# Patient Record
Sex: Male | Born: 1960 | Race: Black or African American | Hispanic: No | Marital: Single | Smoking: Former smoker
Health system: Southern US, Community
[De-identification: ages and names within clinical notes are randomized; demographics above are authoritative.]

## PROBLEM LIST (undated history)

## (undated) DIAGNOSIS — Z8719 Personal history of other diseases of the digestive system: Secondary | ICD-10-CM

## (undated) DIAGNOSIS — E663 Overweight: Secondary | ICD-10-CM

## (undated) DIAGNOSIS — T7840XA Allergy, unspecified, initial encounter: Secondary | ICD-10-CM

## (undated) DIAGNOSIS — M109 Gout, unspecified: Secondary | ICD-10-CM

## (undated) DIAGNOSIS — I2699 Other pulmonary embolism without acute cor pulmonale: Secondary | ICD-10-CM

## (undated) DIAGNOSIS — M1990 Unspecified osteoarthritis, unspecified site: Secondary | ICD-10-CM

## (undated) DIAGNOSIS — I82409 Acute embolism and thrombosis of unspecified deep veins of unspecified lower extremity: Secondary | ICD-10-CM

## (undated) DIAGNOSIS — K5792 Diverticulitis of intestine, part unspecified, without perforation or abscess without bleeding: Secondary | ICD-10-CM

## (undated) HISTORY — PX: HEMORRHOID SURGERY: SHX153

## (undated) HISTORY — DX: Diverticulitis of intestine, part unspecified, without perforation or abscess without bleeding: K57.92

## (undated) HISTORY — DX: Unspecified osteoarthritis, unspecified site: M19.90

## (undated) HISTORY — DX: Allergy, unspecified, initial encounter: T78.40XA

## (undated) HISTORY — DX: Gout, unspecified: M10.9

## (undated) HISTORY — PX: FOOT ARTHROPLASTY: SHX1657

## (undated) HISTORY — PX: OTHER SURGICAL HISTORY: SHX169

---
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
## 1998-06-25 ENCOUNTER — Emergency Department (HOSPITAL_COMMUNITY): Admission: EM | Admit: 1998-06-25 | Discharge: 1998-06-25 | Payer: Self-pay | Admitting: Emergency Medicine

## 1999-01-12 ENCOUNTER — Emergency Department (HOSPITAL_COMMUNITY): Admission: EM | Admit: 1999-01-12 | Discharge: 1999-01-12 | Payer: Self-pay

## 2001-11-07 ENCOUNTER — Emergency Department (HOSPITAL_COMMUNITY): Admission: EM | Admit: 2001-11-07 | Discharge: 2001-11-07 | Payer: Self-pay | Admitting: Emergency Medicine

## 2002-01-30 ENCOUNTER — Encounter: Payer: Self-pay | Admitting: Emergency Medicine

## 2002-01-30 ENCOUNTER — Emergency Department (HOSPITAL_COMMUNITY): Admission: EM | Admit: 2002-01-30 | Discharge: 2002-01-30 | Payer: Self-pay | Admitting: Emergency Medicine

## 2005-07-27 ENCOUNTER — Emergency Department (HOSPITAL_COMMUNITY): Admission: EM | Admit: 2005-07-27 | Discharge: 2005-07-27 | Payer: Self-pay | Admitting: Emergency Medicine

## 2005-09-01 ENCOUNTER — Emergency Department (HOSPITAL_COMMUNITY): Admission: EM | Admit: 2005-09-01 | Discharge: 2005-09-01 | Payer: Self-pay | Admitting: Emergency Medicine

## 2005-10-01 DIAGNOSIS — M109 Gout, unspecified: Secondary | ICD-10-CM

## 2005-10-01 HISTORY — DX: Gout, unspecified: M10.9

## 2010-10-01 DIAGNOSIS — K5792 Diverticulitis of intestine, part unspecified, without perforation or abscess without bleeding: Secondary | ICD-10-CM

## 2010-10-01 HISTORY — DX: Diverticulitis of intestine, part unspecified, without perforation or abscess without bleeding: K57.92

## 2010-10-14 ENCOUNTER — Emergency Department (HOSPITAL_COMMUNITY)
Admission: EM | Admit: 2010-10-14 | Discharge: 2010-10-14 | Payer: Self-pay | Source: Home / Self Care | Admitting: Emergency Medicine

## 2011-05-19 ENCOUNTER — Emergency Department (HOSPITAL_COMMUNITY)
Admission: EM | Admit: 2011-05-19 | Discharge: 2011-05-19 | Disposition: A | Discharge status: Home / Self Care | Payer: Self-pay | Attending: Emergency Medicine | Admitting: Emergency Medicine

## 2011-05-19 DIAGNOSIS — R21 Rash and other nonspecific skin eruption: Secondary | ICD-10-CM | POA: Insufficient documentation

## 2011-09-17 ENCOUNTER — Other Ambulatory Visit: Payer: Self-pay

## 2011-09-17 ENCOUNTER — Emergency Department (HOSPITAL_COMMUNITY): Payer: Self-pay

## 2011-09-17 ENCOUNTER — Inpatient Hospital Stay (HOSPITAL_COMMUNITY)
Admission: EM | Admit: 2011-09-17 | Discharge: 2011-09-21 | DRG: 392 | Disposition: A | Discharge status: Home / Self Care | Payer: Self-pay | Attending: General Surgery | Admitting: General Surgery

## 2011-09-17 ENCOUNTER — Encounter: Payer: Self-pay | Admitting: Emergency Medicine

## 2011-09-17 DIAGNOSIS — M109 Gout, unspecified: Secondary | ICD-10-CM | POA: Diagnosis present

## 2011-09-17 DIAGNOSIS — K219 Gastro-esophageal reflux disease without esophagitis: Secondary | ICD-10-CM

## 2011-09-17 DIAGNOSIS — K5732 Diverticulitis of large intestine without perforation or abscess without bleeding: Secondary | ICD-10-CM

## 2011-09-17 DIAGNOSIS — E790 Hyperuricemia without signs of inflammatory arthritis and tophaceous disease: Secondary | ICD-10-CM

## 2011-09-17 DIAGNOSIS — IMO0002 Reserved for concepts with insufficient information to code with codable children: Secondary | ICD-10-CM

## 2011-09-17 DIAGNOSIS — K5720 Diverticulitis of large intestine with perforation and abscess without bleeding: Secondary | ICD-10-CM

## 2011-09-17 HISTORY — DX: Personal history of other diseases of the digestive system: Z87.19

## 2011-09-17 HISTORY — DX: Overweight: E66.3

## 2011-09-17 LAB — URINALYSIS, ROUTINE W REFLEX MICROSCOPIC
Bilirubin Urine: NEGATIVE
Glucose, UA: NEGATIVE mg/dL
Hgb urine dipstick: NEGATIVE
Ketones, ur: NEGATIVE mg/dL
Leukocytes, UA: NEGATIVE
Nitrite: NEGATIVE
Protein, ur: NEGATIVE mg/dL
Specific Gravity, Urine: 1.016 (ref 1.005–1.030)
Urobilinogen, UA: 1 mg/dL (ref 0.0–1.0)
pH: 7 (ref 5.0–8.0)

## 2011-09-17 LAB — CBC
HCT: 37.5 % — ABNORMAL LOW (ref 39.0–52.0)
HCT: 35.2 % — ABNORMAL LOW (ref 39.0–52.0)
Hemoglobin: 12.9 g/dL — ABNORMAL LOW (ref 13.0–17.0)
Hemoglobin: 12.3 g/dL — ABNORMAL LOW (ref 13.0–17.0)
MCH: 32.3 pg (ref 26.0–34.0)
MCH: 32.8 pg (ref 26.0–34.0)
MCHC: 34.4 g/dL (ref 30.0–36.0)
MCHC: 34.9 g/dL (ref 30.0–36.0)
MCV: 93.8 fL (ref 78.0–100.0)
MCV: 93.9 fL (ref 78.0–100.0)
Platelets: 284 10*3/uL (ref 150–400)
Platelets: 284 10*3/uL (ref 150–400)
RBC: 4 MIL/uL — ABNORMAL LOW (ref 4.22–5.81)
RBC: 3.75 MIL/uL — ABNORMAL LOW (ref 4.22–5.81)
RDW: 12.8 % (ref 11.5–15.5)
RDW: 12.6 % (ref 11.5–15.5)
WBC: 15.6 10*3/uL — ABNORMAL HIGH (ref 4.0–10.5)
WBC: 14.2 10*3/uL — ABNORMAL HIGH (ref 4.0–10.5)

## 2011-09-17 LAB — DIFFERENTIAL
Basophils Absolute: 0 10*3/uL (ref 0.0–0.1)
Basophils Relative: 0 % (ref 0–1)
Eosinophils Absolute: 0 10*3/uL (ref 0.0–0.7)
Eosinophils Relative: 0 % (ref 0–5)
Lymphocytes Relative: 12 % (ref 12–46)
Lymphs Abs: 1.9 10*3/uL (ref 0.7–4.0)
Monocytes Absolute: 1.6 10*3/uL — ABNORMAL HIGH (ref 0.1–1.0)
Monocytes Relative: 10 % (ref 3–12)
Neutro Abs: 12.1 10*3/uL — ABNORMAL HIGH (ref 1.7–7.7)
Neutrophils Relative %: 78 % — ABNORMAL HIGH (ref 43–77)

## 2011-09-17 LAB — PROTIME-INR
INR: 1.08 (ref 0.00–1.49)
Prothrombin Time: 14.2 seconds (ref 11.6–15.2)

## 2011-09-17 LAB — CREATININE, SERUM
Creatinine, Ser: 0.74 mg/dL (ref 0.50–1.35)
GFR calc Af Amer: >90 mL/min (ref >90)
GFR calc non Af Amer: >90 mL/min (ref >90)

## 2011-09-17 LAB — OCCULT BLOOD, POC DEVICE: Fecal Occult Bld: NEGATIVE

## 2011-09-17 LAB — BASIC METABOLIC PANEL
BUN: 11 mg/dL (ref 6–23)
CO2: 26 mEq/L (ref 19–32)
Calcium: 9.7 mg/dL (ref 8.4–10.5)
Chloride: 100 mEq/L (ref 96–112)
Creatinine, Ser: 0.9 mg/dL (ref 0.50–1.35)
GFR calc Af Amer: >90 mL/min (ref >90)
GFR calc non Af Amer: >90 mL/min (ref >90)
Glucose, Bld: 90 mg/dL (ref 70–99)
Potassium: 3.5 mEq/L (ref 3.5–5.1)
Sodium: 136 mEq/L (ref 135–145)

## 2011-09-17 LAB — LIPASE, BLOOD: Lipase: 12 U/L (ref 11–59)

## 2011-09-17 MED ORDER — MORPHINE SULFATE 2 MG/ML IJ SOLN
INTRAMUSCULAR | Status: AC
  Administered 2011-09-17: 4 mg via INTRAVENOUS
  Filled 2011-09-17: qty 2

## 2011-09-17 MED ORDER — ACETAMINOPHEN 650 MG RE SUPP: 650.0000 mg | Freq: Four times a day (QID) | RECTAL | Status: DC | PRN

## 2011-09-17 MED ORDER — ERTAPENEM SODIUM 1 G IJ SOLR
1.0000 g | INTRAMUSCULAR | Status: DC
  Administered 2011-09-17 – 2011-09-21 (×5): 1 g via INTRAVENOUS
  Filled 2011-09-17 (×5): qty 1

## 2011-09-17 MED ORDER — SODIUM CHLORIDE 0.9 % IV SOLN
INTRAVENOUS | Status: DC
  Administered 2011-09-17 – 2011-09-18 (×3): via INTRAVENOUS

## 2011-09-17 MED ORDER — MORPHINE SULFATE 4 MG/ML IJ SOLN: 4.0000 mg | Freq: Once | INTRAMUSCULAR | Status: DC

## 2011-09-17 MED ORDER — ACETAMINOPHEN 325 MG PO TABS
650.0000 mg | ORAL_TABLET | Freq: Four times a day (QID) | ORAL | Status: DC | PRN
  Administered 2011-09-17 – 2011-09-21 (×8): 650 mg via ORAL
  Filled 2011-09-17 (×8): qty 2

## 2011-09-17 MED ORDER — HYDROMORPHONE HCL PF 1 MG/ML IJ SOLN
1.0000 mg | Freq: Once | INTRAMUSCULAR | Status: AC
  Administered 2011-09-17: 1 mg via INTRAVENOUS
  Filled 2011-09-17: qty 1

## 2011-09-17 MED ORDER — DIPHENHYDRAMINE HCL 50 MG/ML IJ SOLN
25.0000 mg | Freq: Four times a day (QID) | INTRAMUSCULAR | Status: DC | PRN
  Administered 2011-09-17 – 2011-09-21 (×4): 25 mg via INTRAVENOUS
  Filled 2011-09-17 (×4): qty 1

## 2011-09-17 MED ORDER — IOHEXOL 300 MG/ML  SOLN
25.0000 mL | Freq: Once | INTRAMUSCULAR | Status: AC | PRN
  Administered 2011-09-17: 25 mL via INTRAVENOUS

## 2011-09-17 MED ORDER — ERTAPENEM SODIUM 1 G IJ SOLR: 1.0000 g | INTRAMUSCULAR | Status: DC

## 2011-09-17 MED ORDER — IOHEXOL 300 MG/ML  SOLN
100.0000 mL | Freq: Once | INTRAMUSCULAR | Status: AC | PRN
  Administered 2011-09-17: 100 mL via INTRAVENOUS

## 2011-09-17 MED ORDER — HYDROMORPHONE HCL PF 1 MG/ML IJ SOLN
0.5000 mg | INTRAMUSCULAR | Status: DC | PRN
  Administered 2011-09-17 – 2011-09-20 (×13): 1 mg via INTRAVENOUS
  Filled 2011-09-17 (×13): qty 1

## 2011-09-17 MED ORDER — ONDANSETRON HCL 4 MG/2ML IJ SOLN: 4.0000 mg | Freq: Four times a day (QID) | INTRAMUSCULAR | Status: DC | PRN

## 2011-09-17 MED ORDER — KCL IN DEXTROSE-NACL 20-5-0.45 MEQ/L-%-% IV SOLN
INTRAVENOUS | Status: DC
  Administered 2011-09-17 – 2011-09-21 (×8): via INTRAVENOUS
  Filled 2011-09-17 (×12): qty 1000

## 2011-09-17 MED ORDER — PANTOPRAZOLE SODIUM 40 MG IV SOLR
40.0000 mg | Freq: Every day | INTRAVENOUS | Status: DC
  Administered 2011-09-17 – 2011-09-20 (×4): 40 mg via INTRAVENOUS
  Filled 2011-09-17 (×7): qty 40

## 2011-09-17 MED ORDER — ENOXAPARIN SODIUM 40 MG/0.4ML ~~LOC~~ SOLN
40.0000 mg | SUBCUTANEOUS | Status: DC
  Administered 2011-09-17 – 2011-09-21 (×5): 40 mg via SUBCUTANEOUS
  Filled 2011-09-17 (×6): qty 0.4

## 2011-09-17 NOTE — H&P (Signed)
Alexander [name] is an 50 y.o. male.  Primary care physician: None.  Chief Complaint: Abdominal pain HPI: Patient is a 50 year old African American male who was in his normal state of health up until 2-3 days ago. He started having abdominal pain is located on the left lateral side left upper quadrant. She attributed to flulike symptoms. This become progressively worse and he presented to the ER at [facility] for evaluation today. Pain is well and left side is no worse with oral intake. He ate last night, he also had his last bowel movement last night. He denies nausea or vomiting. He does not have a history of constipation or diarrhea. In the emergency room he had an elevated temperature up to 100.1. White count was elevated at 15.6. CT scan shows findings compatible with diverticulitis involving the mid and distal aspect of the descending colon. There is extensive mesenteric stranding and fluid without defined abscess. There was air in the area suggestive of a perforation.  Dr.Gerkin review the study and could not be determined for certain whether was actually perforated or the air was within the bowel. We were asked to see in plan to admit for medical management at this time.  Past Medical History  Diagnosis Date  . Gout   . History of gastroesophageal reflux (GERD)   . Overweight (BMI 25.0-29.9)     Past Surgical History  Procedure Date  . None    home meds: He takes Zantac about 3 times per week for GERD symptoms. No other medications.  History reviewed. No pertinent family history. Social History:  reports that he has quit smoking. His smoking use included Cigarettes. He smoked 1 pack per day. He does not have any smokeless tobacco history on file. He reports that he drinks alcohol. He reports that he does not use illicit drugs.  Allergies: No Known Allergies  Medications Prior to Admission  Medication Dose Route Frequency Provider Last Rate Last Dose  . 0.9 %  sodium chloride  infusion   Intravenous Continuous Fayrene Helper, PA 125 mL/hr at 09/17/11 1117    . ertapenem (INVANZ) 1 g in sodium chloride 0.9 % 50 mL IVPB  1 g Intravenous Q24H Fayrene Helper, PA      . HYDROmorphone (DILAUDID) injection 1 mg  1 mg Intravenous Once Fayrene Helper, PA   1 mg at 09/17/11 1011  . iohexol (OMNIPAQUE) 300 MG/ML solution 100 mL  100 mL Intravenous Once PRN Medication Radiologist   100 mL at 09/17/11 0920  . iohexol (OMNIPAQUE) 300 MG/ML solution 25 mL  25 mL Intravenous Once PRN Medication Radiologist   25 mL at 09/17/11 0921  . morphine 2 MG/ML injection        4 mg at 09/17/11 0805  . morphine 4 MG/ML injection 4 mg  4 mg Intravenous Once Fayrene Helper, PA       No current outpatient prescriptions on file as of 09/17/2011.    Results for orders placed during the hospital encounter of 09/17/11 (from the past 48 hour(s))  OCCULT BLOOD, POC DEVICE     Status: Normal   Collection Time   09/17/11  7:19 AM      Component Value Range Comment   Fecal Occult Bld NEGATIVE     CBC     Status: Abnormal   Collection Time   09/17/11  7:20 AM      Component Value Range Comment   WBC 15.6 (*) 4.0 - 10.5 (K/uL) WHITE COUNT CONFIRMED  ON SMEAR   RBC 4.00 (*) 4.22 - 5.81 (MIL/uL)    Hemoglobin 12.9 (*) 13.0 - 17.0 (g/dL)    HCT 16.1 (*) 09.6 - 52.0 (%)    MCV 93.8  78.0 - 100.0 (fL)    MCH 32.3  26.0 - 34.0 (pg)    MCHC 34.4  30.0 - 36.0 (g/dL)    RDW 04.5  40.9 - 81.1 (%)    Platelets 284  150 - 400 (K/uL)   DIFFERENTIAL     Status: Abnormal   Collection Time   09/17/11  7:20 AM      Component Value Range Comment   Neutrophils Relative 78 (*) 43 - 77 (%)    Lymphocytes Relative 12  12 - 46 (%)    Monocytes Relative 10  3 - 12 (%)    Eosinophils Relative 0  0 - 5 (%)    Basophils Relative 0  0 - 1 (%)    Neutro Abs 12.1 (*) 1.7 - 7.7 (K/uL)    Lymphs Abs 1.9  0.7 - 4.0 (K/uL)    Monocytes Absolute 1.6 (*) 0.1 - 1.0 (K/uL)    Eosinophils Absolute 0.0  0.0 - 0.7 (K/uL)    Basophils Absolute  0.0  0.0 - 0.1 (K/uL)    Smear Review MORPHOLOGY UNREMARKABLE     BASIC METABOLIC PANEL     Status: Normal   Collection Time   09/17/11  7:20 AM      Component Value Range Comment   Sodium 136  135 - 145 (mEq/L)    Potassium 3.5  3.5 - 5.1 (mEq/L)    Chloride 100  96 - 112 (mEq/L)    CO2 26  19 - 32 (mEq/L)    Glucose, Bld 90  70 - 99 (mg/dL)    BUN 11  6 - 23 (mg/dL)    Creatinine, Ser 9.14  0.50 - 1.35 (mg/dL)    Calcium 9.7  8.4 - 10.5 (mg/dL)    GFR calc non Af Amer >90  >90 (mL/min)    GFR calc Af Amer >90  >90 (mL/min)   URINALYSIS, ROUTINE W REFLEX MICROSCOPIC     Status: Normal   Collection Time   09/17/11  9:58 AM      Component Value Range Comment   Color, Urine YELLOW  YELLOW     APPearance CLEAR  CLEAR     Specific Gravity, Urine 1.016  1.005 - 1.030     pH 7.0  5.0 - 8.0     Glucose, UA NEGATIVE  NEGATIVE (mg/dL)    Hgb urine dipstick NEGATIVE  NEGATIVE     Bilirubin Urine NEGATIVE  NEGATIVE     Ketones, ur NEGATIVE  NEGATIVE (mg/dL)    Protein, ur NEGATIVE  NEGATIVE (mg/dL)    Urobilinogen, UA 1.0  0.0 - 1.0 (mg/dL)    Nitrite NEGATIVE  NEGATIVE     Leukocytes, UA NEGATIVE  NEGATIVE  MICROSCOPIC NOT DONE ON URINES WITH NEGATIVE PROTEIN, BLOOD, LEUKOCYTES, NITRITE, OR GLUCOSE <1000 mg/dL.  PROTIME-INR     Status: Normal   Collection Time   09/17/11 10:35 AM      Component Value Range Comment   Prothrombin Time 14.2  11.6 - 15.2 (seconds)    INR 1.08  0.00 - 1.49     Dg Chest 2 View  09/17/2011  *RADIOLOGY REPORT*  Clinical Data: Cough, shortness of breath  CHEST - 2 VIEW  Comparison: None.  Findings: Cardiomediastinal silhouette is unremarkable.  Bony structures are unremarkable.  No acute infiltrate or pleural effusion.  No pulmonary edema.  Probable linear atelectasis or scarring right midlung.  IMPRESSION: No acute infiltrate or pulmonary edema.  Probable linear atelectasis or scarring right midlung.  Original Report Authenticated By: Natasha Mead, M.D.   Ct  Abdomen Pelvis W Contrast  09/17/2011  *RADIOLOGY REPORT*  Clinical Data: Left lower quadrant abdominal pain  CT ABDOMEN AND PELVIS WITH CONTRAST  Technique:  Multidetector CT imaging of the abdomen and pelvis was performed following the standard protocol during bolus administration of intravenous contrast.  Contrast: OMNIPAQUE IOHEXOL 300 MG/ML IV SOLN, 25mL OMNIPAQUE IOHEXOL 300 MG/ML IV SOLN  Comparison: None.  Findings:  Normal hepatic contour.  No discrete hepatic lesions.  Normal gallbladder.  No intra or extrahepatic biliary ductal dilatation. Incidental note is made of a small fluid collection inferior to the right lobe of the liver (coronal image 35, series 4 and axial image 35, series 2).  This fluid collection is without peripheral enhancement.  There is symmetric enhancement and excretion of the bilateral kidneys.  No discrete renal lesion.  No urinary obstruction.  The bilateral adrenal glands, pancreas and spleen are normal.  There is extensive mesenteric stranding adjacent to the anterior aspect of the mid descending colon (image 59, series 2) which is associated with multiple foci of extraluminal air compatible with perforated diverticulitis.  There is minimal peripheral enhancement about the adjacent fluid collection without a well-defined abscess. The inflammation secondarily involves an adjacent loop of small bowel which is thick-walled.  Scattered diverticuli are seen within the cecum and ascending colon.  Normal appendix.  The proximal small bowel is mildly dilated but there is no evidence of obstruction.  No portal venous gas.  Normal caliber abdominal aorta.  The major branch vessels of the abdominal aorta, including the IMA are patent.  No retroperitoneal, shoddy mesenteric lymph nodes with a index noted measuring 9 mm in diameter (image 45) are favored to be reactive  in etiology.  No retroperitoneal, mesenteric, pelvic or inguinal lymphadenopathy.  Limited visualization of the lower  thorax demonstrates bibasilar atelectasis.  No focal airspace opacities.  No pleural effusion. Normal heart size.  No pericardial effusion.  No acute or aggressive osseous abnormalities.  L5 - S1 degenerative change.  Note is made of small bilateral mesenteric fat containing inguinal hernias.  IMPRESSION: 1.  Findings compatible with a perforated diverticulitis involving the mid/distal aspect of the descending colon.  There is expected extensive adjacent mesenteric stranding and fluid without a well- defined abscess.  2.  Inflammatory change secondarily involves an adjacent loop of small bowel, however there is no evidence of enteric obstruction.  Above findings discussed with Dr. Laveda Norman at [phone].  Original Report Authenticated By: Waynard Reeds, M.D.    Review of Systems  Constitutional: Positive for fever and chills. Negative for weight loss.       Thought he had the flu.  HENT: Negative.   Eyes: Negative.   Respiratory: Negative for cough, hemoptysis, sputum production, shortness of breath and wheezing.   Cardiovascular: Negative.   Gastrointestinal: Positive for heartburn (occasional takes zantac about 3 times per week).  Musculoskeletal: Negative.        Hx. Of gout great toes and ankles.  Skin: Negative.   Neurological: Negative.   Endo/Heme/Allergies: Negative.   Psychiatric/Behavioral: Negative.     Blood pressure 135/89, pulse 110, temperature 99 F (37.2 C), temperature source Oral, resp. rate 20, SpO2 100.00%. Physical  Exam  Constitutional: He is oriented to person, place, and time.  Eyes: Conjunctivae are normal. Pupils are equal, round, and reactive to light.  Neck: Normal range of motion. Neck supple. No JVD present. No tracheal deviation present. No thyromegaly present.  Cardiovascular: Normal rate, regular rhythm and normal heart sounds.        Sl Tachycardic after sitting up.  Respiratory: Effort normal and breath sounds normal. No respiratory distress. He has no wheezes.  He has no rales.  GI: He exhibits no mass. Distention: mild. There is tenderness (LUQ lateral aspect.). There is no rebound and no guarding.  Musculoskeletal: He exhibits no edema.  Lymphadenopathy:    He has no cervical adenopathy.  Neurological: He is alert and oriented to person, place, and time. He has normal reflexes. No cranial nerve deficit.  Skin: Skin is warm and dry.  Psychiatric: He has a normal mood and affect. His behavior is normal. Judgment and thought content normal.     Assessment/Plan 1. Diverticulitis with probable perforation. 2. History of gout. 3. History of GERD 4. BMI of 29. Plan: We will admit the patient. He be placed on bowel rest with just sips of clear liquids today, undergo hydration. Antibiotic therapy starting with Invanz. Further workup and evaluation as needed. [facility] physician assistant for Dr. Darnell Level.  Mileena Rothenberger 09/17/2011, 12:08 PM

## 2011-09-17 NOTE — ED Provider Notes (Signed)
History    this is a 50 year old gentleman presenting to the ED with chief complaints of abdominal pain. For the past 2 days he has been experiencing intermittent pain to his left lower quadrant. Pain is described as sharp, bloating, and worsening with coughing, bending, or movement. Pain at times is significant, causing him to be diaphoresis.  His last move bowel movement was last night, and he is able to pass flatus. Patient state his cough is occasional, and nonproductive.  He denies headache, sore throat, sneezing, runny nose, neck pain, chest pain, shortness of breath, back pain, dysuria, rash, weakness or numbness. He denies blood per rectum, black stool, or prior abdominal surgery.  CSN: [id number] Arrival date & time: 09/17/2011  6:38 AM   First MD Initiated Contact with Patient 09/17/11 [phone]      Chief Complaint  Patient presents with  . Cough    flu-like symptoms    (Consider location/radiation/quality/duration/timing/severity/associated sxs/prior treatment) HPI  History reviewed. No pertinent past medical history.  History reviewed. No pertinent past surgical history.  History reviewed. No pertinent family history.  History  Substance Use Topics  . Smoking status: Former Games developer  . Smokeless tobacco: Not on file  . Alcohol Use: Yes     occassional      Review of Systems  All other systems reviewed and are negative.    Allergies  Review of patient's allergies indicates no known allergies.  Home Medications  No current outpatient prescriptions on file.  BP 144/66  Pulse 122  Temp(Src) 100.1 F (37.8 C) (Oral)  Resp 20  SpO2 98%  Physical Exam  Nursing note and vitals reviewed. Constitutional: He appears well-developed and well-nourished. No distress.       Awake, alert, nontoxic appearance  HENT:  Head: Atraumatic.  Eyes: Right eye exhibits no discharge. Left eye exhibits no discharge.  Neck: Neck supple.  Cardiovascular: Normal rate and regular  rhythm.   Pulmonary/Chest: Effort normal. No respiratory distress. He has no wheezes. He has no rales. He exhibits no tenderness.  Abdominal: Soft. There is tenderness in the left lower quadrant. There is no rebound, no guarding, no CVA tenderness, no tenderness at McBurney's point and negative Murphy's sign. No hernia. Hernia confirmed negative in the ventral area, confirmed negative in the right inguinal area and confirmed negative in the left inguinal area.  Genitourinary: Rectum normal. Rectal exam shows anal tone normal. Guaiac negative stool.  Musculoskeletal: He exhibits no tenderness.       Baseline ROM, no obvious new focal weakness  Neurological:       Mental status and motor strength appears baseline for patient and situation  Skin: No rash noted.  Psychiatric: He has a normal mood and affect.    ED Course  Procedures (including critical care time)  Labs Reviewed - No data to display No results found.   No diagnosis found.   Date: 09/17/2011  Rate: 109  Rhythm: sinus tachycardia  QRS Axis: normal  Intervals: normal  ST/T Wave abnormalities: normal  Conduction Disutrbances:none  Narrative Interpretation: early precordial R/S transition.  Probably L atrial abnormality  Old EKG Reviewed: none available    MDM  Patient's primary complaint is left lower quadrant abdominal pain. Although indicated on the nursing note, he has no URI symptoms. On examination, abdomen is tender to left lower quadrant with no guarding or rebound tenderness. No evidence of hernia. No evidence of rectal bleeding.  9:49 AM Radiologist notified me that the patient has what appears  to be a perforated diverticulitis.  Will consult general surgery for consultation.  Pt currently in pain but in no acute distress.    10:35 AM I have consult with surgery who agrees to see patient in the ED. Surgeon also requests for patient to be started on Invanz 1 g every 24 hour. Patient is now in no acute  distress.      Fayrene Helper, PA 09/17/11 1053  Fayrene Helper, [name] 09/17/11 1354

## 2011-09-17 NOTE — ED Notes (Signed)
Pt states Saturday he began feeling bad while at work. Pt c/o cough, sweating, and aches.  Pt states his side hurts when he coughs or bends over.  Pt also c/o sore throath. Pt has taken thermaflu without relief.

## 2011-09-17 NOTE — ED Notes (Signed)
Placed patient on bedside cardiac monitor. Applied 2L O2 via nasal cannula secondary to O2 sats 92%.

## 2011-09-17 NOTE — Progress Notes (Signed)
ED CM noted pt self pay, no insurance coverage. Spoke with pt who confirms  self pay, no insurance coverage and [employer] resident.  Reviewed importance of pcp for follow up care, [facility], [facility] and urgent care centers. Has never been seen by any MD for GI issues.  All questions answered.  Provided written information on resources discussed.  Pt to notify CM/MD of MD choice of pcp.  Male and male family members present as CM reviewed information.

## 2011-09-17 NOTE — H&P (Signed)
Attending: Patient seen and evaluated in ER.  IV Invanz just now being administered by ER nurse.  Awaiting room.  Discussed diverticular disease and options for management.  Discussed possible need for OR and colostomy.  Patient and family understand.  Agree with H&P per Zola Button. Velora Heckler, MD, FACS General & Endocrine Surgery [facility]

## 2011-09-18 LAB — COMPREHENSIVE METABOLIC PANEL
ALT: 17 U/L (ref 0–53)
AST: 12 U/L (ref 0–37)
Albumin: 2.9 g/dL — ABNORMAL LOW (ref 3.5–5.2)
Alkaline Phosphatase: 65 U/L (ref 39–117)
BUN: 9 mg/dL (ref 6–23)
CO2: 25 mEq/L (ref 19–32)
Calcium: 9.2 mg/dL (ref 8.4–10.5)
Chloride: 101 mEq/L (ref 96–112)
Creatinine, Ser: 0.77 mg/dL (ref 0.50–1.35)
GFR calc Af Amer: >90 mL/min (ref >90)
GFR calc non Af Amer: >90 mL/min (ref >90)
Glucose, Bld: 117 mg/dL — ABNORMAL HIGH (ref 70–99)
Potassium: 3.5 mEq/L (ref 3.5–5.1)
Sodium: 135 mEq/L (ref 135–145)
Total Bilirubin: 0.4 mg/dL (ref 0.3–1.2)
Total Protein: 7.2 g/dL (ref 6.0–8.3)

## 2011-09-18 LAB — LIPASE, BLOOD: Lipase: 11 U/L (ref 11–59)

## 2011-09-18 LAB — CBC
HCT: 35.9 % — ABNORMAL LOW (ref 39.0–52.0)
Hemoglobin: 12.3 g/dL — ABNORMAL LOW (ref 13.0–17.0)
MCH: 32.4 pg (ref 26.0–34.0)
MCHC: 34.3 g/dL (ref 30.0–36.0)
MCV: 94.5 fL (ref 78.0–100.0)
Platelets: 282 10*3/uL (ref 150–400)
RBC: 3.8 MIL/uL — ABNORMAL LOW (ref 4.22–5.81)
RDW: 12.8 % (ref 11.5–15.5)
WBC: 13 10*3/uL — ABNORMAL HIGH (ref 4.0–10.5)

## 2011-09-18 LAB — URIC ACID: Uric Acid, Serum: 5.6 mg/dL (ref 4.0–7.8)

## 2011-09-18 MED ORDER — DIPHENHYDRAMINE-ZINC ACETATE 2-0.1 % EX CREA
TOPICAL_CREAM | CUTANEOUS | Status: DC | PRN
  Filled 2011-09-18: qty 28.4

## 2011-09-18 MED ORDER — COLCHICINE 0.6 MG PO TABS
0.6000 mg | ORAL_TABLET | Freq: Two times a day (BID) | ORAL | Status: DC
  Administered 2011-09-18 – 2011-09-21 (×7): 0.6 mg via ORAL
  Filled 2011-09-18 (×10): qty 1

## 2011-09-18 MED ORDER — DIPHENHYDRAMINE HCL 25 MG PO CAPS
25.0000 mg | ORAL_CAPSULE | Freq: Four times a day (QID) | ORAL | Status: DC | PRN
  Administered 2011-09-19 – 2011-09-20 (×2): 25 mg via ORAL
  Filled 2011-09-18 (×2): qty 1

## 2011-09-18 MED ORDER — INDOMETHACIN 50 MG PO CAPS
50.0000 mg | ORAL_CAPSULE | Freq: Two times a day (BID) | ORAL | Status: DC | PRN
  Administered 2011-09-18 – 2011-09-20 (×5): 50 mg via ORAL
  Filled 2011-09-18 (×5): qty 1

## 2011-09-18 NOTE — Progress Notes (Signed)
Attending: Pt improved with less pain, more mobile.  Complains of Gout in left ankle.  Will begin Rx for Gout.  Continue IV Invanz.  Sips clear liquids.  Follow labs.  Likely will repeat CT abd & pelvis in 3 days.  Pt understands and agrees with plans. Velora Heckler, MD, FACS General & Endocrine Surgery [facility]

## 2011-09-18 NOTE — Progress Notes (Signed)
Subjective: Pain is better today.  His gout has flared L great toe.  Objective: Vital signs in last 24 hours: Temp:  [98.4 F (36.9 C)-100.5 F (38.1 C)] 98.4 F (36.9 C) (12/18 0600) Pulse Rate:  [95-110] 95  (12/18 0600) Resp:  [18-26] 18  (12/18 0600) BP: (119-136)/(67-89) 129/75 mmHg (12/18 0600) SpO2:  [93 %-100 %] 97 % (12/18 0600) Weight:  [92.987 kg (205 lb)] 205 lb (92.987 kg) (12/17 1825) Last BM Date: 09/16/11  Intake/Output from previous day: 12/17 0701 - 12/18 0700 In: 1200 [I.V.:1200] Out: 775 [Urine:775] Intake/Output this shift:    General appearance: alert, cooperative and no distress GI: soft, non-tender; bowel sounds normal; no masses,  no organomegaly L great toe gout.  Lab Results:   [facility] 09/18/11 0335 09/17/11 1429  WBC 13.0* 14.2*  HGB 12.3* 12.3*  HCT 35.9* 35.2*  PLT 282 284    BMET  Basename 09/18/11 0335 09/17/11 1429 09/17/11 0720  NA 135 -- 136  K 3.5 -- 3.5  CL 101 -- 100  CO2 25 -- 26  GLUCOSE 117* -- 90  BUN 9 -- 11  CREATININE 0.77 0.74 --  CALCIUM 9.2 -- 9.7   PT/INR  Basename 09/17/11 1035  LABPROT 14.2  INR 1.08     Studies/Results: Dg Chest 2 View  09/17/2011  *RADIOLOGY REPORT*  Clinical Data: Cough, shortness of breath  CHEST - 2 VIEW  Comparison: None.  Findings: Cardiomediastinal silhouette is unremarkable.  Bony structures are unremarkable.  No acute infiltrate or pleural effusion.  No pulmonary edema.  Probable linear atelectasis or scarring right midlung.  IMPRESSION: No acute infiltrate or pulmonary edema.  Probable linear atelectasis or scarring right midlung.  Original Report Authenticated By: Natasha Mead, M.D.   Ct Abdomen Pelvis W Contrast  09/17/2011  *RADIOLOGY REPORT*  Clinical Data: Left lower quadrant abdominal pain  CT ABDOMEN AND PELVIS WITH CONTRAST  Technique:  Multidetector CT imaging of the abdomen and pelvis was performed following the standard protocol during bolus administration of  intravenous contrast.  Contrast: OMNIPAQUE IOHEXOL 300 MG/ML IV SOLN, 25mL OMNIPAQUE IOHEXOL 300 MG/ML IV SOLN  Comparison: None.  Findings:  Normal hepatic contour.  No discrete hepatic lesions.  Normal gallbladder.  No intra or extrahepatic biliary ductal dilatation. Incidental note is made of a small fluid collection inferior to the right lobe of the liver (coronal image 35, series 4 and axial image 35, series 2).  This fluid collection is without peripheral enhancement.  There is symmetric enhancement and excretion of the bilateral kidneys.  No discrete renal lesion.  No urinary obstruction.  The bilateral adrenal glands, pancreas and spleen are normal.  There is extensive mesenteric stranding adjacent to the anterior aspect of the mid descending colon (image 59, series 2) which is associated with multiple foci of extraluminal air compatible with perforated diverticulitis.  There is minimal peripheral enhancement about the adjacent fluid collection without a well-defined abscess. The inflammation secondarily involves an adjacent loop of small bowel which is thick-walled.  Scattered diverticuli are seen within the cecum and ascending colon.  Normal appendix.  The proximal small bowel is mildly dilated but there is no evidence of obstruction.  No portal venous gas.  Normal caliber abdominal aorta.  The major branch vessels of the abdominal aorta, including the IMA are patent.  No retroperitoneal, shoddy mesenteric lymph nodes with a index noted measuring 9 mm in diameter (image 45) are favored to be reactive  in etiology.  No  retroperitoneal, mesenteric, pelvic or inguinal lymphadenopathy.  Limited visualization of the lower thorax demonstrates bibasilar atelectasis.  No focal airspace opacities.  No pleural effusion. Normal heart size.  No pericardial effusion.  No acute or aggressive osseous abnormalities.  L5 - S1 degenerative change.  Note is made of small bilateral mesenteric fat containing inguinal  hernias.  IMPRESSION: 1.  Findings compatible with a perforated diverticulitis involving the mid/distal aspect of the descending colon.  There is expected extensive adjacent mesenteric stranding and fluid without a well- defined abscess.  2.  Inflammatory change secondarily involves an adjacent loop of small bowel, however there is no evidence of enteric obstruction.  Above findings discussed with Dr. Laveda Norman at [phone].  Original Report Authenticated By: Waynard Reeds, M.D.    Anti-infectives: Anti-infectives     Start     Dose/Rate Route Frequency Ordered Stop   09/17/11 1500   ertapenem (INVANZ) 1 g in sodium chloride 0.9 % 50 mL IVPB        1 g 100 mL/hr over 30 Minutes Intravenous Every 24 hours 09/17/11 1348     09/17/11 1045   ertapenem (INVANZ) 1 g in sodium chloride 0.9 % 50 mL IVPB  Status:  Discontinued        1 g 100 mL/hr over 30 Minutes Intravenous Every 24 hours 09/17/11 1035 09/17/11 1220         Current Facility-Administered Medications  Medication Dose Route Frequency Provider Last Rate Last Dose  . 0.9 %  sodium chloride infusion   Intravenous Continuous Fayrene Helper, PA 125 mL/hr at 09/17/11 1117    . acetaminophen (TYLENOL) tablet 650 mg  650 mg Oral Q6H PRN Sherrie George, PA   650 mg at 09/17/11 1456   Or  . acetaminophen (TYLENOL) suppository 650 mg  650 mg Rectal Q6H PRN Sherrie George, PA      . dextrose 5 % and 0.45 % NaCl with KCl 20 mEq/L infusion   Intravenous Continuous Sherrie George, PA 100 mL/hr at 09/17/11 1925    . diphenhydrAMINE (BENADRYL) injection 25 mg  25 mg Intravenous Q6H PRN Ardeth Sportsman, MD   25 mg at 09/17/11 2232  . enoxaparin (LOVENOX) injection 40 mg  40 mg Subcutaneous Q24H Sherrie George, PA   40 mg at 09/17/11 1627  . ertapenem (INVANZ) 1 g in sodium chloride 0.9 % 50 mL IVPB  1 g Intravenous Q24H Sherrie George, PA   1 g at 09/17/11 1626  . HYDROmorphone (DILAUDID) injection 0.5-1 mg  0.5-1 mg Intravenous Q1H PRN Sherrie George, PA   1 mg at 09/18/11 0533  . HYDROmorphone (DILAUDID) injection 1 mg  1 mg Intravenous Once Fayrene Helper, PA   1 mg at 09/17/11 1011  . HYDROmorphone (DILAUDID) injection 1 mg  1 mg Intravenous Once Fayrene Helper, PA   1 mg at 09/17/11 1243  . morphine 4 MG/ML injection 4 mg  4 mg Intravenous Once Fayrene Helper, PA      . ondansetron [facility]) injection 4 mg  4 mg Intravenous Q6H PRN Sherrie George, PA      . pantoprazole (PROTONIX) injection 40 mg  40 mg Intravenous QHS Sherrie George, [name]   40 mg at 09/17/11 2228  . DISCONTD: ertapenem (INVANZ) 1 g in sodium chloride 0.9 % 50 mL IVPB  1 g Intravenous Q24H Fayrene Helper, PA        Assessment/Plan Diverticulitis with perforation Gout  GerD BMi 29 Plan: continue antibiotics, treat gout.  Start some clears.     LOS: 1 day    Mylia Pondexter 09/18/2011

## 2011-09-18 NOTE — ED Provider Notes (Signed)
Medical screening examination/treatment/procedure(s) were performed by non-physician practitioner and as supervising physician I was immediately available for consultation/collaboration.   Kendelle Schweers A Lina Hitch, MD 09/18/11 1704 

## 2011-09-18 NOTE — Progress Notes (Signed)
Pt stated IV was started early in AM on 09/18/11; RN noticed during shift assessment of IV site that cannula/ catheter tubing had become dislodged and was sticking out. RN observed infiltration around IV site, d/c'ed IV site immediately and gave pt warm compress to aid with swelling and inflammation. Restarted IV and pt tolerated well. Marcelino Duster, RN

## 2011-09-19 LAB — BASIC METABOLIC PANEL
BUN: 9 mg/dL (ref 6–23)
CO2: 26 mEq/L (ref 19–32)
Calcium: 9 mg/dL (ref 8.4–10.5)
Chloride: 100 mEq/L (ref 96–112)
Creatinine, Ser: 0.73 mg/dL (ref 0.50–1.35)
GFR calc Af Amer: >90 mL/min (ref >90)
GFR calc non Af Amer: >90 mL/min (ref >90)
Glucose, Bld: 112 mg/dL — ABNORMAL HIGH (ref 70–99)
Potassium: 3.6 mEq/L (ref 3.5–5.1)
Sodium: 135 mEq/L (ref 135–145)

## 2011-09-19 NOTE — Progress Notes (Signed)
Subjective: He continues to improve clinically.  His gout is also better.  Objective: Vital signs in last 24 hours: Temp:  [97.9 F (36.6 C)-99.3 F (37.4 C)] 98.3 F (36.8 C) (12/19 0500) Pulse Rate:  [84-99] 84  (12/19 0500) Resp:  [18] 18  (12/19 0500) BP: (136-152)/(79-96) 139/83 mmHg (12/19 0500) SpO2:  [94 %-97 %] 97 % (12/19 0500) Last BM Date: 09/18/11  Intake/Output from previous day: 12/18 0701 - 12/19 0700 In: 1745.1 [I.V.:1745.1] Out: 2051 [Urine:2050; Stool:1] Intake/Output this shift: Total I/O In: -  Out: 350 [Urine:350]  General appearance: alert, cooperative and no distress GI: soft, non-tender; bowel sounds normal; no masses,  no organomegaly L great toe gout, is better.  Lab Results:   Basename 09/18/11 0335 09/17/11 1429  WBC 13.0* 14.2*  HGB 12.3* 12.3*  HCT 35.9* 35.2*  PLT 282 284    BMET  Basename 09/18/11 0335 09/17/11 1429 09/17/11 0720  NA 135 -- 136  K 3.5 -- 3.5  CL 101 -- 100  CO2 25 -- 26  GLUCOSE 117* -- 90  BUN 9 -- 11  CREATININE 0.77 0.74 --  CALCIUM 9.2 -- 9.7   PT/INR  Basename 09/17/11 1035  LABPROT 14.2  INR 1.08     Studies/Results: Ct Abdomen Pelvis W Contrast  09/17/2011  *RADIOLOGY REPORT*  Clinical Data: Left lower quadrant abdominal pain  CT ABDOMEN AND PELVIS WITH CONTRAST  Technique:  Multidetector CT imaging of the abdomen and pelvis was performed following the standard protocol during bolus administration of intravenous contrast.  Contrast: OMNIPAQUE IOHEXOL 300 MG/ML IV SOLN, 25mL OMNIPAQUE IOHEXOL 300 MG/ML IV SOLN  Comparison: None.  Findings:  Normal hepatic contour.  No discrete hepatic lesions.  Normal gallbladder.  No intra or extrahepatic biliary ductal dilatation. Incidental note is made of a small fluid collection inferior to the right lobe of the liver (coronal image 35, series 4 and axial image 35, series 2).  This fluid collection is without peripheral enhancement.  There is symmetric  enhancement and excretion of the bilateral kidneys.  No discrete renal lesion.  No urinary obstruction.  The bilateral adrenal glands, pancreas and spleen are normal.  There is extensive mesenteric stranding adjacent to the anterior aspect of the mid descending colon (image 59, series 2) which is associated with multiple foci of extraluminal air compatible with perforated diverticulitis.  There is minimal peripheral enhancement about the adjacent fluid collection without a well-defined abscess. The inflammation secondarily involves an adjacent loop of small bowel which is thick-walled.  Scattered diverticuli are seen within the cecum and ascending colon.  Normal appendix.  The proximal small bowel is mildly dilated but there is no evidence of obstruction.  No portal venous gas.  Normal caliber abdominal aorta.  The major branch vessels of the abdominal aorta, including the IMA are patent.  No retroperitoneal, shoddy mesenteric lymph nodes with a index noted measuring 9 mm in diameter (image 45) are favored to be reactive  in etiology.  No retroperitoneal, mesenteric, pelvic or inguinal lymphadenopathy.  Limited visualization of the lower thorax demonstrates bibasilar atelectasis.  No focal airspace opacities.  No pleural effusion. Normal heart size.  No pericardial effusion.  No acute or aggressive osseous abnormalities.  L5 - S1 degenerative change.  Note is made of small bilateral mesenteric fat containing inguinal hernias.  IMPRESSION: 1.  Findings compatible with a perforated diverticulitis involving the mid/distal aspect of the descending colon.  There is expected extensive adjacent mesenteric stranding and  fluid without a well- defined abscess.  2.  Inflammatory change secondarily involves an adjacent loop of small bowel, however there is no evidence of enteric obstruction.  Above findings discussed with Dr. Laveda Norman at [phone].  Original Report Authenticated By: Waynard Reeds, M.D.     Anti-infectives: Anti-infectives     Start     Dose/Rate Route Frequency Ordered Stop   09/17/11 1500   ertapenem (INVANZ) 1 g in sodium chloride 0.9 % 50 mL IVPB        1 g 100 mL/hr over 30 Minutes Intravenous Every 24 hours 09/17/11 1348     09/17/11 1045   ertapenem (INVANZ) 1 g in sodium chloride 0.9 % 50 mL IVPB  Status:  Discontinued        1 g 100 mL/hr over 30 Minutes Intravenous Every 24 hours 09/17/11 1035 09/17/11 1220         Current Facility-Administered Medications  Medication Dose Route Frequency Provider Last Rate Last Dose  . 0.9 %  sodium chloride infusion   Intravenous Continuous Fayrene Helper, PA 125 mL/hr at 09/18/11 1537    . acetaminophen (TYLENOL) tablet 650 mg  650 mg Oral Q6H PRN Sherrie George, PA   650 mg at 09/19/11 9604   Or  . acetaminophen (TYLENOL) suppository 650 mg  650 mg Rectal Q6H PRN Sherrie George, PA      . colchicine tablet 0.6 mg  0.6 mg Oral BID Sherrie George, PA   0.6 mg at 09/18/11 2134  . dextrose 5 % and 0.45 % NaCl with KCl 20 mEq/L infusion   Intravenous Continuous Sherrie George, PA 100 mL/hr at 09/19/11 0451    . diphenhydrAMINE (BENADRYL) capsule 25 mg  25 mg Oral Q6H PRN Sherrie George, PA      . diphenhydrAMINE (BENADRYL) injection 25 mg  25 mg Intravenous Q6H PRN Ardeth Sportsman, MD   25 mg at 09/18/11 1815  . diphenhydrAMINE-zinc acetate (BENADRYL) 2-0.1 % cream   Topical Continuous PRN Sherrie George, PA      . enoxaparin (LOVENOX) injection 40 mg  40 mg Subcutaneous Q24H Sherrie George, PA   40 mg at 09/18/11 1537  . ertapenem (INVANZ) 1 g in sodium chloride 0.9 % 50 mL IVPB  1 g Intravenous Q24H Sherrie George, PA   1 g at 09/18/11 1537  . HYDROmorphone (DILAUDID) injection 0.5-1 mg  0.5-1 mg Intravenous Q1H PRN Sherrie George, PA   1 mg at 09/19/11 0451  . indomethacin (INDOCIN) capsule 50 mg  50 mg Oral BID PRN Sherrie George, PA   50 mg at 09/19/11 0626  . morphine 4 MG/ML injection 4 mg  4 mg  Intravenous Once Fayrene Helper, PA      . ondansetron [facility]) injection 4 mg  4 mg Intravenous Q6H PRN Sherrie George, PA      . pantoprazole (PROTONIX) injection 40 mg  40 mg Intravenous QHS Sherrie George, [name]   40 mg at 09/18/11 2132    Assessment/Plan Diverticulitis with perforation Gout  GerD BMi 29 Plan: continue antibiotics, treat gout.  Start some clears. Check labs in a.m. Repeat CT on Friday.     LOS: 2 days    JENNINGS,WILLARD 09/19/2011  Attending: Pt continues to improve from his acute diverticulitis with micro-perforation.  On IV Invanz and taking clear liquid diet.  Will check CBC in AM and repeat CT Abd on Friday 12/21 if pt continues to improve clinically. Velora Heckler, MD, FACS General &  Endocrine Surgery [facility]

## 2011-09-20 LAB — CBC
HCT: 34.1 % — ABNORMAL LOW (ref 39.0–52.0)
Hemoglobin: 11.9 g/dL — ABNORMAL LOW (ref 13.0–17.0)
MCH: 32.2 pg (ref 26.0–34.0)
MCHC: 34.9 g/dL (ref 30.0–36.0)
MCV: 92.4 fL (ref 78.0–100.0)
Platelets: 345 10*3/uL (ref 150–400)
RBC: 3.69 MIL/uL — ABNORMAL LOW (ref 4.22–5.81)
RDW: 12.3 % (ref 11.5–15.5)
WBC: 4.4 10*3/uL (ref 4.0–10.5)

## 2011-09-20 LAB — MAGNESIUM: Magnesium: 2.3 mg/dL (ref 1.5–2.5)

## 2011-09-20 NOTE — Consult Note (Signed)
Nutrition Diet Education  - Reviewed sources of fiber in diet and recommended low fiber food choices. Discussed role between fiber and diverticulitis. Provided pt with handout of this information. Pt expressed understanding.  Nutrition dx:  Nutrition-related knowledge deficit r/t diet therapy AEB MD request  Intervention:  Brief education;  Provided.  Goals of nutrition therapy discussed.  Understanding confirmed.  RD contact information provided.  Monitoring:  Knowledge; for questions.  Please consult RD if new questions present.  Pager: [phone]

## 2011-09-20 NOTE — Progress Notes (Signed)
Surgery:  Patient improving daily.  Will advance to full liquid diet and request nutritionist to instruct patient in low residue diet.  CT Abd tomorrow.  If improving, will convert to po abx and consider discharge home.  Velora Heckler, MD, FACS General & Endocrine Surgery [facility]

## 2011-09-20 NOTE — Progress Notes (Addendum)
Subjective: He continues to improve clinically.  Gout still bothering him some.  Abd nontender.  Objective: Vital signs in last 24 hours: Temp:  [97.8 F (36.6 C)-98.3 F (36.8 C)] 98.1 F (36.7 C) (12/20 0550) Pulse Rate:  [75-92] 75  (12/20 0550) Resp:  [18-20] 20  (12/20 0550) BP: (134-138)/(81-86) 136/86 mmHg (12/20 0550) SpO2:  [96 %-99 %] 96 % (12/20 0550) Last BM Date: 09/19/11  Intake/Output from previous day: 12/19 0701 - 12/20 0700 In: 2110 [P.O.:260; I.V.:1700; IV Piggyback:150] Out: 2610 [Urine:2610] Intake/Output this shift:    General appearance: alert, cooperative and no distress GI: soft, non-tender; bowel sounds normal; no masses,  no organomegaly L great toe gout, is better.  Lab Results:   [facility] 09/20/11 0350 09/18/11 0335  WBC 4.4 13.0*  HGB 11.9* 12.3*  HCT 34.1* 35.9*  PLT 345 282    BMET  Basename 09/19/11 0916 09/18/11 0335  NA 135 135  K 3.6 3.5  CL 100 101  CO2 26 25  GLUCOSE 112* 117*  BUN 9 9  CREATININE 0.73 0.77  CALCIUM 9.0 9.2   PT/INR  Basename 09/17/11 1035  LABPROT 14.2  INR 1.08     Studies/Results: No results found.  Anti-infectives: Anti-infectives     Start     Dose/Rate Route Frequency Ordered Stop   09/17/11 1500   ertapenem (INVANZ) 1 g in sodium chloride 0.9 % 50 mL IVPB        1 g 100 mL/hr over 30 Minutes Intravenous Every 24 hours 09/17/11 1348     09/17/11 1045   ertapenem (INVANZ) 1 g in sodium chloride 0.9 % 50 mL IVPB  Status:  Discontinued        1 g 100 mL/hr over 30 Minutes Intravenous Every 24 hours 09/17/11 1035 09/17/11 1220         Current Facility-Administered Medications  Medication Dose Route Frequency Provider Last Rate Last Dose  . 0.9 %  sodium chloride infusion   Intravenous Continuous Fayrene Helper, PA 125 mL/hr at 09/18/11 1537    . acetaminophen (TYLENOL) tablet 650 mg  650 mg Oral Q6H PRN Sherrie George, PA   650 mg at 09/19/11 2012   Or  . acetaminophen (TYLENOL)  suppository 650 mg  650 mg Rectal Q6H PRN Sherrie George, PA      . colchicine tablet 0.6 mg  0.6 mg Oral BID Sherrie George, PA   0.6 mg at 09/19/11 2150  . dextrose 5 % and 0.45 % NaCl with KCl 20 mEq/L infusion   Intravenous Continuous Sherrie George, PA 100 mL/hr at 09/20/11 0529    . diphenhydrAMINE (BENADRYL) capsule 25 mg  25 mg Oral Q6H PRN Sherrie George, PA   25 mg at 09/19/11 2156  . diphenhydrAMINE (BENADRYL) injection 25 mg  25 mg Intravenous Q6H PRN Ardeth Sportsman, MD   25 mg at 09/18/11 1815  . diphenhydrAMINE-zinc acetate (BENADRYL) 2-0.1 % cream   Topical Continuous PRN Sherrie George, PA      . enoxaparin (LOVENOX) injection 40 mg  40 mg Subcutaneous Q24H Sherrie George, PA   40 mg at 09/19/11 1512  . ertapenem (INVANZ) 1 g in sodium chloride 0.9 % 50 mL IVPB  1 g Intravenous Q24H Sherrie George, PA   1 g at 09/19/11 1512  . HYDROmorphone (DILAUDID) injection 0.5-1 mg  0.5-1 mg Intravenous Q1H PRN Sherrie George, PA   1 mg at 09/19/11 2151  . indomethacin (INDOCIN) capsule 50 mg  50  mg Oral BID PRN Sherrie George, PA   50 mg at 09/19/11 2150  . morphine 4 MG/ML injection 4 mg  4 mg Intravenous Once Fayrene Helper, PA      . ondansetron [facility]) injection 4 mg  4 mg Intravenous Q6H PRN Sherrie George, PA      . pantoprazole (PROTONIX) injection 40 mg  40 mg Intravenous QHS Sherrie George, [name]   40 mg at 09/19/11 2155    Assessment/Plan Diverticulitis with perforation, WBC back to normal Gout  GerD BMi 29 Plan: continue antibiotics, treat gout.  On clears, CT tomorrow..     LOS: 3 days    Eduarda Scrivens 09/20/2011

## 2011-09-21 ENCOUNTER — Inpatient Hospital Stay (HOSPITAL_COMMUNITY): Payer: Self-pay

## 2011-09-21 DIAGNOSIS — K219 Gastro-esophageal reflux disease without esophagitis: Secondary | ICD-10-CM

## 2011-09-21 DIAGNOSIS — E790 Hyperuricemia without signs of inflammatory arthritis and tophaceous disease: Secondary | ICD-10-CM

## 2011-09-21 DIAGNOSIS — K5720 Diverticulitis of large intestine with perforation and abscess without bleeding: Secondary | ICD-10-CM

## 2011-09-21 DIAGNOSIS — IMO0002 Reserved for concepts with insufficient information to code with codable children: Secondary | ICD-10-CM

## 2011-09-21 HISTORY — DX: Hyperuricemia without signs of inflammatory arthritis and tophaceous disease: E79.0

## 2011-09-21 MED ORDER — IOHEXOL 300 MG/ML  SOLN
100.0000 mL | Freq: Once | INTRAMUSCULAR | Status: AC | PRN
  Administered 2011-09-21: 100 mL via INTRAVENOUS

## 2011-09-21 MED ORDER — AMOXICILLIN-POT CLAVULANATE 875-125 MG PO TABS: 1.0000 | ORAL_TABLET | Freq: Two times a day (BID) | ORAL | Status: AC

## 2011-09-21 MED ORDER — ACETAMINOPHEN 325 MG PO TABS: 650.0000 mg | ORAL_TABLET | Freq: Four times a day (QID) | ORAL | Status: AC | PRN

## 2011-09-21 MED ORDER — AMOXICILLIN-POT CLAVULANATE 875-125 MG PO TABS
1.0000 | ORAL_TABLET | Freq: Two times a day (BID) | ORAL | Status: DC
  Filled 2011-09-21: qty 1

## 2011-09-21 MED ORDER — COLCHICINE 0.6 MG PO TABS: 0.6000 mg | ORAL_TABLET | Freq: Every day | ORAL | Status: DC

## 2011-09-21 NOTE — Discharge Summary (Signed)
Physician Discharge Summary  Patient ID: Alexander [name] MRN: [id number] DOB/AGE: May 13, 1961 50 y.o.  Admit date: 09/17/2011 Discharge date: 09/21/2011  Admission Diagnoses: Diverticulitis with perforation  Discharge Diagnoses: Same Active Problems:  Diverticulitis of colon with perforation  Gout  GERD (gastroesophageal reflux disease)  BMI (body mass index) 20.0-29.9    Hospital Course: Patient is a 50 year old gentleman was in his normal state of health up to 2-3 days prior to admission. He developed abdominal pain and thought he had flulike symptoms. Became progressively worse ultimately presented to the emergency room at [facility] to get a low-grade fever of 100.1 a white count elevation 15,600 and CT scan findings compatible with diverticulitis in the mid distal aspect of the descending colon. There was extensive stranding and fluid without defined abscess. Air was suggestive of a perforation. He was admitted after evaluation by Dr. Darnell Level and placed on IV antibiotics. He's made a good recovery. Repeat CT scan today shows a definite perforation with a gas collection. But no fluid or abscess. Patient is anxious for discharge. After long discussion it was opted to discharge him home on 10 days of antibiotics. He understands that there is still a possibility of an abscess formation even on antibiotics. He knows to call has any abdominal pain fever trouble voiding or any other abnormal discomfort. Her groin ascending home on Augmentin 875/125 twice a day for 10 days. He set up to have a repeat CT scan as an outpatient on 10/01/2011. And followup with Dr. Gerrit Friends the following week. He also had an episode of gout which we treated with Indocin and colchicine. I could discontinue the Indocin ongoing colchicine 0.61 daily for a month. He does not have a primary care and advised him to look for one to followup on his gout and other medical issues. Condition on discharge: Improving. Will  [facility] physician assistant for Dr. Darnell Level.  Disposition: Home or Self Care   Current Discharge Medication List    START taking these medications   Details  acetaminophen (TYLENOL) 325 MG tablet Take 2 tablets (650 mg total) by mouth every 6 (six) hours as needed (or Temp > 100). Qty: 30 tablet    amoxicillin-clavulanate (AUGMENTIN) 875-125 MG per tablet Take 1 tablet by mouth every 12 (twelve) hours. Qty: 20 tablet, Refills: 0    colchicine 0.6 MG tablet Take 1 tablet (0.6 mg total) by mouth daily. Qty: 30 tablet, Refills: 0       Follow-up Information    Follow up with Velora Heckler, MD. Make an appointment in 2 weeks. (Call if you have a problem with fever, increased pain, trouble voiding.)       Follow up with Radiology at [facility]. (10/01/11 At 8:00 AM.  Follow instructions from Radiology on CT)          Signed: [name] 09/21/2011, 5:26 PM

## 2011-09-21 NOTE — Progress Notes (Signed)
09/21/11 1807 Patient very anxious to be discharged. PIV removed. Discharge education and follow-up appointments for the patient. Patient traveled by car to his house. Karie Schwalbe RN

## 2011-09-21 NOTE — Progress Notes (Signed)
CCS Attending:  Patient is clinically well with no pain, no tenderness, ambulating comfortably, and tolerating a diet with bowel movements.  CT scan shows improved inflammation, but persistent extraluminal air.  Discussed with patient.  He will go home on oral abx for 10 more days, follow up CT scan in 14 days, and then return to my office for follow up.  If he develops any abdominal pain, fever, nausea, or other symptoms, he is to return immediately for evaluation.  Possibility of emergent surgery with colostomy is reviewed again with patient.  He understands.  Plan discharge home today.  Velora Heckler, MD, FACS General & Endocrine Surgery [facility]

## 2011-09-21 NOTE — Progress Notes (Signed)
Patient ID: Alexander [name], male   DOB: Feb 08, 1961, 50 y.o.   MRN: [id number]    Subjective: He feels good want study completed and to go homel    Objective: Vital signs in last 24 hours: Temp:  [97.3 F (36.3 C)-98.5 F (36.9 C)] 97.8 F (36.6 C) (12/21 0600) Pulse Rate:  [75-95] 75  (12/21 0600) Resp:  [18-20] 19  (12/21 0600) BP: (135-138)/(85-88) 137/88 mmHg (12/21 0600) SpO2:  [96 %-98 %] 96 % (12/21 0600) Last BM Date: 09/20/11  Intake/Output from previous day: 12/20 0701 - 12/21 0700 In: 1310 [P.O.:700; I.V.:600; IV Piggyback:10] Out: 1680 [Urine:1680] Intake/Output this shift: Total I/O In: -  Out: 700 [Urine:700]  General appearance: alert, cooperative and no distress GI: soft, non-tender; bowel sounds normal; no masses,  no organomegaly L great toe gout, is better.  Lab Results:   [facility] 09/20/11 0350  WBC 4.4  HGB 11.9*  HCT 34.1*  PLT 345    BMET  Basename 09/19/11 0916  NA 135  K 3.6  CL 100  CO2 26  GLUCOSE 112*  BUN 9  CREATININE 0.73  CALCIUM 9.0   PT/INR No results found for this basename: LABPROT:2,INR:2 in the last 72 hours   Studies/Results: No results found.  Anti-infectives: Anti-infectives     Start     Dose/Rate Route Frequency Ordered Stop   09/17/11 1500   ertapenem (INVANZ) 1 g in sodium chloride 0.9 % 50 mL IVPB        1 g 100 mL/hr over 30 Minutes Intravenous Every 24 hours 09/17/11 1348     09/17/11 1045   ertapenem (INVANZ) 1 g in sodium chloride 0.9 % 50 mL IVPB  Status:  Discontinued        1 g 100 mL/hr over 30 Minutes Intravenous Every 24 hours 09/17/11 1035 09/17/11 1220         Current Facility-Administered Medications  Medication Dose Route Frequency Provider Last Rate Last Dose  . acetaminophen (TYLENOL) tablet 650 mg  650 mg Oral Q6H PRN Sherrie George, PA   650 mg at 09/20/11 2149   Or  . acetaminophen (TYLENOL) suppository 650 mg  650 mg Rectal Q6H PRN Sherrie George, PA      . colchicine  tablet 0.6 mg  0.6 mg Oral BID Sherrie George, PA   0.6 mg at 09/21/11 1025  . dextrose 5 % and 0.45 % NaCl with KCl 20 mEq/L infusion   Intravenous Continuous Velora Heckler, MD 50 mL/hr at 09/20/11 1532    . diphenhydrAMINE (BENADRYL) capsule 25 mg  25 mg Oral Q6H PRN Sherrie George, PA   25 mg at 09/20/11 2130  . diphenhydrAMINE (BENADRYL) injection 25 mg  25 mg Intravenous Q6H PRN Ardeth Sportsman, MD   25 mg at 09/18/11 1815  . diphenhydrAMINE-zinc acetate (BENADRYL) 2-0.1 % cream   Topical Continuous PRN Sherrie George, PA      . enoxaparin (LOVENOX) injection 40 mg  40 mg Subcutaneous Q24H Sherrie George, PA   40 mg at 09/20/11 1535  . ertapenem (INVANZ) 1 g in sodium chloride 0.9 % 50 mL IVPB  1 g Intravenous Q24H Sherrie George, PA   1 g at 09/20/11 1533  . HYDROmorphone (DILAUDID) injection 0.5-1 mg  0.5-1 mg Intravenous Q1H PRN Sherrie George, PA   1 mg at 09/20/11 2132  . indomethacin (INDOCIN) capsule 50 mg  50 mg Oral BID PRN Sherrie George, PA   50 mg at 09/20/11 1744  .  morphine 4 MG/ML injection 4 mg  4 mg Intravenous Once Fayrene Helper, PA      . ondansetron [facility]) injection 4 mg  4 mg Intravenous Q6H PRN Sherrie George, PA      . pantoprazole (PROTONIX) injection 40 mg  40 mg Intravenous QHS Sherrie George, [name]   40 mg at 09/20/11 2133  . DISCONTD: 0.9 %  sodium chloride infusion   Intravenous Continuous Fayrene Helper, PA 125 mL/hr at 09/18/11 1537      Assessment/Plan Diverticulitis with perforation, WBC back to normal Gout  GerD BMi 29 Plan: CT pending.                                                                                                                                 LOS: 4 days    Alexander [name] 09/21/2011

## 2011-09-21 NOTE — Discharge Summary (Signed)
CCS Attending:  See progress note from today.  Velora Heckler, MD, FACS General & Endocrine Surgery [facility]

## 2011-09-24 ENCOUNTER — Other Ambulatory Visit (INDEPENDENT_AMBULATORY_CARE_PROVIDER_SITE_OTHER): Payer: Self-pay | Admitting: Surgery

## 2011-09-27 ENCOUNTER — Telehealth (INDEPENDENT_AMBULATORY_CARE_PROVIDER_SITE_OTHER): Payer: Self-pay

## 2011-09-27 NOTE — Telephone Encounter (Signed)
Faxed received here in the office from [facility] Radiology [facility]) for incomplete  CT order. CT was for an inpatient.  Entered by Kieth Brightly.  Per Dr. Gerrit Friends only oral contrast, reason of scan is diverticulitis with perforation.

## 2011-09-28 ENCOUNTER — Other Ambulatory Visit (INDEPENDENT_AMBULATORY_CARE_PROVIDER_SITE_OTHER): Payer: Self-pay | Admitting: Surgery

## 2011-09-28 DIAGNOSIS — K5792 Diverticulitis of intestine, part unspecified, without perforation or abscess without bleeding: Secondary | ICD-10-CM

## 2011-10-04 ENCOUNTER — Inpatient Hospital Stay (HOSPITAL_COMMUNITY): Admission: RE | Admit: 2011-10-04 | Payer: Self-pay | Source: Ambulatory Visit

## 2011-10-04 ENCOUNTER — Other Ambulatory Visit (INDEPENDENT_AMBULATORY_CARE_PROVIDER_SITE_OTHER): Payer: Self-pay | Admitting: Surgery

## 2011-10-04 ENCOUNTER — Ambulatory Visit (HOSPITAL_COMMUNITY)
Admission: RE | Admit: 2011-10-04 | Discharge: 2011-10-04 | Disposition: A | Discharge status: Home / Self Care | Payer: BC Managed Care – PPO | Source: Ambulatory Visit | Attending: Surgery | Admitting: Surgery

## 2011-10-04 ENCOUNTER — Telehealth (INDEPENDENT_AMBULATORY_CARE_PROVIDER_SITE_OTHER): Payer: Self-pay | Admitting: Surgery

## 2011-10-04 DIAGNOSIS — K5792 Diverticulitis of intestine, part unspecified, without perforation or abscess without bleeding: Secondary | ICD-10-CM

## 2011-10-04 DIAGNOSIS — K5732 Diverticulitis of large intestine without perforation or abscess without bleeding: Secondary | ICD-10-CM | POA: Insufficient documentation

## 2011-10-04 MED ORDER — IOHEXOL 300 MG/ML  SOLN
100.0000 mL | Freq: Once | INTRAMUSCULAR | Status: AC | PRN
  Administered 2011-10-04: 100 mL via INTRAVENOUS

## 2011-10-04 NOTE — Telephone Encounter (Signed)
Please review ct report and advised. Patient has not been seen here in the office. Thank you.

## 2011-10-05 NOTE — Progress Notes (Signed)
Left message for patient to call back for appointment preference.

## 2011-10-25 ENCOUNTER — Encounter (INDEPENDENT_AMBULATORY_CARE_PROVIDER_SITE_OTHER): Payer: Self-pay | Admitting: Surgery

## 2011-10-31 ENCOUNTER — Ambulatory Visit (INDEPENDENT_AMBULATORY_CARE_PROVIDER_SITE_OTHER): Payer: Self-pay | Admitting: Surgery

## 2011-11-14 ENCOUNTER — Encounter (INDEPENDENT_AMBULATORY_CARE_PROVIDER_SITE_OTHER): Payer: Self-pay | Admitting: Surgery

## 2011-11-28 ENCOUNTER — Encounter (INDEPENDENT_AMBULATORY_CARE_PROVIDER_SITE_OTHER): Payer: Self-pay | Admitting: Surgery

## 2012-03-17 ENCOUNTER — Ambulatory Visit: Payer: Self-pay

## 2012-08-29 ENCOUNTER — Ambulatory Visit: Payer: BC Managed Care – PPO

## 2012-08-29 ENCOUNTER — Ambulatory Visit (INDEPENDENT_AMBULATORY_CARE_PROVIDER_SITE_OTHER): Payer: BC Managed Care – PPO | Admitting: Emergency Medicine

## 2012-08-29 VITALS — BP 151/79 | HR 103 | Temp 98.1°F | Resp 18 | Ht 68.5 in | Wt 185.4 lb

## 2012-08-29 DIAGNOSIS — K5732 Diverticulitis of large intestine without perforation or abscess without bleeding: Secondary | ICD-10-CM

## 2012-08-29 DIAGNOSIS — T148XXA Other injury of unspecified body region, initial encounter: Secondary | ICD-10-CM

## 2012-08-29 DIAGNOSIS — K5792 Diverticulitis of intestine, part unspecified, without perforation or abscess without bleeding: Secondary | ICD-10-CM

## 2012-08-29 DIAGNOSIS — S61219A Laceration without foreign body of unspecified finger without damage to nail, initial encounter: Secondary | ICD-10-CM

## 2012-08-29 DIAGNOSIS — S61209A Unspecified open wound of unspecified finger without damage to nail, initial encounter: Secondary | ICD-10-CM

## 2012-08-29 DIAGNOSIS — I872 Venous insufficiency (chronic) (peripheral): Secondary | ICD-10-CM

## 2012-08-29 DIAGNOSIS — I8310 Varicose veins of unspecified lower extremity with inflammation: Secondary | ICD-10-CM

## 2012-08-29 MED ORDER — CELECOXIB 200 MG PO CAPS: 200.0000 mg | ORAL_CAPSULE | Freq: Every day | ORAL | Status: DC

## 2012-08-29 MED ORDER — CELECOXIB 200 MG PO CAPS
200.0000 mg | ORAL_CAPSULE | Freq: Two times a day (BID) | ORAL | Status: DC
Start: 2012-08-29 — End: 2012-08-29

## 2012-08-29 NOTE — Progress Notes (Signed)
[facility] [address] [place] [address] [phone]  Date:  08/29/2012   Name:  Alexander [name]   DOB:  1960-12-16   MRN:  [id number]  PCP:  No primary provider on file.    Chief Complaint: Hand Pain and Joint Swelling   History of Present Illness:  Alexander [name] is a 51 y.o. very pleasant male patient who presents with the following:  Multiple complaints.  Was injured at work when a press pinched the end of his left index finger.  Seen by the company doctor and he wants FMLA paperwork filed to move him to a different job at work.  He is complaining that his finger was never xrayed following the injury.  Says that the nerves are traumatized in his finger by the trapped blood in his finger.  Has chronic rash on medial ankles bilaterally.  Says that his ankles swell as well as his lower legs as a result of "gout" and standing in one place at his job.    Has a history of diverticulitis a year ago and wants FMLA paperwork signed putting him off work at the time of his hospitalization.  Also requests referral to a GI for colonoscopy.  Has chronic ankle pain and pain in knees with no history of injury or overuse.  Says the pain causes his legs to "give out" in the morning when he gets up to walk.  No history of cellulitis or effusion.    Patient Active Problem List  Diagnosis  . Diverticulitis of colon with perforation  . Gout  . GERD (gastroesophageal reflux disease)  . BMI (body mass index) 20.0-29.9    Past Medical History  Diagnosis Date  . Gout   . History of gastroesophageal reflux (GERD)   . Overweight (BMI 25.0-29.9)   . Gout 2007  . Diverticulitis 2012    Past Surgical History  Procedure Date  . None     History  Substance Use Topics  . Smoking status: Former Smoker -- 1.0 packs/day    Types: Cigarettes  . Smokeless tobacco: Never Used     Comment: quit 1990's  . Alcohol Use: Yes     Comment: occassional    No family history on  file.  No Known Allergies  Medication list has been reviewed and updated.  Current Outpatient Prescriptions on File Prior to Visit  Medication Sig Dispense Refill  . colchicine 0.6 MG tablet Take 1 tablet (0.6 mg total) by mouth daily.  30 tablet  0    Review of Systems:  As per HPI, otherwise negative.    Physical Examination: Filed Vitals:   08/29/12 1359  BP: 151/79  Pulse: 103  Temp: 98.1 F (36.7 C)  Resp: 18   Filed Vitals:   08/29/12 1359  Height: 5' 8.5" (1.74 m)  Weight: 185 lb 6.4 oz (84.097 kg)   Body mass index is 27.78 kg/(m^2). Ideal Body Weight: Weight in (lb) to have BMI = 25: 166.5    GEN: WDWN, NAD, Non-toxic, Alert & Oriented x 3 HEENT: Atraumatic, Normocephalic.  Ears and Nose: No external deformity. EXTR: No clubbing/cyanosis/edema NEURO: Normal gait.  PSYCH: Normally interactive. Conversant. Not depressed or anxious appearing.  Calm demeanor.  Left Hand:  u shaped flap laceration tip of finger with several small contusions surrounding the laceration on the tip of the finger.  Flap was not sutured and is healing by 2nd intention.  No cellulitis or erythema.  No swelling or  limitation of movement. Right Ankle:  Venous stasis dermatitis ankle  Assessment and Plan: Venous stasis dermatitis     Compression hose Arthralgias     Celebre[employer] Laceration finger     Xray finger Diverticulitis by history     GI referral Referral to [address]  Carmelina Dane, MD  [facility] reading (PRIMARY) by  Dr. Dareen Piano.  No osseous injury.

## 2012-09-02 ENCOUNTER — Telehealth: Payer: Self-pay

## 2012-09-02 NOTE — Telephone Encounter (Signed)
Patient wants ref for colonoscopy scheduled for Jan 2014 or later.

## 2012-09-02 NOTE — Telephone Encounter (Signed)
Patient also request Friday appointment.

## 2012-09-08 NOTE — Progress Notes (Signed)
Reviewed and agree.

## 2012-10-24 ENCOUNTER — Encounter: Payer: Self-pay | Admitting: Family Medicine

## 2012-10-24 ENCOUNTER — Ambulatory Visit: Payer: BC Managed Care – PPO

## 2012-10-24 ENCOUNTER — Telehealth: Payer: Self-pay | Admitting: Radiology

## 2012-10-24 ENCOUNTER — Ambulatory Visit (INDEPENDENT_AMBULATORY_CARE_PROVIDER_SITE_OTHER): Payer: BC Managed Care – PPO | Admitting: Family Medicine

## 2012-10-24 VITALS — BP 148/92 | HR 88 | Temp 97.9°F | Resp 16 | Ht 69.0 in | Wt 193.0 lb

## 2012-10-24 DIAGNOSIS — M25569 Pain in unspecified knee: Secondary | ICD-10-CM

## 2012-10-24 DIAGNOSIS — L309 Dermatitis, unspecified: Secondary | ICD-10-CM

## 2012-10-24 DIAGNOSIS — K5792 Diverticulitis of intestine, part unspecified, without perforation or abscess without bleeding: Secondary | ICD-10-CM

## 2012-10-24 DIAGNOSIS — M25579 Pain in unspecified ankle and joints of unspecified foot: Secondary | ICD-10-CM

## 2012-10-24 DIAGNOSIS — IMO0001 Reserved for inherently not codable concepts without codable children: Secondary | ICD-10-CM

## 2012-10-24 DIAGNOSIS — Z0000 Encounter for general adult medical examination without abnormal findings: Secondary | ICD-10-CM

## 2012-10-24 DIAGNOSIS — L259 Unspecified contact dermatitis, unspecified cause: Secondary | ICD-10-CM

## 2012-10-24 DIAGNOSIS — R609 Edema, unspecified: Secondary | ICD-10-CM

## 2012-10-24 LAB — CBC WITH DIFFERENTIAL/PLATELET
Basophils Absolute: 0 10*3/uL (ref 0.0–0.1)
Basophils Relative: 1 % (ref 0–1)
Eosinophils Absolute: 0.2 10*3/uL (ref 0.0–0.7)
Eosinophils Relative: 4 % (ref 0–5)
HCT: 38.7 % — ABNORMAL LOW (ref 39.0–52.0)
Hemoglobin: 13.8 g/dL (ref 13.0–17.0)
Lymphocytes Relative: 36 % (ref 12–46)
Lymphs Abs: 1.3 10*3/uL (ref 0.7–4.0)
MCH: 33.1 pg (ref 26.0–34.0)
MCHC: 35.7 g/dL (ref 30.0–36.0)
MCV: 92.8 fL (ref 78.0–100.0)
Monocytes Absolute: 0.4 10*3/uL (ref 0.1–1.0)
Monocytes Relative: 12 % (ref 3–12)
Neutro Abs: 1.8 10*3/uL (ref 1.7–7.7)
Neutrophils Relative %: 47 % (ref 43–77)
Platelets: 250 10*3/uL (ref 150–400)
RBC: 4.17 MIL/uL — ABNORMAL LOW (ref 4.22–5.81)
RDW: 13.9 % (ref 11.5–15.5)
WBC: 3.7 10*3/uL — ABNORMAL LOW (ref 4.0–10.5)

## 2012-10-24 LAB — COMPREHENSIVE METABOLIC PANEL
ALT: 34 U/L (ref 0–53)
AST: 26 U/L (ref 0–37)
Albumin: 4.2 g/dL (ref 3.5–5.2)
Alkaline Phosphatase: 54 U/L (ref 39–117)
BUN: 11 mg/dL (ref 6–23)
CO2: 27 mEq/L (ref 19–32)
Calcium: 9.3 mg/dL (ref 8.4–10.5)
Chloride: 106 mEq/L (ref 96–112)
Creat: 0.75 mg/dL (ref 0.50–1.35)
Glucose, Bld: 106 mg/dL — ABNORMAL HIGH (ref 70–99)
Potassium: 3.8 mEq/L (ref 3.5–5.3)
Sodium: 140 mEq/L (ref 135–145)
Total Bilirubin: 0.4 mg/dL (ref 0.3–1.2)
Total Protein: 7.2 g/dL (ref 6.0–8.3)

## 2012-10-24 LAB — TSH: TSH: 1.561 u[IU]/mL (ref 0.350–4.500)

## 2012-10-24 LAB — LIPID PANEL
Cholesterol: 150 mg/dL (ref 0–200)
HDL: 56 mg/dL (ref >39)
LDL Cholesterol: 77 mg/dL (ref 0–99)
Total CHOL/HDL Ratio: 2.7 Ratio
Triglycerides: 84 mg/dL (ref <150)
VLDL: 17 mg/dL (ref 0–40)

## 2012-10-24 LAB — IFOBT (OCCULT BLOOD): IFOBT: NEGATIVE

## 2012-10-24 LAB — URIC ACID: Uric Acid, Serum: 7.2 mg/dL (ref 4.0–7.8)

## 2012-10-24 MED ORDER — HYDROXYZINE HCL 25 MG PO TABS: 12.5000 mg | ORAL_TABLET | Freq: Three times a day (TID) | ORAL | Status: DC | PRN

## 2012-10-24 MED ORDER — INDOMETHACIN 50 MG PO CAPS: 50.0000 mg | ORAL_CAPSULE | Freq: Three times a day (TID) | ORAL | Status: DC

## 2012-10-24 MED ORDER — TRIAMCINOLONE ACETONIDE 0.1 % EX CREA: TOPICAL_CREAM | Freq: Two times a day (BID) | CUTANEOUS | Status: DC

## 2012-10-24 MED ORDER — CETIRIZINE HCL 10 MG PO TABS: 10.0000 mg | ORAL_TABLET | Freq: Every day | ORAL | Status: DC

## 2012-10-24 MED ORDER — LEVOCETIRIZINE DIHYDROCHLORIDE 5 MG PO TABS: 5.0000 mg | ORAL_TABLET | Freq: Every evening | ORAL | Status: DC

## 2012-10-24 NOTE — Progress Notes (Deleted)
  Subjective:    Patient ID: Alexander [name], male    DOB: [birth year], 52 y.o.   MRN: [id number]  HPI    Review of Systems  Constitutional: Negative.   HENT: Positive for nosebleeds.   Eyes: Negative.   Respiratory: Negative.   Cardiovascular: Positive for leg swelling.  Genitourinary: Negative.   Musculoskeletal: Positive for back pain, joint swelling and arthralgias.  Neurological: Negative.   Hematological: Bruises/bleeds easily.  Psychiatric/Behavioral: Negative.        Objective:   Physical Exam        Assessment & Plan:

## 2012-10-24 NOTE — Telephone Encounter (Signed)
Thanks, I called patient to advise.  

## 2012-10-24 NOTE — Telephone Encounter (Signed)
Patients meds were very expensive at the pharmacy, I advised him they will be cheaper at [facility], I sent them in except for the Xyzal, can you advise if you can give cheaper alternative? Amy

## 2012-10-24 NOTE — Telephone Encounter (Signed)
Yes, I have switched the xyzal to cetirizine and e-rx'ed it

## 2012-10-24 NOTE — Progress Notes (Signed)
Subjective:    Patient ID: Alexander [name], male    DOB: 05-Mar-1961, 52 y.o.   MRN: [id number]  HPI  Itchy rash for 2-3 mos on legs and now developing on arms, hydrating constantly with lotion. Deals w/ a lot of metal at work but no new products - no change in detergents, soaps, clothes, etc Saw dermatologist years ago - told not to bath in hot water.  Does seem to get better when he is not sweating. He showers every day and keeps it clean so excoriated areas don't get infected.  Having horrible pain in his Rt ankle - very swollen all the time, difficult to walk on it at all, no known injury. Wonders if he should start applying for diability. Rt knee also painful.  Past Medical History  Diagnosis Date  . Gout   . History of gastroesophageal reflux (GERD)   . Overweight (BMI 25.0-29.9)   . Gout 2007  . Diverticulitis 2012  . Allergy   . Arthritis    Past Surgical History  Procedure Date  . None   . Hemorrhoid    Current Outpatient Prescriptions on File Prior to Visit  Medication Sig Dispense Refill  . cetirizine (ZYRTEC) 10 MG tablet Take 1 tablet (10 mg total) by mouth daily.  30 tablet  11  . colchicine 0.6 MG tablet Take 1 tablet (0.6 mg total) by mouth daily.  30 tablet  0   No Known Allergies Family History  Problem Relation Age of Onset  . Hypertension Mother   . Gout Mother   . Hypertension Father   . Diabetes Maternal Aunt   . Diabetes Paternal Aunt   . Hypertension Sister   . Hypertension Brother    History   Social History  . Marital Status: Single    Spouse Name: N/A    Number of Children: N/A  . Years of Education: N/A   Occupational History  . assembly worker      Engineer, structural   Social History Main Topics  . Smoking status: Former Smoker -- 1.0 packs/day    Types: Cigarettes  . Smokeless tobacco: Never Used     Comment: quit 1990's  . Alcohol Use: 1.8 oz/week    3 Cans of beer per week     Comment: occassional  . Drug Use: No  . Sexually Active:  Yes   Other Topics Concern  . None   Social History Narrative  . None     Review of Systems  Constitutional: Negative.   HENT: Positive for nosebleeds.   Eyes: Negative.   Respiratory: Negative.   Cardiovascular: Positive for leg swelling. Negative for chest pain and palpitations.  Gastrointestinal: Negative.   Genitourinary: Negative.   Musculoskeletal: Positive for back pain, joint swelling and arthralgias. Negative for myalgias and gait problem.  Skin: Positive for rash.  Neurological: Negative.   Hematological: Negative for adenopathy. Bruises/bleeds easily.  Psychiatric/Behavioral: Negative.   All other systems reviewed and are negative.      BP 148/92  Pulse 88  Temp 97.9 F (36.6 C)  Resp 16  Ht 5\' 9"  (1.753 m)  Wt 193 lb (87.544 kg)  BMI 28.50 kg/m2 Objective:   Physical Exam  Constitutional: He is oriented to person, place, and time. He appears well-developed and well-nourished. No distress.  HENT:  Head: Normocephalic and atraumatic.  Right Ear: Tympanic membrane, external ear and ear canal normal.  Left Ear: Tympanic membrane, external ear and ear canal normal.  Nose: Nose normal.  Mouth/Throat: Uvula is midline, oropharynx is clear and moist and mucous membranes are normal. No oropharyngeal exudate.  Eyes: Conjunctivae normal are normal. Right eye exhibits no discharge. Left eye exhibits no discharge. No scleral icterus.  Neck: Normal range of motion. Neck supple. No thyromegaly present.  Cardiovascular: Normal rate, regular rhythm, normal heart sounds and intact distal pulses.   Pulmonary/Chest: Effort normal and breath sounds normal. No respiratory distress.  Abdominal: Soft. Bowel sounds are normal. He exhibits no distension and no mass. There is no tenderness. There is no rebound and no guarding.  Genitourinary: Prostate normal. Rectal exam shows external hemorrhoid. Rectal exam shows no internal hemorrhoid, no fissure, no mass, no tenderness and anal  tone normal. Guaiac negative stool. Prostate is not enlarged and not tender.  Musculoskeletal: He exhibits no edema.  Lymphadenopathy:    He has no cervical adenopathy.  Neurological: He is alert and oriented to person, place, and time. He has normal reflexes. No cranial nerve deficit. He exhibits normal muscle tone.  Skin: Skin is warm and dry. No rash noted. He is not diaphoretic. No erythema.  Psychiatric: He has a normal mood and affect. His behavior is normal.    [facility] reading (PRIMARY) by  Dr. Clelia Croft. Rt knee xray: mild joint space narrowing on medial aspect Rt ankle xray: mild joint space narrowing and marked soft tissue swelling.  Results for orders placed in visit on 10/24/12  IFOBT (OCCULT BLOOD)      Component Value Range   IFOBT Negative        Assessment & Plan:   1. Routine general medical examination at a health care facility  Ambulatory referral to Gastroenterology, Lipid panel, CBC with Differential, Comprehensive metabolic panel, PSA, IFOBT POC (occult bld, rslt in office)  2. Diverticulitis  Ambulatory referral to Gastroenterology  3. Edema  TSH  4. Joint pain, knee  TSH, Uric Acid, DG Knee Complete 4 Views Right  5. Joint pain, foot  Uric Acid, Sedimentation Rate, C-reactive protein, DG Ankle Complete Right, indomethacin (INDOCIN) 50 MG capsule  6. Dermatitis - strongly suspect this is some type of contact dermatitis - likely from metal or other exposure at work since it is mainly limited to extremities. Refer for allergy testing and treat allergies w/ oral antihistamines and topical steroids in the meantime. Ambulatory referral to Allergy,  triamcinolone cream (KENALOG) 0.1 %, hydrOXYzine (ATARAX/VISTARIL) 25 MG tablet, zyrtec 10mg  po qd  7. Elevated BP - last 2 BP elevated so technically does have new onset hypertension. Pt will work on [employer] and recheck at f/u. If still elev at next OV, rec starting BP med. Meds ordered this encounter  Medications                  .  triamcinolone cream (KENALOG) 0.1 %    Sig: Apply topically 2 (two) times daily.    Dispense:  454 g    Refill:  1  .  hydrOXYzine (ATARAX/VISTARIL) 25 MG tablet    Sig: Take 0.5-1 tablets (12.5-25 mg total) by mouth every 8 (eight) hours as needed for itching.    Dispense:  60 tablet    Refill:  0  . indomethacin (INDOCIN) 50 MG capsule    Sig: Take 1 capsule (50 mg total) by mouth 3 (three) times daily with meals.    Dispense:  90 capsule    Refill:  1  . cetirizine (ZYRTEC) 10 MG tablet    Sig: Take 1 tablet (10 mg  total) by mouth daily.    Dispense:  30 tablet    Refill:  11

## 2012-10-25 LAB — PSA: PSA: 0.58 ng/mL (ref <=4.00)

## 2012-10-25 LAB — SEDIMENTATION RATE: Sed Rate: 27 mm/hr — ABNORMAL HIGH (ref 0–16)

## 2012-10-25 LAB — C-REACTIVE PROTEIN: CRP: <0.5 mg/dL (ref <0.60)

## 2012-11-02 DIAGNOSIS — L309 Dermatitis, unspecified: Secondary | ICD-10-CM | POA: Insufficient documentation

## 2012-11-02 DIAGNOSIS — IMO0001 Reserved for inherently not codable concepts without codable children: Secondary | ICD-10-CM | POA: Insufficient documentation

## 2012-11-02 DIAGNOSIS — G8929 Other chronic pain: Secondary | ICD-10-CM | POA: Insufficient documentation

## 2012-11-20 ENCOUNTER — Telehealth: Payer: Self-pay

## 2012-11-20 NOTE — Telephone Encounter (Signed)
Error

## 2012-11-21 ENCOUNTER — Ambulatory Visit: Payer: BC Managed Care – PPO | Admitting: Family Medicine

## 2012-12-16 ENCOUNTER — Other Ambulatory Visit: Payer: Self-pay | Admitting: Family Medicine

## 2012-12-16 NOTE — Telephone Encounter (Signed)
Please contact this patient.  When this medication was prescribed in January, he was also referred to an allergy specialist.  Did he go?  If so, this medication should now be prescribed by that specialist.  Please get details.

## 2012-12-17 NOTE — Telephone Encounter (Signed)
LMOM to CB. 

## 2012-12-18 NOTE — Telephone Encounter (Signed)
LMOM to CB. 

## 2012-12-18 NOTE — Telephone Encounter (Signed)
Pt reported that he never got a call about allergist appt. He thought Dr Clelia Croft was going to wait to refer until she saw how the medication worked for him. He reported that the medication is helping him a great deal.  I advised him that referral was started and I will have Referrals check on appt, and RF the Rx while waiting. LM on Referrals VM asking them to check on this referral.

## 2012-12-19 ENCOUNTER — Telehealth: Payer: Self-pay

## 2012-12-19 ENCOUNTER — Ambulatory Visit: Payer: BC Managed Care – PPO | Admitting: Family Medicine

## 2012-12-19 NOTE — Telephone Encounter (Signed)
Can you reschedule the allergist appt from Feb.?

## 2012-12-19 NOTE — Telephone Encounter (Signed)
Patient would like a referral to an allergist it looks like we made him one in February but he never knew about it?

## 2013-01-16 ENCOUNTER — Ambulatory Visit: Payer: BC Managed Care – PPO | Admitting: Family Medicine

## 2013-03-05 ENCOUNTER — Other Ambulatory Visit: Payer: Self-pay | Admitting: Family Medicine

## 2013-03-06 ENCOUNTER — Telehealth: Payer: Self-pay

## 2013-03-06 NOTE — Telephone Encounter (Signed)
Pt states that his pharmacy sent over a request for hos gout and itching pt is unsure of the names of these medications at this time. Best# [phone]

## 2013-03-09 NOTE — Telephone Encounter (Signed)
What medications does he need? Indocin and hydroxyzine were sent in on 03/05/13

## 2013-03-20 ENCOUNTER — Ambulatory Visit (INDEPENDENT_AMBULATORY_CARE_PROVIDER_SITE_OTHER): Payer: BC Managed Care – PPO | Admitting: Family Medicine

## 2013-03-20 VITALS — BP 152/90 | HR 92 | Temp 97.8°F | Resp 18 | Ht 69.0 in | Wt 192.0 lb

## 2013-03-20 DIAGNOSIS — M1710 Unilateral primary osteoarthritis, unspecified knee: Secondary | ICD-10-CM

## 2013-03-20 DIAGNOSIS — L309 Dermatitis, unspecified: Secondary | ICD-10-CM

## 2013-03-20 DIAGNOSIS — L259 Unspecified contact dermatitis, unspecified cause: Secondary | ICD-10-CM

## 2013-03-20 DIAGNOSIS — M170 Bilateral primary osteoarthritis of knee: Secondary | ICD-10-CM

## 2013-03-20 DIAGNOSIS — IMO0002 Reserved for concepts with insufficient information to code with codable children: Secondary | ICD-10-CM

## 2013-03-20 DIAGNOSIS — M109 Gout, unspecified: Secondary | ICD-10-CM

## 2013-03-20 LAB — COMPREHENSIVE METABOLIC PANEL
ALT: 41 U/L (ref 0–53)
AST: 37 U/L (ref 0–37)
Albumin: 4.4 g/dL (ref 3.5–5.2)
Alkaline Phosphatase: 48 U/L (ref 39–117)
BUN: 16 mg/dL (ref 6–23)
CO2: 25 mEq/L (ref 19–32)
Calcium: 9.8 mg/dL (ref 8.4–10.5)
Chloride: 106 mEq/L (ref 96–112)
Creat: 1.02 mg/dL (ref 0.50–1.35)
Glucose, Bld: 83 mg/dL (ref 70–99)
Potassium: 3.9 mEq/L (ref 3.5–5.3)
Sodium: 142 mEq/L (ref 135–145)
Total Bilirubin: 0.5 mg/dL (ref 0.3–1.2)
Total Protein: 8.2 g/dL (ref 6.0–8.3)

## 2013-03-20 LAB — POCT SEDIMENTATION RATE: POCT SED RATE: 34 mm/hr — AB (ref 0–22)

## 2013-03-20 LAB — URIC ACID: Uric Acid, Serum: 8.6 mg/dL — ABNORMAL HIGH (ref 4.0–7.8)

## 2013-03-20 MED ORDER — COLCHICINE 0.6 MG PO TABS: 0.6000 mg | ORAL_TABLET | Freq: Two times a day (BID) | ORAL | Status: DC

## 2013-03-20 MED ORDER — MELOXICAM 15 MG PO TABS: 15.0000 mg | ORAL_TABLET | Freq: Every day | ORAL | Status: DC

## 2013-03-20 MED ORDER — COLCHICINE 0.6 MG PO TABS: 0.6000 mg | ORAL_TABLET | Freq: Every day | ORAL | Status: DC

## 2013-03-20 MED ORDER — TRIAMCINOLONE ACETONIDE 0.1 % EX CREA: TOPICAL_CREAM | CUTANEOUS | Status: DC

## 2013-03-20 MED ORDER — HYDROXYZINE HCL 25 MG PO TABS: 12.5000 mg | ORAL_TABLET | Freq: Three times a day (TID) | ORAL | Status: DC | PRN

## 2013-03-20 NOTE — Patient Instructions (Addendum)
When you get your prescriptions, make sure they give you the one with refills. I started to stand in a prescription with to lower dose and no refills, so are resubmitted it but I'm not sure that it properly got canceled.  Take the colchicine one twice daily for prevention of gout flares. If it makes her stools loose decrease to one daily  Take the meloxicam one daily with food unless it upsets your stomach.  Return in 3 months for recheck

## 2013-03-20 NOTE — Progress Notes (Signed)
52 year old man who has a history of having had gout for the past 7 years or so, rigidity diagnosed at [place]. He says his mother had gout. There is no apparent record of clear-cut crystal analysis diagnosis. He has had uric acid levels checked a couple times in the past which are recorded, and the uric acid level is been in the theoretically normal range. He thinks he did do better last year when he was on the colchicine.  He also has a rash in his antecubital fossa, right elbow worse than left. 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  His joints get to bother him bad enough at times he has to miss work her work sitting down. He is in danger of getting too many points against at work and losing his job, so he would like an Transport planner paperwork completed.  Objective: Pleasant alert gentleman in no major distress. Rash on both elbows in the soft tissue of the antecubital fossa. It is a maculopapular erythematous rash. His chest is clear. Heart regular without murmurs. Abdomen soft without hepatosplenomegaly. Extremities grossly normal. He did not have any deformities or acute inflammation at this time. Most of his pains have been in his ankles. He also had problems with his knees hurting him but there is no crepitance. He has had x-rays in the past the hospital.  Assessment: History of gout. I wonder whether this could in fact be pseudogout because of his normal uric acid levels. If he ever gets an acute flare would need an aspiration diagnosis. Contact or tightness in elbows, suspicious for fiberglass irritation Osteoarthritis of knees  Plan: Reviewed the records and the old chart. Fill out FMLA form. Check labs Placed him on regular dosing of colchicine which would help control both gout and pseudogout. Also keep him on a low-dose NSAID such as meloxicam

## 2013-04-01 MED ORDER — ALLOPURINOL 100 MG PO TABS: 100.0000 mg | ORAL_TABLET | Freq: Every day | ORAL | Status: DC

## 2013-04-01 NOTE — Addendum Note (Signed)
Addended by: Johnnette Litter on: 04/01/2013 02:39 PM   Modules accepted: Orders

## 2013-04-06 ENCOUNTER — Telehealth: Payer: Self-pay

## 2013-04-06 NOTE — Telephone Encounter (Signed)
No I do not have anything on him.

## 2013-04-06 NOTE — Telephone Encounter (Signed)
Think this is for [facility]

## 2013-04-06 NOTE — Telephone Encounter (Signed)
PT STATES HE DROPPED OFF A FORM FOR DR HOPPER TO FILL OUT AND HE REALLY NEED IT FAXED BEFORE THE DAY IS OVER PLEASE FAX TO [phone] ATTN: HOLLY HADDER AND IT IS A CERTIFICATION FORM. AND YOU MAY REACH PT AT [phone] IF NEEDED

## 2013-04-06 NOTE — Telephone Encounter (Signed)
I believe Alexander [name] was asking if you have seen this paperwork. Patient brought paperwork in for visit with Dr. Alwyn Ren and Dr. Alwyn Ren kept the paperwork to complete so it has not hit our FMLA/disabilities desk at all. They are not in Dr [facility] box so we do not know where they are and were wondering if you knew anything about it.

## 2013-05-21 ENCOUNTER — Encounter (HOSPITAL_COMMUNITY): Payer: Self-pay | Admitting: Emergency Medicine

## 2013-05-21 ENCOUNTER — Other Ambulatory Visit: Payer: Self-pay | Admitting: Physician Assistant

## 2013-05-21 ENCOUNTER — Emergency Department (HOSPITAL_COMMUNITY)
Admission: EM | Admit: 2013-05-21 | Discharge: 2013-05-21 | Disposition: A | Discharge status: Home / Self Care | Payer: BC Managed Care – PPO | Attending: Emergency Medicine | Admitting: Emergency Medicine

## 2013-05-21 DIAGNOSIS — E663 Overweight: Secondary | ICD-10-CM | POA: Insufficient documentation

## 2013-05-21 DIAGNOSIS — Z8719 Personal history of other diseases of the digestive system: Secondary | ICD-10-CM | POA: Insufficient documentation

## 2013-05-21 DIAGNOSIS — M109 Gout, unspecified: Secondary | ICD-10-CM | POA: Insufficient documentation

## 2013-05-21 DIAGNOSIS — Z87891 Personal history of nicotine dependence: Secondary | ICD-10-CM | POA: Insufficient documentation

## 2013-05-21 DIAGNOSIS — Z8739 Personal history of other diseases of the musculoskeletal system and connective tissue: Secondary | ICD-10-CM | POA: Insufficient documentation

## 2013-05-21 DIAGNOSIS — L259 Unspecified contact dermatitis, unspecified cause: Secondary | ICD-10-CM | POA: Insufficient documentation

## 2013-05-21 MED ORDER — PREDNISONE 20 MG PO TABS
60.0000 mg | ORAL_TABLET | Freq: Once | ORAL | Status: AC
  Administered 2013-05-21: 60 mg via ORAL
  Filled 2013-05-21: qty 3

## 2013-05-21 MED ORDER — HYDROCORTISONE 2.5 % EX LOTN: TOPICAL_LOTION | Freq: Two times a day (BID) | CUTANEOUS | Status: DC

## 2013-05-21 MED ORDER — PREDNISONE 20 MG PO TABS: 60.0000 mg | ORAL_TABLET | Freq: Every day | ORAL | Status: DC

## 2013-05-21 NOTE — ED Notes (Signed)
Pt presenting to ed with c/o rash to bilateral arms x 1 week with itching pt states he has been taking benadryl with no relief

## 2013-05-21 NOTE — ED Provider Notes (Signed)
CSN: [id number]     Arrival date & time 05/21/13  1656 History    This chart was scribed for Magnus Sinning, PA, working with Gerhard Munch, MD by Blanchard Kelch, ED Scribe. This patient was seen in room WTR7/WTR7 and the patient's care was started at 6:11 PM.    Chief Complaint  Patient presents with  . Rash    Patient is a 52 y.o. male presenting with rash. The history is provided by the patient. No language interpreter was used.  Rash Location:  Leg, shoulder/arm and head/neck Head/neck rash location:  R neck and L neck Shoulder/arm rash location:  L arm and R arm Leg rash location:  R leg and L leg Quality: itchiness and redness   Quality: not draining   Severity:  Moderate Onset quality:  Gradual Duration:  1 week Timing:  Constant Progression:  Worsening Chronicity:  New Context: not exposure to similar rash, not medications and not sick contacts   Relieved by:  Antihistamines (Benadryl, mild relief) Worsened by:  Nothing tried Associated symptoms: no fever, no nausea, no shortness of breath, no throat swelling, no tongue swelling and not wheezing     HPI Comments: Alexander [name] is a 52 y.o. male who presents to the Emergency Department complaining of arm, neck and lower leg rash that began about a week ago. The rash was described as starting out similar to a "heat rash." The patient describes the rash as itching and red. Patient has been taking benadryl with mild relief of itching. Patient has also been applying lotion and taking cold showers to relieve itching. Patient denies any new lotions, soaps, detergents or medications.Patient denies sick contacts with rash. Patient denies having a previous similar rash. Patient denies fever, chills, shortness of breath, swelling of lips, tongue or throat.   Past Medical History  Diagnosis Date  . Gout   . History of gastroesophageal reflux (GERD)   . Overweight (BMI 25.0-29.9)   . Gout 2007  . Diverticulitis 2012  . Allergy    . Arthritis    Past Surgical History  Procedure Laterality Date  . None    . Hemorrhoid     Family History  Problem Relation Age of Onset  . Hypertension Mother   . Gout Mother   . Hypertension Father   . Diabetes Maternal Aunt   . Diabetes Paternal Aunt   . Hypertension Sister   . Hypertension Brother    History  Substance Use Topics  . Smoking status: Former Smoker -- 1.00 packs/day    Types: Cigarettes  . Smokeless tobacco: Never Used     Comment: quit 1990's  . Alcohol Use: No     Comment: occassional    Review of Systems  Constitutional: Negative for fever and chills.  Respiratory: Negative for shortness of breath and wheezing.   Gastrointestinal: Negative for nausea.  Skin: Positive for rash (arms, legs, neck).       No lip, tongue, throat swelling.  All other systems reviewed and are negative.    Allergies  Review of patient's allergies indicates no known allergies.  Home Medications   Current Outpatient Rx  Name  Route  Sig  Dispense  Refill  . cetirizine (ZYRTEC) 10 MG tablet   Oral   Take 1 tablet (10 mg total) by mouth daily.   30 tablet   11   . colchicine 0.6 MG tablet   Oral   Take 1 tablet (0.6 mg total) by mouth 2 (two)  times daily.   60 tablet   2   . diphenhydrAMINE (BENADRYL) 25 MG tablet   Oral   Take 50 mg by mouth every 6 (six) hours as needed for itching (itching).         Marland Kitchen ibuprofen (ADVIL,MOTRIN) 200 MG tablet   Oral   Take 800 mg by mouth every 6 (six) hours as needed for pain (pain).         . indomethacin (INDOCIN) 50 MG capsule   Oral   Take 1 capsule (50 mg total) by mouth 3 (three) times daily with meals.   90 capsule   1    Triage Vitals: BP 151/82  Pulse 103  Temp(Src) 98.6 F (37 C) (Oral)  Resp 18  SpO2 97%  Physical Exam  Nursing note and vitals reviewed. Constitutional: He is oriented to person, place, and time. He appears well-developed and well-nourished.  HENT:  Head: Normocephalic and  atraumatic.  Mouth/Throat: Oropharynx is clear and moist.  No swelling of lips, tongue, or throat.  Eyes: Conjunctivae and EOM are normal.  Neck: Normal range of motion. Neck supple.  Cardiovascular: Normal rate, regular rhythm and normal heart sounds.   Pulmonary/Chest: Effort normal and breath sounds normal.  Neurological: He is alert and oriented to person, place, and time.  Skin: Skin is warm and dry. Rash noted.  Diffuse, erythematous, papular rash on upper and lower arms, lower legs bilaterally. No petechiae or pupura. No drainage.  Psychiatric: He has a normal mood and affect.    ED Course  DIAGNOSTIC STUDIES:  Oxygen Saturation is 97% on room air, normal by my interpretation.    COORDINATION OF CARE:  6:16 PM - Clinical suspicion of poison ivy or new exposure that is causing reaction. Will prescribe mediations to treat rash. Patient verbalizes understanding and agrees with treatment plan.  Medications  predniSONE (DELTASONE) tablet 60 mg (60 mg Oral Given 05/21/13 1827)    Procedures (including critical care time)  Labs Reviewed - No data to display  No results found.  1. Contact dermatitis     MDM  Patient presenting with rash to the forearms bilaterally and lower legs bilaterally.  Rash most consistent with Contact Dermatitis, probable Poison Ivy.  Patient without swelling of lips, tongue, or throat.  No SOB.  Patient instructed to use Hydrocortisone, Benadryl, and given 5 day course of Prednisone.  Return precautions given.  I personally performed the services described in this documentation, which was scribed in my presence. The recorded information has been reviewed and is accurate.   Pascal Lux Deale, PA-C 05/22/13 1016

## 2013-05-22 ENCOUNTER — Telehealth: Payer: Self-pay

## 2013-05-22 NOTE — ED Provider Notes (Signed)
  Medical screening examination/treatment/procedure(s) were performed by non-physician practitioner and as supervising physician I was immediately available for consultation/collaboration.    Gerhard Munch, MD 05/22/13 [phone]

## 2013-06-17 ENCOUNTER — Other Ambulatory Visit: Payer: Self-pay | Admitting: Physician Assistant

## 2013-07-15 ENCOUNTER — Other Ambulatory Visit: Payer: Self-pay | Admitting: Physician Assistant

## 2013-07-16 ENCOUNTER — Other Ambulatory Visit: Payer: Self-pay

## 2013-07-16 DIAGNOSIS — L309 Dermatitis, unspecified: Secondary | ICD-10-CM

## 2013-07-16 NOTE — Telephone Encounter (Signed)
Patient calling to get a refill on medication for itching that he was seen by Chelle on 03/20/13. He describes it as a white pill for itching. He does not know the name of it. It was not familiar from his medication list. Please call patient to let him know if this is possible.   Patient uses [id number] [place], [place] - [address]  Best: [phone]

## 2013-07-17 ENCOUNTER — Telehealth: Payer: Self-pay

## 2013-07-17 MED ORDER — HYDROXYZINE HCL 25 MG PO TABS: 12.5000 mg | ORAL_TABLET | Freq: Three times a day (TID) | ORAL | Status: DC | PRN

## 2013-07-17 NOTE — Telephone Encounter (Signed)
Duplicate, have sent this request already.

## 2013-07-17 NOTE — Telephone Encounter (Signed)
Hydroxyzine, is what he is requesting pended please advise,

## 2013-07-17 NOTE — Telephone Encounter (Signed)
Pt calling for prescription to relieve itching from eczema, he is not sure of the name. Called in last night about it also. [facility] [address]. Call at [phone].

## 2013-08-18 ENCOUNTER — Telehealth: Payer: Self-pay

## 2013-08-21 ENCOUNTER — Encounter: Payer: Self-pay | Admitting: Family Medicine

## 2013-09-23 ENCOUNTER — Emergency Department (HOSPITAL_COMMUNITY)
Admission: EM | Admit: 2013-09-23 | Discharge: 2013-09-23 | Disposition: A | Discharge status: Home / Self Care | Payer: BC Managed Care – PPO | Attending: Emergency Medicine | Admitting: Emergency Medicine

## 2013-09-23 ENCOUNTER — Encounter (HOSPITAL_COMMUNITY): Payer: Self-pay | Admitting: Emergency Medicine

## 2013-09-23 DIAGNOSIS — L02419 Cutaneous abscess of limb, unspecified: Secondary | ICD-10-CM | POA: Insufficient documentation

## 2013-09-23 DIAGNOSIS — Z87891 Personal history of nicotine dependence: Secondary | ICD-10-CM | POA: Insufficient documentation

## 2013-09-23 DIAGNOSIS — Z8719 Personal history of other diseases of the digestive system: Secondary | ICD-10-CM | POA: Insufficient documentation

## 2013-09-23 DIAGNOSIS — L0291 Cutaneous abscess, unspecified: Secondary | ICD-10-CM

## 2013-09-23 DIAGNOSIS — Z79899 Other long term (current) drug therapy: Secondary | ICD-10-CM | POA: Insufficient documentation

## 2013-09-23 DIAGNOSIS — IMO0002 Reserved for concepts with insufficient information to code with codable children: Secondary | ICD-10-CM | POA: Insufficient documentation

## 2013-09-23 DIAGNOSIS — M109 Gout, unspecified: Secondary | ICD-10-CM | POA: Insufficient documentation

## 2013-09-23 DIAGNOSIS — E663 Overweight: Secondary | ICD-10-CM | POA: Insufficient documentation

## 2013-09-23 MED ORDER — CEPHALEXIN 500 MG PO CAPS: 500.0000 mg | ORAL_CAPSULE | Freq: Four times a day (QID) | ORAL | Status: DC

## 2013-09-23 MED ORDER — HYDROCODONE-ACETAMINOPHEN 5-325 MG PO TABS: 1.0000 | ORAL_TABLET | Freq: Four times a day (QID) | ORAL | Status: DC | PRN

## 2013-09-23 MED ORDER — SULFAMETHOXAZOLE-TMP DS 800-160 MG PO TABS: 1.0000 | ORAL_TABLET | Freq: Two times a day (BID) | ORAL | Status: DC

## 2013-09-23 NOTE — ED Notes (Signed)
Per pt, has had left lower calf abscess, swelling, painful to touch

## 2013-09-23 NOTE — ED Provider Notes (Signed)
CSN: [id number]     Arrival date & time 09/23/13  1235 History  This chart was scribed for non-physician practitioner Arthor Captain, working with Ethelda Chick, MD by Nicholos Johns, ED scribe. This patient was seen in room WTR8/WTR8 and the patient's care was started at 1:43 PM.  Chief Complaint  Patient presents with  . Abscess   Patient is a 52 y.o. male presenting with abscess. The history is provided by the patient. No language interpreter was used.  Abscess Location:  Leg Leg abscess location:  L lower leg Abscess quality: fluctuance, induration and redness   Abscess quality: not draining   Duration:  1 week Chronicity:  New Context: not diabetes and not injected drug use   Associated symptoms: no fever    HPI Comments: Alexander [name] is a 52 y.o. male w/hx of Gout who presents to the Emergency Department complaining of a gradually worsening abscess to inner left calf w/ associated pain and swelling, onset 1 week ago. Pt states he just woke up one morning and it was there. Pt denies fever chills, or body aches. Pt denies use of IV drugs. Pt is not hypertensive or diabetic.   PCP: Dr. Clelia Croft  Past Medical History  Diagnosis Date  . Gout   . History of gastroesophageal reflux (GERD)   . Overweight (BMI 25.0-29.9)   . Gout 2007  . Diverticulitis 2012  . Allergy   . Arthritis    Past Surgical History  Procedure Laterality Date  . None    . Hemorrhoid     Family History  Problem Relation Age of Onset  . Hypertension Mother   . Gout Mother   . Hypertension Father   . Diabetes Maternal Aunt   . Diabetes Paternal Aunt   . Hypertension Sister   . Hypertension Brother    History  Substance Use Topics  . Smoking status: Former Smoker -- 1.00 packs/day    Types: Cigarettes  . Smokeless tobacco: Never Used     Comment: quit 1990's  . Alcohol Use: No     Comment: occassional    Review of Systems  Constitutional: Negative for fever and chills.  Musculoskeletal:        No body aches  Skin:       Abscess to mid left calf w/ pain  All other systems reviewed and are negative.   Allergies  Review of patient's allergies indicates no known allergies.  Home Medications   Current Outpatient Rx  Name  Route  Sig  Dispense  Refill  . colchicine 0.6 MG tablet   Oral   Take 1 tablet (0.6 mg total) by mouth 2 (two) times daily.   60 tablet   2   . hydrocortisone 2.5 % lotion   Topical   Apply topically 2 (two) times daily.   59 mL   0   . hydrOXYzine (ATARAX/VISTARIL) 25 MG tablet   Oral   Take 0.5-1 tablets (12.5-25 mg total) by mouth every 8 (eight) hours as needed for itching.   60 tablet   0   . ibuprofen (ADVIL,MOTRIN) 200 MG tablet   Oral   Take 800 mg by mouth every 6 (six) hours as needed for pain (pain).         . indomethacin (INDOCIN) 50 MG capsule   Oral   Take 1 capsule (50 mg total) by mouth 3 (three) times daily with meals.   90 capsule   1   . indomethacin (INDOCIN)  50 MG capsule      TAKE 1 CAPSULE 3 TIMES DAILY WITH MEALS.   90 capsule   1   . indomethacin (INDOCIN) 50 MG capsule      TAKE 1 CAPSULE 3 TIMES DAILY WITH MEALS.   90 capsule   1   . indomethacin (INDOCIN) 50 MG capsule      TAKE 1 CAPSULE 3 TIMES DAILY WITH MEALS.   90 capsule   1   . predniSONE (DELTASONE) 20 MG tablet   Oral   Take 3 tablets (60 mg total) by mouth daily.   12 tablet   0    Triage Vitals: BP 128/80  Temp(Src) 98.6 F (37 C) (Oral)  Resp 16  SpO2 96% Physical Exam  Nursing note and vitals reviewed. Constitutional: He is oriented to person, place, and time. He appears well-developed and well-nourished.  HENT:  Head: Normocephalic and atraumatic.  Eyes: EOM are normal.  Neck: Neck supple. No tracheal deviation present.  Cardiovascular: Normal rate.   Pulmonary/Chest: Effort normal. No respiratory distress.  Musculoskeletal: Normal range of motion.  Neurological: He is alert and oriented to person, place, and  time.  distal pulses and sensation intact.  Skin: Skin is warm and dry.  LEFT CALF: 5 cm area of fluctuance surrounded by induration. Signs of venous tattooing in lower extremity with some mild pitting edema to mid calf.   Psychiatric: He has a normal mood and affect. His behavior is normal.    ED Course  Procedures  DIAGNOSTIC STUDIES: Oxygen Saturation is 96% on room air, adequate by my interpretation.    COORDINATION OF CARE: At 1:49 PM: Discussed treatment plan with patient which includes drainage of the abscess. Pt advised to come back in 2 days to get Iodoform packing tape replaced with new strip in abscess site. Patient agrees.   INCISION AND DRAINAGE Performed by: Arthor Captain, PA Consent: Verbal consent obtained. Risks and benefits: risks, benefits and alternatives were discussed Type: abscess  Body area: Left mid calf  Anesthesia: local infiltration  Incision was made with a scalpel.  Local anesthetic: lidocaine 2% w/ epinephrine  Anesthetic total: 2 ml  Complexity: complex Blunt dissection to break up loculations  Drainage: purulent  Drainage amount: moderate  Packing material: 1/4 in iodoform gauze  Patient tolerance: Patient tolerated the procedure well with no immediate complications.   Labs Review Labs Reviewed - No data to display Imaging Review No results found.  EKG Interpretation   None       MDM   1. Abscess   Patient with skin abscess amenable to incision and drainage.  wound recheck in 2 days. Encouraged home warm soaks and flushing.  Mild signs of cellulitis is surrounding skin.  Will d/c to home.  WIll d/c with abx. I personally performed the services described in this documentation, which was scribed in my presence. The recorded information has been reviewed and is accurate.       Arthor Captain, PA-C 09/23/13 1438

## 2013-09-23 NOTE — ED Provider Notes (Signed)
Medical screening examination/treatment/procedure(s) were performed by non-physician practitioner and as supervising physician I was immediately available for consultation/collaboration.  EKG Interpretation   None        Ethelda Chick, MD 09/23/13 1441

## 2013-09-25 ENCOUNTER — Emergency Department (HOSPITAL_COMMUNITY)
Admission: EM | Admit: 2013-09-25 | Discharge: 2013-09-25 | Disposition: A | Discharge status: Home / Self Care | Payer: BC Managed Care – PPO | Attending: Emergency Medicine | Admitting: Emergency Medicine

## 2013-09-25 ENCOUNTER — Encounter (HOSPITAL_COMMUNITY): Payer: Self-pay | Admitting: Emergency Medicine

## 2013-09-25 DIAGNOSIS — Z792 Long term (current) use of antibiotics: Secondary | ICD-10-CM | POA: Insufficient documentation

## 2013-09-25 DIAGNOSIS — D619 Aplastic anemia, unspecified: Secondary | ICD-10-CM | POA: Insufficient documentation

## 2013-09-25 DIAGNOSIS — Z87891 Personal history of nicotine dependence: Secondary | ICD-10-CM | POA: Insufficient documentation

## 2013-09-25 DIAGNOSIS — M129 Arthropathy, unspecified: Secondary | ICD-10-CM | POA: Insufficient documentation

## 2013-09-25 DIAGNOSIS — Z4800 Encounter for change or removal of nonsurgical wound dressing: Secondary | ICD-10-CM

## 2013-09-25 DIAGNOSIS — E663 Overweight: Secondary | ICD-10-CM | POA: Insufficient documentation

## 2013-09-25 DIAGNOSIS — Z79899 Other long term (current) drug therapy: Secondary | ICD-10-CM | POA: Insufficient documentation

## 2013-09-25 DIAGNOSIS — Z4801 Encounter for change or removal of surgical wound dressing: Secondary | ICD-10-CM | POA: Insufficient documentation

## 2013-09-25 DIAGNOSIS — Z8719 Personal history of other diseases of the digestive system: Secondary | ICD-10-CM | POA: Insufficient documentation

## 2013-09-25 NOTE — ED Notes (Signed)
Pt presents to the ED for a wound evaluation and cleaning and re-wrapping.  Pt was seen here two days ago and is here to have his wound reevaluated

## 2013-09-25 NOTE — ED Provider Notes (Signed)
CSN: [id number]     Arrival date & time 09/25/13  4696 History   First MD Initiated Contact with Patient 09/25/13 [phone]     Chief Complaint  Patient presents with  . Wound Check   (Consider location/radiation/quality/duration/timing/severity/associated sxs/prior Treatment) HPI Comments: Patient is a 52 year old male who presents to the emergency department for packing removal from an abscess that was placed on his left calf 2 days ago. States he has not had any problems, no pain, no fevers. He has been changing the dressings as directed. Take antibiotics Keflex and Bactrim as prescribed.  Patient is a 52 y.o. male presenting with wound check. The history is provided by the patient.  Wound Check    Past Medical History  Diagnosis Date  . Gout   . History of gastroesophageal reflux (GERD)   . Overweight (BMI 25.0-29.9)   . Gout 2007  . Diverticulitis 2012  . Allergy   . Arthritis    Past Surgical History  Procedure Laterality Date  . None    . Hemorrhoid     Family History  Problem Relation Age of Onset  . Hypertension Mother   . Gout Mother   . Hypertension Father   . Diabetes Maternal Aunt   . Diabetes Paternal Aunt   . Hypertension Sister   . Hypertension Brother    History  Substance Use Topics  . Smoking status: Former Smoker -- 1.00 packs/day    Types: Cigarettes  . Smokeless tobacco: Never Used     Comment: quit 1990's  . Alcohol Use: No     Comment: occassional    Review of Systems  Skin: Positive for wound.  All other systems reviewed and are negative.    Allergies  Review of patient's allergies indicates no known allergies.  Home Medications   Current Outpatient Rx  Name  Route  Sig  Dispense  Refill  . cephALEXin (KEFLEX) 500 MG capsule   Oral   Take 1 capsule (500 mg total) by mouth 4 (four) times daily.   40 capsule   0   . colchicine 0.6 MG tablet   Oral   Take 1 tablet (0.6 mg total) by mouth 2 (two) times daily.   60 tablet   2    . HYDROcodone-acetaminophen (NORCO) 5-325 MG per tablet   Oral   Take 1-2 tablets by mouth every 6 (six) hours as needed for moderate pain.   20 tablet   0   . ibuprofen (ADVIL,MOTRIN) 200 MG tablet   Oral   Take 800 mg by mouth every 6 (six) hours as needed for pain (pain).         . indomethacin (INDOCIN) 50 MG capsule   Oral   Take 1 capsule (50 mg total) by mouth 3 (three) times daily with meals.   90 capsule   1   . sulfamethoxazole-trimethoprim (BACTRIM DS) 800-160 MG per tablet   Oral   Take 1 tablet by mouth 2 (two) times daily.   20 tablet   0    BP 151/85  Pulse 94  Temp(Src) 98.1 F (36.7 C) (Oral)  Resp 16  SpO2 97% Physical Exam  Nursing note and vitals reviewed. Constitutional: He is oriented to person, place, and time. He appears well-developed and well-nourished. No distress.  HENT:  Head: Normocephalic and atraumatic.  Mouth/Throat: Oropharynx is clear and moist.  Eyes: Conjunctivae are normal.  Neck: Normal range of motion. Neck supple.  Cardiovascular: Normal rate, regular rhythm and normal  heart sounds.   Pulmonary/Chest: Effort normal and breath sounds normal.  Musculoskeletal: Normal range of motion. He exhibits no edema.  Neurological: He is alert and oriented to person, place, and time.  Skin: Skin is warm and dry. He is not diaphoretic.  2 cm diameter area of induration, packing in place. No erythema or warmth. No edema.  Psychiatric: He has a normal mood and affect. His behavior is normal.    ED Course  Procedures (including critical care time)  Labs Review Labs Reviewed - No data to display Imaging Review No results found.  EKG Interpretation   None       MDM   1. Abscess packing removal    Patient presenting for packing removal. Wound is well appearing, packing removed, no drainage. No evidence of cellulitis. Stable for discharge. Return precautions given. Patient states understanding of treatment care plan and is  agreeable.   Trevor Mace, PA-C 09/25/13 [phone]

## 2013-10-01 NOTE — ED Provider Notes (Signed)
Medical screening examination/treatment/procedure(s) were performed by non-physician practitioner and as supervising physician I was immediately available for consultation/collaboration.  EKG Interpretation   None         David H Yao, MD 10/01/13 1453 

## 2013-10-12 ENCOUNTER — Other Ambulatory Visit: Payer: Self-pay | Admitting: Internal Medicine

## 2013-11-19 ENCOUNTER — Other Ambulatory Visit: Payer: Self-pay | Admitting: Physician Assistant

## 2013-11-20 ENCOUNTER — Other Ambulatory Visit: Payer: Self-pay | Admitting: Physician Assistant

## 2013-11-25 ENCOUNTER — Telehealth: Payer: Self-pay

## 2013-11-25 ENCOUNTER — Other Ambulatory Visit: Payer: Self-pay | Admitting: Physician Assistant

## 2013-11-25 DIAGNOSIS — M25579 Pain in unspecified ankle and joints of unspecified foot: Secondary | ICD-10-CM

## 2013-11-25 NOTE — Telephone Encounter (Signed)
Dr Alwyn RenHopper, we have actually denied this RF to pharm twice w/note that pt needs OV, but evidently pharm has not advised pt. Do you want to give him 1 RF w/us advising pt that he needs OV for more?

## 2013-11-25 NOTE — Telephone Encounter (Signed)
PATIENT STATES HE HAS BEEN TRYING TO GET A REFILL ON HIS INDOMETHACIN 50 MG SINCE Friday. NOW HE IS COMPLETELY OUT. HE IS HAVING A GOUT FLARE-UP OF HIS ANKLE AND NEEDS THIS MEDICINE AS SOON AS POSSIBLE PLEASE. BEST PHONE - PATIENT CANNOT GET PHONE CALLS AT WORK AND HIS CELL PHONE IS NOT WORKING. HE SAID HE WILL TRY TO CALL US BACK AROUND 1:00 TODAY. PHARMACY CHOICE IS [facility] ON [address].  MBC

## 2013-11-26 NOTE — Telephone Encounter (Signed)
Spoke w/Dr Alwyn RenHopper who reviewed notes and advised he needs to see pt again before Rxing. Notified pt.

## 2013-12-01 ENCOUNTER — Encounter (HOSPITAL_COMMUNITY): Payer: Self-pay | Admitting: Emergency Medicine

## 2013-12-01 ENCOUNTER — Emergency Department (HOSPITAL_COMMUNITY)
Admission: EM | Admit: 2013-12-01 | Discharge: 2013-12-01 | Disposition: A | Discharge status: Home / Self Care | Payer: BC Managed Care – PPO | Attending: Emergency Medicine | Admitting: Emergency Medicine

## 2013-12-01 DIAGNOSIS — M109 Gout, unspecified: Secondary | ICD-10-CM | POA: Insufficient documentation

## 2013-12-01 DIAGNOSIS — B359 Dermatophytosis, unspecified: Secondary | ICD-10-CM | POA: Insufficient documentation

## 2013-12-01 DIAGNOSIS — Z8719 Personal history of other diseases of the digestive system: Secondary | ICD-10-CM | POA: Insufficient documentation

## 2013-12-01 DIAGNOSIS — Z87891 Personal history of nicotine dependence: Secondary | ICD-10-CM | POA: Insufficient documentation

## 2013-12-01 DIAGNOSIS — G8929 Other chronic pain: Secondary | ICD-10-CM | POA: Insufficient documentation

## 2013-12-01 DIAGNOSIS — E663 Overweight: Secondary | ICD-10-CM | POA: Insufficient documentation

## 2013-12-01 DIAGNOSIS — Z6829 Body mass index (BMI) 29.0-29.9, adult: Secondary | ICD-10-CM | POA: Insufficient documentation

## 2013-12-01 DIAGNOSIS — Z79899 Other long term (current) drug therapy: Secondary | ICD-10-CM | POA: Insufficient documentation

## 2013-12-01 MED ORDER — COLCHICINE 0.6 MG PO TABS
1.2000 mg | ORAL_TABLET | Freq: Once | ORAL | Status: AC
  Administered 2013-12-01: 1.2 mg via ORAL
  Filled 2013-12-01: qty 2

## 2013-12-01 MED ORDER — KETOCONAZOLE 2 % EX CREA: 1.0000 "application " | TOPICAL_CREAM | Freq: Every day | CUTANEOUS | Status: DC

## 2013-12-01 MED ORDER — HYDROCODONE-ACETAMINOPHEN 5-325 MG PO TABS: 1.0000 | ORAL_TABLET | ORAL | Status: DC | PRN

## 2013-12-01 MED ORDER — COLCHICINE 0.6 MG PO TABS: 0.6000 mg | ORAL_TABLET | Freq: Every day | ORAL | Status: DC

## 2013-12-01 NOTE — ED Provider Notes (Signed)
CSN: [id number]     Arrival date & time 12/01/13  2006 History   First MD Initiated Contact with Patient 12/01/13 2137     Chief Complaint  Patient presents with  . Gout     (Consider location/radiation/quality/duration/timing/severity/associated sxs/prior Treatment) HPI History provided by pt.   Pt presents w/ typical gout flare x 3-4 days.  Severe pain, edema, erythema and warmth at bilateral MCP joints and mild edema/pain bilateral ankles.  Tenderness to light palpation; can not cover feet with bed blankets.  Pain also aggravated by bearing weight.  No associated fever or paresthesias.  Takes colchicine but ran out last week.  Has been taking topical NSAID w/out relief.  Also c/o severely pruritic rash to bilateral lower legs x 2 weeks.  Has had this rash in the past as well.  Has been applying hydrocortisone cream but it seems to be worsening.  No known allergies or new contacts.  Past Medical History  Diagnosis Date  . Gout   . History of gastroesophageal reflux (GERD)   . Overweight (BMI 25.0-29.9)   . Gout 2007  . Diverticulitis 2012  . Allergy   . Arthritis    Past Surgical History  Procedure Laterality Date  . None    . Hemorrhoid     Family History  Problem Relation Age of Onset  . Hypertension Mother   . Gout Mother   . Hypertension Father   . Diabetes Maternal Aunt   . Diabetes Paternal Aunt   . Hypertension Sister   . Hypertension Brother    History  Substance Use Topics  . Smoking status: Former Smoker -- 1.00 packs/day    Types: Cigarettes  . Smokeless tobacco: Never Used     Comment: quit 1990's  . Alcohol Use: 1.8 oz/week    3 Cans of beer per week     Comment: occassional    Review of Systems  All other systems reviewed and are negative.      Allergies  Review of patient's allergies indicates no known allergies.  Home Medications   Current Outpatient Rx  Name  Route  Sig  Dispense  Refill  . colchicine 0.6 MG tablet   Oral   Take 1  tablet (0.6 mg total) by mouth 2 (two) times daily.   60 tablet   2   . colchicine 0.6 MG tablet   Oral   Take 1 tablet (0.6 mg total) by mouth daily.   30 tablet   0   . HYDROcodone-acetaminophen (NORCO/VICODIN) 5-325 MG per tablet   Oral   Take 1 tablet by mouth every 4 (four) hours as needed for moderate pain.   20 tablet   0   . ketoconazole (NIZORAL) 2 % cream   Topical   Apply 1 application topically daily.   15 g   0    BP 134/97  Pulse 90  Temp(Src) 97.8 F (36.6 C) (Oral)  Resp 16  Ht 5\' 10"  (1.778 m)  Wt 192 lb (87.091 kg)  BMI 27.55 kg/m2  SpO2 97% Physical Exam  Nursing note and vitals reviewed. Constitutional: He is oriented to person, place, and time. He appears well-developed and well-nourished. No distress.  HENT:  Head: Normocephalic and atraumatic.  Eyes:  Normal appearance  Neck: Normal range of motion.  Cardiovascular: Normal rate and regular rhythm.   Pulmonary/Chest: Effort normal and breath sounds normal. No respiratory distress.  Musculoskeletal: Normal range of motion.  Erythema, warmth and tenderness to light palpation  bilateral 1st MCP joints on dorsal surface.  Pain w/ passive flexion R 1st MCP joint only.  No obvious edema feet/ankles and minimal tenderness of bilateral medial/lateral malleolus.  No pain w/ ROM.    Neurological: He is alert and oriented to person, place, and time.  Skin: Skin is warm and dry. No rash noted.  Splotchy, convalescent hyperpigmented macules of various sizes circumferential lower legs.  Several tiny scabs and excoriations.  No edema, warmth or ttp.    Psychiatric: He has a normal mood and affect. His behavior is normal.    ED Course  Procedures (including critical care time) Labs Review Labs Reviewed  CBG MONITORING, ED   Imaging Review No results found.   EKG Interpretation None      MDM   Final diagnoses:  Gout attack  Tinea    [name] presents w/ acute on chronic, typical gout pain  bilateral 1st MCP joints.  Ran out of colchicine last week and has not had relief w/ topical NSAID.  Exam is most consistent w/ gout exacerbation.  He received 1.2mg  colchicine in ED and d/c'd home w/ same + vicodin.  Instructed him to take next dose of colchicine in 1 hour.  Also c/o 2 weeks pruritic rash bilateral lower legs that is refractory to hydrocortisone cream and seems to be worsening.  Has had same in past.  Suspect fungal infection.  No signs of secondary bacterial infection.  Cbg 92.  Prescribed lotrimin and recommended benadryl, cool compresses and avoidance of scratching.  Referred to dermatology for persistent/worsening sx.  Return precautions discussed.     Otilio Miuatherine E Trayvon Trumbull, PA-C 12/02/13 2045

## 2013-12-01 NOTE — Discharge Instructions (Signed)
Take colchicine as prescribed.  Take vicodin as prescribed for severe pain.  Do not drive within four hours of taking this medication (may cause drowsiness or confusion).   Apply anti-fungal cream as prescribed until your symptoms have started to improve.  If not relief in 2 weeks, follow up with a dermatologist.  Return to the ER if you develop fever, worsening pain in toes/ankles, pain or swelling in your lower legs.

## 2013-12-01 NOTE — ED Notes (Signed)
Pt states a hx of gout, has been out of medication d/t his PCP being on maternity leave and not having the money to come in to see a new PCP. Pt states that his ankles and big toes have been swelling over the past week and the pain has become unbearable. Pt also states he believes he was bit to the L side of his neck, small raised reddened area noted to pt's neck at this time. Pt a&o x4.

## 2013-12-02 LAB — CBG MONITORING, ED: Glucose-Capillary: 92 mg/dL (ref 70–99)

## 2013-12-04 NOTE — ED Provider Notes (Signed)
Medical screening examination/treatment/procedure(s) were performed by non-physician practitioner and as supervising physician I was immediately available for consultation/collaboration.   EKG Interpretation None        Tran Randle T Ihor Meinzer, MD 12/04/13 1407 

## 2013-12-11 ENCOUNTER — Ambulatory Visit (INDEPENDENT_AMBULATORY_CARE_PROVIDER_SITE_OTHER): Payer: BC Managed Care – PPO | Admitting: Family Medicine

## 2013-12-11 ENCOUNTER — Ambulatory Visit: Payer: BC Managed Care – PPO

## 2013-12-11 VITALS — BP 138/90 | HR 83 | Temp 97.7°F | Resp 18 | Wt 209.0 lb

## 2013-12-11 DIAGNOSIS — L309 Dermatitis, unspecified: Secondary | ICD-10-CM

## 2013-12-11 DIAGNOSIS — R195 Other fecal abnormalities: Secondary | ICD-10-CM

## 2013-12-11 DIAGNOSIS — M1990 Unspecified osteoarthritis, unspecified site: Secondary | ICD-10-CM

## 2013-12-11 DIAGNOSIS — M129 Arthropathy, unspecified: Secondary | ICD-10-CM

## 2013-12-11 DIAGNOSIS — L259 Unspecified contact dermatitis, unspecified cause: Secondary | ICD-10-CM

## 2013-12-11 DIAGNOSIS — M109 Gout, unspecified: Secondary | ICD-10-CM

## 2013-12-11 DIAGNOSIS — Z0000 Encounter for general adult medical examination without abnormal findings: Secondary | ICD-10-CM

## 2013-12-11 LAB — POCT CBC
Granulocyte percent: 56.5 %G (ref 37–80)
HCT, POC: 43.8 % (ref 43.5–53.7)
Hemoglobin: 14.2 g/dL (ref 14.1–18.1)
Lymph, poc: 1.7 (ref 0.6–3.4)
MCH, POC: 31.8 pg — AB (ref 27–31.2)
MCHC: 32.4 g/dL (ref 31.8–35.4)
MCV: 98.1 fL — AB (ref 80–97)
MID (cbc): 0.5 (ref 0–0.9)
MPV: 8 fL (ref 0–99.8)
POC Granulocyte: 2.9 (ref 2–6.9)
POC LYMPH PERCENT: 33.1 %L (ref 10–50)
POC MID %: 10.4 %M (ref 0–12)
Platelet Count, POC: 306 10*3/uL (ref 142–424)
RBC: 4.46 M/uL — AB (ref 4.69–6.13)
RDW, POC: 14.7 %
WBC: 5.2 10*3/uL (ref 4.6–10.2)

## 2013-12-11 LAB — POCT URINALYSIS DIPSTICK
Bilirubin, UA: NEGATIVE
Blood, UA: NEGATIVE
Glucose, UA: NEGATIVE
Ketones, UA: NEGATIVE
Leukocytes, UA: NEGATIVE
Nitrite, UA: NEGATIVE
Protein, UA: NEGATIVE
Spec Grav, UA: 1.025
Urobilinogen, UA: 1
pH, UA: 6.5

## 2013-12-11 LAB — COMPREHENSIVE METABOLIC PANEL
ALT: 48 U/L (ref 0–53)
AST: 31 U/L (ref 0–37)
Albumin: 4.6 g/dL (ref 3.5–5.2)
Alkaline Phosphatase: 49 U/L (ref 39–117)
BUN: 10 mg/dL (ref 6–23)
CO2: 26 mEq/L (ref 19–32)
Calcium: 10 mg/dL (ref 8.4–10.5)
Chloride: 102 mEq/L (ref 96–112)
Creat: 0.84 mg/dL (ref 0.50–1.35)
Glucose, Bld: 74 mg/dL (ref 70–99)
Potassium: 3.8 mEq/L (ref 3.5–5.3)
Sodium: 139 mEq/L (ref 135–145)
Total Bilirubin: 0.3 mg/dL (ref 0.2–1.2)
Total Protein: 7.6 g/dL (ref 6.0–8.3)

## 2013-12-11 LAB — URIC ACID: Uric Acid, Serum: 7.1 mg/dL (ref 4.0–7.8)

## 2013-12-11 LAB — LIPID PANEL
Cholesterol: 149 mg/dL (ref 0–200)
HDL: 48 mg/dL (ref >39)
LDL Cholesterol: 84 mg/dL (ref 0–99)
Total CHOL/HDL Ratio: 3.1 Ratio
Triglycerides: 85 mg/dL (ref <150)
VLDL: 17 mg/dL (ref 0–40)

## 2013-12-11 LAB — IFOBT (OCCULT BLOOD): IFOBT: POSITIVE

## 2013-12-11 LAB — POCT GLYCOSYLATED HEMOGLOBIN (HGB A1C): Hemoglobin A1C: 5.7

## 2013-12-11 MED ORDER — HYDROXYZINE PAMOATE 25 MG PO CAPS: ORAL_CAPSULE | ORAL | Status: DC

## 2013-12-11 MED ORDER — TRIAMCINOLONE ACETONIDE 0.025 % EX LOTN: TOPICAL_LOTION | CUTANEOUS | Status: DC

## 2013-12-11 MED ORDER — DICLOFENAC SODIUM 75 MG PO TBEC: 75.0000 mg | DELAYED_RELEASE_TABLET | Freq: Two times a day (BID) | ORAL | Status: DC

## 2013-12-11 NOTE — Patient Instructions (Signed)
Take the hydroxyzine one at bedtime for itching and rest. May take another one after work, but it may make your little drowsy in the evening.  While awaiting dermatology referral you can use some triamcinolone lotion on the and areas of rash. Dermatology referral is pending  See a gastroenterologist for your colonoscopy. Referral has been made  Take the diclofenac one twice daily for joint pain and inflammation  I will communicate with you in a few days after we get her labs. If you do not hear from us by Tuesday or Wednesday get back to us

## 2013-12-11 NOTE — Progress Notes (Signed)
Physical examination  History: Patient is here for a physical examination. He has a rash that bothers him quite a lot and he would like attention to that. His gout has given him a lot of trouble and he would like to about that. He needs a screening colonoscopy scheduled. She keeps working but he doesn't have as much energy as he used to.  Past medical history: Regular medications: None Allergies: None Past medical history: History of sinusitis is off and on. History of arthritis.  Social history: None  Family history: Parents are both deceased. Father had a brain tumor. Mother had a stroke.  Social history: Patient is single, has a girlfriend. 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 He does not smoke drink or use drugs. He is sexually involved and always uses condoms.  Review of systems: Constitutional: Unremarkable HEENT: He has sinus pressure and congestion a lot. He also has a nosebleed  he relates to that. Eyes: Unremarkable. Wears reading glasses sometimes. Has not seen an eye doctor for a long time. Respiratory: Unremarkable Cardiovascular: Unremarkable Gastrointestinal: Unremarkable Endocrine: Unremarkable Genitourinary: Unremarkable Muscle skeletal: Hurts him a lot his joints and his back his muscles even a. He stiff when he gets out of bed he can hardly move. He has periodic flares of his gout  dermatologic: He has been having a rash that we talked about last year. It's a special in his arms but also gets it bad around his neck and terribly on his legs and ankles. Cream has not seemed to help him much. Neurologic: Unremarkable. Has trouble with his sleep. Hematologic: Unremarkable Psychiatric: Unremarkable   Physical examination: No acute distress. His TMs are normal. Throat clear. Eyes PERRLA. Fundi benign. Neck supple without nodes thyromegaly. No carotid bruits. Chest is clear to auscultation. Heart regular without murmurs gallops or  arrhythmias. Abdomen soft without mass or tenderness. Normal male external genitalia with testes it. No hernias. Digital rectal exam his prostate gland to be normal in shape and contour. I could only reach the lower half of the gland. He has a question 4 to his right ankle. He says he just has been the gout but gradually got worse. No specific injury. His joints are a little stiff. He has mild onychomycosis. Pulses are present in his feet. The rash is diffuse on his arms and legs looks like a chronic eczematoid rash that has been badly excoriated places. He has a little bit on his chest and neck.  Assessment: Physical examination Eczema Gout and gouty arthritis Right ankle arthritis History of mildly abnormal EKG, will repeat from 2 years ago Sinusitis Sleep disturbance  Plan: EKG labs and x-ray of ankle  EKG. Normal  [facility] reading (PRIMARY) by  Dr. Alwyn Ren No acute changes. Early soft tissue calcification. Mild degenerative changes.  Results for orders placed in visit on 12/11/13  POCT CBC      Result Value Ref Range   WBC 5.2  4.6 - 10.2 K/uL   Lymph, poc 1.7  0.6 - 3.4   POC LYMPH PERCENT 33.1  10 - 50 %L   MID (cbc) 0.5  0 - 0.9   POC MID % 10.4  0 - 12 %M   POC Granulocyte 2.9  2 - 6.9   Granulocyte percent 56.5  37 - 80 %G   RBC 4.46 (*) 4.69 - 6.13 M/uL   Hemoglobin 14.2  14.1 - 18.1 g/dL   HCT, POC 09.8  11.9 -  53.7 %   MCV 98.1 (*) 80 - 97 fL   MCH, POC 31.8 (*) 27 - 31.2 pg   MCHC 32.4  31.8 - 35.4 g/dL   RDW, POC 16.114.7     Platelet Count, POC 306  142 - 424 K/uL   MPV 8.0  0 - 99.8 fL  POCT URINALYSIS DIPSTICK      Result Value Ref Range   Color, UA yellow     Clarity, UA clear     Glucose, UA neg     Bilirubin, UA neg     Ketones, UA neg     Spec Grav, UA 1.025     Blood, UA neg     pH, UA 6.5     Protein, UA neg     Urobilinogen, UA 1.0     Nitrite, UA neg     Leukocytes, UA Negative    IFOBT (OCCULT BLOOD)      Result Value Ref Range   IFOBT Positive     POCT GLYCOSYLATED HEMOGLOBIN (HGB A1C)      Result Value Ref Range   Hemoglobin A1C 5.7

## 2013-12-12 LAB — PSA: PSA: 0.84 ng/mL (ref <=4.00)

## 2013-12-12 LAB — TSH: TSH: 1.409 u[IU]/mL (ref 0.350–4.500)

## 2014-01-14 ENCOUNTER — Encounter: Payer: Self-pay | Admitting: Family Medicine

## 2014-02-09 ENCOUNTER — Other Ambulatory Visit: Payer: Self-pay | Admitting: Family Medicine

## 2014-02-10 ENCOUNTER — Other Ambulatory Visit: Payer: Self-pay | Admitting: Family Medicine

## 2014-03-10 ENCOUNTER — Telehealth: Payer: Self-pay | Admitting: Family Medicine

## 2014-03-10 NOTE — Telephone Encounter (Signed)
Patient dropped off his FMLA ppw on 6/4. Ppw placed in Dr [facility] box on 6/10. Patient has not paid for these how he states he will pay when he comes in for his appt tomorrow. Will contact patient when ppw is completed.

## 2014-03-12 ENCOUNTER — Ambulatory Visit (INDEPENDENT_AMBULATORY_CARE_PROVIDER_SITE_OTHER): Payer: BC Managed Care – PPO | Admitting: Family Medicine

## 2014-03-12 ENCOUNTER — Encounter: Payer: Self-pay | Admitting: Family Medicine

## 2014-03-12 VITALS — BP 121/84 | HR 98 | Temp 97.9°F | Resp 20 | Ht 70.0 in | Wt 206.0 lb

## 2014-03-12 DIAGNOSIS — M25579 Pain in unspecified ankle and joints of unspecified foot: Secondary | ICD-10-CM

## 2014-03-12 DIAGNOSIS — M109 Gout, unspecified: Secondary | ICD-10-CM

## 2014-03-12 DIAGNOSIS — M1990 Unspecified osteoarthritis, unspecified site: Secondary | ICD-10-CM

## 2014-03-12 MED ORDER — DICLOFENAC SODIUM 75 MG PO TBEC: 75.0000 mg | DELAYED_RELEASE_TABLET | Freq: Two times a day (BID) | ORAL | Status: DC

## 2014-03-12 MED ORDER — TRAMADOL HCL 50 MG PO TABS: 50.0000 mg | ORAL_TABLET | Freq: Three times a day (TID) | ORAL | Status: DC | PRN

## 2014-03-12 NOTE — Progress Notes (Signed)
Subjective:    Patient ID: Alexander [name], male    DOB: June 04, 1961, 53 y.o.   MRN: [id number] This chart was scribed for Alexander MochaEva N Shaw, MD by Valera CastleSteven Cuppett, ED Scribe. This patient was seen in room 28 and the patient's care was started at 11:55 AM.  Chief Complaint  Patient presents with   Medication Refill   Gout   Toe Pain    R great toe Throbs @ night AWFUL   HPI Alexander [name] is a 53 y.o. male Pt has been struggling with gout like symptoms, however his uric acid levels have always been in the high normal range. At one point his uric acid was 8.6, so he was started on Alopurinol 100 mg daily and recommended to take Meloxicam and Colchicine regularly. Pt does not recall taking these medications in particular or if they helped or not.  Pt was seen 3 months prior for full psychical with a positive Hemosure est and so was referred to Gastroenterology. He reports that GI dr reassured him that it was just secondary to exam or hard stool and no further f/u was needed. At that exam pt was also started on Diclofenac for gout like symptoms. He reports that this helped a lot and he would like a refill on his diclofenac.  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 He reports constant, throbbing, bilateral great toe pain, much worse on the right, and usually worse in the evenings. He reports excruciating pain in his right toe occasionally. He also reports worsening, bilateral knee pain and right ankle pain, with associated stiffness. He reports LE stiffness after sitting for prolonged periods of time. He states he has been taking Advil with his Diclofenac 75 mg 2xday. He states he ran out of his Diclofenac last night. He reports it has been taking a long time for his it to kick in after taking it. He denies h/o taking Tramadol.   He denies any other symptoms. Pt requests to have blood work done at next visit.   Patient Active Problem List   Diagnosis Date Noted   Dermatitis 11/02/2012   Joint  pain, foot 11/02/2012   Elevated blood pressure 11/02/2012   Diverticulitis of colon with perforation 09/21/2011   Gout 09/21/2011   GERD (gastroesophageal reflux disease) 09/21/2011   BMI (body mass index) 20.0-29.9 09/21/2011   Past Medical History  Diagnosis Date   Gout    History of gastroesophageal reflux (GERD)    Overweight (BMI 25.0-29.9)    Gout 2007   Diverticulitis 2012   Allergy    Arthritis    Past Surgical History  Procedure Laterality Date   None     Hemorrhoid     No Known Allergies Prior to Admission medications   Medication Sig Start Date End Date Taking? Authorizing Provider  diclofenac (VOLTAREN) 75 MG EC tablet Take 1 tablet (75 mg total) by mouth 2 (two) times daily. Needs office visit. 02/09/14  Yes Ryan M Dunn, PA-C  hydrOXYzine (VISTARIL) 25 MG capsule Take one after work, and 2 at bedtime for itching. May cause drowsiness in the daytime 12/11/13  Yes Peyton Najjaravid H Hopper, MD  colchicine 0.6 MG tablet Take 1 tablet (0.6 mg total) by mouth 2 (two) times daily. 03/20/13 03/20/14  Peyton Najjaravid H Hopper, MD  HYDROcodone-acetaminophen (NORCO/VICODIN) 5-325 MG per tablet Take 1 tablet by mouth every 4 (four) hours as needed for moderate pain. 12/01/13   Arie Sabinaatherine E Schinlever, PA-C  indomethacin (INDOCIN) 50 MG  capsule Take 50 mg by mouth 2 (two) times daily with a meal.    Historical Provider, MD  ketoconazole (NIZORAL) 2 % cream Apply 1 application topically daily. 12/01/13   Arie Sabinaatherine E Schinlever, PA-C  Triamcinolone Acetonide 0.025 % LOTN Use a small amount of lotion the areas of itching twice daily 12/11/13   Peyton Najjaravid H Hopper, MD   Review of Systems  Constitutional: Negative for fever, chills and diaphoresis.  Cardiovascular: Negative for leg swelling.  Gastrointestinal: Negative for abdominal pain.  Endocrine: Negative for polydipsia, polyphagia and polyuria.  Genitourinary: Negative for hematuria and decreased urine volume.  Musculoskeletal: Positive for  arthralgias (bilateral knees, right ankle, bilateral great toes) and joint swelling. Negative for gait problem and myalgias.       + for LE stiffness  Skin: Negative for rash and wound.    BP 121/84   Pulse 98   Temp(Src) 97.9 F (36.6 C) (Oral)   Resp 20   Ht 5\' 10"  (1.778 m)   Wt 206 lb (93.441 kg)   BMI 29.56 kg/m2   SpO2 98%    Objective:   Physical Exam  Nursing note and vitals reviewed. Constitutional: He is oriented to person, place, and time. He appears well-developed and well-nourished. No distress.  HENT:  Head: Normocephalic and atraumatic.  Eyes: Conjunctivae and EOM are normal.  Neck: Normal range of motion.  Cardiovascular: Normal rate.   Pulmonary/Chest: Effort normal. No respiratory distress.  Musculoskeletal: Normal range of motion.  Neurological: He is alert and oriented to person, place, and time.  Skin: Skin is warm and dry.  Psychiatric: He has a normal mood and affect. His behavior is normal.      Assessment & Plan:   Gout - recommend pt have repeat bmp due to chronic nsaid use - he declines labs today but agrees to do at f/u  Joint pain, foot - pt has a variety of OA complaints in foot and knees - did respond well to cortisone inj in knees though it did increase his cbgs - ok to stay on voltaren for now w/ prn tramadol -will need OV w/ me for refills on tramadol as it is a controlled substance - pt warned of tolerance and agrees to use sparingly. May benefit from either allopurinol and/or colchicine in addition in future again - pt does not remember if pain or flairs decreased on these but for now seems like gout and OA are both controlled on nsaid. Reminded not use with any other otc pain medication other than tylenol/acetaminophen - so no aleve, ibuprofen, motrin, advil, etc as long as he is on the diclofenac and pt will stop the additional advil.   Osteoarthritis  Meds ordered this encounter  Medications   diclofenac (VOLTAREN) 75 MG EC tablet    Sig:  Take 1 tablet (75 mg total) by mouth 2 (two) times daily.    Dispense:  60 tablet    Refill:  5   traMADol (ULTRAM) 50 MG tablet    Sig: Take 1 tablet (50 mg total) by mouth every 8 (eight) hours as needed.    Dispense:  30 tablet    Refill:  5    I personally performed the services described in this documentation, which was scribed in my presence. The recorded information has been reviewed and considered, and addended by me as needed.  Norberto SorensonEva Shaw, MD MPH

## 2014-03-12 NOTE — Patient Instructions (Signed)
Osteoarthritis Osteoarthritis is a disease that causes soreness and swelling (inflammation) of a joint. It occurs when the cartilage at the affected joint wears down. Cartilage acts as a cushion, covering the ends of bones where they meet to form a joint. Osteoarthritis is the most common form of arthritis. It often occurs in older people. The joints affected most often by this condition include those in the:  Ends of the fingers.  Thumbs.  Neck.  Lower back.  Knees.  Hips. CAUSES  Over time, the cartilage that covers the ends of bones begins to wear away. This causes bone to rub on bone, producing pain and stiffness in the affected joints.  RISK FACTORS Certain factors can increase your chances of having osteoarthritis, including:  Older age.  Excessive body weight.  Overuse of joints. SIGNS AND SYMPTOMS   Pain, swelling, and stiffness in the joint.  Over time, the joint may lose its normal shape.  Small deposits of bone (osteophytes) may grow on the edges of the joint.  Bits of bone or cartilage can break off and float inside the joint space. This may cause more pain and damage. DIAGNOSIS  Your health care provider will do a physical exam and ask about your symptoms. Various tests may be ordered, such as:  X-rays of the affected joint.  An MRI scan.  Blood tests to rule out other types of arthritis.  Joint fluid tests. This involves using a needle to draw fluid from the joint and examining the fluid under a microscope. TREATMENT  Goals of treatment are to control pain and improve joint function. Treatment plans may include:  A prescribed exercise program that allows for rest and joint relief.  A weight control plan.  Pain relief techniques, such as:  Properly applied heat and cold.  Electric pulses delivered to nerve endings under the skin (transcutaneous electrical nerve stimulation, TENS).  Massage.  Certain nutritional supplements.  Medicines to  control pain, such as:  Acetaminophen.  Nonsteroidal anti-inflammatory drugs (NSAIDs), such as naproxen.  Narcotic or central-acting agents, such as tramadol.  Corticosteroids. These can be given orally or as an injection.  Surgery to reposition the bones and relieve pain (osteotomy) or to remove loose pieces of bone and cartilage. Joint replacement may be needed in advanced states of osteoarthritis. HOME CARE INSTRUCTIONS   Only take over-the-counter or prescription medicines as directed by your health care provider. Take all medicines exactly as instructed.  Maintain a healthy weight. Follow your health care provider's instructions for weight control. This may include dietary instructions.  Exercise as directed. Your health care provider can recommend specific types of exercise. These may include:  Strengthening exercises These are done to strengthen the muscles that support joints affected by arthritis. They can be performed with weights or with exercise bands to add resistance.  Aerobic activities These are exercises, such as brisk walking or low-impact aerobics, that get your heart pumping.  Range-of-motion activities These keep your joints limber.  Balance and agility exercises These help you maintain daily living skills.  Rest your affected joints as directed by your health care provider.  Follow up with your health care provider as directed. SEEK MEDICAL CARE IF:   Your skin turns red.  You develop a rash in addition to your joint pain.  You have worsening joint pain. SEEK IMMEDIATE MEDICAL CARE IF:  You have a significant loss of weight or appetite.  You have a fever along with joint or muscle aches.  You have   night sweats. FOR MORE INFORMATION  National Institute of Arthritis and Musculoskeletal and Skin Diseases: www.niams.http://www.myers.net/nih.gov [employer] Aging: https://walker.com/www.nia.nih.gov American College of Rheumatology: www.rheumatology.org Document Released: 09/17/2005  Document Revised: 07/08/2013 Document Reviewed: 05/25/2013 [facility] Patient Information 2014 [place], [place]. Wear and Tear Disorders of the Knee (Arthritis, Osteoarthritis) Everyone will experience wear and tear injuries (arthritis, osteoarthritis) of the knee. These are the changes we all get as we age. They come from the joint stress of daily living. The amount of cartilage damage in your knee and your symptoms determine if you need surgery. Mild problems require approximately two months recovery time. More severe problems take several months to recover. With mild problems, your surgeon may find worn and rough cartilage surfaces. With severe changes, your surgeon may find cartilage that has completely worn away and exposed the bone. Loose bodies of bone and cartilage, bone spurs (excess bone growth), and injuries to the menisci (cushions between the large bones of your leg) are also common. All of these problems can cause pain. For a mild wear and tear problem, rough cartilage may simply need to be shaved and smoothed. For more severe problems with areas of exposed bone, your surgeon may use an instrument for roughing up the bone surfaces to stimulate new cartilage growth. Loose bodies are usually removed. Torn menisci may be trimmed or repaired. ABOUT THE ARTHROSCOPIC PROCEDURE Arthroscopy is a surgical technique. It allows your orthopedic surgeon to diagnose and treat your knee injury with accuracy. The surgeon looks into your knee through a small scope. The scope is like a small (pencil-sized) telescope. Arthroscopy is less invasive than open knee surgery. You can expect a more rapid recovery. After the procedure, you will be moved to a recovery area until most of the effects of the medication have worn off. Your caregiver will discuss the test results with you. RECOVERY The severity of the arthritis and the type of procedure performed will determine recovery time. Other important factors include age,  physical condition, medical conditions, and the type of rehabilitation program. Strengthening your muscles after arthroscopy helps guarantee a better recovery. Follow your caregiver's instructions. Use crutches, rest, elevate, ice, and do knee exercises as instructed. Your caregivers will help you and instruct you with exercises and other physical therapy required to regain your mobility, muscle strength, and functioning following surgery. Only take over-the-counter or prescription medicines for pain, discomfort, or fever as directed by your caregiver.  SEEK MEDICAL CARE IF:   There is increased bleeding (more than a small spot) from the wound.  You notice redness, swelling, or increasing pain in the wound.  Pus is coming from wound.  You develop an unexplained oral temperature above 102 F (38.9 C) , or as your caregiver suggests.  You notice a foul smell coming from the wound or dressing.  You have severe pain with motion of the knee. SEEK IMMEDIATE MEDICAL CARE IF:   You develop a rash.  You have difficulty breathing.  You have any allergic problems. MAKE SURE YOU:   Understand these instructions.  Will watch your condition.  Will get help right away if you are not doing well or get worse. Document Released: 09/14/2000 Document Revised: 12/10/2011 Document Reviewed: 02/11/2008 [facility] Patient Information 2014 [place], [place]. Information for patients with Gout  Gout defined-Gout occurs when urate crystals accumulate in your joint causing the inflammation and intense pain of gout attack.  Urate crystals can form when you have high levels of uric acid in your blood.  Your body produces uric acid when it breaks down prurines-substances that are found naturally in your body, as well as in certain foods such as organ meats, anchioves, herring, asparagus, and mushrooms.  Normally uric acid dissolves in your blood and passes through your kidneys into your urine.  But sometimes your  body either produces too much uric acid or your kidneys excrete too little uric acid.  When this happens, uric acid can build up, forming sharp needle-like urate crystals in a joint or surrounding tissue that cause pain, inflammation and swelling.    Gout is characterized by sudden, severe attacks of pain, redness and tenderness in joints, often the joint at the base of the big toe.  Gout is complex form of arthritis that can affect anyone.  Men are more likely to get gout but women become increasingly more susceptible to gout after menopause.  An acute attack of gout can wake you up in the middle of the night with the sensation that your big toe is on fire.  The affected joint is hot, swollen and so tender that even the weight or the sheet on it may seem intolerable.  If you experience symptoms of an acute gout attack it is important to your doctor as soon as the symptoms start.  Gout that goes untreated can lead to worsening pain and joint damage.  Risk Factors:  You are more likely to develop gout if you have high levels of uric acid in your body.    Factors that increase the uric acid level in your body include:  Lifestyle factors.  Excessive alcohol use-generally more than two drinks a day for men and more than one for women increase the risk of gout.  Medical conditions.  Certain conditions make it more likely that you will develop gout.  These include hypertension, and chronic conditions such as diabetes, high levels of fat and cholesterol in the blood, and narrowing of the arteries.  Certain medications.  The uses of Thiazide diuretics- commonly used to treat hypertension and low dose aspirin can also increase uric acid levels.  Family history of gout.  If other members of your family have had gout, you are more likely to develop the disease.  Age and sex. Gout occurs more often in men than it does in women, primarily because women tend to have lower uric acid levels than men do.  Men  are more likely to develop gout earlier usually between the ages of 9040-50- whereas women generally develop signs and symptoms after menopause.    Tests and diagnosis:  Tests to help diagnose gout may include:  Blood test.  Your doctor may recommend a blood test to measure the uric acid level in your blood .  Blood tests can be misleading, though.  Some people have high uric acid levels but never experience gout.  And some people have signs and symptoms of gout, but don't have unusual levels of uric acid in their blood.  Joint fluid test.  Your doctor may use a needle to draw fluid from your affected joint.  When examined under the microscope, your joint fluid may reveal urate crystals.  Treatment:  Treatment for gout usually involves medications.  What medications you and your doctor choose will be based on your current health and other medications you currently take.  Gout medications can be used to treat acute gout attacks and prevent future attacks as well as reduce your risk of complications from gout such as the development of  tophi from urate crystal deposits.  Alternative medicine:   Certain foods have been studied for their potential to lower uric acid levels, including:  Coffee.  Studies have found an association between coffee drinking (regular and decaf) and lower uric acid levels.  The evidence is not enough to encourage non-coffee drinkers to start, but it may give clues to new ways of treating gout in the future.  Vitamin C.  Supplements containing vitamin C may reduce the levels of uric acid in your blood.  However, vitamin as a treatment for gout. Don't assume that if a little vitamin C is good, than lots is better.  Megadoses of vitamin C may increase your bodies uric acid levels.  Cherries.  Cherries have been associated with lower levels of uric acid in studies, but it isn't clear if they have any effect on gout signs and symptoms.  Eating more cherries and other  dar-colored fruits, such as blackberries, blueberries, purple grapes and raspberries, may be a safe way to support your gout treatment.    Lifestyle/Diet Recommendations:   Drink 8 to 16 cups ( about 2 to 4 liters) of fluid each day, with at least half being water.  Avoid alcohol  Eat a moderate amount of protein, preferably from healthy sources, such as low-fat or fat-free dairy, tofu, eggs, and nut butters.  Limit you daily intake of meat, fish, and poultry to 4 to 6 ounces.  Avoid high fat meats and desserts.  Decrease you intake of shellfish, beef, lamb, pork, eggs and cheese.  Choose a good source of vitamin C daily such as citrus fruits, strawberries, broccoli,  brussel sprouts, papaya, and cantaloupe.   Choose a good source of vitamin A every other day such as yellow fruits, or dark green/yellow vegetables.  Avoid drastic weigh reduction or fasting.  If weigh loss is desired lose it over a period of several months.  See "dietary considerations.." chart for specific food recommendations.  Dietary Considerations for people with Gout  Food with negligible purine content (0-15 mg of purine nitrogen per 100 grams food)  May use as desired except on calorie variations  Non fat milk Cocoa Cereals (except in list II) Hard candies  Buttermilk Carbonated drinks Vegetables (except in list II) Sherbet  Coffee Fruits Sugar Honey  Tea Cottage Cheese Gelatin-jell-o Salt  Fruit juice Breads Angel food Cake   Herbs/spices Jams/Jellies [employer]    Foods that do not contain excessive purine content, but must be limited due to fat content  Cream Eggs Oil and Salad Dressing  Half and Half Peanut Butter Chocolate  Whole Milk Cakes Potato Chips  Butter Ice Cream Fried Foods  Cheese Nuts Waffles, pancakes   List II: Food with moderate purine content (50-150 mg of purine nitrogen per 100 grams of food)  Limit total amount each day to 5 oz. cooked Lean meat, other than  those on list III   Poultry, other than those on list III Fish, other than those on list III   Seafood, other than those on list III  These foods may be used occasionally  Peas Lentils Bran  Spinach Oatmeal Dried Beans and Peas  Asparagus Wheat Germ Mushrooms   Additional information about meat choices  Choose fish and poultry, particularly without skin, often.  Select lean, well trimmed cuts of meat.  Avoid all fatty meats, bacon , sausage, fried meats, fried fish, or poultry, luncheon meats, cold cuts, hot dogs, meats canned or frozen in gravy, spareribs  and frozen and packaged prepared meats.   List III: Foods with HIGH purine content / Foods to AVOID (150-800 mg of purine nitrogen per 100 grams of food)  Anchovies Herring Meat Broths  Liver Mackerel Meat Extracts  Kidney Scallops Meat Drippings  Sardines Wild Game Mincemeat  Sweetbreads Goose Gravy  Heart Tongue Yeast, baker's and brewers   Commercial soups made with any of the foods listed in List II or List III  In addition avoid all alcoholic beverages

## 2014-03-22 ENCOUNTER — Telehealth: Payer: Self-pay | Admitting: Family Medicine

## 2014-03-22 NOTE — Telephone Encounter (Signed)
Patient recently had FMLA ppw completed for gout flare ups however he states that the paperwork was incorrectly filled out. States that in the additional notes section of his paperwork a statement should be written that patient may need to miss a day or two of work or have a need to come in for an office visit more than 2-3 times a year. 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 Can I fill this in Dr. Alwyn RenHopper and have you initial and date in this section so that I can fax this back to his job?    [phone]

## 2014-03-25 NOTE — Telephone Encounter (Signed)
error 

## 2014-09-17 ENCOUNTER — Ambulatory Visit (INDEPENDENT_AMBULATORY_CARE_PROVIDER_SITE_OTHER): Payer: BC Managed Care – PPO | Admitting: Family Medicine

## 2014-09-17 ENCOUNTER — Encounter: Payer: Self-pay | Admitting: Family Medicine

## 2014-09-17 VITALS — BP 140/96 | HR 87 | Temp 98.7°F | Resp 16 | Ht 68.5 in | Wt 202.0 lb

## 2014-09-17 DIAGNOSIS — R195 Other fecal abnormalities: Secondary | ICD-10-CM

## 2014-09-17 DIAGNOSIS — IMO0001 Reserved for inherently not codable concepts without codable children: Secondary | ICD-10-CM

## 2014-09-17 DIAGNOSIS — R7303 Prediabetes: Secondary | ICD-10-CM

## 2014-09-17 DIAGNOSIS — M1A9XX0 Chronic gout, unspecified, without tophus (tophi): Secondary | ICD-10-CM

## 2014-09-17 DIAGNOSIS — R030 Elevated blood-pressure reading, without diagnosis of hypertension: Secondary | ICD-10-CM

## 2014-09-17 DIAGNOSIS — R21 Rash and other nonspecific skin eruption: Secondary | ICD-10-CM

## 2014-09-17 LAB — CBC WITH DIFFERENTIAL/PLATELET
Basophils Absolute: 0 10*3/uL (ref 0.0–0.1)
Basophils Relative: 1 % (ref 0–1)
Eosinophils Absolute: 0.1 10*3/uL (ref 0.0–0.7)
Eosinophils Relative: 3 % (ref 0–5)
HCT: 40.7 % (ref 39.0–52.0)
Hemoglobin: 14 g/dL (ref 13.0–17.0)
Lymphocytes Relative: 30 % (ref 12–46)
Lymphs Abs: 1.5 10*3/uL (ref 0.7–4.0)
MCH: 32.9 pg (ref 26.0–34.0)
MCHC: 34.4 g/dL (ref 30.0–36.0)
MCV: 95.5 fL (ref 78.0–100.0)
MPV: 9.2 fL — ABNORMAL LOW (ref 9.4–12.4)
Monocytes Absolute: 0.6 10*3/uL (ref 0.1–1.0)
Monocytes Relative: 13 % — ABNORMAL HIGH (ref 3–12)
Neutro Abs: 2.6 10*3/uL (ref 1.7–7.7)
Neutrophils Relative %: 53 % (ref 43–77)
Platelets: 236 10*3/uL (ref 150–400)
RBC: 4.26 MIL/uL (ref 4.22–5.81)
RDW: 14.2 % (ref 11.5–15.5)
WBC: 4.9 10*3/uL (ref 4.0–10.5)

## 2014-09-17 MED ORDER — DICLOFENAC SODIUM 75 MG PO TBEC: 75.0000 mg | DELAYED_RELEASE_TABLET | Freq: Two times a day (BID) | ORAL | Status: DC

## 2014-09-17 MED ORDER — AMMONIUM LACTATE 10 % EX LOTN: 1.0000 "application " | TOPICAL_LOTION | Freq: Two times a day (BID) | CUTANEOUS | Status: DC | PRN

## 2014-09-17 MED ORDER — TRAMADOL HCL 50 MG PO TABS: 50.0000 mg | ORAL_TABLET | Freq: Three times a day (TID) | ORAL | Status: DC | PRN

## 2014-09-17 NOTE — Progress Notes (Signed)
Subjective:    Patient ID: Alexander [name], male    DOB: [birth year], 53 y.o.   MRN: [id number] This chart was scribed for Norberto SorensonEva Shaw, MD by Jolene Provostobert Halas, Medical Scribe. This patient was seen in Room 24 and the patient's care was started a 7:41 PM.  Chief Complaint  Patient presents with  . Follow-up    GOUT    HPI  Past Medical History  Diagnosis Date  . Gout   . History of gastroesophageal reflux (GERD)   . Overweight (BMI 25.0-29.9)   . Gout 2007  . Diverticulitis 2012  . Allergy   . Arthritis    No Known Allergies Current Outpatient Prescriptions on File Prior to Visit  Medication Sig Dispense Refill  . hydrOXYzine (VISTARIL) 25 MG capsule Take one after work, and 2 at bedtime for itching. May cause drowsiness in the daytime (Patient not taking: Reported on 09/17/2014) 90 capsule 1   No current facility-administered medications on file prior to visit.   HPI Comments: Alexander [name] is a 53 y.o. male [facility] follow up appointment for a gout flare up. At last visit pt was diagnosed prediabetic with an A1C of  5.7. Positive fecal occult blood test at time and was referred to gastroenterology, but never made the appointment.   Pt is in a rush today and so unable to wait for a full evaluation as he has several other patients ahead of him waiting. However, he needs refills on his medications - diclofenac and tramadol. And wonders about the rash on his ankle which he attributes to swelling prior from gout attacks. Has been using some topical steroid but it is very itchy.  Also some lightheadness for position change but unable to wait for full eval - will RTC to discuss further if it continues.   Review of Systems  Musculoskeletal: Positive for joint swelling, arthralgias and gait problem.  Skin: Positive for color change and rash.  Neurological: Positive for dizziness and light-headedness.       Objective:  BP 140/96 mmHg  Pulse 87   Temp(Src) 98.7 F (37.1 C) (Oral)  Resp 16  Ht 5' 8.5" (1.74 m)  Wt 202 lb (91.627 kg)  BMI 30.26 kg/m2  SpO2 97%  Physical Exam  Constitutional: He is oriented to person, place, and time. He appears well-developed and well-nourished.  HENT:  Head: Normocephalic and atraumatic.  Eyes: Pupils are equal, round, and reactive to light.  Neck: Neck supple.  Cardiovascular: Normal rate and regular rhythm.   Pulmonary/Chest: Effort normal and breath sounds normal. No respiratory distress.  Musculoskeletal:       Right ankle: He exhibits normal range of motion, no swelling, no ecchymosis, no deformity and normal pulse. No tenderness.  Neurological: He is alert and oriented to person, place, and time.  Skin: Skin is warm and dry. Rash noted. Rash is macular.  Poorly defined hypopigmented macule approx 3-4 in dm on medial right malleolus with satellite lesions. Excoriations and hyperpigmentation on lateral aspect  Psychiatric: He has a normal mood and affect. His behavior is normal.  Nursing note and vitals reviewed.      Assessment & Plan:   Chronic gout without tophus, unspecified cause, unspecified site - Plan: Uric acid - meds refilled  Fecal occult blood test positive - at last cpe - pt never scheduled appt with [facility] - states he will call them to schedule  Prediabetes - Plan: BASIC METABOLIC PANEL WITH GFR  Elevated blood pressure - Plan: CBC with Differential, BASIC METABOLIC PANEL WITH GFR - cont to work on tlc  Rash and nonspecific skin eruption - on right medial ankle overlying site of prev gout flairs - d/c use of topical steroids due to excoriations and hypopigmentation. Advised use of topical benadryl or try ammonium lactate lotion - RTC for further eval if continues   Meds ordered this encounter  Medications  . diclofenac (VOLTAREN) 75 MG EC tablet    Sig: Take 1 tablet (75 mg total) by mouth 2 (two) times daily.    Dispense:  60 tablet    Refill:  5  . traMADol  (ULTRAM) 50 MG tablet    Sig: Take 1 tablet (50 mg total) by mouth every 8 (eight) hours as needed.    Dispense:  30 tablet    Refill:  5    I personally performed the services described in this documentation, which was scribed in my presence. The recorded information has been reviewed and considered, and addended by me as needed.  Norberto SorensonEva Shaw, MD MPH

## 2014-09-17 NOTE — Patient Instructions (Signed)
Information for patients with Gout  Gout defined-Gout occurs when urate crystals accumulate in your joint causing the inflammation and intense pain of gout attack.  Urate crystals can form when you have high levels of uric acid in your blood.  Your body produces uric acid when it breaks down prurines-substances that are found naturally in your body, as well as in certain foods such as organ meats, anchioves, herring, asparagus, and mushrooms.  Normally uric acid dissolves in your blood and passes through your kidneys into your urine.  But sometimes your body either produces too much uric acid or your kidneys excrete too little uric acid.  When this happens, uric acid can build up, forming sharp needle-like urate crystals in a joint or surrounding tissue that cause pain, inflammation and swelling.    Gout is characterized by sudden, severe attacks of pain, redness and tenderness in joints, often the joint at the base of the big toe.  Gout is complex form of arthritis that can affect anyone.  Men are more likely to get gout but women become increasingly more susceptible to gout after menopause.  An acute attack of gout can wake you up in the middle of the night with the sensation that your big toe is on fire.  The affected joint is hot, swollen and so tender that even the weight or the sheet on it may seem intolerable.  If you experience symptoms of an acute gout attack it is important to your doctor as soon as the symptoms start.  Gout that goes untreated can lead to worsening pain and joint damage.  Risk Factors:  You are more likely to develop gout if you have high levels of uric acid in your body.    Factors that increase the uric acid level in your body include:  Lifestyle factors.  Excessive alcohol use-generally more than two drinks a day for men and more than one for women increase the risk of gout.  Medical conditions.  Certain conditions make it more likely that you will develop gout.   These include hypertension, and chronic conditions such as diabetes, high levels of fat and cholesterol in the blood, and narrowing of the arteries.  Certain medications.  The uses of Thiazide diuretics- commonly used to treat hypertension and low dose aspirin can also increase uric acid levels.  Family history of gout.  If other members of your family have had gout, you are more likely to develop the disease.  Age and sex. Gout occurs more often in men than it does in women, primarily because women tend to have lower uric acid levels than men do.  Men are more likely to develop gout earlier usually between the ages of 40-50- whereas women generally develop signs and symptoms after menopause.    Tests and diagnosis:  Tests to help diagnose gout may include:  Blood test.  Your doctor may recommend a blood test to measure the uric acid level in your blood .  Blood tests can be misleading, though.  Some people have high uric acid levels but never experience gout.  And some people have signs and symptoms of gout, but don't have unusual levels of uric acid in their blood.  Joint fluid test.  Your doctor may use a needle to draw fluid from your affected joint.  When examined under the microscope, your joint fluid may reveal urate crystals.  Treatment:  Treatment for gout usually involves medications.  What medications you and your doctor choose will be   based on your current health and other medications you currently take.  Gout medications can be used to treat acute gout attacks and prevent future attacks as well as reduce your risk of complications from gout such as the development of tophi from urate crystal deposits.  Alternative medicine:   Certain foods have been studied for their potential to lower uric acid levels, including:  Coffee.  Studies have found an association between coffee drinking (regular and decaf) and lower uric acid levels.  The evidence is not enough to encourage  non-coffee drinkers to start, but it may give clues to new ways of treating gout in the future.  Vitamin C.  Supplements containing vitamin C may reduce the levels of uric acid in your blood.  However, vitamin as a treatment for gout. Don't assume that if a little vitamin C is good, than lots is better.  Megadoses of vitamin C may increase your bodies uric acid levels.  Cherries.  Cherries have been associated with lower levels of uric acid in studies, but it isn't clear if they have any effect on gout signs and symptoms.  Eating more cherries and other dar-colored fruits, such as blackberries, blueberries, purple grapes and raspberries, may be a safe way to support your gout treatment.    Lifestyle/Diet Recommendations:   Drink 8 to 16 cups ( about 2 to 4 liters) of fluid each day, with at least half being water.  Avoid alcohol  Eat a moderate amount of protein, preferably from healthy sources, such as low-fat or fat-free dairy, tofu, eggs, and nut butters.  Limit you daily intake of meat, fish, and poultry to 4 to 6 ounces.  Avoid high fat meats and desserts.  Decrease you intake of shellfish, beef, lamb, pork, eggs and cheese.  Choose a good source of vitamin C daily such as citrus fruits, strawberries, broccoli,  brussel sprouts, papaya, and cantaloupe.   Choose a good source of vitamin A every other day such as yellow fruits, or dark green/yellow vegetables.  Avoid drastic weigh reduction or fasting.  If weigh loss is desired lose it over a period of several months.  See "dietary considerations.." chart for specific food recommendations.  Dietary Considerations for people with Gout  Food with negligible purine content (0-15 mg of purine nitrogen per 100 grams food)  May use as desired except on calorie variations  Non fat milk Cocoa Cereals (except in list II) Hard candies  Buttermilk Carbonated drinks Vegetables (except in list II) Sherbet  Coffee Fruits Sugar Honey  Tea  Cottage Cheese Gelatin-jell-o Salt  Fruit juice Breads Angel food Cake   Herbs/spices Jams/Jellies Carnation Instant Breakfast    Foods that do not contain excessive purine content, but must be limited due to fat content  Cream Eggs Oil and Salad Dressing  Half and Half Peanut Butter Chocolate  Whole Milk Cakes Potato Chips  Butter Ice Cream Fried Foods  Cheese Nuts Waffles, pancakes   List II: Food with moderate purine content (50-150 mg of purine nitrogen per 100 grams of food)  Limit total amount each day to 5 oz. cooked Lean meat, other than those on list III   Poultry, other than those on list III Fish, other than those on list III   Seafood, other than those on list III  These foods may be used occasionally  Peas Lentils Bran  Spinach Oatmeal Dried Beans and Peas  Asparagus Wheat Germ Mushrooms   Additional information about meat choices  Choose fish   and poultry, particularly without skin, often.  Select lean, well trimmed cuts of meat.  Avoid all fatty meats, bacon , sausage, fried meats, fried fish, or poultry, luncheon meats, cold cuts, hot dogs, meats canned or frozen in gravy, spareribs and frozen and packaged prepared meats.   List III: Foods with HIGH purine content / Foods to AVOID (150-800 mg of purine nitrogen per 100 grams of food)  Anchovies Herring Meat Broths  Liver Mackerel Meat Extracts  Kidney Scallops Meat Drippings  Sardines Wild Game Mincemeat  Sweetbreads Goose Gravy  Heart Tongue Yeast, baker's and brewers   Commercial soups made with any of the foods listed in List II or List III  In addition avoid all alcoholic beverages  DASH Eating Plan DASH stands for "Dietary Approaches to Stop Hypertension." The DASH eating plan is a healthy eating plan that has been shown to reduce high blood pressure (hypertension). Additional health benefits may include reducing the risk of type 2 diabetes mellitus, heart disease, and stroke. The DASH eating plan may  also help with weight loss. WHAT DO I NEED TO KNOW ABOUT THE DASH EATING PLAN? For the DASH eating plan, you will follow these general guidelines:  Choose foods with a percent daily value for sodium of less than 5% (as listed on the food label).  Use salt-free seasonings or herbs instead of table salt or sea salt.  Check with your health care provider or pharmacist before using salt substitutes.  Eat lower-sodium products, often labeled as "lower sodium" or "no salt added."  Eat fresh foods.  Eat more vegetables, fruits, and low-fat dairy products.  Choose whole grains. Look for the word "whole" as the first word in the ingredient list.  Choose fish and skinless chicken or [place] more often than red meat. Limit fish, poultry, and meat to 6 oz (170 g) each day.  Limit sweets, desserts, sugars, and sugary drinks.  Choose heart-healthy fats.  Limit cheese to 1 oz (28 g) per day.  Eat more home-cooked food and less restaurant, buffet, and fast food.  Limit fried foods.  Cook foods using methods other than frying.  Limit canned vegetables. If you do use them, rinse them well to decrease the sodium.  When eating at a restaurant, ask that your food be prepared with less salt, or no salt if possible. WHAT FOODS CAN I EAT? Seek help from a dietitian for individual calorie needs. Grains Whole grain or whole wheat bread. Brown rice. Whole grain or whole wheat pasta. Quinoa, bulgur, and whole grain cereals. Low-sodium cereals. Corn or whole wheat flour tortillas. Whole grain cornbread. Whole grain crackers. Low-sodium crackers. Vegetables Fresh or frozen vegetables (raw, steamed, roasted, or grilled). Low-sodium or reduced-sodium tomato and vegetable juices. Low-sodium or reduced-sodium tomato sauce and paste. Low-sodium or reduced-sodium canned vegetables.  Fruits All fresh, canned (in natural juice), or frozen fruits. Meat and Other Protein Products Ground beef (85% or leaner),  grass-fed beef, or beef trimmed of fat. Skinless chicken or Mal[place]key. Ground chicken or [place]key. Pork trimmed of fat. All fish and seafood. Eggs. Dried beans, peas, or lentils. Unsalted nuts and seeds. Unsalted canned beans. Dairy Low-fat dairy products, such as skim or 1% milk, 2% or reduced-fat cheeses, low-fat ricotta or cottage cheese, or plain low-fat yogurt. Low-sodium or reduced-sodium cheeses. Fats and Oils Tub margarines without trans fats. Light or reduced-fat mayonnaise and salad dressings (reduced sodium). Avocado. Safflower, olive, or canola oils. Natural peanut or almond butter. Other Unsalted popcorn and pretzels.  The items listed above may not be a complete list of recommended foods or beverages. Contact your dietitian for more options. WHAT FOODS ARE NOT RECOMMENDED? Grains White bread. White pasta. White rice. Refined cornbread. Bagels and croissants. Crackers that contain trans fat. Vegetables Creamed or fried vegetables. Vegetables in a cheese sauce. Regular canned vegetables. Regular canned tomato sauce and paste. Regular tomato and vegetable juices. Fruits Dried fruits. Canned fruit in light or heavy syrup. Fruit juice. Meat and Other Protein Products Fatty cuts of meat. Ribs, chicken wings, bacon, sausage, bologna, salami, chitterlings, fatback, hot dogs, bratwurst, and packaged luncheon meats. Salted nuts and seeds. Canned beans with salt. Dairy Whole or 2% milk, cream, half-and-half, and cream cheese. Whole-fat or sweetened yogurt. Full-fat cheeses or blue cheese. Nondairy creamers and whipped toppings. Processed cheese, cheese spreads, or cheese curds. Condiments Onion and garlic salt, seasoned salt, table salt, and sea salt. Canned and packaged gravies. Worcestershire sauce. Tartar sauce. Barbecue sauce. Teriyaki sauce. Soy sauce, including reduced sodium. Steak sauce. Fish sauce. Oyster sauce. Cocktail sauce. Horseradish. Ketchup and mustard. Meat flavorings and  tenderizers. Bouillon cubes. Hot sauce. Tabasco sauce. Marinades. Taco seasonings. Relishes. Fats and Oils Butter, stick margarine, lard, shortening, ghee, and bacon fat. Coconut, palm kernel, or palm oils. Regular salad dressings. Other Pickles and olives. Salted popcorn and pretzels. The items listed above may not be a complete list of foods and beverages to avoid. Contact your dietitian for more information. WHERE CAN I FIND MORE INFORMATION? National Heart, Lung, and Blood Institute: CablePromo.itwww.nhlbi.nih.gov/health/health-topics/topics/dash/ Document Released: 09/06/2011 Document Revised: 02/01/2014 Document Reviewed: 07/22/2013 [facility] Patient Information 2015 [employer], [place]. This information is not intended to replace advice given to you by your health care provider. Make sure you discuss any questions you have with your health care provider.

## 2014-09-18 LAB — BASIC METABOLIC PANEL WITH GFR
BUN: 19 mg/dL (ref 6–23)
CO2: 27 mEq/L (ref 19–32)
Calcium: 9.3 mg/dL (ref 8.4–10.5)
Chloride: 104 mEq/L (ref 96–112)
Creat: 0.92 mg/dL (ref 0.50–1.35)
GFR, Est African American: >89 mL/min
GFR, Est Non African American: >89 mL/min
Glucose, Bld: 77 mg/dL (ref 70–99)
Potassium: 4 mEq/L (ref 3.5–5.3)
Sodium: 140 mEq/L (ref 135–145)

## 2014-09-18 LAB — URIC ACID: Uric Acid, Serum: 7.8 mg/dL (ref 4.0–7.8)

## 2014-09-27 ENCOUNTER — Encounter: Payer: Self-pay | Admitting: Family Medicine

## 2014-12-27 IMAGING — CR DG ANKLE COMPLETE 3+V*R*
3 series · 3 of 3 positions shown · non-contrast
Comparison: None.

CLINICAL DATA: Pain

RIGHT ANKLE - COMPLETE 3+ VIEW

[AP]
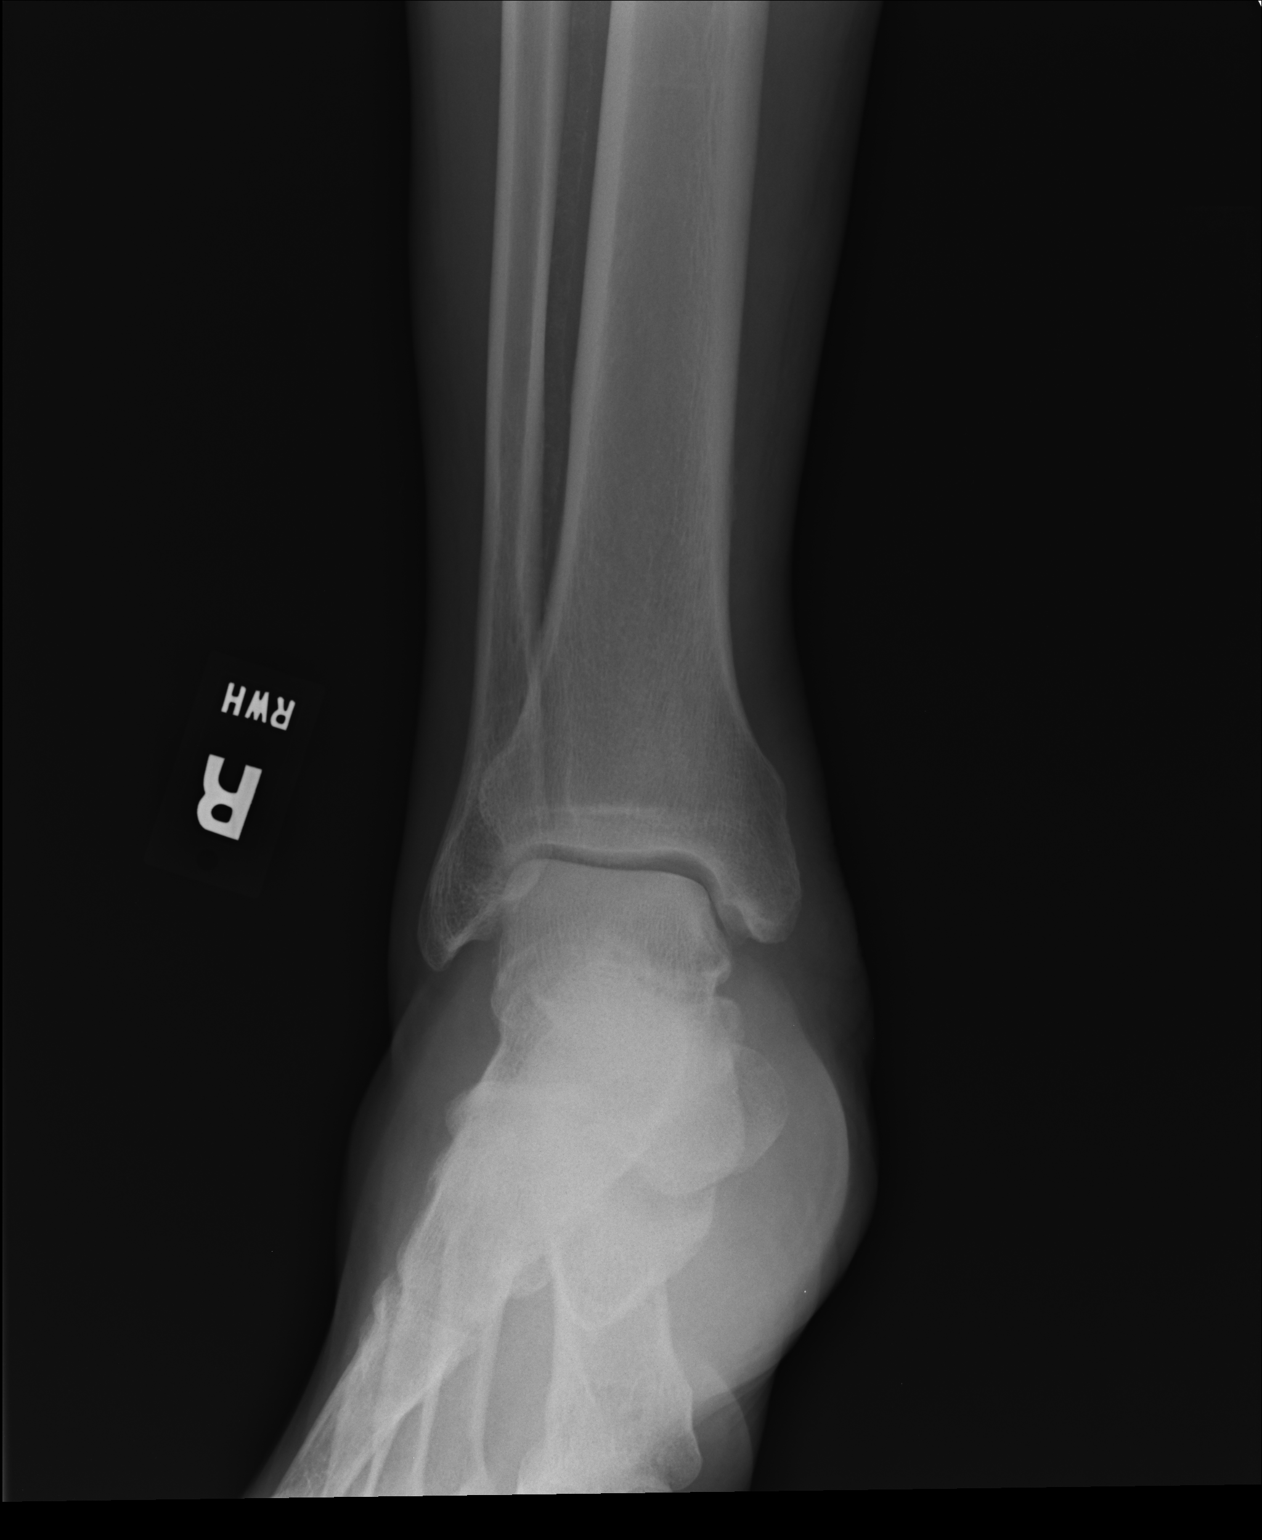

[lateral]
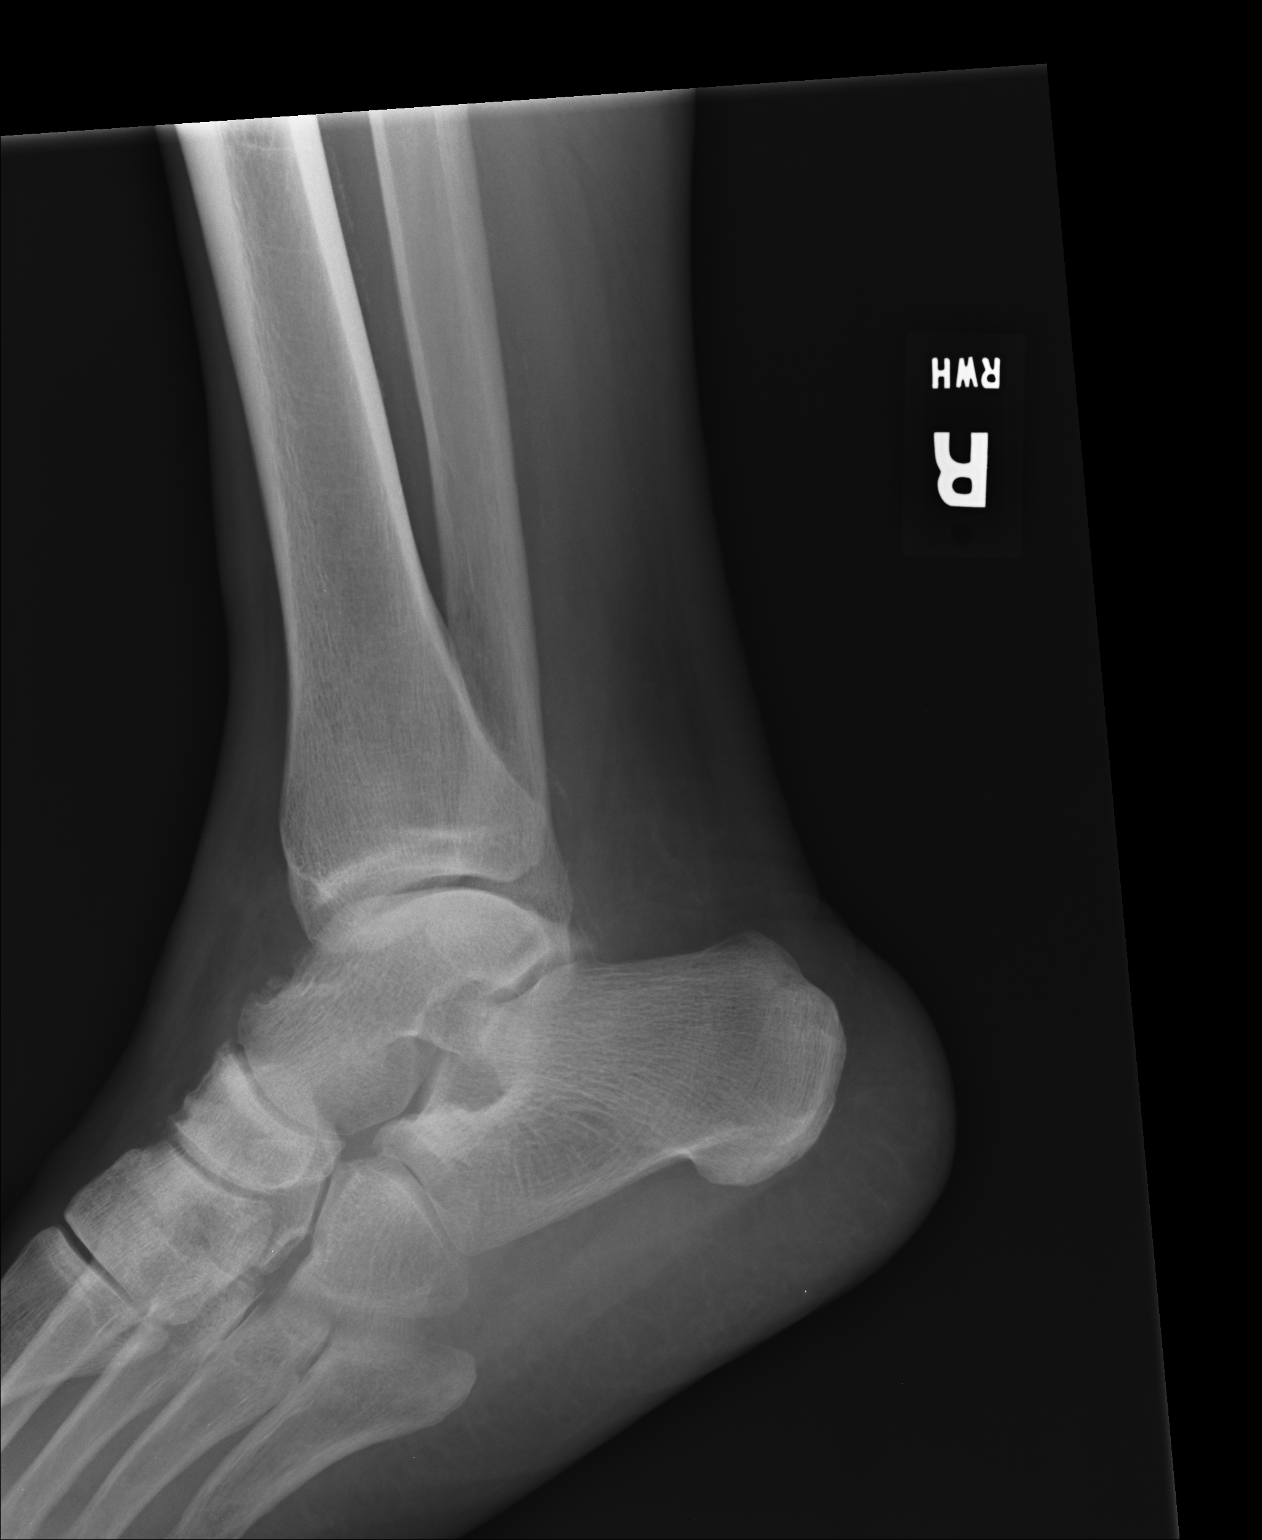

[oblique]
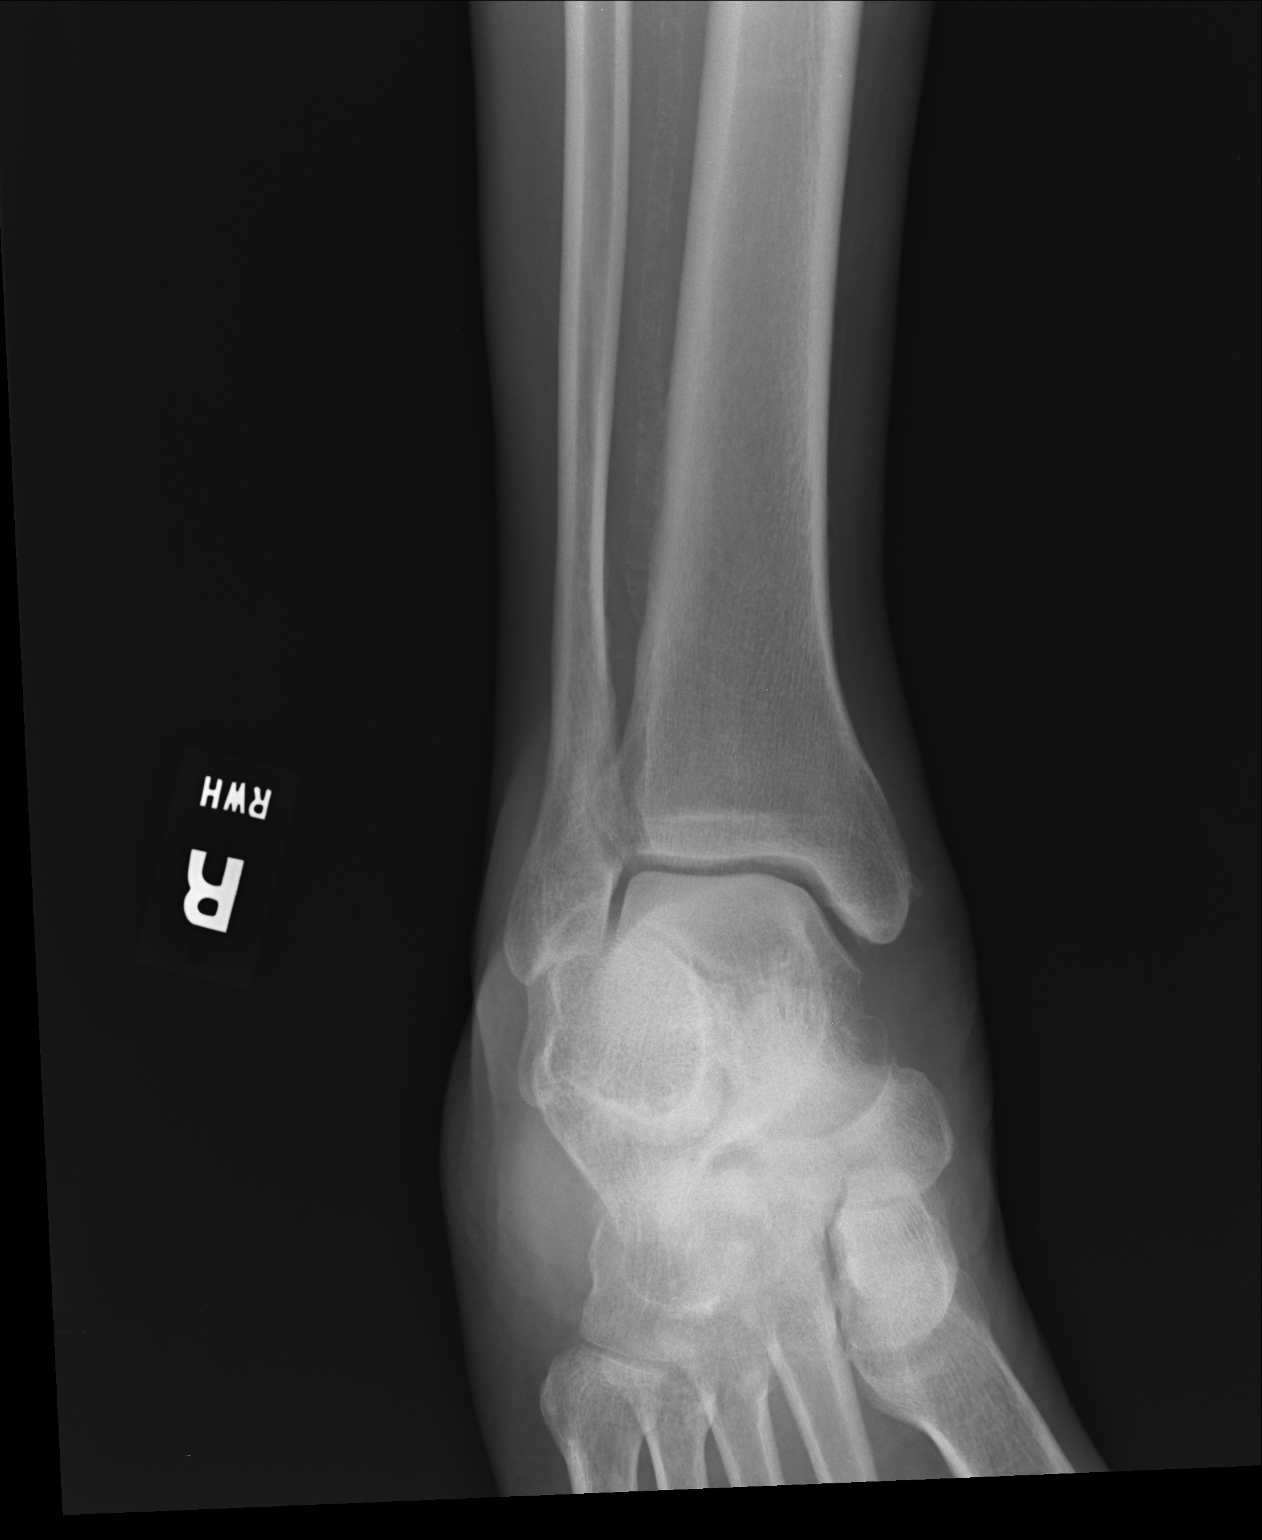

[3 of 3 positions shown; findings below may reference images not displayed]

FINDINGS: Frontal, oblique, and lateral views were obtained. There
is mild diffuse swelling.  No fracture or effusion.  Ankle mortise
appears intact.  There is mild narrowing medially.
IMPRESSION: Soft tissue swelling with mild narrowing medially.  No fracture.
Mortise intact.

## 2014-12-27 IMAGING — CR DG KNEE COMPLETE 4+V*R*
3 series · 3 of 3 positions shown · non-contrast
Comparison: None.

CLINICAL DATA: Pain

RIGHT KNEE - COMPLETE 4+ VIEW

[lateral]
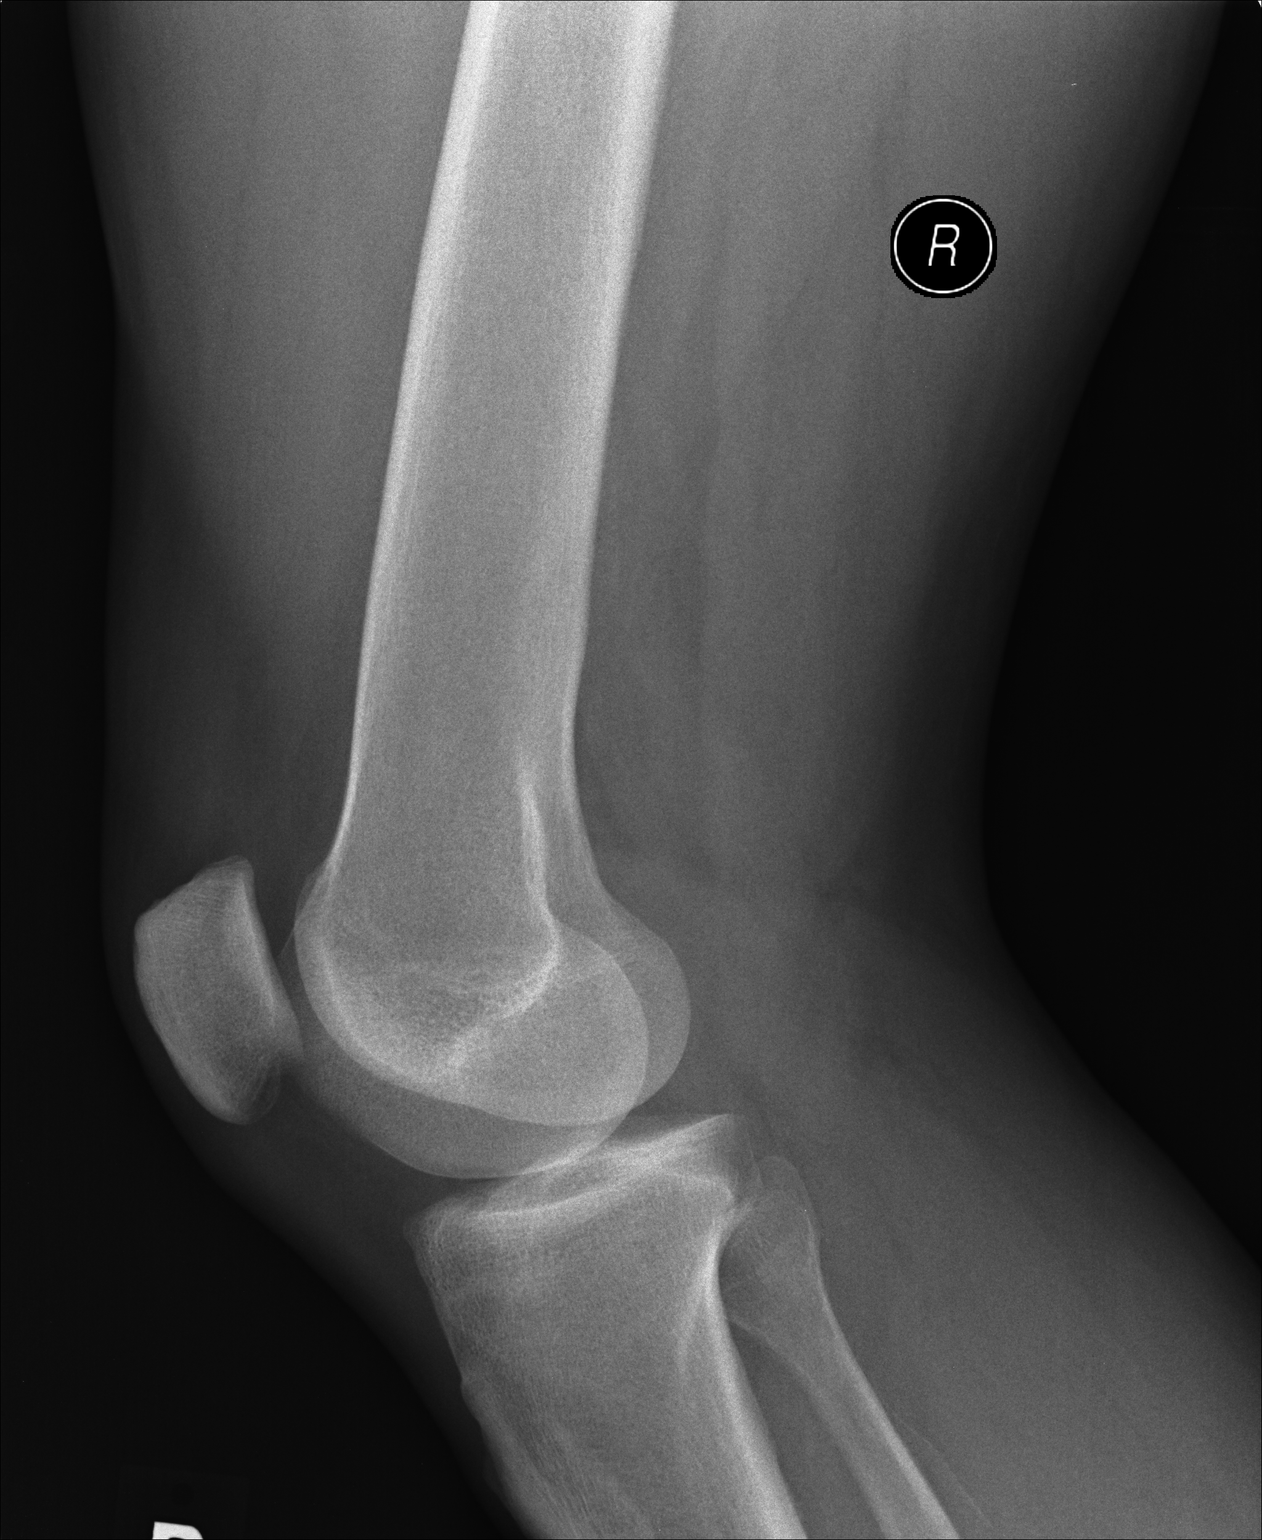

[AP]
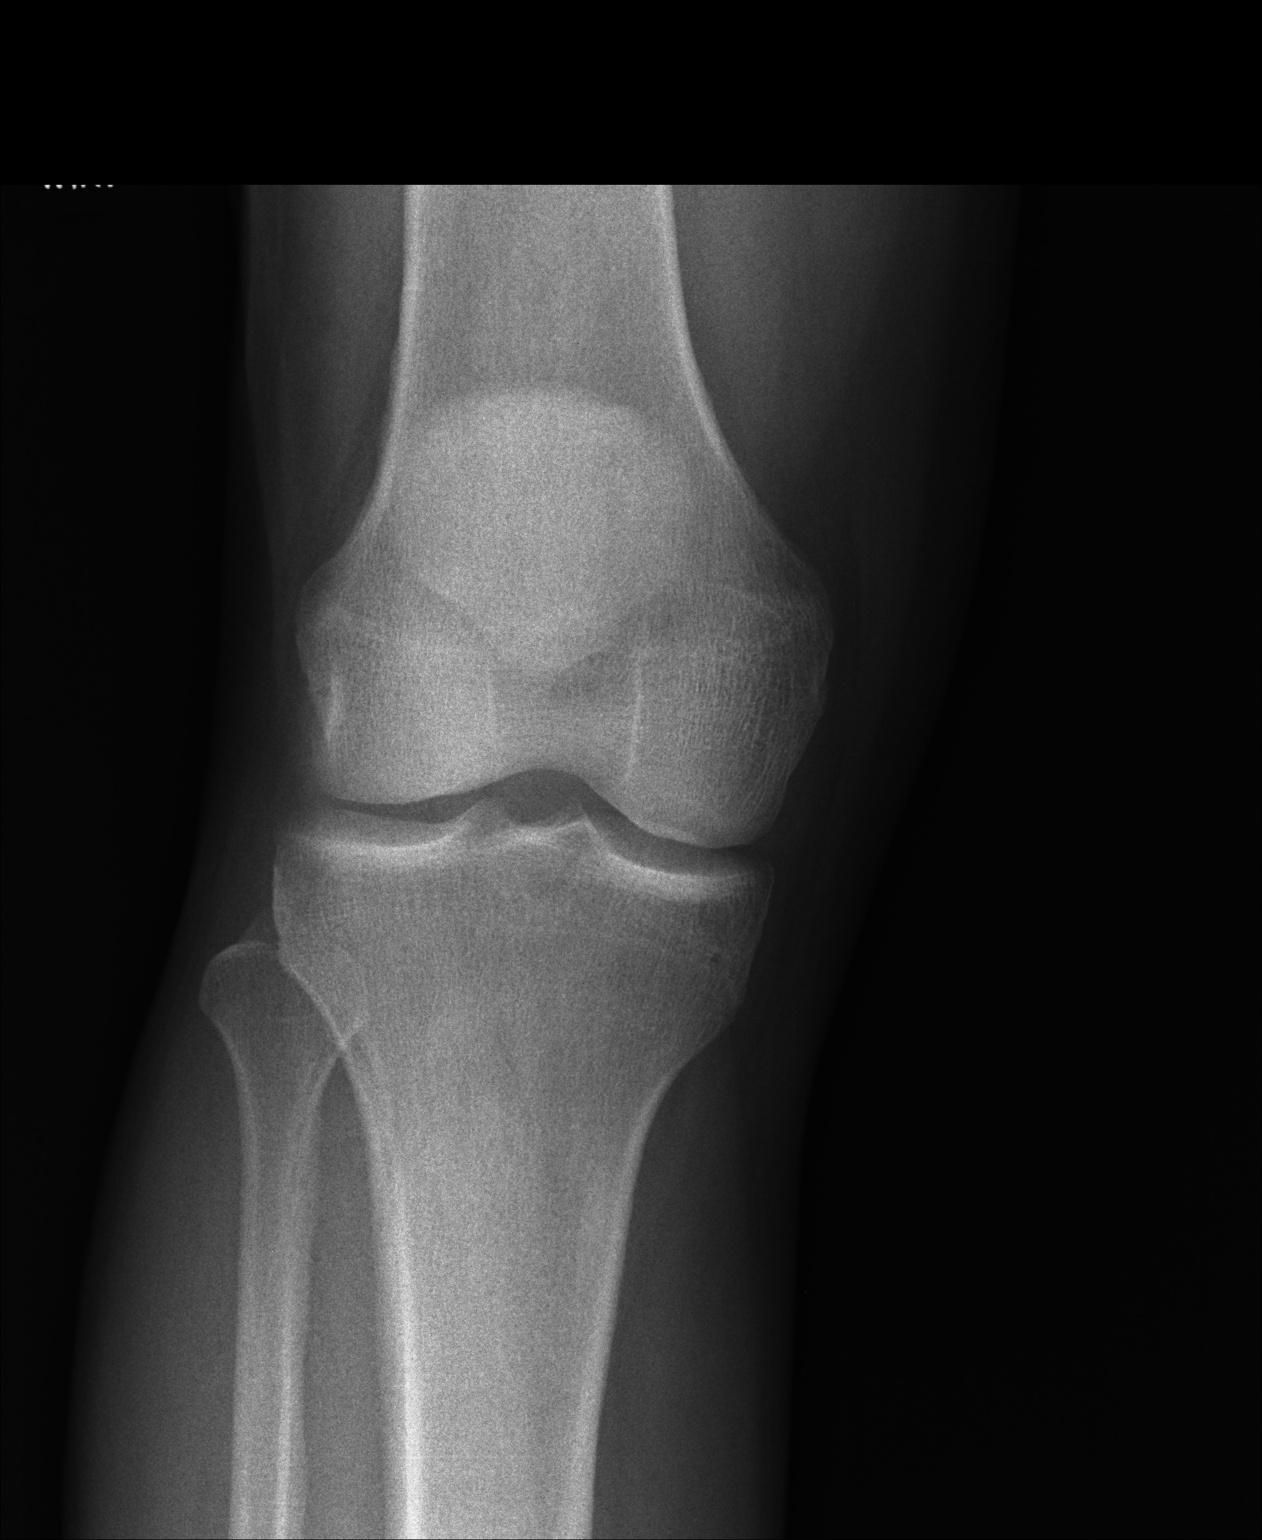

[other]
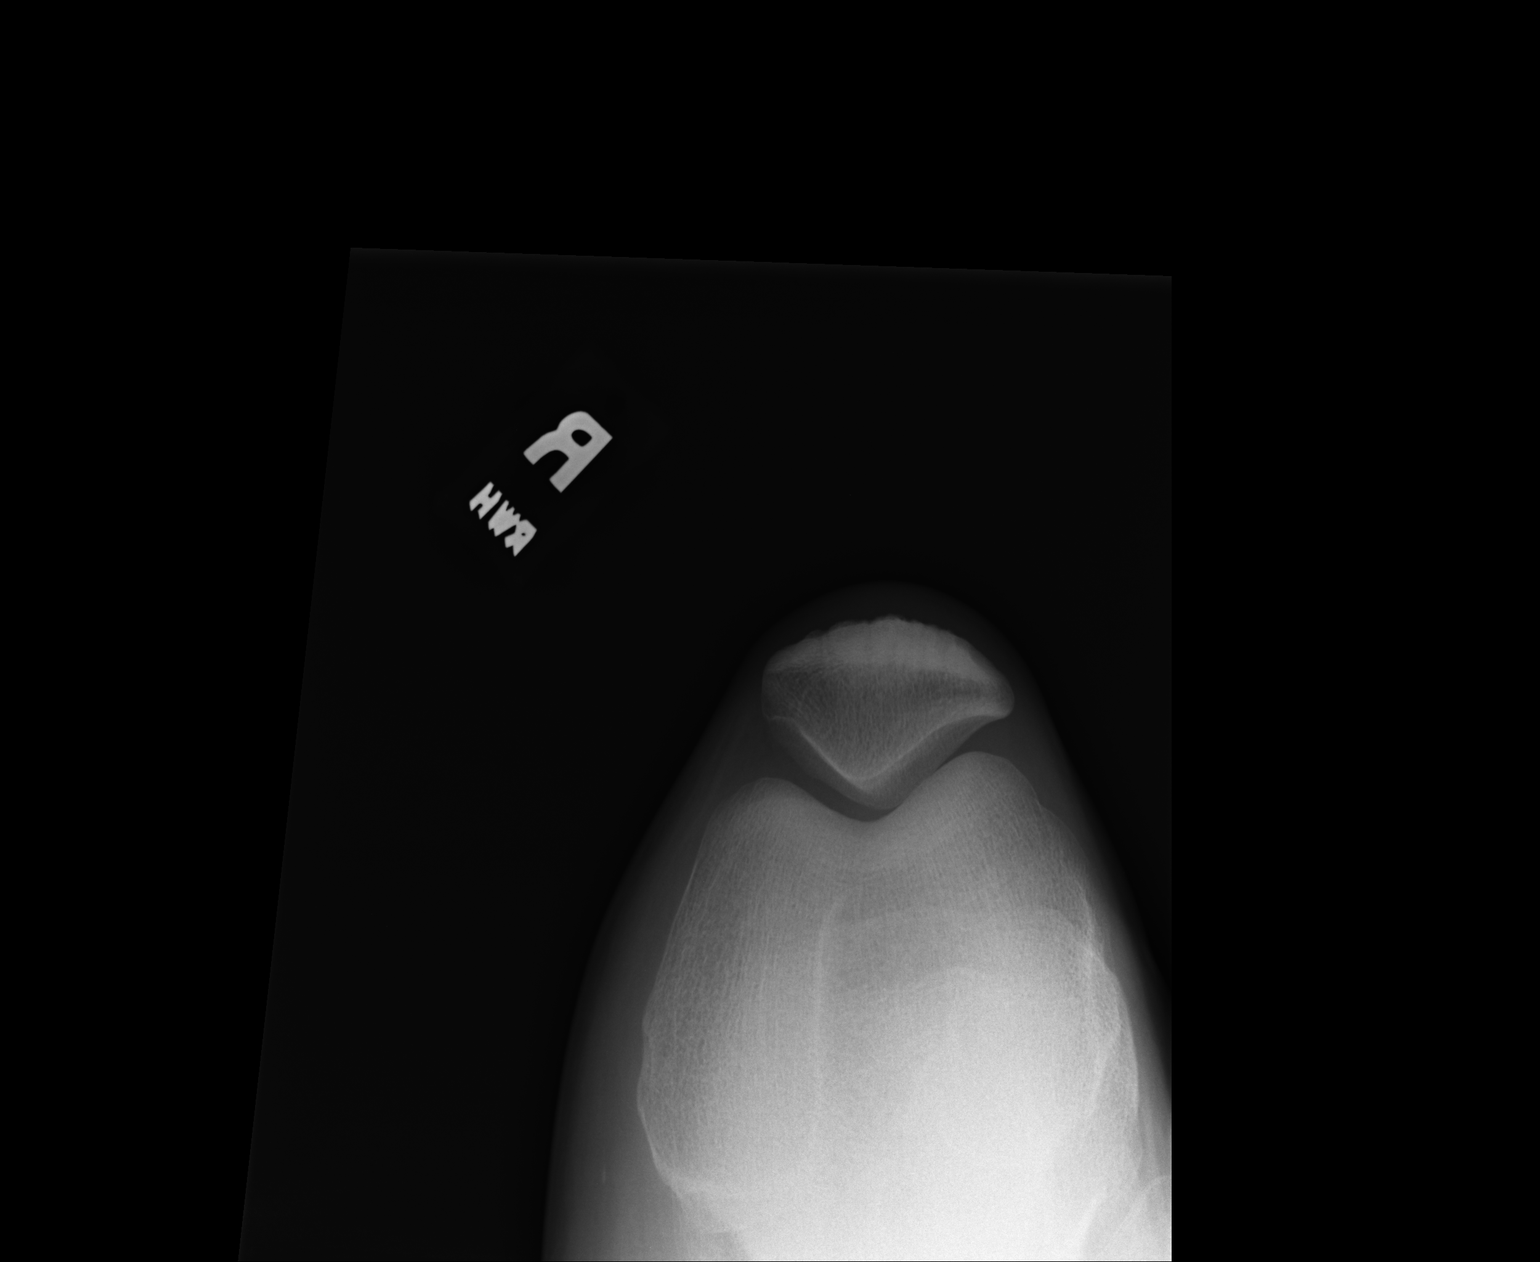

[3 of 3 positions shown; findings below may reference images not displayed]

FINDINGS: Normal bony mineralization. Probable slight medial joint
space narrowing.  The lateral and patellofemoral compartment joint
spaces are preserved.  Negative for osteophyte formation, fracture,
or focal bony abnormality.  Negative for abnormal calcification or
joint effusion.
IMPRESSION: Question mild medial joint space narrowing.  Otherwise, negative
exam.

## 2015-01-01 ENCOUNTER — Encounter (HOSPITAL_COMMUNITY): Payer: Self-pay | Admitting: *Deleted

## 2015-01-01 ENCOUNTER — Emergency Department (HOSPITAL_COMMUNITY)
Admission: EM | Admit: 2015-01-01 | Discharge: 2015-01-01 | Disposition: A | Discharge status: Home / Self Care | Payer: BLUE CROSS/BLUE SHIELD | Attending: Emergency Medicine | Admitting: Emergency Medicine

## 2015-01-01 DIAGNOSIS — Z8639 Personal history of other endocrine, nutritional and metabolic disease: Secondary | ICD-10-CM | POA: Diagnosis not present

## 2015-01-01 DIAGNOSIS — R21 Rash and other nonspecific skin eruption: Secondary | ICD-10-CM | POA: Diagnosis present

## 2015-01-01 DIAGNOSIS — Z79899 Other long term (current) drug therapy: Secondary | ICD-10-CM | POA: Insufficient documentation

## 2015-01-01 DIAGNOSIS — Z87891 Personal history of nicotine dependence: Secondary | ICD-10-CM | POA: Insufficient documentation

## 2015-01-01 DIAGNOSIS — K219 Gastro-esophageal reflux disease without esophagitis: Secondary | ICD-10-CM | POA: Insufficient documentation

## 2015-01-01 DIAGNOSIS — R000 Tachycardia, unspecified: Secondary | ICD-10-CM | POA: Diagnosis not present

## 2015-01-01 DIAGNOSIS — Z8739 Personal history of other diseases of the musculoskeletal system and connective tissue: Secondary | ICD-10-CM | POA: Insufficient documentation

## 2015-01-01 DIAGNOSIS — Z791 Long term (current) use of non-steroidal anti-inflammatories (NSAID): Secondary | ICD-10-CM | POA: Diagnosis not present

## 2015-01-01 MED ORDER — METHYLPREDNISOLONE SODIUM SUCC 125 MG IJ SOLR
125.0000 mg | Freq: Once | INTRAMUSCULAR | Status: AC
  Administered 2015-01-01: 125 mg via INTRAMUSCULAR
  Filled 2015-01-01: qty 2

## 2015-01-01 MED ORDER — TRIAMCINOLONE ACETONIDE 0.1 % EX CREA: 1.0000 "application " | TOPICAL_CREAM | Freq: Four times a day (QID) | CUTANEOUS | Status: DC | PRN

## 2015-01-01 MED ORDER — DIPHENHYDRAMINE HCL 25 MG PO TABS: 25.0000 mg | ORAL_TABLET | Freq: Four times a day (QID) | ORAL | Status: DC

## 2015-01-01 MED ORDER — RANITIDINE HCL 150 MG PO TABS: 150.0000 mg | ORAL_TABLET | Freq: Two times a day (BID) | ORAL | Status: DC

## 2015-01-01 MED ORDER — PREDNISONE 20 MG PO TABS: 40.0000 mg | ORAL_TABLET | Freq: Every day | ORAL | Status: DC

## 2015-01-01 NOTE — ED Notes (Signed)
Pt complains of an itching rash all over his body for the past week. Pt states he has tried using dry skin lotion without relief. Pt denies using any new soaps laundry detergent or lotions. Pt states a similar rash has occurred in the past but not this bad.

## 2015-01-01 NOTE — ED Notes (Signed)
PA at bedside.

## 2015-01-01 NOTE — Discharge Instructions (Signed)
Allergies °Allergies may happen from anything your body is sensitive to. This may be food, medicines, pollens, chemicals, and nearly anything around you in everyday life that produces allergens. An allergen is anything that causes an allergy producing substance. Heredity is often a factor in causing these problems. This means you may have some of the same allergies as your parents. °Food allergies happen in all age groups. Food allergies are some of the most severe and life threatening. Some common food allergies are cow's milk, seafood, eggs, nuts, wheat, and soybeans. °SYMPTOMS  °· Swelling around the mouth. °· An itchy red rash or hives. °· Vomiting or diarrhea. °· Difficulty breathing. °SEVERE ALLERGIC REACTIONS ARE LIFE-THREATENING. °This reaction is called anaphylaxis. It can cause the mouth and throat to swell and cause difficulty with breathing and swallowing. In severe reactions only a trace amount of food (for example, peanut oil in a salad) may cause death within seconds. °Seasonal allergies occur in all age groups. These are seasonal because they usually occur during the same season every year. They may be a reaction to molds, grass pollens, or tree pollens. Other causes of problems are house dust mite allergens, pet dander, and mold spores. The symptoms often consist of nasal congestion, a runny itchy nose associated with sneezing, and tearing itchy eyes. There is often an associated itching of the mouth and ears. The problems happen when you come in contact with pollens and other allergens. Allergens are the particles in the air that the body reacts to with an allergic reaction. This causes you to release allergic antibodies. Through a chain of events, these eventually cause you to release histamine into the blood stream. Although it is meant to be protective to the body, it is this release that causes your discomfort. This is why you were given anti-histamines to feel better.  If you are unable to  pinpoint the offending allergen, it may be determined by skin or blood testing. Allergies cannot be cured but can be controlled with medicine. °Hay fever is a collection of all or some of the seasonal allergy problems. It may often be treated with simple over-the-counter medicine such as diphenhydramine. Take medicine as directed. Do not drink alcohol or drive while taking this medicine. Check with your caregiver or package insert for child dosages. °If these medicines are not effective, there are many new medicines your caregiver can prescribe. Stronger medicine such as nasal spray, eye drops, and corticosteroids may be used if the first things you try do not work well. Other treatments such as immunotherapy or desensitizing injections can be used if all else fails. Follow up with your caregiver if problems continue. These seasonal allergies are usually not life threatening. They are generally more of a nuisance that can often be handled using medicine. °HOME CARE INSTRUCTIONS  °· If unsure what causes a reaction, keep a diary of foods eaten and symptoms that follow. Avoid foods that cause reactions. °· If hives or rash are present: °· Take medicine as directed. °· You may use an over-the-counter antihistamine (diphenhydramine) for hives and itching as needed. °· Apply cold compresses (cloths) to the skin or take baths in cool water. Avoid hot baths or showers. Heat will make a rash and itching worse. °· If you are severely allergic: °· Following a treatment for a severe reaction, hospitalization is often required for closer follow-up. °· Wear a medic-alert bracelet or necklace stating the allergy. °· You and your family must learn how to give adrenaline or use   an anaphylaxis kit.  If you have had a severe reaction, always carry your anaphylaxis kit or EpiPen with you. Use this medicine as directed by your caregiver if a severe reaction is occurring. Failure to do so could have a fatal outcome. SEEK MEDICAL  CARE IF:  You suspect a food allergy. Symptoms generally happen within 30 minutes of eating a food.  Your symptoms have not gone away within 2 days or are getting worse.  You develop new symptoms.  You want to retest yourself or your child with a food or drink you think causes an allergic reaction. Never do this if an anaphylactic reaction to that food or drink has happened before. Only do this under the care of a caregiver. SEEK IMMEDIATE MEDICAL CARE IF:   You have difficulty breathing, are wheezing, or have a tight feeling in your chest or throat.  You have a swollen mouth, or you have hives, swelling, or itching all over your body.  You have had a severe reaction that has responded to your anaphylaxis kit or an EpiPen. These reactions may return when the medicine has worn off. These reactions should be considered life threatening. MAKE SURE YOU:   Understand these instructions.  Will watch your condition.  Will get help right away if you are not doing well or get worse. Document Released: 12/11/2002 Document Revised: 01/12/2013 Document Reviewed: 05/17/2008 [facility] Patient Information 2015 [place], [place]. This information is not intended to replace advice given to you by your health care provider. Make sure you discuss any questions you have with your health care provider.  Contact Dermatitis Contact dermatitis is a reaction to certain substances that touch the skin. Contact dermatitis can be either irritant contact dermatitis or allergic contact dermatitis. Irritant contact dermatitis does not require previous exposure to the substance for a reaction to occur.Allergic contact dermatitis only occurs if you have been exposed to the substance before. Upon a repeat exposure, your body reacts to the substance.  CAUSES  Many substances can cause contact dermatitis. Irritant dermatitis is most commonly caused by repeated exposure to mildly irritating substances, such  as:  Makeup.  Soaps.  Detergents.  Bleaches.  Acids.  Metal salts, such as nickel. Allergic contact dermatitis is most commonly caused by exposure to:  Poisonous plants.  Chemicals (deodorants, shampoos).  Jewelry.  Latex.  Neomycin in triple antibiotic cream.  Preservatives in products, including clothing. SYMPTOMS  The area of skin that is exposed may develop:  Dryness or flaking.  Redness.  Cracks.  Itching.  Pain or a burning sensation.  Blisters. With allergic contact dermatitis, there may also be swelling in areas such as the eyelids, mouth, or genitals.  DIAGNOSIS  Your caregiver can usually tell what the problem is by doing a physical exam. In cases where the cause is uncertain and an allergic contact dermatitis is suspected, a patch skin test may be performed to help determine the cause of your dermatitis. TREATMENT Treatment includes protecting the skin from further contact with the irritating substance by avoiding that substance if possible. Barrier creams, powders, and gloves may be helpful. Your caregiver may also recommend:  Steroid creams or ointments applied 2 times daily. For best results, soak the rash area in cool water for 20 minutes. Then apply the medicine. Cover the area with a plastic wrap. You can store the steroid cream in the refrigerator for a "chilly" effect on your rash. That may decrease itching. Oral steroid medicines may be needed in more severe  cases.  Antibiotics or antibacterial ointments if a skin infection is present.  Antihistamine lotion or an antihistamine taken by mouth to ease itching.  Lubricants to keep moisture in your skin.  Burow's solution to reduce redness and soreness or to dry a weeping rash. Mix one packet or tablet of solution in 2 cups cool water. Dip a clean washcloth in the mixture, wring it out a bit, and put it on the affected area. Leave the cloth in place for 30 minutes. Do this as often as possible  throughout the day.  Taking several cornstarch or baking soda baths daily if the area is too large to cover with a washcloth. Harsh chemicals, such as alkalis or acids, can cause skin damage that is like a burn. You should flush your skin for 15 to 20 minutes with cold water after such an exposure. You should also seek immediate medical care after exposure. Bandages (dressings), antibiotics, and pain medicine may be needed for severely irritated skin.  HOME CARE INSTRUCTIONS  Avoid the substance that caused your reaction.  Keep the area of skin that is affected away from hot water, soap, sunlight, chemicals, acidic substances, or anything else that would irritate your skin.  Do not scratch the rash. Scratching may cause the rash to become infected.  You may take cool baths to help stop the itching.  Only take over-the-counter or prescription medicines as directed by your caregiver.  See your caregiver for follow-up care as directed to make sure your skin is healing properly. SEEK MEDICAL CARE IF:   Your condition is not better after 3 days of treatment.  You seem to be getting worse.  You see signs of infection such as swelling, tenderness, redness, soreness, or warmth in the affected area.  You have any problems related to your medicines. Document Released: 09/14/2000 Document Revised: 12/10/2011 Document Reviewed: 02/20/2011 [facility] Patient Information 2015 [place], [place]. This information is not intended to replace advice given to you by your health care provider. Make sure you discuss any questions you have with your health care provider.  Please take your medications as directed. Please follow-up with her primary care for further evaluation management of your symptoms. Return to ED for new or worsening symptoms, specifically fever, worsening rash, nausea or vomiting, abdominal pain, difficulties breathing or swallowing.

## 2015-01-01 NOTE — ED Provider Notes (Signed)
CSN: [id number]     Arrival date & time 01/01/15  1304 History  This chart was scribed for Joycie PeekBenjamin Ansel Ferrall, PA-C working with Raeford RazorStephen Kohut, MD by Elveria Risingimelie Horne, ED Scribe. This patient was seen in room WTR5/WTR5 and the patient's care was started at 1:13 PM.   Chief Complaint  Patient presents with  . Rash   HPI HPI Comments: Alexander [name] is a 54 y.o. male who presents to the Emergency Department complaining of generalized rash to his body onset one week ago. Patient reports that the initially developed to his arms and has since spread to his legs, neck, back, abdomen and face. Patient likens rash to being sun burned and describes dry skin, pruritis, warmth and stinging pain. Patient reports sleep disturbance due to his symptoms, stating he wakes up to apply cream to relieve his pain. Patient denies exposure or new medications; he does take medication for gout but denies change or new additions. Denies nausea, vomiting, abdominal pain, difficulties breathing, mild swelling. No known exposure to insects or other stings/bites. Patient reports attempted treatment with an array of OTC medications including Benadryl without relief. Patient shares history of similar rash and endorses known history of venous stasis.   Patient denies contacts with similar symptoms or rash. Patient denies history of eczema.  PCP: Dr. Clelia CroftShaw  Past Medical History  Diagnosis Date  . Gout   . History of gastroesophageal reflux (GERD)   . Overweight (BMI 25.0-29.9)   . Gout 2007  . Diverticulitis 2012  . Allergy   . Arthritis    Past Surgical History  Procedure Laterality Date  . None    . Hemorrhoid     Family History  Problem Relation Age of Onset  . Hypertension Mother   . Gout Mother   . Hypertension Father   . Diabetes Maternal Aunt   . Diabetes Paternal Aunt   . Hypertension Sister   . Hypertension Brother    History  Substance Use Topics  . Smoking status: Former Smoker -- 1.00 packs/day    Types:  Cigarettes  . Smokeless tobacco: Never Used     Comment: quit 1990's  . Alcohol Use: No     Comment: occassional    Review of Systems  Constitutional: Negative for fever and chills.  Respiratory: Negative for shortness of breath.   Cardiovascular: Negative for chest pain.  Gastrointestinal: Negative for nausea, vomiting, abdominal pain, diarrhea and constipation.  Genitourinary: Negative for dysuria and hematuria.  Skin: Positive for rash.    Allergies  Review of patient's allergies indicates no known allergies.  Home Medications   Prior to Admission medications   Medication Sig Start Date End Date Taking? Authorizing Provider  Ammonium Lactate 10 % LOTN Apply 1 application topically 2 (two) times daily as needed (dry itching skin). 09/17/14   [name], MD  diclofenac (VOLTAREN) 75 MG EC tablet Take 1 tablet (75 mg total) by mouth 2 (two) times daily. 09/17/14   [name], MD  diphenhydrAMINE (BENADRYL) 25 MG tablet Take 1 tablet (25 mg total) by mouth every 6 (six) hours. 01/01/15   Joycie PeekBenjamin Kymberli Wiegand, PA-C  hydrOXYzine (VISTARIL) 25 MG capsule Take one after work, and 2 at bedtime for itching. May cause drowsiness in the daytime Patient not taking: Reported on 09/17/2014 12/11/13   Peyton Najjaravid H Hopper, MD  predniSONE (DELTASONE) 20 MG tablet Take 2 tablets (40 mg total) by mouth daily. 01/01/15   Joycie PeekBenjamin Peder Allums, PA-C  ranitidine (ZANTAC) 150 MG tablet  Take 1 tablet (150 mg total) by mouth 2 (two) times daily. 01/01/15   Joycie Peek, PA-C  traMADol (ULTRAM) 50 MG tablet Take 1 tablet (50 mg total) by mouth every 8 (eight) hours as needed. 09/17/14   Sherren Mocha, MD  triamcinolone cream (KENALOG) 0.1 % Apply 1 application topically 4 (four) times daily as needed. 01/01/15   Joycie Peek, PA-C   Triage Vitals: BP 139/89 mmHg  Pulse 114  Temp(Src) 97.9 F (36.6 C) (Oral)  Resp 18  SpO2 99% Physical Exam  Constitutional: He is oriented to person, place, and time. He appears  well-developed and well-nourished. No distress.  HENT:  Head: Normocephalic and atraumatic.  Eyes: EOM are normal.  Neck: Neck supple. No tracheal deviation present.  Cardiovascular: Normal rate, regular rhythm and normal heart sounds.   Mild tachycardia.   Pulmonary/Chest: Effort normal. No respiratory distress.  Musculoskeletal: Normal range of motion.  Neurological: He is alert and oriented to person, place, and time.  Skin: Skin is warm and dry. Rash noted.  Diffuse maculopapular rash to abdomen, back, and extremities. Rash is dry scaly and has sandpaper quality. Mild excoriations. No vesicles blistering or drainage. Worse in the arm folds and neck folds. Similar to eczema. Negative Nikolsky.  Psychiatric: He has a normal mood and affect. His behavior is normal.  Nursing note and vitals reviewed.   ED Course  Procedures (including critical care time)  COORDINATION OF CARE: 1:24 PM- Plans to consult attending. Discussed treatment plan with patient at bedside and patient agreed to plan.   Labs Review Labs Reviewed - No data to display  Imaging Review No results found.   EKG Interpretation None     Meds given in ED:  Medications  methylPREDNISolone sodium succinate (SOLU-MEDROL) 125 mg/2 mL injection 125 mg (125 mg Intramuscular Given 01/01/15 1335)    New Prescriptions   DIPHENHYDRAMINE (BENADRYL) 25 MG TABLET    Take 1 tablet (25 mg total) by mouth every 6 (six) hours.   PREDNISONE (DELTASONE) 20 MG TABLET    Take 2 tablets (40 mg total) by mouth daily.   RANITIDINE (ZANTAC) 150 MG TABLET    Take 1 tablet (150 mg total) by mouth 2 (two) times daily.   TRIAMCINOLONE CREAM (KENALOG) 0.1 %    Apply 1 application topically 4 (four) times daily as needed.   Filed Vitals:   01/01/15 1311  BP: 139/89  Pulse: 114  Temp: 97.9 F (36.6 C)  TempSrc: Oral  Resp: 18  SpO2: 99%    MDM  Vitals stable  -afebrile. Nonspecific tachycardia-improving in ED. Pt resting  comfortably in ED. Given Solu-Medrol injection in ED. PE--patient with diffuse maculopapular rash not consistent with Stevens-Johnson's, TENs, necrotizing fasciitis, pityriasis rosea. Nonspecific skin eruption  DDX--patient denies any signs of anaphylaxis. No new medications or obvious inciting event. Will treat with steroid Dosepak, triamcinolone cream, Benadryl and Zantac. Discussed avoidance of excoriations as this can lead to infection. Discussed follow-up with primary care for further evaluation and management of symptoms.  I discussed all relevant lab findings and imaging results with pt and they verbalized understanding. Discussed f/u with PCP within 48 hrs and return precautions, pt very amenable to plan. Prior to patient discharge, I discussed and reviewed this case with Dr. Juleen China, who also saw and evaluated the patient     Final diagnoses:  Rash and nonspecific skin eruption    I personally performed the services described in this documentation, which was scribed in my  presence. The recorded information has been reviewed and is accurate.    Joycie Peek, PA-C 01/01/15 1413  Raeford Razor, MD 01/02/15 [phone]

## 2015-01-04 ENCOUNTER — Telehealth: Payer: Self-pay

## 2015-01-04 NOTE — Telephone Encounter (Signed)
Pt states he would like us to fax a copy of his appt time with Dr.Shaw on Friday so he can turn it in to his employer Please fax to [phone] and you may reach pt at [phone] if needed

## 2015-01-04 NOTE — Telephone Encounter (Signed)
Completed letter and faxed 

## 2015-01-07 ENCOUNTER — Ambulatory Visit: Payer: Self-pay | Admitting: Family Medicine

## 2015-03-11 ENCOUNTER — Ambulatory Visit: Payer: BC Managed Care – PPO | Admitting: Family Medicine

## 2015-03-18 ENCOUNTER — Encounter: Payer: Self-pay | Admitting: Family Medicine

## 2015-03-23 ENCOUNTER — Telehealth: Payer: Self-pay | Admitting: Family Medicine

## 2015-03-23 NOTE — Telephone Encounter (Signed)
Patient dropped off FMLA forms on 03/22/2015. Forms have already been completed. Dr. Clelia Croft, can you review and sign? I have placed these forms in your box on 03/23/2015. Please return to FMLA/Disabilities when finished.

## 2015-04-01 NOTE — Telephone Encounter (Signed)
Scanned/faxed/copy up front/notified patient

## 2015-04-01 NOTE — Telephone Encounter (Signed)
Form signed and returned.

## 2015-04-21 ENCOUNTER — Other Ambulatory Visit: Payer: Self-pay | Admitting: Family Medicine

## 2015-08-05 ENCOUNTER — Other Ambulatory Visit: Payer: Self-pay | Admitting: Family Medicine

## 2015-09-13 ENCOUNTER — Other Ambulatory Visit: Payer: Self-pay | Admitting: Family Medicine

## 2015-10-08 ENCOUNTER — Other Ambulatory Visit: Payer: Self-pay | Admitting: Family Medicine

## 2015-10-17 ENCOUNTER — Emergency Department (HOSPITAL_COMMUNITY)
Admission: EM | Admit: 2015-10-17 | Discharge: 2015-10-17 | Disposition: A | Discharge status: Home / Self Care | Payer: No Typology Code available for payment source | Attending: Emergency Medicine | Admitting: Emergency Medicine

## 2015-10-17 ENCOUNTER — Encounter (HOSPITAL_COMMUNITY): Payer: Self-pay | Admitting: Emergency Medicine

## 2015-10-17 DIAGNOSIS — S3992XA Unspecified injury of lower back, initial encounter: Secondary | ICD-10-CM | POA: Insufficient documentation

## 2015-10-17 DIAGNOSIS — M1990 Unspecified osteoarthritis, unspecified site: Secondary | ICD-10-CM | POA: Diagnosis not present

## 2015-10-17 DIAGNOSIS — S199XXA Unspecified injury of neck, initial encounter: Secondary | ICD-10-CM | POA: Insufficient documentation

## 2015-10-17 DIAGNOSIS — Y9389 Activity, other specified: Secondary | ICD-10-CM | POA: Insufficient documentation

## 2015-10-17 DIAGNOSIS — Z791 Long term (current) use of non-steroidal anti-inflammatories (NSAID): Secondary | ICD-10-CM | POA: Insufficient documentation

## 2015-10-17 DIAGNOSIS — M109 Gout, unspecified: Secondary | ICD-10-CM | POA: Insufficient documentation

## 2015-10-17 DIAGNOSIS — Z7952 Long term (current) use of systemic steroids: Secondary | ICD-10-CM | POA: Diagnosis not present

## 2015-10-17 DIAGNOSIS — E663 Overweight: Secondary | ICD-10-CM | POA: Diagnosis not present

## 2015-10-17 DIAGNOSIS — S299XXA Unspecified injury of thorax, initial encounter: Secondary | ICD-10-CM | POA: Insufficient documentation

## 2015-10-17 DIAGNOSIS — Z87891 Personal history of nicotine dependence: Secondary | ICD-10-CM | POA: Diagnosis not present

## 2015-10-17 DIAGNOSIS — S4992XA Unspecified injury of left shoulder and upper arm, initial encounter: Secondary | ICD-10-CM | POA: Insufficient documentation

## 2015-10-17 DIAGNOSIS — M545 Low back pain, unspecified: Secondary | ICD-10-CM

## 2015-10-17 DIAGNOSIS — Y998 Other external cause status: Secondary | ICD-10-CM | POA: Diagnosis not present

## 2015-10-17 DIAGNOSIS — M549 Dorsalgia, unspecified: Secondary | ICD-10-CM

## 2015-10-17 DIAGNOSIS — K219 Gastro-esophageal reflux disease without esophagitis: Secondary | ICD-10-CM | POA: Insufficient documentation

## 2015-10-17 DIAGNOSIS — Z79899 Other long term (current) drug therapy: Secondary | ICD-10-CM | POA: Insufficient documentation

## 2015-10-17 DIAGNOSIS — Y92410 Unspecified street and highway as the place of occurrence of the external cause: Secondary | ICD-10-CM | POA: Insufficient documentation

## 2015-10-17 MED ORDER — METHOCARBAMOL 500 MG PO TABS: 500.0000 mg | ORAL_TABLET | Freq: Two times a day (BID) | ORAL | Status: DC

## 2015-10-17 MED ORDER — NAPROXEN 500 MG PO TABS: 500.0000 mg | ORAL_TABLET | Freq: Two times a day (BID) | ORAL | Status: DC
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
## 2015-11-08 ENCOUNTER — Encounter (HOSPITAL_COMMUNITY): Payer: Self-pay | Admitting: Emergency Medicine

## 2015-11-08 ENCOUNTER — Emergency Department (HOSPITAL_COMMUNITY): Payer: BLUE CROSS/BLUE SHIELD

## 2015-11-08 ENCOUNTER — Emergency Department (HOSPITAL_COMMUNITY)
Admission: EM | Admit: 2015-11-08 | Discharge: 2015-11-08 | Disposition: A | Discharge status: Home / Self Care | Payer: BLUE CROSS/BLUE SHIELD | Attending: Emergency Medicine | Admitting: Emergency Medicine

## 2015-11-08 DIAGNOSIS — M1990 Unspecified osteoarthritis, unspecified site: Secondary | ICD-10-CM | POA: Insufficient documentation

## 2015-11-08 DIAGNOSIS — Z79899 Other long term (current) drug therapy: Secondary | ICD-10-CM | POA: Insufficient documentation

## 2015-11-08 DIAGNOSIS — E663 Overweight: Secondary | ICD-10-CM | POA: Insufficient documentation

## 2015-11-08 DIAGNOSIS — Z791 Long term (current) use of non-steroidal anti-inflammatories (NSAID): Secondary | ICD-10-CM | POA: Insufficient documentation

## 2015-11-08 DIAGNOSIS — M10071 Idiopathic gout, right ankle and foot: Secondary | ICD-10-CM | POA: Insufficient documentation

## 2015-11-08 DIAGNOSIS — M109 Gout, unspecified: Secondary | ICD-10-CM

## 2015-11-08 DIAGNOSIS — Z87891 Personal history of nicotine dependence: Secondary | ICD-10-CM | POA: Insufficient documentation

## 2015-11-08 DIAGNOSIS — K219 Gastro-esophageal reflux disease without esophagitis: Secondary | ICD-10-CM | POA: Insufficient documentation

## 2015-11-08 DIAGNOSIS — Z7952 Long term (current) use of systemic steroids: Secondary | ICD-10-CM | POA: Insufficient documentation

## 2015-11-08 MED ORDER — INDOMETHACIN 25 MG PO CAPS: 25.0000 mg | ORAL_CAPSULE | Freq: Three times a day (TID) | ORAL | Status: DC | PRN

## 2015-11-08 MED ORDER — INDOMETHACIN 50 MG PO CAPS
50.0000 mg | ORAL_CAPSULE | Freq: Once | ORAL | Status: AC
  Administered 2015-11-08: 50 mg via ORAL
  Filled 2015-11-08: qty 1

## 2015-11-08 MED ORDER — OXYCODONE-ACETAMINOPHEN 5-325 MG PO TABS: 2.0000 | ORAL_TABLET | ORAL | Status: DC | PRN

## 2015-11-08 NOTE — ED Notes (Signed)
XRAY CANCELLED

## 2015-11-08 NOTE — ED Provider Notes (Signed)
CSN: [id number]     Arrival date & time 11/08/15  1223 History   By signing my name below, I, Alexander [name], attest that this documentation has been prepared under the direction and in the presence of non-physician practitioner, Gaylyn Rong, PA-C. Electronically Signed: Freida [name], Scribe. 11/08/2015. 2:00 PM.    Chief Complaint  Patient presents with  . Gout    The history is provided by the patient. No language interpreter was used.    HPI Comments:  Alexander [name] is a 55 y.o. male with a history of gout, who presents to the Emergency Department complaining of moderate constant pain to his right great toe x 1 day. Pt states his pain is worsened by touching the area. He notes his pain is similar to past gout flare ups. He is non-complaint with his gout medication, diclofenac. No alleviating factors noted.   Past Medical History  Diagnosis Date  . Gout   . History of gastroesophageal reflux (GERD)   . Overweight (BMI 25.0-29.9)   . Gout 2007  . Diverticulitis 2012  . Allergy   . Arthritis    Past Surgical History  Procedure Laterality Date  . None    . Hemorrhoid     Family History  Problem Relation Age of Onset  . Hypertension Mother   . Gout Mother   . Hypertension Father   . Diabetes Maternal Aunt   . Diabetes Paternal Aunt   . Hypertension Sister   . Hypertension Brother    Social History  Substance Use Topics  . Smoking status: Former Smoker -- 0.00 packs/day  . Smokeless tobacco: Never Used     Comment: quit 1990's  . Alcohol Use: 1.8 oz/week    3 Cans of beer per week     Comment: occassional    Review of Systems  Constitutional: Negative for fever and chills.  Musculoskeletal: Positive for arthralgias (right great toe).    Allergies  Review of patient's allergies indicates no known allergies.  Home Medications   Prior to Admission medications   Medication Sig Start Date End Date Taking? Authorizing Provider  Ammonium Lactate 10 % LOTN Apply 1  application topically 2 (two) times daily as needed (dry itching skin). 09/17/14   Sherren Mocha, MD  diclofenac (VOLTAREN) 75 MG EC tablet Take 1 tablet (75 mg total) by mouth 2 (two) times daily. 10/10/15   Sherren Mocha, MD  diphenhydrAMINE (BENADRYL) 25 MG tablet Take 1 tablet (25 mg total) by mouth every 6 (six) hours. 01/01/15   Joycie Peek, PA-C  hydrOXYzine (VISTARIL) 25 MG capsule Take one after work, and 2 at bedtime for itching. May cause drowsiness in the daytime Patient not taking: Reported on 09/17/2014 12/11/13   Peyton Najjar, MD  methocarbamol (ROBAXIN) 500 MG tablet Take 1 tablet (500 mg total) by mouth 2 (two) times daily. 10/17/15   Heather Laisure, PA-C  naproxen (NAPROSYN) 500 MG tablet Take 1 tablet (500 mg total) by mouth 2 (two) times daily. 10/17/15   Heather Laisure, PA-C  predniSONE (DELTASONE) 20 MG tablet Take 2 tablets (40 mg total) by mouth daily. 01/01/15   Joycie Peek, PA-C  ranitidine (ZANTAC) 150 MG tablet Take 1 tablet (150 mg total) by mouth 2 (two) times daily. 01/01/15   Joycie Peek, PA-C  traMADol (ULTRAM) 50 MG tablet Take 1 tablet (50 mg total) by mouth every 8 (eight) hours as needed. 09/17/14   Sherren Mocha, MD  triamcinolone cream (KENALOG) 0.1 %  Apply 1 application topically 4 (four) times daily as needed. 01/01/15   Benjamin Cartner, PA-C   BP 131/82 mmHg  Pulse 110  Temp(Src) 98.7 F (37.1 C) (Oral)  Resp 18  SpO2 100% Physical Exam  Constitutional: He is oriented to person, place, and time. He appears well-developed and well-nourished. No distress.  HENT:  Head: Normocephalic and atraumatic.  Eyes: Conjunctivae are normal. Right eye exhibits no discharge. Left eye exhibits no discharge. No scleral icterus.  Cardiovascular: Normal rate.   Pulmonary/Chest: Effort normal.  Musculoskeletal:  TTP of R great toe. Redness noted. No streaking. Intact distal pulses.   Neurological: He is alert and oriented to person, place, and time. Coordination normal.   Skin: Skin is warm and dry. No rash noted. He is not diaphoretic. No erythema. No pallor.  Psychiatric: He has a normal mood and affect. His behavior is normal.  Nursing note and vitals reviewed.   ED Course  Procedures   DIAGNOSTIC STUDIES:  Oxygen Saturation is 100% on RA, normal by my interpretation.    COORDINATION OF CARE:  1:50 PM Will order XR of the right foot and administer indomethacin in ED.  Discussed treatment plan with pt at bedside and pt agreed to plan.  2:52 PM At this time pt would like to decline XR.     MDM   Final diagnoses:  Acute gout of right foot, unspecified cause    Pt presents with monoarticular pain, swelling and erythema.  Pt is afebrile and stable. Pt without known peptic ulcer disease and not receiving concurrent treatment on warfarin. Pt dc with indomethacin (50 mg PO TID) and short course of pain medication. Discussed that pt should respond to treatment with in 24 hour of begining treatment & likely resolve in 2-3 days. Follow up with PCP. Return precautions outlined in patient discharge instructions.    I personally performed the services described in this documentation, which was scribed in my presence. The recorded information has been reviewed and is accurate.     Dub Mikes, PA-C 11/08/15 1920  Melene Plan, DO 11/09/15 [id number]

## 2015-11-08 NOTE — Discharge Instructions (Signed)

## 2015-11-08 NOTE — ED Notes (Signed)
Pt states that he has hx of gout.  C/o gout flare up on rt great toe.  States it is swollen, red and hot.

## 2016-02-18 ENCOUNTER — Emergency Department (HOSPITAL_COMMUNITY): Payer: BLUE CROSS/BLUE SHIELD

## 2016-02-18 ENCOUNTER — Encounter (HOSPITAL_COMMUNITY): Payer: Self-pay | Admitting: Emergency Medicine

## 2016-02-18 ENCOUNTER — Emergency Department (HOSPITAL_COMMUNITY)
Admission: EM | Admit: 2016-02-18 | Discharge: 2016-02-18 | Disposition: A | Discharge status: Home / Self Care | Payer: BLUE CROSS/BLUE SHIELD | Attending: Emergency Medicine | Admitting: Emergency Medicine

## 2016-02-18 DIAGNOSIS — Z87891 Personal history of nicotine dependence: Secondary | ICD-10-CM | POA: Diagnosis not present

## 2016-02-18 DIAGNOSIS — R52 Pain, unspecified: Secondary | ICD-10-CM

## 2016-02-18 DIAGNOSIS — L02611 Cutaneous abscess of right foot: Secondary | ICD-10-CM | POA: Diagnosis not present

## 2016-02-18 DIAGNOSIS — M79674 Pain in right toe(s): Secondary | ICD-10-CM | POA: Diagnosis present

## 2016-02-18 DIAGNOSIS — Z79899 Other long term (current) drug therapy: Secondary | ICD-10-CM | POA: Diagnosis not present

## 2016-02-18 LAB — CBG MONITORING, ED: Glucose-Capillary: 76 mg/dL (ref 65–99)

## 2016-02-18 MED ORDER — HYDROCODONE-ACETAMINOPHEN 5-325 MG PO TABS
1.0000 | ORAL_TABLET | Freq: Once | ORAL | Status: AC
  Administered 2016-02-18: 1 via ORAL
  Filled 2016-02-18: qty 1

## 2016-02-18 MED ORDER — CLINDAMYCIN HCL 300 MG PO CAPS: 300.0000 mg | ORAL_CAPSULE | Freq: Four times a day (QID) | ORAL | Status: DC

## 2016-02-18 MED ORDER — LIDOCAINE HCL 2 % IJ SOLN
10.0000 mL | Freq: Once | INTRAMUSCULAR | Status: AC
  Administered 2016-02-18: 40 mg
  Filled 2016-02-18: qty 20

## 2016-02-18 MED ORDER — NAPROXEN 500 MG PO TABS: 500.0000 mg | ORAL_TABLET | Freq: Two times a day (BID) | ORAL | Status: DC

## 2016-02-18 MED ORDER — HYDROCODONE-ACETAMINOPHEN 5-325 MG PO TABS: 1.0000 | ORAL_TABLET | ORAL | Status: DC | PRN

## 2016-02-18 NOTE — ED Notes (Signed)
Pt reports swelling to the medial side of R index toe for the past several weeks. Tried otc medication for swelling with no relief.

## 2016-02-18 NOTE — ED Notes (Signed)
CBG 76 

## 2016-02-18 NOTE — ED Provider Notes (Signed)
CSN: [id number]     Arrival date & time 02/18/16  1453 History  By signing my name below, I, [name], attest that this documentation has been prepared under the direction and in the presence of Kaylei Frink Y Velita Quirk, [place]PA-C. Electronically Signed: [name], ED Scribe. 02/18/2016. 3:35 PM.    Chief Complaint  Patient presents with  . Toe Pain   Patient is a 55 y.o. male presenting with toe pain. The history is provided by the patient. No language interpreter was used.  Toe Pain   HPI Comments: Alexander [name] is a 55 y.o. male who presents to the Emergency Department complaining of right 2nd toe pain onset 1 week prior. He states that he initially had a "corn" on his foot that he was trying to remove. Pt has associated swelling and drainage. Pt states that he tried liquid corn removal with no relief. He states that the liquid corn removal made the area worse and he thinks that it may be infected. Pt states that movement and palpation makes the pain worse. He states that he believes that he may having a gout flare in his right great toe as well that began 1 day prior. Pt states that he has tried aleve for the pain with no relief. Pt denies fever, chills, nausea or vomiting.   Past Medical History  Diagnosis Date  . Gout   . History of gastroesophageal reflux (GERD)   . Overweight (BMI 25.0-29.9)   . Gout 2007  . Diverticulitis 2012  . Allergy   . Arthritis    Past Surgical History  Procedure Laterality Date  . None    . Hemorrhoid     Family History  Problem Relation Age of Onset  . Hypertension Mother   . Gout Mother   . Hypertension Father   . Diabetes Maternal Aunt   . Diabetes Paternal Aunt   . Hypertension Sister   . Hypertension Brother    Social History  Substance Use Topics  . Smoking status: Former Smoker -- 0.00 packs/day  . Smokeless tobacco: Never Used     Comment: quit 1990's  . Alcohol Use: 1.8 oz/week    3 Cans of beer per week     Comment: occassional     Review of Systems  Constitutional: Negative for fever and chills.  Gastrointestinal: Negative for nausea and vomiting.  Musculoskeletal: Positive for joint swelling and arthralgias.  All other systems reviewed and are negative.     Allergies  Review of patient's allergies indicates no known allergies.  Home Medications   Prior to Admission medications   Medication Sig Start Date End Date Taking? Authorizing Provider  Ammonium Lactate 10 % LOTN Apply 1 application topically 2 (two) times daily as needed (dry itching skin). 09/17/14   [name], MD  diclofenac (VOLTAREN) 75 MG EC tablet Take 1 tablet (75 mg total) by mouth 2 (two) times daily. 10/10/15   [name], MD  diphenhydrAMINE (BENADRYL) 25 MG tablet Take 1 tablet (25 mg total) by mouth every 6 (six) hours. 01/01/15   Joycie PeekBenjamin Cartner, PA-C  hydrOXYzine (VISTARIL) 25 MG capsule Take one after work, and 2 at bedtime for itching. May cause drowsiness in the daytime Patient not taking: Reported on 09/17/2014 12/11/13   Peyton Najjaravid H Hopper, MD  indomethacin (INDOCIN) 25 MG capsule Take 1 capsule (25 mg total) by mouth 3 (three) times daily as needed. 11/08/15   Samantha Tripp Dowless, PA-C  methocarbamol (ROBAXIN) 500 MG tablet Take  1 tablet (500 mg total) by mouth 2 (two) times daily. 10/17/15   Heather Laisure, PA-C  naproxen (NAPROSYN) 500 MG tablet Take 1 tablet (500 mg total) by mouth 2 (two) times daily. 10/17/15   Santiago Glad, PA-C  oxyCODONE-acetaminophen (PERCOCET/ROXICET) 5-325 MG tablet Take 2 tablets by mouth every 4 (four) hours as needed for severe pain. 11/08/15   Samantha Tripp Dowless, PA-C  predniSONE (DELTASONE) 20 MG tablet Take 2 tablets (40 mg total) by mouth daily. 01/01/15   Joycie Peek, PA-C  ranitidine (ZANTAC) 150 MG tablet Take 1 tablet (150 mg total) by mouth 2 (two) times daily. 01/01/15   Joycie Peek, PA-C  traMADol (ULTRAM) 50 MG tablet Take 1 tablet (50 mg total) by mouth every 8 (eight) hours as  needed. 09/17/14   Sherren Mocha, MD  triamcinolone cream (KENALOG) 0.1 % Apply 1 application topically 4 (four) times daily as needed. 01/01/15   Joycie Peek, PA-C   BP 125/82 mmHg  Pulse 106  Temp(Src) 98 F (36.7 C) (Oral)  Resp 17  SpO2 98%   Physical Exam  Constitutional: He is oriented to person, place, and time. He appears well-developed and well-nourished. No distress.  HENT:  Head: Normocephalic and atraumatic.  Eyes: Conjunctivae and EOM are normal.  Neck: Neck supple. No tracheal deviation present.  Cardiovascular: Normal rate.   Pulmonary/Chest: Effort normal. No respiratory distress.  Musculoskeletal: Normal range of motion.  Right 2nd toe edematous and erythematous. Area of fluctuance to medial aspect. Markedly tender to palpation.  Right great toe nonerythematous, nontender. Brisk cap refill.   2+ distal pulses.   Neurological: He is alert and oriented to person, place, and time.  Skin: Skin is warm and dry.  Psychiatric: He has a normal mood and affect. His behavior is normal.  Nursing note and vitals reviewed.   ED Course  Procedures (including critical care time) NERVE BLOCK Performed by: Noelle Penner Consent: Verbal consent obtained. Required items: required blood products, implants, devices, and special equipment available Time out: Immediately prior to procedure a "time out" was called to verify the correct patient, procedure, equipment, support staff and site/side marked as required.  Indication: right 2nd toe pain and abscess Nerve block body site: right 2nd toe  Preparation: Patient was prepped and draped in the usual sterile fashion. Needle gauge: 24 G Location technique: anatomical landmarks  Local anesthetic: lidocaine 2% without epi  Anesthetic total: 4 ml  Outcome: pain improved Patient tolerance: Patient tolerated the procedure well with no immediate complications.   DIAGNOSTIC STUDIES: Oxygen Saturation is 98% on RA, normal by my  interpretation.    COORDINATION OF CARE: 3:36 PM-Discussed treatment plan with pt at bedside and pt agreed to plan.     Labs Review Labs Reviewed  CBG MONITORING, ED    Imaging Review Dg Toe 2nd Right  02/18/2016  CLINICAL DATA:  Toe pain/ possible infection EXAM: RIGHT SECOND TOE COMPARISON:  None. FINDINGS: No fracture or dislocation is seen. Mild soft tissue swelling overlying the distal phalanx. No underlying cortical irregularity. The joint spaces are preserved. No radiopaque foreign body is seen. IMPRESSION: No acute osseous abnormality. No radiopaque foreign body is seen. Electronically Signed   By: Charline Bills M.D.   On: 02/18/2016 16:08   Dg Toe Great Right  02/18/2016  CLINICAL DATA:  Toe pain, possible infection EXAM: RIGHT GREAT TOE COMPARISON:  None. FINDINGS: Soft tissue swelling with gas overlying the 1st distal phalanx. No underlying cortical irregularity. The joint  spaces are preserved. No radiopaque foreign body is seen. No fracture or dislocation is seen. IMPRESSION: Soft tissue swelling with gas overlying the 1st distal phalanx. No acute osseous abnormality is seen. No radiopaque foreign body is seen. Electronically Signed   By: Charline Bills M.D.   On: 02/18/2016 16:09      EKG Interpretation None      MDM   Final diagnoses:  Pain  Abscess of second toe, right  Great toe pain, right    While in x-ray abscess of 2nd toe opened and started draining spontaneously. I performed a digital block as documented above to provide pain relief and manually expressed the rest of the purulent material from the wound. Area was not packed. X-ray of 2nd toe with some soft tissue swelling otherwise unremarkable. The great toe x-ray, however, reveals some soft tissue swelling with gas overlying the distal phalanx. Pt has no erythema or tenderness in this area. His pain in his great toe is mild. Pt was seen and evaluated in conjunction with Dr. Cyndie Chime. Pt denies h/o  diabetes. CBG in the ED is 76 today. He has no focal findings on exam of his great toe and after extensive discussion he would prefer to hold off on further workup today. Will treat with course of clindamycin. Strict ER return precautions given. Discussed wound care for abscess site. Rx for pain meds given as well. Pt verbalized his agreement and understanding of plan.   I personally performed the services described in this documentation, which was scribed in my presence. The recorded information has been reviewed and is accurate.     Carlene Coria, PA-C 02/18/16 2014  Leta Baptist, MD 02/26/16 1600

## 2016-02-18 NOTE — Discharge Instructions (Signed)
Take entire bottle of antibiotics as prescribed. Please pick up over the counter Probiotics at the pharmacy to take in addition to the antibiotics as they can be a bit tough on your stomach. I will give you a couple prescriptions to help with your pain. Continue warm soaks to help with the drainage. Return immediately to the emergency room for new or worsening symptoms.

## 2016-06-16 ENCOUNTER — Emergency Department (HOSPITAL_COMMUNITY)
Admission: EM | Admit: 2016-06-16 | Discharge: 2016-06-16 | Disposition: A | Discharge status: Home / Self Care | Payer: BLUE CROSS/BLUE SHIELD | Attending: Emergency Medicine | Admitting: Emergency Medicine

## 2016-06-16 ENCOUNTER — Encounter (HOSPITAL_COMMUNITY): Payer: Self-pay

## 2016-06-16 DIAGNOSIS — Z79899 Other long term (current) drug therapy: Secondary | ICD-10-CM | POA: Insufficient documentation

## 2016-06-16 DIAGNOSIS — Z791 Long term (current) use of non-steroidal anti-inflammatories (NSAID): Secondary | ICD-10-CM | POA: Diagnosis not present

## 2016-06-16 DIAGNOSIS — Z7952 Long term (current) use of systemic steroids: Secondary | ICD-10-CM | POA: Insufficient documentation

## 2016-06-16 DIAGNOSIS — Z87891 Personal history of nicotine dependence: Secondary | ICD-10-CM | POA: Insufficient documentation

## 2016-06-16 DIAGNOSIS — M10071 Idiopathic gout, right ankle and foot: Secondary | ICD-10-CM | POA: Diagnosis not present

## 2016-06-16 DIAGNOSIS — M109 Gout, unspecified: Secondary | ICD-10-CM

## 2016-06-16 DIAGNOSIS — M25571 Pain in right ankle and joints of right foot: Secondary | ICD-10-CM | POA: Diagnosis present

## 2016-06-16 MED ORDER — TRAMADOL HCL 50 MG PO TABS: 50.0000 mg | ORAL_TABLET | Freq: Four times a day (QID) | ORAL | 0 refills | Status: DC | PRN

## 2016-06-16 MED ORDER — COLCHICINE 0.6 MG PO TABS: 0.6000 mg | ORAL_TABLET | Freq: Every day | ORAL | 0 refills | Status: DC

## 2016-06-16 MED ORDER — INDOMETHACIN 50 MG PO CAPS: ORAL_CAPSULE | ORAL | 0 refills | Status: DC

## 2016-06-16 NOTE — ED Provider Notes (Signed)
[facility]-EMERGENCY DEPT Provider Note   CSN: [id number] Arrival date & time: 06/16/16  1357  By signing my name below, I, Nelwyn Salisbury, attest that this documentation has been prepared under the direction and in the presence of non-physician practitioner, Elizabeth Sauer, PA-C. Electronically Signed: Nelwyn Salisbury, Scribe. 06/16/2016. 2:09 PM.  History   Chief Complaint Chief Complaint  Patient presents with  . Gout  . Foot Pain   The history is provided by the patient. No language interpreter was used.    HPI Comments:  Alexander [name] is a 54 y.o. male who presents to the Emergency Department complaining of intermittent worsening right ankle pain onset two days ago. He describes his symptoms as a sharp pain, exacerbated by movement and palpation. Pt reports that he has a PMHx of gout and the symptoms are similar to his normal gout flare-ups. He states has ran out of his typical gout medication (colchicine) and is requesting another prescription. He usually takes this medication daily but has been out for approx. One week. Pt endorses associated swelling to the affected area. He denies any recent fever.   Past Medical History:  Diagnosis Date  . Allergy   . Arthritis   . Diverticulitis 2012  . Gout   . Gout 2007  . History of gastroesophageal reflux (GERD)   . Overweight (BMI 25.0-29.9)     Patient Active Problem List   Diagnosis Date Noted  . Dermatitis 11/02/2012  . Joint pain, foot 11/02/2012  . Elevated blood pressure 11/02/2012  . Diverticulitis of colon with perforation 09/21/2011  . Gout 09/21/2011  . GERD (gastroesophageal reflux disease) 09/21/2011  . BMI (body mass index) 20.0-29.9 09/21/2011    Past Surgical History:  Procedure Laterality Date  . hemorrhoid    . none      Home Medications    Prior to Admission medications   Medication Sig Start Date End Date Taking? Authorizing Provider  Ammonium Lactate 10 % LOTN Apply 1 application topically 2 (two) times daily  as needed (dry itching skin). 09/17/14   Sherren Mocha, MD  clindamycin (CLEOCIN) 300 MG capsule Take 1 capsule (300 mg total) by mouth every 6 (six) hours. 02/18/16   Carlene Coria, PA-C  diclofenac (VOLTAREN) 75 MG EC tablet Take 1 tablet (75 mg total) by mouth 2 (two) times daily. 10/10/15   Sherren Mocha, MD  diphenhydrAMINE (BENADRYL) 25 MG tablet Take 1 tablet (25 mg total) by mouth every 6 (six) hours. 01/01/15   Joycie Peek, PA-C  HYDROcodone-acetaminophen (NORCO/VICODIN) 5-325 MG tablet Take 1 tablet by mouth every 4 (four) hours as needed for severe pain. 02/18/16   Ace Gins Sam, PA-C  hydrOXYzine (VISTARIL) 25 MG capsule Take one after work, and 2 at bedtime for itching. May cause drowsiness in the daytime Patient not taking: Reported on 09/17/2014 12/11/13   Peyton Najjar, MD  indomethacin (INDOCIN) 25 MG capsule Take 1 capsule (25 mg total) by mouth 3 (three) times daily as needed. 11/08/15   Samantha Tripp Dowless, PA-C  methocarbamol (ROBAXIN) 500 MG tablet Take 1 tablet (500 mg total) by mouth 2 (two) times daily. 10/17/15   Heather Laisure, PA-C  naproxen (NAPROSYN) 500 MG tablet Take 1 tablet (500 mg total) by mouth 2 (two) times daily. 10/17/15   Heather Laisure, PA-C  naproxen (NAPROSYN) 500 MG tablet Take 1 tablet (500 mg total) by mouth 2 (two) times daily. 02/18/16   Ace Gins Sam, PA-C  oxyCODONE-acetaminophen (PERCOCET/ROXICET) 5-325 MG tablet  Take 2 tablets by mouth every 4 (four) hours as needed for severe pain. 11/08/15   Samantha Tripp Dowless, PA-C  predniSONE (DELTASONE) 20 MG tablet Take 2 tablets (40 mg total) by mouth daily. 01/01/15   Joycie Peek, PA-C  ranitidine (ZANTAC) 150 MG tablet Take 1 tablet (150 mg total) by mouth 2 (two) times daily. 01/01/15   Joycie Peek, PA-C  traMADol (ULTRAM) 50 MG tablet Take 1 tablet (50 mg total) by mouth every 8 (eight) hours as needed. 09/17/14   Sherren Mocha, MD  triamcinolone cream (KENALOG) 0.1 % Apply 1 application topically 4 (four)  times daily as needed. 01/01/15   Joycie Peek, PA-C    Family History Family History  Problem Relation Age of Onset  . Hypertension Mother   . Gout Mother   . Hypertension Father   . Diabetes Maternal Aunt   . Diabetes Paternal Aunt   . Hypertension Sister   . Hypertension Brother     Social History Social History  Substance Use Topics  . Smoking status: Former Smoker    Packs/day: 0.00  . Smokeless tobacco: Never Used     Comment: quit 1990's  . Alcohol use 1.8 oz/week    3 Cans of beer per week     Comment: occassional     Allergies   Review of patient's allergies indicates no known allergies.   Review of Systems Review of Systems  Constitutional: Negative for fever.  Musculoskeletal: Positive for arthralgias and joint swelling.     Physical Exam Updated Vital Signs BP 152/92 (BP Location: Right Arm)   Pulse 94   Temp 98 F (36.7 C) (Oral)   Resp 16   SpO2 98%   Physical Exam  Constitutional: He is oriented to person, place, and time. He appears well-developed and well-nourished. No distress.  HENT:  Head: Normocephalic and atraumatic.  Cardiovascular: Normal rate, regular rhythm and normal heart sounds.   2+ DP bilaterally.   Pulmonary/Chest: Effort normal and breath sounds normal. No respiratory distress. He has no wheezes. He has no rales. He exhibits no tenderness.  Musculoskeletal:  Right ankle with decreased ROM. Medial aspect of ankle is tender to palpation with swelling, erythema and mild warmth. No break in skin. Cap refill of all toes. Wiggling toes without difficulty. Sensation grossly intact.  Neurological: He is alert and oriented to person, place, and time.  Skin: Skin is warm and dry.  Nursing note and vitals reviewed.    ED Treatments / Results  DIAGNOSTIC STUDIES:  Oxygen Saturation is 98%, on RA, normal by my interpretation.    COORDINATION OF CARE:  2:27 PM Discussed treatment plan with pt at bedside which included  medication renewal and pt agreed to plan.  Labs (all labs ordered are listed, but only abnormal results are displayed) Labs Reviewed - No data to display  EKG  EKG Interpretation None       Radiology No results found.  Procedures Procedures (including critical care time)  Medications Ordered in ED Medications - No data to display   Initial Impression / Assessment and Plan / ED Course  I have reviewed the triage vital signs and the nursing notes.  Pertinent labs & imaging results that were available during my care of the patient were reviewed by me and considered in my medical decision making (see chart for details).  Clinical Course   Pt presents with monoarticular pain, swelling and erythema.  Pt is afebrile, well appearing and hemodynamically stable. Hx  of gout and sxs c/w typical gout flares. He has been out of prophylactic colchicine x 1 week and requesting refill of this medication which I have provided. Rx for indomethacin and short course of tramadol also given. Discussed that pt should respond to treatment with in 24 hour of begining treatment & likely resolve in 2-3 days. Follow up with PCP. Return precautions discussed and all questions answered.   Final Clinical Impressions(s) / ED Diagnoses   Final diagnoses:  Acute gout of right ankle, unspecified cause    New Prescriptions New Prescriptions   No medications on file   I personally performed the services described in this documentation, which was scribed in my presence. The recorded information has been reviewed and is accurate.     [facility] Surgery CenterJaime Pilcher Miliana Gangwer, PA-C 06/16/16 1511    Maia PlanJoshua G Long, MD 06/16/16 2002

## 2016-06-16 NOTE — ED Triage Notes (Signed)
Pt presents with rt foot pain and swelling to ankle. Denies injury HX of gout. Pt states He knows its gout

## 2016-06-16 NOTE — Discharge Instructions (Signed)
I have refilled your colchicine for you. Take indomethacin as directed. Take pain medication only as needed for severe pain - This can make you very drowsy - please do not drink alcohol, operate heavy machinery or drive on this medication. Follow up with your primary physician for discussion of today's diagnosis.  Return to ER for fevers, new or worsening symptoms, any additional concerns.

## 2016-06-20 ENCOUNTER — Telehealth: Payer: Self-pay

## 2016-06-20 NOTE — Telephone Encounter (Signed)
Patient needs FMLA paperwork filled out for an ongoing issues. Patient is going to fax it today and states he needs it soon as possible. I informed patient that he will need an appointment it the provider doesn't approve it. [phone]

## 2016-06-22 NOTE — Telephone Encounter (Signed)
Tried to call patient because we have not received any FMLA forms for him, but the number that was provided has been disconnected. Will wait for patient to call back to find out about forms.

## 2016-06-25 NOTE — Telephone Encounter (Signed)
Updated FMLA forms--left everything the same just added a note at the end stating patient can not stand on foot more than 10 hours a day for his job. refaxed them to his employer and let patient know if he needs something else changed he would have to come in a been seen for this issue, he understood.

## 2016-06-29 ENCOUNTER — Ambulatory Visit: Payer: Self-pay

## 2016-09-14 ENCOUNTER — Institutional Professional Consult (permissible substitution): Payer: Self-pay | Admitting: Medical

## 2016-12-10 ENCOUNTER — Institutional Professional Consult (permissible substitution): Payer: Self-pay | Admitting: Medical

## 2016-12-12 ENCOUNTER — Ambulatory Visit (INDEPENDENT_AMBULATORY_CARE_PROVIDER_SITE_OTHER): Payer: BLUE CROSS/BLUE SHIELD | Admitting: Medical

## 2016-12-12 ENCOUNTER — Other Ambulatory Visit: Payer: Self-pay

## 2016-12-12 ENCOUNTER — Encounter: Payer: Self-pay | Admitting: Medical

## 2016-12-12 VITALS — BP 112/70 | HR 94 | Ht 69.0 in | Wt 180.6 lb

## 2016-12-12 DIAGNOSIS — B353 Tinea pedis: Secondary | ICD-10-CM | POA: Insufficient documentation

## 2016-12-12 DIAGNOSIS — M1A9XX1 Chronic gout, unspecified, with tophus (tophi): Secondary | ICD-10-CM

## 2016-12-12 DIAGNOSIS — M21961 Unspecified acquired deformity of right lower leg: Secondary | ICD-10-CM | POA: Diagnosis not present

## 2016-12-12 DIAGNOSIS — M25571 Pain in right ankle and joints of right foot: Secondary | ICD-10-CM

## 2016-12-12 DIAGNOSIS — G8929 Other chronic pain: Secondary | ICD-10-CM | POA: Diagnosis not present

## 2016-12-12 DIAGNOSIS — L84 Corns and callosities: Secondary | ICD-10-CM | POA: Diagnosis not present

## 2016-12-12 DIAGNOSIS — M79671 Pain in right foot: Secondary | ICD-10-CM

## 2016-12-12 LAB — COMPREHENSIVE METABOLIC PANEL
ALT: 34 U/L (ref 9–46)
AST: 31 U/L (ref 10–35)
Albumin: 4.2 g/dL (ref 3.6–5.1)
Alkaline Phosphatase: 58 U/L (ref 40–115)
BUN: 19 mg/dL (ref 7–25)
CO2: 25 mmol/L (ref 20–31)
Calcium: 9.4 mg/dL (ref 8.6–10.3)
Chloride: 106 mmol/L (ref 98–110)
Creat: 0.89 mg/dL (ref 0.70–1.33)
Glucose, Bld: 83 mg/dL (ref 65–99)
Potassium: 3.9 mmol/L (ref 3.5–5.3)
Sodium: 140 mmol/L (ref 135–146)
Total Bilirubin: 0.3 mg/dL (ref 0.2–1.2)
Total Protein: 7.8 g/dL (ref 6.1–8.1)

## 2016-12-12 LAB — URIC ACID: Uric Acid, Serum: 9.4 mg/dL — ABNORMAL HIGH (ref 4.0–8.0)

## 2016-12-12 MED ORDER — INDOMETHACIN 50 MG PO CAPS: ORAL_CAPSULE | ORAL | 1 refills | Status: DC

## 2016-12-12 MED ORDER — TRAMADOL HCL 50 MG PO TABS: 50.0000 mg | ORAL_TABLET | Freq: Four times a day (QID) | ORAL | 0 refills | Status: DC | PRN

## 2016-12-12 MED ORDER — COLCHICINE 0.6 MG PO TABS: 0.6000 mg | ORAL_TABLET | Freq: Two times a day (BID) | ORAL | 1 refills | Status: DC

## 2016-12-12 MED ORDER — FEBUXOSTAT 40 MG PO TABS: 40.0000 mg | ORAL_TABLET | Freq: Every day | ORAL | 3 refills | Status: DC

## 2016-12-12 MED ORDER — TERBINAFINE HCL 1 % EX CREA: 1.0000 "application " | TOPICAL_CREAM | Freq: Two times a day (BID) | CUTANEOUS | 0 refills | Status: DC

## 2016-12-12 NOTE — Addendum Note (Signed)
Addended by: Winn JockVALENTINE, Elgin Carn N on: 12/12/2016 04:11 PM   Modules accepted: Orders

## 2016-12-12 NOTE — Progress Notes (Signed)
Subjective: Chief Complaint  Patient presents with  . New Patient (Initial Visit)    gout , rt ankle pain x 6-8 month   Here as a new patient.  Was seeing [facility] prior.  Had to pay a bill to urgent care to make this appt.    Has hx/o gout.   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  Having problem with right ankle swelling. Bottom of heel painful all the time.   Attributes this to gout.    Gets tingling in toes. Sometimes can stand and right leg gives out on him.  Is out of his medication.  Sometimes gets gout in right knee. Needs FMLA paperwork, has had this completed prior for gout.    Has never seen orthopedist or rheumatologist due to this.   Has been on preventative medication.   Was on a green pill and while pill for gout. He thinks he was on Allopurinol and Uloric.   Has used Indocin, Colchicine, and Naprosyn prior for flare up.   Drinks occasional beer.   denies eating a lot of sugary foods or snacks.   Drinks a good amount of water daily.   His gout usually flares in great toe or ankle.    Has missed 30-40 days of work in the past year due to these gout issues.   Has been late to work numerous times, maybe 10-15 times.    Denies other medical problems.  Past Medical History:  Diagnosis Date  . Allergy   . Arthritis   . Diverticulitis 2012  . Gout 2007  . History of gastroesophageal reflux (GERD)   . Overweight (BMI 25.0-29.9)    No current outpatient prescriptions on file prior to visit.   No current facility-administered medications on file prior to visit.    ROS as in subjective   Objective: BP 112/70   Pulse 94   Ht 5\' 9"  (1.753 m)   Wt 180 lb 9.6 oz (81.9 kg)   SpO2 98%   BMI 26.67 kg/m   Gen: wd, wn, nad, AA male Skin: patches of healing rash/rough skin of right distal anterior medial and lateral ankle, maceration between toes of right foot, some yellowish nails of bilat feet, Right 2nd toe medial surface with 1cm callus,  Left 2nd toe dorsal  middle phalanx with large callus, 1.5cm diameter No skin pitting, no other rash Lungs clear Heart rrr, normal s1, s2, no murmurs Ext: no edema Obvious right foot and ankle deformity, foot seems to be pronated in a way that the weight of the leg is on the medial foot and stressing the ankle.  He is unable to invert right foot, bony abnormal of right ankle, great toe in turns and has arthritis changes suggestive of bunion.   Left foot ROM WNL.  Knees nontender, normal ROM.  mildly decreased ROM of bilat hips Feet seem neurovascularly intact   Assessment: Encounter Diagnoses  Name Primary?  . Chronic gout with tophus, unspecified cause, unspecified site Yes  . Ankle and foot deformity, acquired, right   . Callus of foot   . Tinea pedis of right foot   . Chronic foot pain, right   . Chronic pain of right ankle      Plan: Discussed his concerns, symptoms, prior xrays and labs in chart.  No imaging or labs since 2015.    Recommendations: Begin back on Uloric 40mg  daily for gout prevention You can use EITHER Colchicine twice daily  OR  Indocin 50mg , 1 tablet up to every 8 hours for flare up Drink tart cherry juice for flare up You can use Ultram as needed for worse gout pain with flare up  Begin Lamisil cream daily between toes and of feet after night time bathing of feet and ankles  Use supportive shoes  We will refer you to orthopedist  I will work on your FMLA   Alexander [name] was seen today for new patient (initial visit).  Diagnoses and all orders for this visit:  Chronic gout with tophus, unspecified cause, unspecified site -     Uric acid -     Comprehensive metabolic panel -     Sedimentation rate -     Ambulatory referral to Orthopedic Surgery  Ankle and foot deformity, acquired, right -     Uric acid -     Comprehensive metabolic panel -     Sedimentation rate -     Ambulatory referral to Orthopedic Surgery  Callus of foot -     Uric acid -     Comprehensive  metabolic panel -     Ambulatory referral to Orthopedic Surgery  Tinea pedis of right foot -     Uric acid -     Comprehensive metabolic panel -     terbinafine (LAMISIL AT) 1 % cream; Apply 1 application topically 2 (two) times daily.  Chronic foot pain, right -     Uric acid -     Comprehensive metabolic panel -     Ambulatory referral to Orthopedic Surgery  Chronic pain of right ankle -     Uric acid -     Comprehensive metabolic panel -     Ambulatory referral to Orthopedic Surgery  Other orders -     febuxostat (ULORIC) 40 MG tablet; Take 1 tablet (40 mg total) by mouth daily. -     colchicine 0.6 MG tablet; Take 1 tablet (0.6 mg total) by mouth 2 (two) times daily. -     indomethacin (INDOCIN) 50 MG capsule; Take 1 capsule (50mg ) three times a day until pain is tolerable, then take 1 capsule twice a day x day, then take 1 capsule once daily until pain resolves. -     traMADol (ULTRAM) 50 MG tablet; Take 1 tablet (50 mg total) by mouth every 6 (six) hours as needed.

## 2016-12-12 NOTE — Patient Instructions (Signed)
Recommendations: Begin back on Uloric 40mg  daily for gout prevention You can use EITHER Colchicine twice daily  OR Indocin 50mg , 1 tablet up to every 8 hours for flare up Drink tart cherry juice for flare up You can use Ultram as needed for worse gout pain with flare up  Begin Lamisil cream daily between toes and of feet after night time bathing of feet and ankles  Use supportive shoes  We will refer you to orthopedist  I will work on your [employer]MLA

## 2016-12-13 LAB — SEDIMENTATION RATE: Sed Rate: 23 mm/hr — ABNORMAL HIGH (ref 0–20)

## 2016-12-31 ENCOUNTER — Telehealth: Payer: Self-pay | Admitting: Medical

## 2016-12-31 NOTE — Telephone Encounter (Signed)
Pt called and stated he has not heard anything about his orthopedic appt. He will call back tomorrow and speak to [facility] about referral. Pt also needs refills on Uloric and Tramadol.

## 2016-12-31 NOTE — Telephone Encounter (Signed)
This one is for you 

## 2017-01-01 ENCOUNTER — Other Ambulatory Visit: Payer: Self-pay

## 2017-01-01 NOTE — Telephone Encounter (Signed)
Pt called about his FMLA forms, and wanted to know if he has an flare up 15 minutes before he goes in to work  Will he be covered . I told him that the FLMA is for his gout flare up then that is what it for, put it up to his employer on what time his will need to call in , or what the policy is for him.

## 2017-01-01 NOTE — Telephone Encounter (Signed)
Pt needs to call the ortho. Back, they have l/m for him to call to set up an appt. Pt can't have a refill tramadol, this just for flare up and until he is seen pt ortho.

## 2017-01-03 MED ORDER — FEBUXOSTAT 40 MG PO TABS: 40.0000 mg | ORAL_TABLET | Freq: Every day | ORAL | 0 refills | Status: DC

## 2017-01-25 ENCOUNTER — Ambulatory Visit (INDEPENDENT_AMBULATORY_CARE_PROVIDER_SITE_OTHER): Payer: BLUE CROSS/BLUE SHIELD | Admitting: Orthopaedic Surgery

## 2017-01-25 ENCOUNTER — Encounter: Payer: BLUE CROSS/BLUE SHIELD | Admitting: Medical

## 2017-01-25 NOTE — Telephone Encounter (Signed)
Forwarding to Shauna °

## 2017-01-25 NOTE — Telephone Encounter (Signed)
Forwarding to you :)

## 2017-01-25 NOTE — Telephone Encounter (Signed)
D, send letter and call patient to see what happened

## 2017-01-25 NOTE — Telephone Encounter (Signed)
This patient no showed for their appointment today.Which of the following is necessary for this patient.   A) No follow-up necessary   B) Follow-up urgent. Locate Patient Immediately.   C) Follow-up necessary. Contact patient and Schedule visit in ____ Days.   D) Follow-up Advised. Contact patient and Schedule visit in ____ Days.   E) Please Send no show letter to patient. Charge no show fee if no show was a CPE.   

## 2017-02-01 ENCOUNTER — Encounter: Payer: Self-pay | Admitting: Medical

## 2017-06-01 HISTORY — PX: COLONOSCOPY: SHX174

## 2017-06-14 ENCOUNTER — Ambulatory Visit (INDEPENDENT_AMBULATORY_CARE_PROVIDER_SITE_OTHER): Payer: BLUE CROSS/BLUE SHIELD | Admitting: Medical

## 2017-06-14 ENCOUNTER — Encounter: Payer: Self-pay | Admitting: Medical

## 2017-06-14 VITALS — BP 126/82 | HR 82 | Ht 68.5 in | Wt 192.4 lb

## 2017-06-14 DIAGNOSIS — Z1211 Encounter for screening for malignant neoplasm of colon: Secondary | ICD-10-CM | POA: Diagnosis not present

## 2017-06-14 DIAGNOSIS — R21 Rash and other nonspecific skin eruption: Secondary | ICD-10-CM

## 2017-06-14 DIAGNOSIS — Z7185 Encounter for immunization safety counseling: Secondary | ICD-10-CM | POA: Insufficient documentation

## 2017-06-14 DIAGNOSIS — Z2821 Immunization not carried out because of patient refusal: Secondary | ICD-10-CM

## 2017-06-14 DIAGNOSIS — Z7189 Other specified counseling: Secondary | ICD-10-CM | POA: Diagnosis not present

## 2017-06-14 DIAGNOSIS — Z113 Encounter for screening for infections with a predominantly sexual mode of transmission: Secondary | ICD-10-CM | POA: Diagnosis not present

## 2017-06-14 DIAGNOSIS — Z1159 Encounter for screening for other viral diseases: Secondary | ICD-10-CM | POA: Diagnosis not present

## 2017-06-14 DIAGNOSIS — Z125 Encounter for screening for malignant neoplasm of prostate: Secondary | ICD-10-CM

## 2017-06-14 DIAGNOSIS — Z0000 Encounter for general adult medical examination without abnormal findings: Secondary | ICD-10-CM

## 2017-06-14 DIAGNOSIS — M1A9XX0 Chronic gout, unspecified, without tophus (tophi): Secondary | ICD-10-CM

## 2017-06-14 DIAGNOSIS — L84 Corns and callosities: Secondary | ICD-10-CM

## 2017-06-14 LAB — POCT URINALYSIS DIP (PROADVANTAGE DEVICE)
Bilirubin, UA: NEGATIVE
Blood, UA: NEGATIVE
Glucose, UA: NEGATIVE mg/dL
Ketones, POC UA: NEGATIVE mg/dL
Leukocytes, UA: NEGATIVE
Nitrite, UA: NEGATIVE
Protein Ur, POC: NEGATIVE mg/dL
Specific Gravity, Urine: 1.015
Urobilinogen, Ur: NEGATIVE
pH, UA: 6 (ref 5.0–8.0)

## 2017-06-14 MED ORDER — CLOTRIMAZOLE-BETAMETHASONE 1-0.05 % EX CREA: 1.0000 "application " | TOPICAL_CREAM | Freq: Two times a day (BID) | CUTANEOUS | 1 refills | Status: DC

## 2017-06-14 MED ORDER — NAPROXEN 500 MG PO TABS: 500.0000 mg | ORAL_TABLET | Freq: Two times a day (BID) | ORAL | 0 refills | Status: DC

## 2017-06-14 MED ORDER — ALLOPURINOL 100 MG PO TABS: 100.0000 mg | ORAL_TABLET | Freq: Every day | ORAL | 2 refills | Status: DC

## 2017-06-14 NOTE — Patient Instructions (Signed)
Recommendations: See your eye doctor yearly for routine vision care.  See your dentist yearly for routine dental care including hygiene visits twice yearly.  We are referring you to gastroenterology for your first colonoscopy cancer screen  Vaccines  I recommend you have a shingles vaccine to help prevent shingles or herpes zoster outbreak.   Please call your insurer to inquire about coverage for the Shingrix vaccine given in 2 doses.   Some insurers cover this vaccine after age 43, some cover this after age 63.  If your insurer covers this, then call to schedule appointment to have this vaccine here.  Gout  Begin Allopurinol  daily to help prevent gout  For flare ups, use Naprosyn  twice daily for  5-7 days  Drink tart cherry juice when you have a flare up  Rash of leg  Begin Lotrisone cream to the lower leg rash for up to 1.5 weeks at a time  Callus/bunion  Your right great toe has bunion deformity pushing that toe into the 2nd toe.    unfortunately the second toe has a corn that is ulcerated  Use a bunion splint over the counter or gauze in between the great toe and second toe to help take pressure off the ulcer  If agreeable, we can send you to specialist to discussed treatment for the bunion  FMLA  I want to get your gout under better control to help avoid having to miss work You need to be on a preventative medication to reduce the risk of gout flares, thus being allopurinol I will need to see you back in a month for recheck on gout and the allopurinol

## 2017-06-14 NOTE — Progress Notes (Signed)
Subjective:   HPI  Alexander [name] is a 56 y.o. male who presents for physical Chief Complaint  Patient presents with  . Gout    gout and flma ,physical     Medical care team includes: Ronette Hank, Kermit Balo, PA-C here for primary care Dentist Eye doctor  Concerns: Frequent gout flare up, misses work due to gout, pains in feet.  Couldn't afford medications prescribed earlier in the year  He note rash on right lower leg that comes and goes.  Flared up currently.  Saw dermatology in the past for this, labeled as fungus  Dealing with corn of right 2nd toe  Reviewed their medical, surgical, family, social, medication, and allergy history and updated chart as appropriate.  Past Medical History:  Diagnosis Date  . Allergy   . Arthritis   . Diverticulitis 2012  . Gout 2007  . History of gastroesophageal reflux (GERD)   . Overweight (BMI 25.0-29.9)     Past Surgical History:  Procedure Laterality Date  . COLONOSCOPY  06/2017   pending  . hemorrhoid      Social History   Social History  . Marital status: Single    Spouse name: N/A  . Number of children: N/A  . Years of education: N/A   Occupational History  . assembly worker  [employer]   Social History Main Topics  . Smoking status: Former Smoker    Packs/day: 0.00  . Smokeless tobacco: Never Used     Comment: quit 1990's  . Alcohol use 1.8 oz/week    3 Cans of beer per week     Comment: occassional  . Drug use: No  . Sexual activity: Yes   Other Topics Concern  . Not on file   Social History Narrative   Lives with friend, has grown children, Writer at [employer].   06/2017    Family History  Problem Relation Age of Onset  . Hypertension Mother   . Gout Mother   . Heart disease Mother        died of MI  . Stroke Mother   . Hypertension Father   . Cancer Father        died of brain tumor  . Hypertension Sister   . Hypertension Brother   . Diabetes Maternal Aunt   . Diabetes  Paternal Aunt   . Hypertension Brother   . Hypertension Brother   . Diabetes Brother   . Hypertension Sister   . Asthma Sister      Current Outpatient Prescriptions:  .  allopurinol (ZYLOPRIM) 100 MG tablet, Take 1 tablet (100 mg total) by mouth daily., Disp: 30 tablet, Rfl: 2 .  clotrimazole-betamethasone (LOTRISONE) cream, Apply 1 application topically 2 (two) times daily., Disp: 30 g, Rfl: 1 .  naproxen (NAPROSYN) 500 MG tablet, Take 1 tablet (500 mg total) by mouth 2 (two) times daily with a meal., Disp: 30 tablet, Rfl: 0  No Known Allergies   Review of Systems Constitutional: -fever, -chills, -sweats, -unexpected weight change, -decreased appetite, -fatigue Allergy: -sneezing, -itching, -congestion Dermatology: -changing moles, +rash, -lumps, +corn ENT: -runny nose, -ear pain, -sore throat, -hoarseness, -sinus pain, -teeth pain, - ringing in ears, -hearing loss, -nosebleeds Cardiology: -chest pain, -palpitations, -swelling, -difficulty breathing when lying flat, -waking up short of breath Respiratory: -cough, -shortness of breath, -difficulty breathing with exercise or exertion, -wheezing, -coughing up blood Gastroenterology: -abdominal pain, -nausea, -vomiting, -diarrhea, -constipation, -blood in stool, -changes in bowel movement, -difficulty swallowing or  eating Hematology: -bleeding, -bruising  Musculoskeletal: +joint aches, -muscle aches, +joint swelling, +back pain, -neck pain, -cramping, -changes in gait Ophthalmology: denies vision changes, eye redness, itching, discharge Urology: -burning with urination, -difficulty urinating, -blood in urine, -urinary frequency, -urgency, -incontinence Neurology: -headache, -weakness, -tingling, -numbness, -memory loss, -falls, -dizziness Psychology: -depressed mood, -agitation, -sleep problems     Objective:   BP 126/82   Pulse 82   Ht 5' 8.5" (1.74 m)   Wt 192 lb 6.4 oz (87.3 kg)   SpO2 99%   BMI 28.83 kg/m   General  appearance: alert, no distress, WD/WN, African American male Skin: several patches of erythremia and cleared center along anterior and lateral right ankle HEENT: normocephalic, conjunctiva/corneas normal, sclerae anicteric, PERRLA, EOMi, nares patent, no discharge or erythema, pharynx normal Oral cavity: MMM, tongue normal, teeth in good repair Neck: supple, no lymphadenopathy, no thyromegaly, no masses, normal ROM, no bruits Chest: non tender, normal shape and expansion Heart: RRR, normal S1, S2, no murmurs Lungs: CTA bilaterally, no wheezes, rhonchi, or rales Abdomen: +bs, soft, small umbilical reducible hernia, non tender, non distended, no masses, no hepatomegaly, no splenomegaly, no bruits Back: non tender, normal ROM, no scoliosis Musculoskeletal: flat feet bilat, bilat bunions, worse right, upper extremities non tender, no obvious deformity, normal ROM throughout, lower extremities non tender, no obvious deformity, normal ROM throughout Extremities: no edema, no cyanosis, no clubbing Pulses: 1+ pedal pulses otherwise pulses 2+ symmetric, upper extremities, normal cap refill Neurological: alert, oriented x 3, CN2-12 intact, strength normal upper extremities and lower extremities, sensation normal throughout, DTRs 2+ throughout, no cerebellar signs, gait normal Psychiatric: normal affect, behavior normal, pleasant  GU: normal male external genitalia,uncircumcised, nontender, no masses, no hernia, no lymphadenopathy Rectal: anus normal tone, prostate mild to moderate enlargement   Assessment and Plan :    Encounter Diagnoses  Name Primary?  . Routine general medical examination at a health care facility Yes  . Screen for colon cancer   . Screening for prostate cancer   . Rash   . Chronic gout without tophus, unspecified cause, unspecified site   . Callus of foot   . Screen for STD (sexually transmitted disease)   . Need for hepatitis C screening test   . Vaccine counseling   .  Influenza vaccination declined by patient     Physical exam - discussed and counseled on healthy lifestyle, diet, exercise, preventative care, vaccinations, sick and well care, proper use of emergency dept and after hours care, and addressed their concerns.    Health screening: See your eye doctor yearly for routine vision care. See your dentist yearly for routine dental care including hygiene visits twice yearly.  Discussed STD testing, discussed prevention, condom use, means of transmission  Cancer screening Discussed colonoscopy screening,referred for 1st colonoscopy Discussed PSA, prostate exam, and prostate cancer screening risks/benefits.   Discussed prostate symptoms as well.  Prostate screening performed: Yes  Vaccinations: He declines flu vaccine He is up to date on Td I recommend you have a shingles vaccine to help prevent shingles or herpes zoster outbreak.   Please call your insurer to inquire about coverage for the Shingrix vaccine given in 2 doses.   Some insurers cover this vaccine after age 25, some cover this after age 60.  If your insurer covers this, then call to schedule appointment to have this vaccine here.   Patient Instructions  Recommendations: See your eye doctor yearly for routine vision care.  See your dentist yearly for  routine dental care including hygiene visits twice yearly.  We are referring you to gastroenterology for your first colonoscopy cancer screen  Vaccines  I recommend you have a shingles vaccine to help prevent shingles or herpes zoster outbreak.   Please call your insurer to inquire about coverage for the Shingrix vaccine given in 2 doses.   Some insurers cover this vaccine after age 45, some cover this after age 81.  If your insurer covers this, then call to schedule appointment to have this vaccine here.  Gout  Begin Allopurinol  daily to help prevent gout  For flare ups, use Naprosyn  twice daily for  5-7 days  Drink tart  cherry juice when you have a flare up  Rash of leg  Begin Lotrisone cream to the lower leg rash for up to 1.5 weeks at a time  Callus/bunion  Your right great toe has bunion deformity pushing that toe into the 2nd toe.    unfortunately the second toe has a corn that is ulcerated  Use a bunion splint over the counter or gauze in between the great toe and second toe to help take pressure off the ulcer  If agreeable, we can send you to specialist to discussed treatment for the bunion  FMLA  I want to get your gout under better control to help avoid having to miss work You need to be on a preventative medication to reduce the risk of gout flares, thus being allopurinol I will need to see you back in a month for recheck on gout and the allopurinol    Follow-up pending labs, yearly for physical

## 2017-06-15 LAB — LIPID PANEL
Cholesterol: 150 mg/dL (ref <200)
HDL: 77 mg/dL (ref >40)
LDL Cholesterol (Calc): 56 mg/dL (calc)
Non-HDL Cholesterol (Calc): 73 mg/dL (calc) (ref <130)
Total CHOL/HDL Ratio: 1.9 (calc) (ref <5.0)
Triglycerides: 88 mg/dL (ref <150)

## 2017-06-15 LAB — HEMOGLOBIN A1C
Hgb A1c MFr Bld: 5.2 % of total Hgb (ref <5.7)
Mean Plasma Glucose: 103 (calc)
eAG (mmol/L): 5.7 (calc)

## 2017-06-15 LAB — CBC WITH DIFFERENTIAL/PLATELET
Basophils Absolute: 21 cells/uL (ref 0–200)
Basophils Relative: 0.5 %
Eosinophils Absolute: 254 cells/uL (ref 15–500)
Eosinophils Relative: 6.2 %
HCT: 40.6 % (ref 38.5–50.0)
Hemoglobin: 14.1 g/dL (ref 13.2–17.1)
Lymphs Abs: 1312 cells/uL (ref 850–3900)
MCH: 34.4 pg — ABNORMAL HIGH (ref 27.0–33.0)
MCHC: 34.7 g/dL (ref 32.0–36.0)
MCV: 99 fL (ref 80.0–100.0)
MPV: 9.2 fL (ref 7.5–12.5)
Monocytes Relative: 13.1 %
Neutro Abs: 1976 cells/uL (ref 1500–7800)
Neutrophils Relative %: 48.2 %
Platelets: 232 10*3/uL (ref 140–400)
RBC: 4.1 10*6/uL — ABNORMAL LOW (ref 4.20–5.80)
RDW: 12.9 % (ref 11.0–15.0)
Total Lymphocyte: 32 %
WBC mixed population: 537 cells/uL (ref 200–950)
WBC: 4.1 10*3/uL (ref 3.8–10.8)

## 2017-06-15 LAB — COMPREHENSIVE METABOLIC PANEL
AG Ratio: 1.3 (calc) (ref 1.0–2.5)
ALT: 36 U/L (ref 9–46)
AST: 31 U/L (ref 10–35)
Albumin: 4 g/dL (ref 3.6–5.1)
Alkaline phosphatase (APISO): 52 U/L (ref 40–115)
BUN: 11 mg/dL (ref 7–25)
CO2: 25 mmol/L (ref 20–32)
Calcium: 9.1 mg/dL (ref 8.6–10.3)
Chloride: 105 mmol/L (ref 98–110)
Creat: 0.98 mg/dL (ref 0.70–1.33)
Globulin: 3 g/dL (calc) (ref 1.9–3.7)
Glucose, Bld: 87 mg/dL (ref 65–99)
Potassium: 3.7 mmol/L (ref 3.5–5.3)
Sodium: 139 mmol/L (ref 135–146)
Total Bilirubin: 0.4 mg/dL (ref 0.2–1.2)
Total Protein: 7 g/dL (ref 6.1–8.1)

## 2017-06-15 LAB — PSA: PSA: 1.2 ng/mL (ref <=4.0)

## 2017-06-15 LAB — URIC ACID: Uric Acid, Serum: 8.3 mg/dL — ABNORMAL HIGH (ref 4.0–8.0)

## 2017-06-15 LAB — HEPATITIS C ANTIBODY
Hepatitis C Ab: NONREACTIVE
SIGNAL TO CUT-OFF: 0.02 (ref <1.00)

## 2017-06-15 LAB — RPR: RPR Ser Ql: NONREACTIVE

## 2017-06-15 LAB — C. TRACHOMATIS/N. GONORRHOEAE RNA
C. trachomatis RNA, TMA: NOT DETECTED
N. gonorrhoeae RNA, TMA: NOT DETECTED

## 2017-06-15 LAB — HIV ANTIBODY (ROUTINE TESTING W REFLEX): HIV 1&2 Ab, 4th Generation: NONREACTIVE

## 2017-06-17 ENCOUNTER — Telehealth: Payer: Self-pay | Admitting: Internal Medicine

## 2017-06-17 NOTE — Telephone Encounter (Signed)
Pt left a voicemail that he was suppose to get a work note for when he was here Friday. Please verify if you took him out of work or a note verifying he was here.   Fax # [phone] attention Marcial Pacas

## 2017-06-17 NOTE — Telephone Encounter (Signed)
Note for Friday only for gout flare, and FMLA was turned in Friday

## 2017-06-21 ENCOUNTER — Telehealth: Payer: Self-pay | Admitting: Medical

## 2017-06-21 NOTE — Telephone Encounter (Signed)
I changed to 1-3 days per month.   The goal is to get a better control over gout to where he doesn't miss.   The next time I do FMLA we will not be able to give >2 days per month.  If we can't get gout under better control within next 2 months then we may need to get specialist involved. For now, c/t same medications from last phone call back and recheck in 48mo

## 2017-06-21 NOTE — Telephone Encounter (Signed)
Please call re FMLA  Forms  Been waiting on response from previous call

## 2017-06-24 NOTE — Telephone Encounter (Signed)
Pt informed FMLA forms have been faxed

## 2017-06-24 NOTE — Telephone Encounter (Signed)
Called and l/m notifying  the patient of this

## 2017-08-07 ENCOUNTER — Telehealth: Payer: Self-pay

## 2017-08-07 ENCOUNTER — Other Ambulatory Visit: Payer: Self-pay

## 2017-08-07 MED ORDER — ALLOPURINOL 100 MG PO TABS: 100.0000 mg | ORAL_TABLET | Freq: Every day | ORAL | 2 refills | Status: DC

## 2017-08-07 NOTE — Telephone Encounter (Signed)
Done

## 2017-08-07 NOTE — Telephone Encounter (Signed)
Faxed request from [facility] for refill of allopurinol 100mg . Trixie Rude/RLB

## 2017-08-16 ENCOUNTER — Other Ambulatory Visit: Payer: Self-pay

## 2017-08-16 ENCOUNTER — Telehealth: Payer: Self-pay | Admitting: Medical

## 2017-08-16 MED ORDER — ALLOPURINOL 100 MG PO TABS: 100.0000 mg | ORAL_TABLET | Freq: Every day | ORAL | 2 refills | Status: DC

## 2017-08-16 NOTE — Telephone Encounter (Signed)
Rcvd request from [facility] [address] requesting to move existing Al[facility]inol [facility]ill [phone]

## 2017-08-16 NOTE — Telephone Encounter (Signed)
Sent to pharmacy 

## 2017-09-03 ENCOUNTER — Emergency Department (HOSPITAL_COMMUNITY)
Admission: EM | Admit: 2017-09-03 | Discharge: 2017-09-03 | Disposition: A | Discharge status: Home / Self Care | Payer: BLUE CROSS/BLUE SHIELD | Attending: Emergency Medicine | Admitting: Emergency Medicine

## 2017-09-03 ENCOUNTER — Encounter (HOSPITAL_COMMUNITY): Payer: Self-pay | Admitting: Emergency Medicine

## 2017-09-03 ENCOUNTER — Other Ambulatory Visit: Payer: Self-pay

## 2017-09-03 DIAGNOSIS — Z87891 Personal history of nicotine dependence: Secondary | ICD-10-CM | POA: Diagnosis not present

## 2017-09-03 DIAGNOSIS — Z79899 Other long term (current) drug therapy: Secondary | ICD-10-CM | POA: Diagnosis not present

## 2017-09-03 DIAGNOSIS — R2231 Localized swelling, mass and lump, right upper limb: Secondary | ICD-10-CM | POA: Diagnosis present

## 2017-09-03 DIAGNOSIS — L03113 Cellulitis of right upper limb: Secondary | ICD-10-CM | POA: Insufficient documentation

## 2017-09-03 MED ORDER — LIDOCAINE HCL 2 % IJ SOLN
10.0000 mL | Freq: Once | INTRAMUSCULAR | Status: AC
  Administered 2017-09-03: 200 mg via INTRADERMAL
  Filled 2017-09-03: qty 10

## 2017-09-03 MED ORDER — SULFAMETHOXAZOLE-TRIMETHOPRIM 800-160 MG PO TABS: 1.0000 | ORAL_TABLET | Freq: Two times a day (BID) | ORAL | 0 refills | Status: AC

## 2017-09-03 MED ORDER — SULFAMETHOXAZOLE-TRIMETHOPRIM 800-160 MG PO TABS
1.0000 | ORAL_TABLET | Freq: Once | ORAL | Status: AC
  Administered 2017-09-03: 1 via ORAL
  Filled 2017-09-03: qty 1

## 2017-09-03 MED ORDER — ACETAMINOPHEN 500 MG PO TABS
1000.0000 mg | ORAL_TABLET | Freq: Once | ORAL | Status: AC
  Administered 2017-09-03: 1000 mg via ORAL
  Filled 2017-09-03: qty 2

## 2017-09-03 NOTE — ED Triage Notes (Signed)
Swelling to R hand, especially around index finger. Pt states he got bit by something 3 days ago.

## 2017-09-03 NOTE — Discharge Instructions (Addendum)
Please take all of your antibiotics until finished!   You may develop abdominal discomfort or diarrhea from the antibiotic.  You may help offset this with probiotics which you can buy or get in yogurt. Do not eat  or take the probiotics until 2 hours after your antibiotic.    Keep the area clean and dry.  Apply antibiotic ointment twice daily.  Return to the ED in 2 days for wound recheck.  Return sooner if there is worsening redness and spread of redness up the arm, weakness, or fevers.  Follow-up with hand surgery for reevaluation.

## 2017-09-03 NOTE — ED Provider Notes (Signed)
[facility]-EMERGENCY DEPT Provider Note   CSN: [id number] Arrival date & time: 09/03/17  4098     History   Chief Complaint Chief Complaint  Patient presents with  . Hand swelling    HPI Alexander [name] is a 56 y.o. male with history of gout, GERD, allergy, arthritis who presents today with chief complaint acute onset, progressively worsening right hand pain and swelling for 3 days.  He states he thinks he may have been bitten by an insect but does not recall known insect bite.  He states that over the past 3 days he has been having progressively worsening erythema and swelling to the dorsum of the right hand extending from the proximal phalanx of the second digit to the wrist.  He endorses slightly decreased range of motion secondary to swelling.  He endorses throbbing aching pain which radiates up to the forearm.  Denies numbness, tingling, or weakness.  Denies fevers or chills.  States this feels different from his gout flares.  His tetanus is up-to-date.  No one at home has similar symptoms.  The history is provided by the patient.    Past Medical History:  Diagnosis Date  . Allergy   . Arthritis   . Diverticulitis 2012  . Gout 2007  . History of gastroesophageal reflux (GERD)   . Overweight (BMI 25.0-29.9)     Patient Active Problem List   Diagnosis Date Noted  . Screen for STD (sexually transmitted disease) 06/14/2017  . Need for hepatitis C screening test 06/14/2017  . Rash 06/14/2017  . Vaccine counseling 06/14/2017  . Influenza vaccination declined by patient 06/14/2017  . Chronic pain of right ankle 12/12/2016  . Tinea pedis of right foot 12/12/2016  . Callus of foot 12/12/2016  . Ankle and foot deformity, acquired, right 12/12/2016  . Dermatitis 11/02/2012  . Chronic foot pain, right 11/02/2012  . Elevated blood pressure 11/02/2012  . Diverticulitis of colon with perforation 09/21/2011  . Gout 09/21/2011  . GERD (gastroesophageal reflux  disease) 09/21/2011  . BMI (body mass index) 20.0-29.9 09/21/2011    Past Surgical History:  Procedure Laterality Date  . COLONOSCOPY  06/2017   pending  . hemorrhoid         Home Medications    Prior to Admission medications   Medication Sig Start Date End Date Taking? Authorizing Provider  allopurinol (ZYLOPRIM) 100 MG tablet Take 1 tablet (100 mg total) daily by mouth. 08/16/17   Tysinger, Kermit Balo, PA-C  clotrimazole-betamethasone (LOTRISONE) cream Apply 1 application topically 2 (two) times daily. 06/14/17   Tysinger, Kermit Balo, PA-C  naproxen (NAPROSYN) 500 MG tablet Take 1 tablet (500 mg total) by mouth 2 (two) times daily with a meal. 06/14/17   Tysinger, Kermit Balo, PA-C  sulfamethoxazole-trimethoprim (BACTRIM DS,SEPTRA DS) 800-160 MG tablet Take 1 tablet by mouth 2 (two) times daily for 10 days. 09/03/17 09/13/17  Jeanie Sewer, PA-C    Family History Family History  Problem Relation Age of Onset  . Hypertension Mother   . Gout Mother   . Heart disease Mother        died of MI  . Stroke Mother   . Hypertension Father   . Cancer Father        died of brain tumor  . Hypertension Sister   . Hypertension Brother   . Diabetes Maternal Aunt   . Diabetes Paternal Aunt   . Hypertension Brother   . Hypertension Brother   . Diabetes Brother   .  Hypertension Sister   . Asthma Sister     Social History Social History   Tobacco Use  . Smoking status: Former Smoker    Packs/day: 0.00  . Smokeless tobacco: Never Used  . Tobacco comment: quit 1990's  Substance Use Topics  . Alcohol use: Yes    Alcohol/week: 1.8 oz    Types: 3 Cans of beer per week    Comment: occassional  . Drug use: No     Allergies   Patient has no known allergies.   Review of Systems Review of Systems  Constitutional: Negative for chills and fever.  Musculoskeletal: Positive for arthralgias (R hand).  Skin: Positive for color change.  Neurological: Negative for weakness and numbness.      Physical Exam Updated Vital Signs BP (!) 135/91 (BP Location: Left Arm)   Pulse 94   Temp 98.4 F (36.9 C) (Oral)   Resp 18   Ht 5\' 9"  (1.753 m)   Wt 86.2 kg (190 lb)   SpO2 98%   BMI 28.06 kg/m   Physical Exam  Constitutional: He appears well-developed and well-nourished. No distress.  HENT:  Head: Normocephalic and atraumatic.  Eyes: Conjunctivae are normal. Right eye exhibits no discharge. Left eye exhibits no discharge.  Neck: Normal range of motion. Neck supple. No JVD present. No tracheal deviation present.  Cardiovascular: Normal rate and intact distal pulses.  2+ radial pulses bl  Pulmonary/Chest: Effort normal.  Abdominal: He exhibits no distension.  Musculoskeletal: He exhibits tenderness. He exhibits no edema.  Erythema and swelling noted to the dorsum of the hand, notably from the proximal phalanx of the first digit to the wrist on the radial side.  There is a pinpoint area that has come to ahead with what appears to be purulent material just under the skin along the radial aspect of the proximal second phalanx.  Mild tenderness to palpation diffusely with no focal tenderness.  5/5 strength of wrist and digits with flexion and extension against resistance.  Some difficulty with flexion at the second MCP joint secondary to swelling.  No crepitus or deformity noted.  There is streaking up the forearm noted.  Neurological: He is alert. No sensory deficit.  Skin: Skin is warm and dry. There is erythema.  As described in the musculoskeletal section  Psychiatric: He has a normal mood and affect. His behavior is normal.  Nursing note and vitals reviewed.    ED Treatments / Results  Labs (all labs ordered are listed, but only abnormal results are displayed) Labs Reviewed - No data to display  EKG  EKG Interpretation None       Radiology No results found.  Procedures .Marland Kitchen.Incision and Drainage Date/Time: 09/03/2017 9:41 AM Performed by: Jeanie SewerFawze, Calvert Charland A,  PA-C Authorized by: Jeanie SewerFawze, Clennon Nasca A, PA-C   Consent:    Consent obtained:  Verbal   Consent given by:  Patient   Risks discussed:  Bleeding, pain and incomplete drainage   Alternatives discussed:  No treatment Location:    Type:  Abscess   Size:  2mm   Location:  Upper extremity   Upper extremity location:  Finger   Finger location:  R index finger Pre-procedure details:    Skin preparation:  Betadine Anesthesia (see MAR for exact dosages):    Anesthesia method:  Local infiltration   Local anesthetic:  Lidocaine 2% w/o epi Procedure type:    Complexity:  Simple Procedure details:    Needle aspiration: no     Incision types:  Stab incision   Incision depth:  Dermal   Scalpel blade:  11   Wound management:  Irrigated with saline and probed and deloculated   Drainage:  Bloody and purulent   Drainage amount:  Scant   Wound treatment:  Wound left open   Packing materials:  None Post-procedure details:    Patient tolerance of procedure:  Tolerated well, no immediate complications    (including critical care time)  Medications Ordered in ED Medications  lidocaine (XYLOCAINE) 2 % (with pres) injection 200 mg (not administered)  sulfamethoxazole-trimethoprim (BACTRIM DS,SEPTRA DS) 800-160 MG per tablet 1 tablet (not administered)  acetaminophen (TYLENOL) tablet 1,000 mg (not administered)     Initial Impression / Assessment and Plan / ED Course  I have reviewed the triage vital signs and the nursing notes.  Pertinent labs & imaging results that were available during my care of the patient were reviewed by me and considered in my medical decision making (see chart for details).     Patient with swelling and erythema to the dorsum of the right hand consistent with cellulitis secondary to possible insect bite of the right second digit.  Tetanus is up-to-date.  He is neurovascularly intact.   Examination is not concerning for flexor tenosynovitis or felon. Low suspicion of gout  or septic joint. No history or trauma. I&D performed with scant purulent drainage.  No drain placed.  First dose of Bactrim given in the ED.  Will discharge with 10-day course of Bactrim and wound check in 2 days.  Cellulitic area marked and patient instructed to return to the ED sooner if he has worsening spread of redness, fevers, or other concerning symptoms.  He will follow-up with hand surgery for reevaluation. Pt verbalized understanding of and agreement with plan and is safe for discharge home at this time.  No complaints prior to discharge.  Final Clinical Impressions(s) / ED Diagnoses   Final diagnoses:  Cellulitis of right hand    ED Discharge Orders        Ordered    sulfamethoxazole-trimethoprim (BACTRIM DS,SEPTRA DS) 800-160 MG tablet  2 times daily     09/03/17 0937       Jeanie SewerFawze, Vietta Bonifield A, PA-C 09/03/17 [id number]    Jacalyn LefevreHaviland, Julie, MD 09/03/17 1012

## 2017-09-04 ENCOUNTER — Encounter: Payer: Self-pay | Admitting: Medical

## 2017-10-01 DIAGNOSIS — I2699 Other pulmonary embolism without acute cor pulmonale: Secondary | ICD-10-CM

## 2017-10-01 DIAGNOSIS — I82409 Acute embolism and thrombosis of unspecified deep veins of unspecified lower extremity: Secondary | ICD-10-CM

## 2017-10-01 HISTORY — DX: Acute embolism and thrombosis of unspecified deep veins of unspecified lower extremity: I82.409

## 2017-10-01 HISTORY — DX: Other pulmonary embolism without acute cor pulmonale: I26.99

## 2018-02-09 ENCOUNTER — Emergency Department (HOSPITAL_COMMUNITY)
Admission: EM | Admit: 2018-02-09 | Discharge: 2018-02-09 | Disposition: A | Discharge status: Home / Self Care | Payer: BLUE CROSS/BLUE SHIELD | Attending: Emergency Medicine | Admitting: Emergency Medicine

## 2018-02-09 ENCOUNTER — Emergency Department (HOSPITAL_COMMUNITY): Payer: BLUE CROSS/BLUE SHIELD

## 2018-02-09 ENCOUNTER — Encounter (HOSPITAL_COMMUNITY): Payer: Self-pay | Admitting: Emergency Medicine

## 2018-02-09 DIAGNOSIS — Y929 Unspecified place or not applicable: Secondary | ICD-10-CM | POA: Diagnosis not present

## 2018-02-09 DIAGNOSIS — Y999 Unspecified external cause status: Secondary | ICD-10-CM | POA: Insufficient documentation

## 2018-02-09 DIAGNOSIS — Z041 Encounter for examination and observation following transport accident: Secondary | ICD-10-CM | POA: Diagnosis present

## 2018-02-09 DIAGNOSIS — R0789 Other chest pain: Secondary | ICD-10-CM | POA: Insufficient documentation

## 2018-02-09 DIAGNOSIS — Z79899 Other long term (current) drug therapy: Secondary | ICD-10-CM | POA: Diagnosis not present

## 2018-02-09 DIAGNOSIS — Z87891 Personal history of nicotine dependence: Secondary | ICD-10-CM | POA: Diagnosis not present

## 2018-02-09 DIAGNOSIS — S2231XA Fracture of one rib, right side, initial encounter for closed fracture: Secondary | ICD-10-CM | POA: Insufficient documentation

## 2018-02-09 DIAGNOSIS — M25572 Pain in left ankle and joints of left foot: Secondary | ICD-10-CM | POA: Diagnosis not present

## 2018-02-09 DIAGNOSIS — Y939 Activity, unspecified: Secondary | ICD-10-CM | POA: Diagnosis not present

## 2018-02-09 DIAGNOSIS — M79651 Pain in right thigh: Secondary | ICD-10-CM | POA: Diagnosis not present

## 2018-02-09 MED ORDER — IBUPROFEN 200 MG PO TABS
400.0000 mg | ORAL_TABLET | Freq: Once | ORAL | Status: AC | PRN
  Administered 2018-02-09: 400 mg via ORAL
  Filled 2018-02-09: qty 2

## 2018-02-09 NOTE — ED Notes (Signed)
Bed: WA03 Expected date:  Expected time:  Means of arrival:  Comments: 

## 2018-02-09 NOTE — ED Notes (Signed)
Bed: WTR7 Expected date:  Expected time:  Means of arrival:  Comments: 
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
## 2018-02-09 NOTE — Discharge Instructions (Addendum)
Use the incentive spirometer every 15 minutes while awake.  Take Advil for pain.  See your primary care physician if having significant pain by Wednesday, 02/12/2018 or if you feel that you are unable to return to work full duty.  Your blood pressure should be rechecked within the next 3 weeks.  Today's was mildly elevated at 143/95. Ankle x-rays showed nothing broken.

## 2018-02-17 ENCOUNTER — Ambulatory Visit (INDEPENDENT_AMBULATORY_CARE_PROVIDER_SITE_OTHER): Payer: BLUE CROSS/BLUE SHIELD | Admitting: Medical

## 2018-02-17 ENCOUNTER — Encounter: Payer: Self-pay | Admitting: Medical

## 2018-02-17 DIAGNOSIS — S7012XA Contusion of left thigh, initial encounter: Secondary | ICD-10-CM

## 2018-02-17 DIAGNOSIS — S20211A Contusion of right front wall of thorax, initial encounter: Secondary | ICD-10-CM | POA: Diagnosis not present

## 2018-02-17 DIAGNOSIS — M25562 Pain in left knee: Secondary | ICD-10-CM | POA: Diagnosis not present

## 2018-02-17 DIAGNOSIS — E790 Hyperuricemia without signs of inflammatory arthritis and tophaceous disease: Secondary | ICD-10-CM

## 2018-02-17 DIAGNOSIS — Z1211 Encounter for screening for malignant neoplasm of colon: Secondary | ICD-10-CM | POA: Diagnosis not present

## 2018-02-17 DIAGNOSIS — R030 Elevated blood-pressure reading, without diagnosis of hypertension: Secondary | ICD-10-CM | POA: Diagnosis not present

## 2018-02-17 DIAGNOSIS — M1A9XX0 Chronic gout, unspecified, without tophus (tophi): Secondary | ICD-10-CM

## 2018-02-17 DIAGNOSIS — M25561 Pain in right knee: Secondary | ICD-10-CM

## 2018-02-17 DIAGNOSIS — M79651 Pain in right thigh: Secondary | ICD-10-CM | POA: Diagnosis not present

## 2018-02-17 DIAGNOSIS — S2231XA Fracture of one rib, right side, initial encounter for closed fracture: Secondary | ICD-10-CM

## 2018-02-17 DIAGNOSIS — S7011XA Contusion of right thigh, initial encounter: Secondary | ICD-10-CM | POA: Diagnosis not present

## 2018-02-17 MED ORDER — ALLOPURINOL 100 MG PO TABS: 100.0000 mg | ORAL_TABLET | Freq: Every day | ORAL | 2 refills | Status: DC

## 2018-02-17 MED ORDER — NAPROXEN 500 MG PO TABS: 500.0000 mg | ORAL_TABLET | Freq: Two times a day (BID) | ORAL | 0 refills | Status: DC
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
## 2018-02-19 ENCOUNTER — Telehealth: Payer: Self-pay | Admitting: Medical

## 2018-02-19 NOTE — Telephone Encounter (Signed)
Called pt and left message with Shane's instruction °
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
## 2018-02-19 NOTE — Telephone Encounter (Signed)
Called pt and left message to call our office. 

## 2018-02-19 NOTE — Telephone Encounter (Signed)
Pt returned my call and states that he will need up to 3 days absences (including doctor appointments) approved per month due to his condition just like his last FMLA due to point system at his job. Also pt did not go to orthopedist. He said he has an appointment to see a Foot and Ankle doctor on 03/11/18.  The 2 separate follow up appointments have been schedule with Vincenza Hews.

## 2018-02-19 NOTE — Telephone Encounter (Signed)
I stand by my previous statement and for the reasons stated I can only write for 1 absence per month for condition and 1 absence per quarter for f/u appt.   If he wants to have podiatry weight in and do FMLA with them once he sees them that is fine.     I am not going to change the FMLA I wrote as he didn't follow through with recommendations, eval and treatment from the prior FMLA.  If someone is requiring FMLA, they have to work with [place] using the recommendations to help improve the condition.  If the podiatrist finds reason to allow more time out of work, then that is something they can discuss.  F/u with podiatry as planned.

## 2018-02-27 ENCOUNTER — Ambulatory Visit: Payer: BLUE CROSS/BLUE SHIELD | Admitting: Medical

## 2018-02-28 ENCOUNTER — Ambulatory Visit: Payer: BLUE CROSS/BLUE SHIELD | Admitting: Medical

## 2018-03-04 ENCOUNTER — Other Ambulatory Visit: Payer: Self-pay | Admitting: Medical

## 2018-03-04 NOTE — Telephone Encounter (Signed)
Is this ok to refill?  

## 2018-03-11 ENCOUNTER — Ambulatory Visit: Payer: BLUE CROSS/BLUE SHIELD | Admitting: Medical

## 2018-03-20 ENCOUNTER — Encounter: Payer: Self-pay | Admitting: Medical

## 2018-04-11 ENCOUNTER — Other Ambulatory Visit: Payer: Self-pay | Admitting: Medical

## 2018-04-11 NOTE — Telephone Encounter (Signed)
Is this ok to refill?  

## 2018-04-23 ENCOUNTER — Emergency Department (HOSPITAL_COMMUNITY): Payer: BLUE CROSS/BLUE SHIELD

## 2018-04-23 ENCOUNTER — Encounter (HOSPITAL_COMMUNITY): Payer: Self-pay | Admitting: Radiology

## 2018-04-23 ENCOUNTER — Inpatient Hospital Stay (HOSPITAL_COMMUNITY)
Admission: EM | Admit: 2018-04-23 | Discharge: 2018-04-28 | DRG: 175 | Disposition: A | Discharge status: Home / Self Care | Payer: BLUE CROSS/BLUE SHIELD | Attending: Internal Medicine | Admitting: Internal Medicine

## 2018-04-23 ENCOUNTER — Other Ambulatory Visit: Payer: Self-pay

## 2018-04-23 DIAGNOSIS — Z79899 Other long term (current) drug therapy: Secondary | ICD-10-CM | POA: Diagnosis not present

## 2018-04-23 DIAGNOSIS — R0602 Shortness of breath: Secondary | ICD-10-CM

## 2018-04-23 DIAGNOSIS — Z87891 Personal history of nicotine dependence: Secondary | ICD-10-CM

## 2018-04-23 DIAGNOSIS — R072 Precordial pain: Secondary | ICD-10-CM

## 2018-04-23 DIAGNOSIS — I4581 Long QT syndrome: Secondary | ICD-10-CM | POA: Diagnosis present

## 2018-04-23 DIAGNOSIS — I2699 Other pulmonary embolism without acute cor pulmonale: Secondary | ICD-10-CM | POA: Diagnosis not present

## 2018-04-23 DIAGNOSIS — K219 Gastro-esophageal reflux disease without esophagitis: Secondary | ICD-10-CM | POA: Diagnosis present

## 2018-04-23 DIAGNOSIS — M109 Gout, unspecified: Secondary | ICD-10-CM | POA: Diagnosis present

## 2018-04-23 DIAGNOSIS — I82441 Acute embolism and thrombosis of right tibial vein: Secondary | ICD-10-CM | POA: Diagnosis present

## 2018-04-23 DIAGNOSIS — J9691 Respiratory failure, unspecified with hypoxia: Secondary | ICD-10-CM | POA: Diagnosis present

## 2018-04-23 DIAGNOSIS — J9601 Acute respiratory failure with hypoxia: Secondary | ICD-10-CM | POA: Diagnosis present

## 2018-04-23 DIAGNOSIS — I361 Nonrheumatic tricuspid (valve) insufficiency: Secondary | ICD-10-CM | POA: Diagnosis not present

## 2018-04-23 DIAGNOSIS — I2609 Other pulmonary embolism with acute cor pulmonale: Secondary | ICD-10-CM | POA: Diagnosis present

## 2018-04-23 DIAGNOSIS — I82431 Acute embolism and thrombosis of right popliteal vein: Secondary | ICD-10-CM | POA: Diagnosis present

## 2018-04-23 DIAGNOSIS — M7989 Other specified soft tissue disorders: Secondary | ICD-10-CM | POA: Diagnosis not present

## 2018-04-23 LAB — COMPREHENSIVE METABOLIC PANEL
ALT: 29 U/L (ref 0–44)
AST: 35 U/L (ref 15–41)
Albumin: 3.6 g/dL (ref 3.5–5.0)
Alkaline Phosphatase: 96 U/L (ref 38–126)
Anion gap: 8 (ref 5–15)
BUN: 18 mg/dL (ref 6–20)
CO2: 27 mmol/L (ref 22–32)
Calcium: 9.2 mg/dL (ref 8.9–10.3)
Chloride: 107 mmol/L (ref 98–111)
Creatinine, Ser: 0.97 mg/dL (ref 0.61–1.24)
GFR calc Af Amer: >60 mL/min (ref >60)
GFR calc non Af Amer: >60 mL/min (ref >60)
Glucose, Bld: 84 mg/dL (ref 70–99)
Potassium: 4.3 mmol/L (ref 3.5–5.1)
Sodium: 142 mmol/L (ref 135–145)
Total Bilirubin: 0.4 mg/dL (ref 0.3–1.2)
Total Protein: 7.2 g/dL (ref 6.5–8.1)

## 2018-04-23 LAB — I-STAT TROPONIN, ED
Troponin i, poc: 0.03 ng/mL (ref 0.00–0.08)
Troponin i, poc: 0.04 ng/mL (ref 0.00–0.08)

## 2018-04-23 LAB — CBC WITH DIFFERENTIAL/PLATELET
Basophils Absolute: 0 10*3/uL (ref 0.0–0.1)
Basophils Relative: 0 %
Eosinophils Absolute: 0.1 10*3/uL (ref 0.0–0.7)
Eosinophils Relative: 2 %
HCT: 45.3 % (ref 39.0–52.0)
Hemoglobin: 15.3 g/dL (ref 13.0–17.0)
Lymphocytes Relative: 26 %
Lymphs Abs: 1.7 10*3/uL (ref 0.7–4.0)
MCH: 34.1 pg — ABNORMAL HIGH (ref 26.0–34.0)
MCHC: 33.8 g/dL (ref 30.0–36.0)
MCV: 100.9 fL — ABNORMAL HIGH (ref 78.0–100.0)
Monocytes Absolute: 0.6 10*3/uL (ref 0.1–1.0)
Monocytes Relative: 9 %
Neutro Abs: 4.1 10*3/uL (ref 1.7–7.7)
Neutrophils Relative %: 63 %
Platelets: 156 10*3/uL (ref 150–400)
RBC: 4.49 MIL/uL (ref 4.22–5.81)
RDW: 13.5 % (ref 11.5–15.5)
WBC: 6.5 10*3/uL (ref 4.0–10.5)

## 2018-04-23 LAB — MAGNESIUM: Magnesium: 2.3 mg/dL (ref 1.7–2.4)

## 2018-04-23 LAB — PROTIME-INR
INR: 0.95
Prothrombin Time: 12.5 seconds (ref 11.4–15.2)

## 2018-04-23 LAB — TROPONIN I: Troponin I: 0.04 ng/mL (ref <0.03)

## 2018-04-23 LAB — MRSA PCR SCREENING: MRSA by PCR: NEGATIVE

## 2018-04-23 LAB — D-DIMER, QUANTITATIVE: D-Dimer, Quant: 5.26 ug/mL-FEU — ABNORMAL HIGH (ref 0.00–0.50)

## 2018-04-23 MED ORDER — HEPARIN BOLUS VIA INFUSION
5000.0000 [IU] | Freq: Once | INTRAVENOUS | Status: AC
  Administered 2018-04-23: 5000 [IU] via INTRAVENOUS
  Filled 2018-04-23: qty 5000

## 2018-04-23 MED ORDER — ALLOPURINOL 100 MG PO TABS
100.0000 mg | ORAL_TABLET | Freq: Every day | ORAL | Status: DC
  Administered 2018-04-23 – 2018-04-28 (×6): 100 mg via ORAL
  Filled 2018-04-23 (×6): qty 1

## 2018-04-23 MED ORDER — NITROGLYCERIN 0.4 MG SL SUBL
0.4000 mg | SUBLINGUAL_TABLET | SUBLINGUAL | Status: DC | PRN
  Administered 2018-04-23 (×2): 0.4 mg via SUBLINGUAL
  Filled 2018-04-23: qty 1

## 2018-04-23 MED ORDER — ACETAMINOPHEN 650 MG RE SUPP: 650.0000 mg | Freq: Four times a day (QID) | RECTAL | Status: DC | PRN

## 2018-04-23 MED ORDER — ALBUTEROL SULFATE (2.5 MG/3ML) 0.083% IN NEBU: 5.0000 mg | INHALATION_SOLUTION | Freq: Once | RESPIRATORY_TRACT | Status: DC

## 2018-04-23 MED ORDER — SODIUM CHLORIDE 0.9 % IV SOLN
INTRAVENOUS | Status: AC
  Administered 2018-04-23: 21:00:00 via INTRAVENOUS

## 2018-04-23 MED ORDER — PROMETHAZINE HCL 25 MG PO TABS: 12.5000 mg | ORAL_TABLET | Freq: Four times a day (QID) | ORAL | Status: DC | PRN

## 2018-04-23 MED ORDER — POLYETHYLENE GLYCOL 3350 17 G PO PACK: 17.0000 g | PACK | Freq: Every day | ORAL | Status: DC | PRN

## 2018-04-23 MED ORDER — IOPAMIDOL (ISOVUE-370) INJECTION 76%
100.0000 mL | Freq: Once | INTRAVENOUS | Status: AC | PRN
Start: 2018-04-23 — End: 2018-04-23
  Administered 2018-04-23: 100 mL via INTRAVENOUS

## 2018-04-23 MED ORDER — ACETAMINOPHEN 325 MG PO TABS: 650.0000 mg | ORAL_TABLET | Freq: Four times a day (QID) | ORAL | Status: DC | PRN

## 2018-04-23 MED ORDER — ASPIRIN 81 MG PO CHEW
324.0000 mg | CHEWABLE_TABLET | Freq: Once | ORAL | Status: AC
  Administered 2018-04-23: 324 mg via ORAL
  Filled 2018-04-23: qty 4

## 2018-04-23 MED ORDER — IOPAMIDOL (ISOVUE-370) INJECTION 76%
INTRAVENOUS | Status: AC
  Filled 2018-04-23: qty 100

## 2018-04-23 MED ORDER — HEPARIN (PORCINE) IN NACL 100-0.45 UNIT/ML-% IJ SOLN
1550.0000 [IU]/h | INTRAMUSCULAR | Status: AC
  Administered 2018-04-23 – 2018-04-24 (×2): 1550 [IU]/h via INTRAVENOUS
  Filled 2018-04-23 (×4): qty 250

## 2018-04-23 MED ORDER — ORAL CARE MOUTH RINSE
15.0000 mL | Freq: Two times a day (BID) | OROMUCOSAL | Status: DC
  Administered 2018-04-24 – 2018-04-25 (×2): 15 mL via OROMUCOSAL
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
## 2018-04-23 NOTE — ED Notes (Signed)
Patient transported to CT 

## 2018-04-23 NOTE — Progress Notes (Signed)
ANTICOAGULATION CONSULT NOTE - Initial Consult   Pharmacy Consult for IV heparin Indication: pulmonary embolus  No Known Allergies  Patient Measurements: Height: 5\' 9"  (175.3 cm) Weight: 196 lb (88.9 kg) IBW/kg (Calculated) : 70.7 Heparin Dosing Weight:   Vital Signs: Temp: 97.8 F (36.6 C) (07/24 1520) Temp Source: Oral (07/24 1520) BP: 147/115 (07/24 2000) Pulse Rate: 107 (07/24 2000)  Labs: Recent Labs    04/23/18 1600 04/23/18 1700  HGB 15.3  --   HCT 45.3  --   PLT 156  --   LABPROT  --  12.5  INR  --  0.95  CREATININE 0.97  --     Estimated Creatinine Clearance: 93.8 mL/min (by C-G formula based on SCr of 0.97 mg/dL).   Medical History: Past Medical History:  Diagnosis Date  . Allergy   . Arthritis   . Diverticulitis 2012  . Gout 2007  . History of gastroesophageal reflux (GERD)   . Overweight (BMI 25.0-29.9)     Medications:  Scheduled:  . allopurinol  100 mg Oral Daily  . iopamidol        Assessment: Pharmacy is consulted to dose heparin in 57 yo male diagnosed with PE.  CT angiography of the chest with contrast showed acute bilateral pulmonary emboli involving all lobar arteries casting into subsegmental branches, multifocal occlusive PE. Large volume of pulmonary emboli in the lungs bilaterally, with dilatation of the right atrium and right ventricle (RV to LV ratio of 1.5) indicative of elevated right-sided heart pressures and right heart strain.   Today, 04/23/18   Baseline labs are WNL: INR 0.95, PT 12.5, D-Dimer 5.26  Hgb 15.3, plt 156  Scr 0.97, CrCl 94 ml/min   Goal of Therapy:  Heparin level 0.3-0.7 units/ml Monitor platelets by anticoagulation protocol: Yes   Plan:   Heparin 5000 unit bolus, then heparin 1550 units/hr  HL 6 hours after start of infusion  CBC ordered with AM labs  Monitor for signs and symptoms of bleeding    Adalberto ColeNikola Stockton Nunley, PharmD, BCPS Pager [phone] 04/23/2018 9:00 PM
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
## 2018-04-23 NOTE — ED Notes (Signed)
Patient transported to X-ray 
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
## 2018-04-23 NOTE — ED Notes (Signed)
ED Provider at bedside. 

## 2018-04-23 NOTE — ED Notes (Signed)
Patient has been informed to lay down in the bed and not get up. Multiple staff members have seen this patient move around in bed and stand up to get his cell phone. Patient has been redirected multiple times.

## 2018-04-24 ENCOUNTER — Inpatient Hospital Stay (HOSPITAL_COMMUNITY): Payer: BLUE CROSS/BLUE SHIELD

## 2018-04-24 DIAGNOSIS — I2699 Other pulmonary embolism without acute cor pulmonale: Secondary | ICD-10-CM

## 2018-04-24 DIAGNOSIS — I361 Nonrheumatic tricuspid (valve) insufficiency: Secondary | ICD-10-CM

## 2018-04-24 DIAGNOSIS — J9691 Respiratory failure, unspecified with hypoxia: Secondary | ICD-10-CM | POA: Diagnosis present

## 2018-04-24 DIAGNOSIS — R072 Precordial pain: Secondary | ICD-10-CM

## 2018-04-24 DIAGNOSIS — M7989 Other specified soft tissue disorders: Secondary | ICD-10-CM

## 2018-04-24 DIAGNOSIS — R0602 Shortness of breath: Secondary | ICD-10-CM

## 2018-04-24 DIAGNOSIS — J9601 Acute respiratory failure with hypoxia: Secondary | ICD-10-CM

## 2018-04-24 LAB — CBC
HCT: 40.4 % (ref 39.0–52.0)
HCT: 43.1 % (ref 39.0–52.0)
Hemoglobin: 13.8 g/dL (ref 13.0–17.0)
Hemoglobin: 14.4 g/dL (ref 13.0–17.0)
MCH: 34.2 pg — ABNORMAL HIGH (ref 26.0–34.0)
MCH: 34 pg (ref 26.0–34.0)
MCHC: 34.2 g/dL (ref 30.0–36.0)
MCHC: 33.4 g/dL (ref 30.0–36.0)
MCV: 100 fL (ref 78.0–100.0)
MCV: 101.7 fL — ABNORMAL HIGH (ref 78.0–100.0)
Platelets: 175 10*3/uL (ref 150–400)
Platelets: 166 10*3/uL (ref 150–400)
RBC: 4.04 MIL/uL — ABNORMAL LOW (ref 4.22–5.81)
RBC: 4.24 MIL/uL (ref 4.22–5.81)
RDW: 13.5 % (ref 11.5–15.5)
RDW: 13.9 % (ref 11.5–15.5)
WBC: 6 10*3/uL (ref 4.0–10.5)
WBC: 4.9 10*3/uL (ref 4.0–10.5)

## 2018-04-24 LAB — HEPARIN LEVEL (UNFRACTIONATED)
Heparin Unfractionated: 0.68 IU/mL (ref 0.30–0.70)
Heparin Unfractionated: 0.55 IU/mL (ref 0.30–0.70)

## 2018-04-24 LAB — ECHOCARDIOGRAM COMPLETE
Height: 69 in
Weight: 3136 oz

## 2018-04-24 MED ORDER — ZOLPIDEM TARTRATE 5 MG PO TABS
5.0000 mg | ORAL_TABLET | Freq: Once | ORAL | Status: AC
  Administered 2018-04-24: 5 mg via ORAL
  Filled 2018-04-24: qty 1

## 2018-04-24 MED ORDER — LORAZEPAM 1 MG PO TABS: 1.0000 mg | ORAL_TABLET | Freq: Once | ORAL | Status: DC

## 2018-04-24 MED ORDER — ZOLPIDEM TARTRATE 5 MG PO TABS
5.0000 mg | ORAL_TABLET | Freq: Every evening | ORAL | Status: DC | PRN
  Administered 2018-04-24 – 2018-04-27 (×4): 5 mg via ORAL
  Filled 2018-04-24 (×4): qty 1

## 2018-04-24 NOTE — Progress Notes (Signed)
  Echocardiogram 2D Echocardiogram has been performed.  Dorena Dewiffany G Krystn Dermody 04/24/2018, 12:25 PM

## 2018-04-24 NOTE — Progress Notes (Signed)
ANTICOAGULATION CONSULT NOTE -  Consult   Pharmacy Consult for IV heparin Indication: pulmonary embolus  No Known Allergies  Patient Measurements: Height: 5\' 9"  (175.3 cm) Weight: 196 lb (88.9 kg) IBW/kg (Calculated) : 70.7 Heparin Dosing Weight:   Vital Signs: Temp: 98.3 F (36.8 C) (07/25 0750) Temp Source: Oral (07/25 0750) BP: 140/97 (07/25 0733) Pulse Rate: 103 (07/25 1100)  Labs: Recent Labs    04/23/18 1600 04/23/18 1700 04/23/18 2022 04/24/18 0137 04/24/18 1057  HGB 15.3  --   --  13.8 14.4  HCT 45.3  --   --  40.4 43.1  PLT 156  --   --  175 166  LABPROT  --  12.5  --   --   --   INR  --  0.95  --   --   --   HEPARINUNFRC  --   --   --  0.68 0.55  CREATININE 0.97  --   --   --   --   TROPONINI  --   --  0.04*  --   --     Estimated Creatinine Clearance: 93.8 mL/min (by C-G formula based on SCr of 0.97 mg/dL).  Assessment: Pharmacy is consulted to dose heparin in 57 yo male diagnosed with PE.  CT angiography of the chest with contrast showed acute bilateral pulmonary emboli involving all lobar arteries casting into subsegmental branches, multifocal occlusive PE. Large volume of pulmonary emboli in the lungs bilaterally, with dilatation of the right atrium and right ventricle (RV to LV ratio of 1.5) indicative of elevated right-sided heart pressures and right heart strain.   7/24  Baseline labs are WNL: INR 0.95, PT 12.5, D-Dimer 5.26  Hgb 15.3, plt 156  Scr 0.97, CrCl 94 ml/min   Today, 7/25  0137 HL=0.68 at goal, no infusion or bleeding issues per RN.  CBC stable, no bleeding report  1057 HL = 0.55 at goal on 1550 units/hr   Goal of Therapy:  Heparin level 0.3-0.7 units/ml Monitor platelets by anticoagulation protocol: Yes   Plan:   Continue heparin drip at 1550 units/hr  Daily CBC and HL  Monitor for signs and symptoms of bleeding  F/u for transition to NOAC  Herby AbrahamMichelle T. Neliah Cuyler, Pharm.D 04/24/2018 12:17 PM

## 2018-04-24 NOTE — Progress Notes (Signed)
ANTICOAGULATION CONSULT NOTE -  Consult   Pharmacy Consult for IV heparin Indication: pulmonary embolus  No Known Allergies  Patient Measurements: Height: 5\' 9"  (175.3 cm) Weight: 196 lb (88.9 kg) IBW/kg (Calculated) : 70.7 Heparin Dosing Weight:   Vital Signs: Temp: 98.4 F (36.9 C) (07/24 2340) Temp Source: Oral (07/24 2340) BP: 147/115 (07/24 2000) Pulse Rate: 107 (07/24 2000)  Labs: Recent Labs    04/23/18 1600 04/23/18 1700 04/23/18 2022 04/24/18 0137  HGB 15.3  --   --  13.8  HCT 45.3  --   --  40.4  PLT 156  --   --  175  LABPROT  --  12.5  --   --   INR  --  0.95  --   --   HEPARINUNFRC  --   --   --  0.68  CREATININE 0.97  --   --   --   TROPONINI  --   --  0.04*  --     Estimated Creatinine Clearance: 93.8 mL/min (by C-G formula based on SCr of 0.97 mg/dL).   Medical History: Past Medical History:  Diagnosis Date  . Allergy   . Arthritis   . Diverticulitis 2012  . Gout 2007  . History of gastroesophageal reflux (GERD)   . Overweight (BMI 25.0-29.9)     Medications:  Scheduled:  . allopurinol  100 mg Oral Daily  . iopamidol      . LORazepam  1 mg Oral Once  . mouth rinse  15 mL Mouth Rinse BID    Assessment: Pharmacy is consulted to dose heparin in 57 yo male diagnosed with PE.  CT angiography of the chest with contrast showed acute bilateral pulmonary emboli involving all lobar arteries casting into subsegmental branches, multifocal occlusive PE. Large volume of pulmonary emboli in the lungs bilaterally, with dilatation of the right atrium and right ventricle (RV to LV ratio of 1.5) indicative of elevated right-sided heart pressures and right heart strain.   7/24  Baseline labs are WNL: INR 0.95, PT 12.5, D-Dimer 5.26  Hgb 15.3, plt 156  Scr 0.97, CrCl 94 ml/min Today, 7/25  0137 HL=0.68 at goal, no infusion or bleeding issues per RN.    Goal of Therapy:  Heparin level 0.3-0.7 units/ml Monitor platelets by anticoagulation  protocol: Yes   Plan:   Continue heparin drip at 1550 units/hr  Recheck HL today to ensure stays in therapeutic range  CBC ordered with AM labs  Monitor for signs and symptoms of bleeding     Lorenza EvangelistGreen, Tanairi Cypert R 04/24/2018 2:27 AM
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
## 2018-04-24 NOTE — Progress Notes (Signed)
Bilateral lower extremity venous duplex completed/ Preliminary report - Right - Positive for an acute DVT coursing from the mid PTV through the popliteal vein. Left no evidence of a DVT. Graybar ElectricVirginia Malik [name], RVS  04/24/2018 10:26 AM

## 2018-04-25 ENCOUNTER — Encounter (HOSPITAL_COMMUNITY): Payer: Self-pay | Admitting: *Deleted

## 2018-04-25 ENCOUNTER — Telehealth: Payer: Self-pay | Admitting: Medical

## 2018-04-25 DIAGNOSIS — I82431 Acute embolism and thrombosis of right popliteal vein: Secondary | ICD-10-CM

## 2018-04-25 DIAGNOSIS — I2699 Other pulmonary embolism without acute cor pulmonale: Secondary | ICD-10-CM

## 2018-04-25 LAB — CBC
HCT: 38.5 % — ABNORMAL LOW (ref 39.0–52.0)
Hemoglobin: 12.9 g/dL — ABNORMAL LOW (ref 13.0–17.0)
MCH: 33.6 pg (ref 26.0–34.0)
MCHC: 33.5 g/dL (ref 30.0–36.0)
MCV: 100.3 fL — ABNORMAL HIGH (ref 78.0–100.0)
Platelets: 163 10*3/uL (ref 150–400)
RBC: 3.84 MIL/uL — ABNORMAL LOW (ref 4.22–5.81)
RDW: 13.6 % (ref 11.5–15.5)
WBC: 5 10*3/uL (ref 4.0–10.5)

## 2018-04-25 LAB — HEPARIN LEVEL (UNFRACTIONATED): Heparin Unfractionated: 0.49 IU/mL (ref 0.30–0.70)

## 2018-04-25 MED ORDER — RIVAROXABAN 20 MG PO TABS: 20.0000 mg | ORAL_TABLET | Freq: Every day | ORAL | Status: DC

## 2018-04-25 MED ORDER — RIVAROXABAN 15 MG PO TABS
15.0000 mg | ORAL_TABLET | Freq: Two times a day (BID) | ORAL | Status: DC
  Administered 2018-04-25 – 2018-04-28 (×6): 15 mg via ORAL
  Filled 2018-04-25 (×6): qty 1

## 2018-04-25 NOTE — Telephone Encounter (Signed)
Received disability form to be completed. Sending back to Cindi per office protocol.
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
## 2018-04-25 NOTE — Progress Notes (Signed)
Pt transferred from ICU to room 1445. PT was transferred to chair once in room. Pt was orientated to the unit and call bell was put within reach. No questions or concerns from the pt at this time.

## 2018-04-25 NOTE — Discharge Instructions (Signed)
Information on my medicine - XARELTO® (rivaroxaban) ° °This medication education was reviewed with me or my healthcare representative as part of my discharge preparation.  °WHY WAS XARELTO® PRESCRIBED FOR YOU? °Xarelto® was prescribed to treat blood clots that may have been found in the veins of your legs (deep vein thrombosis) or in your lungs (pulmonary embolism) and to reduce the risk of them occurring again. ° °What do you need to know about Xarelto®? °The starting dose is one 15 mg tablet taken TWICE daily with food for the FIRST 21 DAYS then after all 15 mg tablets have been taken  the dose is changed to one 20 mg tablet taken ONCE A DAY with your evening meal. ° °DO NOT stop taking Xarelto® without talking to the health care provider who prescribed the medication.  Refill your prescription for 20 mg tablets before you run out. ° °After discharge, you should have regular check-up appointments with your healthcare provider that is prescribing your Xarelto®.  In the future your dose may need to be changed if your kidney function changes by a significant amount. ° °What do you do if you miss a dose? °If you are taking Xarelto® TWICE DAILY and you miss a dose, take it as soon as you remember. You may take two 15 mg tablets (total 30 mg) at the same time then resume your regularly scheduled 15 mg twice daily the next day. ° °If you are taking Xarelto® ONCE DAILY and you miss a dose, take it as soon as you remember on the same day then continue your regularly scheduled once daily regimen the next day. Do not take two doses of Xarelto® at the same time.  ° °Important Safety Information °Xarelto® is a blood thinner medicine that can cause bleeding. You should call your healthcare provider right away if you experience any of the following: °? Bleeding from an injury or your nose that does not stop. °? Unusual colored urine (red or dark brown) or unusual colored stools (red or black). °? Unusual bruising for unknown  reasons. °? A serious fall or if you hit your head (even if there is no bleeding). ° °Some medicines may interact with Xarelto® and might increase your risk of bleeding while on Xarelto®. To help avoid this, consult your healthcare provider or pharmacist prior to using any new prescription or non-prescription medications, including herbals, vitamins, non-steroidal anti-inflammatory drugs (NSAIDs) and supplements. ° °This website has more information on Xarelto®: www.xarelto.com. °

## 2018-04-25 NOTE — Progress Notes (Addendum)
ANTICOAGULATION CONSULT NOTE -  Consult   Pharmacy Consult for IV heparin>>xarelto Indication: pulmonary embolus  No Known Allergies  Patient Measurements: Height: 5\' 9"  (175.3 cm) Weight: 196 lb (88.9 kg) IBW/kg (Calculated) : 70.7   Vital Signs: Temp: 98.6 F (37 C) (07/26 0745) Temp Source: Oral (07/26 0745) BP: 151/104 (07/26 0800) Pulse Rate: 101 (07/26 1000)  Labs: Recent Labs    04/23/18 1600 04/23/18 1700 04/23/18 2022 04/24/18 0137 04/24/18 1057 04/25/18 0341  HGB 15.3  --   --  13.8 14.4 12.9*  HCT 45.3  --   --  40.4 43.1 38.5*  PLT 156  --   --  175 166 163  LABPROT  --  12.5  --   --   --   --   INR  --  0.95  --   --   --   --   HEPARINUNFRC  --   --   --  0.68 0.55 0.49  CREATININE 0.97  --   --   --   --   --   TROPONINI  --   --  0.04*  --   --   --     Estimated Creatinine Clearance: 93.8 mL/min (by C-G formula based on SCr of 0.97 mg/dL).  Assessment: Pharmacy is consulted to dose heparin in 57 yo male diagnosed with PE.  CT angiography of the chest with contrast showed acute bilateral pulmonary emboli involving all lobar arteries casting into subsegmental branches, multifocal occlusive PE. Large volume of pulmonary emboli in the lungs bilaterally, with dilatation of the right atrium and right ventricle (RV to LV ratio of 1.5) indicative of elevated right-sided heart pressures and right heart strain.   7/24  Baseline labs are WNL: INR 0.95, PT 12.5, D-Dimer 5.26  Hgb 15.3, plt 156  Scr 0.97, CrCl 94 ml/min   04/25/2018   0137 HL=0.49 at goal, no infusion or bleeding issues per RN.   no bleeding reported, Hg down to 12.9, PLTC 163.   Goal of Therapy:  Heparin level 0.3-0.7 units/ml Monitor platelets by anticoagulation protocol: Yes   Plan:   Continue heparin drip at 1550 units/hr  Daily CBC and HL  Monitor for signs and symptoms of bleeding  F/u for transition to DOAC  Herby AbrahamMichelle T. Vaniya Augspurger, Pharm.D 04/25/2018 10:22  AM\  Addendum: Transition to Xarelto Plan: Continue heparin drip until 1700 then start Xarelto 15 mg po bid with food x 21 days then transition to 20 mg qsupper Will educate pt and give 30 day free card  Herby AbrahamMichelle T. Valerian Jewel, Pharm.D 04/25/2018 12:08 PM

## 2018-04-26 DIAGNOSIS — I82441 Acute embolism and thrombosis of right tibial vein: Secondary | ICD-10-CM

## 2018-04-26 LAB — CBC
HCT: 38.7 % — ABNORMAL LOW (ref 39.0–52.0)
Hemoglobin: 13.2 g/dL (ref 13.0–17.0)
MCH: 34.1 pg — ABNORMAL HIGH (ref 26.0–34.0)
MCHC: 34.1 g/dL (ref 30.0–36.0)
MCV: 100 fL (ref 78.0–100.0)
Platelets: 201 10*3/uL (ref 150–400)
RBC: 3.87 MIL/uL — ABNORMAL LOW (ref 4.22–5.81)
RDW: 13.4 % (ref 11.5–15.5)
WBC: 5.4 10*3/uL (ref 4.0–10.5)

## 2018-04-26 LAB — BASIC METABOLIC PANEL
Anion gap: 9 (ref 5–15)
BUN: 13 mg/dL (ref 6–20)
CO2: 21 mmol/L — ABNORMAL LOW (ref 22–32)
Calcium: 8.7 mg/dL — ABNORMAL LOW (ref 8.9–10.3)
Chloride: 110 mmol/L (ref 98–111)
Creatinine, Ser: 0.81 mg/dL (ref 0.61–1.24)
GFR calc Af Amer: >60 mL/min (ref >60)
GFR calc non Af Amer: >60 mL/min (ref >60)
Glucose, Bld: 100 mg/dL — ABNORMAL HIGH (ref 70–99)
Potassium: 3.8 mmol/L (ref 3.5–5.1)
Sodium: 140 mmol/L (ref 135–145)

## 2018-04-26 NOTE — Progress Notes (Signed)
Oxygen saturation sitting at rest is 98%. Patient ambulated 160 ft on room air and saturation stayed between 97%-100%. Pt did have SOB with ambulation. Melton Alarana A Melyna Huron, RN
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
## 2018-04-28 ENCOUNTER — Telehealth: Payer: Self-pay | Admitting: Family Medicine

## 2018-04-28 DIAGNOSIS — Z0279 Encounter for issue of other medical certificate: Secondary | ICD-10-CM

## 2018-04-28 MED ORDER — RIVAROXABAN (XARELTO) VTE STARTER PACK (15 & 20 MG)
ORAL_TABLET | ORAL | 0 refills | Status: DC
Start: 2018-04-28 — End: 2018-05-26

## 2018-04-28 NOTE — Telephone Encounter (Signed)
Pt called wanting to know if we have sent the disability forms to his employer that he faxed here on Friday? Please advise pt [phone]

## 2018-04-28 NOTE — Care Management Note (Signed)
Received call from attending Dr. Dennison NancyS. Nettey [facility]upon d/c today our pharmacy has given patient a xarelto 30day free discount coupon-I have called [facility] [address]-[phone]-[facility]-he says patient has no charge for [facility], & [facility] the [facility]. I have called the patient's home-[phone]-busy # over 11 calls made. I have called the pharmacy again to explain that I have not reached the patient-spoke to Lauren-they have an automated call back system for call backs. MD updated.

## 2018-04-28 NOTE — Progress Notes (Signed)
Pt discharged from the unit. Discharge instructions reviewed wit the pt. Pt left before giving doctors note. Will call pt and leave note at nurses station.

## 2018-04-28 NOTE — Telephone Encounter (Signed)
I believe you saw these earlier correct?

## 2018-04-28 NOTE — Progress Notes (Signed)
D/w Dr Dennison NancyS Nettey - no need for pccm to see  Dr. Kalman ShanMurali Ryder Man, M.D., [facility].C.C.P Pulmonary and Critical Care Medicine Staff Physician, [facility] Center Director - Interstitial Lung Disease  Program  Pulmonary Fibrosis [facility] [place], [place], [id number]  Pager: [phone], If no answer or between  15:00h - 7:00h: call [phone] Telephone: [phone]
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
## 2018-04-28 NOTE — Telephone Encounter (Signed)
Form has been faxed and patient notified.

## 2018-04-29 ENCOUNTER — Telehealth: Payer: Self-pay | Admitting: Family Medicine

## 2018-04-29 NOTE — Telephone Encounter (Signed)
Diana took care of this

## 2018-04-29 NOTE — Telephone Encounter (Signed)
Pt called about his Disability form.  We faxed the FMLA yesterday, not realizing he also had a disability form he wanted completed based on his current hospital stay.  He was released yesterday.  Admitted on 04/23/18.  He has a hospital follow up appt with Vincenza HewsShane 05/08/18 already scheduled.  FMLA faxed yesterday regarding gout.  Disability form faxed today [phone] regarding hospital stay PE.

## 2018-05-08 ENCOUNTER — Inpatient Hospital Stay: Payer: BLUE CROSS/BLUE SHIELD | Admitting: Medical

## 2018-05-12 ENCOUNTER — Ambulatory Visit: Payer: BLUE CROSS/BLUE SHIELD

## 2018-05-12 ENCOUNTER — Encounter: Payer: BLUE CROSS/BLUE SHIELD | Admitting: Podiatry

## 2018-05-16 ENCOUNTER — Ambulatory Visit (INDEPENDENT_AMBULATORY_CARE_PROVIDER_SITE_OTHER): Payer: BLUE CROSS/BLUE SHIELD | Admitting: Medical

## 2018-05-16 ENCOUNTER — Encounter: Payer: Self-pay | Admitting: Medical

## 2018-05-16 ENCOUNTER — Inpatient Hospital Stay: Payer: BLUE CROSS/BLUE SHIELD | Admitting: Medical

## 2018-05-16 VITALS — BP 122/78 | HR 105 | Temp 97.9°F | Ht 68.0 in | Wt 177.8 lb

## 2018-05-16 DIAGNOSIS — I2609 Other pulmonary embolism with acute cor pulmonale: Secondary | ICD-10-CM | POA: Diagnosis not present

## 2018-05-16 DIAGNOSIS — S2231XA Fracture of one rib, right side, initial encounter for closed fracture: Secondary | ICD-10-CM

## 2018-05-16 DIAGNOSIS — M21961 Unspecified acquired deformity of right lower leg: Secondary | ICD-10-CM

## 2018-05-16 DIAGNOSIS — S20211A Contusion of right front wall of thorax, initial encounter: Secondary | ICD-10-CM | POA: Diagnosis not present

## 2018-05-16 DIAGNOSIS — R931 Abnormal findings on diagnostic imaging of heart and coronary circulation: Secondary | ICD-10-CM

## 2018-05-16 DIAGNOSIS — S7011XA Contusion of right thigh, initial encounter: Secondary | ICD-10-CM

## 2018-05-16 DIAGNOSIS — I82441 Acute embolism and thrombosis of right tibial vein: Secondary | ICD-10-CM | POA: Diagnosis not present

## 2018-05-16 DIAGNOSIS — E790 Hyperuricemia without signs of inflammatory arthritis and tophaceous disease: Secondary | ICD-10-CM

## 2018-05-16 DIAGNOSIS — R030 Elevated blood-pressure reading, without diagnosis of hypertension: Secondary | ICD-10-CM

## 2018-05-16 DIAGNOSIS — G8929 Other chronic pain: Secondary | ICD-10-CM

## 2018-05-16 DIAGNOSIS — S7012XA Contusion of left thigh, initial encounter: Secondary | ICD-10-CM

## 2018-05-16 DIAGNOSIS — J9601 Acute respiratory failure with hypoxia: Secondary | ICD-10-CM | POA: Diagnosis not present

## 2018-05-16 DIAGNOSIS — M25571 Pain in right ankle and joints of right foot: Secondary | ICD-10-CM

## 2018-05-16 DIAGNOSIS — M79671 Pain in right foot: Secondary | ICD-10-CM
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
## 2018-05-16 NOTE — Patient Instructions (Signed)
Recommendations:  Please purchase a pumice stone over-the-counter  Nightly do a 20-minute foot soak in the bath then use the pumice stone to sand down of the colon a little bit each night  This will gradually help the corn resolve  You should be taking the Xarelto 20 mg daily at this point  He will need to take Xarelto for likely 6 months total  I am going to refer you to cardiology for abnormal echocardiogram  We are checking her uric acid level today.  I will send out allopurinol sometime this week and once I get results as you likely need to be on the higher dose  For acute pain use Tylenol over-the-counter  Avoid any type of anti-inflammatory such as ibuprofen, Aleve, Advil, Motrin as these can interact with Xarelto  Follow-up with podiatry as planned

## 2018-05-19 ENCOUNTER — Other Ambulatory Visit: Payer: Self-pay | Admitting: Medical

## 2018-05-20 ENCOUNTER — Telehealth: Payer: Self-pay | Admitting: Medical

## 2018-05-20 ENCOUNTER — Other Ambulatory Visit: Payer: Self-pay | Admitting: Medical

## 2018-05-20 MED ORDER — RIVAROXABAN 20 MG PO TABS: 20.0000 mg | ORAL_TABLET | Freq: Every day | ORAL | 1 refills | Status: DC

## 2018-05-20 NOTE — Telephone Encounter (Signed)
Pt called and is requesting that his medicine of Xarelto be sent in pt uses [id number] - [place], [place] - [address] pt can be reached at [phone]

## 2018-05-20 NOTE — Telephone Encounter (Signed)
done

## 2018-05-21 NOTE — Telephone Encounter (Signed)
Called and left voicemail that rx was sent to the pharmacy

## 2018-05-22 ENCOUNTER — Encounter: Payer: Self-pay | Admitting: Internal Medicine

## 2018-05-23 ENCOUNTER — Encounter: Payer: Self-pay | Admitting: Podiatry

## 2018-05-23 ENCOUNTER — Ambulatory Visit (INDEPENDENT_AMBULATORY_CARE_PROVIDER_SITE_OTHER): Payer: BLUE CROSS/BLUE SHIELD

## 2018-05-23 ENCOUNTER — Ambulatory Visit (INDEPENDENT_AMBULATORY_CARE_PROVIDER_SITE_OTHER): Payer: BLUE CROSS/BLUE SHIELD | Admitting: Podiatry

## 2018-05-23 VITALS — BP 159/101 | HR 98 | Resp 16

## 2018-05-23 DIAGNOSIS — Q6689 Other  specified congenital deformities of feet: Secondary | ICD-10-CM | POA: Diagnosis not present

## 2018-05-23 DIAGNOSIS — T148XXA Other injury of unspecified body region, initial encounter: Secondary | ICD-10-CM

## 2018-05-23 DIAGNOSIS — M779 Enthesopathy, unspecified: Secondary | ICD-10-CM | POA: Diagnosis not present

## 2018-05-23 LAB — FACTOR V LEIDEN

## 2018-05-23 LAB — URIC ACID: Uric Acid: 5.3 mg/dL (ref 3.7–8.6)

## 2018-05-23 NOTE — Progress Notes (Signed)
Subjective:   Patient ID: Alexander [name], male   DOB: 57 y.o.   MRN: [id number]   HPI Patient presents with severe pain in his right ankle of long-term duration.  States is gradually gotten worse over the last few years and he does not remember specifically any injury.  Patient is also currently being treated for a pulmonary emboli and is on Xarelto and does not appear to be having any sequela from this.  He is walking with an a propulsive gait pattern and has trouble bearing weight on his right foot and has had previous injections by another physician and inserts   Review of Systems  All other systems reviewed and are negative.       Objective:  Physical Exam  Constitutional: He appears well-developed and well-nourished.  Cardiovascular: Intact distal pulses.  Pulmonary/Chest: Effort normal.  Musculoskeletal: Normal range of motion.  Neurological: He is alert.  Skin: Skin is warm.  Nursing note and vitals reviewed.   Neurovascular status intact muscle strength is adequate range of motion compromise subtalar joint right with patient noted to have exquisite discomfort in the right sinus tarsi in the right medial ankle around the posterior tibial tendon with collapse of medial longitudinal arch right and reduced range of motion with crepitus on the right side.  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  At this time we are sending for CT scans to get better understanding of the deformity and most likely he is can require subtalar joint fusion which he wants done.  I am referring him to Dr. Logan BoresEvans for evaluation and probable subtalar joint fusion.  He has not responded any conservative treatments currently  X-rays indicate that there is a subtalar  joint arthritic condition present with collapse of medial longitudinal arch right with the left foot showing normal appearance

## 2018-05-23 NOTE — Addendum Note (Signed)
Addended by: Marylou MccoyQUINTANA, Cacey Willow L on: 05/23/2018 04:26 PM   Modules accepted: Orders

## 2018-05-26 ENCOUNTER — Other Ambulatory Visit: Payer: Self-pay | Admitting: Medical

## 2018-06-06 NOTE — Progress Notes (Signed)
This encounter was created in error - please disregard.

## 2018-06-09 ENCOUNTER — Ambulatory Visit: Payer: BLUE CROSS/BLUE SHIELD | Admitting: Podiatry

## 2018-06-16 ENCOUNTER — Ambulatory Visit: Payer: BLUE CROSS/BLUE SHIELD | Admitting: Podiatry

## 2018-06-16 ENCOUNTER — Encounter (INDEPENDENT_AMBULATORY_CARE_PROVIDER_SITE_OTHER): Payer: Self-pay

## 2018-06-16 ENCOUNTER — Encounter: Payer: Self-pay | Admitting: *Deleted

## 2018-06-16 ENCOUNTER — Encounter: Payer: Self-pay | Admitting: Internal Medicine

## 2018-06-16 ENCOUNTER — Ambulatory Visit: Payer: BLUE CROSS/BLUE SHIELD | Admitting: Internal Medicine

## 2018-06-16 VITALS — BP 132/64 | HR 99 | Ht 68.0 in | Wt 182.6 lb

## 2018-06-16 DIAGNOSIS — R931 Abnormal findings on diagnostic imaging of heart and coronary circulation: Secondary | ICD-10-CM | POA: Diagnosis not present

## 2018-06-16 DIAGNOSIS — I82441 Acute embolism and thrombosis of right tibial vein: Secondary | ICD-10-CM | POA: Diagnosis not present

## 2018-06-16 NOTE — Patient Instructions (Signed)
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
## 2018-06-20 ENCOUNTER — Other Ambulatory Visit: Payer: Self-pay | Admitting: Medical

## 2018-06-25 ENCOUNTER — Ambulatory Visit: Payer: BLUE CROSS/BLUE SHIELD | Admitting: Podiatry

## 2018-06-26 ENCOUNTER — Telehealth: Payer: Self-pay | Admitting: *Deleted

## 2018-06-26 DIAGNOSIS — R931 Abnormal findings on diagnostic imaging of heart and coronary circulation: Secondary | ICD-10-CM

## 2018-06-26 DIAGNOSIS — I82441 Acute embolism and thrombosis of right tibial vein: Secondary | ICD-10-CM

## 2018-06-26 NOTE — Telephone Encounter (Signed)
Left message asking patient to call back. Adv dr Tenny Craw wants him to have lab work and to call back to let us know what day he can come.

## 2018-06-26 NOTE — Telephone Encounter (Signed)
-----   Message from Pricilla Riffle, MD sent at 06/19/2018 11:33 PM EDT ----- Please set pt up for hypcoagulable panel

## 2018-06-27 ENCOUNTER — Ambulatory Visit (HOSPITAL_COMMUNITY): Payer: BLUE CROSS/BLUE SHIELD | Attending: Internal Medicine

## 2018-06-27 ENCOUNTER — Other Ambulatory Visit (HOSPITAL_COMMUNITY): Payer: BLUE CROSS/BLUE SHIELD

## 2018-06-27 ENCOUNTER — Other Ambulatory Visit: Payer: BLUE CROSS/BLUE SHIELD | Admitting: *Deleted

## 2018-06-27 ENCOUNTER — Other Ambulatory Visit: Payer: Self-pay

## 2018-06-27 DIAGNOSIS — R931 Abnormal findings on diagnostic imaging of heart and coronary circulation: Secondary | ICD-10-CM

## 2018-06-27 NOTE — Telephone Encounter (Signed)
Left message for patient to call.

## 2018-06-27 NOTE — Telephone Encounter (Signed)
Patient returning call. Lab appointment made for today since patient is coming in for echo.

## 2018-06-27 NOTE — Telephone Encounter (Signed)
Follow-up   Patient is returning call from 06/26/2018.

## 2018-07-02 ENCOUNTER — Telehealth: Payer: Self-pay | Admitting: Internal Medicine

## 2018-07-02 NOTE — Telephone Encounter (Signed)
New Message   Patient is returning call in reference to echo results. He would like the results to be left on his voicemail if possible.

## 2018-07-02 NOTE — Telephone Encounter (Signed)
Called pt to give the echo results. Pt left a message stating  that it was fine to leave echo results in his voice mail. Left message that echo result is normal. Pt to call back if any questions.

## 2018-07-04 LAB — LUPUS ANTICOAGULANT PANEL
Dilute Viper Venom Time: 80.5 s — ABNORMAL HIGH (ref 0.0–47.0)
PTT Lupus Anticoagulant: 41 s (ref 0.0–51.9)

## 2018-07-04 LAB — BETA-2-GLYCOPROTEIN I ABS, IGG/M/A
Beta-2 Glyco 1 IgA: <9 GPI IgA units (ref 0–25)
Beta-2 Glyco 1 IgM: <9 GPI IgM units (ref 0–32)
Beta-2 Glyco I IgG: <9 GPI IgG units (ref 0–20)

## 2018-07-04 LAB — PROTHROMBIN GENE MUTATION

## 2018-07-04 LAB — ANTITHROMBIN III: AntiThromb III Func: 186 % — ABNORMAL HIGH (ref 75–135)

## 2018-07-04 LAB — DRVVT CONFIRM: dRVVT Confirm: 1.4 ratio — ABNORMAL HIGH (ref 0.8–1.2)

## 2018-07-04 LAB — PROTEIN S, TOTAL: Protein S Ag, Total: 71 % (ref 60–150)

## 2018-07-04 LAB — CARDIOLIPIN ANTIBODIES, IGG, IGM, IGA
Anticardiolipin IgA: <9 APL U/mL (ref 0–11)
Anticardiolipin IgG: <9 GPL U/mL (ref 0–14)
Anticardiolipin IgM: <9 MPL U/mL (ref 0–12)

## 2018-07-04 LAB — PROTEIN S ACTIVITY: Protein S Activity: 153 % — ABNORMAL HIGH (ref 63–140)

## 2018-07-04 LAB — PROTEIN C, TOTAL: Protein C Antigen: 127 % (ref 60–150)

## 2018-07-04 LAB — FACTOR 5 LEIDEN

## 2018-07-04 LAB — HOMOCYSTEINE: Homocysteine: 20.1 umol/L — ABNORMAL HIGH (ref 0.0–15.0)

## 2018-07-04 LAB — PROTEIN C ACTIVITY: Protein C Activity: 129 % (ref 73–180)

## 2018-07-04 LAB — DRVVT MIX: dRVVT Mix: 70.5 s — ABNORMAL HIGH (ref 0.0–47.0)

## 2018-07-07 ENCOUNTER — Ambulatory Visit: Payer: BLUE CROSS/BLUE SHIELD | Admitting: Podiatry

## 2018-07-14 ENCOUNTER — Telehealth: Payer: Self-pay | Admitting: Podiatry

## 2018-07-14 NOTE — Telephone Encounter (Signed)
Left vm for pt to call office  For CT scan results.

## 2018-07-28 ENCOUNTER — Ambulatory Visit (INDEPENDENT_AMBULATORY_CARE_PROVIDER_SITE_OTHER): Payer: BLUE CROSS/BLUE SHIELD | Admitting: Podiatry

## 2018-07-28 DIAGNOSIS — M19071 Primary osteoarthritis, right ankle and foot: Secondary | ICD-10-CM

## 2018-07-28 NOTE — Progress Notes (Signed)
   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  Patient had a CT scan performed at [facility] and he presents today with the CT scan results and for surgical consultation.  Patient has been dealing with chronic right foot pain for several years.  He states that he is ready for surgery.  All conservative modalities have been unsuccessful to alleviate any of the symptoms.  Past Medical History:  Diagnosis Date  . Allergy   . Arthritis   . Diverticulitis 2012  . Gout 2007  . History of gastroesophageal reflux (GERD)   . Overweight (BMI 25.0-29.9)      Physical Exam: General: The patient is alert and oriented x3 in no acute distress.  Dermatology: Skin is warm, dry and supple bilateral lower extremities. Negative for open lesions or macerations.  Vascular: Palpable pedal pulses bilaterally. No edema or erythema noted. Capillary refill within normal limits.  Neurological: Epicritic and protective threshold grossly intact bilaterally.   Musculoskeletal Exam: Pes planus deformity noted right lower extremity with rear foot valgus.  Range of left motion limited with inversion and eversion of the subtalar joint.  Muscle strength 5/5 in all groups bilateral.   Radiographic Exam taken 05/23/2018:  Pes planus deformity with collapse of the midfoot and significant DJD with joint space narrowing of the talonavicular and calcaneocuboid joints as well as the subtalar joint.  CT scan taken 07/14/2018: 1. No acute fracture. 2. 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 3. Tiny osteochondral lesion of the lateral talar dome and adjacent cyst/erosion along the lateral malleolus.  4. Pes planus.  5. Moderate first MTP osteoarthritis.  Assessment: 1.  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  DJD right rear  foot  Plan of Care:  1. Patient evaluated. X-Rays and CT reviewed.  2. Today we discussed the conservative versus surgical management of the presenting pathology. The patient opts for surgical management. All possible complications and details of the procedure were explained. All patient questions were answered. No guarantees were expressed or implied. 3. Authorization for surgery was initiated today. Surgery will consist of triple arthrodesis right foot.  Application of below-knee fiberglass cast right. 4.  Authorization requested for knee scooter and shower chair. 5.  Patient will require medical clearance from cardiology secondary to previous history of blood clots. 6.  Return to clinic 1 week postop        Felecia Shelling, DPM [facility]  Dr. Felecia Shelling, DPM    [address] [place], [place] [address]                Office [phone]  Fax [phone]

## 2018-07-28 NOTE — Patient Instructions (Signed)
Pre-Operative Instructions  Congratulations, you have decided to take an important step towards improving your quality of life.  You can be assured that the doctors and staff at [facility] will be with you every step of the way.  Here are some important things you should know:  1. Plan to be at the surgery center/hospital at least 1 (one) hour prior to your scheduled time, unless otherwise directed by the surgical center/hospital staff.  You must have a responsible adult accompany you, remain during the surgery and drive you home.  Make sure you have directions to the surgical center/hospital to ensure you arrive on time. 2. If you are having surgery at [facility] or [facility] hospitals, you will need a copy of your medical history and physical form from your family physician within one month prior to the date of surgery. We will give you a form for your primary physician to complete.  3. We make every effort to accommodate the date you request for surgery.  However, there are times where surgery dates or times have to be moved.  We will contact you as soon as possible if a change in schedule is required.   4. No aspirin/ibuprofen for one week before surgery.  If you are on aspirin, any non-steroidal anti-inflammatory medications (Mobic, Aleve, Ibuprofen) should not be taken seven (7) days prior to your surgery.  You make take Tylenol for pain prior to surgery.  5. Medications - If you are taking daily heart and blood pressure medications, seizure, reflux, allergy, asthma, anxiety, pain or diabetes medications, make sure you notify the surgery center/hospital before the day of surgery so they can tell you which medications you should take or avoid the day of surgery. 6. No food or drink after midnight the night before surgery unless directed otherwise by surgical center/hospital staff. 7. No alcoholic beverages 24-hours prior to surgery.  No smoking 24-hours prior or 24-hours after  surgery. 8. Wear loose pants or shorts. They should be loose enough to fit over bandages, boots, and casts. 9. Don't wear slip-on shoes. Sneakers are preferred. 10. Bring your boot with you to the surgery center/hospital.  Also bring crutches or a walker if your physician has prescribed it for you.  If you do not have this equipment, it will be provided for you after surgery. 11. If you have not been contacted by the surgery center/hospital by the day before your surgery, call to confirm the date and time of your surgery. 12. Leave-time from work may vary depending on the type of surgery you have.  Appropriate arrangements should be made prior to surgery with your employer. 13. Prescriptions will be provided immediately following surgery by your doctor.  Fill these as soon as possible after surgery and take the medication as directed. Pain medications will not be refilled on weekends and must be approved by the doctor. 14. Remove nail polish on the operative foot and avoid getting pedicures prior to surgery. 15. Wash the night before surgery.  The night before surgery wash the foot and leg well with water and the antibacterial soap provided. Be sure to pay special attention to beneath the toenails and in between the toes.  Wash for at least three (3) minutes. Rinse thoroughly with water and dry well with a towel.  Perform this wash unless told not to do so by your physician.  Enclosed: 1 Ice pack (please put in freezer the night before surgery)   1 Hibiclens skin cleaner     Pre-op instructions  If you have any questions regarding the instructions, please do not hesitate to call our office.  [place]: [address], [place], [place] [address] -- [phone]  [place]: [address] [place], [place] [address] -- [phone]  [place]: [address]  , [place] [address] -- [phone]  [place]: [address], [address], [place], [place] [address] -- [phone]  Website:  [email or web address] 

## 2018-07-30 ENCOUNTER — Other Ambulatory Visit: Payer: Self-pay | Admitting: *Deleted

## 2018-07-30 DIAGNOSIS — M19071 Primary osteoarthritis, right ankle and foot: Secondary | ICD-10-CM

## 2018-07-30 NOTE — Progress Notes (Signed)
I placed the orders for a knee scooter and a shower chair per Dr. Logan Bores.  I also sent a request for medical clearance to Dr. Dietrich Pates and to Crosby Oyster, PA.

## 2018-07-31 ENCOUNTER — Telehealth: Payer: Self-pay | Admitting: *Deleted

## 2018-07-31 NOTE — Telephone Encounter (Signed)
Follow Up:    Returning your call, please call him tomorrow  at 1:00,when he is at lunch.

## 2018-07-31 NOTE — Telephone Encounter (Signed)
   [facility] Pre-operative Risk Assessment    Request for surgical clearance:  1. What type of surgery is being performed? Triple Arthrodesis of the right foot  2. When is this surgery scheduled? 09/11/2018  3. What type of clearance is required (medical clearance vs. Pharmacy clearance to hold med vs. Both)? both  4. Are there any medications that need to be held prior to surgery and how long?   5. Practice name and name of physician performing surgery? [facility], DPM  6. What is your office phone number  [phone]   7.   What is your office fax number  [phone]  8.   Anesthesia type (None, local, MAC, general) ?  general   Pam,RN 07/31/2018, 11:28 AM  _________________________________________________________________   (provider comments below)

## 2018-07-31 NOTE — Telephone Encounter (Signed)
Follow up     Patient is returning call in reference to pre-op. He says he goes to lunch at 11am and he is unable to walk around with his phone to call us. We need to work within his schedule. Advised patient no guarantee given.

## 2018-07-31 NOTE — Telephone Encounter (Signed)
   Primary Cardiologist: Dietrich Pates, MD  Chart reviewed as part of pre-operative protocol coverage. Left voice mail to call between pre-op hours.   [place], [name] 07/31/2018, 1:05 PM

## 2018-08-04 NOTE — Telephone Encounter (Signed)
Follow up:    Patient calling about his clearance he would like for some one to call him back.

## 2018-08-04 NOTE — Telephone Encounter (Signed)
Follow up:   Patient also stated that information can be left on his vm.

## 2018-08-05 NOTE — Telephone Encounter (Signed)
   Primary Cardiologist: Dietrich Pates, MD  Chart reviewed as part of pre-operative protocol coverage. Patient was contacted 08/05/2018 in reference to pre-operative risk assessment for pending surgery as outlined below.  Quint Chestnut was last seen on 06/16/18 by Dr. Tenny Craw.  Since that day, Alexander [name] has done well with no chest discomfort or shortness of breath. He had pulmonary embolism with right heart strain but repeat echo on 06/27/18 showed normal RV and LV funciton.  Therefore, based on ACC/AHA guidelines, the patient would be at acceptable risk for the planned procedure without further cardiovascular testing.   I will route this recommendation to the requesting party via Epic fax function and remove from pre-op pool.  Please call with questions.  Berton Bon, NP 08/05/2018, 2:56 PM

## 2018-08-05 NOTE — Telephone Encounter (Signed)
I left message on VM that we do not start preop clinic until 1:30. Please to try to return call for just a few quick questions between 1:30 and 5.

## 2018-08-14 ENCOUNTER — Telehealth: Payer: Self-pay | Admitting: *Deleted
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
## 2018-08-18 NOTE — Telephone Encounter (Signed)
"  I spoke to you last week about speaking to Dr. Logan BoresEvans about putting me into a therapy center after my surgery.  Please call me back with that information."

## 2018-08-20 NOTE — Telephone Encounter (Signed)
"  That girl was supposed to have called me back about getting into rehab and I haven't heard anything back from her."  I'm the person you spoke to previously.  As I mentioned to you previously, you do not meet the criteria to be admitted into the hospital after your surgery.  You would need a three day stay in the hospital in order to get into a rehabilitation facility.  We have no reason to have you admitted.  "So what am I supposed to do?  I live alone?"  We can arrange home health care for you.  "Will they be there seven days a week?"  I cannot guarantee that.  "I will need help washing my clothes, showering, all that good stuff."  You will have a knee scooter to assist you.  "That knee scooter isn't going to be safe if there's snow and ice outside.  I can arrange home care for you but you will have to pay for that out of pocket.  Would you like for me to do that?  "I don't have that type of money."  You can get around with the knee scooter.  "I don't know what to do."

## 2018-09-04 ENCOUNTER — Telehealth: Payer: Self-pay | Admitting: Podiatry

## 2018-09-04 NOTE — Telephone Encounter (Signed)
Dr. Samuella CotaPrice as doctor on call reviewed pt's clinicals of 07/28/2018 and states pt's out of work status due to pain, and begin Monday 09/08/2018.

## 2018-09-04 NOTE — Telephone Encounter (Signed)
"  I am scheduled for surgery on 12/12, and Im in a lot of pain, I would like to have Sunday 12/8 be my last day at work." I want my HR to be notified that 12/8 will be my last day.

## 2018-09-04 NOTE — Telephone Encounter (Signed)
Left message for pt to call with HR contact information for the letter.

## 2018-09-05 ENCOUNTER — Telehealth: Payer: Self-pay | Admitting: Podiatry

## 2018-09-05 ENCOUNTER — Encounter: Payer: Self-pay | Admitting: *Deleted

## 2018-09-05 NOTE — Telephone Encounter (Signed)
Dr. Samuella CotaPrice reviewed Dr. Logan BoresEvans 07/28/2018 clinicals and states he feels my should be out of work beginning 09/08/2018 due to pain, and will have surgey 09/11/2018. Letter faxed to HR - Lucy.

## 2018-09-05 NOTE — Telephone Encounter (Signed)
Left message informing pt the letter he had requested had been sent to HR - Lucy.

## 2018-09-05 NOTE — Telephone Encounter (Signed)
This message answered on 09/05/2018 Patient Call message.

## 2018-09-05 NOTE — Telephone Encounter (Signed)
Please fax letter to [phone] Attn: Valentina GuLucy

## 2018-09-09 ENCOUNTER — Telehealth: Payer: Self-pay | Admitting: Podiatry

## 2018-09-09 DIAGNOSIS — Z9889 Other specified postprocedural states: Secondary | ICD-10-CM

## 2018-09-09 DIAGNOSIS — M19071 Primary osteoarthritis, right ankle and foot: Secondary | ICD-10-CM

## 2018-09-09 NOTE — Telephone Encounter (Signed)
Dr. Logan BoresEvans is giving me a walker, but as far as when I'm in the house I will need crutches. Can he order me some crutches to have to use to get around in the house. Please call me back at [phone] and let me know as soon as possible. Thank you.

## 2018-09-09 NOTE — Telephone Encounter (Signed)
Pt called saying he is scheduled for surgery Thursday and does not have access to pre-register online. Wants to know what time his surgery is and what time he needs to be there, that he is currently out of town. I told him the surgical center calls a day or two prior to the surgery date to let the pt know what time to arrive in case they have any changes to their schedules. I told the pt I had just left him a voicemail to stop taking the Xarelto today and he stated he stopped taking it on Saturday, that when he last saw Dr. Logan BoresEvans, Dr. Logan BoresEvans told him to stop taking it 5 days prior to surgery.

## 2018-09-09 NOTE — Telephone Encounter (Signed)
Left a follow up message letting pt know to stop taking his Xarelto today for his surgery on Thursday, 12 December with Dr. Logan BoresEvans. Asked pt to call back at [phone] to confirm he received voicemail.

## 2018-09-09 NOTE — Addendum Note (Signed)
Addended by: Alphia Kava'CONNELL, VALERY D on: 09/09/2018 02:49 PM   Modules accepted: Orders

## 2018-09-09 NOTE — Telephone Encounter (Signed)
Faxed orders for crutches to [facility] and emailed to A. Baron Hamperrotter.

## 2018-09-10 ENCOUNTER — Telehealth: Payer: Self-pay | Admitting: Podiatry

## 2018-09-10 NOTE — Telephone Encounter (Signed)
Left message informing pt he did not have diagnosis that required HHC, he would be in a surgical foot dressing until his 1st postop check, he would not be driving due to the surgery on his right foot, so he would not be out in the weather, but he would need to have someone drive him to the surgery and postop visits, possibly they could assist with his home chores, or [facility], to call with his decision.

## 2018-09-10 NOTE — Telephone Encounter (Signed)
I'm scheduled to have my surgery with Dr. Logan BoresEvans tomorrow. I need to know the status of the nurse coming to help me. I also need to know how far you have gotten with my short term disability. Please call me as soon as you get this message at [phone].

## 2018-09-11 ENCOUNTER — Other Ambulatory Visit: Payer: Self-pay | Admitting: Podiatry

## 2018-09-11 DIAGNOSIS — M19071 Primary osteoarthritis, right ankle and foot: Secondary | ICD-10-CM | POA: Diagnosis not present

## 2018-09-11 DIAGNOSIS — M7751 Other enthesopathy of right foot: Secondary | ICD-10-CM | POA: Diagnosis not present

## 2018-09-11 DIAGNOSIS — Q6689 Other  specified congenital deformities of feet: Secondary | ICD-10-CM | POA: Diagnosis not present

## 2018-09-11 MED ORDER — OXYCODONE-ACETAMINOPHEN 10-325 MG PO TABS: 1.0000 | ORAL_TABLET | Freq: Four times a day (QID) | ORAL | 0 refills | Status: DC | PRN

## 2018-09-11 NOTE — Progress Notes (Signed)
.  postop

## 2018-09-17 ENCOUNTER — Ambulatory Visit (INDEPENDENT_AMBULATORY_CARE_PROVIDER_SITE_OTHER): Payer: BLUE CROSS/BLUE SHIELD

## 2018-09-17 ENCOUNTER — Ambulatory Visit (INDEPENDENT_AMBULATORY_CARE_PROVIDER_SITE_OTHER): Payer: BLUE CROSS/BLUE SHIELD | Admitting: Podiatry

## 2018-09-17 ENCOUNTER — Telehealth: Payer: Self-pay | Admitting: Podiatry

## 2018-09-17 VITALS — Temp 98.4°F

## 2018-09-17 DIAGNOSIS — Z9889 Other specified postprocedural states: Secondary | ICD-10-CM

## 2018-09-17 DIAGNOSIS — T148XXA Other injury of unspecified body region, initial encounter: Secondary | ICD-10-CM

## 2018-09-17 DIAGNOSIS — M19071 Primary osteoarthritis, right ankle and foot: Secondary | ICD-10-CM

## 2018-09-17 MED ORDER — OXYCODONE-ACETAMINOPHEN 10-325 MG PO TABS: 1.0000 | ORAL_TABLET | Freq: Four times a day (QID) | ORAL | 0 refills | Status: DC | PRN

## 2018-09-17 NOTE — Telephone Encounter (Signed)
Pt called states [facility] can not process Dr. Logan BoresEvans DEA number, pt states he is at the pharmacy now.

## 2018-09-19 ENCOUNTER — Telehealth: Payer: Self-pay | Admitting: Podiatry

## 2018-09-19 ENCOUNTER — Other Ambulatory Visit: Payer: Self-pay | Admitting: Podiatry

## 2018-09-19 MED ORDER — OXYCODONE-ACETAMINOPHEN 10-325 MG PO TABS: 1.0000 | ORAL_TABLET | Freq: Four times a day (QID) | ORAL | 0 refills | Status: DC | PRN

## 2018-09-19 NOTE — Telephone Encounter (Signed)
Please Re-Send this [facility] on [address]. Patient Insurance [facility] not [facility]. This is Urgent. Please Call Patient

## 2018-09-19 NOTE — Telephone Encounter (Signed)
I spoke with patient and informed him that Dr. Evans [facility].

## 2018-09-19 NOTE — Progress Notes (Signed)
.  postop

## 2018-09-21 NOTE — Progress Notes (Signed)
   Subjective:  Patient presents today status post triple arthrodesis right. DOS: 09/11/18. He states he is doing well. He denies any significant pain or modifying factors. He has been using the CAM boot and taking Percocet for pain as directed. Patient is here for further evaluation and treatment.    Past Medical History:  Diagnosis Date  . Allergy   . Arthritis   . Diverticulitis 2012  . Gout 2007  . History of gastroesophageal reflux (GERD)   . Overweight (BMI 25.0-29.9)       Objective/Physical Exam Neurovascular status intact.  Skin incisions appear to be well coapted with sutures and staples intact. No sign of infectious process noted. No dehiscence. No active bleeding noted. Moderate edema noted to the surgical extremity.  Radiographic Exam:  Orthopedic hardware and osteotomies sites appear to be stable with routine healing.  Assessment: 1. s/p triple arthrodesis right. DOS: 09/11/18   Plan of Care:  1. Patient was evaluated. X-rays reviewed 2. JP drain removed.  3. Dry sterile dressing applied.  4. Continue nonweightbearing in CAM boot.  5. Refill prescription for Percocet 10/325 mg provided to patient.  6. Return to clinic for staple removal.    Felecia ShellingBrent M. Madeleyn Schwimmer, DPM [facility]  Dr. Felecia ShellingBrent M. Onix Jumper, DPM    [address]                                        [place], [place] [id number]                Office [phone]  Fax [phone]

## 2018-09-22 ENCOUNTER — Other Ambulatory Visit: Payer: Self-pay | Admitting: Podiatry

## 2018-09-22 ENCOUNTER — Other Ambulatory Visit: Payer: Self-pay

## 2018-09-22 ENCOUNTER — Encounter: Payer: BLUE CROSS/BLUE SHIELD | Admitting: Podiatry

## 2018-09-22 ENCOUNTER — Telehealth: Payer: Self-pay | Admitting: Podiatry

## 2018-09-22 MED ORDER — OXYCODONE-ACETAMINOPHEN 10-325 MG PO TABS: 1.0000 | ORAL_TABLET | Freq: Four times a day (QID) | ORAL | 0 refills | Status: DC | PRN

## 2018-09-22 NOTE — Progress Notes (Signed)
Postop pain medication 

## 2018-09-22 NOTE — Telephone Encounter (Signed)
Pt called stating that he picked up his medication last week but misplaced it this weekend and would like for a refill to be sent in. Please give pt a call.

## 2018-09-22 NOTE — Progress Notes (Signed)
Post op pain 

## 2018-09-22 NOTE — Telephone Encounter (Signed)
Medication refilled just this once

## 2018-09-29 ENCOUNTER — Ambulatory Visit (INDEPENDENT_AMBULATORY_CARE_PROVIDER_SITE_OTHER): Payer: BLUE CROSS/BLUE SHIELD | Admitting: Podiatry

## 2018-09-29 DIAGNOSIS — M19071 Primary osteoarthritis, right ankle and foot: Secondary | ICD-10-CM

## 2018-09-29 DIAGNOSIS — Z9889 Other specified postprocedural states: Secondary | ICD-10-CM

## 2018-09-29 MED ORDER — OXYCODONE-ACETAMINOPHEN 10-325 MG PO TABS: 1.0000 | ORAL_TABLET | Freq: Four times a day (QID) | ORAL | 0 refills | Status: DC | PRN

## 2018-10-02 NOTE — Progress Notes (Signed)
   Subjective:  Patient presents today status post triple arthrodesis right. DOS: 09/11/18. He states he is doing well. He denies any significant pain or modifying factors. He has been using the CAM boot and taking Percocet as directed. Patient is here for further evaluation and treatment.    Past Medical History:  Diagnosis Date  . Allergy   . Arthritis   . Diverticulitis 2012  . Gout 2007  . History of gastroesophageal reflux (GERD)   . Overweight (BMI 25.0-29.9)       Objective/Physical Exam Neurovascular status intact.  Skin incisions appear to be well coapted with sutures and staples intact. No sign of infectious process noted. No dehiscence. No active bleeding noted. Moderate edema noted to the surgical extremity.  Assessment: 1. s/p triple arthrodesis right. DOS: 09/11/18   Plan of Care:  1. Patient was evaluated. 2. Patient does not want to remove staples today. He wants them out next week.  3. Dry sterile dressing applied. Keep clean, dry and intact.  4. Continue nonweightbearing in CAM boot.  5. Refill prescription for Percocet 10/325 mg provided to patient. After today, we will taper the dose down.  6. Return to clinic in one week for staple removal.    Felecia Shelling, DPM [facility]  Dr. Felecia Shelling, DPM    [address]                                        [place], [place] [address]                Office [phone]  Fax [phone]

## 2018-10-13 ENCOUNTER — Ambulatory Visit (INDEPENDENT_AMBULATORY_CARE_PROVIDER_SITE_OTHER): Payer: BLUE CROSS/BLUE SHIELD

## 2018-10-13 ENCOUNTER — Telehealth: Payer: Self-pay | Admitting: *Deleted

## 2018-10-13 ENCOUNTER — Ambulatory Visit (INDEPENDENT_AMBULATORY_CARE_PROVIDER_SITE_OTHER): Payer: BLUE CROSS/BLUE SHIELD | Admitting: Podiatry

## 2018-10-13 DIAGNOSIS — M19071 Primary osteoarthritis, right ankle and foot: Secondary | ICD-10-CM

## 2018-10-13 DIAGNOSIS — Z9889 Other specified postprocedural states: Secondary | ICD-10-CM

## 2018-10-13 MED ORDER — OXYCODONE-ACETAMINOPHEN 10-325 MG PO TABS: 1.0000 | ORAL_TABLET | Freq: Four times a day (QID) | ORAL | 0 refills | Status: DC | PRN

## 2018-10-13 NOTE — Telephone Encounter (Signed)
Pt states he would like [facility] [address].

## 2018-10-13 NOTE — Telephone Encounter (Signed)
Done

## 2018-10-15 NOTE — Progress Notes (Signed)
   Subjective:  Patient presents today status post triple arthrodesis right. DOS: 09/11/18. He states he is doing well. He denies any significant pain or modifying factors. He has been using the CAM boot and taking Percocet as directed. Patient is here for further evaluation and treatment.    Past Medical History:  Diagnosis Date  . Allergy   . Arthritis   . Diverticulitis 2012  . Gout 2007  . History of gastroesophageal reflux (GERD)   . Overweight (BMI 25.0-29.9)       Objective/Physical Exam Neurovascular status intact.  Skin incisions appear to be well coapted with sutures and staples intact. No sign of infectious process noted. No dehiscence. No active bleeding noted. Moderate edema noted to the surgical extremity.  Radiographic Exam:  Orthopedic hardware and osteotomies sites appear to be stable with routine healing.  Assessment: 1. s/p triple arthrodesis right. DOS: 09/11/18   Plan of Care:  1. Patient was evaluated. X-Rays reviewed.  2. Staples removed.  3. Compression anklet dispensed.  4. Continue nonweightbearing in CAM boot for 4 weeks.  5. Refill prescription for Percocet 10/325 mg provided to patient.  6. Return to clinic in 4 weeks.    Felecia Shelling, DPM [facility]  Dr. Felecia Shelling, DPM    [address]                                        [place], [place] [address]                Office [phone]  Fax [phone]

## 2018-11-10 ENCOUNTER — Ambulatory Visit (INDEPENDENT_AMBULATORY_CARE_PROVIDER_SITE_OTHER): Payer: BLUE CROSS/BLUE SHIELD | Admitting: Podiatry

## 2018-11-10 ENCOUNTER — Encounter: Payer: Self-pay | Admitting: Podiatry

## 2018-11-10 DIAGNOSIS — Z9889 Other specified postprocedural states: Secondary | ICD-10-CM

## 2018-11-10 DIAGNOSIS — M19071 Primary osteoarthritis, right ankle and foot: Secondary | ICD-10-CM

## 2018-11-10 MED ORDER — OXYCODONE-ACETAMINOPHEN 10-325 MG PO TABS: 1.0000 | ORAL_TABLET | Freq: Four times a day (QID) | ORAL | 0 refills | Status: DC | PRN

## 2018-11-14 NOTE — Progress Notes (Signed)
   Subjective:  Patient presents today status post triple arthrodesis right. DOS: 09/11/18. He states he is doing well. He denies any significant pain or modifying factors. He does note some swelling of the foot. He has been taking Percocet as needed for pain. Patient is here for further evaluation and treatment.    Past Medical History:  Diagnosis Date  . Allergy   . Arthritis   . Diverticulitis 2012  . Gout 2007  . History of gastroesophageal reflux (GERD)   . Overweight (BMI 25.0-29.9)       Objective/Physical Exam Neurovascular status intact.  Skin incisions appear to be well coapted. No sign of infectious process noted. No dehiscence. No active bleeding noted. Moderate edema noted to the surgical extremity.  Assessment: 1. s/p triple arthrodesis right. DOS: 09/11/18   Plan of Care:  1. Patient was evaluated. 2. Continue weightbearing in CAM boot for three weeks.  3. Transition out of CAM boot into sneakers in three weeks.  4. Refill prescription for Percocet 10/325 mg provided to patient.  5. Return to clinic in 6 weeks.    Felecia Shelling, DPM [facility]  Dr. Felecia Shelling, DPM    [address]                                        [place], [place] [address]                Office [phone]  Fax [phone]

## 2018-11-24 ENCOUNTER — Telehealth: Payer: Self-pay | Admitting: Podiatry

## 2018-11-24 ENCOUNTER — Other Ambulatory Visit: Payer: Self-pay | Admitting: Podiatry

## 2018-11-24 MED ORDER — OXYCODONE-ACETAMINOPHEN 10-325 MG PO TABS: 1.0000 | ORAL_TABLET | Freq: Three times a day (TID) | ORAL | 0 refills | Status: DC | PRN

## 2018-11-24 NOTE — Telephone Encounter (Signed)
Patient called again asking if his medication had been sent in. Checked with doctor and it will be sent in today before end of business. Patient was informed of this.

## 2018-11-24 NOTE — Progress Notes (Signed)
Post op pain 

## 2018-11-24 NOTE — Telephone Encounter (Signed)
Pt called back to make sure medication will be sent

## 2018-11-24 NOTE — Telephone Encounter (Signed)
Pt called asking if his pain medication has been called in yet. He is wanting oxycodone.

## 2018-11-24 NOTE — Telephone Encounter (Signed)
Patient called the office again at 4:58PM wanting to follow up on his prescription.

## 2018-11-24 NOTE — Telephone Encounter (Signed)
Patient needs pain [facility] to [facility] on [address]

## 2018-12-08 ENCOUNTER — Other Ambulatory Visit: Payer: Self-pay | Admitting: Podiatry

## 2018-12-08 ENCOUNTER — Telehealth: Payer: Self-pay | Admitting: Podiatry

## 2018-12-08 MED ORDER — OXYCODONE-ACETAMINOPHEN 10-325 MG PO TABS: 1.0000 | ORAL_TABLET | Freq: Three times a day (TID) | ORAL | 0 refills | Status: DC | PRN

## 2018-12-08 NOTE — Telephone Encounter (Signed)
Pt called requesting refill of Oxycodone be sent in to his pharmacy.  Please [facility] to [facility] [address]

## 2018-12-08 NOTE — Progress Notes (Signed)
Post op

## 2018-12-08 NOTE — Telephone Encounter (Signed)
Forwarding to CMA for follow up. Please send [facility]ill to [facility] on [address]

## 2018-12-09 ENCOUNTER — Other Ambulatory Visit: Payer: Self-pay

## 2018-12-10 ENCOUNTER — Encounter: Payer: Self-pay | Admitting: Podiatry

## 2018-12-22 ENCOUNTER — Ambulatory Visit (INDEPENDENT_AMBULATORY_CARE_PROVIDER_SITE_OTHER): Payer: BLUE CROSS/BLUE SHIELD

## 2018-12-22 ENCOUNTER — Other Ambulatory Visit: Payer: Self-pay

## 2018-12-22 ENCOUNTER — Ambulatory Visit (INDEPENDENT_AMBULATORY_CARE_PROVIDER_SITE_OTHER): Payer: BLUE CROSS/BLUE SHIELD | Admitting: Podiatry

## 2018-12-22 DIAGNOSIS — Z9889 Other specified postprocedural states: Secondary | ICD-10-CM

## 2018-12-22 DIAGNOSIS — M19071 Primary osteoarthritis, right ankle and foot: Secondary | ICD-10-CM

## 2018-12-22 DIAGNOSIS — M79671 Pain in right foot: Secondary | ICD-10-CM

## 2018-12-22 MED ORDER — OXYCODONE-ACETAMINOPHEN 10-325 MG PO TABS: 1.0000 | ORAL_TABLET | Freq: Three times a day (TID) | ORAL | 0 refills | Status: DC | PRN

## 2018-12-22 NOTE — Progress Notes (Signed)
   Subjective:  Patient presents today status post triple arthrodesis right. DOS: 09/11/18. He states he is doing well.  Patient continues to experience some pain with tenderness to ambulation of the surgical extremity.  He has been trying to transition out of the immobilization cam boot into good supportive shoe gear at the moment.  He has been using Percocet for pain management.  Past Medical History:  Diagnosis Date  . Allergy   . Arthritis   . Diverticulitis 2012  . Gout 2007  . History of gastroesophageal reflux (GERD)   . Overweight (BMI 25.0-29.9)       Objective/Physical Exam Neurovascular status intact.  Skin incisions appear to be well coapted. No sign of infectious process noted. No dehiscence. No active bleeding noted. Moderate edema noted to the surgical extremity.  There continues to be some tenderness to palpation along the foot and ankle.  Radiographic exam Compression staple overlying the calcaneocuboid joint appears to have broken at the proximal compression arm.  There also appears to be some degenerative bony changes of the talus.  Arthrodesis sites appear unchanged since last visit with routine healing. orthopedic hardware is otherwise intact.  Assessment: 1. s/p triple arthrodesis right. DOS: 09/11/18   Plan of Care:  1. Patient was evaluated. 2.  Patient may need to return to return to the immobilization cam boot for an additional 3-4 weeks.  Weightbearing as tolerated.  This will allow continued immobilization with healing 3.  Refill prescription for Percocet 10/325 mg.  I did explain to the patient that we do need to taper the patient down off of opioid pain management.  Patient understands.  Next refill prescription will likely be Percocet 5/325 mg followed by anti-inflammatories as needed 4.  Return to clinic in 8 weeks for follow-up evaluation and follow-up x-rays  Felecia Shelling, DPM [facility]  Dr. Felecia Shelling, DPM    [address]                                        [place], [place] [address]                Office [phone]  Fax [phone]

## 2019-01-05 ENCOUNTER — Other Ambulatory Visit: Payer: Self-pay | Admitting: Podiatry

## 2019-01-05 MED ORDER — OXYCODONE-ACETAMINOPHEN 5-325 MG PO TABS: 1.0000 | ORAL_TABLET | Freq: Three times a day (TID) | ORAL | 0 refills | Status: DC | PRN

## 2019-01-05 NOTE — Telephone Encounter (Signed)
Left message for pt to call with a description of his discomfort.

## 2019-01-05 NOTE — Telephone Encounter (Signed)
Pt called requesting a refill on his oxycodone. Pt states he is experiencing some pain this morning and would like for a [facility] [address]

## 2019-01-05 NOTE — Progress Notes (Signed)
Prn pain 

## 2019-01-05 NOTE — Telephone Encounter (Signed)
Left message informing pt Dr. Logan Bores had sent in a pain medication.

## 2019-01-05 NOTE — Telephone Encounter (Signed)
Pt called and states he is still having some swelling and pain, even wearing the brace and the boot as instructed by Dr. Logan Bores. Pt states he was told to call by Dr. Logan Bores if he needed more pain medication and he is out.

## 2019-01-05 NOTE — Telephone Encounter (Signed)
As discussed on last visit... I just sent an Rx for percocet 5/325. No longer the 10/325s. Plan is to taper the patient down and eventually go to anti-inflammatories as needed.

## 2019-01-05 NOTE — Telephone Encounter (Signed)
Pt called to follow up on prescription.

## 2019-01-22 ENCOUNTER — Telehealth: Payer: Self-pay | Admitting: Medical

## 2019-01-22 NOTE — Telephone Encounter (Signed)
Pt states he will call back and set up a  appt

## 2019-01-22 NOTE — Telephone Encounter (Signed)
Please set up for virtual visit med check, follow up from last visit here with me

## 2019-02-02 ENCOUNTER — Telehealth: Payer: Self-pay | Admitting: Podiatry

## 2019-02-02 ENCOUNTER — Other Ambulatory Visit: Payer: Self-pay | Admitting: Podiatry

## 2019-02-02 MED ORDER — TRAMADOL HCL 50 MG PO TABS: 50.0000 mg | ORAL_TABLET | Freq: Three times a day (TID) | ORAL | 0 refills | Status: DC | PRN

## 2019-02-02 NOTE — Telephone Encounter (Signed)
Tramadol sent to pharmacy. Patient knows we need to taper down off of pain medications. He can leap frog Motrin and Tramadol together.

## 2019-02-02 NOTE — Telephone Encounter (Signed)
Left message requesting a call back with the status of his foot pain.

## 2019-02-02 NOTE — Telephone Encounter (Signed)
Pt called and I asked him to describe the sensation he was having and he described it as, "just pain" and it occurs through the day and it helps him sleep. I asked pt if he was able to take ibuprofen and he stated he was, but the oxycodone worked better.

## 2019-02-02 NOTE — Progress Notes (Signed)
PRN foot and ankle pain 

## 2019-02-02 NOTE — Telephone Encounter (Signed)
Pt has called back to follow up on medication re-fill

## 2019-02-02 NOTE — Telephone Encounter (Signed)
Patient is requesting a refill of oxycontin.

## 2019-02-16 ENCOUNTER — Other Ambulatory Visit: Payer: Self-pay

## 2019-02-16 ENCOUNTER — Encounter: Payer: Self-pay | Admitting: Podiatry

## 2019-02-16 ENCOUNTER — Ambulatory Visit (INDEPENDENT_AMBULATORY_CARE_PROVIDER_SITE_OTHER): Payer: BLUE CROSS/BLUE SHIELD

## 2019-02-16 ENCOUNTER — Ambulatory Visit (INDEPENDENT_AMBULATORY_CARE_PROVIDER_SITE_OTHER): Payer: BLUE CROSS/BLUE SHIELD | Admitting: Podiatry

## 2019-02-16 VITALS — Temp 97.3°F

## 2019-02-16 DIAGNOSIS — M19071 Primary osteoarthritis, right ankle and foot: Secondary | ICD-10-CM

## 2019-02-16 DIAGNOSIS — Z9889 Other specified postprocedural states: Secondary | ICD-10-CM

## 2019-02-16 MED ORDER — IBUPROFEN 800 MG PO TABS: 800.0000 mg | ORAL_TABLET | Freq: Three times a day (TID) | ORAL | 2 refills | Status: DC | PRN

## 2019-02-16 NOTE — Progress Notes (Signed)
   Subjective:  Patient presents today status post triple arthrodesis right. DOS: 09/11/18. He states he is doing well.  Patient states that he has some occasional intermittent pain.  He does have some chronic swelling to the right lower extremity as well.  This appears to be chronic in nature.  He has been wearing some compression hose with relief. Past Medical History:  Diagnosis Date  . Allergy   . Arthritis   . Diverticulitis 2012  . Gout 2007  . History of gastroesophageal reflux (GERD)   . Overweight (BMI 25.0-29.9)       Objective/Physical Exam Neurovascular status intact.  Skin incisions appear to be well coapted. Moderate edema noted to the surgical extremity.  Negative for any tenderness to palpation.  Negative for any range of motion to the subtalar, TN, and CC joints.  Radiographic exam Compression staple overlying the calcaneocuboid joint appears to have broken at the proximal compression arm.  There also appears to be some degenerative bony changes of the talus.  Arthrodesis sites appear unchanged since last visit with routine healing. orthopedic hardware is otherwise intact.  Assessment: 1. s/p triple arthrodesis right. DOS: 09/11/18   Plan of Care:  1. Patient was evaluated. 2.  Patient set to return to work.  We will let the patient return to work with restrictions.  Restrictions were provided. 3.  Prescription for Motrin 800 mg every 8 hours 4.  Continue wearing the ankle brace that was dispensed here in the office 5.  Return to clinic in 3 months  *Works at [employer], DPM [facility]  Dr. Felecia Shelling, DPM    [address]                                        [place], [place] [address]                Office [phone]  Fax [phone]

## 2019-02-18 ENCOUNTER — Telehealth: Payer: Self-pay | Admitting: Podiatry

## 2019-03-09 ENCOUNTER — Encounter: Payer: Self-pay | Admitting: Podiatry

## 2019-03-19 ENCOUNTER — Telehealth: Payer: Self-pay | Admitting: Podiatry

## 2019-03-19 ENCOUNTER — Encounter: Payer: Self-pay | Admitting: *Deleted

## 2019-03-19 NOTE — Telephone Encounter (Signed)
Okay for patient to return to work full duty no restrictions. - Dr.Asiel Chrostowski

## 2019-03-19 NOTE — Telephone Encounter (Signed)
I informed pt Dr. Amalia Hailey had contacted me and had released him to go to work without restrictions. Pt states he will pick up the note tomorrow about 8:00am.

## 2019-03-19 NOTE — Telephone Encounter (Signed)
Pt was given a note to return to work with restrictions but his employer is not allowing him to come back to work until there are no restrictions. The pt would like to know if we can give him a new note stating there are no restrictions. Pt would also like to be able to pick up the new note today. Just want to touch base with Dr.Evans to make sure it will be okay to change the note.

## 2019-03-19 NOTE — Telephone Encounter (Signed)
I informed pt today was Dr. Amalia Hailey surgery day, I would send his request to Dr. Amalia Hailey and call once I had received Dr. Amalia Hailey response.

## 2019-04-17 ENCOUNTER — Encounter: Payer: BLUE CROSS/BLUE SHIELD | Admitting: Medical

## 2019-04-20 ENCOUNTER — Ambulatory Visit (INDEPENDENT_AMBULATORY_CARE_PROVIDER_SITE_OTHER): Payer: BC Managed Care – PPO | Admitting: Medical

## 2019-04-20 ENCOUNTER — Encounter: Payer: Self-pay | Admitting: Medical

## 2019-04-20 ENCOUNTER — Other Ambulatory Visit: Payer: Self-pay

## 2019-04-20 VITALS — BP 126/80 | HR 72 | Temp 98.1°F | Resp 16 | Ht 69.0 in | Wt 182.2 lb

## 2019-04-20 DIAGNOSIS — K219 Gastro-esophageal reflux disease without esophagitis: Secondary | ICD-10-CM

## 2019-04-20 DIAGNOSIS — M79671 Pain in right foot: Secondary | ICD-10-CM

## 2019-04-20 DIAGNOSIS — Z1211 Encounter for screening for malignant neoplasm of colon: Secondary | ICD-10-CM | POA: Diagnosis not present

## 2019-04-20 DIAGNOSIS — G8929 Other chronic pain: Secondary | ICD-10-CM

## 2019-04-20 DIAGNOSIS — Z86718 Personal history of other venous thrombosis and embolism: Secondary | ICD-10-CM

## 2019-04-20 DIAGNOSIS — Z125 Encounter for screening for malignant neoplasm of prostate: Secondary | ICD-10-CM | POA: Diagnosis not present

## 2019-04-20 DIAGNOSIS — Z86711 Personal history of pulmonary embolism: Secondary | ICD-10-CM | POA: Insufficient documentation

## 2019-04-20 DIAGNOSIS — E790 Hyperuricemia without signs of inflammatory arthritis and tophaceous disease: Secondary | ICD-10-CM

## 2019-04-20 DIAGNOSIS — Z7189 Other specified counseling: Secondary | ICD-10-CM

## 2019-04-20 DIAGNOSIS — E569 Vitamin deficiency, unspecified: Secondary | ICD-10-CM

## 2019-04-20 DIAGNOSIS — Z7185 Encounter for immunization safety counseling: Secondary | ICD-10-CM

## 2019-04-20 DIAGNOSIS — M25571 Pain in right ankle and joints of right foot: Secondary | ICD-10-CM

## 2019-04-20 DIAGNOSIS — Z0000 Encounter for general adult medical examination without abnormal findings: Secondary | ICD-10-CM

## 2019-04-20 DIAGNOSIS — R252 Cramp and spasm: Secondary | ICD-10-CM

## 2019-04-20 DIAGNOSIS — M21961 Unspecified acquired deformity of right lower leg: Secondary | ICD-10-CM

## 2019-04-20 MED ORDER — ALLOPURINOL 100 MG PO TABS: 100.0000 mg | ORAL_TABLET | Freq: Every day | ORAL | 0 refills | Status: DC
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
## 2019-04-20 NOTE — Patient Instructions (Signed)
Thanks for trusting [place] with your health care and for coming in for a physical today.  Below are some general recommendations I have for you:  Yearly screenings See your eye doctor yearly for routine vision care. See your dentist yearly for routine dental care including hygiene visits twice yearly. See me here yearly for a routine physical and preventative care visit   Specific Concerns today:  . We will refer you for screening colonoscopy . Shingles vaccine:  I recommend you have a shingles vaccine to help prevent shingles or herpes zoster outbreak.   Please call your insurer to inquire about coverage for the Shingrix vaccine given in 2 doses.   Some insurers cover this vaccine after age 52, some cover this after age 66.  If your insurer covers this, then call to schedule appointment to have this vaccine here. . Return for fasting labs   Please follow up yearly for a physical.   Preventative Care for Adults - Male      [place]:  A routine yearly physical is a good way to check in with your primary care provider about your health and preventive screening. It is also an opportunity to share updates about your health and any concerns you have, and receive a thorough all-over exam.   Most health insurance companies pay for at least some preventative services.  Check with your health plan for specific coverages.  WHAT PREVENTATIVE SERVICES DO MEN NEED?  Adult men should have their weight and blood pressure checked regularly.   Men age 15 and older should have their cholesterol levels checked regularly.  Beginning at age 54 and continuing to age 53, men should be screened for colorectal cancer.  Certain people may need continued testing until age 52.  Updating vaccinations is part of preventative care.  Vaccinations help protect against diseases such as the flu.  Osteoporosis is a disease in which the bones lose minerals and strength as we age. Men ages 2 and  over should discuss this with their caregivers  Lab tests are generally done as part of preventative care to screen for anemia and blood disorders, to screen for problems with the kidneys and liver, to screen for bladder problems, to check blood sugar, and to check your cholesterol level.  Preventative services generally include counseling about diet, exercise, avoiding tobacco, drugs, excessive alcohol consumption, and sexually transmitted infections.    GENERAL RECOMMENDATIONS FOR GOOD HEALTH:  Healthy diet:  Eat a variety of foods, including fruit, vegetables, animal or vegetable protein, such as meat, fish, chicken, and eggs, or beans, lentils, tofu, and grains, such as rice.  Drink plenty of water daily.  Decrease saturated fat in the diet, avoid lots of red meat, processed foods, sweets, fast foods, and fried foods.  Exercise:  Aerobic exercise helps maintain good heart health. At least 30-40 minutes of moderate-intensity exercise is recommended. For example, a brisk walk that increases your heart rate and breathing. This should be done on most days of the week.   Find a type of exercise or a variety of exercises that you enjoy so that it becomes a part of your daily life.  Examples are running, walking, swimming, water aerobics, and biking.  For motivation and support, explore group exercise such as aerobic class, spin class, Zumba, Yoga,or  martial arts, etc.    Set exercise goals for yourself, such as a certain weight goal, walk or run in a race such as a 5k walk/run.  Speak  to your primary care provider about exercise goals.  Disease prevention:  If you smoke or chew tobacco, find out from your caregiver how to quit. It can literally save your life, no matter how long you have been a tobacco user. If you do not use tobacco, never begin.   Maintain a healthy diet and normal weight. Increased weight leads to problems with blood pressure and diabetes.   The Body Mass Index or BMI  is a way of measuring how much of your body is fat. Having a BMI above 27 increases the risk of heart disease, diabetes, hypertension, stroke and other problems related to obesity. Your caregiver can help determine your BMI and based on it develop an exercise and dietary program to help you achieve or maintain this important measurement at a healthful level.  High blood pressure causes heart and blood vessel problems.  Persistent high blood pressure should be treated with medicine if weight loss and exercise do not work.   Fat and cholesterol leaves deposits in your arteries that can block them. This causes heart disease and vessel disease elsewhere in your body.  If your cholesterol is found to be high, or if you have heart disease or certain other medical conditions, then you may need to have your cholesterol monitored frequently and be treated with medication.   Ask if you should have a cardiac stress test if your history suggests this. A stress test is a test done on a treadmill that looks for heart disease. This test can find disease prior to there being a problem.  Osteoporosis is a disease in which the bones lose minerals and strength as we age. This can result in serious bone fractures. Risk of osteoporosis can be identified using a bone density scan. Men ages 24 and over should discuss this with their caregivers. Ask your caregiver whether you should be taking a calcium supplement and Vitamin D, to reduce the rate of osteoporosis.   Avoid drinking alcohol in excess (more than two drinks per day).  Avoid use of street drugs. Do not share needles with anyone. Ask for professional help if you need assistance or instructions on stopping the use of alcohol, cigarettes, and/or drugs.  Brush your teeth twice a day with fluoride toothpaste, and floss once a day. Good oral hygiene prevents tooth decay and gum disease. The problems can be painful, unattractive, and can cause other health problems. Visit  your dentist for a routine oral and dental check up and preventive care every 6-12 months.   Look at your skin regularly.  Use a mirror to look at your back. Notify your caregivers of changes in moles, especially if there are changes in shapes, colors, a size larger than a pencil eraser, an irregular border, or development of new moles.  Safety:  Use seatbelts 100% of the time, whether driving or as a passenger.  Use safety devices such as hearing protection if you work in environments with loud noise or significant background noise.  Use safety glasses when doing any work that could send debris in to the eyes.  Use a helmet if you ride a bike or motorcycle.  Use appropriate safety gear for contact sports.  Talk to your caregiver about gun safety.  Use sunscreen with a SPF (or skin protection factor) of 15 or greater.  Lighter skinned people are at a greater risk of skin cancer. Don't forget to also wear sunglasses in order to protect your eyes from too much damaging sunlight.  Damaging sunlight can accelerate cataract formation.   Practice safe sex. Use condoms. Condoms are used for birth control and to help reduce the spread of sexually transmitted infections (or STIs).  Some of the STIs are gonorrhea (the clap), chlamydia, syphilis, trichomonas, herpes, HPV (human papilloma virus) and HIV (human immunodeficiency virus) which causes AIDS. The herpes, HIV and HPV are viral illnesses that have no cure. These can result in disability, cancer and death.   Keep carbon monoxide and smoke detectors in your home functioning at all times. Change the batteries every 6 months or use a model that plugs into the wall.   Vaccinations:  Stay up to date with your tetanus shots and other required immunizations. You should have a booster for tetanus every 10 years. Be sure to get your flu shot every year, since 5%-20% of the [place] population comes down with the flu. The flu vaccine changes each year, so being  vaccinated once is not enough. Get your shot in the fall, before the flu season peaks.   Other vaccines to consider:  Human Papilloma Virus or HPV causes cancer of the cervix, and other infections that can be transmitted from person to person. There is a vaccine for HPV, and males should get immunized between the ages of 58 and 70. It requires a series of 3 shots.   Pneumococcal vaccine to protect against certain types of pneumonia.  This is normally recommended for adults age 58 or older.  However, adults younger than 58 years old with certain underlying conditions such as diabetes, heart or lung disease should also receive the vaccine.  Shingles vaccine to protect against Varicella Zoster if you are older than age 33, or younger than 57 years old with certain underlying illness.  If you have not had the Shingrix vaccine, please call your insurer to inquire about coverage for the Shingrix vaccine given in 2 doses.   Some insurers cover this vaccine after age 56, some cover this after age 46.  If your insurer covers this, then call to schedule appointment to have this vaccine here  Hepatitis A vaccine to protect against a form of infection of the liver by a virus acquired from food.  Hepatitis B vaccine to protect against a form of infection of the liver by a virus acquired from blood or body fluids, particularly if you work in health care.  If you plan to travel internationally, check with your local health department for specific vaccination recommendations.   What should I know about cancer screening? Many types of cancers can be detected early and may often be prevented. Lung Cancer  You should be screened every year for lung cancer if: ? You are a current smoker who has smoked for at least 30 years. ? You are a former smoker who has quit within the past 15 years.  Talk to your health care provider about your screening options, when you should start screening, and how often you should be  screened.  Colorectal Cancer  Routine colorectal cancer screening usually begins at 57 years of age and should be repeated every 5-10 years until you are 57 years old. You may need to be screened more often if early forms of precancerous polyps or small growths are found. Your health care provider may recommend screening at an earlier age if you have risk factors for colon cancer.  Your health care provider may recommend using home test kits to check for hidden blood in the stool.  A small  camera at the end of a tube can be used to examine your colon (sigmoidoscopy or colonoscopy). This checks for the earliest forms of colorectal cancer.  Prostate and Testicular Cancer  Depending on your age and overall health, your health care provider may do certain tests to screen for prostate and testicular cancer.  Talk to your health care provider about any symptoms or concerns you have about testicular or prostate cancer.  Skin Cancer  Check your skin from head to toe regularly.  Tell your health care provider about any new moles or changes in moles, especially if: ? There is a change in a mole's size, shape, or color. ? You have a mole that is larger than a pencil eraser.  Always use sunscreen. Apply sunscreen liberally and repeat throughout the day.  Protect yourself by wearing long sleeves, pants, a wide-brimmed hat, and sunglasses when outside.

## 2019-04-27 ENCOUNTER — Other Ambulatory Visit: Payer: Self-pay

## 2019-04-27 ENCOUNTER — Other Ambulatory Visit (INDEPENDENT_AMBULATORY_CARE_PROVIDER_SITE_OTHER): Payer: BC Managed Care – PPO

## 2019-04-27 DIAGNOSIS — Z125 Encounter for screening for malignant neoplasm of prostate: Secondary | ICD-10-CM

## 2019-04-27 DIAGNOSIS — R252 Cramp and spasm: Secondary | ICD-10-CM

## 2019-04-27 DIAGNOSIS — Z23 Encounter for immunization: Secondary | ICD-10-CM | POA: Diagnosis not present

## 2019-04-27 DIAGNOSIS — Z0000 Encounter for general adult medical examination without abnormal findings: Secondary | ICD-10-CM

## 2019-04-27 DIAGNOSIS — E790 Hyperuricemia without signs of inflammatory arthritis and tophaceous disease: Secondary | ICD-10-CM

## 2019-04-28 LAB — COMPREHENSIVE METABOLIC PANEL
ALT: 38 IU/L (ref 0–44)
AST: 41 IU/L — ABNORMAL HIGH (ref 0–40)
Albumin/Globulin Ratio: 1.6 (ref 1.2–2.2)
Albumin: 4.2 g/dL (ref 3.8–4.9)
Alkaline Phosphatase: 82 IU/L (ref 39–117)
BUN/Creatinine Ratio: 19 (ref 9–20)
BUN: 18 mg/dL (ref 6–24)
Bilirubin Total: 0.6 mg/dL (ref 0.0–1.2)
CO2: 22 mmol/L (ref 20–29)
Calcium: 9.2 mg/dL (ref 8.7–10.2)
Chloride: 104 mmol/L (ref 96–106)
Creatinine, Ser: 0.93 mg/dL (ref 0.76–1.27)
GFR calc Af Amer: 105 mL/min/{1.73_m2} (ref >59)
GFR calc non Af Amer: 91 mL/min/{1.73_m2} (ref >59)
Globulin, Total: 2.7 g/dL (ref 1.5–4.5)
Glucose: 79 mg/dL (ref 65–99)
Potassium: 4.4 mmol/L (ref 3.5–5.2)
Sodium: 141 mmol/L (ref 134–144)
Total Protein: 6.9 g/dL (ref 6.0–8.5)

## 2019-04-28 LAB — MAGNESIUM: Magnesium: 2.2 mg/dL (ref 1.6–2.3)

## 2019-04-28 LAB — CBC WITH DIFFERENTIAL/PLATELET
Basophils Absolute: 0 10*3/uL (ref 0.0–0.2)
Basos: 0 %
EOS (ABSOLUTE): 0.1 10*3/uL (ref 0.0–0.4)
Eos: 3 %
Hematocrit: 40.1 % (ref 37.5–51.0)
Hemoglobin: 14 g/dL (ref 13.0–17.7)
Immature Grans (Abs): 0 10*3/uL (ref 0.0–0.1)
Immature Granulocytes: 0 %
Lymphocytes Absolute: 1.4 10*3/uL (ref 0.7–3.1)
Lymphs: 29 %
MCH: 35.7 pg — ABNORMAL HIGH (ref 26.6–33.0)
MCHC: 34.9 g/dL (ref 31.5–35.7)
MCV: 102 fL — ABNORMAL HIGH (ref 79–97)
Monocytes Absolute: 0.8 10*3/uL (ref 0.1–0.9)
Monocytes: 17 %
Neutrophils Absolute: 2.4 10*3/uL (ref 1.4–7.0)
Neutrophils: 51 %
Platelets: 277 10*3/uL (ref 150–450)
RBC: 3.92 x10E6/uL — ABNORMAL LOW (ref 4.14–5.80)
RDW: 13.1 % (ref 11.6–15.4)
WBC: 4.7 10*3/uL (ref 3.4–10.8)

## 2019-04-28 LAB — LIPID PANEL
Chol/HDL Ratio: 1.6 ratio (ref 0.0–5.0)
Cholesterol, Total: 133 mg/dL (ref 100–199)
HDL: 83 mg/dL (ref >39)
LDL Calculated: 37 mg/dL (ref 0–99)
Triglycerides: 63 mg/dL (ref 0–149)
VLDL Cholesterol Cal: 13 mg/dL (ref 5–40)

## 2019-04-28 LAB — URIC ACID: Uric Acid: 4.4 mg/dL (ref 3.7–8.6)

## 2019-04-28 LAB — PSA: Prostate Specific Ag, Serum: 3.1 ng/mL (ref 0.0–4.0)

## 2019-05-02 LAB — SPECIMEN STATUS REPORT

## 2019-05-02 LAB — VITAMIN B12: Vitamin B-12: 345 pg/mL (ref 232–1245)

## 2019-05-06 ENCOUNTER — Telehealth: Payer: Self-pay | Admitting: Medical

## 2019-05-06 NOTE — Telephone Encounter (Signed)
Left detailed message on voicemail per patient request.  °

## 2019-05-06 NOTE — Telephone Encounter (Signed)
Pt called back. He states to leave him a detailed message due to work schedule

## 2019-05-18 ENCOUNTER — Encounter: Payer: Self-pay | Admitting: Podiatry

## 2019-05-18 ENCOUNTER — Ambulatory Visit (INDEPENDENT_AMBULATORY_CARE_PROVIDER_SITE_OTHER): Payer: BC Managed Care – PPO | Admitting: Podiatry

## 2019-05-18 ENCOUNTER — Other Ambulatory Visit: Payer: Self-pay

## 2019-05-18 ENCOUNTER — Other Ambulatory Visit: Payer: Self-pay | Admitting: Podiatry

## 2019-05-18 ENCOUNTER — Ambulatory Visit (INDEPENDENT_AMBULATORY_CARE_PROVIDER_SITE_OTHER): Payer: BC Managed Care – PPO

## 2019-05-18 VITALS — Temp 97.3°F

## 2019-05-18 DIAGNOSIS — M659 Synovitis and tenosynovitis, unspecified: Secondary | ICD-10-CM

## 2019-05-18 DIAGNOSIS — M19071 Primary osteoarthritis, right ankle and foot: Secondary | ICD-10-CM

## 2019-05-18 DIAGNOSIS — M65171 Other infective (teno)synovitis, right ankle and foot: Secondary | ICD-10-CM

## 2019-05-18 DIAGNOSIS — M65971 Unspecified synovitis and tenosynovitis, right ankle and foot: Secondary | ICD-10-CM

## 2019-05-18 MED ORDER — OXYCODONE-ACETAMINOPHEN 5-325 MG PO TABS: 1.0000 | ORAL_TABLET | Freq: Every day | ORAL | 0 refills | Status: DC

## 2019-05-18 MED ORDER — IBUPROFEN 800 MG PO TABS: 800.0000 mg | ORAL_TABLET | Freq: Three times a day (TID) | ORAL | 2 refills | Status: DC | PRN
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
## 2019-06-10 ENCOUNTER — Telehealth: Payer: Self-pay | Admitting: Podiatry

## 2019-06-10 NOTE — Telephone Encounter (Signed)
Pt is requesting a refill of the oxycodone. Uses [facility] [phone].

## 2019-06-11 ENCOUNTER — Telehealth: Payer: Self-pay | Admitting: Podiatry

## 2019-06-11 NOTE — Telephone Encounter (Signed)
Pt is calling back about a refill on his medication. This is the 2nd time he has called.

## 2019-06-12 ENCOUNTER — Other Ambulatory Visit: Payer: BC Managed Care – PPO

## 2019-06-12 ENCOUNTER — Telehealth: Payer: Self-pay | Admitting: Medical

## 2019-06-12 ENCOUNTER — Other Ambulatory Visit: Payer: Self-pay | Admitting: Medical

## 2019-06-12 DIAGNOSIS — E569 Vitamin deficiency, unspecified: Secondary | ICD-10-CM | POA: Insufficient documentation

## 2019-06-12 MED ORDER — ALLOPURINOL 100 MG PO TABS: 100.0000 mg | ORAL_TABLET | Freq: Every day | ORAL | 0 refills | Status: DC

## 2019-06-12 NOTE — Telephone Encounter (Signed)
Pt called and cancelled his appt today due to cold symptoms. He also having a gout flair up. He states he has 2 pills left. He is requesting refills be sent to [facility] on [address]. Pt can be reached at [phone].

## 2019-06-15 ENCOUNTER — Other Ambulatory Visit: Payer: Self-pay | Admitting: Podiatry

## 2019-06-15 NOTE — Telephone Encounter (Signed)
Please tell patient it is too early for a refill. Last Rx was #60 on 05/18/2019. Instructions to take 1 tablet daily according to our conversation in the office. No refill until 07/18/2019. - Dr. Amalia Hailey

## 2019-06-15 NOTE — Telephone Encounter (Signed)
Too early for refill. Last Rx on 05/18/2019 was #60 with instructions to take 1 tablet daily based on our conversation in the office. No refill until 07/18/2019. - Dr. Amalia Hailey

## 2019-06-16 NOTE — Telephone Encounter (Signed)
I left message for patient to call back to discuss.

## 2019-06-17 NOTE — Telephone Encounter (Signed)
Spoke with patient and informed him of Dr. Amalia Hailey instructions for not refilling again until 07/18/2019.  Patient abruptly hung up after  I informed him of instructions.

## 2019-06-22 ENCOUNTER — Encounter: Payer: Self-pay | Admitting: Medical

## 2019-07-20 ENCOUNTER — Telehealth: Payer: Self-pay | Admitting: Podiatry

## 2019-07-20 NOTE — Telephone Encounter (Signed)
Pt is scheduled for a follow up appt on 11/16 and would like to know if the doctor can go ahead and send in a refill on his pain medication, oxycodone.

## 2019-07-21 ENCOUNTER — Telehealth: Payer: Self-pay | Admitting: Podiatry

## 2019-07-21 NOTE — Telephone Encounter (Signed)
refill the tramadol

## 2019-07-22 ENCOUNTER — Other Ambulatory Visit: Payer: Self-pay | Admitting: Podiatry

## 2019-07-22 MED ORDER — TRAMADOL HCL 50 MG PO TABS: 50.0000 mg | ORAL_TABLET | Freq: Three times a day (TID) | ORAL | 0 refills | Status: DC | PRN

## 2019-07-22 MED ORDER — OXYCODONE-ACETAMINOPHEN 5-325 MG PO TABS: 1.0000 | ORAL_TABLET | Freq: Every day | ORAL | 0 refills | Status: DC

## 2019-07-22 NOTE — Telephone Encounter (Signed)
Left message with orders for Tramadol. 

## 2019-07-22 NOTE — Telephone Encounter (Signed)
Left message informing pt of Dr. Amalia Hailey Tramadol orders.

## 2019-07-22 NOTE — Telephone Encounter (Signed)
Okay to refill tramadol. Dr. Amalia Hailey

## 2019-07-22 NOTE — Addendum Note (Signed)
Addended by: Harriett Sine D on: 07/22/2019 12:25 PM   Modules accepted: Orders

## 2019-07-24 ENCOUNTER — Other Ambulatory Visit: Payer: BC Managed Care – PPO

## 2019-07-24 ENCOUNTER — Telehealth: Payer: Self-pay | Admitting: Medical

## 2019-07-24 ENCOUNTER — Other Ambulatory Visit: Payer: Self-pay | Admitting: Medical

## 2019-07-24 NOTE — Telephone Encounter (Signed)
Looks like someone just called in something 07/22/19

## 2019-07-24 NOTE — Telephone Encounter (Signed)
Pt called and states that he has a gout flair up and is requesting medication. He states he a visit concerning this 2 months ago. Pt uses [facility] on [address] and can be reached at [phone].

## 2019-08-07 ENCOUNTER — Telehealth: Payer: Self-pay | Admitting: Medical

## 2019-08-07 NOTE — Telephone Encounter (Signed)
Pt called for refills of allopurinol. Please send to [facility]. Pt can be reached at [phone].

## 2019-08-07 NOTE — Telephone Encounter (Signed)
Patient was informed a 90 day supply was sent on 06/12/19 and to get from pharmacy.

## 2019-08-17 ENCOUNTER — Other Ambulatory Visit: Payer: Self-pay

## 2019-08-17 ENCOUNTER — Encounter: Payer: Self-pay | Admitting: Podiatry

## 2019-08-17 ENCOUNTER — Ambulatory Visit (INDEPENDENT_AMBULATORY_CARE_PROVIDER_SITE_OTHER): Payer: BC Managed Care – PPO | Admitting: Podiatry

## 2019-08-17 DIAGNOSIS — M659 Synovitis and tenosynovitis, unspecified: Secondary | ICD-10-CM | POA: Diagnosis not present

## 2019-08-17 DIAGNOSIS — M19071 Primary osteoarthritis, right ankle and foot: Secondary | ICD-10-CM

## 2019-08-17 MED ORDER — OXYCODONE-ACETAMINOPHEN 10-325 MG PO TABS: 1.0000 | ORAL_TABLET | Freq: Two times a day (BID) | ORAL | 0 refills | Status: AC | PRN
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
## 2019-09-01 DIAGNOSIS — I739 Peripheral vascular disease, unspecified: Secondary | ICD-10-CM

## 2019-09-01 DIAGNOSIS — I2699 Other pulmonary embolism without acute cor pulmonale: Secondary | ICD-10-CM

## 2019-09-01 DIAGNOSIS — I700 Atherosclerosis of aorta: Secondary | ICD-10-CM

## 2019-09-01 HISTORY — DX: Peripheral vascular disease, unspecified: I73.9

## 2019-09-01 HISTORY — DX: Other pulmonary embolism without acute cor pulmonale: I26.99

## 2019-09-01 HISTORY — DX: Atherosclerosis of aorta: I70.0

## 2019-09-28 ENCOUNTER — Encounter (HOSPITAL_COMMUNITY): Payer: Self-pay | Admitting: Emergency Medicine

## 2019-09-28 ENCOUNTER — Inpatient Hospital Stay (HOSPITAL_COMMUNITY)
Admission: EM | Admit: 2019-09-28 | Discharge: 2019-10-01 | DRG: 176 | Disposition: A | Discharge status: Home / Self Care | Payer: BC Managed Care – PPO | Attending: Internal Medicine | Admitting: Internal Medicine

## 2019-09-28 ENCOUNTER — Emergency Department (HOSPITAL_COMMUNITY): Payer: BC Managed Care – PPO

## 2019-09-28 ENCOUNTER — Other Ambulatory Visit: Payer: Self-pay

## 2019-09-28 DIAGNOSIS — I2693 Single subsegmental pulmonary embolism without acute cor pulmonale: Principal | ICD-10-CM | POA: Diagnosis present

## 2019-09-28 DIAGNOSIS — Z86718 Personal history of other venous thrombosis and embolism: Secondary | ICD-10-CM | POA: Diagnosis not present

## 2019-09-28 DIAGNOSIS — I2782 Chronic pulmonary embolism: Secondary | ICD-10-CM | POA: Diagnosis present

## 2019-09-28 DIAGNOSIS — I159 Secondary hypertension, unspecified: Secondary | ICD-10-CM | POA: Diagnosis not present

## 2019-09-28 DIAGNOSIS — I2699 Other pulmonary embolism without acute cor pulmonale: Secondary | ICD-10-CM

## 2019-09-28 DIAGNOSIS — I82451 Acute embolism and thrombosis of right peroneal vein: Secondary | ICD-10-CM | POA: Diagnosis present

## 2019-09-28 DIAGNOSIS — M109 Gout, unspecified: Secondary | ICD-10-CM

## 2019-09-28 DIAGNOSIS — I82409 Acute embolism and thrombosis of unspecified deep veins of unspecified lower extremity: Secondary | ICD-10-CM

## 2019-09-28 DIAGNOSIS — K219 Gastro-esophageal reflux disease without esophagitis: Secondary | ICD-10-CM

## 2019-09-28 DIAGNOSIS — I10 Essential (primary) hypertension: Secondary | ICD-10-CM

## 2019-09-28 DIAGNOSIS — I2694 Multiple subsegmental pulmonary emboli without acute cor pulmonale: Secondary | ICD-10-CM

## 2019-09-28 DIAGNOSIS — J189 Pneumonia, unspecified organism: Secondary | ICD-10-CM

## 2019-09-28 DIAGNOSIS — I82441 Acute embolism and thrombosis of right tibial vein: Secondary | ICD-10-CM | POA: Diagnosis present

## 2019-09-28 DIAGNOSIS — M1A9XX0 Chronic gout, unspecified, without tophus (tophi): Secondary | ICD-10-CM

## 2019-09-28 DIAGNOSIS — R0781 Pleurodynia: Secondary | ICD-10-CM

## 2019-09-28 DIAGNOSIS — Z20828 Contact with and (suspected) exposure to other viral communicable diseases: Secondary | ICD-10-CM | POA: Diagnosis present

## 2019-09-28 HISTORY — DX: Other pulmonary embolism without acute cor pulmonale: I26.99

## 2019-09-28 LAB — URINALYSIS, ROUTINE W REFLEX MICROSCOPIC
Bilirubin Urine: NEGATIVE
Glucose, UA: NEGATIVE mg/dL
Hgb urine dipstick: NEGATIVE
Ketones, ur: 20 mg/dL — AB
Leukocytes,Ua: NEGATIVE
Nitrite: NEGATIVE
Protein, ur: NEGATIVE mg/dL
Specific Gravity, Urine: 1.014 (ref 1.005–1.030)
pH: 5 (ref 5.0–8.0)

## 2019-09-28 LAB — TROPONIN I (HIGH SENSITIVITY)
Troponin I (High Sensitivity): 3 ng/L (ref <18)
Troponin I (High Sensitivity): 2 ng/L (ref <18)

## 2019-09-28 LAB — RESPIRATORY PANEL BY RT PCR (FLU A&B, COVID)
Influenza A by PCR: NEGATIVE
Influenza B by PCR: NEGATIVE
SARS Coronavirus 2 by RT PCR: NEGATIVE

## 2019-09-28 LAB — COMPREHENSIVE METABOLIC PANEL
ALT: 23 U/L (ref 0–44)
AST: 24 U/L (ref 15–41)
Albumin: 4.2 g/dL (ref 3.5–5.0)
Alkaline Phosphatase: 88 U/L (ref 38–126)
Anion gap: 12 (ref 5–15)
BUN: 10 mg/dL (ref 6–20)
CO2: 25 mmol/L (ref 22–32)
Calcium: 9.2 mg/dL (ref 8.9–10.3)
Chloride: 103 mmol/L (ref 98–111)
Creatinine, Ser: 0.78 mg/dL (ref 0.61–1.24)
GFR calc Af Amer: >60 mL/min (ref >60)
GFR calc non Af Amer: >60 mL/min (ref >60)
Glucose, Bld: 82 mg/dL (ref 70–99)
Potassium: 4.1 mmol/L (ref 3.5–5.1)
Sodium: 140 mmol/L (ref 135–145)
Total Bilirubin: 1.1 mg/dL (ref 0.3–1.2)
Total Protein: 8.3 g/dL — ABNORMAL HIGH (ref 6.5–8.1)

## 2019-09-28 LAB — CBC
HCT: 46.8 % (ref 39.0–52.0)
Hemoglobin: 15.2 g/dL (ref 13.0–17.0)
MCH: 33.5 pg (ref 26.0–34.0)
MCHC: 32.5 g/dL (ref 30.0–36.0)
MCV: 103.1 fL — ABNORMAL HIGH (ref 80.0–100.0)
Platelets: 214 10*3/uL (ref 150–400)
RBC: 4.54 MIL/uL (ref 4.22–5.81)
RDW: 12.9 % (ref 11.5–15.5)
WBC: 8.4 10*3/uL (ref 4.0–10.5)
nRBC: 0 % (ref 0.0–0.2)

## 2019-09-28 LAB — LIPASE, BLOOD: Lipase: 22 U/L (ref 11–51)

## 2019-09-28 LAB — POC SARS CORONAVIRUS 2 AG -  ED: SARS Coronavirus 2 Ag: NEGATIVE

## 2019-09-28 MED ORDER — BISACODYL 5 MG PO TBEC: 5.0000 mg | DELAYED_RELEASE_TABLET | Freq: Every day | ORAL | Status: DC | PRN

## 2019-09-28 MED ORDER — TRAMADOL HCL 50 MG PO TABS
50.0000 mg | ORAL_TABLET | Freq: Three times a day (TID) | ORAL | Status: DC | PRN
  Administered 2019-09-28: 50 mg via ORAL
  Filled 2019-09-28: qty 1

## 2019-09-28 MED ORDER — HYDRALAZINE HCL 20 MG/ML IJ SOLN: 5.0000 mg | Freq: Four times a day (QID) | INTRAMUSCULAR | Status: DC | PRN

## 2019-09-28 MED ORDER — PANTOPRAZOLE SODIUM 20 MG PO TBEC
20.0000 mg | DELAYED_RELEASE_TABLET | Freq: Every day | ORAL | Status: DC
  Administered 2019-09-28 – 2019-09-30 (×3): 20 mg via ORAL
  Filled 2019-09-28 (×4): qty 1

## 2019-09-28 MED ORDER — SODIUM CHLORIDE (PF) 0.9 % IJ SOLN
INTRAMUSCULAR | Status: AC
  Filled 2019-09-28: qty 50

## 2019-09-28 MED ORDER — IOHEXOL 350 MG/ML SOLN
100.0000 mL | Freq: Once | INTRAVENOUS | Status: AC | PRN
  Administered 2019-09-28: 100 mL via INTRAVENOUS

## 2019-09-28 MED ORDER — SODIUM CHLORIDE 0.9 % IV SOLN
500.0000 mg | Freq: Once | INTRAVENOUS | Status: AC
  Administered 2019-09-28: 500 mg via INTRAVENOUS
  Filled 2019-09-28: qty 500

## 2019-09-28 MED ORDER — HYDROMORPHONE HCL 1 MG/ML IJ SOLN
1.0000 mg | Freq: Once | INTRAMUSCULAR | Status: AC
  Administered 2019-09-28: 1 mg via INTRAVENOUS
  Filled 2019-09-28: qty 1

## 2019-09-28 MED ORDER — ACETAMINOPHEN 325 MG PO TABS
650.0000 mg | ORAL_TABLET | Freq: Four times a day (QID) | ORAL | Status: DC | PRN
  Administered 2019-09-29: 650 mg via ORAL
  Filled 2019-09-28: qty 2

## 2019-09-28 MED ORDER — SENNOSIDES-DOCUSATE SODIUM 8.6-50 MG PO TABS
1.0000 | ORAL_TABLET | Freq: Every evening | ORAL | Status: DC | PRN
  Administered 2019-09-30: 1 via ORAL
  Filled 2019-09-28: qty 1

## 2019-09-28 MED ORDER — SODIUM CHLORIDE 0.9% FLUSH
3.0000 mL | Freq: Once | INTRAVENOUS | Status: AC
  Administered 2019-09-28: 3 mL via INTRAVENOUS

## 2019-09-28 MED ORDER — ACETAMINOPHEN 650 MG RE SUPP: 650.0000 mg | Freq: Four times a day (QID) | RECTAL | Status: DC | PRN

## 2019-09-28 MED ORDER — HEPARIN BOLUS VIA INFUSION
4000.0000 [IU] | Freq: Once | INTRAVENOUS | Status: AC
  Administered 2019-09-28: 4000 [IU] via INTRAVENOUS
  Filled 2019-09-28: qty 4000

## 2019-09-28 MED ORDER — SODIUM CHLORIDE 0.9 % IV BOLUS
1000.0000 mL | Freq: Once | INTRAVENOUS | Status: AC
  Administered 2019-09-28: 1000 mL via INTRAVENOUS

## 2019-09-28 MED ORDER — HEPARIN (PORCINE) 25000 UT/250ML-% IV SOLN
1500.0000 [IU]/h | INTRAVENOUS | Status: DC
  Administered 2019-09-28 – 2019-09-29 (×2): 1500 [IU]/h via INTRAVENOUS
  Filled 2019-09-28 (×2): qty 250

## 2019-09-28 MED ORDER — FENTANYL CITRATE (PF) 100 MCG/2ML IJ SOLN
25.0000 ug | Freq: Four times a day (QID) | INTRAMUSCULAR | Status: DC | PRN
  Administered 2019-09-28: 25 ug via INTRAVENOUS
  Filled 2019-09-28: qty 2

## 2019-09-28 MED ORDER — SODIUM CHLORIDE 0.9 % IV SOLN
1.0000 g | Freq: Once | INTRAVENOUS | Status: AC
  Administered 2019-09-28: 1 g via INTRAVENOUS
  Filled 2019-09-28: qty 10

## 2019-09-28 MED ORDER — ALLOPURINOL 100 MG PO TABS
100.0000 mg | ORAL_TABLET | Freq: Every day | ORAL | Status: DC
  Administered 2019-09-28 – 2019-09-30 (×3): 100 mg via ORAL
  Filled 2019-09-28 (×3): qty 1
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
## 2019-09-28 NOTE — ED Notes (Signed)
VASC US at bedside.

## 2019-09-28 NOTE — ED Notes (Signed)
Per [place], [place] patient may eat and drink.   Patient given grape juice.

## 2019-09-28 NOTE — ED Notes (Signed)
Patient given meal tray.

## 2019-09-28 NOTE — ED Notes (Signed)
Pt stated unable to urinate at this time, will monitor. 

## 2019-09-28 NOTE — Progress Notes (Signed)
Right lower extremity venous duplex completed. Refer to "CV Proc" under chart review to view preliminary results.  09/28/2019 3:12 PM Kelby Aline., MHA, RVT, RDCS, RDMS

## 2019-09-28 NOTE — ED Triage Notes (Addendum)
Pt complaint of left sided abdominal pain onset yesterday with "unable to poop." Pt denies n/v/d. Pt verbalizes small SOB with exertion; denies CP.

## 2019-09-28 NOTE — ED Notes (Signed)
Pt unable to void at this time, will monitor.

## 2019-09-28 NOTE — ED Notes (Signed)
Attempted to call report. RN unavailable at this time. Will call back in 10 minutes.
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
## 2019-09-29 ENCOUNTER — Other Ambulatory Visit: Payer: Self-pay

## 2019-09-29 ENCOUNTER — Observation Stay (HOSPITAL_COMMUNITY): Payer: BC Managed Care – PPO

## 2019-09-29 DIAGNOSIS — I2782 Chronic pulmonary embolism: Secondary | ICD-10-CM | POA: Diagnosis present

## 2019-09-29 DIAGNOSIS — I2699 Other pulmonary embolism without acute cor pulmonale: Secondary | ICD-10-CM | POA: Diagnosis present

## 2019-09-29 DIAGNOSIS — M109 Gout, unspecified: Secondary | ICD-10-CM | POA: Diagnosis present

## 2019-09-29 DIAGNOSIS — I10 Essential (primary) hypertension: Secondary | ICD-10-CM | POA: Diagnosis present

## 2019-09-29 DIAGNOSIS — I82451 Acute embolism and thrombosis of right peroneal vein: Secondary | ICD-10-CM | POA: Diagnosis present

## 2019-09-29 DIAGNOSIS — K219 Gastro-esophageal reflux disease without esophagitis: Secondary | ICD-10-CM | POA: Diagnosis present

## 2019-09-29 DIAGNOSIS — I2693 Single subsegmental pulmonary embolism without acute cor pulmonale: Secondary | ICD-10-CM | POA: Diagnosis present

## 2019-09-29 DIAGNOSIS — M1A9XX0 Chronic gout, unspecified, without tophus (tophi): Secondary | ICD-10-CM | POA: Diagnosis not present

## 2019-09-29 DIAGNOSIS — Z20828 Contact with and (suspected) exposure to other viral communicable diseases: Secondary | ICD-10-CM | POA: Diagnosis present

## 2019-09-29 DIAGNOSIS — R0781 Pleurodynia: Secondary | ICD-10-CM | POA: Diagnosis not present

## 2019-09-29 DIAGNOSIS — I82441 Acute embolism and thrombosis of right tibial vein: Secondary | ICD-10-CM | POA: Diagnosis present

## 2019-09-29 DIAGNOSIS — I2692 Saddle embolus of pulmonary artery without acute cor pulmonale: Secondary | ICD-10-CM

## 2019-09-29 HISTORY — DX: Other pulmonary embolism without acute cor pulmonale: I26.99

## 2019-09-29 LAB — HEPARIN LEVEL (UNFRACTIONATED)
Heparin Unfractionated: 0.39 IU/mL (ref 0.30–0.70)
Heparin Unfractionated: 0.49 IU/mL (ref 0.30–0.70)

## 2019-09-29 LAB — CBC
HCT: 42.6 % (ref 39.0–52.0)
Hemoglobin: 14 g/dL (ref 13.0–17.0)
MCH: 34 pg (ref 26.0–34.0)
MCHC: 32.9 g/dL (ref 30.0–36.0)
MCV: 103.4 fL — ABNORMAL HIGH (ref 80.0–100.0)
Platelets: 198 10*3/uL (ref 150–400)
RBC: 4.12 MIL/uL — ABNORMAL LOW (ref 4.22–5.81)
RDW: 12.8 % (ref 11.5–15.5)
WBC: 11.3 10*3/uL — ABNORMAL HIGH (ref 4.0–10.5)
nRBC: 0 % (ref 0.0–0.2)

## 2019-09-29 LAB — ECHOCARDIOGRAM COMPLETE
Height: 69 in
Weight: 2987.67 oz

## 2019-09-29 LAB — HIV ANTIBODY (ROUTINE TESTING W REFLEX): HIV Screen 4th Generation wRfx: NONREACTIVE

## 2019-09-29 MED ORDER — METOPROLOL TARTRATE 25 MG PO TABS
25.0000 mg | ORAL_TABLET | Freq: Two times a day (BID) | ORAL | Status: DC
  Administered 2019-09-29 – 2019-09-30 (×3): 25 mg via ORAL
  Filled 2019-09-29 (×3): qty 1

## 2019-09-29 MED ORDER — APIXABAN 5 MG PO TABS
10.0000 mg | ORAL_TABLET | Freq: Two times a day (BID) | ORAL | Status: DC
  Administered 2019-09-29 – 2019-09-30 (×4): 10 mg via ORAL
  Filled 2019-09-29 (×4): qty 2

## 2019-09-29 MED ORDER — OXYCODONE HCL 5 MG PO TABS
10.0000 mg | ORAL_TABLET | Freq: Four times a day (QID) | ORAL | Status: DC | PRN
  Administered 2019-09-29 – 2019-10-01 (×6): 10 mg via ORAL
  Filled 2019-09-29 (×7): qty 2

## 2019-09-29 MED ORDER — APIXABAN 5 MG PO TABS: 5.0000 mg | ORAL_TABLET | Freq: Two times a day (BID) | ORAL | Status: DC

## 2019-09-29 MED ORDER — HYDROMORPHONE HCL 1 MG/ML IJ SOLN
0.5000 mg | INTRAMUSCULAR | Status: DC | PRN
  Administered 2019-09-29 – 2019-09-30 (×7): 0.5 mg via INTRAVENOUS
  Filled 2019-09-29 (×8): qty 0.5

## 2019-09-29 MED ORDER — ZOLPIDEM TARTRATE 5 MG PO TABS
5.0000 mg | ORAL_TABLET | Freq: Every evening | ORAL | Status: DC | PRN
  Administered 2019-09-29 – 2019-09-30 (×2): 5 mg via ORAL
  Filled 2019-09-29 (×2): qty 1

## 2019-09-29 NOTE — Progress Notes (Signed)
MEWS score now yellow.

## 2019-09-29 NOTE — Progress Notes (Signed)
Pt was still c/o a lot of pain on left side of abdomen. Paged [facility] floor coverage to request different meds for pain.  Orders put in by Lang Snow, NP  (IV Dilaudid 0.5 mg q4 hours for severe pain and oral Oxycodone 10 mg for moderate pain q6 hours). 0.5 mg Dilaudid given IV. Will continue to monitor pt.

## 2019-09-29 NOTE — Progress Notes (Signed)
Attempted echo. Was asked to come back later.

## 2019-09-29 NOTE — Progress Notes (Signed)
  Echocardiogram 2D Echocardiogram has been performed.  Alexander [name] 09/29/2019, 1:01 PM
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
## 2019-09-29 NOTE — Progress Notes (Signed)
MEWS score now green.  

## 2019-09-29 NOTE — Discharge Instructions (Signed)
Information on my medicine - ELIQUIS (apixaban)  This medication education was reviewed with me or my healthcare representative as part of my discharge preparation. Why was Eliquis prescribed for you? Eliquis was prescribed to treat blood clots that may have been found in the veins of your legs (deep vein thrombosis) or in your lungs (pulmonary embolism) and to reduce the risk of them occurring again.  What do You need to know about Eliquis ? The starting dose is 10 mg (two 5 mg tablets) taken TWICE daily for the FIRST SEVEN (7) DAYS, then on Tuesday 10/07/2019  the dose is reduced to ONE 5 mg tablet taken TWICE daily.  Eliquis may be taken with or without food.   Try to take the dose about the same time in the morning and in the evening. If you have difficulty swallowing the tablet whole please discuss with your pharmacist how to take the medication safely.  Take Eliquis exactly as prescribed and DO NOT stop taking Eliquis without talking to the doctor who prescribed the medication.  Stopping may increase your risk of developing a new blood clot.  Refill your prescription before you run out.  After discharge, you should have regular check-up appointments with your healthcare provider that is prescribing your Eliquis.    What do you do if you miss a dose? If a dose of ELIQUIS is not taken at the scheduled time, take it as soon as possible on the same day and twice-daily administration should be resumed. The dose should not be doubled to make up for a missed dose.  Important Safety Information A possible side effect of Eliquis is bleeding. You should call your healthcare provider right away if you experience any of the following: ? Bleeding from an injury or your nose that does not stop. ? Unusual colored urine (red or dark brown) or unusual colored stools (red or black). ? Unusual bruising for unknown reasons. ? A serious fall or if you hit your head (even if there is no  bleeding).  Some medicines may interact with Eliquis and might increase your risk of bleeding or clotting while on Eliquis. To help avoid this, consult your healthcare provider or pharmacist prior to using any new prescription or non-prescription medications, including herbals, vitamins, non-steroidal anti-inflammatory drugs (NSAIDs) and supplements.  This website has more information on Eliquis (apixaban): http://[email or web address].com/eliquis/home
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
## 2019-09-29 NOTE — Progress Notes (Signed)
Pt has been assessed and no skin issues noted. Pt has pain 8/10. 0.5 ml Fentanyl given. MEWS score Red. Charge nurse and [facility] floor coverage made aware. Red MEWS score guidelines implemented. Will continue to monitor patient.

## 2019-09-29 NOTE — Progress Notes (Signed)
Eliquis info with coupon given to patient. Eulas Post, RN

## 2019-09-29 NOTE — Progress Notes (Signed)
ANTICOAGULATION CONSULT NOTE - Follow Up Consult  Pharmacy Consult for Heparin Indication: pulmonary embolus & RLE DVT  No Known Allergies  Patient Measurements: Height: 5\' 9"  (175.3 cm) Weight: 186 lb 11.7 oz (84.7 kg) IBW/kg (Calculated) : 70.7 Heparin Dosing Weight:   Vital Signs: Temp: 98.6 F (37 C) (12/29 0415) Temp Source: Oral (12/29 0415) BP: 125/90 (12/29 0415) Pulse Rate: 94 (12/29 0415)  Labs: Recent Labs    09/28/19 1109 09/28/19 1126 09/28/19 1423 09/28/19 2341 09/29/19 0326  HGB 15.2  --   --   --  14.0  HCT 46.8  --   --   --  42.6  PLT 214  --   --   --  198  HEPARINUNFRC  --   --   --  0.39  --   CREATININE 0.78  --   --   --   --   TROPONINIHS  --  3 2  --   --     Estimated Creatinine Clearance: 101.9 mL/min (by C-G formula based on SCr of 0.78 mg/dL).   Medications:  Infusions:  . heparin 1,500 Units/hr (09/28/19 1556)    Assessment: Patient with heparin level at goal.  No heparin issues noted.  Goal of Therapy:  Heparin level 0.3-0.7 units/ml Monitor platelets by anticoagulation protocol: Yes   Plan:  Continue heparin drip at current rate Recheck level at 0800  Tyler Deis, [place] 09/29/2019,5:00 AM

## 2019-09-30 LAB — CBC
HCT: 41.8 % (ref 39.0–52.0)
Hemoglobin: 13.6 g/dL (ref 13.0–17.0)
MCH: 33.9 pg (ref 26.0–34.0)
MCHC: 32.5 g/dL (ref 30.0–36.0)
MCV: 104.2 fL — ABNORMAL HIGH (ref 80.0–100.0)
Platelets: 200 10*3/uL (ref 150–400)
RBC: 4.01 MIL/uL — ABNORMAL LOW (ref 4.22–5.81)
RDW: 12.6 % (ref 11.5–15.5)
WBC: 9.9 10*3/uL (ref 4.0–10.5)
nRBC: 0 % (ref 0.0–0.2)

## 2019-09-30 LAB — BASIC METABOLIC PANEL
Anion gap: 11 (ref 5–15)
BUN: 13 mg/dL (ref 6–20)
CO2: 22 mmol/L (ref 22–32)
Calcium: 9.1 mg/dL (ref 8.9–10.3)
Chloride: 101 mmol/L (ref 98–111)
Creatinine, Ser: 0.84 mg/dL (ref 0.61–1.24)
GFR calc Af Amer: >60 mL/min (ref >60)
GFR calc non Af Amer: >60 mL/min (ref >60)
Glucose, Bld: 197 mg/dL — ABNORMAL HIGH (ref 70–99)
Potassium: 3.4 mmol/L — ABNORMAL LOW (ref 3.5–5.1)
Sodium: 134 mmol/L — ABNORMAL LOW (ref 135–145)

## 2019-09-30 MED ORDER — ZOLPIDEM TARTRATE 5 MG PO TABS: 5.0000 mg | ORAL_TABLET | Freq: Every evening | ORAL | 0 refills | Status: DC | PRN

## 2019-09-30 MED ORDER — PANTOPRAZOLE SODIUM 20 MG PO TBEC: 20.0000 mg | DELAYED_RELEASE_TABLET | Freq: Every day | ORAL | 0 refills | Status: DC

## 2019-09-30 MED ORDER — APIXABAN 5 MG PO TABS: 10.0000 mg | ORAL_TABLET | Freq: Two times a day (BID) | ORAL | 0 refills | Status: DC

## 2019-09-30 MED ORDER — POTASSIUM CHLORIDE CRYS ER 20 MEQ PO TBCR
40.0000 meq | EXTENDED_RELEASE_TABLET | Freq: Once | ORAL | Status: AC
  Administered 2019-09-30: 40 meq via ORAL
  Filled 2019-09-30: qty 2

## 2019-09-30 MED ORDER — APIXABAN 5 MG PO TABS: 5.0000 mg | ORAL_TABLET | Freq: Two times a day (BID) | ORAL | 0 refills | Status: DC

## 2019-09-30 MED ORDER — POLYETHYLENE GLYCOL 3350 17 G PO PACK: 17.0000 g | PACK | Freq: Every day | ORAL | 0 refills | Status: DC

## 2019-09-30 MED ORDER — OXYCODONE HCL 10 MG PO TABS: 10.0000 mg | ORAL_TABLET | Freq: Four times a day (QID) | ORAL | 0 refills | Status: DC | PRN

## 2019-09-30 MED ORDER — POLYETHYLENE GLYCOL 3350 17 G PO PACK
17.0000 g | PACK | Freq: Every day | ORAL | Status: DC
  Administered 2019-09-30: 17 g via ORAL
  Filled 2019-09-30: qty 1

## 2019-09-30 MED ORDER — METOPROLOL TARTRATE 25 MG PO TABS: 25.0000 mg | ORAL_TABLET | Freq: Two times a day (BID) | ORAL | 0 refills | Status: DC

## 2019-09-30 NOTE — Progress Notes (Signed)
Patient ambulated approx 30 ft and oxygen saturation stayed 93-94% on room air. No obvious shortness of breath. Did have continuing pain in left lower chest. Overall, tolerated well. Eulas Post, RN

## 2019-09-30 NOTE — Progress Notes (Signed)
Report received from Louann Sjogren, RN. No change from initial pm assessment. Will continue to monitor and follow the POC.
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
## 2019-10-01 NOTE — Progress Notes (Signed)
On call MD paged regarding pt wanting to be discharged. MD stated if you can view the AVS document and after visit summary and the order to discharge is present, pt can leave. Agricultural consultant notified. Pt provided with discharge packet and educated on new medications and follow up appointments. All questions answered. Pt taken down to lobby with RN and NT and stated he would walk to his car in the parking lot.

## 2019-10-01 NOTE — Progress Notes (Signed)
Pt states he wants to leave now. RN explained that the doctors round in the morning and will put in his discharge orders. Pt states he wants to be gone by 0730 to see his kids. RN explained how AMA works and pt wants his telemetry removed. RN explained that the order for tele monitoring is still active and the doctors need to discontinue the order when he is discharged home. Pt states he will try to wait for the doctors to round but still needs to leave by 0730. Pt refusing morning vitals. Charge nurse notified.

## 2019-10-02 DIAGNOSIS — I10 Essential (primary) hypertension: Secondary | ICD-10-CM

## 2019-10-02 HISTORY — DX: Essential (primary) hypertension: I10

## 2019-10-05 ENCOUNTER — Telehealth: Payer: Self-pay

## 2019-10-05 NOTE — Telephone Encounter (Signed)
I called pt. To get him scheduled for a hospital f/u per pt. He wanted to be scheduled on 10/14/19 at 11:45 medications were gone over and reconciled, pt. Let me know again that he needs the short term disability form filled out asap. I put form in your folder to go back.

## 2019-10-05 NOTE — Telephone Encounter (Signed)
Pt. Called wanting to know if you had received any paperwork about short term disability from his work, let me know if you got it or not and I can call the pt. And let him know. Thank you.

## 2019-10-06 NOTE — Telephone Encounter (Signed)
Please document what he called and said to you today if it was inappropriate.   CC Lafonda Mosses.  I JUST received the paperwork on my desk today.   He will probably need to be seen before the paperwork can be completed.   I believe he was in the hospital.  So I have no way to know if and how long he needs to be out of work without a follow up appt.

## 2019-10-06 NOTE — Telephone Encounter (Signed)
Pt called back today and wanted to see if his short term paperwork had been completed. Pt got angry and stated that he needed the paperwork completed and faxed as soon as possible so he could get his money.
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
## 2019-10-08 ENCOUNTER — Ambulatory Visit: Payer: BC Managed Care – PPO | Admitting: Medical

## 2019-10-08 ENCOUNTER — Telehealth: Payer: Self-pay | Admitting: Medical

## 2019-10-08 ENCOUNTER — Other Ambulatory Visit: Payer: Self-pay

## 2019-10-08 ENCOUNTER — Encounter: Payer: Self-pay | Admitting: Medical

## 2019-10-08 VITALS — BP 162/84 | HR 110 | Temp 98.6°F | Ht 69.0 in | Wt 186.8 lb

## 2019-10-08 DIAGNOSIS — G479 Sleep disorder, unspecified: Secondary | ICD-10-CM | POA: Insufficient documentation

## 2019-10-08 DIAGNOSIS — E790 Hyperuricemia without signs of inflammatory arthritis and tophaceous disease: Secondary | ICD-10-CM

## 2019-10-08 DIAGNOSIS — I82451 Acute embolism and thrombosis of right peroneal vein: Secondary | ICD-10-CM | POA: Diagnosis not present

## 2019-10-08 DIAGNOSIS — R0781 Pleurodynia: Secondary | ICD-10-CM

## 2019-10-08 DIAGNOSIS — M25571 Pain in right ankle and joints of right foot: Secondary | ICD-10-CM

## 2019-10-08 DIAGNOSIS — I159 Secondary hypertension, unspecified: Secondary | ICD-10-CM

## 2019-10-08 DIAGNOSIS — R52 Pain, unspecified: Secondary | ICD-10-CM | POA: Insufficient documentation

## 2019-10-08 DIAGNOSIS — I739 Peripheral vascular disease, unspecified: Secondary | ICD-10-CM | POA: Insufficient documentation

## 2019-10-08 DIAGNOSIS — K5790 Diverticulosis of intestine, part unspecified, without perforation or abscess without bleeding: Secondary | ICD-10-CM | POA: Insufficient documentation

## 2019-10-08 DIAGNOSIS — Z86711 Personal history of pulmonary embolism: Secondary | ICD-10-CM

## 2019-10-08 DIAGNOSIS — K760 Fatty (change of) liver, not elsewhere classified: Secondary | ICD-10-CM

## 2019-10-08 DIAGNOSIS — G8929 Other chronic pain: Secondary | ICD-10-CM

## 2019-10-08 DIAGNOSIS — M21961 Unspecified acquired deformity of right lower leg: Secondary | ICD-10-CM

## 2019-10-08 DIAGNOSIS — M4800 Spinal stenosis, site unspecified: Secondary | ICD-10-CM | POA: Insufficient documentation

## 2019-10-08 DIAGNOSIS — I2699 Other pulmonary embolism without acute cor pulmonale: Secondary | ICD-10-CM

## 2019-10-08 DIAGNOSIS — I700 Atherosclerosis of aorta: Secondary | ICD-10-CM | POA: Insufficient documentation

## 2019-10-08 DIAGNOSIS — M79671 Pain in right foot: Secondary | ICD-10-CM

## 2019-10-08 DIAGNOSIS — K219 Gastro-esophageal reflux disease without esophagitis: Secondary | ICD-10-CM

## 2019-10-08 DIAGNOSIS — Z86718 Personal history of other venous thrombosis and embolism: Secondary | ICD-10-CM

## 2019-10-08 DIAGNOSIS — M1A9XX0 Chronic gout, unspecified, without tophus (tophi): Secondary | ICD-10-CM

## 2019-10-08 MED ORDER — ALLOPURINOL 100 MG PO TABS: 100.0000 mg | ORAL_TABLET | Freq: Every day | ORAL | 3 refills | Status: DC

## 2019-10-08 MED ORDER — ROSUVASTATIN CALCIUM 20 MG PO TABS: 20.0000 mg | ORAL_TABLET | Freq: Every day | ORAL | 3 refills | Status: DC

## 2019-10-08 MED ORDER — APIXABAN 5 MG PO TABS: 5.0000 mg | ORAL_TABLET | Freq: Two times a day (BID) | ORAL | 1 refills | Status: DC
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
## 2019-10-08 NOTE — Patient Instructions (Addendum)
You were recently hospitalized for blood clots in the lung and legs.  Other findings include CT scan evidence of cholesterol buildup in your aorta big blood vessel in the abdomen as well as cholesterol buildup in your vessels in your legs.  This is called peripheral vascular disease and aortic atherosclerosis.   Recommendations:  We are going to refer you back to cardiology for follow-up and discussion of the recent echocardiogram, discussion of your new blood pressure issue, discussion of the aortic atherosclerosis and peripheral vascular disease  Begin Crestor cholesterol pill daily at bedtime to lower the cholesterol and to help lower your risk of heart attack and stroke  Eat a healthy low-fat low-cholesterol diet  Continue your current medications including the new anticoagulant Eliquis twice daily  Avoid anti-inflammatories such as ibuprofen, Aleve, Advil, Motrin,  as these will interact with the Eliquis  Wear your compression hose daily  Gradually return your activity over the next several weeks  Walk and stay active but avoid strenuous activity, avoid trauma to your body, avoid heavy lifting  Continue your other medications as usual  Try to lose weight through healthy low-fat diet and exercise with walking  The assumption from now is that you will need to take anticoagulation such as Eliquis lifelong since this is your second blood clot     Fat and Cholesterol Restricted Eating Plan Getting too much fat and cholesterol in your diet may cause health problems. Choosing the right foods helps keep your fat and cholesterol at normal levels. This can keep you from getting certain diseases. What are tips for following this plan? Meal planning  At meals, divide your plate into four equal parts: ? Fill one-half of your plate with vegetables and green salads. ? Fill one-fourth of your plate with whole grains. ? Fill one-fourth of your plate with low-fat (lean) protein  foods.  Eat fish that is high in omega-3 fats at least two times a week. This includes mackerel, tuna, sardines, and salmon.  Eat foods that are high in fiber, such as whole grains, beans, apples, broccoli, carrots, peas, and barley. General tips   Work with your doctor to lose weight if you need to.  Avoid: ? Foods with added sugar. ? Fried foods. ? Foods with partially hydrogenated oils.  Limit alcohol intake to no more than 1 drink a day for nonpregnant women and 2 drinks a day for men. One drink equals 12 oz of beer, 5 oz of wine, or 1 oz of hard liquor. Reading food labels  Check food labels for: ? Trans fats. ? Partially hydrogenated oils. ? Saturated fat (g) in each serving. ? Cholesterol (mg) in each serving. ? Fiber (g) in each serving.  Choose foods with healthy fats, such as: ? Monounsaturated fats. ? Polyunsaturated fats. ? Omega-3 fats.  Choose grain products that have whole grains. Look for the word "whole" as the first word in the ingredient list. Cooking  Cook foods using low-fat methods. These include baking, boiling, grilling, and broiling.  Eat more home-cooked foods. Eat at restaurants and buffets less often.  Avoid cooking using saturated fats, such as butter, cream, palm oil, palm kernel oil, and coconut oil. Recommended foods  Fruits  All fresh, canned (in natural juice), or frozen fruits. Vegetables  Fresh or frozen vegetables (raw, steamed, roasted, or grilled). Green salads. Grains  Whole grains, such as whole wheat or whole grain breads, crackers, cereals, and pasta. Unsweetened oatmeal, bulgur, barley, quinoa, or brown rice. Corn or  whole wheat flour tortillas. Meats and other protein foods  Ground beef (85% or leaner), grass-fed beef, or beef trimmed of fat. Skinless chicken or [place]. Ground chicken or [place]. Pork trimmed of fat. All fish and seafood. Egg whites. Dried beans, peas, or lentils. Unsalted nuts or seeds. Unsalted canned  beans. Nut butters without added sugar or oil. Dairy  Low-fat or nonfat dairy products, such as skim or 1% milk, 2% or reduced-fat cheeses, low-fat and fat-free ricotta or cottage cheese, or plain low-fat and nonfat yogurt. Fats and oils  Tub margarine without trans fats. Light or reduced-fat mayonnaise and salad dressings. Avocado. Olive, canola, sesame, or safflower oils. The items listed above may not be a complete list of foods and beverages you can eat. Contact a dietitian for more information. Foods to avoid Fruits  Canned fruit in heavy syrup. Fruit in cream or butter sauce. Fried fruit. Vegetables  Vegetables cooked in cheese, cream, or butter sauce. Fried vegetables. Grains  White bread. White pasta. White rice. Cornbread. Bagels, pastries, and croissants. Crackers and snack foods that contain trans fat and hydrogenated oils. Meats and other protein foods  Fatty cuts of meat. Ribs, chicken wings, bacon, sausage, bologna, salami, chitterlings, fatback, hot dogs, bratwurst, and packaged lunch meats. Liver and organ meats. Whole eggs and egg yolks. Chicken and [place] with skin. Fried meat. Dairy  Whole or 2% milk, cream, half-and-half, and cream cheese. Whole milk cheeses. Whole-fat or sweetened yogurt. Full-fat cheeses. Nondairy creamers and whipped toppings. Processed cheese, cheese spreads, and cheese curds. Beverages  Alcohol. Sugar-sweetened drinks such as sodas, lemonade, and fruit drinks. Fats and oils  Butter, stick margarine, lard, shortening, ghee, or bacon fat. Coconut, palm kernel, and palm oils. Sweets and desserts  Corn syrup, sugars, honey, and molasses. Candy. Jam and jelly. Syrup. Sweetened cereals. Cookies, pies, cakes, donuts, muffins, and ice cream. The items listed above may not be a complete list of foods and beverages you should avoid. Contact a dietitian for more information. Summary  Choosing the right foods helps keep your fat and cholesterol at  normal levels. This can keep you from getting certain diseases.  At meals, fill one-half of your plate with vegetables and green salads.  Eat high-fiber foods, like whole grains, beans, apples, carrots, peas, and barley.  Limit added sugar, saturated fats, alcohol, and fried foods. This information is not intended to replace advice given to you by your health care provider. Make sure you discuss any questions you have with your health care provider. Document Revised: 05/21/2018 Document Reviewed: 06/04/2017 Elsevier Patient Education  2020 [employer]     Peripheral Vascular Disease  Peripheral vascular disease (PVD) is a disease of the blood vessels that are not part of your heart and brain. A simple term for PVD is poor circulation. In most cases, PVD narrows the blood vessels that carry blood from your heart to the rest of your body. This can reduce the supply of blood to your arms, legs, and internal organs, like your stomach or kidneys. However, PVD most often affects a person's lower legs and feet. Without treatment, PVD tends to get worse. PVD can also lead to acute ischemic limb. This is when an arm or leg suddenly cannot get enough blood. This is a medical emergency. Follow these instructions at home: Lifestyle  Do not use any products that contain nicotine or tobacco, such as cigarettes and e-cigarettes. If you need help quitting, ask your doctor.  Lose weight if you are overweight.  Or, stay at a healthy weight as told by your doctor.  Eat a diet that is low in fat and cholesterol. If you need help, ask your doctor.  Exercise regularly. Ask your doctor for activities that are right for you. General instructions  Take over-the-counter and prescription medicines only as told by your doctor.  Take good care of your feet: ? Wear comfortable shoes that fit well. ? Check your feet often for any cuts or sores.  Keep all follow-up visits as told by your doctor This is  important. Contact a doctor if:  You have cramps in your legs when you walk.  You have leg pain when you are at rest.  You have coldness in a leg or foot.  Your skin changes.  You are unable to get or have an erection (erectile dysfunction).  You have cuts or sores on your feet that do not heal. Get help right away if:  Your arm or leg turns cold, numb, and blue.  Your arms or legs become red, warm, swollen, painful, or numb.  You have chest pain.  You have trouble breathing.  You suddenly have weakness in your face, arm, or leg.  You become very confused or you cannot speak.  You suddenly have a very bad headache.  You suddenly cannot see. Summary  Peripheral vascular disease (PVD) is a disease of the blood vessels.  A simple term for PVD is poor circulation. Without treatment, PVD tends to get worse.  Treatment may include exercise, low fat and low cholesterol diet, and quitting smoking. This information is not intended to replace advice given to you by your health care provider. Make sure you discuss any questions you have with your health care provider. Document Revised: 08/30/2017 Document Reviewed: 10/25/2016 Elsevier Patient Education  2020 [employer].  Fatty Liver Disease  Fatty liver disease occurs when too much fat has built up in your liver cells. Fatty liver disease is also called hepatic steatosis or steatohepatitis. The liver removes harmful substances from your bloodstream and produces fluids that your body needs. It also helps your body use and store energy from the food you eat. In many cases, fatty liver disease does not cause symptoms or problems. It is often diagnosed when tests are being done for other reasons. However, over time, fatty liver can cause inflammation that may lead to more serious liver problems, such as scarring of the liver (cirrhosis) and liver failure. Fatty liver is associated with insulin resistance, increased body fat, high  blood pressure (hypertension), and high cholesterol. These are features of metabolic syndrome and increase your risk for stroke, diabetes, and heart disease. What are the causes? This condition may be caused by:  Drinking too much alcohol.  Poor nutrition.  Obesity.  Cushing's syndrome.  Diabetes.  High cholesterol.  Certain drugs.  Poisons.  Some viral infections.  Pregnancy. What increases the risk? You are more likely to develop this condition if you:  Abuse alcohol.  Are overweight.  Have diabetes.  Have hepatitis.  Have a high triglyceride level.  Are pregnant. What are the signs or symptoms? Fatty liver disease often does not cause symptoms. If symptoms do develop, they can include:  Fatigue.  Weakness.  Weight loss.  Confusion.  Abdominal pain.  Nausea and vomiting.  Yellowing of your skin and the white parts of your eyes (jaundice).  Itchy skin. How is this diagnosed? This condition may be diagnosed by:  A physical exam and medical history.  Blood  tests.  Imaging tests, such as an ultrasound, CT scan, or MRI.  A liver biopsy. A small sample of liver tissue is removed using a needle. The sample is then looked at under a microscope. How is this treated? Fatty liver disease is often caused by other health conditions. Treatment for fatty liver may involve medicines and lifestyle changes to manage conditions such as:  Alcoholism.  High cholesterol.  Diabetes.  Being overweight or obese. Follow these instructions at home:   Do not drink alcohol. If you have trouble quitting, ask your health care provider how to safely quit with the help of medicine or a supervised program. This is important to keep your condition from getting worse.  Eat a healthy diet as told by your health care provider. Ask your health care provider about working with a diet and nutrition specialist (dietitian) to develop an eating plan.  Exercise regularly. This  can help you lose weight and control your cholesterol and diabetes. Talk to your health care provider about an exercise plan and which activities are best for you.  Take over-the-counter and prescription medicines only as told by your health care provider.  Keep all follow-up visits as told by your health care provider. This is important. Contact a health care provider if: You have trouble controlling your:  Blood sugar. This is especially important if you have diabetes.  Cholesterol.  Drinking of alcohol. Get help right away if:  You have abdominal pain.  You have jaundice.  You have nausea and vomiting.  You vomit blood or material that looks like coffee grounds.  You have stools that are black, tar-like, or bloody. Summary  Fatty liver disease develops when too much fat builds up in the cells of your liver.  Fatty liver disease often causes no symptoms or problems. However, over time, fatty liver can cause inflammation that may lead to more serious liver problems, such as scarring of the liver (cirrhosis).  You are more likely to develop this condition if you abuse alcohol, are pregnant, are overweight, have diabetes, have hepatitis, or have high triglyceride levels.  Contact your health care provider if you have trouble controlling your weight, blood sugar, cholesterol, or drinking of alcohol. This information is not intended to replace advice given to you by your health care provider. Make sure you discuss any questions you have with your health care provider. Document Revised: 08/30/2017 Document Reviewed: 06/26/2017 Elsevier Patient Education  2020 [employer].

## 2019-10-08 NOTE — Telephone Encounter (Signed)
Work to make f/u appt with cardiology that he has seen before.  This is for hospital f/u, echocardiogram review, CT scan showing aortic atherosclerosis, PAD

## 2019-10-09 NOTE — Telephone Encounter (Signed)
Patient has cardiology appointment scheduled for 10/19/19 @2 :40

## 2019-10-09 NOTE — Telephone Encounter (Signed)
Yeah!!!!

## 2019-10-09 NOTE — Progress Notes (Signed)
done

## 2019-10-14 ENCOUNTER — Inpatient Hospital Stay: Payer: BC Managed Care – PPO | Admitting: Medical

## 2019-10-15 ENCOUNTER — Other Ambulatory Visit: Payer: BC Managed Care – PPO
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
## 2019-10-19 ENCOUNTER — Encounter: Payer: Self-pay | Admitting: Internal Medicine

## 2019-10-19 ENCOUNTER — Ambulatory Visit (INDEPENDENT_AMBULATORY_CARE_PROVIDER_SITE_OTHER): Payer: BC Managed Care – PPO | Admitting: Internal Medicine

## 2019-10-19 ENCOUNTER — Other Ambulatory Visit: Payer: Self-pay

## 2019-10-19 VITALS — BP 146/90 | HR 99 | Ht 69.0 in | Wt 188.4 lb

## 2019-10-19 DIAGNOSIS — R931 Abnormal findings on diagnostic imaging of heart and coronary circulation: Secondary | ICD-10-CM

## 2019-10-19 NOTE — Patient Instructions (Signed)
Medication Instructions:  NO CHANGES *If you need a refill on your cardiac medications before your next appointment, please call your pharmacy*  Lab Work: NONE TODAY If you have labs (blood work) drawn today and your tests are completely normal, you will receive your results only by: Marland Kitchen MyChart Message (if you have MyChart) OR . A paper copy in the mail If you have any lab test that is abnormal or we need to change your treatment, we will call you to review the results.  Testing/Procedures: Your physician has requested that you have a LIMITED echocardiogram. Echocardiography is a painless test that uses sound waves to create images of your heart. It provides your doctor with information about the size and shape of your heart and how well your heart's chambers and valves are working. This procedure takes approximately one hour. There are no restrictions for this procedure. LIMITED   Follow-Up: At [facility], you and your health needs are our priority.  As part of our continuing mission to provide you with exceptional heart care, we have created designated Provider Care Teams.  These Care Teams include your primary Cardiologist (physician) and Advanced Practice Providers (APPs -  Physician Assistants and Nurse Practitioners) who all work together to provide you with the care you need, when you need it.  Your next appointment:   5 month(s)  The format for your next appointment:   Either In Person or Virtual  Provider:   You may see Dietrich Pates, MD or one of the following Advanced Practice Providers on your designated Care Team:    Tereso Newcomer, PA-C  Vin [place], [place]  Berton Bon, NP   Other Instructions

## 2019-10-22 ENCOUNTER — Other Ambulatory Visit: Payer: Self-pay

## 2019-10-26 ENCOUNTER — Other Ambulatory Visit: Payer: Self-pay

## 2019-10-26 ENCOUNTER — Telehealth: Payer: Self-pay | Admitting: Medical

## 2019-10-26 NOTE — Telephone Encounter (Signed)
Patient needs a refill on this medication.

## 2019-10-26 NOTE — Telephone Encounter (Signed)
I received a request for oxycodone refill, but the last oxycodone was refilled by the hospital.  This is a narcotic and is meant to be short-term.  What situation is he requesting pain medicine for?  If this is regarding leg pain from DVT, I can send a lower dose hydrocodone in the short-term, but we normally do not continue refilling narcotics ongoing

## 2019-10-27 ENCOUNTER — Other Ambulatory Visit: Payer: Self-pay | Admitting: Medical

## 2019-10-27 MED ORDER — HYDROCODONE-ACETAMINOPHEN 5-325 MG PO TABS: 1.0000 | ORAL_TABLET | Freq: Four times a day (QID) | ORAL | 0 refills | Status: DC | PRN

## 2019-10-27 NOTE — Telephone Encounter (Signed)
Patient stated it is for his leg pain and he will take the hydrocodone. Stated he needs something.

## 2019-10-30 ENCOUNTER — Ambulatory Visit (INDEPENDENT_AMBULATORY_CARE_PROVIDER_SITE_OTHER): Payer: BC Managed Care – PPO | Admitting: Medical

## 2019-10-30 ENCOUNTER — Encounter: Payer: Self-pay | Admitting: Medical

## 2019-10-30 ENCOUNTER — Other Ambulatory Visit: Payer: Self-pay

## 2019-10-30 VITALS — BP 150/80 | HR 98 | Temp 98.0°F | Ht 69.0 in | Wt 187.0 lb

## 2019-10-30 DIAGNOSIS — I82451 Acute embolism and thrombosis of right peroneal vein: Secondary | ICD-10-CM

## 2019-10-30 DIAGNOSIS — I10 Essential (primary) hypertension: Secondary | ICD-10-CM

## 2019-10-30 DIAGNOSIS — M1A9XX0 Chronic gout, unspecified, without tophus (tophi): Secondary | ICD-10-CM

## 2019-10-30 DIAGNOSIS — M25571 Pain in right ankle and joints of right foot: Secondary | ICD-10-CM

## 2019-10-30 DIAGNOSIS — E790 Hyperuricemia without signs of inflammatory arthritis and tophaceous disease: Secondary | ICD-10-CM

## 2019-10-30 DIAGNOSIS — I2699 Other pulmonary embolism without acute cor pulmonale: Secondary | ICD-10-CM | POA: Diagnosis not present

## 2019-10-30 DIAGNOSIS — G8929 Other chronic pain: Secondary | ICD-10-CM

## 2019-10-30 DIAGNOSIS — Z9889 Other specified postprocedural states: Secondary | ICD-10-CM

## 2019-10-30 DIAGNOSIS — I700 Atherosclerosis of aorta: Secondary | ICD-10-CM | POA: Diagnosis not present

## 2019-10-30 DIAGNOSIS — Z79899 Other long term (current) drug therapy: Secondary | ICD-10-CM

## 2019-10-30 DIAGNOSIS — E785 Hyperlipidemia, unspecified: Secondary | ICD-10-CM | POA: Insufficient documentation

## 2019-10-30 DIAGNOSIS — I739 Peripheral vascular disease, unspecified: Secondary | ICD-10-CM

## 2019-10-30 HISTORY — DX: Other specified postprocedural states: Z98.890

## 2019-10-30 MED ORDER — HYDROCODONE-ACETAMINOPHEN 5-325 MG PO TABS: 1.0000 | ORAL_TABLET | Freq: Four times a day (QID) | ORAL | 0 refills | Status: DC | PRN

## 2019-10-30 MED ORDER — METOPROLOL TARTRATE 50 MG PO TABS: 50.0000 mg | ORAL_TABLET | Freq: Two times a day (BID) | ORAL | 1 refills | Status: DC

## 2019-10-30 NOTE — Addendum Note (Signed)
Addended by: Jac Canavan on: 10/30/2019 10:54 AM   Modules accepted: Level of Service
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
## 2019-11-04 ENCOUNTER — Telehealth: Payer: Self-pay | Admitting: Podiatry

## 2019-11-04 NOTE — Telephone Encounter (Signed)
Pt is requesting pain meds 

## 2019-11-11 ENCOUNTER — Telehealth: Payer: Self-pay | Admitting: Podiatry

## 2019-11-11 NOTE — Telephone Encounter (Signed)
Pt called to see if Dr.Evans would call in something for his foot pain

## 2019-11-11 NOTE — Telephone Encounter (Signed)
Patient would like to get something for pain. Alexander [name]

## 2019-11-16 ENCOUNTER — Telehealth: Payer: Self-pay

## 2019-11-16 NOTE — Telephone Encounter (Signed)
Pt called requesting a RF on his pain medication

## 2019-11-20 ENCOUNTER — Other Ambulatory Visit (HOSPITAL_COMMUNITY): Payer: BC Managed Care – PPO

## 2019-11-24 ENCOUNTER — Telehealth: Payer: Self-pay | Admitting: *Deleted

## 2019-11-24 DIAGNOSIS — Z9889 Other specified postprocedural states: Secondary | ICD-10-CM

## 2019-11-24 DIAGNOSIS — M19071 Primary osteoarthritis, right ankle and foot: Secondary | ICD-10-CM

## 2019-11-24 DIAGNOSIS — M659 Synovitis and tenosynovitis, unspecified: Secondary | ICD-10-CM

## 2019-11-24 NOTE — Telephone Encounter (Signed)
-----   Message from Felecia Shelling, DPM sent at 11/12/2019  2:59 PM EST ----- Regarding: Referral to pain management [place] [facility], did we ever put a referral in for this patient to pain management? If not, could we get him a referral sent over. Thanks so much, Dr. Logan Bores

## 2019-11-24 NOTE — Telephone Encounter (Signed)
Faxed required form, clinicals and demographics to [facility]. 

## 2019-12-04 ENCOUNTER — Other Ambulatory Visit (HOSPITAL_COMMUNITY): Payer: BC Managed Care – PPO

## 2019-12-04 NOTE — Telephone Encounter (Signed)
Pt called for pain medication, I reviewed clinicals and he had not been evaluated by Dr. Logan Bores since 08/2019, and had received Norco from another doctor 10/30/2019. I told pt that he would need to be seen by Dr. Logan Bores and he may not refill the narcotic pain medication, due to narcotics were not to be prescribed by more than one provider. Transferred to schedulers.

## 2019-12-04 NOTE — Telephone Encounter (Signed)
Pt calling to follow up on previous refill request for pain medication.

## 2019-12-25 ENCOUNTER — Telehealth (HOSPITAL_COMMUNITY): Payer: Self-pay | Admitting: Internal Medicine

## 2019-12-25 ENCOUNTER — Other Ambulatory Visit (HOSPITAL_COMMUNITY): Payer: BC Managed Care – PPO

## 2019-12-25 NOTE — Telephone Encounter (Signed)
Do not reschedule.

## 2019-12-25 NOTE — Telephone Encounter (Signed)
Patient No Showed Echocardiogram on 12/25/2019 and cancelled 03/5 and 11/20/2019.  Would you like me to attempt to reschedule this test for patient?

## 2020-01-15 ENCOUNTER — Other Ambulatory Visit: Payer: Self-pay

## 2020-01-15 ENCOUNTER — Ambulatory Visit (HOSPITAL_COMMUNITY): Payer: BC Managed Care – PPO | Attending: Cardiology

## 2020-01-15 ENCOUNTER — Telehealth: Payer: Self-pay | Admitting: Podiatry

## 2020-01-15 ENCOUNTER — Encounter (HOSPITAL_COMMUNITY): Payer: Self-pay | Admitting: *Deleted

## 2020-01-15 DIAGNOSIS — R931 Abnormal findings on diagnostic imaging of heart and coronary circulation: Secondary | ICD-10-CM

## 2020-01-15 MED ORDER — PERFLUTREN LIPID MICROSPHERE
1.0000 mL | INTRAVENOUS | Status: AC | PRN
  Administered 2020-01-15: 3 mL via INTRAVENOUS

## 2020-01-15 NOTE — Telephone Encounter (Signed)
Patient dropped off FMLA paperwork to be completed. He was last seen 11/202, I informed patient I needed up to date notes. Patient not happy, He was scheduled for 5/17, I changed to 5/3, and again advised patient that I would complete forms, after his appt.

## 2020-01-18 NOTE — Telephone Encounter (Signed)
Yes, that is appropriate. Thanks Marylene Land, Dr. Logan Bores

## 2020-01-20 ENCOUNTER — Telehealth: Payer: Self-pay | Admitting: Internal Medicine

## 2020-01-20 NOTE — Telephone Encounter (Signed)
See echo results, pt has been notified .

## 2020-01-20 NOTE — Telephone Encounter (Signed)
Patient calling back for echo results  

## 2020-01-20 NOTE — Telephone Encounter (Signed)
Patient called back for his echo results. He would just like the office to leave a detailed message on his machine with the results

## 2020-01-22 ENCOUNTER — Ambulatory Visit (INDEPENDENT_AMBULATORY_CARE_PROVIDER_SITE_OTHER): Payer: BC Managed Care – PPO | Admitting: Medical

## 2020-01-22 ENCOUNTER — Encounter: Payer: Self-pay | Admitting: Medical

## 2020-01-22 ENCOUNTER — Other Ambulatory Visit: Payer: Self-pay

## 2020-01-22 VITALS — BP 142/90 | HR 95 | Temp 97.9°F | Ht 69.0 in | Wt 190.6 lb

## 2020-01-22 DIAGNOSIS — Z86711 Personal history of pulmonary embolism: Secondary | ICD-10-CM

## 2020-01-22 DIAGNOSIS — M25571 Pain in right ankle and joints of right foot: Secondary | ICD-10-CM

## 2020-01-22 DIAGNOSIS — Z125 Encounter for screening for malignant neoplasm of prostate: Secondary | ICD-10-CM

## 2020-01-22 DIAGNOSIS — K760 Fatty (change of) liver, not elsewhere classified: Secondary | ICD-10-CM | POA: Diagnosis not present

## 2020-01-22 DIAGNOSIS — G8929 Other chronic pain: Secondary | ICD-10-CM

## 2020-01-22 DIAGNOSIS — M79671 Pain in right foot: Secondary | ICD-10-CM

## 2020-01-22 DIAGNOSIS — R7301 Impaired fasting glucose: Secondary | ICD-10-CM | POA: Insufficient documentation

## 2020-01-22 DIAGNOSIS — K529 Noninfective gastroenteritis and colitis, unspecified: Secondary | ICD-10-CM | POA: Insufficient documentation

## 2020-01-22 DIAGNOSIS — Z9889 Other specified postprocedural states: Secondary | ICD-10-CM

## 2020-01-22 DIAGNOSIS — Z1211 Encounter for screening for malignant neoplasm of colon: Secondary | ICD-10-CM

## 2020-01-22 DIAGNOSIS — I739 Peripheral vascular disease, unspecified: Secondary | ICD-10-CM

## 2020-01-22 DIAGNOSIS — I10 Essential (primary) hypertension: Secondary | ICD-10-CM

## 2020-01-22 DIAGNOSIS — I700 Atherosclerosis of aorta: Secondary | ICD-10-CM

## 2020-01-22 DIAGNOSIS — Z91199 Patient's noncompliance with other medical treatment and regimen due to unspecified reason: Secondary | ICD-10-CM

## 2020-01-22 DIAGNOSIS — Z79899 Other long term (current) drug therapy: Secondary | ICD-10-CM

## 2020-01-22 DIAGNOSIS — E785 Hyperlipidemia, unspecified: Secondary | ICD-10-CM

## 2020-01-22 DIAGNOSIS — M21961 Unspecified acquired deformity of right lower leg: Secondary | ICD-10-CM

## 2020-01-22 DIAGNOSIS — Z86718 Personal history of other venous thrombosis and embolism: Secondary | ICD-10-CM

## 2020-01-22 DIAGNOSIS — E790 Hyperuricemia without signs of inflammatory arthritis and tophaceous disease: Secondary | ICD-10-CM

## 2020-01-22 DIAGNOSIS — Z9119 Patient's noncompliance with other medical treatment and regimen: Secondary | ICD-10-CM | POA: Insufficient documentation

## 2020-01-22 NOTE — Patient Instructions (Signed)
Encounter Diagnoses  Name Primary?  . Essential hypertension, benign Yes  . Aortic atherosclerosis (HCC)   . Peripheral arterial disease (HCC)   . Hepatic steatosis   . Chronic foot pain, right   . Chronic pain of right ankle   . Elevated uric acid in blood   . Screen for colon cancer   . Ankle and foot deformity, acquired, right   . Screening for prostate cancer   . History of DVT (deep vein thrombosis)   . History of pulmonary embolus (PE)   . High risk medication use   . S/P surgical manipulation of ankle joint   . Hyperlipidemia, unspecified hyperlipidemia type   . Noncompliance   . Impaired fasting blood sugar   . Chronic diarrhea    High blood pressure  You are not currently taking your blood pressure medicine as recommended by the heart doctor.  I doubt the medicine is causing your current stool issues.  However since you are not taking it I recommend you start some type of medicine to help control blood pressure.  If your blood pressure remains high and not under control, this has the potential to damage your heart, your kidneys, your eyes, and lead to stroke and heart failure   High cholesterol, peripheral arterial disease, aortic atherosclerosis  Due to the fact that you have these diagnoses, you are at high risk for heart attack and stroke.  You should be taking a cholesterol pill daily at bedtime.  I recommend you restart your cholesterol medication   Chronic diarrhea, screening for colon cancer Since you have had some issues with chronic diarrhea and since you have never had a colonoscopy, we are were going to refer you back to gastroenterology for evaluation and colonoscopy.  We referred you last July.  It does not appear that you had this appointment   Chronic foot and ankle pain I will complete the FMLA, but I strongly recommend you discuss your chronic foot and ankle pains and limitations with your foot specialist and pain management specialist.  I am not a  specialist for orthopedics.  I cannot make a determination disability suggesting that you need to remain seated at your job.  You can discuss this with the foot doctor.  Moreover, since I am not a specialist, if you feel you need more time I work on the [employer] that I can provide, again discuss this with Dr. Logan Bores  We are rechecking your uric acid today.  The last 2 times this has been checked the number was fine.  So I recommend you continue allopurinol.  If your allopurinol is being taken daily and if your uric acid is less than 7, you should not be having gout flareups.  So I question whether your pain is really gout or more arthritis as a diagnosis History of multiple DVT and pulmonary embolism-continue lifelong Eliquis blood thinner   At risk for diabetes, elevated glucose We are repeat screening for diabetes today as your blood sugars have been elevated prior   Fatty liver disease This diagnosis puts you  at risk for liver cirrhosis and scarring.  The treatment is healthy low-fat diet, regular exercise, and limiting or avoiding alcohol
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
## 2020-01-23 LAB — COMPREHENSIVE METABOLIC PANEL
ALT: 37 IU/L (ref 0–44)
AST: 42 IU/L — ABNORMAL HIGH (ref 0–40)
Albumin/Globulin Ratio: 1.4 (ref 1.2–2.2)
Albumin: 4.2 g/dL (ref 3.8–4.9)
Alkaline Phosphatase: 81 IU/L (ref 39–117)
BUN/Creatinine Ratio: 15 (ref 9–20)
BUN: 13 mg/dL (ref 6–24)
Bilirubin Total: 0.4 mg/dL (ref 0.0–1.2)
CO2: 22 mmol/L (ref 20–29)
Calcium: 9.5 mg/dL (ref 8.7–10.2)
Chloride: 104 mmol/L (ref 96–106)
Creatinine, Ser: 0.87 mg/dL (ref 0.76–1.27)
GFR calc Af Amer: 111 mL/min/{1.73_m2} (ref >59)
GFR calc non Af Amer: 96 mL/min/{1.73_m2} (ref >59)
Globulin, Total: 3.1 g/dL (ref 1.5–4.5)
Glucose: 78 mg/dL (ref 65–99)
Potassium: 3.9 mmol/L (ref 3.5–5.2)
Sodium: 141 mmol/L (ref 134–144)
Total Protein: 7.3 g/dL (ref 6.0–8.5)

## 2020-01-23 LAB — URIC ACID: Uric Acid: 5 mg/dL (ref 3.8–8.4)

## 2020-01-23 LAB — HEMOGLOBIN A1C
Est. average glucose Bld gHb Est-mCnc: 103 mg/dL
Hgb A1c MFr Bld: 5.2 % (ref 4.8–5.6)

## 2020-01-23 LAB — TSH: TSH: 1.87 u[IU]/mL (ref 0.450–4.500)

## 2020-01-23 LAB — PSA: Prostate Specific Ag, Serum: 5.5 ng/mL — ABNORMAL HIGH (ref 0.0–4.0)

## 2020-01-25 ENCOUNTER — Other Ambulatory Visit: Payer: Self-pay | Admitting: Medical

## 2020-01-25 DIAGNOSIS — R972 Elevated prostate specific antigen [PSA]: Secondary | ICD-10-CM

## 2020-01-26 ENCOUNTER — Telehealth: Payer: Self-pay | Admitting: Medical

## 2020-01-26 NOTE — Telephone Encounter (Signed)
Pt returned Genera's call, read him message in detail from [place] Pt expressed understanding

## 2020-02-01 ENCOUNTER — Ambulatory Visit: Payer: BC Managed Care – PPO | Admitting: Podiatry

## 2020-02-15 ENCOUNTER — Ambulatory Visit: Payer: BC Managed Care – PPO | Admitting: Podiatry

## 2020-02-19 ENCOUNTER — Telehealth: Payer: Self-pay

## 2020-02-19 NOTE — Telephone Encounter (Signed)
The fax number at his job is [phone].

## 2020-02-19 NOTE — Telephone Encounter (Signed)
Pt. Called stating that you filled out his FMLA paperwork for him. His job is now wanting a note form the doctor stating specifically what kind of work he can do such as a job sitting down due to the swelling in his feet and ankles due to his gout.

## 2020-02-22 NOTE — Telephone Encounter (Signed)
Lmom for patient to call for message from provider.  

## 2020-02-22 NOTE — Telephone Encounter (Signed)
I tried to explain this at his last visit, I am not an orthopedist or bone specialist.  I am not a pain management doctor.  I cannot make that call to determine what he can and cannot do at work.  That is basically asking me to verify he has a disability and how bad that disability is.  The proper person to make that decision would need to be an orthopedic doctor, pain management doctor, his podiatrist he already sees, or I can send him to a physical therapist to do what is called a functional assessment where they put him through a series of test to make that determination.  Thus, I cannot do what he is asking me to do, I am not qualified to do this, and our office in general does not make that type of decision.  I would recommend he ask this question to his podiatrist first.  If they have already said the same thing, then I can refer him to orthopedist.  He was supposed to have been referred to pain management.  Has he seen them yet?  If not what is the status for this referral?

## 2020-02-24 ENCOUNTER — Ambulatory Visit (INDEPENDENT_AMBULATORY_CARE_PROVIDER_SITE_OTHER): Payer: BC Managed Care – PPO | Admitting: Podiatry

## 2020-02-24 ENCOUNTER — Encounter: Payer: Self-pay | Admitting: Podiatry

## 2020-02-24 ENCOUNTER — Telehealth: Payer: Self-pay | Admitting: *Deleted

## 2020-02-24 ENCOUNTER — Other Ambulatory Visit: Payer: Self-pay

## 2020-02-24 DIAGNOSIS — M659 Synovitis and tenosynovitis, unspecified: Secondary | ICD-10-CM

## 2020-02-24 DIAGNOSIS — M19071 Primary osteoarthritis, right ankle and foot: Secondary | ICD-10-CM | POA: Diagnosis not present

## 2020-02-24 MED ORDER — OXYCODONE-ACETAMINOPHEN 5-325 MG PO TABS: 1.0000 | ORAL_TABLET | Freq: Three times a day (TID) | ORAL | 0 refills | Status: DC | PRN
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
## 2020-02-24 NOTE — Telephone Encounter (Signed)
Patient called and left a message that the medicine was not at the pharmacy when he went to pick it up and I left Dr Logan Bores a message. Misty Stanley

## 2020-02-25 ENCOUNTER — Telehealth: Payer: Self-pay | Admitting: *Deleted

## 2020-02-25 ENCOUNTER — Telehealth: Payer: Self-pay | Admitting: Podiatry

## 2020-02-25 DIAGNOSIS — M659 Synovitis and tenosynovitis, unspecified: Secondary | ICD-10-CM

## 2020-02-25 DIAGNOSIS — Z9889 Other specified postprocedural states: Secondary | ICD-10-CM

## 2020-02-25 DIAGNOSIS — M19071 Primary osteoarthritis, right ankle and foot: Secondary | ICD-10-CM

## 2020-02-25 NOTE — Telephone Encounter (Signed)
Faxed required form, clinicals and demographics to [facility]. 

## 2020-02-25 NOTE — Telephone Encounter (Signed)
I called pt and his medication was called to pharmacy at 1:38pm.

## 2020-02-25 NOTE — Telephone Encounter (Signed)
Pt has called back to see why his medication has not been sent over he states he needs a authorization from the doctor please advise

## 2020-02-25 NOTE — Telephone Encounter (Signed)
Pt called again stating he needs his medication pre authorized by Dr. Logan Bores. Please advise.

## 2020-02-25 NOTE — Telephone Encounter (Signed)
-----   Message from Felecia Shelling, DPM sent at 02/24/2020  1:59 PM EDT ----- Regarding: Pain management referral Please refer to pain management.  Diagnosis: Chronic right foot and ankle pain  Thanks, Dr. Logan Bores

## 2020-02-25 NOTE — Telephone Encounter (Signed)
Pt is stating that he needs a pre-authorization for his pain medication. This is his 3rd time calling today. Please call patient.

## 2020-02-26 ENCOUNTER — Telehealth: Payer: Self-pay | Admitting: Podiatry

## 2020-02-26 NOTE — Telephone Encounter (Signed)
I spoke with [facility] [address] states pt's insurance was not going to cover more than 7 days and pt states he will pay out of pocket.

## 2020-02-26 NOTE — Telephone Encounter (Signed)
Spoke to pt and explained that a pre authorization can take up to 3-5 bus days to get the medication per [facility]  And that pt could pay out of pocket to ask his pharmacy
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
## 2020-03-04 ENCOUNTER — Ambulatory Visit: Payer: BC Managed Care – PPO | Admitting: Cardiology

## 2020-03-17 ENCOUNTER — Telehealth: Payer: Self-pay | Admitting: Podiatry

## 2020-03-17 DIAGNOSIS — M19071 Primary osteoarthritis, right ankle and foot: Secondary | ICD-10-CM

## 2020-03-17 DIAGNOSIS — M659 Synovitis and tenosynovitis, unspecified: Secondary | ICD-10-CM

## 2020-03-17 DIAGNOSIS — Z9889 Other specified postprocedural states: Secondary | ICD-10-CM

## 2020-03-17 NOTE — Telephone Encounter (Signed)
Pt called to say that the pain management dr that Dr.Evans referred pt to does not take pts BCBS   Please advise

## 2020-03-17 NOTE — Telephone Encounter (Signed)
Faxed

## 2020-03-17 NOTE — Addendum Note (Signed)
Addended by: Alphia Kava D on: 03/17/2020 04:47 PM   Modules accepted: Orders

## 2020-03-25 ENCOUNTER — Encounter: Payer: Self-pay | Admitting: Physician Assistant

## 2020-03-25 ENCOUNTER — Other Ambulatory Visit: Payer: Self-pay

## 2020-03-25 ENCOUNTER — Encounter: Payer: Self-pay | Admitting: Internal Medicine

## 2020-03-25 ENCOUNTER — Ambulatory Visit (INDEPENDENT_AMBULATORY_CARE_PROVIDER_SITE_OTHER): Payer: BC Managed Care – PPO | Admitting: Physician Assistant

## 2020-03-25 VITALS — BP 140/68 | HR 84 | Temp 97.2°F | Ht 69.0 in | Wt 190.4 lb

## 2020-03-25 DIAGNOSIS — M1A9XX0 Chronic gout, unspecified, without tophus (tophi): Secondary | ICD-10-CM

## 2020-03-25 DIAGNOSIS — R972 Elevated prostate specific antigen [PSA]: Secondary | ICD-10-CM

## 2020-03-25 DIAGNOSIS — Z1211 Encounter for screening for malignant neoplasm of colon: Secondary | ICD-10-CM

## 2020-03-25 DIAGNOSIS — G8929 Other chronic pain: Secondary | ICD-10-CM

## 2020-03-25 DIAGNOSIS — I10 Essential (primary) hypertension: Secondary | ICD-10-CM | POA: Diagnosis not present

## 2020-03-25 DIAGNOSIS — M25571 Pain in right ankle and joints of right foot: Secondary | ICD-10-CM | POA: Diagnosis not present

## 2020-03-25 DIAGNOSIS — M21961 Unspecified acquired deformity of right lower leg: Secondary | ICD-10-CM

## 2020-03-25 DIAGNOSIS — R21 Rash and other nonspecific skin eruption: Secondary | ICD-10-CM

## 2020-03-25 MED ORDER — PREDNISONE 20 MG PO TABS: 40.0000 mg | ORAL_TABLET | Freq: Every day | ORAL | 0 refills | Status: AC
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
## 2020-03-25 NOTE — Patient Instructions (Signed)
It was great to see you!  1. Start oral prednisone for your gout and rash 2. Start daily claritin or allegra (this is available over the counter -- may use generic!) x 2 weeks 3. Call [facility] to follow-up on your prostate -- [phone] 4. You will be contacted about your pain management, orthopedics, and gastroenterology referral.  Let's follow-up in 3 months, sooner if you have concerns.  Take care,  Jarold Motto PA-C

## 2020-04-19 ENCOUNTER — Telehealth: Payer: Self-pay | Admitting: Physician Assistant

## 2020-04-19 NOTE — Telephone Encounter (Signed)
Patient is currently on blood thinners.  States he would like to get a COVID vac.  He was instructed to contact Samantha to see what COVID vac she recommends for him to take.

## 2020-04-19 NOTE — Telephone Encounter (Signed)
When you get a chance can you please look into medication management, Urology and orthopedic surgery referral for patient? Thanks TP

## 2020-04-19 NOTE — Telephone Encounter (Signed)
Called pt and LVM for pt to call office.

## 2020-04-19 NOTE — Telephone Encounter (Signed)
Please advise 

## 2020-04-19 NOTE — Telephone Encounter (Signed)
Pt notified of recommendations

## 2020-04-19 NOTE — Telephone Encounter (Signed)
Recommend Moderna or [employer].

## 2020-04-22 ENCOUNTER — Encounter: Payer: BC Managed Care – PPO | Admitting: Internal Medicine

## 2020-04-29 ENCOUNTER — Ambulatory Visit: Payer: BC Managed Care – PPO | Admitting: Orthopaedic Surgery

## 2020-04-30 ENCOUNTER — Other Ambulatory Visit: Payer: Self-pay | Admitting: Medical

## 2020-05-02 NOTE — Telephone Encounter (Signed)
Called and spoke to pt and he has transferred out to another office

## 2020-05-03 ENCOUNTER — Other Ambulatory Visit: Payer: Self-pay | Admitting: Medical

## 2020-05-07 ENCOUNTER — Other Ambulatory Visit: Payer: Self-pay | Admitting: Medical

## 2020-05-10 ENCOUNTER — Telehealth: Payer: Self-pay | Admitting: Physician Assistant

## 2020-05-10 DIAGNOSIS — Z86718 Personal history of other venous thrombosis and embolism: Secondary | ICD-10-CM

## 2020-05-10 DIAGNOSIS — I10 Essential (primary) hypertension: Secondary | ICD-10-CM

## 2020-05-10 MED ORDER — METOPROLOL TARTRATE 50 MG PO TABS: ORAL_TABLET | ORAL | 1 refills | Status: DC

## 2020-05-10 MED ORDER — APIXABAN 5 MG PO TABS: ORAL_TABLET | ORAL | 1 refills | Status: DC

## 2020-05-10 NOTE — Telephone Encounter (Signed)
Patient states his insurance will not cover ELIQUIS 5 MG TABS tablet and he needs this medication.patient also states the pharmacy had told him he has a RX ready for metoprolol tartrate (LOPRESSOR) 50 MG tablet and 25 mg tablet. Patient would like to clarify if he needs both or jusy the 50 mg Thank you !

## 2020-05-10 NOTE — Telephone Encounter (Signed)
Medications sent

## 2020-05-12 ENCOUNTER — Telehealth: Payer: Self-pay

## 2020-05-12 NOTE — Telephone Encounter (Signed)
Please advise 

## 2020-05-12 NOTE — Telephone Encounter (Signed)
Pt is following up on a prostate referral that Lelon Mast mentioned to him.

## 2020-05-12 NOTE — Telephone Encounter (Signed)
Pt calling in regards to pain management referral.  Called [facility] to check the status. They are currently behind on referrals. Patient notified.

## 2020-05-13 ENCOUNTER — Ambulatory Visit: Payer: BC Managed Care – PPO | Admitting: Gastroenterology

## 2020-05-13 ENCOUNTER — Ambulatory Visit (INDEPENDENT_AMBULATORY_CARE_PROVIDER_SITE_OTHER): Payer: BC Managed Care – PPO | Admitting: Orthopaedic Surgery

## 2020-05-13 ENCOUNTER — Encounter: Payer: Self-pay | Admitting: Orthopaedic Surgery

## 2020-05-13 ENCOUNTER — Ambulatory Visit: Payer: Self-pay

## 2020-05-13 DIAGNOSIS — M25571 Pain in right ankle and joints of right foot: Secondary | ICD-10-CM | POA: Diagnosis not present
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
## 2020-05-23 ENCOUNTER — Ambulatory Visit (INDEPENDENT_AMBULATORY_CARE_PROVIDER_SITE_OTHER): Payer: BC Managed Care – PPO | Admitting: Podiatry

## 2020-05-23 ENCOUNTER — Other Ambulatory Visit: Payer: Self-pay

## 2020-05-23 DIAGNOSIS — M19071 Primary osteoarthritis, right ankle and foot: Secondary | ICD-10-CM | POA: Diagnosis not present

## 2020-05-23 DIAGNOSIS — M659 Synovitis and tenosynovitis, unspecified: Secondary | ICD-10-CM | POA: Diagnosis not present

## 2020-05-23 DIAGNOSIS — M79671 Pain in right foot: Secondary | ICD-10-CM

## 2020-05-23 DIAGNOSIS — Z9889 Other specified postprocedural states: Secondary | ICD-10-CM | POA: Diagnosis not present

## 2020-05-23 MED ORDER — OXYCODONE-ACETAMINOPHEN 10-325 MG PO TABS: 1.0000 | ORAL_TABLET | Freq: Three times a day (TID) | ORAL | 0 refills | Status: AC | PRN
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
## 2020-06-10 ENCOUNTER — Ambulatory Visit: Payer: BC Managed Care – PPO | Admitting: Physician Assistant

## 2020-06-17 ENCOUNTER — Ambulatory Visit: Payer: BC Managed Care – PPO | Admitting: Gastroenterology

## 2020-06-21 ENCOUNTER — Telehealth: Payer: Self-pay | Admitting: Podiatry

## 2020-06-21 NOTE — Telephone Encounter (Signed)
Pt called and stated that he has not heard from pain management at all and he wanted to follow up. Please give patient a call.

## 2020-06-22 ENCOUNTER — Telehealth: Payer: Self-pay | Admitting: Podiatry

## 2020-06-22 NOTE — Telephone Encounter (Signed)
Pt called back again because he has not heard anything from pain management.

## 2020-06-23 ENCOUNTER — Encounter: Payer: Self-pay | Admitting: Gastroenterology

## 2020-06-24 ENCOUNTER — Ambulatory Visit: Payer: BC Managed Care – PPO | Admitting: Physician Assistant
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
## 2020-06-28 ENCOUNTER — Other Ambulatory Visit: Payer: Self-pay

## 2020-06-28 DIAGNOSIS — M19071 Primary osteoarthritis, right ankle and foot: Secondary | ICD-10-CM

## 2020-06-29 ENCOUNTER — Telehealth: Payer: Self-pay

## 2020-06-29 NOTE — Telephone Encounter (Signed)
0

## 2020-06-30 ENCOUNTER — Telehealth: Payer: Self-pay

## 2020-06-30 ENCOUNTER — Telehealth: Payer: Self-pay | Admitting: Podiatry

## 2020-06-30 ENCOUNTER — Other Ambulatory Visit: Payer: Self-pay | Admitting: Podiatry

## 2020-06-30 MED ORDER — OXYCODONE-ACETAMINOPHEN 5-325 MG PO TABS: 1.0000 | ORAL_TABLET | Freq: Three times a day (TID) | ORAL | 0 refills | Status: DC | PRN

## 2020-06-30 NOTE — Telephone Encounter (Signed)
Patient called back to ask about pain management. I did inform him that the referral was sent and we are waiting to hear back. He also is requesting pain meds. Patient states that he is in severe pain.ev

## 2020-06-30 NOTE — Telephone Encounter (Signed)
Patient said the pharmacy faxed over a paper and are waiting on answers to additional questions before they will fill his prescription. He is wanting to get his pain medication.

## 2020-06-30 NOTE — Telephone Encounter (Signed)
Renea Ee CMA has resend the information to pain management. Also she has reached out the pharmacy about the pt pain medication.

## 2020-06-30 NOTE — Telephone Encounter (Signed)
Pain medication sent to pharmacy

## 2020-06-30 NOTE — Telephone Encounter (Signed)
Rx sent 

## 2020-06-30 NOTE — Progress Notes (Signed)
PRN chronic foot/ankle pain ?

## 2020-07-01 ENCOUNTER — Telehealth: Payer: Self-pay

## 2020-07-01 NOTE — Telephone Encounter (Signed)
THANK YOU

## 2020-07-08 ENCOUNTER — Ambulatory Visit: Payer: BC Managed Care – PPO | Admitting: Physician Assistant

## 2020-07-11 ENCOUNTER — Telehealth: Payer: Self-pay | Admitting: Podiatry

## 2020-07-11 NOTE — Telephone Encounter (Signed)
Patient said he was referred to a pain clinic in [place] and he cannot go to a clinic in [place] because he cannot get there. Is there somewhere more local he can go to?

## 2020-07-13 ENCOUNTER — Telehealth: Payer: Self-pay

## 2020-07-13 ENCOUNTER — Encounter: Payer: Self-pay | Admitting: Physical Medicine and Rehabilitation

## 2020-07-13 ENCOUNTER — Telehealth: Payer: Self-pay | Admitting: Podiatry

## 2020-07-13 NOTE — Telephone Encounter (Signed)
Patient called because he called the pain clinic he was told to called and they said their clinic is not recievieing the faxes. Please resend.

## 2020-07-22 ENCOUNTER — Ambulatory Visit: Payer: BC Managed Care – PPO | Admitting: Gastroenterology

## 2020-07-29 ENCOUNTER — Encounter: Payer: Self-pay | Admitting: Physician Assistant

## 2020-07-29 ENCOUNTER — Ambulatory Visit (INDEPENDENT_AMBULATORY_CARE_PROVIDER_SITE_OTHER): Payer: BC Managed Care – PPO | Admitting: Physician Assistant

## 2020-07-29 ENCOUNTER — Other Ambulatory Visit: Payer: Self-pay

## 2020-07-29 VITALS — BP 122/74 | HR 74 | Temp 98.2°F | Ht 69.0 in | Wt 193.0 lb

## 2020-07-29 DIAGNOSIS — M21961 Unspecified acquired deformity of right lower leg: Secondary | ICD-10-CM | POA: Diagnosis not present

## 2020-07-29 DIAGNOSIS — G8929 Other chronic pain: Secondary | ICD-10-CM | POA: Diagnosis not present

## 2020-07-29 DIAGNOSIS — M25571 Pain in right ankle and joints of right foot: Secondary | ICD-10-CM

## 2020-07-29 DIAGNOSIS — I10 Essential (primary) hypertension: Secondary | ICD-10-CM

## 2020-07-29 MED ORDER — HYDROCODONE-ACETAMINOPHEN 10-325 MG PO TABS: 1.0000 | ORAL_TABLET | Freq: Three times a day (TID) | ORAL | 0 refills | Status: DC | PRN

## 2020-07-29 MED ORDER — METOPROLOL TARTRATE 50 MG PO TABS: ORAL_TABLET | ORAL | 1 refills | Status: DC

## 2020-07-29 NOTE — Patient Instructions (Signed)
It was great to see you!  I have completed your FMLA paperwork today.  I have sent in pain medication for 10 days. No further refills from this office -- they must come from pain management.  Please schedule an appointment for Physical Therapy here at our office on your way out.  Let's follow-up in 6 months, sooner if you have concerns.  Take care,  Jarold Motto PA-C
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
## 2020-08-01 ENCOUNTER — Other Ambulatory Visit: Payer: Self-pay | Admitting: Physician Assistant

## 2020-08-01 ENCOUNTER — Telehealth: Payer: Self-pay

## 2020-08-01 MED ORDER — OXYCODONE-ACETAMINOPHEN 10-325 MG PO TABS
1.0000 | ORAL_TABLET | Freq: Three times a day (TID) | ORAL | 0 refills | Status: AC | PRN
Start: 2020-08-01 — End: 2020-08-06

## 2020-08-01 NOTE — Telephone Encounter (Signed)
Spoke to pt asked him if he is wanting Oxycodone-acetaminophen 10-325 mg. Pt said yes can not take Vicodin. Told pt okay, Lelon Mast said she will send enough in till your appointment with pain management. Pt verbalized understanding.

## 2020-08-01 NOTE — Telephone Encounter (Signed)
Please see message and advise 

## 2020-08-01 NOTE — Telephone Encounter (Signed)
Patient is calling in stating that he was prescribed Vicodin last week, and states that he told Lelon Mast that he couldn't take it because it makes his stomach hurt. Sandro was asking if he could get a refill on a medication that was prescribed before, but could not remember the name of it.

## 2020-08-01 NOTE — Telephone Encounter (Signed)
Alexander [name], pt would like Oxycodone-acetaminophen 10-325 mg.

## 2020-08-01 NOTE — Telephone Encounter (Signed)
Please clarify with patient.  Is he requesting Percocet instead (oxycodone -- acetaminophen)?  I will fill for him to get through to Friday.

## 2020-08-01 NOTE — Telephone Encounter (Signed)
Sent!

## 2020-08-01 NOTE — Telephone Encounter (Signed)
Noted, pt aware.

## 2020-08-08 ENCOUNTER — Ambulatory Visit: Payer: BC Managed Care – PPO | Admitting: Student in an Organized Health Care Education/Training Program

## 2020-08-08 ENCOUNTER — Encounter: Payer: Self-pay | Admitting: Physician Assistant

## 2020-08-08 ENCOUNTER — Ambulatory Visit (INDEPENDENT_AMBULATORY_CARE_PROVIDER_SITE_OTHER): Payer: BC Managed Care – PPO | Admitting: Physician Assistant

## 2020-08-08 VITALS — BP 140/80 | HR 83 | Temp 97.8°F | Ht 69.0 in | Wt 189.5 lb

## 2020-08-08 DIAGNOSIS — M545 Low back pain, unspecified: Secondary | ICD-10-CM

## 2020-08-08 LAB — POCT URINALYSIS DIPSTICK
Bilirubin, UA: NEGATIVE
Blood, UA: NEGATIVE
Glucose, UA: NEGATIVE
Ketones, UA: NEGATIVE
Leukocytes, UA: NEGATIVE
Nitrite, UA: NEGATIVE
Protein, UA: NEGATIVE
Spec Grav, UA: 1.025 (ref 1.010–1.025)
Urobilinogen, UA: 0.2 E.U./dL
pH, UA: 6 (ref 5.0–8.0)

## 2020-08-08 MED ORDER — METHYLPREDNISOLONE ACETATE 80 MG/ML IJ SUSP
80.0000 mg | Freq: Once | INTRAMUSCULAR | Status: AC
  Administered 2020-08-08: 80 mg via INTRAMUSCULAR

## 2020-08-08 MED ORDER — PREDNISONE 20 MG PO TABS: 40.0000 mg | ORAL_TABLET | Freq: Every day | ORAL | 0 refills | Status: DC

## 2020-08-08 NOTE — Patient Instructions (Signed)
It was great to see you!  You may start the additional oral prednisone tomorrow if you feel like you need more inflammation relief.  An order for an xray has been put in for you. To get your xray, you can walk in at the [facility] location without a scheduled appointment.  The address is [address] It is across the street from [facility]. X-ray is located in the basement.  Hours of operation are M-F 8:30am to 5:00pm. Please note that they are closed for lunch between 12:30 and 1:00pm.  If you develop: Fever, chills, vomiting, worsening pain, blood in urine/stool, or other concerns --> GO TO THE ER  Jarold Motto PA-C
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
## 2020-08-12 ENCOUNTER — Encounter
Payer: BC Managed Care – PPO | Attending: Physical Medicine and Rehabilitation | Admitting: Physical Medicine and Rehabilitation

## 2020-08-12 ENCOUNTER — Other Ambulatory Visit: Payer: Self-pay

## 2020-08-12 ENCOUNTER — Encounter: Payer: Self-pay | Admitting: Physical Medicine and Rehabilitation

## 2020-08-12 VITALS — BP 135/80 | HR 81 | Temp 98.1°F | Ht 70.0 in | Wt 190.0 lb

## 2020-08-12 DIAGNOSIS — G4701 Insomnia due to medical condition: Secondary | ICD-10-CM | POA: Diagnosis not present

## 2020-08-12 DIAGNOSIS — I82451 Acute embolism and thrombosis of right peroneal vein: Secondary | ICD-10-CM | POA: Insufficient documentation

## 2020-08-12 DIAGNOSIS — Z9889 Other specified postprocedural states: Secondary | ICD-10-CM | POA: Insufficient documentation

## 2020-08-12 MED ORDER — GABAPENTIN 100 MG PO CAPS: 100.0000 mg | ORAL_CAPSULE | Freq: Three times a day (TID) | ORAL | 1 refills | Status: DC

## 2020-08-12 MED ORDER — AMITRIPTYLINE HCL 10 MG PO TABS: 10.0000 mg | ORAL_TABLET | Freq: Every day | ORAL | 1 refills | Status: DC

## 2020-08-12 NOTE — Patient Instructions (Signed)
-  Discussed following foods that may reduce pain: 1) Ginger 2) Blueberries 3) Salmon 4) Pumpkin seeds 5) dark chocolate 6) turmeric 7) tart cherries 8) virgin olive oil 9) chilli peppers 10) mint 11) red wine .   
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
## 2020-08-15 ENCOUNTER — Ambulatory Visit: Payer: BC Managed Care – PPO | Admitting: Physical Therapy

## 2020-08-19 ENCOUNTER — Ambulatory Visit: Payer: BC Managed Care – PPO | Admitting: Physician Assistant

## 2020-08-27 ENCOUNTER — Other Ambulatory Visit: Payer: Self-pay | Admitting: Medical

## 2020-08-27 DIAGNOSIS — Z86718 Personal history of other venous thrombosis and embolism: Secondary | ICD-10-CM

## 2020-08-29 ENCOUNTER — Telehealth: Payer: Self-pay

## 2020-08-29 DIAGNOSIS — Z86718 Personal history of other venous thrombosis and embolism: Secondary | ICD-10-CM

## 2020-08-29 MED ORDER — APIXABAN 5 MG PO TABS: ORAL_TABLET | ORAL | 1 refills | Status: DC

## 2020-08-29 NOTE — Telephone Encounter (Signed)
Patient called in stating his work faxed over work accommodations paper on Wednesday and is asking if this can be filled out as soon as possible.

## 2020-08-29 NOTE — Telephone Encounter (Signed)
Rx sent to pharmacy   

## 2020-08-29 NOTE — Telephone Encounter (Signed)
Please call pharmacy.  It appears the Eliquis was refilled by cardiology.  The allopurinol was refilled in January with 3 refills 90-day supply which should get him through till next January 2022.  Thus I am not sure why got a refill request on allopurinol.  Please call pharmacy to clarify

## 2020-08-29 NOTE — Telephone Encounter (Signed)
  LAST APPOINTMENT DATE: 08/15/2020   NEXT APPOINTMENT DATE:@12 /20/2021  MEDICATION:apixaban (ELIQUIS) 5 MG TABS tablet  PHARMACY: [id number] - [place], [place] - [address]  Comments: Patient states pharmacy is needing a prior authorization   Please advise

## 2020-08-29 NOTE — Telephone Encounter (Signed)
Does this need to be refilled by cardiology?

## 2020-08-31 NOTE — Telephone Encounter (Signed)
Patient returned call stating we also filled out FMLA paperwork. Found copy in chart and printed it and form given back to provider to be filled out.

## 2020-08-31 NOTE — Telephone Encounter (Signed)
Pt is calling following up on these papers.

## 2020-08-31 NOTE — Telephone Encounter (Signed)
LM notifying pt that I am faxing this paperwork to his podiatrist Dr Gala Lewandowsky since he is the one completing his FMLA and to follow up with that office.

## 2020-08-31 NOTE — Telephone Encounter (Signed)
Patient would like to add the deadline for paperwork to be sent over is tomorrow 12/2

## 2020-08-31 NOTE — Telephone Encounter (Signed)
Pt notified forms faxed.

## 2020-09-01 ENCOUNTER — Telehealth: Payer: Self-pay | Admitting: Podiatry

## 2020-09-01 NOTE — Telephone Encounter (Signed)
I have accommodation forms for pt. I need assistance with..   Is pt able to perform all functions of his job safely?  How long is this condition likely to last, FMLA ran out 11/30?  Does impairment cause permanent restrictions?  Does the impairment cause episodic restrictions?

## 2020-09-05 ENCOUNTER — Telehealth: Payer: Self-pay

## 2020-09-05 MED ORDER — ALLOPURINOL 100 MG PO TABS
100.0000 mg | ORAL_TABLET | Freq: Every day | ORAL | 3 refills | Status: DC
Start: 2020-09-05 — End: 2021-09-15

## 2020-09-05 NOTE — Telephone Encounter (Signed)
Allopurinol sent to pharmacy.

## 2020-09-05 NOTE — Telephone Encounter (Signed)
..   LAST APPOINTMENT DATE: 08/29/2020   NEXT APPOINTMENT DATE:@12 /20/2021  MEDICATION:allopurinol (ZYLOPRIM) 100 MG tablet

## 2020-09-06 ENCOUNTER — Telehealth: Payer: Self-pay

## 2020-09-06 NOTE — Telephone Encounter (Signed)
Spoke to pt, went over [facility] paperwork changed and added some information per pt. He will get back to me for answers for some of the questions.

## 2020-09-06 NOTE — Telephone Encounter (Signed)
Tried to contact pt no answer and voicemail box has not been set up yet.

## 2020-09-06 NOTE — Telephone Encounter (Signed)
Pt would like a call in regards to his FMLA papwerwork. The FMLA " people" are not satisfied with what was filled out.

## 2020-09-07 NOTE — Telephone Encounter (Signed)
Left detailed message on voicemail that I faxed his papers again and they went through. Any questions please call the office.

## 2020-09-07 NOTE — Telephone Encounter (Signed)
Pt called back and asked to add in section # 3 question D # 4 not allowed to lift more than 15-20 pounds. Told pt okay done will refax. Pt verbalized understanding and asked me to let him know when it has been faxed. Told him okay.

## 2020-09-09 ENCOUNTER — Ambulatory Visit (INDEPENDENT_AMBULATORY_CARE_PROVIDER_SITE_OTHER): Payer: BC Managed Care – PPO | Admitting: Nurse Practitioner

## 2020-09-09 ENCOUNTER — Encounter: Payer: Self-pay | Admitting: Nurse Practitioner

## 2020-09-09 VITALS — BP 130/80 | HR 84 | Ht 67.25 in | Wt 195.0 lb

## 2020-09-09 DIAGNOSIS — Z1211 Encounter for screening for malignant neoplasm of colon: Secondary | ICD-10-CM

## 2020-09-09 DIAGNOSIS — Z7901 Long term (current) use of anticoagulants: Secondary | ICD-10-CM | POA: Diagnosis not present

## 2020-09-09 NOTE — Progress Notes (Signed)
Assessment and plan reviewed 

## 2020-09-09 NOTE — Patient Instructions (Signed)
If you are age 58 or older, your body mass index should be between 23-30. Your Body mass index is 30.31 kg/m. If this is out of the aforementioned range listed, please consider follow up with your Primary Care Provider.  If you are age 77 or younger, your body mass index should be between 19-25. Your Body mass index is 30.31 kg/m. If this is out of the aformentioned range listed, please consider follow up with your Primary Care Provider.   It has been recommended to you by your physician that you have a(n) Colonoscopy completed. Per your request, we did not schedule the procedure(s) today. Please contact our office at [phone] should you decide to have the procedure completed. You will be scheduled for a pre-visit and procedure at that time. We will contact you when the March schedule comes out.  Thank you for entrusting me with your care and choosing [facility].  Willette Cluster, NP
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
## 2020-09-19 ENCOUNTER — Ambulatory Visit: Payer: BC Managed Care – PPO | Admitting: Physical Therapy

## 2020-09-28 ENCOUNTER — Encounter: Payer: BC Managed Care – PPO | Admitting: Physical Medicine and Rehabilitation

## 2020-09-28 ENCOUNTER — Ambulatory Visit (INDEPENDENT_AMBULATORY_CARE_PROVIDER_SITE_OTHER): Payer: BC Managed Care – PPO | Admitting: Physician Assistant

## 2020-09-28 ENCOUNTER — Other Ambulatory Visit: Payer: Self-pay

## 2020-09-28 ENCOUNTER — Encounter: Payer: Self-pay | Admitting: Physician Assistant

## 2020-09-28 VITALS — BP 118/78 | HR 82 | Temp 97.1°F | Ht 67.25 in | Wt 193.2 lb

## 2020-09-28 DIAGNOSIS — M1A9XX0 Chronic gout, unspecified, without tophus (tophi): Secondary | ICD-10-CM

## 2020-09-28 MED ORDER — PREDNISONE 20 MG PO TABS: 40.0000 mg | ORAL_TABLET | Freq: Every day | ORAL | 0 refills | Status: DC

## 2020-09-28 NOTE — Patient Instructions (Signed)
It was great to see you!  Start oral prednisone for your gout flare.  Be sure to go to your appointment today.  Take care,  Jarold Motto PA-C
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
## 2020-11-04 ENCOUNTER — Encounter: Payer: BC Managed Care – PPO | Admitting: Physical Medicine and Rehabilitation

## 2020-11-09 ENCOUNTER — Telehealth: Payer: Self-pay

## 2020-11-09 DIAGNOSIS — Z86718 Personal history of other venous thrombosis and embolism: Secondary | ICD-10-CM

## 2020-11-09 NOTE — Telephone Encounter (Signed)
Pt called stating his insurance is not covering his eliquis. Pt asked if he would need a different dosage that may be covered or a different prescription. Please advise.   Pt was given 20 pills at pharmacy to carry him over.

## 2020-11-10 MED ORDER — RIVAROXABAN 20 MG PO TABS: 20.0000 mg | ORAL_TABLET | Freq: Every day | ORAL | 2 refills | Status: DC

## 2020-11-10 NOTE — Telephone Encounter (Signed)
Patient is following up regarding medication

## 2020-11-10 NOTE — Telephone Encounter (Signed)
Spoke to pt told him switching medication from Eliquis to Xarelto due to insurance will not cover Eliquis I called insurance and will cover Xarelto. Pt verbalized understanding. Told pt you will take Xarelto 20 mg daily. Rx sent to pharmacy. Pt verbalized understanding.

## 2020-11-10 NOTE — Telephone Encounter (Signed)
Asked Dr. Jimmey Ralph what dosage of Xarelto do I order for pt who was on Eliquis 5 mg twice a day and have to switch due to not covered by insurance. Dr.Parker gave verbal order Xarelto 20 mg daily. Rx sent to pharmacy.

## 2020-11-10 NOTE — Telephone Encounter (Signed)
Received fax from [facility] PA needed for Eliquis no longer covered by insurance. Called pt's plan at [phone] and spoke to Star, told her calling to see if Xarelto would be covered by insurance since Eliquis is no longer covered. Star said Xarelto would be covered. Told her okay thank you.

## 2020-11-11 ENCOUNTER — Telehealth: Payer: Self-pay

## 2020-11-11 NOTE — Telephone Encounter (Signed)
Prior authorization started thru Cover my meds website for patient Xarelto 20 mg. Key: [id number] Waiting on response from insurance co.

## 2020-11-11 NOTE — Telephone Encounter (Signed)
Patient is following up about this. I explained to patient that it would have to wait till Monday when Lelon Mast came back to office    Explained to patient that he could call his insurance company and try to talk to them. He denied and said he will wait till Monday to see what we figured out.   FYI

## 2020-11-11 NOTE — Telephone Encounter (Signed)
rivaroxaban (XARELTO) 20 MG TABS tablet  Pt states that he tried to pick this up from pharmacy and he was told it would cost him over $100. Pt wants to know if we can contact insurance and get it lower.

## 2020-11-15 MED ORDER — RIVAROXABAN 20 MG PO TABS
20.0000 mg | ORAL_TABLET | Freq: Every day | ORAL | 1 refills | Status: DC
Start: 2020-11-15 — End: 2021-02-22

## 2020-11-15 MED ORDER — RIVAROXABAN 20 MG PO TABS: 20.0000 mg | ORAL_TABLET | Freq: Every day | ORAL | 0 refills | Status: DC

## 2020-11-15 NOTE — Telephone Encounter (Signed)
Pt is following up on this and states he still hasn't received any Xalrelto.

## 2020-11-15 NOTE — Telephone Encounter (Signed)
Pt called back told him I have samples of Xarelto 20 mg and coupon to try to get Rx cheaper lets see if that works. Pt verbalized understanding will pick up.

## 2020-11-15 NOTE — Addendum Note (Signed)
Addended by: Jimmye Norman on: 11/15/2020 04:50 PM   Modules accepted: Orders

## 2020-11-15 NOTE — Telephone Encounter (Signed)
Pt returned call and was asking questions I was unable to answer. Told pt I would have someone call him back.

## 2020-11-15 NOTE — Telephone Encounter (Signed)
Gerilyn Pilgrim, here is the patient I was talking about. Thanks

## 2020-11-15 NOTE — Telephone Encounter (Signed)
Alexander Leash- routing back per request

## 2020-11-15 NOTE — Telephone Encounter (Signed)
Spoke to pt told him there is not much I can do about the price for Xarelto. Our pharmacist is going to look into your chart to see if there is something he can do and will be calling you. I do have a coupon but not sure it will work. Pt verbalized understanding. Asked pt if he has any eliquis left? Pt said he took his last pill last night. Told pt okay will get back to you later. Pt verbalized understanding.

## 2020-11-15 NOTE — Telephone Encounter (Signed)
Left message on voicemail to call office.  

## 2020-11-22 ENCOUNTER — Telehealth: Payer: Self-pay | Admitting: *Deleted

## 2020-11-22 NOTE — Telephone Encounter (Signed)
Spoke to pt told him calling to follow up to make sure you are able to get the prescription Xarelto we gave you. Pt said he has not tried yet due to has pills left. Told pt need to make sure Rx will go through before you run out, don't want you to be with out medication. Pt said he will go to the pharmacy.

## 2020-11-28 ENCOUNTER — Telehealth: Payer: Self-pay

## 2020-11-28 NOTE — Telephone Encounter (Signed)
[facility] Pre-operative Risk Assessment     Request for surgical clearance:     Endoscopy Procedure  What type of surgery is being performed?     Colonoscopy  When is this surgery scheduled?     12/09/2020  What type of clearance is required ?   Pharmacy  Are there any medications that need to be held prior to surgery and how long? Xarelto starting 2 days prior  Practice name and name of physician performing surgery?      [facility]  What is your office phone and fax number?      Phone- [phone]  [phone]  Anesthesia type (None, local, MAC, general) ?       MAC

## 2020-11-30 ENCOUNTER — Telehealth: Payer: Self-pay

## 2020-11-30 NOTE — Telephone Encounter (Signed)
Left Voicemail for patient to return phone call. Need to discuss patient's prep and that he needs to pick up instructions at the front desk.

## 2020-12-01 NOTE — Telephone Encounter (Signed)
Left message for patient to return phone call about prep or we may need to cancel Colonoscopy.

## 2020-12-01 NOTE — Telephone Encounter (Signed)
Samantha, please see message. 

## 2020-12-01 NOTE — Telephone Encounter (Signed)
Spoke with patient advised that his information with his Colonoscopy Prep would be at the front desk. Let him know that he would need to get Miralax, Gatorade and dulcolax. I let him know that packet would let him know how much to get. I also let him the pack would advise him when to stop solid foods, which is the day before the procedure and that he would need to stop his liquids 3 hours prior to his procedure. He expressed that he understood and would be picking up his packet tomorrow. I also let him that as soon as I heard back from his PCP about holding his anti-coag I would call and let him know.

## 2020-12-01 NOTE — Telephone Encounter (Signed)
[facility] is following up on this.

## 2020-12-02 NOTE — Telephone Encounter (Signed)
Ok to hold Xarelto two days prior to procedure. Plan to restart day after procedure.  Please tell patient, strict precautions -- if any new onset lower leg pain/swelling, chest pain, SOB, needs immediate evaluation in ER.

## 2020-12-02 NOTE — Telephone Encounter (Signed)
Left message for patient to return my call.

## 2020-12-06 NOTE — Telephone Encounter (Signed)
Tried to reach patient, no answer voicemail full. Contacted his son to have him call and let the patient know that we have cancelled his appointment due to him know getting his instructions. I let the son know that his voicemail is full. The son advised that he will try to reach his father and let him know.   Since we have a clearance for his Xarelto. The patient will now need to schedule with Pre-op.

## 2020-12-06 NOTE — Telephone Encounter (Signed)
Left voicemail to advise patient that we will be canceling his appointment this Friday since he did not pick up his instructions as directed last week he has also not  Returned call to be instructed about his anti coagulant.

## 2020-12-07 ENCOUNTER — Ambulatory Visit (INDEPENDENT_AMBULATORY_CARE_PROVIDER_SITE_OTHER): Payer: BC Managed Care – PPO | Admitting: Physician Assistant

## 2020-12-07 ENCOUNTER — Encounter: Payer: Self-pay | Admitting: Physician Assistant

## 2020-12-07 ENCOUNTER — Other Ambulatory Visit: Payer: Self-pay

## 2020-12-07 VITALS — BP 110/66 | HR 75 | Temp 97.5°F | Ht 67.25 in | Wt 196.2 lb

## 2020-12-07 DIAGNOSIS — G629 Polyneuropathy, unspecified: Secondary | ICD-10-CM

## 2020-12-07 DIAGNOSIS — G8929 Other chronic pain: Secondary | ICD-10-CM

## 2020-12-07 DIAGNOSIS — M25571 Pain in right ankle and joints of right foot: Secondary | ICD-10-CM

## 2020-12-07 DIAGNOSIS — R238 Other skin changes: Secondary | ICD-10-CM | POA: Diagnosis not present

## 2020-12-07 MED ORDER — GABAPENTIN 100 MG PO CAPS: 100.0000 mg | ORAL_CAPSULE | Freq: Three times a day (TID) | ORAL | 1 refills | Status: DC

## 2020-12-07 NOTE — Patient Instructions (Signed)
It was great to see you!  Please clear your voicemails! The gastroenterologist group has been trying to call you and we will need to call you to schedule an ultrasound for your leg.  I want to increase your gabapentin to 200 mg three times a day. I will send in new script for this medication.  Keep an eye on the skin on your foot. If it starts to look infected, please come see me.   Take care,  Jarold Motto PA-C
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
## 2020-12-09 ENCOUNTER — Encounter: Payer: BC Managed Care – PPO | Admitting: Internal Medicine

## 2020-12-23 ENCOUNTER — Encounter: Payer: BC Managed Care – PPO | Admitting: Physical Medicine and Rehabilitation

## 2020-12-30 ENCOUNTER — Telehealth: Payer: Self-pay

## 2020-12-30 NOTE — Telephone Encounter (Signed)
Patient is calling in stating that his pharmacy is needing a prior authorization for the medication rivaroxaban (XARELTO) 20 MG TABS tablet, and that his insurance isnt covering it.

## 2021-01-03 NOTE — Telephone Encounter (Signed)
Called [facility] spoke to [facility] regarding PA for Xarelto 20 mg. Ashok Cordia said pt does not need PA for Xarelto and that it was filled yesterday for 30 day supply, insurance will only fill 30 days at a time. Told her okay thanks. I will check with pt. Marissa verbalized understanding.

## 2021-01-03 NOTE — Telephone Encounter (Signed)
Left message on voicemail to call office.  

## 2021-01-26 ENCOUNTER — Encounter (HOSPITAL_COMMUNITY): Payer: Self-pay

## 2021-02-10 ENCOUNTER — Encounter
Payer: BC Managed Care – PPO | Attending: Physical Medicine and Rehabilitation | Admitting: Physical Medicine and Rehabilitation

## 2021-02-10 ENCOUNTER — Telehealth: Payer: Self-pay

## 2021-02-10 ENCOUNTER — Encounter: Payer: Self-pay | Admitting: Vascular Surgery

## 2021-02-10 ENCOUNTER — Ambulatory Visit (HOSPITAL_COMMUNITY)
Admission: RE | Admit: 2021-02-10 | Discharge: 2021-02-10 | Disposition: A | Discharge status: Home / Self Care | Payer: BC Managed Care – PPO | Source: Ambulatory Visit | Attending: Physician Assistant | Admitting: Physician Assistant

## 2021-02-10 ENCOUNTER — Other Ambulatory Visit: Payer: Self-pay

## 2021-02-10 DIAGNOSIS — G629 Polyneuropathy, unspecified: Secondary | ICD-10-CM | POA: Diagnosis present

## 2021-02-10 NOTE — Telephone Encounter (Signed)
Pt called asking if Lelon Mast could adjust him FMLA paperwork. Pt is going to fax over the information. Pt asked if his hours could be changed to working 8 hours for 7 days a week and to work longer hours if he feels like it. Pt also asked if FMLA could be changed to 6-7 days off per month for pain & suffering. Please advise.

## 2021-02-12 ENCOUNTER — Other Ambulatory Visit: Payer: Self-pay | Admitting: Family

## 2021-02-12 DIAGNOSIS — G8929 Other chronic pain: Secondary | ICD-10-CM

## 2021-02-14 ENCOUNTER — Telehealth: Payer: Self-pay

## 2021-02-14 NOTE — Telephone Encounter (Signed)
LVM for patient to call back and schedule appt with Dr. Jimmey Ralph to discuss FMLA.

## 2021-02-14 NOTE — Telephone Encounter (Signed)
Discussed pt with Dr. Jimmey Ralph, his last FMLA paper work was filled out by another provider we only filled out accomodation form. Pt has not been seen in office since 11/2020. Dr. Jimmey Ralph said pt will need an appt to fill out FMLA paperwork for him.

## 2021-02-14 NOTE — Telephone Encounter (Signed)
error 

## 2021-02-14 NOTE — Telephone Encounter (Signed)
Please call pt and schedule appt with Dr. Jimmey Ralph within the next week due to paperwork is due 5/27.

## 2021-02-20 ENCOUNTER — Telehealth: Payer: Self-pay | Admitting: *Deleted

## 2021-02-20 ENCOUNTER — Telehealth (INDEPENDENT_AMBULATORY_CARE_PROVIDER_SITE_OTHER): Payer: BC Managed Care – PPO | Admitting: Family Medicine

## 2021-02-20 VITALS — Ht 67.0 in | Wt 196.0 lb

## 2021-02-20 DIAGNOSIS — G8929 Other chronic pain: Secondary | ICD-10-CM

## 2021-02-20 DIAGNOSIS — M25571 Pain in right ankle and joints of right foot: Secondary | ICD-10-CM

## 2021-02-20 DIAGNOSIS — M79671 Pain in right foot: Secondary | ICD-10-CM | POA: Diagnosis not present

## 2021-02-20 NOTE — Assessment & Plan Note (Signed)
See above.  His FMLA paperwork was completed today.  He will continue taking his gabapentin.

## 2021-02-20 NOTE — Telephone Encounter (Signed)
FMLA form faxed to [phone] Patient aware

## 2021-02-20 NOTE — Progress Notes (Signed)
   Alexander [name] is a 60 y.o. male who presents today for a virtual office visit.  Assessment/Plan:  Chronic Problems Addressed Today: Chronic foot pain, right Overall symptoms are stable however he would like to have FMLA paperwork modified to state that he currently working 8 hours/day 7 days/week and needs 6 to 7 days off per month.  He is on gabapentin.  If my paperwork was completed today.  Chronic pain of right ankle See above.  His FMLA paperwork was completed today.  He will continue taking his gabapentin.     Subjective:  HPI:  See A/p.         Objective/Observations  Physical Exam: Gen: NAD, resting comfortably Pulm: Normal work of breathing Neuro: Grossly normal, moves all extremities Psych: Normal affect and thought content  Virtual Visit via Video   I connected with Alexander [name] on 02/20/21 at  3:40 PM EDT by a video enabled telemedicine application and verified that I am speaking with the correct person using two identifiers. The limitations of evaluation and management by telemedicine and the availability of in person appointments were discussed. The patient expressed understanding and agreed to proceed.   Patient location: Home Provider location: [place] [facility] [employer] Persons participating in the virtual visit: Myself and Patient     Alexander [name]. Jimmey Ralph, MD 02/20/2021 4:08 PM

## 2021-02-20 NOTE — Assessment & Plan Note (Signed)
Overall symptoms are stable however he would like to have FMLA paperwork modified to state that he currently working 8 hours/day 7 days/week and needs 6 to 7 days off per month.  He is on gabapentin.  If my paperwork was completed today.

## 2021-02-22 ENCOUNTER — Telehealth: Payer: Self-pay

## 2021-02-22 MED ORDER — RIVAROXABAN 20 MG PO TABS: 20.0000 mg | ORAL_TABLET | Freq: Every day | ORAL | 5 refills | Status: DC

## 2021-02-22 NOTE — Telephone Encounter (Signed)
.   LAST APPOINTMENT DATE: 02/20/2021   NEXT APPOINTMENT DATE:@Visit  date not found  MEDICATION:rivaroxaban (XARELTO) 20 MG TABS tablet   PHARMACY:[id number] - [place], [place] - [address]

## 2021-02-22 NOTE — Telephone Encounter (Signed)
Rx for Xarelto sent to pharmacy.

## 2021-03-03 ENCOUNTER — Encounter: Payer: BC Managed Care – PPO | Admitting: Vascular Surgery

## 2021-03-10 ENCOUNTER — Telehealth: Payer: Self-pay

## 2021-03-10 NOTE — Telephone Encounter (Signed)
Spoke with patient he is requesting antibiotic,informed to go to urgent care to get treatment. He has appointment scheduled 03/13/21

## 2021-03-10 NOTE — Telephone Encounter (Signed)
Nurse Assessment Nurse: Stefano Gaul, RN, Vera Date/Time Alexander [name] Time): 03/10/2021 12:25:11 PM Confirm and document reason for call. If symptomatic, describe symptoms. ---Caller states he has pulled off several deer ticks 2 days ago. has bites on his foot and ankle. No fever. Does the patient have any new or worsening symptoms? ---Yes Will a triage be completed? ---Yes Related visit to physician within the last 2 weeks? ---No Does the PT have any chronic conditions? (i.e. diabetes, asthma, this includes High risk factors for pregnancy, etc.) ---Yes List chronic conditions. ---gout; HTN; DVT Is this a behavioral health or substance abuse call? ---No Guidelines Guideline Title Affirmed Question Affirmed Notes Nurse Date/Time (Eastern Time) Tick Bite Deer tick bite with no complications Stefano Gaul, RN, Dwana Curd 03/10/2021 12:27:46 PM Disp. Time Alexander [name] Time) Disposition Final User 03/10/2021 12:31:38 PM Home Care Yes Stefano Gaul, RN, Dwana Curd PLEASE NOTE: All timestamps contained within this report are represented as [place] Standard Time. CONFIDENTIALTY NOTICE: This fax transmission is intended only for the addressee. It contains information that is legally privileged, confidential or otherwise protected from use or disclosure. If you are not the intended recipient, you are strictly prohibited from reviewing, disclosing, copying using or disseminating any of this information or taking any action in reliance on or regarding this information. If you have received this fax in error, please notify us immediately by telephone so that we can arrange for its return to [place]. Phone: [phone], Toll-Free: [phone], Fax: [phone] Page: 2 of 2 Call Id: [id number] Caller Disagree/Comply Comply Caller Understands Yes PreDisposition Call Doctor Care Advice Given Per Guideline HOME CARE: * You should be able to treat this at home. CALL BACK IF: * Bite begins to look infected * Fever or rash occur in the  next 4 weeks * You become worse CARE ADVICE given per Tick Bites (Adult) guideline. ANTIBIOTIC OINTMENT: * Apply an over-the-counter antibiotic ointment to the bite once. * Wash the bite area and your hands with soap and water after removing the tick

## 2021-03-13 ENCOUNTER — Encounter: Payer: Self-pay | Admitting: Family Medicine

## 2021-03-13 ENCOUNTER — Other Ambulatory Visit: Payer: Self-pay

## 2021-03-13 ENCOUNTER — Ambulatory Visit (INDEPENDENT_AMBULATORY_CARE_PROVIDER_SITE_OTHER): Payer: BC Managed Care – PPO | Admitting: Family Medicine

## 2021-03-13 VITALS — BP 135/84 | HR 75 | Temp 97.8°F | Ht 69.0 in | Wt 196.6 lb

## 2021-03-13 DIAGNOSIS — J0100 Acute maxillary sinusitis, unspecified: Secondary | ICD-10-CM | POA: Diagnosis not present

## 2021-03-13 DIAGNOSIS — S50862A Insect bite (nonvenomous) of left forearm, initial encounter: Secondary | ICD-10-CM | POA: Diagnosis not present

## 2021-03-13 DIAGNOSIS — W57XXXA Bitten or stung by nonvenomous insect and other nonvenomous arthropods, initial encounter: Secondary | ICD-10-CM | POA: Diagnosis not present

## 2021-03-13 MED ORDER — DOXYCYCLINE HYCLATE 100 MG PO TABS: 100.0000 mg | ORAL_TABLET | Freq: Two times a day (BID) | ORAL | 0 refills | Status: AC

## 2021-03-13 NOTE — Progress Notes (Signed)
Subjective  CC:  Chief Complaint  Patient presents with   Tick Removal    Tick Bites, Left arm and Left leg. Got bit sometime within the last week  Same day acute visit; PCP not available. New pt to me. Chart reviewed.    HPI: Alexander [name] is a 60 y.o. male who presents to the office today to address the problems listed above in the chief complaint. 60 year old male reports he has had multiple tick bites on his left forearm and ankle.  He reports he is fully remove them.  He reports he got these tick bites while sitting in his home.  He does not have the ticks with him but is certain they were tick bites.  He has 5-6 bite marks on his left forearm that look like they have been picked at.  He would like an antibiotic.  He also feels he has a sinus infection with thick nasal drainage and would like an antibiotic for that.  He denies fevers or chills.  He has no arthralgias.  He is a vague historian.  No nausea vomiting or abdominal pain.  Assessment  1. Tick bite, unspecified site, initial encounter   2. Acute non-recurrent maxillary sinusitis      Plan  Skin lesions and sinus symptoms: Atypical presentation for tick bites but he does have skin lesions that look like they have been picked at and could be getting infected.  Doxycycline to cover for both infections.  Education given.  Follow-up as needed.  Follow up: As needed Visit date not found  No orders of the defined types were placed in this encounter.  Meds ordered this encounter  Medications   doxycycline (VIBRA-TABS) 100 MG tablet    Sig: Take 1 tablet (100 mg total) by mouth 2 (two) times daily for 7 days.    Dispense:  14 tablet    Refill:  0      I reviewed the patients updated PMH, FH, and SocHx.    Patient Active Problem List   Diagnosis Date Noted   Noncompliance 01/22/2020   Impaired fasting blood sugar 01/22/2020   Chronic diarrhea 01/22/2020   High risk medication use 10/30/2019   S/P surgical  manipulation of ankle joint 10/30/2019   Hyperlipidemia 10/30/2019   Hepatic steatosis 10/08/2019   Spinal stenosis 10/08/2019   Aortic atherosclerosis (HCC) 10/08/2019   Peripheral arterial disease (HCC) 10/08/2019   Sleep disturbance 10/08/2019   Diverticulosis 10/08/2019   Pulmonary embolus (HCC) 09/29/2019   Acute pulmonary embolism (HCC) 09/28/2019   DVT (deep venous thrombosis) (HCC) 09/28/2019   Essential hypertension, benign 09/28/2019   Gout 09/28/2019   Chronic pain of right ankle 12/12/2016   Chronic foot pain, right 11/02/2012   Elevated uric acid in blood 09/21/2011   Chronic GERD 09/21/2011   Current Meds  Medication Sig   allopurinol (ZYLOPRIM) 100 MG tablet Take 1 tablet (100 mg total) by mouth daily.   amitriptyline (ELAVIL) 10 MG tablet Take 1 tablet (10 mg total) by mouth at bedtime.   doxycycline (VIBRA-TABS) 100 MG tablet Take 1 tablet (100 mg total) by mouth 2 (two) times daily for 7 days.   gabapentin (NEURONTIN) 100 MG capsule Take 1 capsule (100 mg total) by mouth 3 (three) times daily.   metoprolol tartrate (LOPRESSOR) 50 MG tablet TAKE 1 TABLET(50 MG) BY MOUTH TWICE DAILY   rivaroxaban (XARELTO) 20 MG TABS tablet Take 1 tablet (20 mg total) by mouth daily with supper.  Allergies: Patient has No Known Allergies. Family History: Patient family history includes Asthma in his sister; Cancer in his father; Diabetes in his brother, brother, maternal aunt, and paternal aunt; Gout in his mother; Heart disease in his mother; Hypertension in his brother, brother, brother, father, mother, sister, and sister; Stroke in his mother. Social History:  Patient  reports that he has quit smoking. His smoking use included cigarettes. He has never used smokeless tobacco. He reports current alcohol use of about 3.0 standard drinks of alcohol per week. He reports that he does not use drugs.  Review of Systems: Constitutional: Negative for fever malaise or  anorexia Cardiovascular: negative for chest pain Respiratory: negative for SOB or persistent cough Gastrointestinal: negative for abdominal pain  Objective  Vitals: BP 135/84   Pulse 75   Temp 97.8 F (36.6 C) (Temporal)   Ht 5\' 9"  (1.753 m)   Wt 196 lb 9.6 oz (89.2 kg)   SpO2 98%   BMI 29.03 kg/m  General: no acute distress , A&Ox3 HEENT: PEERL, conjunctiva normal, neck is supple, mild nasal congestion  Skin:  Warm, left forearm with multiple red areas with scabs and mild surrounding erythema without pus    Commons side effects, risks, benefits, and alternatives for medications and treatment plan prescribed today were discussed, and the patient expressed understanding of the given instructions. Patient is instructed to call or message via MyChart if he/she has any questions or concerns regarding our treatment plan. No barriers to understanding were identified. We discussed Red Flag symptoms and signs in detail. Patient expressed understanding regarding what to do in case of urgent or emergency type symptoms.  Medication list was reconciled, printed and provided to the patient in AVS. Patient instructions and summary information was reviewed with the patient as documented in the AVS. This note was prepared with assistance of Dragon voice recognition software. Occasional wrong-word or sound-a-like substitutions may have occurred due to the inherent limitations of voice recognition software  This visit occurred during the SARS-CoV-2 public health emergency.  Safety protocols were in place, including screening questions prior to the visit, additional usage of staff PPE, and extensive cleaning of exam room while observing appropriate contact time as indicated for disinfecting solutions.

## 2021-03-13 NOTE — Patient Instructions (Signed)
Please follow up if symptoms do not improve or as needed.   Take the antibiotic twice a day for a week.

## 2021-04-05 ENCOUNTER — Telehealth: Payer: Self-pay

## 2021-04-05 NOTE — Telephone Encounter (Signed)
Patient is calling back stating the pharmacy had a mix up with another prescription to disregard message above.

## 2021-04-05 NOTE — Telephone Encounter (Signed)
Patient is calling in stating that rivaroxaban (XARELTO) 20 MG TABS tablet   is no longer being covered by insurance is wondering what he needs to do.

## 2021-04-06 NOTE — Telephone Encounter (Signed)
Noted  

## 2021-05-24 ENCOUNTER — Telehealth: Payer: Self-pay

## 2021-05-24 NOTE — Telephone Encounter (Signed)
Please contact pt and tell him we have not received his FMLA paperwork. Have him resend.

## 2021-05-24 NOTE — Telephone Encounter (Signed)
Pt called asking if we received his FMLA forms.

## 2021-06-01 ENCOUNTER — Ambulatory Visit (INDEPENDENT_AMBULATORY_CARE_PROVIDER_SITE_OTHER): Payer: BC Managed Care – PPO | Admitting: Physician Assistant

## 2021-06-01 ENCOUNTER — Other Ambulatory Visit: Payer: Self-pay

## 2021-06-01 ENCOUNTER — Encounter: Payer: Self-pay | Admitting: Physician Assistant

## 2021-06-01 VITALS — BP 136/88 | HR 99 | Temp 97.7°F | Ht 69.0 in | Wt 202.2 lb

## 2021-06-01 DIAGNOSIS — N529 Male erectile dysfunction, unspecified: Secondary | ICD-10-CM | POA: Diagnosis not present

## 2021-06-01 DIAGNOSIS — R21 Rash and other nonspecific skin eruption: Secondary | ICD-10-CM | POA: Diagnosis not present

## 2021-06-01 DIAGNOSIS — G8929 Other chronic pain: Secondary | ICD-10-CM

## 2021-06-01 DIAGNOSIS — M79671 Pain in right foot: Secondary | ICD-10-CM

## 2021-06-01 MED ORDER — TADALAFIL 20 MG PO TABS: 10.0000 mg | ORAL_TABLET | ORAL | 11 refills | Status: DC | PRN

## 2021-06-01 MED ORDER — DOXYCYCLINE HYCLATE 100 MG PO TABS
100.0000 mg | ORAL_TABLET | Freq: Two times a day (BID) | ORAL | 0 refills | Status: DC
Start: 2021-06-01 — End: 2021-10-30
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
## 2021-06-01 NOTE — Patient Instructions (Signed)
It was great to see you!  We will complete and fax FMLA forms today  We will refer to vein doctor today  Cialis prescription printed -- please use [facility] to find where this may be cheaper for you  Start oral antibiotic for your rash, if no improvement let me know  If a referral was placed today, you will be contacted for an appointment. Please note that routine referrals can sometimes take up to 3-4 weeks to process. Please call our office if you haven't heard anything after this time frame.  Take care,  Jarold Motto PA-C

## 2021-06-09 NOTE — Telephone Encounter (Signed)
FMLA paper work was completed and faxed to [phone]

## 2021-06-09 NOTE — Telephone Encounter (Signed)
LVM to make patient aware paperwork has been faxed

## 2021-06-09 NOTE — Telephone Encounter (Signed)
Patient states the forms have been sent, Alexander [name] stated she filled them out last week. He agreed and said company received  forms and reviewed them and had been sent back to out office due to use missing a section needing to be filled out. Our office has not received them yet. But company is re faxing paperwork

## 2021-09-01 ENCOUNTER — Other Ambulatory Visit: Payer: Self-pay | Admitting: Physician Assistant

## 2021-09-01 ENCOUNTER — Telehealth: Payer: Self-pay

## 2021-09-01 DIAGNOSIS — Z86718 Personal history of other venous thrombosis and embolism: Secondary | ICD-10-CM

## 2021-09-01 DIAGNOSIS — I10 Essential (primary) hypertension: Secondary | ICD-10-CM

## 2021-09-01 NOTE — Telephone Encounter (Signed)
MEDICATION: ELIQUIS 5 MG TABS tablet (patient states he still takes this but didn't see on his medication list)  amitriptyline (ELAVIL) 10 MG tablet  PHARMACY:  [id number] - [place], [place] - [address] AT [facility] Phone:  [phone]  Fax:  [phone]      Comments: Patient states he is down to one pill.  **Let patient know to contact pharmacy at the end of the day to make sure medication is ready. **  ** Please notify patient to allow 48-72 hours to process**  **Encourage patient to contact the pharmacy for refills or they can request refills through [facility]**

## 2021-09-01 NOTE — Telephone Encounter (Signed)
Medication has already been filled

## 2021-09-08 ENCOUNTER — Telehealth: Payer: Self-pay

## 2021-09-08 ENCOUNTER — Other Ambulatory Visit: Payer: Self-pay | Admitting: *Deleted

## 2021-09-08 NOTE — Telephone Encounter (Signed)
Please call patient -- he has both eliquis AND xarelto on his current medication list. What is he taking? Please clarify what patient needs at this time.

## 2021-09-08 NOTE — Telephone Encounter (Signed)
  Encourage patient to contact the pharmacy for refills or they can request refills through [facility]  LAST APPOINTMENT DATE:  06/01/21  NEXT APPOINTMENT DATE:  MEDICATION: ELIQUIS 5 MG TABS tablet (patient states he still takes this but didn't see on his medication list)  Is the patient out of medication? yes  PHARMACY:[id number] - [place], [place] - [address] AT [facility]  Let patient know to contact pharmacy at the end of the day to make sure medication is ready.  Please notify patient to allow 48-72 hours to process

## 2021-09-08 NOTE — Telephone Encounter (Signed)
Message has been sent to PCP for approval.

## 2021-09-11 NOTE — Telephone Encounter (Signed)
Left message on voicemail to call office.  

## 2021-09-12 NOTE — Telephone Encounter (Signed)
Left message on voicemail to call office. Need to know if pt is taking Eliquis or Xarelto?

## 2021-09-15 ENCOUNTER — Other Ambulatory Visit: Payer: Self-pay | Admitting: Physician Assistant

## 2021-10-26 ENCOUNTER — Ambulatory Visit: Payer: BC Managed Care – PPO | Admitting: Physician Assistant

## 2021-10-30 ENCOUNTER — Other Ambulatory Visit: Payer: Self-pay

## 2021-10-30 ENCOUNTER — Encounter: Payer: Self-pay | Admitting: Physician Assistant

## 2021-10-30 ENCOUNTER — Ambulatory Visit (INDEPENDENT_AMBULATORY_CARE_PROVIDER_SITE_OTHER): Payer: BC Managed Care – PPO | Admitting: Physician Assistant

## 2021-10-30 VITALS — BP 124/66 | HR 81 | Temp 98.0°F | Ht 69.0 in | Wt 197.0 lb

## 2021-10-30 DIAGNOSIS — Z13220 Encounter for screening for lipoid disorders: Secondary | ICD-10-CM | POA: Diagnosis not present

## 2021-10-30 DIAGNOSIS — M79671 Pain in right foot: Secondary | ICD-10-CM

## 2021-10-30 DIAGNOSIS — G8929 Other chronic pain: Secondary | ICD-10-CM | POA: Diagnosis not present

## 2021-10-30 DIAGNOSIS — R972 Elevated prostate specific antigen [PSA]: Secondary | ICD-10-CM

## 2021-10-30 DIAGNOSIS — M1A9XX0 Chronic gout, unspecified, without tophus (tophi): Secondary | ICD-10-CM

## 2021-10-30 DIAGNOSIS — Z136 Encounter for screening for cardiovascular disorders: Secondary | ICD-10-CM

## 2021-10-30 LAB — CBC WITH DIFFERENTIAL/PLATELET
Basophils Absolute: 0 10*3/uL (ref 0.0–0.1)
Basophils Relative: 0.8 % (ref 0.0–3.0)
Eosinophils Absolute: 0.4 10*3/uL (ref 0.0–0.7)
Eosinophils Relative: 10.4 % — ABNORMAL HIGH (ref 0.0–5.0)
HCT: 42.6 % (ref 39.0–52.0)
Hemoglobin: 14.1 g/dL (ref 13.0–17.0)
Lymphocytes Relative: 36.5 % (ref 12.0–46.0)
Lymphs Abs: 1.3 10*3/uL (ref 0.7–4.0)
MCHC: 33.1 g/dL (ref 30.0–36.0)
MCV: 99.2 fl (ref 78.0–100.0)
Monocytes Absolute: 0.6 10*3/uL (ref 0.1–1.0)
Monocytes Relative: 16.4 % — ABNORMAL HIGH (ref 3.0–12.0)
Neutro Abs: 1.2 10*3/uL — ABNORMAL LOW (ref 1.4–7.7)
Neutrophils Relative %: 35.9 % — ABNORMAL LOW (ref 43.0–77.0)
Platelets: 226 10*3/uL (ref 150.0–400.0)
RBC: 4.29 Mil/uL (ref 4.22–5.81)
RDW: 13.8 % (ref 11.5–15.5)
WBC: 3.4 10*3/uL — ABNORMAL LOW (ref 4.0–10.5)
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
## 2021-10-30 NOTE — Patient Instructions (Signed)
It was great to see you!  Take 100 mg gabapentin three times daily  Follow-up with me in 1 month  We will update your blood work today  Take care,  Jarold Motto PA-C

## 2021-10-31 ENCOUNTER — Other Ambulatory Visit: Payer: Self-pay | Admitting: Physician Assistant

## 2021-10-31 DIAGNOSIS — R972 Elevated prostate specific antigen [PSA]: Secondary | ICD-10-CM

## 2021-10-31 LAB — COMPREHENSIVE METABOLIC PANEL
ALT: 10 U/L (ref 0–53)
AST: 14 U/L (ref 0–37)
Albumin: 3.7 g/dL (ref 3.5–5.2)
Alkaline Phosphatase: 66 U/L (ref 39–117)
BUN: 15 mg/dL (ref 6–23)
CO2: 27 mEq/L (ref 19–32)
Calcium: 8.9 mg/dL (ref 8.4–10.5)
Chloride: 107 mEq/L (ref 96–112)
Creatinine, Ser: 0.92 mg/dL (ref 0.40–1.50)
GFR: 90.97 mL/min (ref >60.00)
Glucose, Bld: 90 mg/dL (ref 70–99)
Potassium: 4.1 mEq/L (ref 3.5–5.1)
Sodium: 140 mEq/L (ref 135–145)
Total Bilirubin: 0.3 mg/dL (ref 0.2–1.2)
Total Protein: 6.6 g/dL (ref 6.0–8.3)

## 2021-10-31 LAB — PSA: PSA: 11.6 ng/mL — ABNORMAL HIGH (ref 0.10–4.00)

## 2021-10-31 LAB — LIPID PANEL
Cholesterol: 119 mg/dL (ref 0–200)
HDL: 42.8 mg/dL (ref >39.00)
LDL Cholesterol: 64 mg/dL (ref 0–99)
NonHDL: 75.78
Total CHOL/HDL Ratio: 3
Triglycerides: 60 mg/dL (ref 0.0–149.0)
VLDL: 12 mg/dL (ref 0.0–40.0)

## 2021-10-31 LAB — URIC ACID: Uric Acid, Serum: 7.4 mg/dL (ref 4.0–7.8)

## 2021-11-09 ENCOUNTER — Telehealth: Payer: Self-pay

## 2021-11-09 NOTE — Telephone Encounter (Signed)
Spoke to pt told him we did not receive disability papers. Pt verbalized understanding and will have them fax again.

## 2021-11-09 NOTE — Telephone Encounter (Signed)
Patient is calling to check the status of short term disability forms.    States they were faxed last Thursday.

## 2021-11-10 NOTE — Telephone Encounter (Signed)
Type of form received:FMLA   Form should be Faxed toRejeana Brock [phone]  Form placed:  in [facility] folder   [place] charge sheet.   Individual made aware of 3-5 business day turn around (Y/N)?

## 2021-11-13 NOTE — Telephone Encounter (Signed)
Received forms, pt needs follow up appointment before end of month.

## 2021-11-13 NOTE — Telephone Encounter (Signed)
Left message on voicemail to call office.  

## 2021-11-14 NOTE — Telephone Encounter (Signed)
Pt called back, told him I have completed our part but you did not finish filling out the top part and signature. Pt said it is fine he has another form where he filled out his part and signed. Told pt need to schedule f/u appt by the end of the month to go back to work. Pt said he is going back to work in 2 months, March 30. Told him okay I will put start date as 10/30/2021 and end date 12/28/2021. Pt verbalized understanding and will call back to schedule follow up. Told him will fax form today.

## 2021-11-14 NOTE — Telephone Encounter (Signed)
Short Term Disability form completed and faxed to [phone].

## 2021-11-29 ENCOUNTER — Ambulatory Visit: Payer: BC Managed Care – PPO | Admitting: Physician Assistant

## 2021-12-04 ENCOUNTER — Telehealth: Payer: Self-pay | Admitting: *Deleted

## 2021-12-04 NOTE — Telephone Encounter (Signed)
Tried to contact pt says call can not be completed as dialed. I will try again later. ?

## 2021-12-04 NOTE — Telephone Encounter (Signed)
Received fax from [facility] asking for clearance letter for prostate biopsy and pt is taking Eliquis asking for pt to stop 3 days prior to procedure.  ?Discussed with Dr. Elisha Headland, okay to send letter cleared for procedure and okay to stop Eliquis as requested.  ?After reviewing chart looks like pt is taking Xarelto 20 mg daily. Need to confirm with pt what he is taking. ? ?

## 2021-12-04 NOTE — Telephone Encounter (Signed)
Tried pt again could not reach, still says call can not be completed as dialed. ?

## 2021-12-05 NOTE — Telephone Encounter (Signed)
Tried to contact pt again still unable to get through. Called pt's son Lenon Curt, told him I have been trying to contact your father but the number is not working. Darius said he has been trying to get in touch with him too. Told him if he hears from him or get a hold of him to please call Norval Gable office at [employer]. Darius verbalized understanding and will give him the message. ?

## 2021-12-06 NOTE — Telephone Encounter (Signed)
Tried to contact pt again, phone still not working. ?

## 2021-12-13 ENCOUNTER — Ambulatory Visit: Payer: BC Managed Care – PPO | Admitting: Physician Assistant
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
## 2021-12-20 ENCOUNTER — Ambulatory Visit: Payer: BC Managed Care – PPO | Admitting: Physician Assistant

## 2022-03-26 ENCOUNTER — Emergency Department (HOSPITAL_BASED_OUTPATIENT_CLINIC_OR_DEPARTMENT_OTHER): Payer: BC Managed Care – PPO

## 2022-03-26 ENCOUNTER — Encounter (HOSPITAL_COMMUNITY): Payer: Self-pay

## 2022-03-26 ENCOUNTER — Other Ambulatory Visit: Payer: Self-pay

## 2022-03-26 ENCOUNTER — Emergency Department (HOSPITAL_COMMUNITY)
Admission: EM | Admit: 2022-03-26 | Discharge: 2022-03-26 | Disposition: A | Discharge status: Home / Self Care | Payer: BC Managed Care – PPO | Attending: Emergency Medicine | Admitting: Emergency Medicine

## 2022-03-26 DIAGNOSIS — L03115 Cellulitis of right lower limb: Secondary | ICD-10-CM | POA: Insufficient documentation

## 2022-03-26 DIAGNOSIS — R52 Pain, unspecified: Secondary | ICD-10-CM

## 2022-03-26 DIAGNOSIS — I10 Essential (primary) hypertension: Secondary | ICD-10-CM | POA: Insufficient documentation

## 2022-03-26 DIAGNOSIS — W57XXXA Bitten or stung by nonvenomous insect and other nonvenomous arthropods, initial encounter: Secondary | ICD-10-CM | POA: Insufficient documentation

## 2022-03-26 DIAGNOSIS — Z79899 Other long term (current) drug therapy: Secondary | ICD-10-CM | POA: Insufficient documentation

## 2022-03-26 DIAGNOSIS — M7989 Other specified soft tissue disorders: Secondary | ICD-10-CM

## 2022-03-26 LAB — CBC WITH DIFFERENTIAL/PLATELET
Abs Immature Granulocytes: 0.01 10*3/uL (ref 0.00–0.07)
Basophils Absolute: 0 10*3/uL (ref 0.0–0.1)
Basophils Relative: 1 %
Eosinophils Absolute: 0.2 10*3/uL (ref 0.0–0.5)
Eosinophils Relative: 3 %
HCT: 45.9 % (ref 39.0–52.0)
Hemoglobin: 15.6 g/dL (ref 13.0–17.0)
Immature Granulocytes: 0 %
Lymphocytes Relative: 17 %
Lymphs Abs: 1.1 10*3/uL (ref 0.7–4.0)
MCH: 33.6 pg (ref 26.0–34.0)
MCHC: 34 g/dL (ref 30.0–36.0)
MCV: 98.9 fL (ref 80.0–100.0)
Monocytes Absolute: 0.4 10*3/uL (ref 0.1–1.0)
Monocytes Relative: 7 %
Neutro Abs: 4.7 10*3/uL (ref 1.7–7.7)
Neutrophils Relative %: 72 %
Platelets: 285 10*3/uL (ref 150–400)
RBC: 4.64 MIL/uL (ref 4.22–5.81)
RDW: 13.2 % (ref 11.5–15.5)
WBC: 6.4 10*3/uL (ref 4.0–10.5)
nRBC: 0 % (ref 0.0–0.2)

## 2022-03-26 LAB — COMPREHENSIVE METABOLIC PANEL
ALT: 15 U/L (ref 0–44)
AST: 22 U/L (ref 15–41)
Albumin: 4 g/dL (ref 3.5–5.0)
Alkaline Phosphatase: 64 U/L (ref 38–126)
Anion gap: 11 (ref 5–15)
BUN: 15 mg/dL (ref 6–20)
CO2: 21 mmol/L — ABNORMAL LOW (ref 22–32)
Calcium: 9.8 mg/dL (ref 8.9–10.3)
Chloride: 109 mmol/L (ref 98–111)
Creatinine, Ser: 1.02 mg/dL (ref 0.61–1.24)
GFR, Estimated: >60 mL/min (ref >60)
Glucose, Bld: 184 mg/dL — ABNORMAL HIGH (ref 70–99)
Potassium: 3.7 mmol/L (ref 3.5–5.1)
Sodium: 141 mmol/L (ref 135–145)
Total Bilirubin: 0.7 mg/dL (ref 0.3–1.2)
Total Protein: 7.8 g/dL (ref 6.5–8.1)

## 2022-03-26 MED ORDER — CEPHALEXIN 500 MG PO CAPS
500.0000 mg | ORAL_CAPSULE | Freq: Four times a day (QID) | ORAL | 0 refills | Status: DC
Start: 2022-03-26 — End: 2022-05-14

## 2022-03-26 MED ORDER — BACITRACIN ZINC 500 UNIT/GM EX OINT
TOPICAL_OINTMENT | Freq: Two times a day (BID) | CUTANEOUS | Status: DC
  Filled 2022-03-26: qty 0.9

## 2022-03-26 MED ORDER — CEPHALEXIN 500 MG PO CAPS
500.0000 mg | ORAL_CAPSULE | Freq: Once | ORAL | Status: AC
  Administered 2022-03-26: 500 mg via ORAL
  Filled 2022-03-26: qty 1

## 2022-03-26 MED ORDER — BACITRACIN ZINC 500 UNIT/GM EX OINT: 1.0000 | TOPICAL_OINTMENT | Freq: Two times a day (BID) | CUTANEOUS | 0 refills | Status: DC
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
## 2022-05-14 ENCOUNTER — Other Ambulatory Visit: Payer: Self-pay

## 2022-05-14 ENCOUNTER — Emergency Department (HOSPITAL_COMMUNITY)
Admission: EM | Admit: 2022-05-14 | Discharge: 2022-05-14 | Disposition: A | Discharge status: Home / Self Care | Payer: Self-pay | Attending: Emergency Medicine | Admitting: Emergency Medicine

## 2022-05-14 ENCOUNTER — Telehealth (HOSPITAL_COMMUNITY): Payer: Self-pay | Admitting: Emergency Medicine

## 2022-05-14 ENCOUNTER — Encounter (HOSPITAL_COMMUNITY): Payer: Self-pay | Admitting: Emergency Medicine

## 2022-05-14 ENCOUNTER — Emergency Department (HOSPITAL_COMMUNITY): Payer: Self-pay

## 2022-05-14 DIAGNOSIS — M542 Cervicalgia: Secondary | ICD-10-CM | POA: Diagnosis not present

## 2022-05-14 DIAGNOSIS — M47816 Spondylosis without myelopathy or radiculopathy, lumbar region: Secondary | ICD-10-CM | POA: Insufficient documentation

## 2022-05-14 DIAGNOSIS — R21 Rash and other nonspecific skin eruption: Secondary | ICD-10-CM | POA: Diagnosis not present

## 2022-05-14 DIAGNOSIS — S3992XA Unspecified injury of lower back, initial encounter: Secondary | ICD-10-CM | POA: Insufficient documentation

## 2022-05-14 MED ORDER — CEPHALEXIN 500 MG PO CAPS: 500.0000 mg | ORAL_CAPSULE | Freq: Four times a day (QID) | ORAL | 0 refills | Status: DC

## 2022-05-14 MED ORDER — CEPHALEXIN 500 MG PO CAPS
500.0000 mg | ORAL_CAPSULE | Freq: Once | ORAL | Status: AC
  Administered 2022-05-14: 500 mg via ORAL
  Filled 2022-05-14: qty 1

## 2022-05-14 MED ORDER — MUPIROCIN CALCIUM 2 % EX CREA: TOPICAL_CREAM | CUTANEOUS | 0 refills | Status: DC

## 2022-05-14 MED ORDER — MUPIROCIN CALCIUM 2 % EX CREA: 1.0000 | TOPICAL_CREAM | Freq: Two times a day (BID) | CUTANEOUS | 0 refills | Status: DC
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
## 2023-04-16 ENCOUNTER — Emergency Department (HOSPITAL_BASED_OUTPATIENT_CLINIC_OR_DEPARTMENT_OTHER)
Admit: 2023-04-16 | Discharge: 2023-04-16 | Disposition: A | Discharge status: Home / Self Care | Payer: 59 | Attending: Emergency Medicine | Admitting: Emergency Medicine

## 2023-04-16 ENCOUNTER — Other Ambulatory Visit: Payer: Self-pay

## 2023-04-16 ENCOUNTER — Emergency Department (HOSPITAL_COMMUNITY)
Admission: EM | Admit: 2023-04-16 | Discharge: 2023-04-16 | Disposition: A | Discharge status: Home / Self Care | Payer: 59 | Attending: Emergency Medicine | Admitting: Emergency Medicine

## 2023-04-16 DIAGNOSIS — M79605 Pain in left leg: Secondary | ICD-10-CM | POA: Insufficient documentation

## 2023-04-16 DIAGNOSIS — M7989 Other specified soft tissue disorders: Secondary | ICD-10-CM

## 2023-04-16 DIAGNOSIS — M25562 Pain in left knee: Secondary | ICD-10-CM | POA: Insufficient documentation

## 2023-04-16 DIAGNOSIS — I10 Essential (primary) hypertension: Secondary | ICD-10-CM | POA: Diagnosis not present

## 2023-04-16 DIAGNOSIS — Z79899 Other long term (current) drug therapy: Secondary | ICD-10-CM | POA: Insufficient documentation

## 2023-04-16 DIAGNOSIS — Z7901 Long term (current) use of anticoagulants: Secondary | ICD-10-CM | POA: Diagnosis not present

## 2023-04-16 MED ORDER — NAPROXEN 500 MG PO TABS: 500.0000 mg | ORAL_TABLET | Freq: Two times a day (BID) | ORAL | 0 refills | Status: AC | PRN

## 2023-04-16 MED ORDER — GABAPENTIN 100 MG PO CAPS: 100.0000 mg | ORAL_CAPSULE | Freq: Three times a day (TID) | ORAL | 0 refills | Status: DC | PRN

## 2023-04-16 MED ORDER — METHYLPREDNISOLONE 4 MG PO TBPK
ORAL_TABLET | ORAL | 0 refills | Status: DC
Start: 2023-04-16 — End: 2023-07-23

## 2023-04-16 NOTE — Progress Notes (Signed)
Left lower extremity venous duplex has been completed. Preliminary results can be found in CV Proc through chart review.  Results were given to Dr. Renaye Rakers.  04/16/23 4:37 PM Olen Cordial RVT

## 2023-04-16 NOTE — ED Provider Notes (Signed)
EMERGENCY DEPARTMENT AT [facility] Provider Note   CSN: [id number] Arrival date & time: 04/16/23  1218     History  Chief Complaint  Patient presents with   Knee Pain    Alexander [name] is a 62 y.o. male presented ED with complaint of left lower extremity pain.  Patient reports been ongoing for several weeks or perhaps months, does not know the exact onset.  He says he specifically has pain with bearing weight on his left leg.  After he is standing up or trying to move around, he will have a sharp pain along the posterior aspect of the left upper leg, from the hip down to the knee.  He denies back pain.  He denies radiation into his toes.  He says he has been using a cane for extra balance because of this.  He reports he is concerned because of the history of "blood clots in my legs" many years ago and he was formerly on anticoagulation but "I took myself off of it".  Denies chest pain  HPI     Home Medications Prior to Admission medications   Medication Sig Start Date End Date Taking? Authorizing Provider  gabapentin (NEURONTIN) 100 MG capsule Take 1 capsule (100 mg total) by mouth 3 (three) times daily as needed. 04/16/23 05/16/23 Yes Fantasy Donald, Kermit Balo, MD  methylPREDNISolone (MEDROL DOSEPAK) 4 MG TBPK tablet Use as directed 04/16/23  Yes Rahcel Shutes, Kermit Balo, MD  naproxen (NAPROSYN) 500 MG tablet Take 1 tablet (500 mg total) by mouth 2 (two) times daily as needed for mild pain or moderate pain. Take with food 04/16/23 05/16/23 Yes Colt Martelle, Kermit Balo, MD  allopurinol (ZYLOPRIM) 100 MG tablet TAKE 1 TABLET(100 MG) BY MOUTH DAILY 09/15/21   Jarold Motto, PA  bacitracin ointment Apply 1 Application topically 2 (two) times daily. 03/26/22   Peter Garter, PA  cephALEXin (KEFLEX) 500 MG capsule Take 1 capsule (500 mg total) by mouth 4 (four) times daily. 05/14/22   Mancel Bale, MD  cephALEXin (KEFLEX) 500 MG capsule Take 1 capsule (500 mg total) by mouth 4 (four) times  daily. 05/14/22   Mancel Bale, MD  gabapentin (NEURONTIN) 100 MG capsule Take 1 capsule (100 mg total) by mouth 3 (three) times daily. 12/07/20   Jarold Motto, PA  metoprolol tartrate (LOPRESSOR) 50 MG tablet TAKE 1 TABLET(50 MG) BY MOUTH TWICE DAILY 09/01/21   Jarold Motto, PA  mupirocin cream Idelle Jo) 2 % Twice a day to rash on legs 05/14/22   Mancel Bale, MD  mupirocin cream (BACTROBAN) 2 % Apply 1 Application topically 2 (two) times daily. Apply to rash on legs 05/14/22   Mancel Bale, MD  rivaroxaban (XARELTO) 20 MG TABS tablet Take 1 tablet (20 mg total) by mouth daily with supper. 02/22/21   Ardith Dark, MD  tadalafil (CIALIS) 20 MG tablet Take 0.5-1 tablets (10-20 mg total) by mouth every other day as needed for erectile dysfunction. 06/01/21   Jarold Motto, PA      Allergies    Patient has no known allergies.    Review of Systems   Review of Systems  Physical Exam Updated Vital Signs BP (!) 149/88 (BP Location: Right Arm)   Pulse 72   Temp 98.4 F (36.9 C) (Oral)   Resp 18   SpO2 98%  Physical Exam Constitutional:      General: He is not in acute distress. HENT:     Head: Normocephalic and atraumatic.  Eyes:  Conjunctiva/sclera: Conjunctivae normal.     Pupils: Pupils are equal, round, and reactive to light.  Cardiovascular:     Rate and Rhythm: Normal rate and regular rhythm.  Pulmonary:     Effort: Pulmonary effort is normal. No respiratory distress.  Abdominal:     General: There is no distension.     Tenderness: There is no abdominal tenderness.  Musculoskeletal:     Comments: Full range of motion bilateral lower extremities, no focal tenderness or effusion noted around the knee joint or the left hip joint, patient is able to actively and passively extend the lower extremity.  There is some mild tracking tenderness along the posterior left leg without visible swelling  Skin:    General: Skin is warm and dry.  Neurological:     General: No  focal deficit present.     Mental Status: He is alert and oriented to person, place, and time. Mental status is at baseline.  Psychiatric:        Mood and Affect: Mood normal.        Behavior: Behavior normal.     ED Results / Procedures / Treatments   Labs (all labs ordered are listed, but only abnormal results are displayed) Labs Reviewed - No data to display  EKG None  Radiology VAS [place] LOWER EXTREMITY VENOUS (DVT) (ONLY [facility] & [facility])  Result Date: 04/16/2023  Lower Venous DVT Study Patient Name:  Alexander [name]  Date of Exam:   04/16/2023 Medical Rec #: [id number]      Accession #:    [id number] Date of Birth: [birth year]      Patient Gender: M Patient Age:   60 years Exam Location:  [facility] Procedure:      VAS [place] LOWER EXTREMITY VENOUS (DVT) Referring Phys: Mackenzie Groom --------------------------------------------------------------------------------  Indications: Swelling.  Risk Factors: None identified. Limitations: Poor ultrasound/tissue interface and patient positioning. Comparison Study: Remote history of DVT. Performing Technologist: Chanda Busing RVT  Examination Guidelines: A complete evaluation includes B-mode imaging, spectral Doppler, color Doppler, and power Doppler as needed of all accessible portions of each vessel. Bilateral testing is considered an integral part of a complete examination. Limited examinations for reoccurring indications may be performed as noted. The reflux portion of the exam is performed with the patient in reverse Trendelenburg.  +-----+---------------+---------+-----------+----------+--------------+ RIGHTCompressibilityPhasicitySpontaneityPropertiesThrombus Aging +-----+---------------+---------+-----------+----------+--------------+ CFV  Full           Yes      Yes                                 +-----+---------------+---------+-----------+----------+--------------+    +---------+---------------+---------+-----------+----------+--------------+ LEFT     CompressibilityPhasicitySpontaneityPropertiesThrombus Aging +---------+---------------+---------+-----------+----------+--------------+ CFV      Full           Yes      Yes                                 +---------+---------------+---------+-----------+----------+--------------+ SFJ      Full                                                        +---------+---------------+---------+-----------+----------+--------------+ FV Prox  Full                                                        +---------+---------------+---------+-----------+----------+--------------+  FV Mid   Full                                                        +---------+---------------+---------+-----------+----------+--------------+ FV DistalFull                                                        +---------+---------------+---------+-----------+----------+--------------+ PFV      Full                                                        +---------+---------------+---------+-----------+----------+--------------+ POP      Full           Yes      Yes                                 +---------+---------------+---------+-----------+----------+--------------+ PTV      Full                                                        +---------+---------------+---------+-----------+----------+--------------+ PERO     Full                                                        +---------+---------------+---------+-----------+----------+--------------+    Summary: RIGHT: - No evidence of common femoral vein obstruction.  LEFT: - There is no evidence of deep vein thrombosis in the lower extremity.  - No cystic structure found in the popliteal fossa.  *See table(s) above for measurements and observations.    Preliminary     Procedures Procedures    Medications Ordered in ED Medications - No  data to display  ED Course/ Medical Decision Making/ A&P                             Medical Decision Making Risk Prescription drug management.   Ddx includes radiculopathy versus symptomatic osteoarthritis versus DVT versus other.  I have low suspicion for infection.  No negation for emergent x-ray imaging, as he has no reported falls or injuries to suspect a fracture, and this has been ongoing for weeks or months.  We will obtain a vascular ultrasound for DVT evaluation.  If this is negative I would recommend an orthopedic follow-up as an outpatient, as I suspect this may be joint related pain most likely.  He does not have a PCP.  Vascular ultrasound reviewed and negative.        Final Clinical Impression(s) / ED Diagnoses Final diagnoses:  Hypertension, unspecified type  Left leg pain    Rx / DC  Orders ED Discharge Orders          Ordered    gabapentin (NEURONTIN) 100 MG capsule  3 times daily PRN        04/16/23 1644    methylPREDNISolone (MEDROL DOSEPAK) 4 MG TBPK tablet        04/16/23 1644    naproxen (NAPROSYN) 500 MG tablet  2 times daily PRN        04/16/23 1644              [employer], Kermit Balo, MD 04/16/23 1647

## 2023-04-16 NOTE — ED Triage Notes (Signed)
Pt reports left knee pain that radiates up to left hip x 1 week

## 2023-04-16 NOTE — ED Notes (Signed)
Pt ambulatory to bathroom in triage w/o assist. Utilizes personal cane.

## 2023-05-13 ENCOUNTER — Telehealth: Payer: Self-pay | Admitting: Physician Assistant

## 2023-05-13 NOTE — Telephone Encounter (Signed)
Called patient no answer left VM to call office back to schedule next available cpe.

## 2023-07-10 ENCOUNTER — Emergency Department (HOSPITAL_COMMUNITY): Payer: 59

## 2023-07-10 ENCOUNTER — Encounter (HOSPITAL_COMMUNITY): Payer: Self-pay

## 2023-07-10 ENCOUNTER — Inpatient Hospital Stay (HOSPITAL_COMMUNITY)
Admission: EM | Admit: 2023-07-10 | Discharge: 2023-07-23 | DRG: 175 | Disposition: A | Discharge status: Home / Self Care | Payer: 59 | Attending: Internal Medicine | Admitting: Internal Medicine

## 2023-07-10 ENCOUNTER — Other Ambulatory Visit: Payer: Self-pay

## 2023-07-10 DIAGNOSIS — I110 Hypertensive heart disease with heart failure: Secondary | ICD-10-CM | POA: Diagnosis not present

## 2023-07-10 DIAGNOSIS — I749 Embolism and thrombosis of unspecified artery: Secondary | ICD-10-CM | POA: Insufficient documentation

## 2023-07-10 DIAGNOSIS — Z87891 Personal history of nicotine dependence: Secondary | ICD-10-CM | POA: Diagnosis not present

## 2023-07-10 DIAGNOSIS — I739 Peripheral vascular disease, unspecified: Secondary | ICD-10-CM | POA: Diagnosis not present

## 2023-07-10 DIAGNOSIS — M79661 Pain in right lower leg: Secondary | ICD-10-CM | POA: Diagnosis not present

## 2023-07-10 DIAGNOSIS — Z833 Family history of diabetes mellitus: Secondary | ICD-10-CM

## 2023-07-10 DIAGNOSIS — M109 Gout, unspecified: Secondary | ICD-10-CM | POA: Diagnosis not present

## 2023-07-10 DIAGNOSIS — R7989 Other specified abnormal findings of blood chemistry: Secondary | ICD-10-CM | POA: Diagnosis not present

## 2023-07-10 DIAGNOSIS — E8809 Other disorders of plasma-protein metabolism, not elsewhere classified: Secondary | ICD-10-CM | POA: Diagnosis present

## 2023-07-10 DIAGNOSIS — Z825 Family history of asthma and other chronic lower respiratory diseases: Secondary | ICD-10-CM

## 2023-07-10 DIAGNOSIS — I50810 Right heart failure, unspecified: Secondary | ICD-10-CM | POA: Diagnosis present

## 2023-07-10 DIAGNOSIS — I272 Pulmonary hypertension, unspecified: Secondary | ICD-10-CM | POA: Diagnosis not present

## 2023-07-10 DIAGNOSIS — Z1152 Encounter for screening for COVID-19: Secondary | ICD-10-CM | POA: Diagnosis not present

## 2023-07-10 DIAGNOSIS — Z7901 Long term (current) use of anticoagulants: Secondary | ICD-10-CM | POA: Diagnosis not present

## 2023-07-10 DIAGNOSIS — I2609 Other pulmonary embolism with acute cor pulmonale: Secondary | ICD-10-CM | POA: Diagnosis not present

## 2023-07-10 DIAGNOSIS — Z79899 Other long term (current) drug therapy: Secondary | ICD-10-CM

## 2023-07-10 DIAGNOSIS — R918 Other nonspecific abnormal finding of lung field: Secondary | ICD-10-CM | POA: Diagnosis not present

## 2023-07-10 DIAGNOSIS — R040 Epistaxis: Secondary | ICD-10-CM | POA: Diagnosis present

## 2023-07-10 DIAGNOSIS — D696 Thrombocytopenia, unspecified: Secondary | ICD-10-CM | POA: Insufficient documentation

## 2023-07-10 DIAGNOSIS — I82431 Acute embolism and thrombosis of right popliteal vein: Secondary | ICD-10-CM | POA: Diagnosis present

## 2023-07-10 DIAGNOSIS — J9601 Acute respiratory failure with hypoxia: Secondary | ICD-10-CM | POA: Diagnosis present

## 2023-07-10 DIAGNOSIS — E669 Obesity, unspecified: Secondary | ICD-10-CM | POA: Diagnosis not present

## 2023-07-10 DIAGNOSIS — K219 Gastro-esophageal reflux disease without esophagitis: Secondary | ICD-10-CM | POA: Diagnosis present

## 2023-07-10 DIAGNOSIS — Z8249 Family history of ischemic heart disease and other diseases of the circulatory system: Secondary | ICD-10-CM | POA: Diagnosis not present

## 2023-07-10 DIAGNOSIS — D649 Anemia, unspecified: Secondary | ICD-10-CM | POA: Diagnosis not present

## 2023-07-10 DIAGNOSIS — I2781 Cor pulmonale (chronic): Secondary | ICD-10-CM | POA: Diagnosis not present

## 2023-07-10 DIAGNOSIS — I5032 Chronic diastolic (congestive) heart failure: Secondary | ICD-10-CM | POA: Diagnosis not present

## 2023-07-10 DIAGNOSIS — I824Y9 Acute embolism and thrombosis of unspecified deep veins of unspecified proximal lower extremity: Principal | ICD-10-CM

## 2023-07-10 DIAGNOSIS — I82409 Acute embolism and thrombosis of unspecified deep veins of unspecified lower extremity: Secondary | ICD-10-CM | POA: Diagnosis present

## 2023-07-10 DIAGNOSIS — I2699 Other pulmonary embolism without acute cor pulmonale: Secondary | ICD-10-CM | POA: Diagnosis not present

## 2023-07-10 DIAGNOSIS — Z86711 Personal history of pulmonary embolism: Secondary | ICD-10-CM

## 2023-07-10 DIAGNOSIS — R0600 Dyspnea, unspecified: Secondary | ICD-10-CM | POA: Diagnosis not present

## 2023-07-10 DIAGNOSIS — I82451 Acute embolism and thrombosis of right peroneal vein: Secondary | ICD-10-CM

## 2023-07-10 DIAGNOSIS — I10 Essential (primary) hypertension: Secondary | ICD-10-CM | POA: Diagnosis not present

## 2023-07-10 DIAGNOSIS — Z6828 Body mass index (BMI) 28.0-28.9, adult: Secondary | ICD-10-CM

## 2023-07-10 DIAGNOSIS — Z8489 Family history of other specified conditions: Secondary | ICD-10-CM

## 2023-07-10 DIAGNOSIS — I2721 Secondary pulmonary arterial hypertension: Secondary | ICD-10-CM | POA: Diagnosis not present

## 2023-07-10 DIAGNOSIS — I2510 Atherosclerotic heart disease of native coronary artery without angina pectoris: Secondary | ICD-10-CM | POA: Diagnosis not present

## 2023-07-10 DIAGNOSIS — Z86718 Personal history of other venous thrombosis and embolism: Secondary | ICD-10-CM | POA: Diagnosis not present

## 2023-07-10 DIAGNOSIS — R0989 Other specified symptoms and signs involving the circulatory and respiratory systems: Secondary | ICD-10-CM | POA: Diagnosis not present

## 2023-07-10 DIAGNOSIS — Z823 Family history of stroke: Secondary | ICD-10-CM

## 2023-07-10 DIAGNOSIS — S2241XA Multiple fractures of ribs, right side, initial encounter for closed fracture: Secondary | ICD-10-CM | POA: Diagnosis not present

## 2023-07-10 DIAGNOSIS — G4700 Insomnia, unspecified: Secondary | ICD-10-CM | POA: Diagnosis not present

## 2023-07-10 DIAGNOSIS — R0602 Shortness of breath: Secondary | ICD-10-CM | POA: Diagnosis not present

## 2023-07-10 LAB — CBC WITH DIFFERENTIAL/PLATELET
Abs Immature Granulocytes: 0.02 10*3/uL (ref 0.00–0.07)
Basophils Absolute: 0 10*3/uL (ref 0.0–0.1)
Basophils Relative: 0 %
Eosinophils Absolute: 0.7 10*3/uL — ABNORMAL HIGH (ref 0.0–0.5)
Eosinophils Relative: 8 %
HCT: 40.6 % (ref 39.0–52.0)
Hemoglobin: 12.8 g/dL — ABNORMAL LOW (ref 13.0–17.0)
Immature Granulocytes: 0 %
Lymphocytes Relative: 18 %
Lymphs Abs: 1.5 10*3/uL (ref 0.7–4.0)
MCH: 28.5 pg (ref 26.0–34.0)
MCHC: 31.5 g/dL (ref 30.0–36.0)
MCV: 90.4 fL (ref 80.0–100.0)
Monocytes Absolute: 0.9 10*3/uL (ref 0.1–1.0)
Monocytes Relative: 11 %
Neutro Abs: 5.4 10*3/uL (ref 1.7–7.7)
Neutrophils Relative %: 63 %
Platelets: 205 10*3/uL (ref 150–400)
RBC: 4.49 MIL/uL (ref 4.22–5.81)
RDW: 18.7 % — ABNORMAL HIGH (ref 11.5–15.5)
WBC: 8.6 10*3/uL (ref 4.0–10.5)
nRBC: 0 % (ref 0.0–0.2)

## 2023-07-10 LAB — BASIC METABOLIC PANEL
Anion gap: 9 (ref 5–15)
BUN: 13 mg/dL (ref 8–23)
CO2: 21 mmol/L — ABNORMAL LOW (ref 22–32)
Calcium: 8.7 mg/dL — ABNORMAL LOW (ref 8.9–10.3)
Chloride: 106 mmol/L (ref 98–111)
Creatinine, Ser: 0.76 mg/dL (ref 0.61–1.24)
GFR, Estimated: >60 mL/min (ref >60)
Glucose, Bld: 106 mg/dL — ABNORMAL HIGH (ref 70–99)
Potassium: 3.9 mmol/L (ref 3.5–5.1)
Sodium: 136 mmol/L (ref 135–145)

## 2023-07-10 LAB — RESP PANEL BY RT-PCR (RSV, FLU A&B, COVID)  RVPGX2
Influenza A by PCR: NEGATIVE
Influenza B by PCR: NEGATIVE
Resp Syncytial Virus by PCR: NEGATIVE
SARS Coronavirus 2 by RT PCR: NEGATIVE

## 2023-07-10 LAB — BRAIN NATRIURETIC PEPTIDE: B Natriuretic Peptide: 334.8 pg/mL — ABNORMAL HIGH (ref 0.0–100.0)

## 2023-07-10 LAB — TROPONIN I (HIGH SENSITIVITY)
Troponin I (High Sensitivity): 36 ng/L — ABNORMAL HIGH (ref <18)
Troponin I (High Sensitivity): 42 ng/L — ABNORMAL HIGH (ref <18)

## 2023-07-10 MED ORDER — MELATONIN 3 MG PO TABS: 3.0000 mg | ORAL_TABLET | Freq: Every day | ORAL | Status: DC

## 2023-07-10 MED ORDER — OXYCODONE-ACETAMINOPHEN 5-325 MG PO TABS
1.0000 | ORAL_TABLET | ORAL | Status: DC | PRN
  Administered 2023-07-10 – 2023-07-18 (×17): 2 via ORAL
  Administered 2023-07-19 – 2023-07-20 (×2): 1 via ORAL
  Filled 2023-07-10 (×9): qty 2
  Filled 2023-07-10: qty 1
  Filled 2023-07-10: qty 2
  Filled 2023-07-10: qty 1
  Filled 2023-07-10 (×10): qty 2

## 2023-07-10 MED ORDER — HEPARIN (PORCINE) 25000 UT/250ML-% IV SOLN
1600.0000 [IU]/h | INTRAVENOUS | Status: DC
  Administered 2023-07-10: 1400 [IU]/h via INTRAVENOUS
  Filled 2023-07-10 (×2): qty 250

## 2023-07-10 MED ORDER — MELATONIN 3 MG PO TABS
3.0000 mg | ORAL_TABLET | Freq: Every evening | ORAL | Status: DC | PRN
  Administered 2023-07-11 – 2023-07-15 (×5): 3 mg via ORAL
  Filled 2023-07-10 (×6): qty 1

## 2023-07-10 MED ORDER — ACETAMINOPHEN 325 MG PO TABS
650.0000 mg | ORAL_TABLET | Freq: Four times a day (QID) | ORAL | Status: DC | PRN
  Administered 2023-07-14 – 2023-07-22 (×13): 650 mg via ORAL
  Filled 2023-07-10 (×13): qty 2

## 2023-07-10 MED ORDER — ONDANSETRON HCL 4 MG PO TABS
4.0000 mg | ORAL_TABLET | Freq: Four times a day (QID) | ORAL | Status: DC | PRN
  Administered 2023-07-14: 4 mg via ORAL
  Filled 2023-07-10: qty 1

## 2023-07-10 MED ORDER — ACETAMINOPHEN 650 MG RE SUPP: 650.0000 mg | Freq: Four times a day (QID) | RECTAL | Status: DC | PRN

## 2023-07-10 MED ORDER — SODIUM CHLORIDE (PF) 0.9 % IJ SOLN
INTRAMUSCULAR | Status: AC
  Filled 2023-07-10: qty 50

## 2023-07-10 MED ORDER — ONDANSETRON HCL 4 MG/2ML IJ SOLN: 4.0000 mg | Freq: Four times a day (QID) | INTRAMUSCULAR | Status: DC | PRN

## 2023-07-10 MED ORDER — IOHEXOL 350 MG/ML SOLN
75.0000 mL | Freq: Once | INTRAVENOUS | Status: AC | PRN
  Administered 2023-07-10: 75 mL via INTRAVENOUS

## 2023-07-10 MED ORDER — HEPARIN BOLUS VIA INFUSION
2400.0000 [IU] | Freq: Once | INTRAVENOUS | Status: AC
  Administered 2023-07-10: 2400 [IU] via INTRAVENOUS
  Filled 2023-07-10: qty 2400

## 2023-07-10 MED ORDER — CHLORHEXIDINE GLUCONATE CLOTH 2 % EX PADS
6.0000 | MEDICATED_PAD | Freq: Every day | CUTANEOUS | Status: DC
  Administered 2023-07-14 – 2023-07-17 (×2): 6 via TOPICAL

## 2023-07-10 NOTE — Progress Notes (Signed)
Lower extremity venous duplex completed. Please see CV Procedures for preliminary results.  Initial findings reported to Faythe Dingwall, Charity fundraiser.  Shona Simpson, RVT 07/10/23 6:49 PM

## 2023-07-10 NOTE — ED Triage Notes (Signed)
C/o sob that started about 2 days ago. C/o right sided chest pain, denies pain radiating. Pt states he feels sob at rest and exertion. Unlabored, symmetrical breathing in triage. C/o left leg pain from hip down to knee.

## 2023-07-10 NOTE — Assessment & Plan Note (Signed)
Hold home BP meds in pt with acute PE with cor pulmonale / RHS.
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
## 2023-07-10 NOTE — ED Provider Notes (Signed)
[place] EMERGENCY DEPARTMENT AT [facility] Provider Note   CSN: [id number] Arrival date & time: 07/10/23  1135     History  Chief Complaint  Patient presents with   Shortness of Breath    Alexander [name] is a 62 y.o. male.   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  He states that he feels short of breath at rest and with exertion.  He also endorses some right-sided leg pain without significant swelling, no fevers or chills.  Denies any chest pressure, denies any significant radiation.  Denies any abdominal pain, nausea or vomiting.  Home Medications Prior to Admission medications   Medication Sig Start Date End Date Taking? Authorizing Provider  gabapentin (NEURONTIN) 100 MG capsule Take 1 capsule (100 mg total) by mouth 3 (three) times daily as needed. Patient taking differently: Take 100 mg by mouth daily. 04/16/23 07/10/23 Yes Trifan, Kermit Balo, MD  naproxen (NAPROSYN) 500 MG tablet Take 500 mg by mouth 2 (two) times daily as needed (for pain- take with food).   Yes [provider]  allopurinol (ZYLOPRIM) 100 MG tablet TAKE 1 TABLET(100 MG) BY MOUTH DAILY Patient not taking: Reported on 07/10/2023 09/15/21   Jarold Motto, PA  bacitracin ointment Apply 1 Application topically 2 (two) times daily. Patient not taking: Reported on 07/10/2023 03/26/22   Sherian Maroon A, PA  cephALEXin (KEFLEX) 500 MG capsule Take 1 capsule (500 mg total) by mouth 4 (four) times daily. Patient not taking: Reported on 07/10/2023 05/14/22   Mancel Bale, MD  cephALEXin (KEFLEX) 500 MG capsule Take 1 capsule (500 mg total) by mouth 4 (four) times daily. Patient not taking: Reported on 07/10/2023  05/14/22   Mancel Bale, MD  gabapentin (NEURONTIN) 100 MG capsule Take 1 capsule (100 mg total) by mouth 3 (three) times daily. Patient not taking: Reported on 07/10/2023 12/07/20   Jarold Motto, PA  methylPREDNISolone (MEDROL DOSEPAK) 4 MG TBPK tablet Use as directed Patient not taking: Reported on 07/10/2023 04/16/23   Terald Sleeper, MD  metoprolol tartrate (LOPRESSOR) 50 MG tablet TAKE 1 TABLET(50 MG) BY MOUTH TWICE DAILY Patient not taking: Reported on 07/10/2023 09/01/21   Jarold Motto, PA  mupirocin cream (BACTROBAN) 2 % Twice a day to rash on legs Patient not taking: Reported on 07/10/2023 05/14/22   Mancel Bale, MD  mupirocin cream (BACTROBAN) 2 % Apply 1 Application topically 2 (two) times daily. Apply to rash on legs Patient not taking: Reported on 07/10/2023 05/14/22   Mancel Bale, MD  rivaroxaban (XARELTO) 20 MG TABS tablet Take 1 tablet (20 mg total) by mouth daily with supper. Patient not taking: Reported on 07/10/2023 02/22/21   Ardith Dark, MD  tadalafil (CIALIS) 20 MG tablet Take 0.5-1 tablets (10-20 mg total) by mouth every other day as needed for erectile dysfunction. Patient not taking: Reported on 07/10/2023 06/01/21   Jarold Motto, PA      Allergies    Patient has no known allergies.    Review of Systems   Review of Systems  Respiratory:  Positive for shortness of breath.   All other systems reviewed and are negative.   Physical Exam Updated Vital Signs BP 119/80   Pulse (!) 114   Temp (!) 97.2 F (36.2  C) (Oral)   Resp (!) 24   Ht 5\' 9"  (1.753 m)   Wt 86.2 kg   SpO2 100%   BMI 28.06 kg/m  Physical Exam Vitals and nursing note reviewed.  Constitutional:      General: He is not in acute distress.    Appearance: He is well-developed.  HENT:     Head: Normocephalic and atraumatic.  Eyes:     Conjunctiva/sclera: Conjunctivae normal.  Cardiovascular:     Rate and Rhythm: Regular rhythm. Tachycardia present.  Pulmonary:     Effort:  Pulmonary effort is normal. No respiratory distress.     Breath sounds: Normal breath sounds.  Abdominal:     Palpations: Abdomen is soft.     Tenderness: There is no abdominal tenderness.     Comments: No right upper quadrant tenderness  Musculoskeletal:        General: No swelling.     Cervical back: Neck supple.  Skin:    General: Skin is warm and dry.     Capillary Refill: Capillary refill takes less than 2 seconds.  Neurological:     Mental Status: He is alert.  Psychiatric:        Mood and Affect: Mood normal.     ED Results / Procedures / Treatments   Labs (all labs ordered are listed, but only abnormal results are displayed) Labs Reviewed  CBC WITH DIFFERENTIAL/PLATELET - Abnormal; Notable for the following components:      Result Value   Hemoglobin 12.8 (*)    RDW 18.7 (*)    Eosinophils Absolute 0.7 (*)    All other components within normal limits  BASIC METABOLIC PANEL - Abnormal; Notable for the following components:   CO2 21 (*)    Glucose, Bld 106 (*)    Calcium 8.7 (*)    All other components within normal limits  BRAIN NATRIURETIC PEPTIDE - Abnormal; Notable for the following components:   B Natriuretic Peptide 334.8 (*)    All other components within normal limits  TROPONIN I (HIGH SENSITIVITY) - Abnormal; Notable for the following components:   Troponin I (High Sensitivity) 36 (*)    All other components within normal limits  TROPONIN I (HIGH SENSITIVITY) - Abnormal; Notable for the following components:   Troponin I (High Sensitivity) 42 (*)    All other components within normal limits  RESP PANEL BY RT-PCR (RSV, FLU A&B, COVID)  RVPGX2  CBC  HEPARIN LEVEL (UNFRACTIONATED)    EKG EKG Interpretation Date/Time:  Wednesday July 10 2023 16:37:42 EDT Ventricular Rate:  120 PR Interval:  134 QRS Duration:  99 QT Interval:  363 QTC Calculation: 513 R Axis:   66  Text Interpretation: Sinus tachycardia Nonspecific T abnormalities, anterior leads  Prolonged QT interval Confirmed by Ernie Avena ([id number]) on 07/10/2023 5:00:44 PM  Radiology DG Chest Portable 1 View  Result Date: 07/10/2023 CLINICAL DATA:  Dyspnea EXAM: PORTABLE CHEST 1 VIEW COMPARISON:  None Available. FINDINGS: Lungs are clear. No pneumothorax or pleural effusion. Cardiac size within normal limits. Pulmonary vascularity is normal. Healed right rib fractures. No acute bone abnormality. IMPRESSION: 1. No active disease. Electronically Signed   By: Helyn Numbers M.D.   On: 07/10/2023 19:35   CT Angio Chest PE W and/or Wo Contrast  Result Date: 07/10/2023 CLINICAL DATA:  High probability pulmonary embolism, dyspnea EXAM: CT ANGIOGRAPHY CHEST WITH CONTRAST TECHNIQUE: Multidetector CT imaging of the chest was performed using the standard protocol during bolus administration of intravenous contrast.  Multiplanar CT image reconstructions and MIPs were obtained to evaluate the vascular anatomy. RADIATION DOSE REDUCTION: This exam was performed according to the departmental dose-optimization program which includes automated exposure control, adjustment of the mA and/or kV according to patient size and/or use of iterative reconstruction technique. CONTRAST:  75mL OMNIPAQUE IOHEXOL 350 MG/ML SOLN COMPARISON:  None Available. FINDINGS: Cardiovascular: There is adequate opacification the pulmonary arterial tree. There is extensive branching intraluminal filling defect within the lobar and segmental pulmonary arteries of the lungs bilaterally in keeping with acute pulmonary embolism. The embolic burden is large. The central pulmonary arteries are enlarged. There is inversion of the normal RV/LV ratio (RV/LV = 1.65) in keeping with changes of right heart strain. Minimal coronary artery calcification. Global cardiac size within normal limits. No pericardial effusion. The thoracic aorta is unremarkable. Mediastinum/Nodes: No enlarged mediastinal, hilar, or axillary lymph nodes. Thyroid gland, trachea,  and esophagus demonstrate no significant findings. Lungs/Pleura: Patchy asymmetric infiltrate within the posterior basal left lower lobe may represent atelectasis or developing infarct. Mild asymmetric right apical scarring. Lungs are otherwise clear. No pneumothorax or pleural effusion. No central obstructing lesion. Upper Abdomen: No acute abnormality. Musculoskeletal: Multiple healed right rib fractures noted. No acute bone abnormality. No lytic or blastic bone lesion. Review of the MIP images confirms the above findings. IMPRESSION: 1. Acute pulmonary embolism with large embolic burden and CT evidence of right heart strain (RV/LV Ratio = 1.65) consistent with at least submassive (intermediate risk) PE. The presence of right heart strain has been associated with an increased risk of morbidity and mortality. Please refer to the "Code PE Focused" order set in EPIC. These results were called by telephone at the time of interpretation on 07/10/2023 at 7:34 pm to provider [facility] , who verbally acknowledged these results. Electronically Signed   By: Helyn Numbers M.D.   On: 07/10/2023 19:34   VAS [place] LOWER EXTREMITY VENOUS (DVT) (ONLY [facility] & [facility])  Result Date: 07/10/2023  Lower Venous DVT Study Patient Name:  BAILEE THALL  Date of Exam:   07/10/2023 Medical Rec #: [id number]      Accession #:    [id number] Date of Birth: 15-Jul-1961      Patient Gender: M Patient Age:   28 years Exam Location:  [facility] Procedure:      VAS [place] LOWER EXTREMITY VENOUS (DVT) Referring Phys: Ernie Avena --------------------------------------------------------------------------------  Indications: Pain.  Risk Factors: None identified. Anticoagulation: Xarelto. Comparison Study: Novel PVD seen since previous exam Performing Technologist: Shona Simpson  Examination Guidelines: A complete evaluation includes B-mode imaging, spectral Doppler, color Doppler, and power Doppler as needed of all accessible portions of each vessel.  Bilateral testing is considered an integral part of a complete examination. Limited examinations for reoccurring indications may be performed as noted. The reflux portion of the exam is performed with the patient in reverse Trendelenburg.  +---------+---------------+---------+-----------+----------+-------------------+ RIGHT    CompressibilityPhasicitySpontaneityPropertiesThrombus Aging      +---------+---------------+---------+-----------+----------+-------------------+ CFV      Full           Yes      Yes                                      +---------+---------------+---------+-----------+----------+-------------------+ SFJ      Full                                                             +---------+---------------+---------+-----------+----------+-------------------+  FV Prox  Full                                                             +---------+---------------+---------+-----------+----------+-------------------+ FV Mid   Full                                                             +---------+---------------+---------+-----------+----------+-------------------+ FV DistalFull                                                             +---------+---------------+---------+-----------+----------+-------------------+ PFV      Full                                                             +---------+---------------+---------+-----------+----------+-------------------+ POP      Partial        Yes      Yes                  Age Indeterminate   +---------+---------------+---------+-----------+----------+-------------------+ PTV      Full                                                             +---------+---------------+---------+-----------+----------+-------------------+ PERO                                                  Not well visualized +---------+---------------+---------+-----------+----------+-------------------+    +---------+---------------+---------+-----------+----------+--------------+ LEFT     CompressibilityPhasicitySpontaneityPropertiesThrombus Aging +---------+---------------+---------+-----------+----------+--------------+ CFV      Full           Yes      Yes                                 +---------+---------------+---------+-----------+----------+--------------+ SFJ      Full                                                        +---------+---------------+---------+-----------+----------+--------------+ FV Prox  Full                                                        +---------+---------------+---------+-----------+----------+--------------+  FV Mid   Full                                                        +---------+---------------+---------+-----------+----------+--------------+ FV DistalFull                                                        +---------+---------------+---------+-----------+----------+--------------+ PFV      Full                                                        +---------+---------------+---------+-----------+----------+--------------+ POP      Full           Yes      Yes                                 +---------+---------------+---------+-----------+----------+--------------+ PTV      Full                                                        +---------+---------------+---------+-----------+----------+--------------+ PERO     Full                                                        +---------+---------------+---------+-----------+----------+--------------+     Summary: RIGHT: - Findings consistent with age indeterminate deep vein thrombosis involving the right popliteal vein.  - No cystic structure found in the popliteal fossa. - Peroneal and posterior tibial veins obstructed by calcified posterior tibial artery - Ultrasound characteristics of enlarged lymph nodes are noted in the groin.  LEFT: - There  is no evidence of deep vein thrombosis in the lower extremity.  - No cystic structure found in the popliteal fossa. - Peroneal and posterior tibial veins obstructed by calcified posterior tibial artery - Ultrasound characteristics of enlarged lymph nodes noted in the groin.  *See table(s) above for measurements and observations.    Preliminary     Procedures .Critical Care  Performed by: Ernie Avena, MD Authorized by: Ernie Avena, MD   Critical care provider statement:    Critical care time (minutes):  65   Critical care was necessary to treat or prevent imminent or life-threatening deterioration of the following conditions:  Respiratory failure   Critical care was time spent personally by me on the following activities:  Development of treatment plan with patient or surrogate, discussions with consultants, evaluation of patient's response to treatment, examination of patient, ordering and review of laboratory studies, ordering and review of radiographic studies, ordering and performing treatments and interventions, pulse oximetry, re-evaluation of patient's condition and review of old charts   Care  discussed with: admitting provider       Medications Ordered in ED Medications  heparin ADULT infusion 100 units/mL (25000 units/257mL) (1,400 Units/hr Intravenous New Bag/Given 07/10/23 1956)  iohexol (OMNIPAQUE) 350 MG/ML injection 75 mL (75 mLs Intravenous Contrast Given 07/10/23 1900)  heparin bolus via infusion 2,400 Units (2,400 Units Intravenous Bolus from Bag 07/10/23 1956)    ED Course/ Medical Decision Making/ A&P                                 Medical Decision Making Amount and/or Complexity of Data Reviewed Labs: ordered. Radiology: ordered.  Risk Prescription drug management. Decision regarding hospitalization.    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  He states that he feels short of breath at rest and with exertion.  He also endorses some right-sided leg pain without significant swelling, no fevers or chills.  Denies any chest pressure, denies any significant radiation.  Denies any abdominal pain, nausea or vomiting.   On arrival, the patient was afebrile, tachycardic heart rate 115, not tachypneic RR 18, BP 120/78, saturating 100% on room air.  Sinus tachycardia noted on cardiac telemetry.  Concern for PE in the setting of the patient self discontinuation of his home anticoagulation.  He also endorses some pain in his right leg, considered DVT as well.  DVT ultrasound ordered in addition to CTA PE study in addition to screening labs.  EKG revealed sinus tachycardia, ventricular rate 115, no acute ischemic changes, prolonged QTc noted to 511.  DVT ultrasound was performed and I was informed that the patient had a partially occlusive thrombus in the right popliteal vein.  The patient was started empirically on heparin due to concern for PE as well. RIGHT:    - Findings consistent with age indeterminate deep vein thrombosis involving the right popliteal vein.         - No cystic structure found in the popliteal fossa.  - Peroneal and posterior tibial veins obstructed by calcified posterior tibial artery  - Ultrasound characteristics of enlarged lymph nodes are noted in the groin.     LEFT:          - There is no evidence of deep vein thrombosis in the lower extremity.     - No cystic structure found in the popliteal fossa.  - Peroneal and posterior tibial veins obstructed by calcified posterior tibial artery  - Ultrasound characteristics of enlarged lymph nodes noted in the groin.    Laboratory evaluation revealed evidence of an elevated cardiac troponin to 36, BNP elevated to 335, CBC without  a leukocytosis, mild anemia to 12.8, COVID-19 influenza PCR testing was negative, BMP was without significant electrolyte abnormality, normal renal function.  CXR: IMPRESSION:  1. No active disease.    CTA PE: I was informed by radiology that the patient was found to have acute pulmonary embolism extending into the right main pulmonary artery with extensive clot burden and evidence of right heart strain. IMPRESSION:  1. Acute pulmonary embolism with large embolic burden and CT  evidence of right heart strain (RV/LV Ratio = 1.65) consistent with  at least submassive (intermediate risk) PE. The presence of right  heart strain has been associated with an increased risk  of morbidity  and mortality. Please refer to the "Code PE Focused" order set in  EPIC.    These results were called by telephone at the time of interpretation  on 07/10/2023 at 7:34 pm to provider [facility] , who verbally  acknowledged these results.    Pulmonary critical care was consulted and the patient was continued on heparin.  He remained hemodynamically stable, tachycardic to the 120s but otherwise blood pressure 126/80.  I spoke with Dr. Barnie Alderman of pulmonary critical care who reviewed the patient's CT imaging.  Patient is hemodynamically stable still at this time.  No indication for acute intervention other than IV heparin, stable for admission to progressive under the hospitalist care.  Hospitalist medicine consulted for admission and the patient was updated regarding the plan of care.   Final Clinical Impression(s) / ED Diagnoses Final diagnoses:  Acute deep vein thrombosis (DVT) of proximal vein of lower extremity, unspecified laterality (HCC)  Acute pulmonary embolism with acute cor pulmonale, unspecified pulmonary embolism type [facility])    Rx / DC Orders ED Discharge Orders     None         Ernie Avena, MD 07/10/23 2022

## 2023-07-10 NOTE — Assessment & Plan Note (Addendum)
Large clot burden with RHS on CT, tachycardia, trop leak, and a new S1QT3 on todays EKG not previously present. Looks like he's he's not been taking any home meds including Xarelto (not an AC failure). EDP d/w PCCM: they didn't want catheter directed lysis at this time Admitting to SDU due to high risk of decompensation. 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

## 2023-07-10 NOTE — Assessment & Plan Note (Signed)
R popliteal DVT on [place] in ED today.

## 2023-07-11 ENCOUNTER — Other Ambulatory Visit (HOSPITAL_COMMUNITY): Payer: Self-pay

## 2023-07-11 ENCOUNTER — Encounter (HOSPITAL_COMMUNITY)
Admission: EM | Disposition: A | Discharge status: Home / Self Care | Payer: Self-pay | Source: Home / Self Care | Attending: Internal Medicine

## 2023-07-11 ENCOUNTER — Inpatient Hospital Stay (HOSPITAL_COMMUNITY): Payer: 59

## 2023-07-11 DIAGNOSIS — I2609 Other pulmonary embolism with acute cor pulmonale: Principal | ICD-10-CM

## 2023-07-11 DIAGNOSIS — I824Y9 Acute embolism and thrombosis of unspecified deep veins of unspecified proximal lower extremity: Secondary | ICD-10-CM

## 2023-07-11 DIAGNOSIS — I10 Essential (primary) hypertension: Secondary | ICD-10-CM

## 2023-07-11 LAB — CBC
HCT: 35.6 % — ABNORMAL LOW (ref 39.0–52.0)
Hemoglobin: 11.3 g/dL — ABNORMAL LOW (ref 13.0–17.0)
MCH: 29.1 pg (ref 26.0–34.0)
MCHC: 31.7 g/dL (ref 30.0–36.0)
MCV: 91.8 fL (ref 80.0–100.0)
Platelets: 182 10*3/uL (ref 150–400)
RBC: 3.88 MIL/uL — ABNORMAL LOW (ref 4.22–5.81)
RDW: 18.6 % — ABNORMAL HIGH (ref 11.5–15.5)
WBC: 8.2 10*3/uL (ref 4.0–10.5)
nRBC: 0 % (ref 0.0–0.2)

## 2023-07-11 LAB — BASIC METABOLIC PANEL
Anion gap: 8 (ref 5–15)
BUN: 14 mg/dL (ref 8–23)
CO2: 23 mmol/L (ref 22–32)
Calcium: 8.5 mg/dL — ABNORMAL LOW (ref 8.9–10.3)
Chloride: 105 mmol/L (ref 98–111)
Creatinine, Ser: 0.92 mg/dL (ref 0.61–1.24)
GFR, Estimated: >60 mL/min (ref >60)
Glucose, Bld: 147 mg/dL — ABNORMAL HIGH (ref 70–99)
Potassium: 3.6 mmol/L (ref 3.5–5.1)
Sodium: 136 mmol/L (ref 135–145)

## 2023-07-11 LAB — HEPARIN LEVEL (UNFRACTIONATED)
Heparin Unfractionated: 0.27 [IU]/mL — ABNORMAL LOW (ref 0.30–0.70)
Heparin Unfractionated: 0.36 [IU]/mL (ref 0.30–0.70)
Heparin Unfractionated: 0.31 [IU]/mL (ref 0.30–0.70)

## 2023-07-11 LAB — ECHOCARDIOGRAM COMPLETE
AR max vel: 2.4 cm2
AV Peak grad: 3.6 mm[Hg]
Ao pk vel: 0.95 m/s
Area-P 1/2: 3.63 cm2
Height: 69 in
S' Lateral: 3.4 cm
Weight: 3040 [oz_av]

## 2023-07-11 LAB — MRSA NEXT GEN BY PCR, NASAL: MRSA by PCR Next Gen: DETECTED — AB

## 2023-07-11 LAB — HIV ANTIBODY (ROUTINE TESTING W REFLEX): HIV Screen 4th Generation wRfx: NONREACTIVE

## 2023-07-11 SURGERY — THORACENTESIS
Anesthesia: LOCAL

## 2023-07-11 MED ORDER — HEPARIN (PORCINE) 25000 UT/250ML-% IV SOLN
1700.0000 [IU]/h | INTRAVENOUS | Status: DC
  Administered 2023-07-12: 1700 [IU]/h via INTRAVENOUS
  Filled 2023-07-11: qty 250

## 2023-07-11 MED ORDER — PERFLUTREN LIPID MICROSPHERE
1.0000 mL | INTRAVENOUS | Status: AC | PRN
  Administered 2023-07-11: 4 mL via INTRAVENOUS

## 2023-07-11 MED ORDER — HEPARIN BOLUS VIA INFUSION
1300.0000 [IU] | Freq: Once | INTRAVENOUS | Status: AC
  Administered 2023-07-11: 1300 [IU] via INTRAVENOUS
  Filled 2023-07-11: qty 1300

## 2023-07-11 NOTE — Hospital Course (Addendum)
The patient is a 62 year old AAM with a PMH significant for but not limited to hx of DVT, PE, not on anticoagulation (self discontinued) presenting with chest pain.  Found to have large clot burden on CTA chest was sign suggestive of right heart failure.  EDP consulted with pulmonary medicine who recommended against lytics.  Admitted to stepdown unit for IV heparin and further evaluation. Due to ongoing chest pain, tachycardia and hypoxia he was  Seen by pulmonology, after discussion with interventional radiology, was treated with catheter directed thrombolytics with dramatic improvement.  Postprocedure the patient has done well and has now been weaned off oxygen.  Unable to afford DOAC's.  Therefore started on warfarin with enoxaparin bridge.  Since he cannot afford Lovenox , he will need to remain in hospital until INR therapeutic.  His INR is almost therapeutic in the anticipate discharging in the a.m. likely as I expect his INR to be therapeutic.  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 -Large clot burden on CTA with RHS on CT.  -Echo low normal LVEF 50-55%, no WMA, grade I DD. RV systolic function mildly reduced.  Severe pulmonary hypertension. -Seen by Pulmonology and Interventional Radiology.  Catheter directed lytics initiated 10/11.  Will need lifelong anticoagulation: warfarin based on cost.   -Will need Outpatient hypercoagulability workup.  Suggest outpatient sleep study. -Repeat echo in 3 months to see if there is any improvement  -Follow-up with PCCM as an outpatient -Hypoxia resolved -Continue enoxaparin bridge,  warfarin per pharmacy for now but enoxaparin injections may be too prohibitive expensive so we will need to remain inpatient until INR therapeutic; Last PT-INR was 21.9-1.9 Respectively  -Pharmacy is now giving a dose of Coumadin 15 mg x1 today  -Patient has an appointment with the Anticoagulation Clinic on 10/23   DVT (deep venous thrombosis) (HCC) -VTE Schan showing R popliteal DVT indeterminate and no DVT on Left.  History of clot in right leg. -C/w Warfarin with Enoxaparin bridge as above as above  Normocytic Anemia -Hgb/Hct relatively Stable now -Hgb/Hct Trend: Recent Labs  Lab 07/14/23 0331 07/15/23 0438 07/16/23 0506 07/17/23 0505 07/18/23 0450 07/19/23 0448 07/22/23 0441  HGB 9.9* 10.0* 9.9* 9.9* 10.2* 10.1* 10.4*  HCT 31.3* 31.6* 31.6* 31.1* 32.1* 31.8* 33.2*  MCV 92.1 92.9 93.2 91.5 92.2 92.7 91.2  -Checked Anemia Panel and showed and Iron Level of 25, UIBC was 303, TIBC was 328, Saturation Ratios of 8%, Ferritin of 25, Folate of 7.5, and Vitamin B12 of 222 -Continue to Monitor for S/Sx of Bleeding; No overt bleeding noted -Repeat CBC in the AM  Thrombocytopenia, resolved -Platelet Count Trend: Recent Labs  Lab 07/14/23 0331 07/15/23 0438 07/16/23 0506 07/17/23 0505 07/18/23 0450 07/19/23 0448 07/22/23 0441  PLT 137* 163 212 226 276 324 485*  -Continue to Monitor for S/Sx of Bleeding; no overt bleeding noted -Repeat CBC in the AM  Hypoalbuminemia -Patient's Albumin Trend: Recent Labs  Lab 07/17/23 0505 07/19/23 0448 07/22/23 0441  ALBUMIN 2.8* 3.0* 3.0*  -Continue to Monitor and Trend and repeat CMP in the AM  Insomnia -C/w Trazodone 50 mg po at bedtime prn Sleep and also add Diphenhydramine 25 mg po at  bedtime prn Sleep if Trazodone is ineffective -Also add Melatonin tab 3 mg po qHS

## 2023-07-11 NOTE — Progress Notes (Signed)
PHARMACY - ANTICOAGULATION CONSULT NOTE  Pharmacy Consult for heparin Indication:  VTE treatment  No Known Allergies  Patient Measurements: Height: 5\' 9"  (175.3 cm) Weight: 86.2 kg (190 lb) IBW/kg (Calculated) : 70.7 Heparin Dosing Weight: 86.2 kg  Vital Signs: Temp: 97.9 F (36.6 C) (10/10 0800) Temp Source: Oral (10/10 0800) BP: 110/87 (10/10 0800) Pulse Rate: 89 (10/10 0800)  Labs: Recent Labs    07/10/23 1749 07/10/23 1855 07/11/23 0314 07/11/23 1045  HGB 12.8*  --  11.3*  --   HCT 40.6  --  35.6*  --   PLT 205  --  182  --   HEPARINUNFRC  --   --  0.27* 0.36  CREATININE 0.76  --  0.92  --   TROPONINIHS 36* 42*  --   --     Estimated Creatinine Clearance: 91.7 mL/min (by C-G formula based on SCr of 0.92 mg/dL).    Assessment: 62 year old male presented with chest pain and shortness of breath, as well as left leg pain from hip down to knee. 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 Per notes, it looks like patient stopped taking this medication himself. Last notes from primary care from January 2023.  CTA chest positive acute PE with large embolic burden and evidence of right heart strain consistent with at least submassive PE. Lower extremity ultrasound consistent with age indeterminate DVT involving the right popliteal vein.  07/11/23 Heparin level now therapeutic on 1550 units/hr, although on lower end of range CBC: Hgb mildly low on admission and slightly decreased this AM (consistent with hemodilution but unable to confirm w/o strict I/O); Plt stable WNL RN reports no interruption in therapy and no line issues No bleeding complications noted by RN  Goal of Therapy:  Heparin level 0.3-0.7 units/ml Monitor platelets by anticoagulation protocol: Yes   Plan:  Increase heparin slightly to 1600 units/hr (given clot burden and RHS, would favor keeping heparin level above 0.4 units/ml) No bolus needed at this time Recheck confirmatory level in  6 hr Daily CBC and heparin level while on heparin infusion F/u plans for long term anticoagulation: 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

## 2023-07-11 NOTE — Progress Notes (Signed)
   07/11/23 1411  TOC Brief Assessment  Insurance and Status Reviewed  Patient has primary care physician Yes  Home environment has been reviewed home with spouse  Prior level of function: independent  Prior/Current Home Services No current home services  Social Determinants of Health Reivew SDOH reviewed no interventions necessary  Readmission risk has been reviewed Yes  Transition of care needs no transition of care needs at this time

## 2023-07-11 NOTE — TOC Benefit Eligibility Note (Signed)
Patient Product/process development scientist completed.    The patient is insured through [employer]. Patient has [employer], may use a copay card, and/or apply for patient assistance if available.    Ran test claim for Eliquis 5 mg and the current 30 day co-pay is $567.26 due to a $6200 deductible.  Ran test claim for Xarelto 20 mg and the current 30 day co-pay is $543.58 due to a $6200 deductible.   This test claim was processed through [facility]- copay amounts may vary at other pharmacies due to pharmacy/plan contracts, or as the patient moves through the different stages of their insurance plan.     Roland Earl, CPHT Pharmacy Technician III Certified Patient Advocate [facility] Patient Advocate Team Direct Number: [phone]  Fax: [phone]
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
## 2023-07-11 NOTE — Plan of Care (Signed)

## 2023-07-11 NOTE — Progress Notes (Signed)
*   Echocardiogram 2D Echocardiogram has been performed.  Lucendia Herrlich 07/11/2023, 12:39 PM

## 2023-07-11 NOTE — Progress Notes (Signed)
PHARMACY - ANTICOAGULATION CONSULT NOTE  Pharmacy Consult for heparin Indication:  VTE treatment  No Known Allergies  Patient Measurements: Height: 5\' 9"  (175.3 cm) Weight: 86.2 kg (190 lb) IBW/kg (Calculated) : 70.7 Heparin Dosing Weight: 86.2 kg  Vital Signs: Temp: 97.6 F (36.4 C) (10/10 1600) Temp Source: Oral (10/10 1600) BP: 123/85 (10/10 1200) Pulse Rate: 94 (10/10 1600)  Labs: Recent Labs    07/10/23 1749 07/10/23 1855 07/11/23 0314 07/11/23 1045 07/11/23 1750  HGB 12.8*  --  11.3*  --   --   HCT 40.6  --  35.6*  --   --   PLT 205  --  182  --   --   HEPARINUNFRC  --   --  0.27* 0.36 0.31  CREATININE 0.76  --  0.92  --   --   TROPONINIHS 36* 42*  --   --   --     Estimated Creatinine Clearance: 91.7 mL/min (by C-G formula based on SCr of 0.92 mg/dL).    Assessment: 62 year old male presented with chest pain and shortness of breath, as well as left leg pain from hip down to knee. 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 Per notes, it looks like patient stopped taking this medication himself. Last notes from primary care from January 2023.  CTA chest positive acute PE with large embolic burden and evidence of right heart strain consistent with at least submassive PE. Lower extremity ultrasound consistent with age indeterminate DVT involving the right popliteal vein.  07/11/23 Heparin level @ 1750 = 0.31, therapeutic on 1600 units/hr, although on lower end of range CBC: Hgb mildly low on admission and slightly decreased this AM (consistent with hemodilution but unable to confirm w/o strict I/O); Plt stable WNL RN reports no interruption in therapy and no line issues No bleeding complications noted by RN  Goal of Therapy:  Heparin level 0.3-0.7 units/ml Monitor platelets by anticoagulation protocol: Yes   Plan:  Increase heparin slightly to 1700 units/hr (given clot burden and RHS, would favor keeping heparin level above 0.4  units/ml) Daily CBC and heparin level while on heparin infusion F/u plans for long term anticoagulation: 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

## 2023-07-11 NOTE — Significant Event (Cosign Needed)
07/11/2023 called by Dr. Brendia Sacks concerning a patient in [id number] is a 62 year old with recurrent PE and DVT after taking himself off medication.  There are some confusion on whether or not pulmonary critical care has been asked to evaluate this patient and not.  There is some reference in the ER note that pulmonary critical care was consulted and did not recommend any further interventions.  For completeness of asked Dr. Levy Pupa to review the patient's chart and touch bases with Dr. Irene Limbo to ensure good patient care.  Brett Canales Rynell Ciotti ACNP Acute Care Nurse Practitioner [facility]/Critical [facility] Please consult Amion 07/11/2023, 11:40 AM
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
## 2023-07-12 ENCOUNTER — Inpatient Hospital Stay (HOSPITAL_COMMUNITY): Payer: 59

## 2023-07-12 ENCOUNTER — Encounter (HOSPITAL_COMMUNITY): Payer: Self-pay | Admitting: Internal Medicine

## 2023-07-12 DIAGNOSIS — I2781 Cor pulmonale (chronic): Secondary | ICD-10-CM | POA: Insufficient documentation

## 2023-07-12 DIAGNOSIS — I272 Pulmonary hypertension, unspecified: Secondary | ICD-10-CM

## 2023-07-12 DIAGNOSIS — I2609 Other pulmonary embolism with acute cor pulmonale: Secondary | ICD-10-CM | POA: Diagnosis not present

## 2023-07-12 DIAGNOSIS — I82451 Acute embolism and thrombosis of right peroneal vein: Secondary | ICD-10-CM | POA: Diagnosis not present

## 2023-07-12 HISTORY — PX: IR ANGIOGRAM SELECTIVE EACH ADDITIONAL VESSEL: IMG667

## 2023-07-12 HISTORY — PX: IR INFUSION THROMBOL ARTERIAL INITIAL (MS): IMG5376

## 2023-07-12 HISTORY — PX: IR US GUIDE VASC ACCESS RIGHT: IMG2390

## 2023-07-12 HISTORY — PX: IR ANGIOGRAM PULMONARY BILATERAL SELECTIVE: IMG664

## 2023-07-12 HISTORY — DX: Pulmonary hypertension, unspecified: I27.20

## 2023-07-12 LAB — CBC
HCT: 34.1 % — ABNORMAL LOW (ref 39.0–52.0)
HCT: 36.5 % — ABNORMAL LOW (ref 39.0–52.0)
Hemoglobin: 10.7 g/dL — ABNORMAL LOW (ref 13.0–17.0)
Hemoglobin: 11.6 g/dL — ABNORMAL LOW (ref 13.0–17.0)
MCH: 29 pg (ref 26.0–34.0)
MCH: 29.2 pg (ref 26.0–34.0)
MCHC: 31.4 g/dL (ref 30.0–36.0)
MCHC: 31.8 g/dL (ref 30.0–36.0)
MCV: 91.9 fL (ref 80.0–100.0)
MCV: 92.4 fL (ref 80.0–100.0)
Platelets: 171 10*3/uL (ref 150–400)
Platelets: 179 10*3/uL (ref 150–400)
RBC: 3.69 MIL/uL — ABNORMAL LOW (ref 4.22–5.81)
RBC: 3.97 MIL/uL — ABNORMAL LOW (ref 4.22–5.81)
RDW: 18.4 % — ABNORMAL HIGH (ref 11.5–15.5)
RDW: 18.5 % — ABNORMAL HIGH (ref 11.5–15.5)
WBC: 8.2 10*3/uL (ref 4.0–10.5)
WBC: 8.3 10*3/uL (ref 4.0–10.5)
nRBC: 0 % (ref 0.0–0.2)
nRBC: 0 % (ref 0.0–0.2)

## 2023-07-12 LAB — HEPARIN LEVEL (UNFRACTIONATED)
Heparin Unfractionated: 0.34 [IU]/mL (ref 0.30–0.70)
Heparin Unfractionated: 0.37 [IU]/mL (ref 0.30–0.70)

## 2023-07-12 MED ORDER — SODIUM CHLORIDE 0.9 % IV SOLN: INTRAVENOUS | Status: DC

## 2023-07-12 MED ORDER — FENTANYL CITRATE (PF) 100 MCG/2ML IJ SOLN
INTRAMUSCULAR | Status: DC | PRN
Start: 2023-07-12 — End: 2023-07-14
  Administered 2023-07-12: 50 ug via INTRAVENOUS

## 2023-07-12 MED ORDER — STERILE WATER FOR INJECTION IJ SOLN
INTRAMUSCULAR | Status: AC
  Filled 2023-07-12: qty 10

## 2023-07-12 MED ORDER — HEPARIN (PORCINE) 25000 UT/250ML-% IV SOLN
1800.0000 [IU]/h | INTRAVENOUS | Status: AC
  Administered 2023-07-12 – 2023-07-14 (×4): 1800 [IU]/h via INTRAVENOUS
  Filled 2023-07-12 (×4): qty 250

## 2023-07-12 MED ORDER — ALTEPLASE 2 MG IJ SOLR
4.0000 mg | Freq: Once | INTRAMUSCULAR | Status: AC
  Administered 2023-07-12: 4 mg
  Filled 2023-07-12: qty 4

## 2023-07-12 MED ORDER — LIDOCAINE HCL 1 % IJ SOLN
INTRAMUSCULAR | Status: AC
  Filled 2023-07-12: qty 20

## 2023-07-12 MED ORDER — SODIUM CHLORIDE 0.9 % IV SOLN: 250.0000 mL | INTRAVENOUS | Status: AC | PRN

## 2023-07-12 MED ORDER — SODIUM CHLORIDE 0.9 % IV SOLN
12.0000 mg | Freq: Once | INTRAVENOUS | Status: AC
  Administered 2023-07-12: 12 mg via INTRAVENOUS
  Filled 2023-07-12: qty 12

## 2023-07-12 MED ORDER — SODIUM CHLORIDE 0.9% FLUSH
3.0000 mL | Freq: Two times a day (BID) | INTRAVENOUS | Status: DC
  Administered 2023-07-12 – 2023-07-16 (×8): 3 mL via INTRAVENOUS

## 2023-07-12 MED ORDER — LIDOCAINE HCL 1 % IJ SOLN
20.0000 mL | Freq: Once | INTRAMUSCULAR | Status: AC
  Administered 2023-07-12: 10 mL via INTRADERMAL

## 2023-07-12 MED ORDER — FENTANYL CITRATE (PF) 100 MCG/2ML IJ SOLN
INTRAMUSCULAR | Status: AC
  Filled 2023-07-12: qty 2

## 2023-07-12 MED ORDER — SODIUM CHLORIDE 0.9% FLUSH: 3.0000 mL | INTRAVENOUS | Status: DC | PRN

## 2023-07-12 MED ORDER — IOHEXOL 300 MG/ML  SOLN
100.0000 mL | Freq: Once | INTRAMUSCULAR | Status: AC | PRN
  Administered 2023-07-12: 20 mL via INTRAVENOUS
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
## 2023-07-12 NOTE — Procedures (Signed)
Interventional Radiology Procedure Note  Procedure: Bilateral pulmonary angiogram and initiation of PE lysis  Complications: None  Estimated Blood Loss: None  Recommendations: - Lysis overnight x 12 hrs - Catheters to be removed tomorrow   Signed,  Sterling Big, MD

## 2023-07-12 NOTE — Plan of Care (Signed)
?  Problem: Clinical Measurements: ?Goal: Will remain free from infection ?Outcome: Progressing ?Goal: Diagnostic test results will improve ?Outcome: Progressing ?Goal: Cardiovascular complication will be avoided ?Outcome: Progressing ?  ?Problem: Activity: ?Goal: Risk for activity intolerance will decrease ?Outcome: Progressing ?  ?Problem: Nutrition: ?Goal: Adequate nutrition will be maintained ?Outcome: Progressing ?  ?

## 2023-07-12 NOTE — Progress Notes (Signed)
PHARMACY - ANTICOAGULATION CONSULT NOTE  Pharmacy Consult for heparin Indication:  VTE treatment  No Known Allergies  Patient Measurements: Height: 5\' 9"  (175.3 cm) Weight: 86.2 kg (190 lb) IBW/kg (Calculated) : 70.7 Heparin Dosing Weight: 86.2 kg  Vital Signs: Temp: 98.3 F (36.8 C) (10/10 2328) Temp Source: Oral (10/10 2328) BP: 129/84 (10/11 0700) Pulse Rate: 104 (10/11 0700)  Labs: Recent Labs    07/10/23 1749 07/10/23 1749 07/10/23 1855 07/11/23 0314 07/11/23 1045 07/11/23 1750 07/12/23 0308  HGB 12.8*  --   --  11.3*  --   --  11.6*  HCT 40.6  --   --  35.6*  --   --  36.5*  PLT 205  --   --  182  --   --  179  HEPARINUNFRC  --    < >  --  0.27* 0.36 0.31 0.34  CREATININE 0.76  --   --  0.92  --   --   --   TROPONINIHS 36*  --  42*  --   --   --   --    < > = values in this interval not displayed.    Estimated Creatinine Clearance: 91.7 mL/min (by C-G formula based on SCr of 0.92 mg/dL).    Assessment: 62 year old male presented with chest pain and shortness of breath, as well as left leg pain from hip down to knee. 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 Per notes, it looks like patient stopped taking this medication himself. Last notes from primary care from January 2023.  CTA chest positive acute PE with large embolic burden and evidence of right heart strain consistent with at least submassive PE. Lower extremity ultrasound consistent with age indeterminate DVT involving the right popliteal vein.  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  Selinda Eon, PharmD, BCPS Clinical Pharmacist [facility] 07/12/2023 8:32 AM

## 2023-07-12 NOTE — Progress Notes (Addendum)
Pharmacy: [facility]   Patient is a 62 y.o M with hx PE/DVT (not on [facility] PTA) who presented to the ED on 07/10/23 with c/o of SOB. Chest CT on 10/9 showed acute PE with large embolic burden with evidence of RHS (at least submassive). LE doppler showed, "age indeterminate deep vein thrombosis involving the right popliteal vein."  He's currently on heparin drip for VTE treatment.   - heparin level collected at 1451 is within therapeutic range with 0.37 - no bleeding documented - cbc ok   Goal of Therapy:  Heparin level 0.3-0.7 units/ml Monitor platelets by anticoagulation protocol: Yes  Plan: - increase heparin drip slightly to 1800 units/hr - f/u with AM labs and adjust as needed  - monitor for s/sx bleeding   Dorna Leitz, PharmD, BCPS 07/12/2023 3:37 PM ____________________________________________  Adden: Patient went to IR ~6:22pm for catheter directed thrombolysis. tPA 21 mg x 2 started 1830 with plan to infuse for 12 hrs.  - heparin level q6h, cbc q6h and fibrinogen q6h already ordered by MD - MD requested heparin level goal be 0.3-0.7 while on cath directed tPA. Infusion.   Dorna Leitz, PharmD, BCPS 07/12/2023 8:25 PM

## 2023-07-12 NOTE — Progress Notes (Addendum)
  Progress Note   Patient: Alexander [name] NWG:[id number] DOB: Jan 12, 1961 DOA: 07/10/2023     2 DOS: the patient was seen and examined on 07/12/2023   Brief hospital course: 62 year old man PMH DVT, PE, not on anticoagulation (self discontinued) presenting with chest pain.  Found to have large clot burden on CTA chest was sign suggestive of right heart failure.  EDP consulted with pulmonary medicine who recommended against lytics.  Admitted to stepdown unit for IV heparin and further evaluation.  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 Large clot burden on CTA with RHS on CT, tachycardia, with very mild trop leak, and a new S1Q3 (not T3) EKG not previously present. Echo low normal LVEF 50-55%, no WMA, grade I DD. RV systolic function mildly reduced.  Severe pulmonary hypertension. Remains hypoxic, requiring 4 to 6 L.  Dyspnea at rest and with exertion.  Continues to have resting chest pain. Will ask pulmonary medicine to evaluate.  Continue heparin. Repeat echo in 6 months to see if there is any improvement    DVT (deep venous thrombosis) (HCC) R popliteal DVT indeterminate.  Patient's had a clot there in the past.  Continue IV heparin.   Essential hypertension, benign Hold home BP meds in pt with acute PE with cor pulmonale / RHS.        Subjective:  Still having chest pain at rest. Remains short of breath at rest, worse with exertion. Per nurse more tachycardic with exertion. Requiring 4 to 6 L nasal cannula  Physical Exam: Vitals:   07/12/23 0400 07/12/23 0500 07/12/23 0600 07/12/23 0700  BP: 107/75 111/82 126/78 129/84  Pulse: 100 99 99 (!) 104  Resp: (!) 23 (!) 21 (!) 25 (!) 24  Temp:       TempSrc:      SpO2: 98% 97% 98% 93%  Weight:      Height:       Physical Exam Vitals reviewed.  Constitutional:      General: He is not in acute distress.    Appearance: He is ill-appearing. He is not toxic-appearing.  Cardiovascular:     Rate and Rhythm: Regular rhythm. Tachycardia present.     Heart sounds: No murmur heard.    Comments: Telemetry ST Pulmonary:     Breath sounds: No wheezing, rhonchi or rales.     Comments: Mild increased respiratory effort Musculoskeletal:     Right lower leg: No edema.     Left lower leg: No edema.  Neurological:     Mental Status: He is alert.  Psychiatric:        Mood and Affect: Mood normal.        Behavior: Behavior normal.     Data Reviewed: Echo low normal LVEF 50-55%, no WMA, grade I DD. RV systolic function mildly reduced.  Severe pulmonary hypertension. Hgb stable 11.6  Family Communication: none  Disposition: Status is: Inpatient Remains inpatient appropriate because: large PE w/ hypoxia     Time spent: 35 minutes  Author: Brendia Sacks, MD 07/12/2023 9:39 AM  For on call review [email or web address].
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
## 2023-07-13 ENCOUNTER — Inpatient Hospital Stay (HOSPITAL_COMMUNITY): Payer: 59

## 2023-07-13 ENCOUNTER — Encounter (HOSPITAL_COMMUNITY): Payer: Self-pay | Admitting: Internal Medicine

## 2023-07-13 DIAGNOSIS — I749 Embolism and thrombosis of unspecified artery: Secondary | ICD-10-CM

## 2023-07-13 DIAGNOSIS — I2609 Other pulmonary embolism with acute cor pulmonale: Secondary | ICD-10-CM | POA: Diagnosis not present

## 2023-07-13 DIAGNOSIS — D696 Thrombocytopenia, unspecified: Secondary | ICD-10-CM

## 2023-07-13 DIAGNOSIS — I272 Pulmonary hypertension, unspecified: Secondary | ICD-10-CM | POA: Diagnosis not present

## 2023-07-13 DIAGNOSIS — I82451 Acute embolism and thrombosis of right peroneal vein: Secondary | ICD-10-CM | POA: Diagnosis not present

## 2023-07-13 HISTORY — DX: Embolism and thrombosis of unspecified artery: I74.9

## 2023-07-13 HISTORY — PX: IR THROMB F/U EVAL ART/VEN FINAL DAY (MS): IMG5379

## 2023-07-13 LAB — CBC
HCT: 33.5 % — ABNORMAL LOW (ref 39.0–52.0)
HCT: 32.6 % — ABNORMAL LOW (ref 39.0–52.0)
HCT: 37.4 % — ABNORMAL LOW (ref 39.0–52.0)
Hemoglobin: 10.6 g/dL — ABNORMAL LOW (ref 13.0–17.0)
Hemoglobin: 10.3 g/dL — ABNORMAL LOW (ref 13.0–17.0)
Hemoglobin: 11.6 g/dL — ABNORMAL LOW (ref 13.0–17.0)
MCH: 29.4 pg (ref 26.0–34.0)
MCH: 29.3 pg (ref 26.0–34.0)
MCH: 29.4 pg (ref 26.0–34.0)
MCHC: 31.6 g/dL (ref 30.0–36.0)
MCHC: 31.6 g/dL (ref 30.0–36.0)
MCHC: 31 g/dL (ref 30.0–36.0)
MCV: 93.1 fL (ref 80.0–100.0)
MCV: 92.6 fL (ref 80.0–100.0)
MCV: 94.7 fL (ref 80.0–100.0)
Platelets: 119 10*3/uL — ABNORMAL LOW (ref 150–400)
Platelets: 132 10*3/uL — ABNORMAL LOW (ref 150–400)
Platelets: 147 10*3/uL — ABNORMAL LOW (ref 150–400)
RBC: 3.6 MIL/uL — ABNORMAL LOW (ref 4.22–5.81)
RBC: 3.52 MIL/uL — ABNORMAL LOW (ref 4.22–5.81)
RBC: 3.95 MIL/uL — ABNORMAL LOW (ref 4.22–5.81)
RDW: 18.3 % — ABNORMAL HIGH (ref 11.5–15.5)
RDW: 18.4 % — ABNORMAL HIGH (ref 11.5–15.5)
RDW: 18.3 % — ABNORMAL HIGH (ref 11.5–15.5)
WBC: 8.8 10*3/uL (ref 4.0–10.5)
WBC: 7.8 10*3/uL (ref 4.0–10.5)
WBC: 7 10*3/uL (ref 4.0–10.5)
nRBC: 0 % (ref 0.0–0.2)
nRBC: 0 % (ref 0.0–0.2)
nRBC: 0 % (ref 0.0–0.2)

## 2023-07-13 LAB — FIBRINOGEN
Fibrinogen: 598 mg/dL — ABNORMAL HIGH (ref 210–475)
Fibrinogen: 623 mg/dL — ABNORMAL HIGH (ref 210–475)
Fibrinogen: 650 mg/dL — ABNORMAL HIGH (ref 210–475)
Fibrinogen: 748 mg/dL — ABNORMAL HIGH (ref 210–475)

## 2023-07-13 LAB — HEPARIN LEVEL (UNFRACTIONATED)
Heparin Unfractionated: 0.25 [IU]/mL — ABNORMAL LOW (ref 0.30–0.70)
Heparin Unfractionated: 0.32 [IU]/mL (ref 0.30–0.70)
Heparin Unfractionated: 0.38 [IU]/mL (ref 0.30–0.70)
Heparin Unfractionated: 0.4 [IU]/mL (ref 0.30–0.70)

## 2023-07-13 MED ORDER — MUPIROCIN 2 % EX OINT
TOPICAL_OINTMENT | Freq: Two times a day (BID) | CUTANEOUS | Status: DC
  Filled 2023-07-13 (×2): qty 22

## 2023-07-13 NOTE — Progress Notes (Signed)
Referring Physician(s): Byrum,R   Supervising Physician: Marliss Coots  Patient Status:  [facility] - In-pt  Chief Complaint:  SOB, Bilateral PE S/p catheter directed lysis by Dr. Archer Asa on 07/12/23   Subjective:  Patient laying in bed, NAD.  Reports that he feels better today, seems his breathing improved. Still on 5 L via [place].  Only complaint is that he is hungry.  Patient was informed that the PE catheter/sheath will be removed by IR team today.   Allergies: Patient has no known allergies.  Medications: Prior to Admission medications   Medication Sig Start Date End Date Taking? Authorizing Provider  gabapentin (NEURONTIN) 100 MG capsule Take 1 capsule (100 mg total) by mouth 3 (three) times daily as needed. Patient taking differently: Take 100 mg by mouth daily. 04/16/23 07/10/23 Yes Trifan, Kermit Balo, MD  naproxen (NAPROSYN) 500 MG tablet Take 500 mg by mouth 2 (two) times daily as needed (for pain- take with food).   Yes [provider]  allopurinol (ZYLOPRIM) 100 MG tablet TAKE 1 TABLET(100 MG) BY MOUTH DAILY Patient not taking: Reported on 07/10/2023 09/15/21   Jarold Motto, PA  bacitracin ointment Apply 1 Application topically 2 (two) times daily. Patient not taking: Reported on 07/10/2023 03/26/22   Sherian Maroon A, PA  cephALEXin (KEFLEX) 500 MG capsule Take 1 capsule (500 mg total) by mouth 4 (four) times daily. Patient not taking: Reported on 07/10/2023 05/14/22   Mancel Bale, MD  cephALEXin (KEFLEX) 500 MG capsule Take 1 capsule (500 mg total) by mouth 4 (four) times daily. Patient not taking: Reported on 07/10/2023 05/14/22   Mancel Bale, MD  gabapentin (NEURONTIN) 100 MG capsule Take 1 capsule (100 mg total) by mouth 3 (three) times daily. Patient not taking: Reported on 07/10/2023 12/07/20   Jarold Motto, PA  methylPREDNISolone (MEDROL DOSEPAK) 4 MG TBPK tablet Use as directed Patient not taking: Reported on 07/10/2023 04/16/23   Terald Sleeper,  MD  metoprolol tartrate (LOPRESSOR) 50 MG tablet TAKE 1 TABLET(50 MG) BY MOUTH TWICE DAILY Patient not taking: Reported on 07/10/2023 09/01/21   Jarold Motto, PA  mupirocin cream (BACTROBAN) 2 % Twice a day to rash on legs Patient not taking: Reported on 07/10/2023 05/14/22   Mancel Bale, MD  mupirocin cream (BACTROBAN) 2 % Apply 1 Application topically 2 (two) times daily. Apply to rash on legs Patient not taking: Reported on 07/10/2023 05/14/22   Mancel Bale, MD  rivaroxaban (XARELTO) 20 MG TABS tablet Take 1 tablet (20 mg total) by mouth daily with supper. Patient not taking: Reported on 07/10/2023 02/22/21   Ardith Dark, MD  tadalafil (CIALIS) 20 MG tablet Take 0.5-1 tablets (10-20 mg total) by mouth every other day as needed for erectile dysfunction. Patient not taking: Reported on 07/10/2023 06/01/21   Jarold Motto, PA     Vital Signs: BP (!) 120/90 (BP Location: Right Arm)   Pulse 82   Temp 98.1 F (36.7 C) (Oral)   Resp 18   Ht 5\' 9"  (1.753 m)   Wt 190 lb (86.2 kg)   SpO2 97%   BMI 28.06 kg/m   Physical Exam Vitals and nursing note reviewed.  Constitutional:      General: Patient is not in acute distress.    Appearance: Normal appearance. Patient is not ill-appearing.  HENT:     Head: Normocephalic and atraumatic.     Mouth/Throat:     Mouth: Mucous membranes are moist.     Pharynx: Oropharynx is  clear.  Cardiovascular:     Rate and Rhythm: Normal rate and regular rhythm.     Pulses: Normal pulses.     Heart sounds: Normal heart sounds.  Pulmonary:     Effort: Pulmonary effort is normal.     Breath sounds: Normal breath sounds.  Abdominal:     General: Abdomen is flat. Bowel sounds are normal.     Palpations: Abdomen is soft.  Musculoskeletal:     Cervical back: Neck supple.  Skin:    General: Skin is warm and dry.     Coloration: Skin is not jaundiced or pale. + PE catheter and sheath in right CFV, dressed appropriately. Non tender, dressing clean and  dry.  Neurological:     Mental Status: Patient is alert and oriented to person, place, and time.  Psychiatric:        Mood and Affect: Mood normal.        Behavior: Behavior normal.        Judgment: Judgment normal.     Imaging: IR Angiogram Pulmonary Bilateral Selective  Result Date: 07/12/2023 INDICATION: 62 year old male with submassive (intermediate high risk) PE. He continues to suffer with dyspnea despite 2 days of intravenous heparin therapy. Therefore, he is a candidate for percutaneous thrombolysis. EXAM: BILATERAL PULMONARY ARTERIOGRAPHY; IR INFUSION THROMBOL ARTERIAL INITIAL (MS); ADDITIONAL ARTERIOGRAPHY; IR ULTRASOUND GUIDANCE VASC ACCESS RIGHT COMPARISON:  CT a chest 07/10/2023 MEDICATIONS: 4 mg tPA injected directly into the pulmonary arteries, 2 mg on the right and 2 mg on the left ANESTHESIA/SEDATION: 50 mcg fentanyl administered during the course of the procedure for pain control. This does not constitute moderate sedation. FLUOROSCOPY TIME:  Radiation exposure index: 31.5 mGy reference air kerma COMPLICATIONS: None immediate. TECHNIQUE: Informed written consent was obtained from the patient after a thorough discussion of the procedural risks, benefits and alternatives. All questions were addressed. Maximal Sterile Barrier Technique was utilized including caps, mask, sterile gowns, sterile gloves, sterile drape, hand hygiene and skin antiseptic. A timeout was performed prior to the initiation of the procedure. The right common femoral vein was interrogated with ultrasound and found to be widely patent. An image was obtained and stored for the medical record. Local anesthesia was attained by infiltration with 1% lidocaine. Under real-time sonographic guidance, the vessel was punctured with a 21 gauge micropuncture needle. Using standard technique, the initial micro needle was exchanged over a 0.018 micro wire for a transitional 4 [place] micro sheath. Using the same technique, a second  access was obtained with a separate 21 gauge micro puncture needle. Using standard technique, the micro needle was exchanged over a 0.018 microwire for a transitional 4 [place] micro sheath. The micro sheath were then exchanged over Bentson wires for dual 6 French vascular sheaths. Through the more medial sheath, a 6 French angled pigtail catheter was advanced over the Bentson wire. The pigtail catheter was navigated through the heart and into the main pulmonary artery. A pulmonary arteriogram was performed. Diminished pulmonary flow is visible bilaterally. Thrombus in both lower lobe pulmonary arteries. An attempt to measure pulmonary arterial pressures was made, however the pressure measuring appointment was not accessible. Therefore, the decision was made to move forward without pressures. The pigtail catheter was exchanged over a Rosen wire for a vert catheter. The vert catheter was used to select the left lower lobe pulmonary artery. A left lower lobe pulmonary angiogram was performed confirming multiple filling defects in the left lower lobe pulmonary artery consistent with PE. The vert catheter  was carefully advanced distally into the left pulmonary artery. A rose in wire was placed. The catheter was removed. Next, the angled pigtail catheter was introduced through the more lateral 6 French sheath. As before, the catheter was carefully navigated through the heart and into the right pulmonary artery. Contrast injection confirms nonocclusive thrombus within the right lower lobe pulmonary artery. A rose in wire was advanced into the distal right lower lobe pulmonary artery. A 90 cm 10 cm infusion length UniFuse infusion catheter was next advanced over the wire and positioned in the right main and right lower lobe pulmonary artery. 2 mg of tPA was infused through the catheter. Next, a 90 cm 5 cm infusion length UniFuse infusion catheter was advanced over the more medial rose in wire and positioned in the left  lower lobe pulmonary artery at the site of thrombus. 2 mg of tPA was infused through the catheter. Both 6 French sheaths were secured to the skin with 0 Prolene suture. The infusion catheters were further secured to the sheaths. Sterile bandages were applied. Bilateral pulmonary artery thrombolysis was initiated. FINDINGS: Pulmonary arteriography demonstrating bilateral lower lobe pulmonary emboli. IMPRESSION: Successful initiation of bilateral pulmonary arterial catheter directed thrombolysis. Electronically Signed   By: Malachy Moan M.D.   On: 07/12/2023 18:43   IR US Guide Vasc Access Right  Result Date: 07/12/2023 INDICATION: 62 year old male with submassive (intermediate high risk) PE. He continues to suffer with dyspnea despite 2 days of intravenous heparin therapy. Therefore, he is a candidate for percutaneous thrombolysis. EXAM: BILATERAL PULMONARY ARTERIOGRAPHY; IR INFUSION THROMBOL ARTERIAL INITIAL (MS); ADDITIONAL ARTERIOGRAPHY; IR ULTRASOUND GUIDANCE VASC ACCESS RIGHT COMPARISON:  CT a chest 07/10/2023 MEDICATIONS: 4 mg tPA injected directly into the pulmonary arteries, 2 mg on the right and 2 mg on the left ANESTHESIA/SEDATION: 50 mcg fentanyl administered during the course of the procedure for pain control. This does not constitute moderate sedation. FLUOROSCOPY TIME:  Radiation exposure index: 31.5 mGy reference air kerma COMPLICATIONS: None immediate. TECHNIQUE: Informed written consent was obtained from the patient after a thorough discussion of the procedural risks, benefits and alternatives. All questions were addressed. Maximal Sterile Barrier Technique was utilized including caps, mask, sterile gowns, sterile gloves, sterile drape, hand hygiene and skin antiseptic. A timeout was performed prior to the initiation of the procedure. The right common femoral vein was interrogated with ultrasound and found to be widely patent. An image was obtained and stored for the medical record. Local  anesthesia was attained by infiltration with 1% lidocaine. Under real-time sonographic guidance, the vessel was punctured with a 21 gauge micropuncture needle. Using standard technique, the initial micro needle was exchanged over a 0.018 micro wire for a transitional 4 [place] micro sheath. Using the same technique, a second access was obtained with a separate 21 gauge micro puncture needle. Using standard technique, the micro needle was exchanged over a 0.018 microwire for a transitional 4 [place] micro sheath. The micro sheath were then exchanged over Bentson wires for dual 6 French vascular sheaths. Through the more medial sheath, a 6 French angled pigtail catheter was advanced over the Bentson wire. The pigtail catheter was navigated through the heart and into the main pulmonary artery. A pulmonary arteriogram was performed. Diminished pulmonary flow is visible bilaterally. Thrombus in both lower lobe pulmonary arteries. An attempt to measure pulmonary arterial pressures was made, however the pressure measuring appointment was not accessible. Therefore, the decision was made to move forward without pressures. The pigtail catheter was exchanged over  a Rosen wire for a vert catheter. The vert catheter was used to select the left lower lobe pulmonary artery. A left lower lobe pulmonary angiogram was performed confirming multiple filling defects in the left lower lobe pulmonary artery consistent with PE. The vert catheter was carefully advanced distally into the left pulmonary artery. A rose in wire was placed. The catheter was removed. Next, the angled pigtail catheter was introduced through the more lateral 6 French sheath. As before, the catheter was carefully navigated through the heart and into the right pulmonary artery. Contrast injection confirms nonocclusive thrombus within the right lower lobe pulmonary artery. A rose in wire was advanced into the distal right lower lobe pulmonary artery. A 90 cm 10 cm  infusion length UniFuse infusion catheter was next advanced over the wire and positioned in the right main and right lower lobe pulmonary artery. 2 mg of tPA was infused through the catheter. Next, a 90 cm 5 cm infusion length UniFuse infusion catheter was advanced over the more medial rose in wire and positioned in the left lower lobe pulmonary artery at the site of thrombus. 2 mg of tPA was infused through the catheter. Both 6 French sheaths were secured to the skin with 0 Prolene suture. The infusion catheters were further secured to the sheaths. Sterile bandages were applied. Bilateral pulmonary artery thrombolysis was initiated. FINDINGS: Pulmonary arteriography demonstrating bilateral lower lobe pulmonary emboli. IMPRESSION: Successful initiation of bilateral pulmonary arterial catheter directed thrombolysis. Electronically Signed   By: Malachy Moan M.D.   On: 07/12/2023 18:43   IR INFUSION THROMBOL ARTERIAL INITIAL (MS)  Result Date: 07/12/2023 INDICATION: 62 year old male with submassive (intermediate high risk) PE. He continues to suffer with dyspnea despite 2 days of intravenous heparin therapy. Therefore, he is a candidate for percutaneous thrombolysis. EXAM: BILATERAL PULMONARY ARTERIOGRAPHY; IR INFUSION THROMBOL ARTERIAL INITIAL (MS); ADDITIONAL ARTERIOGRAPHY; IR ULTRASOUND GUIDANCE VASC ACCESS RIGHT COMPARISON:  CT a chest 07/10/2023 MEDICATIONS: 4 mg tPA injected directly into the pulmonary arteries, 2 mg on the right and 2 mg on the left ANESTHESIA/SEDATION: 50 mcg fentanyl administered during the course of the procedure for pain control. This does not constitute moderate sedation. FLUOROSCOPY TIME:  Radiation exposure index: 31.5 mGy reference air kerma COMPLICATIONS: None immediate. TECHNIQUE: Informed written consent was obtained from the patient after a thorough discussion of the procedural risks, benefits and alternatives. All questions were addressed. Maximal Sterile Barrier Technique  was utilized including caps, mask, sterile gowns, sterile gloves, sterile drape, hand hygiene and skin antiseptic. A timeout was performed prior to the initiation of the procedure. The right common femoral vein was interrogated with ultrasound and found to be widely patent. An image was obtained and stored for the medical record. Local anesthesia was attained by infiltration with 1% lidocaine. Under real-time sonographic guidance, the vessel was punctured with a 21 gauge micropuncture needle. Using standard technique, the initial micro needle was exchanged over a 0.018 micro wire for a transitional 4 [place] micro sheath. Using the same technique, a second access was obtained with a separate 21 gauge micro puncture needle. Using standard technique, the micro needle was exchanged over a 0.018 microwire for a transitional 4 [place] micro sheath. The micro sheath were then exchanged over Bentson wires for dual 6 French vascular sheaths. Through the more medial sheath, a 6 French angled pigtail catheter was advanced over the Bentson wire. The pigtail catheter was navigated through the heart and into the main pulmonary artery. A pulmonary arteriogram was performed. Diminished  pulmonary flow is visible bilaterally. Thrombus in both lower lobe pulmonary arteries. An attempt to measure pulmonary arterial pressures was made, however the pressure measuring appointment was not accessible. Therefore, the decision was made to move forward without pressures. The pigtail catheter was exchanged over a Rosen wire for a vert catheter. The vert catheter was used to select the left lower lobe pulmonary artery. A left lower lobe pulmonary angiogram was performed confirming multiple filling defects in the left lower lobe pulmonary artery consistent with PE. The vert catheter was carefully advanced distally into the left pulmonary artery. A rose in wire was placed. The catheter was removed. Next, the angled pigtail catheter was introduced  through the more lateral 6 French sheath. As before, the catheter was carefully navigated through the heart and into the right pulmonary artery. Contrast injection confirms nonocclusive thrombus within the right lower lobe pulmonary artery. A rose in wire was advanced into the distal right lower lobe pulmonary artery. A 90 cm 10 cm infusion length UniFuse infusion catheter was next advanced over the wire and positioned in the right main and right lower lobe pulmonary artery. 2 mg of tPA was infused through the catheter. Next, a 90 cm 5 cm infusion length UniFuse infusion catheter was advanced over the more medial rose in wire and positioned in the left lower lobe pulmonary artery at the site of thrombus. 2 mg of tPA was infused through the catheter. Both 6 French sheaths were secured to the skin with 0 Prolene suture. The infusion catheters were further secured to the sheaths. Sterile bandages were applied. Bilateral pulmonary artery thrombolysis was initiated. FINDINGS: Pulmonary arteriography demonstrating bilateral lower lobe pulmonary emboli. IMPRESSION: Successful initiation of bilateral pulmonary arterial catheter directed thrombolysis. Electronically Signed   By: Malachy Moan M.D.   On: 07/12/2023 18:43   IR INFUSION THROMBOL ARTERIAL INITIAL (MS)  Result Date: 07/12/2023 INDICATION: 62 year old male with submassive (intermediate high risk) PE. He continues to suffer with dyspnea despite 2 days of intravenous heparin therapy. Therefore, he is a candidate for percutaneous thrombolysis. EXAM: BILATERAL PULMONARY ARTERIOGRAPHY; IR INFUSION THROMBOL ARTERIAL INITIAL (MS); ADDITIONAL ARTERIOGRAPHY; IR ULTRASOUND GUIDANCE VASC ACCESS RIGHT COMPARISON:  CT a chest 07/10/2023 MEDICATIONS: 4 mg tPA injected directly into the pulmonary arteries, 2 mg on the right and 2 mg on the left ANESTHESIA/SEDATION: 50 mcg fentanyl administered during the course of the procedure for pain control. This does not constitute  moderate sedation. FLUOROSCOPY TIME:  Radiation exposure index: 31.5 mGy reference air kerma COMPLICATIONS: None immediate. TECHNIQUE: Informed written consent was obtained from the patient after a thorough discussion of the procedural risks, benefits and alternatives. All questions were addressed. Maximal Sterile Barrier Technique was utilized including caps, mask, sterile gowns, sterile gloves, sterile drape, hand hygiene and skin antiseptic. A timeout was performed prior to the initiation of the procedure. The right common femoral vein was interrogated with ultrasound and found to be widely patent. An image was obtained and stored for the medical record. Local anesthesia was attained by infiltration with 1% lidocaine. Under real-time sonographic guidance, the vessel was punctured with a 21 gauge micropuncture needle. Using standard technique, the initial micro needle was exchanged over a 0.018 micro wire for a transitional 4 [place] micro sheath. Using the same technique, a second access was obtained with a separate 21 gauge micro puncture needle. Using standard technique, the micro needle was exchanged over a 0.018 microwire for a transitional 4 [place] micro sheath. The micro sheath were then exchanged over  Bentson wires for dual 6 French vascular sheaths. Through the more medial sheath, a 6 French angled pigtail catheter was advanced over the Bentson wire. The pigtail catheter was navigated through the heart and into the main pulmonary artery. A pulmonary arteriogram was performed. Diminished pulmonary flow is visible bilaterally. Thrombus in both lower lobe pulmonary arteries. An attempt to measure pulmonary arterial pressures was made, however the pressure measuring appointment was not accessible. Therefore, the decision was made to move forward without pressures. The pigtail catheter was exchanged over a Rosen wire for a vert catheter. The vert catheter was used to select the left lower lobe pulmonary artery.  A left lower lobe pulmonary angiogram was performed confirming multiple filling defects in the left lower lobe pulmonary artery consistent with PE. The vert catheter was carefully advanced distally into the left pulmonary artery. A rose in wire was placed. The catheter was removed. Next, the angled pigtail catheter was introduced through the more lateral 6 French sheath. As before, the catheter was carefully navigated through the heart and into the right pulmonary artery. Contrast injection confirms nonocclusive thrombus within the right lower lobe pulmonary artery. A rose in wire was advanced into the distal right lower lobe pulmonary artery. A 90 cm 10 cm infusion length UniFuse infusion catheter was next advanced over the wire and positioned in the right main and right lower lobe pulmonary artery. 2 mg of tPA was infused through the catheter. Next, a 90 cm 5 cm infusion length UniFuse infusion catheter was advanced over the more medial rose in wire and positioned in the left lower lobe pulmonary artery at the site of thrombus. 2 mg of tPA was infused through the catheter. Both 6 French sheaths were secured to the skin with 0 Prolene suture. The infusion catheters were further secured to the sheaths. Sterile bandages were applied. Bilateral pulmonary artery thrombolysis was initiated. FINDINGS: Pulmonary arteriography demonstrating bilateral lower lobe pulmonary emboli. IMPRESSION: Successful initiation of bilateral pulmonary arterial catheter directed thrombolysis. Electronically Signed   By: Malachy Moan M.D.   On: 07/12/2023 18:43   IR Angiogram Selective Each Additional Vessel  Result Date: 07/12/2023 INDICATION: 62 year old male with submassive (intermediate high risk) PE. He continues to suffer with dyspnea despite 2 days of intravenous heparin therapy. Therefore, he is a candidate for percutaneous thrombolysis. EXAM: BILATERAL PULMONARY ARTERIOGRAPHY; IR INFUSION THROMBOL ARTERIAL INITIAL (MS);  ADDITIONAL ARTERIOGRAPHY; IR ULTRASOUND GUIDANCE VASC ACCESS RIGHT COMPARISON:  CT a chest 07/10/2023 MEDICATIONS: 4 mg tPA injected directly into the pulmonary arteries, 2 mg on the right and 2 mg on the left ANESTHESIA/SEDATION: 50 mcg fentanyl administered during the course of the procedure for pain control. This does not constitute moderate sedation. FLUOROSCOPY TIME:  Radiation exposure index: 31.5 mGy reference air kerma COMPLICATIONS: None immediate. TECHNIQUE: Informed written consent was obtained from the patient after a thorough discussion of the procedural risks, benefits and alternatives. All questions were addressed. Maximal Sterile Barrier Technique was utilized including caps, mask, sterile gowns, sterile gloves, sterile drape, hand hygiene and skin antiseptic. A timeout was performed prior to the initiation of the procedure. The right common femoral vein was interrogated with ultrasound and found to be widely patent. An image was obtained and stored for the medical record. Local anesthesia was attained by infiltration with 1% lidocaine. Under real-time sonographic guidance, the vessel was punctured with a 21 gauge micropuncture needle. Using standard technique, the initial micro needle was exchanged over a 0.018 micro wire for a transitional 4 [place]  micro sheath. Using the same technique, a second access was obtained with a separate 21 gauge micro puncture needle. Using standard technique, the micro needle was exchanged over a 0.018 microwire for a transitional 4 [place] micro sheath. The micro sheath were then exchanged over Bentson wires for dual 6 French vascular sheaths. Through the more medial sheath, a 6 French angled pigtail catheter was advanced over the Bentson wire. The pigtail catheter was navigated through the heart and into the main pulmonary artery. A pulmonary arteriogram was performed. Diminished pulmonary flow is visible bilaterally. Thrombus in both lower lobe pulmonary arteries. An  attempt to measure pulmonary arterial pressures was made, however the pressure measuring appointment was not accessible. Therefore, the decision was made to move forward without pressures. The pigtail catheter was exchanged over a Rosen wire for a vert catheter. The vert catheter was used to select the left lower lobe pulmonary artery. A left lower lobe pulmonary angiogram was performed confirming multiple filling defects in the left lower lobe pulmonary artery consistent with PE. The vert catheter was carefully advanced distally into the left pulmonary artery. A rose in wire was placed. The catheter was removed. Next, the angled pigtail catheter was introduced through the more lateral 6 French sheath. As before, the catheter was carefully navigated through the heart and into the right pulmonary artery. Contrast injection confirms nonocclusive thrombus within the right lower lobe pulmonary artery. A rose in wire was advanced into the distal right lower lobe pulmonary artery. A 90 cm 10 cm infusion length UniFuse infusion catheter was next advanced over the wire and positioned in the right main and right lower lobe pulmonary artery. 2 mg of tPA was infused through the catheter. Next, a 90 cm 5 cm infusion length UniFuse infusion catheter was advanced over the more medial rose in wire and positioned in the left lower lobe pulmonary artery at the site of thrombus. 2 mg of tPA was infused through the catheter. Both 6 French sheaths were secured to the skin with 0 Prolene suture. The infusion catheters were further secured to the sheaths. Sterile bandages were applied. Bilateral pulmonary artery thrombolysis was initiated. FINDINGS: Pulmonary arteriography demonstrating bilateral lower lobe pulmonary emboli. IMPRESSION: Successful initiation of bilateral pulmonary arterial catheter directed thrombolysis. Electronically Signed   By: Malachy Moan M.D.   On: 07/12/2023 18:43   IR Angiogram Selective Each Additional  Vessel  Result Date: 07/12/2023 INDICATION: 62 year old male with submassive (intermediate high risk) PE. He continues to suffer with dyspnea despite 2 days of intravenous heparin therapy. Therefore, he is a candidate for percutaneous thrombolysis. EXAM: BILATERAL PULMONARY ARTERIOGRAPHY; IR INFUSION THROMBOL ARTERIAL INITIAL (MS); ADDITIONAL ARTERIOGRAPHY; IR ULTRASOUND GUIDANCE VASC ACCESS RIGHT COMPARISON:  CT a chest 07/10/2023 MEDICATIONS: 4 mg tPA injected directly into the pulmonary arteries, 2 mg on the right and 2 mg on the left ANESTHESIA/SEDATION: 50 mcg fentanyl administered during the course of the procedure for pain control. This does not constitute moderate sedation. FLUOROSCOPY TIME:  Radiation exposure index: 31.5 mGy reference air kerma COMPLICATIONS: None immediate. TECHNIQUE: Informed written consent was obtained from the patient after a thorough discussion of the procedural risks, benefits and alternatives. All questions were addressed. Maximal Sterile Barrier Technique was utilized including caps, mask, sterile gowns, sterile gloves, sterile drape, hand hygiene and skin antiseptic. A timeout was performed prior to the initiation of the procedure. The right common femoral vein was interrogated with ultrasound and found to be widely patent. An image was obtained and stored for  the medical record. Local anesthesia was attained by infiltration with 1% lidocaine. Under real-time sonographic guidance, the vessel was punctured with a 21 gauge micropuncture needle. Using standard technique, the initial micro needle was exchanged over a 0.018 micro wire for a transitional 4 [place] micro sheath. Using the same technique, a second access was obtained with a separate 21 gauge micro puncture needle. Using standard technique, the micro needle was exchanged over a 0.018 microwire for a transitional 4 [place] micro sheath. The micro sheath were then exchanged over Bentson wires for dual 6 French vascular  sheaths. Through the more medial sheath, a 6 French angled pigtail catheter was advanced over the Bentson wire. The pigtail catheter was navigated through the heart and into the main pulmonary artery. A pulmonary arteriogram was performed. Diminished pulmonary flow is visible bilaterally. Thrombus in both lower lobe pulmonary arteries. An attempt to measure pulmonary arterial pressures was made, however the pressure measuring appointment was not accessible. Therefore, the decision was made to move forward without pressures. The pigtail catheter was exchanged over a Rosen wire for a vert catheter. The vert catheter was used to select the left lower lobe pulmonary artery. A left lower lobe pulmonary angiogram was performed confirming multiple filling defects in the left lower lobe pulmonary artery consistent with PE. The vert catheter was carefully advanced distally into the left pulmonary artery. A rose in wire was placed. The catheter was removed. Next, the angled pigtail catheter was introduced through the more lateral 6 French sheath. As before, the catheter was carefully navigated through the heart and into the right pulmonary artery. Contrast injection confirms nonocclusive thrombus within the right lower lobe pulmonary artery. A rose in wire was advanced into the distal right lower lobe pulmonary artery. A 90 cm 10 cm infusion length UniFuse infusion catheter was next advanced over the wire and positioned in the right main and right lower lobe pulmonary artery. 2 mg of tPA was infused through the catheter. Next, a 90 cm 5 cm infusion length UniFuse infusion catheter was advanced over the more medial rose in wire and positioned in the left lower lobe pulmonary artery at the site of thrombus. 2 mg of tPA was infused through the catheter. Both 6 French sheaths were secured to the skin with 0 Prolene suture. The infusion catheters were further secured to the sheaths. Sterile bandages were applied. Bilateral  pulmonary artery thrombolysis was initiated. FINDINGS: Pulmonary arteriography demonstrating bilateral lower lobe pulmonary emboli. IMPRESSION: Successful initiation of bilateral pulmonary arterial catheter directed thrombolysis. Electronically Signed   By: Malachy Moan M.D.   On: 07/12/2023 18:43   DG Chest Port 1 View  Result Date: 07/12/2023 CLINICAL DATA:  Shortness of breath. EXAM: PORTABLE CHEST 1 VIEW COMPARISON:  07/10/2023 FINDINGS: Low lung volumes. Few densities at the left lung base could represent atelectasis. Cardiomediastinal silhouette is within normal limits and stable. Negative for pneumothorax. IMPRESSION: 1. Low lung volumes with left basilar densities. These left basilar densities may represent atelectasis. Electronically Signed   By: Richarda Overlie M.D.   On: 07/12/2023 14:47   ECHOCARDIOGRAM COMPLETE  Result Date: 07/11/2023    ECHOCARDIOGRAM REPORT   Patient Name:   Alexander [name] Date of Exam: 07/11/2023 Medical Rec #:  [id number]     Height:       69.0 in Accession #:    [id number]    Weight:       190.0 lb Date of Birth:  18-Aug-1961     BSA:  2.021 m Patient Age:    62 years      BP:           147/89 mmHg Patient Gender: M             HR:           93 bpm. Exam Location:  Inpatient Procedure: 2D Echo, Cardiac Doppler, Color Doppler and Intracardiac            Opacification Agent Indications:    Pulmonary Embolus I26.09  History:        Patient has prior history of Echocardiogram examinations, most                 recent 01/15/2020. PAD; Risk Factors:Hypertension and                 Dyslipidemia.  Sonographer:    Lucendia Herrlich RCS Referring Phys: [phone] JARED M GARDNER IMPRESSIONS  1. Left ventricular ejection fraction, by estimation, is 50 to 55%. The left ventricle has low normal function. The left ventricle has no regional wall motion abnormalities. Left ventricular diastolic parameters are consistent with Grade I diastolic dysfunction (impaired relaxation). There is the  interventricular septum is flattened in diastole ('D' shaped left ventricle), consistent with right ventricular volume overload.  2. Right ventricular systolic function is mildly reduced. The right ventricular size is normal. There is severely elevated pulmonary artery systolic pressure.  3. The mitral valve is normal in structure. No evidence of mitral valve regurgitation. No evidence of mitral stenosis.  4. The aortic valve is tricuspid. Aortic valve regurgitation is trivial. No aortic stenosis is present.  5. The inferior vena cava is dilated in size with >50% respiratory variability, suggesting right atrial pressure of 8 mmHg. FINDINGS  Left Ventricle: Left ventricular ejection fraction, by estimation, is 50 to 55%. The left ventricle has low normal function. The left ventricle has no regional wall motion abnormalities. Definity contrast agent was given IV to delineate the left ventricular endocardial borders. The left ventricular internal cavity size was normal in size. There is no left ventricular hypertrophy. The interventricular septum is flattened in diastole ('D' shaped left ventricle), consistent with right ventricular volume overload. Left ventricular diastolic parameters are consistent with Grade I diastolic dysfunction (impaired relaxation). Normal left ventricular filling pressure. Right Ventricle: The right ventricular size is normal. No increase in right ventricular wall thickness. Right ventricular systolic function is mildly reduced. There is severely elevated pulmonary artery systolic pressure. The tricuspid regurgitant velocity is 3.96 m/s, and with an assumed right atrial pressure of 8 mmHg, the estimated right ventricular systolic pressure is 70.7 mmHg. Left Atrium: Left atrial size was normal in size. Right Atrium: Right atrial size was normal in size. Pericardium: There is no evidence of pericardial effusion. Mitral Valve: The mitral valve is normal in structure. No evidence of mitral valve  regurgitation. No evidence of mitral valve stenosis. Tricuspid Valve: The tricuspid valve is normal in structure. Tricuspid valve regurgitation is mild . No evidence of tricuspid stenosis. Aortic Valve: The aortic valve is tricuspid. Aortic valve regurgitation is trivial. No aortic stenosis is present. Aortic valve peak gradient measures 3.6 mmHg. Pulmonic Valve: The pulmonic valve was normal in structure. Pulmonic valve regurgitation is trivial. No evidence of pulmonic stenosis. Aorta: The aortic root is normal in size and structure. Venous: The inferior vena cava is dilated in size with greater than 50% respiratory variability, suggesting right atrial pressure of 8 mmHg. IAS/Shunts: No atrial level shunt detected  by color flow Doppler.  LEFT VENTRICLE PLAX 2D LVIDd:         5.00 cm   Diastology LVIDs:         3.40 cm   LV e' medial:    6.30 cm/s LV PW:         0.70 cm   LV E/e' medial:  5.7 LV IVS:        0.80 cm   LV e' lateral:   12.50 cm/s LVOT diam:     2.10 cm   LV E/e' lateral: 2.9 LV SV:         32 LV SV Index:   16 LVOT Area:     3.46 cm  RIGHT VENTRICLE            IVC RV S prime:     9.45 cm/s  IVC diam: 2.30 cm TAPSE (M-mode): 1.2 cm LEFT ATRIUM             Index        RIGHT ATRIUM           Index LA diam:        3.10 cm 1.53 cm/m   RA Area:     14.20 cm LA Vol (A2C):   33.0 ml 16.32 ml/m  RA Volume:   30.00 ml  14.84 ml/m LA Vol (A4C):   28.4 ml 14.02 ml/m LA Biplane Vol: 35.0 ml 17.31 ml/m  AORTIC VALVE AV Area (Vmax): 2.40 cm AV Vmax:        95.40 cm/s AV Peak Grad:   3.6 mmHg LVOT Vmax:      66.10 cm/s LVOT Vmean:     42.533 cm/s LVOT VTI:       0.092 m  AORTA Ao Root diam: 3.50 cm Ao Asc diam:  3.00 cm MITRAL VALVE               TRICUSPID VALVE MV Area (PHT): 3.63 cm    TR Peak grad:   62.7 mmHg MV Decel Time: 209 msec    TR Vmax:        396.00 cm/s MV E velocity: 36.00 cm/s MV A velocity: 57.40 cm/s  SHUNTS MV E/A ratio:  0.63        Systemic VTI:  0.09 m                             Systemic Diam: 2.10 cm Chilton Si MD Electronically signed by Chilton Si MD Signature Date/Time: 07/11/2023/3:35:46 PM    Final    VAS [place] LOWER EXTREMITY VENOUS (DVT) (ONLY [facility] & [facility])  Result Date: 07/10/2023  Lower Venous DVT Study Patient Name:  Alexander [name]  Date of Exam:   07/10/2023 Medical Rec #: [id number]      Accession #:    [id number] Date of Birth: November 30, 1960      Patient Gender: M Patient Age:   21 years Exam Location:  [facility] Procedure:      VAS [place] LOWER EXTREMITY VENOUS (DVT) Referring Phys: Ernie Avena --------------------------------------------------------------------------------  Indications: Pain.  Risk Factors: None identified. Anticoagulation: Xarelto. Comparison Study: Novel PVD seen since previous exam Performing Technologist: Shona Simpson  Examination Guidelines: A complete evaluation includes B-mode imaging, spectral Doppler, color Doppler, and power Doppler as needed of all accessible portions of each vessel. Bilateral testing is considered an integral part of a complete examination. Limited examinations for reoccurring indications may be performed  as noted. The reflux portion of the exam is performed with the patient in reverse Trendelenburg.  +---------+---------------+---------+-----------+----------+-------------------+ RIGHT    CompressibilityPhasicitySpontaneityPropertiesThrombus Aging      +---------+---------------+---------+-----------+----------+-------------------+ CFV      Full           Yes      Yes                                      +---------+---------------+---------+-----------+----------+-------------------+ SFJ      Full                                                             +---------+---------------+---------+-----------+----------+-------------------+ FV Prox  Full                                                             +---------+---------------+---------+-----------+----------+-------------------+  FV Mid   Full                                                             +---------+---------------+---------+-----------+----------+-------------------+ FV DistalFull                                                             +---------+---------------+---------+-----------+----------+-------------------+ PFV      Full                                                             +---------+---------------+---------+-----------+----------+-------------------+ POP      Partial        Yes      Yes                  Age Indeterminate   +---------+---------------+---------+-----------+----------+-------------------+ PTV      Full                                                             +---------+---------------+---------+-----------+----------+-------------------+ PERO                                                  Not well visualized +---------+---------------+---------+-----------+----------+-------------------+   +---------+---------------+---------+-----------+----------+--------------+ LEFT     CompressibilityPhasicitySpontaneityPropertiesThrombus Aging +---------+---------------+---------+-----------+----------+--------------+ CFV  Full           Yes      Yes                                 +---------+---------------+---------+-----------+----------+--------------+ SFJ      Full                                                        +---------+---------------+---------+-----------+----------+--------------+ FV Prox  Full                                                        +---------+---------------+---------+-----------+----------+--------------+ FV Mid   Full                                                        +---------+---------------+---------+-----------+----------+--------------+ FV DistalFull                                                         +---------+---------------+---------+-----------+----------+--------------+ PFV      Full                                                        +---------+---------------+---------+-----------+----------+--------------+ POP      Full           Yes      Yes                                 +---------+---------------+---------+-----------+----------+--------------+ PTV      Full                                                        +---------+---------------+---------+-----------+----------+--------------+ PERO     Full                                                        +---------+---------------+---------+-----------+----------+--------------+     Summary: RIGHT: - Findings consistent with age indeterminate deep vein thrombosis involving the right popliteal vein.  - No cystic structure found in the popliteal fossa. - Peroneal and posterior tibial veins obstructed by calcified posterior tibial artery - Ultrasound characteristics of enlarged lymph nodes are noted in the groin.  LEFT: - There is no evidence  of deep vein thrombosis in the lower extremity.  - No cystic structure found in the popliteal fossa. - Peroneal and posterior tibial veins obstructed by calcified posterior tibial artery - Ultrasound characteristics of enlarged lymph nodes noted in the groin.  *See table(s) above for measurements and observations. Electronically signed by Coral Else MD on 07/10/2023 at 9:05:41 PM.    Final    DG Chest Portable 1 View  Result Date: 07/10/2023 CLINICAL DATA:  Dyspnea EXAM: PORTABLE CHEST 1 VIEW COMPARISON:  None Available. FINDINGS: Lungs are clear. No pneumothorax or pleural effusion. Cardiac size within normal limits. Pulmonary vascularity is normal. Healed right rib fractures. No acute bone abnormality. IMPRESSION: 1. No active disease. Electronically Signed   By: Helyn Numbers M.D.   On: 07/10/2023 19:35   CT Angio Chest PE W and/or Wo Contrast  Result Date:  07/10/2023 CLINICAL DATA:  High probability pulmonary embolism, dyspnea EXAM: CT ANGIOGRAPHY CHEST WITH CONTRAST TECHNIQUE: Multidetector CT imaging of the chest was performed using the standard protocol during bolus administration of intravenous contrast. Multiplanar CT image reconstructions and MIPs were obtained to evaluate the vascular anatomy. RADIATION DOSE REDUCTION: This exam was performed according to the departmental dose-optimization program which includes automated exposure control, adjustment of the mA and/or kV according to patient size and/or use of iterative reconstruction technique. CONTRAST:  75mL OMNIPAQUE IOHEXOL 350 MG/ML SOLN COMPARISON:  None Available. FINDINGS: Cardiovascular: There is adequate opacification the pulmonary arterial tree. There is extensive branching intraluminal filling defect within the lobar and segmental pulmonary arteries of the lungs bilaterally in keeping with acute pulmonary embolism. The embolic burden is large. The central pulmonary arteries are enlarged. There is inversion of the normal RV/LV ratio (RV/LV = 1.65) in keeping with changes of right heart strain. Minimal coronary artery calcification. Global cardiac size within normal limits. No pericardial effusion. The thoracic aorta is unremarkable. Mediastinum/Nodes: No enlarged mediastinal, hilar, or axillary lymph nodes. Thyroid gland, trachea, and esophagus demonstrate no significant findings. Lungs/Pleura: Patchy asymmetric infiltrate within the posterior basal left lower lobe may represent atelectasis or developing infarct. Mild asymmetric right apical scarring. Lungs are otherwise clear. No pneumothorax or pleural effusion. No central obstructing lesion. Upper Abdomen: No acute abnormality. Musculoskeletal: Multiple healed right rib fractures noted. No acute bone abnormality. No lytic or blastic bone lesion. Review of the MIP images confirms the above findings. IMPRESSION: 1. Acute pulmonary embolism with  large embolic burden and CT evidence of right heart strain (RV/LV Ratio = 1.65) consistent with at least submassive (intermediate risk) PE. The presence of right heart strain has been associated with an increased risk of morbidity and mortality. Please refer to the "Code PE Focused" order set in EPIC. These results were called by telephone at the time of interpretation on 07/10/2023 at 7:34 pm to provider [facility] , who verbally acknowledged these results. Electronically Signed   By: Helyn Numbers M.D.   On: 07/10/2023 19:34    Labs:  CBC: Recent Labs    07/11/23 0314 07/12/23 0308 07/12/23 2339 07/13/23 0628  WBC 8.2 8.2 8.3 8.8  HGB 11.3* 11.6* 10.7* 10.6*  HCT 35.6* 36.5* 34.1* 33.5*  PLT 182 179 171 119*    COAGS: No results for input(s): "INR", "APTT" in the last 8760 hours.  BMP: Recent Labs    07/10/23 1749 07/11/23 0314  NA 136 136  K 3.9 3.6  CL 106 105  CO2 21* 23  GLUCOSE 106* 147*  BUN 13 14  CALCIUM 8.7*  8.5*  CREATININE 0.76 0.92  GFRNONAA >60 >60    LIVER FUNCTION TESTS: No results for input(s): "BILITOT", "AST", "ALT", "ALKPHOS", "PROT", "ALBUMIN" in the last 8760 hours.  Assessment and Plan:  62 y.o. male with bilateral  PE s/p catheter directed lysis initiated 07/12/23 by Dr. Archer Asa.   Patient appears stable but still requiring 5L HFNC, sat 97%.  Hgb stable, no BMP today.  PE catheter and sheath will be removed by IR team today.  Patient OK to have diet from IR stand point, MD/RN notified.   Further treatment plan per PCCM. Please call IR for questions and concerns.    Electronically Signed: Willette Brace, PA-C 07/13/2023, 9:09 AM   I spent a total of 15 Minutes at the the patient's bedside AND on the patient's hospital floor or unit, greater than 50% of which was counseling/coordinating care for PE lysis f/u.   This chart was dictated using voice recognition software.  Despite best efforts to proofread,  errors can occur which can  change the documentation meaning.

## 2023-07-13 NOTE — Progress Notes (Signed)
PHARMACY - ANTICOAGULATION CONSULT NOTE  Pharmacy Consult for heparin Indication:  VTE treatment  No Known Allergies  Patient Measurements: Height: 5\' 9"  (175.3 cm) Weight: 86.2 kg (190 lb) IBW/kg (Calculated) : 70.7 Heparin Dosing Weight: 86.2 kg  Vital Signs: Temp: 99.2 F (37.3 C) (10/12 1651) Temp Source: Oral (10/12 1651) BP: 126/67 (10/12 1923) Pulse Rate: 83 (10/12 1923)  Labs: Recent Labs    07/11/23 0314 07/11/23 1045 07/13/23 0628 07/13/23 1148 07/13/23 1907  HGB 11.3*   < > 10.6* 10.3* 11.6*  HCT 35.6*   < > 33.5* 32.6* 37.4*  PLT 182   < > 119* 132* 147*  HEPARINUNFRC 0.27*   < > 0.40 0.25* 0.32  CREATININE 0.92  --   --   --   --    < > = values in this interval not displayed.    Estimated Creatinine Clearance: 91.7 mL/min (by C-G formula based on SCr of 0.92 mg/dL).    Assessment: 62 year old male presented with chest pain and shortness of breath, as well as left leg pain from hip down to knee. 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 Per notes, it looks like patient stopped taking this medication himself. Last notes from primary care from January 2023.  CTA chest positive acute PE with large embolic burden and evidence of right heart strain consistent with at least submassive PE. Lower extremity ultrasound consistent with age indeterminate DVT involving the right popliteal vein.  10/11: Patient went to IR ~6:22 pm for catheter directed thrombolysis. He is now s/p tPA x 12 hrs.  07/13/23 Heparin level 0.32, remains therapeutic on heparin infusion at 1800 units/hr CBC: Hgb/plt stable Earlier today, RN reports oozing a moderate amount from his PIV site and coughing up some bloody sputum; MD aware Continues to have bleeding from IV site requiring dressing changes throughout shift, also still coughing blood - discussed pt with covering TRH, will continue as is given need for anticoagulation   Goal of Therapy:  Heparin level 0.3-0.7  units/ml Monitor platelets by anticoagulation protocol: Yes   Plan:  Continue heparin infusion at 1800 units/hr Daily CBC and heparin level Continue to monitor closely for any new or worsening of current bleeding F/u plans for long term anticoagulation: 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  Pricilla Riffle, PharmD, BCPS Clinical Pharmacist 07/13/2023 7:42 PM

## 2023-07-13 NOTE — Plan of Care (Signed)
  Problem: Clinical Measurements: Goal: Diagnostic test results will improve Outcome: Progressing Goal: Respiratory complications will improve Outcome: Progressing Goal: Cardiovascular complication will be avoided Outcome: Progressing   Problem: Activity: Goal: Risk for activity intolerance will decrease Outcome: Progressing   Problem: Nutrition: Goal: Adequate nutrition will be maintained Outcome: Progressing   Problem: Pain Managment: Goal: General experience of comfort will improve Outcome: Progressing   Problem: Skin Integrity: Goal: Risk for impaired skin integrity will decrease Outcome: Progressing

## 2023-07-13 NOTE — Progress Notes (Signed)
PHARMACY - ANTICOAGULATION CONSULT NOTE  Pharmacy Consult for heparin Indication:  VTE treatment  No Known Allergies  Patient Measurements: Height: 5\' 9"  (175.3 cm) Weight: 86.2 kg (190 lb) IBW/kg (Calculated) : 70.7 Heparin Dosing Weight: 86.2 kg  Vital Signs: Temp: 99.6 F (37.6 C) (10/12 0408) Temp Source: Oral (10/12 0408) BP: 112/66 (10/12 0700) Pulse Rate: 87 (10/12 0700)  Labs: Recent Labs    07/10/23 1749 07/10/23 1855 07/11/23 0314 07/11/23 1045 07/12/23 0308 07/12/23 1451 07/12/23 2339 07/13/23 0628  HGB 12.8*  --  11.3*  --  11.6*  --  10.7* 10.6*  HCT 40.6  --  35.6*  --  36.5*  --  34.1* 33.5*  PLT 205  --  182  --  179  --  171 119*  HEPARINUNFRC  --   --  0.27*   < > 0.34 0.37 0.38 0.40  CREATININE 0.76  --  0.92  --   --   --   --   --   TROPONINIHS 36* 42*  --   --   --   --   --   --    < > = values in this interval not displayed.    Estimated Creatinine Clearance: 91.7 mL/min (by C-G formula based on SCr of 0.92 mg/dL).    Assessment: 62 year old male presented with chest pain and shortness of breath, as well as left leg pain from hip down to knee. 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 Per notes, it looks like patient stopped taking this medication himself. Last notes from primary care from January 2023.  CTA chest positive acute PE with large embolic burden and evidence of right heart strain consistent with at least submassive PE. Lower extremity ultrasound consistent with age indeterminate DVT involving the right popliteal vein.  10/11: Patient went to IR ~6:22pm for catheter directed thrombolysis. tPA 21 mg x 2 started 1830 with plan to infuse for 12 hrs.   07/13/23 0628 Heparin level 0.4, therapeutic on IV heparin 1800 units/hr CBC: Hgb mildly low/stable; Pltc low/dropped to 119 Fibrinogen 598.  tPA infusion is complete. RN reports bleeding when blows nose & now at peripheral IV site.   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  Junita Push, PharmD, BCPS 07/13/2023 7:08 AM

## 2023-07-13 NOTE — Plan of Care (Signed)
  Problem: Clinical Measurements: Goal: Ability to maintain clinical measurements within normal limits will improve Outcome: Progressing   Problem: Nutrition: Goal: Adequate nutrition will be maintained Outcome: Progressing   Problem: Skin Integrity: Goal: Risk for impaired skin integrity will decrease Outcome: Progressing   Problem: Health Behavior/Discharge Planning: Goal: Ability to manage health-related needs will improve Outcome: Not Progressing   Problem: Safety: Goal: Ability to remain free from injury will improve Outcome: Not Progressing
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
## 2023-07-13 NOTE — Progress Notes (Addendum)
Pharmacy: [facility]   Patient is a 62 y.o M with hx PE/DVT (not on [facility] PTA) who presented to the ED on 07/10/23 with c/o of SOB. Chest CT on 10/9 showed acute PE with large embolic burden with evidence of RHS (at least submassive). LE doppler showed, "age indeterminate deep vein thrombosis involving the right popliteal vein."  He's currently on heparin drip for VTE treatment.   Patient went to IR ~6:22pm on 10/11 for catheter directed thrombolysis. tPA 21 mg x 2 started 1830 with plan to infuse for 12 hrs.  - heparin level q6h, cbc q6h and fibrinogen q6h per MD - heparin level goal be 0.3-0.7 while on cath directed tPA infusion.  07/13/2023: - heparin level collected at 0002 is within therapeutic range with 0.38 - no bleeding concerns reported by bedside RN - cbc: Hg 10.7 (low, decreased), pltc 171 WNL/stable - Fibrinogen 623  Goal of Therapy:  Heparin level 0.3-0.7 units/ml Monitor platelets by anticoagulation protocol: Yes  Plan: - increase heparin drip slightly to 1800 units/hr - f/u with AM labs and adjust as needed  - monitor for s/sx bleeding   Junita Push, PharmD, BCPS 07/13/2023 12:31 AM ____________________________________________

## 2023-07-13 NOTE — Progress Notes (Signed)
  Progress Note   Patient: Alexander [name] ZOX:[id number] DOB: Sep 17, 1961 DOA: 07/10/2023     3 DOS: the patient was seen and examined on 07/13/2023   Brief hospital course: 62 year old man PMH DVT, PE, not on anticoagulation (self discontinued) presenting with chest pain.  Found to have large clot burden on CTA chest was sign suggestive of right heart failure.  EDP consulted with pulmonary medicine who recommended against lytics.  Admitted to stepdown unit for IV heparin and further evaluation. Ongoing chest pain, tachycardia and hypoxia.  Seen by pulmonology, after discussion with interventional radiology, catheter directed thrombolytics were pursued.  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 Large clot burden on CTA with RHS on CT.  Echo low normal LVEF 50-55%, no WMA, grade I DD. RV systolic function mildly reduced.  Severe pulmonary hypertension. Seen by pulmonology and interventional radiology.  Catheter directed lytics initiated 10/11.  Will need lifelong anticoagulation, likely warfarin based on cost.  Outpatient hypercoagulability workup.  Suggest outpatient sleep study. Repeat echo in 6 months to see if there is any improvement    DVT (deep venous thrombosis) (HCC) R popliteal DVT indeterminate.  Patient's had a clot there in the past.  Continue IV heparin.  Thrombocytopenia Plts 171 > 119 Trend CBC   Essential hypertension, benign Hold home BP meds in pt with acute PE with cor pulmonale / RHS.      Subjective:  Tmax 101.4 Tachycardia resolved  Feels ok Less chest pain Breathing ok  Physical Exam: Vitals:   07/13/23 0500 07/13/23 0600  07/13/23 0700 07/13/23 0800  BP: 132/88 112/70 112/66 (!) 120/90  Pulse: 93 90 87 82  Resp: (!) 23 18  18   Temp:    98.1 F (36.7 C)  TempSrc:    Oral  SpO2: 96% 93% 96% 97%  Weight:      Height:       Physical Exam Vitals reviewed.  Constitutional:      General: He is not in acute distress.    Appearance: He is not ill-appearing or toxic-appearing.  Cardiovascular:     Rate and Rhythm: Normal rate and regular rhythm.     Heart sounds: No murmur heard.    Comments: Telemetry SR Pulmonary:     Effort: Pulmonary effort is normal. No respiratory distress.     Breath sounds: No wheezing, rhonchi or rales.  Neurological:     Mental Status: He is alert.  Psychiatric:        Mood and Affect: Mood normal.        Behavior: Behavior normal.     Data Reviewed: Hgb stable 10.6 Plts 171 > 119  Family Communication: none  Disposition: Status is: Inpatient Remains inpatient appropriate because: bilateral PE, hypoxia, s/p catheter-directed lytics     Time spent: 20 minutes  Author: Brendia Sacks, MD 07/13/2023 8:46 AM  For on call review [email or web address].
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
## 2023-07-14 ENCOUNTER — Telehealth: Payer: Self-pay | Admitting: Emergency Medicine

## 2023-07-14 DIAGNOSIS — I82431 Acute embolism and thrombosis of right popliteal vein: Secondary | ICD-10-CM | POA: Diagnosis not present

## 2023-07-14 DIAGNOSIS — I2609 Other pulmonary embolism with acute cor pulmonale: Secondary | ICD-10-CM | POA: Diagnosis not present

## 2023-07-14 DIAGNOSIS — I82451 Acute embolism and thrombosis of right peroneal vein: Secondary | ICD-10-CM | POA: Diagnosis not present

## 2023-07-14 DIAGNOSIS — D696 Thrombocytopenia, unspecified: Secondary | ICD-10-CM | POA: Diagnosis not present

## 2023-07-14 LAB — PROTIME-INR
INR: 1 (ref 0.8–1.2)
Prothrombin Time: 13.2 s (ref 11.4–15.2)

## 2023-07-14 LAB — CBC
HCT: 31.3 % — ABNORMAL LOW (ref 39.0–52.0)
Hemoglobin: 9.9 g/dL — ABNORMAL LOW (ref 13.0–17.0)
MCH: 29.1 pg (ref 26.0–34.0)
MCHC: 31.6 g/dL (ref 30.0–36.0)
MCV: 92.1 fL (ref 80.0–100.0)
Platelets: 137 10*3/uL — ABNORMAL LOW (ref 150–400)
RBC: 3.4 MIL/uL — ABNORMAL LOW (ref 4.22–5.81)
RDW: 17.9 % — ABNORMAL HIGH (ref 11.5–15.5)
WBC: 6.1 10*3/uL (ref 4.0–10.5)
nRBC: 0 % (ref 0.0–0.2)

## 2023-07-14 LAB — HEPARIN LEVEL (UNFRACTIONATED): Heparin Unfractionated: 0.34 [IU]/mL (ref 0.30–0.70)

## 2023-07-14 MED ORDER — WARFARIN - PHARMACIST DOSING INPATIENT: Freq: Every day | Status: DC

## 2023-07-14 MED ORDER — POLYETHYLENE GLYCOL 3350 17 G PO PACK
17.0000 g | PACK | Freq: Two times a day (BID) | ORAL | Status: DC
  Administered 2023-07-14 – 2023-07-18 (×4): 17 g via ORAL
  Filled 2023-07-14 (×11): qty 1

## 2023-07-14 MED ORDER — SENNA 8.6 MG PO TABS
1.0000 | ORAL_TABLET | Freq: Every day | ORAL | Status: DC
  Filled 2023-07-14 (×3): qty 1

## 2023-07-14 MED ORDER — ENOXAPARIN SODIUM 100 MG/ML IJ SOSY
90.0000 mg | PREFILLED_SYRINGE | Freq: Two times a day (BID) | INTRAMUSCULAR | Status: DC
  Administered 2023-07-14 – 2023-07-22 (×18): 90 mg via SUBCUTANEOUS
  Filled 2023-07-14 (×18): qty 1

## 2023-07-14 MED ORDER — ORAL CARE MOUTH RINSE: 15.0000 mL | OROMUCOSAL | Status: DC | PRN

## 2023-07-14 MED ORDER — WARFARIN SODIUM 5 MG PO TABS
7.5000 mg | ORAL_TABLET | Freq: Once | ORAL | Status: AC
  Administered 2023-07-14: 7.5 mg via ORAL
  Filled 2023-07-14: qty 1

## 2023-07-14 NOTE — Progress Notes (Signed)
  Progress Note   Patient: Alexander [name] DGL:[id number] DOB: 1961/02/06 DOA: 07/10/2023     4 DOS: the patient was seen and examined on 07/14/2023   Brief hospital course: 62 year old man PMH DVT, PE, not on anticoagulation (self discontinued) presenting with chest pain.  Found to have large clot burden on CTA chest was sign suggestive of right heart failure.  EDP consulted with pulmonary medicine who recommended against lytics.  Admitted to stepdown unit for IV heparin and further evaluation. Ongoing chest pain, tachycardia and hypoxia.  Seen by pulmonology, after discussion with interventional radiology, catheter directed thrombolytics were pursued.  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 Large clot burden on CTA with RHS on CT.  Echo low normal LVEF 50-55%, no WMA, grade I DD. RV systolic function mildly reduced.  Severe pulmonary hypertension. Seen by pulmonology and interventional radiology.  Catheter directed lytics initiated 10/11.  Will need lifelong anticoagulation, warfarin based on cost.  Outpatient hypercoagulability workup.  Suggest outpatient sleep study. Repeat echo in 6 months to see if there is any improvement  Follow-up with PCCM as an outpatient Overall better, down to 1.5 L nasal cannula, less symptomatic.  Transferred to progressive. Started [place] apparent bridge, warfarin per pharmacy   DVT (deep venous thrombosis) (HCC) R popliteal DVT indeterminate.  Patient's had a clot there in the past.   Warfarin, enoxaparin as above   Thrombocytopenia Normocytic anemia  Plts 132 > 147 > 137, stable Hgb 10.3 > 11.6 > 9.9, stable Trend CBC    Essential hypertension, benign Hold home BP meds in pt with acute PE with cor pulmonale / RHS.  Working towards discharge now, hopefully next 48 hours    Subjective:  Feels better Less CP Breathing better  Physical Exam: Vitals:   07/14/23 0325 07/14/23 0400 07/14/23 0500 07/14/23 0735  BP:  128/78 (!) 121/98   Pulse: 69 71 67   Resp: 17 19 15    Temp: 98.2 F (36.8 C)   98.3 F (36.8 C)  TempSrc: Oral   Oral  SpO2: 99% 96% 98%   Weight:      Height:       Physical Exam Vitals reviewed.  Constitutional:      General: He is not in acute distress.    Appearance: He is not ill-appearing or toxic-appearing.  Cardiovascular:     Rate and Rhythm: Normal rate and regular rhythm.     Heart sounds: No murmur heard.    Comments: Telemetry SR Pulmonary:     Effort: Pulmonary effort is normal. No respiratory distress.     Breath sounds: No wheezing, rhonchi or rales.  Neurological:     Mental Status: He is alert.  Psychiatric:        Mood and Affect: Mood normal.        Behavior: Behavior normal.     Data Reviewed: Hgb stable 9.9 Plts stable 137  Family Communication: none  Disposition: Status is: Inpatient Remains inpatient appropriate because: acute PE     Time spent: 20 minutes  Author: Brendia Sacks, MD 07/14/2023 10:03 AM  For on call review [email or web address].
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
## 2023-07-14 NOTE — Telephone Encounter (Signed)
Patient needs nonurgent hospital follow-up OV with any provider.  Follow-up pulmonary embolism, pulmonary hypertension, question OSA.
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
## 2023-07-14 NOTE — Plan of Care (Signed)
  Problem: Education: Goal: Knowledge of General Education information will improve Description: Including pain rating scale, medication(s)/side effects and non-pharmacologic comfort measures Outcome: Progressing   Problem: Clinical Measurements: Goal: Ability to maintain clinical measurements within normal limits will improve Outcome: Progressing Goal: Respiratory complications will improve Outcome: Progressing   Problem: Health Behavior/Discharge Planning: Goal: Ability to manage health-related needs will improve Outcome: Not Progressing

## 2023-07-15 DIAGNOSIS — I82431 Acute embolism and thrombosis of right popliteal vein: Secondary | ICD-10-CM | POA: Diagnosis not present

## 2023-07-15 DIAGNOSIS — I749 Embolism and thrombosis of unspecified artery: Secondary | ICD-10-CM | POA: Diagnosis not present

## 2023-07-15 DIAGNOSIS — I2781 Cor pulmonale (chronic): Secondary | ICD-10-CM

## 2023-07-15 LAB — CBC
HCT: 31.6 % — ABNORMAL LOW (ref 39.0–52.0)
Hemoglobin: 10 g/dL — ABNORMAL LOW (ref 13.0–17.0)
MCH: 29.4 pg (ref 26.0–34.0)
MCHC: 31.6 g/dL (ref 30.0–36.0)
MCV: 92.9 fL (ref 80.0–100.0)
Platelets: 163 10*3/uL (ref 150–400)
RBC: 3.4 MIL/uL — ABNORMAL LOW (ref 4.22–5.81)
RDW: 17.8 % — ABNORMAL HIGH (ref 11.5–15.5)
WBC: 4.4 10*3/uL (ref 4.0–10.5)
nRBC: 0 % (ref 0.0–0.2)

## 2023-07-15 LAB — PROTIME-INR
INR: 1 (ref 0.8–1.2)
Prothrombin Time: 13.2 s (ref 11.4–15.2)

## 2023-07-15 MED ORDER — WARFARIN SODIUM 5 MG PO TABS
7.5000 mg | ORAL_TABLET | Freq: Once | ORAL | Status: AC
  Administered 2023-07-15: 7.5 mg via ORAL
  Filled 2023-07-15: qty 1

## 2023-07-15 MED ORDER — DIPHENHYDRAMINE HCL 25 MG PO CAPS
25.0000 mg | ORAL_CAPSULE | Freq: Four times a day (QID) | ORAL | Status: DC | PRN
  Administered 2023-07-15 – 2023-07-23 (×19): 25 mg via ORAL
  Filled 2023-07-15 (×19): qty 1

## 2023-07-15 NOTE — Plan of Care (Signed)

## 2023-07-15 NOTE — Discharge Instructions (Signed)
Information on my medicine - Coumadin   (Warfarin)  This medication education was reviewed with me or my healthcare representative as part of my discharge preparation.  The pharmacist that spoke with me during my hospital stay was: Farrel Demark, Student-PharmD  Why was Coumadin prescribed for you? Coumadin was prescribed for you because you have a blood clot or a medical condition that can cause an increased risk of forming blood clots. Blood clots can cause serious health problems by blocking the flow of blood to the heart, lung, or brain. Coumadin can prevent harmful blood clots from forming. As a reminder your indication for Coumadin is: Pulmonary Embolism treatment  What test will check on my response to Coumadin? While on Coumadin (warfarin) you will need to have an INR test regularly to ensure that your dose is keeping you in the desired range. The INR (international normalized ratio) number is calculated from the result of the laboratory test called prothrombin time (PT).  If an INR APPOINTMENT HAS NOT ALREADY BEEN MADE FOR YOU please schedule an appointment to have this lab work done by your health care provider within 7 days. Your INR goal is usually a number between:  2 to 3 or your provider may give you a more narrow range like 2-2.5.  Ask your health care provider during an office visit what your goal INR is.  What  do you need to  know  About  COUMADIN? Take Coumadin (warfarin) exactly as prescribed by your healthcare provider about the same time each day.  DO NOT stop taking without talking to the doctor who prescribed the medication.  Stopping without other blood clot prevention medication to take the place of Coumadin may increase your risk of developing a new clot or stroke.  Get refills before you run out.  What do you do if you miss a dose? If you miss a dose, take it as soon as you remember on the same day then continue your regularly scheduled regimen the next day.  Do not  take two doses of Coumadin at the same time.  Important Safety Information A possible side effect of Coumadin (Warfarin) is an increased risk of bleeding. You should call your healthcare provider right away if you experience any of the following: Bleeding from an injury or your nose that does not stop. Unusual colored urine (red or dark brown) or unusual colored stools (red or black). Unusual bruising for unknown reasons. A serious fall or if you hit your head (even if there is no bleeding).  Some foods or medicines interact with Coumadin (warfarin) and might alter your response to warfarin. To help avoid this: Eat a balanced diet, maintaining a consistent amount of Vitamin K. Notify your provider about major diet changes you plan to make. Avoid alcohol or limit your intake to 1 drink for women and 2 drinks for men per day. (1 drink is 5 oz. wine, 12 oz. beer, or 1.5 oz. liquor.)  Make sure that ANY health care provider who prescribes medication for you knows that you are taking Coumadin (warfarin).  Also make sure the healthcare provider who is monitoring your Coumadin knows when you have started a new medication including herbals and non-prescription products.  Coumadin (Warfarin)  Major Drug Interactions  Increased Warfarin Effect Decreased Warfarin Effect  Alcohol (large quantities) Antibiotics (esp. Septra/Bactrim, Flagyl, Cipro) Amiodarone (Cordarone) Aspirin (ASA) Cimetidine (Tagamet) Megestrol (Megace) NSAIDs (ibuprofen, naproxen, etc.) Piroxicam (Feldene) Propafenone (Rythmol SR) Propranolol (Inderal) Isoniazid (INH) Posaconazole (Noxafil) Barbiturates (  Phenobarbital) Carbamazepine (Tegretol) Chlordiazepoxide (Librium) Cholestyramine (Questran) Griseofulvin Oral Contraceptives Rifampin Sucralfate (Carafate) Vitamin K   Coumadin (Warfarin) Major Herbal Interactions  Increased Warfarin Effect Decreased Warfarin Effect  Garlic Ginseng Ginkgo biloba Coenzyme  Q10 Green tea St. John's wort    Coumadin (Warfarin) FOOD Interactions  Eat a consistent number of servings per week of foods HIGH in Vitamin K (1 serving =  cup)  Collards (cooked, or boiled & drained) Kale (cooked, or boiled & drained) Mustard greens (cooked, or boiled & drained) Parsley *serving size only =  cup Spinach (cooked, or boiled & drained) Swiss chard (cooked, or boiled & drained) Turnip greens (cooked, or boiled & drained)  Eat a consistent number of servings per week of foods MEDIUM-HIGH in Vitamin K (1 serving = 1 cup)  Asparagus (cooked, or boiled & drained) Broccoli (cooked, boiled & drained, or raw & chopped) Brussel sprouts (cooked, or boiled & drained) *serving size only =  cup Lettuce, raw (green leaf, endive, romaine) Spinach, raw Turnip greens, raw & chopped   These websites have more information on Coumadin (warfarin):  http://www.king-russell.com/; https://www.hines.net/;
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
## 2023-07-15 NOTE — Plan of Care (Signed)
Problem: Education: Goal: Knowledge of General Education information will improve Description: Including pain rating scale, medication(s)/side effects and non-pharmacologic comfort measures Outcome: Progressing   Problem: Clinical Measurements: Goal: Will remain free from infection Outcome: Progressing   Problem: Coping: Goal: Level of anxiety will decrease Outcome: Progressing

## 2023-07-16 ENCOUNTER — Telehealth: Payer: Self-pay

## 2023-07-16 DIAGNOSIS — D696 Thrombocytopenia, unspecified: Secondary | ICD-10-CM | POA: Diagnosis not present

## 2023-07-16 DIAGNOSIS — I749 Embolism and thrombosis of unspecified artery: Secondary | ICD-10-CM | POA: Diagnosis not present

## 2023-07-16 DIAGNOSIS — I2609 Other pulmonary embolism with acute cor pulmonale: Secondary | ICD-10-CM | POA: Diagnosis not present

## 2023-07-16 LAB — CBC
HCT: 31.6 % — ABNORMAL LOW (ref 39.0–52.0)
Hemoglobin: 9.9 g/dL — ABNORMAL LOW (ref 13.0–17.0)
MCH: 29.2 pg (ref 26.0–34.0)
MCHC: 31.3 g/dL (ref 30.0–36.0)
MCV: 93.2 fL (ref 80.0–100.0)
Platelets: 212 10*3/uL (ref 150–400)
RBC: 3.39 MIL/uL — ABNORMAL LOW (ref 4.22–5.81)
RDW: 17.7 % — ABNORMAL HIGH (ref 11.5–15.5)
WBC: 4.8 10*3/uL (ref 4.0–10.5)
nRBC: 0 % (ref 0.0–0.2)

## 2023-07-16 LAB — PROTIME-INR
INR: 1 (ref 0.8–1.2)
Prothrombin Time: 13.6 s (ref 11.4–15.2)

## 2023-07-16 LAB — CREATININE, SERUM
Creatinine, Ser: 0.88 mg/dL (ref 0.61–1.24)
GFR, Estimated: >60 mL/min (ref >60)

## 2023-07-16 MED ORDER — WARFARIN SODIUM 5 MG PO TABS
10.0000 mg | ORAL_TABLET | Freq: Once | ORAL | Status: AC
  Administered 2023-07-16: 10 mg via ORAL
  Filled 2023-07-16: qty 2

## 2023-07-16 MED ORDER — TRAZODONE HCL 50 MG PO TABS
50.0000 mg | ORAL_TABLET | Freq: Every evening | ORAL | Status: DC | PRN
  Administered 2023-07-16 – 2023-07-22 (×6): 50 mg via ORAL
  Filled 2023-07-16 (×6): qty 1

## 2023-07-16 NOTE — Progress Notes (Signed)
Pt educated on administration of lovenox injection.  Patient was able to perform return demonstration on self.

## 2023-07-16 NOTE — Plan of Care (Signed)
Problem: Education: Goal: Knowledge of General Education information will improve Description: Including pain rating scale, medication(s)/side effects and non-pharmacologic comfort measures Outcome: Progressing   Problem: Health Behavior/Discharge Planning: Goal: Ability to manage health-related needs will improve Outcome: Progressing   Problem: Clinical Measurements: Goal: Will remain free from infection Outcome: Progressing
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
## 2023-07-16 NOTE — Telephone Encounter (Signed)
Received call from [place] at [facility] requesting a coumadin clinic apt for pt. Pt had recent DVT and was started on warfarin and needs INR check in 3-4 days after d/c. Pt should be d/c tomorrow. Advised Alexander [name] pt needs a hospital f/u with PCP and gave her the main number to the clinic to make a hospital f/u.  Received a msg from office at [facility] that the pt was placed on the coumadin clinic schedule for next week but no hospital f/u was made with PCP.  Forwarding msg to PCP to request authorization to mange warfarin for this pt.   Today's hospital pharmacy note is below;  Patient has been treated with IV heparin since admission for PE. Started on warfarin on 10/13 and transitioning to a enoxaparin /warfarin bridge on 10/14.  baseline INR 1.0.   LFTs WNL (03/2022) No significant drug interactions with warfarin   07/15/23: Day #3 of enoxaparin / bridge INR remains subtherapeutic at 1 CBC stable, plts improved No reported bleeding   Goal of Therapy:  INR 2-3 Monitor platelets by anticoagulation protocol: Yes   Plan:  Give warfarin 10mg  PO x 1 Continue enoxaparin 90mg  [place] Q12h Monitor INR, CBC, s/s of bleed   After the 5 days of combination, can discontinue Lovenox when INR > 2 for 2 consecutive days   Enzo [name], PharmD, BCPS, BCIDP Clinical Pharmacist 07/16/2023 7:22 AM

## 2023-07-16 NOTE — Progress Notes (Signed)
Progress Note   Patient: Alexander [name] WGN:[id number] DOB: 1961/02/19 DOA: 07/10/2023     6 DOS: the patient was seen and examined on 07/16/2023   Brief hospital course: 62 year old man PMH DVT, PE, not on anticoagulation (self discontinued) presenting with chest pain.  Found to have large clot burden on CTA chest was sign suggestive of right heart failure.  EDP consulted with pulmonary medicine who recommended against lytics.  Admitted to stepdown unit for IV heparin and further evaluation. Ongoing chest pain, tachycardia and hypoxia.  Seen by pulmonology, after discussion with interventional radiology, was treated with catheter directed thrombolytics with dramatic improvement.  Postprocedure the patient has done well and has now been weaned off oxygen.  Unable to afford DOAC's.  Therefore started on warfarin with enoxaparin bridge.  Unclear whether he can afford Lovenox, may need to remain in hospital until INR therapeutic.  Close outpatient follow-up with warfarin clinic will be needed.  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 Large clot burden on CTA with RHS on CT.  Echo low normal LVEF 50-55%, no WMA, grade I DD. RV systolic function mildly reduced.  Severe pulmonary hypertension. Seen by pulmonology and interventional radiology.  Catheter directed lytics initiated 10/11.  Will need lifelong anticoagulation: warfarin based on cost.   Outpatient hypercoagulability workup.  Suggest outpatient sleep study. Repeat echo in 6 months to see if there is any improvement  Follow-up with PCCM as an outpatient Hypoxia resolved Continue enoxaparin bridge, warfarin per  pharmacy   DVT (deep venous thrombosis) (HCC) R popliteal DVT indeterminate.  History of clot in right leg. Warfarin, enoxaparin as above   Thrombocytopenia -- resolved Normocytic anemia --stable Plts 132 > 147 > 137 > 163 > 212 Hgb stable   Essential hypertension, benign Hold home BP meds in pt with acute PE with cor pulmonale / RHS.      Subjective:  Feels better Did give self enoxaparin injection once  Physical Exam: Vitals:   07/15/23 1403 07/15/23 2040 07/16/23 0255 07/16/23 1333  BP: 124/78 117/70 102/77 120/79  Pulse: 79 90 61 75  Resp: 18 18 18  (!) 22  Temp: 98.3 F (36.8 C) 98.5 F (36.9 C) 98.7 F (37.1 C) 98.5 F (36.9 C)  TempSrc: Oral Oral Oral Oral  SpO2: 97% 97% 97% 100%  Weight:      Height:       Physical Exam Vitals reviewed.  Constitutional:      General: He is not in acute distress.    Appearance: He is not ill-appearing or toxic-appearing.  Cardiovascular:     Rate and Rhythm: Normal rate and regular rhythm.     Heart sounds: No murmur heard. Pulmonary:     Effort: Pulmonary effort is normal. No respiratory distress.     Breath sounds: No wheezing, rhonchi or rales.  Neurological:     Mental Status: He is alert.  Psychiatric:        Mood and Affect: Mood normal.        Behavior: Behavior normal.     Data Reviewed: Hgb stable 9.9 INR WNL  Family Communication: none  Disposition: Status is: Inpatient Remains inpatient appropriate because: acute PE     Time spent: 20 minutes  Author: Reuel Boom  Irene Limbo, MD 07/16/2023 5:06 PM  For on call review [email or web address].

## 2023-07-16 NOTE — Progress Notes (Signed)
Mobility Specialist - Progress Note   07/16/23 1120  Mobility  Activity Ambulated independently in hallway  Level of Assistance Standby assist, set-up cues, supervision of patient - no hands on  Assistive Device None  Distance Ambulated (ft) 500 ft  Range of Motion/Exercises Active  Activity Response Tolerated well  Mobility Referral Yes  $Mobility charge 1 Mobility  Mobility Specialist Start Time (ACUTE ONLY) 1110  Mobility Specialist Stop Time (ACUTE ONLY) 1120  Mobility Specialist Time Calculation (min) (ACUTE ONLY) 10 min   Pt was found in bed and agreeable to ambulate. No complaints with session. At EOS returned to use bathroom. Was left with all needs met.  Billey Chang Mobility Specialist

## 2023-07-16 NOTE — Plan of Care (Signed)
  Problem: Education: Goal: Knowledge of General Education information will improve Description: Including pain rating scale, medication(s)/side effects and non-pharmacologic comfort measures Outcome: Progressing   Problem: Clinical Measurements: Goal: Diagnostic test results will improve Outcome: Progressing   Problem: Coping: Goal: Level of anxiety will decrease Outcome: Progressing   Problem: Pain Managment: Goal: General experience of comfort will improve Outcome: Progressing   

## 2023-07-16 NOTE — TOC Benefit Eligibility Note (Signed)
Transition of Care [facility]) Benefit Eligibility Note    Patient Details  Name: Alexander [name] MRN: [id number] Date of Birth: [birth year]   Medication/Dose: 90 mg Sq 12 hrs        Prescription Coverage Preferred Pharmacy: local  Spoke with Person/Company/Phone Number:: Ailene Ravel [facility] [phone]  Co-Pay: $586.90 for Enoxaparin, Lovenox not covered  Prior Approval: No  Deductible: Unmet  Additional Notes: Per Katharine/ [employer] [facility] Loraine Leriche [phone] Lovenox not covered Enoxaparin covered estimated copay $586.90 due to he has not met copay yet.    Caren Macadam Phone Number: 07/16/2023, 3:31 PM

## 2023-07-16 NOTE — TOC Initial Note (Signed)
Transition of Care [facility]) - Initial/Assessment Note    Patient Details  Name: Alexander [name] MRN: [id number] Date of Birth: [birth year]  Transition of [facility]) CM/SW Contact:    Lanier Clam, RN Phone Number: 07/16/2023, 3:35 PM  Clinical Narrative: I have set coumadin clinic appt 10/23 @ 9a,co pay $500 for lovenox.PCP list given.Dept of social service for disability.                  Expected Discharge Plan: Home/Self Care Barriers to Discharge: Continued Medical Work up   Patient Goals and CMS Choice Patient states their goals for this hospitalization and ongoing recovery are:: Home          Expected Discharge Plan and Services                                              Prior Living Arrangements/Services                       Activities of Daily Living   ADL Screening (condition at time of admission) Independently performs ADLs?: Yes (appropriate for developmental age) Is the patient deaf or have difficulty hearing?: No Does the patient have difficulty seeing, even when wearing glasses/contacts?: No Does the patient have difficulty concentrating, remembering, or making decisions?: No  Permission Sought/Granted                  Emotional Assessment              Admission diagnosis:  Acute deep vein thrombosis (DVT) of proximal vein of lower extremity, unspecified laterality (HCC) [I82.4Y9] Acute pulmonary embolism with acute cor pulmonale (HCC) [I26.09] Acute pulmonary embolism with acute cor pulmonale, unspecified pulmonary embolism type (HCC) [I26.09] Patient Active Problem List   Diagnosis Date Noted   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   Thrombocytopenia (HCC) 07/13/2023   Pulmonary hypertension (HCC) 07/12/2023   Cor pulmonale (HCC) 07/12/2023   Noncompliance 01/22/2020   Impaired fasting blood sugar 01/22/2020   Chronic diarrhea 01/22/2020   High risk medication use 10/30/2019   S/P surgical manipulation  of ankle joint 10/30/2019   Hyperlipidemia 10/30/2019   Hepatic steatosis 10/08/2019   Spinal stenosis 10/08/2019   Aortic atherosclerosis (HCC) 10/08/2019   Peripheral arterial disease (HCC) 10/08/2019   Sleep disturbance 10/08/2019   Diverticulosis 10/08/2019   Pulmonary embolus (HCC) 09/29/2019   Acute pulmonary embolism (HCC) 09/28/2019   DVT (deep venous thrombosis) (HCC) 09/28/2019   Essential hypertension, benign 09/28/2019   Gout 09/28/2019   Chronic pain of right ankle 12/12/2016   Chronic foot pain, right 11/02/2012   Elevated uric acid in blood 09/21/2011   Chronic GERD 09/21/2011   PCP:  Jarold Motto, PA Pharmacy[phone] [place] Otto, [place] - [address] [place] [address] Phone: [phone] Fax: [phone]     Social Determinants of Health (SDOH) Social History: SDOH Screenings   Food Insecurity: No Food Insecurity (07/10/2023)  Housing: Patient Declined (07/10/2023)  Transportation Needs: No Transportation Needs (07/10/2023)  Utilities: Not At Risk (07/10/2023)  Depression (PHQ2-9): Low Risk  (06/01/2021)  Tobacco Use: Medium Risk (07/10/2023)   SDOH Interventions:     Readmission Risk Interventions    07/11/2023    2:11 PM  Readmission Risk Prevention Plan  Post Dischage Appt Complete  Medication Screening Complete  Transportation Screening Complete

## 2023-07-17 DIAGNOSIS — I2609 Other pulmonary embolism with acute cor pulmonale: Secondary | ICD-10-CM | POA: Diagnosis not present

## 2023-07-17 DIAGNOSIS — I749 Embolism and thrombosis of unspecified artery: Secondary | ICD-10-CM | POA: Diagnosis not present

## 2023-07-17 DIAGNOSIS — I82431 Acute embolism and thrombosis of right popliteal vein: Secondary | ICD-10-CM | POA: Diagnosis not present

## 2023-07-17 DIAGNOSIS — I10 Essential (primary) hypertension: Secondary | ICD-10-CM | POA: Diagnosis not present

## 2023-07-17 LAB — CBC
HCT: 31.1 % — ABNORMAL LOW (ref 39.0–52.0)
Hemoglobin: 9.9 g/dL — ABNORMAL LOW (ref 13.0–17.0)
MCH: 29.1 pg (ref 26.0–34.0)
MCHC: 31.8 g/dL (ref 30.0–36.0)
MCV: 91.5 fL (ref 80.0–100.0)
Platelets: 226 10*3/uL (ref 150–400)
RBC: 3.4 MIL/uL — ABNORMAL LOW (ref 4.22–5.81)
RDW: 17.5 % — ABNORMAL HIGH (ref 11.5–15.5)
WBC: 4.6 10*3/uL (ref 4.0–10.5)
nRBC: 0 % (ref 0.0–0.2)

## 2023-07-17 LAB — COMPREHENSIVE METABOLIC PANEL
ALT: 22 U/L (ref 0–44)
AST: 31 U/L (ref 15–41)
Albumin: 2.8 g/dL — ABNORMAL LOW (ref 3.5–5.0)
Alkaline Phosphatase: 43 U/L (ref 38–126)
Anion gap: 9 (ref 5–15)
BUN: 14 mg/dL (ref 8–23)
CO2: 24 mmol/L (ref 22–32)
Calcium: 8.7 mg/dL — ABNORMAL LOW (ref 8.9–10.3)
Chloride: 107 mmol/L (ref 98–111)
Creatinine, Ser: 0.89 mg/dL (ref 0.61–1.24)
GFR, Estimated: >60 mL/min (ref >60)
Glucose, Bld: 88 mg/dL (ref 70–99)
Potassium: 4.1 mmol/L (ref 3.5–5.1)
Sodium: 140 mmol/L (ref 135–145)
Total Bilirubin: <0.1 mg/dL — ABNORMAL LOW (ref 0.3–1.2)
Total Protein: 6.5 g/dL (ref 6.5–8.1)

## 2023-07-17 LAB — PHOSPHORUS: Phosphorus: 4.5 mg/dL (ref 2.5–4.6)

## 2023-07-17 LAB — PROTIME-INR
INR: 1.1 (ref 0.8–1.2)
Prothrombin Time: 14.6 s (ref 11.4–15.2)

## 2023-07-17 LAB — MAGNESIUM: Magnesium: 2.1 mg/dL (ref 1.7–2.4)

## 2023-07-17 MED ORDER — WARFARIN SODIUM 5 MG PO TABS
10.0000 mg | ORAL_TABLET | Freq: Once | ORAL | Status: AC
  Administered 2023-07-17: 10 mg via ORAL
  Filled 2023-07-17: qty 2

## 2023-07-17 NOTE — Progress Notes (Signed)
Mobility Specialist - Progress Note   07/17/23 1101  Mobility  Activity Ambulated independently in hallway  Level of Assistance Independent  Assistive Device None  Distance Ambulated (ft) 500 ft  Activity Response Tolerated well  Mobility Referral Yes  $Mobility charge 1 Mobility  Mobility Specialist Start Time (ACUTE ONLY) 1055  Mobility Specialist Stop Time (ACUTE ONLY) 1101  Mobility Specialist Time Calculation (min) (ACUTE ONLY) 6 min   Pt received EOB and agreeable to mobility. No complaints during session. Pt to EOB after session with all needs met.    [facility]

## 2023-07-17 NOTE — Plan of Care (Signed)
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
## 2023-07-18 DIAGNOSIS — I2609 Other pulmonary embolism with acute cor pulmonale: Secondary | ICD-10-CM | POA: Diagnosis not present

## 2023-07-18 DIAGNOSIS — I82431 Acute embolism and thrombosis of right popliteal vein: Secondary | ICD-10-CM | POA: Diagnosis not present

## 2023-07-18 DIAGNOSIS — I10 Essential (primary) hypertension: Secondary | ICD-10-CM | POA: Diagnosis not present

## 2023-07-18 DIAGNOSIS — I749 Embolism and thrombosis of unspecified artery: Secondary | ICD-10-CM | POA: Diagnosis not present

## 2023-07-18 LAB — CBC
HCT: 32.1 % — ABNORMAL LOW (ref 39.0–52.0)
Hemoglobin: 10.2 g/dL — ABNORMAL LOW (ref 13.0–17.0)
MCH: 29.3 pg (ref 26.0–34.0)
MCHC: 31.8 g/dL (ref 30.0–36.0)
MCV: 92.2 fL (ref 80.0–100.0)
Platelets: 276 10*3/uL (ref 150–400)
RBC: 3.48 MIL/uL — ABNORMAL LOW (ref 4.22–5.81)
RDW: 17.7 % — ABNORMAL HIGH (ref 11.5–15.5)
WBC: 5.8 10*3/uL (ref 4.0–10.5)
nRBC: 0 % (ref 0.0–0.2)

## 2023-07-18 LAB — PROTIME-INR
INR: 1.4 — ABNORMAL HIGH (ref 0.8–1.2)
Prothrombin Time: 17.8 s — ABNORMAL HIGH (ref 11.4–15.2)

## 2023-07-18 MED ORDER — MELATONIN 3 MG PO TABS
3.0000 mg | ORAL_TABLET | Freq: Every day | ORAL | Status: DC
  Administered 2023-07-18 – 2023-07-22 (×5): 3 mg via ORAL
  Filled 2023-07-18 (×5): qty 1

## 2023-07-18 MED ORDER — DIPHENHYDRAMINE HCL 25 MG PO CAPS
25.0000 mg | ORAL_CAPSULE | Freq: Every evening | ORAL | Status: DC | PRN
  Administered 2023-07-18 – 2023-07-21 (×3): 25 mg via ORAL
  Filled 2023-07-18 (×3): qty 1

## 2023-07-18 MED ORDER — WARFARIN SODIUM 5 MG PO TABS
10.0000 mg | ORAL_TABLET | Freq: Once | ORAL | Status: AC
  Administered 2023-07-18: 10 mg via ORAL
  Filled 2023-07-18: qty 2

## 2023-07-18 NOTE — Plan of Care (Signed)
  Problem: Health Behavior/Discharge Planning: Goal: Ability to manage health-related needs will improve Outcome: Progressing   

## 2023-07-18 NOTE — Plan of Care (Signed)
  Problem: Clinical Measurements: Goal: Diagnostic test results will improve Outcome: Progressing   Problem: Pain Managment: Goal: General experience of comfort will improve Outcome: Progressing   Problem: Skin Integrity: Goal: Risk for impaired skin integrity will decrease Outcome: Progressing   Problem: Education: Goal: Knowledge of General Education information will improve Description: Including pain rating scale, medication(s)/side effects and non-pharmacologic comfort measures Outcome: Adequate for Discharge   Problem: Health Behavior/Discharge Planning: Goal: Ability to manage health-related needs will improve Outcome: Adequate for Discharge   Problem: Clinical Measurements: Goal: Ability to maintain clinical measurements within normal limits will improve Outcome: Adequate for Discharge Goal: Will remain free from infection Outcome: Adequate for Discharge Goal: Respiratory complications will improve Outcome: Adequate for Discharge Goal: Cardiovascular complication will be avoided Outcome: Adequate for Discharge   Problem: Activity: Goal: Risk for activity intolerance will decrease Outcome: Adequate for Discharge   Problem: Nutrition: Goal: Adequate nutrition will be maintained Outcome: Adequate for Discharge   Problem: Coping: Goal: Level of anxiety will decrease Outcome: Adequate for Discharge   Problem: Elimination: Goal: Will not experience complications related to bowel motility Outcome: Adequate for Discharge Goal: Will not experience complications related to urinary retention Outcome: Adequate for Discharge   Problem: Safety: Goal: Ability to remain free from injury will improve Outcome: Adequate for Discharge   Problem: Education: Goal: Understanding of post-operative needs will improve Outcome: Adequate for Discharge   Problem: Respiratory: Goal: Will regain and/or maintain adequate ventilation Outcome: Adequate for Discharge
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
## 2023-07-18 NOTE — Telephone Encounter (Signed)
Left patient vm to call back in regard to explaining the importance of keeping upcoming visits.  Also, wanted to schedule hospital fu with Sam.  If patient does not, he will need to schedule follow up with Cardiology.

## 2023-07-19 DIAGNOSIS — I82431 Acute embolism and thrombosis of right popliteal vein: Secondary | ICD-10-CM | POA: Diagnosis not present

## 2023-07-19 DIAGNOSIS — I2609 Other pulmonary embolism with acute cor pulmonale: Secondary | ICD-10-CM | POA: Diagnosis not present

## 2023-07-19 DIAGNOSIS — I749 Embolism and thrombosis of unspecified artery: Secondary | ICD-10-CM | POA: Diagnosis not present

## 2023-07-19 DIAGNOSIS — I10 Essential (primary) hypertension: Secondary | ICD-10-CM | POA: Diagnosis not present

## 2023-07-19 LAB — COMPREHENSIVE METABOLIC PANEL
ALT: 28 U/L (ref 0–44)
AST: 26 U/L (ref 15–41)
Albumin: 3 g/dL — ABNORMAL LOW (ref 3.5–5.0)
Alkaline Phosphatase: 48 U/L (ref 38–126)
Anion gap: 8 (ref 5–15)
BUN: 18 mg/dL (ref 8–23)
CO2: 23 mmol/L (ref 22–32)
Calcium: 8.5 mg/dL — ABNORMAL LOW (ref 8.9–10.3)
Chloride: 105 mmol/L (ref 98–111)
Creatinine, Ser: 0.78 mg/dL (ref 0.61–1.24)
GFR, Estimated: >60 mL/min (ref >60)
Glucose, Bld: 96 mg/dL (ref 70–99)
Potassium: 4 mmol/L (ref 3.5–5.1)
Sodium: 136 mmol/L (ref 135–145)
Total Bilirubin: 0.3 mg/dL (ref 0.3–1.2)
Total Protein: 6.8 g/dL (ref 6.5–8.1)

## 2023-07-19 LAB — CBC WITH DIFFERENTIAL/PLATELET
Abs Immature Granulocytes: 0.03 10*3/uL (ref 0.00–0.07)
Basophils Absolute: 0 10*3/uL (ref 0.0–0.1)
Basophils Relative: 1 %
Eosinophils Absolute: 1 10*3/uL — ABNORMAL HIGH (ref 0.0–0.5)
Eosinophils Relative: 16 %
HCT: 31.8 % — ABNORMAL LOW (ref 39.0–52.0)
Hemoglobin: 10.1 g/dL — ABNORMAL LOW (ref 13.0–17.0)
Immature Granulocytes: 1 %
Lymphocytes Relative: 28 %
Lymphs Abs: 1.7 10*3/uL (ref 0.7–4.0)
MCH: 29.4 pg (ref 26.0–34.0)
MCHC: 31.8 g/dL (ref 30.0–36.0)
MCV: 92.7 fL (ref 80.0–100.0)
Monocytes Absolute: 0.7 10*3/uL (ref 0.1–1.0)
Monocytes Relative: 11 %
Neutro Abs: 2.7 10*3/uL (ref 1.7–7.7)
Neutrophils Relative %: 43 %
Platelets: 324 10*3/uL (ref 150–400)
RBC: 3.43 MIL/uL — ABNORMAL LOW (ref 4.22–5.81)
RDW: 17.5 % — ABNORMAL HIGH (ref 11.5–15.5)
WBC: 6.2 10*3/uL (ref 4.0–10.5)
nRBC: 0 % (ref 0.0–0.2)

## 2023-07-19 LAB — IRON AND TIBC
Iron: 25 ug/dL — ABNORMAL LOW (ref 45–182)
Saturation Ratios: 8 % — ABNORMAL LOW (ref 17.9–39.5)
TIBC: 328 ug/dL (ref 250–450)
UIBC: 303 ug/dL

## 2023-07-19 LAB — VITAMIN B12: Vitamin B-12: 222 pg/mL (ref 180–914)

## 2023-07-19 LAB — RETICULOCYTES
Immature Retic Fract: 33.5 % — ABNORMAL HIGH (ref 2.3–15.9)
RBC.: 3.47 MIL/uL — ABNORMAL LOW (ref 4.22–5.81)
Retic Count, Absolute: 48.6 10*3/uL (ref 19.0–186.0)
Retic Ct Pct: 1.4 % (ref 0.4–3.1)

## 2023-07-19 LAB — PHOSPHORUS: Phosphorus: 4.4 mg/dL (ref 2.5–4.6)

## 2023-07-19 LAB — FERRITIN: Ferritin: 25 ng/mL (ref 24–336)

## 2023-07-19 LAB — PROTIME-INR
INR: 1.6 — ABNORMAL HIGH (ref 0.8–1.2)
Prothrombin Time: 19.1 s — ABNORMAL HIGH (ref 11.4–15.2)

## 2023-07-19 LAB — FOLATE: Folate: 7.5 ng/mL (ref >5.9)

## 2023-07-19 LAB — MAGNESIUM: Magnesium: 2 mg/dL (ref 1.7–2.4)

## 2023-07-19 MED ORDER — WARFARIN SODIUM 5 MG PO TABS
10.0000 mg | ORAL_TABLET | Freq: Once | ORAL | Status: AC
  Administered 2023-07-19: 10 mg via ORAL
  Filled 2023-07-19: qty 2

## 2023-07-19 NOTE — Progress Notes (Signed)
PROGRESS NOTE    Alexander [name]  WGN:[id number] DOB: 08-11-1961 DOA: 07/10/2023 PCP: Jarold Motto, PA   Brief Narrative:  The patient is a 62 year old AAM with a PMH significant for but not limited to hx of DVT, PE, not on anticoagulation (self discontinued) presenting with chest pain.  Found to have large clot burden on CTA chest was sign suggestive of right heart failure.  EDP consulted with pulmonary medicine who recommended against lytics.  Admitted to stepdown unit for IV heparin and further evaluation. Due to ongoing chest pain, tachycardia and hypoxia he was  Seen by pulmonology, after discussion with interventional radiology, was treated with catheter directed thrombolytics with dramatic improvement.  Postprocedure the patient has done well and has now been weaned off oxygen.  Unable to afford DOAC's.  Therefore started on warfarin with enoxaparin bridge.  Since he cannot afford Lovenox , he will need to remain in hospital until INR therapeutic.  He will need Close outpatient follow-up with Warfarin clinic at D/C.   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 -Large clot burden on CTA with RHS on CT.  -Echo low normal LVEF 50-55%, no WMA, grade I DD. RV systolic function mildly reduced.  Severe pulmonary hypertension. -Seen by Pulmonology and Interventional Radiology.  Catheter directed lytics initiated 10/11.  Will need lifelong anticoagulation: warfarin based on cost.   -Will need Outpatient hypercoagulability workup.  Suggest outpatient sleep study. -Repeat echo in 3 months to see if there is any improvement  -Follow-up with PCCM as an outpatient -Hypoxia resolved -Continue enoxaparin bridge, warfarin per pharmacy for  now but enoxaparin injections may be too prohibitive expensive so we will need to remain inpatient until INR therapeutic; Last PT-INR was 19.1-1.6 Respectively    DVT (deep venous thrombosis) (HCC) -VTE Schan showing R popliteal DVT indeterminate and no DVT on Left.  History of clot in right leg. -C/w Warfarin with Enoxaparin bridge as above  Normocytic Anemia -Hgb/Hct relatively Stable now -Hgb/Hct Trend: Recent Labs  Lab 07/13/23 1907 07/14/23 0331 07/15/23 0438 07/16/23 0506 07/17/23 0505 07/18/23 0450 07/19/23 0448  HGB 11.6* 9.9* 10.0* 9.9* 9.9* 10.2* 10.1*  HCT 37.4* 31.3* 31.6* 31.6* 31.1* 32.1* 31.8*  MCV 94.7 92.1 92.9 93.2 91.5 92.2 92.7  -Checked Anemia Panel and showed and Iron Level of 25, UIBC was 303, TIBC was 328, Saturation Ratios of 8%, Ferritin of 25, Folate of 7.5, and Vitamin B12 of 222 -Continue to Monitor for S/Sx of Bleeding; No overt bleeding noted -Repeat CBC in the AM  Thrombocytopenia, resolved -Platelet Count Trend: Recent Labs  Lab 07/13/23 1907 07/14/23 0331 07/15/23 0438 07/16/23 0506 07/17/23 0505 07/18/23 0450 07/19/23 0448  PLT 147* 137* 163 212 226 276 324  -Continue to Monitor for S/Sx of Bleeding; no overt bleeding noted -Repeat CBC in the AM  Hypoalbuminemia -Patient's Albumin Trend: Recent Labs  Lab 07/17/23 0505 07/19/23 0448  ALBUMIN 2.8* 3.0*  -Continue to Monitor and Trend and repeat CMP in the AM  Insomnia -C/w Trazodone 50 mg po at bedtime prn Sleep and also add Diphenhydramine 25 mg po at bedtime prn Sleep if Trazodone is ineffective -Also add Melatonin tab 3 mg po qHS   DVT prophylaxis:     Code Status: Full Code Family Communication: Anticoagulated with Coumadin and Lovenox  Bridge  Disposition Plan:  Level of care: Med-Surg Status is: Inpatient Remains inpatient appropriate because: Needs to ensure that his INR is therapeutic prior to D/C    Consultants:  PCCM/Pumonary Interventional  Radiology  Procedures:  10/11 Bilateral pulmonary angiogram and initiation of PE lysis   Antimicrobials:  Anti-infectives (From admission, onward)    None       Subjective: Seen and examined at bedside and he was doing fairly well. States he slept a little bit better last night but not by much. Had no CP or SOB. No other concerns or complaints at this time.   Objective: Vitals:   07/18/23 0521 07/18/23 1204 07/18/23 2013 07/19/23 0521  BP: 132/86 123/70 119/78 131/87  Pulse: 74 79 87 69  Resp: 18 19 18 18   Temp: 98.4 F (36.9 C) 98 F (36.7 C) 97.7 F (36.5 C) 98.1 F (36.7 C)  TempSrc: Oral Oral Oral Oral  SpO2: 100% 97% 98% 100%  Weight:      Height:        Intake/Output Summary (Last 24 hours) at 07/19/2023 0848 Last data filed at 07/18/2023 1700 Gross per 24 hour  Intake 360 ml  Output --  Net 360 ml   Filed Weights   07/10/23 1145  Weight: 86.2 kg   Examination: Physical Exam:  Constitutional: WN/WD AAM in NAD appears calm Respiratory: Diminished to auscultation bilaterally, no wheezing, rales, rhonchi or crackles. Normal respiratory effort and patient is not tachypenic. No accessory muscle use. Unlabored breathing  Cardiovascular: RRR, no murmurs / rubs / gallops. S1 and S2 auscultated. No extremity edema.  Abdomen: Soft, non-tender, non-distended. Bowel sounds positive.  GU: Deferred. Musculoskeletal: No clubbing / cyanosis of digits/nails. No joint deformity upper and lower extremities.  Skin: No rashes, lesions, ulcers on a limited skin evaluation. No induration; Warm and dry.  Neurologic: CN 2-12 grossly intact with no focal deficits. Romberg sign and cerebellar reflexes not assessed.  Psychiatric: Normal judgment and insight. Alert and oriented x 3. Normal mood and appropriate affect.   Data Reviewed: I have personally reviewed following labs and imaging studies  CBC: Recent Labs  Lab 07/15/23 0438 07/16/23 0506 07/17/23 0505 07/18/23 0450  07/19/23 0448  WBC 4.4 4.8 4.6 5.8 6.2  NEUTROABS  --   --   --   --  2.7  HGB 10.0* 9.9* 9.9* 10.2* 10.1*  HCT 31.6* 31.6* 31.1* 32.1* 31.8*  MCV 92.9 93.2 91.5 92.2 92.7  PLT 163 212 226 276 324   Basic Metabolic Panel: Recent Labs  Lab 07/16/23 0506 07/17/23 0505 07/19/23 0448  NA  --  140 136  K  --  4.1 4.0  CL  --  107 105  CO2  --  24 23  GLUCOSE  --  88 96  BUN  --  14 18  CREATININE 0.88 0.89 0.78  CALCIUM  --  8.7* 8.5*  MG  --  2.1 2.0  PHOS  --  4.5 4.4   GFR: Estimated Creatinine Clearance: 105.5 mL/min (by C-G formula based on SCr of 0.78 mg/dL). Liver Function Tests: Recent Labs  Lab 07/17/23 0505 07/19/23 0448  AST 31 26  ALT 22 28  ALKPHOS 43 48  BILITOT <0.1* 0.3  PROT 6.5 6.8  ALBUMIN 2.8* 3.0*   No results for input(s): "LIPASE", "AMYLASE" in the last 168 hours. No results for input(s): "AMMONIA" in the last 168 hours. Coagulation Profile: Recent Labs  Lab 07/15/23 0438 07/16/23 0506 07/17/23 0505  07/18/23 0450 07/19/23 0448  INR 1.0 1.0 1.1 1.4* 1.6*   Cardiac Enzymes: No results for input(s): "CKTOTAL", "CKMB", "CKMBINDEX", "TROPONINI" in the last 168 hours. BNP (last 3 results) No results for input(s): "PROBNP" in the last 8760 hours. HbA1C: No results for input(s): "HGBA1C" in the last 72 hours. CBG: No results for input(s): "GLUCAP" in the last 168 hours. Lipid Profile: No results for input(s): "CHOL", "HDL", "LDLCALC", "TRIG", "CHOLHDL", "LDLDIRECT" in the last 72 hours. Thyroid Function Tests: No results for input(s): "TSH", "T4TOTAL", "FREET4", "T3FREE", "THYROIDAB" in the last 72 hours. Anemia Panel: Recent Labs    07/19/23 0448  VITAMINB12 222  FOLATE 7.5  FERRITIN 25  TIBC 328  IRON 25*  RETICCTPCT 1.4   Sepsis Labs: No results for input(s): "PROCALCITON", "LATICACIDVEN" in the last 168 hours.  Recent Results (from the past 240 hour(s))  Resp panel by RT-PCR (RSV, Flu A&B, Covid) Anterior Nasal Swab      Status: None   Collection Time: 07/10/23  4:37 PM   Specimen: Anterior Nasal Swab  Result Value Ref Range Status   SARS Coronavirus 2 by RT PCR NEGATIVE NEGATIVE Final    Comment: (NOTE) SARS-CoV-2 target nucleic acids are NOT DETECTED.  The SARS-CoV-2 RNA is generally detectable in upper respiratory specimens during the acute phase of infection. The lowest concentration of SARS-CoV-2 viral copies this assay can detect is 138 copies/mL. A negative result does not preclude SARS-Cov-2 infection and should not be used as the sole basis for treatment or other patient management decisions. A negative result may occur with  improper specimen collection/handling, submission of specimen other than nasopharyngeal swab, presence of viral mutation(s) within the areas targeted by this assay, and inadequate number of viral copies(<138 copies/mL). A negative result must be combined with clinical observations, patient history, and epidemiological information. The expected result is Negative.  Fact Sheet for Patients:  BloggerCourse.com  Fact Sheet for Healthcare Providers:  SeriousBroker.it  This test is no t yet approved or cleared by the [place] FDA and  has been authorized for detection and/or diagnosis of SARS-CoV-2 by FDA under an Emergency Use Authorization (EUA). This EUA will remain  in effect (meaning this test can be used) for the duration of the COVID-19 declaration under Section 564(b)(1) of the Act, 21 U.S.C.section 360bbb-3(b)(1), unless the authorization is terminated  or revoked sooner.       Influenza A by PCR NEGATIVE NEGATIVE Final   Influenza B by PCR NEGATIVE NEGATIVE Final    Comment: (NOTE) The Xpert Xpress SARS-CoV-2/FLU/RSV plus assay is intended as an aid in the diagnosis of influenza from Nasopharyngeal swab specimens and should not be used as a sole basis for treatment. Nasal washings and aspirates are  unacceptable for Xpert Xpress SARS-CoV-2/FLU/RSV testing.  Fact Sheet for Patients: BloggerCourse.com  Fact Sheet for Healthcare Providers: SeriousBroker.it  This test is not yet approved or cleared by the [place] FDA and has been authorized for detection and/or diagnosis of SARS-CoV-2 by FDA under an Emergency Use Authorization (EUA). This EUA will remain in effect (meaning this test can be used) for the duration of the COVID-19 declaration under Section 564(b)(1) of the Act, 21 U.S.C. section 360bbb-3(b)(1), unless the authorization is terminated or revoked.     Resp Syncytial Virus by PCR NEGATIVE NEGATIVE Final    Comment: (NOTE) Fact Sheet for Patients: BloggerCourse.com  Fact Sheet for Healthcare Providers: SeriousBroker.it  This test is not yet approved or cleared by the [place] FDA  and has been authorized for detection and/or diagnosis of SARS-CoV-2 by FDA under an Emergency Use Authorization (EUA). This EUA will remain in effect (meaning this test can be used) for the duration of the COVID-19 declaration under Section 564(b)(1) of the Act, 21 U.S.C. section 360bbb-3(b)(1), unless the authorization is terminated or revoked.  Performed at [facility], [address], [place], [place] [address]   MRSA Next Gen by PCR, Nasal     Status: Abnormal   Collection Time: 07/10/23 10:12 PM   Specimen: Nasal Mucosa; Nasal Swab  Result Value Ref Range Status   MRSA by PCR Next Gen DETECTED (A) NOT DETECTED Final    Comment: (NOTE) The GeneXpert MRSA Assay (FDA approved for NASAL specimens only), is one component of a comprehensive MRSA colonization surveillance program. It is not intended to diagnose MRSA infection nor to guide or monitor treatment for MRSA infections. Test performance is not FDA approved in patients less than 36  years old. Performed at [facility], [address], [place], [place] [address]     Radiology Studies: No results found.  Scheduled Meds:  enoxaparin (LOVENOX) injection  90 mg Subcutaneous Q12H   melatonin  3 mg Oral QHS   mupirocin ointment   Nasal BID   polyethylene glycol  17 g Oral BID   senna  1 tablet Oral QHS   sodium chloride flush  3 mL Intravenous Q12H   Warfarin - Pharmacist Dosing Inpatient   Does not apply q1600   Continuous Infusions:   LOS: 9 days   Marguerita Merles, DO [facility] Available via Epic secure chat 7am-7pm After these hours, please refer to coverage provider listed on [email or web address] 07/19/2023, 8:48 AM

## 2023-07-19 NOTE — Progress Notes (Signed)
Mobility Specialist - Progress Note   07/19/23 1236  Mobility  Activity Ambulated independently in hallway  Level of Assistance Independent  Assistive Device None  Distance Ambulated (ft) 1000 ft  Range of Motion/Exercises Active  Activity Response Tolerated well  Mobility Referral Yes  $Mobility charge 1 Mobility  Mobility Specialist Start Time (ACUTE ONLY) 1200  Mobility Specialist Stop Time (ACUTE ONLY) 1236  Mobility Specialist Time Calculation (min) (ACUTE ONLY) 36 min   Pt found in recliner chair and agreeable to ambulate. Pt stated MD approved outside ambulation. Per RN pt is okay to ambulate outside with staffing. Pt had no complaints. Returned to recliner chair with all needs met. Call bell in reach and RN notified.  Billey Chang Mobility Specialist

## 2023-07-19 NOTE — Plan of Care (Signed)
[facility] Tonsillectomy/Adenoidectomy, Postoperative PEDS care plan entered in error.
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
## 2023-07-20 DIAGNOSIS — I82431 Acute embolism and thrombosis of right popliteal vein: Secondary | ICD-10-CM | POA: Diagnosis not present

## 2023-07-20 DIAGNOSIS — I10 Essential (primary) hypertension: Secondary | ICD-10-CM | POA: Diagnosis not present

## 2023-07-20 DIAGNOSIS — I749 Embolism and thrombosis of unspecified artery: Secondary | ICD-10-CM | POA: Diagnosis not present

## 2023-07-20 DIAGNOSIS — I2609 Other pulmonary embolism with acute cor pulmonale: Secondary | ICD-10-CM | POA: Diagnosis not present

## 2023-07-20 LAB — PROTIME-INR
INR: 1.7 — ABNORMAL HIGH (ref 0.8–1.2)
Prothrombin Time: 19.9 s — ABNORMAL HIGH (ref 11.4–15.2)

## 2023-07-20 MED ORDER — WARFARIN SODIUM 5 MG PO TABS
12.5000 mg | ORAL_TABLET | Freq: Once | ORAL | Status: AC
  Administered 2023-07-20: 12.5 mg via ORAL
  Filled 2023-07-20: qty 1

## 2023-07-20 NOTE — Progress Notes (Signed)
Mobility Specialist - Progress Note   07/20/23 1448  Mobility  Activity Ambulated independently in hallway  Level of Assistance Independent  Assistive Device None  Distance Ambulated (ft) 1000 ft  Range of Motion/Exercises Active  Activity Response Tolerated well  Mobility Referral Yes  $Mobility charge 1 Mobility  Mobility Specialist Start Time (ACUTE ONLY) 1415  Mobility Specialist Stop Time (ACUTE ONLY) 1448  Mobility Specialist Time Calculation (min) (ACUTE ONLY) 33 min   Pt was found in hallway and requested to ambulate in hallway. Pt taken outside and ambulated without complaints. At EOS returned to room with all needs met.  Billey Chang Mobility Specialist

## 2023-07-20 NOTE — Progress Notes (Signed)
PROGRESS NOTE    Alexander [name]  OZD:[id number] DOB: [birth year] DOA: 07/10/2023 PCP: Jarold Motto, PA   Brief Narrative:  The patient is a 62 year old AAM with a PMH significant for but not limited to hx of DVT, PE, not on anticoagulation (self discontinued) presenting with chest pain.  Found to have large clot burden on CTA chest was sign suggestive of right heart failure.  EDP consulted with pulmonary medicine who recommended against lytics.  Admitted to stepdown unit for IV heparin and further evaluation. Due to ongoing chest pain, tachycardia and hypoxia he was  Seen by pulmonology, after discussion with interventional radiology, was treated with catheter directed thrombolytics with dramatic improvement.  Postprocedure the patient has done well and has now been weaned off oxygen.  Unable to afford DOAC's.  Therefore started on warfarin with enoxaparin bridge.  Since he cannot afford Lovenox , he will need to remain in hospital until INR therapeutic.  He will need Close outpatient follow-up with Warfarin clinic at [facility].   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 -Large clot burden on CTA with RHS on CT.  -Echo low normal LVEF 50-55%, no WMA, grade I DD. RV systolic function mildly reduced.  Severe pulmonary hypertension. -Seen by Pulmonology and Interventional Radiology.  Catheter directed lytics initiated 10/11.  Will need lifelong anticoagulation: warfarin based on cost.   -Will need Outpatient hypercoagulability workup.  Suggest outpatient sleep study. -Repeat echo in 3 months to see if there is any improvement  -Follow-up with PCCM as an outpatient -Hypoxia resolved -Continue enoxaparin bridge, warfarin per pharmacy for  now but enoxaparin injections may be too prohibitive expensive so we will need to remain inpatient until INR therapeutic; Last PT-INR was 19.9-1.7 Respectively  -Pharmacy is now giving a dose of Coumadin 12.5 mg x1 today    DVT (deep venous thrombosis) (HCC) -VTE Schan showing R popliteal DVT indeterminate and no DVT on Left.  History of clot in right leg. -C/w Warfarin with Enoxaparin bridge as above as above  Normocytic Anemia -Hgb/Hct relatively Stable now -Hgb/Hct Trend: Recent Labs  Lab 07/13/23 1907 07/14/23 0331 07/15/23 0438 07/16/23 0506 07/17/23 0505 07/18/23 0450 07/19/23 0448  HGB 11.6* 9.9* 10.0* 9.9* 9.9* 10.2* 10.1*  HCT 37.4* 31.3* 31.6* 31.6* 31.1* 32.1* 31.8*  MCV 94.7 92.1 92.9 93.2 91.5 92.2 92.7  -Checked Anemia Panel and showed and Iron Level of 25, UIBC was 303, TIBC was 328, Saturation Ratios of 8%, Ferritin of 25, Folate of 7.5, and Vitamin B12 of 222 -Continue to Monitor for S/Sx of Bleeding; No overt bleeding noted -Repeat CBC in the AM  Thrombocytopenia, resolved -Platelet Count Trend: Recent Labs  Lab 07/13/23 1907 07/14/23 0331 07/15/23 0438 07/16/23 0506 07/17/23 0505 07/18/23 0450 07/19/23 0448  PLT 147* 137* 163 212 226 276 324  -Continue to Monitor for S/Sx of Bleeding; no overt bleeding noted -Repeat CBC in the AM  Hypoalbuminemia -Patient's Albumin Trend: Recent Labs  Lab 07/17/23 0505 07/19/23 0448  ALBUMIN 2.8* 3.0*  -Continue to Monitor and Trend and repeat CMP in the AM  Insomnia -C/w Trazodone 50 mg po at bedtime prn Sleep and also add Diphenhydramine 25 mg po at bedtime prn Sleep if Trazodone is ineffective -Also add Melatonin tab 3 mg po qHS   DVT prophylaxis:  warfarin (COUMADIN) tablet 12.5 mg    Code Status: Full Code Family Communication: No family present at bedside  Disposition Plan:  Level of care: Med-Surg Status is: Inpatient Remains inpatient appropriate because: Needs to have his INR therapeutic prior  to safe discharge   Consultants:  PCCM/Pumonary Interventional Radiology  Procedures:  10/11 Bilateral pulmonary angiogram and initiation of PE lysis   Antimicrobials:  Anti-infectives (From admission, onward)    None       Subjective: Seen and examined at bedside and he is resting.  Feels okay.  No nausea or vomiting.  Denies any other concerns or complaints at this time.  Objective: Vitals:   07/19/23 1500 07/19/23 2100 07/20/23 0622 07/20/23 1215  BP:  126/79 126/83 138/89  Pulse:  69 70 69  Resp:  19 18 18   Temp:  98 F (36.7 C) 98.2 F (36.8 C) (!) 97.5 F (36.4 C)  TempSrc:  Oral Oral Oral  SpO2:  100% 100% 99%  Weight: 86.2 kg     Height:        Intake/Output Summary (Last 24 hours) at 07/20/2023 1233 Last data filed at 07/20/2023 0932 Gross per 24 hour  Intake 720 ml  Output --  Net 720 ml   Filed Weights   07/10/23 1145 07/19/23 1500  Weight: 86.2 kg 86.2 kg   Examination: Physical Exam:  Constitutional: WN/WD African-American male in no acute distress appears calm Respiratory: Diminished to auscultation bilaterally, no wheezing, rales, rhonchi or crackles. Normal respiratory effort and patient is not tachypenic. No accessory muscle use.  Unlabored breathing Cardiovascular: RRR, no murmurs / rubs / gallops. S1 and S2 auscultated. No extremity edema.   Abdomen: Soft, non-tender, distended secondary to body habitus. Bowel sounds positive.  GU: Deferred. Musculoskeletal: No clubbing / cyanosis of digits/nails. No joint deformity upper and lower extremities.  Skin: No rashes, lesions, ulcers on a limited skin evaluation. No induration; Warm and dry.  Neurologic: CN 2-12 grossly intact with no focal deficits. Romberg sign and cerebellar reflexes not assessed.  Psychiatric: Normal judgment and insight. Alert and oriented x 3. Normal mood and appropriate affect.   Data Reviewed: I have personally reviewed following labs and imaging studies  CBC: Recent  Labs  Lab 07/15/23 0438 07/16/23 0506 07/17/23 0505 07/18/23 0450 07/19/23 0448  WBC 4.4 4.8 4.6 5.8 6.2  NEUTROABS  --   --   --   --  2.7  HGB 10.0* 9.9* 9.9* 10.2* 10.1*  HCT 31.6* 31.6* 31.1* 32.1* 31.8*  MCV 92.9 93.2 91.5 92.2 92.7  PLT 163 212 226 276 324   Basic Metabolic Panel: Recent Labs  Lab 07/16/23 0506 07/17/23 0505 07/19/23 0448  NA  --  140 136  K  --  4.1 4.0  CL  --  107 105  CO2  --  24 23  GLUCOSE  --  88 96  BUN  --  14 18  CREATININE 0.88 0.89 0.78  CALCIUM  --  8.7* 8.5*  MG  --  2.1 2.0  PHOS  --  4.5 4.4   GFR: Estimated Creatinine Clearance: 105.5 mL/min (by C-G formula based on SCr of 0.78 mg/dL). Liver Function Tests: Recent Labs  Lab 07/17/23 0505 07/19/23 0448  AST 31 26  ALT 22 28  ALKPHOS 43 48  BILITOT <0.1* 0.3  PROT 6.5 6.8  ALBUMIN 2.8* 3.0*   No results for input(s): "LIPASE", "AMYLASE" in the last 168 hours. No results for input(s): "AMMONIA" in  the last 168 hours. Coagulation Profile: Recent Labs  Lab 07/16/23 0506 07/17/23 0505 07/18/23 0450 07/19/23 0448 07/20/23 0539  INR 1.0 1.1 1.4* 1.6* 1.7*   Cardiac Enzymes: No results for input(s): "CKTOTAL", "CKMB", "CKMBINDEX", "TROPONINI" in the last 168 hours. BNP (last 3 results) No results for input(s): "PROBNP" in the last 8760 hours. HbA1C: No results for input(s): "HGBA1C" in the last 72 hours. CBG: No results for input(s): "GLUCAP" in the last 168 hours. Lipid Profile: No results for input(s): "CHOL", "HDL", "LDLCALC", "TRIG", "CHOLHDL", "LDLDIRECT" in the last 72 hours. Thyroid Function Tests: No results for input(s): "TSH", "T4TOTAL", "FREET4", "T3FREE", "THYROIDAB" in the last 72 hours. Anemia Panel: Recent Labs    07/19/23 0448  VITAMINB12 222  FOLATE 7.5  FERRITIN 25  TIBC 328  IRON 25*  RETICCTPCT 1.4   Sepsis Labs: No results for input(s): "PROCALCITON", "LATICACIDVEN" in the last 168 hours.  Recent Results (from the past 240 hour(s))   Resp panel by RT-PCR (RSV, Flu A&B, Covid) Anterior Nasal Swab     Status: None   Collection Time: 07/10/23  4:37 PM   Specimen: Anterior Nasal Swab  Result Value Ref Range Status   SARS Coronavirus 2 by RT PCR NEGATIVE NEGATIVE Final    Comment: (NOTE) SARS-CoV-2 target nucleic acids are NOT DETECTED.  The SARS-CoV-2 RNA is generally detectable in upper respiratory specimens during the acute phase of infection. The lowest concentration of SARS-CoV-2 viral copies this assay can detect is 138 copies/mL. A negative result does not preclude SARS-Cov-2 infection and should not be used as the sole basis for treatment or other patient management decisions. A negative result may occur with  improper specimen collection/handling, submission of specimen other than nasopharyngeal swab, presence of viral mutation(s) within the areas targeted by this assay, and inadequate number of viral copies(<138 copies/mL). A negative result must be combined with clinical observations, patient history, and epidemiological information. The expected result is Negative.  Fact Sheet for Patients:  BloggerCourse.com  Fact Sheet for Healthcare Providers:  SeriousBroker.it  This test is no t yet approved or cleared by the [place] FDA and  has been authorized for detection and/or diagnosis of SARS-CoV-2 by FDA under an Emergency Use Authorization (EUA). This EUA will remain  in effect (meaning this test can be used) for the duration of the COVID-19 declaration under Section 564(b)(1) of the Act, 21 U.S.C.section 360bbb-3(b)(1), unless the authorization is terminated  or revoked sooner.       Influenza A by PCR NEGATIVE NEGATIVE Final   Influenza B by PCR NEGATIVE NEGATIVE Final    Comment: (NOTE) The Xpert Xpress SARS-CoV-2/FLU/RSV plus assay is intended as an aid in the diagnosis of influenza from Nasopharyngeal swab specimens and should not be used  as a sole basis for treatment. Nasal washings and aspirates are unacceptable for Xpert Xpress SARS-CoV-2/FLU/RSV testing.  Fact Sheet for Patients: BloggerCourse.com  Fact Sheet for Healthcare Providers: SeriousBroker.it  This test is not yet approved or cleared by the [place] FDA and has been authorized for detection and/or diagnosis of SARS-CoV-2 by FDA under an Emergency Use Authorization (EUA). This EUA will remain in effect (meaning this test can be used) for the duration of the COVID-19 declaration under Section 564(b)(1) of the Act, 21 U.S.C. section 360bbb-3(b)(1), unless the authorization is terminated or revoked.     Resp Syncytial Virus by PCR NEGATIVE NEGATIVE Final    Comment: (NOTE) Fact Sheet for Patients: BloggerCourse.com  Fact Sheet for Healthcare  Providers: SeriousBroker.it  This test is not yet approved or cleared by the [place] and has been authorized for detection and/or diagnosis of SARS-CoV-2 by FDA under an Emergency Use Authorization (EUA). This EUA will remain in effect (meaning this test can be used) for the duration of the COVID-19 declaration under Section 564(b)(1) of the Act, 21 U.S.C. section 360bbb-3(b)(1), unless the authorization is terminated or revoked.  Performed at [facility], [address], [place], [place] [address]   MRSA Next Gen by PCR, Nasal     Status: Abnormal   Collection Time: 07/10/23 10:12 PM   Specimen: Nasal Mucosa; Nasal Swab  Result Value Ref Range Status   MRSA by PCR Next Gen DETECTED (A) NOT DETECTED Final    Comment: (NOTE) The GeneXpert MRSA Assay (FDA approved for NASAL specimens only), is one component of a comprehensive MRSA colonization surveillance program. It is not intended to diagnose MRSA infection nor to guide or monitor treatment for MRSA infections. Test  performance is not FDA approved in patients less than 47 years old. Performed at [facility], [address], [place], [place] [address]     Radiology Studies: No results found.  Scheduled Meds:  enoxaparin (LOVENOX) injection  90 mg Subcutaneous Q12H   melatonin  3 mg Oral QHS   mupirocin ointment   Nasal BID   polyethylene glycol  17 g Oral BID   senna  1 tablet Oral QHS   sodium chloride flush  3 mL Intravenous Q12H   warfarin  12.5 mg Oral ONCE-1600   Warfarin - Pharmacist Dosing Inpatient   Does not apply q1600   Continuous Infusions:   LOS: 10 days   Marguerita Merles, DO [facility] Available via Epic secure chat 7am-7pm After these hours, please refer to coverage provider listed on [email or web address] 07/20/2023, 12:33 PM
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
## 2023-07-21 DIAGNOSIS — I10 Essential (primary) hypertension: Secondary | ICD-10-CM | POA: Diagnosis not present

## 2023-07-21 DIAGNOSIS — I2609 Other pulmonary embolism with acute cor pulmonale: Secondary | ICD-10-CM | POA: Diagnosis not present

## 2023-07-21 DIAGNOSIS — I82431 Acute embolism and thrombosis of right popliteal vein: Secondary | ICD-10-CM | POA: Diagnosis not present

## 2023-07-21 DIAGNOSIS — I749 Embolism and thrombosis of unspecified artery: Secondary | ICD-10-CM | POA: Diagnosis not present

## 2023-07-21 LAB — PROTIME-INR
INR: 1.9 — ABNORMAL HIGH (ref 0.8–1.2)
Prothrombin Time: 22.4 s — ABNORMAL HIGH (ref 11.4–15.2)

## 2023-07-21 MED ORDER — CAMPHOR-MENTHOL 0.5-0.5 % EX LOTN
TOPICAL_LOTION | CUTANEOUS | Status: DC | PRN
  Filled 2023-07-21: qty 222

## 2023-07-21 MED ORDER — WARFARIN SODIUM 5 MG PO TABS
10.0000 mg | ORAL_TABLET | Freq: Once | ORAL | Status: AC
  Administered 2023-07-21: 10 mg via ORAL
  Filled 2023-07-21: qty 2

## 2023-07-21 NOTE — Plan of Care (Signed)
  Problem: Clinical Measurements: Goal: Ability to maintain clinical measurements within normal limits will improve Outcome: Progressing   Problem: Clinical Measurements: Goal: Will remain free from infection Outcome: Progressing   Problem: Activity: Goal: Risk for activity intolerance will decrease Outcome: Progressing   Problem: Coping: Goal: Level of anxiety will decrease Outcome: Progressing   Problem: Pain Managment: Goal: General experience of comfort will improve Outcome: Progressing   Problem: Safety: Goal: Ability to remain free from injury will improve Outcome: Progressing
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
## 2023-07-21 NOTE — Progress Notes (Signed)
PROGRESS NOTE    Alexander [name]  YNW:[id number] DOB: 07/24/1961 DOA: 07/10/2023 PCP: Jarold Motto, PA   Brief Narrative:  The patient is a 62 year old AAM with a PMH significant for but not limited to hx of DVT, PE, not on anticoagulation (self discontinued) presenting with chest pain.  Found to have large clot burden on CTA chest was sign suggestive of right heart failure.  EDP consulted with pulmonary medicine who recommended against lytics.  Admitted to stepdown unit for IV heparin and further evaluation. Due to ongoing chest pain, tachycardia and hypoxia he was  Seen by pulmonology, after discussion with interventional radiology, was treated with catheter directed thrombolytics with dramatic improvement.  Postprocedure the patient has done well and has now been weaned off oxygen.  Unable to afford DOAC's.  Therefore started on warfarin with enoxaparin bridge.  Since he cannot afford Lovenox , he will need to remain in hospital until INR therapeutic.  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 -Large clot burden on CTA with RHS on CT.  -Echo low normal LVEF 50-55%, no WMA, grade I DD. RV systolic function mildly reduced.  Severe pulmonary hypertension. -Seen by Pulmonology and Interventional Radiology.  Catheter directed lytics initiated 10/11.  Will need lifelong anticoagulation: warfarin based on cost.   -Will need Outpatient hypercoagulability workup.  Suggest outpatient sleep study. -Repeat echo in 3 months to see if there is any improvement  -Follow-up with PCCM as an outpatient -Hypoxia resolved -Continue  enoxaparin bridge, warfarin per pharmacy for now but enoxaparin injections may be too prohibitive expensive so we will need to remain inpatient until INR therapeutic; Last PT-INR was 22.4-1.9 Respectively  -Pharmacy is now giving a dose of Coumadin 10 mg x1 today  -Patient has an appointment with the Anticoagulation Clinic on 10/23   DVT (deep venous thrombosis) (HCC) -VTE Schan showing R popliteal DVT indeterminate and no DVT on Left.  History of clot in right leg. -C/w Warfarin with Enoxaparin bridge as above as above  Normocytic Anemia -Hgb/Hct relatively Stable now -Hgb/Hct Trend: Recent Labs  Lab 07/13/23 1907 07/14/23 0331 07/15/23 0438 07/16/23 0506 07/17/23 0505 07/18/23 0450 07/19/23 0448  HGB 11.6* 9.9* 10.0* 9.9* 9.9* 10.2* 10.1*  HCT 37.4* 31.3* 31.6* 31.6* 31.1* 32.1* 31.8*  MCV 94.7 92.1 92.9 93.2 91.5 92.2 92.7  -Checked Anemia Panel and showed and Iron Level of 25, UIBC was 303, TIBC was 328, Saturation Ratios of 8%, Ferritin of 25, Folate of 7.5, and Vitamin B12 of 222 -Continue to Monitor for S/Sx of Bleeding; No overt bleeding noted -Repeat CBC in the AM  Thrombocytopenia, resolved -Platelet Count Trend: Recent Labs  Lab 07/13/23 1907 07/14/23 0331 07/15/23 0438 07/16/23 0506 07/17/23 0505 07/18/23 0450 07/19/23 0448  PLT 147* 137* 163 212 226 276 324  -Continue to Monitor for S/Sx of Bleeding; no overt bleeding noted -Repeat CBC in the AM  Hypoalbuminemia -Patient's Albumin Trend: Recent Labs  Lab 07/17/23 0505 07/19/23 0448  ALBUMIN 2.8* 3.0*  -Continue to Monitor and Trend and repeat CMP in the AM  Insomnia -C/w Trazodone 50 mg po at bedtime prn Sleep and also add Diphenhydramine 25 mg po at bedtime prn Sleep  if Trazodone is ineffective -Also add Melatonin tab 3 mg po qHS   DVT prophylaxis:  warfarin (COUMADIN) tablet 10 mg  and Lovenox Bridge    Code Status: Full Code Family Communication: No family present at bedside   Disposition  Plan:  Level of care: Med-Surg Status is: Inpatient Remains inpatient appropriate because: Needs to have his INR therapeutic prior to safe discharge and anticipate D/C in the next 24 hours   Consultants:  PCCM/Pumonary Interventional Radiology  Procedures:  10/11 Bilateral pulmonary angiogram and initiation of PE lysis   Antimicrobials:  Anti-infectives (From admission, onward)    None       Subjective: Seen and examined at bedside and was resting in no complaints.  Wanting another cane.  No chest pain or shortness breath.  Feels okay.  No other concerns or complaint at this time.  Objective: Vitals:   07/20/23 1958 07/21/23 0404 07/21/23 0926 07/21/23 1218  BP: (!) 142/79 132/75 (!) 120/52 (!) 108/91  Pulse: 81 72  79  Resp: 17 18 20 18   Temp: 98.8 F (37.1 C) 98 F (36.7 C) 98.2 F (36.8 C) 98.1 F (36.7 C)  TempSrc: Oral Oral Oral Oral  SpO2: 99% 98% 98% 100%  Weight:      Height:        Intake/Output Summary (Last 24 hours) at 07/21/2023 1444 Last data filed at 07/21/2023 4540 Gross per 24 hour  Intake 220 ml  Output --  Net 220 ml   Filed Weights   07/10/23 1145 07/19/23 1500  Weight: 86.2 kg 86.2 kg   Examination: Physical Exam:  Constitutional: WN/WD African-American male in no acute distress appears calm Respiratory: Diminished to auscultation bilaterally, no wheezing, rales, rhonchi or crackles. Normal respiratory effort and patient is not tachypenic. No accessory muscle use.  Unlabored breathing Cardiovascular: RRR, no murmurs / rubs / gallops. S1 and S2 auscultated. No extremity edema. Abdomen: Soft, non-tender, somewhat distended secondary body habitus. Bowel sounds positive.  GU: Deferred. Musculoskeletal: No clubbing / cyanosis of digits/nails.  No appreciable joint deformities in the upper and lower extremities Skin: No rashes, lesions, ulcers on limited skin evaluation. No induration; Warm and dry.  Neurologic: CN 2-12 grossly intact with no  focal deficits. Romberg sign and cerebellar reflexes not assessed.  Psychiatric: Normal judgment and insight. Alert and oriented x 3. Normal mood and appropriate affect.   Data Reviewed: I have personally reviewed following labs and imaging studies  CBC: Recent Labs  Lab 07/15/23 0438 07/16/23 0506 07/17/23 0505 07/18/23 0450 07/19/23 0448  WBC 4.4 4.8 4.6 5.8 6.2  NEUTROABS  --   --   --   --  2.7  HGB 10.0* 9.9* 9.9* 10.2* 10.1*  HCT 31.6* 31.6* 31.1* 32.1* 31.8*  MCV 92.9 93.2 91.5 92.2 92.7  PLT 163 212 226 276 324   Basic Metabolic Panel: Recent Labs  Lab 07/16/23 0506 07/17/23 0505 07/19/23 0448  NA  --  140 136  K  --  4.1 4.0  CL  --  107 105  CO2  --  24 23  GLUCOSE  --  88 96  BUN  --  14 18  CREATININE 0.88 0.89 0.78  CALCIUM  --  8.7* 8.5*  MG  --  2.1 2.0  PHOS  --  4.5 4.4   GFR: Estimated Creatinine Clearance: 105.5 mL/min (by C-G formula based on SCr of 0.78 mg/dL). Liver Function Tests: Recent Labs  Lab 07/17/23 0505 07/19/23 0448  AST  31 26  ALT 22 28  ALKPHOS 43 48  BILITOT <0.1* 0.3  PROT 6.5 6.8  ALBUMIN 2.8* 3.0*   No results for input(s): "LIPASE", "AMYLASE" in the last 168 hours. No results for input(s): "AMMONIA" in the last 168 hours. Coagulation Profile: Recent Labs  Lab 07/17/23 0505 07/18/23 0450 07/19/23 0448 07/20/23 0539 07/21/23 0524  INR 1.1 1.4* 1.6* 1.7* 1.9*   Cardiac Enzymes: No results for input(s): "CKTOTAL", "CKMB", "CKMBINDEX", "TROPONINI" in the last 168 hours. BNP (last 3 results) No results for input(s): "PROBNP" in the last 8760 hours. HbA1C: No results for input(s): "HGBA1C" in the last 72 hours. CBG: No results for input(s): "GLUCAP" in the last 168 hours. Lipid Profile: No results for input(s): "CHOL", "HDL", "LDLCALC", "TRIG", "CHOLHDL", "LDLDIRECT" in the last 72 hours. Thyroid Function Tests: No results for input(s): "TSH", "T4TOTAL", "FREET4", "T3FREE", "THYROIDAB" in the last 72  hours. Anemia Panel: Recent Labs    07/19/23 0448  VITAMINB12 222  FOLATE 7.5  FERRITIN 25  TIBC 328  IRON 25*  RETICCTPCT 1.4   Sepsis Labs: No results for input(s): "PROCALCITON", "LATICACIDVEN" in the last 168 hours.  No results found for this or any previous visit (from the past 240 hour(s)).   Radiology Studies: No results found.  Scheduled Meds:  enoxaparin (LOVENOX) injection  90 mg Subcutaneous Q12H   melatonin  3 mg Oral QHS   mupirocin ointment   Nasal BID   polyethylene glycol  17 g Oral BID   senna  1 tablet Oral QHS   sodium chloride flush  3 mL Intravenous Q12H   warfarin  10 mg Oral ONCE-1600   Warfarin - Pharmacist Dosing Inpatient   Does not apply q1600   Continuous Infusions:   LOS: 11 days   Marguerita Merles, DO [facility] Available via Epic secure chat 7am-7pm After these hours, please refer to coverage provider listed on [email or web address] 07/21/2023, 2:44 PM

## 2023-07-22 DIAGNOSIS — I82431 Acute embolism and thrombosis of right popliteal vein: Secondary | ICD-10-CM | POA: Diagnosis not present

## 2023-07-22 DIAGNOSIS — I749 Embolism and thrombosis of unspecified artery: Secondary | ICD-10-CM | POA: Diagnosis not present

## 2023-07-22 DIAGNOSIS — I10 Essential (primary) hypertension: Secondary | ICD-10-CM | POA: Diagnosis not present

## 2023-07-22 DIAGNOSIS — I2781 Cor pulmonale (chronic): Secondary | ICD-10-CM | POA: Diagnosis not present

## 2023-07-22 LAB — CBC WITH DIFFERENTIAL/PLATELET
Abs Immature Granulocytes: 0.03 10*3/uL (ref 0.00–0.07)
Basophils Absolute: 0 10*3/uL (ref 0.0–0.1)
Basophils Relative: 1 %
Eosinophils Absolute: 1.1 10*3/uL — ABNORMAL HIGH (ref 0.0–0.5)
Eosinophils Relative: 17 %
HCT: 33.2 % — ABNORMAL LOW (ref 39.0–52.0)
Hemoglobin: 10.4 g/dL — ABNORMAL LOW (ref 13.0–17.0)
Immature Granulocytes: 1 %
Lymphocytes Relative: 30 %
Lymphs Abs: 2 10*3/uL (ref 0.7–4.0)
MCH: 28.6 pg (ref 26.0–34.0)
MCHC: 31.3 g/dL (ref 30.0–36.0)
MCV: 91.2 fL (ref 80.0–100.0)
Monocytes Absolute: 0.6 10*3/uL (ref 0.1–1.0)
Monocytes Relative: 10 %
Neutro Abs: 2.9 10*3/uL (ref 1.7–7.7)
Neutrophils Relative %: 41 %
Platelets: 485 10*3/uL — ABNORMAL HIGH (ref 150–400)
RBC: 3.64 MIL/uL — ABNORMAL LOW (ref 4.22–5.81)
RDW: 17.4 % — ABNORMAL HIGH (ref 11.5–15.5)
WBC: 6.6 10*3/uL (ref 4.0–10.5)
nRBC: 0 % (ref 0.0–0.2)

## 2023-07-22 LAB — COMPREHENSIVE METABOLIC PANEL
ALT: 39 U/L (ref 0–44)
AST: 33 U/L (ref 15–41)
Albumin: 3 g/dL — ABNORMAL LOW (ref 3.5–5.0)
Alkaline Phosphatase: 50 U/L (ref 38–126)
Anion gap: 8 (ref 5–15)
BUN: 17 mg/dL (ref 8–23)
CO2: 23 mmol/L (ref 22–32)
Calcium: 8.8 mg/dL — ABNORMAL LOW (ref 8.9–10.3)
Chloride: 105 mmol/L (ref 98–111)
Creatinine, Ser: 0.91 mg/dL (ref 0.61–1.24)
GFR, Estimated: >60 mL/min (ref >60)
Glucose, Bld: 102 mg/dL — ABNORMAL HIGH (ref 70–99)
Potassium: 3.8 mmol/L (ref 3.5–5.1)
Sodium: 136 mmol/L (ref 135–145)
Total Bilirubin: 0.2 mg/dL — ABNORMAL LOW (ref 0.3–1.2)
Total Protein: 6.9 g/dL (ref 6.5–8.1)

## 2023-07-22 LAB — MAGNESIUM: Magnesium: 2.1 mg/dL (ref 1.7–2.4)

## 2023-07-22 LAB — PHOSPHORUS: Phosphorus: 4.7 mg/dL — ABNORMAL HIGH (ref 2.5–4.6)

## 2023-07-22 LAB — FACTOR 5 LEIDEN

## 2023-07-22 LAB — PROTIME-INR
INR: 1.9 — ABNORMAL HIGH (ref 0.8–1.2)
Prothrombin Time: 21.9 s — ABNORMAL HIGH (ref 11.4–15.2)

## 2023-07-22 MED ORDER — WARFARIN SODIUM 5 MG PO TABS
15.0000 mg | ORAL_TABLET | Freq: Once | ORAL | Status: AC
  Administered 2023-07-22: 15 mg via ORAL
  Filled 2023-07-22: qty 3
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
## 2023-07-22 NOTE — Plan of Care (Signed)
  Problem: Education: Goal: Knowledge of General Education information will improve Description: Including pain rating scale, medication(s)/side effects and non-pharmacologic comfort measures 07/22/2023 2230 by Malachi Carl, RN Outcome: Progressing 07/22/2023 2229 by Malachi Carl, RN Outcome: Progressing   Problem: Health Behavior/Discharge Planning: Goal: Ability to manage health-related needs will improve 07/22/2023 2230 by Malachi Carl, RN Outcome: Progressing 07/22/2023 2229 by Malachi Carl, RN Outcome: Progressing   Problem: Clinical Measurements: Goal: Ability to maintain clinical measurements within normal limits will improve Outcome: Progressing Goal: Will remain free from infection Outcome: Progressing Goal: Diagnostic test results will improve Outcome: Progressing Goal: Respiratory complications will improve Outcome: Progressing Goal: Cardiovascular complication will be avoided 07/22/2023 2230 by Malachi Carl, RN Outcome: Progressing 07/22/2023 2229 by Malachi Carl, RN Outcome: Progressing   Problem: Activity: Goal: Risk for activity intolerance will decrease 07/22/2023 2230 by Malachi Carl, RN Outcome: Progressing 07/22/2023 2229 by Malachi Carl, RN Outcome: Progressing   Problem: Nutrition: Goal: Adequate nutrition will be maintained 07/22/2023 2230 by Malachi Carl, RN Outcome: Progressing 07/22/2023 2229 by Malachi Carl, RN Outcome: Progressing   Problem: Coping: Goal: Level of anxiety will decrease 07/22/2023 2230 by Malachi Carl, RN Outcome: Progressing 07/22/2023 2229 by Malachi Carl, RN Outcome: Progressing   Problem: Elimination: Goal: Will not experience complications related to bowel motility 07/22/2023 2230 by Malachi Carl, RN Outcome: Progressing 07/22/2023 2229 by Malachi Carl, RN Outcome: Progressing Goal: Will not experience complications related to urinary retention 07/22/2023 2230 by Malachi Carl, RN Outcome: Progressing 07/22/2023 2229 by Malachi Carl, RN Outcome:  Progressing   Problem: Pain Managment: Goal: General experience of comfort will improve 07/22/2023 2230 by Malachi Carl, RN Outcome: Progressing 07/22/2023 2229 by Malachi Carl, RN Outcome: Progressing   Problem: Safety: Goal: Ability to remain free from injury will improve 07/22/2023 2230 by Malachi Carl, RN Outcome: Progressing 07/22/2023 2229 by Malachi Carl, RN Outcome: Progressing   Problem: Skin Integrity: Goal: Risk for impaired skin integrity will decrease Outcome: Progressing

## 2023-07-22 NOTE — Progress Notes (Signed)
PROGRESS NOTE    Alexander [name]  FIE:[id number] DOB: Nov 29, 1960 DOA: 07/10/2023 PCP: Jarold Motto, PA   Brief Narrative:  The patient is a 62 year old AAM with a PMH significant for but not limited to hx of DVT, PE, not on anticoagulation (self discontinued) presenting with chest pain.  Found to have large clot burden on CTA chest was sign suggestive of right heart failure.  EDP consulted with pulmonary medicine who recommended against lytics.  Admitted to stepdown unit for IV heparin and further evaluation. Due to ongoing chest pain, tachycardia and hypoxia he was  Seen by pulmonology, after discussion with interventional radiology, was treated with catheter directed thrombolytics with dramatic improvement.  Postprocedure the patient has done well and has now been weaned off oxygen.  Unable to afford DOAC's.  Therefore started on warfarin with enoxaparin bridge.  Since he cannot afford Lovenox , he will need to remain in hospital until INR therapeutic.  His INR is almost therapeutic in the anticipate discharging in the a.m. likely as I expect his INR to be therapeutic.  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 -Large clot burden on CTA with RHS on CT.  -Echo low normal LVEF 50-55%, no WMA, grade I DD. RV systolic function mildly reduced.  Severe pulmonary hypertension. -Seen by Pulmonology and Interventional Radiology.  Catheter directed lytics initiated 10/11.  Will need lifelong anticoagulation: warfarin based on cost.   -Will need Outpatient hypercoagulability workup.  Suggest outpatient sleep study. -Repeat echo in 3  months to see if there is any improvement  -Follow-up with PCCM as an outpatient -Hypoxia resolved -Continue enoxaparin bridge, warfarin per pharmacy for now but enoxaparin injections may be too prohibitive expensive so we will need to remain inpatient until INR therapeutic; Last PT-INR was 21.9-1.9 Respectively  -Pharmacy is now giving a dose of Coumadin 15 mg x1 today  -Patient has an appointment with the Anticoagulation Clinic on 10/23   DVT (deep venous thrombosis) (HCC) -VTE Schan showing R popliteal DVT indeterminate and no DVT on Left.  History of clot in right leg. -C/w Warfarin with Enoxaparin bridge as above as above  Normocytic Anemia -Hgb/Hct relatively Stable now -Hgb/Hct Trend: Recent Labs  Lab 07/14/23 0331 07/15/23 0438 07/16/23 0506 07/17/23 0505 07/18/23 0450 07/19/23 0448 07/22/23 0441  HGB 9.9* 10.0* 9.9* 9.9* 10.2* 10.1* 10.4*  HCT 31.3* 31.6* 31.6* 31.1* 32.1* 31.8* 33.2*  MCV 92.1 92.9 93.2 91.5 92.2 92.7 91.2  -Checked Anemia Panel and showed and Iron Level of 25, UIBC was 303, TIBC was 328, Saturation Ratios of 8%, Ferritin of 25, Folate of 7.5, and Vitamin B12 of 222 -Continue to Monitor for S/Sx of Bleeding; No overt bleeding noted -Repeat CBC in the AM  Thrombocytopenia, resolved -Platelet Count Trend: Recent Labs  Lab 07/14/23 0331 07/15/23 0438 07/16/23 0506 07/17/23 0505 07/18/23 0450 07/19/23 0448 07/22/23 0441  PLT 137* 163 212 226 276 324 485*  -Continue to Monitor for S/Sx of Bleeding; no overt bleeding noted -Repeat CBC in the AM  Hypoalbuminemia -Patient's Albumin Trend: Recent Labs  Lab 07/17/23 0505 07/19/23 0448 07/22/23 0441  ALBUMIN 2.8* 3.0* 3.0*  -Continue to Monitor and Trend and repeat CMP  in the AM  Insomnia -C/w Trazodone 50 mg po at bedtime prn Sleep and also add Diphenhydramine 25 mg po at bedtime prn Sleep if Trazodone is ineffective -Also add Melatonin tab 3 mg po qHS   DVT prophylaxis:  warfarin  (COUMADIN) tablet 15 mg    Code Status: Full Code Family Communication: No family currently at bedside  Disposition Plan:  Level of care: Med-Surg Status is: Inpatient Remains inpatient appropriate because: Needs to have his INR therapeutic prior to safe discharge and anticipate D/C in the next 24 hours   Consultants:  PCCM/Pumonary Interventional Radiology  Procedures:  10/11 Bilateral pulmonary angiogram and initiation of PE lysis   Antimicrobials:  Anti-infectives (From admission, onward)    None       Subjective: Seen and examined at bedside and was feeling okay.  Denies chest pain or shortness breath.  No nausea or vomiting.  Frustrated that his INR did not move very much.  States that he ate some green leafy vegetables yesterday.  No other concerns or complaints this time.  Objective: Vitals:   07/21/23 1218 07/21/23 1918 07/22/23 0426 07/22/23 1302  BP: (!) 108/91 131/79 139/86 129/75  Pulse: 79 89 65 92  Resp: 18 17 18 18   Temp: 98.1 F (36.7 C) 98.6 F (37 C) 98 F (36.7 C) 98 F (36.7 C)  TempSrc: Oral Oral Oral Oral  SpO2: 100% 99% 100% 100%  Weight:      Height:        Intake/Output Summary (Last 24 hours) at 07/22/2023 1338 Last data filed at 07/22/2023 1312 Gross per 24 hour  Intake 720 ml  Output --  Net 720 ml   Filed Weights   07/10/23 1145 07/19/23 1500  Weight: 86.2 kg 86.2 kg   Examination: Physical Exam:  Constitutional: WN/WD overweight AAM in NAD appears calm Respiratory: Diminished to auscultation bilaterally with coarse breath sounds, no wheezing, rales, rhonchi or crackles. Normal respiratory effort and patient is not tachypenic. No accessory muscle use. Unlabored breathing  Cardiovascular: RRR, no murmurs / rubs / gallops. S1 and S2 auscultated. No extremity edema.   Abdomen: Soft, non-tender, A little distended 2/2 body habitus. Bowel sounds positive.  GU: Deferred. Musculoskeletal: No clubbing / cyanosis of digits/nails. No  joint deformity upper and lower extremities.  Skin: No rashes, lesions, ulcers on a limited skin evaluation. No induration; Warm and dry.  Neurologic: CN 2-12 grossly intact with no focal deficits. Romberg sign and cerebellar reflexes not assessed.  Psychiatric: Normal judgment and insight. Alert and oriented x 3. Normal mood and appropriate affect.   Data Reviewed: I have personally reviewed following labs and imaging studies  CBC: Recent Labs  Lab 07/16/23 0506 07/17/23 0505 07/18/23 0450 07/19/23 0448 07/22/23 0441  WBC 4.8 4.6 5.8 6.2 6.6  NEUTROABS  --   --   --  2.7 2.9  HGB 9.9* 9.9* 10.2* 10.1* 10.4*  HCT 31.6* 31.1* 32.1* 31.8* 33.2*  MCV 93.2 91.5 92.2 92.7 91.2  PLT 212 226 276 324 485*   Basic Metabolic Panel: Recent Labs  Lab 07/16/23 0506 07/17/23 0505 07/19/23 0448 07/22/23 0441  NA  --  140 136 136  K  --  4.1 4.0 3.8  CL  --  107 105 105  CO2  --  24 23 23   GLUCOSE  --  88 96 102*  BUN  --  14 18 17   CREATININE 0.88 0.89 0.78 0.91  CALCIUM  --  8.7* 8.5* 8.8*  MG  --  2.1 2.0 2.1  PHOS  --  4.5 4.4 4.7*   GFR: Estimated Creatinine Clearance: 92.7 mL/min (by C-G formula based on SCr of 0.91 mg/dL). Liver Function Tests: Recent Labs  Lab 07/17/23 0505 07/19/23 0448 07/22/23 0441  AST 31 26 33  ALT 22 28 39  ALKPHOS 43 48 50  BILITOT <0.1* 0.3 0.2*  PROT 6.5 6.8 6.9  ALBUMIN 2.8* 3.0* 3.0*   No results for input(s): "LIPASE", "AMYLASE" in the last 168 hours. No results for input(s): "AMMONIA" in the last 168 hours. Coagulation Profile: Recent Labs  Lab 07/18/23 0450 07/19/23 0448 07/20/23 0539 07/21/23 0524 07/22/23 0441  INR 1.4* 1.6* 1.7* 1.9* 1.9*   Cardiac Enzymes: No results for input(s): "CKTOTAL", "CKMB", "CKMBINDEX", "TROPONINI" in the last 168 hours. BNP (last 3 results) No results for input(s): "PROBNP" in the last 8760 hours. HbA1C: No results for input(s): "HGBA1C" in the last 72 hours. CBG: No results for input(s):  "GLUCAP" in the last 168 hours. Lipid Profile: No results for input(s): "CHOL", "HDL", "LDLCALC", "TRIG", "CHOLHDL", "LDLDIRECT" in the last 72 hours. Thyroid Function Tests: No results for input(s): "TSH", "T4TOTAL", "FREET4", "T3FREE", "THYROIDAB" in the last 72 hours. Anemia Panel: No results for input(s): "VITAMINB12", "FOLATE", "FERRITIN", "TIBC", "IRON", "RETICCTPCT" in the last 72 hours. Sepsis Labs: No results for input(s): "PROCALCITON", "LATICACIDVEN" in the last 168 hours.  No results found for this or any previous visit (from the past 240 hour(s)).   Radiology Studies: No results found.  Scheduled Meds:  enoxaparin (LOVENOX) injection  90 mg Subcutaneous Q12H   melatonin  3 mg Oral QHS   mupirocin ointment   Nasal BID   polyethylene glycol  17 g Oral BID   senna  1 tablet Oral QHS   sodium chloride flush  3 mL Intravenous Q12H   warfarin  15 mg Oral ONCE-1600   Warfarin - Pharmacist Dosing Inpatient   Does not apply q1600   Continuous Infusions:   LOS: 12 days   Marguerita Merles, DO [facility] Available via Epic secure chat 7am-7pm After these hours, please refer to coverage provider listed on [email or web address] 07/22/2023, 1:38 PM

## 2023-07-22 NOTE — TOC Transition Note (Signed)
Transition of Care [facility]) - CM/SW Discharge Note   Patient Details  Name: Alexander [name] MRN: [id number] Date of Birth: 15-Jan-1961  [facility] of [facility] CM/SW Contact:  Lanier Clam, RN Phone Number: 07/22/2023, 12:52 PM   Clinical Narrative: Ordered for cane-no preference-[employer] rep Jermaine to deliver cane to rm prior d/c. No further CM needs.      Final next level of care: Home/Self Care Barriers to Discharge: No Barriers Identified   Patient Goals and CMS Choice      Discharge Placement                         Discharge Plan and Services Additional resources added to the After Visit Summary for       Post Acute Care Choice: Durable Medical Equipment          DME Arranged: Gilmer Mor DME Agency: [employer] Date DME Agency Contacted: 07/22/23 Time DME Agency Contacted: 1251 Representative spoke with at DME Agency: Vaughan Basta            Social Determinants of Health (SDOH) Interventions SDOH Screenings   Food Insecurity: No Food Insecurity (07/10/2023)  Housing: Patient Declined (07/10/2023)  Transportation Needs: No Transportation Needs (07/10/2023)  Utilities: Not At Risk (07/10/2023)  Depression (PHQ2-9): Low Risk  (06/01/2021)  Tobacco Use: Medium Risk (07/10/2023)     Readmission Risk Interventions    07/11/2023    2:11 PM  Readmission Risk Prevention Plan  Post Dischage Appt Complete  Medication Screening Complete  Transportation Screening Complete

## 2023-07-22 NOTE — Plan of Care (Signed)
  Problem: Health Behavior/Discharge Planning: Goal: Ability to manage health-related needs will improve Outcome: Progressing   Problem: Clinical Measurements: Goal: Diagnostic test results will improve Outcome: Progressing   Problem: Nutrition: Goal: Adequate nutrition will be maintained Outcome: Progressing   Problem: Pain Managment: Goal: General experience of comfort will improve Outcome: Progressing

## 2023-07-23 ENCOUNTER — Other Ambulatory Visit (HOSPITAL_COMMUNITY): Payer: Self-pay

## 2023-07-23 DIAGNOSIS — I749 Embolism and thrombosis of unspecified artery: Secondary | ICD-10-CM | POA: Diagnosis not present

## 2023-07-23 DIAGNOSIS — I82431 Acute embolism and thrombosis of right popliteal vein: Secondary | ICD-10-CM | POA: Diagnosis not present

## 2023-07-23 DIAGNOSIS — I10 Essential (primary) hypertension: Secondary | ICD-10-CM | POA: Diagnosis not present

## 2023-07-23 DIAGNOSIS — I2781 Cor pulmonale (chronic): Secondary | ICD-10-CM | POA: Diagnosis not present

## 2023-07-23 LAB — PROTIME-INR
INR: 2.1 — ABNORMAL HIGH (ref 0.8–1.2)
Prothrombin Time: 24 s — ABNORMAL HIGH (ref 11.4–15.2)

## 2023-07-23 LAB — PROTHROMBIN GENE MUTATION

## 2023-07-23 MED ORDER — WARFARIN SODIUM 5 MG PO TABS
10.0000 mg | ORAL_TABLET | Freq: Every day | ORAL | Status: DC
  Administered 2023-07-23: 10 mg via ORAL
  Filled 2023-07-23: qty 2

## 2023-07-23 MED ORDER — SENNA 8.6 MG PO TABS
1.0000 | ORAL_TABLET | Freq: Every day | ORAL | 0 refills | Status: DC
  Filled 2023-07-23: qty 120, 120d supply, fill #0

## 2023-07-23 MED ORDER — ACETAMINOPHEN 325 MG PO TABS
650.0000 mg | ORAL_TABLET | Freq: Four times a day (QID) | ORAL | 0 refills | Status: DC | PRN
  Filled 2023-07-23: qty 20, 3d supply, fill #0

## 2023-07-23 MED ORDER — ONDANSETRON HCL 4 MG PO TABS
4.0000 mg | ORAL_TABLET | Freq: Four times a day (QID) | ORAL | 0 refills | Status: DC | PRN
Start: 2023-07-23 — End: 2023-12-03
  Filled 2023-07-23: qty 18, 4d supply, fill #0

## 2023-07-23 MED ORDER — POLYETHYLENE GLYCOL 3350 17 GM/SCOOP PO POWD
17.0000 g | Freq: Every day | ORAL | 0 refills | Status: DC
Start: 2023-07-23 — End: 2024-02-10
  Filled 2023-07-23: qty 238, 14d supply, fill #0

## 2023-07-23 MED ORDER — WARFARIN SODIUM 5 MG PO TABS
10.0000 mg | ORAL_TABLET | Freq: Every day | ORAL | 0 refills | Status: DC
Start: 2023-07-23 — End: 2023-08-02
  Filled 2023-07-23: qty 60, 30d supply, fill #0

## 2023-07-23 NOTE — Discharge Summary (Signed)
Physician Discharge Summary   Patient: Alexander [name] MRN: [id number] DOB: 11/13/1960  Admit date:     07/10/2023  Discharge date: 07/23/23  Discharge Physician: Marguerita Merles, DO   PCP: Jarold Motto, PA   Recommendations at discharge:   Follow-up with PCP within 1-2 weeks and repeat CBC, CMP, mag, Phos within 1 week Follow-up with the Coumadin clinic after PCPs clinic as appointment has been scheduled for you for 07/24/2023 at 9 AM  Discharge Diagnoses: Principal Problem:   Acute pulmonary embolism (HCC) Active Problems:   DVT (deep venous thrombosis) (HCC)   Essential hypertension, benign   Pulmonary hypertension (HCC)   Cor pulmonale (HCC)   Chronic thromboembolic disease (HCC)   Thrombocytopenia (HCC)  Resolved Problems:   Acute pulmonary embolism with acute cor pulmonale [facility])  Hospital Course: The patient is a 62 year old AAM with a PMH significant for but not limited to hx of DVT, PE, not on anticoagulation (self discontinued) presenting with chest pain.  Found to have large clot burden on CTA chest was sign suggestive of right heart failure.  EDP consulted with pulmonary medicine who recommended against lytics.  Admitted to stepdown unit for IV heparin and further evaluation. Due to ongoing chest pain, tachycardia and hypoxia he was  Seen by pulmonology, after discussion with interventional radiology, was treated with catheter directed thrombolytics with dramatic improvement.  Postprocedure the patient has done well and has now been weaned off oxygen.  Unable to afford DOAC's.  Therefore started on warfarin with enoxaparin bridge.    Since he could not afford Lovenox , he will need to remain in hospital until INR therapeutic and it took the entire week but his INR is not therapeutic at 2.1.  He is improved and medically stable for discharge and he will need Close outpatient follow-up with Warfarin clinic at D/C and appointment is scheduled for 10/23 at 9 AM with Jarold Motto his PCP.   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 -Large clot burden on CTA with RHS on CT.  -Echo low normal LVEF 50-55%, no WMA, grade I DD. RV systolic function mildly reduced.  Severe pulmonary hypertension. -Seen by Pulmonology and Interventional Radiology.  Catheter directed lytics initiated 10/11.  Will need lifelong anticoagulation: warfarin based on cost.   -Will need Outpatient hypercoagulability workup.  Suggest outpatient sleep study. -Repeat echo in 3 months to see if there is any improvement  -Follow-up with PCCM as an outpatient -Hypoxia resolved -Continue enoxaparin bridge, warfarin per pharmacy for now but enoxaparin injections may be too prohibitive expensive so we will need to remain inpatient until INR therapeutic; Last PT-INR was 24.0-2.1 Respectively  -Pharmacy is now giving a dose of Coumadin 15 mg x1 yesterday and will be discharged on Coumadin 10 mg p.o. daily -Patient has an appointment with the Anticoagulation Clinic on 10/23   DVT (deep venous thrombosis) (HCC) -VTE Schan showing R popliteal DVT indeterminate and no DVT on Left.  History of clot in right leg. -Completed warfarin enoxaparin bridge doses be discharged on Coumadin 10 mg daily with further adjustment per his PCP  Normocytic Anemia -Hgb/Hct relatively Stable now -Hgb/Hct Trend: Recent Labs  Lab 07/14/23 0331 07/15/23 0438 07/16/23 0506 07/17/23 0505 07/18/23 0450 07/19/23 0448 07/22/23 0441  HGB 9.9* 10.0* 9.9* 9.9* 10.2*  10.1* 10.4*  HCT 31.3* 31.6* 31.6* 31.1* 32.1* 31.8* 33.2*  MCV 92.1 92.9 93.2 91.5 92.2 92.7 91.2  -Checked Anemia Panel and showed and Iron Level of 25, UIBC was 303, TIBC was 328, Saturation  Ratios of 8%, Ferritin of 25, Folate of 7.5, and Vitamin B12 of 222 -Continue to Monitor for S/Sx of Bleeding; No overt bleeding noted -Repeat CBC within 1 week  Thrombocytopenia, resolved -Platelet Count Trend: Recent Labs  Lab 07/14/23 0331 07/15/23 0438 07/16/23 0506 07/17/23 0505 07/18/23 0450 07/19/23 0448 07/22/23 0441  PLT 137* 163 212 226 276 324 485*  -Continue to Monitor for S/Sx of Bleeding; no overt bleeding noted -Repeat CBC within 1 week  Hypoalbuminemia -Patient's Albumin Trend: Recent Labs  Lab 07/17/23 0505 07/19/23 0448 07/22/23 0441  ALBUMIN 2.8* 3.0* 3.0*  -Continue to Monitor and Trend and repeat CMP in the AM  Insomnia -C/w Trazodone 50 mg po at bedtime prn Sleep and also add Diphenhydramine 25 mg po at bedtime prn Sleep if Trazodone is ineffective -Also add Melatonin tab 3 mg po qHS while he is hospitalized  Consultants: PCCM/pulmonary and interventional radiology Procedures performed: On 07/12/2023 had bilateral pulmonary angiogram and initiation of PE lysis Disposition: Home Diet recommendation:  Discharge Diet Orders (From admission, onward)     Start     Ordered   07/23/23 0000  Diet - low sodium heart healthy        07/23/23 1038           Cardiac diet DISCHARGE MEDICATION: Allergies as of 07/23/2023   No Known Allergies      Medication List     STOP taking these medications    allopurinol 100 MG tablet Commonly known as: ZYLOPRIM   bacitracin ointment   cephALEXin 500 MG capsule Commonly known as: KEFLEX   gabapentin 100 MG capsule Commonly known as: Neurontin   methylPREDNISolone 4 MG Tbpk tablet Commonly known as: MEDROL DOSEPAK   metoprolol tartrate 50 MG tablet Commonly known as: LOPRESSOR   mupirocin cream 2 % Commonly known as: BACTROBAN   naproxen 500 MG tablet Commonly known as: NAPROSYN   rivaroxaban 20 MG Tabs tablet Commonly known as: XARELTO   tadalafil 20 MG tablet Commonly known as:  CIALIS       TAKE these medications    acetaminophen 325 MG tablet Commonly known as: TYLENOL Take 2 tablets (650 mg total) by mouth every 6 (six) hours as needed for mild pain (pain score 1-3) (or Fever >/= 101). Notes to patient: 07/23/23, every 6 hours as needed   ondansetron 4 MG tablet Commonly known as: ZOFRAN Take 1 tablet (4 mg total) by mouth every 6 (six) hours as needed for nausea.   polyethylene glycol powder 17 GM/SCOOP powder Commonly known as: GLYCOLAX/MIRALAX Mix as directed and take 17 g by mouth daily.   senna 8.6 MG Tabs tablet Commonly known as: SENOKOT Take 1 tablet (8.6 mg total) by mouth at bedtime.   warfarin 5 MG tablet Commonly known as: COUMADIN Take 2 tablets (10 mg total) by mouth daily at 4 PM.        Follow-up Information     [facility] Follow up on 07/24/2023.   Why: 9a coumadin clinic check with Jarold Motto Contact information: [address] [place], [place] [address]   [phone]        [facility] Follow up.   Why: cane Contact information: [address]  [place] [address]  [phone]               Discharge Exam: Filed Weights   07/10/23 1145 07/19/23 1500  Weight: 86.2 kg 86.2 kg   Vitals:   07/22/23 2023 07/23/23 0522  BP: 131/77 133/87  Pulse: 93 74  Resp: 18 18  Temp: 98.2 F (36.8 C) 98.5 F (36.9 C)  SpO2: 97% 100%   Examination: Physical Exam:  Constitutional: WN/WD overweight AAM in NAD appears calm Respiratory: Diminished to auscultation bilaterally, no wheezing, rales, rhonchi or crackles. Normal respiratory effort and patient is not tachypenic. No accessory muscle use. Unlabored breathing Cardiovascular: RRR, no murmurs / rubs / gallops. S1 and S2 auscultated. No extremity edema.  Abdomen: Soft, non-tender, Distended 2/2 body habitus. Bowel sounds positive.  GU: Deferred. Musculoskeletal: No clubbing / cyanosis of digits/nails. No joint deformity upper and  lower extremities.  Skin: No rashes, lesions, ulcers on a limited skin evaluation. No induration; Warm and dry.  Neurologic: CN 2-12 grossly intact with no focal deficits. Romberg sign cerebellar reflexes not assessed.  Psychiatric: Normal judgment and insight. Alert and oriented x 3. Normal mood and appropriate affect.   Condition at discharge: stable  The results of significant diagnostics from this hospitalization (including imaging, microbiology, ancillary and laboratory) are listed below for reference.   Imaging Studies: IR THROMB F/U EVAL ART/VEN FINAL DAY (MS)  Result Date: 07/13/2023 INDICATION: Intermediate high risk PE undergoing bilateral pulmonary arterial thrombolysis. EXAM: IR THROMB F/U EVAL ART/VEN FINAL DAY COMPARISON:  Initiation of PE lysis performed yesterday MEDICATIONS: None. ANESTHESIA/SEDATION: None. FLUOROSCOPY TIME:  None. COMPLICATIONS: None immediate. TECHNIQUE: Bilateral pulmonary arterial pressures were obtained. The lysis catheters and sheaths were then removed. Hemostasis was attained by manual pressure. FINDINGS: Right main pulmonary artery pressure: 34/24 (28) mm Hg Left main pulmonary artery pressure: 44/13 (26) mm Hg IMPRESSION: 1. Successful completion of bilateral pulmonary arterial thrombolysis. 2. Bilateral pulmonary arterial hypertension. Electronically Signed   By: Malachy Moan M.D.   On: 07/13/2023 11:03   IR Angiogram Pulmonary Bilateral Selective  Result Date: 07/12/2023 INDICATION: 62 year old male with submassive (intermediate high risk) PE. He continues to suffer with dyspnea despite 2 days of intravenous heparin therapy. Therefore, he is a candidate for percutaneous thrombolysis. EXAM: BILATERAL PULMONARY ARTERIOGRAPHY; IR INFUSION THROMBOL ARTERIAL INITIAL (MS); ADDITIONAL ARTERIOGRAPHY; IR ULTRASOUND GUIDANCE VASC ACCESS RIGHT COMPARISON:  CT a chest 07/10/2023 MEDICATIONS: 4 mg tPA injected directly into the pulmonary arteries, 2 mg on the  right and 2 mg on the left ANESTHESIA/SEDATION: 50 mcg fentanyl administered during the course of the procedure for pain control. This does not constitute moderate sedation. FLUOROSCOPY TIME:  Radiation exposure index: 31.5 mGy reference air kerma COMPLICATIONS: None immediate. TECHNIQUE: Informed written consent was obtained from the patient after a thorough discussion of the procedural risks, benefits and alternatives. All questions were addressed. Maximal Sterile Barrier Technique was utilized including caps, mask, sterile gowns, sterile gloves, sterile drape, hand hygiene and skin antiseptic. A timeout was performed prior to the initiation of the procedure. The right common femoral vein was interrogated with ultrasound and found to be widely patent. An image was obtained and stored for the medical record. Local anesthesia was attained by infiltration with 1% lidocaine. Under real-time sonographic guidance, the vessel was punctured with a 21 gauge micropuncture needle. Using standard technique, the initial micro needle was exchanged over a 0.018 micro wire for a transitional 4 [place] micro sheath. Using the same technique, a second access was obtained  with a separate 21 gauge micro puncture needle. Using standard technique, the micro needle was exchanged over a 0.018 microwire for a transitional 4 [place] micro sheath. The micro sheath were then exchanged over Bentson wires for dual 6 French vascular sheaths. Through the more medial sheath, a 6 French angled pigtail catheter was advanced over the Bentson wire. The pigtail catheter was navigated through the heart and into the main pulmonary artery. A pulmonary arteriogram was performed. Diminished pulmonary flow is visible bilaterally. Thrombus in both lower lobe pulmonary arteries. An attempt to measure pulmonary arterial pressures was made, however the pressure measuring appointment was not accessible. Therefore, the decision was made to move forward without  pressures. The pigtail catheter was exchanged over a Rosen wire for a vert catheter. The vert catheter was used to select the left lower lobe pulmonary artery. A left lower lobe pulmonary angiogram was performed confirming multiple filling defects in the left lower lobe pulmonary artery consistent with PE. The vert catheter was carefully advanced distally into the left pulmonary artery. A rose in wire was placed. The catheter was removed. Next, the angled pigtail catheter was introduced through the more lateral 6 French sheath. As before, the catheter was carefully navigated through the heart and into the right pulmonary artery. Contrast injection confirms nonocclusive thrombus within the right lower lobe pulmonary artery. A rose in wire was advanced into the distal right lower lobe pulmonary artery. A 90 cm 10 cm infusion length UniFuse infusion catheter was next advanced over the wire and positioned in the right main and right lower lobe pulmonary artery. 2 mg of tPA was infused through the catheter. Next, a 90 cm 5 cm infusion length UniFuse infusion catheter was advanced over the more medial rose in wire and positioned in the left lower lobe pulmonary artery at the site of thrombus. 2 mg of tPA was infused through the catheter. Both 6 French sheaths were secured to the skin with 0 Prolene suture. The infusion catheters were further secured to the sheaths. Sterile bandages were applied. Bilateral pulmonary artery thrombolysis was initiated. FINDINGS: Pulmonary arteriography demonstrating bilateral lower lobe pulmonary emboli. IMPRESSION: Successful initiation of bilateral pulmonary arterial catheter directed thrombolysis. Electronically Signed   By: Malachy Moan M.D.   On: 07/12/2023 18:43   IR US Guide Vasc Access Right  Result Date: 07/12/2023 INDICATION: 62 year old male with submassive (intermediate high risk) PE. He continues to suffer with dyspnea despite 2 days of intravenous heparin therapy.  Therefore, he is a candidate for percutaneous thrombolysis. EXAM: BILATERAL PULMONARY ARTERIOGRAPHY; IR INFUSION THROMBOL ARTERIAL INITIAL (MS); ADDITIONAL ARTERIOGRAPHY; IR ULTRASOUND GUIDANCE VASC ACCESS RIGHT COMPARISON:  CT a chest 07/10/2023 MEDICATIONS: 4 mg tPA injected directly into the pulmonary arteries, 2 mg on the right and 2 mg on the left ANESTHESIA/SEDATION: 50 mcg fentanyl administered during the course of the procedure for pain control. This does not constitute moderate sedation. FLUOROSCOPY TIME:  Radiation exposure index: 31.5 mGy reference air kerma COMPLICATIONS: None immediate. TECHNIQUE: Informed written consent was obtained from the patient after a thorough discussion of the procedural risks, benefits and alternatives. All questions were addressed. Maximal Sterile Barrier Technique was utilized including caps, mask, sterile gowns, sterile gloves, sterile drape, hand hygiene and skin antiseptic. A timeout was performed prior to the initiation of the procedure. The right common femoral vein was interrogated with ultrasound and found to be widely patent. An image was obtained and stored for the medical record. Local anesthesia was attained by infiltration with 1%  lidocaine. Under real-time sonographic guidance, the vessel was punctured with a 21 gauge micropuncture needle. Using standard technique, the initial micro needle was exchanged over a 0.018 micro wire for a transitional 4 [place] micro sheath. Using the same technique, a second access was obtained with a separate 21 gauge micro puncture needle. Using standard technique, the micro needle was exchanged over a 0.018 microwire for a transitional 4 [place] micro sheath. The micro sheath were then exchanged over Bentson wires for dual 6 French vascular sheaths. Through the more medial sheath, a 6 French angled pigtail catheter was advanced over the Bentson wire. The pigtail catheter was navigated through the heart and into the main pulmonary  artery. A pulmonary arteriogram was performed. Diminished pulmonary flow is visible bilaterally. Thrombus in both lower lobe pulmonary arteries. An attempt to measure pulmonary arterial pressures was made, however the pressure measuring appointment was not accessible. Therefore, the decision was made to move forward without pressures. The pigtail catheter was exchanged over a Rosen wire for a vert catheter. The vert catheter was used to select the left lower lobe pulmonary artery. A left lower lobe pulmonary angiogram was performed confirming multiple filling defects in the left lower lobe pulmonary artery consistent with PE. The vert catheter was carefully advanced distally into the left pulmonary artery. A rose in wire was placed. The catheter was removed. Next, the angled pigtail catheter was introduced through the more lateral 6 French sheath. As before, the catheter was carefully navigated through the heart and into the right pulmonary artery. Contrast injection confirms nonocclusive thrombus within the right lower lobe pulmonary artery. A rose in wire was advanced into the distal right lower lobe pulmonary artery. A 90 cm 10 cm infusion length UniFuse infusion catheter was next advanced over the wire and positioned in the right main and right lower lobe pulmonary artery. 2 mg of tPA was infused through the catheter. Next, a 90 cm 5 cm infusion length UniFuse infusion catheter was advanced over the more medial rose in wire and positioned in the left lower lobe pulmonary artery at the site of thrombus. 2 mg of tPA was infused through the catheter. Both 6 French sheaths were secured to the skin with 0 Prolene suture. The infusion catheters were further secured to the sheaths. Sterile bandages were applied. Bilateral pulmonary artery thrombolysis was initiated. FINDINGS: Pulmonary arteriography demonstrating bilateral lower lobe pulmonary emboli. IMPRESSION: Successful initiation of bilateral pulmonary arterial  catheter directed thrombolysis. Electronically Signed   By: Malachy Moan M.D.   On: 07/12/2023 18:43   IR INFUSION THROMBOL ARTERIAL INITIAL (MS)  Result Date: 07/12/2023 INDICATION: 62 year old male with submassive (intermediate high risk) PE. He continues to suffer with dyspnea despite 2 days of intravenous heparin therapy. Therefore, he is a candidate for percutaneous thrombolysis. EXAM: BILATERAL PULMONARY ARTERIOGRAPHY; IR INFUSION THROMBOL ARTERIAL INITIAL (MS); ADDITIONAL ARTERIOGRAPHY; IR ULTRASOUND GUIDANCE VASC ACCESS RIGHT COMPARISON:  CT a chest 07/10/2023 MEDICATIONS: 4 mg tPA injected directly into the pulmonary arteries, 2 mg on the right and 2 mg on the left ANESTHESIA/SEDATION: 50 mcg fentanyl administered during the course of the procedure for pain control. This does not constitute moderate sedation. FLUOROSCOPY TIME:  Radiation exposure index: 31.5 mGy reference air kerma COMPLICATIONS: None immediate. TECHNIQUE: Informed written consent was obtained from the patient after a thorough discussion of the procedural risks, benefits and alternatives. All questions were addressed. Maximal Sterile Barrier Technique was utilized including caps, mask, sterile gowns, sterile gloves, sterile drape, hand hygiene and skin  antiseptic. A timeout was performed prior to the initiation of the procedure. The right common femoral vein was interrogated with ultrasound and found to be widely patent. An image was obtained and stored for the medical record. Local anesthesia was attained by infiltration with 1% lidocaine. Under real-time sonographic guidance, the vessel was punctured with a 21 gauge micropuncture needle. Using standard technique, the initial micro needle was exchanged over a 0.018 micro wire for a transitional 4 [place] micro sheath. Using the same technique, a second access was obtained with a separate 21 gauge micro puncture needle. Using standard technique, the micro needle was exchanged over  a 0.018 microwire for a transitional 4 [place] micro sheath. The micro sheath were then exchanged over Bentson wires for dual 6 French vascular sheaths. Through the more medial sheath, a 6 French angled pigtail catheter was advanced over the Bentson wire. The pigtail catheter was navigated through the heart and into the main pulmonary artery. A pulmonary arteriogram was performed. Diminished pulmonary flow is visible bilaterally. Thrombus in both lower lobe pulmonary arteries. An attempt to measure pulmonary arterial pressures was made, however the pressure measuring appointment was not accessible. Therefore, the decision was made to move forward without pressures. The pigtail catheter was exchanged over a Rosen wire for a vert catheter. The vert catheter was used to select the left lower lobe pulmonary artery. A left lower lobe pulmonary angiogram was performed confirming multiple filling defects in the left lower lobe pulmonary artery consistent with PE. The vert catheter was carefully advanced distally into the left pulmonary artery. A rose in wire was placed. The catheter was removed. Next, the angled pigtail catheter was introduced through the more lateral 6 French sheath. As before, the catheter was carefully navigated through the heart and into the right pulmonary artery. Contrast injection confirms nonocclusive thrombus within the right lower lobe pulmonary artery. A rose in wire was advanced into the distal right lower lobe pulmonary artery. A 90 cm 10 cm infusion length UniFuse infusion catheter was next advanced over the wire and positioned in the right main and right lower lobe pulmonary artery. 2 mg of tPA was infused through the catheter. Next, a 90 cm 5 cm infusion length UniFuse infusion catheter was advanced over the more medial rose in wire and positioned in the left lower lobe pulmonary artery at the site of thrombus. 2 mg of tPA was infused through the catheter. Both 6 French sheaths were secured  to the skin with 0 Prolene suture. The infusion catheters were further secured to the sheaths. Sterile bandages were applied. Bilateral pulmonary artery thrombolysis was initiated. FINDINGS: Pulmonary arteriography demonstrating bilateral lower lobe pulmonary emboli. IMPRESSION: Successful initiation of bilateral pulmonary arterial catheter directed thrombolysis. Electronically Signed   By: Malachy Moan M.D.   On: 07/12/2023 18:43   IR INFUSION THROMBOL ARTERIAL INITIAL (MS)  Result Date: 07/12/2023 INDICATION: 62 year old male with submassive (intermediate high risk) PE. He continues to suffer with dyspnea despite 2 days of intravenous heparin therapy. Therefore, he is a candidate for percutaneous thrombolysis. EXAM: BILATERAL PULMONARY ARTERIOGRAPHY; IR INFUSION THROMBOL ARTERIAL INITIAL (MS); ADDITIONAL ARTERIOGRAPHY; IR ULTRASOUND GUIDANCE VASC ACCESS RIGHT COMPARISON:  CT a chest 07/10/2023 MEDICATIONS: 4 mg tPA injected directly into the pulmonary arteries, 2 mg on the right and 2 mg on the left ANESTHESIA/SEDATION: 50 mcg fentanyl administered during the course of the procedure for pain control. This does not constitute moderate sedation. FLUOROSCOPY TIME:  Radiation exposure index: 31.5 mGy reference air kerma COMPLICATIONS:  None immediate. TECHNIQUE: Informed written consent was obtained from the patient after a thorough discussion of the procedural risks, benefits and alternatives. All questions were addressed. Maximal Sterile Barrier Technique was utilized including caps, mask, sterile gowns, sterile gloves, sterile drape, hand hygiene and skin antiseptic. A timeout was performed prior to the initiation of the procedure. The right common femoral vein was interrogated with ultrasound and found to be widely patent. An image was obtained and stored for the medical record. Local anesthesia was attained by infiltration with 1% lidocaine. Under real-time sonographic guidance, the vessel was punctured  with a 21 gauge micropuncture needle. Using standard technique, the initial micro needle was exchanged over a 0.018 micro wire for a transitional 4 [place] micro sheath. Using the same technique, a second access was obtained with a separate 21 gauge micro puncture needle. Using standard technique, the micro needle was exchanged over a 0.018 microwire for a transitional 4 [place] micro sheath. The micro sheath were then exchanged over Bentson wires for dual 6 French vascular sheaths. Through the more medial sheath, a 6 French angled pigtail catheter was advanced over the Bentson wire. The pigtail catheter was navigated through the heart and into the main pulmonary artery. A pulmonary arteriogram was performed. Diminished pulmonary flow is visible bilaterally. Thrombus in both lower lobe pulmonary arteries. An attempt to measure pulmonary arterial pressures was made, however the pressure measuring appointment was not accessible. Therefore, the decision was made to move forward without pressures. The pigtail catheter was exchanged over a Rosen wire for a vert catheter. The vert catheter was used to select the left lower lobe pulmonary artery. A left lower lobe pulmonary angiogram was performed confirming multiple filling defects in the left lower lobe pulmonary artery consistent with PE. The vert catheter was carefully advanced distally into the left pulmonary artery. A rose in wire was placed. The catheter was removed. Next, the angled pigtail catheter was introduced through the more lateral 6 French sheath. As before, the catheter was carefully navigated through the heart and into the right pulmonary artery. Contrast injection confirms nonocclusive thrombus within the right lower lobe pulmonary artery. A rose in wire was advanced into the distal right lower lobe pulmonary artery. A 90 cm 10 cm infusion length UniFuse infusion catheter was next advanced over the wire and positioned in the right main and right lower lobe  pulmonary artery. 2 mg of tPA was infused through the catheter. Next, a 90 cm 5 cm infusion length UniFuse infusion catheter was advanced over the more medial rose in wire and positioned in the left lower lobe pulmonary artery at the site of thrombus. 2 mg of tPA was infused through the catheter. Both 6 French sheaths were secured to the skin with 0 Prolene suture. The infusion catheters were further secured to the sheaths. Sterile bandages were applied. Bilateral pulmonary artery thrombolysis was initiated. FINDINGS: Pulmonary arteriography demonstrating bilateral lower lobe pulmonary emboli. IMPRESSION: Successful initiation of bilateral pulmonary arterial catheter directed thrombolysis. Electronically Signed   By: Malachy Moan M.D.   On: 07/12/2023 18:43   IR Angiogram Selective Each Additional Vessel  Result Date: 07/12/2023 INDICATION: 62 year old male with submassive (intermediate high risk) PE. He continues to suffer with dyspnea despite 2 days of intravenous heparin therapy. Therefore, he is a candidate for percutaneous thrombolysis. EXAM: BILATERAL PULMONARY ARTERIOGRAPHY; IR INFUSION THROMBOL ARTERIAL INITIAL (MS); ADDITIONAL ARTERIOGRAPHY; IR ULTRASOUND GUIDANCE VASC ACCESS RIGHT COMPARISON:  CT a chest 07/10/2023 MEDICATIONS: 4 mg tPA injected directly into the  pulmonary arteries, 2 mg on the right and 2 mg on the left ANESTHESIA/SEDATION: 50 mcg fentanyl administered during the course of the procedure for pain control. This does not constitute moderate sedation. FLUOROSCOPY TIME:  Radiation exposure index: 31.5 mGy reference air kerma COMPLICATIONS: None immediate. TECHNIQUE: Informed written consent was obtained from the patient after a thorough discussion of the procedural risks, benefits and alternatives. All questions were addressed. Maximal Sterile Barrier Technique was utilized including caps, mask, sterile gowns, sterile gloves, sterile drape, hand hygiene and skin antiseptic. A timeout  was performed prior to the initiation of the procedure. The right common femoral vein was interrogated with ultrasound and found to be widely patent. An image was obtained and stored for the medical record. Local anesthesia was attained by infiltration with 1% lidocaine. Under real-time sonographic guidance, the vessel was punctured with a 21 gauge micropuncture needle. Using standard technique, the initial micro needle was exchanged over a 0.018 micro wire for a transitional 4 [place] micro sheath. Using the same technique, a second access was obtained with a separate 21 gauge micro puncture needle. Using standard technique, the micro needle was exchanged over a 0.018 microwire for a transitional 4 [place] micro sheath. The micro sheath were then exchanged over Bentson wires for dual 6 French vascular sheaths. Through the more medial sheath, a 6 French angled pigtail catheter was advanced over the Bentson wire. The pigtail catheter was navigated through the heart and into the main pulmonary artery. A pulmonary arteriogram was performed. Diminished pulmonary flow is visible bilaterally. Thrombus in both lower lobe pulmonary arteries. An attempt to measure pulmonary arterial pressures was made, however the pressure measuring appointment was not accessible. Therefore, the decision was made to move forward without pressures. The pigtail catheter was exchanged over a Rosen wire for a vert catheter. The vert catheter was used to select the left lower lobe pulmonary artery. A left lower lobe pulmonary angiogram was performed confirming multiple filling defects in the left lower lobe pulmonary artery consistent with PE. The vert catheter was carefully advanced distally into the left pulmonary artery. A rose in wire was placed. The catheter was removed. Next, the angled pigtail catheter was introduced through the more lateral 6 French sheath. As before, the catheter was carefully navigated through the heart and into the right  pulmonary artery. Contrast injection confirms nonocclusive thrombus within the right lower lobe pulmonary artery. A rose in wire was advanced into the distal right lower lobe pulmonary artery. A 90 cm 10 cm infusion length UniFuse infusion catheter was next advanced over the wire and positioned in the right main and right lower lobe pulmonary artery. 2 mg of tPA was infused through the catheter. Next, a 90 cm 5 cm infusion length UniFuse infusion catheter was advanced over the more medial rose in wire and positioned in the left lower lobe pulmonary artery at the site of thrombus. 2 mg of tPA was infused through the catheter. Both 6 French sheaths were secured to the skin with 0 Prolene suture. The infusion catheters were further secured to the sheaths. Sterile bandages were applied. Bilateral pulmonary artery thrombolysis was initiated. FINDINGS: Pulmonary arteriography demonstrating bilateral lower lobe pulmonary emboli. IMPRESSION: Successful initiation of bilateral pulmonary arterial catheter directed thrombolysis. Electronically Signed   By: Malachy Moan M.D.   On: 07/12/2023 18:43   IR Angiogram Selective Each Additional Vessel  Result Date: 07/12/2023 INDICATION: 62 year old male with submassive (intermediate high risk) PE. He continues to suffer with dyspnea despite 2  days of intravenous heparin therapy. Therefore, he is a candidate for percutaneous thrombolysis. EXAM: BILATERAL PULMONARY ARTERIOGRAPHY; IR INFUSION THROMBOL ARTERIAL INITIAL (MS); ADDITIONAL ARTERIOGRAPHY; IR ULTRASOUND GUIDANCE VASC ACCESS RIGHT COMPARISON:  CT a chest 07/10/2023 MEDICATIONS: 4 mg tPA injected directly into the pulmonary arteries, 2 mg on the right and 2 mg on the left ANESTHESIA/SEDATION: 50 mcg fentanyl administered during the course of the procedure for pain control. This does not constitute moderate sedation. FLUOROSCOPY TIME:  Radiation exposure index: 31.5 mGy reference air kerma COMPLICATIONS: None immediate.  TECHNIQUE: Informed written consent was obtained from the patient after a thorough discussion of the procedural risks, benefits and alternatives. All questions were addressed. Maximal Sterile Barrier Technique was utilized including caps, mask, sterile gowns, sterile gloves, sterile drape, hand hygiene and skin antiseptic. A timeout was performed prior to the initiation of the procedure. The right common femoral vein was interrogated with ultrasound and found to be widely patent. An image was obtained and stored for the medical record. Local anesthesia was attained by infiltration with 1% lidocaine. Under real-time sonographic guidance, the vessel was punctured with a 21 gauge micropuncture needle. Using standard technique, the initial micro needle was exchanged over a 0.018 micro wire for a transitional 4 [place] micro sheath. Using the same technique, a second access was obtained with a separate 21 gauge micro puncture needle. Using standard technique, the micro needle was exchanged over a 0.018 microwire for a transitional 4 [place] micro sheath. The micro sheath were then exchanged over Bentson wires for dual 6 French vascular sheaths. Through the more medial sheath, a 6 French angled pigtail catheter was advanced over the Bentson wire. The pigtail catheter was navigated through the heart and into the main pulmonary artery. A pulmonary arteriogram was performed. Diminished pulmonary flow is visible bilaterally. Thrombus in both lower lobe pulmonary arteries. An attempt to measure pulmonary arterial pressures was made, however the pressure measuring appointment was not accessible. Therefore, the decision was made to move forward without pressures. The pigtail catheter was exchanged over a Rosen wire for a vert catheter. The vert catheter was used to select the left lower lobe pulmonary artery. A left lower lobe pulmonary angiogram was performed confirming multiple filling defects in the left lower lobe pulmonary  artery consistent with PE. The vert catheter was carefully advanced distally into the left pulmonary artery. A rose in wire was placed. The catheter was removed. Next, the angled pigtail catheter was introduced through the more lateral 6 French sheath. As before, the catheter was carefully navigated through the heart and into the right pulmonary artery. Contrast injection confirms nonocclusive thrombus within the right lower lobe pulmonary artery. A rose in wire was advanced into the distal right lower lobe pulmonary artery. A 90 cm 10 cm infusion length UniFuse infusion catheter was next advanced over the wire and positioned in the right main and right lower lobe pulmonary artery. 2 mg of tPA was infused through the catheter. Next, a 90 cm 5 cm infusion length UniFuse infusion catheter was advanced over the more medial rose in wire and positioned in the left lower lobe pulmonary artery at the site of thrombus. 2 mg of tPA was infused through the catheter. Both 6 French sheaths were secured to the skin with 0 Prolene suture. The infusion catheters were further secured to the sheaths. Sterile bandages were applied. Bilateral pulmonary artery thrombolysis was initiated. FINDINGS: Pulmonary arteriography demonstrating bilateral lower lobe pulmonary emboli. IMPRESSION: Successful initiation of bilateral pulmonary arterial catheter  directed thrombolysis. Electronically Signed   By: Malachy Moan M.D.   On: 07/12/2023 18:43   DG Chest Port 1 View  Result Date: 07/12/2023 CLINICAL DATA:  Shortness of breath. EXAM: PORTABLE CHEST 1 VIEW COMPARISON:  07/10/2023 FINDINGS: Low lung volumes. Few densities at the left lung base could represent atelectasis. Cardiomediastinal silhouette is within normal limits and stable. Negative for pneumothorax. IMPRESSION: 1. Low lung volumes with left basilar densities. These left basilar densities may represent atelectasis. Electronically Signed   By: Richarda Overlie M.D.   On:  07/12/2023 14:47   ECHOCARDIOGRAM COMPLETE  Result Date: 07/11/2023    ECHOCARDIOGRAM REPORT   Patient Name:   LOTUS MCCLENAGHAN Date of Exam: 07/11/2023 Medical Rec #:  [id number]     Height:       69.0 in Accession #:    [id number]    Weight:       190.0 lb Date of Birth:  1961-04-03     BSA:          2.021 m Patient Age:    62 years      BP:           147/89 mmHg Patient Gender: M             HR:           93 bpm. Exam Location:  Inpatient Procedure: 2D Echo, Cardiac Doppler, Color Doppler and Intracardiac            Opacification Agent Indications:    Pulmonary Embolus I26.09  History:        Patient has prior history of Echocardiogram examinations, most                 recent 01/15/2020. PAD; Risk Factors:Hypertension and                 Dyslipidemia.  Sonographer:    Lucendia Herrlich RCS Referring Phys: [phone] JARED M GARDNER IMPRESSIONS  1. Left ventricular ejection fraction, by estimation, is 50 to 55%. The left ventricle has low normal function. The left ventricle has no regional wall motion abnormalities. Left ventricular diastolic parameters are consistent with Grade I diastolic dysfunction (impaired relaxation). There is the interventricular septum is flattened in diastole ('D' shaped left ventricle), consistent with right ventricular volume overload.  2. Right ventricular systolic function is mildly reduced. The right ventricular size is normal. There is severely elevated pulmonary artery systolic pressure.  3. The mitral valve is normal in structure. No evidence of mitral valve regurgitation. No evidence of mitral stenosis.  4. The aortic valve is tricuspid. Aortic valve regurgitation is trivial. No aortic stenosis is present.  5. The inferior vena cava is dilated in size with >50% respiratory variability, suggesting right atrial pressure of 8 mmHg. FINDINGS  Left Ventricle: Left ventricular ejection fraction, by estimation, is 50 to 55%. The left ventricle has low normal function. The left ventricle has  no regional wall motion abnormalities. Definity contrast agent was given IV to delineate the left ventricular endocardial borders. The left ventricular internal cavity size was normal in size. There is no left ventricular hypertrophy. The interventricular septum is flattened in diastole ('D' shaped left ventricle), consistent with right ventricular volume overload. Left ventricular diastolic parameters are consistent with Grade I diastolic dysfunction (impaired relaxation). Normal left ventricular filling pressure. Right Ventricle: The right ventricular size is normal. No increase in right ventricular wall thickness. Right ventricular systolic function is mildly reduced. There is severely elevated pulmonary artery  systolic pressure. The tricuspid regurgitant velocity is 3.96 m/s, and with an assumed right atrial pressure of 8 mmHg, the estimated right ventricular systolic pressure is 70.7 mmHg. Left Atrium: Left atrial size was normal in size. Right Atrium: Right atrial size was normal in size. Pericardium: There is no evidence of pericardial effusion. Mitral Valve: The mitral valve is normal in structure. No evidence of mitral valve regurgitation. No evidence of mitral valve stenosis. Tricuspid Valve: The tricuspid valve is normal in structure. Tricuspid valve regurgitation is mild . No evidence of tricuspid stenosis. Aortic Valve: The aortic valve is tricuspid. Aortic valve regurgitation is trivial. No aortic stenosis is present. Aortic valve peak gradient measures 3.6 mmHg. Pulmonic Valve: The pulmonic valve was normal in structure. Pulmonic valve regurgitation is trivial. No evidence of pulmonic stenosis. Aorta: The aortic root is normal in size and structure. Venous: The inferior vena cava is dilated in size with greater than 50% respiratory variability, suggesting right atrial pressure of 8 mmHg. IAS/Shunts: No atrial level shunt detected by color flow Doppler.  LEFT VENTRICLE PLAX 2D LVIDd:         5.00 cm    Diastology LVIDs:         3.40 cm   LV e' medial:    6.30 cm/s LV PW:         0.70 cm   LV E/e' medial:  5.7 LV IVS:        0.80 cm   LV e' lateral:   12.50 cm/s LVOT diam:     2.10 cm   LV E/e' lateral: 2.9 LV SV:         32 LV SV Index:   16 LVOT Area:     3.46 cm  RIGHT VENTRICLE            IVC RV S prime:     9.45 cm/s  IVC diam: 2.30 cm TAPSE (M-mode): 1.2 cm LEFT ATRIUM             Index        RIGHT ATRIUM           Index LA diam:        3.10 cm 1.53 cm/m   RA Area:     14.20 cm LA Vol (A2C):   33.0 ml 16.32 ml/m  RA Volume:   30.00 ml  14.84 ml/m LA Vol (A4C):   28.4 ml 14.02 ml/m LA Biplane Vol: 35.0 ml 17.31 ml/m  AORTIC VALVE AV Area (Vmax): 2.40 cm AV Vmax:        95.40 cm/s AV Peak Grad:   3.6 mmHg LVOT Vmax:      66.10 cm/s LVOT Vmean:     42.533 cm/s LVOT VTI:       0.092 m  AORTA Ao Root diam: 3.50 cm Ao Asc diam:  3.00 cm MITRAL VALVE               TRICUSPID VALVE MV Area (PHT): 3.63 cm    TR Peak grad:   62.7 mmHg MV Decel Time: 209 msec    TR Vmax:        396.00 cm/s MV E velocity: 36.00 cm/s MV A velocity: 57.40 cm/s  SHUNTS MV E/A ratio:  0.63        Systemic VTI:  0.09 m                            Systemic  Diam: 2.10 cm Chilton Si MD Electronically signed by Chilton Si MD Signature Date/Time: 07/11/2023/3:35:46 PM    Final    VAS [place] LOWER EXTREMITY VENOUS (DVT) (ONLY [facility] & [facility])  Result Date: 07/10/2023  Lower Venous DVT Study Patient Name:  MONEY ETLING  Date of Exam:   07/10/2023 Medical Rec #: [id number]      Accession #:    [id number] Date of Birth: 02-09-1961      Patient Gender: M Patient Age:   35 years Exam Location:  [facility] Procedure:      VAS [place] LOWER EXTREMITY VENOUS (DVT) Referring Phys: Ernie Avena --------------------------------------------------------------------------------  Indications: Pain.  Risk Factors: None identified. Anticoagulation: Xarelto. Comparison Study: Novel PVD seen since previous exam Performing Technologist: Shona Simpson   Examination Guidelines: A complete evaluation includes B-mode imaging, spectral Doppler, color Doppler, and power Doppler as needed of all accessible portions of each vessel. Bilateral testing is considered an integral part of a complete examination. Limited examinations for reoccurring indications may be performed as noted. The reflux portion of the exam is performed with the patient in reverse Trendelenburg.  +---------+---------------+---------+-----------+----------+-------------------+ RIGHT    CompressibilityPhasicitySpontaneityPropertiesThrombus Aging      +---------+---------------+---------+-----------+----------+-------------------+ CFV      Full           Yes      Yes                                      +---------+---------------+---------+-----------+----------+-------------------+ SFJ      Full                                                             +---------+---------------+---------+-----------+----------+-------------------+ FV Prox  Full                                                             +---------+---------------+---------+-----------+----------+-------------------+ FV Mid   Full                                                             +---------+---------------+---------+-----------+----------+-------------------+ FV DistalFull                                                             +---------+---------------+---------+-----------+----------+-------------------+ PFV      Full                                                             +---------+---------------+---------+-----------+----------+-------------------+ POP  Partial        Yes      Yes                  Age Indeterminate   +---------+---------------+---------+-----------+----------+-------------------+ PTV      Full                                                              +---------+---------------+---------+-----------+----------+-------------------+ PERO                                                  Not well visualized +---------+---------------+---------+-----------+----------+-------------------+   +---------+---------------+---------+-----------+----------+--------------+ LEFT     CompressibilityPhasicitySpontaneityPropertiesThrombus Aging +---------+---------------+---------+-----------+----------+--------------+ CFV      Full           Yes      Yes                                 +---------+---------------+---------+-----------+----------+--------------+ SFJ      Full                                                        +---------+---------------+---------+-----------+----------+--------------+ FV Prox  Full                                                        +---------+---------------+---------+-----------+----------+--------------+ FV Mid   Full                                                        +---------+---------------+---------+-----------+----------+--------------+ FV DistalFull                                                        +---------+---------------+---------+-----------+----------+--------------+ PFV      Full                                                        +---------+---------------+---------+-----------+----------+--------------+ POP      Full           Yes      Yes                                 +---------+---------------+---------+-----------+----------+--------------+ PTV      Full                                                        +---------+---------------+---------+-----------+----------+--------------+  PERO     Full                                                        +---------+---------------+---------+-----------+----------+--------------+     Summary: RIGHT: - Findings consistent with age indeterminate deep vein thrombosis involving the right  popliteal vein.  - No cystic structure found in the popliteal fossa. - Peroneal and posterior tibial veins obstructed by calcified posterior tibial artery - Ultrasound characteristics of enlarged lymph nodes are noted in the groin.  LEFT: - There is no evidence of deep vein thrombosis in the lower extremity.  - No cystic structure found in the popliteal fossa. - Peroneal and posterior tibial veins obstructed by calcified posterior tibial artery - Ultrasound characteristics of enlarged lymph nodes noted in the groin.  *See table(s) above for measurements and observations. Electronically signed by Coral Else MD on 07/10/2023 at 9:05:41 PM.    Final    DG Chest Portable 1 View  Result Date: 07/10/2023 CLINICAL DATA:  Dyspnea EXAM: PORTABLE CHEST 1 VIEW COMPARISON:  None Available. FINDINGS: Lungs are clear. No pneumothorax or pleural effusion. Cardiac size within normal limits. Pulmonary vascularity is normal. Healed right rib fractures. No acute bone abnormality. IMPRESSION: 1. No active disease. Electronically Signed   By: Helyn Numbers M.D.   On: 07/10/2023 19:35   CT Angio Chest PE W and/or Wo Contrast  Result Date: 07/10/2023 CLINICAL DATA:  High probability pulmonary embolism, dyspnea EXAM: CT ANGIOGRAPHY CHEST WITH CONTRAST TECHNIQUE: Multidetector CT imaging of the chest was performed using the standard protocol during bolus administration of intravenous contrast. Multiplanar CT image reconstructions and MIPs were obtained to evaluate the vascular anatomy. RADIATION DOSE REDUCTION: This exam was performed according to the departmental dose-optimization program which includes automated exposure control, adjustment of the mA and/or kV according to patient size and/or use of iterative reconstruction technique. CONTRAST:  75mL OMNIPAQUE IOHEXOL 350 MG/ML SOLN COMPARISON:  None Available. FINDINGS: Cardiovascular: There is adequate opacification the pulmonary arterial tree. There is extensive branching  intraluminal filling defect within the lobar and segmental pulmonary arteries of the lungs bilaterally in keeping with acute pulmonary embolism. The embolic burden is large. The central pulmonary arteries are enlarged. There is inversion of the normal RV/LV ratio (RV/LV = 1.65) in keeping with changes of right heart strain. Minimal coronary artery calcification. Global cardiac size within normal limits. No pericardial effusion. The thoracic aorta is unremarkable. Mediastinum/Nodes: No enlarged mediastinal, hilar, or axillary lymph nodes. Thyroid gland, trachea, and esophagus demonstrate no significant findings. Lungs/Pleura: Patchy asymmetric infiltrate within the posterior basal left lower lobe may represent atelectasis or developing infarct. Mild asymmetric right apical scarring. Lungs are otherwise clear. No pneumothorax or pleural effusion. No central obstructing lesion. Upper Abdomen: No acute abnormality. Musculoskeletal: Multiple healed right rib fractures noted. No acute bone abnormality. No lytic or blastic bone lesion. Review of the MIP images confirms the above findings. IMPRESSION: 1. Acute pulmonary embolism with large embolic burden and CT evidence of right heart strain (RV/LV Ratio = 1.65) consistent with at least submassive (intermediate risk) PE. The presence of right heart strain has been associated with an increased risk of morbidity and mortality. Please refer to the "Code PE Focused" order set in EPIC. These results were called by telephone at the time of interpretation on 07/10/2023  at 7:34 pm to provider Ernie Avena , who verbally acknowledged these results. Electronically Signed   By: Helyn Numbers M.D.   On: 07/10/2023 19:34    Microbiology: Results for orders placed or performed during the hospital encounter of 07/10/23  Resp panel by RT-PCR (RSV, Flu A&B, Covid) Anterior Nasal Swab     Status: None   Collection Time: 07/10/23  4:37 PM   Specimen: Anterior Nasal Swab  Result Value  Ref Range Status   SARS Coronavirus 2 by RT PCR NEGATIVE NEGATIVE Final    Comment: (NOTE) SARS-CoV-2 target nucleic acids are NOT DETECTED.  The SARS-CoV-2 RNA is generally detectable in upper respiratory specimens during the acute phase of infection. The lowest concentration of SARS-CoV-2 viral copies this assay can detect is 138 copies/mL. A negative result does not preclude SARS-Cov-2 infection and should not be used as the sole basis for treatment or other patient management decisions. A negative result may occur with  improper specimen collection/handling, submission of specimen other than nasopharyngeal swab, presence of viral mutation(s) within the areas targeted by this assay, and inadequate number of viral copies(<138 copies/mL). A negative result must be combined with clinical observations, patient history, and epidemiological information. The expected result is Negative.  Fact Sheet for Patients:  BloggerCourse.com  Fact Sheet for Healthcare Providers:  SeriousBroker.it  This test is no t yet approved or cleared by the [place] FDA and  has been authorized for detection and/or diagnosis of SARS-CoV-2 by FDA under an Emergency Use Authorization (EUA). This EUA will remain  in effect (meaning this test can be used) for the duration of the COVID-19 declaration under Section 564(b)(1) of the Act, 21 U.S.C.section 360bbb-3(b)(1), unless the authorization is terminated  or revoked sooner.       Influenza A by PCR NEGATIVE NEGATIVE Final   Influenza B by PCR NEGATIVE NEGATIVE Final    Comment: (NOTE) The Xpert Xpress SARS-CoV-2/FLU/RSV plus assay is intended as an aid in the diagnosis of influenza from Nasopharyngeal swab specimens and should not be used as a sole basis for treatment. Nasal washings and aspirates are unacceptable for Xpert Xpress SARS-CoV-2/FLU/RSV testing.  Fact Sheet for  Patients: BloggerCourse.com  Fact Sheet for Healthcare Providers: SeriousBroker.it  This test is not yet approved or cleared by the [place] FDA and has been authorized for detection and/or diagnosis of SARS-CoV-2 by FDA under an Emergency Use Authorization (EUA). This EUA will remain in effect (meaning this test can be used) for the duration of the COVID-19 declaration under Section 564(b)(1) of the Act, 21 U.S.C. section 360bbb-3(b)(1), unless the authorization is terminated or revoked.     Resp Syncytial Virus by PCR NEGATIVE NEGATIVE Final    Comment: (NOTE) Fact Sheet for Patients: BloggerCourse.com  Fact Sheet for Healthcare Providers: SeriousBroker.it  This test is not yet approved or cleared by the [place] FDA and has been authorized for detection and/or diagnosis of SARS-CoV-2 by FDA under an Emergency Use Authorization (EUA). This EUA will remain in effect (meaning this test can be used) for the duration of the COVID-19 declaration under Section 564(b)(1) of the Act, 21 U.S.C. section 360bbb-3(b)(1), unless the authorization is terminated or revoked.  Performed at [facility], [address] [place], [place] [address]   MRSA Next Gen by PCR, Nasal     Status: Abnormal   Collection Time: 07/10/23 10:12 PM   Specimen: Nasal Mucosa; Nasal Swab  Result Value Ref Range Status   MRSA  by PCR Next Gen DETECTED (A) NOT DETECTED Final    Comment: (NOTE) The GeneXpert MRSA Assay (FDA approved for NASAL specimens only), is one component of a comprehensive MRSA colonization surveillance program. It is not intended to diagnose MRSA infection nor to guide or monitor treatment for MRSA infections. Test performance is not FDA approved in patients less than 62 years old. Performed at [facility], [address], [place],  [place] [address]    Labs: CBC: Recent Labs  Lab 07/17/23 0505 07/18/23 0450 07/19/23 0448 07/22/23 0441  WBC 4.6 5.8 6.2 6.6  NEUTROABS  --   --  2.7 2.9  HGB 9.9* 10.2* 10.1* 10.4*  HCT 31.1* 32.1* 31.8* 33.2*  MCV 91.5 92.2 92.7 91.2  PLT 226 276 324 485*   Basic Metabolic Panel: Recent Labs  Lab 07/17/23 0505 07/19/23 0448 07/22/23 0441  NA 140 136 136  K 4.1 4.0 3.8  CL 107 105 105  CO2 24 23 23   GLUCOSE 88 96 102*  BUN 14 18 17   CREATININE 0.89 0.78 0.91  CALCIUM 8.7* 8.5* 8.8*  MG 2.1 2.0 2.1  PHOS 4.5 4.4 4.7*   Liver Function Tests: Recent Labs  Lab 07/17/23 0505 07/19/23 0448 07/22/23 0441  AST 31 26 33  ALT 22 28 39  ALKPHOS 43 48 50  BILITOT <0.1* 0.3 0.2*  PROT 6.5 6.8 6.9  ALBUMIN 2.8* 3.0* 3.0*   CBG: No results for input(s): "GLUCAP" in the last 168 hours.  Discharge time spent: greater than 30 minutes.  Signed: Marguerita Merles, DO [facility] 07/23/2023

## 2023-07-23 NOTE — Telephone Encounter (Signed)
Left patient another vm to call back in regard to coum appt that was cancelled.

## 2023-07-23 NOTE — TOC Transition Note (Signed)
Transition of Care [facility]) - CM/SW Discharge Note   Patient Details  Name: Alexander [name] MRN: [id number] Date of Birth: 01-Jan-1961  Transition of Care [name] CM/SW Contact:  Lanier Clam, RN Phone Number: 07/23/2023, 11:29 AM   Clinical Narrative:d/c home cane delivered see prior note. No further CM needs.       Final next level of care: Home/Self Care Barriers to Discharge: No Barriers Identified   Patient Goals and CMS Choice      Discharge Placement                         Discharge Plan and Services Additional resources added to the After Visit Summary for       Post Acute Care Choice: Durable Medical Equipment          DME Arranged: Gilmer Mor DME Agency: [employer] Date DME Agency Contacted: 07/22/23 Time DME Agency Contacted: 1251 Representative spoke with at DME Agency: Vaughan Basta            Social Determinants of Health (SDOH) Interventions SDOH Screenings   Food Insecurity: No Food Insecurity (07/10/2023)  Housing: Patient Declined (07/10/2023)  Transportation Needs: No Transportation Needs (07/10/2023)  Utilities: Not At Risk (07/10/2023)  Depression (PHQ2-9): Low Risk  (06/01/2021)  Tobacco Use: Medium Risk (07/10/2023)     Readmission Risk Interventions    07/11/2023    2:11 PM  Readmission Risk Prevention Plan  Post Dischage Appt Complete  Medication Screening Complete  Transportation Screening Complete
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
## 2023-07-24 ENCOUNTER — Ambulatory Visit (INDEPENDENT_AMBULATORY_CARE_PROVIDER_SITE_OTHER): Payer: 59

## 2023-07-24 ENCOUNTER — Ambulatory Visit: Payer: 59

## 2023-07-24 ENCOUNTER — Telehealth: Payer: Self-pay

## 2023-07-24 ENCOUNTER — Encounter: Payer: Self-pay | Admitting: Physician Assistant

## 2023-07-24 ENCOUNTER — Ambulatory Visit (INDEPENDENT_AMBULATORY_CARE_PROVIDER_SITE_OTHER): Payer: 59 | Admitting: Physician Assistant

## 2023-07-24 VITALS — BP 132/70 | HR 102 | Temp 97.3°F | Ht 69.0 in | Wt 183.5 lb

## 2023-07-24 DIAGNOSIS — I2609 Other pulmonary embolism with acute cor pulmonale: Secondary | ICD-10-CM

## 2023-07-24 DIAGNOSIS — Z23 Encounter for immunization: Secondary | ICD-10-CM | POA: Diagnosis not present

## 2023-07-24 DIAGNOSIS — M79605 Pain in left leg: Secondary | ICD-10-CM

## 2023-07-24 DIAGNOSIS — R29818 Other symptoms and signs involving the nervous system: Secondary | ICD-10-CM

## 2023-07-24 DIAGNOSIS — Z7901 Long term (current) use of anticoagulants: Secondary | ICD-10-CM

## 2023-07-24 DIAGNOSIS — J9601 Acute respiratory failure with hypoxia: Secondary | ICD-10-CM

## 2023-07-24 DIAGNOSIS — H538 Other visual disturbances: Secondary | ICD-10-CM

## 2023-07-24 LAB — POCT INR: INR: 3.4 — AB (ref 2.0–3.0)

## 2023-07-24 NOTE — Patient Instructions (Addendum)
Pre visit review using our clinic review tool, if applicable. No additional management support is needed unless otherwise documented below in the visit note.  Hold dose tomorrow and then change weekly dose to take 2 tablets daily except take 1 tablet on Wednesdays. Recheck in 1 week at [facility], [address]

## 2023-07-24 NOTE — Transitions of Care (Post Inpatient/ED Visit) (Signed)
   07/24/2023  Name: Alexander [name] MRN: [id number] DOB: April 09, 1961  Today's TOC FU Call Status: Today's TOC FU Call Status:: Unsuccessful Call (1st Attempt) Unsuccessful Call (1st Attempt) Date: 07/24/23  Attempted to reach the patient regarding the most recent Inpatient/ED visit.  Follow Up Plan: Additional outreach attempts will be made to reach the patient to complete the Transitions of Care (Post Inpatient/ED visit) call.   Signature Karena Addison, LPN [facility] Nurse Health Advisor Direct Dial [phone]

## 2023-07-24 NOTE — Progress Notes (Signed)
A full discussion of the nature of anticoagulants has been carried out.  A benefit risk analysis has been presented to the patient, so that they understand the justification for choosing anticoagulation at this time. The need for frequent and regular monitoring, precise dosage adjustment and compliance is stressed.  Side effects of potential bleeding are discussed.  The patient should avoid any OTC items containing aspirin or ibuprofen, and should avoid great swings in general diet.  Avoid alcohol consumption.  Call if any signs of abnormal bleeding.   Pt recently hospitalized on 10/9 for acute DVT and PE. Discharged on warfarin, 10 mg daily. Was therapeutic at discharge at 2. Pt already took warfarin today. Hold dose tomorrow and then change weekly dose to take 2 tablets daily except take 1 tablet on Wednesdays. Recheck in 1 week at [facility], [address]
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
## 2023-07-24 NOTE — Transitions of Care (Post Inpatient/ED Visit) (Signed)
   07/24/2023  Name: Alexander [name] MRN: [id number] DOB: 1961-09-13  Today's TOC FU Call Status: Today's TOC FU Call Status:: Unsuccessful Call (1st Attempt) Unsuccessful Call (1st Attempt) Date: 07/24/23  Attempted to reach the patient regarding the most recent Inpatient/ED visit.  Follow Up Plan: Additional outreach attempts will be made to reach the patient to complete the Transitions of Care (Post Inpatient/ED visit) call.      Antionette Fairy, RN,BSN,CCM RN Care Manager Transitions of Care  [place]/Population Health  Direct Phone: [phone] Toll Free: [phone] Fax: [phone]

## 2023-07-24 NOTE — Patient Instructions (Signed)
It was great to see you!  Go NOW to [facility] Address: [address] [place], [place], [place] [address] You have an appointment with Carollee Herter for COUMADIN at 3:45p  For your medicaid: --Call your local Department of Social Services (DSS) to apply over the phone. --Look at forms and complete  When you return to see Carollee Herter next week, I would like additional blood work. Please schedule a lab-only appointment at that time.  Let's follow-up in 3 months for Comprehensive Physical Exam (CPE) preventive care annual visit, sooner if you have concerns.  Take care,  Jarold Motto PA-C

## 2023-07-25 ENCOUNTER — Telehealth: Payer: Self-pay

## 2023-07-25 NOTE — Transitions of Care (Post Inpatient/ED Visit) (Signed)
   07/25/2023  Name: Alexander [name] MRN: [id number] DOB: 1960-11-26  Today's TOC FU Call Status: Today's TOC FU Call Status:: Unsuccessful Call (2nd Attempt) Unsuccessful Call (1st Attempt) Date: 07/24/23 Unsuccessful Call (2nd Attempt) Date: 07/25/23  Attempted to reach the patient regarding the most recent Inpatient/ED visit.  Follow Up Plan: Additional outreach attempts will be made to reach the patient to complete the Transitions of Care (Post Inpatient/ED visit) call.   Signature Karena Addison, LPN [facility] Nurse Health Advisor Direct Dial [phone]

## 2023-07-25 NOTE — Transitions of Care (Post Inpatient/ED Visit) (Signed)
   07/25/2023  Name: Raywood Axon MRN: [id number] DOB: [birth year]  Today's TOC FU Call Status: Today's TOC FU Call Status:: Unsuccessful Call (2nd Attempt) Unsuccessful Call (2nd Attempt) Date: 07/25/23 (Male answered the phone and reported he was pt's nephew. States pt is "across the street" and will be back home shortly. He will have pt call RN CM back.)  Attempted to reach the patient regarding the most recent Inpatient/ED visit.  Follow Up Plan: Additional outreach attempts will be made to reach the patient to complete the Transitions of Care (Post Inpatient/ED visit) call.     Antionette Fairy, RN,BSN,CCM RN Care Manager Transitions of Care  [place]Population [facility]  Direct Phone: [phone] Toll Free: [phone] Fax: [phone]

## 2023-07-26 ENCOUNTER — Telehealth: Payer: Self-pay

## 2023-07-26 ENCOUNTER — Ambulatory Visit: Payer: 59 | Admitting: Physician Assistant

## 2023-07-26 NOTE — Transitions of Care (Post Inpatient/ED Visit) (Signed)
   07/26/2023  Name: Alexander [name] MRN: [id number] DOB: 09-02-1961  Today's TOC FU Call Status: Today's TOC FU Call Status:: Unsuccessful Call (3rd Attempt) Unsuccessful Call (3rd Attempt) Date: 07/26/23  Unsuccessful outreach attempt to engage pt for 30-day TOC program.  Follow Up Plan: No further outreach attempts will be made at this time. We have been unable to contact the patient.    Antionette Fairy, RN,BSN,CCM RN Care Manager Transitions of Care  [place]  Direct Phone: [phone] Toll Free: [phone] Fax: [phone]

## 2023-07-31 NOTE — Transitions of Care (Post Inpatient/ED Visit) (Signed)
   07/31/2023  Name: Alexander [name] MRN: [id number] DOB: Jan 13, 1961  Today's TOC FU Call Status: Today's TOC FU Call Status:: Unsuccessful Call (3rd Attempt) Unsuccessful Call (1st Attempt) Date: 07/24/23 Unsuccessful Call (2nd Attempt) Date: 07/25/23 Unsuccessful Call (3rd Attempt) Date: 07/31/23  Attempted to reach the patient regarding the most recent Inpatient/ED visit.  Follow Up Plan: No Additional outreach attempts will be made to reach the patient to complete the Transitions of Care (Post Inpatient/ED visit) call.   Signature Karena Addison, LPN [facility] Nurse Health Advisor Direct Dial [phone]

## 2023-08-02 ENCOUNTER — Ambulatory Visit (INDEPENDENT_AMBULATORY_CARE_PROVIDER_SITE_OTHER): Payer: 59

## 2023-08-02 DIAGNOSIS — Z7901 Long term (current) use of anticoagulants: Secondary | ICD-10-CM | POA: Diagnosis not present

## 2023-08-02 LAB — POCT INR: INR: 4.5 — AB (ref 2.0–3.0)

## 2023-08-02 MED ORDER — WARFARIN SODIUM 5 MG PO TABS: ORAL_TABLET | ORAL | 0 refills | Status: DC

## 2023-08-02 NOTE — Patient Instructions (Addendum)
Pre visit review using our clinic review tool, if applicable. No additional management support is needed unless otherwise documented below in the visit note.  Hold dose today and then reduce dose tomorrow to take 1 tablet and then change weekly dose to take 2 tablets daily except take 1 tablet on Monday, Wednesday and Friday. Recheck in 1 week at [facility], [address]

## 2023-08-02 NOTE — Progress Notes (Signed)
Hold dose today and then reduce dose tomorrow to take 1 tablet and then change weekly dose to take 2 tablets daily except take 1 tablet on Monday, Wednesday and Friday. Recheck in 1 week at [facility], [address]   Pt requested refill of warfarin. Sent in refill.

## 2023-08-09 ENCOUNTER — Telehealth: Payer: Self-pay

## 2023-08-09 ENCOUNTER — Ambulatory Visit: Payer: 59

## 2023-08-09 NOTE — Telephone Encounter (Signed)
Pt missed coumadin clinic apt today. LVM to call to RS.

## 2023-08-12 NOTE — Telephone Encounter (Signed)
LVM to call to RS coumadin clinic apt.

## 2023-08-12 NOTE — Telephone Encounter (Signed)
Left another VM 

## 2023-08-13 NOTE — Telephone Encounter (Signed)
Contacted pt who reports he tried to contact the coumadin clinic but the number went to the hospital. Verified number with pt. Inquired since pt did not have warfarin dosing  instructions past 11/8 on the calendar given at coumadin clinic apt on 11/1, what dose pt has been taking.  Pt reports he held one day, on 11/1, as advised at apt but then has been taking 2 tablets daily because that is what he thought he was told. Advised this is not the dosing instructions given. INR at apt on 11/1 was 4.5 so dose was decreased at that apt. Before that apt pt was taking 2 tablets daily. Advised pt to hold warfarin today and it is very important to check INR tomorrow. Pt agreed to go to [facility] for INR check. Scheduled for 3 pm. Pt denies any bleeding or abnormal bruising. Advised if any s/s to go to ER.  Pt also reported his pharmacy said he did not have refills of warfarin even though a new script was sent on 11/1. Advised pt after INR check tomorrow a new script will be sent with updated instructions. Pt verbalized understanding

## 2023-08-14 ENCOUNTER — Ambulatory Visit (INDEPENDENT_AMBULATORY_CARE_PROVIDER_SITE_OTHER): Payer: 59

## 2023-08-14 DIAGNOSIS — Z7901 Long term (current) use of anticoagulants: Secondary | ICD-10-CM | POA: Diagnosis not present

## 2023-08-14 LAB — POCT INR: INR: 2 (ref 2.0–3.0)

## 2023-08-14 NOTE — Patient Instructions (Addendum)
Pre visit review using our clinic review tool, if applicable. No additional management support is needed unless otherwise documented below in the visit note.  Continue 2 tablets daily except take 1 tablet on Monday, Wednesday and Friday. Recheck in 1 week at [place].

## 2023-08-14 NOTE — Progress Notes (Signed)
Pt missed last coumadin clinic apt. He was contacted by this nurse yesterday; conversation follows; Contacted pt who reports he tried to contact the coumadin clinic but the number went to the hospital. Verified number with pt. Inquired since pt did not have warfarin dosing  instructions past 11/8 on the calendar given at coumadin clinic apt on 11/1, what dose pt has been taking.  Pt reports he held one day, on 11/1, as advised at apt but then has been taking 2 tablets daily because that is what he thought he was told. Advised this is not the dosing instructions given. INR at apt on 11/1 was 4.5 so dose was decreased at that apt.  Pt reported at his apt today that he followed whatever dosing instructions were on his calendar that was printed at his last coumadin clinic apt. So, we will continue that dosing and recheck INR in one week since he is in range today. Still unsure if pt is confused or actually knows what he has been taking. Pt reports he missed last coumadin clinic apt due to transportation. Pt was advised on the importance of warfarin monitoring. Pt verbalized understanding. Continue 2 tablets daily except take 1 tablet on Monday, Wednesday and Friday. Recheck in 1 week at [place]. Sent msg to PCP to update her on pt status.

## 2023-08-21 ENCOUNTER — Telehealth: Payer: Self-pay

## 2023-08-21 ENCOUNTER — Ambulatory Visit: Payer: 59

## 2023-08-21 NOTE — Telephone Encounter (Signed)
Pt reports he does not have transportation for coumadin clinic apt today and requested another day this week. Advised Friday, 11/22, at [facility]. Pt agreed to apt on 11/22. Apt was scheduled.

## 2023-08-23 ENCOUNTER — Ambulatory Visit: Payer: 59

## 2023-08-23 NOTE — Telephone Encounter (Signed)
Pt called to report he cannot get his car to start and will have to RS his coumadin clinic apt because he does not have any alternate transportation.  Advised pt again the importance of coumadin clinic apt. Pt verbalized understanding and RS apt for 11/26 at 2:00 at [facility]. Pt verbalized understanding.

## 2023-08-26 ENCOUNTER — Telehealth: Payer: Self-pay | Admitting: Physician Assistant

## 2023-08-26 NOTE — Telephone Encounter (Signed)
I have left patient vm to call back to discuss transportation arrangements.

## 2023-08-26 NOTE — Telephone Encounter (Signed)
I have left patient vm to call me back.  I would like to know what we could do to assist him in getting to his appointments.

## 2023-08-27 ENCOUNTER — Ambulatory Visit (INDEPENDENT_AMBULATORY_CARE_PROVIDER_SITE_OTHER): Payer: 59

## 2023-08-27 DIAGNOSIS — Z7901 Long term (current) use of anticoagulants: Secondary | ICD-10-CM

## 2023-08-27 LAB — POCT INR: INR: 2.7 (ref 2.0–3.0)

## 2023-08-27 MED ORDER — WARFARIN SODIUM 5 MG PO TABS: ORAL_TABLET | ORAL | 0 refills | Status: DC

## 2023-08-27 NOTE — Patient Instructions (Addendum)
Pre visit review using our clinic review tool, if applicable. No additional management support is needed unless otherwise documented below in the visit note.  Continue 2 tablets daily except take 1 tablet on Monday, Wednesday and Friday. Recheck in 2 week at [facility].

## 2023-08-27 NOTE — Progress Notes (Addendum)
Continue 2 tablets daily except take 1 tablet on Monday, Wednesday and Friday. Recheck in 2 week at [facility].  Pt requested refill of warfarin. He reports he only has one pill left. He will pick up script today. Sent in refill of warfarin to requested pharmacy for 30 day supply.   Sent msg to PCP to update on pt's apt today.  Medical screening examination/treatment/procedure(s) were performed by non-physician practitioner and as supervising physician I was immediately available for consultation/collaboration.  I agree with above. Jacinta Shoe, MD

## 2023-09-10 ENCOUNTER — Ambulatory Visit (INDEPENDENT_AMBULATORY_CARE_PROVIDER_SITE_OTHER): Payer: 59

## 2023-09-10 DIAGNOSIS — Z7901 Long term (current) use of anticoagulants: Secondary | ICD-10-CM

## 2023-09-10 LAB — POCT INR: INR: 1.6 — AB (ref 2.0–3.0)

## 2023-09-10 NOTE — Patient Instructions (Addendum)
Pre visit review using our clinic review tool, if applicable. No additional management support is needed unless otherwise documented below in the visit note.  Increase dose today to take 3 tablets and increase dose tomorrow to take 1 1/2 tablets and then continue 2 tablets daily except take 1 tablet on Monday, Wednesday and Friday. Recheck in 2 week at [facility].

## 2023-09-10 NOTE — Progress Notes (Signed)
Pt denies any changes or missed doses and does not know why he is subtherapeutic today.  Increase dose today to take 3 tablets and increase dose tomorrow to take 1 1/2 tablets and then continue 2 tablets daily except take 1 tablet on Monday, Wednesday and Friday. Recheck in 2 week at [facility].

## 2023-09-20 ENCOUNTER — Ambulatory Visit (INDEPENDENT_AMBULATORY_CARE_PROVIDER_SITE_OTHER): Payer: 59

## 2023-09-20 DIAGNOSIS — Z7901 Long term (current) use of anticoagulants: Secondary | ICD-10-CM

## 2023-09-20 LAB — POCT INR: INR: 1.8 — AB (ref 2.0–3.0)

## 2023-09-20 MED ORDER — WARFARIN SODIUM 5 MG PO TABS: ORAL_TABLET | ORAL | 1 refills | Status: DC

## 2023-09-20 NOTE — Progress Notes (Signed)
Pt denies any changes or missed doses and does not know why he is subtherapeutic today.  Increase dose today to take 3 tablets and then change weekly dose to take 2 tablets daily except take 1 tablet on Monday and Friday. Recheck in 3 week at [facility].  Pt is compliant with warfarin management and PCP apts.  Sent in refill of warfarin to requested pharmacy.

## 2023-09-20 NOTE — Patient Instructions (Addendum)
Pre visit review using our clinic review tool, if applicable. No additional management support is needed unless otherwise documented below in the visit note.  Increase dose today to take 3 tablets and then change weekly dose to take 2 tablets daily except take 1 tablet on Monday and Friday. Recheck in 3 week at [facility].

## 2023-09-30 ENCOUNTER — Institutional Professional Consult (permissible substitution): Payer: 59 | Admitting: Pulmonary Disease

## 2023-09-30 ENCOUNTER — Encounter: Payer: Self-pay | Admitting: Pulmonary Disease

## 2023-10-11 ENCOUNTER — Telehealth: Payer: Self-pay

## 2023-10-11 ENCOUNTER — Ambulatory Visit: Payer: 59

## 2023-10-11 NOTE — Telephone Encounter (Signed)
 Tried to contact pt but it still just has a recording and no VM. Tried pt's son, Lenon Curt, and the same recording is played.
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
## 2023-10-11 NOTE — Telephone Encounter (Signed)
 Tried to reach pt again and same recording with no VM. Tried pt's son and same recording. Pt does not have mychart.  Clinic will close early due to weather at 3 pm.
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
## 2023-10-14 NOTE — Telephone Encounter (Signed)
 Tried again to contact pt but same recording "call cannot be completed at this time." Pt need to be scheduled for coumadin clinic apt.

## 2023-10-15 NOTE — Telephone Encounter (Signed)
 Tried to contact pt again but no answer and same recording that call cannot be completed at this time.

## 2023-10-16 NOTE — Telephone Encounter (Signed)
 Tried to contact pt again but no answer and same recording that call cannot be completed at this time.

## 2023-10-17 NOTE — Telephone Encounter (Signed)
Tried to contact pt again but no answer and same recording.

## 2023-10-21 NOTE — Telephone Encounter (Signed)
Tried to contact pt and was able to LVM this time.

## 2023-10-23 NOTE — Telephone Encounter (Signed)
LVM

## 2023-10-23 NOTE — Telephone Encounter (Signed)
Left another VM 

## 2023-10-24 NOTE — Telephone Encounter (Signed)
LVM  Review of chart shows pt has apt with PCP on 1/27. If pt does not call back for coumadin clinic apt a lab INR could be drawn at that apt if pt shows up for that apt. Forwarding msg to PCP. Made note on PCP scheduled for lab INR.

## 2023-10-25 NOTE — Telephone Encounter (Signed)
LVM for pt to return call to schedule a coumadin clinic apt.

## 2023-10-28 ENCOUNTER — Encounter: Payer: 59 | Admitting: Physician Assistant

## 2023-10-28 NOTE — Telephone Encounter (Signed)
LVM

## 2023-10-29 NOTE — Telephone Encounter (Signed)
LVM

## 2023-10-30 NOTE — Telephone Encounter (Signed)
Left another voicemail.  Fifteen attempts to reach pt and no response. No further attempts will be made.

## 2023-10-31 NOTE — Telephone Encounter (Signed)
Mailed letter. Pt does not use mychart.

## 2023-11-01 NOTE — Telephone Encounter (Signed)
Left another voicemail to call to schedule a coumadin clinic apt.

## 2023-11-07 NOTE — Telephone Encounter (Signed)
 Left another VM for pt.  Contacted pt's son, Alexander [name], who reports he has not been able to contact his father either. He said he has a hx of disappearing for long periods and he is not sure where he could be. Alexander [name] lives in [place]. He is going to try to find the number to the pt's neighbor so they can check on pt. Alexander [name] reports he still has the direct number to the coumadin  clinic and he will f/u with this nurse when he is able to contact him.

## 2023-11-07 NOTE — Telephone Encounter (Signed)
 Pt LVM to return call.  Tried to contact pt at number left but had to LVM.

## 2023-11-08 NOTE — Telephone Encounter (Signed)
 Left another VM

## 2023-11-11 NOTE — Telephone Encounter (Signed)
 Left another VM

## 2023-11-12 NOTE — Telephone Encounter (Signed)
Left another VM

## 2023-11-13 NOTE — Telephone Encounter (Signed)
LVM

## 2023-11-22 NOTE — Telephone Encounter (Signed)
Left another VM today.  Contacted [facility] and spoke to [facility], to inquire if pt had any further refills of warfarin. They report he did pick up warfarin Nov, Dec, and Jan 20th, but not yet this month. Advised to cancel all refills for warfarin and advised pt he needs to contact office for further refills. Nenee verbalized understanding.

## 2023-11-25 NOTE — Telephone Encounter (Signed)
 Pt contacted coumadin clinic reporting his phone had been disconnected so he was not able to f/u. He reports he received the letter mailed by the clinic today and that reminded him to call. Pt also requested a refill of warfarin and reported he has about 6 tablets left.  Scheduled pt for coumadin clinic for tomorrow at [facility] per request. Advised warfarin refill will be sent after his coumadin clinic apt. Pt verbalized understanding. Pt denied any changes.

## 2023-11-26 ENCOUNTER — Ambulatory Visit (INDEPENDENT_AMBULATORY_CARE_PROVIDER_SITE_OTHER): Payer: 59

## 2023-11-26 DIAGNOSIS — Z7901 Long term (current) use of anticoagulants: Secondary | ICD-10-CM

## 2023-11-26 LAB — POCT INR: INR: 1.4 — AB (ref 2.0–3.0)

## 2023-11-26 MED ORDER — WARFARIN SODIUM 5 MG PO TABS: ORAL_TABLET | ORAL | 0 refills | Status: DC

## 2023-11-26 NOTE — Telephone Encounter (Signed)
 Pt in for INR check today. INR was 1.4.  Sent in small warfarin refill and also made apt for next week for coumadin clinic and PCP apt.

## 2023-11-26 NOTE — Progress Notes (Signed)
 Last INR check was 12/20. Several VM were left for pt to call to schedule a coumadin clinic apt but there has not been any response until now. Pt is requesting a refill of warfarin. Will send in small refill to assure pt keeps coumadin clinic apts and PCP apts. Pt also NS his last PCP apt.  Sent in refill of warfarin for 30 day supply. Scheduled PCP apt for pt. Pt reports he has been taking 2 tablets daily for at least the last 2 weeks. Updated calendar. Increase dose today to take 3 tablets and increase dose tomorrow to take 3 tablets and then continue 2 tablets daily. Recheck in 1 week at [facility]. Advised pt importance of keeping coumadin clinic and PCP apts. Advised if he does not keep apts PCP will not manage warfarin. Pt verbalized understanding.

## 2023-11-26 NOTE — Patient Instructions (Addendum)
 Pre visit review using our clinic review tool, if applicable. No additional management support is needed unless otherwise documented below in the visit note.  Increase dose today to take 3 tablets and increase dose tomorrow to take 3 tablets and then continue 2 tablets daily. Recheck in 1 week at [facility].

## 2023-12-03 ENCOUNTER — Ambulatory Visit (INDEPENDENT_AMBULATORY_CARE_PROVIDER_SITE_OTHER): Payer: 59

## 2023-12-03 ENCOUNTER — Ambulatory Visit (INDEPENDENT_AMBULATORY_CARE_PROVIDER_SITE_OTHER): Payer: 59 | Admitting: Physician Assistant

## 2023-12-03 ENCOUNTER — Encounter: Payer: Self-pay | Admitting: Physician Assistant

## 2023-12-03 VITALS — BP 130/80 | HR 94 | Temp 98.4°F | Ht 69.0 in | Wt 174.2 lb

## 2023-12-03 DIAGNOSIS — R238 Other skin changes: Secondary | ICD-10-CM

## 2023-12-03 DIAGNOSIS — Z91199 Patient's noncompliance with other medical treatment and regimen due to unspecified reason: Secondary | ICD-10-CM

## 2023-12-03 DIAGNOSIS — I10 Essential (primary) hypertension: Secondary | ICD-10-CM

## 2023-12-03 DIAGNOSIS — R29818 Other symptoms and signs involving the nervous system: Secondary | ICD-10-CM | POA: Diagnosis not present

## 2023-12-03 DIAGNOSIS — Z7901 Long term (current) use of anticoagulants: Secondary | ICD-10-CM | POA: Diagnosis not present

## 2023-12-03 DIAGNOSIS — R7309 Other abnormal glucose: Secondary | ICD-10-CM

## 2023-12-03 DIAGNOSIS — E785 Hyperlipidemia, unspecified: Secondary | ICD-10-CM | POA: Diagnosis not present

## 2023-12-03 DIAGNOSIS — D509 Iron deficiency anemia, unspecified: Secondary | ICD-10-CM

## 2023-12-03 DIAGNOSIS — Z1211 Encounter for screening for malignant neoplasm of colon: Secondary | ICD-10-CM

## 2023-12-03 LAB — POCT INR: INR: 2.3 (ref 2.0–3.0)

## 2023-12-03 MED ORDER — MUPIROCIN 2 % EX OINT: TOPICAL_OINTMENT | CUTANEOUS | 0 refills | Status: DC

## 2023-12-03 NOTE — Progress Notes (Addendum)
 Pt also had PCP apt today. Continue 2 tablets daily. Recheck in 3 week at [facility]. Advised pt importance of keeping coumadin clinic and PCP apts. Advised if he does not keep apts PCP will not manage warfarin. Pt verbalized understanding.   Medical screening examination/treatment/procedure(s) were performed by non-physician practitioner and as supervising physician I was immediately available for consultation/collaboration.  I agree with above. Jacinta Shoe, MD
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
## 2023-12-03 NOTE — Patient Instructions (Addendum)
 It was great to see you!  As discussed, please keep all appointments and see specialists [facility] [phone]  Take care,  Jarold Motto PA-C

## 2023-12-03 NOTE — Patient Instructions (Addendum)
 Pre visit review using our clinic review tool, if applicable. No additional management support is needed unless otherwise documented below in the visit note.  Continue 2 tablets daily. Recheck in 3 week at [facility].

## 2023-12-04 ENCOUNTER — Emergency Department (HOSPITAL_COMMUNITY)
Admission: EM | Admit: 2023-12-04 | Discharge: 2023-12-05 | Disposition: A | Discharge status: Home / Self Care | Attending: Emergency Medicine | Admitting: Emergency Medicine

## 2023-12-04 ENCOUNTER — Telehealth: Payer: Self-pay | Admitting: *Deleted

## 2023-12-04 ENCOUNTER — Encounter (HOSPITAL_COMMUNITY): Payer: Self-pay

## 2023-12-04 ENCOUNTER — Other Ambulatory Visit: Payer: Self-pay

## 2023-12-04 DIAGNOSIS — D649 Anemia, unspecified: Secondary | ICD-10-CM | POA: Insufficient documentation

## 2023-12-04 DIAGNOSIS — Z7901 Long term (current) use of anticoagulants: Secondary | ICD-10-CM | POA: Insufficient documentation

## 2023-12-04 DIAGNOSIS — I10 Essential (primary) hypertension: Secondary | ICD-10-CM | POA: Diagnosis not present

## 2023-12-04 DIAGNOSIS — R799 Abnormal finding of blood chemistry, unspecified: Secondary | ICD-10-CM | POA: Diagnosis present

## 2023-12-04 LAB — COMPREHENSIVE METABOLIC PANEL
ALT: 7 U/L (ref 0–53)
ALT: 8 U/L (ref 0–44)
AST: 13 U/L (ref 0–37)
AST: 14 U/L — ABNORMAL LOW (ref 15–41)
Albumin: 4 g/dL (ref 3.5–5.2)
Albumin: 3.8 g/dL (ref 3.5–5.0)
Alkaline Phosphatase: 67 U/L (ref 39–117)
Alkaline Phosphatase: 64 U/L (ref 38–126)
Anion gap: 9 (ref 5–15)
BUN: 13 mg/dL (ref 6–23)
BUN: 14 mg/dL (ref 8–23)
CO2: 25 meq/L (ref 19–32)
CO2: 23 mmol/L (ref 22–32)
Calcium: 9.1 mg/dL (ref 8.4–10.5)
Calcium: 8.9 mg/dL (ref 8.9–10.3)
Chloride: 107 meq/L (ref 96–112)
Chloride: 108 mmol/L (ref 98–111)
Creatinine, Ser: 1.09 mg/dL (ref 0.40–1.50)
Creatinine, Ser: 0.94 mg/dL (ref 0.61–1.24)
GFR, Estimated: >60 mL/min (ref >60)
GFR: 73.14 mL/min (ref >60.00)
Glucose, Bld: 100 mg/dL — ABNORMAL HIGH (ref 70–99)
Glucose, Bld: 97 mg/dL (ref 70–99)
Potassium: 3.6 meq/L (ref 3.5–5.1)
Potassium: 3.7 mmol/L (ref 3.5–5.1)
Sodium: 141 meq/L (ref 135–145)
Sodium: 140 mmol/L (ref 135–145)
Total Bilirubin: 0.2 mg/dL (ref 0.2–1.2)
Total Bilirubin: 0.5 mg/dL (ref 0.0–1.2)
Total Protein: 7.5 g/dL (ref 6.0–8.3)
Total Protein: 7.6 g/dL (ref 6.5–8.1)

## 2023-12-04 LAB — CBC WITH DIFFERENTIAL/PLATELET
Abs Immature Granulocytes: 0.02 10*3/uL (ref 0.00–0.07)
Basophils Absolute: 0 10*3/uL (ref 0.0–0.1)
Basophils Absolute: 0 10*3/uL (ref 0.0–0.1)
Basophils Relative: 0.5 % (ref 0.0–3.0)
Basophils Relative: 0 %
Eosinophils Absolute: 0.5 10*3/uL (ref 0.0–0.7)
Eosinophils Absolute: 0.6 10*3/uL — ABNORMAL HIGH (ref 0.0–0.5)
Eosinophils Relative: 9.7 % — ABNORMAL HIGH (ref 0.0–5.0)
Eosinophils Relative: 12 %
HCT: 24.9 % — ABNORMAL LOW (ref 39.0–52.0)
HCT: 27.9 % — ABNORMAL LOW (ref 39.0–52.0)
Hemoglobin: 7.6 g/dL — CL (ref 13.0–17.0)
Hemoglobin: 7.6 g/dL — ABNORMAL LOW (ref 13.0–17.0)
Immature Granulocytes: 0 %
Lymphocytes Relative: 22.5 % (ref 12.0–46.0)
Lymphocytes Relative: 28 %
Lymphs Abs: 1.2 10*3/uL (ref 0.7–4.0)
Lymphs Abs: 1.4 10*3/uL (ref 0.7–4.0)
MCH: 21.7 pg — ABNORMAL LOW (ref 26.0–34.0)
MCHC: 30.6 g/dL (ref 30.0–36.0)
MCHC: 27.2 g/dL — ABNORMAL LOW (ref 30.0–36.0)
MCV: 75.5 fl — ABNORMAL LOW (ref 78.0–100.0)
MCV: 79.5 fL — ABNORMAL LOW (ref 80.0–100.0)
Monocytes Absolute: 0.4 10*3/uL (ref 0.1–1.0)
Monocytes Absolute: 0.4 10*3/uL (ref 0.1–1.0)
Monocytes Relative: 7.7 % (ref 3.0–12.0)
Monocytes Relative: 9 %
Neutro Abs: 3.1 10*3/uL (ref 1.4–7.7)
Neutro Abs: 2.5 10*3/uL (ref 1.7–7.7)
Neutrophils Relative %: 59.6 % (ref 43.0–77.0)
Neutrophils Relative %: 51 %
Platelets: 463 10*3/uL — ABNORMAL HIGH (ref 150.0–400.0)
Platelets: 424 10*3/uL — ABNORMAL HIGH (ref 150–400)
RBC: 3.29 Mil/uL — ABNORMAL LOW (ref 4.22–5.81)
RBC: 3.51 MIL/uL — ABNORMAL LOW (ref 4.22–5.81)
RDW: 20.9 % — ABNORMAL HIGH (ref 11.5–15.5)
RDW: 19.3 % — ABNORMAL HIGH (ref 11.5–15.5)
WBC: 5.2 10*3/uL (ref 4.0–10.5)
WBC: 4.9 10*3/uL (ref 4.0–10.5)
nRBC: 0 % (ref 0.0–0.2)

## 2023-12-04 LAB — LIPID PANEL
Cholesterol: 114 mg/dL (ref 0–200)
HDL: 43.4 mg/dL (ref >39.00)
LDL Cholesterol: 58 mg/dL (ref 0–99)
NonHDL: 70.47
Total CHOL/HDL Ratio: 3
Triglycerides: 60 mg/dL (ref 0.0–149.0)
VLDL: 12 mg/dL (ref 0.0–40.0)

## 2023-12-04 LAB — HEMOGLOBIN A1C: Hgb A1c MFr Bld: 5.9 % (ref 4.6–6.5)

## 2023-12-04 LAB — PROTIME-INR
INR: 1.9 — ABNORMAL HIGH (ref 0.8–1.2)
Prothrombin Time: 21.9 s — ABNORMAL HIGH (ref 11.4–15.2)

## 2023-12-04 LAB — IBC + FERRITIN
Ferritin: 4.5 ng/mL — ABNORMAL LOW (ref 22.0–322.0)
Iron: 15 ug/dL — ABNORMAL LOW (ref 42–165)
Saturation Ratios: 3.9 % — ABNORMAL LOW (ref 20.0–50.0)
TIBC: 382.2 ug/dL (ref 250.0–450.0)
Transferrin: 273 mg/dL (ref 212.0–360.0)

## 2023-12-04 LAB — POC OCCULT BLOOD, ED: Fecal Occult Bld: NEGATIVE

## 2023-12-04 MED ORDER — SODIUM CHLORIDE 0.9% IV SOLUTION: Freq: Once | INTRAVENOUS | Status: AC

## 2023-12-04 NOTE — Telephone Encounter (Signed)
 Left message on voicemail to call office. Pt needs to go to ER now, [facility] or [facility].

## 2023-12-04 NOTE — Telephone Encounter (Signed)
 See other message

## 2023-12-04 NOTE — ED Provider Notes (Signed)
 [place] EMERGENCY DEPARTMENT AT [facility] Provider Note   CSN: [id number] Arrival date & time: 12/04/23  1425     History  Chief Complaint  Patient presents with   Low Hemoglobin    Alexander [name] is a 63 y.o. male.  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  The patient states that yesterday he was seen by his primary doctor for an annual visit and had labs drawn.  He states that he was called today that his hemoglobin was low and is recommended to come to the ER.  He states that he has not had any obvious bleeding, no black or bloody stools nosebleeds or blood in his vomit.  He denies any significant weakness or fatigue.  Did report that he was eating more ice to his primary.  The history is provided by the patient.       Home Medications Prior to Admission medications   Medication Sig Start Date End Date Taking? Authorizing Provider  acetaminophen (TYLENOL) 325 MG tablet Take 2 tablets (650 mg total) by mouth every 6 (six) hours as needed for mild pain (pain score 1-3) (or Fever >/= 101). 07/23/23   Marguerita Merles Latif, DO  mupirocin ointment (BACTROBAN) 2 % Apply to affected area 1-2 times daily 12/03/23   Jarold Motto, PA  polyethylene glycol powder (GLYCOLAX/MIRALAX) 17 GM/SCOOP powder Mix as directed and take 17 g by mouth daily. Patient not taking: Reported on 12/03/2023 07/23/23   Marguerita Merles Latif, DO  warfarin (COUMADIN) 5 MG tablet TAKE 2 TABLETS BY MOUTH DAILY OR AS DIRECTED BY ANTICOAGULATION CLINIC 11/26/23   Jarold Motto, PA      Allergies    Patient has no known allergies.    Review of Systems   Review of Systems  Physical Exam Updated Vital Signs BP 127/67   Pulse 92   Temp 98.6 F (37 C) (Oral)   Resp 18   Ht 5\' 9"  (1.753 m)   Wt 80.7 kg   SpO2 98%   BMI 26.29 kg/m  Physical Exam Vitals and nursing note reviewed. Exam  conducted with a chaperone present.  Constitutional:      General: He is not in acute distress.    Appearance: Normal appearance.  HENT:     Head: Normocephalic and atraumatic.     Nose: Nose normal.     Mouth/Throat:     Mouth: Mucous membranes are moist.     Pharynx: Oropharynx is clear.  Eyes:     Extraocular Movements: Extraocular movements intact.     Conjunctiva/sclera: Conjunctivae normal.  Cardiovascular:     Rate and Rhythm: Normal rate and regular rhythm.     Heart sounds: Normal heart sounds.  Pulmonary:     Effort: Pulmonary effort is normal.     Breath sounds: Normal breath sounds.  Abdominal:     General: Abdomen is flat.     Palpations: Abdomen is soft.     Tenderness: There is no abdominal tenderness.  Genitourinary:    Rectum: Guaiac result negative (light brown stool).  Musculoskeletal:        General: Normal range of motion.     Cervical back: Normal range of motion.  Skin:    General: Skin is warm and dry.  Neurological:     General: No focal deficit present.     Mental Status: He is alert and oriented  to person, place, and time.  Psychiatric:        Mood and Affect: Mood normal.        Behavior: Behavior normal.     ED Results / Procedures / Treatments   Labs (all labs ordered are listed, but only abnormal results are displayed) Labs Reviewed  CBC WITH DIFFERENTIAL/PLATELET - Abnormal; Notable for the following components:      Result Value   RBC 3.51 (*)    Hemoglobin 7.6 (*)    HCT 27.9 (*)    MCV 79.5 (*)    MCH 21.7 (*)    MCHC 27.2 (*)    RDW 19.3 (*)    Platelets 424 (*)    Eosinophils Absolute 0.6 (*)    All other components within normal limits  COMPREHENSIVE METABOLIC PANEL - Abnormal; Notable for the following components:   AST 14 (*)    All other components within normal limits  PROTIME-INR - Abnormal; Notable for the following components:   Prothrombin Time 21.9 (*)    INR 1.9 (*)    All other components within normal  limits  RAPID URINE DRUG SCREEN, HOSP PERFORMED  VITAMIN B12  FOLATE  IRON AND TIBC  FERRITIN  RETICULOCYTES  POC OCCULT BLOOD, ED  TYPE AND SCREEN  ABO/RH  PREPARE RBC (CROSSMATCH)    EKG None  Radiology No results found.  Procedures Procedures    Medications Ordered in ED Medications  0.9 %  sodium chloride infusion (Manually program via Guardrails IV Fluids) (has no administration in time range)    ED Course/ Medical Decision Making/ A&P Clinical Course as of 12/04/23 2347  Wed Dec 04, 2023  2340 Hemoccult is negative, no obvious source of bleeding.  Will add on anemia labs and recommended blood transfusion.  Patient will likely be stable for discharge home after transfusion. Patient signed out to Dr. Andria Meuse pending reassessment after transfusion. [VK]    Clinical Course User Index [VK] [name], DO                                 Medical Decision Making This patient presents to the ED with chief complaint(s) of anemia with pertinent past medical history of VTE on coumadin, HTN which further complicates the presenting complaint. The complaint involves an extensive differential diagnosis and also carries with it a high risk of complications and morbidity.    The differential diagnosis includes anemia, coagulopathy, GI bleed, no other obvious source of bleeding  Additional history obtained: Additional history obtained from N/A Records reviewed Primary Care Documents  ED Course and Reassessment: On patient's arrival he was hemodynamically stable in no acute distress.  He is initially evaluated by provider in triage and had labs performed here that did show hemoglobin of 7.6, stable from his outpatient labs, down from his baseline of around 10 3 months ago.  Patient will need Hemoccult and PT/INR added on and he will be closely reassessed.  Independent labs interpretation:  The following labs were independently interpreted: acute on chronic anemia,  otherwise labs at baseline  Independent visualization of imaging: - N/A  Consultation: - Consulted or discussed management/test interpretation w/ external professional: N/A   Amount and/or Complexity of Data Reviewed Labs: ordered.  Risk Prescription drug management.          Final Clinical Impression(s) / ED Diagnoses Final diagnoses:  Anemia, unspecified type    Rx / DC  Orders ED Discharge Orders     None         [name], [place] 12/04/23 2347

## 2023-12-04 NOTE — Telephone Encounter (Signed)
 Left message on voicemail to call office. Pt needs to go to the ER now due to low hemoglobin.

## 2023-12-04 NOTE — Telephone Encounter (Signed)
 Spoke with patient advise to go to ED as recommended by PCP due to low Hemoglobin  Hemoglobin 7.6  Patient stated will go tomorrow, advise due to been low need to go today  Verbalized understanding and will go today to ED

## 2023-12-04 NOTE — Telephone Encounter (Signed)
 LVM to contact office

## 2023-12-04 NOTE — Discharge Instructions (Addendum)
 You were seen in the emergency department for your anemia.  Your INR levels were therapeutic and you had no blood in your stools or any other source of bleeding.  We did give you 1 unit of blood in the ER and sent labs to evaluate for cause of your anemia.  You should follow-up with your primary doctor to have your hemoglobin rechecked and for further anemia evaluation.  You should return to the emergency department if you notice bleeding on Coumadin, you have increasing weakness or shortness of breath or any other new or concerning symptoms.

## 2023-12-04 NOTE — Telephone Encounter (Signed)
 CRITICAL VALUE STICKER  CRITICAL VALUE: Hemoglobin 7.6  RECEIVER (on-site recipient of call): Lupita Leash  DATE & TIME NOTIFIED: 12/04/2023 at 10:30  MESSENGER (representative from lab): Clydie Braun from [facility]  MD NOTIFIED: Jarold Motto, PA  TIME OF NOTIFICATION: 10:31, notified verbally and message sent to provider.  RESPONSE:

## 2023-12-04 NOTE — Telephone Encounter (Signed)
 Please see critical lab: Hemoglobin 7.6

## 2023-12-04 NOTE — Telephone Encounter (Signed)
 Spoke with patient advise to go to ED as recommended by PCP due to low Hemoglobin  Hemoglobin 7.6  Patient stated will go tomorrow, advise due to been low need to go today  Verbalized understanding, will go today to ED

## 2023-12-04 NOTE — Telephone Encounter (Signed)
 Samantha notified of critical hemoglobin of 7.6. She said pt to go to the ER now.

## 2023-12-04 NOTE — Telephone Encounter (Signed)
 Left message on voicemail to call office.

## 2023-12-04 NOTE — ED Triage Notes (Signed)
 Hemoglobin of 7, drawn yesterday. Pt states he is on warfarin. Denies blood in stools or dark stools. Pt states he feels normal. Denies pain

## 2023-12-04 NOTE — ED Provider Triage Note (Addendum)
 Emergency Medicine Provider Triage Evaluation Note  Lukus Binion , a 63 y.o. male  was evaluated in triage.  Pt complains of anemia.  Review of Systems  Positive:  Negative:   Physical Exam  BP (!) 153/79 (BP Location: Right Arm)   Pulse 85   Temp 98.2 F (36.8 C) (Oral)   Resp 18   Ht 5\' 9"  (1.753 m)   Wt 80.7 kg   SpO2 100%   BMI 26.29 kg/m  Gen:   Awake, no distress   Resp:  Normal effort  MSK:   Moves extremities without difficulty  Other:    Medical Decision Making  Medically screening exam initiated at 2:48 PM.  Appropriate orders placed.  Ugo Thoma was informed that the remainder of the evaluation will be completed by another provider, this initial triage assessment does not replace that evaluation, and the importance of remaining in the ED until their evaluation is complete.  Low hemoglobin. Normal colored stools. No abdominal pain/chest pain. No hematuria. In on warfarin.   PCP messaging me requesting UDS to be added to workup today. Apparently patient asked PCP if cocaine and warfarin can be used together.    Valrie Hart F, [place] 12/04/23 [phone]

## 2023-12-05 ENCOUNTER — Other Ambulatory Visit: Payer: Self-pay | Admitting: Physician Assistant

## 2023-12-05 DIAGNOSIS — D649 Anemia, unspecified: Secondary | ICD-10-CM

## 2023-12-05 LAB — RETICULOCYTES
Immature Retic Fract: 33 % — ABNORMAL HIGH (ref 2.3–15.9)
RBC.: 2.92 MIL/uL — ABNORMAL LOW (ref 4.22–5.81)
Retic Count, Absolute: 21.3 10*3/uL (ref 19.0–186.0)
Retic Ct Pct: 0.7 % (ref 0.4–3.1)

## 2023-12-05 LAB — IRON AND TIBC
Iron: 11 ug/dL — ABNORMAL LOW (ref 45–182)
Saturation Ratios: 3 % — ABNORMAL LOW (ref 17.9–39.5)
TIBC: 364 ug/dL (ref 250–450)
UIBC: 353 ug/dL

## 2023-12-05 LAB — FOLATE: Folate: 8 ng/mL (ref >5.9)

## 2023-12-05 LAB — ABO/RH: ABO/RH(D): B POS

## 2023-12-05 LAB — VITAMIN B12: Vitamin B-12: 100 pg/mL — ABNORMAL LOW (ref 180–914)

## 2023-12-05 LAB — FERRITIN: Ferritin: 3 ng/mL — ABNORMAL LOW (ref 24–336)

## 2023-12-05 LAB — PREPARE RBC (CROSSMATCH)

## 2023-12-05 NOTE — ED Provider Notes (Signed)
  Physical Exam  BP 134/73   Pulse 88   Temp 98.6 F (37 C) (Oral)   Resp 15   Ht 5\' 9"  (1.753 m)   Wt 80.7 kg   SpO2 99%   BMI 26.29 kg/m   Physical Exam  Procedures  Procedures  ED Course / MDM   Clinical Course as of 12/05/23 0433  Wed Dec 04, 2023  2340 Hemoccult is negative, no obvious source of bleeding.  Will add on anemia labs and recommended blood transfusion.  Patient will likely be stable for discharge home after transfusion. Patient signed out to Dr. Andria Meuse pending reassessment after transfusion. [VK]    Clinical Course User Index [VK] [name], DO   Medical Decision Making Amount and/or Complexity of Data Reviewed Labs: ordered.  Risk Prescription drug management.   Alexander [name], assumed care for this patient.  In brief this 63 year old male here today due to low hemoglobin.  Patient was signed out pending blood transfusion.  Patient hemodynamically stable.  No evidence of overt GI bleeding.  Patient with close PCP follow-up.  Will discharge.       Alexander Simmonds T, DO 12/05/23 [phone]

## 2023-12-06 LAB — BPAM RBC
Blood Product Expiration Date: 202504062359
ISSUE DATE / TIME: 202503060126
Unit Type and Rh: 5100

## 2023-12-06 LAB — TYPE AND SCREEN
ABO/RH(D): B POS
Antibody Screen: NEGATIVE
Unit division: 0

## 2023-12-24 ENCOUNTER — Ambulatory Visit (INDEPENDENT_AMBULATORY_CARE_PROVIDER_SITE_OTHER)

## 2023-12-24 ENCOUNTER — Other Ambulatory Visit

## 2023-12-24 DIAGNOSIS — Z7901 Long term (current) use of anticoagulants: Secondary | ICD-10-CM | POA: Diagnosis not present

## 2023-12-24 DIAGNOSIS — D649 Anemia, unspecified: Secondary | ICD-10-CM

## 2023-12-24 LAB — POCT INR: INR: 2.5 (ref 2.0–3.0)

## 2023-12-24 MED ORDER — WARFARIN SODIUM 5 MG PO TABS
ORAL_TABLET | ORAL | 0 refills | Status: DC
Start: 2023-12-24 — End: 2024-02-10

## 2023-12-24 NOTE — Progress Notes (Signed)
 Pt also had PCP apt today. Pt went to ER on 3/5 per PCP recommendation for low hemoglobin. Pt received transfusion and was d/c. Pt will also have lab for f/u CBC today, per PCP request. Pt will have lab draw downstairs at [facility] lab. INR in ER was 1.9 Continue 2 tablets daily. Recheck in 3 week at [facility]. Advised pt importance of keeping coumadin clinic and PCP apts. Advised if he does not keep apts PCP will not manage warfarin. Pt verbalized understanding.    Pt is compliant with warfarin management and PCP apts.  Sent in refill of warfarin to requested pharmacy.

## 2023-12-24 NOTE — Patient Instructions (Addendum)
 Pre visit review using our clinic review tool, if applicable. No additional management support is needed unless otherwise documented below in the visit note.  Continue 2 tablets daily. Recheck in 3 week at [facility].

## 2024-01-11 ENCOUNTER — Other Ambulatory Visit: Payer: Self-pay

## 2024-01-11 ENCOUNTER — Inpatient Hospital Stay (HOSPITAL_COMMUNITY)
Admission: EM | Admit: 2024-01-11 | Discharge: 2024-02-10 | DRG: 853 | Disposition: A | Discharge status: Other Acute Inpatient Hospital | Attending: Internal Medicine | Admitting: Internal Medicine

## 2024-01-11 ENCOUNTER — Emergency Department (HOSPITAL_COMMUNITY)

## 2024-01-11 ENCOUNTER — Encounter (HOSPITAL_COMMUNITY): Payer: Self-pay

## 2024-01-11 DIAGNOSIS — R609 Edema, unspecified: Secondary | ICD-10-CM | POA: Diagnosis not present

## 2024-01-11 DIAGNOSIS — Z9911 Dependence on respirator [ventilator] status: Secondary | ICD-10-CM | POA: Diagnosis not present

## 2024-01-11 DIAGNOSIS — E874 Mixed disorder of acid-base balance: Secondary | ICD-10-CM | POA: Diagnosis present

## 2024-01-11 DIAGNOSIS — I110 Hypertensive heart disease with heart failure: Secondary | ICD-10-CM | POA: Diagnosis present

## 2024-01-11 DIAGNOSIS — Y838 Other surgical procedures as the cause of abnormal reaction of the patient, or of later complication, without mention of misadventure at the time of the procedure: Secondary | ICD-10-CM | POA: Diagnosis not present

## 2024-01-11 DIAGNOSIS — G9340 Encephalopathy, unspecified: Secondary | ICD-10-CM | POA: Diagnosis not present

## 2024-01-11 DIAGNOSIS — K921 Melena: Secondary | ICD-10-CM

## 2024-01-11 DIAGNOSIS — I700 Atherosclerosis of aorta: Secondary | ICD-10-CM | POA: Diagnosis present

## 2024-01-11 DIAGNOSIS — Z86718 Personal history of other venous thrombosis and embolism: Secondary | ICD-10-CM

## 2024-01-11 DIAGNOSIS — J95851 Ventilator associated pneumonia: Secondary | ICD-10-CM | POA: Diagnosis not present

## 2024-01-11 DIAGNOSIS — D62 Acute posthemorrhagic anemia: Secondary | ICD-10-CM

## 2024-01-11 DIAGNOSIS — R188 Other ascites: Secondary | ICD-10-CM | POA: Diagnosis not present

## 2024-01-11 DIAGNOSIS — I509 Heart failure, unspecified: Secondary | ICD-10-CM | POA: Diagnosis not present

## 2024-01-11 DIAGNOSIS — Y848 Other medical procedures as the cause of abnormal reaction of the patient, or of later complication, without mention of misadventure at the time of the procedure: Secondary | ICD-10-CM | POA: Diagnosis not present

## 2024-01-11 DIAGNOSIS — R6521 Severe sepsis with septic shock: Secondary | ICD-10-CM | POA: Diagnosis present

## 2024-01-11 DIAGNOSIS — E1151 Type 2 diabetes mellitus with diabetic peripheral angiopathy without gangrene: Secondary | ICD-10-CM | POA: Diagnosis present

## 2024-01-11 DIAGNOSIS — E785 Hyperlipidemia, unspecified: Secondary | ICD-10-CM | POA: Diagnosis not present

## 2024-01-11 DIAGNOSIS — Z452 Encounter for adjustment and management of vascular access device: Secondary | ICD-10-CM | POA: Diagnosis not present

## 2024-01-11 DIAGNOSIS — I5032 Chronic diastolic (congestive) heart failure: Secondary | ICD-10-CM | POA: Diagnosis not present

## 2024-01-11 DIAGNOSIS — I739 Peripheral vascular disease, unspecified: Secondary | ICD-10-CM | POA: Diagnosis present

## 2024-01-11 DIAGNOSIS — Z5329 Procedure and treatment not carried out because of patient's decision for other reasons: Secondary | ICD-10-CM | POA: Diagnosis not present

## 2024-01-11 DIAGNOSIS — Z1152 Encounter for screening for COVID-19: Secondary | ICD-10-CM

## 2024-01-11 DIAGNOSIS — I959 Hypotension, unspecified: Secondary | ICD-10-CM | POA: Diagnosis present

## 2024-01-11 DIAGNOSIS — K5730 Diverticulosis of large intestine without perforation or abscess without bleeding: Secondary | ICD-10-CM | POA: Diagnosis not present

## 2024-01-11 DIAGNOSIS — I10 Essential (primary) hypertension: Secondary | ICD-10-CM | POA: Diagnosis present

## 2024-01-11 DIAGNOSIS — M109 Gout, unspecified: Secondary | ICD-10-CM | POA: Diagnosis present

## 2024-01-11 DIAGNOSIS — K5721 Diverticulitis of large intestine with perforation and abscess with bleeding: Secondary | ICD-10-CM | POA: Diagnosis present

## 2024-01-11 DIAGNOSIS — I714 Abdominal aortic aneurysm, without rupture, unspecified: Secondary | ICD-10-CM | POA: Diagnosis not present

## 2024-01-11 DIAGNOSIS — D696 Thrombocytopenia, unspecified: Secondary | ICD-10-CM | POA: Diagnosis not present

## 2024-01-11 DIAGNOSIS — R6520 Severe sepsis without septic shock: Secondary | ICD-10-CM | POA: Diagnosis present

## 2024-01-11 DIAGNOSIS — K5289 Other specified noninfective gastroenteritis and colitis: Secondary | ICD-10-CM | POA: Diagnosis not present

## 2024-01-11 DIAGNOSIS — E861 Hypovolemia: Secondary | ICD-10-CM | POA: Diagnosis not present

## 2024-01-11 DIAGNOSIS — E538 Deficiency of other specified B group vitamins: Secondary | ICD-10-CM | POA: Diagnosis present

## 2024-01-11 DIAGNOSIS — N170 Acute kidney failure with tubular necrosis: Secondary | ICD-10-CM | POA: Diagnosis not present

## 2024-01-11 DIAGNOSIS — E872 Acidosis, unspecified: Secondary | ICD-10-CM | POA: Diagnosis not present

## 2024-01-11 DIAGNOSIS — T8119XA Other postprocedural shock, initial encounter: Secondary | ICD-10-CM | POA: Diagnosis not present

## 2024-01-11 DIAGNOSIS — R7401 Elevation of levels of liver transaminase levels: Secondary | ICD-10-CM | POA: Diagnosis not present

## 2024-01-11 DIAGNOSIS — R109 Unspecified abdominal pain: Secondary | ICD-10-CM | POA: Diagnosis not present

## 2024-01-11 DIAGNOSIS — Z86711 Personal history of pulmonary embolism: Secondary | ICD-10-CM | POA: Diagnosis not present

## 2024-01-11 DIAGNOSIS — K7200 Acute and subacute hepatic failure without coma: Secondary | ICD-10-CM | POA: Diagnosis not present

## 2024-01-11 DIAGNOSIS — K219 Gastro-esophageal reflux disease without esophagitis: Secondary | ICD-10-CM | POA: Diagnosis present

## 2024-01-11 DIAGNOSIS — R1084 Generalized abdominal pain: Secondary | ICD-10-CM

## 2024-01-11 DIAGNOSIS — K651 Peritoneal abscess: Secondary | ICD-10-CM | POA: Diagnosis present

## 2024-01-11 DIAGNOSIS — E039 Hypothyroidism, unspecified: Secondary | ICD-10-CM | POA: Diagnosis present

## 2024-01-11 DIAGNOSIS — M879 Osteonecrosis, unspecified: Secondary | ICD-10-CM | POA: Diagnosis present

## 2024-01-11 DIAGNOSIS — R110 Nausea: Secondary | ICD-10-CM | POA: Diagnosis not present

## 2024-01-11 DIAGNOSIS — R1031 Right lower quadrant pain: Secondary | ICD-10-CM | POA: Diagnosis not present

## 2024-01-11 DIAGNOSIS — J9601 Acute respiratory failure with hypoxia: Secondary | ICD-10-CM | POA: Diagnosis not present

## 2024-01-11 DIAGNOSIS — R918 Other nonspecific abnormal finding of lung field: Secondary | ICD-10-CM | POA: Diagnosis not present

## 2024-01-11 DIAGNOSIS — K55049 Acute infarction of large intestine, extent unspecified: Secondary | ICD-10-CM | POA: Diagnosis not present

## 2024-01-11 DIAGNOSIS — I2510 Atherosclerotic heart disease of native coronary artery without angina pectoris: Secondary | ICD-10-CM | POA: Diagnosis present

## 2024-01-11 DIAGNOSIS — A419 Sepsis, unspecified organism: Principal | ICD-10-CM | POA: Diagnosis present

## 2024-01-11 DIAGNOSIS — I2781 Cor pulmonale (chronic): Secondary | ICD-10-CM | POA: Diagnosis not present

## 2024-01-11 DIAGNOSIS — K625 Hemorrhage of anus and rectum: Secondary | ICD-10-CM | POA: Diagnosis not present

## 2024-01-11 DIAGNOSIS — I97121 Postprocedural cardiac arrest following other surgery: Secondary | ICD-10-CM | POA: Diagnosis not present

## 2024-01-11 DIAGNOSIS — K567 Ileus, unspecified: Secondary | ICD-10-CM | POA: Diagnosis present

## 2024-01-11 DIAGNOSIS — N400 Enlarged prostate without lower urinary tract symptoms: Secondary | ICD-10-CM | POA: Diagnosis not present

## 2024-01-11 DIAGNOSIS — T81328A Disruption or dehiscence of closure of other specified internal operation (surgical) wound, initial encounter: Secondary | ICD-10-CM | POA: Diagnosis not present

## 2024-01-11 DIAGNOSIS — D72829 Elevated white blood cell count, unspecified: Secondary | ICD-10-CM | POA: Diagnosis not present

## 2024-01-11 DIAGNOSIS — Z8249 Family history of ischemic heart disease and other diseases of the circulatory system: Secondary | ICD-10-CM

## 2024-01-11 DIAGNOSIS — M25561 Pain in right knee: Secondary | ICD-10-CM | POA: Diagnosis not present

## 2024-01-11 DIAGNOSIS — T8143XA Infection following a procedure, organ and space surgical site, initial encounter: Secondary | ICD-10-CM | POA: Diagnosis not present

## 2024-01-11 DIAGNOSIS — Y92239 Unspecified place in hospital as the place of occurrence of the external cause: Secondary | ICD-10-CM | POA: Diagnosis not present

## 2024-01-11 DIAGNOSIS — D689 Coagulation defect, unspecified: Secondary | ICD-10-CM

## 2024-01-11 DIAGNOSIS — N179 Acute kidney failure, unspecified: Secondary | ICD-10-CM | POA: Diagnosis present

## 2024-01-11 DIAGNOSIS — K5720 Diverticulitis of large intestine with perforation and abscess without bleeding: Secondary | ICD-10-CM | POA: Diagnosis not present

## 2024-01-11 DIAGNOSIS — Z7901 Long term (current) use of anticoagulants: Secondary | ICD-10-CM

## 2024-01-11 DIAGNOSIS — Z432 Encounter for attention to ileostomy: Secondary | ICD-10-CM | POA: Diagnosis not present

## 2024-01-11 DIAGNOSIS — K922 Gastrointestinal hemorrhage, unspecified: Secondary | ICD-10-CM | POA: Insufficient documentation

## 2024-01-11 DIAGNOSIS — K658 Other peritonitis: Secondary | ICD-10-CM | POA: Diagnosis not present

## 2024-01-11 DIAGNOSIS — R140 Abdominal distension (gaseous): Secondary | ICD-10-CM | POA: Diagnosis not present

## 2024-01-11 DIAGNOSIS — Z79899 Other long term (current) drug therapy: Secondary | ICD-10-CM

## 2024-01-11 DIAGNOSIS — F05 Delirium due to known physiological condition: Secondary | ICD-10-CM | POA: Diagnosis not present

## 2024-01-11 DIAGNOSIS — E11649 Type 2 diabetes mellitus with hypoglycemia without coma: Secondary | ICD-10-CM | POA: Diagnosis not present

## 2024-01-11 DIAGNOSIS — T794XXA Traumatic shock, initial encounter: Secondary | ICD-10-CM | POA: Diagnosis not present

## 2024-01-11 DIAGNOSIS — E876 Hypokalemia: Secondary | ICD-10-CM | POA: Diagnosis not present

## 2024-01-11 DIAGNOSIS — Z1611 Resistance to penicillins: Secondary | ICD-10-CM | POA: Diagnosis present

## 2024-01-11 DIAGNOSIS — Z833 Family history of diabetes mellitus: Secondary | ICD-10-CM

## 2024-01-11 DIAGNOSIS — K631 Perforation of intestine (nontraumatic): Secondary | ICD-10-CM | POA: Diagnosis present

## 2024-01-11 DIAGNOSIS — R531 Weakness: Secondary | ICD-10-CM | POA: Diagnosis not present

## 2024-01-11 DIAGNOSIS — I272 Pulmonary hypertension, unspecified: Secondary | ICD-10-CM | POA: Diagnosis present

## 2024-01-11 DIAGNOSIS — D649 Anemia, unspecified: Secondary | ICD-10-CM | POA: Insufficient documentation

## 2024-01-11 DIAGNOSIS — D122 Benign neoplasm of ascending colon: Secondary | ICD-10-CM | POA: Diagnosis not present

## 2024-01-11 DIAGNOSIS — I469 Cardiac arrest, cause unspecified: Secondary | ICD-10-CM | POA: Diagnosis not present

## 2024-01-11 DIAGNOSIS — K55019 Acute (reversible) ischemia of small intestine, extent unspecified: Secondary | ICD-10-CM | POA: Diagnosis not present

## 2024-01-11 DIAGNOSIS — G9341 Metabolic encephalopathy: Secondary | ICD-10-CM | POA: Diagnosis not present

## 2024-01-11 DIAGNOSIS — E871 Hypo-osmolality and hyponatremia: Secondary | ICD-10-CM | POA: Diagnosis present

## 2024-01-11 DIAGNOSIS — K55069 Acute infarction of intestine, part and extent unspecified: Secondary | ICD-10-CM | POA: Diagnosis not present

## 2024-01-11 DIAGNOSIS — K5792 Diverticulitis of intestine, part unspecified, without perforation or abscess without bleeding: Secondary | ICD-10-CM | POA: Diagnosis not present

## 2024-01-11 DIAGNOSIS — D123 Benign neoplasm of transverse colon: Secondary | ICD-10-CM | POA: Diagnosis not present

## 2024-01-11 DIAGNOSIS — F17290 Nicotine dependence, other tobacco product, uncomplicated: Secondary | ICD-10-CM | POA: Diagnosis present

## 2024-01-11 DIAGNOSIS — J984 Other disorders of lung: Secondary | ICD-10-CM | POA: Diagnosis not present

## 2024-01-11 DIAGNOSIS — D509 Iron deficiency anemia, unspecified: Secondary | ICD-10-CM

## 2024-01-11 DIAGNOSIS — Z4682 Encounter for fitting and adjustment of non-vascular catheter: Secondary | ICD-10-CM | POA: Diagnosis not present

## 2024-01-11 DIAGNOSIS — D6489 Other specified anemias: Secondary | ICD-10-CM | POA: Diagnosis present

## 2024-01-11 DIAGNOSIS — Z87891 Personal history of nicotine dependence: Secondary | ICD-10-CM | POA: Diagnosis not present

## 2024-01-11 DIAGNOSIS — K91840 Postprocedural hemorrhage and hematoma of a digestive system organ or structure following a digestive system procedure: Secondary | ICD-10-CM | POA: Diagnosis not present

## 2024-01-11 DIAGNOSIS — R571 Hypovolemic shock: Secondary | ICD-10-CM | POA: Diagnosis not present

## 2024-01-11 DIAGNOSIS — B952 Enterococcus as the cause of diseases classified elsewhere: Secondary | ICD-10-CM | POA: Diagnosis not present

## 2024-01-11 DIAGNOSIS — N5089 Other specified disorders of the male genital organs: Secondary | ICD-10-CM | POA: Diagnosis not present

## 2024-01-11 DIAGNOSIS — L02211 Cutaneous abscess of abdominal wall: Secondary | ICD-10-CM | POA: Diagnosis not present

## 2024-01-11 DIAGNOSIS — K6389 Other specified diseases of intestine: Secondary | ICD-10-CM | POA: Diagnosis not present

## 2024-01-11 DIAGNOSIS — R55 Syncope and collapse: Secondary | ICD-10-CM | POA: Diagnosis not present

## 2024-01-11 LAB — CBC WITH DIFFERENTIAL/PLATELET
Abs Immature Granulocytes: 0.12 10*3/uL — ABNORMAL HIGH (ref 0.00–0.07)
Abs Immature Granulocytes: 0.31 10*3/uL — ABNORMAL HIGH (ref 0.00–0.07)
Basophils Absolute: 0 10*3/uL (ref 0.0–0.1)
Basophils Absolute: 0 10*3/uL (ref 0.0–0.1)
Basophils Relative: 0 %
Basophils Relative: 0 %
Eosinophils Absolute: 0 10*3/uL (ref 0.0–0.5)
Eosinophils Absolute: 0 10*3/uL (ref 0.0–0.5)
Eosinophils Relative: 0 %
Eosinophils Relative: 0 %
HCT: 28.6 % — ABNORMAL LOW (ref 39.0–52.0)
HCT: 23.6 % — ABNORMAL LOW (ref 39.0–52.0)
Hemoglobin: 8 g/dL — ABNORMAL LOW (ref 13.0–17.0)
Hemoglobin: 6.7 g/dL — CL (ref 13.0–17.0)
Immature Granulocytes: 1 %
Immature Granulocytes: 2 %
Lymphocytes Relative: 6 %
Lymphocytes Relative: 7 %
Lymphs Abs: 1.1 10*3/uL (ref 0.7–4.0)
Lymphs Abs: 1.1 10*3/uL (ref 0.7–4.0)
MCH: 21.3 pg — ABNORMAL LOW (ref 26.0–34.0)
MCH: 21.8 pg — ABNORMAL LOW (ref 26.0–34.0)
MCHC: 28 g/dL — ABNORMAL LOW (ref 30.0–36.0)
MCHC: 28.4 g/dL — ABNORMAL LOW (ref 30.0–36.0)
MCV: 76.1 fL — ABNORMAL LOW (ref 80.0–100.0)
MCV: 76.6 fL — ABNORMAL LOW (ref 80.0–100.0)
Monocytes Absolute: 0.9 10*3/uL (ref 0.1–1.0)
Monocytes Absolute: 0.9 10*3/uL (ref 0.1–1.0)
Monocytes Relative: 5 %
Monocytes Relative: 6 %
Neutro Abs: 16.1 10*3/uL — ABNORMAL HIGH (ref 1.7–7.7)
Neutro Abs: 12.4 10*3/uL — ABNORMAL HIGH (ref 1.7–7.7)
Neutrophils Relative %: 88 %
Neutrophils Relative %: 85 %
Platelets: 393 10*3/uL (ref 150–400)
Platelets: 338 10*3/uL (ref 150–400)
RBC: 3.76 MIL/uL — ABNORMAL LOW (ref 4.22–5.81)
RBC: 3.08 MIL/uL — ABNORMAL LOW (ref 4.22–5.81)
RDW: 21.1 % — ABNORMAL HIGH (ref 11.5–15.5)
RDW: 21.2 % — ABNORMAL HIGH (ref 11.5–15.5)
WBC: 18.3 10*3/uL — ABNORMAL HIGH (ref 4.0–10.5)
WBC: 14.7 10*3/uL — ABNORMAL HIGH (ref 4.0–10.5)
nRBC: 0 % (ref 0.0–0.2)
nRBC: 0 % (ref 0.0–0.2)

## 2024-01-11 LAB — COMPREHENSIVE METABOLIC PANEL WITH GFR
ALT: 8 U/L (ref 0–44)
AST: 27 U/L (ref 15–41)
Albumin: 3.3 g/dL — ABNORMAL LOW (ref 3.5–5.0)
Alkaline Phosphatase: 56 U/L (ref 38–126)
Anion gap: 8 (ref 5–15)
BUN: 19 mg/dL (ref 8–23)
CO2: 21 mmol/L — ABNORMAL LOW (ref 22–32)
Calcium: 8.3 mg/dL — ABNORMAL LOW (ref 8.9–10.3)
Chloride: 108 mmol/L (ref 98–111)
Creatinine, Ser: 1.42 mg/dL — ABNORMAL HIGH (ref 0.61–1.24)
GFR, Estimated: 56 mL/min — ABNORMAL LOW (ref >60)
Glucose, Bld: 108 mg/dL — ABNORMAL HIGH (ref 70–99)
Potassium: 4.7 mmol/L (ref 3.5–5.1)
Sodium: 137 mmol/L (ref 135–145)
Total Bilirubin: 1.4 mg/dL — ABNORMAL HIGH (ref 0.0–1.2)
Total Protein: 7.7 g/dL (ref 6.5–8.1)

## 2024-01-11 LAB — CK: Total CK: 87 U/L (ref 49–397)

## 2024-01-11 LAB — TSH: TSH: 1.512 u[IU]/mL (ref 0.350–4.500)

## 2024-01-11 LAB — BASIC METABOLIC PANEL WITH GFR
Anion gap: 8 (ref 5–15)
BUN: 17 mg/dL (ref 8–23)
CO2: 20 mmol/L — ABNORMAL LOW (ref 22–32)
Calcium: 7.7 mg/dL — ABNORMAL LOW (ref 8.9–10.3)
Chloride: 106 mmol/L (ref 98–111)
Creatinine, Ser: 1.08 mg/dL (ref 0.61–1.24)
GFR, Estimated: >60 mL/min (ref >60)
Glucose, Bld: 129 mg/dL — ABNORMAL HIGH (ref 70–99)
Potassium: 3.6 mmol/L (ref 3.5–5.1)
Sodium: 134 mmol/L — ABNORMAL LOW (ref 135–145)

## 2024-01-11 LAB — PREPARE RBC (CROSSMATCH)

## 2024-01-11 LAB — BETA-HYDROXYBUTYRIC ACID: Beta-Hydroxybutyric Acid: 0.07 mmol/L (ref 0.05–0.27)

## 2024-01-11 LAB — POC OCCULT BLOOD, ED: Fecal Occult Bld: POSITIVE — AB

## 2024-01-11 LAB — RETICULOCYTES
Immature Retic Fract: 25.8 % — ABNORMAL HIGH (ref 2.3–15.9)
RBC.: 3.09 MIL/uL — ABNORMAL LOW (ref 4.22–5.81)
Retic Count, Absolute: 23.5 10*3/uL (ref 19.0–186.0)
Retic Ct Pct: 0.8 % (ref 0.4–3.1)

## 2024-01-11 LAB — FERRITIN: Ferritin: 19 ng/mL — ABNORMAL LOW (ref 24–336)

## 2024-01-11 LAB — IRON AND TIBC
Iron: 11 ug/dL — ABNORMAL LOW (ref 45–182)
Saturation Ratios: 4 % — ABNORMAL LOW (ref 17.9–39.5)
TIBC: 293 ug/dL (ref 250–450)
UIBC: 282 ug/dL

## 2024-01-11 LAB — PROTIME-INR
INR: 2.2 — ABNORMAL HIGH (ref 0.8–1.2)
Prothrombin Time: 25 s — ABNORMAL HIGH (ref 11.4–15.2)

## 2024-01-11 LAB — MAGNESIUM: Magnesium: 2.1 mg/dL (ref 1.7–2.4)

## 2024-01-11 LAB — LACTIC ACID, PLASMA: Lactic Acid, Venous: 0.8 mmol/L (ref 0.5–1.9)

## 2024-01-11 LAB — PROCALCITONIN: Procalcitonin: 0.94 ng/mL

## 2024-01-11 LAB — PHOSPHORUS: Phosphorus: 2.7 mg/dL (ref 2.5–4.6)

## 2024-01-11 MED ORDER — ONDANSETRON HCL 4 MG/2ML IJ SOLN
4.0000 mg | Freq: Four times a day (QID) | INTRAMUSCULAR | Status: DC | PRN
  Administered 2024-01-12 – 2024-01-15 (×5): 4 mg via INTRAVENOUS
  Filled 2024-01-11 (×6): qty 2

## 2024-01-11 MED ORDER — FENTANYL CITRATE PF 50 MCG/ML IJ SOSY
12.5000 ug | PREFILLED_SYRINGE | INTRAMUSCULAR | Status: DC | PRN
  Administered 2024-01-11 – 2024-01-15 (×26): 50 ug via INTRAVENOUS
  Filled 2024-01-11 (×27): qty 1

## 2024-01-11 MED ORDER — HYDROMORPHONE HCL 1 MG/ML IJ SOLN
1.0000 mg | Freq: Once | INTRAMUSCULAR | Status: AC
  Administered 2024-01-11: 1 mg via INTRAVENOUS
  Filled 2024-01-11: qty 1

## 2024-01-11 MED ORDER — PIPERACILLIN-TAZOBACTAM 3.375 G IVPB
3.3750 g | Freq: Three times a day (TID) | INTRAVENOUS | Status: DC
  Administered 2024-01-12 – 2024-01-17 (×17): 3.375 g via INTRAVENOUS
  Filled 2024-01-11 (×17): qty 50

## 2024-01-11 MED ORDER — ONDANSETRON HCL 4 MG PO TABS: 4.0000 mg | ORAL_TABLET | Freq: Four times a day (QID) | ORAL | Status: DC | PRN

## 2024-01-11 MED ORDER — ACETAMINOPHEN 325 MG PO TABS
650.0000 mg | ORAL_TABLET | Freq: Four times a day (QID) | ORAL | Status: DC | PRN
  Administered 2024-01-12: 650 mg via ORAL
  Filled 2024-01-11: qty 2

## 2024-01-11 MED ORDER — SODIUM CHLORIDE 0.9% IV SOLUTION: Freq: Once | INTRAVENOUS | Status: DC

## 2024-01-11 MED ORDER — SODIUM CHLORIDE 0.9 % IV BOLUS
1000.0000 mL | Freq: Once | INTRAVENOUS | Status: AC
  Administered 2024-01-11: 1000 mL via INTRAVENOUS

## 2024-01-11 MED ORDER — PIPERACILLIN-TAZOBACTAM 3.375 G IVPB 30 MIN
3.3750 g | Freq: Once | INTRAVENOUS | Status: AC
  Administered 2024-01-11: 3.375 g via INTRAVENOUS
  Filled 2024-01-11: qty 50

## 2024-01-11 MED ORDER — IOHEXOL 300 MG/ML  SOLN
100.0000 mL | Freq: Once | INTRAMUSCULAR | Status: AC | PRN
  Administered 2024-01-11: 100 mL via INTRAVENOUS

## 2024-01-11 MED ORDER — ACETAMINOPHEN 650 MG RE SUPP
650.0000 mg | Freq: Four times a day (QID) | RECTAL | Status: DC | PRN
  Administered 2024-01-11: 650 mg via RECTAL
  Filled 2024-01-11: qty 1

## 2024-01-11 MED ORDER — ACETAMINOPHEN 500 MG PO TABS: 1000.0000 mg | ORAL_TABLET | Freq: Once | ORAL | Status: DC

## 2024-01-11 MED ORDER — ONDANSETRON HCL 4 MG/2ML IJ SOLN
4.0000 mg | Freq: Once | INTRAMUSCULAR | Status: AC
  Administered 2024-01-11: 4 mg via INTRAVENOUS
  Filled 2024-01-11: qty 2

## 2024-01-11 MED ORDER — LACTATED RINGERS IV BOLUS (SEPSIS)
1500.0000 mL | Freq: Once | INTRAVENOUS | Status: AC
  Administered 2024-01-11: 1500 mL via INTRAVENOUS

## 2024-01-11 MED ORDER — HYDROCODONE-ACETAMINOPHEN 5-325 MG PO TABS
1.0000 | ORAL_TABLET | ORAL | Status: DC | PRN
  Administered 2024-01-12 – 2024-01-14 (×5): 2 via ORAL
  Administered 2024-01-14: 1 via ORAL
  Administered 2024-01-14 (×2): 2 via ORAL
  Filled 2024-01-11 (×9): qty 2

## 2024-01-11 MED ORDER — LACTATED RINGERS IV SOLN: INTRAVENOUS | Status: DC

## 2024-01-11 MED ORDER — SODIUM CHLORIDE 0.9 % IV SOLN: INTRAVENOUS | Status: DC

## 2024-01-11 NOTE — ED Notes (Signed)
 Pt refused orthostatics vitals because he doesn't want to get up now but will do it later.

## 2024-01-11 NOTE — Assessment & Plan Note (Addendum)
 In the setting of diverticulitis So far no recurrent bleeding in ER  Will need follow up with GI to exclude mass as per CT

## 2024-01-11 NOTE — Assessment & Plan Note (Signed)
-  SIRS criteria met with  elevated white blood cell count,       Component Value Date/Time   WBC 14.7 (H) 01/11/2024 1957   LYMPHSABS PENDING 01/11/2024 1957   LYMPHSABS 1.4 04/27/2019 0000     tachycardia   ,   fever   RR >20 Today's Vitals   01/11/24 1927 01/11/24 1927 01/11/24 1946 01/11/24 2005  BP:      Pulse:      Resp:      Temp:   (!) 101.7 F (38.7 C)   TempSrc:   Oral   SpO2:      Weight:      Height:      PainSc: 10-Worst pain ever 10-Worst pain ever  10-Worst pain ever   Body mass index is 26.27 kg/m.    -Most likely source being: , intra-abdominal,        - Obtain serial lactic acid and procalcitonin level.  - Initiated IV antibiotics in ER: Antibiotics Given (last 72 hours)     Date/Time Action Medication Dose Rate   01/11/24 1829 New Bag/Given   piperacillin-tazobactam (ZOSYN) IVPB 3.375 g 3.375 g 100 mL/hr       Will continue  on : Zosyn   - await results of blood and urine culture  - Rehydrate aggressively  Intravenous fluids were administered,      8:22 PM

## 2024-01-11 NOTE — Assessment & Plan Note (Signed)
 Will transfuse 2 units and monitor hemoglobin.
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
## 2024-01-11 NOTE — Progress Notes (Signed)
 Pt refused to answer the admission questions this time, and stated he want to sleep. RN will recheck the pt.

## 2024-01-11 NOTE — ED Provider Notes (Signed)
 [place] EMERGENCY DEPARTMENT AT [facility] Provider Note   CSN: [id number] Arrival date & time: 01/11/24  1425     History  Chief Complaint  Patient presents with   Rectal Bleeding   Hypotension    Alexander [name] is a 63 y.o. male.  HPI   63 year old male presents emergency department with concern for abdominal pain and an episode of large blood per rectum.  Patient states that he tried to go to the restroom to have a bowel movement.  While walking there he felt faint like he was get a pass out.  The person that was with him helped lower him to the ground when he had a large bowel movement of stool mixed in with blood clots as he describes it.  Patient has been compliant with his Coumadin.  History of diverticulosis but has never had bleeding per rectum like this.  He has had about a day of generalized abdominal pain.  Denies any fever or chills.  Has had a significant decrease in appetite/p.o. intake but no vomiting or genitourinary symptoms.  Home Medications Prior to Admission medications   Medication Sig Start Date End Date Taking? Authorizing Provider  acetaminophen (TYLENOL) 325 MG tablet Take 2 tablets (650 mg total) by mouth every 6 (six) hours as needed for mild pain (pain score 1-3) (or Fever >/= 101). 07/23/23   Marguerita Merles Latif, DO  mupirocin ointment (BACTROBAN) 2 % Apply to affected area 1-2 times daily 12/03/23   Jarold Motto, PA  polyethylene glycol powder (GLYCOLAX/MIRALAX) 17 GM/SCOOP powder Mix as directed and take 17 g by mouth daily. Patient not taking: Reported on 12/03/2023 07/23/23   Marguerita Merles Latif, DO  warfarin (COUMADIN) 5 MG tablet TAKE 2 TABLETS BY MOUTH DAILY OR AS DIRECTED BY ANTICOAGULATION CLINIC 12/24/23   Jarold Motto, PA      Allergies    Patient has no known allergies.    Review of Systems   Review of Systems  Constitutional:  Positive for appetite change, chills and fatigue. Negative for fever.  Respiratory:  Negative  for shortness of breath.   Cardiovascular:  Negative for chest pain.  Gastrointestinal:  Positive for abdominal pain, anal bleeding, blood in stool, constipation and nausea. Negative for diarrhea and vomiting.  Skin:  Negative for rash.  Neurological:  Negative for headaches.    Physical Exam Updated Vital Signs BP 131/75   Pulse (!) 103   Temp 98.4 F (36.9 C) (Oral)   Resp 19   Ht 5\' 9"  (1.753 m)   Wt 80.7 kg   SpO2 99%   BMI 26.27 kg/m  Physical Exam Vitals and nursing note reviewed.  Constitutional:      General: He is not in acute distress.    Appearance: Normal appearance.  HENT:     Head: Normocephalic.     Mouth/Throat:     Mouth: Mucous membranes are moist.  Cardiovascular:     Rate and Rhythm: Tachycardia present.  Pulmonary:     Effort: Pulmonary effort is normal. No respiratory distress.  Abdominal:     General: Bowel sounds are normal. There is no distension.     Palpations: Abdomen is soft.     Tenderness: There is abdominal tenderness. There is no guarding or rebound.  Genitourinary:    Rectum: Guaiac result positive.  Skin:    General: Skin is warm.  Neurological:     Mental Status: He is alert and oriented to person, place, and time. Mental  status is at baseline.  Psychiatric:        Mood and Affect: Mood normal.     ED Results / Procedures / Treatments   Labs (all labs ordered are listed, but only abnormal results are displayed) Labs Reviewed  COMPREHENSIVE METABOLIC PANEL WITH GFR - Abnormal; Notable for the following components:      Result Value   CO2 21 (*)    Glucose, Bld 108 (*)    Creatinine, Ser 1.42 (*)    Calcium 8.3 (*)    Albumin 3.3 (*)    Total Bilirubin 1.4 (*)    GFR, Estimated 56 (*)    All other components within normal limits  CBC WITH DIFFERENTIAL/PLATELET - Abnormal; Notable for the following components:   WBC 18.3 (*)    RBC 3.76 (*)    Hemoglobin 8.0 (*)    HCT 28.6 (*)    MCV 76.1 (*)    MCH 21.3 (*)    MCHC  28.0 (*)    RDW 21.1 (*)    Neutro Abs 16.1 (*)    Abs Immature Granulocytes 0.12 (*)    All other components within normal limits  PROTIME-INR - Abnormal; Notable for the following components:   Prothrombin Time 25.0 (*)    INR 2.2 (*)    All other components within normal limits  LACTIC ACID, PLASMA  POC OCCULT BLOOD, ED  TYPE AND SCREEN    EKG None  Radiology No results found.  Procedures .Critical Care  Performed by: Rozelle Logan, DO Authorized by: Rozelle Logan, DO   Critical care provider statement:    Critical care time (minutes):  30   Critical care time was exclusive of:  Separately billable procedures and treating other patients   Critical care was necessary to treat or prevent imminent or life-threatening deterioration of the following conditions:  Sepsis   Critical care was time spent personally by me on the following activities:  Development of treatment plan with patient or surrogate, discussions with consultants, evaluation of patient's response to treatment, examination of patient, ordering and review of laboratory studies, ordering and review of radiographic studies, ordering and performing treatments and interventions, pulse oximetry, re-evaluation of patient's condition and review of old charts   I assumed direction of critical care for this patient from another provider in my specialty: no     Care discussed with: admitting provider       Medications Ordered in ED Medications  sodium chloride 0.9 % bolus 1,000 mL (has no administration in time range)  iohexol (OMNIPAQUE) 300 MG/ML solution 100 mL (100 mLs Intravenous Contrast Given 01/11/24 1621)    ED Course/ Medical Decision Making/ A&P                                 Medical Decision Making Amount and/or Complexity of Data Reviewed Labs: ordered. Radiology: ordered.  Risk OTC drugs. Prescription drug management. Decision regarding hospitalization.   63 year old male presents to  the emergency department after a near syncopal episode and bleeding/clots with a large bowel movement.  Anticoagulated on Coumadin and compliance.  History of diverticulosis/diverticulitis.  Complaining of generalized abdominal pain and decreased appetite.  Patient is tachycardic on arrival.  Abdomen is generally tender.  Rectal exam shows frank blood/hematochezia.  Blood work reveals leukocytosis, anemia of 8.  His baseline appears to be around 10, of note about a month ago he had acute  anemia into the sevens that required transfusion.  Mild AKI noted.  CT of the abdomen pelvis shows inflammatory/stranding findings around the ascending colon, consistent with diverticulitis.  On reevaluation patient's temperature is uptrending.  Will activate code sepsis, Zosyn ordered.  Type and screen pending.  Tachycardia improving with fluids.  Patient now complaining of pain and nausea which we will treat appropriately.  Patients evaluation and results requires admission for further treatment and care.  Spoke with hospitalist, reviewed patient's ED course and they accept admission.  Patient agrees with admission plan, offers no new complaints and is stable/unchanged at time of admit.        Final Clinical Impression(s) / ED Diagnoses Final diagnoses:  None    Rx / DC Orders ED Discharge Orders     None         Flonnie Humphrey, DO 01/11/24 1854

## 2024-01-11 NOTE — Assessment & Plan Note (Signed)
-    evidence of acute renal failure due to presence of following: Cr increased >0.3 from baseline        check FeNA       Rehydrate with IV fluids      HOLD   nephrotoxic medications  History does not suggest urinary retention or obstruction

## 2024-01-11 NOTE — Sepsis Progress Note (Addendum)
 Elink following code sepsis.  2300- Confirmed with ED RN via secure chat that blood cultures were drawn before antibiotics were administered. Will continue to monitor code sepsis.

## 2024-01-11 NOTE — Subjective & Objective (Signed)
 Weakness and syncope with blood per rectum and abd pain In ER BRBPR large BM  Was tachycardic and elevated WBC CT showed diverticulits  HG 8 Was not aware of fever or chills Started on Zosyn  Hx of PE/DVT reccurent  on coumadin INR 2.2

## 2024-01-11 NOTE — Assessment & Plan Note (Addendum)
 Hold Coumadin for today given episodes of lower GI bleed Currently bleeding has slowed down If there is evidence of recurrent significant bleeding may need reversal

## 2024-01-11 NOTE — Assessment & Plan Note (Signed)
>>  ASSESSMENT AND PLAN FOR HISTORY OF PULMONARY EMBOLISM WRITTEN ON 01/11/2024  8:19 PM BY DOUTOVA, ANASTASSIA, MD  Hold Coumadin  for today given episodes of lower GI bleed Currently bleeding has slowed down If there is evidence of recurrent significant bleeding may need reversal

## 2024-01-11 NOTE — Assessment & Plan Note (Signed)
 Allow permissive hypertension

## 2024-01-11 NOTE — Assessment & Plan Note (Signed)
-   no Evidence of perforation - Bowel rest NPO  - Will rehydrate  - Continue IV antibiotics as patient was unable to tolerate p.o. due to significant nausea                  started on   zosyn on 01/11/24

## 2024-01-11 NOTE — ED Triage Notes (Addendum)
 Pt BIB EMS from home with reports of weakness and syncope. Pt reports bleeding from his bottom when going to the bathroom along with abdominal pain since today. 20 g left ac 400 ml ns given.

## 2024-01-12 DIAGNOSIS — D72829 Elevated white blood cell count, unspecified: Secondary | ICD-10-CM

## 2024-01-12 DIAGNOSIS — K5732 Diverticulitis of large intestine without perforation or abscess without bleeding: Secondary | ICD-10-CM | POA: Insufficient documentation

## 2024-01-12 DIAGNOSIS — R110 Nausea: Secondary | ICD-10-CM

## 2024-01-12 DIAGNOSIS — K5792 Diverticulitis of intestine, part unspecified, without perforation or abscess without bleeding: Secondary | ICD-10-CM | POA: Diagnosis not present

## 2024-01-12 DIAGNOSIS — K921 Melena: Secondary | ICD-10-CM | POA: Diagnosis not present

## 2024-01-12 DIAGNOSIS — Z86711 Personal history of pulmonary embolism: Secondary | ICD-10-CM | POA: Diagnosis not present

## 2024-01-12 DIAGNOSIS — A419 Sepsis, unspecified organism: Secondary | ICD-10-CM | POA: Diagnosis not present

## 2024-01-12 DIAGNOSIS — D509 Iron deficiency anemia, unspecified: Secondary | ICD-10-CM | POA: Diagnosis not present

## 2024-01-12 LAB — COMPREHENSIVE METABOLIC PANEL WITH GFR
ALT: 7 U/L (ref 0–44)
AST: 16 U/L (ref 15–41)
Albumin: 3.1 g/dL — ABNORMAL LOW (ref 3.5–5.0)
Alkaline Phosphatase: 51 U/L (ref 38–126)
Anion gap: 9 (ref 5–15)
BUN: 17 mg/dL (ref 8–23)
CO2: 21 mmol/L — ABNORMAL LOW (ref 22–32)
Calcium: 8.1 mg/dL — ABNORMAL LOW (ref 8.9–10.3)
Chloride: 106 mmol/L (ref 98–111)
Creatinine, Ser: 1.19 mg/dL (ref 0.61–1.24)
GFR, Estimated: >60 mL/min (ref >60)
Glucose, Bld: 104 mg/dL — ABNORMAL HIGH (ref 70–99)
Potassium: 3.9 mmol/L (ref 3.5–5.1)
Sodium: 136 mmol/L (ref 135–145)
Total Bilirubin: 0.7 mg/dL (ref 0.0–1.2)
Total Protein: 7 g/dL (ref 6.5–8.1)

## 2024-01-12 LAB — URINALYSIS, COMPLETE (UACMP) WITH MICROSCOPIC
Bilirubin Urine: NEGATIVE
Glucose, UA: NEGATIVE mg/dL
Ketones, ur: NEGATIVE mg/dL
Leukocytes,Ua: NEGATIVE
Nitrite: NEGATIVE
Protein, ur: NEGATIVE mg/dL
Specific Gravity, Urine: 1.026 (ref 1.005–1.030)
pH: 5 (ref 5.0–8.0)

## 2024-01-12 LAB — LACTIC ACID, PLASMA
Lactic Acid, Venous: 1.5 mmol/L (ref 0.5–1.9)
Lactic Acid, Venous: 2.2 mmol/L (ref 0.5–1.9)

## 2024-01-12 LAB — CBC
HCT: 25.2 % — ABNORMAL LOW (ref 39.0–52.0)
HCT: 26.8 % — ABNORMAL LOW (ref 39.0–52.0)
Hemoglobin: 7.1 g/dL — ABNORMAL LOW (ref 13.0–17.0)
Hemoglobin: 8 g/dL — ABNORMAL LOW (ref 13.0–17.0)
MCH: 21.7 pg — ABNORMAL LOW (ref 26.0–34.0)
MCH: 23.4 pg — ABNORMAL LOW (ref 26.0–34.0)
MCHC: 28.2 g/dL — ABNORMAL LOW (ref 30.0–36.0)
MCHC: 29.9 g/dL — ABNORMAL LOW (ref 30.0–36.0)
MCV: 77.1 fL — ABNORMAL LOW (ref 80.0–100.0)
MCV: 78.4 fL — ABNORMAL LOW (ref 80.0–100.0)
Platelets: 336 10*3/uL (ref 150–400)
Platelets: 263 10*3/uL (ref 150–400)
RBC: 3.27 MIL/uL — ABNORMAL LOW (ref 4.22–5.81)
RBC: 3.42 MIL/uL — ABNORMAL LOW (ref 4.22–5.81)
RDW: 21.1 % — ABNORMAL HIGH (ref 11.5–15.5)
RDW: 20.9 % — ABNORMAL HIGH (ref 11.5–15.5)
WBC: 11.7 10*3/uL — ABNORMAL HIGH (ref 4.0–10.5)
WBC: 16.1 10*3/uL — ABNORMAL HIGH (ref 4.0–10.5)
nRBC: 0 % (ref 0.0–0.2)
nRBC: 0 % (ref 0.0–0.2)

## 2024-01-12 LAB — FOLATE: Folate: 9.3 ng/mL (ref >5.9)

## 2024-01-12 LAB — PROTIME-INR
INR: 2.2 — ABNORMAL HIGH (ref 0.8–1.2)
Prothrombin Time: 24.6 s — ABNORMAL HIGH (ref 11.4–15.2)

## 2024-01-12 LAB — RESP PANEL BY RT-PCR (RSV, FLU A&B, COVID)  RVPGX2
Influenza A by PCR: NEGATIVE
Influenza B by PCR: NEGATIVE
Resp Syncytial Virus by PCR: NEGATIVE
SARS Coronavirus 2 by RT PCR: NEGATIVE

## 2024-01-12 LAB — PHOSPHORUS: Phosphorus: 2.6 mg/dL (ref 2.5–4.6)

## 2024-01-12 LAB — OSMOLALITY: Osmolality: 295 mosm/kg (ref 275–295)

## 2024-01-12 LAB — CREATININE, URINE, RANDOM: Creatinine, Urine: 90 mg/dL

## 2024-01-12 LAB — SODIUM, URINE, RANDOM: Sodium, Ur: 140 mmol/L

## 2024-01-12 LAB — VITAMIN B12: Vitamin B-12: 93 pg/mL — ABNORMAL LOW (ref 180–914)

## 2024-01-12 LAB — MAGNESIUM: Magnesium: 1.7 mg/dL (ref 1.7–2.4)

## 2024-01-12 LAB — OSMOLALITY, URINE: Osmolality, Ur: 595 mosm/kg (ref 300–900)

## 2024-01-12 LAB — MRSA NEXT GEN BY PCR, NASAL: MRSA by PCR Next Gen: DETECTED — AB

## 2024-01-12 MED ORDER — HYDROMORPHONE HCL 1 MG/ML IJ SOLN: 0.5000 mg | Freq: Once | INTRAMUSCULAR | Status: DC | PRN

## 2024-01-12 MED ORDER — LABETALOL HCL 5 MG/ML IV SOLN: 10.0000 mg | INTRAVENOUS | Status: DC | PRN

## 2024-01-12 MED ORDER — ACETAMINOPHEN 650 MG RE SUPP: 650.0000 mg | Freq: Four times a day (QID) | RECTAL | Status: DC | PRN

## 2024-01-12 MED ORDER — ACETAMINOPHEN 10 MG/ML IV SOLN
1000.0000 mg | Freq: Once | INTRAVENOUS | Status: AC
Start: 2024-01-12 — End: 2024-01-12
  Administered 2024-01-12: 1000 mg via INTRAVENOUS
  Filled 2024-01-12: qty 100

## 2024-01-12 MED ORDER — LACTATED RINGERS IV SOLN: INTRAVENOUS | Status: DC

## 2024-01-12 MED ORDER — ACETAMINOPHEN 325 MG PO TABS
650.0000 mg | ORAL_TABLET | ORAL | Status: DC | PRN
  Administered 2024-01-12 – 2024-01-13 (×5): 650 mg via ORAL
  Filled 2024-01-12 (×4): qty 2

## 2024-01-12 MED ORDER — IRON SUCROSE 300 MG IVPB - SIMPLE MED
300.0000 mg | Freq: Every day | Status: DC
  Administered 2024-01-12: 300 mg via INTRAVENOUS
  Filled 2024-01-12: qty 265
  Filled 2024-01-12: qty 300
  Filled 2024-01-12: qty 265

## 2024-01-12 MED ORDER — CHLORHEXIDINE GLUCONATE CLOTH 2 % EX PADS
6.0000 | MEDICATED_PAD | Freq: Every day | CUTANEOUS | Status: AC
  Administered 2024-01-12 – 2024-01-16 (×5): 6 via TOPICAL

## 2024-01-12 MED ORDER — HYDRALAZINE HCL 20 MG/ML IJ SOLN: 10.0000 mg | INTRAMUSCULAR | Status: DC | PRN

## 2024-01-12 MED ORDER — MUPIROCIN 2 % EX OINT
1.0000 | TOPICAL_OINTMENT | Freq: Two times a day (BID) | CUTANEOUS | Status: AC
  Administered 2024-01-12 – 2024-01-16 (×9): 1 via NASAL
  Filled 2024-01-12 (×2): qty 22

## 2024-01-12 MED ORDER — SODIUM CHLORIDE 0.9 % IV BOLUS
500.0000 mL | Freq: Once | INTRAVENOUS | Status: AC
  Administered 2024-01-12: 500 mL via INTRAVENOUS

## 2024-01-12 NOTE — Progress Notes (Signed)
 Progress Note    Alexander [name]   EXB:[id number]  DOB: October 29, 1960  DOA: 01/11/2024     1 PCP: Alexander Iba, PA  Initial CC: abdominal pain  [name] Course: [name] is a 63 yo male with PMH diverticulitis, HTN, PAD, PE/Alexander [name], gout, arthritis who presented with rectal bleeding, abdominal pain, and found to have hypotension. CT abdomen/pelvis showed "inflammatory process" involving proximal ascending colon including terminal ileum with wall thickening.  Findings were concerning for diverticulitis with also other etiologies. Hemoglobin 7.6 g/dL on admission and further down trended to 6.7 g/dL.  He was given 1 unit PRBC. Coumadin was held and he was started on antibiotics.  Alexander [name] was also consulted on admission.  Interval History:  Resting comfortably in bed.  Still having ongoing abdominal pain.  Understands plan for antibiotics and blood. We will continue liquid diet today and monitoring.  Assessment and Plan: * Sepsis (HCC) - Fever, tachycardia, tachypnea, leukocytosis.  Presumed abdominal infection with suspected diverticulitis - Continue Alexander [name]  Diverticulitis - Not quite consistent with only diverticulitis as possible underlying neoplasm not excluded from CT -For now, treating as diverticulitis with eventual plan for outpatient colonoscopy.  He has never had one -CT notes that inflammatory process involves the proximal ascending colon including terminal ileum -Continue Alexander [name] -Continue clear liquids - Continue fluids for today  History of pulmonary embolism - On chronic Coumadin at home -INR therapeutic on admission - Coumadin on hold for now in setting of rectal bleeding and severe anemia  Iron deficiency anemia - Folate normal, B12 severely low (93) -Ferritin low and iron labs also low.  Will try to replete some iron via IV - Also repleting B12  AKI (acute kidney injury) (HCC) - baseline creatinine ~ 1 - patient presents with increase in creat >0.3 mg/dL above  baseline or creat increase >1.5x baseline presumed to have occurred within past 7 days PTA - creat 1.42 on admission - presumed pre-renal; continue IVF   Essential hypertension, benign - Mostly normotensive - Will add PRN meds in case needed   Old records reviewed in assessment of this patient  Antimicrobials: Alexander [name] 01/11/2024 >> current  Alexander [name] prophylaxis:  Alexander [name] Start: 01/11/24 1948   [name] Status:   [name] Status: Alexander [name]  Mobility Assessment (Last 72 Hours)     Mobility Assessment     Row Name 01/11/24 2110           Does patient have an order for bedrest or is patient medically unstable No - Continue assessment       What is the highest level of mobility based on the progressive mobility assessment? Level 3 (Stands with assist) - Balance while standing  and cannot march in place       Is the above level different from baseline mobility prior to current illness? Yes - Recommend Alexander [name] order                Barriers to discharge: none Disposition Plan:  Home HH orders placed: n/a Status is: Inpt  Objective: Blood pressure (!) 141/75, pulse (!) 112, temperature (!) 101.1 F (38.4 C), temperature source Oral, resp. rate 20, height 5\' 9"  (1.753 m), weight 80.7 kg, SpO2 99%.  Examination:  Physical Exam Constitutional:      General: He is not in acute distress.    Appearance: Normal appearance.  HENT:     Head: Normocephalic and atraumatic.     Mouth/Throat:     Mouth: Mucous membranes are moist.  Eyes:  Extraocular Movements: Extraocular movements intact.  Cardiovascular:     Rate and Rhythm: Normal rate and regular rhythm.  Pulmonary:     Effort: Pulmonary effort is normal. No respiratory distress.     Breath sounds: Normal breath sounds. No wheezing.  Abdominal:     General: Bowel sounds are normal. There is no distension.     Palpations: Abdomen is soft.     Tenderness: There is abdominal tenderness in the right upper quadrant and epigastric area.   Musculoskeletal:        General: Normal range of motion.     Cervical back: Normal range of motion and neck supple.  Skin:    General: Skin is warm and dry.  Neurological:     General: No focal deficit present.     Mental Status: He is alert.  Psychiatric:        Mood and Affect: Mood normal.        Behavior: Behavior normal.      Consultants:  Alexander [name]  Procedures:    Data Reviewed: Results for orders placed or performed during the [name] encounter of 01/11/24 (from the past 24 hours)  Type and screen Alexander [name]     Status: None (Preliminary result)   Collection Time: 01/11/24  2:45 PM  Result Value Ref Range   ABO/RH(D) B POS    Antibody Screen NEG    Sample Expiration 01/14/2024,2359    Unit Number [id number]    Blood Component Type RED CELLS,LR    Unit division 00    Status of Unit ISSUED    Transfusion Status OK TO TRANSFUSE    Crossmatch Result      Compatible Performed at [facility], [address] [place], [place] [address]    Unit Number [id number]    Blood Component Type RED CELLS,LR    Unit division 00    Status of Unit ISSUED    Transfusion Status OK TO TRANSFUSE    Crossmatch Result Compatible   Comprehensive metabolic panel     Status: Abnormal   Collection Time: 01/11/24  2:58 PM  Result Value Ref Range   Sodium 137 135 - 145 mmol/L   Potassium 4.7 3.5 - 5.1 mmol/L   Chloride 108 98 - 111 mmol/L   CO2 21 (L) 22 - 32 mmol/L   Glucose, Bld 108 (H) 70 - 99 mg/dL   BUN 19 8 - 23 mg/dL   Creatinine, Ser 7.84 (H) 0.61 - 1.24 mg/dL   Calcium 8.3 (L) 8.9 - 10.3 mg/dL   Total Protein 7.7 6.5 - 8.1 g/dL   Albumin 3.3 (L) 3.5 - 5.0 g/dL   AST 27 15 - 41 U/L   ALT 8 0 - 44 U/L   Alkaline Phosphatase 56 38 - 126 U/L   Total Bilirubin 1.4 (H) 0.0 - 1.2 mg/dL   GFR, Estimated 56 (L) >60 mL/min   Anion gap 8 5 - 15  CBC with Differential     Status: Abnormal   Collection Time: 01/11/24  2:58 PM  Result Value  Ref Range   WBC 18.3 (H) 4.0 - 10.5 K/uL   RBC 3.76 (L) 4.22 - 5.81 MIL/uL   Hemoglobin 8.0 (L) 13.0 - 17.0 g/dL   HCT 69.6 (L) 29.5 - 28.4 %   MCV 76.1 (L) 80.0 - 100.0 fL   MCH 21.3 (L) 26.0 - 34.0 pg   MCHC 28.0 (L) 30.0 - 36.0 g/dL   RDW 13.2 (H)  11.5 - 15.5 %   Platelets 393 150 - 400 K/uL   nRBC 0.0 0.0 - 0.2 %   Neutrophils Relative % 88 %   Neutro Abs 16.1 (H) 1.7 - 7.7 K/uL   Lymphocytes Relative 6 %   Lymphs Abs 1.1 0.7 - 4.0 K/uL   Monocytes Relative 5 %   Monocytes Absolute 0.9 0.1 - 1.0 K/uL   Eosinophils Relative 0 %   Eosinophils Absolute 0.0 0.0 - 0.5 K/uL   Basophils Relative 0 %   Basophils Absolute 0.0 0.0 - 0.1 K/uL   Immature Granulocytes 1 %   Abs Immature Granulocytes 0.12 (H) 0.00 - 0.07 K/uL   Ovalocytes PRESENT   Protime-INR     Status: Abnormal   Collection Time: 01/11/24  2:58 PM  Result Value Ref Range   Prothrombin Time 25.0 (H) 11.4 - 15.2 seconds   INR 2.2 (H) 0.8 - 1.2  Lactic acid, plasma     Status: None   Collection Time: 01/11/24  6:01 PM  Result Value Ref Range   Lactic Acid, Venous 0.8 0.5 - 1.9 mmol/L  Culture, blood (single)     Status: None (Preliminary result)   Collection Time: 01/11/24  7:57 PM   Specimen: BLOOD  Result Value Ref Range   Specimen Description      BLOOD BLOOD RIGHT ARM Performed at [facility], [address], [place], [place] [address]    Special Requests      BOTTLES DRAWN AEROBIC AND ANAEROBIC Blood Culture adequate volume Performed at [name], [address], [place], [place] [address]    Culture      NO GROWTH < 12 HOURS Performed at [name], [address], [place], [place] [address]    Report Status PENDING   Procalcitonin     Status: None   Collection Time: 01/11/24  7:57 PM  Result Value Ref Range   Procalcitonin 0.94 ng/mL  CBC with Differential/Platelet     Status: Abnormal   Collection Time: 01/11/24  7:57 PM  Result Value Ref Range    WBC 14.7 (H) 4.0 - 10.5 K/uL   RBC 3.08 (L) 4.22 - 5.81 MIL/uL   Hemoglobin 6.7 (LL) 13.0 - 17.0 g/dL   HCT 91.4 (L) 78.2 - 95.6 %   MCV 76.6 (L) 80.0 - 100.0 fL   MCH 21.8 (L) 26.0 - 34.0 pg   MCHC 28.4 (L) 30.0 - 36.0 g/dL   RDW 21.3 (H) 08.6 - 57.8 %   Platelets 338 150 - 400 K/uL   nRBC 0.0 0.0 - 0.2 %   Neutrophils Relative % 85 %   Neutro Abs 12.4 (H) 1.7 - 7.7 K/uL   Lymphocytes Relative 7 %   Lymphs Abs 1.1 0.7 - 4.0 K/uL   Monocytes Relative 6 %   Monocytes Absolute 0.9 0.1 - 1.0 K/uL   Eosinophils Relative 0 %   Eosinophils Absolute 0.0 0.0 - 0.5 K/uL   Basophils Relative 0 %   Basophils Absolute 0.0 0.0 - 0.1 K/uL   Immature Granulocytes 2 %   Abs Immature Granulocytes 0.31 (H) 0.00 - 0.07 K/uL   Target Cells PRESENT    Ovalocytes PRESENT   Iron and TIBC     Status: Abnormal   Collection Time: 01/11/24  7:57 PM  Result Value Ref Range   Iron 11 (L) 45 - 182 ug/dL   TIBC 469 629 - 528 ug/dL   Saturation Ratios 4 (  L) 17.9 - 39.5 %   UIBC 282 ug/dL  Ferritin     Status: Abnormal   Collection Time: 01/11/24  7:57 PM  Result Value Ref Range   Ferritin 19 (L) 24 - 336 ng/mL  Reticulocytes     Status: Abnormal   Collection Time: 01/11/24  7:57 PM  Result Value Ref Range   Retic Ct Pct 0.8 0.4 - 3.1 %   RBC. 3.09 (L) 4.22 - 5.81 MIL/uL   Retic Count, Absolute 23.5 19.0 - 186.0 K/uL   Immature Retic Fract 25.8 (H) 2.3 - 15.9 %  CK     Status: None   Collection Time: 01/11/24  7:57 PM  Result Value Ref Range   Total CK 87 49 - 397 U/L  Magnesium     Status: None   Collection Time: 01/11/24  7:57 PM  Result Value Ref Range   Magnesium 2.1 1.7 - 2.4 mg/dL  Phosphorus     Status: None   Collection Time: 01/11/24  7:57 PM  Result Value Ref Range   Phosphorus 2.7 2.5 - 4.6 mg/dL  TSH     Status: None   Collection Time: 01/11/24  7:57 PM  Result Value Ref Range   TSH 1.512 0.350 - 4.500 uIU/mL  Basic metabolic panel with GFR     Status: Abnormal   Collection  Time: 01/11/24  7:57 PM  Result Value Ref Range   Sodium 134 (L) 135 - 145 mmol/L   Potassium 3.6 3.5 - 5.1 mmol/L   Chloride 106 98 - 111 mmol/L   CO2 20 (L) 22 - 32 mmol/L   Glucose, Bld 129 (H) 70 - 99 mg/dL   BUN 17 8 - 23 mg/dL   Creatinine, Ser 9.60 0.61 - 1.24 mg/dL   Calcium 7.7 (L) 8.9 - 10.3 mg/dL   GFR, Estimated >45 >40 mL/min   Anion gap 8 5 - 15  POC occult blood, ED Provider will collect     Status: Abnormal   Collection Time: 01/11/24  8:18 PM  Result Value Ref Range   Fecal Occult Bld POSITIVE (A) NEGATIVE  Prepare RBC (crossmatch)     Status: None   Collection Time: 01/11/24 10:25 PM  Result Value Ref Range   Order Confirmation      ORDER PROCESSED BY BLOOD BANK Performed at [name], [address] [place], [place] [address]   Osmolality     Status: None   Collection Time: 01/11/24 10:25 PM  Result Value Ref Range   Osmolality 295 275 - 295 mOsm/kg  Beta-hydroxybutyric acid     Status: None   Collection Time: 01/11/24 10:25 PM  Result Value Ref Range   Beta-Hydroxybutyric Acid 0.07 0.05 - 0.27 mmol/L  Creatinine, urine, random     Status: None   Collection Time: 01/12/24 12:05 AM  Result Value Ref Range   Creatinine, Urine 90 mg/dL  Osmolality, urine     Status: None   Collection Time: 01/12/24 12:05 AM  Result Value Ref Range   Osmolality, Ur 595 300 - 900 mOsm/kg  Sodium, urine, random     Status: None   Collection Time: 01/12/24 12:05 AM  Result Value Ref Range   Sodium, Ur 140 mmol/L  MRSA Next Gen by PCR, Nasal     Status: Abnormal   Collection Time: 01/12/24 12:06 AM   Specimen: Nasal Mucosa; Nasal Swab  Result Value Ref Range   MRSA by PCR Next Gen DETECTED (A) NOT  DETECTED  Urinalysis, Complete w Microscopic -Urine, Clean Catch     Status: Abnormal   Collection Time: 01/12/24 12:50 AM  Result Value Ref Range   Color, Urine YELLOW YELLOW   APPearance CLEAR CLEAR   Specific Gravity, Urine 1.026 1.005 - 1.030   pH  5.0 5.0 - 8.0   Glucose, UA NEGATIVE NEGATIVE mg/dL   Hgb urine dipstick SMALL (A) NEGATIVE   Bilirubin Urine NEGATIVE NEGATIVE   Ketones, ur NEGATIVE NEGATIVE mg/dL   Protein, ur NEGATIVE NEGATIVE mg/dL   Nitrite NEGATIVE NEGATIVE   Leukocytes,Ua NEGATIVE NEGATIVE   RBC / HPF 0-5 0 - 5 RBC/hpf   WBC, UA 0-5 0 - 5 WBC/hpf   Bacteria, UA RARE (A) NONE SEEN   Squamous Epithelial / HPF 0-5 0 - 5 /HPF  CBC     Status: Abnormal   Collection Time: 01/12/24 12:56 AM  Result Value Ref Range   WBC 11.7 (H) 4.0 - 10.5 K/uL   RBC 3.27 (L) 4.22 - 5.81 MIL/uL   Hemoglobin 7.1 (L) 13.0 - 17.0 g/dL   HCT 30.8 (L) 65.7 - 84.6 %   MCV 77.1 (L) 80.0 - 100.0 fL   MCH 21.7 (L) 26.0 - 34.0 pg   MCHC 28.2 (L) 30.0 - 36.0 g/dL   RDW 96.2 (H) 95.2 - 84.1 %   Platelets 336 150 - 400 K/uL   nRBC 0.0 0.0 - 0.2 %  Comprehensive metabolic panel     Status: Abnormal   Collection Time: 01/12/24 12:56 AM  Result Value Ref Range   Sodium 136 135 - 145 mmol/L   Potassium 3.9 3.5 - 5.1 mmol/L   Chloride 106 98 - 111 mmol/L   CO2 21 (L) 22 - 32 mmol/L   Glucose, Bld 104 (H) 70 - 99 mg/dL   BUN 17 8 - 23 mg/dL   Creatinine, Ser 3.24 0.61 - 1.24 mg/dL   Calcium 8.1 (L) 8.9 - 10.3 mg/dL   Total Protein 7.0 6.5 - 8.1 g/dL   Albumin 3.1 (L) 3.5 - 5.0 g/dL   AST 16 15 - 41 U/L   ALT 7 0 - 44 U/L   Alkaline Phosphatase 51 38 - 126 U/L   Total Bilirubin 0.7 0.0 - 1.2 mg/dL   GFR, Estimated >40 >10 mL/min   Anion gap 9 5 - 15  Magnesium     Status: None   Collection Time: 01/12/24 12:56 AM  Result Value Ref Range   Magnesium 1.7 1.7 - 2.4 mg/dL  Lactic acid, plasma     Status: None   Collection Time: 01/12/24  5:55 AM  Result Value Ref Range   Lactic Acid, Venous 1.5 0.5 - 1.9 mmol/L  Resp panel by RT-PCR (RSV, Flu A&B, Covid) Anterior Nasal Swab     Status: None   Collection Time: 01/12/24  9:53 AM   Specimen: Anterior Nasal Swab  Result Value Ref Range   SARS Coronavirus 2 by RT PCR NEGATIVE NEGATIVE    Influenza A by PCR NEGATIVE NEGATIVE   Influenza B by PCR NEGATIVE NEGATIVE   Resp Syncytial Virus by PCR NEGATIVE NEGATIVE  Vitamin B12     Status: Abnormal   Collection Time: 01/12/24 10:44 AM  Result Value Ref Range   Vitamin B-12 93 (L) 180 - 914 pg/mL  Folate     Status: None   Collection Time: 01/12/24 10:44 AM  Result Value Ref Range   Folate 9.3 >5.9 ng/mL  Protime-INR  Status: Abnormal   Collection Time: 01/12/24 10:44 AM  Result Value Ref Range   Prothrombin Time 24.6 (H) 11.4 - 15.2 seconds   INR 2.2 (H) 0.8 - 1.2  Phosphorus     Status: None   Collection Time: 01/12/24 10:44 AM  Result Value Ref Range   Phosphorus 2.6 2.5 - 4.6 mg/dL  Lactic acid, plasma     Status: Abnormal   Collection Time: 01/12/24 10:44 AM  Result Value Ref Range   Lactic Acid, Venous 2.2 (HH) 0.5 - 1.9 mmol/L    I have reviewed pertinent nursing notes, vitals, labs, and images as necessary. I have ordered labwork to follow up on as indicated.  I have reviewed the last notes from staff over past 24 hours. I have discussed patient's care plan and test results with nursing staff, CM/SW, and other staff as appropriate.  Time spent: Greater than 50% of the 55 minute visit was spent in counseling/coordination of care for the patient as laid out in the A&P.   LOS: 1 day   [facility], Alexander [name] [facility] 01/12/2024, 1:53 PM

## 2024-01-12 NOTE — Progress Notes (Signed)
   01/11/24 2257  Assess: MEWS Score  Temp (!) 102.8 F (39.3 C)  BP (!) 141/88  MAP (mmHg) 103  Pulse Rate (!) 114  Resp 20  SpO2 94 %  O2 Device Room Air  Assess: MEWS Score  MEWS Temp 2  MEWS Systolic 0  MEWS Pulse 2  MEWS RR 0  MEWS LOC 0  MEWS Score 4  MEWS Score Color Red  Assess: if the MEWS score is Yellow or Red  Were vital signs accurate and taken at a resting state? Yes  Does the patient meet 2 or more of the SIRS criteria? Yes  Does the patient have a confirmed or suspected source of infection? Yes  MEWS guidelines implemented  Yes, red  Treat  MEWS Interventions Considered administering scheduled or prn medications/treatments as ordered  Take Vital Signs  Increase Vital Sign Frequency  Red: Q1hr x2, continue Q4hrs until patient remains green for 12hrs  Escalate  MEWS: Escalate Red: Discuss with charge nurse and notify provider. Consider notifying RRT. If remains red for 2 hours consider need for higher level of care  Notify: Charge Nurse/RN  Name of Charge Nurse/RN Notified [place], RN  Provider Notification  Provider Name/Title Denece Finger, NP  Date Provider Notified 01/11/24  Time Provider Notified 2301  Method of Notification Page  Notification Reason Change in status;Other (Comment) (RED MEWS)  Provider response At bedside;See new orders  Date of Provider Response 01/11/24  Time of Provider Response 2311  Notify: Rapid Response  Name of Rapid Response RN Notified Genell, RN  Date Rapid Response Notified 01/11/24  Time Rapid Response Notified 2307  Assess: SIRS CRITERIA  SIRS Temperature  1  SIRS Respirations  0  SIRS Pulse 1  SIRS WBC 1  SIRS Score Sum  3

## 2024-01-12 NOTE — Progress Notes (Signed)
   01/12/24 0839  Vitals  Temp (!) 102.5 F (39.2 C)  Temp Source Oral  BP 125/66  MAP (mmHg) 84  BP Location Left Arm  BP Method Automatic  Patient Position (if appropriate) Sitting  Pulse Rate (!) 132  Pulse Rate Source Monitor  Resp (!) 26  MEWS COLOR  MEWS Score Color Red  Oxygen Therapy  SpO2 100 %  O2 Device Nasal Cannula  O2 Flow Rate (L/min) 2 L/min  MEWS Score  MEWS Temp 2  MEWS Systolic 0  MEWS Pulse 3  MEWS RR 2  MEWS LOC 0  MEWS Score 7   First unit of blood infusing, patient with 102.5 temp. MD Girguis made aware- stated to continue blood infusion and give PO PRN Tylenol. CN Camilo Cella made aware as well as RR Charity fundraiser. Patient A&O, sitting on EOB to urinate. No signs of distress. Will continue to transfuse as ordered and monitor closely.

## 2024-01-12 NOTE — Progress Notes (Signed)
 Date and time results received: 01/12/24 1349  Test: Lactic Acid Critical Value: 2.2  Name of Provider Notified: Dr. Gladstone Lamer notified at 1354  Orders Received? Or Actions Taken?: LR infusing, order renewed and rate changed. Tylenol changed to q4h PRN
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
## 2024-01-12 NOTE — Assessment & Plan Note (Addendum)
 01-25-2024 was on coumadin  at home. Currently on IV heparin .

## 2024-01-12 NOTE — Assessment & Plan Note (Addendum)
 01-11-2024 through 01-25-2024  Folate normal, B12 severely low (93) -Ferritin low and iron  labs also low.  Venofer  repletion ordered x 3 days - Also repleting B12  01-26-2024 stable.  01-27-2024 stable.  01-28-2024 stable

## 2024-01-12 NOTE — Assessment & Plan Note (Addendum)
 01-25-2024 off IV vasopressors. Prn IV labetalol   01-26-2024 stable.  01-27-2024 stable.  01-28-2024 stable

## 2024-01-12 NOTE — Progress Notes (Signed)
 Temp remains 102.8 oral. 650 mg Tylenol given at 1440. Patient's room temp turned low, ice packs applied under patient's armpits/neck/groin. Only a sheet on patient. Will continue to recheck.

## 2024-01-12 NOTE — Hospital Course (Addendum)
 PMH of PE on Coumadin , HFpEF presented to the hospital with BRBPR.  Was found to have colon perforation.  GI, general surgery were consulted.  Underwent ex lap followed by ICU stay.  Developed AKI requiring CRRT. Now out of the ICU but remains volume overloaded and getting HD.  Significant event. /12 admitted for GI bleed and BRBPR.  GI consulted conservative measures.  EGD colonoscopy was recommended. 4/15 repeat CT scan shows evidence of concern for contained perforation in the ascending colon.  General surgery consulted.  Recommending ex lap. 4/16 transferred to ICU. 4/16 cardiorespiratory arrest, achieved ROSC. 4/17 back to the OR, wound VAC placement and small bowel resection. 4/19 Back to the OR s/p exploratory laparotomy, ileostomy creation, small bowel resection, wound VAC in place. nephrology consulted for CRRT 4/23 extubated; off pressors 4/25 Transferred from [facility] ICU to [facility] progressive due to need for hemodialysis. 4/29 triple-lumen CVC placement for TPN. 4/30 CT-guided drain placement.  Dark bloody output.  Anticoagulation on hold.  Also HD catheter placement on right with plan for inpatient HD.  Assessment and Plan: Spontaneous perforation of colon SP right colectomy. Underwent 3 ex lap as above, SP right hemicolectomy and small bowel resection as well as ileostomy creation. Has a wound VAC. Currently has IV TPN. Management per general surgery. 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 Current plan is conservative management with a drain and a controlled fistula. Bowel rest recommended. NG recommended-patient refused.  Remains NPO.  Few ice chips okay better   AKI  Initially required CRRT. Suspect combination of shock with ATN. Possibility of IV contrast injury also cannot be ruled out. Continues to third space.  Reports of arthralgia. Urine output is  actually improving.  Nephrology recommend to hold off on HD. Currently has a temp HD catheter.   Acute blood loss anemia as well as anemia of critical illness Iron  deficiency anemia.  B12 deficiency. Presented with BRBPR.  Hemoglobin at baseline around 10.  Hemoglobin on admission was 7.6.  Hemoglobin at its lowest point was 4.0 on 4/17. Currently hemoglobin is stable around 7 Transfuse hemoglobin less than 7. Currently holding heparin . Last PRBC transfusion was on 4/30. Folate normal, B12 low.  Iron  repleted.  B12 repleted.  Monitor.  ENTEROCOCCUS FAECIUM infection. Drain culture is growing E faecium.  Sensitivity currently pending.  Given clinical improvement for now I will continue with IV Zosyn  although pending sensitivity or if decompensates again may require vancomycin.  Multifactorial shock. Patient has needed vasopressors throughout the hospital stay multiple times. After aggressive IV hydration as well as IV albumin  blood pressure has improved. Monitor.  Septic shock.  Present on admission. Sepsis physiology appears to have resolved for now.  Hypotension. Developed another episode of hypotension on 4/29. Most likely in the setting of pain medication. Zosyn  have been resumed. Improving leukocytosis. Will monitor for improvement. IV albumin  given.  History of PE. 07/2023. Presented with BRBPR.  Anticoagulation has been on hold.  Was resumed with heparin . Given anemia seen again heparin  is on hold. Especially in the setting of drain output being bloody. Remains at risk for recurrent PE.   Acute hypoxic respiratory failure Requiring intubation. Was extubated on 4/23.  Intermittently on oxygen.  Continue incentive spirometry.   Diverticulitis Treated before.  Agitation. Likely hospital induced delirium. Monitor.  As needed Ativan .  Hypoglycemia. As the patient was not able to get his TPN  his blood sugars were low. Treated with D10.  Now better.  Acute right knee  pain. Concern for gout. Reports pins-and-needles like pain in his right knee. Suspect arthralgia in the setting of need for HD. X-ray unremarkable. Lower extremity venous Doppler ordered. Now appears with swelling and warmth.  No significant redness so far.  Uric acid is normal.  CRP ESR elevated although it could be multifactorial.  Not a candidate for IV steroids, IV Toradol, other oral NSAIDs or colchicine .  Continue pain control for now.  Hiccups. As needed Thorazine .

## 2024-01-12 NOTE — Assessment & Plan Note (Signed)
-   Fever, tachycardia, tachypnea, leukocytosis.  Presumed abdominal infection with suspected diverticulitis - Continue Zosyn - stable now over past 24 hrs after tx to ICU

## 2024-01-12 NOTE — Assessment & Plan Note (Addendum)
 01-25-2024 pt has completed IV abx.

## 2024-01-12 NOTE — Assessment & Plan Note (Addendum)
 01-25-2024 likely caused by a combination of acute blood loss anemia, hypotension due to cardiac arrest. Needed CRRT from 01-18-2024 through 01-24-2024. Pt transferred from [facility] ICU over to [facility] progressive bed in order to receive continued HD. Nephrology following. Hopeful for renal recovery.  01-26-2024 pt for HD today. Defer to nephrology when to get his right femoral HD catheter out and have permcath placed.  01-27-2024 nephrology holding off HD today. Had HD yesterday.  Unclear what exactly his urine output has been as it was documented he had 140 liters in output yesterday. He still has right femoral HD catheter. Will leave him on IV heparin  until it is decided what kind of HD catheter he is going to need.  01-28-2024 awaiting AM labs. Had 1650 in urine output yesterday. Renal recovery seems like it is progressing. Nephrology following.

## 2024-01-12 NOTE — Progress Notes (Addendum)
       Overnight   NAME: Alexander [name] MRN: [id number] DOB : 1961/09/06    Date of Service   01/12/2024   HPI/Events of Note    Notified by RN for temp and delay of transfusion due to Temp.  No additional blood seen by staff  Cooling measures to be initiated. Tylenol given  May require IV Tylenol due to NPO and BRBpRectum previously. Tachypnea is present. BP is maintained    Interventions/ Plan   Change Lab draws to STAT  Cooling measures Tylenol IV if temp persists May require SDU and additional O2/ Cooling measures. Continue with Blood transfusion(s) as indicated  Continue Admitting Physician orders      Update 0236 Hrs.  Temperature resolution  at 0233 Hrs  Now 98.5 F after cooling and IV Tylenol Continue all previous Attending orders. Stay in place for now.    Denece Finger BSN MSNA MSN ACNPC-AG Acute Care Nurse Practitioner [facility]

## 2024-01-13 ENCOUNTER — Telehealth: Payer: Self-pay

## 2024-01-13 DIAGNOSIS — K921 Melena: Secondary | ICD-10-CM | POA: Diagnosis not present

## 2024-01-13 DIAGNOSIS — R1031 Right lower quadrant pain: Secondary | ICD-10-CM

## 2024-01-13 DIAGNOSIS — A419 Sepsis, unspecified organism: Secondary | ICD-10-CM | POA: Diagnosis not present

## 2024-01-13 DIAGNOSIS — D62 Acute posthemorrhagic anemia: Secondary | ICD-10-CM | POA: Diagnosis not present

## 2024-01-13 DIAGNOSIS — K5792 Diverticulitis of intestine, part unspecified, without perforation or abscess without bleeding: Secondary | ICD-10-CM | POA: Diagnosis not present

## 2024-01-13 DIAGNOSIS — K5730 Diverticulosis of large intestine without perforation or abscess without bleeding: Secondary | ICD-10-CM

## 2024-01-13 DIAGNOSIS — N179 Acute kidney failure, unspecified: Secondary | ICD-10-CM | POA: Diagnosis not present

## 2024-01-13 DIAGNOSIS — D509 Iron deficiency anemia, unspecified: Secondary | ICD-10-CM | POA: Diagnosis not present

## 2024-01-13 DIAGNOSIS — R6520 Severe sepsis without septic shock: Secondary | ICD-10-CM

## 2024-01-13 LAB — BASIC METABOLIC PANEL WITH GFR
Anion gap: 16 — ABNORMAL HIGH (ref 5–15)
BUN: 26 mg/dL — ABNORMAL HIGH (ref 8–23)
CO2: 15 mmol/L — ABNORMAL LOW (ref 22–32)
Calcium: 7.6 mg/dL — ABNORMAL LOW (ref 8.9–10.3)
Chloride: 105 mmol/L (ref 98–111)
Creatinine, Ser: 1.92 mg/dL — ABNORMAL HIGH (ref 0.61–1.24)
GFR, Estimated: 39 mL/min — ABNORMAL LOW (ref >60)
Glucose, Bld: 171 mg/dL — ABNORMAL HIGH (ref 70–99)
Potassium: 4.1 mmol/L (ref 3.5–5.1)
Sodium: 136 mmol/L (ref 135–145)

## 2024-01-13 LAB — CBC
HCT: 19.8 % — ABNORMAL LOW (ref 39.0–52.0)
Hemoglobin: 6.1 g/dL — CL (ref 13.0–17.0)
MCH: 24.3 pg — ABNORMAL LOW (ref 26.0–34.0)
MCHC: 30.8 g/dL (ref 30.0–36.0)
MCV: 78.9 fL — ABNORMAL LOW (ref 80.0–100.0)
Platelets: 220 10*3/uL (ref 150–400)
RBC: 2.51 MIL/uL — ABNORMAL LOW (ref 4.22–5.81)
RDW: 19.6 % — ABNORMAL HIGH (ref 11.5–15.5)
WBC: 15.1 10*3/uL — ABNORMAL HIGH (ref 4.0–10.5)
nRBC: 0 % (ref 0.0–0.2)

## 2024-01-13 LAB — CBC WITH DIFFERENTIAL/PLATELET
Abs Immature Granulocytes: 0.22 10*3/uL — ABNORMAL HIGH (ref 0.00–0.07)
Basophils Absolute: 0 10*3/uL (ref 0.0–0.1)
Basophils Relative: 0 %
Eosinophils Absolute: 0 10*3/uL (ref 0.0–0.5)
Eosinophils Relative: 0 %
HCT: 21.7 % — ABNORMAL LOW (ref 39.0–52.0)
Hemoglobin: 6.5 g/dL — CL (ref 13.0–17.0)
Immature Granulocytes: 1 %
Lymphocytes Relative: 3 %
Lymphs Abs: 0.6 10*3/uL — ABNORMAL LOW (ref 0.7–4.0)
MCH: 24.1 pg — ABNORMAL LOW (ref 26.0–34.0)
MCHC: 30 g/dL (ref 30.0–36.0)
MCV: 80.4 fL (ref 80.0–100.0)
Monocytes Absolute: 1 10*3/uL (ref 0.1–1.0)
Monocytes Relative: 5 %
Neutro Abs: 19.7 10*3/uL — ABNORMAL HIGH (ref 1.7–7.7)
Neutrophils Relative %: 91 %
Platelets: 239 10*3/uL (ref 150–400)
RBC: 2.7 MIL/uL — ABNORMAL LOW (ref 4.22–5.81)
RDW: 21.3 % — ABNORMAL HIGH (ref 11.5–15.5)
WBC: 21.5 10*3/uL — ABNORMAL HIGH (ref 4.0–10.5)
nRBC: 0.1 % (ref 0.0–0.2)

## 2024-01-13 LAB — PREPARE RBC (CROSSMATCH)

## 2024-01-13 LAB — LACTIC ACID, PLASMA
Lactic Acid, Venous: 8.3 mmol/L (ref 0.5–1.9)
Lactic Acid, Venous: 2.7 mmol/L (ref 0.5–1.9)
Lactic Acid, Venous: 1.2 mmol/L (ref 0.5–1.9)

## 2024-01-13 LAB — MAGNESIUM: Magnesium: 2 mg/dL (ref 1.7–2.4)

## 2024-01-13 LAB — GLUCOSE, CAPILLARY: Glucose-Capillary: 148 mg/dL — ABNORMAL HIGH (ref 70–99)

## 2024-01-13 MED ORDER — TRAZODONE HCL 50 MG PO TABS
25.0000 mg | ORAL_TABLET | Freq: Once | ORAL | Status: AC
  Administered 2024-01-13: 25 mg via ORAL
  Filled 2024-01-13: qty 1

## 2024-01-13 MED ORDER — SODIUM CHLORIDE 0.9 % IV BOLUS
500.0000 mL | Freq: Once | INTRAVENOUS | Status: AC
  Administered 2024-01-13: 500 mL via INTRAVENOUS

## 2024-01-13 MED ORDER — SODIUM CHLORIDE 0.9% IV SOLUTION: Freq: Once | INTRAVENOUS | Status: AC

## 2024-01-13 MED ORDER — MEDIHONEY WOUND/BURN DRESSING EX PSTE
1.0000 | PASTE | Freq: Every day | CUTANEOUS | Status: DC
  Administered 2024-01-13: 1 via TOPICAL
  Filled 2024-01-13: qty 44

## 2024-01-13 MED ORDER — HYDROMORPHONE HCL 1 MG/ML IJ SOLN
0.5000 mg | Freq: Once | INTRAMUSCULAR | Status: AC | PRN
  Administered 2024-01-13: 0.5 mg via INTRAVENOUS
  Filled 2024-01-13: qty 0.5

## 2024-01-13 MED ORDER — HYDROMORPHONE HCL 1 MG/ML IJ SOLN
0.5000 mg | INTRAMUSCULAR | Status: AC | PRN
  Administered 2024-01-13: 0.5 mg via INTRAVENOUS
  Filled 2024-01-13: qty 1

## 2024-01-13 MED ORDER — IRON SUCROSE 300 MG IVPB - SIMPLE MED
300.0000 mg | Freq: Every day | Status: DC
  Filled 2024-01-13: qty 265

## 2024-01-13 MED ORDER — LACTATED RINGERS IV BOLUS
1000.0000 mL | Freq: Once | INTRAVENOUS | Status: AC
  Administered 2024-01-13: 1000 mL via INTRAVENOUS

## 2024-01-13 MED ORDER — IRON SUCROSE 300 MG IVPB - SIMPLE MED
300.0000 mg | Freq: Every day | Status: AC
  Administered 2024-01-13 – 2024-01-14 (×2): 300 mg via INTRAVENOUS
  Filled 2024-01-13 (×2): qty 300

## 2024-01-13 MED ORDER — MUSCLE RUB 10-15 % EX CREA
TOPICAL_CREAM | CUTANEOUS | Status: DC | PRN
  Filled 2024-01-13: qty 85

## 2024-01-13 NOTE — Progress Notes (Addendum)
 [facility] Progress [facility]  CC: Right sided abdominal pain, hematochezia  Subjective: He stated having less abdominal pain today when compared to yesterday.  He passed a bloody bowel movement last night prior to arriving to the ICU without further recurrence.  He is tolerating clear liquids.  Nausea or vomiting.  No chest pain or shortness of breath.  No family at the bedside.   Objective:  Vital signs in last 24 hours: Temp:  [97.4 F (36.3 C)-102.9 F (39.4 C)] 97.7 F (36.5 C) (04/14 1200) Pulse Rate:  [98-118] 98 (04/14 1200) Resp:  [15-32] 18 (04/14 1200) BP: (92-141)/(57-76) 101/63 (04/14 1200) SpO2:  [90 %-100 %] 96 % (04/14 1200) Last BM Date : 01/13/24 General: 63 year old male in no acute distress. Heart: Rate and rhythm, no murmurs. Pulm: Breath sounds clear throughout Abdomen: Soft.  Mild distention with mild generalized tenderness without rebound or guarding.  Positive bowel sounds to all 4 quadrants. Extremities:  No significant edema.  Neurologic:  Alert and  oriented x 4. Grossly normal neurologically. Psych:  Alert and cooperative. Normal mood and affect.  Intake/Output from previous day: 04/13 0701 - 04/14 0700 In: 2850 [P.O.:240; I.V.:1126.1; Blood:782; IV Piggyback:702] Out: 1200 [Urine:1200] Intake/Output this shift: Total I/O In: 1999.8 [I.V.:724.8; Blood:300; IV Piggyback:975] Out: 200 [Urine:200]  Lab Results: Recent Labs    01/12/24 0056 01/12/24 1552 01/13/24 0642  WBC 11.7* 16.1* 21.5*  HGB 7.1* 8.0* 6.5*  HCT 25.2* 26.8* 21.7*  PLT 336 263 239   BMET Recent Labs    01/11/24 1957 01/12/24 0056 01/13/24 0450  NA 134* 136 136  K 3.6 3.9 4.1  CL 106 106 105  CO2 20* 21* 15*  GLUCOSE 129* 104* 171*  BUN 17 17 26*  CREATININE 1.08 1.19 1.92*  CALCIUM 7.7* 8.1* 7.6*   LFT Recent Labs    01/12/24 0056  PROT 7.0  ALBUMIN 3.1*  AST 16  ALT 7  ALKPHOS 51  BILITOT 0.7   PT/INR Recent Labs    01/11/24 1458  01/12/24 1044  LABPROT 25.0* 24.6*  INR 2.2* 2.2*   Hepatitis Panel No results for input(s): "HEPBSAG", "HCVAB", "HEPAIGM", "HEPBIGM" in the last 72 hours.  CT ABDOMEN PELVIS W CONTRAST Result Date: 01/11/2024 CLINICAL DATA:  Acute abdominal pain EXAM: CT ABDOMEN AND PELVIS WITH CONTRAST TECHNIQUE: Multidetector CT imaging of the abdomen and pelvis was performed using the standard protocol following bolus administration of intravenous contrast. RADIATION DOSE REDUCTION: This exam was performed according to the departmental dose-optimization program which includes automated exposure control, adjustment of the mA and/or kV according to patient size and/or use of iterative reconstruction technique. CONTRAST:  OMNIPAQUE IOHEXOL 300 MG/ML  SOLN COMPARISON:  09/28/2019 FINDINGS: Lower chest: No pleural or pericardial effusion. Dependent atelectasis posteriorly in both lower lobes. Hepatobiliary: No focal liver abnormality is seen. No gallstones, gallbladder wall thickening, or biliary dilatation. Pancreas: Unremarkable. No pancreatic ductal dilatation or surrounding inflammatory changes. Spleen: Normal in size without focal abnormality. Adrenals/Urinary Tract: Adrenal glands are unremarkable. Kidneys are normal, without renal calculi, focal lesion, or hydronephrosis. Bladder is unremarkable. Stomach/Bowel: Stomach is decompressed, without acute finding. Scattered gas and fluid filled small bowel loops without dilatation. There is inflammatory process involving the proximal ascending colon including terminal ileum, with eccentric wall thickening. Multiple colonic diverticula in the ascending colon. No definite drainable abscess. More distal colon is partially distended. Scattered diverticula in the distal descending segment without adjacent inflammatory change. Vascular/Lymphatic: Scattered aortoiliac calcified plaque  without aneurysm. Portal vein patent. No retroperitoneal or pelvic adenopathy. Prominent  subcentimeter lymph node in the right colon mesentery. No overt mesenteric adenopathy. Reproductive: Mild prostate enlargement. Other: No ascites.  No free air. Musculoskeletal: Spondylitic changes in the lower lumbar spine. 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 IMPRESSION: 1. Inflammatory process involving the proximal ascending colon including terminal ileum, with eccentric wall thickening. Differential considerations include diverticulitis, colitis, and neoplasm. Recommend follow-up colonoscopy when clinically appropriate. 2. 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 Electronically Signed   By: Nicoletta Barrier M.D.   On: 01/11/2024 17:15    Patient  Profile: Alexander [name] is a 63 y.o. male with a history of HTN, gout, GERD, PAD, PE/DVT 07/2023 (on Coumadin) and CHF admitted 01/11/2024 with acute onset BRBPR x 2 days then maroon colored stool with  right sided abdominal pain.   Assessment / Plan:  63 year old male with right mid abdominal/RLQ pain, hematochezia and severe sepsis. Temp 102.8 on 4/13.  CTAP showed inflammatory process involving the proximal ascending colon and terminal ileum with eccentric wall thickening, multiple diverticula in the ascending colon without abscess and scattered left-sided diverticulosis without adjacent inflammatory change concerning for diverticulitis versus colitis or neoplasm. Lactic acid level 8.3 -> 2.7 -> 1.2. WBC 11.7 -> 16.1 -> 21.5. On Zosyn IV. Blood cultures pending. Abdominal pain decreased. Afebrile today. Hemodynamically stable. -Continue IV Zosyn -Pain management per the hospitalist -Clear liquid diet -Eventual colonoscopy to rule out colon malignancy   Acute on chronic iron deficiency anemia.  Admission Hg 8.0 -> 6.7 -> transfused 2 units of PRBCs 4/13 -> Hg 8.0 -> today Hg 6.5 -> transfused one unit of PRBCs, post transfusion H/H not yet collected.  Iron 11.  Ferritin 19.  He passed on bloody BM last night without further  recurrence today. Never had an EGD or screening colonoscopy.  - IV iron ordered by the hospitalist - Continue to hold anticoagulation -Transfuse for hemoglobin less than 7 and as needed if symptomatic - EGD and colonoscopy when medically appropriate - Await further recommendations per Dr. Brice Campi  AKI,  Cr 1.42 -> 1.08 -> 1.19 -> 1.92 - IV fluids per the medical service  History of PE/DVT 07/2023. Coumadin on hold,  last dose was Friday 4/11 at 4 PM  GERD - Pantoprazole 40 mg p.o. daily  Left femoral head AVN per CT - Management per the medical service     Principal Problem:   Severe sepsis [facility]) Active Problems:   Essential hypertension, benign   Hyperlipidemia   History of pulmonary embolism   Diverticulitis   AKI (acute kidney injury) (HCC)   Iron deficiency anemia   ABLA (acute blood loss anemia)     LOS: 2 days   Tory Freiberg  01/13/2024, 1:31 PM

## 2024-01-13 NOTE — Progress Notes (Addendum)
       Overnight   NAME: Jonathen Rathman MRN: [id number] DOB : 1961/09/13    Date of Service   01/13/2024   HPI/Events of Note    Notified by RN for critical lab value see below:   Latest Reference Range & Units 01/12/24 10:44 01/13/24 04:50  Lactic Acid, Venous 0.5 - 1.9 mmol/L 2.2 (HH) 8.3 (HH)  (HH): Data is critically high   Latest Reference Range & Units 01/13/24 04:50  Sodium 135 - 145 mmol/L 136  Potassium 3.5 - 5.1 mmol/L 4.1  Chloride 98 - 111 mmol/L 105  CO2 22 - 32 mmol/L 15 (L)  Glucose 70 - 99 mg/dL 086 (H)  BUN 8 - 23 mg/dL 26 (H)  Creatinine 5.78 - 1.24 mg/dL 4.69 (H)  Calcium 8.9 - 10.3 mg/dL 7.6 (L)  Anion gap 5 - 15  16 (H)  Magnesium 1.7 - 2.4 mg/dL 2.0  (L): Data is abnormally low (H): Data is abnormally high  Blood cultures ordered Patient will be transferred to stepdown. (Floor cannot accommodate lactic of this level) Patient is on Zosyn currently.    Interventions/ Plan   Transfer to stepdown. LR bolus Blood cultures x 2. Continue all previous attending orders.      Denece Finger BSN MSNA MSN ACNPC-AG Acute Care Nurse Practitioner [facility]

## 2024-01-13 NOTE — Progress Notes (Signed)
 Received patient from AM nurse. Was on yellow mews due to HR. Had some episodes of hyperthermia. Cooling measures done throughout the night. Pt's has been complaining of pain in the abdomen and been restless because of it. Fentanyl IV given every 2 hours and other PO PRN pain meds. According to patient, fentanyl did not help with the pain. Also complained of SOB. O2 increased to 4 lpm. O2 sat has been stable 98-100%. Reached out to On call, J. Sharion Davidson, NP and RRT. Dilaudid IV ordered and given. Will continue to monitor.

## 2024-01-13 NOTE — Progress Notes (Signed)
 1 episode of small bloody BM.

## 2024-01-13 NOTE — Telephone Encounter (Signed)
 Received msg from PCP that pt was hospitalized on 4/12 for hematochezia. Pt is currently inpt. GI consult recommends EGD/colonoscopy. Pt currently febrile and undergoing blood culture testing.

## 2024-01-13 NOTE — TOC Initial Note (Signed)
 Transition of Care [facility]) - Initial/Assessment Note    Patient Details  Name: Alexander [name] MRN: [id number] Date of Birth: 10-Oct-1960  [facility]) CM/SW Contact:    Lanier Clam, RN Phone Number: 01/13/2024, 9:12 AM  Clinical Narrative: From home.Noted WOC-wound care.Continue to monitor.                  Expected Discharge Plan: Home w Home Health Services Barriers to Discharge: Continued Medical Work up   Patient Goals and CMS Choice Patient states their goals for this hospitalization and ongoing recovery are:: Home CMS Medicare.gov Compare Post Acute Care list provided to:: Patient Choice offered to / list presented to : Patient [place] ownership interest in [facility].provided to:: Patient    Expected Discharge Plan and Services                                              Prior Living Arrangements/Services                       Activities of Daily Living   ADL Screening (condition at time of admission) Independently performs ADLs?: Yes (appropriate for developmental age) Is the patient deaf or have difficulty hearing?: No Does the patient have difficulty seeing, even when wearing glasses/contacts?: No Does the patient have difficulty concentrating, remembering, or making decisions?: No  Permission Sought/Granted                  Emotional Assessment              Admission diagnosis:  Hematochezia [K92.1] Diverticulitis [K57.92] Rectal bleeding [K62.5] Sepsis (HCC) [A41.9] Patient Active Problem List   Diagnosis Date Noted   Iron deficiency anemia 01/12/2024   Diverticulitis of colon 01/12/2024   Sepsis (HCC) 01/11/2024   History of pulmonary embolism 01/11/2024   Diverticulitis 01/11/2024   Symptomatic anemia 01/11/2024   AKI (acute kidney injury) (HCC) 01/11/2024   Lower GI bleed 01/11/2024   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   Thrombocytopenia (HCC) 07/13/2023   Pulmonary  hypertension (HCC) 07/12/2023   Cor pulmonale (HCC) 07/12/2023   Noncompliance 01/22/2020   Impaired fasting blood sugar 01/22/2020   Chronic diarrhea 01/22/2020   High risk medication use 10/30/2019   S/P surgical manipulation of ankle joint 10/30/2019   Hyperlipidemia 10/30/2019   Hepatic steatosis 10/08/2019   Spinal stenosis 10/08/2019   Aortic atherosclerosis (HCC) 10/08/2019   Peripheral arterial disease (HCC) 10/08/2019   Sleep disturbance 10/08/2019   Diverticulosis 10/08/2019   Pulmonary embolus (HCC) 09/29/2019   Acute pulmonary embolism (HCC) 09/28/2019   DVT (deep venous thrombosis) (HCC) 09/28/2019   Essential hypertension, benign 09/28/2019   Gout 09/28/2019   Chronic pain of right ankle 12/12/2016   Chronic foot pain, right 11/02/2012   Elevated uric acid in blood 09/21/2011   Chronic GERD 09/21/2011   PCP:  Jarold Motto, PA Pharmacy:   [facility] [id number] [place], Veteran - [address] [place] [address] Phone: [phone] Fax: [phone]     Social Drivers of Health (SDOH) Social History: SDOH Screenings   Food Insecurity: No Food Insecurity (01/12/2024)  Housing: Low Risk  (01/12/2024)  Transportation Needs: No Transportation Needs (01/12/2024)  Utilities: Not At Risk (01/12/2024)  Depression (PHQ2-9): Low Risk  (07/24/2023)  Tobacco Use: Medium Risk (01/11/2024)  SDOH Interventions:     Readmission Risk Interventions    07/11/2023    2:11 PM  Readmission Risk Prevention Plan  Post Dischage Appt Complete  Medication Screening Complete  Transportation Screening Complete

## 2024-01-13 NOTE — Consult Note (Signed)
 WOC Nurse Consult Note: Reason for Consult: multiple wounds  Wound type: 1. Full thickness lower legs unknown etiology  2.  Full thickness vertebral column  Pressure Injury POA: no, not felt to be due to pressure  Measurement: see nursing flowsheet  Wound bed: 1. B legs with multiple scattered full thickness wounds covered by dry necrotic tissue  2.  Vertebral column full thickness pink moist  Drainage (amount, consistency, odor) appear dry  Periwound:dark discoloration to skin of legs with what appear to be scars from previously healed wounds  Dressing procedure/placement/frequency:  Cleanse B lower leg wounds with vashe wound cleanser [employer] Foot [phone]), do not rinse and allow to air dry. Cover wounds with Xeroform gauze [employer] Foot [phone]) and secure with silicone foam or ABD pad and Kerlix roll gauze whichever is preferred.  Cleanse vertebral column wound with Vashe wound cleanser, cover with silicone foam. Change foam q3 days and prn soiling.   POC discussed with bedside nurse. WOC team will not follow. Re-consult if further needs arise.   Thank you,    Ronni Colace MSN, RN-BC, [employer] [phone]

## 2024-01-13 NOTE — Plan of Care (Signed)
 Patient transfer from [address]) due to sepsis protocal, lactic acid of 8.2, plan of care and goals reviewed with patient with time given for questions, skin assessment completed with Devan RN, patient has chicken pock like sores over his entire body, mepilex applied to sacrum and heels up on pillows, IVF infusing as per orders, education completed  Problem: Education: Goal: Knowledge of General Education information will improve Description: Including pain rating scale, medication(s)/side effects and non-pharmacologic comfort measures Outcome: Progressing   Problem: Health Behavior/Discharge Planning: Goal: Ability to manage health-related needs will improve Outcome: Progressing   Problem: Clinical Measurements: Goal: Ability to maintain clinical measurements within normal limits will improve Outcome: Progressing Goal: Will remain free from infection Outcome: Progressing Goal: Diagnostic test results will improve Outcome: Progressing Goal: Respiratory complications will improve Outcome: Progressing Goal: Cardiovascular complication will be avoided Outcome: Progressing   Problem: Activity: Goal: Risk for activity intolerance will decrease Outcome: Progressing   Problem: Nutrition: Goal: Adequate nutrition will be maintained Outcome: Progressing   Problem: Coping: Goal: Level of anxiety will decrease Outcome: Progressing   Problem: Elimination: Goal: Will not experience complications related to bowel motility Outcome: Progressing Goal: Will not experience complications related to urinary retention Outcome: Progressing   Problem: Pain Managment: Goal: General experience of comfort will improve and/or be controlled Outcome: Progressing   Problem: Safety: Goal: Ability to remain free from injury will improve Outcome: Progressing   Problem: Skin Integrity: Goal: Risk for impaired skin integrity will decrease Outcome: Progressing

## 2024-01-13 NOTE — Assessment & Plan Note (Signed)
-   Due to ongoing rectal bleeding from presumed diverticulitis at this time - Hemoglobin has down trended to 6.5 g/dL this morning, previously 8 g/dL yesterday - Transfuse 1 unit and repeat.  Further transfusions as necessary -Currently hemodynamically stable

## 2024-01-13 NOTE — Progress Notes (Signed)
 Progress Note    Ritvik Mczeal   ZOX:[id number]  DOB: 12-Sep-1961  DOA: 01/11/2024     2 PCP: Alexander Iba, PA  Initial CC: abdominal pain  Hospital Course: Mr. Thayer is a 63 yo male with PMH diverticulitis, HTN, PAD, PE/DVT, gout, arthritis who presented with rectal bleeding, abdominal pain, and found to have hypotension. CT abdomen/pelvis showed "inflammatory process" involving proximal ascending colon including terminal ileum with wall thickening.  Findings were concerning for diverticulitis with also other etiologies. Hemoglobin 7.6 g/dL on admission and further down trended to 6.7 g/dL.  He was given 1 unit PRBC. Coumadin was held and he was started on antibiotics.  GI was also consulted on admission.  Interval History:  Transferred to the ICU overnight due to persistent fever, abdominal pain, and overall not improving. He seems to have stabilized and is now afebrile.  Vitals are stable.  He is getting 1 unit of blood.  Still poor appetite and did not take in much clears since yesterday  Assessment and Plan: * Severe sepsis (HCC) - Fever, tachycardia, tachypnea, leukocytosis.  Presumed abdominal infection with suspected diverticulitis - Continue Zosyn - Transferred to the ICU overnight due to persistent fever, increasing lactic acid, and ongoing pain; stable after transfer and receiving fluids, blood and now afebrile - Continue trending lactic  Diverticulitis - Not quite consistent with only diverticulitis as possible underlying neoplasm not excluded from CT -For now, treating as diverticulitis with eventual plan for outpatient colonoscopy.  He has never had one -CT notes that inflammatory process involves the proximal ascending colon including terminal ileum -Continue Zosyn -Continue clear liquids; okay if GI advances - Continue fluids  ABLA (acute blood loss anemia) - Due to ongoing rectal bleeding from presumed diverticulitis at this time - Hemoglobin has down  trended to 6.5 g/dL this morning, previously 8 g/dL yesterday - Transfuse 1 unit and repeat.  Further transfusions as necessary -Currently hemodynamically stable  History of pulmonary embolism - On chronic Coumadin at home -INR therapeutic on admission - Coumadin on hold for now in setting of rectal bleeding and severe anemia  Iron deficiency anemia - Folate normal, B12 severely low (93) -Ferritin low and iron labs also low.  Will try to replete some iron via IV - Also repleting B12  AKI (acute kidney injury) (HCC) - baseline creatinine ~ 1 - patient presents with increase in creat >0.3 mg/dL above baseline or creat increase >1.5x baseline presumed to have occurred within past 7 days PTA - creat 1.42 on admission - presumed pre-renal; continue IVF - Some worsening of creatinine again today, likely in setting of volume/blood loss.  No sustained hypotension (but noted low at 92/67 overnight) but with hypoperfusion as evidenced with high lactic acid, at risk for ATN   Essential hypertension, benign - Mostly normotensive - Will add PRN meds in case needed   Old records reviewed in assessment of this patient  Antimicrobials: Zosyn 01/11/2024 >> current  DVT prophylaxis:  SCDs Start: 01/11/24 1948   Code Status:   Code Status: Full Code  Mobility Assessment (Last 72 Hours)     Mobility Assessment     Row Name 01/12/24 2030 01/12/24 0858 01/11/24 2110       Does patient have an order for bedrest or is patient medically unstable No - Continue assessment No - Continue assessment No - Continue assessment     What is the highest level of mobility based on the progressive mobility assessment? Level 5 (Walks with assist  in room/hall) - Balance while stepping forward/back and can walk in room with assist - Complete Level 3 (Stands with assist) - Balance while standing  and cannot march in place Level 3 (Stands with assist) - Balance while standing  and cannot march in place     Is the  above level different from baseline mobility prior to current illness? Yes - Recommend PT order -- Yes - Recommend PT order              Barriers to discharge: none Disposition Plan:  Home HH orders placed: n/a Status is: Inpt  Objective: Blood pressure 100/61, pulse (!) 102, temperature 97.7 F (36.5 C), resp. rate (!) 24, height 5\' 9"  (1.753 m), weight 80.7 kg, SpO2 92%.  Examination:  Physical Exam Constitutional:      General: He is not in acute distress.    Appearance: Normal appearance.  HENT:     Head: Normocephalic and atraumatic.     Mouth/Throat:     Mouth: Mucous membranes are moist.  Eyes:     Extraocular Movements: Extraocular movements intact.  Cardiovascular:     Rate and Rhythm: Normal rate and regular rhythm.  Pulmonary:     Effort: Pulmonary effort is normal. No respiratory distress.     Breath sounds: Normal breath sounds. No wheezing.  Abdominal:     General: Bowel sounds are normal. There is no distension.     Palpations: Abdomen is soft.     Tenderness: There is abdominal tenderness in the right upper quadrant and epigastric area.  Musculoskeletal:        General: Normal range of motion.     Cervical back: Normal range of motion and neck supple.  Skin:    General: Skin is warm and dry.     Comments: Chronic skin lesions/scars,no acute lesions  Neurological:     General: No focal deficit present.     Mental Status: He is alert.  Psychiatric:        Mood and Affect: Mood normal.        Behavior: Behavior normal.      Consultants:  GI  Procedures:    Data Reviewed: Results for orders placed or performed during the hospital encounter of 01/11/24 (from the past 24 hours)  CBC     Status: Abnormal   Collection Time: 01/12/24  3:52 PM  Result Value Ref Range   WBC 16.1 (H) 4.0 - 10.5 K/uL   RBC 3.42 (L) 4.22 - 5.81 MIL/uL   Hemoglobin 8.0 (L) 13.0 - 17.0 g/dL   HCT 08.6 (L) 57.8 - 46.9 %   MCV 78.4 (L) 80.0 - 100.0 fL   MCH 23.4 (L)  26.0 - 34.0 pg   MCHC 29.9 (L) 30.0 - 36.0 g/dL   RDW 62.9 (H) 52.8 - 41.3 %   Platelets 263 150 - 400 K/uL   nRBC 0.0 0.0 - 0.2 %  Glucose, capillary     Status: Abnormal   Collection Time: 01/13/24  2:04 AM  Result Value Ref Range   Glucose-Capillary 148 (H) 70 - 99 mg/dL  Basic metabolic panel with GFR     Status: Abnormal   Collection Time: 01/13/24  4:50 AM  Result Value Ref Range   Sodium 136 135 - 145 mmol/L   Potassium 4.1 3.5 - 5.1 mmol/L   Chloride 105 98 - 111 mmol/L   CO2 15 (L) 22 - 32 mmol/L   Glucose, Bld 171 (H) 70 - 99 mg/dL  BUN 26 (H) 8 - 23 mg/dL   Creatinine, Ser 7.82 (H) 0.61 - 1.24 mg/dL   Calcium 7.6 (L) 8.9 - 10.3 mg/dL   GFR, Estimated 39 (L) >60 mL/min   Anion gap 16 (H) 5 - 15  Magnesium     Status: None   Collection Time: 01/13/24  4:50 AM  Result Value Ref Range   Magnesium 2.0 1.7 - 2.4 mg/dL  Lactic acid, plasma     Status: Abnormal   Collection Time: 01/13/24  4:50 AM  Result Value Ref Range   Lactic Acid, Venous 8.3 (HH) 0.5 - 1.9 mmol/L  CBC with Differential/Platelet     Status: Abnormal   Collection Time: 01/13/24  6:42 AM  Result Value Ref Range   WBC 21.5 (H) 4.0 - 10.5 K/uL   RBC 2.70 (L) 4.22 - 5.81 MIL/uL   Hemoglobin 6.5 (LL) 13.0 - 17.0 g/dL   HCT 95.6 (L) 21.3 - 08.6 %   MCV 80.4 80.0 - 100.0 fL   MCH 24.1 (L) 26.0 - 34.0 pg   MCHC 30.0 30.0 - 36.0 g/dL   RDW 57.8 (H) 46.9 - 62.9 %   Platelets 239 150 - 400 K/uL   nRBC 0.1 0.0 - 0.2 %   Neutrophils Relative % 91 %   Neutro Abs 19.7 (H) 1.7 - 7.7 K/uL   Lymphocytes Relative 3 %   Lymphs Abs 0.6 (L) 0.7 - 4.0 K/uL   Monocytes Relative 5 %   Monocytes Absolute 1.0 0.1 - 1.0 K/uL   Eosinophils Relative 0 %   Eosinophils Absolute 0.0 0.0 - 0.5 K/uL   Basophils Relative 0 %   Basophils Absolute 0.0 0.0 - 0.1 K/uL   Immature Granulocytes 1 %   Abs Immature Granulocytes 0.22 (H) 0.00 - 0.07 K/uL   Polychromasia PRESENT    Ovalocytes PRESENT   Culture, blood (Routine X 2) w  Reflex to ID Panel     Status: None (Preliminary result)   Collection Time: 01/13/24  7:16 AM   Specimen: BLOOD LEFT HAND  Result Value Ref Range   Specimen Description      BLOOD LEFT HAND Performed at [facility], [address] [place], [place] [address]    Special Requests      BOTTLES DRAWN AEROBIC AND ANAEROBIC Blood Culture results may not be optimal due to an inadequate volume of blood received in culture bottles Performed at [facility], [address], [place], [place] [address]    Culture PENDING    Report Status PENDING   Culture, blood (Routine X 2) w Reflex to ID Panel     Status: None (Preliminary result)   Collection Time: 01/13/24  7:16 AM   Specimen: BLOOD RIGHT ARM  Result Value Ref Range   Specimen Description      BLOOD RIGHT ARM Performed at [facility], [address] [place], [place] [address]    Special Requests      BOTTLES DRAWN AEROBIC AND ANAEROBIC Blood Culture results may not be optimal due to an inadequate volume of blood received in culture bottles Performed at [facility], [address] [place], [place] [address]    Culture PENDING    Report Status PENDING   Prepare RBC (crossmatch)     Status: None   Collection Time: 01/13/24  8:11 AM  Result Value Ref Range   Order Confirmation      ORDER PROCESSED BY BLOOD BANK  Performed at [facility], [address], [place], [place] [address]   Lactic acid, plasma     Status: Abnormal   Collection Time: 01/13/24  8:57 AM  Result Value Ref Range   Lactic Acid, Venous 2.7 (HH) 0.5 - 1.9 mmol/L    I have reviewed pertinent nursing notes, vitals, labs, and images as necessary. I have ordered labwork to follow up on as indicated.  I have reviewed the last notes from staff over past 24 hours. I have discussed patient's care plan and test results with nursing staff, CM/SW, and other staff as appropriate.  Critical care time to  evaluate and treat this patient was 55 minutes.  Independent of separate billable services  This patient is critically ill with the following life-threatening issues requiring my presence at the bedside: Hemodynamic instability requiring titration of medications Oxygenation/ventilation instability requiring frequent modifications of support Cardiac rhythm disturbances requiring evaluation and/or interventions Fluctuations in neurologic function requiring evaluation and/or interventions and/or fluid/volume titration    LOS: 2 days   Faith Homes, MD [facility] 01/13/2024, 11:16 AM

## 2024-01-14 ENCOUNTER — Encounter (HOSPITAL_COMMUNITY): Payer: Self-pay | Admitting: Internal Medicine

## 2024-01-14 ENCOUNTER — Inpatient Hospital Stay (HOSPITAL_COMMUNITY)

## 2024-01-14 DIAGNOSIS — D62 Acute posthemorrhagic anemia: Secondary | ICD-10-CM

## 2024-01-14 DIAGNOSIS — K625 Hemorrhage of anus and rectum: Principal | ICD-10-CM

## 2024-01-14 DIAGNOSIS — K5792 Diverticulitis of intestine, part unspecified, without perforation or abscess without bleeding: Secondary | ICD-10-CM | POA: Diagnosis not present

## 2024-01-14 DIAGNOSIS — Z86711 Personal history of pulmonary embolism: Secondary | ICD-10-CM | POA: Diagnosis not present

## 2024-01-14 DIAGNOSIS — D509 Iron deficiency anemia, unspecified: Secondary | ICD-10-CM | POA: Diagnosis not present

## 2024-01-14 DIAGNOSIS — K921 Melena: Secondary | ICD-10-CM | POA: Diagnosis not present

## 2024-01-14 DIAGNOSIS — R140 Abdominal distension (gaseous): Secondary | ICD-10-CM

## 2024-01-14 DIAGNOSIS — R1031 Right lower quadrant pain: Secondary | ICD-10-CM | POA: Diagnosis not present

## 2024-01-14 DIAGNOSIS — A419 Sepsis, unspecified organism: Secondary | ICD-10-CM | POA: Diagnosis not present

## 2024-01-14 LAB — TYPE AND SCREEN
ABO/RH(D): B POS
Antibody Screen: NEGATIVE
Unit division: 0
Unit division: 0
Unit division: 0
Unit division: 0
Unit division: 0

## 2024-01-14 LAB — CBC WITH DIFFERENTIAL/PLATELET
Abs Immature Granulocytes: 0.15 10*3/uL — ABNORMAL HIGH (ref 0.00–0.07)
Basophils Absolute: 0 10*3/uL (ref 0.0–0.1)
Basophils Relative: 0 %
Eosinophils Absolute: 0.5 10*3/uL (ref 0.0–0.5)
Eosinophils Relative: 4 %
HCT: 24.3 % — ABNORMAL LOW (ref 39.0–52.0)
Hemoglobin: 7.7 g/dL — ABNORMAL LOW (ref 13.0–17.0)
Immature Granulocytes: 1 %
Lymphocytes Relative: 8 %
Lymphs Abs: 1.1 10*3/uL (ref 0.7–4.0)
MCH: 25.4 pg — ABNORMAL LOW (ref 26.0–34.0)
MCHC: 31.7 g/dL (ref 30.0–36.0)
MCV: 80.2 fL (ref 80.0–100.0)
Monocytes Absolute: 0.9 10*3/uL (ref 0.1–1.0)
Monocytes Relative: 6 %
Neutro Abs: 12.1 10*3/uL — ABNORMAL HIGH (ref 1.7–7.7)
Neutrophils Relative %: 81 %
Platelets: 234 10*3/uL (ref 150–400)
RBC: 3.03 MIL/uL — ABNORMAL LOW (ref 4.22–5.81)
RDW: 19.1 % — ABNORMAL HIGH (ref 11.5–15.5)
WBC: 14.8 10*3/uL — ABNORMAL HIGH (ref 4.0–10.5)
nRBC: 0.4 % — ABNORMAL HIGH (ref 0.0–0.2)

## 2024-01-14 LAB — BPAM RBC
Blood Product Expiration Date: 202505132359
Blood Product Expiration Date: 202505132359
Blood Product Expiration Date: 202505132359
Blood Product Expiration Date: 202505132359
Blood Product Unit Number: 202505132359
ISSUE DATE / TIME: 202504131002
ISSUE DATE / TIME: 202504140904
ISSUE DATE / TIME: 202504141842
ISSUE DATE / TIME: 202504142239
PRODUCT CODE: 202504130637
PRODUCT CODE: 202505132359
Unit Type and Rh: 202505132359
Unit Type and Rh: 7300
Unit Type and Rh: 7300
Unit Type and Rh: 7300
Unit Type and Rh: 7300
Unit Type and Rh: 7300
Unit Type and Rh: 7300

## 2024-01-14 LAB — BASIC METABOLIC PANEL WITH GFR
Anion gap: 7 (ref 5–15)
BUN: 24 mg/dL — ABNORMAL HIGH (ref 8–23)
CO2: 23 mmol/L (ref 22–32)
Calcium: 8.1 mg/dL — ABNORMAL LOW (ref 8.9–10.3)
Chloride: 107 mmol/L (ref 98–111)
Creatinine, Ser: 1.09 mg/dL (ref 0.61–1.24)
GFR, Estimated: >60 mL/min (ref >60)
Glucose, Bld: 100 mg/dL — ABNORMAL HIGH (ref 70–99)
Potassium: 3.9 mmol/L (ref 3.5–5.1)
Sodium: 137 mmol/L (ref 135–145)

## 2024-01-14 LAB — HEMOGLOBIN AND HEMATOCRIT, BLOOD
HCT: 28.1 % — ABNORMAL LOW (ref 39.0–52.0)
Hemoglobin: 8.6 g/dL — ABNORMAL LOW (ref 13.0–17.0)

## 2024-01-14 LAB — MAGNESIUM: Magnesium: 2.2 mg/dL (ref 1.7–2.4)

## 2024-01-14 MED ORDER — SIMETHICONE 80 MG PO CHEW
80.0000 mg | CHEWABLE_TABLET | Freq: Four times a day (QID) | ORAL | Status: DC | PRN
  Administered 2024-01-14: 80 mg via ORAL
  Filled 2024-01-14: qty 1

## 2024-01-14 MED ORDER — IOHEXOL 300 MG/ML  SOLN
100.0000 mL | Freq: Once | INTRAMUSCULAR | Status: AC | PRN
  Administered 2024-01-14: 100 mL via INTRAVENOUS

## 2024-01-14 MED ORDER — IOHEXOL 9 MG/ML PO SOLN
500.0000 mL | ORAL | Status: AC
  Administered 2024-01-14 (×2): 500 mL via ORAL

## 2024-01-14 MED ORDER — IOHEXOL 9 MG/ML PO SOLN
ORAL | Status: AC
  Filled 2024-01-14: qty 1000

## 2024-01-14 MED ORDER — SODIUM CHLORIDE (PF) 0.9 % IJ SOLN
INTRAMUSCULAR | Status: AC
  Filled 2024-01-14: qty 50

## 2024-01-14 MED ORDER — ALUM & MAG HYDROXIDE-SIMETH 200-200-20 MG/5ML PO SUSP
30.0000 mL | ORAL | Status: DC | PRN
  Administered 2024-01-14 – 2024-01-15 (×3): 30 mL via ORAL
  Filled 2024-01-14 (×4): qty 30

## 2024-01-14 NOTE — Progress Notes (Signed)
 Progress Note    Alexander [name]   EAV:[id number]  DOB: 1961-09-01  DOA: 01/11/2024     3 PCP: Alexander Iba, PA  Initial CC: abdominal pain  Hospital Course: [name] is a 63 yo male with PMH diverticulitis, HTN, PAD, PE/DVT, gout, arthritis who presented with rectal bleeding, abdominal pain, and found to have hypotension. CT abdomen/pelvis showed "inflammatory process" involving proximal ascending colon including terminal ileum with wall thickening.  Findings were concerning for diverticulitis with also other etiologies. Hemoglobin 7.6 g/dL on admission and further down trended to 6.7 g/dL.  He was given 1 unit PRBC. Coumadin was held and he was started on antibiotics.  GI was also consulted on admission.  Interval History:  Seems to be a little bit more stable today.  Subjectively he is feeling better and tolerating clear liquids a little better.  No reported bleeding overnight.  Still has abdominal pain and does appear to be more distended today.  GI also considering inpatient colonoscopy if persistent bleeding and therefore general surgery being consulted today as well given high risk for complications.  Assessment and Plan: * Severe sepsis (HCC)-resolved as of 01/14/2024 - Fever, tachycardia, tachypnea, leukocytosis.  Presumed abdominal infection with suspected diverticulitis - Continue Zosyn - stable now over past 24 hrs after tx to ICU  Diverticulitis - Not quite consistent with only diverticulitis as possible underlying neoplasm not excluded from CT -For now, treating as diverticulitis with eventual plan for outpatient colonoscopy (might need inpatient scope however).  He has never had one -CT notes that inflammatory process involves the proximal ascending colon including terminal ileum -Continue Zosyn -Continue clear liquids; okay if GI advances - Continue fluids - GI concerned patient may need inpatient colonoscopy which would be high risk in current context and  therefore general surgery evaluation also being requested in case of need for further intervention - Patient received multiple transfusions on 01/13/2024; abdomen more distended today but subjectively feeling a little better; repeating CT at this time  ABLA (acute blood loss anemia) - Due to ongoing rectal bleeding from presumed diverticulitis at this time - Has now required 5 units PRBCs since admission -Hgb 7.7 g/dL this morning -Will continue trending and transfuse as necessary  History of pulmonary embolism - On chronic Coumadin at home -INR therapeutic on admission - Coumadin on hold for now in setting of rectal bleeding and severe anemia  Iron deficiency anemia - Folate normal, B12 severely low (93) -Ferritin low and iron labs also low.  Venofer repletion ordered x 3 days - Also repleting B12  AKI (acute kidney injury) (HCC) - baseline creatinine ~ 1 - patient presents with increase in creat >0.3 mg/dL above baseline or creat increase >1.5x baseline presumed to have occurred within past 7 days PTA - creat 1.42 on admission - presumed pre-renal; continue IVF - Some worsening of creatinine again today, likely in setting of volume/blood loss.  No sustained hypotension (but noted low at 92/67 overnight) but with hypoperfusion as evidenced with high lactic acid, at risk for ATN - Creatinine normalized this morning   Essential hypertension, benign - Mostly normotensive - Will add PRN meds in case needed   Old records reviewed in assessment of this patient  Antimicrobials: Zosyn 01/11/2024 >> current  DVT prophylaxis:  SCDs Start: 01/11/24 1948   Code Status:   Code Status: Full Code  Mobility Assessment (Last 72 Hours)     Mobility Assessment     Row Name 01/14/24 0753 01/12/24 2030 01/12/24 7829  01/11/24 2110     Does patient have an order for bedrest or is patient medically unstable No - Continue assessment No - Continue assessment No - Continue assessment No -  Continue assessment    What is the highest level of mobility based on the progressive mobility assessment? Level 5 (Walks with assist in room/hall) - Balance while stepping forward/back and can walk in room with assist - Complete Level 5 (Walks with assist in room/hall) - Balance while stepping forward/back and can walk in room with assist - Complete Level 3 (Stands with assist) - Balance while standing  and cannot march in place Level 3 (Stands with assist) - Balance while standing  and cannot march in place    Is the above level different from baseline mobility prior to current illness? -- Yes - Recommend PT order -- Yes - Recommend PT order             Barriers to discharge: none Disposition Plan:  Home HH orders placed: n/a Status is: Inpt  Objective: Blood pressure 130/83, pulse 99, temperature 99.2 F (37.3 C), temperature source Oral, resp. rate 15, height 5\' 9"  (1.753 m), weight 80.7 kg, SpO2 99%.  Examination:  Physical Exam Constitutional:      General: He is not in acute distress.    Appearance: Normal appearance.  HENT:     Head: Normocephalic and atraumatic.     Mouth/Throat:     Mouth: Mucous membranes are moist.  Eyes:     Extraocular Movements: Extraocular movements intact.  Cardiovascular:     Rate and Rhythm: Normal rate and regular rhythm.  Pulmonary:     Effort: Pulmonary effort is normal. No respiratory distress.     Breath sounds: Normal breath sounds. No wheezing.  Abdominal:     General: Bowel sounds are normal. There is distension.     Palpations: Abdomen is soft.     Tenderness: There is abdominal tenderness in the right upper quadrant and epigastric area.  Musculoskeletal:        General: Normal range of motion.     Cervical back: Normal range of motion and neck supple.  Skin:    General: Skin is warm and dry.     Comments: Chronic skin lesions/scars,no acute lesions  Neurological:     General: No focal deficit present.     Mental Status: He is  alert.  Psychiatric:        Mood and Affect: Mood normal.        Behavior: Behavior normal.      Consultants:  GI General Surgery  Procedures:    Data Reviewed: Results for orders placed or performed during the hospital encounter of 01/11/24 (from the past 24 hours)  Lactic acid, plasma     Status: None   Collection Time: 01/13/24 12:34 PM  Result Value Ref Range   Lactic Acid, Venous 1.2 0.5 - 1.9 mmol/L  CBC     Status: Abnormal   Collection Time: 01/13/24  4:24 PM  Result Value Ref Range   WBC 15.1 (H) 4.0 - 10.5 K/uL   RBC 2.51 (L) 4.22 - 5.81 MIL/uL   Hemoglobin 6.1 (LL) 13.0 - 17.0 g/dL   HCT 65.7 (L) 84.6 - 96.2 %   MCV 78.9 (L) 80.0 - 100.0 fL   MCH 24.3 (L) 26.0 - 34.0 pg   MCHC 30.8 30.0 - 36.0 g/dL   RDW 95.2 (H) 84.1 - 32.4 %   Platelets 220 150 - 400  K/uL   nRBC 0.0 0.0 - 0.2 %  Prepare RBC (crossmatch)     Status: None   Collection Time: 01/13/24  6:10 PM  Result Value Ref Range   Order Confirmation      ORDER PROCESSED BY BLOOD BANK Performed at [facility], [address], [place], [place] [address]   Basic metabolic panel with GFR     Status: Abnormal   Collection Time: 01/14/24  5:57 AM  Result Value Ref Range   Sodium 137 135 - 145 mmol/L   Potassium 3.9 3.5 - 5.1 mmol/L   Chloride 107 98 - 111 mmol/L   CO2 23 22 - 32 mmol/L   Glucose, Bld 100 (H) 70 - 99 mg/dL   BUN 24 (H) 8 - 23 mg/dL   Creatinine, Ser 6.21 0.61 - 1.24 mg/dL   Calcium 8.1 (L) 8.9 - 10.3 mg/dL   GFR, Estimated >30 >86 mL/min   Anion gap 7 5 - 15  CBC with Differential/Platelet     Status: Abnormal   Collection Time: 01/14/24  5:57 AM  Result Value Ref Range   WBC 14.8 (H) 4.0 - 10.5 K/uL   RBC 3.03 (L) 4.22 - 5.81 MIL/uL   Hemoglobin 7.7 (L) 13.0 - 17.0 g/dL   HCT 57.8 (L) 46.9 - 62.9 %   MCV 80.2 80.0 - 100.0 fL   MCH 25.4 (L) 26.0 - 34.0 pg   MCHC 31.7 30.0 - 36.0 g/dL   RDW 52.8 (H) 41.3 - 24.4 %   Platelets 234 150 - 400 K/uL   nRBC 0.4 (H)  0.0 - 0.2 %   Neutrophils Relative % 81 %   Neutro Abs 12.1 (H) 1.7 - 7.7 K/uL   Lymphocytes Relative 8 %   Lymphs Abs 1.1 0.7 - 4.0 K/uL   Monocytes Relative 6 %   Monocytes Absolute 0.9 0.1 - 1.0 K/uL   Eosinophils Relative 4 %   Eosinophils Absolute 0.5 0.0 - 0.5 K/uL   Basophils Relative 0 %   Basophils Absolute 0.0 0.0 - 0.1 K/uL   Immature Granulocytes 1 %   Abs Immature Granulocytes 0.15 (H) 0.00 - 0.07 K/uL  Magnesium     Status: None   Collection Time: 01/14/24  5:57 AM  Result Value Ref Range   Magnesium 2.2 1.7 - 2.4 mg/dL    I have reviewed pertinent nursing notes, vitals, labs, and images as necessary. I have ordered labwork to follow up on as indicated.  I have reviewed the last notes from staff over past 24 hours. I have discussed patient's care plan and test results with nursing staff, CM/SW, and other staff as appropriate.  Time spent: Greater than 50% of the 55 minute visit was spent in counseling/coordination of care for the patient as laid out in the A&P.    LOS: 3 days   Faith Homes, MD [facility] 01/14/2024, 11:57 AM

## 2024-01-14 NOTE — Progress Notes (Signed)
 [facility] Progress Note  CC:  Right sided abdominal pain, hematochezia   Subjective: He is tired, he did not sleep very well last night.  He stated his right sided abdominal pain is about the same as it was yesterday, not any worse.  No BM or hematochezia overnight or thus far this morning.  He denies having any chest pain or shortness of breath.  No family at the bedside.   Objective:  Vital signs in last 24 hours: Temp:  [97.7 F (36.5 C)-99.1 F (37.3 C)] 99.1 F (37.3 C) (04/15 0400) Pulse Rate:  [92-107] 99 (04/15 0400) Resp:  [13-25] 19 (04/15 0500) BP: (100-134)/(52-90) 117/54 (04/15 0604) SpO2:  [90 %-100 %] 97 % (04/15 0400) Last BM Date : 01/13/24 General: Fatigued appearing 63 year old male in no acute distress. Heart: Regular rate rhythm, no murmurs. Pulm: Breath sounds clear, decreased in the bases. Abdomen: Abdomen is moderately distended with tenderness throughout the RUQ and RLQ without rebound or guarding.  Hypoactive bowel sounds to all 4 quadrants.  No palpable mass. Extremities:  Trace edema to the lower extremities. Multiple small dressings to bilateral lower extremities intact.  Neurologic:  Alert and  oriented x 4. Grossly normal neurologically. Psych:  Alert and cooperative. Normal mood and affect.  Intake/Output from previous day: 04/14 0701 - 04/15 0700 In: 4630.5 [I.V.:2220.7; Blood:1061; IV Piggyback:1348.8] Out: 1000 [Urine:1000] Intake/Output this shift: Total I/O In: 240 [P.O.:240] Out: -   Lab Results: Recent Labs    01/13/24 0642 01/13/24 1624 01/14/24 0557  WBC 21.5* 15.1* 14.8*  HGB 6.5* 6.1* 7.7*  HCT 21.7* 19.8* 24.3*  PLT 239 220 234   BMET Recent Labs    01/12/24 0056 01/13/24 0450 01/14/24 0557  NA 136 136 137  K 3.9 4.1 3.9  CL 106 105 107  CO2 21* 15* 23  GLUCOSE 104* 171* 100*  BUN 17 26* 24*  CREATININE 1.19 1.92* 1.09  CALCIUM 8.1* 7.6* 8.1*   LFT Recent Labs    01/12/24 0056  PROT 7.0   ALBUMIN 3.1*  AST 16  ALT 7  ALKPHOS 51  BILITOT 0.7   PT/INR Recent Labs    01/11/24 1458 01/12/24 1044  LABPROT 25.0* 24.6*  INR 2.2* 2.2*   Hepatitis Panel No results for input(s): "HEPBSAG", "HCVAB", "HEPAIGM", "HEPBIGM" in the last 72 hours.  No results found.  Assessment / Plan:  63 year old male with right mid abdominal/RLQ pain, hematochezia and severe sepsis. Temp 102.55F on 4/13.  CTAP showed inflammatory process involving the proximal ascending colon and terminal ileum with eccentric wall thickening, multiple diverticula in the ascending colon without abscess and scattered left-sided diverticulosis without adjacent inflammatory change concerning for diverticulitis versus colitis or neoplasm. Lactic acid level 8.3 -> 2.7 -> 1.2. WBC 11.7 -> 16.1 -> 21.5 -> 15.1 -> today WBC 14.8. Abdominal pain decreased, abdomen is more distended on exam today. On Zosyn IV. Blood cultures pending. Afebrile today. Hemodynamically stable. -Recommend repeat CTAP with oral and IV contrast today, discussed with Dr. Frederick Peers -Continue IV Zosyn -Pain management per the hospitalist -Clear liquid diet -Eventual colonoscopy to rule out colon malignancy    Acute on chronic iron deficiency anemia.  Admission Hg 8.0 -> 6.7 -> transfused 2 units of PRBCs 4/13 -> Hg 8.0 -> Hg 6.5 on 4/14 -> transfused one unit of PRBCs -> Hg 6.1 -> transfused 2 additional units of PRBCs -> today Hg 7.7. Iron 11.  Ferritin 19.  Received IV iron 4/14.  Last bloody BM was 3 am on 4/14.  Never had an EGD or screening colonoscopy.  - Continue to hold anticoagulation -Transfuse for hemoglobin less than 7 and as needed if symptomatic - EGD and colonoscopy when medically appropriate - Await further recommendations per Dr. Brice Campi   AKI,  Cr 1.42 -> 1.08 -> 1.19 -> 1.92 -> 1.09. - IV fluids per the medical service   History of PE/DVT 07/2023. Coumadin on hold,  last dose was Friday 4/11 at 4 PM   GERD - Pantoprazole 40  mg p.o. daily   Left femoral head AVN per CT - Management per the medical service      Principal Problem:   Severe sepsis [facility]) Active Problems:   Essential hypertension, benign   Hyperlipidemia   History of pulmonary embolism   Diverticulitis   AKI (acute kidney injury) (HCC)   Iron deficiency anemia   ABLA (acute blood loss anemia)     LOS: 3 days   Tory Freiberg  01/14/2024, 8:37 AM
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
## 2024-01-14 NOTE — Plan of Care (Signed)

## 2024-01-15 ENCOUNTER — Inpatient Hospital Stay (HOSPITAL_COMMUNITY): Admitting: Anesthesiology

## 2024-01-15 ENCOUNTER — Inpatient Hospital Stay (HOSPITAL_COMMUNITY)

## 2024-01-15 ENCOUNTER — Encounter (HOSPITAL_COMMUNITY): Payer: Self-pay | Admitting: Internal Medicine

## 2024-01-15 ENCOUNTER — Other Ambulatory Visit: Payer: Self-pay

## 2024-01-15 ENCOUNTER — Encounter (HOSPITAL_COMMUNITY)
Admission: EM | Disposition: A | Discharge status: Other Acute Inpatient Hospital | Payer: Self-pay | Source: Home / Self Care | Attending: Internal Medicine

## 2024-01-15 DIAGNOSIS — R188 Other ascites: Secondary | ICD-10-CM | POA: Insufficient documentation

## 2024-01-15 DIAGNOSIS — K631 Perforation of intestine (nontraumatic): Secondary | ICD-10-CM | POA: Diagnosis not present

## 2024-01-15 DIAGNOSIS — D689 Coagulation defect, unspecified: Secondary | ICD-10-CM

## 2024-01-15 DIAGNOSIS — A419 Sepsis, unspecified organism: Secondary | ICD-10-CM | POA: Diagnosis not present

## 2024-01-15 DIAGNOSIS — D509 Iron deficiency anemia, unspecified: Secondary | ICD-10-CM | POA: Diagnosis not present

## 2024-01-15 DIAGNOSIS — K625 Hemorrhage of anus and rectum: Secondary | ICD-10-CM | POA: Diagnosis not present

## 2024-01-15 DIAGNOSIS — D62 Acute posthemorrhagic anemia: Secondary | ICD-10-CM | POA: Diagnosis not present

## 2024-01-15 DIAGNOSIS — Z7901 Long term (current) use of anticoagulants: Secondary | ICD-10-CM | POA: Insufficient documentation

## 2024-01-15 DIAGNOSIS — R1084 Generalized abdominal pain: Secondary | ICD-10-CM | POA: Insufficient documentation

## 2024-01-15 DIAGNOSIS — R1031 Right lower quadrant pain: Secondary | ICD-10-CM | POA: Diagnosis not present

## 2024-01-15 DIAGNOSIS — J9601 Acute respiratory failure with hypoxia: Secondary | ICD-10-CM

## 2024-01-15 DIAGNOSIS — K921 Melena: Secondary | ICD-10-CM | POA: Diagnosis not present

## 2024-01-15 DIAGNOSIS — R6520 Severe sepsis without septic shock: Secondary | ICD-10-CM | POA: Diagnosis not present

## 2024-01-15 HISTORY — PX: LAPAROTOMY: SHX154

## 2024-01-15 LAB — DIC (DISSEMINATED INTRAVASCULAR COAGULATION)PANEL
D-Dimer, Quant: 1.78 ug{FEU}/mL — ABNORMAL HIGH (ref 0.00–0.50)
D-Dimer, Quant: 2.01 ug{FEU}/mL — ABNORMAL HIGH (ref 0.00–0.50)
Fibrinogen: 375 mg/dL (ref 210–475)
Fibrinogen: 315 mg/dL (ref 210–475)
INR: 3.1 — ABNORMAL HIGH (ref 0.8–1.2)
INR: 3 — ABNORMAL HIGH (ref 0.8–1.2)
Platelets: 227 10*3/uL (ref 150–400)
Platelets: 214 10*3/uL (ref 150–400)
Prothrombin Time: 31.9 s — ABNORMAL HIGH (ref 11.4–15.2)
Prothrombin Time: 31.4 s — ABNORMAL HIGH (ref 11.4–15.2)
Smear Review: NONE SEEN
Smear Review: NONE SEEN
aPTT: 38 s — ABNORMAL HIGH (ref 24–36)
aPTT: 44 s — ABNORMAL HIGH (ref 24–36)

## 2024-01-15 LAB — PREPARE RBC (CROSSMATCH)

## 2024-01-15 LAB — HEMOGLOBIN AND HEMATOCRIT, BLOOD
HCT: 27.6 % — ABNORMAL LOW (ref 39.0–52.0)
HCT: 25.8 % — ABNORMAL LOW (ref 39.0–52.0)
Hemoglobin: 8.6 g/dL — ABNORMAL LOW (ref 13.0–17.0)
Hemoglobin: 8.3 g/dL — ABNORMAL LOW (ref 13.0–17.0)

## 2024-01-15 LAB — BLOOD GAS, ARTERIAL
Acid-Base Excess: 2.7 mmol/L — ABNORMAL HIGH (ref 0.0–2.0)
Acid-base deficit: 7 mmol/L — ABNORMAL HIGH (ref 0.0–2.0)
Bicarbonate: 27.9 mmol/L (ref 20.0–28.0)
Bicarbonate: 20.2 mmol/L (ref 20.0–28.0)
Drawn by: 33147
FIO2: 100 %
FIO2: 70 %
MECHVT: 560 mL
MECHVT: 560 mL
O2 Content: 100 L/min
O2 Saturation: 100 %
O2 Saturation: 100 %
PEEP: 5 cmH2O
PEEP: 5 cmH2O
Patient temperature: 37
Patient temperature: 37.3
RATE: 16 {breaths}/min
RATE: 16 {breaths}/min
pCO2 arterial: 45 mmHg (ref 32–48)
pCO2 arterial: 47 mmHg (ref 32–48)
pH, Arterial: 7.4 (ref 7.35–7.45)
pH, Arterial: 7.25 — ABNORMAL LOW (ref 7.35–7.45)
pO2, Arterial: 210 mmHg — ABNORMAL HIGH (ref 83–108)
pO2, Arterial: 170 mmHg — ABNORMAL HIGH (ref 83–108)

## 2024-01-15 LAB — CBC WITH DIFFERENTIAL/PLATELET
Abs Immature Granulocytes: 0.19 10*3/uL — ABNORMAL HIGH (ref 0.00–0.07)
Basophils Absolute: 0 10*3/uL (ref 0.0–0.1)
Basophils Relative: 0 %
Eosinophils Absolute: 0.3 10*3/uL (ref 0.0–0.5)
Eosinophils Relative: 1 %
HCT: 26.7 % — ABNORMAL LOW (ref 39.0–52.0)
Hemoglobin: 8.4 g/dL — ABNORMAL LOW (ref 13.0–17.0)
Immature Granulocytes: 1 %
Lymphocytes Relative: 8 %
Lymphs Abs: 1.4 10*3/uL (ref 0.7–4.0)
MCH: 25.4 pg — ABNORMAL LOW (ref 26.0–34.0)
MCHC: 31.5 g/dL (ref 30.0–36.0)
MCV: 80.7 fL (ref 80.0–100.0)
Monocytes Absolute: 1.3 10*3/uL — ABNORMAL HIGH (ref 0.1–1.0)
Monocytes Relative: 7 %
Neutro Abs: 15.4 10*3/uL — ABNORMAL HIGH (ref 1.7–7.7)
Neutrophils Relative %: 83 %
Platelets: 320 10*3/uL (ref 150–400)
RBC: 3.31 MIL/uL — ABNORMAL LOW (ref 4.22–5.81)
RDW: 19.9 % — ABNORMAL HIGH (ref 11.5–15.5)
WBC: 18.6 10*3/uL — ABNORMAL HIGH (ref 4.0–10.5)
nRBC: 0.7 % — ABNORMAL HIGH (ref 0.0–0.2)

## 2024-01-15 LAB — COMPREHENSIVE METABOLIC PANEL WITH GFR
ALT: 17 U/L (ref 0–44)
ALT: 15 U/L (ref 0–44)
AST: 21 U/L (ref 15–41)
AST: 24 U/L (ref 15–41)
Albumin: 2.6 g/dL — ABNORMAL LOW (ref 3.5–5.0)
Albumin: 3 g/dL — ABNORMAL LOW (ref 3.5–5.0)
Alkaline Phosphatase: 33 U/L — ABNORMAL LOW (ref 38–126)
Alkaline Phosphatase: 27 U/L — ABNORMAL LOW (ref 38–126)
Anion gap: 9 (ref 5–15)
Anion gap: 11 (ref 5–15)
BUN: 30 mg/dL — ABNORMAL HIGH (ref 8–23)
BUN: 36 mg/dL — ABNORMAL HIGH (ref 8–23)
CO2: 24 mmol/L (ref 22–32)
CO2: 19 mmol/L — ABNORMAL LOW (ref 22–32)
Calcium: 8.1 mg/dL — ABNORMAL LOW (ref 8.9–10.3)
Calcium: 10.8 mg/dL — ABNORMAL HIGH (ref 8.9–10.3)
Chloride: 105 mmol/L (ref 98–111)
Chloride: 107 mmol/L (ref 98–111)
Creatinine, Ser: 1.28 mg/dL — ABNORMAL HIGH (ref 0.61–1.24)
Creatinine, Ser: 1.86 mg/dL — ABNORMAL HIGH (ref 0.61–1.24)
GFR, Estimated: >60 mL/min (ref >60)
GFR, Estimated: 41 mL/min — ABNORMAL LOW (ref >60)
Glucose, Bld: 184 mg/dL — ABNORMAL HIGH (ref 70–99)
Glucose, Bld: 251 mg/dL — ABNORMAL HIGH (ref 70–99)
Potassium: 3.9 mmol/L (ref 3.5–5.1)
Potassium: 4.1 mmol/L (ref 3.5–5.1)
Sodium: 138 mmol/L (ref 135–145)
Sodium: 137 mmol/L (ref 135–145)
Total Bilirubin: 0.8 mg/dL (ref 0.0–1.2)
Total Bilirubin: 0.8 mg/dL (ref 0.0–1.2)
Total Protein: 5.3 g/dL — ABNORMAL LOW (ref 6.5–8.1)
Total Protein: 5.2 g/dL — ABNORMAL LOW (ref 6.5–8.1)

## 2024-01-15 LAB — CBC
HCT: 25.6 % — ABNORMAL LOW (ref 39.0–52.0)
HCT: 23.1 % — ABNORMAL LOW (ref 39.0–52.0)
Hemoglobin: 8 g/dL — ABNORMAL LOW (ref 13.0–17.0)
Hemoglobin: 7.2 g/dL — ABNORMAL LOW (ref 13.0–17.0)
MCH: 25.7 pg — ABNORMAL LOW (ref 26.0–34.0)
MCH: 27.3 pg (ref 26.0–34.0)
MCHC: 31.3 g/dL (ref 30.0–36.0)
MCHC: 31.2 g/dL (ref 30.0–36.0)
MCV: 82.3 fL (ref 80.0–100.0)
MCV: 87.5 fL (ref 80.0–100.0)
Platelets: 292 10*3/uL (ref 150–400)
Platelets: 239 10*3/uL (ref 150–400)
RBC: 3.11 MIL/uL — ABNORMAL LOW (ref 4.22–5.81)
RBC: 2.64 MIL/uL — ABNORMAL LOW (ref 4.22–5.81)
RDW: 20.1 % — ABNORMAL HIGH (ref 11.5–15.5)
RDW: 19.9 % — ABNORMAL HIGH (ref 11.5–15.5)
WBC: 6.6 10*3/uL (ref 4.0–10.5)
WBC: 9.8 10*3/uL (ref 4.0–10.5)
nRBC: 3.8 % — ABNORMAL HIGH (ref 0.0–0.2)
nRBC: 7.9 % — ABNORMAL HIGH (ref 0.0–0.2)

## 2024-01-15 LAB — POCT I-STAT, CHEM 8
BUN: 30 mg/dL — ABNORMAL HIGH (ref 8–23)
Calcium, Ion: 1.49 mmol/L — ABNORMAL HIGH (ref 1.15–1.40)
Chloride: 106 mmol/L (ref 98–111)
Creatinine, Ser: 2.1 mg/dL — ABNORMAL HIGH (ref 0.61–1.24)
Glucose, Bld: 263 mg/dL — ABNORMAL HIGH (ref 70–99)
HCT: 22 % — ABNORMAL LOW (ref 39.0–52.0)
Hemoglobin: 7.5 g/dL — ABNORMAL LOW (ref 13.0–17.0)
Potassium: 4.2 mmol/L (ref 3.5–5.1)
Sodium: 139 mmol/L (ref 135–145)
TCO2: 22 mmol/L (ref 22–32)

## 2024-01-15 LAB — GLUCOSE, CAPILLARY
Glucose-Capillary: 132 mg/dL — ABNORMAL HIGH (ref 70–99)
Glucose-Capillary: 214 mg/dL — ABNORMAL HIGH (ref 70–99)

## 2024-01-15 LAB — BASIC METABOLIC PANEL WITH GFR
Anion gap: 11 (ref 5–15)
BUN: 24 mg/dL — ABNORMAL HIGH (ref 8–23)
CO2: 26 mmol/L (ref 22–32)
Calcium: 8.4 mg/dL — ABNORMAL LOW (ref 8.9–10.3)
Chloride: 102 mmol/L (ref 98–111)
Creatinine, Ser: 0.97 mg/dL (ref 0.61–1.24)
GFR, Estimated: >60 mL/min (ref >60)
Glucose, Bld: 115 mg/dL — ABNORMAL HIGH (ref 70–99)
Potassium: 3.5 mmol/L (ref 3.5–5.1)
Sodium: 139 mmol/L (ref 135–145)

## 2024-01-15 LAB — MAGNESIUM: Magnesium: 2.4 mg/dL (ref 1.7–2.4)

## 2024-01-15 LAB — PREALBUMIN: Prealbumin: 7 mg/dL — ABNORMAL LOW (ref 18–38)

## 2024-01-15 LAB — TROPONIN I (HIGH SENSITIVITY): Troponin I (High Sensitivity): 9 ng/L (ref <18)

## 2024-01-15 LAB — LACTIC ACID, PLASMA: Lactic Acid, Venous: 4.4 mmol/L (ref 0.5–1.9)

## 2024-01-15 SURGERY — LAPAROTOMY, EXPLORATORY
Anesthesia: General

## 2024-01-15 MED ORDER — KETAMINE HCL 50 MG/5ML IJ SOSY
PREFILLED_SYRINGE | INTRAMUSCULAR | Status: AC
  Filled 2024-01-15: qty 5

## 2024-01-15 MED ORDER — SODIUM CHLORIDE 0.9% IV SOLUTION: Freq: Once | INTRAVENOUS | Status: AC

## 2024-01-15 MED ORDER — VASOPRESSIN 20 UNITS/100 ML INFUSION FOR SHOCK
0.0000 [IU]/min | INTRAVENOUS | Status: DC
  Administered 2024-01-16 – 2024-01-19 (×9): 0.04 [IU]/min via INTRAVENOUS
  Administered 2024-01-19: 0.03 [IU]/min via INTRAVENOUS
  Filled 2024-01-15 (×11): qty 100

## 2024-01-15 MED ORDER — KETAMINE HCL 50 MG/5ML IJ SOSY
PREFILLED_SYRINGE | INTRAMUSCULAR | Status: DC | PRN
  Administered 2024-01-15: 10 mg via INTRAVENOUS
  Administered 2024-01-15: 30 mg via INTRAVENOUS
  Administered 2024-01-15: 10 mg via INTRAVENOUS

## 2024-01-15 MED ORDER — FENTANYL CITRATE PF 50 MCG/ML IJ SOSY
50.0000 ug | PREFILLED_SYRINGE | Freq: Once | INTRAMUSCULAR | Status: AC
  Administered 2024-01-15: 50 ug via INTRAVENOUS

## 2024-01-15 MED ORDER — FENTANYL CITRATE PF 50 MCG/ML IJ SOSY
50.0000 ug | PREFILLED_SYRINGE | INTRAMUSCULAR | Status: DC | PRN
  Administered 2024-01-15 – 2024-01-18 (×2): 100 ug via INTRAVENOUS
  Administered 2024-01-19: 50 ug via INTRAVENOUS
  Administered 2024-01-20 – 2024-01-23 (×12): 100 ug via INTRAVENOUS
  Filled 2024-01-15: qty 2
  Filled 2024-01-15: qty 1
  Filled 2024-01-15 (×3): qty 2
  Filled 2024-01-15: qty 1
  Filled 2024-01-15 (×6): qty 2

## 2024-01-15 MED ORDER — 0.9 % SODIUM CHLORIDE (POUR BTL) OPTIME
TOPICAL | Status: DC | PRN
  Administered 2024-01-15: 5000 mL
  Administered 2024-01-15: 1000 mL

## 2024-01-15 MED ORDER — ENOXAPARIN SODIUM 40 MG/0.4ML IJ SOSY: 40.0000 mg | PREFILLED_SYRINGE | INTRAMUSCULAR | Status: DC

## 2024-01-15 MED ORDER — FENTANYL BOLUS VIA INFUSION
50.0000 ug | INTRAVENOUS | Status: DC | PRN
  Administered 2024-01-15: 50 ug via INTRAVENOUS
  Administered 2024-01-15: 75 ug via INTRAVENOUS
  Administered 2024-01-15: 100 ug via INTRAVENOUS
  Administered 2024-01-16: 50 ug via INTRAVENOUS
  Administered 2024-01-18: 100 ug via INTRAVENOUS
  Administered 2024-01-19: 50 ug via INTRAVENOUS
  Administered 2024-01-19 (×2): 100 ug via INTRAVENOUS
  Administered 2024-01-20: 50 ug via INTRAVENOUS
  Administered 2024-01-21 (×3): 100 ug via INTRAVENOUS
  Administered 2024-01-21 – 2024-01-22 (×2): 50 ug via INTRAVENOUS
  Administered 2024-01-22: 100 ug via INTRAVENOUS
  Administered 2024-01-22: 25 ug via INTRAVENOUS

## 2024-01-15 MED ORDER — ALBUMIN HUMAN 25 % IV SOLN
12.5000 g | Freq: Once | INTRAVENOUS | Status: AC
  Administered 2024-01-15: 12.5 g via INTRAVENOUS

## 2024-01-15 MED ORDER — FENTANYL CITRATE (PF) 100 MCG/2ML IJ SOLN
INTRAMUSCULAR | Status: DC | PRN
  Administered 2024-01-15 (×4): 50 ug via INTRAVENOUS

## 2024-01-15 MED ORDER — ALBUMIN HUMAN 5 % IV SOLN
INTRAVENOUS | Status: AC
  Filled 2024-01-15: qty 250

## 2024-01-15 MED ORDER — PROPOFOL 10 MG/ML IV BOLUS
INTRAVENOUS | Status: DC | PRN
Start: 2024-01-15 — End: 2024-01-15
  Administered 2024-01-15: 100 mg via INTRAVENOUS

## 2024-01-15 MED ORDER — KCL IN DEXTROSE-NACL 20-5-0.45 MEQ/L-%-% IV SOLN
INTRAVENOUS | Status: DC
  Filled 2024-01-15 (×3): qty 1000

## 2024-01-15 MED ORDER — PHENYLEPHRINE HCL (PRESSORS) 10 MG/ML IV SOLN
INTRAVENOUS | Status: AC
  Filled 2024-01-15: qty 1

## 2024-01-15 MED ORDER — FENTANYL CITRATE (PF) 100 MCG/2ML IJ SOLN
INTRAMUSCULAR | Status: AC
  Filled 2024-01-15: qty 2

## 2024-01-15 MED ORDER — PHENYLEPHRINE HCL-NACL 20-0.9 MG/250ML-% IV SOLN
INTRAVENOUS | Status: DC | PRN
  Administered 2024-01-15: 40 ug/min via INTRAVENOUS

## 2024-01-15 MED ORDER — EPINEPHRINE HCL 5 MG/250ML IV SOLN IN NS
INTRAVENOUS | Status: AC
  Filled 2024-01-15: qty 250

## 2024-01-15 MED ORDER — VASOPRESSIN 20 UNITS/100 ML INFUSION FOR SHOCK
INTRAVENOUS | Status: AC
  Administered 2024-01-15: 0.04 [IU]/min via INTRAVENOUS
  Filled 2024-01-15: qty 100

## 2024-01-15 MED ORDER — PROPOFOL 10 MG/ML IV BOLUS
INTRAVENOUS | Status: AC
  Filled 2024-01-15: qty 20

## 2024-01-15 MED ORDER — SUCCINYLCHOLINE CHLORIDE 200 MG/10ML IV SOSY
PREFILLED_SYRINGE | INTRAVENOUS | Status: AC
  Filled 2024-01-15: qty 10

## 2024-01-15 MED ORDER — MIDAZOLAM HCL 2 MG/2ML IJ SOLN
INTRAMUSCULAR | Status: AC
  Filled 2024-01-15: qty 2

## 2024-01-15 MED ORDER — SUCCINYLCHOLINE CHLORIDE 200 MG/10ML IV SOSY
PREFILLED_SYRINGE | INTRAVENOUS | Status: DC | PRN
  Administered 2024-01-15: 40 mg via INTRAVENOUS
  Administered 2024-01-15: 80 mg via INTRAVENOUS

## 2024-01-15 MED ORDER — ALBUMIN HUMAN 5 % IV SOLN
25.0000 g | Freq: Once | INTRAVENOUS | Status: AC
  Administered 2024-01-15: 25 g via INTRAVENOUS
  Filled 2024-01-15: qty 500

## 2024-01-15 MED ORDER — PHENYLEPHRINE HCL (PRESSORS) 10 MG/ML IV SOLN
INTRAVENOUS | Status: AC
Start: 2024-01-15 — End: ?
  Filled 2024-01-15: qty 1

## 2024-01-15 MED ORDER — AMIODARONE IV BOLUS ONLY 150 MG/100ML
INTRAVENOUS | Status: AC
  Filled 2024-01-15: qty 100

## 2024-01-15 MED ORDER — DOCUSATE SODIUM 50 MG/5ML PO LIQD: 100.0000 mg | Freq: Two times a day (BID) | ORAL | Status: DC

## 2024-01-15 MED ORDER — MIDAZOLAM HCL 2 MG/2ML IJ SOLN
INTRAMUSCULAR | Status: DC | PRN
  Administered 2024-01-15: 2 mg via INTRAVENOUS

## 2024-01-15 MED ORDER — NOREPINEPHRINE 16 MG/250ML-% IV SOLN
0.0000 ug/min | INTRAVENOUS | Status: DC
  Administered 2024-01-15: 34 ug/min via INTRAVENOUS
  Administered 2024-01-16: 60 ug/min via INTRAVENOUS
  Administered 2024-01-16: 48 ug/min via INTRAVENOUS
  Administered 2024-01-16: 46 ug/min via INTRAVENOUS
  Administered 2024-01-16: 60 ug/min via INTRAVENOUS
  Administered 2024-01-17: 27 ug/min via INTRAVENOUS
  Administered 2024-01-18: 11 ug/min via INTRAVENOUS
  Administered 2024-01-18: 9 ug/min via INTRAVENOUS
  Administered 2024-01-19: 6 ug/min via INTRAVENOUS
  Filled 2024-01-15 (×12): qty 250

## 2024-01-15 MED ORDER — ALBUMIN HUMAN 25 % IV SOLN
12.5000 g | Freq: Once | INTRAVENOUS | Status: AC
  Administered 2024-01-15: 12.5 g via INTRAVENOUS
  Filled 2024-01-15: qty 50

## 2024-01-15 MED ORDER — STERILE WATER FOR IRRIGATION IR SOLN
Status: DC | PRN
  Administered 2024-01-15: 1000 mL

## 2024-01-15 MED ORDER — PHENYLEPHRINE 80 MCG/ML (10ML) SYRINGE FOR IV PUSH (FOR BLOOD PRESSURE SUPPORT)
PREFILLED_SYRINGE | INTRAVENOUS | Status: AC
  Filled 2024-01-15: qty 10

## 2024-01-15 MED ORDER — ORAL CARE MOUTH RINSE: 15.0000 mL | OROMUCOSAL | Status: DC | PRN

## 2024-01-15 MED ORDER — LIDOCAINE HCL (PF) 2 % IJ SOLN
INTRAMUSCULAR | Status: AC
  Filled 2024-01-15: qty 5

## 2024-01-15 MED ORDER — LABETALOL HCL 5 MG/ML IV SOLN: 20.0000 mg | INTRAVENOUS | Status: DC | PRN

## 2024-01-15 MED ORDER — ALBUMIN HUMAN 5 % IV SOLN
INTRAVENOUS | Status: AC
  Filled 2024-01-15: qty 500

## 2024-01-15 MED ORDER — DEXAMETHASONE SODIUM PHOSPHATE 10 MG/ML IJ SOLN
INTRAMUSCULAR | Status: DC | PRN
  Administered 2024-01-15: 8 mg via INTRAVENOUS

## 2024-01-15 MED ORDER — PROPOFOL 1000 MG/100ML IV EMUL
0.0000 ug/kg/min | INTRAVENOUS | Status: DC
  Administered 2024-01-15 (×2): 40 ug/kg/min via INTRAVENOUS
  Administered 2024-01-16: 45 ug/kg/min via INTRAVENOUS
  Administered 2024-01-16: 50 ug/kg/min via INTRAVENOUS
  Administered 2024-01-16: 25 ug/kg/min via INTRAVENOUS
  Administered 2024-01-16: 50 ug/kg/min via INTRAVENOUS
  Administered 2024-01-16: 25 ug/kg/min via INTRAVENOUS
  Administered 2024-01-17: 50 ug/kg/min via INTRAVENOUS
  Administered 2024-01-17: 45 ug/kg/min via INTRAVENOUS
  Administered 2024-01-17 (×2): 50 ug/kg/min via INTRAVENOUS
  Administered 2024-01-17: 45 ug/kg/min via INTRAVENOUS
  Administered 2024-01-17 – 2024-01-18 (×3): 50 ug/kg/min via INTRAVENOUS
  Administered 2024-01-18 – 2024-01-19 (×2): 30 ug/kg/min via INTRAVENOUS
  Filled 2024-01-15 (×18): qty 100

## 2024-01-15 MED ORDER — ONDANSETRON HCL 4 MG/2ML IJ SOLN
INTRAMUSCULAR | Status: AC
  Filled 2024-01-15: qty 2

## 2024-01-15 MED ORDER — LACTATED RINGERS IV SOLN: INTRAVENOUS | Status: DC | PRN

## 2024-01-15 MED ORDER — LIDOCAINE HCL (CARDIAC) PF 100 MG/5ML IV SOSY
PREFILLED_SYRINGE | INTRAVENOUS | Status: DC | PRN
  Administered 2024-01-15: 100 mg via INTRATRACHEAL

## 2024-01-15 MED ORDER — FENTANYL CITRATE PF 50 MCG/ML IJ SOSY
50.0000 ug | PREFILLED_SYRINGE | INTRAMUSCULAR | Status: AC | PRN
  Administered 2024-01-15 – 2024-01-22 (×3): 50 ug via INTRAVENOUS
  Filled 2024-01-15 (×2): qty 1

## 2024-01-15 MED ORDER — POLYETHYLENE GLYCOL 3350 17 G PO PACK: 17.0000 g | PACK | Freq: Every day | ORAL | Status: DC

## 2024-01-15 MED ORDER — ALBUMIN HUMAN 5 % IV SOLN
INTRAVENOUS | Status: DC | PRN
Start: 2024-01-15 — End: 2024-01-15

## 2024-01-15 MED ORDER — FENTANYL CITRATE (PF) 100 MCG/2ML IJ SOLN
INTRAMUSCULAR | Status: AC
Start: 2024-01-15 — End: ?
  Filled 2024-01-15: qty 2

## 2024-01-15 MED ORDER — NOREPINEPHRINE 4 MG/250ML-% IV SOLN
0.0000 ug/min | INTRAVENOUS | Status: DC
  Administered 2024-01-15: 2 ug/min via INTRAVENOUS
  Filled 2024-01-15: qty 250

## 2024-01-15 MED ORDER — ROCURONIUM BROMIDE 100 MG/10ML IV SOLN
INTRAVENOUS | Status: DC | PRN
Start: 2024-01-15 — End: 2024-01-15
  Administered 2024-01-15: 20 mg via INTRAVENOUS
  Administered 2024-01-15: 40 mg via INTRAVENOUS

## 2024-01-15 MED ORDER — PHENYLEPHRINE 80 MCG/ML (10ML) SYRINGE FOR IV PUSH (FOR BLOOD PRESSURE SUPPORT)
PREFILLED_SYRINGE | INTRAVENOUS | Status: DC | PRN
  Administered 2024-01-15: 120 ug via INTRAVENOUS

## 2024-01-15 MED ORDER — ROCURONIUM BROMIDE 10 MG/ML (PF) SYRINGE
PREFILLED_SYRINGE | INTRAVENOUS | Status: AC
  Filled 2024-01-15: qty 10

## 2024-01-15 MED ORDER — EPINEPHRINE 1 MG/10ML IJ SOSY
PREFILLED_SYRINGE | INTRAMUSCULAR | Status: AC
  Filled 2024-01-15: qty 10

## 2024-01-15 MED ORDER — FENTANYL 2500MCG IN NS 250ML (10MCG/ML) PREMIX INFUSION
50.0000 ug/h | INTRAVENOUS | Status: DC
  Administered 2024-01-15: 50 ug/h via INTRAVENOUS
  Administered 2024-01-16 (×2): 150 ug/h via INTRAVENOUS
  Administered 2024-01-17: 200 ug/h via INTRAVENOUS
  Administered 2024-01-18: 175 ug/h via INTRAVENOUS
  Administered 2024-01-18 – 2024-01-19 (×3): 100 ug/h via INTRAVENOUS
  Administered 2024-01-20: 200 ug/h via INTRAVENOUS
  Administered 2024-01-21: 50 ug/h via INTRAVENOUS
  Filled 2024-01-15 (×9): qty 250

## 2024-01-15 MED ORDER — HYDROCORTISONE SOD SUC (PF) 100 MG IJ SOLR
50.0000 mg | Freq: Four times a day (QID) | INTRAMUSCULAR | Status: DC
  Administered 2024-01-15 – 2024-01-16 (×2): 50 mg via INTRAVENOUS
  Filled 2024-01-15 (×2): qty 2

## 2024-01-15 MED ORDER — ONDANSETRON HCL 4 MG/2ML IJ SOLN
INTRAMUSCULAR | Status: DC | PRN
  Administered 2024-01-15: 4 mg via INTRAVENOUS

## 2024-01-15 MED ORDER — ALBUMIN HUMAN 25 % IV SOLN
INTRAVENOUS | Status: AC
Start: 2024-01-15 — End: 2024-01-16
  Filled 2024-01-15: qty 100

## 2024-01-15 MED ORDER — DEXAMETHASONE SODIUM PHOSPHATE 10 MG/ML IJ SOLN
INTRAMUSCULAR | Status: AC
  Filled 2024-01-15: qty 1

## 2024-01-15 MED ORDER — CALCIUM GLUCONATE-NACL 2-0.675 GM/100ML-% IV SOLN
2.0000 g | Freq: Once | INTRAVENOUS | Status: DC
  Filled 2024-01-15: qty 100

## 2024-01-15 MED ORDER — PROPOFOL 1000 MG/100ML IV EMUL: 5.0000 ug/kg/min | INTRAVENOUS | Status: DC

## 2024-01-15 MED ORDER — ORAL CARE MOUTH RINSE
15.0000 mL | OROMUCOSAL | Status: DC
  Administered 2024-01-15 (×2): 15 mL via OROMUCOSAL

## 2024-01-15 MED ORDER — EPINEPHRINE HCL 5 MG/250ML IV SOLN IN NS
0.5000 ug/min | INTRAVENOUS | Status: DC
  Administered 2024-01-15: 0.5 ug/min via INTRAVENOUS
  Administered 2024-01-16: 14 ug/min via INTRAVENOUS
  Administered 2024-01-16: 0.5 ug/min via INTRAVENOUS
  Administered 2024-01-16: 17 ug/min via INTRAVENOUS
  Administered 2024-01-16 (×2): 40 ug/min via INTRAVENOUS
  Filled 2024-01-15: qty 500
  Filled 2024-01-15 (×3): qty 250

## 2024-01-15 MED FILL — Medication: Qty: 1 | Status: AC

## 2024-01-15 SURGICAL SUPPLY — 47 items
BAG COUNTER SPONGE SURGICOUNT (BAG) IMPLANT
BLADE EXTENDED COATED 6.5IN (ELECTRODE) ×1 IMPLANT
CELLS DAT CNTRL 66122 CELL SVR (MISCELLANEOUS) IMPLANT
CLIP TI LARGE 6 (CLIP) IMPLANT
COVER SURGICAL LIGHT HANDLE (MISCELLANEOUS) ×1 IMPLANT
DRAIN CHANNEL 19F RND (DRAIN) IMPLANT
DRAPE LAPAROSCOPIC ABDOMINAL (DRAPES) IMPLANT
DRSG OPSITE POSTOP 4X6 (GAUZE/BANDAGES/DRESSINGS) IMPLANT
DRSG OPSITE POSTOP 4X8 (GAUZE/BANDAGES/DRESSINGS) IMPLANT
ELECT REM PT RETURN 15FT ADLT (MISCELLANEOUS) ×1 IMPLANT
EVACUATOR SILICONE 100CC (DRAIN) ×1 IMPLANT
GAUZE SPONGE 4X4 12PLY STRL (GAUZE/BANDAGES/DRESSINGS) ×1 IMPLANT
GLOVE BIO SURGEON STRL SZ7.5 (GLOVE) ×1 IMPLANT
GLOVE INDICATOR 8.0 STRL GRN (GLOVE) ×1 IMPLANT
GOWN SPEC L4 XLG W/TWL (GOWN DISPOSABLE) ×1 IMPLANT
HANDLE SUCTION POOLE (INSTRUMENTS) IMPLANT
KIT TURNOVER KIT A (KITS) IMPLANT
LEGGING LITHOTOMY PAIR STRL (DRAPES) IMPLANT
LIGASURE IMPACT 36 18CM CVD LR (INSTRUMENTS) IMPLANT
NS IRRIG 1000ML POUR BTL (IV SOLUTION) ×2 IMPLANT
PACK GENERAL/GYN (CUSTOM PROCEDURE TRAY) ×1 IMPLANT
RELOAD PROXIMATE 75MM BLUE (ENDOMECHANICALS) ×2 IMPLANT
RELOAD STAPLE 75 3.8 BLU REG (ENDOMECHANICALS) IMPLANT
RETRACTOR WND ALEXIS 18 MED (MISCELLANEOUS) IMPLANT
RETRACTOR WND ALEXIS 25 LRG (MISCELLANEOUS) IMPLANT
RETRACTOR WOUND ALXS 25CM LRG (MISCELLANEOUS) ×1 IMPLANT
RTRCTR WOUND ALEXIS 18CM MED (MISCELLANEOUS) IMPLANT
RTRCTR WOUND ALEXIS 25CM LRG (MISCELLANEOUS) ×1 IMPLANT
SHEARS FOC LG CVD HARMONIC 17C (MISCELLANEOUS) IMPLANT
SHEARS HARMONIC 36 ACE (MISCELLANEOUS) IMPLANT
SPONGE ABD ABTHERA ADVANCE (MISCELLANEOUS) IMPLANT
STAPLER PROXIMATE 75MM BLUE (STAPLE) IMPLANT
STAPLER SKIN PROX WIDE 3.9 (STAPLE) ×1 IMPLANT
SUCTION POOLE HANDLE (INSTRUMENTS) ×2 IMPLANT
SUT PDS AB 1 TP1 96 (SUTURE) IMPLANT
SUT PDS AB 3-0 SH 27 (SUTURE) ×1 IMPLANT
SUT PDS AB 4-0 SH 27 (SUTURE) IMPLANT
SUT PROLENE 2 0 BLUE (SUTURE) IMPLANT
SUT SILK 2 0 30 PSL (SUTURE) IMPLANT
SUT SILK 2 0 SH CR/8 (SUTURE) ×2 IMPLANT
SUT SILK 2 0SH CR/8 30 (SUTURE) IMPLANT
SUT SILK 2-0 18XBRD TIE 12 (SUTURE) ×2 IMPLANT
SUT SILK 2-0 30XBRD TIE 12 (SUTURE) IMPLANT
SUT SILK 3 0 SH CR/8 (SUTURE) ×2 IMPLANT
SUT SILK 3-0 18XBRD TIE 12 (SUTURE) ×2 IMPLANT
TOWEL OR 17X26 10 PK STRL BLUE (TOWEL DISPOSABLE) ×1 IMPLANT
TRAY FOLEY MTR SLVR 16FR STAT (SET/KITS/TRAYS/PACK) ×1 IMPLANT

## 2024-01-15 NOTE — Progress Notes (Signed)
 Surgery team notified of continuous bloody output from wound vac since returning from surgery, about 1L so far, with decreased blood pressure requiring pressor initiation and blood transfusion. Per surgery team, this is bloody ascites and no further interventions can be done from their stand point at this time. Plan to continue current blood pressure support measures.

## 2024-01-15 NOTE — Progress Notes (Signed)
ABG sent to lab. Lab made aware.

## 2024-01-15 NOTE — Progress Notes (Addendum)
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 of intravenous contrast. RADIATION DOSE REDUCTION: This exam was performed according to the departmental dose-optimization program which includes automated exposure control, adjustment of the mA and/or kV according to patient size and/or use of iterative reconstruction technique. CONTRAST:  OMNIPAQUE IOHEXOL 300 MG/ML  SOLN COMPARISON:  09/28/2019 FINDINGS: Lower chest: No pleural or pericardial  effusion. Dependent atelectasis posteriorly in both lower lobes. Hepatobiliary: No focal liver abnormality is seen. No gallstones, gallbladder wall thickening, or biliary  dilatation. Pancreas: Unremarkable. No pancreatic ductal dilatation or surrounding inflammatory changes. Spleen: Normal in size without focal abnormality. Adrenals/Urinary Tract: Adrenal glands are unremarkable. Kidneys are normal, without renal calculi, focal lesion, or hydronephrosis. Bladder is unremarkable. Stomach/Bowel: Stomach is decompressed, without acute finding. Scattered gas and fluid filled small bowel loops without dilatation. There is inflammatory process involving the proximal ascending colon including terminal ileum, with eccentric wall thickening. Multiple colonic diverticula in the ascending colon. No definite drainable abscess. More distal colon is partially distended. Scattered diverticula in the distal descending segment without adjacent inflammatory change. Vascular/Lymphatic: Scattered aortoiliac calcified plaque without aneurysm. Portal vein patent. No retroperitoneal or pelvic adenopathy. Prominent subcentimeter lymph node in the right colon mesentery. No overt mesenteric adenopathy. Reproductive: Mild prostate enlargement. Other: No ascites.  No free air. Musculoskeletal: Spondylitic changes in the lower lumbar spine. 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 IMPRESSION: 1. Inflammatory process involving the proximal ascending colon including terminal ileum, with eccentric wall thickening. Differential considerations include diverticulitis, colitis, and neoplasm. Recommend follow-up colonoscopy when clinically appropriate. 2. 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 Electronically Signed   By: Nicoletta Barrier M.D.   On: 01/11/2024 17:15    Medications / Allergies: per chart  Antibiotics: Anti-infectives (From admission, onward)    Start     Dose/Rate Route Frequency Ordered Stop    01/12/24 0200  piperacillin-tazobactam (ZOSYN) IVPB 3.375 g        3.375 g 12.5 mL/hr over 240 Minutes Intravenous Every 8 hours 01/11/24 2120     01/11/24 1815  piperacillin-tazobactam (ZOSYN) IVPB 3.375 g        3.375 g 100 mL/hr over 30 Minutes Intravenous  Once 01/11/24 1806 01/11/24 1921         Note: Portions of this report may have been transcribed using voice recognition software. Every effort was made to ensure accuracy; however, inadvertent computerized transcription errors may be present.   Any transcriptional errors that result from this process are unintentional.    Eddye Goodie, MD, FACS, MASCRS Esophageal, Gastrointestinal & Colorectal Surgery Robotic and Minimally Invasive Surgery  [facility] [address], [address] [place], [place] [address] [phone] Fax [phone] Main  CONTACT INFORMATION: Weekday (9AM-5PM): Call [facility] main office at [phone] Weeknight (5PM-9AM) or Weekend/Holiday: Check EPIC "Web Links" tab & use "AMION" (password " [email or web address]") for General Surgery [facility] coverage  Please, DO NOT use SecureChat  (it is not reliable communication to reach operating surgeons & will lead to a delay in care).   Epic staff messaging available for outptient concerns needing 1-2 business day response.       01/15/2024  8:38 PM

## 2024-01-15 NOTE — Progress Notes (Signed)
 [facility] Progress Note  CC:  Right sided abdominal pain, hematochezia   Subjective: He continues to have right-sided abdominal pain which she stated is not any worse than it was yesterday.  He stated "lets get this over with, referring to colon resection surgery today.  He remains NPO.  No BM overnight.   Objective:  Vital signs in last 24 hours: Temp:  [97.7 F (36.5 C)-99.1 F (37.3 C)] 97.7 F (36.5 C) (04/16 0758) Pulse Rate:  [95-111] 98 (04/16 0742) Resp:  [15-28] 21 (04/16 0742) BP: (118-143)/(77-100) 138/100 (04/16 0742) SpO2:  [91 %-99 %] 92 % (04/16 0742) Last BM Date : 01/13/24 General: Alert 63 year old male fatigued.  In no acute distress. Heart: Regular rate and rhythm, no murmurs. Pulm: Breath sounds clear, decreased in the bases. Abdomen: Distended abdomen with tenderness throughout the right upper and lower quadrant without rebound or guarding.  Very few bowel sounds auscultated.  No palpable mass.  No bruit. Extremities: Mild lower extremity edema. Neurologic:  Alert and  oriented x 4.  Speech is clear.  Moves all extremities equally. Psych:  Alert and cooperative. Normal mood and affect.  Intake/Output from previous day: 04/15 0701 - 04/16 0700 In: 1610.1 [P.O.:1470; IV Piggyback:140.1] Out: 850 [Urine:850] Intake/Output this shift: No intake/output data recorded.  Lab Results: Recent Labs    01/13/24 1624 01/14/24 0557 01/14/24 1820 01/15/24 0254  WBC 15.1* 14.8*  --  18.6*  HGB 6.1* 7.7* 8.6* 8.4*  HCT 19.8* 24.3* 28.1* 26.7*  PLT 220 234  --  320   BMET Recent Labs    01/13/24 0450 01/14/24 0557 01/15/24 0254  NA 136 137 139  K 4.1 3.9 3.5  CL 105 107 102  CO2 15* 23 26  GLUCOSE 171* 100* 115*  BUN 26* 24* 24*  CREATININE 1.92* 1.09 0.97  CALCIUM 7.6* 8.1* 8.4*   LFT No results for input(s): "PROT", "ALBUMIN", "AST", "ALT", "ALKPHOS", "BILITOT", "BILIDIR", "IBILI" in the last 72 hours. PT/INR Recent Labs     01/12/24 1044  LABPROT 24.6*  INR 2.2*   Hepatitis Panel No results for input(s): "HEPBSAG", "HCVAB", "HEPAIGM", "HEPBIGM" in the last 72 hours.  CT ABDOMEN PELVIS W CONTRAST Result Date: 01/14/2024 CLINICAL DATA:  Follow-up diverticulitis. EXAM: CT ABDOMEN AND PELVIS WITH CONTRAST TECHNIQUE: Multidetector CT imaging of the abdomen and pelvis was performed using the standard protocol following bolus administration of intravenous contrast. RADIATION DOSE REDUCTION: This exam was performed according to the departmental dose-optimization program which includes automated exposure control, adjustment of the mA and/or kV according to patient size and/or use of iterative reconstruction technique. CONTRAST:  OMNIPAQUE IOHEXOL 300 MG/ML  SOLN COMPARISON:  Abdominopelvic CT 01/11/2024 and 09/28/2019. FINDINGS: Lower chest: Pendant consolidation in both lower lobes with small bilateral pleural effusions. While this could be secondary to atelectasis, cannot exclude aspiration. No significant pericardial fluid. Aortic and coronary artery atherosclerosis noted. Hepatobiliary: The liver is normal in density without suspicious focal abnormality. No evidence of gallstones, gallbladder wall thickening or biliary dilatation. Pancreas: Unremarkable. No pancreatic ductal dilatation or surrounding inflammatory changes. Spleen: Normal in size without focal abnormality. Adrenals/Urinary Tract: Both adrenal glands appear normal. No evidence of urinary tract calculus, suspicious renal lesion or hydronephrosis. The bladder appears unremarkable for its degree of distention. Stomach/Bowel: No enteric contrast administered. The stomach is mildly distended with fluid and air. There is mild diffuse small bowel distension. The appendix appears normal. Prominent stool in the right colon with persistent heterogeneous  wall thickening and surrounding inflammation. There is suspicion of a contained perforation along the medial aspect of  the ascending colon within enlarging extraluminal collection of air and gas, measuring up to 4.4 x 4.6 cm on image 51/2. No distal colonic wall thickening identified. There is mild distal colonic diverticulosis and distension. Vascular/Lymphatic: There are no enlarged abdominal or pelvic lymph nodes. Aortic and branch vessel atherosclerosis without evidence of aneurysm or large vessel occlusion. The portal, superior mesenteric and splenic veins are patent. Reproductive: Stable mild enlargement of the prostate gland. Other: Mild generalized subcutaneous edema with trace ascites. As above, suspected enlarging extraluminal fluid and air collection along the medial aspect of the ascending colon consistent with a contained perforation. This appears separate from the appendix. No other organized fluid collections are identified. No pneumoperitoneum. Fluid extends into the left inguinal canal. 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 Lower lumbar spondylosis noted. No evidence of acute fracture or discitis. IMPRESSION: 1. Persistent heterogeneous wall thickening of the right colon with surrounding inflammation and suspected enlarging contained perforation along the medial aspect of the ascending colon. As before, this may be secondary to diverticulitis, colitis or neoplasm. No organized fluid collection or pneumoperitoneum. General surgical consultation recommended. 2. Interval increased gastric distension with diffuse small and large bowel distension, most consistent with an ileus. 3. The appendix appears normal. 4. Mild generalized subcutaneous edema with trace ascites. 5. Small bilateral pleural effusions with dependent consolidation in both lower lobes, possibly atelectasis although cannot exclude aspiration. 6. 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 7. Aortic atherosclerosis. 8. These results will be called to the ordering  clinician or representative by the Radiologist Assistant, and communication documented in the PACS or [employer]. Electronically Signed   By: Carey Bullocks M.D.   On: 01/14/2024 16:36    Assessment / Plan:  63 year old male with right mid abdominal/RLQ pain, hematochezia and severe sepsis. Temp 102.25F on 4/13. CTAP showed inflammatory process involving the proximal ascending colon and terminal ileum with eccentric wall thickening, multiple diverticula in the ascending colon without abscess and scattered left-sided diverticulosis without adjacent inflammatory change concerning for diverticulitis versus colitis or neoplasm. Lactic acid level 8.3 -> 2.7 -> 1.2. WBC 11.7 -> 16.1 -> 21.5 -> 15.1 -> today WBC 14.8. Abdominal pain decreased, abdomen notably more distended on yesterday, repeat CTAP showed persistent thickening to the right colon with surrounding inflammation and enlarging contained perforation to the ascending colon with increased gastric, small bowel and large bowel distention concerning for an ileus. Seen by general surgery, planning on colon resection with possible colostomy today. Remains on Zosyn IV.  Afebrile.  Hemodynamically stable. -High risk colonoscopy deferred -NPO -Patient to proceed expiratory laparotomy with bowel resection and possible colostomy today with Dr. Gaynelle Adu -Pain management per the hospitalist   Acute on chronic iron deficiency anemia.  Admission Hg 8.0 -> 6.7 -> transfused 2 units of PRBCs 4/13 -> Hg 8.0 -> Hg 6.5 on 4/14 -> transfused one unit of PRBCs -> Hg 6.1 -> transfused 2 additional units of PRBCs -> Hg 7.7 -> today Hg 8.4. Iron 11.  Ferritin 19.  Received IV iron 4/14. Last bloody BM was 3 am on 4/14.  Never had an EGD or screening colonoscopy.  - Continue to hold anticoagulation -Transfuse for hemoglobin less than 7 and as needed if symptomatic - EGD and colonoscopy when medically appropriate - Await further recommendations per Dr. 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 Coumadin on hold,  last dose was Friday 4/11 at 4 PM   GERD - Pantoprazole 40 mg IV QD   Left femoral head AVN per CT - Management per the medical service    Active Problems:   Essential hypertension, benign   Hyperlipidemia   History of pulmonary embolism   Diverticulitis   AKI (acute kidney injury) (HCC)   Iron deficiency anemia   ABLA (acute blood loss anemia)   Hematochezia   Acute blood loss anemia   Rectal bleeding     LOS: 4 days   Tory Freiberg  01/15/2024, 9:17 AM

## 2024-01-15 NOTE — Transfer of Care (Signed)
 Immediate Anesthesia Transfer of Care Note  Patient: Alexander [name]  Procedure(s) Performed: LAPAROTOMY, EXPLORATORY  Patient Location: PACU and ICU  Anesthesia Type:General  Level of Consciousness: Patient remains intubated per anesthesia plan  Airway & Oxygen Therapy: Patient remains intubated per anesthesia plan  Post-op Assessment: Report given to RN and Post -op Vital signs reviewed and stable  Post vital signs: Reviewed and stable  Last Vitals:  Vitals Value Taken Time  BP    Temp    Pulse    Resp    SpO2      Last Pain:  Vitals:   01/15/24 0926  TempSrc: Oral  PainSc:       Patients Stated Pain Goal: 4 (01/12/24 2355)  Complications: No notable events documented.

## 2024-01-15 NOTE — Consult Note (Addendum)
 NAME:  Alexander [name], MRN:  [id number], DOB:  10-13-1960, LOS: 4 ADMISSION DATE:  01/11/2024, CONSULTATION DATE:  01/15/24 REFERRING MD:  Andrey Campanile CHIEF COMPLAINT:  Vent Management   History of Present Illness:  Pt is encephelopathic; therefore, this HPI is obtained from chart review. Alexander [name] is a 63 y.o. male who has a PMH as outlined below including a limited to PE/DVT on Coumadin.  He was admitted to [facility] on 01/11/2024 with rectal bleeding and hypotension.  Symptoms had started 1 day earlier and had persisted.  CT in the ED was concerning for diverticulitis versus colitis versus neoplasm.  He was started on Zosyn and his Coumadin was held.   He was evaluated by gastroenterology who recommended antibiotic therapy and conservative management in the interim with possible EGD/colonoscopy at a later date (possibly 4 to 6 weeks after completion of 10 days of antibiotics alone as he did not become transfusion dependent).  Unfortunately after his admission, he required multiple transfusions for persistent anemia and bleeding.  He had repeat CT scan on 4/15 that demonstrated ongoing inflammation and a large contained perforation without abscess and pneumoperitoneum, increased gaseous distention of small and large bowel distention consistent with ileus.  He was evaluated by general surgery who initially opted for conservative management.  Unfortunately he had ongoing abdominal pain and unfortunately 16, decision was made to go to the OR for further evaluation management.  He was taken to the OR and had an exploratory laparotomy where he was found to have a perforated ascending colon with intra-abdominal abscess and ultimately underwent with right colectomy without anastomosis, placement of wound VAC, and drainage of intra-abdominal abscesses.  He was left open and plan was for return trip to the OR on Friday/18.  Return to the ICU on the ventilator, and PCCM was asked to assist with ventilator and medical  management.  Pertinent  Medical History:  has Elevated uric acid in blood; Chronic GERD; Chronic foot pain, right; Chronic pain of right ankle; Acute pulmonary embolism (HCC); DVT (deep venous thrombosis) (HCC); Essential hypertension, benign; Gout; Pulmonary embolus (HCC); Hepatic steatosis; Spinal stenosis; Aortic atherosclerosis (HCC); Peripheral arterial disease (HCC); Sleep disturbance; Diverticulosis; High risk medication use; S/P surgical manipulation of ankle joint; Hyperlipidemia; Noncompliance; Impaired fasting blood sugar; Chronic diarrhea; Pulmonary hypertension (HCC); Cor pulmonale (HCC); Chronic thromboembolic disease (HCC); Thrombocytopenia (HCC); History of pulmonary embolism; Diverticulitis; Symptomatic anemia; AKI (acute kidney injury) (HCC); Lower GI bleed; Iron deficiency anemia; Diverticulitis of colon; ABLA (acute blood loss anemia); Hematochezia; Acute blood loss anemia; Rectal bleeding; Generalized abdominal pain; and Colon perforation (HCC) on their problem list.  Significant Hospital Events: Including procedures, antibiotic start and stop dates in addition to other pertinent events   4/14 admit 4/16 to OR for ex lap, returned to ICU on vent 4/18 return trip to OR Planned  Interim History / Subjective:  Sedated on Propofol.  Objective:  Blood pressure (!) 151/92, pulse 94, temperature 97.8 F (36.6 C), temperature source Oral, resp. rate 19, height 5\' 9"  (1.753 m), weight 80.7 kg, SpO2 98%.    Vent Mode: PRVC FiO2 (%):  [100 %] 100 % Set Rate:  [16 bmp] 16 bmp Vt Set:  [560 mL] 560 mL PEEP:  [5 cmH20] 5 cmH20 Plateau Pressure:  [20 cmH20] 20 cmH20   Intake/Output Summary (Last 24 hours) at 01/15/2024 1519 Last data filed at 01/15/2024 1514 Gross per 24 hour  Intake 2930.12 ml  Output 960 ml  Net 1970.12 ml   [employer]  01/11/24 1435 01/15/24 0922  Weight: 80.7 kg 80.7 kg    Examination: General: Adult male, resting in bed, in NAD. Neuro: Sedated, not  responsive. HEENT: [place]/AT. Sclerae anicteric. ETT in place. Cardiovascular: Tachy, regular, no M/R/G.  Lungs: Respirations even and unlabored.  CTA bilaterally, No W/R/R. Abdomen: BS hypoactive. Midline incision with wound vac in place, abd open and soft. Musculoskeletal: No gross deformities, no edema.  Skin: Intact, warm, no rashes.  Assessment & Plan:   Perforated ascending colon with intra abdominal abscess - s/p ex lap 4/16 with right colectomy without anastomosis, placement of wound VAC, and drainage of intra-abdominal abscesses. Ileus. - Post op care per CCS. - Continue abx. - Rectal decompression per CCS. - Return trip to OR planned for 4/18 for washout, probable end ileostomy, closure.  Sepsis - 2/2 above. - Continue Zosyn. - Follow cultures. - Continue fluids.  GIB - 2/2 #1. Acute blood loss, iron deficiency, B12 deficiency anemia - s/p 5u PRBC, iron supplementation. - GI and CCS following. - Transfuse for Hgb < 7. - Continue B12 supplementation. - Continue PPI. - Holding AC.  Acute hypoxic respiratory failure - 2/2 #1. - Full vent support. - Holding extubation as return trip to OR planned for 4/18. - Bronchial hygiene. - CXR intermittently.  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 - Holding therapeutic AC for now.  Left femoral head AVN. - No acute interventions required.   Best practice (evaluated daily):  Diet/type: NPO DVT prophylaxis: SCD Pressure ulcer(s): pressure ulcer assessment deferred  GI prophylaxis: PPI Lines: Central line Foley:  Yes, and it is still needed Code Status:  full code Last date of multidisciplinary goals of care discussion: None yet.  Labs   CBC: Recent Labs  Lab 01/11/24 1458 01/11/24 1957 01/12/24 0056 01/12/24 1552 01/13/24 7829 01/13/24 1624 01/14/24 0557 01/14/24 1820 01/15/24 0254 01/15/24 0940  WBC 18.3* 14.7*   < > 16.1* 21.5* 15.1* 14.8*  --  18.6*  --   NEUTROABS 16.1* 12.4*  --   --  19.7*  --  12.1*  --   15.4*  --   HGB 8.0* 6.7*   < > 8.0* 6.5* 6.1* 7.7* 8.6* 8.4* 8.6*  HCT 28.6* 23.6*   < > 26.8* 21.7* 19.8* 24.3* 28.1* 26.7* 27.6*  MCV 76.1* 76.6*   < > 78.4* 80.4 78.9* 80.2  --  80.7  --   PLT 393 338   < > 263 239 220 234  --  320  --    < > = values in this interval not displayed.    Basic Metabolic Panel: Recent Labs  Lab 01/11/24 1957 01/12/24 0056 01/12/24 1044 01/13/24 0450 01/14/24 0557 01/15/24 0254  NA 134* 136  --  136 137 139  K 3.6 3.9  --  4.1 3.9 3.5  CL 106 106  --  105 107 102  CO2 20* 21*  --  15* 23 26  GLUCOSE 129* 104*  --  171* 100* 115*  BUN 17 17  --  26* 24* 24*  CREATININE 1.08 1.19  --  1.92* 1.09 0.97  CALCIUM 7.7* 8.1*  --  7.6* 8.1* 8.4*  MG 2.1 1.7  --  2.0 2.2 2.4  PHOS 2.7  --  2.6  --   --   --    GFR: Estimated Creatinine Clearance: 80 mL/min (by C-G formula based on SCr of 0.97 mg/dL). Recent Labs  Lab 01/11/24 1957 01/12/24 0056 01/12/24 1044 01/12/24 1552 01/13/24 0450  01/13/24 1610 01/13/24 0857 01/13/24 1234 01/13/24 1624 01/14/24 0557 01/15/24 0254  PROCALCITON 0.94  --   --   --   --   --   --   --   --   --   --   WBC 14.7*   < >  --    < >  --  21.5*  --   --  15.1* 14.8* 18.6*  LATICACIDVEN  --    < > 2.2*  --  8.3*  --  2.7* 1.2  --   --   --    < > = values in this interval not displayed.    Liver Function Tests: Recent Labs  Lab 01/11/24 1458 01/12/24 0056  AST 27 16  ALT 8 7  ALKPHOS 56 51  BILITOT 1.4* 0.7  PROT 7.7 7.0  ALBUMIN 3.3* 3.1*   No results for input(s): "LIPASE", "AMYLASE" in the last 168 hours. No results for input(s): "AMMONIA" in the last 168 hours.  ABG No results found for: "PHART", "PCO2ART", "PO2ART", "HCO3", "TCO2", "ACIDBASEDEF", "O2SAT"   Coagulation Profile: Recent Labs  Lab 01/11/24 1458 01/12/24 1044  INR 2.2* 2.2*    Cardiac Enzymes: Recent Labs  Lab 01/11/24 1957  CKTOTAL 87    HbA1C: Hgb A1c MFr Bld  Date/Time Value Ref Range Status  12/03/2023 02:37 PM  5.9 4.6 - 6.5 % Final    Comment:    Glycemic Control Guidelines for People with Diabetes:Non Diabetic:  <6%Goal of Therapy: <7%Additional Action Suggested:  >8%   01/22/2020 11:37 AM 5.2 4.8 - 5.6 % Final    Comment:             Prediabetes: 5.7 - 6.4          Diabetes: >6.4          Glycemic control for adults with diabetes: <7.0     CBG: Recent Labs  Lab 01/13/24 0204  GLUCAP 148*    Review of Systems:   Unable to obtain as pt is encephalopathic.  Past Medical History:  He,  has a past medical history of Allergy, Aortic atherosclerosis (HCC) (09/2019), Arthritis, Chronic thromboembolic disease (HCC) (07/13/2023), Diverticulitis (2012), DVT (deep venous thrombosis) (HCC) (2019), DVT (deep venous thrombosis) (HCC) (09/2019), Gout (2007), History of gastroesophageal reflux (GERD), Hypertension (10/2019), Overweight (BMI 25.0-29.9), PAD (peripheral artery disease) (HCC) (09/2019), PE (pulmonary thromboembolism) (HCC) (09/2019), Pulmonary embolism (HCC) (2019), and Pulmonary hypertension (HCC) (07/12/2023).   Surgical History:   Past Surgical History:  Procedure Laterality Date   FOOT ARTHROPLASTY Right    HEMORRHOID SURGERY     IR ANGIOGRAM PULMONARY BILATERAL SELECTIVE  07/12/2023   IR ANGIOGRAM SELECTIVE EACH ADDITIONAL VESSEL  07/12/2023   IR ANGIOGRAM SELECTIVE EACH ADDITIONAL VESSEL  07/12/2023   IR INFUSION THROMBOL ARTERIAL INITIAL (MS)  07/12/2023   IR INFUSION THROMBOL ARTERIAL INITIAL (MS)  07/12/2023   IR THROMB F/U EVAL ART/VEN FINAL DAY (MS)  07/13/2023   IR US GUIDE VASC ACCESS RIGHT  07/12/2023     Social History:   reports that he has quit smoking. His smoking use included cigarettes. He has never used smokeless tobacco. He reports current alcohol use of about 3.0 standard drinks of alcohol per week. He reports that he does not use drugs.   Family History:  His family history includes Asthma in his sister; Cancer in his father; Diabetes in his brother,  brother, maternal aunt, and paternal aunt; Gout in his mother; Heart disease in his  mother; Hypertension in his brother, brother, brother, father, mother, sister, and sister; Stroke in his mother. There is no history of Colon cancer or Prostate cancer.   Allergies No Known Allergies   Home Medications  Prior to Admission medications   Medication Sig Start Date End Date Taking? Authorizing Provider  acetaminophen (TYLENOL) 325 MG tablet Take 2 tablets (650 mg total) by mouth every 6 (six) hours as needed for mild pain (pain score 1-3) (or Fever >/= 101). 07/23/23  Yes Sheikh, Omair Latif, DO  warfarin (COUMADIN) 5 MG tablet TAKE 2 TABLETS BY MOUTH DAILY OR AS DIRECTED BY ANTICOAGULATION CLINIC Patient taking differently: Take 10 mg by mouth daily at 4 PM. 12/24/23  Yes Alexander Iba, PA  mupirocin ointment (BACTROBAN) 2 % Apply to affected area 1-2 times daily Patient not taking: Reported on 01/11/2024 12/03/23   Alexander Iba, PA  polyethylene glycol powder (GLYCOLAX/MIRALAX) 17 GM/SCOOP powder Mix as directed and take 17 g by mouth daily. Patient not taking: Reported on 01/11/2024 07/23/23   Eveline Hipps, DO     Critical care time: 35 min.   Rafael Bun, PA - C [facility] For pager details, please see AMION or use Epic chat  After 1900, please call [facility] for cross coverage needs 01/15/2024, 3:19 PM

## 2024-01-15 NOTE — Anesthesia Procedure Notes (Addendum)
 Central Venous Catheter Insertion Performed by: [name], MD, anesthesiologist Start/End4/16/2025 11:03 AM, 01/15/2024 11:17 AM Patient location: OR. Preanesthetic checklist: patient identified, IV checked, site marked, risks and benefits discussed, surgical consent and timeout performed Position: Trendelenburg Catheter size: 8 Fr Total catheter length 16. Procedure performed using ultrasound guided technique. Ultrasound Notes:image(s) printed for medical record Attempts: 1 Following insertion, line sutured, Biopatch and dressing applied. Post procedure assessment: free fluid flow and blood return through all ports  Patient tolerated the procedure well with no immediate complications.

## 2024-01-15 NOTE — Progress Notes (Incomplete Revision)
 CODE BLUE called at 2012, ROSC at 2016. Patient received 1mg  of Epinephrine at 2016, 2g total of Calcium Chloride at 2016 and 2017. Blood administration began emergently at 2030. Emergent blood completed at 2100  2nd Unit started at 2110 2nd Unit Completed at 2250

## 2024-01-15 NOTE — Progress Notes (Addendum)
 Patient ID: Alexander [name], male   DOB: 1961/09/13, 63 y.o.   MRN: [id number]   Acute Care Surgery Service Progress Note:    Chief Complaint/Subjective: Still with abd pain No events overnight  Objective: Vital signs in last 24 hours: Temp:  [97.7 F (36.5 C)-99.1 F (37.3 C)] 97.7 F (36.5 C) (04/16 0758) Pulse Rate:  [95-111] 99 (04/16 0700) Resp:  [15-28] 20 (04/16 0700) BP: (113-143)/(63-97) 118/87 (04/16 0200) SpO2:  [91 %-99 %] 92 % (04/16 0700) Last BM Date : 01/13/24  Intake/Output from previous day: 04/15 0701 - 04/16 0700 In: 1610.1 [P.O.:1470; IV Piggyback:140.1] Out: 850 [Urine:850] Intake/Output this shift: No intake/output data recorded.  Lungs: nonlabored  Cardiovascular: reg  Abd: distended full, mild to mod TTP on right   Extremities: no edema, +SCDs  Neuro: alert, nonfocal  Lab Results: CBC  Recent Labs    01/14/24 0557 01/14/24 1820 01/15/24 0254  WBC 14.8*  --  18.6*  HGB 7.7* 8.6* 8.4*  HCT 24.3* 28.1* 26.7*  PLT 234  --  320   BMET Recent Labs    01/14/24 0557 01/15/24 0254  NA 137 139  K 3.9 3.5  CL 107 102  CO2 23 26  GLUCOSE 100* 115*  BUN 24* 24*  CREATININE 1.09 0.97  CALCIUM 8.1* 8.4*   LFT    Latest Ref Rng & Units 01/12/2024   12:56 AM 01/11/2024    2:58 PM 12/04/2023    3:31 PM  Hepatic Function  Total Protein 6.5 - 8.1 g/dL 7.0  7.7  7.6   Albumin 3.5 - 5.0 g/dL 3.1  3.3  3.8   AST 15 - 41 U/L 16  27  14    ALT 0 - 44 U/L 7  8  8    Alk Phosphatase 38 - 126 U/L 51  56  64   Total Bilirubin 0.0 - 1.2 mg/dL 0.7  1.4  0.5    PT/INR Recent Labs    01/12/24 1044  LABPROT 24.6*  INR 2.2*   ABG No results for input(s): "PHART", "HCO3" in the last 72 hours.  Invalid input(s): "PCO2", "PO2"  Studies/Results:  Anti-infectives: Anti-infectives (From admission, onward)    Start     Dose/Rate Route Frequency Ordered Stop   01/12/24 0200  piperacillin-tazobactam (ZOSYN) IVPB 3.375 g        3.375 g 12.5 mL/hr  over 240 Minutes Intravenous Every 8 hours 01/11/24 2120     01/11/24 1815  piperacillin-tazobactam (ZOSYN) IVPB 3.375 g        3.375 g 100 mL/hr over 30 Minutes Intravenous  Once 01/11/24 1806 01/11/24 1921       Medications: Scheduled Meds:  sodium chloride   Intravenous Once   acetaminophen  1,000 mg Oral Once   Chlorhexidine Gluconate Cloth  6 each Topical Daily   mupirocin ointment  1 Application Nasal BID   Continuous Infusions:  piperacillin-tazobactam (ZOSYN)  IV Stopped (01/15/24 0546)   PRN Meds:.acetaminophen **OR** acetaminophen, alum & mag hydroxide-simeth, fentaNYL (SUBLIMAZE) injection, hydrALAZINE, HYDROcodone-acetaminophen, labetalol, Muscle Rub, ondansetron **OR** ondansetron (ZOFRAN) IV, simethicone  Assessment/Plan: Patient Active Problem List   Diagnosis Date Noted   Hematochezia 01/14/2024   Acute blood loss anemia 01/14/2024   Rectal bleeding 01/14/2024   ABLA (acute blood loss anemia) 01/13/2024   Iron deficiency anemia 01/12/2024   Diverticulitis of colon 01/12/2024   History of pulmonary embolism 01/11/2024   Diverticulitis 01/11/2024   Symptomatic anemia 01/11/2024   AKI (acute kidney injury) (HCC)  01/11/2024   Lower GI bleed 01/11/2024   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   Thrombocytopenia (HCC) 07/13/2023   Pulmonary hypertension (HCC) 07/12/2023   Cor pulmonale (HCC) 07/12/2023   Noncompliance 01/22/2020   Impaired fasting blood sugar 01/22/2020   Chronic diarrhea 01/22/2020   High risk medication use 10/30/2019   S/P surgical manipulation of ankle joint 10/30/2019   Hyperlipidemia 10/30/2019   Hepatic steatosis 10/08/2019   Spinal stenosis 10/08/2019   Aortic atherosclerosis (HCC) 10/08/2019   Peripheral arterial disease (HCC) 10/08/2019   Sleep disturbance 10/08/2019   Diverticulosis 10/08/2019   Pulmonary embolus (HCC) 09/29/2019   Acute pulmonary embolism (HCC) 09/28/2019   DVT (deep venous thrombosis) (HCC)  09/28/2019   Essential hypertension, benign 09/28/2019   Gout 09/28/2019   Chronic pain of right ankle 12/12/2016   Chronic foot pain, right 11/02/2012   Elevated uric acid in blood 09/21/2011   Chronic GERD 09/21/2011   Inflammation of prox ascending colon, possible diverticulitis, possible neoplasm Contained perforation without abscess or pneumoperitoneum Rectal bleeding    CT 4/12 with Inflammatory process involving the proximal ascending colon including terminal ileum, with eccentric wall thickening. Differential considerations include diverticulitis, colitis, and neoplasm.    Repeat CT yesterday with ongoing inflammation and enlarging contained perforation without abscess or pneumoperitoneum.  Increased gastric distention and small and large bowel distention consistent with ileus.   I do not think this condition can be managed with antibiotics and I do not think a percutaneous drain is indicated since it is the right colon, worsening pain, progression of abscess.  I think the best treatment course is colonic resection and probable ileostomy.  His prealbumin is 7 his albumin is around 3 when it was checked a few days ago.  So I think some of it will depend on intraoperative findings of whether or not he gets an anastomosis.  I discussed the procedure in detail.    We discussed the risks and benefits of surgery including, but not limited to bleeding, infection (such as wound infection, abdominal abscess), injury to surrounding structures, blood clot formation, urinary retention, incisional hernia, anastomotic stricture, anastomotic leak, anesthesia risks, pulmonary & cardiac complications such as pneumonia &/or heart attack, need for additional procedures, ileus, & prolonged hospitalization, we discussed potential ileostomy issues such as hydration and skin issues..  We discussed the typical postoperative recovery course, including limitations & restrictions postoperatively. I explained that  the likelihood of improvement in their symptoms is good.  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 Held. 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 Elvan Hamel, MD, FACS General, Bariatric, & Minimally Invasive Surgery [phone] [facility], [facility]

## 2024-01-15 NOTE — Progress Notes (Addendum)
 eLink Physician-Brief Progress Note Patient Name: Alexander [name] DOB: 1960-12-24 MRN: [id number]   Date of Service  01/15/2024  HPI/Events of Note  Is Post Colectomy for Intestinal Perforation, Bowel Discontinuity with an Open Abdomen, Plan to Go Back to the OR in a Couple of Days.  Progressive shock throughout the day with escalating vasopressor requirements now on 30 of norepinephrine.  Attempted albumin bolus and blood bolus earlier in the day with roughly 1.1 L out from the abdominal drains and 150 of EBL IntraOp.  eICU Interventions  Given elevated norepinephrine requirements, will initiate vasopressin, stress dose steroids, and attempt repeat albumin bolus.  H&H pending at 9 PM.   2008 -notified that patient was rapidly decompensating despite maximal norepinephrine and vasopressin.  Stat labs are still pending.  Notified surgical team that copious amounts of blood like output from abdominal drains despite change of abdominal drainage canister roughly 30 minutes prior.  Will assess for coagulopathy and address this prior to any potential surgical intervention.  2012 -patient continued to decline until pulses were lost.  Had a 4-minute cardiac arrest with 2 rounds of epinephrine and subsequently achieved ROSC.  Calcium 4 g administered.  2 units of emergent PRBC ordered.  Found to have wide-complex tachycardia post ROSC and additional 150 mg of amiodarone were administered.  Post ROSC EKG, stat labs ordered.  Again notified surgical team of decompensation -same plan as prior given his clinical decompensation, optimize from a hematological standpoint prior to any potential intervention.  Hemoglobin 7.5 on i-STAT.  iCal 1.49.  Potassium 4.2 and bicarb 22.  Currently vaso 0.04, nor epi 45, epi 0.5.  2133 -lactic acid 4.4.  Hemoglobin 7.2 prior to blood being administered.  Remaining coagulopathy labs pending.  Notified patient's son about current progression and lack of surgical options.  Patient's son  was aware that the patient was septic but did not know that the patient had been in the cell.  Patient's son and sister are planning on arriving.  2146 -2 units of FFP ordered.  DC prophylactic Lovenox.  Intervention Category Intermediate Interventions: Hypotension - evaluation and management  [name] 01/15/2024, 7:24 PM

## 2024-01-15 NOTE — Anesthesia Preprocedure Evaluation (Addendum)
 Anesthesia Evaluation  Patient identified by MRN, date of birth, ID band Patient awake    Reviewed: Allergy & Precautions, NPO status , Patient's Chart, lab work & pertinent test results  Airway Mallampati: II  TM Distance: >3 FB Neck ROM: Full    Dental no notable dental hx. (+) Teeth Intact, Dental Advisory Given   Pulmonary former smoker   Pulmonary exam normal breath sounds clear to auscultation       Cardiovascular hypertension, Pt. on medications (-) angina (-) Past MI Normal cardiovascular exam Rhythm:Regular Rate:Normal  07/2023 Echo 1. Left ventricular ejection fraction, by estimation, is 50 to 55%. The  left ventricle has low normal function. The left ventricle has no regional  wall motion abnormalities. Left ventricular diastolic parameters are  consistent with Grade I diastolic  dysfunction (impaired relaxation). There is the interventricular septum is  flattened in diastole ('D' shaped left ventricle), consistent with right  ventricular volume overload.   2. Right ventricular systolic function is mildly reduced. The right  ventricular size is normal. There is severely elevated pulmonary artery  systolic pressure.   3. The mitral valve is normal in structure. No evidence of mitral valve  regurgitation. No evidence of mitral stenosis.   4. The aortic valve is tricuspid. Aortic valve regurgitation is trivial.  No aortic stenosis is present.   5. The inferior vena cava is dilated in size with >50% respiratory  variability, suggesting right atrial pressure of 8 mmHg.     Neuro/Psych negative neurological ROS  negative psych ROS   GI/Hepatic ,GERD  Medicated and Controlled,,  Endo/Other    Renal/GU Renal diseaseLab Results      Component                Value               Date                      NA                       139                 01/15/2024                CL                       102                  01/15/2024                K                        3.5                 01/15/2024                CO2                      26                  01/15/2024                BUN                      24 (H)  01/15/2024                CREATININE               0.97                01/15/2024                GFRNONAA                 >60                 01/15/2024                CALCIUM                  8.4 (L)             01/15/2024                PHOS                     2.6                 01/12/2024                ALBUMIN                  3.1 (L)             01/12/2024                GLUCOSE                  115 (H)             01/15/2024                Musculoskeletal  (+) Arthritis ,    Abdominal   Peds  Hematology  (+) Blood dyscrasia, anemia Lab Results      Component                Value               Date                      WBC                      18.6 (H)            01/15/2024                HGB                      8.4 (L)             01/15/2024                HCT                      26.7 (L)            01/15/2024                MCV                      80.7                01/15/2024                PLT  320                 01/15/2024            T X C for 2 Units   Anesthesia Other Findings   Reproductive/Obstetrics                             Anesthesia Physical Anesthesia Plan  ASA: 3 and emergent  Anesthesia Plan: General   Post-op Pain Management: Ketamine IV*, Toradol IV (intra-op)* and Ofirmev IV (intra-op)*   Induction: Intravenous, Cricoid pressure planned and Rapid sequence  PONV Risk Score and Plan: Treatment may vary due to age or medical condition, Ondansetron and Midazolam  Airway Management Planned: Oral ETT  Additional Equipment: CVP and Arterial line  Intra-op Plan:   Post-operative Plan: Post-operative intubation/ventilation  Informed Consent: I have reviewed the patients History and  Physical, chart, labs and discussed the procedure including the risks, benefits and alternatives for the proposed anesthesia with the patient or authorized representative who has indicated his/her understanding and acceptance.     Dental advisory given  Plan Discussed with: CRNA, Surgeon and Anesthesiologist  Anesthesia Plan Comments: (Sepsis hematochezia and RLQ pain)        Anesthesia Quick Evaluation

## 2024-01-15 NOTE — Progress Notes (Signed)
 Currently intubated with open abdomen.  Now on critical care services.  Will sign off.  Let us  know when we can be of further assistance.  Donnetta Gains, MD

## 2024-01-15 NOTE — Progress Notes (Signed)
 CODE BLUE called at 2012, ROSC at 2016. Patient received 1mg  of Epinephrine at 2016, 2g total of Calcium Chloride at 2016 and 2017. Blood administration began emergently at 2030. Emergent blood completed at 2100  2nd Unit started at 2110 2nd Unit Completed at 2250  1st Unit of FFP started 2305 1st Unit FFP Completed 2314  2nd Unit of FFP started at  2315 2nd Unit of FFP Completed at 2327  Patient began experiencing hypotension in the 70s-80s @ 0258. Elink contacted and Dr. Paliwal in the room to give new orders.  1 Amp of Sodium Bicarbonate given @0314  Sodium Bicarbonate gtt began @ 167mL/hr @0317   Epinephrine gtt restarted @0319  NS 250mL bolus began @0323  at 973mL/hr  Sodium Bicarbonate gtt rate changed to 262mL/hr per Paliwal, MD @0425Dextrose  5% & 0.45% KCL 20mEq/L gtt D/C @ 0426 1 Amp of Sodium Bicarbonate given @ 0430 1L Bolus of LR @0446   2 Amps of Sodium Bicarbonate given @ 0631 Albumin 25g began @ 0631
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
## 2024-01-15 NOTE — Op Note (Signed)
 01/15/2024  1:52 PM  PATIENT:  Alexander [name]  63 y.o. male  PRE-OPERATIVE DIAGNOSIS: Ascending colon INFLAMMATION WITH PERFORATION; hematochezia  POST-OPERATIVE DIAGNOSIS: Same, intra-abdominal abscess  PROCEDURE:  Procedure(s): LAPAROTOMY, EXPLORATORY RIGHT COLECTOMY (without anastomosis) PLACEMENT OF ABTHERA WOUND VAC DRAINAGE OF INTRA-ABDOMINAL ABSCESS  SURGEON:  Surgeon(s):  Gaynelle Adu, MD  ASSISTANTS: Stechschulte, Hyman Hopes, MD (qualified assistant was requested during the course of the procedure to help with retraction of viscera.  Patient had severe dilation of his entire small bowel including his entire colon making it challenging to do the procedure without a second surgeon)  ANESTHESIA:   general  DRAINS: Nasogastric Tube, Urinary Catheter (Foley), and abthera    LOCAL MEDICATIONS USED:  NONE  SPECIMEN:  Source of Specimen:  right colon with TI (stitch on perforation); additional prox transverse colon -double suture proximal  DISPOSITION OF SPECIMEN:  PATHOLOGY  COUNTS:  YES  INDICATION FOR PROCEDURE: 63 year old male who been admitted with right mid abdominal and right lower quadrant pain, hematochezia and sepsis.  CT scan showed inflammatory process involving the proximal ascending colon and terminal ileum with wall thickening and multiple diverticuli.  He was started on antibiotics and fluid resuscitation.  GI medicine was consulted.  They were initially planning to do a colonoscopy however repeat imaging showed worsening contained perforation involving the right colon and we were called.  Because of the worsening perforation and the patient having persistent pain as well has leukocytosis I recommended laparotomy with right colectomy and probable ileostomy.  Explained the rationale for not doing a bowel anastomosis.  Risk and benefits were discussed and separately documented  PROCEDURE: Patient underwent placement of an arterial line in preop.  He was then taken to the  OR for at [facility] and placed supine on the operating room table.  General endotracheal anesthesia was established.  After RSI intubation and a secure airway the patient did vomit.  Anesthesia placed an OG tube.  His abdomen was prepped and draped in the usual surgical fashion with ChloraPrep.  The patient was on scheduled therapeutic antibiotics.  The patient had significant abdominal distention.  Surgical timeout was performed.  Sequential pression devices had been placed along with a Foley catheter.  Initially a mini midline incision was made but upon entering the abdomen the small bowel was grossly dilated and distended including a segment of what appeared to be the sigmoid colon was massively distended.  I ended up extending the incision and placed a wound protector but again due to the severe dilation of the entire small bowel as well as the entire colon I ended up having to extend the incision much larger and removing the wound protector because of the need to place a Bookwalter retractor.  I identified the area of perforation in the proximal ascending colon.  There was about a 2 inch hole in the cecum.  There was an intra-abdominal abscess of pus and stool which was drained.  Coming out of the perforation in the cecum was just ongoing persistent bloody enteric contents.  I tried to oversew the defect to prevent ongoing contamination but the tissue was very friable and would not hold a stitch.  At this time I decided to go ahead and divide the terminal ileum a small window was made in the mesentery with a Tresa Endo.  I then divided the terminal ileum about 15 to 20 cm from the ileocecal valve with a GIA stapler with a blue load.  Took down the mesentery with  LigaSure.  Appendix was identified and its attachments to the pelvic inlet were taken down with a combination of blunt dissection, Bovie electrocautery and LigaSure device.  At this time my assistant joining the operating room.  I took down to  mobilize some of the right colon bluntly.  We ended up taking down the lateral attachments of the ascending colon with combination of blunt dissection, Bovie electrocautery and LigaSure device.  Hepatic flexure was taken down in sequential fashion as well.  We identified the duodenum.  It was protected and preserved.  Where we will to lift the terminal ileum and cecum up out of the lower abdomen.  We then started taking down the rest of the right colon mesentery sequentially with LigaSure.  Ended up placing a 2-0 silk ligature on the ileocolonic pedicle and tying that off.  We identified an area of mid transverse colon at the middle colic vessels.  Again the patient had significant small bowel distention as well as colonic distention.  We decided that we would evacuate some of the enteric contents in order to help decompress his intestinal tract.  The stapled end of the terminal ileum was opened up and the small bowel contents were milked out into a large specimen bucket.  We milked the entire small bowel probably about 4 times and got a large amount of old blood and enteric contents out of the small bowel.  We then made a fresh staple line on the ileum.  We were not really able to evacuate the colon at all.  We divided the transverse colon just proximal to the right branch of the middle colic vessel with a GIA stapler with a 75 mm blue load.  Because of the significant intra-abdominal contamination from his perforation we felt that we should probably bring him back another day and wash him out and create a end ileostomy and a probable mucous fistula at that time as opposed to today.  I then irrigated the abdomen with 6 L of saline.  I looked for any signs of bleeding and there were none.  Should be noted that while the small bowel was much less dilated after to been decompressed the sigmoid colon was still very massively distended.  Hopeful that it will decompress through the rectum post procedure.  An ABThera  wound VAC was applied in typical fashion and connected to the wound VAC machine with 125 mm of continuous suction.  There is no air leak.  All needle, instrument, and sponge counts were correct x 2.  Patient was transferred directly to the ICU intubated.  CASE DATA: Type of patient?: LDOW CASE (Surgical Hospitalist [facility] Inpatient) Status of Case? URGENT Add On Infection Present At Time Of Surgery (PATOS)?   Abscess,    PLAN OF CARE:  already inpt  PATIENT DISPOSITION:  ICU - intubated and hemodynamically stable.   Delay start of Pharmacological VTE agent (>24hrs) due to surgical blood loss or risk of bleeding:  no  Marianna Shirk. Elvan Hamel, MD, FACS General, Bariatric, & Minimally Invasive Surgery [facility], [name]

## 2024-01-15 NOTE — Progress Notes (Signed)
 Responded to CODE BLUE. Pt has central access as well as PIV access.

## 2024-01-15 NOTE — Anesthesia Procedure Notes (Signed)
 Procedure Name: Intubation Date/Time: 01/15/2024 11:00 AM  Performed by: Darlena Ego, CRNAPre-anesthesia Checklist: Patient identified, Emergency Drugs available, Suction available and Patient being monitored Patient Re-evaluated:Patient Re-evaluated prior to induction Oxygen Delivery Method: Circle System Utilized Preoxygenation: Pre-oxygenation with 100% oxygen Induction Type: IV induction and Rapid sequence Ventilation: Mask ventilation without difficulty Laryngoscope Size: Miller and 2 Grade View: Grade I Tube type: Oral Tube size: 7.5 mm Number of attempts: 1 Airway Equipment and Method: Stylet and Oral airway Placement Confirmation: ETT inserted through vocal cords under direct vision, positive ETCO2 and breath sounds checked- equal and bilateral Secured at: 23 cm Tube secured with: Tape Dental Injury: Teeth and Oropharynx as per pre-operative assessment

## 2024-01-15 NOTE — Anesthesia Procedure Notes (Signed)
 Arterial Line Insertion Start/End4/16/2025 9:45 AM, 01/15/2024 9:48 AM Performed by: Micky Albee, CRNA, CRNA  Patient location: Pre-op. Preanesthetic checklist: patient identified, IV checked, site marked, risks and benefits discussed, surgical consent, monitors and equipment checked, pre-op evaluation and timeout performed Left, radial was placed Hand hygiene performed  and maximum sterile barriers used   Attempts: 1 Procedure performed without using ultrasound guided technique. Ultrasound Notes:anatomy identified, needle tip was noted to be adjacent to the nerve/plexus identified and no ultrasound evidence of intravascular and/or intraneural injection Following insertion, dressing applied. Post procedure assessment: normal  Patient tolerated the procedure well with no immediate complications. Additional procedure comments: A line in L radial without difficulty.Alexander [name]

## 2024-01-15 NOTE — Anesthesia Postprocedure Evaluation (Signed)
 Anesthesia Post Note  Patient: Alexander [name]  Procedure(s) Performed: LAPAROTOMY, EXPLORATORY     Patient location during evaluation: SICU Anesthesia Type: General Level of consciousness: sedated Pain management: pain level controlled Vital Signs Assessment: post-procedure vital signs reviewed and stable Respiratory status: patient remains intubated per anesthesia plan Cardiovascular status: stable Postop Assessment: no apparent nausea or vomiting Anesthetic complications: no  No notable events documented.  Last Vitals:  Vitals:   01/15/24 0926 01/15/24 1357  BP: (!) 151/92   Pulse: 94   Resp: 19   Temp: 36.6 C   SpO2: 97% 98%    Last Pain:  Vitals:   01/15/24 0926  TempSrc: Oral  PainSc:                  Alexander [name]

## 2024-01-16 ENCOUNTER — Encounter (HOSPITAL_COMMUNITY)
Admission: EM | Disposition: A | Discharge status: Other Acute Inpatient Hospital | Payer: Self-pay | Source: Home / Self Care | Attending: Internal Medicine

## 2024-01-16 ENCOUNTER — Encounter (HOSPITAL_COMMUNITY): Payer: Self-pay | Admitting: General Surgery

## 2024-01-16 ENCOUNTER — Inpatient Hospital Stay (HOSPITAL_COMMUNITY): Admitting: Anesthesiology

## 2024-01-16 DIAGNOSIS — E785 Hyperlipidemia, unspecified: Secondary | ICD-10-CM | POA: Diagnosis not present

## 2024-01-16 DIAGNOSIS — I469 Cardiac arrest, cause unspecified: Secondary | ICD-10-CM | POA: Diagnosis not present

## 2024-01-16 DIAGNOSIS — A419 Sepsis, unspecified organism: Secondary | ICD-10-CM | POA: Diagnosis not present

## 2024-01-16 DIAGNOSIS — K631 Perforation of intestine (nontraumatic): Secondary | ICD-10-CM

## 2024-01-16 DIAGNOSIS — J9601 Acute respiratory failure with hypoxia: Secondary | ICD-10-CM | POA: Diagnosis not present

## 2024-01-16 DIAGNOSIS — I10 Essential (primary) hypertension: Secondary | ICD-10-CM

## 2024-01-16 HISTORY — PX: LAPAROTOMY: SHX154

## 2024-01-16 LAB — CBC WITH DIFFERENTIAL/PLATELET
Abs Immature Granulocytes: 0.17 10*3/uL — ABNORMAL HIGH (ref 0.00–0.07)
Abs Immature Granulocytes: 0.15 10*3/uL — ABNORMAL HIGH (ref 0.00–0.07)
Basophils Absolute: 0 10*3/uL (ref 0.0–0.1)
Basophils Absolute: 0.1 10*3/uL (ref 0.0–0.1)
Basophils Relative: 0 %
Basophils Relative: 0 %
Eosinophils Absolute: 0 10*3/uL (ref 0.0–0.5)
Eosinophils Absolute: 0 10*3/uL (ref 0.0–0.5)
Eosinophils Relative: 0 %
Eosinophils Relative: 0 %
HCT: 12.7 % — ABNORMAL LOW (ref 39.0–52.0)
HCT: 26.4 % — ABNORMAL LOW (ref 39.0–52.0)
Hemoglobin: 4 g/dL — CL (ref 13.0–17.0)
Hemoglobin: 8.1 g/dL — ABNORMAL LOW (ref 13.0–17.0)
Immature Granulocytes: 1 %
Immature Granulocytes: 1 %
Lymphocytes Relative: 8 %
Lymphocytes Relative: 10 %
Lymphs Abs: 1.6 10*3/uL (ref 0.7–4.0)
Lymphs Abs: 1.5 10*3/uL (ref 0.7–4.0)
MCH: 29 pg (ref 26.0–34.0)
MCH: 28.7 pg (ref 26.0–34.0)
MCHC: 31.5 g/dL (ref 30.0–36.0)
MCHC: 30.7 g/dL (ref 30.0–36.0)
MCV: 92 fL (ref 80.0–100.0)
MCV: 93.6 fL (ref 80.0–100.0)
Monocytes Absolute: 1.2 10*3/uL — ABNORMAL HIGH (ref 0.1–1.0)
Monocytes Absolute: 0.8 10*3/uL (ref 0.1–1.0)
Monocytes Relative: 6 %
Monocytes Relative: 5 %
Neutro Abs: 16.6 10*3/uL — ABNORMAL HIGH (ref 1.7–7.7)
Neutro Abs: 12.4 10*3/uL — ABNORMAL HIGH (ref 1.7–7.7)
Neutrophils Relative %: 85 %
Neutrophils Relative %: 84 %
Platelets: 127 10*3/uL — ABNORMAL LOW (ref 150–400)
Platelets: 80 10*3/uL — ABNORMAL LOW (ref 150–400)
RBC: 1.38 MIL/uL — ABNORMAL LOW (ref 4.22–5.81)
RBC: 2.82 MIL/uL — ABNORMAL LOW (ref 4.22–5.81)
RDW: 17.8 % — ABNORMAL HIGH (ref 11.5–15.5)
RDW: 16.4 % — ABNORMAL HIGH (ref 11.5–15.5)
Smear Review: NORMAL
WBC Morphology: INCREASED
WBC: 19.7 10*3/uL — ABNORMAL HIGH (ref 4.0–10.5)
WBC: 14.9 10*3/uL — ABNORMAL HIGH (ref 4.0–10.5)
nRBC: 9.3 % — ABNORMAL HIGH (ref 0.0–0.2)
nRBC: 7.6 % — ABNORMAL HIGH (ref 0.0–0.2)

## 2024-01-16 LAB — GLUCOSE, CAPILLARY
Glucose-Capillary: 212 mg/dL — ABNORMAL HIGH (ref 70–99)
Glucose-Capillary: 122 mg/dL — ABNORMAL HIGH (ref 70–99)
Glucose-Capillary: 152 mg/dL — ABNORMAL HIGH (ref 70–99)
Glucose-Capillary: 185 mg/dL — ABNORMAL HIGH (ref 70–99)
Glucose-Capillary: 160 mg/dL — ABNORMAL HIGH (ref 70–99)
Glucose-Capillary: 126 mg/dL — ABNORMAL HIGH (ref 70–99)

## 2024-01-16 LAB — BLOOD GAS, ARTERIAL
Acid-base deficit: 9.8 mmol/L — ABNORMAL HIGH (ref 0.0–2.0)
Acid-base deficit: 11.1 mmol/L — ABNORMAL HIGH (ref 0.0–2.0)
Acid-base deficit: 9.2 mmol/L — ABNORMAL HIGH (ref 0.0–2.0)
Bicarbonate: 17.1 mmol/L — ABNORMAL LOW (ref 20.0–28.0)
Bicarbonate: 16.8 mmol/L — ABNORMAL LOW (ref 20.0–28.0)
Bicarbonate: 17.5 mmol/L — ABNORMAL LOW (ref 20.0–28.0)
Drawn by: 51133
Drawn by: 331471
FIO2: 60 %
FIO2: 40 %
MECHVT: 560 mL
MECHVT: 360 mL
O2 Saturation: 98.5 %
O2 Saturation: 98.1 %
O2 Saturation: 97.6 %
PEEP: 5 cmH2O
PEEP: 5 cmH2O
Patient temperature: 37
Patient temperature: 36.8
Patient temperature: 37
RATE: 16 {breaths}/min
RATE: 20 {breaths}/min
pCO2 arterial: 40 mmHg (ref 32–48)
pCO2 arterial: 44 mmHg (ref 32–48)
pCO2 arterial: 40 mmHg (ref 32–48)
pH, Arterial: 7.24 — ABNORMAL LOW (ref 7.35–7.45)
pH, Arterial: 7.19 — CL (ref 7.35–7.45)
pH, Arterial: 7.25 — ABNORMAL LOW (ref 7.35–7.45)
pO2, Arterial: 120 mmHg — ABNORMAL HIGH (ref 83–108)
pO2, Arterial: 86 mmHg (ref 83–108)
pO2, Arterial: 73 mmHg — ABNORMAL LOW (ref 83–108)

## 2024-01-16 LAB — DIC (DISSEMINATED INTRAVASCULAR COAGULATION)PANEL
D-Dimer, Quant: 1.97 ug{FEU}/mL — ABNORMAL HIGH (ref 0.00–0.50)
D-Dimer, Quant: 2.82 ug{FEU}/mL — ABNORMAL HIGH (ref 0.00–0.50)
D-Dimer, Quant: 2.62 ug{FEU}/mL — ABNORMAL HIGH (ref 0.00–0.50)
Fibrinogen: 217 mg/dL (ref 210–475)
Fibrinogen: 214 mg/dL (ref 210–475)
Fibrinogen: 224 mg/dL (ref 210–475)
INR: 2.6 — ABNORMAL HIGH (ref 0.8–1.2)
INR: 2.3 — ABNORMAL HIGH (ref 0.8–1.2)
INR: 2.3 — ABNORMAL HIGH (ref 0.8–1.2)
Platelets: 134 10*3/uL — ABNORMAL LOW (ref 150–400)
Platelets: 80 10*3/uL — ABNORMAL LOW (ref 150–400)
Platelets: 137 10*3/uL — ABNORMAL LOW (ref 150–400)
Prothrombin Time: 27.9 s — ABNORMAL HIGH (ref 11.4–15.2)
Prothrombin Time: 25.4 s — ABNORMAL HIGH (ref 11.4–15.2)
Prothrombin Time: 25.8 s — ABNORMAL HIGH (ref 11.4–15.2)
Smear Review: NONE SEEN
Smear Review: NONE SEEN
Smear Review: NONE SEEN
aPTT: 56 s — ABNORMAL HIGH (ref 24–36)
aPTT: 47 s — ABNORMAL HIGH (ref 24–36)
aPTT: 39 s — ABNORMAL HIGH (ref 24–36)

## 2024-01-16 LAB — COMPREHENSIVE METABOLIC PANEL WITH GFR
ALT: 55 U/L — ABNORMAL HIGH (ref 0–44)
AST: 90 U/L — ABNORMAL HIGH (ref 15–41)
Albumin: 2.2 g/dL — ABNORMAL LOW (ref 3.5–5.0)
Alkaline Phosphatase: 20 U/L — ABNORMAL LOW (ref 38–126)
Anion gap: 18 — ABNORMAL HIGH (ref 5–15)
BUN: 35 mg/dL — ABNORMAL HIGH (ref 8–23)
CO2: 18 mmol/L — ABNORMAL LOW (ref 22–32)
Calcium: 6.9 mg/dL — ABNORMAL LOW (ref 8.9–10.3)
Chloride: 105 mmol/L (ref 98–111)
Creatinine, Ser: 2.78 mg/dL — ABNORMAL HIGH (ref 0.61–1.24)
GFR, Estimated: 25 mL/min — ABNORMAL LOW (ref >60)
Glucose, Bld: 186 mg/dL — ABNORMAL HIGH (ref 70–99)
Potassium: 6 mmol/L — ABNORMAL HIGH (ref 3.5–5.1)
Sodium: 141 mmol/L (ref 135–145)
Total Bilirubin: 0.9 mg/dL (ref 0.0–1.2)
Total Protein: 3.7 g/dL — ABNORMAL LOW (ref 6.5–8.1)

## 2024-01-16 LAB — POCT I-STAT 7, (LYTES, BLD GAS, ICA,H+H)
Acid-base deficit: 16 mmol/L — ABNORMAL HIGH (ref 0.0–2.0)
Bicarbonate: 13.6 mmol/L — ABNORMAL LOW (ref 20.0–28.0)
Calcium, Ion: 0.75 mmol/L — CL (ref 1.15–1.40)
HCT: 15 % — ABNORMAL LOW (ref 39.0–52.0)
Hemoglobin: 5.1 g/dL — CL (ref 13.0–17.0)
O2 Saturation: 97 %
Patient temperature: 34.1
Potassium: 4.7 mmol/L (ref 3.5–5.1)
Sodium: 140 mmol/L (ref 135–145)
TCO2: 15 mmol/L — ABNORMAL LOW (ref 22–32)
pCO2 arterial: 48.4 mmHg — ABNORMAL HIGH (ref 32–48)
pH, Arterial: 7.038 — CL (ref 7.35–7.45)
pO2, Arterial: 125 mmHg — ABNORMAL HIGH (ref 83–108)

## 2024-01-16 LAB — HEMOGLOBIN AND HEMATOCRIT, BLOOD
HCT: 21.5 % — ABNORMAL LOW (ref 39.0–52.0)
HCT: 24.5 % — ABNORMAL LOW (ref 39.0–52.0)
HCT: 25.8 % — ABNORMAL LOW (ref 39.0–52.0)
Hemoglobin: 7.4 g/dL — ABNORMAL LOW (ref 13.0–17.0)
Hemoglobin: 7.8 g/dL — ABNORMAL LOW (ref 13.0–17.0)
Hemoglobin: 8.4 g/dL — ABNORMAL LOW (ref 13.0–17.0)

## 2024-01-16 LAB — PROTIME-INR
INR: 2.7 — ABNORMAL HIGH (ref 0.8–1.2)
Prothrombin Time: 28.7 s — ABNORMAL HIGH (ref 11.4–15.2)

## 2024-01-16 LAB — TRIGLYCERIDES: Triglycerides: 79 mg/dL (ref <150)

## 2024-01-16 LAB — TROPONIN I (HIGH SENSITIVITY): Troponin I (High Sensitivity): 47 ng/L — ABNORMAL HIGH (ref <18)

## 2024-01-16 LAB — LACTIC ACID, PLASMA: Lactic Acid, Venous: >9 mmol/L (ref 0.5–1.9)

## 2024-01-16 LAB — PHOSPHORUS: Phosphorus: 8.3 mg/dL — ABNORMAL HIGH (ref 2.5–4.6)

## 2024-01-16 LAB — PREPARE RBC (CROSSMATCH)

## 2024-01-16 LAB — MASSIVE TRANSFUSION PROTOCOL ORDER (BLOOD BANK NOTIFICATION)

## 2024-01-16 LAB — MAGNESIUM: Magnesium: 2.4 mg/dL (ref 1.7–2.4)

## 2024-01-16 LAB — PREALBUMIN: Prealbumin: 5 mg/dL — ABNORMAL LOW (ref 18–38)

## 2024-01-16 SURGERY — LAPAROTOMY, EXPLORATORY
Anesthesia: General

## 2024-01-16 MED ORDER — SODIUM BICARBONATE 8.4 % IV SOLN
50.0000 meq | Freq: Once | INTRAVENOUS | Status: AC
  Administered 2024-01-16: 50 meq via INTRAVENOUS

## 2024-01-16 MED ORDER — SODIUM CHLORIDE 0.9% IV SOLUTION: Freq: Once | INTRAVENOUS | Status: DC

## 2024-01-16 MED ORDER — ROCURONIUM BROMIDE 10 MG/ML (PF) SYRINGE
PREFILLED_SYRINGE | INTRAVENOUS | Status: DC | PRN
Start: 2024-01-16 — End: 2024-01-16
  Administered 2024-01-16: 30 mg via INTRAVENOUS

## 2024-01-16 MED ORDER — ORAL CARE MOUTH RINSE: 15.0000 mL | OROMUCOSAL | Status: DC | PRN

## 2024-01-16 MED ORDER — 0.9 % SODIUM CHLORIDE (POUR BTL) OPTIME
TOPICAL | Status: DC | PRN
  Administered 2024-01-16 (×2): 1000 mL

## 2024-01-16 MED ORDER — HYDROCORTISONE SOD SUC (PF) 100 MG IJ SOLR
100.0000 mg | Freq: Four times a day (QID) | INTRAMUSCULAR | Status: DC
  Administered 2024-01-16 – 2024-01-18 (×8): 100 mg via INTRAVENOUS
  Filled 2024-01-16 (×8): qty 2

## 2024-01-16 MED ORDER — SODIUM BICARBONATE 8.4 % IV SOLN
INTRAVENOUS | Status: AC
  Administered 2024-01-16: 50 meq
  Filled 2024-01-16: qty 50

## 2024-01-16 MED ORDER — VITAMIN K1 10 MG/ML IJ SOLN
10.0000 mg | Freq: Once | INTRAVENOUS | Status: AC
  Administered 2024-01-16: 10 mg via INTRAVENOUS
  Filled 2024-01-16: qty 1

## 2024-01-16 MED ORDER — STERILE WATER FOR INJECTION IV SOLN
INTRAVENOUS | Status: AC
Start: 2024-01-16 — End: 2024-01-16
  Filled 2024-01-16: qty 150
  Filled 2024-01-16: qty 1000

## 2024-01-16 MED ORDER — LACTATED RINGERS IV BOLUS
1000.0000 mL | Freq: Once | INTRAVENOUS | Status: AC
  Administered 2024-01-16: 1000 mL via INTRAVENOUS

## 2024-01-16 MED ORDER — LACTATED RINGERS IV BOLUS: 500.0000 mL | Freq: Once | INTRAVENOUS | Status: DC

## 2024-01-16 MED ORDER — CALCIUM CHLORIDE 10 % IV SOLN
INTRAVENOUS | Status: DC | PRN
  Administered 2024-01-16 (×2): 500 mg via INTRAVENOUS

## 2024-01-16 MED ORDER — SODIUM BICARBONATE 8.4 % IV SOLN
100.0000 meq | Freq: Once | INTRAVENOUS | Status: AC
  Administered 2024-01-16: 100 meq via INTRAVENOUS
  Filled 2024-01-16: qty 50

## 2024-01-16 MED ORDER — STERILE WATER FOR INJECTION IV SOLN
INTRAVENOUS | Status: DC
  Filled 2024-01-16: qty 1000
  Filled 2024-01-16 (×3): qty 150
  Filled 2024-01-16: qty 1000
  Filled 2024-01-16 (×2): qty 150

## 2024-01-16 MED ORDER — SODIUM BICARBONATE 8.4 % IV SOLN
INTRAVENOUS | Status: AC
  Filled 2024-01-16: qty 50

## 2024-01-16 MED ORDER — METHYLENE BLUE (ANTIDOTE) 1 % IV SOLN
2.0000 mg/kg | Freq: Once | INTRAVENOUS | Status: DC
  Filled 2024-01-16: qty 16.1

## 2024-01-16 MED ORDER — LACTATED RINGERS IV SOLN: INTRAVENOUS | Status: DC | PRN

## 2024-01-16 MED ORDER — ORAL CARE MOUTH RINSE
15.0000 mL | OROMUCOSAL | Status: DC
Start: 2024-01-16 — End: 2024-01-22
  Administered 2024-01-16 – 2024-01-22 (×70): 15 mL via OROMUCOSAL

## 2024-01-16 MED ORDER — SODIUM CHLORIDE 0.9 % IV BOLUS
250.0000 mL | Freq: Once | INTRAVENOUS | Status: AC
  Administered 2024-01-16: 250 mL via INTRAVENOUS

## 2024-01-16 MED ORDER — PROPOFOL 500 MG/50ML IV EMUL
INTRAVENOUS | Status: AC
  Filled 2024-01-16: qty 50

## 2024-01-16 MED ORDER — PHENYLEPHRINE HCL-NACL 20-0.9 MG/250ML-% IV SOLN
0.0000 ug/min | INTRAVENOUS | Status: DC
  Administered 2024-01-16: 20 ug/min via INTRAVENOUS
  Administered 2024-01-16: 400 ug/min via INTRAVENOUS
  Administered 2024-01-16: 70 ug/min via INTRAVENOUS
  Administered 2024-01-16 (×2): 400 ug/min via INTRAVENOUS
  Administered 2024-01-16: 20 ug/min via INTRAVENOUS
  Administered 2024-01-16: 180 ug/min via INTRAVENOUS
  Administered 2024-01-16: 400 ug/min via INTRAVENOUS
  Administered 2024-01-16: 130 ug/min via INTRAVENOUS
  Administered 2024-01-16: 180 ug/min via INTRAVENOUS
  Administered 2024-01-16: 320 ug/min via INTRAVENOUS
  Administered 2024-01-17: 130 ug/min via INTRAVENOUS
  Administered 2024-01-17 (×2): 160 ug/min via INTRAVENOUS
  Filled 2024-01-16 (×10): qty 250
  Filled 2024-01-16: qty 500
  Filled 2024-01-16 (×4): qty 250

## 2024-01-16 MED ORDER — CALCIUM GLUCONATE-NACL 2-0.675 GM/100ML-% IV SOLN
2.0000 g | Freq: Once | INTRAVENOUS | Status: AC
  Administered 2024-01-16: 2000 mg via INTRAVENOUS
  Filled 2024-01-16: qty 100

## 2024-01-16 MED ORDER — SODIUM CHLORIDE 0.9% IV SOLUTION: Freq: Once | INTRAVENOUS | Status: AC

## 2024-01-16 MED ORDER — HYDROCORTISONE SOD SUC (PF) 100 MG IJ SOLR
100.0000 mg | Freq: Four times a day (QID) | INTRAMUSCULAR | Status: DC
Start: 2024-01-16 — End: 2024-01-16
  Administered 2024-01-16: 100 mg via INTRAVENOUS
  Filled 2024-01-16: qty 2

## 2024-01-16 MED ORDER — SODIUM CHLORIDE 0.9 % IV SOLN: INTRAVENOUS | Status: DC | PRN

## 2024-01-16 MED ORDER — CHLORHEXIDINE GLUCONATE CLOTH 2 % EX PADS
6.0000 | MEDICATED_PAD | Freq: Every day | CUTANEOUS | Status: DC
  Administered 2024-01-17 – 2024-02-10 (×24): 6 via TOPICAL

## 2024-01-16 MED ORDER — ALBUMIN HUMAN 25 % IV SOLN
12.5000 g | Freq: Once | INTRAVENOUS | Status: AC
  Administered 2024-01-16: 12.5 g via INTRAVENOUS
  Filled 2024-01-16: qty 50

## 2024-01-16 MED ORDER — ALBUMIN HUMAN 5 % IV SOLN
25.0000 g | Freq: Once | INTRAVENOUS | Status: AC
  Administered 2024-01-16: 25 g via INTRAVENOUS
  Filled 2024-01-16: qty 500

## 2024-01-16 SURGICAL SUPPLY — 45 items
BAG COUNTER SPONGE SURGICOUNT (BAG) IMPLANT
BLADE EXTENDED COATED 6.5IN (ELECTRODE) ×1 IMPLANT
CANISTER WOUNDNEG PRESSURE 500 (CANNISTER) IMPLANT
CELLS DAT CNTRL 66122 CELL SVR (MISCELLANEOUS) IMPLANT
CLIP TI LARGE 6 (CLIP) IMPLANT
COVER SURGICAL LIGHT HANDLE (MISCELLANEOUS) ×1 IMPLANT
DRAIN CHANNEL 19F RND (DRAIN) IMPLANT
DRAPE LAPAROSCOPIC ABDOMINAL (DRAPES) IMPLANT
DRSG OPSITE POSTOP 4X6 (GAUZE/BANDAGES/DRESSINGS) IMPLANT
DRSG OPSITE POSTOP 4X8 (GAUZE/BANDAGES/DRESSINGS) IMPLANT
ELECT REM PT RETURN 15FT ADLT (MISCELLANEOUS) ×1 IMPLANT
EVACUATOR SILICONE 100CC (DRAIN) ×1 IMPLANT
GAUZE COMBAT QUICK CLOT 3X4 (MISCELLANEOUS) IMPLANT
GAUZE SPONGE 4X4 12PLY STRL (GAUZE/BANDAGES/DRESSINGS) ×1 IMPLANT
GLOVE BIO SURGEON STRL SZ7.5 (GLOVE) ×1 IMPLANT
GLOVE INDICATOR 8.0 STRL GRN (GLOVE) ×1 IMPLANT
GOWN SPEC L4 XLG W/TWL (GOWN DISPOSABLE) ×1 IMPLANT
HANDLE SUCTION POOLE (INSTRUMENTS) IMPLANT
KIT TURNOVER KIT A (KITS) IMPLANT
LEGGING LITHOTOMY PAIR STRL (DRAPES) IMPLANT
LIGASURE IMPACT 36 18CM CVD LR (INSTRUMENTS) IMPLANT
NS IRRIG 1000ML POUR BTL (IV SOLUTION) ×2 IMPLANT
PACK GENERAL/GYN (CUSTOM PROCEDURE TRAY) ×1 IMPLANT
RETRACTOR WND ALEXIS 18 MED (MISCELLANEOUS) IMPLANT
RTRCTR WOUND ALEXIS 18CM MED (MISCELLANEOUS) IMPLANT
SHEARS FOC LG CVD HARMONIC 17C (MISCELLANEOUS) IMPLANT
SHEARS HARMONIC 36 ACE (MISCELLANEOUS) IMPLANT
SPONGE ABD ABTHERA ADVANCE (MISCELLANEOUS) IMPLANT
SPONGE T-LAP 18X18 ~~LOC~~+RFID (SPONGE) IMPLANT
STAPLER 90 3.5 STAND SLIM (STAPLE) ×1 IMPLANT
STAPLER 90 3.5 STD SLIM (STAPLE) IMPLANT
STAPLER SKIN PROX WIDE 3.9 (STAPLE) ×1 IMPLANT
SUCTION POOLE HANDLE (INSTRUMENTS) ×1 IMPLANT
SUT PDS AB 1 TP1 96 (SUTURE) IMPLANT
SUT PDS AB 3-0 SH 27 (SUTURE) ×1 IMPLANT
SUT PDS AB 4-0 SH 27 (SUTURE) IMPLANT
SUT PROLENE 2 0 BLUE (SUTURE) IMPLANT
SUT SILK 2 0 SH CR/8 (SUTURE) ×2 IMPLANT
SUT SILK 2 0SH CR/8 30 (SUTURE) IMPLANT
SUT SILK 2-0 18XBRD TIE 12 (SUTURE) ×2 IMPLANT
SUT SILK 2-0 30XBRD TIE 12 (SUTURE) IMPLANT
SUT SILK 3 0 SH CR/8 (SUTURE) ×2 IMPLANT
SUT SILK 3-0 18XBRD TIE 12 (SUTURE) ×2 IMPLANT
TOWEL OR 17X26 10 PK STRL BLUE (TOWEL DISPOSABLE) ×1 IMPLANT
TRAY FOLEY MTR SLVR 16FR STAT (SET/KITS/TRAYS/PACK) ×1 IMPLANT

## 2024-01-16 NOTE — Inpatient Diabetes Management (Signed)
 Inpatient Diabetes Program Recommendations  AACE/ADA: New Consensus Statement on Inpatient Glycemic Control (2015)  Target Ranges:  Prepandial:   less than 140 mg/dL      Peak postprandial:   less than 180 mg/dL (1-2 hours)      Critically ill patients:  140 - 180 mg/dL    Latest Reference Range & Units 12/03/23 14:37  Hemoglobin A1C 4.6 - 6.5 % 5.9    Latest Reference Range & Units 01/13/24 02:04 01/15/24 15:51 01/15/24 19:53 01/16/24 03:38 01/16/24 07:32 01/16/24 11:12  Glucose-Capillary 70 - 99 mg/dL 213 (H) 086 (H) 578 (H) 212 (H) 122 (H) 152 (H)  (H): Data is abnormally high    Admit with: Rectal bleeding and hypotension  No History of Diabetes     MD- Note pt getting Solucortef 100 mg Q6H Checking CBGs Q4H  Note couple of minor elevations last PM and this AM  May consider starting Novolog 0-6 units Q4 hours (very sensitive scale)    --Will follow patient during hospitalization--  Langston Pippins RN, MSN, CDCES Diabetes Coordinator Inpatient Glycemic Control Team Team Pager: [phone] (8a-5p)

## 2024-01-16 NOTE — Progress Notes (Addendum)
 Delayed note entry  Received signout from Dr. Hershell Lose around 7 AM.  He informed me of the night's events after he was alerted by CCM  Overnight patient had increasing pressor requirement.  Patient on 4 vasopressors with a blood pressure in the 140s to 150s.  Patient has had ongoing output from his wound VAC.  While hemoglobin was 8.3 last night his INR was out.  He received reportedly 2 units of PRBCs and 2 units of FFP overnight.  Most recent hemoglobin was 4.  I went up to the unit.  The son was at the bedside along with CCM attending and nursing staff.  The wound VAC output was serosanguineous perhaps a little bit bright red.  We had irrigated the abdomen with 6 L of saline.  Patient was clinically in hemorrhagic shock being on 4 vasopressors with a hemoglobin of 4 and coagulopathic.  We discussed options such as CT angio versus reexploration.  We discussed at length that the patient was high risk for death with or without surgery-reexploration.  Both the CCM attending and I thought the patient was too unstable to go to CT.  I was not terribly optimistic that we would find an intra-abdominal source of bleeding since the patient was coagulopathic and there was not any significant blood loss during surgery yesterday.  We had evacuated a large amount of melanotic stool from the right colon.  There was also significant intraoperative contamination from the 2 inch perforation in his right colon that he spontaneously evacuated into the abdomen during laparotomy yesterday.  And we had irrigated the abdomen with 6 L of saline to try to wash him out.  We activated massive transfusion protocol.  The son wanted us  to go back to the operating room to see if there is an intervention we could find to help stop the bleeding.  We did discuss CODE STATUS while in the operating room.  The son wanted his father to remain full code during surgery.  The patient was transported emergently to the operating room for  reexploration.  I also shared with the patient that prior to surgery yesterday I had asked the patient twice whether or not he wanted me to discuss the postoperative findings with any family member but he said no that he did not want me to call anyone.  Marianna Shirk. Elvan Hamel, MD, FACS General, Bariatric, & Minimally Invasive Surgery [facility],  A [facility]

## 2024-01-16 NOTE — Progress Notes (Addendum)
 Called and updated by critical care attending at 6:33 AM.  Repeat hemoglobin 2153 last night stable at 8.3.  INR of 3.1  Patient received numerous blood products and FFP's through the night.  Increasing need of pressors.  Hemoglobin now 4.  Still with blood-tinged ascites.  Suspect patient needs second look OR at this morning to attempt to try and salvage hemorrhagic and septic shock.  Operative risks extremely high with him still coagulopathic.  Called and discussed with Dr. Elvan Hamel just now.  Posted & updated OR front desk as well.  D/w Dr Lasalle Pointer, anesthesia attending.  Going to take to the OR as soon as possible this morning for second look operation in the hopes of finding some bleeding that can be surgically controlled, although prognosis very grim with his coagulopathy and severe shock.  Dr. Elvan Hamel evaluating discussing with family as this note is written to see if they agree.  According Dr. Elvan Hamel, the patient had refused him discussing with family postop.

## 2024-01-16 NOTE — Transfer of Care (Signed)
 Immediate Anesthesia Transfer of Care Note  Patient: Alexander [name]  Procedure(s) Performed: RE-EXPLORATION ABDOMEN, PLACEMENT OF ABTHERA WOUND VAC, SMALL BOWEL RESECTION  Patient Location: ICU  Anesthesia Type:General  Level of Consciousness: Patient remains intubated per anesthesia plan  Airway & Oxygen Therapy: Patient remains intubated per anesthesia plan  Post-op Assessment: Report given to RN  Post vital signs: Reviewed  Last Vitals:  Vitals Value Taken Time  BP    Temp    Pulse 97 01/16/24 0943  Resp 16 01/16/24 0943  SpO2 100 % 01/16/24 0943  Vitals shown include unfiled device data.  Last Pain:  Vitals:   01/16/24 0800  TempSrc: Axillary  PainSc:       Patients Stated Pain Goal: 4 (01/12/24 2355)  Complications: No notable events documented.

## 2024-01-16 NOTE — Progress Notes (Signed)
 RT bagged pt to OR with no complications. MD and CRNA at bedside.

## 2024-01-16 NOTE — OR Nursing (Signed)
 4 18X18 laps left inside patient. 2 in RUQ, 2 in LUQ. 2 combat gauze packets left inside patient, one on each side.

## 2024-01-16 NOTE — Anesthesia Preprocedure Evaluation (Signed)
 Anesthesia Evaluation  Patient identified by MRN, date of birth, ID band Patient unresponsive  Preop documentation limited or incomplete due to emergent nature of procedure.  Airway Mallampati: Intubated       Dental   Pulmonary former smoker          Cardiovascular hypertension,      Neuro/Psych    GI/Hepatic   Endo/Other    Renal/GU      Musculoskeletal   Abdominal   Peds  Hematology   Anesthesia Other Findings   Reproductive/Obstetrics                             Anesthesia Physical Anesthesia Plan  ASA: 4 and emergent  Anesthesia Plan: General   Post-op Pain Management:    Induction:   PONV Risk Score and Plan:   Airway Management Planned:   Additional Equipment:   Intra-op Plan:   Post-operative Plan: Post-operative intubation/ventilation  Informed Consent:   Plan Discussed with:   Anesthesia Plan Comments: (Emeregent review only patient full code)       Anesthesia Quick Evaluation

## 2024-01-16 NOTE — Progress Notes (Signed)
 Updated family at bedside around 6:30 PM  Son and other family members were present Prognosis discussed especially with ongoing pressor requirements and worsening overall status  CODE STATUS discussed, unable to make a decision at present regarding CODE STATUS - Remains a full code at present

## 2024-01-16 NOTE — Progress Notes (Signed)
 Seen and examined around 6 PM tonight.  Multiple family members at the bedside.  Unfortunately his pressor requirement while improved initially after surgery has returned and is back on all 4 pressors.  Moreover he has increased bloody output from the wound VAC.  Massive transfusion protocol was discontinued which I agree with.  He is currently getting some additional blood products.  Plan is to repeat labs after that that has completed.  Recommend serial labs and aggressive transfusion overnight-keep up with blood loss and try to reverse his coagulopathy  Another discussion with family about his CODE STATUS.  Right now they still want full code Explained that if he was to code again the likelihood of successful return of vitals would be very low Discussed with CCM and bedside nurse Updated on-call surgeon as well  Unfortunately I do not think return to the operating room is indicated with his ongoing bleeding because there was no overt bleeding source this morning.  Marianna Shirk. Elvan Hamel, MD, FACS General, Bariatric, & Minimally Invasive Surgery [facility] Surgery,  A [facility]

## 2024-01-16 NOTE — Progress Notes (Signed)
 PHARMACY - PHYSICIAN COMMUNICATION CRITICAL VALUE ALERT - BLOOD CULTURE IDENTIFICATION (BCID)  Mel Langan is an 63 y.o. male who presented to [facility] on 01/11/2024   Assessment:  1/6 GPR  Name of physician (or Provider) Contacted: [place]  Current antibiotics: zosyn  Changes to prescribed antibiotics recommended:  Patient is on recommended antibiotics - No changes needed  No results found for this or any previous visit.  Beau Bound RPh 01/16/2024, 3:56 AM

## 2024-01-16 NOTE — Progress Notes (Signed)
 Vent not done at this time due to pt in OR.

## 2024-01-16 NOTE — ED Provider Notes (Signed)
 I responded to this patient's hospital room given overhead page of medical alert CODE BLUE with 2 of our Maryan Smalling ED nurses.  By the time of our arrival in his room, patient had regained pulses after CPR.  He was previously intubated.  Remainder of care per ICU team   Sallyanne Creamer, DO 01/16/24 0116

## 2024-01-16 NOTE — Progress Notes (Addendum)
 Interval PCCM Note:  Seen and evaluated first thing this morning at the request of Elink.  Patient is 63 year old male who was critically ill in the setting of perforated colon, now in profound shock.  He did have brief cardiac arrest yesterday evening.  On my arrival patient is on Levophed @ 60, vasopressin 0.04, Neo-Synephrine @ 400, epinephrine @ 40, bicarb drip.  He is bleeding from his wound VAC and nursing reports putting out about 500 cc of blood about every 90 minutes.  Currently getting transfused and there is an order for 3 units of PRBC, FFP, platelets.  The son is at the bedside as well on my arrival.  I updated him on grave prognosis regardless of what we are doing and he understands.  Dr. Elvan Hamel came to bedside while I was evaluating the patient who performed hemicolectomy yesterday.  He is recommending massive transfusion and returning to the OR to possibly identify source of bleeding.  I re-discussed CODE STATUS with the son and he is requesting we give him "a fighting chance." Son notes that he has been unable to contact his sister, but will keep trying.

## 2024-01-16 NOTE — Progress Notes (Signed)
 NAME:  Alexander [name], MRN:  [id number], DOB:  01/03/1961, LOS: 5 ADMISSION DATE:  01/11/2024, CONSULTATION DATE:  01/15/24 REFERRING MD:  Dr Andrey Campanile, CHIEF COMPLAINT:  vent management   History of Present Illness:  Pt is encephelopathic; therefore, this HPI is obtained from chart review. Alexander [name] is a 63 y.o. male who has a PMH as outlined below including a limited to PE/DVT on Coumadin.  He was admitted to [facility] on 01/11/2024 with rectal bleeding and hypotension.  Symptoms had started 1 day earlier and had persisted.   CT in the ED was concerning for diverticulitis versus colitis versus neoplasm.  He was started on Zosyn and his Coumadin was held.    He was evaluated by gastroenterology who recommended antibiotic therapy and conservative management in the interim with possible EGD/colonoscopy at a later date (possibly 4 to 6 weeks after completion of 10 days of antibiotics alone as he did not become transfusion dependent).  Unfortunately after his admission, he required multiple transfusions for persistent anemia and bleeding.  He had repeat CT scan on 4/15 that demonstrated ongoing inflammation and a large contained perforation without abscess and pneumoperitoneum, increased gaseous distention of small and large bowel distention consistent with ileus.  He was evaluated by general surgery who initially opted for conservative management.  Unfortunately he had ongoing abdominal pain and unfortunately 16, decision was made to go to the OR for further evaluation management.   He was taken to the OR and had an exploratory laparotomy where he was found to have a perforated ascending colon with intra-abdominal abscess and ultimately underwent with right colectomy without anastomosis, placement of wound VAC, and drainage of intra-abdominal abscesses.  He was left open and plan was for return trip to the OR on Friday/18.   Return to the ICU on the ventilator, and PCCM was asked to assist with ventilator and  medical management.  Pertinent  Medical History  has Elevated uric acid in blood; Chronic GERD; Chronic foot pain, right; Chronic pain of right ankle; Acute pulmonary embolism (HCC); DVT (deep venous thrombosis) (HCC); Essential hypertension, benign; Gout; Pulmonary embolus (HCC); Hepatic steatosis; Spinal stenosis; Aortic atherosclerosis (HCC); Peripheral arterial disease (HCC); Sleep disturbance; Diverticulosis; High risk medication use; S/P surgical manipulation of ankle joint; Hyperlipidemia; Noncompliance; Impaired fasting blood sugar; Chronic diarrhea; Pulmonary hypertension (HCC); Cor pulmonale (HCC); Chronic thromboembolic disease (HCC); Thrombocytopenia (HCC); History of pulmonary embolism; Diverticulitis; Symptomatic anemia; AKI (acute kidney injury) (HCC); Lower GI bleed; Iron deficiency anemia; Diverticulitis of colon; ABLA (acute blood loss anemia); Hematochezia; Acute blood loss anemia; Rectal bleeding; Generalized abdominal pain; and Colon perforation (HCC) on their problem list.   Significant Hospital Events: Including procedures, antibiotic start and stop dates in addition to other pertinent events   4/14 admit 4/16 to OR for ex lap, returned to ICU on vent 4/17 cardiorespiratory arrest, achieved ROSC.  Coagulopathy 4/18 back to the OR  Interim History / Subjective:  Overnight events noted as documented Patient is very sick Unresponsive-on propofol, fentanyl  Objective   Blood pressure (!) 132/51, pulse (!) 104, temperature (!) 96.2 F (35.7 C), temperature source Axillary, resp. rate 17, height 5\' 9"  (1.753 m), weight 80.7 kg, SpO2 100%.    Vent Mode: PRVC FiO2 (%):  [50 %-100 %] 50 % Set Rate:  [16 bmp] 16 bmp Vt Set:  [560 mL] 560 mL PEEP:  [5 cmH20] 5 cmH20 Plateau Pressure:  [19 cmH20-27 cmH20] 22 cmH20   Intake/Output Summary (Last 24 hours) at 01/16/2024 [phone]  Last data filed at 01/16/2024 1610 Gross per 24 hour  Intake 11490.52 ml  Output 6085 ml  Net 5405.52 ml    Filed Weights   01/11/24 1435 01/15/24 0922  Weight: 80.7 kg 80.7 kg    Examination: General: [name], acutely ill-appearing HENT: Moist oral mucosa, endotracheal tube in place Lungs: Clear breath sounds bilaterally Cardiovascular: S1-S2 appreciated Abdomen: Postsurgical, open abdomen with wound VAC in place Extremities: No clubbing, no edema Neuro: Unresponsive GU: Fair output  Resolved Hospital Problem list     Assessment & Plan:  Post cardiorespiratory arrest Coagulopathy - Able to achieve ROSC - Now on 4 pressors at maximal doses - Will follow DIC panel - Being resuscitated with products including blood transfusion, FFP - Did receive 2 units FFP, cryo -1 unit of blood in the OR today  Ongoing goals of care discussions with family  Perforated ascending colon with intra-abdominal abscess - S/p exploratory laparotomy/16 with right colectomy without anastomosis - Taken back to the OR today-will refer to surgical notes - Still has wound VAC in place - Continue antibiotics - Appreciate surgical help  Sepsis secondary to above - Will continue Zosyn - Continue IV fluids - Continue pressors  GI bleed - Continue PPI - Hold anticoagulation  Acute hypoxemic respiratory failure - Continue full vent support - Pulmonary toileting  History of recurrent VTE - Therapeutic anticoagulation on hold at present  AKI - Maintain renal perfusion - Avoid nephrotoxic medications  Left femoral head AVN - No interventions  He remains critically ill - Discussed with Dr. Elvan Hamel at bedside - Will continue ongoing discussions with family regarding outlook - Discussed with anesthesiologist, Dr. Lasalle Pointer after he came back from the OR   Best Practice (right click and "Reselect all SmartList Selections" daily)   Diet/type: NPO DVT prophylaxis SCD Pressure ulcer(s): N/A GI prophylaxis: PPI Lines: Central line Foley:  Yes, and it is still needed Code Status:   full code Last date of multidisciplinary goals of care discussion [discussed with son at bedside and addressed for celebration on gradually.]  Labs   CBC: Recent Labs  Lab 01/11/24 1957 01/12/24 0056 01/13/24 9604 01/13/24 1624 01/14/24 0557 01/14/24 1820 01/15/24 0254 01/15/24 0940 01/15/24 1554 01/15/24 2027 01/15/24 2043 01/15/24 2045 01/15/24 2153 01/16/24 0427  WBC 14.7*   < > 21.5*   < > 14.8*  --  18.6*  --  6.6  --  9.8  --   --  19.7*  NEUTROABS 12.4*  --  19.7*  --  12.1*  --  15.4*  --   --   --   --   --   --  16.6*  HGB 6.7*   < > 6.5*   < > 7.7*   < > 8.4*   < > 8.0* 7.5* 7.2*  --  8.3* 4.0*  HCT 23.6*   < > 21.7*   < > 24.3*   < > 26.7*   < > 25.6* 22.0* 23.1*  --  25.8* 12.7*  MCV 76.6*   < > 80.4   < > 80.2  --  80.7  --  82.3  --  87.5  --   --  92.0  PLT 338   < > 239   < > 234  --  320  --  292  --  239 227 214 134*  127*   < > = values in this interval not displayed.    Basic Metabolic Panel: Recent Labs  Lab  01/11/24 1957 01/12/24 0056 01/12/24 1044 01/13/24 0450 01/14/24 0557 01/15/24 0254 01/15/24 1554 01/15/24 2027 01/15/24 2043 01/16/24 0427  NA 134* 136  --  136 137 139 138 139 137 141  K 3.6 3.9  --  4.1 3.9 3.5 3.9 4.2 4.1 6.0*  CL 106 106  --  105 107 102 105 106 107 105  CO2 20* 21*  --  15* 23 26 24   --  19* 18*  GLUCOSE 129* 104*  --  171* 100* 115* 184* 263* 251* 186*  BUN 17 17  --  26* 24* 24* 30* 30* 36* 35*  CREATININE 1.08 1.19  --  1.92* 1.09 0.97 1.28* 2.10* 1.86* 2.78*  CALCIUM 7.7* 8.1*  --  7.6* 8.1* 8.4* 8.1*  --  10.8* 6.9*  MG 2.1 1.7  --  2.0 2.2 2.4  --   --   --  2.4  PHOS 2.7  --  2.6  --   --   --   --   --   --  8.3*   GFR: Estimated Creatinine Clearance: 27.9 mL/min (A) (by C-G formula based on SCr of 2.78 mg/dL (H)). Recent Labs  Lab 01/11/24 1957 01/12/24 0056 01/13/24 0857 01/13/24 1234 01/13/24 1624 01/15/24 0254 01/15/24 1554 01/15/24 2042 01/15/24 2043 01/16/24 0427  PROCALCITON 0.94  --    --   --   --   --   --   --   --   --   WBC 14.7*   < >  --   --    < > 18.6* 6.6  --  9.8 19.7*  LATICACIDVEN  --    < > 2.7* 1.2  --   --   --  4.4*  --  >9.0*   < > = values in this interval not displayed.    Liver Function Tests: Recent Labs  Lab 01/11/24 1458 01/12/24 0056 01/15/24 1554 01/15/24 2043 01/16/24 0427  AST 27 16 21 24  90*  ALT 8 7 17 15  55*  ALKPHOS 56 51 33* 27* 20*  BILITOT 1.4* 0.7 0.8 0.8 0.9  PROT 7.7 7.0 5.3* 5.2* 3.7*  ALBUMIN 3.3* 3.1* 2.6* 3.0* 2.2*   No results for input(s): "LIPASE", "AMYLASE" in the last 168 hours. No results for input(s): "AMMONIA" in the last 168 hours.  ABG    Component Value Date/Time   PHART 7.19 (LL) 01/16/2024 0437   PCO2ART 44 01/16/2024 0437   PO2ART 86 01/16/2024 0437   HCO3 16.8 (L) 01/16/2024 0437   TCO2 22 01/15/2024 2027   ACIDBASEDEF 11.1 (H) 01/16/2024 0437   O2SAT 98.1 01/16/2024 0437     Coagulation Profile: Recent Labs  Lab 01/11/24 1458 01/12/24 1044 01/15/24 2045 01/15/24 2153 01/16/24 0427  INR 2.2* 2.2* 3.1* 3.0* 2.6*  2.7*    Cardiac Enzymes: Recent Labs  Lab 01/11/24 1957  CKTOTAL 87    HbA1C: Hgb A1c MFr Bld  Date/Time Value Ref Range Status  12/03/2023 02:37 PM 5.9 4.6 - 6.5 % Final    Comment:    Glycemic Control Guidelines for People with Diabetes:Non Diabetic:  <6%Goal of Therapy: <7%Additional Action Suggested:  >8%   01/22/2020 11:37 AM 5.2 4.8 - 5.6 % Final    Comment:             Prediabetes: 5.7 - 6.4          Diabetes: >6.4          Glycemic control for adults  with diabetes: <7.0     CBG: Recent Labs  Lab 01/13/24 0204 01/15/24 1551 01/15/24 1953 01/16/24 0338 01/16/24 0732  GLUCAP 148* 132* 214* 212* 122*    Review of Systems:     Past Medical History:  He,  has a past medical history of Acute pulmonary embolism (HCC) (09/28/2019), Allergy, Aortic atherosclerosis (HCC) (09/2019), Arthritis, Chronic thromboembolic disease (HCC) (07/13/2023),  Diverticulitis (2012), DVT (deep venous thrombosis) (HCC) (2019), DVT (deep venous thrombosis) (HCC) (09/2019), Gout (2007), History of gastroesophageal reflux (GERD), Hypertension (10/2019), Overweight (BMI 25.0-29.9), PAD (peripheral artery disease) (HCC) (09/2019), PE (pulmonary thromboembolism) (HCC) (09/2019), Pulmonary embolism (HCC) (2019), and Pulmonary hypertension (HCC) (07/12/2023).   Surgical History:   Past Surgical History:  Procedure Laterality Date   FOOT ARTHROPLASTY Right    HEMORRHOID SURGERY     IR ANGIOGRAM PULMONARY BILATERAL SELECTIVE  07/12/2023   IR ANGIOGRAM SELECTIVE EACH ADDITIONAL VESSEL  07/12/2023   IR ANGIOGRAM SELECTIVE EACH ADDITIONAL VESSEL  07/12/2023   IR INFUSION THROMBOL ARTERIAL INITIAL (MS)  07/12/2023   IR INFUSION THROMBOL ARTERIAL INITIAL (MS)  07/12/2023   IR THROMB F/U EVAL ART/VEN FINAL DAY (MS)  07/13/2023   IR US  GUIDE VASC ACCESS RIGHT  07/12/2023     Social History:   reports that he has quit smoking. His smoking use included cigarettes. He has never used smokeless tobacco. He reports current alcohol use of about 3.0 standard drinks of alcohol per week. He reports that he does not use drugs.   Family History:  His family history includes Asthma in his sister; Cancer in his father; Diabetes in his brother, brother, maternal aunt, and paternal aunt; Gout in his mother; Heart disease in his mother; Hypertension in his brother, brother, brother, father, mother, sister, and sister; Stroke in his mother. There is no history of Colon cancer or Prostate cancer.   Allergies No Known Allergies   The patient is critically ill with multiple organ systems failure and requires high complexity decision making for assessment and support, frequent evaluation and titration of therapies, application of advanced monitoring technologies and extensive interpretation of multiple databases. Critical Care Time devoted to patient care services described in this note  independent of APP/resident time (if applicable)  is 45 minutes.   Myer Artis MD [facility] Personal pager: See Amion If unanswered, please page CCM On-call: [phone]

## 2024-01-16 NOTE — Anesthesia Postprocedure Evaluation (Signed)
 Anesthesia Post Note  Patient: Dryden Tapley  Procedure(s) Performed: RE-EXPLORATION ABDOMEN, PLACEMENT OF ABTHERA WOUND VAC, SMALL BOWEL RESECTION     Patient location during evaluation: SICU Anesthesia Type: General Level of consciousness: sedated Pain management: pain level controlled Vital Signs Assessment: post-procedure vital signs reviewed and stable Respiratory status: patient remains intubated per anesthesia plan Cardiovascular status: stable Postop Assessment: no apparent nausea or vomiting Anesthetic complications: no  No notable events documented.  Last Vitals:  Vitals:   01/16/24 0745 01/16/24 0800  BP:    Pulse: (!) 104   Resp: 17   Temp:  (!) 35.7 C  SpO2: 100%     Last Pain:  Vitals:   01/16/24 0800  TempSrc: Axillary  PainSc:                  [name]
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
## 2024-01-16 NOTE — Progress Notes (Signed)
ABG sent to lab, lab made aware. 

## 2024-01-16 NOTE — Progress Notes (Signed)
 1610 CCM & surgery at bedside & decision made to take pt back to OR  PRBC started & Vitamin K given. [id number] this RN helped transport pt to OR, methylene blue given to OR staff for pt

## 2024-01-16 NOTE — TOC Progression Note (Addendum)
 Transition of Care [facility]) - Progression Note    Patient Details  Name: Alexander [name] MRN: [id number] Date of Birth: 1960/10/22  [facility]) CM/SW Contact  Levie Ream, RN Phone Number: 01/16/2024, 3:19 PM  Clinical Narrative:    Pt to OR on 4/17; currently on multiple vasoactive drips, and wound vac in place; pt also on ventilator; TOC is following.   Expected Discharge Plan: Home w Home Health Services Barriers to Discharge: Continued Medical Work up  Expected Discharge Plan and Services                                               Social Determinants of Health (SDOH) Interventions SDOH Screenings   Food Insecurity: No Food Insecurity (01/12/2024)  Housing: Low Risk  (01/12/2024)  Transportation Needs: No Transportation Needs (01/12/2024)  Utilities: Not At Risk (01/12/2024)  Depression (PHQ2-9): Low Risk  (07/24/2023)  Tobacco Use: Medium Risk (01/15/2024)    Readmission Risk Interventions    01/13/2024    9:13 AM 07/11/2023    2:11 PM  Readmission Risk Prevention Plan  Post Dischage Appt  Complete  Medication Screening  Complete  Transportation Screening Complete Complete  PCP or Specialist Appt within 3-5 Days Complete   HRI or Home Care Consult Complete   Social Work Consult for Recovery Care Planning/Counseling Complete   Palliative Care Screening Not Applicable   Medication Review Oceanographer) Complete

## 2024-01-17 ENCOUNTER — Encounter (HOSPITAL_COMMUNITY): Payer: Self-pay | Admitting: General Surgery

## 2024-01-17 DIAGNOSIS — N179 Acute kidney failure, unspecified: Secondary | ICD-10-CM | POA: Diagnosis not present

## 2024-01-17 DIAGNOSIS — I469 Cardiac arrest, cause unspecified: Secondary | ICD-10-CM | POA: Diagnosis not present

## 2024-01-17 DIAGNOSIS — K922 Gastrointestinal hemorrhage, unspecified: Secondary | ICD-10-CM

## 2024-01-17 DIAGNOSIS — J9601 Acute respiratory failure with hypoxia: Secondary | ICD-10-CM | POA: Diagnosis not present

## 2024-01-17 DIAGNOSIS — A419 Sepsis, unspecified organism: Secondary | ICD-10-CM | POA: Diagnosis not present

## 2024-01-17 LAB — DIC (DISSEMINATED INTRAVASCULAR COAGULATION)PANEL
D-Dimer, Quant: 2.93 ug{FEU}/mL — ABNORMAL HIGH (ref 0.00–0.50)
D-Dimer, Quant: 3.38 ug{FEU}/mL — ABNORMAL HIGH (ref 0.00–0.50)
D-Dimer, Quant: 3.73 ug{FEU}/mL — ABNORMAL HIGH (ref 0.00–0.50)
D-Dimer, Quant: 5.41 ug{FEU}/mL — ABNORMAL HIGH (ref 0.00–0.50)
Fibrinogen: 244 mg/dL (ref 210–475)
Fibrinogen: 266 mg/dL (ref 210–475)
Fibrinogen: 325 mg/dL (ref 210–475)
Fibrinogen: 363 mg/dL (ref 210–475)
INR: 2.1 — ABNORMAL HIGH (ref 0.8–1.2)
INR: 2 — ABNORMAL HIGH (ref 0.8–1.2)
INR: 1.6 — ABNORMAL HIGH (ref 0.8–1.2)
INR: 1.7 — ABNORMAL HIGH (ref 0.8–1.2)
Platelets: 143 10*3/uL — ABNORMAL LOW (ref 150–400)
Platelets: 162 10*3/uL (ref 150–400)
Platelets: 159 10*3/uL (ref 150–400)
Platelets: 158 10*3/uL (ref 150–400)
Prothrombin Time: 23.5 s — ABNORMAL HIGH (ref 11.4–15.2)
Prothrombin Time: 23.3 s — ABNORMAL HIGH (ref 11.4–15.2)
Prothrombin Time: 19.4 s — ABNORMAL HIGH (ref 11.4–15.2)
Prothrombin Time: 19.9 s — ABNORMAL HIGH (ref 11.4–15.2)
Smear Review: NONE SEEN
Smear Review: NONE SEEN
Smear Review: NONE SEEN
Smear Review: NONE SEEN
aPTT: 36 s (ref 24–36)
aPTT: 35 s (ref 24–36)
aPTT: 34 s (ref 24–36)
aPTT: 33 s (ref 24–36)

## 2024-01-17 LAB — BPAM FFP
Blood Product Expiration Date: 202504212359
Blood Product Expiration Date: 202504212359
Blood Product Expiration Date: 202504222359
Blood Product Expiration Date: 202504222359
Blood Product Expiration Date: 202504222359
Blood Product Expiration Date: 202504222359
Blood Product Expiration Date: 202504222359
Blood Product Expiration Date: 202504222359
ISSUE DATE / TIME: 202504162249
ISSUE DATE / TIME: 202504162249
ISSUE DATE / TIME: 202504170729
ISSUE DATE / TIME: 202504170729
ISSUE DATE / TIME: 202504170806
ISSUE DATE / TIME: 202504170806
ISSUE DATE / TIME: 202504171145
ISSUE DATE / TIME: 202504172019
Unit Type and Rh: 7300
Unit Type and Rh: 7300
Unit Type and Rh: 6200
Unit Type and Rh: 6200
Unit Type and Rh: 7300
Unit Type and Rh: 7300
Unit Type and Rh: 7300
Unit Type and Rh: 8400

## 2024-01-17 LAB — PREPARE FRESH FROZEN PLASMA
Unit division: 0
Unit division: 0
Unit division: 0
Unit division: 0
Unit division: 0

## 2024-01-17 LAB — CBC WITH DIFFERENTIAL/PLATELET
Abs Immature Granulocytes: 0.55 10*3/uL — ABNORMAL HIGH (ref 0.00–0.07)
Abs Immature Granulocytes: 1.02 10*3/uL — ABNORMAL HIGH (ref 0.00–0.07)
Basophils Absolute: 0.1 10*3/uL (ref 0.0–0.1)
Basophils Absolute: 0.1 10*3/uL (ref 0.0–0.1)
Basophils Relative: 1 %
Basophils Relative: 1 %
Eosinophils Absolute: 0 10*3/uL (ref 0.0–0.5)
Eosinophils Absolute: 0 10*3/uL (ref 0.0–0.5)
Eosinophils Relative: 0 %
Eosinophils Relative: 0 %
HCT: 20.3 % — ABNORMAL LOW (ref 39.0–52.0)
HCT: 23.8 % — ABNORMAL LOW (ref 39.0–52.0)
Hemoglobin: 7.3 g/dL — ABNORMAL LOW (ref 13.0–17.0)
Hemoglobin: 8.3 g/dL — ABNORMAL LOW (ref 13.0–17.0)
Immature Granulocytes: 3 %
Immature Granulocytes: 5 %
Lymphocytes Relative: 9 %
Lymphocytes Relative: 9 %
Lymphs Abs: 1.7 10*3/uL (ref 0.7–4.0)
Lymphs Abs: 1.9 10*3/uL (ref 0.7–4.0)
MCH: 30.7 pg (ref 26.0–34.0)
MCH: 29.6 pg (ref 26.0–34.0)
MCHC: 36 g/dL (ref 30.0–36.0)
MCHC: 34.9 g/dL (ref 30.0–36.0)
MCV: 85.3 fL (ref 80.0–100.0)
MCV: 85 fL (ref 80.0–100.0)
Monocytes Absolute: 0.7 10*3/uL (ref 0.1–1.0)
Monocytes Absolute: 0.7 10*3/uL (ref 0.1–1.0)
Monocytes Relative: 4 %
Monocytes Relative: 3 %
Neutro Abs: 16.7 10*3/uL — ABNORMAL HIGH (ref 1.7–7.7)
Neutro Abs: 16.9 10*3/uL — ABNORMAL HIGH (ref 1.7–7.7)
Neutrophils Relative %: 83 %
Neutrophils Relative %: 82 %
Platelets: 152 10*3/uL (ref 150–400)
Platelets: 159 10*3/uL (ref 150–400)
RBC: 2.38 MIL/uL — ABNORMAL LOW (ref 4.22–5.81)
RBC: 2.8 MIL/uL — ABNORMAL LOW (ref 4.22–5.81)
RDW: 16.4 % — ABNORMAL HIGH (ref 11.5–15.5)
RDW: 16.6 % — ABNORMAL HIGH (ref 11.5–15.5)
WBC: 19.8 10*3/uL — ABNORMAL HIGH (ref 4.0–10.5)
WBC: 20.6 10*3/uL — ABNORMAL HIGH (ref 4.0–10.5)
nRBC: 17.8 % — ABNORMAL HIGH (ref 0.0–0.2)
nRBC: 13.8 % — ABNORMAL HIGH (ref 0.0–0.2)

## 2024-01-17 LAB — PREPARE CRYOPRECIPITATE
Unit division: 0
Unit division: 0
Unit division: 0
Unit division: 0

## 2024-01-17 LAB — BPAM PLATELET PHERESIS
Blood Product Expiration Date: 202504192359
Blood Product Expiration Date: 202504192359
ISSUE DATE / TIME: 202504171810
Unit Type and Rh: 6200
Unit Type and Rh: 6200

## 2024-01-17 LAB — PHOSPHORUS: Phosphorus: 7.7 mg/dL — ABNORMAL HIGH (ref 2.5–4.6)

## 2024-01-17 LAB — BASIC METABOLIC PANEL WITH GFR
Anion gap: 19 — ABNORMAL HIGH (ref 5–15)
BUN: 52 mg/dL — ABNORMAL HIGH (ref 8–23)
CO2: 23 mmol/L (ref 22–32)
Calcium: 5.9 mg/dL — CL (ref 8.9–10.3)
Chloride: 97 mmol/L — ABNORMAL LOW (ref 98–111)
Creatinine, Ser: 3.88 mg/dL — ABNORMAL HIGH (ref 0.61–1.24)
GFR, Estimated: 17 mL/min — ABNORMAL LOW (ref >60)
Glucose, Bld: 104 mg/dL — ABNORMAL HIGH (ref 70–99)
Potassium: 5 mmol/L (ref 3.5–5.1)
Sodium: 139 mmol/L (ref 135–145)

## 2024-01-17 LAB — BPAM CRYOPRECIPITATE
Blood Product Expiration Date: 202504171343
Blood Product Expiration Date: 202504171343
Blood Product Expiration Date: 202504171539
Blood Product Expiration Date: 202504171539
ISSUE DATE / TIME: 202504170752
ISSUE DATE / TIME: 202504170752
ISSUE DATE / TIME: 202504170950
ISSUE DATE / TIME: 202504171450
Unit Type and Rh: 6200
Unit Type and Rh: 6200
Unit Type and Rh: 6200
Unit Type and Rh: 6200

## 2024-01-17 LAB — PREPARE PLATELET PHERESIS
Unit division: 0
Unit division: 0

## 2024-01-17 LAB — COMPREHENSIVE METABOLIC PANEL WITH GFR
ALT: 2239 U/L — ABNORMAL HIGH (ref 0–44)
AST: 5081 U/L — ABNORMAL HIGH (ref 15–41)
Albumin: 2.2 g/dL — ABNORMAL LOW (ref 3.5–5.0)
Alkaline Phosphatase: 115 U/L (ref 38–126)
Anion gap: 19 — ABNORMAL HIGH (ref 5–15)
BUN: 61 mg/dL — ABNORMAL HIGH (ref 8–23)
CO2: 25 mmol/L (ref 22–32)
Calcium: 5.8 mg/dL — CL (ref 8.9–10.3)
Chloride: 93 mmol/L — ABNORMAL LOW (ref 98–111)
Creatinine, Ser: 4.72 mg/dL — ABNORMAL HIGH (ref 0.61–1.24)
GFR, Estimated: 13 mL/min — ABNORMAL LOW (ref >60)
Glucose, Bld: 108 mg/dL — ABNORMAL HIGH (ref 70–99)
Potassium: 5.2 mmol/L — ABNORMAL HIGH (ref 3.5–5.1)
Sodium: 137 mmol/L (ref 135–145)
Total Bilirubin: 2.9 mg/dL — ABNORMAL HIGH (ref 0.0–1.2)
Total Protein: 4 g/dL — ABNORMAL LOW (ref 6.5–8.1)

## 2024-01-17 LAB — HEMOGLOBIN AND HEMATOCRIT, BLOOD
HCT: 22 % — ABNORMAL LOW (ref 39.0–52.0)
HCT: 21.3 % — ABNORMAL LOW (ref 39.0–52.0)
HCT: 19.8 % — ABNORMAL LOW (ref 39.0–52.0)
HCT: 24.5 % — ABNORMAL LOW (ref 39.0–52.0)
HCT: 23 % — ABNORMAL LOW (ref 39.0–52.0)
Hemoglobin: 7.5 g/dL — ABNORMAL LOW (ref 13.0–17.0)
Hemoglobin: 7.3 g/dL — ABNORMAL LOW (ref 13.0–17.0)
Hemoglobin: 7.1 g/dL — ABNORMAL LOW (ref 13.0–17.0)
Hemoglobin: 8.4 g/dL — ABNORMAL LOW (ref 13.0–17.0)
Hemoglobin: 8 g/dL — ABNORMAL LOW (ref 13.0–17.0)

## 2024-01-17 LAB — GLUCOSE, CAPILLARY
Glucose-Capillary: 104 mg/dL — ABNORMAL HIGH (ref 70–99)
Glucose-Capillary: 107 mg/dL — ABNORMAL HIGH (ref 70–99)
Glucose-Capillary: 115 mg/dL — ABNORMAL HIGH (ref 70–99)
Glucose-Capillary: 87 mg/dL (ref 70–99)
Glucose-Capillary: 91 mg/dL (ref 70–99)
Glucose-Capillary: 93 mg/dL (ref 70–99)
Glucose-Capillary: 94 mg/dL (ref 70–99)

## 2024-01-17 LAB — LACTIC ACID, PLASMA: Lactic Acid, Venous: 7 mmol/L (ref 0.5–1.9)

## 2024-01-17 LAB — MAGNESIUM: Magnesium: 2 mg/dL (ref 1.7–2.4)

## 2024-01-17 LAB — PREPARE RBC (CROSSMATCH)

## 2024-01-17 LAB — SURGICAL PATHOLOGY

## 2024-01-17 LAB — PROTIME-INR
INR: 2.2 — ABNORMAL HIGH (ref 0.8–1.2)
Prothrombin Time: 25 s — ABNORMAL HIGH (ref 11.4–15.2)

## 2024-01-17 MED ORDER — FUROSEMIDE 10 MG/ML IJ SOLN
8.0000 mg/h | INTRAVENOUS | Status: DC
  Administered 2024-01-17: 4 mg/h via INTRAVENOUS
  Administered 2024-01-18: 8 mg/h via INTRAVENOUS
  Filled 2024-01-17 (×2): qty 20

## 2024-01-17 MED ORDER — SODIUM CHLORIDE 0.9% IV SOLUTION: Freq: Once | INTRAVENOUS | Status: AC

## 2024-01-17 MED ORDER — DEXTROSE 5 % IV SOLN
500.0000 mg | Freq: Once | INTRAVENOUS | Status: AC
  Administered 2024-01-17: 500 mg via INTRAVENOUS
  Filled 2024-01-17: qty 17.8

## 2024-01-17 MED ORDER — PROTHROMBIN COMPLEX CONC HUMAN 500 UNITS IV KIT
2254.0000 [IU] | PACK | Status: AC
  Administered 2024-01-17: 2254 [IU] via INTRAVENOUS
  Filled 2024-01-17: qty 2254

## 2024-01-17 MED ORDER — CALCIUM GLUCONATE-NACL 2-0.675 GM/100ML-% IV SOLN
2.0000 g | Freq: Once | INTRAVENOUS | Status: AC
  Administered 2024-01-17: 2000 mg via INTRAVENOUS
  Filled 2024-01-17: qty 100

## 2024-01-17 MED ORDER — FUROSEMIDE 10 MG/ML IJ SOLN
40.0000 mg | Freq: Once | INTRAMUSCULAR | Status: AC
  Administered 2024-01-17: 40 mg via INTRAVENOUS
  Filled 2024-01-17: qty 4

## 2024-01-17 MED ORDER — SODIUM CHLORIDE 0.9 % IV SOLN
4.0000 g | Freq: Once | INTRAVENOUS | Status: AC
  Administered 2024-01-17: 4 g via INTRAVENOUS
  Filled 2024-01-17: qty 40

## 2024-01-17 MED ORDER — PIPERACILLIN-TAZOBACTAM IN DEX 2-0.25 GM/50ML IV SOLN
2.2500 g | Freq: Four times a day (QID) | INTRAVENOUS | Status: DC
  Administered 2024-01-18 (×2): 2.25 g via INTRAVENOUS
  Filled 2024-01-17 (×4): qty 50

## 2024-01-17 NOTE — Progress Notes (Addendum)
 Updated family at bedside  Ongoing concern with his kidneys  I had started him on Lasix  drip and Diuril  -no significant effect  May need CRRT  Pressor requirement did stabilize  H&H did stabilize  Remains critically ill  Extra critical care time 30 minutes  Elevated liver enzymes likely related to hepatic congestion, severe transaminitis/shock liver

## 2024-01-17 NOTE — Plan of Care (Signed)
  Problem: Clinical Measurements: Goal: Cardiovascular complication will be avoided Outcome: Progressing   Problem: Coping: Goal: Level of anxiety will decrease Outcome: Progressing   Problem: Pain Managment: Goal: General experience of comfort will improve and/or be controlled Outcome: Progressing   Problem: Safety: Goal: Ability to remain free from injury will improve Outcome: Progressing   Problem: Activity: Goal: Ability to tolerate increased activity will improve Outcome: Progressing   Problem: Nutrition: Goal: Adequate nutrition will be maintained Outcome: Not Progressing   Problem: Elimination: Goal: Will not experience complications related to urinary retention Outcome: Not Progressing   Problem: Respiratory: Goal: Ability to maintain a clear airway and adequate ventilation will improve Outcome: Not Progressing   Problem: Role Relationship: Goal: Method of communication will improve Outcome: Not Progressing

## 2024-01-17 NOTE — H&P (View-Only) (Signed)
 Appears hgb is stable and INR improving Will go ahead and post for OR in am for re-exploration of abdomen, ileostomy, abdominal closure (assuming no major events overnight)   Marianna Shirk. Elvan Hamel, MD, FACS General, Bariatric, & Minimally Invasive Surgery [facility],  [facility]

## 2024-01-17 NOTE — Progress Notes (Signed)
 Appears hgb is stable and INR improving Will go ahead and post for OR in am for re-exploration of abdomen, ileostomy, abdominal closure (assuming no major events overnight)   Marianna Shirk. Elvan Hamel, MD, FACS General, Bariatric, & Minimally Invasive Surgery [facility],  [facility]
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
## 2024-01-17 NOTE — Progress Notes (Signed)
 Initial Nutrition Assessment  DOCUMENTATION CODES:   Not applicable  INTERVENTION:  - As patient has had minimal nutrition since admit (6 days ago), if unable to initiate enteral nutrition in the next 1-2 days, would recommend consideration for TPN if medically appropriate.   NUTRITION DIAGNOSIS:   Inadequate oral intake related to inability to eat as evidenced by NPO status.  GOAL:   Patient will meet greater than or equal to 90% of their needs  MONITOR:   Vent status, Labs, Weight trends  REASON FOR ASSESSMENT:   Ventilator    ASSESSMENT:   63 y.o. male who has a PMH including HTN, PAD, diverticulitis who was admitted with rectal bleeding and hypotension.  4/12 Admit 4/13 CLD 4/15 CT a/p showing suspicion for contained colon perforation and ileus 4/16 OR for ex-lap, right colectomy, wound vac placement; returned to ICU intubated 4/17 OR for re-exploration of abdomen, wound vac placement, small bowel resection, left in discontinuity   Patient is currently intubated on ventilator support MV: 11.4 L/min Temp (24hrs), Avg:97.8 F (36.6 C), Min:93.6 F (34.2 C), Max:99.5 F (37.5 C) OGT in place, remains to suction.  Patient's son at bedside. He estimated patient's UBW to be around 200# and reports he has been losing weight for several months to years.  Per EMR, patient weighed at 183# in October and dropped to 174# by the beginning of March (not significant for the time frame). Weight appears stable since then.  Son reports he is unsure of how his dad has been eating day to day as he lives an hour and a half away. States he suspects it has been up and down for some time.   Per chart review, patient has had minimal/no intake since admit 6 days ago. He has been to the OR several times and has been left in discontinuity at this time.   No plan for nutrition at this time. Per CCM note today, prognosis is poor. Will monitor for progress and ongoing GOC discussions.      Medications reviewed and include:  Fentanyl  Levophed  @ 26 mcg/min Phenylephrine  @ 130 mcg/min Vasopressin  @ 0.04 units/min  Propofol  @ 24.58mL/hr (provides 639 kcals over 42 hours)  Labs reviewed:  Creatinine 3.88 Phosphorus 7.7   NUTRITION - FOCUSED PHYSICAL EXAM:  Flowsheet Row Most Recent Value  Orbital Region No depletion  Upper Arm Region No depletion  Thoracic and Lumbar Region No depletion  Buccal Region Unable to assess  Temple Region No depletion  Clavicle Bone Region No depletion  Clavicle and Acromion Bone Region No depletion  Scapular Bone Region Unable to assess  Dorsal Hand No depletion  Patellar Region No depletion  Anterior Thigh Region No depletion  Posterior Calf Region No depletion  Edema (RD Assessment) Mild  Hair Reviewed  Eyes Unable to assess  Mouth Unable to assess  Skin Reviewed  Nails Reviewed       Diet Order:   Diet Order             Diet NPO time specified Except for: Ice Chips  Diet effective now                   EDUCATION NEEDS:  Education needs have been addressed  Skin:  Skin Assessment: Skin Integrity Issues: Skin Integrity Issues:: Incisions, Other (Comment) Incisions: Abdomen Other: Several skin tears  Last BM:  4/18 - rectal tube  Height:  Ht Readings from Last 1 Encounters:  01/15/24 5\' 9"  (1.753 m)  Weight:  Wt Readings from Last 1 Encounters:  01/15/24 80.7 kg   Ideal Body Weight:  72.73 kg  BMI:  Body mass index is 26.27 kg/m.  Estimated Nutritional Needs:  Kcal:  2000-2250 kcals Protein:  115-130 grams Fluid:  >/= 2L    Scheryl Cushing RD, LDN Contact via Secure Chat.

## 2024-01-17 NOTE — Progress Notes (Signed)
 Patient not making much urine  I did start him on Lasix  this morning  Will increase Lasix  to 8 mg an hour Give a dose of Diuril  500  He remains on pressors  Labs ordered for 1700

## 2024-01-17 NOTE — Progress Notes (Signed)
 NAME:  Alexander [name], MRN:  [id number], DOB:  [birth year], LOS: 6 ADMISSION DATE:  01/11/2024, CONSULTATION DATE:  01/15/24 REFERRING MD:  Dr Elvan Hamel, CHIEF COMPLAINT:  vent management   History of Present Illness:  Pt is encephelopathic; therefore, this HPI is obtained from chart review. Alexander [name] is a 63 y.o. male who has a PMH as outlined below including a limited to PE/DVT on Coumadin .  He was admitted to [facility] on 01/11/2024 with rectal bleeding and hypotension.  Symptoms had started 1 day earlier and had persisted.   CT in the ED was concerning for diverticulitis versus colitis versus neoplasm.  He was started on Zosyn  and his Coumadin  was held.    He was evaluated by gastroenterology who recommended antibiotic therapy and conservative management in the interim with possible EGD/colonoscopy at a later date (possibly 4 to 6 weeks after completion of 10 days of antibiotics alone as he did not become transfusion dependent).  Unfortunately after his admission, he required multiple transfusions for persistent anemia and bleeding.  He had repeat CT scan on 4/15 that demonstrated ongoing inflammation and a large contained perforation without abscess and pneumoperitoneum, increased gaseous distention of small and large bowel distention consistent with ileus.  He was evaluated by general surgery who initially opted for conservative management.  Unfortunately he had ongoing abdominal pain and unfortunately 16, decision was made to go to the OR for further evaluation management.   He was taken to the OR and had an exploratory laparotomy where he was found to have a perforated ascending colon with intra-abdominal abscess and ultimately underwent with right colectomy without anastomosis, placement of wound VAC, and drainage of intra-abdominal abscesses.  He was left open and plan was for return trip to the OR on Friday/18.   Return to the ICU on the ventilator, and PCCM was asked to assist with ventilator and  medical management.  Pertinent  Medical History  has Elevated uric acid in blood; Chronic GERD; Chronic foot pain, right; Chronic pain of right ankle; Acute pulmonary embolism (HCC); DVT (deep venous thrombosis) (HCC); Essential hypertension, benign; Gout; Pulmonary embolus (HCC); Hepatic steatosis; Spinal stenosis; Aortic atherosclerosis (HCC); Peripheral arterial disease (HCC); Sleep disturbance; Diverticulosis; High risk medication use; S/P surgical manipulation of ankle joint; Hyperlipidemia; Noncompliance; Impaired fasting blood sugar; Chronic diarrhea; Pulmonary hypertension (HCC); Cor pulmonale (HCC); Chronic thromboembolic disease (HCC); Thrombocytopenia (HCC); History of pulmonary embolism; Diverticulitis; Symptomatic anemia; AKI (acute kidney injury) (HCC); Lower GI bleed; Iron  deficiency anemia; Diverticulitis of colon; ABLA (acute blood loss anemia); Hematochezia; Acute blood loss anemia; Rectal bleeding; Generalized abdominal pain; and Colon perforation (HCC) on their problem list.   Significant Hospital Events: Including procedures, antibiotic start and stop dates in addition to other pertinent events   4/14 admit 4/16 to OR for ex lap, returned to ICU on vent 4/17 cardiorespiratory arrest, achieved ROSC.  Coagulopathy 4/18 back to the OR 4/19 remains encephalopathic,   Antibiotics - Zosyn  4/12>>  Interim History / Subjective:  Overnight events noted as documented Remains very sick, unresponsive,  Objective   Blood pressure (!) 132/51, pulse 87, temperature 99.1 F (37.3 C), resp. rate 20, height 5\' 9"  (1.753 m), weight 80.7 kg, SpO2 95%.    Vent Mode: PRVC FiO2 (%):  [40 %-55 %] 50 % Set Rate:  [16 bmp-20 bmp] 20 bmp Vt Set:  [560 mL] 560 mL PEEP:  [5 cmH20] 5 cmH20 Plateau Pressure:  [19 cmH20-21 cmH20] 21 cmH20   Intake/Output Summary (Last 24 hours) at  01/17/2024 0710 Last data filed at 01/17/2024 0647 Gross per 24 hour  Intake 14539.24 ml  Output 4063 ml  Net  10476.24 ml   Filed Weights   01/11/24 1435 01/15/24 0922  Weight: 80.7 kg 80.7 kg    Examination: General: Middle-aged, acutely ill-appearing HENT: Moist oral mucosa, endotracheal tube in place Lungs: Coarse breath sounds Cardiovascular: S1-S2 appreciated Abdomen: Postsurgical, open abdomen with wound VAC in place Extremities: No clubbing, no edema Neuro: Unresponsive GU: poor output  Resolved Hospital Problem list     Assessment & Plan:   Post cardiorespiratory arrest for 16 2025 Coagulopathy Shock -Weaned down to 3 pressors but still at high doses -Continues to ooze with no site of bleeding noted with recent exploration  Transfusion events so far Has had a total of  -10 PRBC -1 platelet -5-FFP -2 cryoprecipitate  -Plan will be to give Kcentra  today  Ongoing goals of care discussions with the family  Acute kidney injury Anuria - Maintain renal perfusion - Significantly fluid positive however still on very high doses of pressors  Sepsis secondary to above - Will continue Zosyn  - Continue pressors  GI bleed - Continue PPI  Acute hypoxemic respiratory failure - Continue full vent support - Pulmonary toileting  Anticoagulation remains on hold - Continues to persistently bleed  Left femoral head AVN - No interventions  He remains critically ill - Discussed with Dr. Elvan Hamel - Ongoing discussions with the family regarding outlook  Prognosis is poor Family desires ongoing aggressive support  He is 23 L positive - Will start cautious diuresis   Best Practice (right click and "Reselect all SmartList Selections" daily)   Diet/type: NPO DVT prophylaxis SCD Pressure ulcer(s): N/A GI prophylaxis: PPI Lines: Central line Foley:  Yes, and it is still needed Code Status:  full code Last date of multidisciplinary goals of care discussion [discussed with son at bedside and addressed for celebration on gradually.]  Labs   CBC: Recent Labs  Lab  01/14/24 0557 01/14/24 1820 01/15/24 0254 01/15/24 0940 01/15/24 1554 01/15/24 2027 01/15/24 2043 01/15/24 2045 01/16/24 0427 01/16/24 0911 01/16/24 1246 01/16/24 1552 01/16/24 1950 01/16/24 2343 01/17/24 0119 01/17/24 0120 01/17/24 0324 01/17/24 0514  WBC 14.8*  --  18.6*  --  6.6  --  9.8  --  19.7*  --  14.9*  --   --   --   --   --   --  19.8*  NEUTROABS 12.1*  --  15.4*  --   --   --   --   --  16.6*  --  12.4*  --   --   --   --   --   --  16.7*  HGB 7.7*   < > 8.4*   < > 8.0*   < > 7.2*   < > 4.0*   < > 8.1*   < > 8.4* 7.4* 7.5*  --  7.3* 7.3*  HCT 24.3*   < > 26.7*   < > 25.6*   < > 23.1*   < > 12.7*   < > 26.4*   < > 25.8* 21.5* 22.0*  --  21.3* 20.3*  MCV 80.2  --  80.7  --  82.3  --  87.5  --  92.0  --  93.6  --   --   --   --   --   --  85.3  PLT 234  --  320  --  292  --  239   < > 134*  127*  --  80*  80*  --  137*  --   --  143*  --  152   < > = values in this interval not displayed.    Basic Metabolic Panel: Recent Labs  Lab 01/11/24 1957 01/12/24 0056 01/12/24 1044 01/13/24 0450 01/14/24 0557 01/15/24 0254 01/15/24 1554 01/15/24 2027 01/15/24 2043 01/16/24 0427 01/16/24 0911  NA 134* 136  --  136 137 139 138 139 137 141 140  K 3.6 3.9  --  4.1 3.9 3.5 3.9 4.2 4.1 6.0* 4.7  CL 106 106  --  105 107 102 105 106 107 105  --   CO2 20* 21*  --  15* 23 26 24   --  19* 18*  --   GLUCOSE 129* 104*  --  171* 100* 115* 184* 263* 251* 186*  --   BUN 17 17  --  26* 24* 24* 30* 30* 36* 35*  --   CREATININE 1.08 1.19  --  1.92* 1.09 0.97 1.28* 2.10* 1.86* 2.78*  --   CALCIUM  7.7* 8.1*  --  7.6* 8.1* 8.4* 8.1*  --  10.8* 6.9*  --   MG 2.1 1.7  --  2.0 2.2 2.4  --   --   --  2.4  --   PHOS 2.7  --  2.6  --   --   --   --   --   --  8.3*  --    GFR: Estimated Creatinine Clearance: 27.9 mL/min (A) (by C-G formula based on SCr of 2.78 mg/dL (H)). Recent Labs  Lab 01/11/24 1957 01/12/24 0056 01/13/24 1234 01/13/24 1624 01/15/24 2042 01/15/24 2043  01/16/24 0427 01/16/24 1246 01/17/24 0514  PROCALCITON 0.94  --   --   --   --   --   --   --   --   WBC 14.7*   < >  --    < >  --  9.8 19.7* 14.9* 19.8*  LATICACIDVEN  --    < > 1.2  --  4.4*  --  >9.0*  --  7.0*   < > = values in this interval not displayed.    Liver Function Tests: Recent Labs  Lab 01/11/24 1458 01/12/24 0056 01/15/24 1554 01/15/24 2043 01/16/24 0427  AST 27 16 21 24  90*  ALT 8 7 17 15  55*  ALKPHOS 56 51 33* 27* 20*  BILITOT 1.4* 0.7 0.8 0.8 0.9  PROT 7.7 7.0 5.3* 5.2* 3.7*  ALBUMIN  3.3* 3.1* 2.6* 3.0* 2.2*   No results for input(s): "LIPASE", "AMYLASE" in the last 168 hours. No results for input(s): "AMMONIA" in the last 168 hours.  ABG    Component Value Date/Time   PHART 7.25 (L) 01/16/2024 1528   PCO2ART 40 01/16/2024 1528   PO2ART 73 (L) 01/16/2024 1528   HCO3 17.5 (L) 01/16/2024 1528   TCO2 15 (L) 01/16/2024 0911   ACIDBASEDEF 9.2 (H) 01/16/2024 1528   O2SAT 97.6 01/16/2024 1528     Coagulation Profile: Recent Labs  Lab 01/16/24 0427 01/16/24 1246 01/16/24 1950 01/17/24 0120 01/17/24 0514  INR 2.6*  2.7* 2.3* 2.3* 2.1* 2.2*    Cardiac Enzymes: Recent Labs  Lab 01/11/24 1957  CKTOTAL 87    HbA1C: Hgb A1c MFr Bld  Date/Time Value Ref Range Status  12/03/2023 02:37 PM 5.9 4.6 - 6.5 % Final    Comment:    Glycemic Control Guidelines  for People with Diabetes:Non Diabetic:  <6%Goal of Therapy: <7%Additional Action Suggested:  >8%   01/22/2020 11:37 AM 5.2 4.8 - 5.6 % Final    Comment:             Prediabetes: 5.7 - 6.4          Diabetes: >6.4          Glycemic control for adults with diabetes: <7.0     CBG: Recent Labs  Lab 01/16/24 1608 01/16/24 1945 01/16/24 2319 01/17/24 0339 01/17/24 0513  GLUCAP 185* 160* 126* 94 93    Review of Systems:     Past Medical History:  He,  has a past medical history of Acute pulmonary embolism (HCC) (09/28/2019), Allergy, Aortic atherosclerosis (HCC) (09/2019), Arthritis,  Chronic thromboembolic disease (HCC) (07/13/2023), Diverticulitis (2012), DVT (deep venous thrombosis) (HCC) (2019), DVT (deep venous thrombosis) (HCC) (09/2019), Gout (2007), History of gastroesophageal reflux (GERD), Hypertension (10/2019), Overweight (BMI 25.0-29.9), PAD (peripheral artery disease) (HCC) (09/2019), PE (pulmonary thromboembolism) (HCC) (09/2019), Pulmonary embolism (HCC) (2019), and Pulmonary hypertension (HCC) (07/12/2023).   Surgical History:   Past Surgical History:  Procedure Laterality Date   FOOT ARTHROPLASTY Right    HEMORRHOID SURGERY     IR ANGIOGRAM PULMONARY BILATERAL SELECTIVE  07/12/2023   IR ANGIOGRAM SELECTIVE EACH ADDITIONAL VESSEL  07/12/2023   IR ANGIOGRAM SELECTIVE EACH ADDITIONAL VESSEL  07/12/2023   IR INFUSION THROMBOL ARTERIAL INITIAL (MS)  07/12/2023   IR INFUSION THROMBOL ARTERIAL INITIAL (MS)  07/12/2023   IR THROMB F/U EVAL ART/VEN FINAL DAY (MS)  07/13/2023   IR US  GUIDE VASC ACCESS RIGHT  07/12/2023   LAPAROTOMY N/A 01/15/2024   Procedure: LAPAROTOMY, EXPLORATORY DRAINAGE INTRA- ABDOMINAL ABCESS RIGHT COLECTOMY PLACEMENT OF ABTHERA WOUND VAC;  Surgeon: [name], MD;  Location: [facility];  Service: General;  Laterality: N/A;  BOWEL RESECTION, POSSIBLE COLOSTOMY     Social History:   reports that he has quit smoking. His smoking use included cigarettes. He has never used smokeless tobacco. He reports current alcohol use of about 3.0 standard drinks of alcohol per week. He reports that he does not use drugs.   Family History:  His family history includes Asthma in his sister; Cancer in his father; Diabetes in his brother, brother, maternal aunt, and paternal aunt; Gout in his mother; Heart disease in his mother; Hypertension in his brother, brother, brother, father, mother, sister, and sister; Stroke in his mother. There is no history of Colon cancer or Prostate cancer.   Allergies No Known Allergies   The patient is critically ill with  multiple organ systems failure and requires high complexity decision making for assessment and support, frequent evaluation and titration of therapies, application of advanced monitoring technologies and extensive interpretation of multiple databases. Critical Care Time devoted to patient care services described in this note independent of APP/resident time (if applicable)  is 33 minutes.   Myer Artis MD [facility] Personal pager: See Amion If unanswered, please page CCM On-call: #[phone]

## 2024-01-17 NOTE — Progress Notes (Signed)
 PHARMACY NOTE:  ANTIMICROBIAL RENAL DOSAGE ADJUSTMENT  Current antimicrobial regimen includes a mismatch between antimicrobial dosage and estimated renal function.  As per policy approved by the Pharmacy & Therapeutics and Medical Executive Committees, the antimicrobial dosage will be adjusted accordingly.  Current antimicrobial dosage:  Zosyn  3.375 gr IV q8h   Indication: IAI  Renal Function:  Estimated Creatinine Clearance: 16.4 mL/min (A) (by C-G formula based on SCr of 4.72 mg/dL (H)). []      On intermittent HD, scheduled: []      On CRRT    Antimicrobial dosage has been changed to:   - Zosyn  2.25 gr IV q6h   Additional comments:   Reyden Smith, PharmD, BCPS 01/17/2024 9:19 PM

## 2024-01-17 NOTE — Progress Notes (Signed)
 Patient ID: Alexander [name], male   DOB: November 11, 1960, 63 y.o.   MRN: [id number]   Acute Care Surgery Service Progress Note:    Chief Complaint/Subjective: Intubated  No blood products overnight.  Last blood products yesterday evening Low uop  Objective: Vital signs in last 24 hours: Temp:  [93.4 F (34.1 C)-99.5 F (37.5 C)] 99 F (37.2 C) (04/18 0730) Pulse Rate:  [79-122] 79 (04/18 0730) Resp:  [8-23] 21 (04/18 0730) SpO2:  [90 %-100 %] 96 % (04/18 0735) Arterial Line BP: (74-166)/(32-63) 107/46 (04/18 0729) FiO2 (%):  [40 %-55 %] 50 % (04/18 0735) Last BM Date : 01/16/24  Intake/Output from previous day: 04/17 0701 - 04/18 0700 In: 14604.9 [I.V.:9606; WUJWJ:1914; IV Piggyback:342.9] Out: 4063 [Urine:153; Emesis/NG output:200; Drains:2425; Stool:235; Blood:50] Intake/Output this shift: No intake/output data recorded.  Lungs:  nonlabored  Cardiovascular: reg  Abd: distended, soft, abthera wound vac secure - serosang blood in cannister; rectal tube - old blood  Extremities: no edema, +SCDs  Neuro: alert, nonfocal  Lab Results: CBC  Recent Labs    01/16/24 1246 01/16/24 1552 01/17/24 0514 01/17/24 0757  WBC 14.9*  --  19.8*  --   HGB 8.1*   < > 7.3* 7.1*  HCT 26.4*   < > 20.3* 19.8*  PLT 80*  80*   < > 152 162   < > = values in this interval not displayed.   BMET Recent Labs    01/16/24 0427 01/16/24 0911 01/17/24 0514  NA 141 140 139  K 6.0* 4.7 5.0  CL 105  --  97*  CO2 18*  --  23  GLUCOSE 186*  --  104*  BUN 35*  --  52*  CREATININE 2.78*  --  3.88*  CALCIUM  6.9*  --  5.9*   LFT    Latest Ref Rng & Units 01/16/2024    4:27 AM 01/15/2024    8:43 PM 01/15/2024    3:54 PM  Hepatic Function  Total Protein 6.5 - 8.1 g/dL 3.7  5.2  5.3   Albumin  3.5 - 5.0 g/dL 2.2  3.0  2.6   AST 15 - 41 U/L 90  24  21   ALT 0 - 44 U/L 55  15  17   Alk Phosphatase 38 - 126 U/L 20  27  33   Total Bilirubin 0.0 - 1.2 mg/dL 0.9  0.8  0.8    PT/INR Recent Labs     01/17/24 0514 01/17/24 0757  LABPROT 25.0* 23.3*  INR 2.2* 2.0*   ABG Recent Labs    01/16/24 0911 01/16/24 1528  PHART 7.038* 7.25*  HCO3 13.6* 17.5*    Studies/Results:  Anti-infectives: Anti-infectives (From admission, onward)    Start     Dose/Rate Route Frequency Ordered Stop   01/12/24 0200  piperacillin -tazobactam (ZOSYN ) IVPB 3.375 g        3.375 g 12.5 mL/hr over 240 Minutes Intravenous Every 8 hours 01/11/24 2120     01/11/24 1815  piperacillin -tazobactam (ZOSYN ) IVPB 3.375 g        3.375 g 100 mL/hr over 30 Minutes Intravenous  Once 01/11/24 1806 01/11/24 1921       Medications: Scheduled Meds:  sodium chloride    Intravenous Once   sodium chloride    Intravenous Once   sodium chloride    Intravenous Once   sodium chloride    Intravenous Once   sodium chloride    Intravenous Once   Chlorhexidine  Gluconate Cloth  6 each Topical Daily  furosemide   40 mg Intravenous Once   hydrocortisone  sod succinate (SOLU-CORTEF ) inj  100 mg Intravenous Q6H   mupirocin  ointment  1 Application Nasal BID   mouth rinse  15 mL Mouth Rinse Q2H   Continuous Infusions:  calcium  gluconate Stopped (01/15/24 2127)   calcium  gluconate 2,000 mg (01/17/24 0756)   epinephrine  Stopped (01/16/24 2222)   fentaNYL  infusion INTRAVENOUS 175 mcg/hr (01/17/24 0700)   furosemide  (LASIX ) 200 mg in dextrose  5 % 100 mL (2 mg/mL) infusion     methylene blue  (antidote) 161 mg in dextrose  5 % 50 mL IVPB     norepinephrine  (LEVOPHED ) Adult infusion 26 mcg/min (01/17/24 0700)   phenylephrine  (NEO-SYNEPHRINE) Adult infusion 130 mcg/min (01/17/24 0749)   piperacillin -tazobactam (ZOSYN )  IV 12.5 mL/hr at 01/17/24 0700   propofol  (DIPRIVAN ) infusion 50 mcg/kg/min (01/17/24 0847)   prothrombin  complex conc human (KCENTRA ) IVPB 2,254 Units     sodium bicarbonate  150 mEq in sterile water  1,150 mL infusion 125 mL/hr at 01/17/24 0806   vasopressin  0.04 Units/min (01/17/24 0845)   PRN Meds:.fentaNYL ,  fentaNYL  (SUBLIMAZE ) injection, fentaNYL  (SUBLIMAZE ) injection, labetalol , mouth rinse, mouth rinse  Assessment/Plan: Patient Active Problem List   Diagnosis Date Noted   Generalized abdominal pain 01/15/2024   Spontaneous perforation of colon (HCC) 01/15/2024   Acute hypoxic respiratory failure (HCC) 01/15/2024   Ascites 01/15/2024   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 01/15/2024   Coagulopathy (HCC) 01/15/2024   Hematochezia 01/14/2024   Acute blood loss anemia 01/14/2024   Rectal bleeding 01/14/2024   ABLA (acute blood loss anemia) 01/13/2024   Iron  deficiency anemia 01/12/2024   Diverticulitis of colon 01/12/2024   History of pulmonary embolism 01/11/2024   Diverticulitis 01/11/2024   Symptomatic anemia 01/11/2024   AKI (acute kidney injury) (HCC) 01/11/2024   Lower GI bleed 01/11/2024   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   Thrombocytopenia (HCC) 07/13/2023   Pulmonary hypertension (HCC) 07/12/2023   Cor pulmonale (HCC) 07/12/2023   Noncompliance 01/22/2020   Impaired fasting blood sugar 01/22/2020   Chronic diarrhea 01/22/2020   High risk medication use 10/30/2019   S/P surgical manipulation of ankle joint 10/30/2019   Hyperlipidemia 10/30/2019   Hepatic steatosis 10/08/2019   Spinal stenosis 10/08/2019   Aortic atherosclerosis (HCC) 10/08/2019   Peripheral arterial disease (HCC) 10/08/2019   Sleep disturbance 10/08/2019   Diverticulosis 10/08/2019   Pulmonary embolus (HCC) 09/29/2019   Essential hypertension, benign 09/28/2019   Gout 09/28/2019   History of pulmonary embolus (PE) 04/20/2019   Chronic pain of right ankle 12/12/2016   Chronic foot pain, right 11/02/2012   Elevated uric acid in blood 09/21/2011   Chronic GERD 09/21/2011    GI bleed; Rt colon perforation s/p Procedure(s): LAPAROTOMY, EXPLORATORY RIGHT COLECTOMY (without anastomosis) PLACEMENT OF ABTHERA WOUND VAC DRAINAGE OF INTRA-ABDOMINAL ABSCESS  4/16  EW  RE-EXPLORATION ABDOMEN, PLACEMENT OF ABTHERA WOUND VAC, SMALL BOWEL RESECTION 01/16/2024 EW  Bowel in discontinuity, cont abthera vac  Septic shock - on neo, levo, vaso - per CCM ABL anemia - hgb seems stable at 7; rec serial cbc/coags; transfuse to keep hgb>7 Coagulopathy - INR still above 2. Will transfuse 1u FFP this am; DIC panel pending; cont to correct coagulopathy AKI - low uop, Cr up, 23L +, defer to CCM ID - cont broad spectrum IV abx Acute hypoxemic resp failure - per CCM   H/o VTE/PE - scds, only, hold coumadin    Disposition: updated son at [facility]; rediscussed code status with him, rec DNR  but continue active treatment; if it is actively bleeding tomorrow, will plan return to OR for washout, end ileostomy, abd closure;  Data Reviewed: vitals, labs, x24 hrs, progress notes x 24hrs, discussion of care with CCM attending;   CC time 30 minutes  LOS: 6 days    Marianna Shirk. Elvan Hamel, MD, FACS General, Bariatric, & Minimally Invasive Surgery [phone] [facility], [facility]

## 2024-01-18 ENCOUNTER — Inpatient Hospital Stay (HOSPITAL_COMMUNITY): Admitting: Certified Registered"

## 2024-01-18 ENCOUNTER — Other Ambulatory Visit: Payer: Self-pay

## 2024-01-18 ENCOUNTER — Encounter (HOSPITAL_COMMUNITY)
Admission: EM | Disposition: A | Discharge status: Nursing Home (Includes ICF) | Payer: Self-pay | Source: Home / Self Care | Attending: Internal Medicine

## 2024-01-18 ENCOUNTER — Inpatient Hospital Stay (HOSPITAL_COMMUNITY)

## 2024-01-18 ENCOUNTER — Encounter (HOSPITAL_COMMUNITY): Payer: Self-pay | Admitting: Internal Medicine

## 2024-01-18 DIAGNOSIS — I509 Heart failure, unspecified: Secondary | ICD-10-CM

## 2024-01-18 DIAGNOSIS — J9601 Acute respiratory failure with hypoxia: Secondary | ICD-10-CM | POA: Diagnosis not present

## 2024-01-18 DIAGNOSIS — A419 Sepsis, unspecified organism: Secondary | ICD-10-CM | POA: Diagnosis not present

## 2024-01-18 DIAGNOSIS — I110 Hypertensive heart disease with heart failure: Secondary | ICD-10-CM

## 2024-01-18 DIAGNOSIS — Z87891 Personal history of nicotine dependence: Secondary | ICD-10-CM

## 2024-01-18 DIAGNOSIS — Z432 Encounter for attention to ileostomy: Secondary | ICD-10-CM

## 2024-01-18 DIAGNOSIS — N179 Acute kidney failure, unspecified: Secondary | ICD-10-CM | POA: Diagnosis not present

## 2024-01-18 DIAGNOSIS — I469 Cardiac arrest, cause unspecified: Secondary | ICD-10-CM | POA: Diagnosis not present

## 2024-01-18 DIAGNOSIS — K7200 Acute and subacute hepatic failure without coma: Secondary | ICD-10-CM

## 2024-01-18 HISTORY — PX: ILEOSTOMY: SHX1783

## 2024-01-18 HISTORY — PX: APPLICATION OF WOUND VAC: SHX5189

## 2024-01-18 HISTORY — PX: BOWEL RESECTION: SHX1257

## 2024-01-18 HISTORY — PX: LAPAROTOMY: SHX154

## 2024-01-18 LAB — GLUCOSE, CAPILLARY
Glucose-Capillary: 108 mg/dL — ABNORMAL HIGH (ref 70–99)
Glucose-Capillary: 99 mg/dL (ref 70–99)
Glucose-Capillary: 105 mg/dL — ABNORMAL HIGH (ref 70–99)
Glucose-Capillary: 99 mg/dL (ref 70–99)

## 2024-01-18 LAB — CREATININE, URINE, RANDOM: Creatinine, Urine: 16 mg/dL

## 2024-01-18 LAB — CULTURE, BLOOD (ROUTINE X 2)
Culture: NO GROWTH
Culture: NO GROWTH

## 2024-01-18 LAB — PHOSPHORUS: Phosphorus: 9.2 mg/dL — ABNORMAL HIGH (ref 2.5–4.6)

## 2024-01-18 LAB — RENAL FUNCTION PANEL
Albumin: 2 g/dL — ABNORMAL LOW (ref 3.5–5.0)
Anion gap: 20 — ABNORMAL HIGH (ref 5–15)
BUN: 79 mg/dL — ABNORMAL HIGH (ref 8–23)
CO2: 27 mmol/L (ref 22–32)
Calcium: 5.7 mg/dL — CL (ref 8.9–10.3)
Chloride: 89 mmol/L — ABNORMAL LOW (ref 98–111)
Creatinine, Ser: 5.81 mg/dL — ABNORMAL HIGH (ref 0.61–1.24)
GFR, Estimated: 10 mL/min — ABNORMAL LOW (ref >60)
Glucose, Bld: 104 mg/dL — ABNORMAL HIGH (ref 70–99)
Phosphorus: 9.5 mg/dL — ABNORMAL HIGH (ref 2.5–4.6)
Potassium: 5.4 mmol/L — ABNORMAL HIGH (ref 3.5–5.1)
Sodium: 136 mmol/L (ref 135–145)

## 2024-01-18 LAB — BASIC METABOLIC PANEL WITH GFR
Anion gap: 18 — ABNORMAL HIGH (ref 5–15)
BUN: 70 mg/dL — ABNORMAL HIGH (ref 8–23)
CO2: 27 mmol/L (ref 22–32)
Calcium: 5.7 mg/dL — CL (ref 8.9–10.3)
Chloride: 92 mmol/L — ABNORMAL LOW (ref 98–111)
Creatinine, Ser: 5.38 mg/dL — ABNORMAL HIGH (ref 0.61–1.24)
GFR, Estimated: 11 mL/min — ABNORMAL LOW (ref >60)
Glucose, Bld: 108 mg/dL — ABNORMAL HIGH (ref 70–99)
Potassium: 5.4 mmol/L — ABNORMAL HIGH (ref 3.5–5.1)
Sodium: 137 mmol/L (ref 135–145)

## 2024-01-18 LAB — PREPARE RBC (CROSSMATCH)

## 2024-01-18 LAB — HEMOGLOBIN AND HEMATOCRIT, BLOOD
HCT: 22.7 % — ABNORMAL LOW (ref 39.0–52.0)
HCT: 22 % — ABNORMAL LOW (ref 39.0–52.0)
HCT: 26.2 % — ABNORMAL LOW (ref 39.0–52.0)
HCT: 26.2 % — ABNORMAL LOW (ref 39.0–52.0)
HCT: 21 % — ABNORMAL LOW (ref 39.0–52.0)
Hemoglobin: 8 g/dL — ABNORMAL LOW (ref 13.0–17.0)
Hemoglobin: 7.7 g/dL — ABNORMAL LOW (ref 13.0–17.0)
Hemoglobin: 8.9 g/dL — ABNORMAL LOW (ref 13.0–17.0)
Hemoglobin: 9.1 g/dL — ABNORMAL LOW (ref 13.0–17.0)
Hemoglobin: 6.9 g/dL — CL (ref 13.0–17.0)

## 2024-01-18 LAB — URINALYSIS, ROUTINE W REFLEX MICROSCOPIC
Bilirubin Urine: NEGATIVE
Glucose, UA: NEGATIVE mg/dL
Ketones, ur: NEGATIVE mg/dL
Leukocytes,Ua: NEGATIVE
Nitrite: NEGATIVE
Protein, ur: NEGATIVE mg/dL
Specific Gravity, Urine: 1.008 (ref 1.005–1.030)
pH: 7 (ref 5.0–8.0)

## 2024-01-18 LAB — BLOOD GAS, ARTERIAL
Acid-Base Excess: 7.8 mmol/L — ABNORMAL HIGH (ref 0.0–2.0)
Bicarbonate: 30.9 mmol/L — ABNORMAL HIGH (ref 20.0–28.0)
Drawn by: 11249
FIO2: 45 %
MECHVT: 560 mL
O2 Saturation: 100 %
Patient temperature: 36.8
RATE: 20 {breaths}/min
pCO2 arterial: 37 mmHg (ref 32–48)
pH, Arterial: 7.53 — ABNORMAL HIGH (ref 7.35–7.45)
pO2, Arterial: 88 mmHg (ref 83–108)

## 2024-01-18 LAB — DIC (DISSEMINATED INTRAVASCULAR COAGULATION)PANEL
D-Dimer, Quant: 5.77 ug{FEU}/mL — ABNORMAL HIGH (ref 0.00–0.50)
D-Dimer, Quant: 6.52 ug{FEU}/mL — ABNORMAL HIGH (ref 0.00–0.50)
Fibrinogen: 383 mg/dL (ref 210–475)
Fibrinogen: 345 mg/dL (ref 210–475)
INR: 1.6 — ABNORMAL HIGH (ref 0.8–1.2)
INR: 1.4 — ABNORMAL HIGH (ref 0.8–1.2)
Platelets: 148 10*3/uL — ABNORMAL LOW (ref 150–400)
Platelets: 141 10*3/uL — ABNORMAL LOW (ref 150–400)
Prothrombin Time: 19.3 s — ABNORMAL HIGH (ref 11.4–15.2)
Prothrombin Time: 17.2 s — ABNORMAL HIGH (ref 11.4–15.2)
Smear Review: NONE SEEN
Smear Review: NONE SEEN
aPTT: 32 s (ref 24–36)
aPTT: 32 s (ref 24–36)

## 2024-01-18 LAB — SODIUM, URINE, RANDOM: Sodium, Ur: 99 mmol/L

## 2024-01-18 LAB — MAGNESIUM: Magnesium: 2 mg/dL (ref 1.7–2.4)

## 2024-01-18 SURGERY — LAPAROTOMY, EXPLORATORY
Anesthesia: General | Site: Abdomen

## 2024-01-18 MED ORDER — PIPERACILLIN-TAZOBACTAM 3.375 G IVPB 30 MIN
3.3750 g | Freq: Four times a day (QID) | INTRAVENOUS | Status: AC
  Administered 2024-01-18 – 2024-01-23 (×22): 3.375 g via INTRAVENOUS
  Filled 2024-01-18 (×22): qty 50

## 2024-01-18 MED ORDER — SODIUM CHLORIDE 0.9 % IV SOLN: INTRAVENOUS | Status: DC | PRN

## 2024-01-18 MED ORDER — CALCIUM CHLORIDE 10 % IV SOLN
INTRAVENOUS | Status: AC
  Filled 2024-01-18: qty 10

## 2024-01-18 MED ORDER — SODIUM CHLORIDE 0.9 % IV SOLN
4.0000 g | Freq: Once | INTRAVENOUS | Status: AC
  Administered 2024-01-18: 4 g via INTRAVENOUS
  Filled 2024-01-18: qty 40

## 2024-01-18 MED ORDER — LIDOCAINE HCL (PF) 2 % IJ SOLN
INTRAMUSCULAR | Status: AC
  Filled 2024-01-18: qty 5

## 2024-01-18 MED ORDER — PRISMASOL BGK 4/2.5 32-4-2.5 MEQ/L EC SOLN: Status: DC

## 2024-01-18 MED ORDER — EPINEPHRINE 1 MG/10ML IJ SOSY
PREFILLED_SYRINGE | INTRAMUSCULAR | Status: AC
  Filled 2024-01-18: qty 10

## 2024-01-18 MED ORDER — HYDROCORTISONE SOD SUC (PF) 100 MG IJ SOLR: 50.0000 mg | Freq: Four times a day (QID) | INTRAMUSCULAR | Status: DC

## 2024-01-18 MED ORDER — ROCURONIUM BROMIDE 100 MG/10ML IV SOLN
INTRAVENOUS | Status: DC | PRN
  Administered 2024-01-18: 50 mg via INTRAVENOUS
  Administered 2024-01-18: 30 mg via INTRAVENOUS

## 2024-01-18 MED ORDER — FENTANYL CITRATE (PF) 100 MCG/2ML IJ SOLN
INTRAMUSCULAR | Status: AC
Start: 2024-01-18 — End: ?
  Filled 2024-01-18: qty 2

## 2024-01-18 MED ORDER — SODIUM CHLORIDE 0.9 % IV SOLN
500.0000 [IU]/h | INTRAVENOUS | Status: DC
  Administered 2024-01-18 – 2024-01-21 (×5): 500 [IU]/h via INTRAVENOUS_CENTRAL
  Administered 2024-01-22 – 2024-01-23 (×2): 750 [IU]/h via INTRAVENOUS_CENTRAL
  Filled 2024-01-18 (×2): qty 10000
  Filled 2024-01-18: qty 2
  Filled 2024-01-18 (×4): qty 10000

## 2024-01-18 MED ORDER — CALCIUM GLUCONATE-NACL 2-0.675 GM/100ML-% IV SOLN
2.0000 g | Freq: Once | INTRAVENOUS | Status: AC
  Administered 2024-01-18: 2000 mg via INTRAVENOUS
  Filled 2024-01-18: qty 100

## 2024-01-18 MED ORDER — PIPERACILLIN-TAZOBACTAM 3.375 G IVPB: 3.3750 g | Freq: Four times a day (QID) | INTRAVENOUS | Status: DC

## 2024-01-18 MED ORDER — HEPARIN SODIUM (PORCINE) 1000 UNIT/ML DIALYSIS
1000.0000 [IU] | INTRAMUSCULAR | Status: DC | PRN
  Administered 2024-01-18: 2800 [IU] via INTRAVENOUS_CENTRAL
  Administered 2024-01-23: 3000 [IU] via INTRAVENOUS_CENTRAL
  Filled 2024-01-18 (×2): qty 6

## 2024-01-18 MED ORDER — CALCIUM CHLORIDE 10 % IV SOLN
INTRAVENOUS | Status: DC | PRN
  Administered 2024-01-18 (×4): 250 mg via INTRAVENOUS

## 2024-01-18 MED ORDER — SODIUM CHLORIDE 0.9% IV SOLUTION: Freq: Once | INTRAVENOUS | Status: AC

## 2024-01-18 MED ORDER — FENTANYL CITRATE (PF) 100 MCG/2ML IJ SOLN
INTRAMUSCULAR | Status: DC | PRN
  Administered 2024-01-18 (×2): 50 ug via INTRAVENOUS

## 2024-01-18 MED ORDER — EPINEPHRINE PF 1 MG/ML IJ SOLN
INTRAMUSCULAR | Status: AC
  Filled 2024-01-18: qty 1

## 2024-01-18 MED ORDER — SODIUM CHLORIDE 0.9 % IV SOLN
4.0000 g | Freq: Once | INTRAVENOUS | Status: DC
  Filled 2024-01-18: qty 40

## 2024-01-18 MED ORDER — HYDROCORTISONE SOD SUC (PF) 100 MG IJ SOLR
50.0000 mg | Freq: Every day | INTRAMUSCULAR | Status: DC
  Administered 2024-01-19 – 2024-01-20 (×2): 50 mg via INTRAVENOUS
  Filled 2024-01-18 (×3): qty 2

## 2024-01-18 MED ORDER — 0.9 % SODIUM CHLORIDE (POUR BTL) OPTIME
TOPICAL | Status: DC | PRN
  Administered 2024-01-18: 6000 mL

## 2024-01-18 MED ORDER — MIDAZOLAM HCL 2 MG/2ML IJ SOLN
INTRAMUSCULAR | Status: AC
  Filled 2024-01-18: qty 2

## 2024-01-18 MED ORDER — STERILE WATER FOR INJECTION IJ SOLN
INTRAMUSCULAR | Status: DC | PRN
  Administered 2024-01-18: 2 mL via INTRAVENOUS

## 2024-01-18 MED ORDER — VASOPRESSIN 20 UNIT/ML IV SOLN
INTRAVENOUS | Status: AC
  Filled 2024-01-18: qty 1

## 2024-01-18 MED ORDER — LACTATED RINGERS IV SOLN: INTRAVENOUS | Status: DC | PRN

## 2024-01-18 MED ORDER — PHENYLEPHRINE 80 MCG/ML (10ML) SYRINGE FOR IV PUSH (FOR BLOOD PRESSURE SUPPORT)
PREFILLED_SYRINGE | INTRAVENOUS | Status: AC
  Filled 2024-01-18: qty 20

## 2024-01-18 SURGICAL SUPPLY — 46 items
BAG COUNTER SPONGE SURGICOUNT (BAG) IMPLANT
BLADE EXTENDED COATED 6.5IN (ELECTRODE) ×2 IMPLANT
CANISTER WOUND CARE 500ML ATS (WOUND CARE) IMPLANT
CELLS DAT CNTRL 66122 CELL SVR (MISCELLANEOUS) IMPLANT
CLIP TI LARGE 6 (CLIP) IMPLANT
COVER SURGICAL LIGHT HANDLE (MISCELLANEOUS) ×2 IMPLANT
DRAIN CHANNEL 19F RND (DRAIN) IMPLANT
DRAPE DERMATAC (DRAPES) IMPLANT
DRAPE LAPAROSCOPIC ABDOMINAL (DRAPES) IMPLANT
DRSG OPSITE POSTOP 4X6 (GAUZE/BANDAGES/DRESSINGS) IMPLANT
DRSG OPSITE POSTOP 4X8 (GAUZE/BANDAGES/DRESSINGS) IMPLANT
ELECT REM PT RETURN 15FT ADLT (MISCELLANEOUS) ×2 IMPLANT
EVACUATOR SILICONE 100CC (DRAIN) ×2 IMPLANT
GAUZE SPONGE 4X4 12PLY STRL (GAUZE/BANDAGES/DRESSINGS) ×2 IMPLANT
GLOVE BIO SURGEON STRL SZ7.5 (GLOVE) ×2 IMPLANT
GLOVE INDICATOR 8.0 STRL GRN (GLOVE) ×2 IMPLANT
GOWN SPEC L4 XLG W/TWL (GOWN DISPOSABLE) ×2 IMPLANT
KIT TURNOVER KIT A (KITS) IMPLANT
LEGGING LITHOTOMY PAIR STRL (DRAPES) IMPLANT
LIGASURE IMPACT 36 18CM CVD LR (INSTRUMENTS) IMPLANT
NS IRRIG 1000ML POUR BTL (IV SOLUTION) ×4 IMPLANT
PACK GENERAL/GYN (CUSTOM PROCEDURE TRAY) ×2 IMPLANT
RELOAD PROXIMATE 75MM BLUE (ENDOMECHANICALS) ×2 IMPLANT
RELOAD STAPLE 75 3.8 BLU REG (ENDOMECHANICALS) IMPLANT
RETRACTOR WND ALEXIS 18 MED (MISCELLANEOUS) IMPLANT
RTRCTR WOUND ALEXIS 18CM MED (MISCELLANEOUS) IMPLANT
SHEARS FOC LG CVD HARMONIC 17C (MISCELLANEOUS) IMPLANT
SHEARS HARMONIC 36 ACE (MISCELLANEOUS) IMPLANT
STAPLER PROXIMATE 75MM BLUE (STAPLE) IMPLANT
STAPLER SKIN PROX WIDE 3.9 (STAPLE) ×2 IMPLANT
SUT ETHILON 2 0 PS N (SUTURE) IMPLANT
SUT NOVA 1 T20/GS 25DT (SUTURE) IMPLANT
SUT NOVA NAB DX-16 0-1 5-0 T12 (SUTURE) IMPLANT
SUT PDS AB 1 TP1 96 (SUTURE) IMPLANT
SUT PDS AB 3-0 SH 27 (SUTURE) ×2 IMPLANT
SUT PDS AB 4-0 SH 27 (SUTURE) IMPLANT
SUT PROLENE 2 0 BLUE (SUTURE) IMPLANT
SUT SILK 2 0 SH CR/8 (SUTURE) ×4 IMPLANT
SUT SILK 2 0SH CR/8 30 (SUTURE) IMPLANT
SUT SILK 2-0 18XBRD TIE 12 (SUTURE) ×4 IMPLANT
SUT SILK 2-0 30XBRD TIE 12 (SUTURE) IMPLANT
SUT SILK 3 0 SH CR/8 (SUTURE) ×4 IMPLANT
SUT SILK 3-0 18XBRD TIE 12 (SUTURE) ×4 IMPLANT
SUT VIC AB 3-0 SH 18 (SUTURE) IMPLANT
TOWEL OR 17X26 10 PK STRL BLUE (TOWEL DISPOSABLE) ×2 IMPLANT
TRAY FOLEY MTR SLVR 16FR STAT (SET/KITS/TRAYS/PACK) ×2 IMPLANT

## 2024-01-18 NOTE — Consult Note (Addendum)
 Renal Service Consult Note [facility]  Alexander [name] 01/18/2024 Lynae Sandifer, MD Requesting Physician: Dr. Gaynell Keeler  Reason for Consult: Renal failure HPI: The patient is a 63 y.o. year-old w/ PMH as below who presented on 01/11/24 w/ BRBPR, weakness and syncope, abd pain. In ED pt had large bloody BM, was tahcy and [facility]. CT showed diverticulitis. Admitted on started on IV zosyn . On 4/16 went to OR per gen surgery and underwent exlap, R colectomy (ascending colon was perforated) and drainage of abdominal abscess. On 4/17 had cardioresp arrest, achieved ROSC. 2nd stage surgery was done 4/18 w/ exlap, creation of ileostomy, small bowel resection and wound vac placement. Pt remains on pressors x 2 (was on 4 yest), unresponsive. Creat was stable at 0.97 on 4/16, since then has been rising up to 5.3 yest and 5.81 today. We are asked to see for CRRT.    Pt seen in ICU. Unresponsive and sedated.  I/O: 43 L and 15 L out = + 28 L Urine output: Urine output until 4/16, did make 270 cc of urine today Weights: Weights are up from 81 kg early this admission up to 95 kg again of 14 kg Blood pressure: On 4/16 patient was started on 4 presssors- epi, levo, neo and vaso gtts. Today is down to levo and vaso.  Renal-related meds:  Lasix  40 on 4/18 IV Albumin  IV on 4/16, 4/17 Lasix  drip started on 4/18 IV Zosyn  started 4/13 to current IV contrast (Omnipaque ) 100 cc on 4/12 and 100 cc on 4/15  ROS - n/a  PMH: Acute pulmonary embolism 2020 History of DVT History of diverticulitis Gout Hypertension Overweight PAD Pulmonary hypertension  Past Surgical History  Past Surgical History:  Procedure Laterality Date   FOOT ARTHROPLASTY Right    HEMORRHOID SURGERY     IR ANGIOGRAM PULMONARY BILATERAL SELECTIVE  07/12/2023   IR ANGIOGRAM SELECTIVE EACH ADDITIONAL VESSEL  07/12/2023   IR ANGIOGRAM SELECTIVE EACH ADDITIONAL VESSEL  07/12/2023   IR INFUSION THROMBOL ARTERIAL INITIAL (MS)   07/12/2023   IR INFUSION THROMBOL ARTERIAL INITIAL (MS)  07/12/2023   IR THROMB F/U EVAL ART/VEN FINAL DAY (MS)  07/13/2023   IR US  GUIDE VASC ACCESS RIGHT  07/12/2023   LAPAROTOMY N/A 01/15/2024   Procedure: LAPAROTOMY, EXPLORATORY DRAINAGE INTRA- ABDOMINAL ABCESS RIGHT COLECTOMY PLACEMENT OF ABTHERA WOUND VAC;  Surgeon: [name], MD;  Location: [facility];  Service: General;  Laterality: N/A;  BOWEL RESECTION, POSSIBLE COLOSTOMY   LAPAROTOMY N/A 01/16/2024   Procedure: RE-EXPLORATION ABDOMEN, PLACEMENT OF ABTHERA WOUND VAC, SMALL BOWEL RESECTION;  Surgeon: [name], MD;  Location: [facility];  Service: General;  Laterality: N/A;  Possible SBR, Change abdominal vac   Family History  Family History  Problem Relation Age of Onset   Hypertension Mother    Gout Mother    Heart disease Mother        died of MI   Stroke Mother    Hypertension Father    Cancer Father        died of brain tumor   Hypertension Sister    Hypertension Brother    Diabetes Brother    Diabetes Maternal Aunt    Diabetes Paternal Aunt    Hypertension Brother    Hypertension Brother    Diabetes Brother    Hypertension Sister    Asthma Sister    Colon cancer Neg Hx    Prostate cancer Neg Hx    Social History  reports that he  has quit smoking. His smoking use included cigarettes. He has never used smokeless tobacco. He reports current alcohol use of about 3.0 standard drinks of alcohol per week. He reports that he does not use drugs. Allergies No Known Allergies Home medications Prior to Admission medications   Medication Sig Start Date End Date Taking? Authorizing Provider  acetaminophen  (TYLENOL ) 325 MG tablet Take 2 tablets (650 mg total) by mouth every 6 (six) hours as needed for mild pain (pain score 1-3) (or Fever >/= 101). 07/23/23  Yes Sheikh, Omair Latif, DO  warfarin (COUMADIN ) 5 MG tablet TAKE 2 TABLETS BY MOUTH DAILY OR AS DIRECTED BY ANTICOAGULATION CLINIC Patient taking differently: Take 10 mg by  mouth daily at 4 PM. 12/24/23  Yes Alexander Iba, PA  mupirocin  ointment (BACTROBAN ) 2 % Apply to affected area 1-2 times daily Patient not taking: Reported on 01/11/2024 12/03/23   Worley, Samantha, PA  polyethylene glycol powder (GLYCOLAX /MIRALAX ) 17 GM/SCOOP powder Mix as directed and take 17 g by mouth daily. Patient not taking: Reported on 01/11/2024 07/23/23   Aura Leeds Latif, DO     Vitals:   01/18/24 0600 01/18/24 0715 01/18/24 0745 01/18/24 1000  BP:      Pulse: 67 64    Resp: 18 20    Temp:  (!) 97.5 F (36.4 C)    TempSrc:      SpO2: 99% 99% 100% 96%  Weight:      Height:       Exam Gen on vent, sedated No rash, cyanosis or gangrene Sclera anicteric, throat w/ ETT No jvd or bruits Chest clear anterior/ lateral RRR no MRG Abd ileostomy R mid abdomen, wound vac in place in midline GU foley draining normal appearing urine MS no joint effusions or deformity Ext diffuse LE and UE 2+ edema, including scrotum Neuro is on vent, sedated    Renal-related home meds: Others: coumadin , miralax    UA 01/12/24 - negative, no protein  UNa 140, UCr 90      Assessment/ Plan: AKI - mostly anuric, creat 1.0 early in this hospital stay. After 4/16 creat up and rising steadily; this corresponded w/ the initiation of pressor support (x 4) for severe shock. UA 4/13 was negative. AKI due to shock primarily, also IV contrast given x 2 since admission. Pt needs CRRT, started this morning. Get renal US  as well and repeat UA, urine lytes. Will follow.  S/P cardiac arrest  Shock/ hypotension: on 2 pressors today.  Metabolic acidosis: rx'ing w/ IV bicarb and with CRRT Acute diverticulitis: w/ perforated ascending colon, now s/o ileostomy and R hemicolectomy. Per gen surg/ pmd.  VDRF: per CCM      Larry Poag  MD [facility] 01/18/2024, 10:59 AM  Recent Labs  Lab 01/16/24 0427 01/16/24 0911 01/17/24 2130 01/17/24 0757 01/17/24 1702 01/17/24 2222 01/18/24 0347 01/18/24 0424  01/18/24 0726  HGB 4.0*   < > 7.3*   < > 8.3*   < > 8.0*  --  7.7*  ALBUMIN  2.2*  --   --   --  2.2*  --   --   --   --   CALCIUM  6.9*  --  5.9*  --  5.8*  --   --  5.7*  --   PHOS 8.3*  --  7.7*  --   --   --   --  9.2*  --   CREATININE 2.78*  --  3.88*  --  4.72*  --   --  5.38*  --   K 6.0*   < > 5.0  --  5.2*  --   --  5.4*  --    < > = values in this interval not displayed.   Inpatient medications:  sodium chloride    Intravenous Once   sodium chloride    Intravenous Once   sodium chloride    Intravenous Once   Chlorhexidine  Gluconate Cloth  6 each Topical Daily   hydrocortisone  sod succinate (SOLU-CORTEF ) inj  100 mg Intravenous Q6H   mouth rinse  15 mL Mouth Rinse Q2H    epinephrine  Stopped (01/16/24 2222)   fentaNYL  infusion INTRAVENOUS 125 mcg/hr (01/18/24 0749)   furosemide  (LASIX ) 200 mg in dextrose  5 % 100 mL (2 mg/mL) infusion 8 mg/hr (01/18/24 0749)   methylene blue  (antidote) 161 mg in dextrose  5 % 50 mL IVPB     norepinephrine  (LEVOPHED ) Adult infusion 6 mcg/min (01/18/24 0955)   phenylephrine  (NEO-SYNEPHRINE) Adult infusion Stopped (01/17/24 1213)   piperacillin -tazobactam (ZOSYN )  IV 0 g (01/18/24 0627)   propofol  (DIPRIVAN ) infusion 50 mcg/kg/min (01/18/24 0749)   sodium bicarbonate  150 mEq in sterile water  1,150 mL infusion 125 mL/hr at 01/18/24 0432   vasopressin  0.04 Units/min (01/18/24 0749)   fentaNYL , fentaNYL  (SUBLIMAZE ) injection, fentaNYL  (SUBLIMAZE ) injection, labetalol , mouth rinse, mouth rinse

## 2024-01-18 NOTE — Progress Notes (Signed)
 NAME:  Alexander [name], MRN:  [id number], DOB:  01/07/1961, LOS: 7 ADMISSION DATE:  01/11/2024, CONSULTATION DATE:  01/15/24 REFERRING MD:  Dr Elvan Hamel, CHIEF COMPLAINT:  vent management   History of Present Illness:  Pt is encephelopathic; therefore, this HPI is obtained from chart review. Alexander [name] is a 63 y.o. male who has a PMH as outlined below including a limited to PE/DVT on Coumadin .  He was admitted to [facility] on 01/11/2024 with rectal bleeding and hypotension.  Symptoms had started 1 day earlier and had persisted.   CT in the ED was concerning for diverticulitis versus colitis versus neoplasm.  He was started on Zosyn  and his Coumadin  was held.    He was evaluated by gastroenterology who recommended antibiotic therapy and conservative management in the interim with possible EGD/colonoscopy at a later date (possibly 4 to 6 weeks after completion of 10 days of antibiotics alone as he did not become transfusion dependent).  Unfortunately after his admission, he required multiple transfusions for persistent anemia and bleeding.  He had repeat CT scan on 4/15 that demonstrated ongoing inflammation and a large contained perforation without abscess and pneumoperitoneum, increased gaseous distention of small and large bowel distention consistent with ileus.  He was evaluated by general surgery who initially opted for conservative management.  Unfortunately he had ongoing abdominal pain and unfortunately 16, decision was made to go to the OR for further evaluation management.   He was taken to the OR and had an exploratory laparotomy where he was found to have a perforated ascending colon with intra-abdominal abscess and ultimately underwent with right colectomy without anastomosis, placement of wound VAC, and drainage of intra-abdominal abscesses.  He was left open and plan was for return trip to the OR on Friday/18.   Return to the ICU on the ventilator, and PCCM was asked to assist with ventilator and  medical management.  Pertinent  Medical History  has Elevated uric acid in blood; Chronic GERD; Chronic foot pain, right; Chronic pain of right ankle; Acute pulmonary embolism (HCC); DVT (deep venous thrombosis) (HCC); Essential hypertension, benign; Gout; Pulmonary embolus (HCC); Hepatic steatosis; Spinal stenosis; Aortic atherosclerosis (HCC); Peripheral arterial disease (HCC); Sleep disturbance; Diverticulosis; High risk medication use; S/P surgical manipulation of ankle joint; Hyperlipidemia; Noncompliance; Impaired fasting blood sugar; Chronic diarrhea; Pulmonary hypertension (HCC); Cor pulmonale (HCC); Chronic thromboembolic disease (HCC); Thrombocytopenia (HCC); History of pulmonary embolism; Diverticulitis; Symptomatic anemia; AKI (acute kidney injury) (HCC); Lower GI bleed; Iron  deficiency anemia; Diverticulitis of colon; ABLA (acute blood loss anemia); Hematochezia; Acute blood loss anemia; Rectal bleeding; Generalized abdominal pain; and Colon perforation (HCC) on their problem list.   Significant Hospital Events: Including procedures, antibiotic start and stop dates in addition to other pertinent events   4/14 admit 4/16 to OR for ex lap, returned to ICU on vent 4/17 cardiorespiratory arrest, achieved ROSC.  Coagulopathy 4/18 back to the OR 4/19 remains encephalopathic, 4/19 encephalopathic Back to the OR this morning-s/p exploratory laparotomy, ileostomy creation, small bowel resection, wound VAC in place  Antibiotics - Zosyn  4/12>>  Interim History / Subjective:  No overnight events Remains very sick, unresponsive  remains on pressors Tolerated OR today   Objective   Blood pressure (!) 132/51, pulse 64, temperature (!) 97.5 F (36.4 C), resp. rate 20, height 5\' 9"  (1.753 m), weight 80.7 kg, SpO2 96%.    Vent Mode: PRVC FiO2 (%):  [45 %] 45 % Set Rate:  [20 bmp] 20 bmp Vt Set:  [560 mL] 560  mL PEEP:  [5 cmH20] 5 cmH20 Plateau Pressure:  [14 cmH20-20 cmH20] 20 cmH20    Intake/Output Summary (Last 24 hours) at 01/18/2024 1235 Last data filed at 01/18/2024 1120 Gross per 24 hour  Intake 5502.37 ml  Output 1845 ml  Net 3657.37 ml   Filed Weights   01/11/24 1435 01/15/24 0922  Weight: 80.7 kg 80.7 kg    Examination: General: Middle-age, does not appear to be in distress, jaundice HENT: Moist oral mucosa, endotracheal tube in place Lungs: Coarse breath sounds Cardiovascular: S1-S2 appreciated Abdomen: Postsurgical, open abdomen, wound VAC in place Extremities: No clubbing, does have edema Neuro: Unresponsive GU: poor output  I reviewed nursing notes, Consultant notes, hospitalist notes, last 24 h vitals and pain scores, last 48 h intake and output, last 24 h labs and trends, and last 24 h imaging results.  Resolved Hospital Problem list     Assessment & Plan:   Post cardiorespiratory arrest 01/15/24 Coagulopathy-corrected Shock -Pressor requirement better  Transfusion events so far has had 11 packed red blood cells 1 platelet 5 FFP 2 cryoprecipitate Kcentra -01/17/2024  Acute kidney injury - Anuria - He has made about 100 cc in the last couple of hours - He is on a Lasix  drip, did receive Diuril  01/17/2024 Consulted renal - HD catheter placed today for CRRT  Sepsis secondary to intra-abdominal catastrophe - Continue Zosyn   Acute hypoxemic respiratory failure - Continue full vent support - Pulmonary toileting  Shock liver/transaminitis,/hepatic congestion - Continue to monitor  Hypocalcemia - Repleting  Anticoagulation remains on hold  Left femoral head AVN necrosis - Continue to follow   He remains critically ill Discussed with Dr. Elvan Hamel Ongoing family discussions Goals of care discussions Prognosis is poor-this has been relayed to family on several occasions -They still desire full scope of care at present  Significantly fluid positive, hopefully will be able to address this with CRRT  Will continue to make all  attempts to wean down sedation   Best Practice (right click and "Reselect all SmartList Selections" daily)   Diet/type: NPO DVT prophylaxis SCD Pressure ulcer(s): N/A GI prophylaxis: PPI Lines: Central line Foley:  Yes, and it is still needed Code Status:  full code Last date of multidisciplinary goals of care discussion [discussed with son at bedside and addressed for celebration on gradually.]  Labs   CBC: Recent Labs  Lab 01/15/24 0254 01/15/24 0940 01/15/24 2043 01/15/24 2045 01/16/24 0427 01/16/24 0911 01/16/24 1246 01/16/24 1552 01/17/24 0514 01/17/24 0757 01/17/24 1457 01/17/24 1702 01/17/24 2222 01/17/24 2223 01/18/24 0230 01/18/24 0347 01/18/24 0726 01/18/24 1054  WBC 18.6*   < > 9.8  --  19.7*  --  14.9*  --  19.8*  --   --  20.6*  --   --   --   --   --   --   NEUTROABS 15.4*  --   --   --  16.6*  --  12.4*  --  16.7*  --   --  16.9*  --   --   --   --   --   --   HGB 8.4*   < > 7.2*   < > 4.0*   < > 8.1*   < > 7.3*   < > 8.4* 8.3* 8.0*  --   --  8.0* 7.7* 8.9*  HCT 26.7*   < > 23.1*   < > 12.7*   < > 26.4*   < > 20.3*   < >  24.5* 23.8* 23.0*  --   --  22.7* 22.0* 26.2*  MCV 80.7   < > 87.5  --  92.0  --  93.6  --  85.3  --   --  85.0  --   --   --   --   --   --   PLT 320   < > 239   < > 134*  127*  --  80*  80*   < > 152   < > 159 159  --  158 148*  --  141*  --    < > = values in this interval not displayed.    Basic Metabolic Panel: Recent Labs  Lab 01/11/24 1957 01/12/24 0056 01/12/24 1044 01/13/24 0450 01/14/24 0557 01/15/24 0254 01/15/24 1554 01/15/24 2043 01/16/24 0427 01/16/24 0911 01/17/24 0514 01/17/24 1702 01/18/24 0424  NA 134*   < >  --    < > 137 139   < > 137 141 140 139 137 137  K 3.6   < >  --    < > 3.9 3.5   < > 4.1 6.0* 4.7 5.0 5.2* 5.4*  CL 106   < >  --    < > 107 102   < > 107 105  --  97* 93* 92*  CO2 20*   < >  --    < > 23 26   < > 19* 18*  --  23 25 27   GLUCOSE 129*   < >  --    < > 100* 115*   < > 251* 186*  --   104* 108* 108*  BUN 17   < >  --    < > 24* 24*   < > 36* 35*  --  52* 61* 70*  CREATININE 1.08   < >  --    < > 1.09 0.97   < > 1.86* 2.78*  --  3.88* 4.72* 5.38*  CALCIUM  7.7*   < >  --    < > 8.1* 8.4*   < > 10.8* 6.9*  --  5.9* 5.8* 5.7*  MG 2.1   < >  --    < > 2.2 2.4  --   --  2.4  --  2.0  --  2.0  PHOS 2.7  --  2.6  --   --   --   --   --  8.3*  --  7.7*  --  9.2*   < > = values in this interval not displayed.   GFR: Estimated Creatinine Clearance: 14.4 mL/min (A) (by C-G formula based on SCr of 5.38 mg/dL (H)). Recent Labs  Lab 01/11/24 1957 01/12/24 0056 01/13/24 1234 01/13/24 1624 01/15/24 2042 01/15/24 2043 01/16/24 0427 01/16/24 1246 01/17/24 0514 01/17/24 1702  PROCALCITON 0.94  --   --   --   --   --   --   --   --   --   WBC 14.7*   < >  --    < >  --    < > 19.7* 14.9* 19.8* 20.6*  LATICACIDVEN  --    < > 1.2  --  4.4*  --  >9.0*  --  7.0*  --    < > = values in this interval not displayed.    Liver Function Tests: Recent Labs  Lab 01/12/24 0056 01/15/24 1554 01/15/24 2043 01/16/24 0427 01/17/24 1702  AST 16 21  24 90* 5,081*  ALT 7 17 15  55* 2,239*  ALKPHOS 51 33* 27* 20* 115  BILITOT 0.7 0.8 0.8 0.9 2.9*  PROT 7.0 5.3* 5.2* 3.7* 4.0*  ALBUMIN  3.1* 2.6* 3.0* 2.2* 2.2*   No results for input(s): "LIPASE", "AMYLASE" in the last 168 hours. No results for input(s): "AMMONIA" in the last 168 hours.  ABG    Component Value Date/Time   PHART 7.53 (H) 01/18/2024 0339   PCO2ART 37 01/18/2024 0339   PO2ART 88 01/18/2024 0339   HCO3 30.9 (H) 01/18/2024 0339   TCO2 15 (L) 01/16/2024 0911   ACIDBASEDEF 9.2 (H) 01/16/2024 1528   O2SAT 100 01/18/2024 0339     Coagulation Profile: Recent Labs  Lab 01/17/24 0757 01/17/24 1457 01/17/24 2223 01/18/24 0230 01/18/24 0726  INR 2.0* 1.6* 1.7* 1.6* PENDING    Cardiac Enzymes: Recent Labs  Lab 01/11/24 1957  CKTOTAL 87    HbA1C: Hgb A1c MFr Bld  Date/Time Value Ref Range Status  12/03/2023  02:37 PM 5.9 4.6 - 6.5 % Final    Comment:    Glycemic Control Guidelines for People with Diabetes:Non Diabetic:  <6%Goal of Therapy: <7%Additional Action Suggested:  >8%   01/22/2020 11:37 AM 5.2 4.8 - 5.6 % Final    Comment:             Prediabetes: 5.7 - 6.4          Diabetes: >6.4          Glycemic control for adults with diabetes: <7.0     CBG: Recent Labs  Lab 01/17/24 1110 01/17/24 1554 01/17/24 2037 01/17/24 2341 01/18/24 0440  GLUCAP 91 107* 104* 115* 108*    Review of Systems:     Past Medical History:  He,  has a past medical history of Acute pulmonary embolism (HCC) (09/28/2019), Allergy, Aortic atherosclerosis (HCC) (09/2019), Arthritis, Chronic thromboembolic disease (HCC) (07/13/2023), Diverticulitis (2012), DVT (deep venous thrombosis) (HCC) (2019), DVT (deep venous thrombosis) (HCC) (09/2019), Gout (2007), History of gastroesophageal reflux (GERD), Hypertension (10/2019), Overweight (BMI 25.0-29.9), PAD (peripheral artery disease) (HCC) (09/2019), PE (pulmonary thromboembolism) (HCC) (09/2019), Pulmonary embolism (HCC) (2019), and Pulmonary hypertension (HCC) (07/12/2023).   Surgical History:   Past Surgical History:  Procedure Laterality Date   FOOT ARTHROPLASTY Right    HEMORRHOID SURGERY     IR ANGIOGRAM PULMONARY BILATERAL SELECTIVE  07/12/2023   IR ANGIOGRAM SELECTIVE EACH ADDITIONAL VESSEL  07/12/2023   IR ANGIOGRAM SELECTIVE EACH ADDITIONAL VESSEL  07/12/2023   IR INFUSION THROMBOL ARTERIAL INITIAL (MS)  07/12/2023   IR INFUSION THROMBOL ARTERIAL INITIAL (MS)  07/12/2023   IR THROMB F/U EVAL ART/VEN FINAL DAY (MS)  07/13/2023   IR US  GUIDE VASC ACCESS RIGHT  07/12/2023   LAPAROTOMY N/A 01/15/2024   Procedure: LAPAROTOMY, EXPLORATORY DRAINAGE INTRA- ABDOMINAL ABCESS RIGHT COLECTOMY PLACEMENT OF ABTHERA WOUND VAC;  Surgeon: [name], MD;  Location: [facility];  Service: General;  Laterality: N/A;  BOWEL RESECTION, POSSIBLE COLOSTOMY   LAPAROTOMY N/A  01/16/2024   Procedure: RE-EXPLORATION ABDOMEN, PLACEMENT OF ABTHERA WOUND VAC, SMALL BOWEL RESECTION;  Surgeon: [name], MD;  Location: [facility];  Service: General;  Laterality: N/A;  Possible SBR, Change abdominal vac     Social History:   reports that he has quit smoking. His smoking use included cigarettes. He has never used smokeless tobacco. He reports current alcohol use of about 3.0 standard drinks of alcohol per week. He reports that he does not use drugs.   Family  History:  His family history includes Asthma in his sister; Cancer in his father; Diabetes in his brother, brother, maternal aunt, and paternal aunt; Gout in his mother; Heart disease in his mother; Hypertension in his brother, brother, brother, father, mother, sister, and sister; Stroke in his mother. There is no history of Colon cancer or Prostate cancer.   Allergies No Known Allergies   The patient is critically ill with multiple organ systems failure and requires high complexity decision making for assessment and support, frequent evaluation and titration of therapies, application of advanced monitoring technologies and extensive interpretation of multiple databases. Critical Care Time devoted to patient care services described in this note independent of APP/resident time (if applicable)  is 45 minutes.   Myer Artis MD [facility] Personal pager: See Amion If unanswered, please page CCM On-call: #[phone]

## 2024-01-18 NOTE — Progress Notes (Signed)
 Nutrition Follow-up  INTERVENTION:   -Needs adjusted below given pt is starting CRRT  -If unable to initiate enteral nutrition, would recommend initiation of TPN. Pt with no nutrition for 7 days now and now has increased needs. -Patient will be at risk of refeeding syndrome -Recommend 100 mg Thiamine  for at least 5 days  NUTRITION DIAGNOSIS:   Inadequate oral intake related to inability to eat as evidenced by NPO status.  Ongoing.  GOAL:   Patient will meet greater than or equal to 90% of their needs  Not meeting.  MONITOR:   Vent status, Labs, Weight trends  ASSESSMENT:   63 y.o. male who has a PMH including HTN, PAD, diverticulitis who was admitted with rectal bleeding and hypotension.  4/12 Admit 4/13 CLD 4/15 CT a/p showing suspicion for contained colon perforation and ileus 4/16 OR for ex-lap, right colectomy, wound vac placement; returned to ICU intubated 4/17 OR for re-exploration of abdomen, wound vac placement, small bowel resection, left in discontinuity  4/19 s/p ex lap, ileostomy creation, SB resection, Wound VAC  RD consulted by nephrology, pt starting CRRT today.   Patient is currently intubated on ventilator support MV: 11.2 L/min Temp (24hrs), Avg:98.1 F (36.7 C), Min:97.5 F (36.4 C), Max:98.6 F (37 C)  Propofol : 24.2 ml/hr -Provides ~638 fat kcals   Admission weight: 177 lbs. Needs updated weight.   Medications: Calcium  gluconate, Fentanyl , Lasix , Levophed , Vasopressin    Labs reviewed:  CBGs: 108 Elevated K (5.4) Elevated Phos (9.2)  Diet Order:   Diet Order             Diet NPO time specified Except for: Ice Chips  Diet effective now                   EDUCATION NEEDS:   Education needs have been addressed  Skin:  Skin Assessment: Skin Integrity Issues: Skin Integrity Issues:: Incisions, Other (Comment) Incisions: Abdomen Other: Several skin tears  Last BM:  4/19 -rectal tube  Height:   Ht Readings from Last 1  Encounters:  01/15/24 5\' 9"  (1.753 m)    Weight:   Wt Readings from Last 1 Encounters:  01/15/24 80.7 kg    Ideal Body Weight:  72.73 kg  BMI:  Body mass index is 26.27 kg/m.  Estimated Nutritional Needs:   Kcal:  5284-1324  Protein:  130-160g  Fluid:  >/= 2L   Arna Better, MS, RD, LDN Inpatient Clinical Dietitian Contact via Secure chat

## 2024-01-18 NOTE — Consult Note (Signed)
 WOC consulted for new ostomy; will FU Monday 4/21 for teaching and care.   Xiadani Damman [facility], CNS, [employer] [phone]

## 2024-01-18 NOTE — Progress Notes (Signed)
 PHARMACY NOTE:  ANTIMICROBIAL RENAL DOSAGE ADJUSTMENT  Current antimicrobial regimen includes a mismatch between antimicrobial dosage and estimated renal function.  As per policy approved by the Pharmacy & Therapeutics and Medical Executive Committees, the antimicrobial dosage will be adjusted accordingly.  Current antimicrobial dosage: Zosyn  2.25 g IV q6h  Indication: intra-abdominal infection  Renal Function:  Estimated Creatinine Clearance: 14.4 mL/min (A) (by C-G formula based on SCr of 5.38 mg/dL (H)). [x]      On CRRT    Antimicrobial dosage has been changed to: Zosyn  3.375 g IV q6h as 30 min infusion while on CRRT   Thank you for allowing pharmacy to be a part of this patient's care.  Lolita Rise, PharmD, BCPS Clinical Pharmacist 01/18/2024 12:43 PM

## 2024-01-18 NOTE — Anesthesia Postprocedure Evaluation (Signed)
 Anesthesia Post Note  Patient: Alexander [name]  Procedure(s) Performed: LAPAROTOMY, EXPLORATORY (Abdomen) CREATION,  END ILEOSTOMY (Abdomen) EXCISION, SMALL INTESTINE (Abdomen) APPLICATION, WOUND VAC (Abdomen)     Patient location during evaluation: ICU Anesthesia Type: General Level of consciousness: sedated and patient remains intubated per anesthesia plan Pain management: pain level controlled Vital Signs Assessment: post-procedure vital signs reviewed and stable Respiratory status: patient remains intubated per anesthesia plan Cardiovascular status: stable Postop Assessment: no apparent nausea or vomiting Anesthetic complications: no   No notable events documented.  Last Vitals:  Vitals:   01/18/24 0745 01/18/24 1000  BP:    Pulse:    Resp:    Temp:    SpO2: 100% 96%    Last Pain:  Vitals:   01/18/24 0715  TempSrc:   PainSc: Asleep                 Conard Decent

## 2024-01-18 NOTE — Progress Notes (Signed)
 eLink Physician-Brief Progress Note Patient Name: Alexander [name] DOB: 02-09-1961 MRN: [id number]   Date of Service  01/18/2024  HPI/Events of Note  Hemoglobin 6.9, curious dropped from 9.1.  Post-op today.  eICU Interventions  Transfuse 1 unit PRBC, continue to trend     Intervention Category Intermediate Interventions: Bleeding - evaluation and treatment with blood products  Takyah Ciaramitaro 01/18/2024, 10:58 PM

## 2024-01-18 NOTE — Op Note (Signed)
 01/11/2024 - 01/18/2024  9:37 AM  PATIENT:  Alexander [name]  63 y.o. male  PRE-OPERATIVE DIAGNOSIS: h/o  Perforated ascending Diverticulitis; septic shock, history of exploratory laparotomy, right colectomy without anastomosis-bowel in discontinuity, placement of ABThera wound VAC January 15, 2024 due to perforation; abdominal re-exploration, small bowel resection without anastomosis, placement of ABTheral wound vac 01/16/2024   POST-OPERATIVE DIAGNOSIS: same  PROCEDURE:  Procedure(s): LAPAROTOMY, EXPLORATORY CREATION, ILEOSTOMY Small bowel resection Placement of wound vac <50cm2  SURGEON:  Surgeon(s): [name], MD   ASSISTANTS: Dareen Ebbing, MD (an assistant was requested and needed for issue retraction, assistance in determining viability of rectosigmoid colon, and assistance in abdominal closure given the bowel distention)  ANESTHESIA:   general  DRAINS: Nasogastric Tube and Urinary Catheter (Foley)   LOCAL MEDICATIONS USED:  NONE  SPECIMEN:  Source of Specimen:  addl ileum  DISPOSITION OF SPECIMEN:  PATHOLOGY  Findings: The distal 2 ends of the previously stapled off small bowel had patchy necrosis so that section was resected to create a fresh viable end ileum and that was brought out as a end ileostomy.  The transected staple line of the mid transverse colon appeared viable.  A surgical drain was brought in in the left mid abdomen and brought up across the transverse colon staple line and into the right paracolic gutter.  The rectosigmoid while distended and had some ischemic changes appeared viable and had fluorescent activity when utilizing ICG dye  COUNTS:  YES  EBL: Less than 100 cc  Fluids: 1 unit PRBC  INDICATION FOR PROCEDURE: 63 year old gentleman who was taken to the operating room earlier this week for right colon perforation in the setting of of sepsis and GI bleed.  He was found to have significant perforation of his right colon of about a 2 inch defect with  significant intra-abdominal contamination of blood, stool, and pus.  A damage control laparotomy was performed and he underwent resection of his right colon and left in discontinuity.  He was taken back to the ICU.  Postoperatively he went to full-blown septic shock with DIC required massive transfusion protocol and was brought back to the operating room on April 17 for reexploration because of continued bleeding.  At that time no overt source was found.  His abdomen was packed with lap pads and an ABThera was placed and he was taken back to the ICU for further resuscitation.  Since then his coagulopathy has significantly improved and his transfusion requirement has significantly lessened.  Last transfusion was yesterday morning and his INR was below 2.  His vasopressor requirement was also improved however the patient was still progressing toward renal failure.  Discussed bringing the patient back to the OR with his son.  Discussed potential need for additional resection,, creation of end colostomy and additional procedures and abdominal closure.  Risk and benefits were discussed and separately documented  PROCEDURE: Patient was brought directly from the [place] long ICU to the OR 1 at [facility] and placed upon operating room table.  His endotracheal tube was connected to the anesthesia circuit.  Sequential compression devices were already in place and functioning.  Full general anesthesia was established.  External ABThera dressing was removed.  His abdomen was then prepped and draped in the usual standard surgical fashion with Betadine.  Surgical timeout was performed.  The patient was on scheduled therapeutic IV antibiotics.  The internal sponge was removed.  There was a fair amount of old clot on top of the intestine  and in each gutter but not as much clot as the other day.  There was no succus.  The rectosigmoid colon was decompressed a little bit better than the other day but on the external wall  of the rectosigmoid colon there is some patchy areas that potentially look like ischemia.  The transected end of the small bowel had patchy areas of necrosis.  The stapled end of the mid transverse colon was intact.  I removed the 4 laparotomy pads and the 2 combat gauze packs from the abdomen.  I asked my partner to join the operating room to help with retraction of tissue and to help me assess the rectosigmoid colon.  ICG dye was administered and we assess bowel perfusion using the Stryker camera system to look for fluorescent activity in the intestine.  There was fluorescent activity seen throughout the small bowel and in the rectosigmoid colon.  There is no evidence of decreased perfusion in that section rectosigmoid colon so we elected to not resect that area.  The stapled end of the distal small bowel had patchy areas of necrosis for about 2 inches so we took down the mesentery of that segment and then stapled off that distal end of the small bowel to nice healthy appearing small bowel.  We irrigated the abdomen with several liters of saline.  We then placed a surgical drain on the patient's left side of the abdominal wall and brought it into the abdomen where it went up the left abdomen across the transverse colon staple and terminated into the right paracolic gutter.  It was secured to the skin with a 2-0 nylon.  I then made a circular skin defect in the right upper quadrant.  The subcutaneous tissue was spread and divided with electrocautery.  The fascia was incised in a cruciate fashion with electrocautery.  The rectus muscle was spread and the peritoneum was incised.  It accommodated 2 fingers.  We then brought out the end of the ileum through the skin defect as an end ileostomy.  There was good perfusion to that section because there was some bleeding from the marginal blood vessel along the mesentery which was taking care of with a 2-0 silk suture.  We then closed the abdomen with #1 looped PDS 1 from  above and 1 from below and tied centrally.  We we had placed about 5 interrupted 0 Novafil sutures as internal retention sutures as we were closing the fascia as well.  A black sponge was then placed in the soft tissue and a wound VAC was secured to the skin with a good seal.  I then excised the staple line of the end ileostomy and there was great perfusion because that the edges of the intestine were bleeding.  I then matured the ileostomy in a Brooke fashion using multiple interrupted 3-0 Vicryl sutures.  An ostomy appliance was applied.  All needle, instrument, and sponge counts were correct x 2.  There were no immediate complications.  The patient was taken back to the ICU in critical condition  PLAN OF CARE:  already inpatient  PATIENT DISPOSITION:  ICU - intubated and critically ill.   Delay start of Pharmacological VTE agent (>24hrs) due to surgical blood loss or risk of bleeding:  yes  Marianna Shirk. Elvan Hamel, MD, FACS General, Bariatric, & Minimally Invasive Surgery [facility], [name]

## 2024-01-18 NOTE — Transfer of Care (Signed)
 Immediate Anesthesia Transfer of Care Note  Patient: Alexander [name]  Procedure(s) Performed: LAPAROTOMY, EXPLORATORY (Abdomen) CREATION,  END ILEOSTOMY (Abdomen) EXCISION, SMALL INTESTINE (Abdomen) APPLICATION, WOUND VAC (Abdomen)  Patient Location: ICU  Anesthesia Type:General  Level of Consciousness: Patient remains intubated per anesthesia plan  Airway & Oxygen Therapy: Patient remains intubated per anesthesia plan  Post-op Assessment: Report given to RN and Post -op Vital signs reviewed and stable  Post vital signs: Reviewed and stable  Last Vitals:  Vitals Value Taken Time  BP    Temp    Pulse    Resp    SpO2 96 % 01/18/24 1000    Last Pain:  Vitals:   01/18/24 0715  TempSrc:   PainSc: Asleep      Patients Stated Pain Goal: 4 (01/12/24 2355)  Complications: No notable events documented.vital signs stable on transport froim OR to ICU ASA monirotrs art line zeroed to COW appropriate waveform report to RN all questions answered

## 2024-01-18 NOTE — Procedures (Signed)
 Central Venous Catheter Insertion Procedure Note  Alexander [name]  [id number]  1961/09/15  Date:01/18/24  Time:12:47 PM   Provider Performing:Almin Livingstone A Mehkai Gallo   Procedure: Insertion of Non-tunneled Central Venous Catheter(36556)with US  guidance (78295)    Indication(s) Hemodialysis  Consent Consent obtained from patient's son  Anesthesia Topical only with 1% lidocaine    Timeout Verified patient identification, verified procedure, site/side was marked, verified correct patient position, special equipment/implants available, medications/allergies/relevant history reviewed, required imaging and test results available.  Sterile Technique Maximal sterile technique including full sterile barrier drape, hand hygiene, sterile gown, sterile gloves, mask, hair covering, sterile ultrasound probe cover (if used).  Procedure Description Area of catheter insertion was cleaned with chlorhexidine  and draped in sterile fashion.   With real-time ultrasound guidance a HD catheter was placed into the right femoral vein.  Nonpulsatile blood flow and easy flushing noted in all ports.  The catheter was sutured in place and sterile dressing applied.  Complications/Tolerance None; patient tolerated the procedure well. Chest X-ray is ordered to verify placement for internal jugular or subclavian cannulation.  Chest x-ray is not ordered for femoral cannulation.  EBL Minimal  Specimen(s) None  Did attempt left internal jugular cannulation Vessel very collapsible and could not thread guidewire -Attempt was aborted

## 2024-01-18 NOTE — Procedures (Addendum)
 I have reviewed the pt's issues and adjusted the CRRT session to match the needs of the patient.  Larry Poag MD  [facility] 01/18/2024, 9:45 PM

## 2024-01-18 NOTE — OR Nursing (Signed)
 Patient brought to OR on 01/18/24 by Dr Elvan Hamel for abdominal exploration, creation of ileostomy and abdominal closure.  4 lap pads and 2 combat retrieved prior to start of procedure.  Closing and final counts correct.

## 2024-01-18 NOTE — Progress Notes (Signed)
 eLink Physician-Brief Progress Note Patient Name: Alexander [name] DOB: 1960-11-17 MRN: [id number]   Date of Service  01/18/2024  HPI/Events of Note  potassium 5.4, calcium  5.7, cret 5.38, GFR 11, phos 9.2  eICU Interventions  No intervention about potassium or phosphorus, magnesium within normal limits.  Add calcium  supplementation.     Intervention Category Minor Interventions: Electrolytes abnormality - evaluation and management  Devlon Dosher 01/18/2024, 6:05 AM

## 2024-01-18 NOTE — Plan of Care (Signed)

## 2024-01-18 NOTE — Plan of Care (Signed)
  Problem: Clinical Measurements: Goal: Diagnostic test results will improve Outcome: Progressing Goal: Respiratory complications will improve Outcome: Progressing Goal: Cardiovascular complication will be avoided Outcome: Progressing   Problem: Elimination: Goal: Will not experience complications related to bowel motility Outcome: Progressing Goal: Will not experience complications related to urinary retention Outcome: Progressing   Problem: Pain Managment: Goal: General experience of comfort will improve and/or be controlled Outcome: Progressing   Problem: Safety: Goal: Ability to remain free from injury will improve Outcome: Progressing   Problem: Skin Integrity: Goal: Risk for impaired skin integrity will decrease Outcome: Progressing   Problem: Respiratory: Goal: Ability to maintain a clear airway and adequate ventilation will improve Outcome: Progressing   Problem: Education: Goal: Knowledge of General Education information will improve Description: Including pain rating scale, medication(s)/side effects and non-pharmacologic comfort measures Outcome: Not Progressing   Problem: Health Behavior/Discharge Planning: Goal: Ability to manage health-related needs will improve Outcome: Not Progressing   Problem: Clinical Measurements: Goal: Will remain free from infection Outcome: Not Progressing   Problem: Nutrition: Goal: Adequate nutrition will be maintained Outcome: Not Progressing   Problem: Coping: Goal: Level of anxiety will decrease Outcome: Not Progressing   Problem: Role Relationship: Goal: Method of communication will improve Outcome: Not Progressing

## 2024-01-18 NOTE — Interval H&P Note (Signed)
 History and Physical Interval Note:  01/18/2024 7:42 AM  Alexander [name]  has presented today for surgery, with the diagnosis of Perforated Diverticulitis.  The various methods of treatment have been discussed with the patient and family. After consideration of risks, benefits and other options for treatment, the patient has consented to  Procedure(s): LAPAROTOMY, EXPLORATORY (N/A) CREATION, ILEOSTOMY (N/A) as a surgical intervention.  The patient's history has been reviewed, patient examined, no change in status, stable for surgery.  I have reviewed the patient's chart and labs.  Questions were answered to the patient's satisfaction.    Updated son. Discussed risks including but not limited to bleeding, infection, hernia/dehiscence, perforation/leak, blood clot, cardiac/pulm events, death.  [name]

## 2024-01-19 DIAGNOSIS — I469 Cardiac arrest, cause unspecified: Secondary | ICD-10-CM | POA: Diagnosis not present

## 2024-01-19 DIAGNOSIS — N179 Acute kidney failure, unspecified: Secondary | ICD-10-CM | POA: Diagnosis not present

## 2024-01-19 DIAGNOSIS — J9601 Acute respiratory failure with hypoxia: Secondary | ICD-10-CM | POA: Diagnosis not present

## 2024-01-19 DIAGNOSIS — A419 Sepsis, unspecified organism: Secondary | ICD-10-CM | POA: Diagnosis not present

## 2024-01-19 LAB — DIC (DISSEMINATED INTRAVASCULAR COAGULATION)PANEL
D-Dimer, Quant: 7.22 ug{FEU}/mL — ABNORMAL HIGH (ref 0.00–0.50)
Fibrinogen: 389 mg/dL (ref 210–475)
INR: 1.1 (ref 0.8–1.2)
Platelets: 114 10*3/uL — ABNORMAL LOW (ref 150–400)
Prothrombin Time: 14.8 s (ref 11.4–15.2)
Smear Review: NONE SEEN
aPTT: 34 s (ref 24–36)

## 2024-01-19 LAB — BLOOD GAS, ARTERIAL
Acid-Base Excess: 6.6 mmol/L — ABNORMAL HIGH (ref 0.0–2.0)
Bicarbonate: 28.1 mmol/L — ABNORMAL HIGH (ref 20.0–28.0)
Drawn by: 20012
FIO2: 40 %
MECHVT: 560 mL
O2 Content: 40 L/min
O2 Saturation: 94.9 %
PEEP: 5 cmH2O
Patient temperature: 36.9
RATE: 20 {breaths}/min
pCO2 arterial: 30 mmHg — ABNORMAL LOW (ref 32–48)
pH, Arterial: 7.58 — ABNORMAL HIGH (ref 7.35–7.45)
pO2, Arterial: 64 mmHg — ABNORMAL LOW (ref 83–108)

## 2024-01-19 LAB — RENAL FUNCTION PANEL
Albumin: 2 g/dL — ABNORMAL LOW (ref 3.5–5.0)
Albumin: 1.9 g/dL — ABNORMAL LOW (ref 3.5–5.0)
Anion gap: 13 (ref 5–15)
Anion gap: 11 (ref 5–15)
BUN: 70 mg/dL — ABNORMAL HIGH (ref 8–23)
BUN: 56 mg/dL — ABNORMAL HIGH (ref 8–23)
CO2: 28 mmol/L (ref 22–32)
CO2: 28 mmol/L (ref 22–32)
Calcium: 6.3 mg/dL — CL (ref 8.9–10.3)
Calcium: 6.6 mg/dL — ABNORMAL LOW (ref 8.9–10.3)
Chloride: 95 mmol/L — ABNORMAL LOW (ref 98–111)
Chloride: 99 mmol/L (ref 98–111)
Creatinine, Ser: 4.38 mg/dL — ABNORMAL HIGH (ref 0.61–1.24)
Creatinine, Ser: 3.36 mg/dL — ABNORMAL HIGH (ref 0.61–1.24)
GFR, Estimated: 15 mL/min — ABNORMAL LOW (ref >60)
GFR, Estimated: 20 mL/min — ABNORMAL LOW (ref >60)
Glucose, Bld: 108 mg/dL — ABNORMAL HIGH (ref 70–99)
Glucose, Bld: 109 mg/dL — ABNORMAL HIGH (ref 70–99)
Phosphorus: 6.4 mg/dL — ABNORMAL HIGH (ref 2.5–4.6)
Phosphorus: 5.5 mg/dL — ABNORMAL HIGH (ref 2.5–4.6)
Potassium: 5.1 mmol/L (ref 3.5–5.1)
Potassium: 4.8 mmol/L (ref 3.5–5.1)
Sodium: 136 mmol/L (ref 135–145)
Sodium: 138 mmol/L (ref 135–145)

## 2024-01-19 LAB — HEPATIC FUNCTION PANEL
ALT: 1133 U/L — ABNORMAL HIGH (ref 0–44)
AST: 1044 U/L — ABNORMAL HIGH (ref 15–41)
Albumin: 1.9 g/dL — ABNORMAL LOW (ref 3.5–5.0)
Alkaline Phosphatase: 99 U/L (ref 38–126)
Bilirubin, Direct: 1.9 mg/dL — ABNORMAL HIGH (ref 0.0–0.2)
Indirect Bilirubin: 0.9 mg/dL (ref 0.3–0.9)
Total Bilirubin: 2.8 mg/dL — ABNORMAL HIGH (ref 0.0–1.2)
Total Protein: 4.4 g/dL — ABNORMAL LOW (ref 6.5–8.1)

## 2024-01-19 LAB — HEMOGLOBIN AND HEMATOCRIT, BLOOD
HCT: 29.2 % — ABNORMAL LOW (ref 39.0–52.0)
HCT: 28.8 % — ABNORMAL LOW (ref 39.0–52.0)
Hemoglobin: 9.9 g/dL — ABNORMAL LOW (ref 13.0–17.0)
Hemoglobin: 9.6 g/dL — ABNORMAL LOW (ref 13.0–17.0)

## 2024-01-19 LAB — GLUCOSE, CAPILLARY
Glucose-Capillary: 101 mg/dL — ABNORMAL HIGH (ref 70–99)
Glucose-Capillary: 102 mg/dL — ABNORMAL HIGH (ref 70–99)
Glucose-Capillary: 107 mg/dL — ABNORMAL HIGH (ref 70–99)
Glucose-Capillary: 107 mg/dL — ABNORMAL HIGH (ref 70–99)
Glucose-Capillary: 93 mg/dL (ref 70–99)
Glucose-Capillary: 97 mg/dL (ref 70–99)
Glucose-Capillary: 98 mg/dL (ref 70–99)

## 2024-01-19 LAB — TRIGLYCERIDES: Triglycerides: 385 mg/dL — ABNORMAL HIGH (ref <150)

## 2024-01-19 LAB — APTT: aPTT: 34 s (ref 24–36)

## 2024-01-19 LAB — PREPARE RBC (CROSSMATCH)

## 2024-01-19 LAB — MAGNESIUM: Magnesium: 2.2 mg/dL (ref 1.7–2.4)

## 2024-01-19 NOTE — Progress Notes (Signed)
 Patient ID: Alexander [name], male   DOB: [birth year], 63 y.o.   MRN: [id number]   Follow up - Emergency General Surgery  Patient Details:    Phinehas Grounds is an 63 y.o. male.  Lines/tubes : Airway 7.5 mm (Active)  Secured at (cm) 23 cm 01/19/24 0329  Measured From Lips 01/19/24 0329  Secured Location Right 01/19/24 0329  Secured By [employer] 01/19/24 0329  Bite Block No 01/19/24 0329  Tube Holder Repositioned Yes 01/19/24 0329  Prone position No 01/19/24 0329  Head position Right 01/18/24 2000  Cuff Pressure (cm H2O) Clear OR 27-39 CmH2O 01/19/24 0329  Site Condition Dry 01/19/24 0329     CVC Double Lumen 01/15/24 Right Internal jugular 16 cm (Active)  Indication for Insertion or Continuance of Line Vasoactive infusions 01/19/24 0400  Site Assessment Clean, Dry, Intact 01/19/24 0400  Proximal Lumen Status Infusing 01/19/24 0400  Distal Lumen Status Infusing 01/19/24 0400  Dressing Type Transparent 01/19/24 0400  Dressing Status Antimicrobial disc/dressing in place;Clean;Dry 01/19/24 0400  Line Care Connections checked and tightened 01/19/24 0400  Dressing Intervention Dressing changed 01/19/24 0545  Dressing Change Due 01/26/24 01/19/24 0545     Arterial Line 01/15/24 Left Radial (Active)  Site Assessment Clean, Dry, Intact 01/19/24 0400  Line Status Pulsatile blood flow 01/19/24 0400  Art Line Waveform Appropriate 01/19/24 0400  Art Line Interventions Connections checked and tightened;Flushed per protocol;Line pulled back 01/19/24 0400  Color/Movement/Sensation Capillary refill less than 3 sec 01/19/24 0400  Dressing Type Transparent 01/19/24 0400  Dressing Status Clean, Dry, Intact;Antimicrobial disc in place 01/19/24 0400  Interventions Other (Comment) 01/19/24 0400  Dressing Change Due 01/22/24 01/19/24 0400     Closed System Drain LLQ Bulb (JP) 19 Fr. (Active)  Site Description Unable to view 01/18/24 2000  Dressing Status Clean, Dry, Intact 01/18/24 2000   Drainage Appearance Bright red;Serosanguineous 01/18/24 2000  Status To gravity (Uncharged) 01/18/24 2000  Output (mL) 20 mL 01/19/24 0800     Negative Pressure Wound Therapy Abdomen Medial;Upper (Active)  Site / Wound Assessment Dressing in place / Unable to assess 01/18/24 2000  Wound filler - Black foam 1 01/18/24 0900  Target Pressure (mmHg) 125 01/18/24 2000  Dressing Status Intact 01/18/24 2000  Drainage Amount Minimal 01/18/24 2000  Drainage Description Sanguineous 01/18/24 2000  Output (mL) 0 mL 01/19/24 0800     NG/OG Vented/Dual Lumen 18 Fr. Oral (Active)  Tube Position (Required) Marking at nare/corner of mouth 01/19/24 0745  Measurement (cm) (Required) 60 cm 01/18/24 2000  Ongoing Placement Verification (Required) (See row information) Yes 01/19/24 0745  Site Assessment Clean, Dry, Intact 01/19/24 0745  Interventions Other (Comment) 01/17/24 0515  Status Low intermittent suction 01/19/24 0745  Amount of suction 55 mmHg 01/18/24 2000  Drainage Appearance Bile;Brown 01/19/24 0745  Intake (mL) 60 mL 01/18/24 0020  Output (mL) 28 mL 01/19/24 0800     Ileostomy Standard (end) RUQ (Active)  Ostomy Pouch 1 piece 01/19/24 0745  Stoma Assessment Red 01/19/24 0745  Peristomal Assessment Intact 01/19/24 0745  Output (mL) 0 mL 01/19/24 0800     Urethral Catheter JAMIE BANKS RN Latex 14 Fr. (Active)  Indication for Insertion or Continuance of Catheter Therapy based on hourly urine output monitoring and documentation for critical condition (NOT STRICT I&O) 01/19/24 0745  Site Assessment Clean, Dry, Intact;Swelling 01/19/24 0745  Catheter Maintenance Bag below level of bladder;Catheter secured;Drainage bag/tubing not touching floor;Insertion date on drainage bag;No dependent loops;Seal intact 01/19/24 0745  Collection Container Standard  drainage bag 01/19/24 0745  Securement Method Adhesive securement device 01/19/24 0745  Urinary Catheter Interventions (if applicable) Unclamped  01/18/24 2000  Input (mL) 10 mL 01/16/24 2047  Output (mL) 11 mL 01/19/24 0800     Fecal Management System 30 mL (Active)  Does patient meet criteria for removal? No 01/19/24 0745  Daily care Skin around tube assessed;Assess location of position indicator line;Bag changed (every 24 hours) 01/19/24 0745  Patient Indicator Assessment Green 01/19/24 0745  Bulb Deflated and Reinflated Yes 01/19/24 0745  Amount in bulb 35 mL 01/19/24 0745  Output (mL) 0 mL 01/19/24 0800    Microbiology/Sepsis markers: Results for orders placed or performed during the hospital encounter of 01/11/24  Culture, blood (single)     Status: None (Preliminary result)   Collection Time: 01/11/24  7:57 PM   Specimen: BLOOD  Result Value Ref Range Status   Specimen Description   Final    BLOOD BLOOD RIGHT ARM Performed at [facility], [address] [place], [place] [address]    Special Requests   Final    BOTTLES DRAWN AEROBIC AND ANAEROBIC Blood Culture adequate volume Performed at [facility], [address] [place], [place] [address]    Culture  Setup Time   Final    GRAM POSITIVE RODS ANAEROBIC BOTTLE ONLY CRITICAL RESULT CALLED TO, READ BACK BY AND VERIFIED WITH: PHARMD E JACKSON 01/16/2024 @ 0350 BY AB    Culture   Final    GRAM POSITIVE RODS IDENTIFICATION TO FOLLOW Performed at [facility], [address], [place], [place] [address]    Report Status PENDING  Incomplete  MRSA Next Gen by PCR, Nasal     Status: Abnormal   Collection Time: 01/12/24 12:06 AM   Specimen: Nasal Mucosa; Nasal Swab  Result Value Ref Range Status   MRSA by PCR Next Gen DETECTED (A) NOT DETECTED Corrected    Comment: CRITICAL RESULT CALLED TO, READ BACK BY AND VERIFIED WITH: Godwin Lat, RN [phone] 01/12/24 MH (NOTE) The GeneXpert MRSA Assay (FDA approved for NASAL specimens only), is one component of a comprehensive MRSA colonization surveillance program. It is not intended to  diagnose MRSA infection nor to guide or monitor treatment for MRSA infections. Test performance is not FDA approved in patients less than 4 years old. Performed at [facility], [address], [place], [place] [address] CORRECTED ON 04/13 AT 0255: PREVIOUSLY REPORTED AS DETECTED CRITICAL RESULT CALLED TO, READ BACK BY AND VERIFIED WITH: Godwin Lat, RN [phone] 01/12/24 MH   Resp panel by RT-PCR (RSV, Flu A&B, Covid) Anterior Nasal Swab     Status: None   Collection Time: 01/12/24  9:53 AM   Specimen: Anterior Nasal Swab  Result Value Ref Range Status   SARS Coronavirus 2 by RT PCR NEGATIVE NEGATIVE Final    Comment: (NOTE) SARS-CoV-2 target nucleic acids are NOT DETECTED.  The SARS-CoV-2 RNA is generally detectable in upper respiratory specimens during the acute phase of infection. The lowest concentration of SARS-CoV-2 viral copies this assay can detect is 138 copies/mL. A negative result does not preclude SARS-Cov-2 infection and should not be used as the sole basis for treatment or other patient management decisions. A negative result may occur with  improper specimen collection/handling, submission of specimen other than nasopharyngeal swab, presence of viral mutation(s) within the areas targeted by this assay, and inadequate number of viral copies(<138 copies/mL). A negative result must be combined with clinical  observations, patient history, and epidemiological information. The expected result is Negative.  Fact Sheet for Patients:  BloggerCourse.com  Fact Sheet for Healthcare Providers:  SeriousBroker.it  This test is no t yet approved or cleared by the [place]  FDA and  has been authorized for detection and/or diagnosis of SARS-CoV-2 by FDA under an Emergency Use Authorization (EUA). This EUA will remain  in effect (meaning this test can be used) for the duration of the COVID-19 declaration under Section  564(b)(1) of the Act, 21 U.S.C.section 360bbb-3(b)(1), unless the authorization is terminated  or revoked sooner.       Influenza A by PCR NEGATIVE NEGATIVE Final   Influenza B by PCR NEGATIVE NEGATIVE Final    Comment: (NOTE) The Xpert Xpress SARS-CoV-2/FLU/RSV plus assay is intended as an aid in the diagnosis of influenza from Nasopharyngeal swab specimens and should not be used as a sole basis for treatment. Nasal washings and aspirates are unacceptable for Xpert Xpress SARS-CoV-2/FLU/RSV testing.  Fact Sheet for Patients: BloggerCourse.com  Fact Sheet for Healthcare Providers: SeriousBroker.it  This test is not yet approved or cleared by the [place]  FDA and has been authorized for detection and/or diagnosis of SARS-CoV-2 by FDA under an Emergency Use Authorization (EUA). This EUA will remain in effect (meaning this test can be used) for the duration of the COVID-19 declaration under Section 564(b)(1) of the Act, 21 U.S.C. section 360bbb-3(b)(1), unless the authorization is terminated or revoked.     Resp Syncytial Virus by PCR NEGATIVE NEGATIVE Final    Comment: (NOTE) Fact Sheet for Patients: BloggerCourse.com  Fact Sheet for Healthcare Providers: SeriousBroker.it  This test is not yet approved or cleared by the [place]  FDA and has been authorized for detection and/or diagnosis of SARS-CoV-2 by FDA under an Emergency Use Authorization (EUA). This EUA will remain in effect (meaning this test can be used) for the duration of the COVID-19 declaration under Section 564(b)(1) of the Act, 21 U.S.C. section 360bbb-3(b)(1), unless the authorization is terminated or revoked.  Performed at [facility], [address], [place], [place] [address]   Culture, blood (Routine X 2) w Reflex to ID Panel     Status: None   Collection Time: 01/13/24   7:16 AM   Specimen: BLOOD LEFT HAND  Result Value Ref Range Status   Specimen Description   Final    BLOOD LEFT HAND Performed at [facility], [address], [place], [place] [address]    Special Requests   Final    BOTTLES DRAWN AEROBIC AND ANAEROBIC Blood Culture results may not be optimal due to an inadequate volume of blood received in culture bottles Performed at [facility], [address] [place], [place] [address]    Culture   Final    NO GROWTH 5 DAYS Performed at [facility], [address] [place], [place] [address]    Report Status 01/18/2024 FINAL  Final  Culture, blood (Routine X 2) w Reflex to ID Panel     Status: None   Collection Time: 01/13/24  7:16 AM   Specimen: BLOOD RIGHT ARM  Result Value Ref Range Status   Specimen Description   Final    BLOOD RIGHT ARM Performed at [facility], [address], [place], [place] [address]    Special Requests   Final    BOTTLES DRAWN AEROBIC AND ANAEROBIC Blood Culture results may not be optimal due to an inadequate volume of  blood received in culture bottles Performed at [facility], [address] [place], [place] [address]    Culture   Final    NO GROWTH 5 DAYS Performed at [facility], [address], [place], [place] [address]    Report Status 01/18/2024 FINAL  Final    Anti-infectives:  Anti-infectives (From admission, onward)    Start     Dose/Rate Route Frequency Ordered Stop   01/18/24 1400  piperacillin -tazobactam (ZOSYN ) IVPB 3.375 g  Status:  Discontinued        3.375 g 12.5 mL/hr over 240 Minutes Intravenous Every 6 hours 01/18/24 1241 01/18/24 1242   01/18/24 1400  piperacillin -tazobactam (ZOSYN ) IVPB 3.375 g        3.375 g 100 mL/hr over 30 Minutes Intravenous Every 6 hours 01/18/24 1242     01/18/24 0000  piperacillin -tazobactam (ZOSYN ) IVPB 2.25 g  Status:  Discontinued        2.25 g 100 mL/hr over 30 Minutes Intravenous Every  6 hours 01/17/24 2120 01/18/24 1241   01/12/24 0200  piperacillin -tazobactam (ZOSYN ) IVPB 3.375 g  Status:  Discontinued        3.375 g 12.5 mL/hr over 240 Minutes Intravenous Every 8 hours 01/11/24 2120 01/17/24 2120   01/11/24 1815  piperacillin -tazobactam (ZOSYN ) IVPB 3.375 g        3.375 g 100 mL/hr over 30 Minutes Intravenous  Once 01/11/24 1806 01/11/24 1921       Best Practice/Protocols:  VTE Prophylaxis: Mechanical GI Prophylaxis: Proton Pump Inhibitor Continous Sedation  Consults: Treatment Team:  Lenise Quince, Md, MD Chucky Craver, MD    Studies:    Events:  Subjective:    Overnight Issues:  Got 1u prbc for hgb 6.9 overnight; pressor requirement down started on CRRT yesterday Objective:  Vital signs for last 24 hours: Temp:  [97.9 F (36.6 C)-98.8 F (37.1 C)] 98.4 F (36.9 C) (04/20 0700) Pulse Rate:  [57-67] 60 (04/20 0700) Resp:  [15-21] 20 (04/20 0700) BP: (135)/(53) 135/53 (04/20 0206) SpO2:  [93 %-98 %] 94 % (04/20 0700) Arterial Line BP: (114-167)/(49-69) 132/54 (04/20 0700) FiO2 (%):  [40 %-45 %] 40 % (04/20 0400) Weight:  [92.6 kg-95.1 kg] 92.6 kg (04/20 0500)  Hemodynamic parameters for last 24 hours:    Intake/Output from previous day: 04/19 0701 - 04/20 0700 In: 3389.8 [I.V.:2297.1; Blood:653; IV Piggyback:439.8] Out: 3642 [Urine:417; Emesis/NG output:1025; Drains:780; Stool:560; Blood:50]  Intake/Output this shift: Total I/O In: 70.5 [I.V.:37.1; IV Piggyback:33.4] Out: 100 [Urine:11; Emesis/NG output:28; Drains:20; Stool:35]  Vent settings for last 24 hours: Vent Mode: PRVC FiO2 (%):  [40 %-45 %] 40 % Set Rate:  [20 bmp] 20 bmp Vt Set:  [560 mL] 560 mL PEEP:  [5 cmH20] 5 cmH20 Plateau Pressure:  [19 cmH20-25 cmH20] 19 cmH20  Physical Exam:  Intubated Soft, some distension JP - a little bright red blood Soft tissue wound vac secure Ileostomy -edematous/viable; some liquid stool in bag B/l LE edema Arouses to voice  Results for  orders placed or performed during the hospital encounter of 01/11/24 (from the past 24 hours)  Hemoglobin and hematocrit, blood     Status: Abnormal   Collection Time: 01/18/24 10:54 AM  Result Value Ref Range   Hemoglobin 8.9 (L) 13.0 - 17.0 g/dL   HCT 08.6 (L) 57.8 - 46.9 %  Glucose, capillary     Status: None   Collection Time: 01/18/24 12:36 PM  Result Value Ref Range   Glucose-Capillary 99 70 -  99 mg/dL   Comment 1 Notify RN    Comment 2 Document in Chart   Hemoglobin and hematocrit, blood     Status: Abnormal   Collection Time: 01/18/24  3:48 PM  Result Value Ref Range   Hemoglobin 9.1 (L) 13.0 - 17.0 g/dL   HCT 40.9 (L) 81.1 - 91.4 %  Renal function panel (daily at 1600)     Status: Abnormal   Collection Time: 01/18/24  3:48 PM  Result Value Ref Range   Sodium 136 135 - 145 mmol/L   Potassium 5.4 (H) 3.5 - 5.1 mmol/L   Chloride 89 (L) 98 - 111 mmol/L   CO2 27 22 - 32 mmol/L   Glucose, Bld 104 (H) 70 - 99 mg/dL   BUN 79 (H) 8 - 23 mg/dL   Creatinine, Ser 7.82 (H) 0.61 - 1.24 mg/dL   Calcium  5.7 (LL) 8.9 - 10.3 mg/dL   Phosphorus 9.5 (H) 2.5 - 4.6 mg/dL   Albumin  2.0 (L) 3.5 - 5.0 g/dL   GFR, Estimated 10 (L) >60 mL/min   Anion gap 20 (H) 5 - 15  Glucose, capillary     Status: Abnormal   Collection Time: 01/18/24  3:49 PM  Result Value Ref Range   Glucose-Capillary 105 (H) 70 - 99 mg/dL   Comment 1 Notify RN    Comment 2 Document in Chart   Glucose, capillary     Status: None   Collection Time: 01/18/24  7:55 PM  Result Value Ref Range   Glucose-Capillary 99 70 - 99 mg/dL  Urinalysis, Routine w reflex microscopic -Urine, Unspecified Source     Status: Abnormal   Collection Time: 01/18/24  8:15 PM  Result Value Ref Range   Color, Urine YELLOW YELLOW   APPearance CLEAR CLEAR   Specific Gravity, Urine 1.008 1.005 - 1.030   pH 7.0 5.0 - 8.0   Glucose, UA NEGATIVE NEGATIVE mg/dL   Hgb urine dipstick MODERATE (A) NEGATIVE   Bilirubin Urine NEGATIVE NEGATIVE    Ketones, ur NEGATIVE NEGATIVE mg/dL   Protein, ur NEGATIVE NEGATIVE mg/dL   Nitrite NEGATIVE NEGATIVE   Leukocytes,Ua NEGATIVE NEGATIVE   RBC / HPF 21-50 0 - 5 RBC/hpf   WBC, UA 0-5 0 - 5 WBC/hpf   Bacteria, UA RARE (A) NONE SEEN   Squamous Epithelial / HPF 0-5 0 - 5 /HPF  Sodium, urine, random     Status: None   Collection Time: 01/18/24  8:15 PM  Result Value Ref Range   Sodium, Ur 99 mmol/L  Creatinine, urine, random     Status: None   Collection Time: 01/18/24  8:15 PM  Result Value Ref Range   Creatinine, Urine 16 mg/dL  Hemoglobin and hematocrit, blood     Status: Abnormal   Collection Time: 01/18/24 10:22 PM  Result Value Ref Range   Hemoglobin 6.9 (LL) 13.0 - 17.0 g/dL   HCT 95.6 (L) 21.3 - 08.6 %  Prepare RBC (crossmatch)     Status: None   Collection Time: 01/18/24 11:18 PM  Result Value Ref Range   Order Confirmation      ORDER PROCESSED BY BLOOD BANK Performed at [facility], [address] [place], [place] [address]   Type and screen [facility]     Status: None (Preliminary result)   Collection Time: 01/18/24 11:19 PM  Result Value Ref Range   ABO/RH(D) B POS    Antibody Screen NEG    Sample Expiration  01/21/2024,2359    Unit Number [id number]    Blood Component Type RED CELLS,LR    Unit division 00    Status of Unit ISSUED    Transfusion Status OK TO TRANSFUSE    Crossmatch Result      Compatible Performed at [facility], [address], [place], [place] [address]   Glucose, capillary     Status: Abnormal   Collection Time: 01/19/24 12:04 AM  Result Value Ref Range   Glucose-Capillary 107 (H) 70 - 99 mg/dL  Magnesium      Status: None   Collection Time: 01/19/24  1:08 AM  Result Value Ref Range   Magnesium  2.2 1.7 - 2.4 mg/dL  Renal function panel (daily at 0500)     Status: Abnormal   Collection Time: 01/19/24  1:08 AM  Result Value Ref Range   Sodium 136 135 - 145 mmol/L   Potassium  5.1 3.5 - 5.1 mmol/L   Chloride 95 (L) 98 - 111 mmol/L   CO2 28 22 - 32 mmol/L   Glucose, Bld 108 (H) 70 - 99 mg/dL   BUN 70 (H) 8 - 23 mg/dL   Creatinine, Ser 6.29 (H) 0.61 - 1.24 mg/dL   Calcium  6.3 (LL) 8.9 - 10.3 mg/dL   Phosphorus 6.4 (H) 2.5 - 4.6 mg/dL   Albumin  2.0 (L) 3.5 - 5.0 g/dL   GFR, Estimated 15 (L) >60 mL/min   Anion gap 13 5 - 15  Hepatic function panel     Status: Abnormal   Collection Time: 01/19/24  1:08 AM  Result Value Ref Range   Total Protein 4.4 (L) 6.5 - 8.1 g/dL   Albumin  1.9 (L) 3.5 - 5.0 g/dL   AST 5,284 (H) 15 - 41 U/L   ALT 1,133 (H) 0 - 44 U/L   Alkaline Phosphatase 99 38 - 126 U/L   Total Bilirubin 2.8 (H) 0.0 - 1.2 mg/dL   Bilirubin, Direct 1.9 (H) 0.0 - 0.2 mg/dL   Indirect Bilirubin 0.9 0.3 - 0.9 mg/dL  Glucose, capillary     Status: Abnormal   Collection Time: 01/19/24  4:46 AM  Result Value Ref Range   Glucose-Capillary 107 (H) 70 - 99 mg/dL  Triglycerides     Status: Abnormal   Collection Time: 01/19/24  5:41 AM  Result Value Ref Range   Triglycerides 385 (H) <150 mg/dL  DIC Panel Daily     Status: Abnormal   Collection Time: 01/19/24  5:41 AM  Result Value Ref Range   Prothrombin  Time 14.8 11.4 - 15.2 seconds   INR 1.1 0.8 - 1.2   aPTT 34 24 - 36 seconds   Fibrinogen  389 210 - 475 mg/dL   D-Dimer, Quant 1.32 (H) 0.00 - 0.50 ug/mL-FEU   Platelets 114 (L) 150 - 400 K/uL   Smear Review NO SCHISTOCYTES SEEN   APTT     Status: None   Collection Time: 01/19/24  5:41 AM  Result Value Ref Range   aPTT 34 24 - 36 seconds  Hemoglobin and hematocrit, blood     Status: Abnormal   Collection Time: 01/19/24  6:53 AM  Result Value Ref Range   Hemoglobin 9.9 (L) 13.0 - 17.0 g/dL   HCT 44.0 (L) 10.2 - 72.5 %  Glucose, capillary     Status: Abnormal   Collection Time: 01/19/24  7:29 AM  Result Value Ref Range   Glucose-Capillary 101 (H) 70 - 99 mg/dL      Latest Ref  Rng & Units 01/19/2024    6:53 AM 01/19/2024    5:41 AM 01/18/2024   10:22  PM  CBC  Hemoglobin 13.0 - 17.0 g/dL 9.9   6.9   Hematocrit 39.0 - 52.0 % 29.2   21.0   Platelets 150 - 400 K/uL  114       Assessment & Plan: Present on Admission:  (Resolved) Severe sepsis (HCC)  Essential hypertension, benign  History of pulmonary embolism  Diverticulitis  AKI (acute kidney injury) (HCC)  Hyperlipidemia  Spontaneous perforation of colon (HCC)  Ascites  History of pulmonary embolus (PE)  Pulmonary hypertension (HCC)  Cor pulmonale (HCC)  Peripheral arterial disease (HCC)  LAPAROTOMY, EXPLORATORY RIGHT COLECTOMY (without anastomosis) PLACEMENT OF ABTHERA WOUND VAC DRAINAGE OF INTRA-ABDOMINAL ABSCESS  4/16 EW   RE-EXPLORATION ABDOMEN, PLACEMENT OF ABTHERA WOUND VAC, SMALL BOWEL RESECTION 01/16/2024 EW  Re-exploration abdomen, small bowel resection, creation of end ileostomy, abd closure, placement wound vac 4/19 EW   FEN - pt with high OG output, not ready for enteral feeds; start TPN   Septic shock - down to , levo, vaso - per CCM ABL anemia - hgb drifted down to 6.9-->1u prbc; rec serial cbc; transfuse to keep hgb>7 (13 u prbc total, 1u cryo, 6u FFP, 1 plt) Coagulopathy - resolved AKI - low uop, Cr up, 23L +, started CRRT 4/19, pulled off 5lbs; defer to CCM/renal ID - cont broad spectrum IV abx; have source control now; prob just needs total of 5 days at this point Acute hypoxemic resp failure - per CCM  H/o VTE/PE/DVT - scds for now; was on coumadin   Dispo: await return of bowel function, transfuse as needed; start TPN   LOS: 8 days   Additional comments:I reviewed the patient's new clinical lab test results.  and I discussed the patient's exam/vitals/labs with Dr. Gaynell Keeler.  Critical Care Total Time*: 30 Minutes  Marianna Shirk. Elvan Hamel, MD, FACS General, Bariatric, & Minimally Invasive Surgery [facility]   01/19/2024  *Care during the described time interval was provided by me. I have reviewed this patient's  available data, including medical history, events of note, physical examination and test results as part of my evaluation.

## 2024-01-19 NOTE — Progress Notes (Signed)
   01/19/24 0831  Vent Select  Invasive or Noninvasive Invasive  Adult Vent Y  Airway 7.5 mm  Placement Date/Time: 01/15/24 (c) 1100   Grade View: Grade 1  Airway Device: Endotracheal Tube  Laryngoscope Blade: Miller;2  ETT Types: Oral  Size (mm): 7.5 mm  Cuffed: Min.occ.pres.  Insertion attempts: 1  Airway Equipment: Stylet  Placement Confirm...  Secured at (cm) 23 cm  Measured From Lips  Secured Location Center  Adult Ventilator Settings  Vent Type Servo i  Vent Mode (S)  PSV;CPAP  FiO2 (%) (S)  40 %  Pressure Support (S)  10 cmH20  PEEP (S)  5 cmH20  Adult Ventilator Measurements  SpO2 95 %  Adult Ventilator Alarms  Alarms On Y  T Apnea 20 sec(s)  Daily Weaning Assessment  Daily Assessment of Readiness to Wean Wean protocol criteria not met  Reason not met (S)  Apnea (Pt took a couple of breaths and then paused > 8 seconds, changed back to full support. Pt is not ready to wean @ this time.)  Weaning Start Time 0831  Patient response (S)  Failed SBT terminated

## 2024-01-19 NOTE — Progress Notes (Signed)
 NAME:  Alexander [name], MRN:  [id number], DOB:  1961/03/28, LOS: 8 ADMISSION DATE:  01/11/2024, CONSULTATION DATE:  01/15/24 REFERRING MD:  Dr Elvan Hamel, CHIEF COMPLAINT:  vent management   History of Present Illness:  Pt is encephelopathic; therefore, this HPI is obtained from chart review. Alexander [name] is a 63 y.o. male who has a PMH as outlined below including a limited to PE/DVT on Coumadin .  Alexander [name] was admitted to [facility] on 01/11/2024 with rectal bleeding and hypotension.  Symptoms had started 1 day earlier and had persisted.   CT in the ED was concerning for diverticulitis versus colitis versus neoplasm.  Alexander [name] was started on Zosyn  and his Coumadin  was held.    Alexander [name] was evaluated by gastroenterology who recommended antibiotic therapy and conservative management in the interim with possible EGD/colonoscopy at a later date (possibly 4 to 6 weeks after completion of 10 days of antibiotics alone as Alexander [name] did not become transfusion dependent).  Unfortunately after his admission, Alexander [name] required multiple transfusions for persistent anemia and bleeding.  Alexander [name] had repeat CT scan on 4/15 that demonstrated ongoing inflammation and a large contained perforation without abscess and pneumoperitoneum, increased gaseous distention of small and large bowel distention consistent with ileus.  Alexander [name] was evaluated by general surgery who initially opted for conservative management.  Unfortunately Alexander [name] had ongoing abdominal pain and unfortunately 16, decision was made to go to the OR for further evaluation management.   Alexander [name] was taken to the OR and had an exploratory laparotomy where Alexander [name] was found to have a perforated ascending colon with intra-abdominal abscess and ultimately underwent with right colectomy without anastomosis, placement of wound VAC, and drainage of intra-abdominal abscesses.  Alexander [name] was left open and plan was for return trip to the OR on Friday/18.   Return to the ICU on the ventilator, and PCCM was asked to assist with ventilator and  medical management.  Pertinent  Medical History  has Elevated uric acid in blood; Chronic GERD; Chronic foot pain, right; Chronic pain of right ankle; Acute pulmonary embolism (HCC); DVT (deep venous thrombosis) (HCC); Essential hypertension, benign; Gout; Pulmonary embolus (HCC); Hepatic steatosis; Spinal stenosis; Aortic atherosclerosis (HCC); Peripheral arterial disease (HCC); Sleep disturbance; Diverticulosis; High risk medication use; S/P surgical manipulation of ankle joint; Hyperlipidemia; Noncompliance; Impaired fasting blood sugar; Chronic diarrhea; Pulmonary hypertension (HCC); Cor pulmonale (HCC); Chronic thromboembolic disease (HCC); Thrombocytopenia (HCC); History of pulmonary embolism; Diverticulitis; Symptomatic anemia; AKI (acute kidney injury) (HCC); Lower GI bleed; Iron  deficiency anemia; Diverticulitis of colon; ABLA (acute blood loss anemia); Hematochezia; Acute blood loss anemia; Rectal bleeding; Generalized abdominal pain; and Colon perforation (HCC) on their problem list.   Significant Hospital Events: Including procedures, antibiotic start and stop dates in addition to other pertinent events   4/14 admit 4/16 to OR for ex lap, returned to ICU on vent 4/17 cardiorespiratory arrest, achieved ROSC.  Coagulopathy 4/18 back to the OR 4/19 remains encephalopathic, 4/19 encephalopathic Back to the OR this morning-s/p exploratory laparotomy, ileostomy creation, small bowel resection, wound VAC in place  Antibiotics - Zosyn  4/12>>  Interim History / Subjective:  No overnight events Did receive 1 unit of packed red cells  Off propofol  this morning, easily arousable, moving right upper extremity-requiring restraints Pressor requirement improving  Attempted weaning this morning but failed  Objective   Blood pressure (!) 135/53, pulse 60, temperature 98.4 F (36.9 C), resp. rate 20, height 5\' 9"  (1.753 m), weight 92.6 kg, SpO2 94%.    Vent Mode: PRVC FiO2 (%):  [  40 %-45 %]  40 % Set Rate:  [20 bmp] 20 bmp Vt Set:  [560 mL] 560 mL PEEP:  [5 cmH20] 5 cmH20 Plateau Pressure:  [19 cmH20-25 cmH20] 19 cmH20   Intake/Output Summary (Last 24 hours) at 01/19/2024 0740 Last data filed at 01/19/2024 0700 Gross per 24 hour  Intake 3389.83 ml  Output 3642 ml  Net -252.17 ml   Filed Weights   01/18/24 1401 01/18/24 1841 01/19/24 0500  Weight: 95.1 kg 95.1 kg 92.6 kg    Examination: General: Middle-age, does not appear to be in distress, some jaundice HENT: Moist oral mucosa, endotracheal tube in place Lungs: Coarse breath sounds Cardiovascular: S1-S2 appreciated Abdomen: Postsurgical abdomen, open abdomen, ileostomy with output, bowel sounds appreciated drain in place Extremities: Bilateral edema Neuro: Responsive GU: poor output  I reviewed nursing notes, Consultant notes, hospitalist notes, last 24 h vitals and pain scores, last 48 h intake and output, last 24 h labs and trends, and last 24 h imaging results.  Resolved Hospital Problem list     Assessment & Plan:   Post cardiorespiratory arrest 01/15/2024 Coagulopathy corrected Shock -Pressor requirement improving  Transfusion events so far -11 packed red cells -1 platelet -6 FFP -2 cryoprecipitate -Kcentra  01/17/2024  Acute kidney injury On CRRT-started 01/18/2024 - Is making some urine currently on Lasix  drip at 8 mg an hour - Appreciate renal  Sepsis secondary to intra-abdominal catastrophe - On Zosyn   Shock - Pressor requirement improving - Attempting to wean off vasopressin  - On Levophed   Acute hypoxemic respiratory failure - Continue full vent support - Failed weaning this morning - VAP prevention  Acute hypoxemic respiratory failure - Continue full vent support - Pulmonary toileting  ABG noted - Alkalemia -decrease RR to 14  Shock liver/transaminitis,/hepatic congestion - Continue to monitor -LFTs improving  Hypocalcemia - Repleted  Anticoagulation remains on  hold  Left femoral head AVN necrosis - Will continue to follow   Discussed with Dr. Elvan Hamel of surgery - To start nutrition intravenously  Fluid removal with CRRT  Best Practice (right click and "Reselect all SmartList Selections" daily)   Diet/type: NPO DVT prophylaxis SCD Pressure ulcer(s): N/A GI prophylaxis: PPI Lines: Central line Foley:  Yes, and it is still needed Code Status:  full code Last date of multidisciplinary goals of care discussion [discussed with son at bedside and addressed for celebration on gradually.]  Labs   CBC: Recent Labs  Lab 01/15/24 0254 01/15/24 0940 01/15/24 2043 01/15/24 2045 01/16/24 0427 01/16/24 0911 01/16/24 1246 01/16/24 1552 01/17/24 0514 01/17/24 0757 01/17/24 1702 01/17/24 2222 01/17/24 2223 01/18/24 0230 01/18/24 0347 01/18/24 0726 01/18/24 1054 01/18/24 1548 01/18/24 2222 01/19/24 0541 01/19/24 0653  WBC 18.6*   < > 9.8  --  19.7*  --  14.9*  --  19.8*  --  20.6*  --   --   --   --   --   --   --   --   --   --   NEUTROABS 15.4*  --   --   --  16.6*  --  12.4*  --  16.7*  --  16.9*  --   --   --   --   --   --   --   --   --   --   HGB 8.4*   < > 7.2*   < > 4.0*   < > 8.1*   < > 7.3*   < > 8.3*   < >  --   --    < >  7.7* 8.9* 9.1* 6.9*  --  9.9*  HCT 26.7*   < > 23.1*   < > 12.7*   < > 26.4*   < > 20.3*   < > 23.8*   < >  --   --    < > 22.0* 26.2* 26.2* 21.0*  --  29.2*  MCV 80.7   < > 87.5  --  92.0  --  93.6  --  85.3  --  85.0  --   --   --   --   --   --   --   --   --   --   PLT 320   < > 239   < > 134*  127*  --  80*  80*   < > 152   < > 159  --  158 148*  --  141*  --   --   --  114*  --    < > = values in this interval not displayed.    Basic Metabolic Panel: Recent Labs  Lab 01/15/24 0254 01/15/24 1554 01/16/24 0427 01/16/24 0911 01/17/24 0514 01/17/24 1702 01/18/24 0424 01/18/24 1548 01/19/24 0108  NA 139   < > 141   < > 139 137 137 136 136  K 3.5   < > 6.0*   < > 5.0 5.2* 5.4* 5.4* 5.1  CL  102   < > 105  --  97* 93* 92* 89* 95*  CO2 26   < > 18*  --  23 25 27 27 28   GLUCOSE 115*   < > 186*  --  104* 108* 108* 104* 108*  BUN 24*   < > 35*  --  52* 61* 70* 79* 70*  CREATININE 0.97   < > 2.78*  --  3.88* 4.72* 5.38* 5.81* 4.38*  CALCIUM  8.4*   < > 6.9*  --  5.9* 5.8* 5.7* 5.7* 6.3*  MG 2.4  --  2.4  --  2.0  --  2.0  --  2.2  PHOS  --   --  8.3*  --  7.7*  --  9.2* 9.5* 6.4*   < > = values in this interval not displayed.   GFR: Estimated Creatinine Clearance: 19.9 mL/min (A) (by C-G formula based on SCr of 4.38 mg/dL (H)). Recent Labs  Lab 01/13/24 1234 01/13/24 1624 01/15/24 2042 01/15/24 2043 01/16/24 0427 01/16/24 1246 01/17/24 0514 01/17/24 1702  WBC  --    < >  --    < > 19.7* 14.9* 19.8* 20.6*  LATICACIDVEN 1.2  --  4.4*  --  >9.0*  --  7.0*  --    < > = values in this interval not displayed.    Liver Function Tests: Recent Labs  Lab 01/15/24 1554 01/15/24 2043 01/16/24 0427 01/17/24 1702 01/18/24 1548 01/19/24 0108  AST 21 24 90* 5,081*  --  1,044*  ALT 17 15 55* 2,239*  --  1,133*  ALKPHOS 33* 27* 20* 115  --  99  BILITOT 0.8 0.8 0.9 2.9*  --  2.8*  PROT 5.3* 5.2* 3.7* 4.0*  --  4.4*  ALBUMIN  2.6* 3.0* 2.2* 2.2* 2.0* 2.0*  1.9*   No results for input(s): "LIPASE", "AMYLASE" in the last 168 hours. No results for input(s): "AMMONIA" in the last 168 hours.  ABG    Component Value Date/Time   PHART 7.53 (H) 01/18/2024 0339   PCO2ART 37 01/18/2024 1610  PO2ART 88 01/18/2024 0339   HCO3 30.9 (H) 01/18/2024 0339   TCO2 15 (L) 01/16/2024 0911   ACIDBASEDEF 9.2 (H) 01/16/2024 1528   O2SAT 100 01/18/2024 0339     Coagulation Profile: Recent Labs  Lab 01/17/24 1457 01/17/24 2223 01/18/24 0230 01/18/24 0726 01/19/24 0541  INR 1.6* 1.7* 1.6* 1.4* 1.1    Cardiac Enzymes: No results for input(s): "CKTOTAL", "CKMB", "CKMBINDEX", "TROPONINI" in the last 168 hours.   HbA1C: Hgb A1c MFr Bld  Date/Time Value Ref Range Status  12/03/2023  02:37 PM 5.9 4.6 - 6.5 % Final    Comment:    Glycemic Control Guidelines for People with Diabetes:Non Diabetic:  <6%Goal of Therapy: <7%Additional Action Suggested:  >8%   01/22/2020 11:37 AM 5.2 4.8 - 5.6 % Final    Comment:             Prediabetes: 5.7 - 6.4          Diabetes: >6.4          Glycemic control for adults with diabetes: <7.0     CBG: Recent Labs  Lab 01/18/24 1549 01/18/24 1955 01/19/24 0004 01/19/24 0446 01/19/24 0729  GLUCAP 105* 99 107* 107* 101*    Review of Systems:     Past Medical History:  Alexander [name],  has a past medical history of Acute pulmonary embolism (HCC) (09/28/2019), Allergy, Aortic atherosclerosis (HCC) (09/2019), Arthritis, Chronic thromboembolic disease (HCC) (07/13/2023), Diverticulitis (2012), DVT (deep venous thrombosis) (HCC) (2019), DVT (deep venous thrombosis) (HCC) (09/2019), Gout (2007), History of gastroesophageal reflux (GERD), Hypertension (10/2019), Overweight (BMI 25.0-29.9), PAD (peripheral artery disease) (HCC) (09/2019), PE (pulmonary thromboembolism) (HCC) (09/2019), Pulmonary embolism (HCC) (2019), and Pulmonary hypertension (HCC) (07/12/2023).   Surgical History:   Past Surgical History:  Procedure Laterality Date   FOOT ARTHROPLASTY Right    HEMORRHOID SURGERY     IR ANGIOGRAM PULMONARY BILATERAL SELECTIVE  07/12/2023   IR ANGIOGRAM SELECTIVE EACH ADDITIONAL VESSEL  07/12/2023   IR ANGIOGRAM SELECTIVE EACH ADDITIONAL VESSEL  07/12/2023   IR INFUSION THROMBOL ARTERIAL INITIAL (MS)  07/12/2023   IR INFUSION THROMBOL ARTERIAL INITIAL (MS)  07/12/2023   IR THROMB F/U EVAL ART/VEN FINAL DAY (MS)  07/13/2023   IR US  GUIDE VASC ACCESS RIGHT  07/12/2023   LAPAROTOMY N/A 01/15/2024   Procedure: LAPAROTOMY, EXPLORATORY DRAINAGE INTRA- ABDOMINAL ABCESS RIGHT COLECTOMY PLACEMENT OF ABTHERA WOUND VAC;  Surgeon: [name], MD;  Location: [facility];  Service: General;  Laterality: N/A;  BOWEL RESECTION, POSSIBLE COLOSTOMY   LAPAROTOMY N/A  01/16/2024   Procedure: RE-EXPLORATION ABDOMEN, PLACEMENT OF ABTHERA WOUND VAC, SMALL BOWEL RESECTION;  Surgeon: [name], MD;  Location: [facility];  Service: General;  Laterality: N/A;  Possible SBR, Change abdominal vac     Social History:   reports that Alexander [name] has quit smoking. His smoking use included cigarettes. Alexander [name] has never used smokeless tobacco. Alexander [name] reports current alcohol use of about 3.0 standard drinks of alcohol per week. Alexander [name] reports that Alexander [name] does not use drugs.   Family History:  His family history includes Asthma in his sister; Cancer in his father; Diabetes in his brother, brother, maternal aunt, and paternal aunt; Gout in his mother; Heart disease in his mother; Hypertension in his brother, brother, brother, father, mother, sister, and sister; Stroke in his mother. There is no history of Colon cancer or Prostate cancer.   Allergies No Known Allergies   The patient is critically ill with multiple organ systems failure and requires high complexity decision  making for assessment and support, frequent evaluation and titration of therapies, application of advanced monitoring technologies and extensive interpretation of multiple databases. Critical Care Time devoted to patient care services described in this note independent of APP/resident time (if applicable)  is 50 minutes.   Myer Artis MD [facility] Personal pager: See Amion If unanswered, please page CCM On-call: #[phone]

## 2024-01-19 NOTE — Plan of Care (Signed)
  Problem: Safety: Goal: Ability to remain free from injury will improve Outcome: Progressing   Problem: Activity: Goal: Ability to tolerate increased activity will improve Outcome: Progressing   Problem: Respiratory: Goal: Ability to maintain a clear airway and adequate ventilation will improve Outcome: Progressing   Problem: Clinical Measurements: Goal: Ability to maintain clinical measurements within normal limits will improve Outcome: Progressing Goal: Will remain free from infection Outcome: Progressing Goal: Diagnostic test results will improve Outcome: Progressing Goal: Respiratory complications will improve Outcome: Progressing Goal: Cardiovascular complication will be avoided Outcome: Progressing

## 2024-01-19 NOTE — Plan of Care (Signed)
  Problem: Clinical Measurements: Goal: Ability to maintain clinical measurements within normal limits will improve Outcome: Progressing Goal: Will remain free from infection Outcome: Progressing Goal: Diagnostic test results will improve Outcome: Progressing Goal: Respiratory complications will improve Outcome: Progressing Goal: Cardiovascular complication will be avoided Outcome: Progressing   Problem: Elimination: Goal: Will not experience complications related to bowel motility Outcome: Progressing Goal: Will not experience complications related to urinary retention Outcome: Progressing   Problem: Pain Managment: Goal: General experience of comfort will improve and/or be controlled Outcome: Progressing   Problem: Safety: Goal: Ability to remain free from injury will improve Outcome: Progressing   Problem: Skin Integrity: Goal: Risk for impaired skin integrity will decrease Outcome: Progressing   Problem: Activity: Goal: Ability to tolerate increased activity will improve Outcome: Progressing   Problem: Respiratory: Goal: Ability to maintain a clear airway and adequate ventilation will improve Outcome: Progressing   Problem: Education: Goal: Knowledge of General Education information will improve Description: Including pain rating scale, medication(s)/side effects and non-pharmacologic comfort measures Outcome: Not Progressing   Problem: Nutrition: Goal: Adequate nutrition will be maintained Outcome: Not Progressing   Problem: Role Relationship: Goal: Method of communication will improve Outcome: Not Progressing

## 2024-01-19 NOTE — Progress Notes (Addendum)
 PHARMACY - TOTAL PARENTERAL NUTRITION CONSULT NOTE   Indication:  intolerance to enteral nutrition  Patient Measurements: Height: 5\' 9"  (175.3 cm) Weight: 92.6 kg (204 lb 2.3 oz) IBW/kg (Calculated) : 70.7 TPN AdjBW (KG): 80.7 Body mass index is 30.15 kg/m.  Assessment: 63 year old male intubated and sedated in the ICU. S/p multiple surgical procedures as detailed below. Patient with brief cardiac arrest 4/16 requiring 4 vasopressors and stress dose steroids. His vasopressor requirements have decreased. Decline in renal function with minimal UOP started on CRRT 4/19. Pharmacy to start TPN as patient is currently not a candidate for enteral feeding yet.  Glucose / Insulin : no hx DM, CBGs stable ~ 100 Electrolytes: on CRRT, slight hyperK, phos elevated; corrCa low (has received frequent Ca replacement) Renal: on CRRT Hepatic: LFTs elevated in setting of shock; alb 1.9 Intake / Output; MIVF:  -CRRT filtration per nephrology -Making some urine ~ 400 ml/24hr -Drain output 1050 ml yesterday  GI Imaging: 4/15 CT a/p 1. Persistent heterogeneous wall thickening of the right colon with surrounding inflammation and suspected enlarging contained perforation along the medial aspect of the ascending colon. As before, this may be secondary to diverticulitis, colitis or neoplasm. No organized fluid collection or pneumoperitoneum. General surgical consultation recommended. 2. Interval increased gastric distension with diffuse small and large bowel distension, most consistent with an ileus.  GI Surgeries / Procedures:  4/19 Re-exploration abdomen, small bowel resection, creation of end ileostomy, abd closure and placement of wound vac 4/17 Re-exploration abdomen, small bowel resection 4/16 Right colectomy, drainage of abscess  Central access: CVC TPN start date: anticipated 4/21  Nutritional Goals: Goal TPN rate is 105 mL/hr (provides 146 g of protein and 2378 kcals per day) **May need to  consider concentrating TPN to attempt to reduce volume**  RD Assessment: Estimated Needs Total Energy Estimated Needs: 2250-2450 Total Protein Estimated Needs: 130-160g Total Fluid Estimated Needs: >/= 2L  Current Nutrition: NPO  Plan:  -Pending stability of labs and clinical status, will plan to start TPN at ~ 40 mL/hr at 1800 on 4/21 -Electrolytes in TPN subject to change: Na 50 mEq/L, K 50 mEq/L, Ca 5 mEq/L, Mg 5 mEq/L, and Phos 15 mmol/L. Cl:Ac 1:1 -Add standard MVI and trace elements to TPN -Will likely plan to add SSI -Monitor TPN labs on Mon/Thurs, more frequently as clinically necessary  Lolita Rise, PharmD, BCPS Clinical Pharmacist 01/19/2024 2:18 PM

## 2024-01-19 NOTE — Progress Notes (Signed)
 [facility] Progress Note  Subjective:  Seen in ICU UOP 100-200 yest I/O yesterday: +594 I/O: +26 L since admit Pressors: vaso 0.3, levo 6  Labs ok, K+ 5.1 down, CO2 28, creat down 4.3 IV bicarb gtt dc'd yest  Vitals:   01/19/24 0900 01/19/24 1000 01/19/24 1100 01/19/24 1200  BP:      Pulse: 63 63 65 63  Resp: 18 12 14 16   Temp: 98.1 F (36.7 C) 98.2 F (36.8 C) 98.4 F (36.9 C) 98.2 F (36.8 C)  TempSrc:      SpO2: 94% 96% 97% 97%  Weight:      Height:        Exam: Gen on vent, sedated Sclera anicteric, throat w/ ETT No jvd or bruits Chest clear anterior/ lateral RRR no MRG Abd ileostomy R mid abdomen, wound vac in place in midline GU foley draining small amts urine Ext diffuse LE and UE 2+ edema, including scrotum Neuro is on vent, sedated      Renal-related home meds: Others: coumadin , miralax     UA 4/13 admit - negative, no protein   UA 4/19 - 21-50 rbc, 0-5 wbc   UNa 99, UCreat 16     Renal US   10-11 cm kidneys, no hydro    OR 4/16: R colectomy, drainage abd abscess    OR 4/18: creation of ileostomy, small bowel resection, wound vac   Assessment/ Plan: AKI - mostly anuric, creat 1.0 early in this hospital stay. After onset of severe shock 4/16 pt had progressive AKI. UA negative. Renal US  shows no obstruction. AKI due to ATN primarily due to shock +/- IV contrast. Oliguric. CRRT started 4/19. Cont CRRT.  Volume overload: severe. Keep net neg 50-75 cc/hr as tolerated   S/P cardiac arrest: 4/17 Shock/ hypotension: remains on 2 pressors   Metabolic acidosis: resolved w/ CRRT.  IV bicarb gtt was deleted.  Acute diverticulitis: w/ perforated ascending colon, s/p ileostomy, R hemicolectomy and some small bowel resection. Per gen surg/ pmd.  VDRF: per CCM     Larry Poag MD  [facility] 01/19/2024, 1:37 PM  Recent Labs  Lab 01/18/24 1548 01/18/24 2222 01/19/24 0108 01/19/24 0653 01/19/24 1018  HGB 9.1*   < >  --  9.9* 9.6*  ALBUMIN  2.0*  --   2.0*  1.9*  --   --   CALCIUM  5.7*  --  6.3*  --   --   PHOS 9.5*  --  6.4*  --   --   CREATININE 5.81*  --  4.38*  --   --   K 5.4*  --  5.1  --   --    < > = values in this interval not displayed.   No results for input(s): "IRON ", "TIBC", "FERRITIN" in the last 168 hours. Inpatient medications:  Chlorhexidine  Gluconate Cloth  6 each Topical Daily   hydrocortisone  sod succinate (SOLU-CORTEF ) inj  50 mg Intravenous Daily   mouth rinse  15 mL Mouth Rinse Q2H    sodium chloride  10 mL/hr at 01/19/24 1300   epinephrine  Stopped (01/16/24 2222)   fentaNYL  infusion INTRAVENOUS 100 mcg/hr (01/19/24 1300)   furosemide  (LASIX ) 200 mg in dextrose  5 % 100 mL (2 mg/mL) infusion 8 mg/hr (01/19/24 1300)   heparin  10,000 units/ 20 mL infusion syringe 500 Units/hr (01/19/24 1300)   methylene blue  (antidote) 161 mg in dextrose  5 % 50 mL IVPB     norepinephrine  (LEVOPHED ) Adult infusion 6 mcg/min (01/19/24 1300)  phenylephrine  (NEO-SYNEPHRINE) Adult infusion Stopped (01/17/24 1213)   piperacillin -tazobactam Stopped (01/19/24 0810)   prismasol  BGK 4/2.5 1,500 mL/hr at 01/19/24 1202   prismasol  BGK 4/2.5 400 mL/hr at 01/19/24 0424   prismasol  BGK 4/2.5 400 mL/hr at 01/19/24 0423   propofol  (DIPRIVAN ) infusion Stopped (01/19/24 0709)   vasopressin  0.03 Units/min (01/19/24 1300)   sodium chloride , fentaNYL , fentaNYL  (SUBLIMAZE ) injection, fentaNYL  (SUBLIMAZE ) injection, heparin , labetalol , mouth rinse, mouth rinse

## 2024-01-20 ENCOUNTER — Encounter (HOSPITAL_COMMUNITY): Payer: Self-pay | Admitting: General Surgery

## 2024-01-20 DIAGNOSIS — R7401 Elevation of levels of liver transaminase levels: Secondary | ICD-10-CM

## 2024-01-20 DIAGNOSIS — R571 Hypovolemic shock: Secondary | ICD-10-CM

## 2024-01-20 DIAGNOSIS — I469 Cardiac arrest, cause unspecified: Secondary | ICD-10-CM | POA: Diagnosis not present

## 2024-01-20 DIAGNOSIS — J9601 Acute respiratory failure with hypoxia: Secondary | ICD-10-CM | POA: Diagnosis not present

## 2024-01-20 DIAGNOSIS — D696 Thrombocytopenia, unspecified: Secondary | ICD-10-CM

## 2024-01-20 DIAGNOSIS — K631 Perforation of intestine (nontraumatic): Secondary | ICD-10-CM | POA: Diagnosis not present

## 2024-01-20 LAB — POCT I-STAT 7, (LYTES, BLD GAS, ICA,H+H)
Acid-Base Excess: 7 mmol/L — ABNORMAL HIGH (ref 0.0–2.0)
Acid-Base Excess: 5 mmol/L — ABNORMAL HIGH (ref 0.0–2.0)
Bicarbonate: 31.6 mmol/L — ABNORMAL HIGH (ref 20.0–28.0)
Bicarbonate: 29.8 mmol/L — ABNORMAL HIGH (ref 20.0–28.0)
Calcium, Ion: 0.66 mmol/L — CL (ref 1.15–1.40)
Calcium, Ion: 0.72 mmol/L — CL (ref 1.15–1.40)
HCT: 20 % — ABNORMAL LOW (ref 39.0–52.0)
HCT: 22 % — ABNORMAL LOW (ref 39.0–52.0)
Hemoglobin: 6.8 g/dL — CL (ref 13.0–17.0)
Hemoglobin: 7.5 g/dL — ABNORMAL LOW (ref 13.0–17.0)
O2 Saturation: 100 %
O2 Saturation: 96 %
Patient temperature: 36.6
Patient temperature: 36.6
Potassium: 5 mmol/L (ref 3.5–5.1)
Potassium: 5.1 mmol/L (ref 3.5–5.1)
Sodium: 135 mmol/L (ref 135–145)
Sodium: 134 mmol/L — ABNORMAL LOW (ref 135–145)
TCO2: 33 mmol/L — ABNORMAL HIGH (ref 22–32)
TCO2: 31 mmol/L (ref 22–32)
pCO2 arterial: 44.3 mmHg (ref 32–48)
pCO2 arterial: 45.4 mmHg (ref 32–48)
pH, Arterial: 7.46 — ABNORMAL HIGH (ref 7.35–7.45)
pH, Arterial: 7.424 (ref 7.35–7.45)
pO2, Arterial: 256 mmHg — ABNORMAL HIGH (ref 83–108)
pO2, Arterial: 82 mmHg — ABNORMAL LOW (ref 83–108)

## 2024-01-20 LAB — COMPREHENSIVE METABOLIC PANEL WITH GFR
ALT: 666 U/L — ABNORMAL HIGH (ref 0–44)
AST: 340 U/L — ABNORMAL HIGH (ref 15–41)
Albumin: 1.9 g/dL — ABNORMAL LOW (ref 3.5–5.0)
Alkaline Phosphatase: 95 U/L (ref 38–126)
Anion gap: 9 (ref 5–15)
BUN: 49 mg/dL — ABNORMAL HIGH (ref 8–23)
CO2: 29 mmol/L (ref 22–32)
Calcium: 7 mg/dL — ABNORMAL LOW (ref 8.9–10.3)
Chloride: 101 mmol/L (ref 98–111)
Creatinine, Ser: 3.03 mg/dL — ABNORMAL HIGH (ref 0.61–1.24)
GFR, Estimated: 23 mL/min — ABNORMAL LOW (ref >60)
Glucose, Bld: 96 mg/dL (ref 70–99)
Potassium: 4.4 mmol/L (ref 3.5–5.1)
Sodium: 139 mmol/L (ref 135–145)
Total Bilirubin: 2.6 mg/dL — ABNORMAL HIGH (ref 0.0–1.2)
Total Protein: 5 g/dL — ABNORMAL LOW (ref 6.5–8.1)

## 2024-01-20 LAB — PREPARE FRESH FROZEN PLASMA: Unit division: 0

## 2024-01-20 LAB — DIC (DISSEMINATED INTRAVASCULAR COAGULATION)PANEL
D-Dimer, Quant: 8.92 ug{FEU}/mL — ABNORMAL HIGH (ref 0.00–0.50)
Fibrinogen: 344 mg/dL (ref 210–475)
INR: 1 (ref 0.8–1.2)
Platelets: 103 10*3/uL — ABNORMAL LOW (ref 150–400)
Prothrombin Time: 13.8 s (ref 11.4–15.2)
Smear Review: NONE SEEN
aPTT: 33 s (ref 24–36)

## 2024-01-20 LAB — BPAM RBC
Blood Product Expiration Date: 202505102359
Blood Product Expiration Date: 202505142359
Blood Product Expiration Date: 202505142359
Blood Product Expiration Date: 202505142359
Blood Product Expiration Date: 202505142359
Blood Product Expiration Date: 202505142359
Blood Product Expiration Date: 202505142359
Blood Product Expiration Date: 202505162359
Blood Product Expiration Date: 202505162359
Blood Product Expiration Date: 202505202359
Blood Product Expiration Date: 202505222359
Blood Product Expiration Date: 202505222359
Blood Product Expiration Date: 202505222359
Blood Product Expiration Date: 202505232359
Blood Product Unit Number: 202505162359
ISSUE DATE / TIME: 202504161704
ISSUE DATE / TIME: 202504162023
ISSUE DATE / TIME: 202504162023
ISSUE DATE / TIME: 202504170654
ISSUE DATE / TIME: 202504170654
ISSUE DATE / TIME: 202504170654
ISSUE DATE / TIME: 202504170920
ISSUE DATE / TIME: 202504170920
ISSUE DATE / TIME: 202504170920
ISSUE DATE / TIME: 202504171635
ISSUE DATE / TIME: 202504180944
ISSUE DATE / TIME: 202504190752
ISSUE DATE / TIME: 202504190937
ISSUE DATE / TIME: 202504210002
ISSUE DATE / TIME: 202505162359
ISSUE DATE / TIME: 202504200154
Unit Type and Rh: 1700
Unit Type and Rh: 1700
Unit Type and Rh: 1700
Unit Type and Rh: 202505162359
Unit Type and Rh: 202505162359
Unit Type and Rh: 202505202359
Unit Type and Rh: 5100
Unit Type and Rh: 5100
Unit Type and Rh: 5100
Unit Type and Rh: 7300
Unit Type and Rh: 7300
Unit Type and Rh: 7300
Unit Type and Rh: 7300
Unit Type and Rh: 7300
Unit Type and Rh: 7300
Unit Type and Rh: 7300
Unit Type and Rh: 7300
Unit Type and Rh: 7300
Unit Type and Rh: 7300

## 2024-01-20 LAB — TYPE AND SCREEN
ABO/RH(D): B POS
Antibody Screen: NEGATIVE
Unit division: 0
Unit division: 0
Unit division: 0
Unit division: 0
Unit division: 0
Unit division: 0
Unit division: 0
Unit division: 0
Unit division: 0
Unit division: 0
Unit division: 0
Unit division: 0
Unit division: 0
Unit division: 0
Unit division: 0
Unit division: 0

## 2024-01-20 LAB — RENAL FUNCTION PANEL
Albumin: 1.8 g/dL — ABNORMAL LOW (ref 3.5–5.0)
Albumin: 1.8 g/dL — ABNORMAL LOW (ref 3.5–5.0)
Anion gap: 8 (ref 5–15)
Anion gap: 9 (ref 5–15)
BUN: 48 mg/dL — ABNORMAL HIGH (ref 8–23)
BUN: 45 mg/dL — ABNORMAL HIGH (ref 8–23)
CO2: 30 mmol/L (ref 22–32)
CO2: 28 mmol/L (ref 22–32)
Calcium: 7.1 mg/dL — ABNORMAL LOW (ref 8.9–10.3)
Calcium: 7.2 mg/dL — ABNORMAL LOW (ref 8.9–10.3)
Chloride: 103 mmol/L (ref 98–111)
Chloride: 102 mmol/L (ref 98–111)
Creatinine, Ser: 2.89 mg/dL — ABNORMAL HIGH (ref 0.61–1.24)
Creatinine, Ser: 2.64 mg/dL — ABNORMAL HIGH (ref 0.61–1.24)
GFR, Estimated: 24 mL/min — ABNORMAL LOW (ref >60)
GFR, Estimated: 27 mL/min — ABNORMAL LOW (ref >60)
Glucose, Bld: 92 mg/dL (ref 70–99)
Glucose, Bld: 96 mg/dL (ref 70–99)
Phosphorus: 4.2 mg/dL (ref 2.5–4.6)
Phosphorus: 5 mg/dL — ABNORMAL HIGH (ref 2.5–4.6)
Potassium: 4.5 mmol/L (ref 3.5–5.1)
Potassium: 4.9 mmol/L (ref 3.5–5.1)
Sodium: 141 mmol/L (ref 135–145)
Sodium: 139 mmol/L (ref 135–145)

## 2024-01-20 LAB — TRIGLYCERIDES: Triglycerides: 230 mg/dL — ABNORMAL HIGH (ref <150)

## 2024-01-20 LAB — CBC
HCT: 29.3 % — ABNORMAL LOW (ref 39.0–52.0)
Hemoglobin: 9.8 g/dL — ABNORMAL LOW (ref 13.0–17.0)
MCH: 29.8 pg (ref 26.0–34.0)
MCHC: 33.4 g/dL (ref 30.0–36.0)
MCV: 89.1 fL (ref 80.0–100.0)
Platelets: 103 10*3/uL — ABNORMAL LOW (ref 150–400)
RBC: 3.29 MIL/uL — ABNORMAL LOW (ref 4.22–5.81)
RDW: 17.2 % — ABNORMAL HIGH (ref 11.5–15.5)
WBC: 14.9 10*3/uL — ABNORMAL HIGH (ref 4.0–10.5)
nRBC: 8.1 % — ABNORMAL HIGH (ref 0.0–0.2)

## 2024-01-20 LAB — BPAM FFP
Blood Product Expiration Date: 202504232359
ISSUE DATE / TIME: 202504181202
Unit Type and Rh: 8400

## 2024-01-20 LAB — GLUCOSE, CAPILLARY
Glucose-Capillary: 96 mg/dL (ref 70–99)
Glucose-Capillary: 73 mg/dL (ref 70–99)
Glucose-Capillary: 80 mg/dL (ref 70–99)
Glucose-Capillary: 83 mg/dL (ref 70–99)
Glucose-Capillary: 86 mg/dL (ref 70–99)
Glucose-Capillary: 97 mg/dL (ref 70–99)
Glucose-Capillary: 123 mg/dL — ABNORMAL HIGH (ref 70–99)

## 2024-01-20 LAB — PHOSPHORUS: Phosphorus: 4.5 mg/dL (ref 2.5–4.6)

## 2024-01-20 MED ORDER — TRACE MINERALS CU-MN-SE-ZN 300-55-60-3000 MCG/ML IV SOLN
INTRAVENOUS | Status: AC
  Filled 2024-01-20: qty 371.2

## 2024-01-20 MED ORDER — DEXMEDETOMIDINE HCL IN NACL 200 MCG/50ML IV SOLN
0.0000 ug/kg/h | INTRAVENOUS | Status: DC
  Administered 2024-01-20 (×3): 0.4 ug/kg/h via INTRAVENOUS
  Administered 2024-01-21: 0.2 ug/kg/h via INTRAVENOUS
  Administered 2024-01-21 – 2024-01-22 (×3): 0.4 ug/kg/h via INTRAVENOUS
  Filled 2024-01-20 (×7): qty 50

## 2024-01-20 MED ORDER — INSULIN ASPART 100 UNIT/ML IJ SOLN
0.0000 [IU] | Freq: Four times a day (QID) | INTRAMUSCULAR | Status: AC
  Administered 2024-01-20 – 2024-01-23 (×8): 1 [IU] via SUBCUTANEOUS

## 2024-01-20 MED ORDER — IPRATROPIUM-ALBUTEROL 0.5-2.5 (3) MG/3ML IN SOLN
3.0000 mL | RESPIRATORY_TRACT | Status: DC | PRN
  Administered 2024-01-20: 3 mL via RESPIRATORY_TRACT
  Filled 2024-01-20: qty 3

## 2024-01-20 MED ORDER — CALCIUM GLUCONATE-NACL 1-0.675 GM/50ML-% IV SOLN
1.0000 g | Freq: Once | INTRAVENOUS | Status: AC
  Administered 2024-01-20: 1000 mg via INTRAVENOUS
  Filled 2024-01-20: qty 50

## 2024-01-20 NOTE — Progress Notes (Signed)
 Nutrition Follow-up  DOCUMENTATION CODES:   Not applicable  INTERVENTION:  - TPN to start tonight.   - TPN management per pharmacy.  - Monitor magnesium, potassium, and phosphorus BID for at least 3 days, MD to replete as needed, as pt is at risk for refeeding syndrome given minimal/no nutrition intake for at least 9 days. -Add 100mg  Thiamine  x5 days.  - Daily weights while on TPN.   NUTRITION DIAGNOSIS:   Inadequate oral intake related to inability to eat as evidenced by NPO status. *ongoing  GOAL:   Patient will meet greater than or equal to 90% of their needs *progressing, starting TPN  MONITOR:   Vent status, Labs, Weight trends  REASON FOR ASSESSMENT:   Consult New TPN/TNA  ASSESSMENT:   63 y.o. male who has a PMH including HTN, PAD, diverticulitis who was admitted with rectal bleeding and hypotension.  4/12 Admit 4/13 CLD 4/15 CT a/p showing suspicion for contained colon perforation and ileus 4/16 OR for ex-lap, right colectomy, wound vac placement; returned to ICU intubated 4/17 OR for re-exploration of abdomen, wound vac placement, small bowel resection, left in discontinuity  4/19 s/p ex lap, ileostomy creation, SB resection, Wound VAC; CRRT initiated 4/21 Initiating TPN  Patient receiving care from RN's at time of visit, no family present.   Patient started on CRRT 4/19. He has remained NPO with no nutrition since admit 9 days ago.  Per CCS note today, patient with high OGT output so still not ready for tube feeds. Initiating TPN today    Admission weight: 177# Current weight: 194# I&O's: +22.6L since admit + for moderate pitting generalized edema and deep pitting RLE/LLE edema   Medications:  Precedex  Fentanyl  Levophed  @ 9 mcg/min   Labs reviewed: Creatinine 3.03 Triglycerides 230 (as of 4/21)   Diet Order:   Diet Order             Diet NPO time specified Except for: Ice Chips  Diet effective now                   EDUCATION  NEEDS:  Education needs have been addressed  Skin:  Skin Assessment: Skin Integrity Issues: Skin Integrity Issues:: Incisions, Other (Comment) Incisions: Abdomen Other: Several skin tears  Last BM:  4/20  Height:  Ht Readings from Last 1 Encounters:  01/20/24 5\' 9"  (1.753 m)   Weight:  Wt Readings from Last 1 Encounters:  01/20/24 88 kg   Ideal Body Weight:  72.73 kg  BMI:  Body mass index is 28.65 kg/m.  Estimated Nutritional Needs:  Kcal:  1610-9604 Protein:  130-160g Fluid:  >/= 2L    Scheryl Cushing RD, LDN Contact via Secure Chat.

## 2024-01-20 NOTE — TOC Progression Note (Signed)
 Transition of Care [facility]) - Progression Note    Patient Details  Name: Alexander [name] MRN: [id number] Date of Birth: 1961-05-30  Transition of Care [facility]) CM/SW Contact  Bari Leys, RN Phone Number: 01/20/2024, 11:19 AM  Clinical Narrative:  Remains in ICU on vent, await bowel function return, wound vac in place, start TPN. TOC will continue to follow for dc needs.      Expected Discharge Plan: Home w Home Health Services Barriers to Discharge: Continued Medical Work up  Expected Discharge Plan and Services                                               Social Determinants of Health (SDOH) Interventions SDOH Screenings   Food Insecurity: No Food Insecurity (01/12/2024)  Housing: Low Risk  (01/12/2024)  Transportation Needs: No Transportation Needs (01/12/2024)  Utilities: Not At Risk (01/12/2024)  Depression (PHQ2-9): Low Risk  (07/24/2023)  Tobacco Use: Medium Risk (01/15/2024)    Readmission Risk Interventions    01/13/2024    9:13 AM 07/11/2023    2:11 PM  Readmission Risk Prevention Plan  Post Dischage Appt  Complete  Medication Screening  Complete  Transportation Screening Complete Complete  PCP or Specialist Appt within 3-5 Days Complete   HRI or Home Care Consult Complete   Social Work Consult for Recovery Care Planning/Counseling Complete   Palliative Care Screening Not Applicable   Medication Review Oceanographer) Complete

## 2024-01-20 NOTE — Progress Notes (Signed)
 Patient ID: Alexander [name], male   DOB: January 23, 1961, 63 y.o.   MRN: [id number]   Follow up - Emergency General Surgery  Patient Details:    Alexander [name] is an 63 y.o. male.  Lines/tubes : Airway 7.5 mm (Active)  Secured at (cm) 23 cm 01/19/24 0329  Measured From Lips 01/19/24 0329  Secured Location Right 01/19/24 0329  Secured By [employer] 01/19/24 0329  Bite Block No 01/19/24 0329  Tube Holder Repositioned Yes 01/19/24 0329  Prone position No 01/19/24 0329  Head position Right 01/18/24 2000  Cuff Pressure (cm H2O) Clear OR 27-39 CmH2O 01/19/24 0329  Site Condition Dry 01/19/24 0329     CVC Double Lumen 01/15/24 Right Internal jugular 16 cm (Active)  Indication for Insertion or Continuance of Line Vasoactive infusions 01/19/24 0400  Site Assessment Clean, Dry, Intact 01/19/24 0400  Proximal Lumen Status Infusing 01/19/24 0400  Distal Lumen Status Infusing 01/19/24 0400  Dressing Type Transparent 01/19/24 0400  Dressing Status Antimicrobial disc/dressing in place;Clean;Dry 01/19/24 0400  Line Care Connections checked and tightened 01/19/24 0400  Dressing Intervention Dressing changed 01/19/24 0545  Dressing Change Due 01/26/24 01/19/24 0545     Arterial Line 01/15/24 Left Radial (Active)  Site Assessment Clean, Dry, Intact 01/19/24 0400  Line Status Pulsatile blood flow 01/19/24 0400  Art Line Waveform Appropriate 01/19/24 0400  Art Line Interventions Connections checked and tightened;Flushed per protocol;Line pulled back 01/19/24 0400  Color/Movement/Sensation Capillary refill less than 3 sec 01/19/24 0400  Dressing Type Transparent 01/19/24 0400  Dressing Status Clean, Dry, Intact;Antimicrobial disc in place 01/19/24 0400  Interventions Other (Comment) 01/19/24 0400  Dressing Change Due 01/22/24 01/19/24 0400     Closed System Drain LLQ Bulb (JP) 19 Fr. (Active)  Site Description Unable to view 01/18/24 2000  Dressing Status Clean, Dry, Intact 01/18/24 2000   Drainage Appearance Bright red;Serosanguineous 01/18/24 2000  Status To gravity (Uncharged) 01/18/24 2000  Output (mL) 20 mL 01/19/24 0800     Negative Pressure Wound Therapy Abdomen Medial;Upper (Active)  Site / Wound Assessment Dressing in place / Unable to assess 01/18/24 2000  Wound filler - Black foam 1 01/18/24 0900  Target Pressure (mmHg) 125 01/18/24 2000  Dressing Status Intact 01/18/24 2000  Drainage Amount Minimal 01/18/24 2000  Drainage Description Sanguineous 01/18/24 2000  Output (mL) 0 mL 01/19/24 0800     NG/OG Vented/Dual Lumen 18 Fr. Oral (Active)  Tube Position (Required) Marking at nare/corner of mouth 01/19/24 0745  Measurement (cm) (Required) 60 cm 01/18/24 2000  Ongoing Placement Verification (Required) (See row information) Yes 01/19/24 0745  Site Assessment Clean, Dry, Intact 01/19/24 0745  Interventions Other (Comment) 01/17/24 0515  Status Low intermittent suction 01/19/24 0745  Amount of suction 55 mmHg 01/18/24 2000  Drainage Appearance Bile;Brown 01/19/24 0745  Intake (mL) 60 mL 01/18/24 0020  Output (mL) 28 mL 01/19/24 0800     Ileostomy Standard (end) RUQ (Active)  Ostomy Pouch 1 piece 01/19/24 0745  Stoma Assessment Red 01/19/24 0745  Peristomal Assessment Intact 01/19/24 0745  Output (mL) 0 mL 01/19/24 0800     Urethral Catheter JAMIE BANKS RN Latex 14 Fr. (Active)  Indication for Insertion or Continuance of Catheter Therapy based on hourly urine output monitoring and documentation for critical condition (NOT STRICT I&O) 01/19/24 0745  Site Assessment Clean, Dry, Intact;Swelling 01/19/24 0745  Catheter Maintenance Bag below level of bladder;Catheter secured;Drainage bag/tubing not touching floor;Insertion date on drainage bag;No dependent loops;Seal intact 01/19/24 0745  Collection Container Standard  drainage bag 01/19/24 0745  Securement Method Adhesive securement device 01/19/24 0745  Urinary Catheter Interventions (if applicable) Unclamped  01/18/24 2000  Input (mL) 10 mL 01/16/24 2047  Output (mL) 11 mL 01/19/24 0800     Fecal Management System 30 mL (Active)  Does patient meet criteria for removal? No 01/19/24 0745  Daily care Skin around tube assessed;Assess location of position indicator line;Bag changed (every 24 hours) 01/19/24 0745  Patient Indicator Assessment Green 01/19/24 0745  Bulb Deflated and Reinflated Yes 01/19/24 0745  Amount in bulb 35 mL 01/19/24 0745  Output (mL) 0 mL 01/19/24 0800    Microbiology/Sepsis markers: Results for orders placed or performed during the hospital encounter of 01/11/24  Culture, blood (single)     Status: Abnormal (Preliminary result)   Collection Time: 01/11/24  7:57 PM   Specimen: BLOOD  Result Value Ref Range Status   Specimen Description   Final    BLOOD BLOOD RIGHT ARM Performed at [facility], [address], [place], [place] [address]    Special Requests   Final    BOTTLES DRAWN AEROBIC AND ANAEROBIC Blood Culture adequate volume Performed at [facility], [address] [place], [place] [address]    Culture  Setup Time   Final    GRAM POSITIVE RODS ANAEROBIC BOTTLE ONLY CRITICAL RESULT CALLED TO, READ BACK BY AND VERIFIED WITH: PHARMD E JACKSON 01/16/2024 @ 0350 BY AB    Culture (A)  Final    ANAEROBIC GRAM POSITIVE RODS CULTURE REINCUBATED FOR BETTER GROWTH Performed at [facility], [address] [place], [place] [address]    Report Status PENDING  Incomplete  MRSA Next Gen by PCR, Nasal     Status: Abnormal   Collection Time: 01/12/24 12:06 AM   Specimen: Nasal Mucosa; Nasal Swab  Result Value Ref Range Status   MRSA by PCR Next Gen DETECTED (A) NOT DETECTED Corrected    Comment: CRITICAL RESULT CALLED TO, READ BACK BY AND VERIFIED WITH: Godwin Lat, RN [phone] 01/12/24 MH (NOTE) The GeneXpert MRSA Assay (FDA approved for NASAL specimens only), is one component of a comprehensive MRSA colonization  surveillance program. It is not intended to diagnose MRSA infection nor to guide or monitor treatment for MRSA infections. Test performance is not FDA approved in patients less than 52 years old. Performed at [facility], [address] [place], [place] [address] CORRECTED ON 04/13 AT 0255: PREVIOUSLY REPORTED AS DETECTED CRITICAL RESULT CALLED TO, READ BACK BY AND VERIFIED WITH: Godwin Lat, RN [phone] 01/12/24 MH   Resp panel by RT-PCR (RSV, Flu A&B, Covid) Anterior Nasal Swab     Status: None   Collection Time: 01/12/24  9:53 AM   Specimen: Anterior Nasal Swab  Result Value Ref Range Status   SARS Coronavirus 2 by RT PCR NEGATIVE NEGATIVE Final    Comment: (NOTE) SARS-CoV-2 target nucleic acids are NOT DETECTED.  The SARS-CoV-2 RNA is generally detectable in upper respiratory specimens during the acute phase of infection. The lowest concentration of SARS-CoV-2 viral copies this assay can detect is 138 copies/mL. A negative result does not preclude SARS-Cov-2 infection and should not be used as the sole basis for treatment or other patient management decisions. A negative result may occur with  improper specimen collection/handling, submission of specimen other than nasopharyngeal swab, presence of viral mutation(s) within the areas targeted by this assay, and inadequate number of viral copies(<138 copies/mL). A negative result must be  combined with clinical observations, patient history, and epidemiological information. The expected result is Negative.  Fact Sheet for Patients:  BloggerCourse.com  Fact Sheet for Healthcare Providers:  SeriousBroker.it  This test is no t yet approved or cleared by the [place]  FDA and  has been authorized for detection and/or diagnosis of SARS-CoV-2 by FDA under an Emergency Use Authorization (EUA). This EUA will remain  in effect (meaning this test can be used) for the  duration of the COVID-19 declaration under Section 564(b)(1) of the Act, 21 U.S.C.section 360bbb-3(b)(1), unless the authorization is terminated  or revoked sooner.       Influenza A by PCR NEGATIVE NEGATIVE Final   Influenza B by PCR NEGATIVE NEGATIVE Final    Comment: (NOTE) The Xpert Xpress SARS-CoV-2/FLU/RSV plus assay is intended as an aid in the diagnosis of influenza from Nasopharyngeal swab specimens and should not be used as a sole basis for treatment. Nasal washings and aspirates are unacceptable for Xpert Xpress SARS-CoV-2/FLU/RSV testing.  Fact Sheet for Patients: BloggerCourse.com  Fact Sheet for Healthcare Providers: SeriousBroker.it  This test is not yet approved or cleared by the [place]  FDA and has been authorized for detection and/or diagnosis of SARS-CoV-2 by FDA under an Emergency Use Authorization (EUA). This EUA will remain in effect (meaning this test can be used) for the duration of the COVID-19 declaration under Section 564(b)(1) of the Act, 21 U.S.C. section 360bbb-3(b)(1), unless the authorization is terminated or revoked.     Resp Syncytial Virus by PCR NEGATIVE NEGATIVE Final    Comment: (NOTE) Fact Sheet for Patients: BloggerCourse.com  Fact Sheet for Healthcare Providers: SeriousBroker.it  This test is not yet approved or cleared by the [place]  FDA and has been authorized for detection and/or diagnosis of SARS-CoV-2 by FDA under an Emergency Use Authorization (EUA). This EUA will remain in effect (meaning this test can be used) for the duration of the COVID-19 declaration under Section 564(b)(1) of the Act, 21 U.S.C. section 360bbb-3(b)(1), unless the authorization is terminated or revoked.  Performed at [facility], [address], [place], [place] [address]   Culture, blood (Routine X 2) w Reflex to ID  Panel     Status: None   Collection Time: 01/13/24  7:16 AM   Specimen: BLOOD LEFT HAND  Result Value Ref Range Status   Specimen Description   Final    BLOOD LEFT HAND Performed at [facility], [address], [place], [place] [address]    Special Requests   Final    BOTTLES DRAWN AEROBIC AND ANAEROBIC Blood Culture results may not be optimal due to an inadequate volume of blood received in culture bottles Performed at [facility], [address], [place], [place] [address]    Culture   Final    NO GROWTH 5 DAYS Performed at [facility], [address] [place], [place] [address]    Report Status 01/18/2024 FINAL  Final  Culture, blood (Routine X 2) w Reflex to ID Panel     Status: None   Collection Time: 01/13/24  7:16 AM   Specimen: BLOOD RIGHT ARM  Result Value Ref Range Status   Specimen Description   Final    BLOOD RIGHT ARM Performed at [facility], [address], [place], [place] [address]    Special Requests   Final    BOTTLES DRAWN AEROBIC AND ANAEROBIC Blood Culture results may not be optimal due to an  inadequate volume of blood received in culture bottles Performed at [facility], [address] [place], [place] [address]    Culture   Final    NO GROWTH 5 DAYS Performed at [facility], [address], [place], [place] [address]    Report Status 01/18/2024 FINAL  Final    Anti-infectives:  Anti-infectives (From admission, onward)    Start     Dose/Rate Route Frequency Ordered Stop   01/18/24 1400  piperacillin -tazobactam (ZOSYN ) IVPB 3.375 g  Status:  Discontinued        3.375 g 12.5 mL/hr over 240 Minutes Intravenous Every 6 hours 01/18/24 1241 01/18/24 1242   01/18/24 1400  piperacillin -tazobactam (ZOSYN ) IVPB 3.375 g        3.375 g 100 mL/hr over 30 Minutes Intravenous Every 6 hours 01/18/24 1242     01/18/24 0000  piperacillin -tazobactam (ZOSYN ) IVPB 2.25 g  Status:  Discontinued         2.25 g 100 mL/hr over 30 Minutes Intravenous Every 6 hours 01/17/24 2120 01/18/24 1241   01/12/24 0200  piperacillin -tazobactam (ZOSYN ) IVPB 3.375 g  Status:  Discontinued        3.375 g 12.5 mL/hr over 240 Minutes Intravenous Every 8 hours 01/11/24 2120 01/17/24 2120   01/11/24 1815  piperacillin -tazobactam (ZOSYN ) IVPB 3.375 g        3.375 g 100 mL/hr over 30 Minutes Intravenous  Once 01/11/24 1806 01/11/24 1921       Best Practice/Protocols:  VTE Prophylaxis: Mechanical GI Prophylaxis: Proton Pump Inhibitor Continous Sedation  Consults: Treatment Team:  Ccs, Md, MD Chucky Craver, MD    Studies:    Events:  Subjective:    Overnight Issues:  Got 1u prbc for hgb 6.9 overnight; pressor requirement down started on CRRT yesterday Objective:  Vital signs for last 24 hours: Temp:  [98.1 F (36.7 C)-98.8 F (37.1 C)] 98.8 F (37.1 C) (04/21 0900) Pulse Rate:  [58-71] 69 (04/21 0900) Resp:  [11-16] 12 (04/21 0900) SpO2:  [96 %-100 %] 99 % (04/21 0900) Arterial Line BP: (105-147)/(40-57) 140/49 (04/21 0900) FiO2 (%):  [28 %-40 %] 28 % (04/21 0800) Weight:  [88 kg] 88 kg (04/21 0300)  Hemodynamic parameters for last 24 hours:    Intake/Output from previous day: 04/20 0701 - 04/21 0700 In: 1038 [I.V.:818; IV Piggyback:200.1] Out: 4600 [Urine:493; Emesis/NG output:1102; Drains:553; Stool:73]  Intake/Output this shift: Total I/O In: 118.2 [I.V.:68.2; IV Piggyback:50] Out: 485 [Urine:58; Emesis/NG output:225; Drains:55]  Vent settings for last 24 hours: Vent Mode: PRVC FiO2 (%):  [28 %-40 %] 28 % Set Rate:  [14 bmp] 14 bmp Vt Set:  [560 mL] 560 mL PEEP:  [5 cmH20] 5 cmH20 Plateau Pressure:  [16 cmH20-19 cmH20] 18 cmH20  Physical Exam:  Intubated Soft, some distension JP - a little bright red blood Soft tissue wound vac secure Ileostomy -edematous/viable; some liquid stool in bag B/l LE edema Arouses to voice  Results for orders placed or performed  during the hospital encounter of 01/11/24 (from the past 24 hours)  Hemoglobin and hematocrit, blood     Status: Abnormal   Collection Time: 01/19/24 10:18 AM  Result Value Ref Range   Hemoglobin 9.6 (L) 13.0 - 17.0 g/dL   HCT 19.1 (L) 47.8 - 29.5 %  Glucose, capillary     Status: None   Collection Time: 01/19/24 11:53 AM  Result Value Ref Range   Glucose-Capillary 97 70 - 99 mg/dL  Renal function  panel (daily at 1600)     Status: Abnormal   Collection Time: 01/19/24  3:50 PM  Result Value Ref Range   Sodium 138 135 - 145 mmol/L   Potassium 4.8 3.5 - 5.1 mmol/L   Chloride 99 98 - 111 mmol/L   CO2 28 22 - 32 mmol/L   Glucose, Bld 109 (H) 70 - 99 mg/dL   BUN 56 (H) 8 - 23 mg/dL   Creatinine, Ser 8.65 (H) 0.61 - 1.24 mg/dL   Calcium  6.6 (L) 8.9 - 10.3 mg/dL   Phosphorus 5.5 (H) 2.5 - 4.6 mg/dL   Albumin  1.9 (L) 3.5 - 5.0 g/dL   GFR, Estimated 20 (L) >60 mL/min   Anion gap 11 5 - 15  Glucose, capillary     Status: None   Collection Time: 01/19/24  4:11 PM  Result Value Ref Range   Glucose-Capillary 98 70 - 99 mg/dL  Glucose, capillary     Status: None   Collection Time: 01/19/24  7:44 PM  Result Value Ref Range   Glucose-Capillary 93 70 - 99 mg/dL  Glucose, capillary     Status: Abnormal   Collection Time: 01/19/24 11:04 PM  Result Value Ref Range   Glucose-Capillary 102 (H) 70 - 99 mg/dL  Glucose, capillary     Status: None   Collection Time: 01/20/24  3:36 AM  Result Value Ref Range   Glucose-Capillary 96 70 - 99 mg/dL  DIC Panel Daily     Status: Abnormal   Collection Time: 01/20/24  4:27 AM  Result Value Ref Range   Prothrombin  Time 13.8 11.4 - 15.2 seconds   INR 1.0 0.8 - 1.2   aPTT 33 24 - 36 seconds   Fibrinogen  344 210 - 475 mg/dL   D-Dimer, Quant 7.84 (H) 0.00 - 0.50 ug/mL-FEU   Platelets 103 (L) 150 - 400 K/uL   Smear Review NO SCHISTOCYTES SEEN   Renal function panel (daily at 0500)     Status: Abnormal   Collection Time: 01/20/24  4:27 AM  Result Value Ref  Range   Sodium 141 135 - 145 mmol/L   Potassium 4.5 3.5 - 5.1 mmol/L   Chloride 103 98 - 111 mmol/L   CO2 30 22 - 32 mmol/L   Glucose, Bld 92 70 - 99 mg/dL   BUN 48 (H) 8 - 23 mg/dL   Creatinine, Ser 6.96 (H) 0.61 - 1.24 mg/dL   Calcium  7.1 (L) 8.9 - 10.3 mg/dL   Phosphorus 4.2 2.5 - 4.6 mg/dL   Albumin  1.8 (L) 3.5 - 5.0 g/dL   GFR, Estimated 24 (L) >60 mL/min   Anion gap 8 5 - 15  Comprehensive metabolic panel     Status: Abnormal   Collection Time: 01/20/24  4:27 AM  Result Value Ref Range   Sodium 139 135 - 145 mmol/L   Potassium 4.4 3.5 - 5.1 mmol/L   Chloride 101 98 - 111 mmol/L   CO2 29 22 - 32 mmol/L   Glucose, Bld 96 70 - 99 mg/dL   BUN 49 (H) 8 - 23 mg/dL   Creatinine, Ser 2.95 (H) 0.61 - 1.24 mg/dL   Calcium  7.0 (L) 8.9 - 10.3 mg/dL   Total Protein 5.0 (L) 6.5 - 8.1 g/dL   Albumin  1.9 (L) 3.5 - 5.0 g/dL   AST 284 (H) 15 - 41 U/L   ALT 666 (H) 0 - 44 U/L   Alkaline Phosphatase 95 38 - 126 U/L   Total Bilirubin  2.6 (H) 0.0 - 1.2 mg/dL   GFR, Estimated 23 (L) >60 mL/min   Anion gap 9 5 - 15  Phosphorus     Status: None   Collection Time: 01/20/24  4:27 AM  Result Value Ref Range   Phosphorus 4.5 2.5 - 4.6 mg/dL  Triglycerides     Status: Abnormal   Collection Time: 01/20/24  4:27 AM  Result Value Ref Range   Triglycerides 230 (H) <150 mg/dL  CBC     Status: Abnormal   Collection Time: 01/20/24  4:27 AM  Result Value Ref Range   WBC 14.9 (H) 4.0 - 10.5 K/uL   RBC 3.29 (L) 4.22 - 5.81 MIL/uL   Hemoglobin 9.8 (L) 13.0 - 17.0 g/dL   HCT 16.1 (L) 09.6 - 04.5 %   MCV 89.1 80.0 - 100.0 fL   MCH 29.8 26.0 - 34.0 pg   MCHC 33.4 30.0 - 36.0 g/dL   RDW 40.9 (H) 81.1 - 91.4 %   Platelets 103 (L) 150 - 400 K/uL   nRBC 8.1 (H) 0.0 - 0.2 %  Glucose, capillary     Status: None   Collection Time: 01/20/24  4:34 AM  Result Value Ref Range   Glucose-Capillary 73 70 - 99 mg/dL      Latest Ref Rng & Units 01/20/2024    4:27 AM 01/19/2024   10:18 AM 01/19/2024    6:53 AM   CBC  WBC 4.0 - 10.5 K/uL 14.9     Hemoglobin 13.0 - 17.0 g/dL 9.8  9.6  9.9   Hematocrit 39.0 - 52.0 % 29.3  28.8  29.2   Platelets 150 - 400 K/uL 150 - 400 K/uL 103    103        Assessment & Plan: Present on Admission:  (Resolved) Severe sepsis (HCC)  Essential hypertension, benign  History of pulmonary embolism  Diverticulitis  AKI (acute kidney injury) (HCC)  Hyperlipidemia  Spontaneous perforation of colon (HCC)  Ascites  History of pulmonary embolus (PE)  Pulmonary hypertension (HCC)  Cor pulmonale (HCC)  Peripheral arterial disease (HCC)  LAPAROTOMY, EXPLORATORY RIGHT COLECTOMY (without anastomosis) PLACEMENT OF ABTHERA WOUND VAC DRAINAGE OF INTRA-ABDOMINAL ABSCESS  4/16 EW   RE-EXPLORATION ABDOMEN, PLACEMENT OF ABTHERA WOUND VAC, SMALL BOWEL RESECTION 01/16/2024 EW  Re-exploration abdomen, small bowel resection, creation of end ileostomy, abd closure, placement wound vac 4/19 EW   FEN - pt with high OG output, not ready for enteral feeds; start TPN   Septic shock - down to , levo,- per CCM ABL anemia - monitor and transfuse as needed(13 u prbc total, 1u cryo, 6u FFP, 1 plt) Coagulopathy - resolved AKI - low uop, Cr up, 23L +, started CRRT 4/19,  defer to CCM/renal ID - cont broad spectrum IV abx; have source control now; prob just needs total of 5 days at this point Acute hypoxemic resp failure - per CCM  H/o VTE/PE/DVT - scds for now; was on coumadin   Dispo: await return of bowel function, transfuse as needed; start TPN   LOS: 9 days     01/20/2024   Junie Olds, MD  [facility]

## 2024-01-20 NOTE — Consult Note (Addendum)
 WOC Nurse Consult Note: Surgical PA at the bedside for wound assessment during the first post-op Vac change.  Full thickness midline abd post-op wound is beefy red, scant amt pink drainage, 18X4X.5cm.  Pt was medicated for pain prior to the procedure and tolerated mod amt discomfort as evidenced by grimacing.  Applied one piece black foam to cont suction.  WOC will plan to change again on Thurs.   WOC Nurse ostomy consult note Ileostomy stoma is red and viable, 1 3/8 inches, slightly above skin level.  30cc brown liquid in the pouch, no stool or flatus.   Applied barrier ring and one piece flexible convex pouch.  5 sets of each supply ordered to the room for staff nurse's use.  Use supplies: barrier ring Timm Foot # [id number] and convex pouch Lawson # [id number]. Educational materials left at the bedside. Pt is critically ill in ICU on the ventilator and no family members are present.  WOC team will begin teaching sessions when he is stable and out of ICU.  Enrolled patient in [employer] DC program: Yes; today Thank-you,  Wiliam Harder MSN, RN, [place], [place], CNS [phone]

## 2024-01-20 NOTE — Progress Notes (Incomplete)
 Arterial line at this time has good waveform, draws and flushes blood, is labeled, insertion site WNL.

## 2024-01-20 NOTE — Plan of Care (Signed)
  Problem: Health Behavior/Discharge Planning: Goal: Ability to manage health-related needs will improve Outcome: Progressing   Problem: Clinical Measurements: Goal: Ability to maintain clinical measurements within normal limits will improve Outcome: Progressing Goal: Will remain free from infection Outcome: Progressing Goal: Diagnostic test results will improve Outcome: Progressing Goal: Respiratory complications will improve Outcome: Progressing Goal: Cardiovascular complication will be avoided Outcome: Progressing   Problem: Elimination: Goal: Will not experience complications related to bowel motility Outcome: Progressing Goal: Will not experience complications related to urinary retention Outcome: Progressing

## 2024-01-20 NOTE — Progress Notes (Signed)
 [facility] Liaison Note  01/20/2024  Becker Christopher 06-24-1961 [id number]  Location: RN Hospital Liaison screened the patient remotely at [facility].  Insurance: [employer] development [employer]   Glyn Gerads is a 63 y.o. male who is a Primary Care Patient of [facility]ba, [name] [facility] [place]. The patient was screened for readmission hospitalization with noted high risk score for unplanned readmission risk with 2 IP in 6 months.  The patient was assessed for potential Care Management service needs for post hospital transition for care coordination. Review of patient's electronic medical record reveals patient was admitted with spontaneous perforation of colon. Pt currently not medical stable for discharge.   Plan: [facility] Liaison will continue to follow progress and disposition to asess for post hospital community care coordination/management needs.  Referral request for community care coordination: pending disposition.   VBCI Care Management/Population Health does not replace or interfere with any arrangements made by the Inpatient Transition of Care team.   For questions contact:   Lilla Reichert, RN, BSN Hospital Liaison [facility]   [facility], Population Health Office Hours MTWF  8:00 am-6:00 pm Direct Dial: [phone] mobile [email or web address]

## 2024-01-20 NOTE — Plan of Care (Signed)
  Problem: Clinical Measurements: Goal: Respiratory complications will improve Outcome: Not Progressing Goal: Cardiovascular complication will be avoided Outcome: Not Progressing   Problem: Nutrition: Goal: Adequate nutrition will be maintained Outcome: Not Progressing   Problem: Coping: Goal: Level of anxiety will decrease Outcome: Not Progressing   Problem: Elimination: Goal: Will not experience complications related to urinary retention Outcome: Not Progressing   Problem: Activity: Goal: Ability to tolerate increased activity will improve Outcome: Not Progressing   Problem: Respiratory: Goal: Ability to maintain a clear airway and adequate ventilation will improve Outcome: Not Progressing

## 2024-01-20 NOTE — Progress Notes (Signed)
 [facility] NEPHROLOGY PROGRESS NOTE  Assessment/ Plan: Pt is a 63 y.o. yo male with past medical history of DVT, PE on Coumadin  admitted on 4/12 with rectal bleed and hypotension seen for AKI.  # AKI - mostly anuric, creat 1.0 early in this hospital stay. After onset of severe shock 4/16 pt had progressive AKI. UA negative. Renal US  shows no obstruction. AKI due to ATN primarily due to shock +/- IV contrast.  CRRT started 4/19. Cont CRRT.  On 4K bath.  # Volume overload: severe. Keep net neg 50-75 cc/hr as tolerated.  #S/P cardiac arrest: 4/17  #Shock/ hypotension: remains on 2 pressors    #Metabolic acidosis: resolved w/ CRRT.  IV bicarb gtt was dced.  # Acute diverticulitis: w/ perforated ascending colon, s/p ileostomy, R hemicolectomy and some small bowel resection. Per gen surg/ pmd.   #VDRF: per CCM  Subjective: Seen and examined in ICU.  Tolerating CRRT well.  Around 2000 cc UF in last 24 hours.  Urine output around 493 cc in 24 hours.  Discussed with ICU nurses and team. Objective Vital signs in last 24 hours: Vitals:   01/20/24 0610 01/20/24 0630 01/20/24 0700 01/20/24 0800  BP:      Pulse: 67 66 67   Resp: 14 14 14    Temp: 98.4 F (36.9 C) 98.8 F (37.1 C) 98.6 F (37 C)   TempSrc:      SpO2: 99% 99% 99% 100%  Weight:      Height:  5\' 9"  (1.753 m)     Weight change: -7.1 kg  Intake/Output Summary (Last 24 hours) at 01/20/2024 0827 Last data filed at 01/20/2024 0800 Gross per 24 hour  Intake 1020.12 ml  Output 4857 ml  Net -3836.88 ml       Labs: RENAL PANEL Recent Labs  Lab 01/15/24 0254 01/15/24 1554 01/16/24 0427 01/16/24 0911 01/17/24 0514 01/17/24 1702 01/18/24 0424 01/18/24 1548 01/19/24 0108 01/19/24 1550 01/20/24 0427  NA 139   < > 141   < > 139 137 137 136 136 138 141  139  K 3.5   < > 6.0*   < > 5.0 5.2* 5.4* 5.4* 5.1 4.8 4.5  4.4  CL 102   < > 105  --  97* 93* 92* 89* 95* 99 103  101  CO2 26   < > 18*  --  23 25  27 27 28 28 30  29   GLUCOSE 115*   < > 186*  --  104* 108* 108* 104* 108* 109* 92  96  BUN 24*   < > 35*  --  52* 61* 70* 79* 70* 56* 48*  49*  CREATININE 0.97   < > 2.78*  --  3.88* 4.72* 5.38* 5.81* 4.38* 3.36* 2.89*  3.03*  CALCIUM  8.4*   < > 6.9*  --  5.9* 5.8* 5.7* 5.7* 6.3* 6.6* 7.1*  7.0*  MG 2.4  --  2.4  --  2.0  --  2.0  --  2.2  --   --   PHOS  --    < > 8.3*  --  7.7*  --  9.2* 9.5* 6.4* 5.5* 4.2  4.5  ALBUMIN   --    < > 2.2*  --   --  2.2*  --  2.0* 2.0*  1.9* 1.9* 1.8*  1.9*   < > = values in this interval not displayed.    Liver Function Tests: Recent Labs  Lab 01/17/24 1702  01/18/24 1548 01/19/24 0108 01/19/24 1550 01/20/24 0427  AST 5,081*  --  1,044*  --  340*  ALT 2,239*  --  1,133*  --  666*  ALKPHOS 115  --  99  --  95  BILITOT 2.9*  --  2.8*  --  2.6*  PROT 4.0*  --  4.4*  --  5.0*  ALBUMIN  2.2*   < > 2.0*  1.9* 1.9* 1.8*  1.9*   < > = values in this interval not displayed.   No results for input(s): "LIPASE", "AMYLASE" in the last 168 hours. No results for input(s): "AMMONIA" in the last 168 hours. CBC: Recent Labs    07/19/23 0448 07/22/23 0441 12/03/23 1437 12/04/23 1531 12/05/23 0036 01/11/24 1458 01/11/24 1957 01/12/24 0056 01/12/24 1044 01/12/24 1552 01/17/24 1702 01/17/24 2222 01/18/24 1548 01/18/24 2222 01/19/24 0653 01/19/24 1018 01/20/24 0427  HGB 10.1*   < > 7.6 Repeated and verified X2.*   < >  --    < > 6.7*   < >  --    < > 8.3*   < > 9.1* 6.9* 9.9* 9.6* 9.8*  MCV 92.7   < > 75.5*   < >  --    < > 76.6*   < >  --    < > 85.0  --   --   --   --   --  89.1  VITAMINB12 222  --   --   --  100*  --   --   --  93*  --   --   --   --   --   --   --   --   FOLATE 7.5  --   --   --  8.0  --   --   --  9.3  --   --   --   --   --   --   --   --   FERRITIN 25  --  4.5*  --  3*  --  19*  --   --   --   --   --   --   --   --   --   --   TIBC 328  --  382.2  --  364  --  293  --   --   --   --   --   --   --   --   --   --   IRON   25*  --  15*  --  11*  --  11*  --   --   --   --   --   --   --   --   --   --   RETICCTPCT 1.4  --   --   --  0.7  --  0.8  --   --   --   --   --   --   --   --   --   --    < > = values in this interval not displayed.    Cardiac Enzymes: No results for input(s): "CKTOTAL", "CKMB", "CKMBINDEX", "TROPONINI" in the last 168 hours. CBG: Recent Labs  Lab 01/19/24 1611 01/19/24 1944 01/19/24 2304 01/20/24 0336 01/20/24 0434  GLUCAP 98 93 102* 96 73    Iron  Studies: No results for input(s): "IRON ", "TIBC", "TRANSFERRIN", "FERRITIN" in the last 72 hours. Studies/Results: US  RENAL Result Date: 01/19/2024 CLINICAL DATA:  Acute  kidney injury. EXAM: RENAL / URINARY TRACT ULTRASOUND COMPLETE COMPARISON:  None Available. FINDINGS: Right Kidney: Renal measurements: 10.6 cm x 4.9 cm x 5.2 cm = volume: 142.0 mL. Limited in visualization secondary to overlying bowel gas. Echogenicity within normal limits. No mass or hydronephrosis visualized. Left Kidney: Renal measurements: 10.4 cm x 6.6 cm x 5.6 cm = volume: 201.5 mL. Limited in visualization secondary to overlying bowel gas. Echogenicity within normal limits. No mass or hydronephrosis visualized. Bladder: The urinary bladder is not visualized peer Other: It should be noted that the study is markedly limited, as per the ultrasound technologist, secondary to overlying bowel gas and lack of patient cooperation. IMPRESSION: 1. Limited study, as described above. 2. Otherwise, unremarkable ultrasonographic evaluation of the kidneys. Electronically Signed   By: Virgle Grime M.D.   On: 01/19/2024 02:26    Medications: Infusions:  sodium chloride  Stopped (01/20/24 0746)   fentaNYL  infusion INTRAVENOUS 100 mcg/hr (01/20/24 0800)   heparin  10,000 units/ 20 mL infusion syringe 500 Units/hr (01/20/24 0800)   norepinephrine  (LEVOPHED ) Adult infusion 10 mcg/min (01/20/24 0800)   piperacillin -tazobactam 100 mL/hr at 01/20/24 0800   prismasol  BGK 4/2.5 1,500  mL/hr at 01/20/24 0822   prismasol  BGK 4/2.5 400 mL/hr at 01/20/24 0503   prismasol  BGK 4/2.5 400 mL/hr at 01/20/24 0503   propofol  (DIPRIVAN ) infusion Stopped (01/19/24 0709)   TPN ADULT (ION)     vasopressin  Stopped (01/20/24 0206)    Scheduled Medications:  Chlorhexidine  Gluconate Cloth  6 each Topical Daily   hydrocortisone  sod succinate (SOLU-CORTEF ) inj  50 mg Intravenous Daily   insulin  aspart  0-9 Units Subcutaneous Q6H   mouth rinse  15 mL Mouth Rinse Q2H    have reviewed scheduled and prn medications.  Physical Exam: General: Critically ill looking male, intubated, sedated Heart:RRR, s1s2 nl Lungs: Coarse breath sound bilateral. Abdomen:soft, drain in place. Extremities:No edema Dialysis Access: Groin HD catheter  Partick Musselman [place] Planas 01/20/2024,8:27 AM  LOS: 9 days

## 2024-01-20 NOTE — Progress Notes (Signed)
 NAME:  Alexander [name], MRN:  [id number], DOB:  [birth year], LOS: 9 ADMISSION DATE:  01/11/2024, CONSULTATION DATE:  01/15/24 REFERRING MD:  Dr Elvan Hamel, CHIEF COMPLAINT:  vent management   History of Present Illness:  Pt is encephelopathic; therefore, this HPI is obtained from chart review. Alexander [name] is a 63 y.o. male who has a PMH as outlined below including a limited to PE/DVT on Coumadin .  He was admitted to [facility] on 01/11/2024 with rectal bleeding and hypotension.  Symptoms had started 1 day earlier and had persisted.   CT in the ED was concerning for diverticulitis versus colitis versus neoplasm.  He was started on Zosyn  and his Coumadin  was held.    He was evaluated by gastroenterology who recommended antibiotic therapy and conservative management in the interim with possible EGD/colonoscopy at a later date (possibly 4 to 6 weeks after completion of 10 days of antibiotics alone as he did not become transfusion dependent).  Unfortunately after his admission, he required multiple transfusions for persistent anemia and bleeding.  He had repeat CT scan on 4/15 that demonstrated ongoing inflammation and a large contained perforation without abscess and pneumoperitoneum, increased gaseous distention of small and large bowel distention consistent with ileus.  He was evaluated by general surgery who initially opted for conservative management.  Unfortunately he had ongoing abdominal pain and unfortunately 16, decision was made to go to the OR for further evaluation management.   He was taken to the OR and had an exploratory laparotomy where he was found to have a perforated ascending colon with intra-abdominal abscess and ultimately underwent with right colectomy without anastomosis, placement of wound VAC, and drainage of intra-abdominal abscesses.  He was left open and plan was for return trip to the OR on Friday/18.   Return to the ICU on the ventilator, and PCCM was asked to assist with ventilator and  medical management.  Pertinent  Medical History  has Elevated uric acid in blood; Chronic GERD; Chronic foot pain, right; Chronic pain of right ankle; Acute pulmonary embolism (HCC); DVT (deep venous thrombosis) (HCC); Essential hypertension, benign; Gout; Pulmonary embolus (HCC); Hepatic steatosis; Spinal stenosis; Aortic atherosclerosis (HCC); Peripheral arterial disease (HCC); Sleep disturbance; Diverticulosis; High risk medication use; S/P surgical manipulation of ankle joint; Hyperlipidemia; Noncompliance; Impaired fasting blood sugar; Chronic diarrhea; Pulmonary hypertension (HCC); Cor pulmonale (HCC); Chronic thromboembolic disease (HCC); Thrombocytopenia (HCC); History of pulmonary embolism; Diverticulitis; Symptomatic anemia; AKI (acute kidney injury) (HCC); Lower GI bleed; Iron  deficiency anemia; Diverticulitis of colon; ABLA (acute blood loss anemia); Hematochezia; Acute blood loss anemia; Rectal bleeding; Generalized abdominal pain; and Colon perforation (HCC) on their problem list.   Significant Hospital Events: Including procedures, antibiotic start and stop dates in addition to other pertinent events   4/14 admit 4/16 to OR for ex lap, returned to ICU on vent 4/16 cardiorespiratory arrest, achieved ROSC.  Coagulopathy 4/17 blood products + multiple pressors  4/18 back to the OR 4/19 encephalopathic. Back to the OR this morning-s/p exploratory laparotomy, ileostomy creation, small bowel resection, wound VAC in place 4/21 weaning pressors    Interim History / Subjective:   Off vaso, on 9 NE   Cr down to 3.03 ALP 95, AST 340, ALT 666, Tbili 2.6 WBC 15 hgb 9.8 plt 103   Objective   Blood pressure (!) 135/53, pulse 66, temperature 99 F (37.2 C), resp. rate 17, height 5\' 9"  (1.753 m), weight 88 kg, SpO2 97%.    Vent Mode: PRVC FiO2 (%):  [28 %-40 %]  28 % Set Rate:  [14 bmp] 14 bmp Vt Set:  [560 mL] 560 mL PEEP:  [5 cmH20] 5 cmH20 Plateau Pressure:  [16 cmH20-19 cmH20] 18  cmH20   Intake/Output Summary (Last 24 hours) at 01/20/2024 1000 Last data filed at 01/20/2024 0900 Gross per 24 hour  Intake 997.92 ml  Output 4780 ml  Net -3782.08 ml   Filed Weights   01/18/24 1841 01/19/24 0500 01/20/24 0300  Weight: 95.1 kg 92.6 kg 88 kg    Examination: General: critically ill M intubated sedated  HENT: NCAT ETT secure  Lungs: CTAb.  Mechanically ventilated  Cardiovascular: rrr s1s2  Abdomen: JP, ostomy, wound vac.  Extremities: no acute joint deformity. Edematous extremities   Neuro: sedated . Opens eyes to stimulation does not follow commands  GU: scrotal edema   Resolved Hospital Problem list     Assessment & Plan:   In-hospital cardiac arrest  -4/16, brief. ROSC achieved by time EM team arrived  P -supportive care   Acute hypoxic resp failure  Hx tobacco use  P -VAP, pulm hygiene -wean as able -- 4/21 low Vt on PSV  -adding PRN duoneb  -sedation: precedex  + fent  -- wean as able   Shock -- improving hemorrhagic shock + septic shock  R colon perf  -s/p ex lap R colectomy intra-abd abscess drainage 4/16 -return OR 4/17, small bowel resection  -return OR 4/19 ileostomy creation  P -zosyn  -wean pressors for SBP 100  -likely dc stress dose steroids 4/21 afternoon vs 4/22 AM  -OGT LIMWS -TPN  -surgical path 4/19 pending   ABLA Thrombocytopenia P -follow CBC  -transfuse PRN  -will give him Ca 4/21 -- recent high transfusion burden   AKI  Scrotal edema  P -CRRT per nephro -continue foley   Elevated LFTs  -PRN LFTs   Left femoral head AVN necrosis - supportive care.    Best Practice (right click and "Reselect all SmartList Selections" daily)   Diet/type: NPO DVT prophylaxis SCD Pressure ulcer(s): N/A GI prophylaxis: PPI Lines: Central line Foley:  Yes, and it is still needed Code Status:  full code Last date of multidisciplinary goals of care discussion [pending]  Labs   CBC: Recent Labs  Lab 01/15/24 0254  01/15/24 0940 01/16/24 0427 01/16/24 0911 01/16/24 1246 01/16/24 1552 01/17/24 0514 01/17/24 0757 01/17/24 1702 01/17/24 2222 01/17/24 2223 01/18/24 0230 01/18/24 0347 01/18/24 0726 01/18/24 1054 01/18/24 1548 01/18/24 2222 01/19/24 0541 01/19/24 0653 01/19/24 1018 01/20/24 0427  WBC 18.6*   < > 19.7*  --  14.9*  --  19.8*  --  20.6*  --   --   --   --   --   --   --   --   --   --   --  14.9*  NEUTROABS 15.4*  --  16.6*  --  12.4*  --  16.7*  --  16.9*  --   --   --   --   --   --   --   --   --   --   --   --   HGB 8.4*   < > 4.0*   < > 8.1*   < > 7.3*   < > 8.3*   < >  --   --    < > 7.7*   < > 9.1* 6.9*  --  9.9* 9.6* 9.8*  HCT 26.7*   < > 12.7*   < > 26.4*   < >  20.3*   < > 23.8*   < >  --   --    < > 22.0*   < > 26.2* 21.0*  --  29.2* 28.8* 29.3*  MCV 80.7   < > 92.0  --  93.6  --  85.3  --  85.0  --   --   --   --   --   --   --   --   --   --   --  89.1  PLT 320   < > 134*  127*  --  80*  80*   < > 152   < > 159  --  158 148*  --  141*  --   --   --  114*  --   --  103*  103*   < > = values in this interval not displayed.    Basic Metabolic Panel: Recent Labs  Lab 01/15/24 0254 01/15/24 1554 01/16/24 0427 01/16/24 0911 01/17/24 0514 01/17/24 1702 01/18/24 0424 01/18/24 1548 01/19/24 0108 01/19/24 1550 01/20/24 0427  NA 139   < > 141   < > 139   < > 137 136 136 138 141  139  K 3.5   < > 6.0*   < > 5.0   < > 5.4* 5.4* 5.1 4.8 4.5  4.4  CL 102   < > 105  --  97*   < > 92* 89* 95* 99 103  101  CO2 26   < > 18*  --  23   < > 27 27 28 28 30  29   GLUCOSE 115*   < > 186*  --  104*   < > 108* 104* 108* 109* 92  96  BUN 24*   < > 35*  --  52*   < > 70* 79* 70* 56* 48*  49*  CREATININE 0.97   < > 2.78*  --  3.88*   < > 5.38* 5.81* 4.38* 3.36* 2.89*  3.03*  CALCIUM  8.4*   < > 6.9*  --  5.9*   < > 5.7* 5.7* 6.3* 6.6* 7.1*  7.0*  MG 2.4  --  2.4  --  2.0  --  2.0  --  2.2  --   --   PHOS  --    < > 8.3*  --  7.7*  --  9.2* 9.5* 6.4* 5.5* 4.2  4.5   < > =  values in this interval not displayed.   GFR: Estimated Creatinine Clearance: 29.5 mL/min (A) (by C-G formula based on SCr of 2.89 mg/dL (H)). Recent Labs  Lab 01/13/24 1234 01/13/24 1624 01/15/24 2042 01/15/24 2043 01/16/24 0427 01/16/24 1246 01/17/24 0514 01/17/24 1702 01/20/24 0427  WBC  --    < >  --    < > 19.7* 14.9* 19.8* 20.6* 14.9*  LATICACIDVEN 1.2  --  4.4*  --  >9.0*  --  7.0*  --   --    < > = values in this interval not displayed.    Liver Function Tests: Recent Labs  Lab 01/15/24 2043 01/16/24 0427 01/17/24 1702 01/18/24 1548 01/19/24 0108 01/19/24 1550 01/20/24 0427  AST 24 90* 5,081*  --  1,044*  --  340*  ALT 15 55* 2,239*  --  1,133*  --  666*  ALKPHOS 27* 20* 115  --  99  --  95  BILITOT 0.8 0.9 2.9*  --  2.8*  --  2.6*  PROT 5.2* 3.7* 4.0*  --  4.4*  --  5.0*  ALBUMIN  3.0* 2.2* 2.2* 2.0* 2.0*  1.9* 1.9* 1.8*  1.9*   No results for input(s): "LIPASE", "AMYLASE" in the last 168 hours. No results for input(s): "AMMONIA" in the last 168 hours.  ABG    Component Value Date/Time   PHART 7.58 (H) 01/19/2024 0822   PCO2ART 30 (L) 01/19/2024 0822   PO2ART 64 (L) 01/19/2024 0822   HCO3 28.1 (H) 01/19/2024 0822   TCO2 15 (L) 01/16/2024 0911   ACIDBASEDEF 9.2 (H) 01/16/2024 1528   O2SAT 94.9 01/19/2024 0822     Coagulation Profile: Recent Labs  Lab 01/17/24 2223 01/18/24 0230 01/18/24 0726 01/19/24 0541 01/20/24 0427  INR 1.7* 1.6* 1.4* 1.1 1.0    Cardiac Enzymes: No results for input(s): "CKTOTAL", "CKMB", "CKMBINDEX", "TROPONINI" in the last 168 hours.   HbA1C: Hgb A1c MFr Bld  Date/Time Value Ref Range Status  12/03/2023 02:37 PM 5.9 4.6 - 6.5 % Final    Comment:    Glycemic Control Guidelines for People with Diabetes:Non Diabetic:  <6%Goal of Therapy: <7%Additional Action Suggested:  >8%   01/22/2020 11:37 AM 5.2 4.8 - 5.6 % Final    Comment:             Prediabetes: 5.7 - 6.4          Diabetes: >6.4          Glycemic control  for adults with diabetes: <7.0     CBG: Recent Labs  Lab 01/19/24 1611 01/19/24 1944 01/19/24 2304 01/20/24 0336 01/20/24 0434  GLUCAP 98 93 102* 96 73    CRITICAL CARE Performed by: Delories Fetter   Total critical care time: 43 minutes  Critical care time was exclusive of separately billable procedures and treating other patients. Critical care was necessary to treat or prevent imminent or life-threatening deterioration.  Critical care was time spent personally by me on the following activities: development of treatment plan with patient and/or surrogate as well as nursing, discussions with consultants, evaluation of patient's response to treatment, examination of patient, obtaining history from patient or surrogate, ordering and performing treatments and interventions, ordering and review of laboratory studies, ordering and review of radiographic studies, pulse oximetry and re-evaluation of patient's condition.  Eston Hence MSN, AGACNP-BC [facility] Amion for pager  01/20/2024, 10:00 AM

## 2024-01-20 NOTE — Progress Notes (Signed)
 PHARMACY - TOTAL PARENTERAL NUTRITION CONSULT NOTE   Indication:  intolerance to enteral nutrition  Patient Measurements: Height: 5\' 9"  (175.3 cm) Weight: 88 kg (194 lb 0.1 oz) IBW/kg (Calculated) : 70.7 TPN AdjBW (KG): 80.7 Body mass index is 28.65 kg/m.  Assessment: 63 year old male intubated and sedated in the ICU. S/p multiple surgical procedures as detailed below. Patient with brief cardiac arrest 4/16 requiring 4 vasopressors and stress dose steroids. His vasopressor requirements have decreased. Decline in renal function with minimal UOP started on CRRT 4/19. Pharmacy to start TPN as patient is currently not a candidate for enteral feeding yet.  Glucose / Insulin : no hx DM, CBGs stable ~ 100. Goal <180 Electrolytes: on CRRT since 4/19 -All electrolytes WNL this AM, except CorrCa (8.8) just slightly low (has received aggressive Ca replacement) Renal: CRRT Hepatic: LFTs improving (shock liver). Albumin  low, Tbili elevated -TG 230 (down from 385). Propofol  ordered for sedation, but not currently infusing Intake / Output; MIVF:  -CRRT filtration per nephrology -Making some urine ~ 500 ml/24hr -NG output: 1100 mL GI Imaging: 4/15 CT a/p 1. Persistent heterogeneous wall thickening of the right colon with surrounding inflammation and suspected enlarging contained perforation along the medial aspect of the ascending colon. As before, this may be secondary to diverticulitis, colitis or neoplasm. No organized fluid collection or pneumoperitoneum. General surgical consultation recommended. 2. Interval increased gastric distension with diffuse small and large bowel distension, most consistent with an ileus.  GI Surgeries / Procedures:  4/19 Re-exploration abdomen, small bowel resection, creation of end ileostomy, abd closure and placement of wound vac 4/17 Re-exploration abdomen, small bowel resection 4/16 Right colectomy, drainage of abscess  Central access: CVC TPN start date:  4/21  Nutritional Goals: Concentrated TPN to attempt to reduce volume  RD Assessment: Estimated Needs Total Energy Estimated Needs: 2250-2450 Total Protein Estimated Needs: 130-160g Total Fluid Estimated Needs: >/= 2L  Current Nutrition: NPO  Plan: At 1800 this evening Initiate TPN at 40 mL/hr Concentrated with Clinisol  15% to reduce total volume Propofol  not currently infusing Electrolytes in TPN:  Na 50 mEq/L All other electrolytes removed as pt on CRRT, management per nephrology Cl:Ac 1:1 Add standard MVI and trace elements to TPN Initiate sSSI q6h Monitor TPN labs on Mon/Thurs. Electrolytes being monitored daily on CRRT.  Shireen Dory, PharmD, BCPS Clinical Pharmacist 01/20/2024 8:00 AM

## 2024-01-21 ENCOUNTER — Ambulatory Visit

## 2024-01-21 DIAGNOSIS — R571 Hypovolemic shock: Secondary | ICD-10-CM | POA: Diagnosis not present

## 2024-01-21 DIAGNOSIS — I469 Cardiac arrest, cause unspecified: Secondary | ICD-10-CM | POA: Diagnosis not present

## 2024-01-21 DIAGNOSIS — J9601 Acute respiratory failure with hypoxia: Secondary | ICD-10-CM | POA: Diagnosis not present

## 2024-01-21 DIAGNOSIS — K631 Perforation of intestine (nontraumatic): Secondary | ICD-10-CM | POA: Diagnosis not present

## 2024-01-21 LAB — HEPATIC FUNCTION PANEL
ALT: 415 U/L — ABNORMAL HIGH (ref 0–44)
AST: 146 U/L — ABNORMAL HIGH (ref 15–41)
Albumin: 1.7 g/dL — ABNORMAL LOW (ref 3.5–5.0)
Alkaline Phosphatase: 83 U/L (ref 38–126)
Bilirubin, Direct: 1.1 mg/dL — ABNORMAL HIGH (ref 0.0–0.2)
Indirect Bilirubin: 0.8 mg/dL (ref 0.3–0.9)
Total Bilirubin: 1.9 mg/dL — ABNORMAL HIGH (ref 0.0–1.2)
Total Protein: 5.1 g/dL — ABNORMAL LOW (ref 6.5–8.1)

## 2024-01-21 LAB — GLUCOSE, CAPILLARY
Glucose-Capillary: 110 mg/dL — ABNORMAL HIGH (ref 70–99)
Glucose-Capillary: 116 mg/dL — ABNORMAL HIGH (ref 70–99)
Glucose-Capillary: 118 mg/dL — ABNORMAL HIGH (ref 70–99)
Glucose-Capillary: 131 mg/dL — ABNORMAL HIGH (ref 70–99)

## 2024-01-21 LAB — MAGNESIUM: Magnesium: 2.7 mg/dL — ABNORMAL HIGH (ref 1.7–2.4)

## 2024-01-21 LAB — CULTURE, BLOOD (SINGLE): Special Requests: ADEQUATE

## 2024-01-21 LAB — RENAL FUNCTION PANEL
Albumin: 1.7 g/dL — ABNORMAL LOW (ref 3.5–5.0)
Albumin: 1.7 g/dL — ABNORMAL LOW (ref 3.5–5.0)
Anion gap: 8 (ref 5–15)
Anion gap: 5 (ref 5–15)
BUN: 40 mg/dL — ABNORMAL HIGH (ref 8–23)
BUN: 39 mg/dL — ABNORMAL HIGH (ref 8–23)
CO2: 26 mmol/L (ref 22–32)
CO2: 29 mmol/L (ref 22–32)
Calcium: 7.1 mg/dL — ABNORMAL LOW (ref 8.9–10.3)
Calcium: 7.1 mg/dL — ABNORMAL LOW (ref 8.9–10.3)
Chloride: 100 mmol/L (ref 98–111)
Chloride: 103 mmol/L (ref 98–111)
Creatinine, Ser: 2.4 mg/dL — ABNORMAL HIGH (ref 0.61–1.24)
Creatinine, Ser: 2.28 mg/dL — ABNORMAL HIGH (ref 0.61–1.24)
GFR, Estimated: 30 mL/min — ABNORMAL LOW (ref >60)
GFR, Estimated: 32 mL/min — ABNORMAL LOW (ref >60)
Glucose, Bld: 134 mg/dL — ABNORMAL HIGH (ref 70–99)
Glucose, Bld: 116 mg/dL — ABNORMAL HIGH (ref 70–99)
Phosphorus: 3.9 mg/dL (ref 2.5–4.6)
Phosphorus: 2.6 mg/dL (ref 2.5–4.6)
Potassium: 4.3 mmol/L (ref 3.5–5.1)
Potassium: 4.2 mmol/L (ref 3.5–5.1)
Sodium: 134 mmol/L — ABNORMAL LOW (ref 135–145)
Sodium: 137 mmol/L (ref 135–145)

## 2024-01-21 LAB — DIC (DISSEMINATED INTRAVASCULAR COAGULATION)PANEL
D-Dimer, Quant: 8.29 ug{FEU}/mL — ABNORMAL HIGH (ref 0.00–0.50)
Fibrinogen: 402 mg/dL (ref 210–475)
INR: 1 (ref 0.8–1.2)
Platelets: 93 10*3/uL — ABNORMAL LOW (ref 150–400)
Prothrombin Time: 13.5 s (ref 11.4–15.2)
Smear Review: NONE SEEN
aPTT: 34 s (ref 24–36)

## 2024-01-21 LAB — SURGICAL PATHOLOGY

## 2024-01-21 LAB — APTT: aPTT: 34 s (ref 24–36)

## 2024-01-21 MED ORDER — SODIUM CHLORIDE 4 MEQ/ML IV SOLN
INTRAVENOUS | Status: AC
  Filled 2024-01-21: qty 595.2

## 2024-01-21 MED ORDER — THIAMINE HCL 100 MG/ML IJ SOLN
100.0000 mg | Freq: Every day | INTRAMUSCULAR | Status: AC
  Administered 2024-01-21 – 2024-01-25 (×5): 100 mg via INTRAVENOUS
  Filled 2024-01-21 (×5): qty 2

## 2024-01-21 MED ORDER — PANTOPRAZOLE SODIUM 40 MG IV SOLR
40.0000 mg | Freq: Two times a day (BID) | INTRAVENOUS | Status: DC
  Administered 2024-01-21 – 2024-01-27 (×13): 40 mg via INTRAVENOUS
  Filled 2024-01-21 (×13): qty 10

## 2024-01-21 NOTE — Progress Notes (Signed)
 [facility] NEPHROLOGY PROGRESS NOTE  Assessment/ Plan: Pt is a 63 y.o. yo male with past medical history of DVT, PE on Coumadin  admitted on 4/12 with rectal bleed and hypotension seen for AKI.  # AKI - mostly anuric, creat 1.0 early in this hospital stay. After onset of severe shock 4/16 pt had progressive AKI. UA negative. Renal US  shows no obstruction. AKI due to ATN primarily due to shock +/- IV contrast.  CRRT started 4/19. Cont CRRT.  On 4K bath.  UF as tolerated.  May be able to wean CRRT after extubation.  # Volume overload: severe. Keep net neg 50-75 cc/hr as tolerated.  #S/P cardiac arrest: 4/17  #Shock/ hypotension: remains on 2 pressors    #Metabolic acidosis: resolved w/ CRRT.  IV bicarb gtt was dced.  # Acute diverticulitis: w/ perforated ascending colon, s/p ileostomy, R hemicolectomy and some small bowel resection. Per gen surg/ pmd.   #VDRF: per CCM  Subjective: Seen and examined in ICU.  Urine output is recorded.  330 cc.  Tolerating CRRT well.  No new event.  Objective Vital signs in last 24 hours: Vitals:   01/21/24 0500 01/21/24 0600 01/21/24 0800 01/21/24 0815  BP:      Pulse:  64 66   Resp:  14 13   Temp:  98.8 F (37.1 C)  99.2 F (37.3 C)  TempSrc:    Axillary  SpO2:  100% 100%   Weight: 85.9 kg     Height:       Weight change: -2.1 kg  Intake/Output Summary (Last 24 hours) at 01/21/2024 0858 Last data filed at 01/21/2024 0800 Gross per 24 hour  Intake 1577.68 ml  Output 2976 ml  Net -1398.32 ml       Labs: RENAL PANEL Recent Labs  Lab 01/16/24 0427 01/16/24 0911 01/17/24 0514 01/17/24 1702 01/18/24 0424 01/18/24 0823 01/19/24 0108 01/19/24 1550 01/20/24 0427 01/20/24 1524 01/21/24 0504  NA 141   < > 139   < > 137   < > 136 138 141  139 139 134*  K 6.0*   < > 5.0   < > 5.4*   < > 5.1 4.8 4.5  4.4 4.9 4.3  CL 105  --  97*   < > 92*   < > 95* 99 103  101 102 100  CO2 18*  --  23   < > 27   < > 28 28 30  29 28  26   GLUCOSE 186*  --  104*   < > 108*   < > 108* 109* 92  96 96 134*  BUN 35*  --  52*   < > 70*   < > 70* 56* 48*  49* 45* 40*  CREATININE 2.78*  --  3.88*   < > 5.38*   < > 4.38* 3.36* 2.89*  3.03* 2.64* 2.40*  CALCIUM  6.9*  --  5.9*   < > 5.7*   < > 6.3* 6.6* 7.1*  7.0* 7.2* 7.1*  MG 2.4  --  2.0  --  2.0  --  2.2  --   --   --  2.7*  PHOS 8.3*  --  7.7*  --  9.2*   < > 6.4* 5.5* 4.2  4.5 5.0* 3.9  ALBUMIN  2.2*  --   --    < >  --    < > 2.0*  1.9* 1.9* 1.8*  1.9* 1.8* 1.7*  1.7*   < > =  values in this interval not displayed.    Liver Function Tests: Recent Labs  Lab 01/19/24 0108 01/19/24 1550 01/20/24 0427 01/20/24 1524 01/21/24 0504  AST 1,044*  --  340*  --  146*  ALT 1,133*  --  666*  --  415*  ALKPHOS 99  --  95  --  83  BILITOT 2.8*  --  2.6*  --  1.9*  PROT 4.4*  --  5.0*  --  5.1*  ALBUMIN  2.0*  1.9*   < > 1.8*  1.9* 1.8* 1.7*  1.7*   < > = values in this interval not displayed.   No results for input(s): "LIPASE", "AMYLASE" in the last 168 hours. No results for input(s): "AMMONIA" in the last 168 hours. CBC: Recent Labs    07/19/23 0448 07/22/23 0441 12/03/23 1437 12/04/23 1531 12/05/23 0036 01/11/24 1458 01/11/24 1957 01/12/24 0056 01/12/24 1044 01/12/24 1552 01/17/24 1702 01/17/24 2222 01/18/24 1548 01/18/24 2222 01/19/24 0653 01/19/24 1018 01/20/24 0427  HGB 10.1*   < > 7.6 Repeated and verified X2.*   < >  --    < > 6.7*   < >  --    < > 8.3*   < > 9.1* 6.9* 9.9* 9.6* 9.8*  MCV 92.7   < > 75.5*   < >  --    < > 76.6*   < >  --    < > 85.0  --   --   --   --   --  89.1  VITAMINB12 222  --   --   --  100*  --   --   --  93*  --   --   --   --   --   --   --   --   FOLATE 7.5  --   --   --  8.0  --   --   --  9.3  --   --   --   --   --   --   --   --   FERRITIN 25  --  4.5*  --  3*  --  19*  --   --   --   --   --   --   --   --   --   --   TIBC 328  --  382.2  --  364  --  293  --   --   --   --   --   --   --   --   --   --   IRON  25*   --  15*  --  11*  --  11*  --   --   --   --   --   --   --   --   --   --   RETICCTPCT 1.4  --   --   --  0.7  --  0.8  --   --   --   --   --   --   --   --   --   --    < > = values in this interval not displayed.    Cardiac Enzymes: No results for input(s): "CKTOTAL", "CKMB", "CKMBINDEX", "TROPONINI" in the last 168 hours. CBG: Recent Labs  Lab 01/20/24 1234 01/20/24 1519 01/20/24 1714 01/20/24 2306 01/21/24 0626  GLUCAP 83 86 97 123* 116*    Iron  Studies: No results for  input(s): "IRON ", "TIBC", "TRANSFERRIN", "FERRITIN" in the last 72 hours. Studies/Results: No results found.   Medications: Infusions:  sodium chloride  10 mL/hr at 01/21/24 0800   dexmedetomidine  (PRECEDEX ) IV infusion 0.4 mcg/kg/hr (01/21/24 0800)   fentaNYL  infusion INTRAVENOUS 100 mcg/hr (01/21/24 0800)   heparin  10,000 units/ 20 mL infusion syringe 500 Units/hr (01/21/24 0800)   norepinephrine  (LEVOPHED ) Adult infusion 1 mcg/min (01/21/24 0800)   piperacillin -tazobactam Stopped (01/21/24 0851)   prismasol  BGK 4/2.5 1,500 mL/hr at 01/21/24 0742   prismasol  BGK 4/2.5 400 mL/hr at 01/21/24 0615   prismasol  BGK 4/2.5 400 mL/hr at 01/21/24 3664   TPN ADULT (ION) 40 mL/hr at 01/21/24 0800   TPN ADULT (ION)      Scheduled Medications:  Chlorhexidine  Gluconate Cloth  6 each Topical Daily   insulin  aspart  0-9 Units Subcutaneous Q6H   mouth rinse  15 mL Mouth Rinse Q2H   pantoprazole  (PROTONIX ) IV  40 mg Intravenous Q12H   thiamine  (VITAMIN B1) injection  100 mg Intravenous Daily    have reviewed scheduled and prn medications.  Physical Exam: General: Critically ill looking male, intubated, sedated Heart:RRR, s1s2 nl Lungs: Coarse breath sound bilateral. Abdomen:soft, drain in place. Extremities:No edema Dialysis Access: Groin HD catheter  Alexander [name] 01/21/2024,8:58 AM  LOS: 10 days

## 2024-01-21 NOTE — Progress Notes (Signed)
 Arterial line at this time has good waveform, draws and flushes blood, insertion site WNL, is labeled.

## 2024-01-21 NOTE — Progress Notes (Signed)
 NAME:  Alexander [name], MRN:  [id number], DOB:  October 26, 1960, LOS: 10 ADMISSION DATE:  01/11/2024, CONSULTATION DATE:  01/15/24 REFERRING MD:  Dr Elvan Hamel, CHIEF COMPLAINT:  vent management   History of Present Illness:  Pt is encephelopathic; therefore, this HPI is obtained from chart review. Alexander [name] is a 63 y.o. male who has a PMH as outlined below including a limited to PE/DVT on Coumadin .  He was admitted to [facility] on 01/11/2024 with rectal bleeding and hypotension.  Symptoms had started 1 day earlier and had persisted.   CT in the ED was concerning for diverticulitis versus colitis versus neoplasm.  He was started on Zosyn  and his Coumadin  was held.    He was evaluated by gastroenterology who recommended antibiotic therapy and conservative management in the interim with possible EGD/colonoscopy at a later date (possibly 4 to 6 weeks after completion of 10 days of antibiotics alone as he did not become transfusion dependent).  Unfortunately after his admission, he required multiple transfusions for persistent anemia and bleeding.  He had repeat CT scan on 4/15 that demonstrated ongoing inflammation and a large contained perforation without abscess and pneumoperitoneum, increased gaseous distention of small and large bowel distention consistent with ileus.  He was evaluated by general surgery who initially opted for conservative management.  Unfortunately he had ongoing abdominal pain and unfortunately 16, decision was made to go to the OR for further evaluation management.   He was taken to the OR and had an exploratory laparotomy where he was found to have a perforated ascending colon with intra-abdominal abscess and ultimately underwent with right colectomy without anastomosis, placement of wound VAC, and drainage of intra-abdominal abscesses.  He was left open and plan was for return trip to the OR on Friday/18.   Return to the ICU on the ventilator, and PCCM was asked to assist with ventilator and  medical management.  Pertinent  Medical History  has Elevated uric acid in blood; Chronic GERD; Chronic foot pain, right; Chronic pain of right ankle; Acute pulmonary embolism (HCC); DVT (deep venous thrombosis) (HCC); Essential hypertension, benign; Gout; Pulmonary embolus (HCC); Hepatic steatosis; Spinal stenosis; Aortic atherosclerosis (HCC); Peripheral arterial disease (HCC); Sleep disturbance; Diverticulosis; High risk medication use; S/P surgical manipulation of ankle joint; Hyperlipidemia; Noncompliance; Impaired fasting blood sugar; Chronic diarrhea; Pulmonary hypertension (HCC); Cor pulmonale (HCC); Chronic thromboembolic disease (HCC); Thrombocytopenia (HCC); History of pulmonary embolism; Diverticulitis; Symptomatic anemia; AKI (acute kidney injury) (HCC); Lower GI bleed; Iron  deficiency anemia; Diverticulitis of colon; ABLA (acute blood loss anemia); Hematochezia; Acute blood loss anemia; Rectal bleeding; Generalized abdominal pain; and Colon perforation (HCC) on their problem list.   Significant Hospital Events: Including procedures, antibiotic start and stop dates in addition to other pertinent events   4/14 admit 4/16 to OR for ex lap, returned to ICU on vent 4/16 cardiorespiratory arrest, achieved ROSC.  Coagulopathy 4/17 blood products + multiple pressors  4/18 back to the OR 4/19 encephalopathic. Back to the OR this morning-s/p exploratory laparotomy, ileostomy creation, small bowel resection, wound VAC in place 4/21 weaning pressors    Interim History / Subjective:   Improving LFTs Improving pressor req -- down to 2 NE   On 100 fent 0.4 precedex    Objective   Blood pressure (!) 135/53, pulse 66, temperature 99.2 F (37.3 C), temperature source Axillary, resp. rate 13, height 5\' 9"  (1.753 m), weight 85.9 kg, SpO2 100%.    Vent Mode: PRVC FiO2 (%):  [28 %] 28 % Set Rate:  [  14 bmp] 14 bmp Vt Set:  [560 mL] 560 mL PEEP:  [5 cmH20] 5 cmH20 Plateau Pressure:  [14 cmH20-18  cmH20] 18 cmH20   Intake/Output Summary (Last 24 hours) at 01/21/2024 0918 Last data filed at 01/21/2024 0900 Gross per 24 hour  Intake 1660.76 ml  Output 2988 ml  Net -1327.24 ml   Filed Weights   01/19/24 0500 01/20/24 0300 01/21/24 0500  Weight: 92.6 kg 88 kg 85.9 kg    Examination: General: critically ill M intubated lightly sedated  HENT: NCAT ETT secure.  Lungs: Mechanically ventilated, clear  Cardiovascular: rrr s1s2  Abdomen: JP, ostomy, wound vac  Extremities: no acute joint deformity. Edematous extremities   Neuro: sedated . Opens eyes to stimulation does not follow commands  GU: scrotal edema   Resolved Hospital Problem list     Assessment & Plan:   In-hospital cardiac arrest  -4/16, brief. ROSC achieved by time EM team arrived  P -supportive care   Acute hypoxic resp failure Hx tobacco use P -VAP, pulm hygiene -WUA/SBT -think will take a bit for sedation to clear   Shock, improving (hemorrhagic, septic)  R colon perf  -s/p ex lap R colectomy intra-abd abscess drainage 4/16 -return OR 4/17, small bowel resection  -return OR 4/19 ileostomy creation  P -zosyn  -wean pressors for SBP 100  -OGT LIMWS -TPN  -surgical path 4/19 pending   ABLA Thrombocytopenia P -follow CBC  -transfuse PRN  -dc SCH daily DIC panels.   AKI  Hyponatremia Hypocalcemia Hypermagnesemia Scrotal edema  P -CRRT per nephro -continue foley   Elevated LFTs, improving  -PRN LFTs   Left femoral head AVN necrosis - supportive care.    Best Practice (right click and "Reselect all SmartList Selections" daily)   Diet/type: NPO DVT prophylaxis SCD Pressure ulcer(s): N/A GI prophylaxis: PPI Lines: Central line Foley:  Yes, and it is still needed Code Status:  full code Last date of multidisciplinary goals of care discussion [pending]  Labs   CBC: Recent Labs  Lab 01/15/24 0254 01/15/24 0940 01/16/24 0427 01/16/24 0911 01/16/24 1246 01/16/24 1552  01/17/24 0514 01/17/24 0757 01/17/24 1702 01/17/24 2222 01/18/24 0230 01/18/24 0347 01/18/24 0726 01/18/24 0823 01/18/24 1548 01/18/24 2222 01/19/24 0541 01/19/24 0653 01/19/24 1018 01/20/24 0427 01/21/24 0504  WBC 18.6*   < > 19.7*  --  14.9*  --  19.8*  --  20.6*  --   --   --   --   --   --   --   --   --   --  14.9*  --   NEUTROABS 15.4*  --  16.6*  --  12.4*  --  16.7*  --  16.9*  --   --   --   --   --   --   --   --   --   --   --   --   HGB 8.4*   < > 4.0*   < > 8.1*   < > 7.3*   < > 8.3*   < >  --    < > 7.7*   < > 9.1* 6.9*  --  9.9* 9.6* 9.8*  --   HCT 26.7*   < > 12.7*   < > 26.4*   < > 20.3*   < > 23.8*   < >  --    < > 22.0*   < > 26.2* 21.0*  --  29.2* 28.8* 29.3*  --  MCV 80.7   < > 92.0  --  93.6  --  85.3  --  85.0  --   --   --   --   --   --   --   --   --   --  89.1  --   PLT 320   < > 134*  127*  --  80*  80*   < > 152   < > 159   < > 148*  --  141*  --   --   --  114*  --   --  103*  103* 93*   < > = values in this interval not displayed.    Basic Metabolic Panel: Recent Labs  Lab 01/16/24 0427 01/16/24 0911 01/17/24 0514 01/17/24 1702 01/18/24 0424 01/18/24 0823 01/19/24 0108 01/19/24 1550 01/20/24 0427 01/20/24 1524 01/21/24 0504  NA 141   < > 139   < > 137   < > 136 138 141  139 139 134*  K 6.0*   < > 5.0   < > 5.4*   < > 5.1 4.8 4.5  4.4 4.9 4.3  CL 105  --  97*   < > 92*   < > 95* 99 103  101 102 100  CO2 18*  --  23   < > 27   < > 28 28 30  29 28 26   GLUCOSE 186*  --  104*   < > 108*   < > 108* 109* 92  96 96 134*  BUN 35*  --  52*   < > 70*   < > 70* 56* 48*  49* 45* 40*  CREATININE 2.78*  --  3.88*   < > 5.38*   < > 4.38* 3.36* 2.89*  3.03* 2.64* 2.40*  CALCIUM  6.9*  --  5.9*   < > 5.7*   < > 6.3* 6.6* 7.1*  7.0* 7.2* 7.1*  MG 2.4  --  2.0  --  2.0  --  2.2  --   --   --  2.7*  PHOS 8.3*  --  7.7*  --  9.2*   < > 6.4* 5.5* 4.2  4.5 5.0* 3.9   < > = values in this interval not displayed.   GFR: Estimated Creatinine  Clearance: 35.1 mL/min (A) (by C-G formula based on SCr of 2.4 mg/dL (H)). Recent Labs  Lab 01/15/24 2042 01/15/24 2043 01/16/24 0427 01/16/24 1246 01/17/24 0514 01/17/24 1702 01/20/24 0427  WBC  --    < > 19.7* 14.9* 19.8* 20.6* 14.9*  LATICACIDVEN 4.4*  --  >9.0*  --  7.0*  --   --    < > = values in this interval not displayed.    Liver Function Tests: Recent Labs  Lab 01/16/24 0427 01/17/24 1702 01/18/24 1548 01/19/24 0108 01/19/24 1550 01/20/24 0427 01/20/24 1524 01/21/24 0504  AST 90* 5,081*  --  1,044*  --  340*  --  146*  ALT 55* 2,239*  --  1,133*  --  666*  --  415*  ALKPHOS 20* 115  --  99  --  95  --  83  BILITOT 0.9 2.9*  --  2.8*  --  2.6*  --  1.9*  PROT 3.7* 4.0*  --  4.4*  --  5.0*  --  5.1*  ALBUMIN  2.2* 2.2*   < > 2.0*  1.9* 1.9* 1.8*  1.9* 1.8* 1.7*  1.7*   < > =  values in this interval not displayed.   No results for input(s): "LIPASE", "AMYLASE" in the last 168 hours. No results for input(s): "AMMONIA" in the last 168 hours.  ABG    Component Value Date/Time   PHART 7.58 (H) 01/19/2024 0822   PCO2ART 30 (L) 01/19/2024 0822   PO2ART 64 (L) 01/19/2024 0822   HCO3 28.1 (H) 01/19/2024 0822   TCO2 31 01/18/2024 0927   ACIDBASEDEF 9.2 (H) 01/16/2024 1528   O2SAT 94.9 01/19/2024 0822     Coagulation Profile: Recent Labs  Lab 01/18/24 0230 01/18/24 0726 01/19/24 0541 01/20/24 0427 01/21/24 0504  INR 1.6* 1.4* 1.1 1.0 1.0    Cardiac Enzymes: No results for input(s): "CKTOTAL", "CKMB", "CKMBINDEX", "TROPONINI" in the last 168 hours.   HbA1C: Hgb A1c MFr Bld  Date/Time Value Ref Range Status  12/03/2023 02:37 PM 5.9 4.6 - 6.5 % Final    Comment:    Glycemic Control Guidelines for People with Diabetes:Non Diabetic:  <6%Goal of Therapy: <7%Additional Action Suggested:  >8%   01/22/2020 11:37 AM 5.2 4.8 - 5.6 % Final    Comment:             Prediabetes: 5.7 - 6.4          Diabetes: >6.4          Glycemic control for adults with  diabetes: <7.0     CBG: Recent Labs  Lab 01/20/24 1234 01/20/24 1519 01/20/24 1714 01/20/24 2306 01/21/24 0626  GLUCAP 83 86 97 123* 116*    CRITICAL CARE Performed by: Delories Fetter   Total critical care time: 39  minutes  Critical care time was exclusive of separately billable procedures and treating other patients. Critical care was necessary to treat or prevent imminent or life-threatening deterioration.  Critical care was time spent personally by me on the following activities: development of treatment plan with patient and/or surrogate as well as nursing, discussions with consultants, evaluation of patient's response to treatment, examination of patient, obtaining history from patient or surrogate, ordering and performing treatments and interventions, ordering and review of laboratory studies, ordering and review of radiographic studies, pulse oximetry and re-evaluation of patient's condition.  Eston Hence MSN, AGACNP-BC [facility] Amion for pager 01/21/2024, 9:18 AM

## 2024-01-21 NOTE — Progress Notes (Addendum)
 PHARMACY - TOTAL PARENTERAL NUTRITION CONSULT NOTE   Indication:  intolerance to enteral nutrition  Patient Measurements: Height: 5\' 9"  (175.3 cm) Weight: 85.9 kg (189 lb 6 oz) IBW/kg (Calculated) : 70.7 TPN AdjBW (KG): 80.7 Body mass index is 27.97 kg/m.  Assessment: 63 year old male intubated and sedated in the ICU. S/p multiple surgical procedures as detailed below. Patient with brief cardiac arrest 4/16 requiring 4 vasopressors and stress dose steroids. His vasopressor requirements have decreased. Decline in renal function with minimal UOP started on CRRT 4/19. Pharmacy to start TPN as patient is currently not a candidate for enteral feeding yet.  Glucose / Insulin : no hx DM, Goal CBG <180 -Range: 116 - 134 (1 unit SSI required) Electrolytes: on CRRT since 4/19 -Na (134) slightly low. Mg elevated. All other lytes WNL, including CorrCa (8.9). Pt has required aggressive Ca replacement Renal: CRRT Hepatic: LFTs and Tbili improving (shock liver). Albumin  low -TG 230 (down from 385). Propofol  discontinued Intake / Output; MIVF:  -CRRT filtration per nephrology -Making some urine ~ 320 ml/24hr -NG output: 1000 mL, Stool: 70 mL -Drain ouput: 423 mL GI Imaging: 4/15 CT a/p 1. Persistent heterogeneous wall thickening of the right colon with surrounding inflammation and suspected enlarging contained perforation along the medial aspect of the ascending colon. As before, this may be secondary to diverticulitis, colitis or neoplasm. No organized fluid collection or pneumoperitoneum. General surgical consultation recommended. 2. Interval increased gastric distension with diffuse small and large bowel distension, most consistent with an ileus.  GI Surgeries / Procedures:  4/19 Re-exploration abdomen, small bowel resection, creation of end ileostomy, abd closure and placement of wound vac 4/17 Re-exploration abdomen, small bowel resection 4/16 Right colectomy, drainage of abscess 4/19 ex lap,  end ileostomy excision, wound vac  Central access: CVC TPN start date: 4/21  Nutritional Goals: Concentrated TPN to attempt to reduce volume  RD Assessment: Estimated Needs Total Energy Estimated Needs: 2250-2450 Total Protein Estimated Needs: 130-160g Total Fluid Estimated Needs: >/= 2L  Current Nutrition: NPO  Plan:  Increase TPN to 60 mL/hr Concentrated with Clinisol  15% to reduce total volume Will provide 90g protein, 1510 kcal Electrolytes in TPN: Increase Na Na 100 mEq/L All other electrolytes removed as pt on CRRT, management per nephrology Cl:Ac 1:1 Add standard MVI and trace elements to TPN Thiamine  100 mg IV daily x 5 days Continue sSSI q6h Monitor TPN labs on Mon/Thurs. Electrolytes being monitored daily on CRRT.  Shireen Dory, PharmD, BCPS Clinical Pharmacist 01/21/2024 7:41 AM

## 2024-01-21 NOTE — Plan of Care (Signed)
  Problem: Clinical Measurements: Goal: Will remain free from infection Outcome: Progressing Goal: Cardiovascular complication will be avoided Outcome: Progressing   Problem: Pain Managment: Goal: General experience of comfort will improve and/or be controlled Outcome: Progressing

## 2024-01-21 NOTE — Progress Notes (Signed)
 Patient ID: Claus Silvestro, male   DOB: [birth year], 63 y.o.   MRN: [id number]   Follow up - Emergency General Surgery  Patient Details:    Amel Gianino is an 63 y.o. male.  Lines/tubes : Airway 7.5 mm (Active)  Secured at (cm) 23 cm 01/19/24 0329  Measured From Lips 01/19/24 0329  Secured Location Right 01/19/24 0329  Secured By [employer] 01/19/24 0329  Bite Block No 01/19/24 0329  Tube Holder Repositioned Yes 01/19/24 0329  Prone position No 01/19/24 0329  Head position Right 01/18/24 2000  Cuff Pressure (cm H2O) Clear OR 27-39 CmH2O 01/19/24 0329  Site Condition Dry 01/19/24 0329     CVC Double Lumen 01/15/24 Right Internal jugular 16 cm (Active)  Indication for Insertion or Continuance of Line Vasoactive infusions 01/19/24 0400  Site Assessment Clean, Dry, Intact 01/19/24 0400  Proximal Lumen Status Infusing 01/19/24 0400  Distal Lumen Status Infusing 01/19/24 0400  Dressing Type Transparent 01/19/24 0400  Dressing Status Antimicrobial disc/dressing in place;Clean;Dry 01/19/24 0400  Line Care Connections checked and tightened 01/19/24 0400  Dressing Intervention Dressing changed 01/19/24 0545  Dressing Change Due 01/26/24 01/19/24 0545     Arterial Line 01/15/24 Left Radial (Active)  Site Assessment Clean, Dry, Intact 01/19/24 0400  Line Status Pulsatile blood flow 01/19/24 0400  Art Line Waveform Appropriate 01/19/24 0400  Art Line Interventions Connections checked and tightened;Flushed per protocol;Line pulled back 01/19/24 0400  Color/Movement/Sensation Capillary refill less than 3 sec 01/19/24 0400  Dressing Type Transparent 01/19/24 0400  Dressing Status Clean, Dry, Intact;Antimicrobial disc in place 01/19/24 0400  Interventions Other (Comment) 01/19/24 0400  Dressing Change Due 01/22/24 01/19/24 0400     Closed System Drain LLQ Bulb (JP) 19 Fr. (Active)  Site Description Unable to view 01/18/24 2000  Dressing Status Clean, Dry, Intact 01/18/24 2000   Drainage Appearance Bright red;Serosanguineous 01/18/24 2000  Status To gravity (Uncharged) 01/18/24 2000  Output (mL) 20 mL 01/19/24 0800     Negative Pressure Wound Therapy Abdomen Medial;Upper (Active)  Site / Wound Assessment Dressing in place / Unable to assess 01/18/24 2000  Wound filler - Black foam 1 01/18/24 0900  Target Pressure (mmHg) 125 01/18/24 2000  Dressing Status Intact 01/18/24 2000  Drainage Amount Minimal 01/18/24 2000  Drainage Description Sanguineous 01/18/24 2000  Output (mL) 0 mL 01/19/24 0800     NG/OG Vented/Dual Lumen 18 Fr. Oral (Active)  Tube Position (Required) Marking at nare/corner of mouth 01/19/24 0745  Measurement (cm) (Required) 60 cm 01/18/24 2000  Ongoing Placement Verification (Required) (See row information) Yes 01/19/24 0745  Site Assessment Clean, Dry, Intact 01/19/24 0745  Interventions Other (Comment) 01/17/24 0515  Status Low intermittent suction 01/19/24 0745  Amount of suction 55 mmHg 01/18/24 2000  Drainage Appearance Bile;Brown 01/19/24 0745  Intake (mL) 60 mL 01/18/24 0020  Output (mL) 28 mL 01/19/24 0800     Ileostomy Standard (end) RUQ (Active)  Ostomy Pouch 1 piece 01/19/24 0745  Stoma Assessment Red 01/19/24 0745  Peristomal Assessment Intact 01/19/24 0745  Output (mL) 0 mL 01/19/24 0800     Urethral Catheter JAMIE BANKS RN Latex 14 Fr. (Active)  Indication for Insertion or Continuance of Catheter Therapy based on hourly urine output monitoring and documentation for critical condition (NOT STRICT I&O) 01/19/24 0745  Site Assessment Clean, Dry, Intact;Swelling 01/19/24 0745  Catheter Maintenance Bag below level of bladder;Catheter secured;Drainage bag/tubing not touching floor;Insertion date on drainage bag;No dependent loops;Seal intact 01/19/24 0745  Collection Container Standard  drainage bag 01/19/24 0745  Securement Method Adhesive securement device 01/19/24 0745  Urinary Catheter Interventions (if applicable) Unclamped  01/18/24 2000  Input (mL) 10 mL 01/16/24 2047  Output (mL) 11 mL 01/19/24 0800     Fecal Management System 30 mL (Active)  Does patient meet criteria for removal? No 01/19/24 0745  Daily care Skin around tube assessed;Assess location of position indicator line;Bag changed (every 24 hours) 01/19/24 0745  Patient Indicator Assessment Green 01/19/24 0745  Bulb Deflated and Reinflated Yes 01/19/24 0745  Amount in bulb 35 mL 01/19/24 0745  Output (mL) 0 mL 01/19/24 0800    Microbiology/Sepsis markers: Results for orders placed or performed during the hospital encounter of 01/11/24  Culture, blood (single)     Status: None   Collection Time: 01/11/24  7:57 PM   Specimen: BLOOD  Result Value Ref Range Status   Specimen Description   Final    BLOOD BLOOD RIGHT ARM Performed at [facility], [address], [place], [place] [address]    Special Requests   Final    BOTTLES DRAWN AEROBIC AND ANAEROBIC Blood Culture adequate volume Performed at [facility], [address], [place], [place] [address]    Culture  Setup Time   Final    GRAM POSITIVE RODS ANAEROBIC BOTTLE ONLY CRITICAL RESULT CALLED TO, READ BACK BY AND VERIFIED WITH: PHARMD E JACKSON 01/16/2024 @ 0350 BY AB    Culture   Final    EGGERTHELLA SPP Standardized susceptibility testing for this organism is not available. Performed at [facility], [address], [place], [place] [address]    Report Status 01/21/2024 FINAL  Final  MRSA Next Gen by PCR, Nasal     Status: Abnormal   Collection Time: 01/12/24 12:06 AM   Specimen: Nasal Mucosa; Nasal Swab  Result Value Ref Range Status   MRSA by PCR Next Gen DETECTED (A) NOT DETECTED Corrected    Comment: CRITICAL RESULT CALLED TO, READ BACK BY AND VERIFIED WITH: Godwin Lat, RN [phone] 01/12/24 MH (NOTE) The GeneXpert MRSA Assay (FDA approved for NASAL specimens only), is one component of a comprehensive MRSA colonization  surveillance program. It is not intended to diagnose MRSA infection nor to guide or monitor treatment for MRSA infections. Test performance is not FDA approved in patients less than 37 years old. Performed at [facility], [address], [place], [place] [address] CORRECTED ON 04/13 AT 0255: PREVIOUSLY REPORTED AS DETECTED CRITICAL RESULT CALLED TO, READ BACK BY AND VERIFIED WITH: Godwin Lat, RN [phone] 01/12/24 MH   Resp panel by RT-PCR (RSV, Flu A&B, Covid) Anterior Nasal Swab     Status: None   Collection Time: 01/12/24  9:53 AM   Specimen: Anterior Nasal Swab  Result Value Ref Range Status   SARS Coronavirus 2 by RT PCR NEGATIVE NEGATIVE Final    Comment: (NOTE) SARS-CoV-2 target nucleic acids are NOT DETECTED.  The SARS-CoV-2 RNA is generally detectable in upper respiratory specimens during the acute phase of infection. The lowest concentration of SARS-CoV-2 viral copies this assay can detect is 138 copies/mL. A negative result does not preclude SARS-Cov-2 infection and should not be used as the sole basis for treatment or other patient management decisions. A negative result may occur with  improper specimen collection/handling, submission of specimen other than nasopharyngeal swab, presence of viral mutation(s) within the areas targeted by this assay, and inadequate number of viral copies(<138 copies/mL). A negative result must  be combined with clinical observations, patient history, and epidemiological information. The expected result is Negative.  Fact Sheet for Patients:  BloggerCourse.com  Fact Sheet for Healthcare Providers:  SeriousBroker.it  This test is no t yet approved or cleared by the [place]  FDA and  has been authorized for detection and/or diagnosis of SARS-CoV-2 by FDA under an Emergency Use Authorization (EUA). This EUA will remain  in effect (meaning this test can be used) for the  duration of the COVID-19 declaration under Section 564(b)(1) of the Act, 21 U.S.C.section 360bbb-3(b)(1), unless the authorization is terminated  or revoked sooner.       Influenza A by PCR NEGATIVE NEGATIVE Final   Influenza B by PCR NEGATIVE NEGATIVE Final    Comment: (NOTE) The Xpert Xpress SARS-CoV-2/FLU/RSV plus assay is intended as an aid in the diagnosis of influenza from Nasopharyngeal swab specimens and should not be used as a sole basis for treatment. Nasal washings and aspirates are unacceptable for Xpert Xpress SARS-CoV-2/FLU/RSV testing.  Fact Sheet for Patients: BloggerCourse.com  Fact Sheet for Healthcare Providers: SeriousBroker.it  This test is not yet approved or cleared by the [place]  FDA and has been authorized for detection and/or diagnosis of SARS-CoV-2 by FDA under an Emergency Use Authorization (EUA). This EUA will remain in effect (meaning this test can be used) for the duration of the COVID-19 declaration under Section 564(b)(1) of the Act, 21 U.S.C. section 360bbb-3(b)(1), unless the authorization is terminated or revoked.     Resp Syncytial Virus by PCR NEGATIVE NEGATIVE Final    Comment: (NOTE) Fact Sheet for Patients: BloggerCourse.com  Fact Sheet for Healthcare Providers: SeriousBroker.it  This test is not yet approved or cleared by the [place]  FDA and has been authorized for detection and/or diagnosis of SARS-CoV-2 by FDA under an Emergency Use Authorization (EUA). This EUA will remain in effect (meaning this test can be used) for the duration of the COVID-19 declaration under Section 564(b)(1) of the Act, 21 U.S.C. section 360bbb-3(b)(1), unless the authorization is terminated or revoked.  Performed at [facility], [address] [place], [place] [address]   Culture, blood (Routine X 2) w Reflex to ID  Panel     Status: None   Collection Time: 01/13/24  7:16 AM   Specimen: BLOOD LEFT HAND  Result Value Ref Range Status   Specimen Description   Final    BLOOD LEFT HAND Performed at [facility], [address], [place], [place] [address]    Special Requests   Final    BOTTLES DRAWN AEROBIC AND ANAEROBIC Blood Culture results may not be optimal due to an inadequate volume of blood received in culture bottles Performed at [facility], [address], [place], [place] [address]    Culture   Final    NO GROWTH 5 DAYS Performed at [facility], [address], [place], [place] [address]    Report Status 01/18/2024 FINAL  Final  Culture, blood (Routine X 2) w Reflex to ID Panel     Status: None   Collection Time: 01/13/24  7:16 AM   Specimen: BLOOD RIGHT ARM  Result Value Ref Range Status   Specimen Description   Final    BLOOD RIGHT ARM Performed at [facility], [address], [place], [place] [address]    Special Requests   Final    BOTTLES DRAWN AEROBIC AND ANAEROBIC Blood Culture results may not be optimal due to  an inadequate volume of blood received in culture bottles Performed at [facility], [address], [place], [place] [address]    Culture   Final    NO GROWTH 5 DAYS Performed at [facility], [address] [place], [place] [address]    Report Status 01/18/2024 FINAL  Final    Anti-infectives:  Anti-infectives (From admission, onward)    Start     Dose/Rate Route Frequency Ordered Stop   01/18/24 1400  piperacillin -tazobactam (ZOSYN ) IVPB 3.375 g  Status:  Discontinued        3.375 g 12.5 mL/hr over 240 Minutes Intravenous Every 6 hours 01/18/24 1241 01/18/24 1242   01/18/24 1400  piperacillin -tazobactam (ZOSYN ) IVPB 3.375 g        3.375 g 100 mL/hr over 30 Minutes Intravenous Every 6 hours 01/18/24 1242 01/23/24 2359   01/18/24 0000  piperacillin -tazobactam (ZOSYN ) IVPB 2.25 g  Status:   Discontinued        2.25 g 100 mL/hr over 30 Minutes Intravenous Every 6 hours 01/17/24 2120 01/18/24 1241   01/12/24 0200  piperacillin -tazobactam (ZOSYN ) IVPB 3.375 g  Status:  Discontinued        3.375 g 12.5 mL/hr over 240 Minutes Intravenous Every 8 hours 01/11/24 2120 01/17/24 2120   01/11/24 1815  piperacillin -tazobactam (ZOSYN ) IVPB 3.375 g        3.375 g 100 mL/hr over 30 Minutes Intravenous  Once 01/11/24 1806 01/11/24 1921       Best Practice/Protocols:  VTE Prophylaxis: Mechanical GI Prophylaxis: Proton Pump Inhibitor Continous Sedation  Consults: Treatment Team:  Ccs, Md, MD Chucky Craver, MD    Studies:    Events:  Subjective:    Overnight Issues:  Got 1u prbc for hgb 6.9 overnight; pressor requirement down started on CRRT yesterday Objective:  Vital signs for last 24 hours: Temp:  [98.2 F (36.8 C)-99.5 F (37.5 C)] 99.2 F (37.3 C) (04/22 0815) Pulse Rate:  [62-74] 66 (04/22 0900) Resp:  [12-20] 14 (04/22 0900) SpO2:  [100 %] 100 % (04/22 0900) Arterial Line BP: (105-143)/(40-55) 105/40 (04/22 0900) FiO2 (%):  [28 %] 28 % (04/22 0721) Weight:  [85.9 kg] 85.9 kg (04/22 0500)  Hemodynamic parameters for last 24 hours:    Intake/Output from previous day: 04/21 0701 - 04/22 0700 In: 1610.2 [I.V.:1360.2; IV Piggyback:250] Out: 3190 [Urine:333; Emesis/NG output:1000; Drains:423; Stool:70]  Intake/Output this shift: Total I/O In: 253.7 [I.V.:174.8; Other:30; IV Piggyback:49] Out: 522 [Urine:14; Emesis/NG output:140; Drains:45]  Vent settings for last 24 hours: Vent Mode: PRVC FiO2 (%):  [28 %] 28 % Set Rate:  [14 bmp] 14 bmp Vt Set:  [560 mL] 560 mL PEEP:  [5 cmH20] 5 cmH20 Plateau Pressure:  [14 cmH20-18 cmH20] 18 cmH20  Physical Exam:  Intubated Soft, some distension JP - a little bright red blood Soft tissue wound vac secure Ileostomy -edematous/viable; some liquid stool in bag B/l LE edema Arouses to voice  Results for orders  placed or performed during the hospital encounter of 01/11/24 (from the past 24 hours)  Glucose, capillary     Status: None   Collection Time: 01/20/24 12:34 PM  Result Value Ref Range   Glucose-Capillary 83 70 - 99 mg/dL   Comment 1 Notify RN    Comment 2 Document in Chart   Glucose, capillary     Status: None   Collection Time: 01/20/24  3:19 PM  Result Value Ref Range   Glucose-Capillary 86 70 - 99 mg/dL  Renal function panel (daily at 1600)     Status: Abnormal   Collection Time: 01/20/24  3:24 PM  Result Value Ref Range   Sodium 139 135 - 145 mmol/L   Potassium 4.9 3.5 - 5.1 mmol/L   Chloride 102 98 - 111 mmol/L   CO2 28 22 - 32 mmol/L   Glucose, Bld 96 70 - 99 mg/dL   BUN 45 (H) 8 - 23 mg/dL   Creatinine, Ser 8.29 (H) 0.61 - 1.24 mg/dL   Calcium  7.2 (L) 8.9 - 10.3 mg/dL   Phosphorus 5.0 (H) 2.5 - 4.6 mg/dL   Albumin  1.8 (L) 3.5 - 5.0 g/dL   GFR, Estimated 27 (L) >60 mL/min   Anion gap 9 5 - 15  Glucose, capillary     Status: None   Collection Time: 01/20/24  5:14 PM  Result Value Ref Range   Glucose-Capillary 97 70 - 99 mg/dL  Glucose, capillary     Status: Abnormal   Collection Time: 01/20/24 11:06 PM  Result Value Ref Range   Glucose-Capillary 123 (H) 70 - 99 mg/dL  DIC Panel Daily     Status: Abnormal   Collection Time: 01/21/24  5:04 AM  Result Value Ref Range   Prothrombin  Time 13.5 11.4 - 15.2 seconds   INR 1.0 0.8 - 1.2   aPTT 34 24 - 36 seconds   Fibrinogen  402 210 - 475 mg/dL   D-Dimer, Quant 5.62 (H) 0.00 - 0.50 ug/mL-FEU   Platelets 93 (L) 150 - 400 K/uL   Smear Review NO SCHISTOCYTES SEEN   Renal function panel (daily at 0500)     Status: Abnormal   Collection Time: 01/21/24  5:04 AM  Result Value Ref Range   Sodium 134 (L) 135 - 145 mmol/L   Potassium 4.3 3.5 - 5.1 mmol/L   Chloride 100 98 - 111 mmol/L   CO2 26 22 - 32 mmol/L   Glucose, Bld 134 (H) 70 - 99 mg/dL   BUN 40 (H) 8 - 23 mg/dL   Creatinine, Ser 1.30 (H) 0.61 - 1.24 mg/dL   Calcium   7.1 (L) 8.9 - 10.3 mg/dL   Phosphorus 3.9 2.5 - 4.6 mg/dL   Albumin  1.7 (L) 3.5 - 5.0 g/dL   GFR, Estimated 30 (L) >60 mL/min   Anion gap 8 5 - 15  Magnesium     Status: Abnormal   Collection Time: 01/21/24  5:04 AM  Result Value Ref Range   Magnesium 2.7 (H) 1.7 - 2.4 mg/dL  APTT     Status: None   Collection Time: 01/21/24  5:04 AM  Result Value Ref Range   aPTT 34 24 - 36 seconds  Hepatic function panel     Status: Abnormal   Collection Time: 01/21/24  5:04 AM  Result Value Ref Range   Total Protein 5.1 (L) 6.5 - 8.1 g/dL   Albumin  1.7 (L) 3.5 - 5.0 g/dL   AST 865 (H) 15 - 41 U/L   ALT 415 (H) 0 - 44 U/L   Alkaline Phosphatase 83 38 - 126 U/L   Total Bilirubin 1.9 (H) 0.0 - 1.2 mg/dL   Bilirubin, Direct 1.1 (H) 0.0 - 0.2 mg/dL   Indirect Bilirubin 0.8 0.3 - 0.9 mg/dL  Glucose, capillary     Status: Abnormal   Collection Time: 01/21/24  6:26 AM  Result Value Ref Range   Glucose-Capillary 116 (H) 70 - 99 mg/dL      Latest Ref Rng & Units 01/21/2024  5:04 AM 01/20/2024    4:27 AM 01/19/2024   10:18 AM  CBC  WBC 4.0 - 10.5 K/uL  14.9    Hemoglobin 13.0 - 17.0 g/dL  9.8  9.6   Hematocrit 39.0 - 52.0 %  29.3  28.8   Platelets 150 - 400 K/uL 93  103    103       Assessment & Plan: Present on Admission:  (Resolved) Severe sepsis (HCC)  Essential hypertension, benign  History of pulmonary embolism  Diverticulitis  AKI (acute kidney injury) (HCC)  Hyperlipidemia  Spontaneous perforation of colon (HCC)  Ascites  History of pulmonary embolus (PE)  Pulmonary hypertension (HCC)  Cor pulmonale (HCC)  Peripheral arterial disease (HCC)  LAPAROTOMY, EXPLORATORY RIGHT COLECTOMY (without anastomosis) PLACEMENT OF ABTHERA WOUND VAC DRAINAGE OF INTRA-ABDOMINAL ABSCESS  4/16 EW   RE-EXPLORATION ABDOMEN, PLACEMENT OF ABTHERA WOUND VAC, SMALL BOWEL RESECTION 01/16/2024 EW  Re-exploration abdomen, small bowel resection, creation of end ileostomy, abd closure, placement wound  vac 4/19 EW   FEN - pt with high OG output, not ready for enteral feeds; start TPN   Septic shock - off pressors - per CCM ABL anemia - monitor and transfuse as needed(13 u prbc total, 1u cryo, 6u FFP, 1 plt) Coagulopathy - resolved AKI - started CRRT 4/19,  defer to CCM/renal ID - cont broad spectrum IV abx; have source control now; prob just needs total of 5 days at this point Acute hypoxemic resp failure - per CCM  H/o VTE/PE/DVT - scds for now; was on coumadin   Dispo: await return of bowel function, transfuse as needed; start TPN   LOS: 10 days     01/21/2024   Junie Olds, MD  [facility] - [facility]

## 2024-01-22 ENCOUNTER — Inpatient Hospital Stay (HOSPITAL_COMMUNITY)

## 2024-01-22 DIAGNOSIS — K631 Perforation of intestine (nontraumatic): Secondary | ICD-10-CM | POA: Diagnosis not present

## 2024-01-22 DIAGNOSIS — I469 Cardiac arrest, cause unspecified: Secondary | ICD-10-CM | POA: Diagnosis not present

## 2024-01-22 DIAGNOSIS — D62 Acute posthemorrhagic anemia: Secondary | ICD-10-CM | POA: Diagnosis not present

## 2024-01-22 DIAGNOSIS — J9601 Acute respiratory failure with hypoxia: Secondary | ICD-10-CM | POA: Diagnosis not present

## 2024-01-22 LAB — RENAL FUNCTION PANEL
Albumin: 1.7 g/dL — ABNORMAL LOW (ref 3.5–5.0)
Albumin: 1.6 g/dL — ABNORMAL LOW (ref 3.5–5.0)
Anion gap: 9 (ref 5–15)
Anion gap: 8 (ref 5–15)
BUN: 36 mg/dL — ABNORMAL HIGH (ref 8–23)
BUN: 33 mg/dL — ABNORMAL HIGH (ref 8–23)
CO2: 26 mmol/L (ref 22–32)
CO2: 26 mmol/L (ref 22–32)
Calcium: 7.3 mg/dL — ABNORMAL LOW (ref 8.9–10.3)
Calcium: 7.2 mg/dL — ABNORMAL LOW (ref 8.9–10.3)
Chloride: 103 mmol/L (ref 98–111)
Chloride: 104 mmol/L (ref 98–111)
Creatinine, Ser: 2.13 mg/dL — ABNORMAL HIGH (ref 0.61–1.24)
Creatinine, Ser: 2.12 mg/dL — ABNORMAL HIGH (ref 0.61–1.24)
GFR, Estimated: 35 mL/min — ABNORMAL LOW (ref >60)
GFR, Estimated: 35 mL/min — ABNORMAL LOW (ref >60)
Glucose, Bld: 137 mg/dL — ABNORMAL HIGH (ref 70–99)
Glucose, Bld: 136 mg/dL — ABNORMAL HIGH (ref 70–99)
Phosphorus: 2.2 mg/dL — ABNORMAL LOW (ref 2.5–4.6)
Phosphorus: 3.2 mg/dL (ref 2.5–4.6)
Potassium: 4.1 mmol/L (ref 3.5–5.1)
Potassium: 3.8 mmol/L (ref 3.5–5.1)
Sodium: 138 mmol/L (ref 135–145)
Sodium: 138 mmol/L (ref 135–145)

## 2024-01-22 LAB — HEPATIC FUNCTION PANEL
ALT: 265 U/L — ABNORMAL HIGH (ref 0–44)
AST: 85 U/L — ABNORMAL HIGH (ref 15–41)
Albumin: 1.7 g/dL — ABNORMAL LOW (ref 3.5–5.0)
Alkaline Phosphatase: 78 U/L (ref 38–126)
Bilirubin, Direct: 1.2 mg/dL — ABNORMAL HIGH (ref 0.0–0.2)
Indirect Bilirubin: 1 mg/dL — ABNORMAL HIGH (ref 0.3–0.9)
Total Bilirubin: 2.2 mg/dL — ABNORMAL HIGH (ref 0.0–1.2)
Total Protein: 5.1 g/dL — ABNORMAL LOW (ref 6.5–8.1)

## 2024-01-22 LAB — CBC
HCT: 30.5 % — ABNORMAL LOW (ref 39.0–52.0)
Hemoglobin: 9.3 g/dL — ABNORMAL LOW (ref 13.0–17.0)
MCH: 29.2 pg (ref 26.0–34.0)
MCHC: 30.5 g/dL (ref 30.0–36.0)
MCV: 95.9 fL (ref 80.0–100.0)
Platelets: 139 10*3/uL — ABNORMAL LOW (ref 150–400)
RBC: 3.18 MIL/uL — ABNORMAL LOW (ref 4.22–5.81)
RDW: 18.6 % — ABNORMAL HIGH (ref 11.5–15.5)
WBC: 15.8 10*3/uL — ABNORMAL HIGH (ref 4.0–10.5)
nRBC: 0.3 % — ABNORMAL HIGH (ref 0.0–0.2)

## 2024-01-22 LAB — GLUCOSE, CAPILLARY
Glucose-Capillary: 129 mg/dL — ABNORMAL HIGH (ref 70–99)
Glucose-Capillary: 130 mg/dL — ABNORMAL HIGH (ref 70–99)
Glucose-Capillary: 132 mg/dL — ABNORMAL HIGH (ref 70–99)
Glucose-Capillary: 132 mg/dL — ABNORMAL HIGH (ref 70–99)

## 2024-01-22 LAB — MAGNESIUM: Magnesium: 2.5 mg/dL — ABNORMAL HIGH (ref 1.7–2.4)

## 2024-01-22 LAB — APTT: aPTT: 33 s (ref 24–36)

## 2024-01-22 MED ORDER — ORAL CARE MOUTH RINSE: 15.0000 mL | OROMUCOSAL | Status: DC | PRN

## 2024-01-22 MED ORDER — SODIUM PHOSPHATES 45 MMOLE/15ML IV SOLN
30.0000 mmol | Freq: Once | INTRAVENOUS | Status: AC
  Administered 2024-01-22: 30 mmol via INTRAVENOUS
  Filled 2024-01-22: qty 10

## 2024-01-22 MED ORDER — TRACE MINERALS CU-MN-SE-ZN 300-55-60-3000 MCG/ML IV SOLN
INTRAVENOUS | Status: AC
  Filled 2024-01-22: qty 744

## 2024-01-22 MED ORDER — IPRATROPIUM-ALBUTEROL 0.5-2.5 (3) MG/3ML IN SOLN: 3.0000 mL | RESPIRATORY_TRACT | Status: DC | PRN

## 2024-01-22 MED ORDER — ORAL CARE MOUTH RINSE
15.0000 mL | OROMUCOSAL | Status: DC
  Administered 2024-01-22: 15 mL via OROMUCOSAL

## 2024-01-22 MED ORDER — ORAL CARE MOUTH RINSE
15.0000 mL | OROMUCOSAL | Status: DC
  Administered 2024-01-23 – 2024-02-08 (×23): 15 mL via OROMUCOSAL

## 2024-01-22 NOTE — Progress Notes (Addendum)
 Alexander [name]  Assessment/ Plan: Pt is a 63 y.o. yo male with past medical history of DVT, PE on Coumadin  admitted on 4/12 with rectal bleed and hypotension seen for AKI.  # AKI - mostly anuric, creat 1.0 early in this hospital stay. After onset of severe shock 4/16 pt had progressive AKI. UA negative. Renal US  shows no obstruction. AKI due to ATN primarily due to shock +/- IV contrast.  CRRT started 4/19. Cont CRRT.  On 4K bath.  UF as tolerated.  No sign of renal recovery.  Off of pressors now.  Would like to see extubation before discontinuing CRRT.  # Volume overload: severe. Keep net neg 50-75 cc/hr as tolerated.  # Hypophosphatemia due to CRRT: Replete IV phosphorus, discussed with pharmacy.  #S/P cardiac arrest: 4/17  #Shock/ hypotension: Off of pressors now.  #Metabolic acidosis: resolved w/ CRRT.  IV bicarb gtt was dced.  # Acute diverticulitis: w/ perforated ascending colon, s/p ileostomy, R hemicolectomy and some small bowel resection. Per gen surg/ pmd.   #VDRF: per CCM  Subjective: Seen and examined in ICU.  Urine output is recorded only 161 cc.  Not on pressors today.  Intubated and sedated.  Discussed with ICU team. Objective Vital signs in last 24 hours: Vitals:   01/22/24 0300 01/22/24 0338 01/22/24 0435 01/22/24 0445  BP: (!) 154/57     Pulse:    78  Resp:    15  Temp:  97.7 F (36.5 C)    TempSrc:  Axillary    SpO2:    99%  Weight:   83.5 kg   Height:       Weight change: -2.4 kg  Intake/Output Summary (Last 24 hours) at 01/22/2024 0809 Last data filed at 01/22/2024 0700 Gross per 24 hour  Intake 2025.89 ml  Output 3502 ml  Net -1476.11 ml       Labs: RENAL PANEL Recent Labs  Lab 01/17/24 0514 01/17/24 1702 01/18/24 0424 01/18/24 0823 01/19/24 0108 01/19/24 1550 01/20/24 0427 01/20/24 1524 01/21/24 0504 01/21/24 1729 01/22/24 0417  NA 139   < > 137   < > 136   < > 141  139 139 134* 137 138  K  5.0   < > 5.4*   < > 5.1   < > 4.5  4.4 4.9 4.3 4.2 4.1  CL 97*   < > 92*   < > 95*   < > 103  101 102 100 103 103  CO2 23   < > 27   < > 28   < > 30  29 28 26 29 26   GLUCOSE 104*   < > 108*   < > 108*   < > 92  96 96 134* 116* 137*  BUN 52*   < > 70*   < > 70*   < > 48*  49* 45* 40* 39* 36*  CREATININE 3.88*   < > 5.38*   < > 4.38*   < > 2.89*  3.03* 2.64* 2.40* 2.28* 2.13*  CALCIUM  5.9*   < > 5.7*   < > 6.3*   < > 7.1*  7.0* 7.2* 7.1* 7.1* 7.3*  MG 2.0  --  2.0  --  2.2  --   --   --  2.7*  --  2.5*  PHOS 7.7*  --  9.2*   < > 6.4*   < > 4.2  4.5 5.0* 3.9 2.6 2.2*  ALBUMIN   --    < >  --    < >  2.0*  1.9*   < > 1.8*  1.9* 1.8* 1.7*  1.7* 1.7* 1.7*  1.7*   < > = values in this interval not displayed.    Liver Function Tests: Recent Labs  Lab 01/20/24 0427 01/20/24 1524 01/21/24 0504 01/21/24 1729 01/22/24 0417  AST 340*  --  146*  --  85*  ALT 666*  --  415*  --  265*  ALKPHOS 95  --  83  --  78  BILITOT 2.6*  --  1.9*  --  2.2*  PROT 5.0*  --  5.1*  --  5.1*  ALBUMIN  1.8*  1.9*   < > 1.7*  1.7* 1.7* 1.7*  1.7*   < > = values in this interval not displayed.   No results for input(s): "LIPASE", "AMYLASE" in the last 168 hours. No results for input(s): "AMMONIA" in the last 168 hours. CBC: Recent Labs    07/19/23 0448 07/22/23 0441 12/03/23 1437 12/04/23 1531 12/05/23 0036 01/11/24 1458 01/11/24 1957 01/12/24 0056 01/12/24 1044 01/12/24 1552 01/17/24 1702 01/17/24 2222 01/18/24 1548 01/18/24 2222 01/19/24 0653 01/19/24 1018 01/20/24 0427  HGB 10.1*   < > 7.6 Repeated and verified X2.*   < >  --    < > 6.7*   < >  --    < > 8.3*   < > 9.1* 6.9* 9.9* 9.6* 9.8*  MCV 92.7   < > 75.5*   < >  --    < > 76.6*   < >  --    < > 85.0  --   --   --   --   --  89.1  VITAMINB12 222  --   --   --  100*  --   --   --  93*  --   --   --   --   --   --   --   --   FOLATE 7.5  --   --   --  8.0  --   --   --  9.3  --   --   --   --   --   --   --   --   FERRITIN 25   --  4.5*  --  3*  --  19*  --   --   --   --   --   --   --   --   --   --   TIBC 328  --  382.2  --  364  --  293  --   --   --   --   --   --   --   --   --   --   IRON  25*  --  15*  --  11*  --  11*  --   --   --   --   --   --   --   --   --   --   RETICCTPCT 1.4  --   --   --  0.7  --  0.8  --   --   --   --   --   --   --   --   --   --    < > = values in this interval not displayed.    Cardiac Enzymes: No results for input(s): "CKTOTAL", "CKMB", "CKMBINDEX", "TROPONINI" in the last 168 hours. CBG: Recent Labs  Lab 01/21/24 [phone]  01/21/24 1120 01/21/24 1755 01/21/24 2340 01/22/24 0623  GLUCAP 116* 110* 118* 131* 132*    Iron  Studies: No results for input(s): "IRON ", "TIBC", "TRANSFERRIN", "FERRITIN" in the last 72 hours. Studies/Results: No results found.   Medications: Infusions:  sodium chloride  10 mL/hr at 01/22/24 0700   dexmedetomidine  (PRECEDEX ) IV infusion 0.3 mcg/kg/hr (01/22/24 0700)   fentaNYL  infusion INTRAVENOUS 75 mcg/hr (01/22/24 0700)   heparin  10,000 units/ 20 mL infusion syringe 500 Units/hr (01/22/24 0100)   norepinephrine  (LEVOPHED ) Adult infusion Stopped (01/21/24 0801)   piperacillin -tazobactam 3.375 g (01/22/24 0751)   prismasol  BGK 4/2.5 1,500 mL/hr at 01/22/24 0753   prismasol  BGK 4/2.5 400 mL/hr at 01/21/24 1931   prismasol  BGK 4/2.5 400 mL/hr at 01/21/24 1930   sodium PHOSPHATE  IVPB (in mmol)     TPN ADULT (ION) 60 mL/hr at 01/22/24 0700    Scheduled Medications:  Chlorhexidine  Gluconate Cloth  6 each Topical Daily   insulin  aspart  0-9 Units Subcutaneous Q6H   mouth rinse  15 mL Mouth Rinse Q2H   pantoprazole  (PROTONIX ) IV  40 mg Intravenous Q12H   thiamine  (VITAMIN B1) injection  100 mg Intravenous Daily    have reviewed scheduled and prn medications.  Physical Exam: General: Critically ill looking male, intubated, sedated Heart:RRR, s1s2 nl Lungs: Coarse breath sound bilateral. Abdomen:soft, drain in place. Extremities:No  edema Dialysis Access: Groin HD catheter  Alexander [name] 01/22/2024,8:09 AM  LOS: 11 days

## 2024-01-22 NOTE — Progress Notes (Signed)
 NAME:  Alexander [name], MRN:  [id number], DOB:  Nov 30, 1960, LOS: 11 ADMISSION DATE:  01/11/2024, CONSULTATION DATE:  01/15/24 REFERRING MD:  Dr Elvan Hamel, CHIEF COMPLAINT:  vent management   History of Present Illness:  Pt is encephelopathic; therefore, this HPI is obtained from chart review. Alexander [name] is a 63 y.o. male who has a PMH as outlined below including a limited to PE/DVT on Coumadin .  He was admitted to [facility] on 01/11/2024 with rectal bleeding and hypotension.  Symptoms had started 1 day earlier and had persisted.   CT in the ED was concerning for diverticulitis versus colitis versus neoplasm.  He was started on Zosyn  and his Coumadin  was held.    He was evaluated by gastroenterology who recommended antibiotic therapy and conservative management in the interim with possible EGD/colonoscopy at a later date (possibly 4 to 6 weeks after completion of 10 days of antibiotics alone as he did not become transfusion dependent).  Unfortunately after his admission, he required multiple transfusions for persistent anemia and bleeding.  He had repeat CT scan on 4/15 that demonstrated ongoing inflammation and a large contained perforation without abscess and pneumoperitoneum, increased gaseous distention of small and large bowel distention consistent with ileus.  He was evaluated by general surgery who initially opted for conservative management.  Unfortunately he had ongoing abdominal pain and unfortunately 16, decision was made to go to the OR for further evaluation management.   He was taken to the OR and had an exploratory laparotomy where he was found to have a perforated ascending colon with intra-abdominal abscess and ultimately underwent with right colectomy without anastomosis, placement of wound VAC, and drainage of intra-abdominal abscesses.  He was left open and plan was for return trip to the OR on Friday/18.   Return to the ICU on the ventilator, and PCCM was asked to assist with ventilator and  medical management.  Pertinent  Medical History  has Elevated uric acid in blood; Chronic GERD; Chronic foot pain, right; Chronic pain of right ankle; Acute pulmonary embolism (HCC); DVT (deep venous thrombosis) (HCC); Essential hypertension, benign; Gout; Pulmonary embolus (HCC); Hepatic steatosis; Spinal stenosis; Aortic atherosclerosis (HCC); Peripheral arterial disease (HCC); Sleep disturbance; Diverticulosis; High risk medication use; S/P surgical manipulation of ankle joint; Hyperlipidemia; Noncompliance; Impaired fasting blood sugar; Chronic diarrhea; Pulmonary hypertension (HCC); Cor pulmonale (HCC); Chronic thromboembolic disease (HCC); Thrombocytopenia (HCC); History of pulmonary embolism; Diverticulitis; Symptomatic anemia; AKI (acute kidney injury) (HCC); Lower GI bleed; Iron  deficiency anemia; Diverticulitis of colon; ABLA (acute blood loss anemia); Hematochezia; Acute blood loss anemia; Rectal bleeding; Generalized abdominal pain; and Colon perforation (HCC) on their problem list.   Significant Hospital Events: Including procedures, antibiotic start and stop dates in addition to other pertinent events   4/14 admit 4/16 to OR for ex lap, returned to ICU on vent 4/16 cardiorespiratory arrest, achieved ROSC.  Coagulopathy 4/17 blood products + multiple pressors  4/18 back to the OR 4/19 encephalopathic. Back to the OR this morning-s/p exploratory laparotomy, ileostomy creation, small bowel resection, wound VAC in place 4/21 weaning pressors    Interim History / Subjective:   On SBT doing well this morning On fentanyl  and precedex ; able to follow some commands Remains on CRRT  Objective   Blood pressure (!) 154/57, pulse 78, temperature 97.7 F (36.5 C), temperature source Axillary, resp. rate 15, height 5\' 9"  (1.753 m), weight 83.5 kg, SpO2 99%.    Vent Mode: PRVC FiO2 (%):  [28 %] 28 % Set Rate:  [  14 bmp] 14 bmp Vt Set:  [560 mL] 560 mL PEEP:  [5 cmH20] 5 cmH20 Pressure  Support:  [10 cmH20] 10 cmH20 Plateau Pressure:  [16 cmH20-28 cmH20] 16 cmH20   Intake/Output Summary (Last 24 hours) at 01/22/2024 0706 Last data filed at 01/22/2024 0700 Gross per 24 hour  Intake 2095.52 ml  Output 3680 ml  Net -1584.48 ml   Filed Weights   01/20/24 0300 01/21/24 0500 01/22/24 0435  Weight: 88 kg 85.9 kg 83.5 kg    Examination:  General:  critically ill appearing on mech vent HEENT: MM pink/moist; ETT in place Neuro: on fentanyl  and precedex ; able to follow some commands; perrl CV: s1s2, RRR, no m/r/g PULM:  dim clear BS bilaterally; on mech vent PRVC GI: ostomy, jp, and wound vac in place Extremities: warm/dry, trace ble edema  Skin: no rashes or lesions   Resolved Hospital Problem list     Assessment & Plan:   In-hospital cardiac arrest  -4/16, brief. ROSC achieved by time EM team arrived  P -supportive care   Acute hypoxic resp failure Hx tobacco use P: -sbt today and likely extubation if passes -wean sedation off -VAP bundle in place -pulm toiletry  Shock, improving (hemorrhagic, septic)  R colon perf  -s/p ex lap R colectomy intra-abd abscess drainage 4/16 -return OR 4/17, small bowel resection  -return OR 4/19 ileostomy creation  P: -SBP goal >100 -CCS following; appreciate recs -cont zosyn  -cont tpn -OGT LIMWS  ABLA Thrombocytopenia P: -trend cbc  AKI  Hyponatremia Hypocalcemia Hypermagnesemia Scrotal edema  P: -nephro following -cont CRRT -Trend BMP / urinary output -Replace electrolytes as indicated -Avoid nephrotoxic agents, ensure adequate renal perfusion  Elevated LFTs, improving  P: -PRN LFTs   Left femoral head AVN necrosis P: - supportive care.    Best Practice (right click and "Reselect all SmartList Selections" daily)   Diet/type: NPO DVT prophylaxis SCD Pressure ulcer(s): N/A GI prophylaxis: PPI Lines: Central line Foley:  Yes, and it is still needed Code Status:  full code Last date of  multidisciplinary goals of care discussion [pending]  Labs   CBC: Recent Labs  Lab 01/16/24 0427 01/16/24 0911 01/16/24 1246 01/16/24 1552 01/17/24 0514 01/17/24 0757 01/17/24 1702 01/17/24 2222 01/18/24 0230 01/18/24 0347 01/18/24 0726 01/18/24 0823 01/18/24 1548 01/18/24 2222 01/19/24 0541 01/19/24 0653 01/19/24 1018 01/20/24 0427 01/21/24 0504  WBC 19.7*  --  14.9*  --  19.8*  --  20.6*  --   --   --   --   --   --   --   --   --   --  14.9*  --   NEUTROABS 16.6*  --  12.4*  --  16.7*  --  16.9*  --   --   --   --   --   --   --   --   --   --   --   --   HGB 4.0*   < > 8.1*   < > 7.3*   < > 8.3*   < >  --    < > 7.7*   < > 9.1* 6.9*  --  9.9* 9.6* 9.8*  --   HCT 12.7*   < > 26.4*   < > 20.3*   < > 23.8*   < >  --    < > 22.0*   < > 26.2* 21.0*  --  29.2* 28.8* 29.3*  --   MCV 92.0  --  93.6  --  85.3  --  85.0  --   --   --   --   --   --   --   --   --   --  89.1  --   PLT 134*  127*  --  80*  80*   < > 152   < > 159   < > 148*  --  141*  --   --   --  114*  --   --  103*  103* 93*   < > = values in this interval not displayed.    Basic Metabolic Panel: Recent Labs  Lab 01/17/24 0514 01/17/24 1702 01/18/24 0424 01/18/24 0823 01/19/24 0108 01/19/24 1550 01/20/24 0427 01/20/24 1524 01/21/24 0504 01/21/24 1729 01/22/24 0417  NA 139   < > 137   < > 136   < > 141  139 139 134* 137 138  K 5.0   < > 5.4*   < > 5.1   < > 4.5  4.4 4.9 4.3 4.2 4.1  CL 97*   < > 92*   < > 95*   < > 103  101 102 100 103 103  CO2 23   < > 27   < > 28   < > 30  29 28 26 29 26   GLUCOSE 104*   < > 108*   < > 108*   < > 92  96 96 134* 116* 137*  BUN 52*   < > 70*   < > 70*   < > 48*  49* 45* 40* 39* 36*  CREATININE 3.88*   < > 5.38*   < > 4.38*   < > 2.89*  3.03* 2.64* 2.40* 2.28* 2.13*  CALCIUM  5.9*   < > 5.7*   < > 6.3*   < > 7.1*  7.0* 7.2* 7.1* 7.1* 7.3*  MG 2.0  --  2.0  --  2.2  --   --   --  2.7*  --  2.5*  PHOS 7.7*  --  9.2*   < > 6.4*   < > 4.2  4.5 5.0* 3.9 2.6 2.2*    < > = values in this interval not displayed.   GFR: Estimated Creatinine Clearance: 36.4 mL/min (A) (by C-G formula based on SCr of 2.13 mg/dL (H)). Recent Labs  Lab 01/15/24 2042 01/15/24 2043 01/16/24 0427 01/16/24 1246 01/17/24 0514 01/17/24 1702 01/20/24 0427  WBC  --    < > 19.7* 14.9* 19.8* 20.6* 14.9*  LATICACIDVEN 4.4*  --  >9.0*  --  7.0*  --   --    < > = values in this interval not displayed.    Liver Function Tests: Recent Labs  Lab 01/17/24 1702 01/18/24 1548 01/19/24 0108 01/19/24 1550 01/20/24 0427 01/20/24 1524 01/21/24 0504 01/21/24 1729 01/22/24 0417  AST 5,081*  --  1,044*  --  340*  --  146*  --  85*  ALT 2,239*  --  1,133*  --  666*  --  415*  --  265*  ALKPHOS 115  --  99  --  95  --  83  --  78  BILITOT 2.9*  --  2.8*  --  2.6*  --  1.9*  --  2.2*  PROT 4.0*  --  4.4*  --  5.0*  --  5.1*  --  5.1*  ALBUMIN  2.2*   < > 2.0*  1.9*   < >  1.8*  1.9* 1.8* 1.7*  1.7* 1.7* 1.7*  1.7*   < > = values in this interval not displayed.   No results for input(s): "LIPASE", "AMYLASE" in the last 168 hours. No results for input(s): "AMMONIA" in the last 168 hours.  ABG    Component Value Date/Time   PHART 7.58 (H) 01/19/2024 0822   PCO2ART 30 (L) 01/19/2024 0822   PO2ART 64 (L) 01/19/2024 0822   HCO3 28.1 (H) 01/19/2024 0822   TCO2 31 01/18/2024 0927   ACIDBASEDEF 9.2 (H) 01/16/2024 1528   O2SAT 94.9 01/19/2024 0822     Coagulation Profile: Recent Labs  Lab 01/18/24 0230 01/18/24 0726 01/19/24 0541 01/20/24 0427 01/21/24 0504  INR 1.6* 1.4* 1.1 1.0 1.0    Cardiac Enzymes: No results for input(s): "CKTOTAL", "CKMB", "CKMBINDEX", "TROPONINI" in the last 168 hours.   HbA1C: Hgb A1c MFr Bld  Date/Time Value Ref Range Status  12/03/2023 02:37 PM 5.9 4.6 - 6.5 % Final    Comment:    Glycemic Control Guidelines for People with Diabetes:Non Diabetic:  <6%Goal of Therapy: <7%Additional Action Suggested:  >8%   01/22/2020 11:37 AM 5.2 4.8 -  5.6 % Final    Comment:             Prediabetes: 5.7 - 6.4          Diabetes: >6.4          Glycemic control for adults with diabetes: <7.0     CBG: Recent Labs  Lab 01/21/24 0626 01/21/24 1120 01/21/24 1755 01/21/24 2340 01/22/24 0623  GLUCAP 116* 110* 118* 131* 132*    CRITICAL CARE Performed by: Casimiro Cleaves   Total critical care time: 35  minutes  JD Vira Grieves [facility] 01/22/2024, 8:19 AM  Please see [email or web address] for pager details.  From 7A-7P if no response, please call [phone]. After hours, please call ELink [phone].

## 2024-01-22 NOTE — Progress Notes (Signed)
 PHARMACY - TOTAL PARENTERAL NUTRITION CONSULT NOTE   Indication:  intolerance to enteral nutrition  Patient Measurements: Height: 5\' 9"  (175.3 cm) Weight: 83.5 kg (184 lb 1.4 oz) IBW/kg (Calculated) : 70.7 TPN AdjBW (KG): 80.7 Body mass index is 27.18 kg/m.  Assessment: 63 year old male intubated and sedated in the ICU. S/p multiple surgical procedures as detailed below. Patient with brief cardiac arrest 4/16 requiring 4 vasopressors and stress dose steroids. His vasopressor requirements have decreased. Decline in renal function with minimal UOP started on CRRT 4/19. Pharmacy to start TPN as patient is currently not a candidate for enteral feeding yet.  Glucose / Insulin : no hx DM, Goal CBG <180 -Range: 110 - 137 (2 units SSI required) Electrolytes: on CRRT since 4/19 -Phos (2.2) low, Mg remains elevated -All other lytes WNL, including CorrCa (9.1). Pt has required aggressive Ca replacement during admission Renal: CRRT Hepatic: LFTs improving (shock liver). Albumin  low, Tbili elevated -TG 230 on 4/21 Intake / Output; MIVF:  -CRRT filtration per nephrology -Mostly anuric: UOP ~160 mL/24hr -NG output: 650 mL, Stool: None -Drain output: 264 mL GI Imaging: 4/15 CT a/p 1. Persistent heterogeneous wall thickening of the right colon with surrounding inflammation and suspected enlarging contained perforation along the medial aspect of the ascending colon. As before, this may be secondary to diverticulitis, colitis or neoplasm. No organized fluid collection or pneumoperitoneum. General surgical consultation recommended. 2. Interval increased gastric distension with diffuse small and large bowel distension, most consistent with an ileus.  GI Surgeries / Procedures:  4/19 Re-exploration abdomen, small bowel resection, creation of end ileostomy, abd closure and placement of wound vac 4/17 Re-exploration abdomen, small bowel resection 4/16 Right colectomy, drainage of abscess 4/19 ex lap, end  ileostomy excision, wound vac  Central access: CVC TPN start date: 4/21  Nutritional Goals: -Concentrated TPN to attempt to reduce volume -TPN at goal rate of 90 mL/hr will provide 134 g protein, 2285 kcal  RD Assessment: Estimated Needs Total Energy Estimated Needs: 2250-2450 Total Protein Estimated Needs: 130-160g Total Fluid Estimated Needs: >/= 2L  Current Nutrition: NPO  Plan:   Now:  -Supplemental electrolyte replacement discussed with nephrology as pt on CRRT -Na Phos 30 mmol IV once  At 1800: Increase TPN to 75 mL/hr Concentrated with Clinisol  15% to reduce total volume Will provide 112 g protein, 1904 kcal Electrolytes in TPN: Increase Na, Add back Phos standard concentration Na 120 mEq/L, Phos 15 mmol/L Other electrolytes removed as pt on CRRT, management per nephrology Cl:Ac 1:1 Add standard MVI and trace elements to TPN Thiamine  100 mg IV daily x 5 days Continue sSSI q6h Monitor TPN labs on Mon/Thurs.  Shireen Dory, PharmD, BCPS Clinical Pharmacist 01/22/2024 9:17 AM

## 2024-01-22 NOTE — Progress Notes (Signed)
 Patient ID: Alexander [name], male   DOB: 1960-10-27, 63 y.o.   MRN: [id number]   Follow up - Emergency General Surgery  Patient Details:    Alexander [name] is an 63 y.o. male.  Lines/tubes : Airway 7.5 mm (Active)  Secured at (cm) 23 cm 01/19/24 0329  Measured From Lips 01/19/24 0329  Secured Location Right 01/19/24 0329  Secured By [employer] 01/19/24 0329  Bite Block No 01/19/24 0329  Tube Holder Repositioned Yes 01/19/24 0329  Prone position No 01/19/24 0329  Head position Right 01/18/24 2000  Cuff Pressure (cm H2O) Clear OR 27-39 CmH2O 01/19/24 0329  Site Condition Dry 01/19/24 0329     CVC Double Lumen 01/15/24 Right Internal jugular 16 cm (Active)  Indication for Insertion or Continuance of Line Vasoactive infusions 01/19/24 0400  Site Assessment Clean, Dry, Intact 01/19/24 0400  Proximal Lumen Status Infusing 01/19/24 0400  Distal Lumen Status Infusing 01/19/24 0400  Dressing Type Transparent 01/19/24 0400  Dressing Status Antimicrobial disc/dressing in place;Clean;Dry 01/19/24 0400  Line Care Connections checked and tightened 01/19/24 0400  Dressing Intervention Dressing changed 01/19/24 0545  Dressing Change Due 01/26/24 01/19/24 0545     Arterial Line 01/15/24 Left Radial (Active)  Site Assessment Clean, Dry, Intact 01/19/24 0400  Line Status Pulsatile blood flow 01/19/24 0400  Art Line Waveform Appropriate 01/19/24 0400  Art Line Interventions Connections checked and tightened;Flushed per protocol;Line pulled back 01/19/24 0400  Color/Movement/Sensation Capillary refill less than 3 sec 01/19/24 0400  Dressing Type Transparent 01/19/24 0400  Dressing Status Clean, Dry, Intact;Antimicrobial disc in place 01/19/24 0400  Interventions Other (Comment) 01/19/24 0400  Dressing Change Due 01/22/24 01/19/24 0400     Closed System Drain LLQ Bulb (JP) 19 Fr. (Active)  Site Description Unable to view 01/18/24 2000  Dressing Status Clean, Dry, Intact 01/18/24 2000   Drainage Appearance Bright red;Serosanguineous 01/18/24 2000  Status To gravity (Uncharged) 01/18/24 2000  Output (mL) 20 mL 01/19/24 0800     Negative Pressure Wound Therapy Abdomen Medial;Upper (Active)  Site / Wound Assessment Dressing in place / Unable to assess 01/18/24 2000  Wound filler - Black foam 1 01/18/24 0900  Target Pressure (mmHg) 125 01/18/24 2000  Dressing Status Intact 01/18/24 2000  Drainage Amount Minimal 01/18/24 2000  Drainage Description Sanguineous 01/18/24 2000  Output (mL) 0 mL 01/19/24 0800     NG/OG Vented/Dual Lumen 18 Fr. Oral (Active)  Tube Position (Required) Marking at nare/corner of mouth 01/19/24 0745  Measurement (cm) (Required) 60 cm 01/18/24 2000  Ongoing Placement Verification (Required) (See row information) Yes 01/19/24 0745  Site Assessment Clean, Dry, Intact 01/19/24 0745  Interventions Other (Comment) 01/17/24 0515  Status Low intermittent suction 01/19/24 0745  Amount of suction 55 mmHg 01/18/24 2000  Drainage Appearance Bile;Brown 01/19/24 0745  Intake (mL) 60 mL 01/18/24 0020  Output (mL) 28 mL 01/19/24 0800     Ileostomy Standard (end) RUQ (Active)  Ostomy Pouch 1 piece 01/19/24 0745  Stoma Assessment Red 01/19/24 0745  Peristomal Assessment Intact 01/19/24 0745  Output (mL) 0 mL 01/19/24 0800     Urethral Catheter JAMIE BANKS RN Latex 14 Fr. (Active)  Indication for Insertion or Continuance of Catheter Therapy based on hourly urine output monitoring and documentation for critical condition (NOT STRICT I&O) 01/19/24 0745  Site Assessment Clean, Dry, Intact;Swelling 01/19/24 0745  Catheter Maintenance Bag below level of bladder;Catheter secured;Drainage bag/tubing not touching floor;Insertion date on drainage bag;No dependent loops;Seal intact 01/19/24 0745  Collection Container Standard  drainage bag 01/19/24 0745  Securement Method Adhesive securement device 01/19/24 0745  Urinary Catheter Interventions (if applicable) Unclamped  01/18/24 2000  Input (mL) 10 mL 01/16/24 2047  Output (mL) 11 mL 01/19/24 0800     Fecal Management System 30 mL (Active)  Does patient meet criteria for removal? No 01/19/24 0745  Daily care Skin around tube assessed;Assess location of position indicator line;Bag changed (every 24 hours) 01/19/24 0745  Patient Indicator Assessment Green 01/19/24 0745  Bulb Deflated and Reinflated Yes 01/19/24 0745  Amount in bulb 35 mL 01/19/24 0745  Output (mL) 0 mL 01/19/24 0800    Microbiology/Sepsis markers: Results for orders placed or performed during the hospital encounter of 01/11/24  Culture, blood (single)     Status: None   Collection Time: 01/11/24  7:57 PM   Specimen: BLOOD  Result Value Ref Range Status   Specimen Description   Final    BLOOD BLOOD RIGHT ARM Performed at [facility], [address], [place], [place] [address]    Special Requests   Final    BOTTLES DRAWN AEROBIC AND ANAEROBIC Blood Culture adequate volume Performed at [facility], [address], [place], [place] [address]    Culture  Setup Time   Final    GRAM POSITIVE RODS ANAEROBIC BOTTLE ONLY CRITICAL RESULT CALLED TO, READ BACK BY AND VERIFIED WITH: PHARMD E JACKSON 01/16/2024 @ 0350 BY AB    Culture   Final    EGGERTHELLA SPP Standardized susceptibility testing for this organism is not available. Performed at [facility], [address] [place], [place] [address]    Report Status 01/21/2024 FINAL  Final  MRSA Next Gen by PCR, Nasal     Status: Abnormal   Collection Time: 01/12/24 12:06 AM   Specimen: Nasal Mucosa; Nasal Swab  Result Value Ref Range Status   MRSA by PCR Next Gen DETECTED (A) NOT DETECTED Corrected    Comment: CRITICAL RESULT CALLED TO, READ BACK BY AND VERIFIED WITH: Godwin Lat, RN [phone] 01/12/24 MH (NOTE) The GeneXpert MRSA Assay (FDA approved for NASAL specimens only), is one component of a comprehensive MRSA colonization  surveillance program. It is not intended to diagnose MRSA infection nor to guide or monitor treatment for MRSA infections. Test performance is not FDA approved in patients less than 67 years old. Performed at [facility], [address], [place], [place] [address] CORRECTED ON 04/13 AT 0255: PREVIOUSLY REPORTED AS DETECTED CRITICAL RESULT CALLED TO, READ BACK BY AND VERIFIED WITH: Godwin Lat, RN [phone] 01/12/24 MH   Resp panel by RT-PCR (RSV, Flu A&B, Covid) Anterior Nasal Swab     Status: None   Collection Time: 01/12/24  9:53 AM   Specimen: Anterior Nasal Swab  Result Value Ref Range Status   SARS Coronavirus 2 by RT PCR NEGATIVE NEGATIVE Final    Comment: (NOTE) SARS-CoV-2 target nucleic acids are NOT DETECTED.  The SARS-CoV-2 RNA is generally detectable in upper respiratory specimens during the acute phase of infection. The lowest concentration of SARS-CoV-2 viral copies this assay can detect is 138 copies/mL. A negative result does not preclude SARS-Cov-2 infection and should not be used as the sole basis for treatment or other patient management decisions. A negative result may occur with  improper specimen collection/handling, submission of specimen other than nasopharyngeal swab, presence of viral mutation(s) within the areas targeted by this assay, and inadequate number of viral copies(<138 copies/mL). A negative result must  be combined with clinical observations, patient history, and epidemiological information. The expected result is Negative.  Fact Sheet for Patients:  BloggerCourse.com  Fact Sheet for Healthcare Providers:  SeriousBroker.it  This test is no t yet approved or cleared by the [place]  FDA and  has been authorized for detection and/or diagnosis of SARS-CoV-2 by FDA under an Emergency Use Authorization (EUA). This EUA will remain  in effect (meaning this test can be used) for the  duration of the COVID-19 declaration under Section 564(b)(1) of the Act, 21 U.S.C.section 360bbb-3(b)(1), unless the authorization is terminated  or revoked sooner.       Influenza A by PCR NEGATIVE NEGATIVE Final   Influenza B by PCR NEGATIVE NEGATIVE Final    Comment: (NOTE) The Xpert Xpress SARS-CoV-2/FLU/RSV plus assay is intended as an aid in the diagnosis of influenza from Nasopharyngeal swab specimens and should not be used as a sole basis for treatment. Nasal washings and aspirates are unacceptable for Xpert Xpress SARS-CoV-2/FLU/RSV testing.  Fact Sheet for Patients: BloggerCourse.com  Fact Sheet for Healthcare Providers: SeriousBroker.it  This test is not yet approved or cleared by the [place]  FDA and has been authorized for detection and/or diagnosis of SARS-CoV-2 by FDA under an Emergency Use Authorization (EUA). This EUA will remain in effect (meaning this test can be used) for the duration of the COVID-19 declaration under Section 564(b)(1) of the Act, 21 U.S.C. section 360bbb-3(b)(1), unless the authorization is terminated or revoked.     Resp Syncytial Virus by PCR NEGATIVE NEGATIVE Final    Comment: (NOTE) Fact Sheet for Patients: BloggerCourse.com  Fact Sheet for Healthcare Providers: SeriousBroker.it  This test is not yet approved or cleared by the [place]  FDA and has been authorized for detection and/or diagnosis of SARS-CoV-2 by FDA under an Emergency Use Authorization (EUA). This EUA will remain in effect (meaning this test can be used) for the duration of the COVID-19 declaration under Section 564(b)(1) of the Act, 21 U.S.C. section 360bbb-3(b)(1), unless the authorization is terminated or revoked.  Performed at [facility], [address], [place], [place] [address]   Culture, blood (Routine X 2) w Reflex to ID  Panel     Status: None   Collection Time: 01/13/24  7:16 AM   Specimen: BLOOD LEFT HAND  Result Value Ref Range Status   Specimen Description   Final    BLOOD LEFT HAND Performed at [facility], [address], [place], [place] [address]    Special Requests   Final    BOTTLES DRAWN AEROBIC AND ANAEROBIC Blood Culture results may not be optimal due to an inadequate volume of blood received in culture bottles Performed at [facility], [address] [place], [place] [address]    Culture   Final    NO GROWTH 5 DAYS Performed at [facility], [address] [place], [place] [address]    Report Status 01/18/2024 FINAL  Final  Culture, blood (Routine X 2) w Reflex to ID Panel     Status: None   Collection Time: 01/13/24  7:16 AM   Specimen: BLOOD RIGHT ARM  Result Value Ref Range Status   Specimen Description   Final    BLOOD RIGHT ARM Performed at [facility], [address] [place], [place] [address]    Special Requests   Final    BOTTLES DRAWN AEROBIC AND ANAEROBIC Blood Culture results may not be optimal due to  an inadequate volume of blood received in culture bottles Performed at [facility], [address], [place], [place] [address]    Culture   Final    NO GROWTH 5 DAYS Performed at [facility], [address], [place], [place] [address]    Report Status 01/18/2024 FINAL  Final    Anti-infectives:  Anti-infectives (From admission, onward)    Start     Dose/Rate Route Frequency Ordered Stop   01/18/24 1400  piperacillin -tazobactam (ZOSYN ) IVPB 3.375 g  Status:  Discontinued        3.375 g 12.5 mL/hr over 240 Minutes Intravenous Every 6 hours 01/18/24 1241 01/18/24 1242   01/18/24 1400  piperacillin -tazobactam (ZOSYN ) IVPB 3.375 g        3.375 g 100 mL/hr over 30 Minutes Intravenous Every 6 hours 01/18/24 1242 01/23/24 2359   01/18/24 0000  piperacillin -tazobactam (ZOSYN ) IVPB 2.25 g  Status:   Discontinued        2.25 g 100 mL/hr over 30 Minutes Intravenous Every 6 hours 01/17/24 2120 01/18/24 1241   01/12/24 0200  piperacillin -tazobactam (ZOSYN ) IVPB 3.375 g  Status:  Discontinued        3.375 g 12.5 mL/hr over 240 Minutes Intravenous Every 8 hours 01/11/24 2120 01/17/24 2120   01/11/24 1815  piperacillin -tazobactam (ZOSYN ) IVPB 3.375 g        3.375 g 100 mL/hr over 30 Minutes Intravenous  Once 01/11/24 1806 01/11/24 1921       Best Practice/Protocols:  VTE Prophylaxis: Mechanical GI Prophylaxis: Proton Pump Inhibitor Continous Sedation  Consults: Treatment Team:  Ccs, Md, MD Chucky Craver, MD    Studies:    Events:  Subjective:    Overnight Issues:  Got 1u prbc for hgb 6.9 overnight; pressor requirement down started on CRRT yesterday Objective:  Vital signs for last 24 hours: Temp:  [97.7 F (36.5 C)-99.1 F (37.3 C)] 98.7 F (37.1 C) (04/23 0758) Pulse Rate:  [69-88] 88 (04/23 1003) Resp:  [10-23] 23 (04/23 1003) BP: (154)/(57) 154/57 (04/23 0300) SpO2:  [98 %-100 %] 99 % (04/23 1003) Arterial Line BP: (114-170)/(44-65) 129/45 (04/23 0637) FiO2 (%):  [28 %] 28 % (04/23 0758) Weight:  [83.5 kg] 83.5 kg (04/23 0435)  Hemodynamic parameters for last 24 hours:    Intake/Output from previous day: 04/22 0701 - 04/23 0700 In: 2095.5 [I.V.:1745.8; NG/GT:15; IV Piggyback:198.7] Out: 3680 [Urine:161; Emesis/NG output:650; Drains:314]  Intake/Output this shift: Total I/O In: 555.1 [I.V.:358.9; Other:81; IV Piggyback:115.3] Out: 834 [Urine:80; Emesis/NG output:150; Drains:65; Stool:60]  Vent settings for last 24 hours: Vent Mode: CPAP;PSV FiO2 (%):  [28 %] 28 % Set Rate:  [14 bmp] 14 bmp Vt Set:  [560 mL] 560 mL PEEP:  [5 cmH20] 5 cmH20 Pressure Support:  [5 cmH20] 5 cmH20 Plateau Pressure:  [16 cmH20-28 cmH20] 16 cmH20  Physical Exam:  Intubated Soft, some distension JP - a little bright red blood Soft tissue wound vac secure Ileostomy  -edematous/viable; some liquid stool in bag B/l LE edema Arouses to voice  Results for orders placed or performed during the hospital encounter of 01/11/24 (from the past 24 hours)  Renal function panel (daily at 1600)     Status: Abnormal   Collection Time: 01/21/24  5:29 PM  Result Value Ref Range   Sodium 137 135 - 145 mmol/L   Potassium 4.2 3.5 - 5.1 mmol/L   Chloride 103 98 - 111 mmol/L   CO2 29 22 - 32 mmol/L  Glucose, Bld 116 (H) 70 - 99 mg/dL   BUN 39 (H) 8 - 23 mg/dL   Creatinine, Ser 1.61 (H) 0.61 - 1.24 mg/dL   Calcium  7.1 (L) 8.9 - 10.3 mg/dL   Phosphorus 2.6 2.5 - 4.6 mg/dL   Albumin  1.7 (L) 3.5 - 5.0 g/dL   GFR, Estimated 32 (L) >60 mL/min   Anion gap 5 5 - 15  Glucose, capillary     Status: Abnormal   Collection Time: 01/21/24  5:55 PM  Result Value Ref Range   Glucose-Capillary 118 (H) 70 - 99 mg/dL  Glucose, capillary     Status: Abnormal   Collection Time: 01/21/24 11:40 PM  Result Value Ref Range   Glucose-Capillary 131 (H) 70 - 99 mg/dL  Renal function panel (daily at 0500)     Status: Abnormal   Collection Time: 01/22/24  4:17 AM  Result Value Ref Range   Sodium 138 135 - 145 mmol/L   Potassium 4.1 3.5 - 5.1 mmol/L   Chloride 103 98 - 111 mmol/L   CO2 26 22 - 32 mmol/L   Glucose, Bld 137 (H) 70 - 99 mg/dL   BUN 36 (H) 8 - 23 mg/dL   Creatinine, Ser 0.96 (H) 0.61 - 1.24 mg/dL   Calcium  7.3 (L) 8.9 - 10.3 mg/dL   Phosphorus 2.2 (L) 2.5 - 4.6 mg/dL   Albumin  1.7 (L) 3.5 - 5.0 g/dL   GFR, Estimated 35 (L) >60 mL/min   Anion gap 9 5 - 15  Magnesium      Status: Abnormal   Collection Time: 01/22/24  4:17 AM  Result Value Ref Range   Magnesium  2.5 (H) 1.7 - 2.4 mg/dL  APTT     Status: None   Collection Time: 01/22/24  4:17 AM  Result Value Ref Range   aPTT 33 24 - 36 seconds  Hepatic function panel     Status: Abnormal   Collection Time: 01/22/24  4:17 AM  Result Value Ref Range   Total Protein 5.1 (L) 6.5 - 8.1 g/dL   Albumin  1.7 (L) 3.5 - 5.0  g/dL   AST 85 (H) 15 - 41 U/L   ALT 265 (H) 0 - 44 U/L   Alkaline Phosphatase 78 38 - 126 U/L   Total Bilirubin 2.2 (H) 0.0 - 1.2 mg/dL   Bilirubin, Direct 1.2 (H) 0.0 - 0.2 mg/dL   Indirect Bilirubin 1.0 (H) 0.3 - 0.9 mg/dL  Glucose, capillary     Status: Abnormal   Collection Time: 01/22/24  6:23 AM  Result Value Ref Range   Glucose-Capillary 132 (H) 70 - 99 mg/dL  CBC     Status: Abnormal   Collection Time: 01/22/24 10:33 AM  Result Value Ref Range   WBC 15.8 (H) 4.0 - 10.5 K/uL   RBC 3.18 (L) 4.22 - 5.81 MIL/uL   Hemoglobin 9.3 (L) 13.0 - 17.0 g/dL   HCT 04.5 (L) 40.9 - 81.1 %   MCV 95.9 80.0 - 100.0 fL   MCH 29.2 26.0 - 34.0 pg   MCHC 30.5 30.0 - 36.0 g/dL   RDW 91.4 (H) 78.2 - 95.6 %   Platelets 139 (L) 150 - 400 K/uL   nRBC 0.3 (H) 0.0 - 0.2 %  Glucose, capillary     Status: Abnormal   Collection Time: 01/22/24 11:44 AM  Result Value Ref Range   Glucose-Capillary 132 (H) 70 - 99 mg/dL   Comment 1 Notify RN    Comment 2 Document in  Chart       Latest Ref Rng & Units 01/22/2024   10:33 AM 01/21/2024    5:04 AM 01/20/2024    4:27 AM  CBC  WBC 4.0 - 10.5 K/uL 15.8   14.9   Hemoglobin 13.0 - 17.0 g/dL 9.3   9.8   Hematocrit 39.0 - 52.0 % 30.5   29.3   Platelets 150 - 400 K/uL 139  93  103    103      Assessment & Plan: Present on Admission:  (Resolved) Severe sepsis (HCC)  Essential hypertension, benign  History of pulmonary embolism  Diverticulitis  AKI (acute kidney injury) (HCC)  Hyperlipidemia  Spontaneous perforation of colon (HCC)  Ascites  History of pulmonary embolus (PE)  Pulmonary hypertension (HCC)  Cor pulmonale (HCC)  Peripheral arterial disease (HCC)  LAPAROTOMY, EXPLORATORY RIGHT COLECTOMY (without anastomosis) PLACEMENT OF ABTHERA WOUND VAC DRAINAGE OF INTRA-ABDOMINAL ABSCESS  4/16 EW   RE-EXPLORATION ABDOMEN, PLACEMENT OF ABTHERA WOUND VAC, SMALL BOWEL RESECTION 01/16/2024 EW  Re-exploration abdomen, small bowel resection, creation of  end ileostomy, abd closure, placement wound vac 4/19 EW   FEN - NPO, TPN till return of bowel function, anticipate prolonged ileus   Septic shock - off pressors - per CCM ABL anemia - monitor and transfuse as needed(13 u prbc total, 1u cryo, 6u FFP, 1 plt) Coagulopathy - resolved AKI - started CRRT 4/19,  defer to CCM/renal ID - cont broad spectrum IV abx; have source control now; prob just needs total of 5 days at this point Acute hypoxemic resp failure - per CCM  H/o VTE/PE/DVT - scds for now; was on coumadin   Dispo: ICU   LOS: 11 days     01/22/2024   Junie Olds, MD  [facility] - [facility]

## 2024-01-22 NOTE — Procedures (Signed)
 Extubation Procedure Note  Patient Details:   Name: Alexander [name] DOB: 1961-06-26 MRN: [id number]   Airway Documentation:    Vent end date: 01/22/24 Vent end time: 1003   Evaluation  O2 sats: stable throughout Complications: No apparent complications Patient did tolerate procedure well. Bilateral Breath Sounds: Clear, Diminished   Patient tolerated wean. MD ordered to extubate. Positive for cuff leak. Patient extubated to a 2L [place]. No signs of dyspnea or stridor noted. Patient did not say his name as instructed. RN at bedside. Patient resting comfortably. Will continue to monitor.   Jina Mound 01/22/2024, 10:08 AM

## 2024-01-22 NOTE — Plan of Care (Signed)
  Problem: Clinical Measurements: Goal: Diagnostic test results will improve Outcome: Progressing Goal: Respiratory complications will improve Outcome: Progressing Note: Extubated today and maintains good O2 Saturation on 2L [place]   Problem: Respiratory: Goal: Ability to maintain a clear airway and adequate ventilation will improve Outcome: Progressing   Problem: Role Relationship: Goal: Method of communication will improve Outcome: Progressing   Problem: Safety: Goal: Non-violent Restraint(s) Outcome: Progressing Note: Restraints removed, only in safety mitts

## 2024-01-22 NOTE — Progress Notes (Signed)
   01/22/24 1618  Spiritual Encounters  Type of Visit Initial  Care provided to: Pt and family  Reason for visit Routine spiritual support  OnCall Visit No   Brief visit with patient and niece during rounds. Asked if I can assist in any way. Patient response was to "get" him "out of here". Introduced self as Orthoptist and offered spiritual care.  Response was "Not at this time"

## 2024-01-23 ENCOUNTER — Inpatient Hospital Stay (HOSPITAL_COMMUNITY)

## 2024-01-23 DIAGNOSIS — I469 Cardiac arrest, cause unspecified: Secondary | ICD-10-CM | POA: Diagnosis not present

## 2024-01-23 DIAGNOSIS — D62 Acute posthemorrhagic anemia: Secondary | ICD-10-CM | POA: Diagnosis not present

## 2024-01-23 DIAGNOSIS — K631 Perforation of intestine (nontraumatic): Secondary | ICD-10-CM | POA: Diagnosis not present

## 2024-01-23 DIAGNOSIS — J9601 Acute respiratory failure with hypoxia: Secondary | ICD-10-CM | POA: Diagnosis not present

## 2024-01-23 LAB — COMPREHENSIVE METABOLIC PANEL WITH GFR
ALT: 179 U/L — ABNORMAL HIGH (ref 0–44)
AST: 57 U/L — ABNORMAL HIGH (ref 15–41)
Albumin: 1.7 g/dL — ABNORMAL LOW (ref 3.5–5.0)
Alkaline Phosphatase: 69 U/L (ref 38–126)
Anion gap: 8 (ref 5–15)
BUN: 32 mg/dL — ABNORMAL HIGH (ref 8–23)
CO2: 24 mmol/L (ref 22–32)
Calcium: 7.1 mg/dL — ABNORMAL LOW (ref 8.9–10.3)
Chloride: 102 mmol/L (ref 98–111)
Creatinine, Ser: 1.94 mg/dL — ABNORMAL HIGH (ref 0.61–1.24)
GFR, Estimated: 39 mL/min — ABNORMAL LOW (ref >60)
Glucose, Bld: 154 mg/dL — ABNORMAL HIGH (ref 70–99)
Potassium: 3.6 mmol/L (ref 3.5–5.1)
Sodium: 134 mmol/L — ABNORMAL LOW (ref 135–145)
Total Bilirubin: 2.2 mg/dL — ABNORMAL HIGH (ref 0.0–1.2)
Total Protein: 5.4 g/dL — ABNORMAL LOW (ref 6.5–8.1)

## 2024-01-23 LAB — RENAL FUNCTION PANEL
Albumin: 1.7 g/dL — ABNORMAL LOW (ref 3.5–5.0)
Anion gap: 7 (ref 5–15)
BUN: 32 mg/dL — ABNORMAL HIGH (ref 8–23)
CO2: 24 mmol/L (ref 22–32)
Calcium: 7.2 mg/dL — ABNORMAL LOW (ref 8.9–10.3)
Chloride: 104 mmol/L (ref 98–111)
Creatinine, Ser: 1.9 mg/dL — ABNORMAL HIGH (ref 0.61–1.24)
GFR, Estimated: 40 mL/min — ABNORMAL LOW (ref >60)
Glucose, Bld: 155 mg/dL — ABNORMAL HIGH (ref 70–99)
Phosphorus: 2.6 mg/dL (ref 2.5–4.6)
Potassium: 3.6 mmol/L (ref 3.5–5.1)
Sodium: 135 mmol/L (ref 135–145)

## 2024-01-23 LAB — CBC
HCT: 28.5 % — ABNORMAL LOW (ref 39.0–52.0)
Hemoglobin: 9 g/dL — ABNORMAL LOW (ref 13.0–17.0)
MCH: 29.8 pg (ref 26.0–34.0)
MCHC: 31.6 g/dL (ref 30.0–36.0)
MCV: 94.4 fL (ref 80.0–100.0)
Platelets: 227 10*3/uL (ref 150–400)
RBC: 3.02 MIL/uL — ABNORMAL LOW (ref 4.22–5.81)
RDW: 19.1 % — ABNORMAL HIGH (ref 11.5–15.5)
WBC: 18.5 10*3/uL — ABNORMAL HIGH (ref 4.0–10.5)
nRBC: 0 % (ref 0.0–0.2)

## 2024-01-23 LAB — GLUCOSE, CAPILLARY
Glucose-Capillary: 116 mg/dL — ABNORMAL HIGH (ref 70–99)
Glucose-Capillary: 126 mg/dL — ABNORMAL HIGH (ref 70–99)
Glucose-Capillary: 128 mg/dL — ABNORMAL HIGH (ref 70–99)
Glucose-Capillary: 137 mg/dL — ABNORMAL HIGH (ref 70–99)

## 2024-01-23 LAB — APTT: aPTT: 46 s — ABNORMAL HIGH (ref 24–36)

## 2024-01-23 LAB — PREALBUMIN: Prealbumin: 8 mg/dL — ABNORMAL LOW (ref 18–38)

## 2024-01-23 LAB — HEPARIN LEVEL (UNFRACTIONATED): Heparin Unfractionated: <0.1 [IU]/mL — ABNORMAL LOW (ref 0.30–0.70)

## 2024-01-23 LAB — MAGNESIUM: Magnesium: 2.5 mg/dL — ABNORMAL HIGH (ref 1.7–2.4)

## 2024-01-23 LAB — PHOSPHORUS: Phosphorus: 2.7 mg/dL (ref 2.5–4.6)

## 2024-01-23 MED ORDER — LABETALOL HCL 5 MG/ML IV SOLN
10.0000 mg | INTRAVENOUS | Status: DC | PRN
  Administered 2024-01-23: 10 mg via INTRAVENOUS
  Filled 2024-01-23 (×2): qty 4

## 2024-01-23 MED ORDER — POTASSIUM CHLORIDE 10 MEQ/50ML IV SOLN
10.0000 meq | INTRAVENOUS | Status: AC
  Administered 2024-01-23 (×2): 10 meq via INTRAVENOUS
  Filled 2024-01-23 (×2): qty 50

## 2024-01-23 MED ORDER — TRACE MINERALS CU-MN-SE-ZN 300-55-60-3000 MCG/ML IV SOLN
INTRAVENOUS | Status: AC
  Filled 2024-01-23: qty 892.8

## 2024-01-23 MED ORDER — ACETAMINOPHEN 10 MG/ML IV SOLN: 650.0000 mg | Freq: Three times a day (TID) | INTRAVENOUS | Status: DC

## 2024-01-23 MED ORDER — HEPARIN (PORCINE) 25000 UT/250ML-% IV SOLN
1950.0000 [IU]/h | INTRAVENOUS | Status: DC
  Administered 2024-01-23: 950 [IU]/h via INTRAVENOUS
  Administered 2024-01-26 – 2024-01-27 (×4): 1950 [IU]/h via INTRAVENOUS
  Filled 2024-01-23 (×8): qty 250

## 2024-01-23 MED ORDER — INSULIN ASPART 100 UNIT/ML IJ SOLN
0.0000 [IU] | Freq: Four times a day (QID) | INTRAMUSCULAR | Status: DC
  Administered 2024-01-23 – 2024-01-24 (×2): 2 [IU] via SUBCUTANEOUS
  Administered 2024-01-25: 3 [IU] via SUBCUTANEOUS
  Administered 2024-01-25 – 2024-01-27 (×7): 2 [IU] via SUBCUTANEOUS

## 2024-01-23 MED ORDER — HYDROMORPHONE HCL 1 MG/ML IJ SOLN
0.5000 mg | INTRAMUSCULAR | Status: DC | PRN
  Administered 2024-01-23 – 2024-01-26 (×15): 1 mg via INTRAVENOUS
  Administered 2024-01-26: 0.5 mg via INTRAVENOUS
  Administered 2024-01-26 – 2024-01-28 (×18): 1 mg via INTRAVENOUS
  Filled 2024-01-23 (×33): qty 1

## 2024-01-23 MED ORDER — HYDROMORPHONE HCL 1 MG/ML IJ SOLN
0.5000 mg | Freq: Once | INTRAMUSCULAR | Status: AC
  Administered 2024-01-23: 0.5 mg via INTRAVENOUS
  Filled 2024-01-23: qty 1

## 2024-01-23 MED ORDER — ACETAMINOPHEN 10 MG/ML IV SOLN
1000.0000 mg | Freq: Two times a day (BID) | INTRAVENOUS | Status: AC
Start: 2024-01-23 — End: 2024-01-23
  Administered 2024-01-23 (×2): 1000 mg via INTRAVENOUS
  Filled 2024-01-23 (×2): qty 100

## 2024-01-23 MED ORDER — HYDROMORPHONE HCL 1 MG/ML IJ SOLN
0.5000 mg | INTRAMUSCULAR | Status: DC | PRN
  Administered 2024-01-23 (×2): 0.5 mg via INTRAVENOUS
  Filled 2024-01-23 (×2): qty 1

## 2024-01-23 NOTE — Plan of Care (Signed)
  Problem: Clinical Measurements: Goal: Ability to maintain clinical measurements within normal limits will improve Outcome: Progressing Goal: Diagnostic test results will improve Outcome: Progressing Goal: Respiratory complications will improve Outcome: Progressing   Problem: Coping: Goal: Level of anxiety will decrease Outcome: Progressing   Problem: Pain Managment: Goal: General experience of comfort will improve and/or be controlled Outcome: Progressing   Problem: Respiratory: Goal: Ability to maintain a clear airway and adequate ventilation will improve Outcome: Progressing

## 2024-01-23 NOTE — Progress Notes (Signed)
 [name] NEPHROLOGY PROGRESS NOTE  Assessment/ Plan: Pt is a 63 y.o. yo male with past medical history of DVT, PE on Coumadin  admitted on 4/12 with rectal bleed and hypotension seen for AKI.  # AKI - mostly anuric, creat 1.0 early in this hospital stay. After onset of severe shock 4/16 pt had progressive AKI. UA negative. Renal US  shows no obstruction. AKI due to ATN primarily due to shock +/- IV contrast.  CRRT started 4/19. Cont CRRT.  On 4K bath.  UF as tolerated.  Patient is now extubated and off of pressors for last 2 days.  Urine output is very minimal.  Electrolytes and volume status acceptable.  We will go ahead and stop CRRT today and watch for renal recovery.  Continue with strict ins and outs and daily lab.  If no renal recovery in next few days then he will likely need intermittent HD.    # Volume overload: Improved with CRRT.  # Hypophosphatemia due to CRRT: Repleted, monitor.  #S/P cardiac arrest: 4/17  #Shock/ hypotension: Off of pressors now.  #Metabolic acidosis: resolved w/ CRRT.  IV bicarb gtt was dced.  # Acute diverticulitis: w/ perforated ascending colon, s/p ileostomy, R hemicolectomy and some small bowel resection. Per gen surg/ pmd.   #VDRF: per CCM, extubated now.  Discussed with ICU team.  Subjective: Seen and examined in ICU.  He is extubated and off of pressure.  He is alert awake.  Urine output is recorded around 200 cc.  No other new event.  Objective Vital signs in last 24 hours: Vitals:   01/22/24 2000 01/23/24 0000 01/23/24 0400 01/23/24 0500  BP:      Pulse:      Resp:      Temp: 98.7 F (37.1 C) 99.4 F (37.4 C) 98.3 F (36.8 C)   TempSrc: Oral Axillary Oral   SpO2:      Weight:    81.2 kg  Height:       Weight change: -2.3 kg  Intake/Output Summary (Last 24 hours) at 01/23/2024 0750 Last data filed at 01/23/2024 0727 Gross per 24 hour  Intake 2520.59 ml  Output 4818 ml  Net -2297.41 ml       Labs: RENAL  PANEL Recent Labs  Lab 01/18/24 0424 01/18/24 0823 01/19/24 0108 01/19/24 1550 01/21/24 0504 01/21/24 1729 01/22/24 0417 01/22/24 1557 01/23/24 0503 01/23/24 0505  NA 137   < > 136   < > 134* 137 138 138 135 134*  K 5.4*   < > 5.1   < > 4.3 4.2 4.1 3.8 3.6 3.6  CL 92*   < > 95*   < > 100 103 103 104 104 102  CO2 27   < > 28   < > 26 29 26 26 24 24   GLUCOSE 108*   < > 108*   < > 134* 116* 137* 136* 155* 154*  BUN 70*   < > 70*   < > 40* 39* 36* 33* 32* 32*  CREATININE 5.38*   < > 4.38*   < > 2.40* 2.28* 2.13* 2.12* 1.90* 1.94*  CALCIUM  5.7*   < > 6.3*   < > 7.1* 7.1* 7.3* 7.2* 7.2* 7.1*  MG 2.0  --  2.2  --  2.7*  --  2.5*  --   --  2.5*  PHOS 9.2*   < > 6.4*   < > 3.9 2.6 2.2* 3.2 2.6 2.7  ALBUMIN   --    < >  2.0*  1.9*   < > 1.7*  1.7* 1.7* 1.7*  1.7* 1.6* 1.7* 1.7*   < > = values in this interval not displayed.    Liver Function Tests: Recent Labs  Lab 01/21/24 0504 01/21/24 1729 01/22/24 0417 01/22/24 1557 01/23/24 0503 01/23/24 0505  AST 146*  --  85*  --   --  57*  ALT 415*  --  265*  --   --  179*  ALKPHOS 83  --  78  --   --  69  BILITOT 1.9*  --  2.2*  --   --  2.2*  PROT 5.1*  --  5.1*  --   --  5.4*  ALBUMIN  1.7*  1.7*   < > 1.7*  1.7* 1.6* 1.7* 1.7*   < > = values in this interval not displayed.   No results for input(s): "LIPASE", "AMYLASE" in the last 168 hours. No results for input(s): "AMMONIA" in the last 168 hours. CBC: Recent Labs    07/19/23 0448 07/22/23 0441 12/03/23 1437 12/04/23 1531 12/05/23 0036 01/11/24 1458 01/11/24 1957 01/12/24 0056 01/12/24 1044 01/12/24 1552 01/19/24 0653 01/19/24 1018 01/20/24 0427 01/22/24 1033 01/23/24 0505  HGB 10.1*   < > 7.6 Repeated and verified X2.*   < >  --    < > 6.7*   < >  --    < > 9.9* 9.6* 9.8* 9.3* 9.0*  MCV 92.7   < > 75.5*   < >  --    < > 76.6*   < >  --    < >  --   --  89.1 95.9 94.4  VITAMINB12 222  --   --   --  100*  --   --   --  93*  --   --   --   --   --   --   FOLATE  7.5  --   --   --  8.0  --   --   --  9.3  --   --   --   --   --   --   FERRITIN 25  --  4.5*  --  3*  --  19*  --   --   --   --   --   --   --   --   TIBC 328  --  382.2  --  364  --  293  --   --   --   --   --   --   --   --   IRON  25*  --  15*  --  11*  --  11*  --   --   --   --   --   --   --   --   RETICCTPCT 1.4  --   --   --  0.7  --  0.8  --   --   --   --   --   --   --   --    < > = values in this interval not displayed.    Cardiac Enzymes: No results for input(s): "CKTOTAL", "CKMB", "CKMBINDEX", "TROPONINI" in the last 168 hours. CBG: Recent Labs  Lab 01/22/24 0623 01/22/24 1144 01/22/24 1753 01/22/24 2355 01/23/24 0620  GLUCAP 132* 132* 130* 129* 137*    Iron  Studies: No results for input(s): "IRON ", "TIBC", "TRANSFERRIN", "FERRITIN" in the last 72 hours. Studies/Results: DG Abd 1 View  Result Date: 01/22/2024 CLINICAL DATA:  NG tube placement EXAM: ABDOMEN - 1 VIEW COMPARISON:  01/15/2024, CT 01/14/2024. FINDINGS: NG tube tip is in the fundus of the stomach with the side port near the GE junction. Decompressed stomach. Slight prominence of upper abdominal small bowel loops. These have decreased/improved since prior CT from 01/14/2024. IMPRESSION: NG tube tip in the fundus of the stomach with side port near the GE junction. Slight prominence of upper abdominal small bowel loops, improving since prior CT. Electronically Signed   By: Janeece Mechanic M.D.   On: 01/22/2024 10:42     Medications: Infusions:  sodium chloride  10 mL/hr at 01/23/24 0700   heparin  10,000 units/ 20 mL infusion syringe 750 Units/hr (01/23/24 0700)   piperacillin -tazobactam Stopped (01/23/24 0232)   prismasol  BGK 4/2.5 1,500 mL/hr at 01/23/24 0416   prismasol  BGK 4/2.5 400 mL/hr at 01/22/24 2115   prismasol  BGK 4/2.5 400 mL/hr at 01/22/24 2114   TPN ADULT (ION) 75 mL/hr at 01/23/24 0700    Scheduled Medications:  Chlorhexidine  Gluconate Cloth  6 each Topical Daily   insulin  aspart  0-9 Units  Subcutaneous Q6H   mouth rinse  15 mL Mouth Rinse 4 times per day   pantoprazole  (PROTONIX ) IV  40 mg Intravenous Q12H   thiamine  (VITAMIN B1) injection  100 mg Intravenous Daily    have reviewed scheduled and prn medications.  Physical Exam: General: Ill-looking male, extubated.  Not in distress Heart:RRR, s1s2 nl Lungs: Coarse breath sound bilateral. Abdomen:soft, drain in place. Extremities:No edema Dialysis Access: Groin HD catheter  Alexander [name] 01/23/2024,7:50 AM  LOS: 12 days

## 2024-01-23 NOTE — Progress Notes (Signed)
 NAME:  Alexander [name], MRN:  [id number], DOB:  1961/08/05, LOS: 12 ADMISSION DATE:  01/11/2024, CONSULTATION DATE:  01/15/24 REFERRING MD:  Dr Elvan Hamel, CHIEF COMPLAINT:  vent management   History of Present Illness:  Pt is encephelopathic; therefore, this HPI is obtained from chart review. Alexander [name] is a 63 y.o. male who has a PMH as outlined below including a limited to PE/DVT on Coumadin .  He was admitted to [facility] on 01/11/2024 with rectal bleeding and hypotension.  Symptoms had started 1 day earlier and had persisted.   CT in the ED was concerning for diverticulitis versus colitis versus neoplasm.  He was started on Zosyn  and his Coumadin  was held.    He was evaluated by gastroenterology who recommended antibiotic therapy and conservative management in the interim with possible EGD/colonoscopy at a later date (possibly 4 to 6 weeks after completion of 10 days of antibiotics alone as he did not become transfusion dependent).  Unfortunately after his admission, he required multiple transfusions for persistent anemia and bleeding.  He had repeat CT scan on 4/15 that demonstrated ongoing inflammation and a large contained perforation without abscess and pneumoperitoneum, increased gaseous distention of small and large bowel distention consistent with ileus.  He was evaluated by general surgery who initially opted for conservative management.  Unfortunately he had ongoing abdominal pain and unfortunately 16, decision was made to go to the OR for further evaluation management.   He was taken to the OR and had an exploratory laparotomy where he was found to have a perforated ascending colon with intra-abdominal abscess and ultimately underwent with right colectomy without anastomosis, placement of wound VAC, and drainage of intra-abdominal abscesses.  He was left open and plan was for return trip to the OR on Friday/18.   Return to the ICU on the ventilator, and PCCM was asked to assist with ventilator and  medical management.  Pertinent  Medical History  has Elevated uric acid in blood; Chronic GERD; Chronic foot pain, right; Chronic pain of right ankle; Acute pulmonary embolism (HCC); DVT (deep venous thrombosis) (HCC); Essential hypertension, benign; Gout; Pulmonary embolus (HCC); Hepatic steatosis; Spinal stenosis; Aortic atherosclerosis (HCC); Peripheral arterial disease (HCC); Sleep disturbance; Diverticulosis; High risk medication use; S/P surgical manipulation of ankle joint; Hyperlipidemia; Noncompliance; Impaired fasting blood sugar; Chronic diarrhea; Pulmonary hypertension (HCC); Cor pulmonale (HCC); Chronic thromboembolic disease (HCC); Thrombocytopenia (HCC); History of pulmonary embolism; Diverticulitis; Symptomatic anemia; AKI (acute kidney injury) (HCC); Lower GI bleed; Iron  deficiency anemia; Diverticulitis of colon; ABLA (acute blood loss anemia); Hematochezia; Acute blood loss anemia; Rectal bleeding; Generalized abdominal pain; and Colon perforation (HCC) on their problem list.   Significant Hospital Events: Including procedures, antibiotic start and stop dates in addition to other pertinent events   4/14 admit 4/16 to OR for ex lap, returned to ICU on vent 4/16 cardiorespiratory arrest, achieved ROSC.  Coagulopathy 4/17 blood products + multiple pressors  4/18 back to the OR 4/19 encephalopathic. Back to the OR this morning-s/p exploratory laparotomy, ileostomy creation, small bowel resection, wound VAC in place 4/21 weaning pressors  4/23 extubated; off pressors   Interim History / Subjective:  Extubated yesterday Remains on crrt Weak but aox3; very weak extremities States having moderate pain all over  Objective   Blood pressure (!) 154/57, pulse 98, temperature 98.3 F (36.8 C), temperature source Oral, resp. rate 17, height 5\' 9"  (1.753 m), weight 81.2 kg, SpO2 98%.    Vent Mode: CPAP;PSV FiO2 (%):  [28 %] 28 % PEEP:  [  5 cmH20] 5 cmH20 Pressure Support:  [5 cmH20]  5 cmH20   Intake/Output Summary (Last 24 hours) at 01/23/2024 0706 Last data filed at 01/23/2024 0700 Gross per 24 hour  Intake 2520.59 ml  Output 4768 ml  Net -2247.41 ml   Filed Weights   01/21/24 0500 01/22/24 0435 01/23/24 0500  Weight: 85.9 kg 83.5 kg 81.2 kg    Examination:  General:  NAD HEENT: MM pink/moist; [place] in place Neuro: Aox3; very weak but able to move extremities CV: s1s2, RRR, no m/r/g PULM:  dim clear BS bilaterally; [facility]: ostomy, jp, and wound vac in place Extremities: warm/dry, trace ble edema  Skin: no rashes or lesions  Resolved Hospital Problem list   Shock, improving (hemorrhagic, septic)  In-hospital cardiac arrest  -4/16, brief. ROSC achieved by time EM team arrived   Assessment & Plan:    Acute hypoxic resp failure Hx tobacco use P: -extubated yesterday -pulm toiletry -pt/ot  R colon perf  -s/p ex lap R colectomy intra-abd abscess drainage 4/16 -return OR 4/17, small bowel resection  -return OR 4/19 ileostomy creation  P: -CCS following; appreciate recs -cont zosyn  -cont tpn -OGT LIMWS -pain management  ABLA Thrombocytopenia P: -trend cbc  Hx of PE on warfarin -AC on hold since admission P: -speak with CCS and consider trialing heparin  IV  AKI  Hyponatremia Hypocalcemia Hypermagnesemia Scrotal edema  P: -nephro following -currently on CRRT; likely transition to iHD -Trend BMP / urinary output -Replace electrolytes as indicated -Avoid nephrotoxic agents, ensure adequate renal perfusion  Elevated LFTs, improving  P: -PRN LFTs   Left femoral head AVN necrosis P: - supportive care.    Best Practice (right click and "Reselect all SmartList Selections" daily)   Diet/type: NPO; TPN DVT prophylaxis SCD; consider systemic heparin  Pressure ulcer(s): N/A GI prophylaxis: PPI Lines: Central line Foley:  Yes, and it is still needed Code Status:  full code Last date of multidisciplinary goals of care discussion  [4/23 family/patient updated at bedside]  Labs   CBC: Recent Labs  Lab 01/16/24 1246 01/16/24 1552 01/17/24 0514 01/17/24 0757 01/17/24 1702 01/17/24 2222 01/19/24 0541 01/19/24 0653 01/19/24 1018 01/20/24 0427 01/21/24 0504 01/22/24 1033 01/23/24 0505  WBC 14.9*  --  19.8*  --  20.6*  --   --   --   --  14.9*  --  15.8* 18.5*  NEUTROABS 12.4*  --  16.7*  --  16.9*  --   --   --   --   --   --   --   --   HGB 8.1*   < > 7.3*   < > 8.3*   < >  --  9.9* 9.6* 9.8*  --  9.3* 9.0*  HCT 26.4*   < > 20.3*   < > 23.8*   < >  --  29.2* 28.8* 29.3*  --  30.5* 28.5*  MCV 93.6  --  85.3  --  85.0  --   --   --   --  89.1  --  95.9 94.4  PLT 80*  80*   < > 152   < > 159   < > 114*  --   --  103*  103* 93* 139* 227   < > = values in this interval not displayed.    Basic Metabolic Panel: Recent Labs  Lab 01/18/24 0424 01/18/24 1610 01/19/24 0108 01/19/24 1550 01/21/24 0504 01/21/24 1729 01/22/24 0417 01/22/24 1557 01/23/24 0503 01/23/24  0505  NA 137   < > 136   < > 134* 137 138 138 135 134*  K 5.4*   < > 5.1   < > 4.3 4.2 4.1 3.8 3.6 3.6  CL 92*   < > 95*   < > 100 103 103 104 104 102  CO2 27   < > 28   < > 26 29 26 26 24 24   GLUCOSE 108*   < > 108*   < > 134* 116* 137* 136* 155* 154*  BUN 70*   < > 70*   < > 40* 39* 36* 33* 32* 32*  CREATININE 5.38*   < > 4.38*   < > 2.40* 2.28* 2.13* 2.12* 1.90* 1.94*  CALCIUM  5.7*   < > 6.3*   < > 7.1* 7.1* 7.3* 7.2* 7.2* 7.1*  MG 2.0  --  2.2  --  2.7*  --  2.5*  --   --  2.5*  PHOS 9.2*   < > 6.4*   < > 3.9 2.6 2.2* 3.2 2.6 2.7   < > = values in this interval not displayed.   GFR: Estimated Creatinine Clearance: 40 mL/min (A) (by C-G formula based on SCr of 1.94 mg/dL (H)). Recent Labs  Lab 01/17/24 0514 01/17/24 1702 01/20/24 0427 01/22/24 1033 01/23/24 0505  WBC 19.8* 20.6* 14.9* 15.8* 18.5*  LATICACIDVEN 7.0*  --   --   --   --     Liver Function Tests: Recent Labs  Lab 01/19/24 0108 01/19/24 1550 01/20/24 0427  01/20/24 1524 01/21/24 0504 01/21/24 1729 01/22/24 0417 01/22/24 1557 01/23/24 0503 01/23/24 0505  AST 1,044*  --  340*  --  146*  --  85*  --   --  57*  ALT 1,133*  --  666*  --  415*  --  265*  --   --  179*  ALKPHOS 99  --  95  --  83  --  78  --   --  69  BILITOT 2.8*  --  2.6*  --  1.9*  --  2.2*  --   --  2.2*  PROT 4.4*  --  5.0*  --  5.1*  --  5.1*  --   --  5.4*  ALBUMIN  2.0*  1.9*   < > 1.8*  1.9*   < > 1.7*  1.7* 1.7* 1.7*  1.7* 1.6* 1.7* 1.7*   < > = values in this interval not displayed.   No results for input(s): "LIPASE", "AMYLASE" in the last 168 hours. No results for input(s): "AMMONIA" in the last 168 hours.  ABG    Component Value Date/Time   PHART 7.58 (H) 01/19/2024 0822   PCO2ART 30 (L) 01/19/2024 0822   PO2ART 64 (L) 01/19/2024 0822   HCO3 28.1 (H) 01/19/2024 0822   TCO2 31 01/18/2024 0927   ACIDBASEDEF 9.2 (H) 01/16/2024 1528   O2SAT 94.9 01/19/2024 0822     Coagulation Profile: Recent Labs  Lab 01/18/24 0230 01/18/24 0726 01/19/24 0541 01/20/24 0427 01/21/24 0504  INR 1.6* 1.4* 1.1 1.0 1.0    Cardiac Enzymes: No results for input(s): "CKTOTAL", "CKMB", "CKMBINDEX", "TROPONINI" in the last 168 hours.   HbA1C: Hgb A1c MFr Bld  Date/Time Value Ref Range Status  12/03/2023 02:37 PM 5.9 4.6 - 6.5 % Final    Comment:    Glycemic Control Guidelines for People with Diabetes:Non Diabetic:  <6%Goal of Therapy: <7%Additional Action Suggested:  >8%  01/22/2020 11:37 AM 5.2 4.8 - 5.6 % Final    Comment:             Prediabetes: 5.7 - 6.4          Diabetes: >6.4          Glycemic control for adults with diabetes: <7.0     CBG: Recent Labs  Lab 01/22/24 0623 01/22/24 1144 01/22/24 1753 01/22/24 2355 01/23/24 0620  GLUCAP 132* 132* 130* 129* 137*    CRITICAL CARE Performed by: Casimiro Cleaves   Total critical care time: 35  minutes  JD [facility] 01/23/2024, 7:06 AM  Please see [email or web address] for  pager details.  From 7A-7P if no response, please call [phone]. After hours, please call ELink [phone].

## 2024-01-23 NOTE — Progress Notes (Signed)
 Updated sister Adell Hones over phone.  JD Carliss Chess [facility] 01/23/2024, 2:51 PM  Please see [email or web address] for pager details.  From 7A-7P if no response, please call [phone]. After hours, please call ELink [phone].

## 2024-01-23 NOTE — Evaluation (Addendum)
 Occupational Therapy Evaluation Patient Details Name: Alexander [name] MRN: [id number] DOB: 16-Dec-1960 Today's Date: 01/23/2024   History of Present Illness   Hamlet Lasecki is a 63 yr old male who presented with rectal bleeding and abdominal pain on 01-11-2024. Found to have a perforated ascending colon with intra-abdominal abscess, and now s/p exploratory laparotomy, right colectomy without anastomosis, placement of wound VAC, and drainage of intra-abdominal abscesses. CRRT started 4/19- 01/23/24 .   PMH: PE/DVT on Coumadin , GERD, R ankle arthroplasty     Clinical Impressions The pt is currently presenting significantly below his baseline level of functioning for self-care management, currently requiring total assist for ADLs at bed level. He is limited by the below listed deficits (see OT problem list). During the session today, he was also noted to be with flat affect, reports of significant B LE pain, intermittently delayed initiation of tasks, gross weakness of B UE and B LE, and general debility. The bed was placed in the chair position to promote upright tolerance, and while pt was assisted with ROM and stretches. He appeared to have more weakness of his L UE and L LE, as compared to his R UE and R LE (nurse was made aware). Without further OT services, the pt is at risk for further functional decline and restricted ADL participation.      If plan is discharge home, recommend the following:   Two people to help with walking and/or transfers;Two people to help with bathing/dressing/bathroom;Direct supervision/assist for medications management;Assistance with feeding;Help with stairs or ramp for entrance     Functional Status Assessment   Patient has had a recent decline in their functional status and demonstrates the ability to make significant improvements in function in a reasonable and predictable amount of time.     Equipment Recommendations   Other (comment) (to be determined  pending functional progress)     Recommendations for Other Services         Precautions/Restrictions   Precautions Precautions: Fall Restrictions Weight Bearing Restrictions Per Provider Order: No Other Position/Activity Restrictions: rectal tube, urinary catheter, NG tube, L radial arterial line, R femoral hemo-dialysis catheter     Mobility Bed Mobility    General bed mobility comments: assisted in  bed chair position, patient worked to reach up to the rail, patient unable to  lean forward in bed        ADL either performed or assessed with clinical judgement   ADL Overall ADL's : Needs assistance/impaired Eating/Feeding: Total assistance;Bed level   Grooming: Total assistance;Bed level   Upper Body Bathing: Bed level;Total assistance   Lower Body Bathing: Total assistance;Bed level   Upper Body Dressing : Total assistance;Bed level   Lower Body Dressing: Total assistance;Bed level       Toileting- Clothing Manipulation and Hygiene: Total assistance;Bed level                            Pertinent Vitals/Pain Pain Assessment Pain Assessment: 0-10 Pain Score: 8  Pain Location: B LE Pain Intervention(s): Limited activity within patient's tolerance, Repositioned     Extremity/Trunk Assessment Upper Extremity Assessment Upper Extremity Assessment: Right hand dominant;Generalized weakness   Lower Extremity Assessment Lower Extremity Assessment: RLE deficits/detail;LLE deficits/detail RLE Deficits / Details: Required PROM to AAROM. Gross weakness LLE Deficits / Details: Required PROM. Gross weakness      Communication Communication Communication: Impaired Factors Affecting Communication: Reduced clarity of speech   Cognition Arousal: Alert Behavior During  Therapy: Flat affect        OT - Cognition Comments: Oriented to person, place, month, and year. Intermittently delayed initiation of tasks    Following commands impaired: Follows one  step commands with increased time, Follows one step commands inconsistently     Cueing  General Comments   Cueing Techniques: Verbal cues;Tactile cues;Visual cues  unable to balance. listing to the right  , at times able to self adjust to the  left near midline.   Exercises          Home Living Family/patient expects to be discharged to:: Private residence Living Arrangements: Alone Available Help at Discharge: Family Type of Home: House Home Access: Level entry     Home Layout: One level     Bathroom Shower/Tub: Tub/shower unit         Home Equipment: Jeananne Mighty - single point          Prior Functioning/Environment Prior Level of Function : Independent/Modified Independent             Mobility Comments: He used a cane for ambulation. ADLs Comments: Independent with ADLs, cooking, cleaning, and driving. He enjoys playing golf.    OT Problem List: Decreased strength;Decreased range of motion;Decreased activity tolerance;Impaired balance (sitting and/or standing);Decreased coordination;Decreased knowledge of use of DME or AE;Impaired UE functional use;Pain;Increased edema   OT Treatment/Interventions: Self-care/ADL training;Therapeutic exercise;Therapeutic activities;Energy conservation;Patient/family education;DME and/or AE instruction;Balance training      OT Goals(Current goals can be found in the care plan section)   Acute Rehab OT Goals Patient Stated Goal: "to get out of here." OT Goal Formulation: With patient Time For Goal Achievement: 02/06/24 Potential to Achieve Goals: Fair ADL Goals Pt Will Perform Eating: with min assist;with adaptive utensils;bed level Pt Will Perform Grooming: with min assist;bed level Pt Will Perform Upper Body Dressing: with min assist;bed level Additional ADL Goal #1: The pt will perform bed mobility with min assist, in prep for progressive ADL participation.   OT Frequency:  Min 2X/week    Co-evaluation PT/OT/SLP  Co-Evaluation/Treatment: Yes Reason for Co-Treatment: Complexity of the patient's impairments (multi-system involvement);For patient/therapist safety PT goals addressed during session: Mobility/safety with mobility OT goals addressed during session: ADL's and self-care      AM-PAC OT "6 Clicks" Daily Activity     Outcome Measure Help from another person eating meals?: Total Help from another person taking care of personal grooming?: Total Help from another person toileting, which includes using toliet, bedpan, or urinal?: Total Help from another person bathing (including washing, rinsing, drying)?: Total Help from another person to put on and taking off regular upper body clothing?: Total Help from another person to put on and taking off regular lower body clothing?: Total 6 Click Score: 6   End of Session Equipment Utilized During Treatment: Oxygen Nurse Communication: Mobility status  Activity Tolerance: Patient limited by fatigue Patient left: in bed;with call bell/phone within reach;with chair alarm set;with nursing/sitter in room  OT Visit Diagnosis: Muscle weakness (generalized) (M62.81);Feeding difficulties (R63.3);Pain Pain - part of body:  (B LE)                Time: 1430-1502 OT Time Calculation (min): 32 min Charges:  OT General Charges $OT Visit: 1 Visit OT Evaluation $OT Eval Moderate Complexity: 1 Mod    Dwayne Bulkley L Gregori Abril, OTR/L 01/23/2024, 4:24 PM

## 2024-01-23 NOTE — Progress Notes (Signed)
 PHARMACY - TOTAL PARENTERAL NUTRITION CONSULT NOTE   Indication:  intolerance to enteral nutrition  Patient Measurements: Height: 5\' 9"  (175.3 cm) Weight: 81.2 kg (179 lb 0.2 oz) IBW/kg (Calculated) : 70.7 TPN AdjBW (KG): 80.7 Body mass index is 26.44 kg/m.  Assessment: 63 year old male intubated and sedated in the ICU. S/p multiple surgical procedures as detailed below. Patient with brief cardiac arrest 4/16 requiring 4 vasopressors and stress dose steroids. His vasopressor requirements have decreased. Decline in renal function with minimal UOP started on CRRT 4/19. Pharmacy to start TPN as patient is currently not a candidate for enteral feeding yet.  Glucose / Insulin : no hx DM, Goal CBG <180 -Range: 132 - 154 (4 units SSI required) Electrolytes: CRRT 4/19 > stopping today -Phos WNL s/p repletion and addition to TPN.  -CorrCa (8.9) WNL (Pt has required aggressive Ca replacement during admission) -K on lower end of therapeutic range -Mg remains elevated, Na slightly low Renal: CRRT stopping today. Monitor for renal recovery, may require intermittent HD. Nephrology following.  Hepatic: LFTs continue to improve (shock liver). Albumin  low, Tbili elevated -TG 230 on 4/21 Intake / Output; MIVF:  -CRRT filtration per nephrology -Mostly anuric: UOP ~218 mL/24hr -NG output: 1790 mL, Stool: 480 mL -Drain output: 253 mL GI Imaging: 4/15 CT a/p 1. Persistent heterogeneous wall thickening of the right colon with surrounding inflammation and suspected enlarging contained perforation along the medial aspect of the ascending colon. As before, this may be secondary to diverticulitis, colitis or neoplasm. No organized fluid collection or pneumoperitoneum. General surgical consultation recommended. 2. Interval increased gastric distension with diffuse small and large bowel distension, most consistent with an ileus.  GI Surgeries / Procedures:  4/19 Re-exploration abdomen, small bowel resection,  creation of end ileostomy, abd closure and placement of wound vac 4/17 Re-exploration abdomen, small bowel resection 4/16 Right colectomy, drainage of abscess 4/19 ex lap, end ileostomy excision, wound vac  Central access: CVC TPN start date: 4/21  Nutritional Goals: -Concentrated TPN to attempt to reduce volume -TPN at goal rate of 90 mL/hr will provide 134 g protein, 2285 kcal  RD Assessment: Estimated Needs Total Energy Estimated Needs: 2250-2450 Total Protein Estimated Needs: 130-160g Total Fluid Estimated Needs: >/= 2L  Current Nutrition: NPO  Plan:  Now:  KCl 10 mEq IV x 2 doses Given the volume of NG output with K on lower end of range, will give some supplementation. Conservative replacement given renal function and potential for HD.   At 1800: Increase TPN to goal rate of 90 mL/hr Concentrated with Clinisol  15% to reduce total volume Electrolytes in TPN: Increase Na, Decrease Phos Na 150 mEq/L, Phos 10 mmol/L Other electrolytes removed due to renal function (CRRT stopping, potential for HD) Cl:Ac 1:1 Add standard MVI and trace elements to TPN Thiamine  100 mg IV daily x 5 days Increase sSSI q6h to moderate Monitor TPN labs on Mon/Thurs. Renal function panel + Mg ordered daily.   Shireen Dory, PharmD, BCPS Clinical Pharmacist 01/23/2024 7:38 AM

## 2024-01-23 NOTE — Evaluation (Signed)
 Physical Therapy Evaluation Patient Details Name: Alexander [name] MRN: [id number] DOB: 1960/11/26 Today's Date: 01/23/2024  History of Present Illness  Alexander [name] is a 63 y.o. male who .  He was admitted to [facility] on 01/11/2024 with rectal bleeding and hypotension.S/P exploratory laparotomy where he was found to have a perforated ascending colon with intra-abdominal abscess, right colectomy without anastomosis, placement of wound VAC, and drainage of intra-abdominal abscesses. CRRT started 4/19- 01/23/24 .   PMH: PE/DVT on Coumadin , GERD, R ankle arthroplasty  Clinical Impression  Pt admitted with above diagnosis.  Pt currently with functional limitations due to the deficits listed below (see PT Problem List). Pt will benefit from acute skilled PT to increase their independence and safety with mobility to allow discharge.     The patient is  alert, noted head rotated and  tilted to the  right. Patient  also presents with significant Left U and LE weakness compared to the right.   Placed patient in bed chair position for positioning and  ROM.  Patient may benefit from LTAC. Therapy is limited  by multiple lines and tubes and profound weakness, especially Left extremities.      If plan is discharge home, recommend the following: Two people to help with walking and/or transfers;Two people to help with bathing/dressing/bathroom   Can travel by private vehicle        Equipment Recommendations  (TBA)  Recommendations for Other Services       Functional Status Assessment Patient has had a recent decline in their functional status and demonstrates the ability to make significant improvements in function in a reasonable and predictable amount of time.     Precautions / Restrictions Precautions Precautions: Fall Recall of Precautions/Restrictions: Impaired Precaution/Restrictions Comments: NG , Left radial art line, Right central line, R femoral HD catheter Restrictions Weight Bearing Restrictions  Per Provider Order: No      Mobility  Bed Mobility               General bed mobility comments: assisted in  bed chair position, patient worked to reach up to the rail, patient unable to  lean forward in bed    Transfers                        Ambulation/Gait                  Stairs            Wheelchair Mobility     Tilt Bed    Modified Rankin (Stroke Patients Only)       Balance                                             Pertinent Vitals/Pain Pain Assessment Pain Assessment: Faces Pain Location: abdomen Pain Descriptors / Indicators: Grimacing Pain Intervention(s): Monitored during session, Premedicated before session    Home Living Family/patient expects to be discharged to:: Private residence Living Arrangements: Alone Available Help at Discharge: Family Type of Home: House Home Access: Level entry       Home Layout: One level Home Equipment: Jeananne Mighty - single point      Prior Function Prior Level of Function : Independent/Modified Independent                     Extremity/Trunk Assessment  Lower Extremity Assessment Lower Extremity Assessment: RLE deficits/detail;LLE deficits/detail RLE Deficits / Details: limited ankle  movement, noted tightness of the knee, lacks full extension, hip flex 1+/5, IR 2/5 LLE Deficits / Details: trace toe   movement, IR 1/5, Hip flex 1/5    Cervical / Trunk Assessment Cervical / Trunk Assessment: Other exceptions Cervical / Trunk Exceptions: head rotated  and side  bends posture, able to rtate head  but stays tilte to the right.  Communication   Communication Communication: Impaired Factors Affecting Communication: Difficulty expressing self;Reduced clarity of speech    Cognition Arousal: Alert Behavior During Therapy: Flat affect   PT - Cognitive impairments: Orientation, Awareness, Attention   Orientation impairments: Place, Time, Situation                      Following commands: Impaired Following commands impaired: Follows one step commands inconsistently     Cueing Cueing Techniques: Verbal cues, Tactile cues, Visual cues, Gestural cues     General Comments General comments (skin integrity, edema, etc.): unable to balance. listing to the right  , at times able to self adjust to the  left near midline.    Exercises     Assessment/Plan    PT Assessment Patient needs continued PT services  PT Problem List Decreased strength;Decreased coordination;Decreased range of motion;Decreased cognition;Decreased activity tolerance;Decreased balance;Decreased mobility;Decreased knowledge of precautions;Decreased skin integrity;Decreased safety awareness;Impaired tone       PT Treatment Interventions Therapeutic exercise;Gait training;Balance training;Neuromuscular re-education;Functional mobility training;Therapeutic activities;Patient/family education;Cognitive remediation    PT Goals (Current goals can be found in the Care Plan section)  Acute Rehab PT Goals Patient Stated Goal: agreed to  therapy PT Goal Formulation: With patient Time For Goal Achievement: 02/06/24 Potential to Achieve Goals: Fair    Frequency Min 2X/week     Co-evaluation PT/OT/SLP Co-Evaluation/Treatment: Yes Reason for Co-Treatment: Complexity of the patient's impairments (multi-system involvement);For patient/therapist safety PT goals addressed during session: Mobility/safety with mobility OT goals addressed during session: ADL's and self-care       AM-PAC PT "6 Clicks" Mobility  Outcome Measure Help needed turning from your back to your side while in a flat bed without using bedrails?: Total Help needed moving from lying on your back to sitting on the side of a flat bed without using bedrails?: Total Help needed moving to and from a bed to a chair (including a wheelchair)?: Total Help needed standing up from a chair using your arms  (e.g., wheelchair or bedside chair)?: Total Help needed to walk in hospital room?: Total Help needed climbing 3-5 steps with a railing? : Total 6 Click Score: 6    End of Session   Activity Tolerance: Patient tolerated treatment well Patient left: in bed;with call bell/phone within reach;with bed alarm set;with nursing/sitter in room Nurse Communication: Mobility status PT Visit Diagnosis: Unsteadiness on feet (R26.81);Muscle weakness (generalized) (M62.81);Other symptoms and signs involving the nervous system (R29.898);Difficulty in walking, not elsewhere classified (R26.2)    Time: 1430-1500 PT Time Calculation (min) (ACUTE ONLY): 30 min   Charges:   PT Evaluation $PT Eval Low Complexity: 1 Low   PT General Charges $$ ACUTE PT VISIT: 1 Visit         Abelina Hoes PT Acute Rehabilitation Services Office [phone] Weekend [phone]   Dareen Ebbing 01/23/2024, 3:42 PM

## 2024-01-23 NOTE — Progress Notes (Signed)
 Nutrition Follow-up  DOCUMENTATION CODES:   Not applicable  INTERVENTION:  - Plan to continue TPN.             - TPN management per pharmacy.   - Monitor magnesium, potassium, and phosphorus BID for at least 3 days, MD to replete as needed, as pt is at risk for refeeding syndrome given minimal/no nutrition intake for at least 9 days. -Add 100mg  Thiamine  x5 days.   - Daily weights while on TPN.   NUTRITION DIAGNOSIS:   Inadequate oral intake related to inability to eat as evidenced by NPO status. *ongoing  GOAL:   Patient will meet greater than or equal to 90% of their needs *progressing, on TPN  MONITOR:   Vent status, Labs, Weight trends  REASON FOR ASSESSMENT:   Consult New TPN/TNA  ASSESSMENT:   63 y.o. male who has a PMH including HTN, PAD, diverticulitis who was admitted with rectal bleeding and hypotension.  4/12 Admit 4/13 CLD 4/15 CT a/p showing suspicion for contained colon perforation and ileus 4/16 OR for ex-lap, right colectomy, wound vac placement; returned to ICU intubated 4/17 OR for re-exploration of abdomen, wound vac placement, small bowel resection, left in discontinuity  4/19 s/p ex lap, ileostomy creation, SB resection, Wound VAC; CRRT initiated 4/21 TPN Initiated  4/23 Extubated, NGT placed for suction 4/24 Stopping CRRT  Patient in bed at time of visit, in mitts with NGT in place.  Discussed TPN and nutrition and patient nodded his head in apparent understanding.   Per RN at bedside, patient having a lot of output from NGT. Had out from 7p-7a last night per I&O's report.  Per CCS note today, plan to continue TPN until return of bowel function. CCS anticipating prolonged ileus and high ostomy output.  Per nephrology, stopping CRRT today. Adjusted estimated needs as below.  Per discussion with pharmacy, TPN already ordered before 9am today so unable to make changes. Plan to adjust TPN back down tomorrow.   Admission weight:  177# Current weight: 179# I&O's: +17.6L since admit + for moderate pitting generalized edema and deep pitting RLE/LLE edema   Medications: 100mg  thiamine   Labs reviewed: Na 134 Creatinine 1.94 Magnesium 2.5 HA1C 5.9 Triglycerides 230 (as of 4/21)   Diet Order:   Diet Order             Diet NPO time specified  Diet effective now                   EDUCATION NEEDS:  Education needs have been addressed  Skin:  Skin Assessment: Skin Integrity Issues: Skin Integrity Issues:: Incisions, Other (Comment) Incisions: Abdomen Other: Several skin tears  Last BM:  4/22  Height:  Ht Readings from Last 1 Encounters:  01/20/24 5\' 9"  (1.753 m)   Weight:  Wt Readings from Last 1 Encounters:  01/23/24 81.2 kg   Ideal Body Weight:  72.73 kg  BMI:  Body mass index is 26.44 kg/m.  Estimated Nutritional Needs:  Kcal:  2000-2250 kcals Protein:  100-115 grams Fluid:  >/= 2L    Scheryl Cushing RD, LDN Contact via Secure Chat.
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
## 2024-01-23 NOTE — Progress Notes (Signed)
 Patient ID: Alexander [name], male   DOB: Oct 04, 1960, 63 y.o.   MRN: [id number]   Follow up - Emergency General Surgery  Patient Details:    Alexander [name] is an 63 y.o. male.  Lines/tubes : Airway 7.5 mm (Active)  Secured at (cm) 23 cm 01/19/24 0329  Measured From Lips 01/19/24 0329  Secured Location Right 01/19/24 0329  Secured By [employer] 01/19/24 0329  Bite Block No 01/19/24 0329  Tube Holder Repositioned Yes 01/19/24 0329  Prone position No 01/19/24 0329  Head position Right 01/18/24 2000  Cuff Pressure (cm H2O) Clear OR 27-39 CmH2O 01/19/24 0329  Site Condition Dry 01/19/24 0329     CVC Double Lumen 01/15/24 Right Internal jugular 16 cm (Active)  Indication for Insertion or Continuance of Line Vasoactive infusions 01/19/24 0400  Site Assessment Clean, Dry, Intact 01/19/24 0400  Proximal Lumen Status Infusing 01/19/24 0400  Distal Lumen Status Infusing 01/19/24 0400  Dressing Type Transparent 01/19/24 0400  Dressing Status Antimicrobial disc/dressing in place;Clean;Dry 01/19/24 0400  Line Care Connections checked and tightened 01/19/24 0400  Dressing Intervention Dressing changed 01/19/24 0545  Dressing Change Due 01/26/24 01/19/24 0545     Arterial Line 01/15/24 Left Radial (Active)  Site Assessment Clean, Dry, Intact 01/19/24 0400  Line Status Pulsatile blood flow 01/19/24 0400  Art Line Waveform Appropriate 01/19/24 0400  Art Line Interventions Connections checked and tightened;Flushed per protocol;Line pulled back 01/19/24 0400  Color/Movement/Sensation Capillary refill less than 3 sec 01/19/24 0400  Dressing Type Transparent 01/19/24 0400  Dressing Status Clean, Dry, Intact;Antimicrobial disc in place 01/19/24 0400  Interventions Other (Comment) 01/19/24 0400  Dressing Change Due 01/22/24 01/19/24 0400     Closed System Drain LLQ Bulb (JP) 19 Fr. (Active)  Site Description Unable to view 01/18/24 2000  Dressing Status Clean, Dry, Intact 01/18/24 2000   Drainage Appearance Bright red;Serosanguineous 01/18/24 2000  Status To gravity (Uncharged) 01/18/24 2000  Output (mL) 20 mL 01/19/24 0800     Negative Pressure Wound Therapy Abdomen Medial;Upper (Active)  Site / Wound Assessment Dressing in place / Unable to assess 01/18/24 2000  Wound filler - Black foam 1 01/18/24 0900  Target Pressure (mmHg) 125 01/18/24 2000  Dressing Status Intact 01/18/24 2000  Drainage Amount Minimal 01/18/24 2000  Drainage Description Sanguineous 01/18/24 2000  Output (mL) 0 mL 01/19/24 0800     NG/OG Vented/Dual Lumen 18 Fr. Oral (Active)  Tube Position (Required) Marking at nare/corner of mouth 01/19/24 0745  Measurement (cm) (Required) 60 cm 01/18/24 2000  Ongoing Placement Verification (Required) (See row information) Yes 01/19/24 0745  Site Assessment Clean, Dry, Intact 01/19/24 0745  Interventions Other (Comment) 01/17/24 0515  Status Low intermittent suction 01/19/24 0745  Amount of suction 55 mmHg 01/18/24 2000  Drainage Appearance Bile;Brown 01/19/24 0745  Intake (mL) 60 mL 01/18/24 0020  Output (mL) 28 mL 01/19/24 0800     Ileostomy Standard (end) RUQ (Active)  Ostomy Pouch 1 piece 01/19/24 0745  Stoma Assessment Red 01/19/24 0745  Peristomal Assessment Intact 01/19/24 0745  Output (mL) 0 mL 01/19/24 0800     Urethral Catheter JAMIE BANKS RN Latex 14 Fr. (Active)  Indication for Insertion or Continuance of Catheter Therapy based on hourly urine output monitoring and documentation for critical condition (NOT STRICT I&O) 01/19/24 0745  Site Assessment Clean, Dry, Intact;Swelling 01/19/24 0745  Catheter Maintenance Bag below level of bladder;Catheter secured;Drainage bag/tubing not touching floor;Insertion date on drainage bag;No dependent loops;Seal intact 01/19/24 0745  Collection Container Standard  drainage bag 01/19/24 0745  Securement Method Adhesive securement device 01/19/24 0745  Urinary Catheter Interventions (if applicable) Unclamped  01/18/24 2000  Input (mL) 10 mL 01/16/24 2047  Output (mL) 11 mL 01/19/24 0800     Fecal Management System 30 mL (Active)  Does patient meet criteria for removal? No 01/19/24 0745  Daily care Skin around tube assessed;Assess location of position indicator line;Bag changed (every 24 hours) 01/19/24 0745  Patient Indicator Assessment Green 01/19/24 0745  Bulb Deflated and Reinflated Yes 01/19/24 0745  Amount in bulb 35 mL 01/19/24 0745  Output (mL) 0 mL 01/19/24 0800    Microbiology/Sepsis markers: Results for orders placed or performed during the hospital encounter of 01/11/24  Culture, blood (single)     Status: None   Collection Time: 01/11/24  7:57 PM   Specimen: BLOOD  Result Value Ref Range Status   Specimen Description   Final    BLOOD BLOOD RIGHT ARM Performed at [facility], [address] [place], [place] [address]    Special Requests   Final    BOTTLES DRAWN AEROBIC AND ANAEROBIC Blood Culture adequate volume Performed at [facility], [address], [place], [place] [address]    Culture  Setup Time   Final    GRAM POSITIVE RODS ANAEROBIC BOTTLE ONLY CRITICAL RESULT CALLED TO, READ BACK BY AND VERIFIED WITH: PHARMD E JACKSON 01/16/2024 @ 0350 BY AB    Culture   Final    EGGERTHELLA SPP Standardized susceptibility testing for this organism is not available. Performed at [facility], [address], [place], [place] [address]    Report Status 01/21/2024 FINAL  Final  MRSA Next Gen by PCR, Nasal     Status: Abnormal   Collection Time: 01/12/24 12:06 AM   Specimen: Nasal Mucosa; Nasal Swab  Result Value Ref Range Status   MRSA by PCR Next Gen DETECTED (A) NOT DETECTED Corrected    Comment: CRITICAL RESULT CALLED TO, READ BACK BY AND VERIFIED WITH: Godwin Lat, RN [phone] 01/12/24 MH (NOTE) The GeneXpert MRSA Assay (FDA approved for NASAL specimens only), is one component of a comprehensive MRSA colonization  surveillance program. It is not intended to diagnose MRSA infection nor to guide or monitor treatment for MRSA infections. Test performance is not FDA approved in patients less than 49 years old. Performed at [facility], [address] [place], [place] [address] CORRECTED ON 04/13 AT 0255: PREVIOUSLY REPORTED AS DETECTED CRITICAL RESULT CALLED TO, READ BACK BY AND VERIFIED WITH: Godwin Lat, RN [phone] 01/12/24 MH   Resp panel by RT-PCR (RSV, Flu A&B, Covid) Anterior Nasal Swab     Status: None   Collection Time: 01/12/24  9:53 AM   Specimen: Anterior Nasal Swab  Result Value Ref Range Status   SARS Coronavirus 2 by RT PCR NEGATIVE NEGATIVE Final    Comment: (NOTE) SARS-CoV-2 target nucleic acids are NOT DETECTED.  The SARS-CoV-2 RNA is generally detectable in upper respiratory specimens during the acute phase of infection. The lowest concentration of SARS-CoV-2 viral copies this assay can detect is 138 copies/mL. A negative result does not preclude SARS-Cov-2 infection and should not be used as the sole basis for treatment or other patient management decisions. A negative result may occur with  improper specimen collection/handling, submission of specimen other than nasopharyngeal swab, presence of viral mutation(s) within the areas targeted by this assay, and inadequate number of viral copies(<138 copies/mL). A negative result must  be combined with clinical observations, patient history, and epidemiological information. The expected result is Negative.  Fact Sheet for Patients:  BloggerCourse.com  Fact Sheet for Healthcare Providers:  SeriousBroker.it  This test is no t yet approved or cleared by the [place]  FDA and  has been authorized for detection and/or diagnosis of SARS-CoV-2 by FDA under an Emergency Use Authorization (EUA). This EUA will remain  in effect (meaning this test can be used) for the  duration of the COVID-19 declaration under Section 564(b)(1) of the Act, 21 U.S.C.section 360bbb-3(b)(1), unless the authorization is terminated  or revoked sooner.       Influenza A by PCR NEGATIVE NEGATIVE Final   Influenza B by PCR NEGATIVE NEGATIVE Final    Comment: (NOTE) The Xpert Xpress SARS-CoV-2/FLU/RSV plus assay is intended as an aid in the diagnosis of influenza from Nasopharyngeal swab specimens and should not be used as a sole basis for treatment. Nasal washings and aspirates are unacceptable for Xpert Xpress SARS-CoV-2/FLU/RSV testing.  Fact Sheet for Patients: BloggerCourse.com  Fact Sheet for Healthcare Providers: SeriousBroker.it  This test is not yet approved or cleared by the [place]  FDA and has been authorized for detection and/or diagnosis of SARS-CoV-2 by FDA under an Emergency Use Authorization (EUA). This EUA will remain in effect (meaning this test can be used) for the duration of the COVID-19 declaration under Section 564(b)(1) of the Act, 21 U.S.C. section 360bbb-3(b)(1), unless the authorization is terminated or revoked.     Resp Syncytial Virus by PCR NEGATIVE NEGATIVE Final    Comment: (NOTE) Fact Sheet for Patients: BloggerCourse.com  Fact Sheet for Healthcare Providers: SeriousBroker.it  This test is not yet approved or cleared by the [place]  FDA and has been authorized for detection and/or diagnosis of SARS-CoV-2 by FDA under an Emergency Use Authorization (EUA). This EUA will remain in effect (meaning this test can be used) for the duration of the COVID-19 declaration under Section 564(b)(1) of the Act, 21 U.S.C. section 360bbb-3(b)(1), unless the authorization is terminated or revoked.  Performed at [facility], [address], [place], [place] [address]   Culture, blood (Routine X 2) w Reflex to ID  Panel     Status: None   Collection Time: 01/13/24  7:16 AM   Specimen: BLOOD LEFT HAND  Result Value Ref Range Status   Specimen Description   Final    BLOOD LEFT HAND Performed at [facility], [address] [place], [place] [address]    Special Requests   Final    BOTTLES DRAWN AEROBIC AND ANAEROBIC Blood Culture results may not be optimal due to an inadequate volume of blood received in culture bottles Performed at [facility], [address], [place], [place] [address]    Culture   Final    NO GROWTH 5 DAYS Performed at [facility], [address] [place], [place] [address]    Report Status 01/18/2024 FINAL  Final  Culture, blood (Routine X 2) w Reflex to ID Panel     Status: None   Collection Time: 01/13/24  7:16 AM   Specimen: BLOOD RIGHT ARM  Result Value Ref Range Status   Specimen Description   Final    BLOOD RIGHT ARM Performed at [facility], [address], [place], [place] [address]    Special Requests   Final    BOTTLES DRAWN AEROBIC AND ANAEROBIC Blood Culture results may not be optimal due to  an inadequate volume of blood received in culture bottles Performed at [facility], [address], [place], [place] [address]    Culture   Final    NO GROWTH 5 DAYS Performed at [facility], [address] [place], [place] [address]    Report Status 01/18/2024 FINAL  Final    Anti-infectives:  Anti-infectives (From admission, onward)    Start     Dose/Rate Route Frequency Ordered Stop   01/18/24 1400  piperacillin -tazobactam (ZOSYN ) IVPB 3.375 g  Status:  Discontinued        3.375 g 12.5 mL/hr over 240 Minutes Intravenous Every 6 hours 01/18/24 1241 01/18/24 1242   01/18/24 1400  piperacillin -tazobactam (ZOSYN ) IVPB 3.375 g        3.375 g 100 mL/hr over 30 Minutes Intravenous Every 6 hours 01/18/24 1242 01/23/24 2359   01/18/24 0000  piperacillin -tazobactam (ZOSYN ) IVPB 2.25 g  Status:   Discontinued        2.25 g 100 mL/hr over 30 Minutes Intravenous Every 6 hours 01/17/24 2120 01/18/24 1241   01/12/24 0200  piperacillin -tazobactam (ZOSYN ) IVPB 3.375 g  Status:  Discontinued        3.375 g 12.5 mL/hr over 240 Minutes Intravenous Every 8 hours 01/11/24 2120 01/17/24 2120   01/11/24 1815  piperacillin -tazobactam (ZOSYN ) IVPB 3.375 g        3.375 g 100 mL/hr over 30 Minutes Intravenous  Once 01/11/24 1806 01/11/24 1921       Best Practice/Protocols:  VTE Prophylaxis: Mechanical GI Prophylaxis: Proton Pump Inhibitor Continous Sedation  Consults: Treatment Team:  Ccs, Md, MD Chucky Craver, MD    Studies:    Events:  Subjective:    Overnight Issues:  Got 1u prbc for hgb 6.9 overnight; pressor requirement down started on CRRT yesterday Objective:  Vital signs for last 24 hours: Temp:  [98.3 F (36.8 C)-99.4 F (37.4 C)] 98.8 F (37.1 C) (04/24 0800) Pulse Rate:  [87-99] 87 (04/24 1130) Resp:  [11-23] 17 (04/24 1130) SpO2:  [97 %-100 %] 100 % (04/24 1130) Arterial Line BP: (133-184)/(41-66) 169/53 (04/24 1130) Weight:  [81.2 kg] 81.2 kg (04/24 0500)  Hemodynamic parameters for last 24 hours:    Intake/Output from previous day: 04/23 0701 - 04/24 0700 In: 2520.6 [I.V.:1767.8; IV Piggyback:459.8] Out: 4768 [Urine:218; Emesis/NG output:1790; Drains:253; Stool:480]  Intake/Output this shift: Total I/O In: 434.2 [I.V.:284.2; IV Piggyback:150] Out: 368 [Urine:10; Emesis/NG output:250; Drains:20; Stool:88]  Vent settings for last 24 hours:    Physical Exam:  Intubated Soft, some distension JP - a little bright red blood Soft tissue wound vac secure Ileostomy -edematous/viable; some liquid stool in bag B/l LE edema Arouses to voice  Results for orders placed or performed during the hospital encounter of 01/11/24 (from the past 24 hours)  Glucose, capillary     Status: Abnormal   Collection Time: 01/22/24 11:44 AM  Result Value Ref Range    Glucose-Capillary 132 (H) 70 - 99 mg/dL   Comment 1 Notify RN    Comment 2 Document in Chart   Renal function panel (daily at 1600)     Status: Abnormal   Collection Time: 01/22/24  3:57 PM  Result Value Ref Range   Sodium 138 135 - 145 mmol/L   Potassium 3.8 3.5 - 5.1 mmol/L   Chloride 104 98 - 111 mmol/L   CO2 26 22 - 32 mmol/L   Glucose, Bld 136 (H) 70 - 99 mg/dL   BUN 33 (H)  8 - 23 mg/dL   Creatinine, Ser 9.62 (H) 0.61 - 1.24 mg/dL   Calcium  7.2 (L) 8.9 - 10.3 mg/dL   Phosphorus 3.2 2.5 - 4.6 mg/dL   Albumin  1.6 (L) 3.5 - 5.0 g/dL   GFR, Estimated 35 (L) >60 mL/min   Anion gap 8 5 - 15  Glucose, capillary     Status: Abnormal   Collection Time: 01/22/24  5:53 PM  Result Value Ref Range   Glucose-Capillary 130 (H) 70 - 99 mg/dL   Comment 1 Notify RN    Comment 2 Document in Chart   Glucose, capillary     Status: Abnormal   Collection Time: 01/22/24 11:55 PM  Result Value Ref Range   Glucose-Capillary 129 (H) 70 - 99 mg/dL  Prealbumin     Status: Abnormal   Collection Time: 01/23/24 12:00 AM  Result Value Ref Range   Prealbumin 8 (L) 18 - 38 mg/dL  Renal function panel (daily at 0500)     Status: Abnormal   Collection Time: 01/23/24  5:03 AM  Result Value Ref Range   Sodium 135 135 - 145 mmol/L   Potassium 3.6 3.5 - 5.1 mmol/L   Chloride 104 98 - 111 mmol/L   CO2 24 22 - 32 mmol/L   Glucose, Bld 155 (H) 70 - 99 mg/dL   BUN 32 (H) 8 - 23 mg/dL   Creatinine, Ser 9.52 (H) 0.61 - 1.24 mg/dL   Calcium  7.2 (L) 8.9 - 10.3 mg/dL   Phosphorus 2.6 2.5 - 4.6 mg/dL   Albumin  1.7 (L) 3.5 - 5.0 g/dL   GFR, Estimated 40 (L) >60 mL/min   Anion gap 7 5 - 15  Magnesium      Status: Abnormal   Collection Time: 01/23/24  5:05 AM  Result Value Ref Range   Magnesium  2.5 (H) 1.7 - 2.4 mg/dL  APTT     Status: Abnormal   Collection Time: 01/23/24  5:05 AM  Result Value Ref Range   aPTT 46 (H) 24 - 36 seconds  Phosphorus     Status: None   Collection Time: 01/23/24  5:05 AM  Result  Value Ref Range   Phosphorus 2.7 2.5 - 4.6 mg/dL  CBC     Status: Abnormal   Collection Time: 01/23/24  5:05 AM  Result Value Ref Range   WBC 18.5 (H) 4.0 - 10.5 K/uL   RBC 3.02 (L) 4.22 - 5.81 MIL/uL   Hemoglobin 9.0 (L) 13.0 - 17.0 g/dL   HCT 84.1 (L) 32.4 - 40.1 %   MCV 94.4 80.0 - 100.0 fL   MCH 29.8 26.0 - 34.0 pg   MCHC 31.6 30.0 - 36.0 g/dL   RDW 02.7 (H) 25.3 - 66.4 %   Platelets 227 150 - 400 K/uL   nRBC 0.0 0.0 - 0.2 %  Comprehensive metabolic panel     Status: Abnormal   Collection Time: 01/23/24  5:05 AM  Result Value Ref Range   Sodium 134 (L) 135 - 145 mmol/L   Potassium 3.6 3.5 - 5.1 mmol/L   Chloride 102 98 - 111 mmol/L   CO2 24 22 - 32 mmol/L   Glucose, Bld 154 (H) 70 - 99 mg/dL   BUN 32 (H) 8 - 23 mg/dL   Creatinine, Ser 4.03 (H) 0.61 - 1.24 mg/dL   Calcium  7.1 (L) 8.9 - 10.3 mg/dL   Total Protein 5.4 (L) 6.5 - 8.1 g/dL   Albumin  1.7 (L) 3.5 - 5.0 g/dL  AST 57 (H) 15 - 41 U/L   ALT 179 (H) 0 - 44 U/L   Alkaline Phosphatase 69 38 - 126 U/L   Total Bilirubin 2.2 (H) 0.0 - 1.2 mg/dL   GFR, Estimated 39 (L) >60 mL/min   Anion gap 8 5 - 15  Glucose, capillary     Status: Abnormal   Collection Time: 01/23/24  6:20 AM  Result Value Ref Range   Glucose-Capillary 137 (H) 70 - 99 mg/dL  Glucose, capillary     Status: Abnormal   Collection Time: 01/23/24 11:25 AM  Result Value Ref Range   Glucose-Capillary 128 (H) 70 - 99 mg/dL   Comment 1 Notify RN    Comment 2 Document in Chart       Latest Ref Rng & Units 01/23/2024    5:05 AM 01/22/2024   10:33 AM 01/21/2024    5:04 AM  CBC  WBC 4.0 - 10.5 K/uL 18.5  15.8    Hemoglobin 13.0 - 17.0 g/dL 9.0  9.3    Hematocrit 39.0 - 52.0 % 28.5  30.5    Platelets 150 - 400 K/uL 227  139  93      Assessment & Plan: Present on Admission:  (Resolved) Severe sepsis (HCC)  Essential hypertension, benign  History of pulmonary embolism  Diverticulitis  AKI (acute kidney injury) (HCC)  Hyperlipidemia  Spontaneous  perforation of colon (HCC)  Ascites  History of pulmonary embolus (PE)  Pulmonary hypertension (HCC)  Cor pulmonale (HCC)  Peripheral arterial disease (HCC)  LAPAROTOMY, EXPLORATORY RIGHT COLECTOMY (without anastomosis) PLACEMENT OF ABTHERA WOUND VAC DRAINAGE OF INTRA-ABDOMINAL ABSCESS  4/16 EW   RE-EXPLORATION ABDOMEN, PLACEMENT OF ABTHERA WOUND VAC, SMALL BOWEL RESECTION 01/16/2024 EW  Re-exploration abdomen, small bowel resection, creation of end ileostomy, abd closure, placement wound vac 4/19 EW   FEN - NPO, TPN till return of bowel function, anticipate prolonged ileus and high ostomy output   Septic shock - improved - per CCM ABL anemia - stabilized monitor and transfuse as needed(13 u prbc total, 1u cryo, 6u FFP, 1 plt) AKI - started CRRT 4/19,  defer to CCM/renal ID - cont broad spectrum IV abx; WBC going back up some today, may need scan to evaluate for undrained fluid collections if ileus and leukocytosis persist.  Acute hypoxemic resp failure - per CCM  H/o VTE/PE/DVT - scds, okay to try heparin  again at this point  Dispo: ICU   LOS: 12 days     01/23/2024   Junie Olds, MD  [facility]

## 2024-01-23 NOTE — Consult Note (Signed)
 WOC Nurse Consult Note:  Full thickness midline abd post-op wound is beefy red, scant amt pink drainage. Pt was medicated for pain prior to the procedure and tolerated mod amt discomfort as evidenced by grimacing.  Applied one piece black foam to cont suction.  WOC will plan to change again on Mon.   WOC Nurse ostomy consult note Ileostomy stoma is red and viable, 1 3/8 inches, slightly above skin level.  50cc brown liquid in the pouch. Applied barrier ring and one piece flexible convex pouch.  4 sets of each supply in room for staff nurse's use.  Use supplies: barrier ring Timm Foot # [id number] and convex pouch Lawson # [id number]. Educational materials at the bedside. Pt is critically ill in ICU and no family members are present.  WOC team will begin teaching sessions when he is stable and out of ICU.  Enrolled patient in [employer] DC program: Yes; previously. Thank-you,  Wiliam Harder MSN, RN, CWOCN, [place], CNS [phone]

## 2024-01-24 ENCOUNTER — Inpatient Hospital Stay (HOSPITAL_COMMUNITY)

## 2024-01-24 DIAGNOSIS — K631 Perforation of intestine (nontraumatic): Secondary | ICD-10-CM | POA: Diagnosis not present

## 2024-01-24 LAB — CBC
HCT: 27.5 % — ABNORMAL LOW (ref 39.0–52.0)
Hemoglobin: 8.5 g/dL — ABNORMAL LOW (ref 13.0–17.0)
MCH: 29.5 pg (ref 26.0–34.0)
MCHC: 30.9 g/dL (ref 30.0–36.0)
MCV: 95.5 fL (ref 80.0–100.0)
Platelets: 405 10*3/uL — ABNORMAL HIGH (ref 150–400)
RBC: 2.88 MIL/uL — ABNORMAL LOW (ref 4.22–5.81)
RDW: 19.5 % — ABNORMAL HIGH (ref 11.5–15.5)
WBC: 23.9 10*3/uL — ABNORMAL HIGH (ref 4.0–10.5)
nRBC: 0 % (ref 0.0–0.2)

## 2024-01-24 LAB — BPAM FFP
Blood Product Expiration Date: 202504242359
Blood Product Expiration Date: 202504242359
ISSUE DATE / TIME: 202504190749
ISSUE DATE / TIME: 202504190749
Unit Type and Rh: 8400
Unit Type and Rh: 8400

## 2024-01-24 LAB — GLUCOSE, CAPILLARY
Glucose-Capillary: 127 mg/dL — ABNORMAL HIGH (ref 70–99)
Glucose-Capillary: 116 mg/dL — ABNORMAL HIGH (ref 70–99)
Glucose-Capillary: 111 mg/dL — ABNORMAL HIGH (ref 70–99)

## 2024-01-24 LAB — HEPATIC FUNCTION PANEL
ALT: 118 U/L — ABNORMAL HIGH (ref 0–44)
AST: 41 U/L (ref 15–41)
Albumin: 1.6 g/dL — ABNORMAL LOW (ref 3.5–5.0)
Alkaline Phosphatase: 63 U/L (ref 38–126)
Bilirubin, Direct: 1 mg/dL — ABNORMAL HIGH (ref 0.0–0.2)
Indirect Bilirubin: 0.8 mg/dL (ref 0.3–0.9)
Total Bilirubin: 1.8 mg/dL — ABNORMAL HIGH (ref 0.0–1.2)
Total Protein: 5.4 g/dL — ABNORMAL LOW (ref 6.5–8.1)

## 2024-01-24 LAB — PREPARE FRESH FROZEN PLASMA: Unit division: 0

## 2024-01-24 LAB — RENAL FUNCTION PANEL
Albumin: 1.5 g/dL — ABNORMAL LOW (ref 3.5–5.0)
Anion gap: 10 (ref 5–15)
BUN: 57 mg/dL — ABNORMAL HIGH (ref 8–23)
CO2: 25 mmol/L (ref 22–32)
Calcium: 7.4 mg/dL — ABNORMAL LOW (ref 8.9–10.3)
Chloride: 103 mmol/L (ref 98–111)
Creatinine, Ser: 3.89 mg/dL — ABNORMAL HIGH (ref 0.61–1.24)
GFR, Estimated: 17 mL/min — ABNORMAL LOW (ref >60)
Glucose, Bld: 144 mg/dL — ABNORMAL HIGH (ref 70–99)
Phosphorus: 4.5 mg/dL (ref 2.5–4.6)
Potassium: 3.5 mmol/L (ref 3.5–5.1)
Sodium: 138 mmol/L (ref 135–145)

## 2024-01-24 LAB — PROTIME-INR
INR: 1.1 (ref 0.8–1.2)
Prothrombin Time: 14.8 s (ref 11.4–15.2)

## 2024-01-24 LAB — HEPARIN LEVEL (UNFRACTIONATED)
Heparin Unfractionated: <0.1 [IU]/mL — ABNORMAL LOW (ref 0.30–0.70)
Heparin Unfractionated: <0.1 [IU]/mL — ABNORMAL LOW (ref 0.30–0.70)

## 2024-01-24 LAB — MAGNESIUM: Magnesium: 2.3 mg/dL (ref 1.7–2.4)

## 2024-01-24 MED ORDER — ORAL CARE MOUTH RINSE: 15.0000 mL | OROMUCOSAL | Status: DC | PRN

## 2024-01-24 MED ORDER — TRACE MINERALS CU-MN-SE-ZN 300-55-60-3000 MCG/ML IV SOLN
INTRAVENOUS | Status: AC
  Filled 2024-01-24: qty 720

## 2024-01-24 MED ORDER — POTASSIUM CHLORIDE 10 MEQ/100ML IV SOLN
10.0000 meq | INTRAVENOUS | Status: AC
  Administered 2024-01-24 (×2): 10 meq via INTRAVENOUS
  Filled 2024-01-24 (×2): qty 100

## 2024-01-24 MED ORDER — IOHEXOL 9 MG/ML PO SOLN
500.0000 mL | ORAL | Status: AC
  Administered 2024-01-24 (×2): 500 mL via ORAL

## 2024-01-24 MED ORDER — IOHEXOL 9 MG/ML PO SOLN
ORAL | Status: AC
  Filled 2024-01-24: qty 1000
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
## 2024-01-24 NOTE — TOC Progression Note (Addendum)
 Transition of Care [facility]) - Progression Note    Patient Details  Name: Alexander [name] MRN: [id number] Date of Birth: [birth year]  Transition of Care [facility]) CM/SW Contact  Bari Leys, RN Phone Number: 01/24/2024, 10:44 AM  Clinical Narrative:  Visited patient's room, pt was resting. NCM called to pt's sister, Alexander [name], introduced role of TOC/NCM and re reviewed for dc planning, PT recommendation for LTAC. Alexander [name] states she received and update from one of patient's physicians with discussion regarding LTAC. NCM reviewed choices of [facility]. Plan to continue to follow patient for medically stability and assist with transition to LTAC, Alexander [name] agreeable, has NCM name and phone number.   -1;38pm Per Hospitalist 4/25 note, plan transfer to [facility]. TOC will continue to follow.     Expected Discharge Plan: Home w Home Health Services Barriers to Discharge: Continued Medical Work up  Expected Discharge Plan and Services                                               Social Determinants of Health (SDOH) Interventions SDOH Screenings   Food Insecurity: No Food Insecurity (01/12/2024)  Housing: Low Risk  (01/12/2024)  Transportation Needs: No Transportation Needs (01/12/2024)  Utilities: Not At Risk (01/12/2024)  Depression (PHQ2-9): Low Risk  (07/24/2023)  Tobacco Use: Medium Risk (01/15/2024)    Readmission Risk Interventions    01/24/2024   10:43 AM 01/13/2024    9:13 AM 07/11/2023    2:11 PM  Readmission Risk Prevention Plan  Post Dischage Appt   Complete  Medication Screening   Complete  Transportation Screening Complete Complete Complete  PCP or Specialist Appt within 3-5 Days Complete Complete   HRI or Home Care Consult Complete Complete   Social Work Consult for Recovery Care Planning/Counseling Complete Complete   Palliative Care Screening Not Applicable Not Applicable   Medication Review [employer])  Complete Complete

## 2024-01-24 NOTE — Progress Notes (Signed)
 PROGRESS NOTE    Alexander [name]  ZOX:[id number] DOB: [birth year] DOA: 01/11/2024 PCP: Alexander Iba, PA    Brief Narrative:  This 63 years old male with PMH significant for acute pulmonary embolism, history of DVT on Coumadin , essential hypertension, peripheral arterial disease, high risk medication use, noncompliance, thrombocytopenia, iron  deficiency anemia presented in the ED with rectal bleeding while on Coumadin , hypotension.  He was admitted in the ICU with septic shock requiring Levophed  support.  He is found to have possible diverticulitis versus neoplasm, He was taken to the operating room and found to have perforated ascending colon with intra-abdominal abscess status post washout, right colectomy, second OR visit with completion of anastomosis, Creation of Colostomy.  Patient now remains off pressors, successfully extubated, temporarily required CRRT.  Still remains NPO. Patient is being transferred to [facility], Patient requires intermittent hemodialysis.  Assessment & Plan:   Principal Problem:   Spontaneous perforation of colon (HCC) Active Problems:   Diverticulitis   History of pulmonary embolism   ABLA (acute blood loss anemia)   Iron  deficiency anemia   AKI (acute kidney injury) (HCC)   History of pulmonary embolus (PE)   Essential hypertension, benign   Peripheral arterial disease (HCC)   Hyperlipidemia   Pulmonary hypertension (HCC)   Cor pulmonale (HCC)   Hematochezia   Acute blood loss anemia   Rectal bleeding   Generalized abdominal pain   Acute hypoxic respiratory failure (HCC)   Ascites   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   Coagulopathy (HCC)   Septic shock due to perforated colon, s/p surgical repair, washout, abdomen close out 4/19. Continue IV Zosyn  with a stop date in place. General Surgery is following. Strict NPO, continue TPN. Continue NGT Successfully weaned off pressors. Stress dose steroids stopped 4/21. Continue IV Tylenol   for fever, opiates as needed. Patient continued to have leukocytosis. 23.9K General surgery recommended CT abdomen and pelvis to rule out abscess.  Acute renal failure due to septic shock: Likely in the setting of abdominal infection. Patient initiated on CRRT.  Renal function slowly improving. CRRT discontinued 4/24 and await renal recovery. As per nephrology renal functions are not improving and recommended intermittent hemodialysis. Patient is being transferred to [facility].  Acute blood loss anemia: Continue to monitor H&H. Transfuse if hemoglobin below 7.0 There is no any obvious visible bleeding noted.  Acute hypoxic respiratory failure requiring mechanical ventilation: Patient was intubated on arrival and now successfully extubated on 4/23. Maintaining oxygenation on 2l [place].  Metabolic encephalopathy likely related to sepsis. Improving. Continue Precedex .  History of PE on warfarin: Hold Coumadin .  Elevated LFTs Supportive care, improving:   DVT prophylaxis: SCDs Code Status: Full code Family Communication: No family at bed side. Disposition Plan:    Status is: Inpatient Remains inpatient appropriate because: Severity of illness.    Consultants:  General Surgery Nephrology PCCM  Procedures:  Antimicrobials:  Anti-infectives (From admission, onward)    Start     Dose/Rate Route Frequency Ordered Stop   01/18/24 1400  piperacillin -tazobactam (ZOSYN ) IVPB 3.375 g  Status:  Discontinued        3.375 g 12.5 mL/hr over 240 Minutes Intravenous Every 6 hours 01/18/24 1241 01/18/24 1242   01/18/24 1400  piperacillin -tazobactam (ZOSYN ) IVPB 3.375 g        3.375 g 100 mL/hr over 30 Minutes Intravenous Every 6 hours 01/18/24 1242 01/23/24 2046   01/18/24 0000  piperacillin -tazobactam (ZOSYN ) IVPB 2.25 g  Status:  Discontinued  2.25 g 100 mL/hr over 30 Minutes Intravenous Every 6 hours 01/17/24 2120 01/18/24 1241   01/12/24 0200  piperacillin -tazobactam (ZOSYN )  IVPB 3.375 g  Status:  Discontinued        3.375 g 12.5 mL/hr over 240 Minutes Intravenous Every 8 hours 01/11/24 2120 01/17/24 2120   01/11/24 1815  piperacillin -tazobactam (ZOSYN ) IVPB 3.375 g        3.375 g 100 mL/hr over 30 Minutes Intravenous  Once 01/11/24 1806 01/11/24 1921       Subjective: Patient was seen and examined at bedside.  Overnight events noted. Patient reports feeling better,  He is fully oriented and following commands.  Patient has multiple lines.  Objective: Vitals:   01/24/24 0843 01/24/24 0900 01/24/24 0945 01/24/24 1000  BP:  (!) 159/66  (!) 154/71  Pulse:  100 100 97  Resp:  (!) 23 (!) 21 20  Temp: 100.1 F (37.8 C)     TempSrc: Oral     SpO2:  94% 94% 95%  Weight:      Height:        Intake/Output Summary (Last 24 hours) at 01/24/2024 1044 Last data filed at 01/24/2024 0900 Gross per 24 hour  Intake 2460.87 ml  Output 2550 ml  Net -89.13 ml   Filed Weights   01/22/24 0435 01/23/24 0500 01/24/24 0500  Weight: 83.5 kg 81.2 kg 77.5 kg    Examination:  General exam: Appears calm and comfortable, not in any acute distress, NG tube noted. Respiratory system: CTA bilaterally. Respiratory effort normal.  RR 16 Cardiovascular system: S1 & S2 heard, RRR. No JVD, murmurs, rubs, gallops or clicks.  Gastrointestinal system: Abdomen is non distended, soft and Mildly tender.  Left-sided drain noted.  Midline surgical scar noted right colostomy normal bowel sounds heard. Central nervous system: Alert and oriented X 3. No focal neurological deficits. Extremities: No edema, no cyanosis, no clubbing Skin: No rashes, lesions or ulcers Psychiatry: Judgement and insight appear normal. Mood & affect appropriate.     Data Reviewed: I have personally reviewed following labs and imaging studies  CBC: Recent Labs  Lab 01/17/24 1702 01/17/24 2222 01/19/24 1018 01/20/24 0427 01/21/24 0504 01/22/24 1033 01/23/24 0505 01/24/24 0552  WBC 20.6*  --   --   14.9*  --  15.8* 18.5* 23.9*  NEUTROABS 16.9*  --   --   --   --   --   --   --   HGB 8.3*   < > 9.6* 9.8*  --  9.3* 9.0* 8.5*  HCT 23.8*   < > 28.8* 29.3*  --  30.5* 28.5* 27.5*  MCV 85.0  --   --  89.1  --  95.9 94.4 95.5  PLT 159   < >  --  103*  103* 93* 139* 227 405*   < > = values in this interval not displayed.   Basic Metabolic Panel: Recent Labs  Lab 01/19/24 0108 01/19/24 1550 01/21/24 0504 01/21/24 1729 01/22/24 0417 01/22/24 1557 01/23/24 0503 01/23/24 0505 01/24/24 0552  NA 136   < > 134*   < > 138 138 135 134* 138  K 5.1   < > 4.3   < > 4.1 3.8 3.6 3.6 3.5  CL 95*   < > 100   < > 103 104 104 102 103  CO2 28   < > 26   < > 26 26 24 24 25   GLUCOSE 108*   < > 134*   < >  137* 136* 155* 154* 144*  BUN 70*   < > 40*   < > 36* 33* 32* 32* 57*  CREATININE 4.38*   < > 2.40*   < > 2.13* 2.12* 1.90* 1.94* 3.89*  CALCIUM  6.3*   < > 7.1*   < > 7.3* 7.2* 7.2* 7.1* 7.4*  MG 2.2  --  2.7*  --  2.5*  --   --  2.5* 2.3  PHOS 6.4*   < > 3.9   < > 2.2* 3.2 2.6 2.7 4.5   < > = values in this interval not displayed.   GFR: Estimated Creatinine Clearance: 19.9 mL/min (A) (by C-G formula based on SCr of 3.89 mg/dL (H)). Liver Function Tests: Recent Labs  Lab 01/20/24 0427 01/20/24 1524 01/21/24 0504 01/21/24 1729 01/22/24 0417 01/22/24 1557 01/23/24 0503 01/23/24 0505 01/24/24 0552  AST 340*  --  146*  --  85*  --   --  57* 41  ALT 666*  --  415*  --  265*  --   --  179* 118*  ALKPHOS 95  --  83  --  78  --   --  69 63  BILITOT 2.6*  --  1.9*  --  2.2*  --   --  2.2* 1.8*  PROT 5.0*  --  5.1*  --  5.1*  --   --  5.4* 5.4*  ALBUMIN  1.8*  1.9*   < > 1.7*  1.7*   < > 1.7*  1.7* 1.6* 1.7* 1.7* 1.5*  1.6*   < > = values in this interval not displayed.   No results for input(s): "LIPASE", "AMYLASE" in the last 168 hours. No results for input(s): "AMMONIA" in the last 168 hours. Coagulation Profile: Recent Labs  Lab 01/18/24 0726 01/19/24 0541 01/20/24 0427  01/21/24 0504 01/24/24 0552  INR 1.4* 1.1 1.0 1.0 1.1   Cardiac Enzymes: No results for input(s): "CKTOTAL", "CKMB", "CKMBINDEX", "TROPONINI" in the last 168 hours. BNP (last 3 results) No results for input(s): "PROBNP" in the last 8760 hours. HbA1C: No results for input(s): "HGBA1C" in the last 72 hours. CBG: Recent Labs  Lab 01/23/24 0620 01/23/24 1125 01/23/24 1811 01/23/24 2333 01/24/24 0617  GLUCAP 137* 128* 116* 126* 127*   Lipid Profile: No results for input(s): "CHOL", "HDL", "LDLCALC", "TRIG", "CHOLHDL", "LDLDIRECT" in the last 72 hours. Thyroid Function Tests: No results for input(s): "TSH", "T4TOTAL", "FREET4", "T3FREE", "THYROIDAB" in the last 72 hours. Anemia Panel: No results for input(s): "VITAMINB12", "FOLATE", "FERRITIN", "TIBC", "IRON ", "RETICCTPCT" in the last 72 hours. Sepsis Labs: No results for input(s): "PROCALCITON", "LATICACIDVEN" in the last 168 hours.  No results found for this or any previous visit (from the past 240 hours).  Radiology Studies: CT HEAD WO CONTRAST ( ) Result Date: 01/24/2024 CLINICAL DATA:  Encephalopathy EXAM: CT HEAD WITHOUT CONTRAST TECHNIQUE: Contiguous axial images were obtained from the base of the skull through the vertex without intravenous contrast. RADIATION DOSE REDUCTION: This exam was performed according to the departmental dose-optimization program which includes automated exposure control, adjustment of the mA and/or kV according to patient size and/or use of iterative reconstruction technique. COMPARISON:  None Available. FINDINGS: Brain: No mass,hemorrhage or extra-axial collection. Normal appearance of the parenchyma and CSF spaces. Vascular: No hyperdense vessel or unexpected vascular calcification. Skull: The visualized skull base, calvarium and extracranial soft tissues are normal. Sinuses/Orbits: Chronic right sphenoid sinusitis.  Normal orbits. Other: None. IMPRESSION: 1. No acute intracranial abnormality. 2.  Chronic  right sphenoid sinusitis. Electronically Signed   By: Juanetta Nordmann M.D.   On: 01/24/2024 01:13   Scheduled Meds:  Chlorhexidine  Gluconate Cloth  6 each Topical Daily   insulin  aspart  0-15 Units Subcutaneous Q6H   mouth rinse  15 mL Mouth Rinse 4 times per day   pantoprazole  (PROTONIX ) IV  40 mg Intravenous Q12H   thiamine  (VITAMIN B1) injection  100 mg Intravenous Daily   Continuous Infusions:  sodium chloride  10 mL/hr at 01/24/24 0700   heparin  1,100 Units/hr (01/24/24 0700)   TPN ADULT (ION) 90 mL/hr at 01/24/24 0700     LOS: 13 days    Time spent: 50 Mins    Magdalene School, MD [facility]   If 7PM-7AM, please contact night-coverage
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
## 2024-01-24 NOTE — Progress Notes (Signed)
 PHARMACY - TOTAL PARENTERAL NUTRITION CONSULT NOTE   Indication:  intolerance to enteral nutrition  Patient Measurements: Height: 5\' 9"  (175.3 cm) Weight: 77.5 kg (170 lb 13.7 oz) IBW/kg (Calculated) : 70.7 TPN AdjBW (KG): 80.7 Body mass index is 25.23 kg/m.  Assessment: 63 year old male intubated and sedated in the ICU. S/p multiple surgical procedures as detailed below. Patient with brief cardiac arrest 4/16 requiring 4 vasopressors and stress dose steroids. His vasopressor requirements have decreased. Decline in renal function with minimal UOP started on CRRT 4/19. Pharmacy to start TPN as patient is currently not a candidate for enteral feeding yet.  Glucose / Insulin : no hx DM, Goal CBG <180 -Range: 116 - 137 (3 units SSI required) Electrolytes: CRRT 4/19 > stopped 4/24 -Phos WNL but large increase from 2.7 to 4.5  -CorrCa (9.3) WNL (Pt has required aggressive Ca replacement during admission) -K on lower end of therapeutic range at 3.5 -Mg now is WNL, Na  WNL Renal: CRRT stopped 4/24. Monitor for renal recovery, may require intermittent HD. Nephrology following. May be transferring [facility] HD  Hepatic: LFTs continue to improve (shock liver). Albumin  low, Tbili elevated but trending down -TG 230 on 4/21 Intake / Output; MIVF:  -CRRT filtration per nephrology -Mostly anuric: UOP ~212 mL/24hr -NG output: 1820 mL, Stool: 408 mL -Drain output: 180 mL GI Imaging: 4/15 CT a/p 1. Persistent heterogeneous wall thickening of the right colon with surrounding inflammation and suspected enlarging contained perforation along the medial aspect of the ascending colon. As before, this may be secondary to diverticulitis, colitis or neoplasm. No organized fluid collection or pneumoperitoneum. General surgical consultation recommended. 2. Interval increased gastric distension with diffuse small and large bowel distension, most consistent with an ileus.  GI Surgeries / Procedures:  4/19  Re-exploration abdomen, small bowel resection, creation of end ileostomy, abd closure and placement of wound vac 4/17 Re-exploration abdomen, small bowel resection 4/16 Right colectomy, drainage of abscess 4/19 ex lap, end ileostomy excision, wound vac  Central access: CVC TPN start date: 4/21  Nutritional Goals: -Concentrated TPN to attempt to reduce volume -In response to new target goals, TPN at goal rate of 90 mL/hr will provide 108 g protein, 2181 kcal  RD Assessment: ( recalculated 4/24 after CRRT stopped)  Estimated Needs Total Energy Estimated Needs: 2000-2250 kcals Total Protein Estimated Needs: 100-115 grams Total Fluid Estimated Needs: >/= 2L  Current Nutrition: NPO  Plan:  Now:  KCl 10 mEq IV x 2 doses Given the volume of NG output with K on lower end of range, will give some supplementation. Conservative replacement given renal function and potential for HD.   At 1800: Continue TPN to goal rate of 90 mL/hr Concentrated with Clinisol  15% to reduce total volume ( decrease protein amount with new target goals)  Electrolytes in TPN:  Na 150 mEq/L  Other electrolytes removed due to renal function (CRRT stopping, potential for HD) Cl:Ac 1:1 Add standard MVI and trace elements to TPN Thiamine  100 mg IV daily x 5 days Continue sSSI q6h to moderate BMP, magnesium, phosphorus with AM labs   Monitor TPN labs on Mon/Thurs. Renal function panel + Mg ordered daily.    Van Gelinas, PharmD, BCPS 01/24/2024 11:02 AM

## 2024-01-24 NOTE — Progress Notes (Signed)
 [facility] NEPHROLOGY PROGRESS NOTE  Assessment/ Plan: Pt is a 63 y.o. yo male with past medical history of DVT, PE on Coumadin  admitted on 4/12 with rectal bleed and hypotension seen for AKI.  # AKI - mostly anuric, creat 1.0 early in this hospital stay. After onset of severe shock 4/16 pt had progressive AKI. UA negative. Renal US  shows no obstruction. AKI due to ATN primarily due to shock +/- IV contrast.  CRRT from 4/19-4/24. He is extubated and off of pressor.  Urine output is only around 200 cc.  No sign of renal recovery.  If no improvement in renal function then he will need intermittent HD.  Recommend to transfer him to [facility], discussed with ICU team and with the patient as well. Continue with strict ins and outs and daily lab.  # Volume overload: Improved with CRRT.  # Hypophosphatemia due to CRRT: Repleted, monitor.  #S/P cardiac arrest: 4/17  #Shock/ hypotension: Off of pressors now.  #Metabolic acidosis: resolved w/ CRRT.  IV bicarb gtt was dced.  # Acute diverticulitis: w/ perforated ascending colon, s/p ileostomy, R hemicolectomy and some small bowel resection. Per gen surg/ pmd.   #VDRF: per CCM, extubated now.  Discussed with ICU team.  Subjective: Seen and examined in ICU.  Urine output is around 200 cc.  Blood pressure acceptable.  Not on pressors and on nasal cannula.  He is more alert awake and able to  talk Objective Vital signs in last 24 hours: Vitals:   01/24/24 0145 01/24/24 0200 01/24/24 0333 01/24/24 0500  BP: 124/60 126/61    Pulse: 85 85    Resp: 15 14    Temp:   98.6 F (37 C)   TempSrc:   Axillary   SpO2: 100% 100%    Weight:    77.5 kg  Height:       Weight change: -3.7 kg  Intake/Output Summary (Last 24 hours) at 01/24/2024 0843 Last data filed at 01/24/2024 0700 Gross per 24 hour  Intake 2610.99 ml  Output 2470 ml  Net 140.99 ml       Labs: RENAL PANEL Recent Labs  Lab 01/19/24 0108  01/19/24 1550 01/21/24 0504 01/21/24 1729 01/22/24 0417 01/22/24 1557 01/23/24 0503 01/23/24 0505 01/24/24 0552  NA 136   < > 134*   < > 138 138 135 134* 138  K 5.1   < > 4.3   < > 4.1 3.8 3.6 3.6 3.5  CL 95*   < > 100   < > 103 104 104 102 103  CO2 28   < > 26   < > 26 26 24 24 25   GLUCOSE 108*   < > 134*   < > 137* 136* 155* 154* 144*  BUN 70*   < > 40*   < > 36* 33* 32* 32* 57*  CREATININE 4.38*   < > 2.40*   < > 2.13* 2.12* 1.90* 1.94* 3.89*  CALCIUM  6.3*   < > 7.1*   < > 7.3* 7.2* 7.2* 7.1* 7.4*  MG 2.2  --  2.7*  --  2.5*  --   --  2.5* 2.3  PHOS 6.4*   < > 3.9   < > 2.2* 3.2 2.6 2.7 4.5  ALBUMIN  2.0*  1.9*   < > 1.7*  1.7*   < > 1.7*  1.7* 1.6* 1.7* 1.7* 1.5*  1.6*   < > = values in this interval not displayed.  Liver Function Tests: Recent Labs  Lab 01/22/24 0417 01/22/24 1557 01/23/24 0503 01/23/24 0505 01/24/24 0552  AST 85*  --   --  57* 41  ALT 265*  --   --  179* 118*  ALKPHOS 78  --   --  69 63  BILITOT 2.2*  --   --  2.2* 1.8*  PROT 5.1*  --   --  5.4* 5.4*  ALBUMIN  1.7*  1.7*   < > 1.7* 1.7* 1.5*  1.6*   < > = values in this interval not displayed.   No results for input(s): "LIPASE", "AMYLASE" in the last 168 hours. No results for input(s): "AMMONIA" in the last 168 hours. CBC: Recent Labs    07/19/23 0448 07/22/23 0441 12/03/23 1437 12/04/23 1531 12/05/23 0036 01/11/24 1458 01/11/24 1957 01/12/24 0056 01/12/24 1044 01/12/24 1552 01/19/24 1018 01/20/24 0427 01/22/24 1033 01/23/24 0505 01/24/24 0552  HGB 10.1*   < > 7.6 Repeated and verified X2.*   < >  --    < > 6.7*   < >  --    < > 9.6* 9.8* 9.3* 9.0* 8.5*  MCV 92.7   < > 75.5*   < >  --    < > 76.6*   < >  --    < >  --  89.1 95.9 94.4 95.5  VITAMINB12 222  --   --   --  100*  --   --   --  93*  --   --   --   --   --   --   FOLATE 7.5  --   --   --  8.0  --   --   --  9.3  --   --   --   --   --   --   FERRITIN 25  --  4.5*  --  3*  --  19*  --   --   --   --   --   --   --    --   TIBC 328  --  382.2  --  364  --  293  --   --   --   --   --   --   --   --   IRON  25*  --  15*  --  11*  --  11*  --   --   --   --   --   --   --   --   RETICCTPCT 1.4  --   --   --  0.7  --  0.8  --   --   --   --   --   --   --   --    < > = values in this interval not displayed.    Cardiac Enzymes: No results for input(s): "CKTOTAL", "CKMB", "CKMBINDEX", "TROPONINI" in the last 168 hours. CBG: Recent Labs  Lab 01/23/24 0620 01/23/24 1125 01/23/24 1811 01/23/24 2333 01/24/24 0617  GLUCAP 137* 128* 116* 126* 127*    Iron  Studies: No results for input(s): "IRON ", "TIBC", "TRANSFERRIN", "FERRITIN" in the last 72 hours. Studies/Results: CT HEAD WO CONTRAST ( ) Result Date: 01/24/2024 CLINICAL DATA:  Encephalopathy EXAM: CT HEAD WITHOUT CONTRAST TECHNIQUE: Contiguous axial images were obtained from the base of the skull through the vertex without intravenous contrast. RADIATION DOSE REDUCTION: This exam was performed according to the departmental dose-optimization program which includes automated exposure control, adjustment of  the mA and/or kV according to patient size and/or use of iterative reconstruction technique. COMPARISON:  None Available. FINDINGS: Brain: No mass,hemorrhage or extra-axial collection. Normal appearance of the parenchyma and CSF spaces. Vascular: No hyperdense vessel or unexpected vascular calcification. Skull: The visualized skull base, calvarium and extracranial soft tissues are normal. Sinuses/Orbits: Chronic right sphenoid sinusitis.  Normal orbits. Other: None. IMPRESSION: 1. No acute intracranial abnormality. 2. Chronic right sphenoid sinusitis. Electronically Signed   By: Juanetta Nordmann M.D.   On: 01/24/2024 01:13   DG Abd 1 View Result Date: 01/22/2024 CLINICAL DATA:  NG tube placement EXAM: ABDOMEN - 1 VIEW COMPARISON:  01/15/2024, CT 01/14/2024. FINDINGS: NG tube tip is in the fundus of the stomach with the side port near the GE junction. Decompressed  stomach. Slight prominence of upper abdominal small bowel loops. These have decreased/improved since prior CT from 01/14/2024. IMPRESSION: NG tube tip in the fundus of the stomach with side port near the GE junction. Slight prominence of upper abdominal small bowel loops, improving since prior CT. Electronically Signed   By: Janeece Mechanic M.D.   On: 01/22/2024 10:42     Medications: Infusions:  sodium chloride  10 mL/hr at 01/24/24 0700   heparin  1,100 Units/hr (01/24/24 0700)   TPN ADULT (ION) 90 mL/hr at 01/24/24 0700    Scheduled Medications:  Chlorhexidine  Gluconate Cloth  6 each Topical Daily   insulin  aspart  0-15 Units Subcutaneous Q6H   mouth rinse  15 mL Mouth Rinse 4 times per day   pantoprazole  (PROTONIX ) IV  40 mg Intravenous Q12H   thiamine  (VITAMIN B1) injection  100 mg Intravenous Daily    have reviewed scheduled and prn medications.  Physical Exam: General: More alert awake and looks comfortable Heart:RRR, s1s2 nl Lungs: Coarse breath sound bilateral. Abdomen:soft, drain in place. Extremities: Trace peripheral edema Dialysis Access: Groin HD catheter  Lener Ventresca Lansing Planas 01/24/2024,8:43 AM  LOS: 13 days
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
## 2024-01-24 NOTE — Progress Notes (Signed)
 Patient ID: Alexander [name], male   DOB: 04/18/1961, 63 y.o.   MRN: [id number]   Follow up - Emergency General Surgery  Patient Details:    Alexander [name] is an 63 y.o. male.  Lines/tubes : Airway 7.5 mm (Active)  Secured at (cm) 23 cm 01/19/24 0329  Measured From Lips 01/19/24 0329  Secured Location Right 01/19/24 0329  Secured By [employer] 01/19/24 0329  Bite Block No 01/19/24 0329  Tube Holder Repositioned Yes 01/19/24 0329  Prone position No 01/19/24 0329  Head position Right 01/18/24 2000  Cuff Pressure (cm H2O) Clear OR 27-39 CmH2O 01/19/24 0329  Site Condition Dry 01/19/24 0329     CVC Double Lumen 01/15/24 Right Internal jugular 16 cm (Active)  Indication for Insertion or Continuance of Line Vasoactive infusions 01/19/24 0400  Site Assessment Clean, Dry, Intact 01/19/24 0400  Proximal Lumen Status Infusing 01/19/24 0400  Distal Lumen Status Infusing 01/19/24 0400  Dressing Type Transparent 01/19/24 0400  Dressing Status Antimicrobial disc/dressing in place;Clean;Dry 01/19/24 0400  Line Care Connections checked and tightened 01/19/24 0400  Dressing Intervention Dressing changed 01/19/24 0545  Dressing Change Due 01/26/24 01/19/24 0545     Arterial Line 01/15/24 Left Radial (Active)  Site Assessment Clean, Dry, Intact 01/19/24 0400  Line Status Pulsatile blood flow 01/19/24 0400  Art Line Waveform Appropriate 01/19/24 0400  Art Line Interventions Connections checked and tightened;Flushed per protocol;Line pulled back 01/19/24 0400  Color/Movement/Sensation Capillary refill less than 3 sec 01/19/24 0400  Dressing Type Transparent 01/19/24 0400  Dressing Status Clean, Dry, Intact;Antimicrobial disc in place 01/19/24 0400  Interventions Other (Comment) 01/19/24 0400  Dressing Change Due 01/22/24 01/19/24 0400     Closed System Drain LLQ Bulb (JP) 19 Fr. (Active)  Site Description Unable to view 01/18/24 2000  Dressing Status Clean, Dry, Intact 01/18/24 2000   Drainage Appearance Bright red;Serosanguineous 01/18/24 2000  Status To gravity (Uncharged) 01/18/24 2000  Output (mL) 20 mL 01/19/24 0800     Negative Pressure Wound Therapy Abdomen Medial;Upper (Active)  Site / Wound Assessment Dressing in place / Unable to assess 01/18/24 2000  Wound filler - Black foam 1 01/18/24 0900  Target Pressure (mmHg) 125 01/18/24 2000  Dressing Status Intact 01/18/24 2000  Drainage Amount Minimal 01/18/24 2000  Drainage Description Sanguineous 01/18/24 2000  Output (mL) 0 mL 01/19/24 0800     NG/OG Vented/Dual Lumen 18 Fr. Oral (Active)  Tube Position (Required) Marking at nare/corner of mouth 01/19/24 0745  Measurement (cm) (Required) 60 cm 01/18/24 2000  Ongoing Placement Verification (Required) (See row information) Yes 01/19/24 0745  Site Assessment Clean, Dry, Intact 01/19/24 0745  Interventions Other (Comment) 01/17/24 0515  Status Low intermittent suction 01/19/24 0745  Amount of suction 55 mmHg 01/18/24 2000  Drainage Appearance Bile;Brown 01/19/24 0745  Intake (mL) 60 mL 01/18/24 0020  Output (mL) 28 mL 01/19/24 0800     Ileostomy Standard (end) RUQ (Active)  Ostomy Pouch 1 piece 01/19/24 0745  Stoma Assessment Red 01/19/24 0745  Peristomal Assessment Intact 01/19/24 0745  Output (mL) 0 mL 01/19/24 0800     Urethral Catheter JAMIE BANKS RN Latex 14 Fr. (Active)  Indication for Insertion or Continuance of Catheter Therapy based on hourly urine output monitoring and documentation for critical condition (NOT STRICT I&O) 01/19/24 0745  Site Assessment Clean, Dry, Intact;Swelling 01/19/24 0745  Catheter Maintenance Bag below level of bladder;Catheter secured;Drainage bag/tubing not touching floor;Insertion date on drainage bag;No dependent loops;Seal intact 01/19/24 0745  Collection Container Standard  drainage bag 01/19/24 0745  Securement Method Adhesive securement device 01/19/24 0745  Urinary Catheter Interventions (if applicable) Unclamped  01/18/24 2000  Input (mL) 10 mL 01/16/24 2047  Output (mL) 11 mL 01/19/24 0800     Fecal Management System 30 mL (Active)  Does patient meet criteria for removal? No 01/19/24 0745  Daily care Skin around tube assessed;Assess location of position indicator line;Bag changed (every 24 hours) 01/19/24 0745  Patient Indicator Assessment Green 01/19/24 0745  Bulb Deflated and Reinflated Yes 01/19/24 0745  Amount in bulb 35 mL 01/19/24 0745  Output (mL) 0 mL 01/19/24 0800    Microbiology/Sepsis markers: Results for orders placed or performed during the hospital encounter of 01/11/24  Culture, blood (single)     Status: None   Collection Time: 01/11/24  7:57 PM   Specimen: BLOOD  Result Value Ref Range Status   Specimen Description   Final    BLOOD BLOOD RIGHT ARM Performed at [facility], [address] [place], [place] [address]    Special Requests   Final    BOTTLES DRAWN AEROBIC AND ANAEROBIC Blood Culture adequate volume Performed at [facility], [address] [place], [place] [address]    Culture  Setup Time   Final    GRAM POSITIVE RODS ANAEROBIC BOTTLE ONLY CRITICAL RESULT CALLED TO, READ BACK BY AND VERIFIED WITH: PHARMD E JACKSON 01/16/2024 @ 0350 BY AB    Culture   Final    EGGERTHELLA SPP Standardized susceptibility testing for this organism is not available. Performed at [facility], [address] [place], [place] [address]    Report Status 01/21/2024 FINAL  Final  MRSA Next Gen by PCR, Nasal     Status: Abnormal   Collection Time: 01/12/24 12:06 AM   Specimen: Nasal Mucosa; Nasal Swab  Result Value Ref Range Status   MRSA by PCR Next Gen DETECTED (A) NOT DETECTED Corrected    Comment: CRITICAL RESULT CALLED TO, READ BACK BY AND VERIFIED WITH: Godwin Lat, RN [phone] 01/12/24 MH (NOTE) The GeneXpert MRSA Assay (FDA approved for NASAL specimens only), is one component of a comprehensive MRSA colonization  surveillance program. It is not intended to diagnose MRSA infection nor to guide or monitor treatment for MRSA infections. Test performance is not FDA approved in patients less than 24 years old. Performed at [facility], [address] [place], [place] [address] CORRECTED ON 04/13 AT 0255: PREVIOUSLY REPORTED AS DETECTED CRITICAL RESULT CALLED TO, READ BACK BY AND VERIFIED WITH: Godwin Lat, RN [phone] 01/12/24 MH   Resp panel by RT-PCR (RSV, Flu A&B, Covid) Anterior Nasal Swab     Status: None   Collection Time: 01/12/24  9:53 AM   Specimen: Anterior Nasal Swab  Result Value Ref Range Status   SARS Coronavirus 2 by RT PCR NEGATIVE NEGATIVE Final    Comment: (NOTE) SARS-CoV-2 target nucleic acids are NOT DETECTED.  The SARS-CoV-2 RNA is generally detectable in upper respiratory specimens during the acute phase of infection. The lowest concentration of SARS-CoV-2 viral copies this assay can detect is 138 copies/mL. A negative result does not preclude SARS-Cov-2 infection and should not be used as the sole basis for treatment or other patient management decisions. A negative result may occur with  improper specimen collection/handling, submission of specimen other than nasopharyngeal swab, presence of viral mutation(s) within the areas targeted by this assay, and inadequate number of viral copies(<138 copies/mL). A negative result must  be combined with clinical observations, patient history, and epidemiological information. The expected result is Negative.  Fact Sheet for Patients:  BloggerCourse.com  Fact Sheet for Healthcare Providers:  SeriousBroker.it  This test is no t yet approved or cleared by the [place]  FDA and  has been authorized for detection and/or diagnosis of SARS-CoV-2 by FDA under an Emergency Use Authorization (EUA). This EUA will remain  in effect (meaning this test can be used) for the  duration of the COVID-19 declaration under Section 564(b)(1) of the Act, 21 U.S.C.section 360bbb-3(b)(1), unless the authorization is terminated  or revoked sooner.       Influenza A by PCR NEGATIVE NEGATIVE Final   Influenza B by PCR NEGATIVE NEGATIVE Final    Comment: (NOTE) The Xpert Xpress SARS-CoV-2/FLU/RSV plus assay is intended as an aid in the diagnosis of influenza from Nasopharyngeal swab specimens and should not be used as a sole basis for treatment. Nasal washings and aspirates are unacceptable for Xpert Xpress SARS-CoV-2/FLU/RSV testing.  Fact Sheet for Patients: BloggerCourse.com  Fact Sheet for Healthcare Providers: SeriousBroker.it  This test is not yet approved or cleared by the [place]  FDA and has been authorized for detection and/or diagnosis of SARS-CoV-2 by FDA under an Emergency Use Authorization (EUA). This EUA will remain in effect (meaning this test can be used) for the duration of the COVID-19 declaration under Section 564(b)(1) of the Act, 21 U.S.C. section 360bbb-3(b)(1), unless the authorization is terminated or revoked.     Resp Syncytial Virus by PCR NEGATIVE NEGATIVE Final    Comment: (NOTE) Fact Sheet for Patients: BloggerCourse.com  Fact Sheet for Healthcare Providers: SeriousBroker.it  This test is not yet approved or cleared by the [place]  FDA and has been authorized for detection and/or diagnosis of SARS-CoV-2 by FDA under an Emergency Use Authorization (EUA). This EUA will remain in effect (meaning this test can be used) for the duration of the COVID-19 declaration under Section 564(b)(1) of the Act, 21 U.S.C. section 360bbb-3(b)(1), unless the authorization is terminated or revoked.  Performed at [facility], [address] [place], [place] [address]   Culture, blood (Routine X 2) w Reflex to ID  Panel     Status: None   Collection Time: 01/13/24  7:16 AM   Specimen: BLOOD LEFT HAND  Result Value Ref Range Status   Specimen Description   Final    BLOOD LEFT HAND Performed at [facility], [address] [place], [place] [address]    Special Requests   Final    BOTTLES DRAWN AEROBIC AND ANAEROBIC Blood Culture results may not be optimal due to an inadequate volume of blood received in culture bottles Performed at [facility], [address], [place], [place] [address]    Culture   Final    NO GROWTH 5 DAYS Performed at [facility], [address], [place], [place] [address]    Report Status 01/18/2024 FINAL  Final  Culture, blood (Routine X 2) w Reflex to ID Panel     Status: None   Collection Time: 01/13/24  7:16 AM   Specimen: BLOOD RIGHT ARM  Result Value Ref Range Status   Specimen Description   Final    BLOOD RIGHT ARM Performed at [facility], [address] [place], [place] [address]    Special Requests   Final    BOTTLES DRAWN AEROBIC AND ANAEROBIC Blood Culture results may not be optimal due to  an inadequate volume of blood received in culture bottles Performed at [facility], [address] [place], [place] [address]    Culture   Final    NO GROWTH 5 DAYS Performed at [facility], [address], [place], [place] [address]    Report Status 01/18/2024 FINAL  Final    Anti-infectives:  Anti-infectives (From admission, onward)    Start     Dose/Rate Route Frequency Ordered Stop   01/18/24 1400  piperacillin -tazobactam (ZOSYN ) IVPB 3.375 g  Status:  Discontinued        3.375 g 12.5 mL/hr over 240 Minutes Intravenous Every 6 hours 01/18/24 1241 01/18/24 1242   01/18/24 1400  piperacillin -tazobactam (ZOSYN ) IVPB 3.375 g        3.375 g 100 mL/hr over 30 Minutes Intravenous Every 6 hours 01/18/24 1242 01/23/24 2046   01/18/24 0000  piperacillin -tazobactam (ZOSYN ) IVPB 2.25 g  Status:   Discontinued        2.25 g 100 mL/hr over 30 Minutes Intravenous Every 6 hours 01/17/24 2120 01/18/24 1241   01/12/24 0200  piperacillin -tazobactam (ZOSYN ) IVPB 3.375 g  Status:  Discontinued        3.375 g 12.5 mL/hr over 240 Minutes Intravenous Every 8 hours 01/11/24 2120 01/17/24 2120   01/11/24 1815  piperacillin -tazobactam (ZOSYN ) IVPB 3.375 g        3.375 g 100 mL/hr over 30 Minutes Intravenous  Once 01/11/24 1806 01/11/24 1921       Best Practice/Protocols:  VTE Prophylaxis: Mechanical GI Prophylaxis: Proton Pump Inhibitor Continous Sedation  Consults: Treatment Team:  Lenise Quince, Md, MD Chucky Craver, MD    Studies:    Events:  Subjective:    Overnight Issues:  Got 1u prbc for hgb 6.9 overnight; pressor requirement down started on CRRT yesterday Objective:  Vital signs for last 24 hours: Temp:  [97.6 F (36.4 C)-100.1 F (37.8 C)] 100.1 F (37.8 C) (04/25 0843) Pulse Rate:  [85-100] 85 (04/25 0200) Resp:  [14-19] 14 (04/25 0200) BP: (117-143)/(59-71) 126/61 (04/25 0200) SpO2:  [97 %-100 %] 100 % (04/25 0200) Arterial Line BP: (155-186)/(49-56) 170/56 (04/24 1630) Weight:  [77.5 kg] 77.5 kg (04/25 0500)  Hemodynamic parameters for last 24 hours:    Intake/Output from previous day: 04/24 0701 - 04/25 0700 In: 2612.4 [I.V.:2276.7; IV Piggyback:290.7] Out: 2723 [Urine:230; Emesis/NG output:1850; Drains:205; Stool:438]  Intake/Output this shift: Total I/O In: -  Out: 415 [Drains:15; Stool:400]  Vent settings for last 24 hours:    Physical Exam:  Soft, some distension JP -serosanguionus Soft tissue wound vac secure Ileostomy -edematous/viable; Some dark brown thin fluid in bag NG with liters out last 24 hours B/l LE edema Arouses to voice  Results for orders placed or performed during the hospital encounter of 01/11/24 (from the past 24 hours)  Glucose, capillary     Status: Abnormal   Collection Time: 01/23/24 11:25 AM  Result Value Ref Range    Glucose-Capillary 128 (H) 70 - 99 mg/dL   Comment 1 Notify RN    Comment 2 Document in Chart   Glucose, capillary     Status: Abnormal   Collection Time: 01/23/24  6:11 PM  Result Value Ref Range   Glucose-Capillary 116 (H) 70 - 99 mg/dL  Heparin  level (unfractionated)     Status: Abnormal   Collection Time: 01/23/24 11:26 PM  Result Value Ref Range   Heparin  Unfractionated <0.10 (L) 0.30 - 0.70 IU/mL  Glucose, capillary  Status: Abnormal   Collection Time: 01/23/24 11:33 PM  Result Value Ref Range   Glucose-Capillary 126 (H) 70 - 99 mg/dL  Renal function panel (daily at 0500)     Status: Abnormal   Collection Time: 01/24/24  5:52 AM  Result Value Ref Range   Sodium 138 135 - 145 mmol/L   Potassium 3.5 3.5 - 5.1 mmol/L   Chloride 103 98 - 111 mmol/L   CO2 25 22 - 32 mmol/L   Glucose, Bld 144 (H) 70 - 99 mg/dL   BUN 57 (H) 8 - 23 mg/dL   Creatinine, Ser 1.30 (H) 0.61 - 1.24 mg/dL   Calcium  7.4 (L) 8.9 - 10.3 mg/dL   Phosphorus 4.5 2.5 - 4.6 mg/dL   Albumin  1.5 (L) 3.5 - 5.0 g/dL   GFR, Estimated 17 (L) >60 mL/min   Anion gap 10 5 - 15  Magnesium      Status: None   Collection Time: 01/24/24  5:52 AM  Result Value Ref Range   Magnesium  2.3 1.7 - 2.4 mg/dL  Hepatic function panel     Status: Abnormal   Collection Time: 01/24/24  5:52 AM  Result Value Ref Range   Total Protein 5.4 (L) 6.5 - 8.1 g/dL   Albumin  1.6 (L) 3.5 - 5.0 g/dL   AST 41 15 - 41 U/L   ALT 118 (H) 0 - 44 U/L   Alkaline Phosphatase 63 38 - 126 U/L   Total Bilirubin 1.8 (H) 0.0 - 1.2 mg/dL   Bilirubin, Direct 1.0 (H) 0.0 - 0.2 mg/dL   Indirect Bilirubin 0.8 0.3 - 0.9 mg/dL  CBC     Status: Abnormal   Collection Time: 01/24/24  5:52 AM  Result Value Ref Range   WBC 23.9 (H) 4.0 - 10.5 K/uL   RBC 2.88 (L) 4.22 - 5.81 MIL/uL   Hemoglobin 8.5 (L) 13.0 - 17.0 g/dL   HCT 86.5 (L) 78.4 - 69.6 %   MCV 95.5 80.0 - 100.0 fL   MCH 29.5 26.0 - 34.0 pg   MCHC 30.9 30.0 - 36.0 g/dL   RDW 29.5 (H) 28.4 - 13.2 %    Platelets 405 (H) 150 - 400 K/uL   nRBC 0.0 0.0 - 0.2 %  Protime-INR     Status: None   Collection Time: 01/24/24  5:52 AM  Result Value Ref Range   Prothrombin  Time 14.8 11.4 - 15.2 seconds   INR 1.1 0.8 - 1.2  Glucose, capillary     Status: Abnormal   Collection Time: 01/24/24  6:17 AM  Result Value Ref Range   Glucose-Capillary 127 (H) 70 - 99 mg/dL      Latest Ref Rng & Units 01/24/2024    5:52 AM 01/23/2024    5:05 AM 01/22/2024   10:33 AM  CBC  WBC 4.0 - 10.5 K/uL 23.9  18.5  15.8   Hemoglobin 13.0 - 17.0 g/dL 8.5  9.0  9.3   Hematocrit 39.0 - 52.0 % 27.5  28.5  30.5   Platelets 150 - 400 K/uL 405  227  139      Assessment & Plan: Present on Admission:  (Resolved) Severe sepsis (HCC)  Essential hypertension, benign  History of pulmonary embolism  Diverticulitis  AKI (acute kidney injury) (HCC)  Hyperlipidemia  Spontaneous perforation of colon (HCC)  Ascites  History of pulmonary embolus (PE)  Pulmonary hypertension (HCC)  Cor pulmonale (HCC)  Peripheral arterial disease (HCC)  LAPAROTOMY, EXPLORATORY RIGHT COLECTOMY (without anastomosis) PLACEMENT  OF ABTHERA WOUND VAC DRAINAGE OF INTRA-ABDOMINAL ABSCESS  4/16 EW   RE-EXPLORATION ABDOMEN, PLACEMENT OF ABTHERA WOUND VAC, SMALL BOWEL RESECTION 01/16/2024 EW  Re-exploration abdomen, small bowel resection, creation of end ileostomy, abd closure, placement wound vac 4/19 EW   FEN - NPO, TPN till return of normal bowel function, anticipate prolonged ileus and high ostomy output.  High NG output and thin fluid still out of ostomy - does not look ready for enteral nutrition yet Septic shock - improved - per CCM ABL anemia - stabilized monitor and transfuse as needed(13 u prbc total, 1u cryo, 6u FFP, 1 plt) AKI - started CRRT 4/19,  defer to CCM/renal ID - cont broad spectrum IV abx; WBC going back up today, would like to check CT scan for undrained abdominal fluid collections, will discuss with CCM  Acute hypoxemic  resp failure - per CCM  H/o VTE/PE/DVT - scds, okay for heparin   Dispo: ICU   LOS: 13 days     01/24/2024   Junie Olds, MD  [facility] - [facility]

## 2024-01-25 ENCOUNTER — Encounter (HOSPITAL_COMMUNITY): Payer: Self-pay

## 2024-01-25 DIAGNOSIS — N179 Acute kidney failure, unspecified: Secondary | ICD-10-CM | POA: Diagnosis not present

## 2024-01-25 DIAGNOSIS — D62 Acute posthemorrhagic anemia: Secondary | ICD-10-CM | POA: Diagnosis not present

## 2024-01-25 DIAGNOSIS — K631 Perforation of intestine (nontraumatic): Secondary | ICD-10-CM | POA: Diagnosis not present

## 2024-01-25 DIAGNOSIS — D689 Coagulation defect, unspecified: Secondary | ICD-10-CM

## 2024-01-25 DIAGNOSIS — J9601 Acute respiratory failure with hypoxia: Secondary | ICD-10-CM | POA: Diagnosis not present

## 2024-01-25 LAB — RENAL FUNCTION PANEL
Albumin: <1.5 g/dL — ABNORMAL LOW (ref 3.5–5.0)
Anion gap: 12 (ref 5–15)
BUN: 81 mg/dL — ABNORMAL HIGH (ref 8–23)
CO2: 22 mmol/L (ref 22–32)
Calcium: 7.6 mg/dL — ABNORMAL LOW (ref 8.9–10.3)
Chloride: 106 mmol/L (ref 98–111)
Creatinine, Ser: 5.21 mg/dL — ABNORMAL HIGH (ref 0.61–1.24)
GFR, Estimated: 12 mL/min — ABNORMAL LOW (ref >60)
Glucose, Bld: 153 mg/dL — ABNORMAL HIGH (ref 70–99)
Phosphorus: 4.7 mg/dL — ABNORMAL HIGH (ref 2.5–4.6)
Potassium: 3.6 mmol/L (ref 3.5–5.1)
Sodium: 140 mmol/L (ref 135–145)

## 2024-01-25 LAB — GLUCOSE, CAPILLARY
Glucose-Capillary: 124 mg/dL — ABNORMAL HIGH (ref 70–99)
Glucose-Capillary: 130 mg/dL — ABNORMAL HIGH (ref 70–99)
Glucose-Capillary: 130 mg/dL — ABNORMAL HIGH (ref 70–99)
Glucose-Capillary: 157 mg/dL — ABNORMAL HIGH (ref 70–99)

## 2024-01-25 LAB — HEPATITIS B SURFACE ANTIGEN: Hepatitis B Surface Ag: NONREACTIVE

## 2024-01-25 LAB — HEPARIN LEVEL (UNFRACTIONATED)
Heparin Unfractionated: 0.11 [IU]/mL — ABNORMAL LOW (ref 0.30–0.70)
Heparin Unfractionated: 0.26 [IU]/mL — ABNORMAL LOW (ref 0.30–0.70)

## 2024-01-25 LAB — MAGNESIUM: Magnesium: 2.2 mg/dL (ref 1.7–2.4)

## 2024-01-25 MED ORDER — TRACE MINERALS CU-MN-SE-ZN 300-55-60-3000 MCG/ML IV SOLN
INTRAVENOUS | Status: AC
  Filled 2024-01-25: qty 720

## 2024-01-25 MED ORDER — POTASSIUM CHLORIDE 10 MEQ/50ML IV SOLN
10.0000 meq | INTRAVENOUS | Status: AC
  Administered 2024-01-25 (×2): 10 meq via INTRAVENOUS
  Filled 2024-01-25 (×2): qty 50

## 2024-01-25 MED ORDER — CARMEX CLASSIC LIP BALM EX OINT
1.0000 | TOPICAL_OINTMENT | CUTANEOUS | Status: DC | PRN
  Administered 2024-01-26: 1 via TOPICAL
  Filled 2024-01-25 (×2): qty 10

## 2024-01-25 NOTE — Subjective & Objective (Signed)
 Patient seen and examined. Remains on TPN.  RN reports that general surgery is going to order NG tube to be removed.  I&O are incorrectly documented. Documented that pt's 24 hour output was 140,100 ml.  That would be 140 Liters!!!

## 2024-01-25 NOTE — Assessment & Plan Note (Signed)
 01-25-2024 no longer bleeding. Due to elevated INR from coumadin , perforated colon. S/p 13 units PRBC since admission.  01-26-2024 HgB stable at 7.9  01-27-2024 HgB 8.2 g/dl today.  01-28-2024 awaiting AM CBC today.

## 2024-01-25 NOTE — Assessment & Plan Note (Signed)
 01-25-2024 due to perforated colon and elevated INR. Now resolved.
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
## 2024-01-25 NOTE — Progress Notes (Signed)
 PHARMACY - TOTAL PARENTERAL NUTRITION CONSULT NOTE   Indication:  intolerance to enteral nutrition  Patient Measurements: Height: 5\' 9"  (175.3 cm) Weight: 78.6 kg (173 lb 4.5 oz) IBW/kg (Calculated) : 70.7 TPN AdjBW (KG): 78.1 Body mass index is 25.59 kg/m.  Assessment: 63 year old male intubated and sedated in the ICU. S/p multiple surgical procedures as detailed below. Patient with brief cardiac arrest 4/16 requiring 4 vasopressors and stress dose steroids. His vasopressor requirements have decreased. Decline in renal function with minimal UOP started on CRRT 4/19. Pharmacy to start TPN as patient is currently not a candidate for enteral feeding yet.  Glucose / Insulin : no hx DM, CBG <180 on mSSI, 3 units used in 24h Electrolytes: K 3.6 s/p 2 runs, Mg 2.2, Phos 4.7, others WNL Required aggressive Ca replacement earlier in admission Renal: CRRT 4/19 >> 4/24. Monitor for renal recovery, short HD session planned for 4/26. Nephrology following. Hepatic: LFTs trending down (shock liver), albumin  <1.5, Tbili elevated but trending down, TG 230 on 4/21 Intake / Output; MIVF: UOP 500 mL, NG output down 160 mL, stool output 1255 mL, drain output down 75 mL GI Imaging: 4/15 CT A/P: 1. Persistent heterogeneous wall thickening of the right colon with surrounding inflammation and suspected enlarging contained perforation along the medial aspect of the ascending colon. As before, this may be secondary to diverticulitis, colitis or neoplasm. No organized fluid collection or pneumoperitoneum. General surgical consultation recommended. 2. Interval increased gastric distension with diffuse small and large bowel distension, most consistent with an ileus. 4/25 CT A/P: 5.9 cm fluid collection inferior to the gallbladder, possibly developing hepatic abscesses but incompletely characterized   GI Surgeries / Procedures:  4/19 Re-exploration abdomen, small bowel resection, creation of end ileostomy, abd closure  and placement of wound vac 4/17 Re-exploration abdomen, small bowel resection 4/16 Right colectomy, drainage of abscess 4/19 ex lap, end ileostomy excision, wound vac  Central access: CVC TPN start date: 4/21  Nutritional Goals: -Concentrated TPN to attempt to reduce volume -In response to new target goals, TPN at goal rate of 90 mL/hr will provide 108 g protein, 2181 kcal  RD Assessment: (recalculated 4/24 after CRRT stopped)  Estimated Needs Total Energy Estimated Needs: 2000-2250 kcals Total Protein Estimated Needs: 100-115 grams Total Fluid Estimated Needs: >/= 2L  Current Nutrition: NPO and TPN  Plan: Continue concentrated TPN at goal of 46mL/hr at 1800 Electrolytes in TPN: Na 162mEq/L, K 76mEq/L, Ca 106mEq/L, Mg 24mEq/L, and Phos 60mmol/L. Cl:Ac 1:1 Add standard MVI and trace elements to TPN Thiamine  100 mg IV daily x 5 days (4/26 >> 4/30)  KCl 10 mEq IV q1h x2 - discussed with Dr Cindra Cree Consider adding KCl to TPN if renal function improves Continue Moderate q6h SSI and adjust as needed  Monitor TPN labs on Mon/Thurs, renal function panel + Mg ordered daily  Thank you for involving pharmacy in this patient's care.  Caroline Cinnamon, PharmD, BCPS Clinical Pharmacist Clinical phone for 01/25/2024 is [phone] 01/25/2024 7:02 AM

## 2024-01-25 NOTE — Progress Notes (Signed)
 [facility] NEPHROLOGY PROGRESS NOTE  Assessment/ Plan: Pt is a 63 y.o. yo male with past medical history of DVT, PE on Coumadin  admitted on 4/12 with rectal bleed and hypotension seen for AKI.  # AKI - mostly anuric, creat 1.0 early in this hospital stay. After onset of severe shock 4/16 pt had progressive AKI. UA negative. Renal US  shows no obstruction. AKI due to ATN primarily due to shock +/- IV contrast.  CRRT from 4/19-4/24. Urine output slowly increasing but creatinine rising.  Showing some moderate signs of improvement - Plan for short session of dialysis today - Reassess for dialysis needs daily - Consider tunneled dialysis catheter if patient has ongoing renal replacement therapy needs  #S/P cardiac arrest: 4/17  #Shock/ hypotension: Off of pressors now.  # Acute diverticulitis: w/ perforated ascending colon, s/p ileostomy, R hemicolectomy and some small bowel resection. Per gen surg/ pmd.  Advancing diet as able.  Currently on TPN  #VDRF: Improved now extubated    Subjective: Patient denies any complaints.  Wants ginger ale and ice chips.  States that he has somewhere he needs to be by 830.  Seems to be disoriented.  Objective Vital signs in last 24 hours: Vitals:   01/24/24 2320 01/25/24 0300 01/25/24 0350 01/25/24 0725  BP: 126/85 (!) 143/76  (!) 150/72  Pulse: 89 86    Resp: 20 14    Temp: 98.9 F (37.2 C) 97.9 F (36.6 C)  98.2 F (36.8 C)  TempSrc: Oral Oral  Axillary  SpO2: 91% 94%    Weight: 78.1 kg  78.6 kg   Height: 5\' 9"  (1.753 m)      Weight change: 0.6 kg  Intake/Output Summary (Last 24 hours) at 01/25/2024 0826 Last data filed at 01/25/2024 0653 Gross per 24 hour  Intake --  Output 1960 ml  Net -1960 ml       Labs: RENAL PANEL Recent Labs  Lab 01/19/24 0108 01/19/24 1550 01/21/24 0504 01/21/24 1729 01/22/24 0417 01/22/24 1557 01/23/24 0503 01/23/24 0505 01/24/24 0552 01/25/24 0540  NA 136   < > 134*   < > 138 138  135 134* 138 140  K 5.1   < > 4.3   < > 4.1 3.8 3.6 3.6 3.5 3.6  CL 95*   < > 100   < > 103 104 104 102 103 106  CO2 28   < > 26   < > 26 26 24 24 25 22   GLUCOSE 108*   < > 134*   < > 137* 136* 155* 154* 144* 153*  BUN 70*   < > 40*   < > 36* 33* 32* 32* 57* 81*  CREATININE 4.38*   < > 2.40*   < > 2.13* 2.12* 1.90* 1.94* 3.89* 5.21*  CALCIUM  6.3*   < > 7.1*   < > 7.3* 7.2* 7.2* 7.1* 7.4* 7.6*  MG 2.2  --  2.7*  --  2.5*  --   --  2.5* 2.3  --   PHOS 6.4*   < > 3.9   < > 2.2* 3.2 2.6 2.7 4.5 4.7*  ALBUMIN  2.0*  1.9*   < > 1.7*  1.7*   < > 1.7*  1.7* 1.6* 1.7* 1.7* 1.5*  1.6* <1.5*   < > = values in this interval not displayed.    Liver Function Tests: Recent Labs  Lab 01/22/24 0417 01/22/24 1557 01/23/24 0505 01/24/24 0552 01/25/24 0540  AST 85*  --  57* 41  --   ALT 265*  --  179* 118*  --   ALKPHOS 78  --  69 63  --   BILITOT 2.2*  --  2.2* 1.8*  --   PROT 5.1*  --  5.4* 5.4*  --   ALBUMIN  1.7*  1.7*   < > 1.7* 1.5*  1.6* <1.5*   < > = values in this interval not displayed.   No results for input(s): "LIPASE", "AMYLASE" in the last 168 hours. No results for input(s): "AMMONIA" in the last 168 hours. CBC: Recent Labs    07/19/23 0448 07/22/23 0441 12/03/23 1437 12/04/23 1531 12/05/23 0036 01/11/24 1458 01/11/24 1957 01/12/24 0056 01/12/24 1044 01/12/24 1552 01/19/24 1018 01/20/24 0427 01/22/24 1033 01/23/24 0505 01/24/24 0552  HGB 10.1*   < > 7.6 Repeated and verified X2.*   < >  --    < > 6.7*   < >  --    < > 9.6* 9.8* 9.3* 9.0* 8.5*  MCV 92.7   < > 75.5*   < >  --    < > 76.6*   < >  --    < >  --  89.1 95.9 94.4 95.5  VITAMINB12 222  --   --   --  100*  --   --   --  93*  --   --   --   --   --   --   FOLATE 7.5  --   --   --  8.0  --   --   --  9.3  --   --   --   --   --   --   FERRITIN 25  --  4.5*  --  3*  --  19*  --   --   --   --   --   --   --   --   TIBC 328  --  382.2  --  364  --  293  --   --   --   --   --   --   --   --   IRON  25*  --   15*  --  11*  --  11*  --   --   --   --   --   --   --   --   RETICCTPCT 1.4  --   --   --  0.7  --  0.8  --   --   --   --   --   --   --   --    < > = values in this interval not displayed.    Cardiac Enzymes: No results for input(s): "CKTOTAL", "CKMB", "CKMBINDEX", "TROPONINI" in the last 168 hours. CBG: Recent Labs  Lab 01/24/24 0617 01/24/24 1117 01/24/24 1801 01/25/24 0015 01/25/24 0621  GLUCAP 127* 116* 111* 130* 157*    Iron  Studies: No results for input(s): "IRON ", "TIBC", "TRANSFERRIN", "FERRITIN" in the last 72 hours. Studies/Results: CT ABDOMEN PELVIS WO CONTRAST Result Date: 01/24/2024 CLINICAL DATA:  Abdominal abscess EXAM: CT ABDOMEN AND PELVIS WITHOUT CONTRAST TECHNIQUE: Multidetector CT imaging of the abdomen and pelvis was performed following the standard protocol without IV contrast. RADIATION DOSE REDUCTION: This exam was performed according to the departmental dose-optimization program which includes automated exposure control, adjustment of the mA and/or kV according to patient size and/or use of iterative reconstruction technique. COMPARISON:  01/14/2019 FINDINGS: Lower chest:  Small pleural effusions, increased on the right since previous. Dependent consolidation/atelectasis throughout the right lower lobe and in the posterior left lower lobe. Blood pool is hypodense compared to the interventricular septum suggesting anemia. Hepatobiliary: New heterogenous low-attenuation poorly marginated parenchymal regions involving primarily the posterior right hepatic lobe, and hepatic segment 4. The gallbladder is nondistended. No biliary ductal dilatation. Pancreas: Unremarkable. No pancreatic ductal dilatation or surrounding inflammatory changes. Spleen: Normal in size without focal abnormality. Adrenals/Urinary Tract: Normal adrenal glands. Symmetric renal contours without urolithiasis or hydronephrosis. Urinary bladder decompressed by Foley catheter. Stomach/Bowel: Gastric tube  passes through decompressed stomach into the third portion of the decompressed duodenum. Contrast material in multiple mildly distended small bowel loops in the left and mid abdomen. Right lower quadrant ileostomy. Partial right colectomy. Distal colon decompressed, with scattered descending diverticula. Vascular/Lymphatic: Right femoral venous catheter to the right common iliac vein. Scattered aortoiliac calcified plaque without AAA. No abdominal or pelvic adenopathy localized. Reproductive: Prostate is unremarkable. Other: Left lower quadrant surgical drain loops under the liver. Poorly marginated 5.9 cm fluid collection adjacent to the drain inferior to the gallbladder. Edema throughout the mesentery. No definite free air. Musculoskeletal: Lumbar spondylitic change L4-S1. Vertebral endplate spurring at multiple levels in the lower thoracic spine. 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 IMPRESSION: 1. 5.9 cm fluid collection inferior to the gallbladder, adjacent to the surgical drain. 2. New heterogenous low-attenuation poorly marginated parenchymal regions involving primarily the posterior right hepatic lobe, and hepatic segment 4, possibly developing hepatic abscesses but incompletely characterized. Contrast-enhanced CT or MRI may be useful for further characterization. 3. Small pleural effusions, increased on the right since previous. Dependent consolidation/atelectasis throughout the right lower lobe and in the posterior left lower lobe. Electronically Signed   By: Nicoletta Barrier M.D.   On: 01/24/2024 16:36   CT HEAD WO CONTRAST ( ) Result Date: 01/24/2024 CLINICAL DATA:  Encephalopathy EXAM: CT HEAD WITHOUT CONTRAST TECHNIQUE: Contiguous axial images were obtained from the base of the skull through the vertex without intravenous contrast. RADIATION DOSE REDUCTION: This exam was performed according to the departmental dose-optimization program which includes automated exposure control,  adjustment of the mA and/or kV according to patient size and/or use of iterative reconstruction technique. COMPARISON:  None Available. FINDINGS: Brain: No mass,hemorrhage or extra-axial collection. Normal appearance of the parenchyma and CSF spaces. Vascular: No hyperdense vessel or unexpected vascular calcification. Skull: The visualized skull base, calvarium and extracranial soft tissues are normal. Sinuses/Orbits: Chronic right sphenoid sinusitis.  Normal orbits. Other: None. IMPRESSION: 1. No acute intracranial abnormality. 2. Chronic right sphenoid sinusitis. Electronically Signed   By: Juanetta Nordmann M.D.   On: 01/24/2024 01:13     Medications: Infusions:  sodium chloride  10 mL/hr at 01/24/24 0700   heparin  1,600 Units/hr (01/25/24 0003)   TPN ADULT (ION) 90 mL/hr at 01/24/24 1823    Scheduled Medications:  Chlorhexidine  Gluconate Cloth  6 each Topical Daily   insulin  aspart  0-15 Units Subcutaneous Q6H   mouth rinse  15 mL Mouth Rinse 4 times per day   pantoprazole  (PROTONIX ) IV  40 mg Intravenous Q12H    have reviewed scheduled and prn medications.  Physical Exam: GEN: wdwn, sitting in bed, nad ENT: no nasal discharge, mmm EYES: no scleral icterus, eomi CV: normal rate, no murmurs PULM: no iwob, bilateral chest rise ABD: NABS, non-distended SKIN: no rashes or jaundice EXT: Trace edema, warm and well perfused   Alexander [name] 01/25/2024,8:26 AM  LOS: 14  days

## 2024-01-25 NOTE — Assessment & Plan Note (Signed)
 01-25-2024 remains on IV heparin  gtts.  01-26-2024 remains on IV heparin  gtts.  01-27-2024 was on coumadin  at home. Currently on IV heparin . He still has right femoral HD catheter. Will leave him on IV heparin  until it is decided what kind of HD catheter he is going to need.  01-28-2024 remains on IV heparin  gtts. Will hold while he gets CT abd and awaiting decision for abd drain repositioning/insertion by surgery service.

## 2024-01-25 NOTE — Assessment & Plan Note (Signed)
 01-25-2024 was extubated on 01-22-2024. Currently on RA.

## 2024-01-25 NOTE — Assessment & Plan Note (Signed)
 01-25-2024 due to coumadin , colonic perforation. Now resolved.
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
## 2024-01-25 NOTE — Progress Notes (Signed)
 7 Days Post-Op  Subjective: C/o dry mouth and wanting ice chips and ginger ale.     Objective: Vital signs in last 24 hours: Temp:  [97.9 F (36.6 C)-99.8 F (37.7 C)] 98.2 F (36.8 C) (04/26 0725) Pulse Rate:  [86-100] 86 (04/26 0300) Resp:  [14-24] 14 (04/26 0300) BP: (126-167)/(66-85) 150/72 (04/26 0725) SpO2:  [91 %-97 %] 94 % (04/26 0300) Weight:  [78.1 kg-78.6 kg] 78.6 kg (04/26 0350) Last BM Date : 01/21/24  Intake/Output from previous day: 04/25 0701 - 04/26 0700 In: -  Out: 1960 [Urine:500; Emesis/NG output:160; Drains:75; Stool:1225] Intake/Output this shift: Total I/O In: 336.2 [I.V.:336.2] Out: -   PE: Abd: soft, NGT with bilious output.  160cc documented yesterday, but unclear accuracy given tx.  Ileostomy with 1225cc yesterday documented.  Midline wound is clean and packed.  Stoma is viable  Lab Results:  Recent Labs    01/23/24 0505 01/24/24 0552  WBC 18.5* 23.9*  HGB 9.0* 8.5*  HCT 28.5* 27.5*  PLT 227 405*   BMET Recent Labs    01/24/24 0552 01/25/24 0540  NA 138 140  K 3.5 3.6  CL 103 106  CO2 25 22  GLUCOSE 144* 153*  BUN 57* 81*  CREATININE 3.89* 5.21*  CALCIUM  7.4* 7.6*   PT/INR Recent Labs    01/24/24 0552  LABPROT 14.8  INR 1.1   CMP     Component Value Date/Time   NA 140 01/25/2024 0540   NA 141 01/22/2020 1137   K 3.6 01/25/2024 0540   CL 106 01/25/2024 0540   CO2 22 01/25/2024 0540   GLUCOSE 153 (H) 01/25/2024 0540   BUN 81 (H) 01/25/2024 0540   BUN 13 01/22/2020 1137   CREATININE 5.21 (H) 01/25/2024 0540   CREATININE 0.98 06/14/2017 0935   CALCIUM  7.6 (L) 01/25/2024 0540   PROT 5.4 (L) 01/24/2024 0552   PROT 7.3 01/22/2020 1137   ALBUMIN  <1.5 (L) 01/25/2024 0540   ALBUMIN  4.2 01/22/2020 1137   AST 41 01/24/2024 0552   ALT 118 (H) 01/24/2024 0552   ALKPHOS 63 01/24/2024 0552   BILITOT 1.8 (H) 01/24/2024 0552   BILITOT 0.4 01/22/2020 1137   GFRNONAA 12 (L) 01/25/2024 0540   GFRNONAA >89 09/17/2014 1253    GFRAA 111 01/22/2020 1137   GFRAA >89 09/17/2014 1253   Lipase     Component Value Date/Time   LIPASE 22 09/28/2019 1109       Studies/Results: CT ABDOMEN PELVIS WO CONTRAST Result Date: 01/24/2024 CLINICAL DATA:  Abdominal abscess EXAM: CT ABDOMEN AND PELVIS WITHOUT CONTRAST TECHNIQUE: Multidetector CT imaging of the abdomen and pelvis was performed following the standard protocol without IV contrast. RADIATION DOSE REDUCTION: This exam was performed according to the departmental dose-optimization program which includes automated exposure control, adjustment of the mA and/or kV according to patient size and/or use of iterative reconstruction technique. COMPARISON:  01/14/2019 FINDINGS: Lower chest: Small pleural effusions, increased on the right since previous. Dependent consolidation/atelectasis throughout the right lower lobe and in the posterior left lower lobe. Blood pool is hypodense compared to the interventricular septum suggesting anemia. Hepatobiliary: New heterogenous low-attenuation poorly marginated parenchymal regions involving primarily the posterior right hepatic lobe, and hepatic segment 4. The gallbladder is nondistended. No biliary ductal dilatation. Pancreas: Unremarkable. No pancreatic ductal dilatation or surrounding inflammatory changes. Spleen: Normal in size without focal abnormality. Adrenals/Urinary Tract: Normal adrenal glands. Symmetric renal contours without urolithiasis or hydronephrosis. Urinary bladder decompressed by Foley catheter. Stomach/Bowel:  Gastric tube passes through decompressed stomach into the third portion of the decompressed duodenum. Contrast material in multiple mildly distended small bowel loops in the left and mid abdomen. Right lower quadrant ileostomy. Partial right colectomy. Distal colon decompressed, with scattered descending diverticula. Vascular/Lymphatic: Right femoral venous catheter to the right common iliac vein. Scattered aortoiliac  calcified plaque without AAA. No abdominal or pelvic adenopathy localized. Reproductive: Prostate is unremarkable. Other: Left lower quadrant surgical drain loops under the liver. Poorly marginated 5.9 cm fluid collection adjacent to the drain inferior to the gallbladder. Edema throughout the mesentery. No definite free air. Musculoskeletal: Lumbar spondylitic change L4-S1. Vertebral endplate spurring at multiple levels in the lower thoracic spine. 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 IMPRESSION: 1. 5.9 cm fluid collection inferior to the gallbladder, adjacent to the surgical drain. 2. New heterogenous low-attenuation poorly marginated parenchymal regions involving primarily the posterior right hepatic lobe, and hepatic segment 4, possibly developing hepatic abscesses but incompletely characterized. Contrast-enhanced CT or MRI may be useful for further characterization. 3. Small pleural effusions, increased on the right since previous. Dependent consolidation/atelectasis throughout the right lower lobe and in the posterior left lower lobe. Electronically Signed   By: Nicoletta Barrier M.D.   On: 01/24/2024 16:36   CT HEAD WO CONTRAST ( ) Result Date: 01/24/2024 CLINICAL DATA:  Encephalopathy EXAM: CT HEAD WITHOUT CONTRAST TECHNIQUE: Contiguous axial images were obtained from the base of the skull through the vertex without intravenous contrast. RADIATION DOSE REDUCTION: This exam was performed according to the departmental dose-optimization program which includes automated exposure control, adjustment of the mA and/or kV according to patient size and/or use of iterative reconstruction technique. COMPARISON:  None Available. FINDINGS: Brain: No mass,hemorrhage or extra-axial collection. Normal appearance of the parenchyma and CSF spaces. Vascular: No hyperdense vessel or unexpected vascular calcification. Skull: The visualized skull base, calvarium and extracranial soft tissues are normal.  Sinuses/Orbits: Chronic right sphenoid sinusitis.  Normal orbits. Other: None. IMPRESSION: 1. No acute intracranial abnormality. 2. Chronic right sphenoid sinusitis. Electronically Signed   By: Juanetta Nordmann M.D.   On: 01/24/2024 01:13    Anti-infectives: Anti-infectives (From admission, onward)    Start     Dose/Rate Route Frequency Ordered Stop   01/18/24 1400  piperacillin -tazobactam (ZOSYN ) IVPB 3.375 g  Status:  Discontinued        3.375 g 12.5 mL/hr over 240 Minutes Intravenous Every 6 hours 01/18/24 1241 01/18/24 1242   01/18/24 1400  piperacillin -tazobactam (ZOSYN ) IVPB 3.375 g        3.375 g 100 mL/hr over 30 Minutes Intravenous Every 6 hours 01/18/24 1242 01/23/24 2046   01/18/24 0000  piperacillin -tazobactam (ZOSYN ) IVPB 2.25 g  Status:  Discontinued        2.25 g 100 mL/hr over 30 Minutes Intravenous Every 6 hours 01/17/24 2120 01/18/24 1241   01/12/24 0200  piperacillin -tazobactam (ZOSYN ) IVPB 3.375 g  Status:  Discontinued        3.375 g 12.5 mL/hr over 240 Minutes Intravenous Every 8 hours 01/11/24 2120 01/17/24 2120   01/11/24 1815  piperacillin -tazobactam (ZOSYN ) IVPB 3.375 g        3.375 g 100 mL/hr over 30 Minutes Intravenous  Once 01/11/24 1806 01/11/24 1921        Assessment/Plan LAPAROTOMY, EXPLORATORY RIGHT COLECTOMY (without anastomosis) PLACEMENT OF ABTHERA WOUND VAC DRAINAGE OF INTRA-ABDOMINAL ABSCESS  4/16 EW   RE-EXPLORATION ABDOMEN, PLACEMENT OF ABTHERA WOUND VAC, SMALL BOWEL RESECTION 01/16/2024 EW   Re-exploration abdomen, small bowel  resection, creation of end ileostomy, abd closure, placement wound vac 4/19 EW  -would like to see what NGT output is today and make sure this is truly downtrending and not high.  If this is the case, then hopefully we can start progressive with oral intake over the next couple of days -cont to monitor ostomy output -cbc not drawn yet today so not sure what WBC or hgb are.  Tmax 100 -currently not on abx, completely  5 days   FEN - NPO/NGT, may have some ice chips VTE - heparin  gtt ID - completed post op abx, WBC 23K yesterday, continue to monitor  Septic shock - off pressors - per CCM ABL anemia - monitor and transfuse as needed(14 u prbc total, 1u cryo, 6u FFP, 1 plt) Coagulopathy - resolved AKI - started CRRT 4/19,  defer to CCM/renal, plan for iHD Acute hypoxemic resp failure - extubated H/o VTE/PE/DVT - scds for now; was on coumadin    LOS: 14 days    Alexander [name] , [facility] Surgery 01/25/2024, 9:38 AM Please see Amion for pager number during day hours 7:00am-4:30pm or 7:00am -11:30am on weekends

## 2024-01-25 NOTE — Progress Notes (Signed)
  RN reported that patient is requesting for oral ginger ale.  Per chart review patient patient had small bowel perforation s/p ex lap with end ileostomy.    Per general surgery need to keep patient n.p.o and continue TPN until return of normal bowel function.  Currently having high output through NG tube. However patient reported that while he was at [facility] long he has been given ice chips by mouth and requesting for ginger ale.  I have informed patient's RN that patient supposed to be complete n.p.o. and nothing by mouth.  Continue NG tube intermittent suction.  Need to verify with general surgery in AM when it would be appropriate to start oral diet.  Anibal Quinby, MD [facility] 01/25/2024, 12:06 AM

## 2024-01-25 NOTE — Plan of Care (Signed)
  Problem: Coping: Goal: Level of anxiety will decrease Outcome: Progressing   Problem: Elimination: Goal: Will not experience complications related to bowel motility Outcome: Progressing Goal: Will not experience complications related to urinary retention Outcome: Progressing   Problem: Pain Managment: Goal: General experience of comfort will improve and/or be controlled Outcome: Progressing   Problem: Safety: Goal: Ability to remain free from injury will improve Outcome: Progressing   Problem: Skin Integrity: Goal: Risk for impaired skin integrity will decrease Outcome: Progressing   Problem: Activity: Goal: Ability to tolerate increased activity will improve Outcome: Progressing

## 2024-01-25 NOTE — Progress Notes (Signed)
 PROGRESS NOTE    Alexander [name]  BMW:[id number] DOB: [birth year] DOA: 01/11/2024 PCP: Alexander Iba, PA  Subjective: Patient seen and examined.  Met with his son, his brother, his best friend and a few other family members at bedside.  Patient transferred over to [facility] from [facility], ICU last night due to continued need for hemodialysis.  He had been on CRRT in the [facility] 2 ICU.  Nephrology is determined he will need intermittent hemodialysis now.  This is not offered at [place] [facility].  Patient initially admitted on 01/11/2024 due to rectal bleeding.  He was initially placed on IV antibiotics.  GI and nephrology were consulted.  He had tremendous blood loss.  He was taken the operating room on 01/15/2024.  He was found to have a colonic perforation.  He underwent right hemicolectomy.  He suffered an acute cardiac arrest postoperatively about 8 hours after his surgery.  He had ROSC after about 4 minutes of CPR.  He was taken back to surgery in the day on  01/16/2024 due to continued massive blood loss.  He had massive transfusion protocol initiated.  He was taken the operating the third time on 01/18/2024 for ileostomy creation.  He suffered worsening renal failure and became oliguric.  Nephrology was consulted on 01/18/2024 and he started CRRT while in the ICU at [facility] long.  He was extubated on 01/22/2024.  His renal recovery has been nonexistent to slow.  Nephrology is determined that he will need intermittent hemodialysis.  He was transferred from [facility], ICU to a progressive bed at [facility] on 01/24/2024 so that hemodialysis could be performed.  Patient remains with high output NG tube and ileostomy output.  He remains on TPN.  He has been off antibiotics for several days now.   Hospital Course: HPI: Alexander [name] is a 63 y.o. male with medical history significant of Pe/DVT on coumadin , diastolic CHF. Presented with  bRBPR for 1 day. Weakness and syncope with blood per  rectum and abd pain. In ER [facility] large BM.  Was tachycardic and elevated WBC CT showed diverticulits.  HGB 8. Was not aware of fever or chills. 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    Reports abd pain has been ongoing for the past few days. Stools more loose today  Significant Events: Admitted 01/11/2024 for GI bleeding 4/16 to OR for ex lap, returned to ICU on vent 4/16 cardiorespiratory arrest, achieved ROSC.  Coagulopathy 4/17 blood products + multiple pressors  4/18 back to the OR 4/19 encephalopathic. Back to the OR this morning-s/p exploratory laparotomy, ileostomy creation, small bowel resection, wound VAC in place 01-18-2024 nephrology consulted for CRRT 4/21 weaning pressors  4/23 extubated; off pressors 01-24-2024 Transferred from [facility] ICU to [facility] progressive due to need for hemodialysis.  Antibiotic Therapy: Anti-infectives (From admission, onward)    Start     Dose/Rate Route Frequency Ordered Stop   01/18/24 1400  piperacillin -tazobactam (ZOSYN ) IVPB 3.375 g  Status:  Discontinued        3.375 g 12.5 mL/hr over 240 Minutes Intravenous Every 6 hours 01/18/24 1241 01/18/24 1242   01/18/24 1400  piperacillin -tazobactam (ZOSYN ) IVPB 3.375 g        3.375 g 100 mL/hr over 30 Minutes Intravenous Every 6 hours 01/18/24 1242 01/23/24 2046   01/18/24 0000  piperacillin -tazobactam (ZOSYN ) IVPB 2.25 g  Status:  Discontinued        2.25 g 100 mL/hr over 30  Minutes Intravenous Every 6 hours 01/17/24 2120 01/18/24 1241   01/12/24 0200  piperacillin -tazobactam (ZOSYN ) IVPB 3.375 g  Status:  Discontinued        3.375 g 12.5 mL/hr over 240 Minutes Intravenous Every 8 hours 01/11/24 2120 01/17/24 2120   01/11/24 1815  piperacillin -tazobactam (ZOSYN ) IVPB 3.375 g        3.375 g 100 mL/hr over 30 Minutes Intravenous  Once 01/11/24 1806 01/11/24 1921       Procedures: 01-15-2024 right hemicolectomy 01-16-2024 small bowel resection 01-18-2024 ileostomy, small bowel  resection 01-18-2024 femoral HD catheter insertion  Consultants: PCCM GI General Surgery Nephrology    Assessment and Plan: * Spontaneous perforation of colon (HCC) 01-25-2024 s/p multiple laparotomies.  S/p right hemicolectomy, small bowel resection and ileostomy creation.  Pt remains with high output NG tube and ileostomy drainage. Remains on IV TPN.  AKI (acute kidney injury) (HCC) 01-25-2024 likely caused by a combination of acute blood loss anemia, hypotension due to cardiac arrest. Needed CRRT from 01-18-2024 through 01-24-2024. Pt transferred from [facility] ICU over to [facility] progressive bed in order to receive continued HD. Nephrology following. Hopeful for renal recovery.  Coagulopathy (HCC) 01-25-2024 due to coumadin , colonic perforation. Now resolved.  Acute hypoxic respiratory failure (HCC) 01-25-2024 was extubated on 01-22-2024. Currently on RA.  Rectal bleeding 01-25-2024 due to perforated colon and elevated INR. Now resolved.   Acute blood loss anemia 01-25-2024 no longer bleeding. Due to elevated INR from coumadin , perforated colon. S/p 13 units PRBC since admission.  Hematochezia 01-25-2024 due to perforated colon and elevated INR. Now resolved.  Diverticulitis 01-25-2024 pt has completed IV abx.  History of pulmonary embolism 01-25-2024 was on coumadin  at home. Currently on IV heparin .  Essential hypertension, benign 01-25-2024 off IV vasopressors. Prn IV labetalol   History of pulmonary embolus (PE) 01-25-2024 remains on IV heparin  gtts.  Resolved Hospital Problems Severe sepsis (HCC)-resolved as of 01/14/2024 - Fever, tachycardia, tachypnea, leukocytosis.  Presumed abdominal infection with suspected diverticulitis - Continue Zosyn  - stable now over past 24 hrs after tx to ICU  ABLA (acute blood loss anemia) - Due to ongoing rectal bleeding from presumed diverticulitis at this time - Has now required 5 units PRBCs since admission -Hgb 7.7 g/dL this  morning -Will continue trending and transfuse as necessary  Iron  deficiency anemia - Folate normal, B12 severely low (93) -Ferritin low and iron  labs also low.  Venofer  repletion ordered x 3 days - Also repleting B12   DVT prophylaxis: SCD's Start: 01/15/24 1400  IV Heparin    Code Status: Full Code Family Communication: discussed with pt, pt's son Darius and several family members/friends Disposition Plan: unknown Reason for continuing need for hospitalization: remains on IV TPN, NG tube suction, HD.  Objective: Vitals:   01/25/24 0300 01/25/24 0350 01/25/24 0725 01/25/24 1121  BP: (!) 143/76  (!) 150/72 (!) 152/74  Pulse: 86     Resp: 14     Temp: 97.9 F (36.6 C)  98.2 F (36.8 C) 98.4 F (36.9 C)  TempSrc: Oral  Axillary Oral  SpO2: 94%     Weight:  78.6 kg    Height:        Intake/Output Summary (Last 24 hours) at 01/25/2024 1409 Last data filed at 01/25/2024 1300 Gross per 24 hour  Intake 566.24 ml  Output 1910 ml  Net -1343.76 ml   Filed Weights   01/24/24 0500 01/24/24 2320 01/25/24 0350  Weight: 77.5 kg 78.1 kg 78.6 kg  Examination:  Physical Exam Vitals and nursing note reviewed.  Constitutional:      General: He is not in acute distress.    Appearance: He is not toxic-appearing.  HENT:     Head: Normocephalic and atraumatic.     Nose:     Comments: +NG tube with bilious drainage Cardiovascular:     Rate and Rhythm: Normal rate and regular rhythm.  Pulmonary:     Effort: Pulmonary effort is normal.     Breath sounds: Normal breath sounds.  Abdominal:     Tenderness: There is no guarding or rebound.     Comments: +ileostomy with bilious drainage.  Musculoskeletal:     Comments: Right internal jugular CVL  Right groin HD catheter  Skin:    General: Skin is warm and dry.     Capillary Refill: Capillary refill takes less than 2 seconds.  Neurological:     Mental Status: He is alert.     Data Reviewed: I have personally reviewed  following labs and imaging studies  CBC: Recent Labs  Lab 01/19/24 1018 01/20/24 0427 01/21/24 0504 01/22/24 1033 01/23/24 0505 01/24/24 0552  WBC  --  14.9*  --  15.8* 18.5* 23.9*  HGB 9.6* 9.8*  --  9.3* 9.0* 8.5*  HCT 28.8* 29.3*  --  30.5* 28.5* 27.5*  MCV  --  89.1  --  95.9 94.4 95.5  PLT  --  103*  103* 93* 139* 227 405*   Basic Metabolic Panel: Recent Labs  Lab 01/21/24 0504 01/21/24 1729 01/22/24 0417 01/22/24 1557 01/23/24 0503 01/23/24 0505 01/24/24 0552 01/25/24 0540  NA 134*   < > 138 138 135 134* 138 140  K 4.3   < > 4.1 3.8 3.6 3.6 3.5 3.6  CL 100   < > 103 104 104 102 103 106  CO2 26   < > 26 26 24 24 25 22   GLUCOSE 134*   < > 137* 136* 155* 154* 144* 153*  BUN 40*   < > 36* 33* 32* 32* 57* 81*  CREATININE 2.40*   < > 2.13* 2.12* 1.90* 1.94* 3.89* 5.21*  CALCIUM  7.1*   < > 7.3* 7.2* 7.2* 7.1* 7.4* 7.6*  MG 2.7*  --  2.5*  --   --  2.5* 2.3 2.2  PHOS 3.9   < > 2.2* 3.2 2.6 2.7 4.5 4.7*   < > = values in this interval not displayed.   GFR: Estimated Creatinine Clearance: 14.9 mL/min (A) (by C-G formula based on SCr of 5.21 mg/dL (H)). Liver Function Tests: Recent Labs  Lab 01/20/24 0427 01/20/24 1524 01/21/24 0504 01/21/24 1729 01/22/24 0417 01/22/24 1557 01/23/24 0503 01/23/24 0505 01/24/24 0552 01/25/24 0540  AST 340*  --  146*  --  85*  --   --  57* 41  --   ALT 666*  --  415*  --  265*  --   --  179* 118*  --   ALKPHOS 95  --  83  --  78  --   --  69 63  --   BILITOT 2.6*  --  1.9*  --  2.2*  --   --  2.2* 1.8*  --   PROT 5.0*  --  5.1*  --  5.1*  --   --  5.4* 5.4*  --   ALBUMIN  1.8*  1.9*   < > 1.7*  1.7*   < > 1.7*  1.7* 1.6* 1.7* 1.7* 1.5*  1.6* <1.5*   < > = values in this interval not displayed.   Coagulation Profile: Recent Labs  Lab 01/19/24 0541 01/20/24 0427 01/21/24 0504 01/24/24 0552  INR 1.1 1.0 1.0 1.1   CBG: Recent Labs  Lab 01/24/24 1117 01/24/24 1801 01/25/24 0015 01/25/24 0621 01/25/24 1123   GLUCAP 116* 111* 130* 157* 124*   Radiology Studies: CT ABDOMEN PELVIS WO CONTRAST Result Date: 01/24/2024 CLINICAL DATA:  Abdominal abscess EXAM: CT ABDOMEN AND PELVIS WITHOUT CONTRAST TECHNIQUE: Multidetector CT imaging of the abdomen and pelvis was performed following the standard protocol without IV contrast. RADIATION DOSE REDUCTION: This exam was performed according to the departmental dose-optimization program which includes automated exposure control, adjustment of the mA and/or kV according to patient size and/or use of iterative reconstruction technique. COMPARISON:  01/14/2019 FINDINGS: Lower chest: Small pleural effusions, increased on the right since previous. Dependent consolidation/atelectasis throughout the right lower lobe and in the posterior left lower lobe. Blood pool is hypodense compared to the interventricular septum suggesting anemia. Hepatobiliary: New heterogenous low-attenuation poorly marginated parenchymal regions involving primarily the posterior right hepatic lobe, and hepatic segment 4. The gallbladder is nondistended. No biliary ductal dilatation. Pancreas: Unremarkable. No pancreatic ductal dilatation or surrounding inflammatory changes. Spleen: Normal in size without focal abnormality. Adrenals/Urinary Tract: Normal adrenal glands. Symmetric renal contours without urolithiasis or hydronephrosis. Urinary bladder decompressed by Foley catheter. Stomach/Bowel: Gastric tube passes through decompressed stomach into the third portion of the decompressed duodenum. Contrast material in multiple mildly distended small bowel loops in the left and mid abdomen. Right lower quadrant ileostomy. Partial right colectomy. Distal colon decompressed, with scattered descending diverticula. Vascular/Lymphatic: Right femoral venous catheter to the right common iliac vein. Scattered aortoiliac calcified plaque without AAA. No abdominal or pelvic adenopathy localized. Reproductive: Prostate is  unremarkable. Other: Left lower quadrant surgical drain loops under the liver. Poorly marginated 5.9 cm fluid collection adjacent to the drain inferior to the gallbladder. Edema throughout the mesentery. No definite free air. Musculoskeletal: Lumbar spondylitic change L4-S1. Vertebral endplate spurring at multiple levels in the lower thoracic spine. 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 IMPRESSION: 1. 5.9 cm fluid collection inferior to the gallbladder, adjacent to the surgical drain. 2. New heterogenous low-attenuation poorly marginated parenchymal regions involving primarily the posterior right hepatic lobe, and hepatic segment 4, possibly developing hepatic abscesses but incompletely characterized. Contrast-enhanced CT or MRI may be useful for further characterization. 3. Small pleural effusions, increased on the right since previous. Dependent consolidation/atelectasis throughout the right lower lobe and in the posterior left lower lobe. Electronically Signed   By: Nicoletta Barrier M.D.   On: 01/24/2024 16:36   CT HEAD WO CONTRAST ( ) Result Date: 01/24/2024 CLINICAL DATA:  Encephalopathy EXAM: CT HEAD WITHOUT CONTRAST TECHNIQUE: Contiguous axial images were obtained from the base of the skull through the vertex without intravenous contrast. RADIATION DOSE REDUCTION: This exam was performed according to the departmental dose-optimization program which includes automated exposure control, adjustment of the mA and/or kV according to patient size and/or use of iterative reconstruction technique. COMPARISON:  None Available. FINDINGS: Brain: No mass,hemorrhage or extra-axial collection. Normal appearance of the parenchyma and CSF spaces. Vascular: No hyperdense vessel or unexpected vascular calcification. Skull: The visualized skull base, calvarium and extracranial soft tissues are normal. Sinuses/Orbits: Chronic right sphenoid sinusitis.  Normal orbits. Other: None. IMPRESSION: 1. No acute  intracranial abnormality. 2. Chronic right sphenoid sinusitis. Electronically Signed   By: Juanetta Nordmann M.D.   On: 01/24/2024  01:13    Scheduled Meds:  Chlorhexidine  Gluconate Cloth  6 each Topical Daily   insulin  aspart  0-15 Units Subcutaneous Q6H   mouth rinse  15 mL Mouth Rinse 4 times per day   pantoprazole  (PROTONIX ) IV  40 mg Intravenous Q12H   Continuous Infusions:  sodium chloride  10 mL/hr at 01/24/24 0700   heparin  1,850 Units/hr (01/25/24 1211)   TPN ADULT (ION) 90 mL/hr at 01/24/24 1823   TPN ADULT (ION)       LOS: 14 days   Time spent: 50 minutes  Unk Garb, DO  [facility]  01/25/2024, 2:09 PM

## 2024-01-25 NOTE — Assessment & Plan Note (Signed)
 01-25-2024 s/p multiple laparotomies.  S/p right hemicolectomy, small bowel resection and ileostomy creation.  Pt remains with high output NG tube and ileostomy drainage. Remains on IV TPN. Patient transferred over to [facility] from [facility], ICU last night due to continued need for hemodialysis.  He had been on CRRT in the [facility] ICU.  Nephrology is determined he will need intermittent hemodialysis now.  This is not offered at [place] [facility]. Patient initially admitted on 01/11/2024 due to rectal bleeding.  He was initially placed on IV antibiotics.  GI and nephrology were consulted.  He had tremendous blood loss.  He was taken the operating room on 01/15/2024.  He was found to have a colonic perforation.  He underwent right hemicolectomy. He suffered an acute cardiac arrest postoperatively about 8 hours after his surgery.  He had ROSC after about 4 minutes of CPR. He was taken back to surgery in the day on  01/16/2024 due to continued massive blood loss.  He had massive transfusion protocol initiated. He was taken the operating the third time on 01/18/2024 for ileostomy creation. He suffered worsening renal failure and became oliguric.  Nephrology was consulted on 01/18/2024 and he started CRRT while in the ICU at [facility]. He was extubated on 01/22/2024.  His renal recovery has been nonexistent to slow. Nephrology is determined that he will need intermittent hemodialysis.  He was transferred from [facility], ICU to a progressive bed at [facility] on 01/24/2024 so that hemodialysis could be performed. Patient remains with high output NG tube and ileostomy output.  He remains on TPN.  He has been off antibiotics for several days now.  01-26-2024 surgery team aware of his leukocytosis of 24K.  Fluid collection noted on CT abd. Defer to surgery to deal with fluid collection. Remains off IV ABX per surgery.  Appears surgery team going to clamp his NG tube today and see how he does.  Hopefully he can get  his NG tube out and start diet soon.  Continue with TPN for now.  01-27-2024 continue TPN for now. RN reports that general surgery is going to order NG tube to be removed.  Pt is on clear liquid diet. Defer to general surgery service to advance diet and stop TPN.  Surgery service needs to address the fluid collection seen on CT abd from 01-24-2024.  ??repositioning of abd drain?? Pt remains off IV ABX. WBC at 26K. Has been >20K for 3 days now.  01-28-2024 discussed with general surgery. Will restart IV zosyn . Surgery service ordering another CT abd, potential for repositioning/insertion of abd drain to help with persistent abd fluid collection. Right internal jugular CVL removed yesterday. Asking nephrology if right femoral HD catheter can be removed today.

## 2024-01-25 NOTE — Progress Notes (Addendum)
 Interventional Radiology Brief Note:  Case reviewed Dr. Darylene Epley, no role for IR at this time.   Recommend ongoing care with monitoring of clinical status with re-imaging if not improved over the next few days. Reconsult if needed.   Adrijana Haros, MS RD PA-C

## 2024-01-26 DIAGNOSIS — D62 Acute posthemorrhagic anemia: Secondary | ICD-10-CM | POA: Diagnosis not present

## 2024-01-26 DIAGNOSIS — N179 Acute kidney failure, unspecified: Secondary | ICD-10-CM | POA: Diagnosis not present

## 2024-01-26 DIAGNOSIS — K631 Perforation of intestine (nontraumatic): Secondary | ICD-10-CM | POA: Diagnosis not present

## 2024-01-26 DIAGNOSIS — Z86711 Personal history of pulmonary embolism: Secondary | ICD-10-CM | POA: Diagnosis not present

## 2024-01-26 LAB — GLUCOSE, CAPILLARY
Glucose-Capillary: 111 mg/dL — ABNORMAL HIGH (ref 70–99)
Glucose-Capillary: 125 mg/dL — ABNORMAL HIGH (ref 70–99)
Glucose-Capillary: 139 mg/dL — ABNORMAL HIGH (ref 70–99)
Glucose-Capillary: 140 mg/dL — ABNORMAL HIGH (ref 70–99)

## 2024-01-26 LAB — RENAL FUNCTION PANEL
Albumin: <1.5 g/dL — ABNORMAL LOW (ref 3.5–5.0)
Anion gap: 16 — ABNORMAL HIGH (ref 5–15)
BUN: 97 mg/dL — ABNORMAL HIGH (ref 8–23)
CO2: 20 mmol/L — ABNORMAL LOW (ref 22–32)
Calcium: 7.7 mg/dL — ABNORMAL LOW (ref 8.9–10.3)
Chloride: 106 mmol/L (ref 98–111)
Creatinine, Ser: 6.11 mg/dL — ABNORMAL HIGH (ref 0.61–1.24)
GFR, Estimated: 10 mL/min — ABNORMAL LOW (ref >60)
Glucose, Bld: 152 mg/dL — ABNORMAL HIGH (ref 70–99)
Phosphorus: 4.5 mg/dL (ref 2.5–4.6)
Potassium: 3.4 mmol/L — ABNORMAL LOW (ref 3.5–5.1)
Sodium: 142 mmol/L (ref 135–145)

## 2024-01-26 LAB — CBC
HCT: 24.8 % — ABNORMAL LOW (ref 39.0–52.0)
Hemoglobin: 7.9 g/dL — ABNORMAL LOW (ref 13.0–17.0)
MCH: 29.5 pg (ref 26.0–34.0)
MCHC: 31.9 g/dL (ref 30.0–36.0)
MCV: 92.5 fL (ref 80.0–100.0)
Platelets: 641 10*3/uL — ABNORMAL HIGH (ref 150–400)
RBC: 2.68 MIL/uL — ABNORMAL LOW (ref 4.22–5.81)
RDW: 19.7 % — ABNORMAL HIGH (ref 11.5–15.5)
WBC: 24.6 10*3/uL — ABNORMAL HIGH (ref 4.0–10.5)
nRBC: 0 % (ref 0.0–0.2)

## 2024-01-26 LAB — HEPARIN LEVEL (UNFRACTIONATED)
Heparin Unfractionated: 0.3 [IU]/mL (ref 0.30–0.70)
Heparin Unfractionated: 0.32 [IU]/mL (ref 0.30–0.70)

## 2024-01-26 LAB — PROTIME-INR
INR: 1.2 (ref 0.8–1.2)
Prothrombin Time: 15.6 s — ABNORMAL HIGH (ref 11.4–15.2)

## 2024-01-26 LAB — HEPATITIS B SURFACE ANTIBODY, QUANTITATIVE: Hep B S AB Quant (Post): 341 m[IU]/mL

## 2024-01-26 LAB — MAGNESIUM: Magnesium: 2.1 mg/dL (ref 1.7–2.4)

## 2024-01-26 MED ORDER — POTASSIUM CHLORIDE 10 MEQ/50ML IV SOLN
10.0000 meq | INTRAVENOUS | Status: AC
  Administered 2024-01-26 (×4): 10 meq via INTRAVENOUS
  Filled 2024-01-26 (×4): qty 50

## 2024-01-26 MED ORDER — TRACE MINERALS CU-MN-SE-ZN 300-55-60-3000 MCG/ML IV SOLN
INTRAVENOUS | Status: DC
  Filled 2024-01-26: qty 720

## 2024-01-26 MED ORDER — HEPARIN SODIUM (PORCINE) 1000 UNIT/ML IJ SOLN
1000.0000 [IU] | Freq: Once | INTRAMUSCULAR | Status: AC
  Administered 2024-01-26: 1000 [IU]

## 2024-01-26 MED ORDER — HEPARIN SODIUM (PORCINE) 1000 UNIT/ML IJ SOLN
INTRAMUSCULAR | Status: AC
  Filled 2024-01-26: qty 4

## 2024-01-26 MED ORDER — HYDROMORPHONE HCL 1 MG/ML IJ SOLN
INTRAMUSCULAR | Status: AC
  Filled 2024-01-26: qty 0.5

## 2024-01-26 NOTE — Progress Notes (Signed)
 PHARMACY - TOTAL PARENTERAL NUTRITION CONSULT NOTE   Indication:  intolerance to enteral nutrition  Patient Measurements: Height: 5\' 9"  (175.3 cm) Weight: 78.7 kg (173 lb 8 oz) IBW/kg (Calculated) : 70.7 TPN AdjBW (KG): 78.1 Body mass index is 25.62 kg/m.  Assessment: 63 year old male intubated and sedated in the ICU. S/p multiple surgical procedures as detailed below. Patient with brief cardiac arrest 4/16 requiring 4 vasopressors and stress dose steroids. His vasopressor requirements have decreased. Decline in renal function with minimal UOP started on CRRT 4/19. Pharmacy to start TPN as patient is currently not a candidate for enteral feeding yet.  Glucose / Insulin : no hx DM, CBG <180 on mSSI, 6 units used in 24h Electrolytes: K 3.4 s/p 2 runs, Mg 2.1, Phos 4.5, others WNL Required aggressive Ca replacement earlier in admission Renal: CRRT 4/19 >> 4/24. Monitor for renal recovery, HD session planned for 4/27. Nephrology following. Hepatic: LFTs trending down (shock liver), albumin  <1.5, Tbili elevated but trending down, TG 230 on 4/21 Intake / Output; MIVF: UOP up 1050 mL, NG output up 550 mL, stool output down 70 mL, drain output down 25 mL GI Imaging: 4/15 CT A/P: 1. Persistent heterogeneous wall thickening of the right colon with surrounding inflammation and suspected enlarging contained perforation along the medial aspect of the ascending colon. As before, this may be secondary to diverticulitis, colitis or neoplasm. No organized fluid collection or pneumoperitoneum. General surgical consultation recommended. 2. Interval increased gastric distension with diffuse small and large bowel distension, most consistent with an ileus. 4/25 CT A/P: 5.9 cm fluid collection inferior to the gallbladder, possibly developing hepatic abscesses but incompletely characterized   GI Surgeries / Procedures:  4/19 Re-exploration abdomen, small bowel resection, creation of end ileostomy, abd closure and  placement of wound vac 4/17 Re-exploration abdomen, small bowel resection 4/16 Right colectomy, drainage of abscess 4/19 ex lap, end ileostomy excision, wound vac  Central access: CVC TPN start date: 4/21  Nutritional Goals: -Concentrated TPN to attempt to reduce volume -In response to new target goals, TPN at goal rate of 90 mL/hr will provide 108 g protein, 2181 kcal  RD Assessment: (recalculated 4/24 after CRRT stopped)  Estimated Needs Total Energy Estimated Needs: 2000-2250 kcals Total Protein Estimated Needs: 100-115 grams Total Fluid Estimated Needs: >/= 2L  Current Nutrition:  4/27 CLD and TPN  Plan: Continue concentrated TPN at goal 10mL/hr at 1800 Electrolytes in TPN: Na 177mEq/L, incr K 10mEq/L, Ca 66mEq/L, Mg 13mEq/L, and Phos 74mmol/L. Cl:Ac 1:1 Add standard MVI and trace elements to TPN Thiamine  100 mg IV daily x 5 days (4/26 >> 4/30)  KCl 10 mEq IV q1h x4 and add to TPN - discussed with Dr Cindra Cree Continue Moderate q6h SSI and adjust as needed  Monitor TPN labs on Mon/Thurs, renal function panel + Mg ordered daily F/u NGT clamping trial  Thank you for involving pharmacy in this patient's care.  Caroline Cinnamon, PharmD, BCPS Clinical Pharmacist Clinical phone for 01/26/2024 is [phone] 01/26/2024 7:14 AM
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
## 2024-01-26 NOTE — Progress Notes (Signed)
 Removed Flexiseal (Fecal Management tube) this morning. No output for 48 hrs. Patient tolerated it well.

## 2024-01-26 NOTE — Progress Notes (Signed)
 [facility] NEPHROLOGY PROGRESS NOTE  Assessment/ Plan: Pt is a 63 y.o. yo male with past medical history of DVT, PE on Coumadin  admitted on 4/12 with rectal bleed and hypotension seen for AKI.  # AKI - mostly anuric, creat 1.0 early in this hospital stay. After onset of severe shock 4/16 pt had progressive AKI. UA negative. Renal US  shows no obstruction. AKI due to ATN primarily due to shock +/- IV contrast.  CRRT from 4/19-4/24. Urine output slowly increasing but creatinine rising.  Showing some moderate signs of improvement - Plan for dialysis today - Reassess for dialysis needs daily - Consider tunneled dialysis catheter if patient has ongoing renal replacement therapy needs  #S/P cardiac arrest: 4/17  #Shock/ hypotension: Off of pressors now.  # Acute diverticulitis: w/ perforated ascending colon, s/p ileostomy, R hemicolectomy and some small bowel resection. Per gen surg/ pmd.  Advancing diet as able.  Per general surgery.  On TPN  #VDRF: Improved now extubated    Subjective: Patient states he feels great.  Continues to be disoriented.  Wants to drink water .  Did not get dialysis yesterday because of census.  Planning to get dialysis today  Objective Vital signs in last 24 hours: Vitals:   01/25/24 2300 01/26/24 0309 01/26/24 0620 01/26/24 0736  BP: (!) 157/79 (!) 160/80  (!) 159/78  Pulse: 89 83    Resp: 17 15    Temp: 98.8 F (37.1 C) 98 F (36.7 C)  97.9 F (36.6 C)  TempSrc: Oral Oral  Oral  SpO2: 91% 92%    Weight:   78.7 kg   Height:       Weight change: 0.6 kg  Intake/Output Summary (Last 24 hours) at 01/26/2024 0749 Last data filed at 01/26/2024 0618 Gross per 24 hour  Intake 2760.63 ml  Output 1695 ml  Net 1065.63 ml       Labs: RENAL PANEL Recent Labs  Lab 01/22/24 0417 01/22/24 1557 01/23/24 0503 01/23/24 0505 01/24/24 0552 01/25/24 0540 01/26/24 0610  NA 138   < > 135 134* 138 140 142  K 4.1   < > 3.6 3.6 3.5 3.6 3.4*   CL 103   < > 104 102 103 106 106  CO2 26   < > 24 24 25 22  20*  GLUCOSE 137*   < > 155* 154* 144* 153* 152*  BUN 36*   < > 32* 32* 57* 81* 97*  CREATININE 2.13*   < > 1.90* 1.94* 3.89* 5.21* 6.11*  CALCIUM  7.3*   < > 7.2* 7.1* 7.4* 7.6* 7.7*  MG 2.5*  --   --  2.5* 2.3 2.2 2.1  PHOS 2.2*   < > 2.6 2.7 4.5 4.7* 4.5  ALBUMIN  1.7*  1.7*   < > 1.7* 1.7* 1.5*  1.6* <1.5* <1.5*   < > = values in this interval not displayed.    Liver Function Tests: Recent Labs  Lab 01/22/24 0417 01/22/24 1557 01/23/24 0505 01/24/24 0552 01/25/24 0540 01/26/24 0610  AST 85*  --  57* 41  --   --   ALT 265*  --  179* 118*  --   --   ALKPHOS 78  --  69 63  --   --   BILITOT 2.2*  --  2.2* 1.8*  --   --   PROT 5.1*  --  5.4* 5.4*  --   --   ALBUMIN  1.7*  1.7*   < > 1.7* 1.5*  1.6* <1.5* <1.5*   < > = values in this interval not displayed.   No results for input(s): "LIPASE", "AMYLASE" in the last 168 hours. No results for input(s): "AMMONIA" in the last 168 hours. CBC: Recent Labs    07/19/23 0448 07/22/23 0441 12/03/23 1437 12/04/23 1531 12/05/23 0036 01/11/24 1458 01/11/24 1957 01/12/24 0056 01/12/24 1044 01/12/24 1552 01/20/24 0427 01/22/24 1033 01/23/24 0505 01/24/24 0552 01/26/24 0610  HGB 10.1*   < > 7.6 Repeated and verified X2.*   < >  --    < > 6.7*   < >  --    < > 9.8* 9.3* 9.0* 8.5* 7.9*  MCV 92.7   < > 75.5*   < >  --    < > 76.6*   < >  --    < > 89.1 95.9 94.4 95.5 92.5  VITAMINB12 222  --   --   --  100*  --   --   --  93*  --   --   --   --   --   --   FOLATE 7.5  --   --   --  8.0  --   --   --  9.3  --   --   --   --   --   --   FERRITIN 25  --  4.5*  --  3*  --  19*  --   --   --   --   --   --   --   --   TIBC 328  --  382.2  --  364  --  293  --   --   --   --   --   --   --   --   IRON  25*  --  15*  --  11*  --  11*  --   --   --   --   --   --   --   --   RETICCTPCT 1.4  --   --   --  0.7  --  0.8  --   --   --   --   --   --   --   --    < > = values in  this interval not displayed.    Cardiac Enzymes: No results for input(s): "CKTOTAL", "CKMB", "CKMBINDEX", "TROPONINI" in the last 168 hours. CBG: Recent Labs  Lab 01/25/24 0621 01/25/24 1123 01/25/24 1625 01/26/24 0055 01/26/24 0606  GLUCAP 157* 124* 130* 111* 125*    Iron  Studies: No results for input(s): "IRON ", "TIBC", "TRANSFERRIN", "FERRITIN" in the last 72 hours. Studies/Results: CT ABDOMEN PELVIS WO CONTRAST Result Date: 01/24/2024 CLINICAL DATA:  Abdominal abscess EXAM: CT ABDOMEN AND PELVIS WITHOUT CONTRAST TECHNIQUE: Multidetector CT imaging of the abdomen and pelvis was performed following the standard protocol without IV contrast. RADIATION DOSE REDUCTION: This exam was performed according to the departmental dose-optimization program which includes automated exposure control, adjustment of the mA and/or kV according to patient size and/or use of iterative reconstruction technique. COMPARISON:  01/14/2019 FINDINGS: Lower chest: Small pleural effusions, increased on the right since previous. Dependent consolidation/atelectasis throughout the right lower lobe and in the posterior left lower lobe. Blood pool is hypodense compared to the interventricular septum suggesting anemia. Hepatobiliary: New heterogenous low-attenuation poorly marginated parenchymal regions involving primarily the posterior right hepatic lobe, and hepatic segment 4. The gallbladder is nondistended. No biliary ductal dilatation. Pancreas:  Unremarkable. No pancreatic ductal dilatation or surrounding inflammatory changes. Spleen: Normal in size without focal abnormality. Adrenals/Urinary Tract: Normal adrenal glands. Symmetric renal contours without urolithiasis or hydronephrosis. Urinary bladder decompressed by Foley catheter. Stomach/Bowel: Gastric tube passes through decompressed stomach into the third portion of the decompressed duodenum. Contrast material in multiple mildly distended small bowel loops in the left  and mid abdomen. Right lower quadrant ileostomy. Partial right colectomy. Distal colon decompressed, with scattered descending diverticula. Vascular/Lymphatic: Right femoral venous catheter to the right common iliac vein. Scattered aortoiliac calcified plaque without AAA. No abdominal or pelvic adenopathy localized. Reproductive: Prostate is unremarkable. Other: Left lower quadrant surgical drain loops under the liver. Poorly marginated 5.9 cm fluid collection adjacent to the drain inferior to the gallbladder. Edema throughout the mesentery. No definite free air. Musculoskeletal: Lumbar spondylitic change L4-S1. Vertebral endplate spurring at multiple levels in the lower thoracic spine. 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 IMPRESSION: 1. 5.9 cm fluid collection inferior to the gallbladder, adjacent to the surgical drain. 2. New heterogenous low-attenuation poorly marginated parenchymal regions involving primarily the posterior right hepatic lobe, and hepatic segment 4, possibly developing hepatic abscesses but incompletely characterized. Contrast-enhanced CT or MRI may be useful for further characterization. 3. Small pleural effusions, increased on the right since previous. Dependent consolidation/atelectasis throughout the right lower lobe and in the posterior left lower lobe. Electronically Signed   By: Nicoletta Barrier M.D.   On: 01/24/2024 16:36     Medications: Infusions:  sodium chloride  10 mL/hr at 01/24/24 0700   heparin  1,950 Units/hr (01/26/24 0057)   potassium chloride      TPN ADULT (ION) 90 mL/hr at 01/25/24 1738    Scheduled Medications:  Chlorhexidine  Gluconate Cloth  6 each Topical Daily   insulin  aspart  0-15 Units Subcutaneous Q6H   mouth rinse  15 mL Mouth Rinse 4 times per day   pantoprazole  (PROTONIX ) IV  40 mg Intravenous Q12H    have reviewed scheduled and prn medications.  Physical Exam: GEN: sitting in bed, nad ENT: no nasal discharge, mmm EYES: no  scleral icterus, eomi CV: normal rate, no murmurs PULM: no iwob, bilateral chest rise ABD: NABS, non-distended SKIN: no rashes or jaundice EXT: 1+ edema, warm and well perfused   Alexander [name] 01/26/2024,7:49 AM  LOS: 15 days

## 2024-01-26 NOTE — Progress Notes (Addendum)
 8 Days Post-Op  Subjective: Feeling much better today.  No new complaints.   Objective: Vital signs in last 24 hours: Temp:  [97.9 F (36.6 C)-98.8 F (37.1 C)] 97.9 F (36.6 C) (04/27 0736) Pulse Rate:  [83-89] 83 (04/27 0309) Resp:  [15-20] 15 (04/27 0309) BP: (148-160)/(74-83) 159/78 (04/27 0736) SpO2:  [91 %-96 %] 92 % (04/27 0309) Weight:  [78.7 kg] 78.7 kg (04/27 0620) Last BM Date : 01/21/24  Intake/Output from previous day: 04/26 0701 - 04/27 0700 In: 2760.6 [P.O.:10; I.V.:2520.6; IV Piggyback:200] Out: 1695 [Urine:1050; Emesis/NG output:550; Drains:25; Stool:70] Intake/Output this shift: No intake/output data recorded.  PE: Abd: soft, NGT with only 550cc out in last 24 hrs.  Ileostomy viable, working well with succus present. Midline wound is clean and packed.  Stoma is viable.  JP in place, 25 cc of output  Lab Results:  Recent Labs    01/24/24 0552 01/26/24 0610  WBC 23.9* 24.6*  HGB 8.5* 7.9*  HCT 27.5* 24.8*  PLT 405* 641*   BMET Recent Labs    01/25/24 0540 01/26/24 0610  NA 140 142  K 3.6 3.4*  CL 106 106  CO2 22 20*  GLUCOSE 153* 152*  BUN 81* 97*  CREATININE 5.21* 6.11*  CALCIUM  7.6* 7.7*   PT/INR Recent Labs    01/24/24 0552 01/26/24 0610  LABPROT 14.8 15.6*  INR 1.1 1.2   CMP     Component Value Date/Time   NA 142 01/26/2024 0610   NA 141 01/22/2020 1137   K 3.4 (L) 01/26/2024 0610   CL 106 01/26/2024 0610   CO2 20 (L) 01/26/2024 0610   GLUCOSE 152 (H) 01/26/2024 0610   BUN 97 (H) 01/26/2024 0610   BUN 13 01/22/2020 1137   CREATININE 6.11 (H) 01/26/2024 0610   CREATININE 0.98 06/14/2017 0935   CALCIUM  7.7 (L) 01/26/2024 0610   PROT 5.4 (L) 01/24/2024 0552   PROT 7.3 01/22/2020 1137   ALBUMIN  <1.5 (L) 01/26/2024 0610   ALBUMIN  4.2 01/22/2020 1137   AST 41 01/24/2024 0552   ALT 118 (H) 01/24/2024 0552   ALKPHOS 63 01/24/2024 0552   BILITOT 1.8 (H) 01/24/2024 0552   BILITOT 0.4 01/22/2020 1137   GFRNONAA 10 (L)  01/26/2024 0610   GFRNONAA >89 09/17/2014 1253   GFRAA 111 01/22/2020 1137   GFRAA >89 09/17/2014 1253   Lipase     Component Value Date/Time   LIPASE 22 09/28/2019 1109       Studies/Results: CT ABDOMEN PELVIS WO CONTRAST Result Date: 01/24/2024 CLINICAL DATA:  Abdominal abscess EXAM: CT ABDOMEN AND PELVIS WITHOUT CONTRAST TECHNIQUE: Multidetector CT imaging of the abdomen and pelvis was performed following the standard protocol without IV contrast. RADIATION DOSE REDUCTION: This exam was performed according to the departmental dose-optimization program which includes automated exposure control, adjustment of the mA and/or kV according to patient size and/or use of iterative reconstruction technique. COMPARISON:  01/14/2019 FINDINGS: Lower chest: Small pleural effusions, increased on the right since previous. Dependent consolidation/atelectasis throughout the right lower lobe and in the posterior left lower lobe. Blood pool is hypodense compared to the interventricular septum suggesting anemia. Hepatobiliary: New heterogenous low-attenuation poorly marginated parenchymal regions involving primarily the posterior right hepatic lobe, and hepatic segment 4. The gallbladder is nondistended. No biliary ductal dilatation. Pancreas: Unremarkable. No pancreatic ductal dilatation or surrounding inflammatory changes. Spleen: Normal in size without focal abnormality. Adrenals/Urinary Tract: Normal adrenal glands. Symmetric renal contours without urolithiasis or hydronephrosis. Urinary bladder  decompressed by Foley catheter. Stomach/Bowel: Gastric tube passes through decompressed stomach into the third portion of the decompressed duodenum. Contrast material in multiple mildly distended small bowel loops in the left and mid abdomen. Right lower quadrant ileostomy. Partial right colectomy. Distal colon decompressed, with scattered descending diverticula. Vascular/Lymphatic: Right femoral venous catheter to the  right common iliac vein. Scattered aortoiliac calcified plaque without AAA. No abdominal or pelvic adenopathy localized. Reproductive: Prostate is unremarkable. Other: Left lower quadrant surgical drain loops under the liver. Poorly marginated 5.9 cm fluid collection adjacent to the drain inferior to the gallbladder. Edema throughout the mesentery. No definite free air. Musculoskeletal: Lumbar spondylitic change L4-S1. Vertebral endplate spurring at multiple levels in the lower thoracic spine. 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 IMPRESSION: 1. 5.9 cm fluid collection inferior to the gallbladder, adjacent to the surgical drain. 2. New heterogenous low-attenuation poorly marginated parenchymal regions involving primarily the posterior right hepatic lobe, and hepatic segment 4, possibly developing hepatic abscesses but incompletely characterized. Contrast-enhanced CT or MRI may be useful for further characterization. 3. Small pleural effusions, increased on the right since previous. Dependent consolidation/atelectasis throughout the right lower lobe and in the posterior left lower lobe. Electronically Signed   By: Nicoletta Barrier M.D.   On: 01/24/2024 16:36    Anti-infectives: Anti-infectives (From admission, onward)    Start     Dose/Rate Route Frequency Ordered Stop   01/18/24 1400  piperacillin -tazobactam (ZOSYN ) IVPB 3.375 g  Status:  Discontinued        3.375 g 12.5 mL/hr over 240 Minutes Intravenous Every 6 hours 01/18/24 1241 01/18/24 1242   01/18/24 1400  piperacillin -tazobactam (ZOSYN ) IVPB 3.375 g        3.375 g 100 mL/hr over 30 Minutes Intravenous Every 6 hours 01/18/24 1242 01/23/24 2046   01/18/24 0000  piperacillin -tazobactam (ZOSYN ) IVPB 2.25 g  Status:  Discontinued        2.25 g 100 mL/hr over 30 Minutes Intravenous Every 6 hours 01/17/24 2120 01/18/24 1241   01/12/24 0200  piperacillin -tazobactam (ZOSYN ) IVPB 3.375 g  Status:  Discontinued        3.375 g 12.5 mL/hr  over 240 Minutes Intravenous Every 8 hours 01/11/24 2120 01/17/24 2120   01/11/24 1815  piperacillin -tazobactam (ZOSYN ) IVPB 3.375 g        3.375 g 100 mL/hr over 30 Minutes Intravenous  Once 01/11/24 1806 01/11/24 1921        Assessment/Plan LAPAROTOMY, EXPLORATORY RIGHT COLECTOMY (without anastomosis) PLACEMENT OF ABTHERA WOUND VAC DRAINAGE OF INTRA-ABDOMINAL ABSCESS  4/16 EW   RE-EXPLORATION ABDOMEN, PLACEMENT OF ABTHERA WOUND VAC, SMALL BOWEL RESECTION 01/16/2024 EW   Re-exploration abdomen, small bowel resection, creation of end ileostomy, abd closure, placement wound vac 4/19 EW  -NGT output about 500 in last 24 hrs.  Ileostomy working well.  Will clamp NGT today and give CLD.  Return to LIWS if develops N/V -cont to monitor ostomy output -WBC 24K.  AF.  Continue to monitor.  Benign abdominal exam today.  CT from 4/25 reviewed.  Fluid collection adjacent to surgical drain, appears to almost traverse it.  Continue to monitor this and it's output.  Could ask IR to exchange surgical for larger IR drain to better drain if needed moving forward.  Right now contiue to monitor -currently not on abx, completely 5 days   FEN - NGT clamped/CLD VTE - heparin  gtt ID - completed post op abx, WBC 24K.  monitor  Septic shock - resolved ABL  anemia - monitor and transfuse as needed(14 u prbc total, 1u cryo, 6u FFP, 1 plt) Coagulopathy - resolved AKI - started CRRT 4/19,  defer to CCM/renal, plan for iHD Acute hypoxemic resp failure - extubated H/o VTE/PE/DVT - scds for now; was on coumadin    LOS: 15 days    Leone Ralphs , [facility] Surgery 01/26/2024, 7:44 AM Please see Amion for pager number during day hours 7:00am-4:30pm or 7:00am -11:30am on weekends

## 2024-01-26 NOTE — Progress Notes (Signed)
 PROGRESS NOTE    Alexander [name]  ZOX:[id number] DOB: Jan 02, 1961 DOA: 01/11/2024 PCP: Alexander Iba, PA  Subjective: Patient seen and examined. Remains on TPN.  Had about 550 of NG output, but he was allowed ice chips yesterday. Ice chips were not recorded.  He made about 850 in urine yesterday. Nephrology planning on HD today.    Hospital Course: HPI: Alexander [name] is a 63 y.o. male with medical history significant of Pe/DVT on coumadin , diastolic CHF. Presented with  bRBPR for 1 day. Weakness and syncope with blood per rectum and abd pain. In ER BRBPR large BM.  Was tachycardic and elevated WBC CT showed diverticulits.  HGB 8. Was not aware of fever or chills. 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    Reports abd pain has been ongoing for the past few days. Stools more loose today  Significant Events: Admitted 01/11/2024 for GI bleeding 4/16 to OR for ex lap, returned to ICU on vent 4/16 cardiorespiratory arrest, achieved ROSC.  Coagulopathy 4/17 blood products + multiple pressors  4/18 back to the OR 4/19 encephalopathic. Back to the OR this morning-s/p exploratory laparotomy, ileostomy creation, small bowel resection, wound VAC in place 01-18-2024 nephrology consulted for CRRT 4/21 weaning pressors  4/23 extubated; off pressors 01-24-2024 Transferred from [facility] ICU to [facility] progressive due to need for hemodialysis.  Antibiotic Therapy: Anti-infectives (From admission, onward)    Start     Dose/Rate Route Frequency Ordered Stop   01/18/24 1400  piperacillin -tazobactam (ZOSYN ) IVPB 3.375 g  Status:  Discontinued        3.375 g 12.5 mL/hr over 240 Minutes Intravenous Every 6 hours 01/18/24 1241 01/18/24 1242   01/18/24 1400  piperacillin -tazobactam (ZOSYN ) IVPB 3.375 g        3.375 g 100 mL/hr over 30 Minutes Intravenous Every 6 hours 01/18/24 1242 01/23/24 2046   01/18/24 0000  piperacillin -tazobactam (ZOSYN ) IVPB 2.25 g  Status:  Discontinued         2.25 g 100 mL/hr over 30 Minutes Intravenous Every 6 hours 01/17/24 2120 01/18/24 1241   01/12/24 0200  piperacillin -tazobactam (ZOSYN ) IVPB 3.375 g  Status:  Discontinued        3.375 g 12.5 mL/hr over 240 Minutes Intravenous Every 8 hours 01/11/24 2120 01/17/24 2120   01/11/24 1815  piperacillin -tazobactam (ZOSYN ) IVPB 3.375 g        3.375 g 100 mL/hr over 30 Minutes Intravenous  Once 01/11/24 1806 01/11/24 1921       Procedures: 01-15-2024 right hemicolectomy 01-16-2024 small bowel resection 01-18-2024 ileostomy, small bowel resection 01-18-2024 femoral HD catheter insertion  Consultants: PCCM GI General Surgery Nephrology    Assessment and Plan: * Spontaneous perforation of colon (HCC) 01-25-2024 s/p multiple laparotomies.  S/p right hemicolectomy, small bowel resection and ileostomy creation.  Pt remains with high output NG tube and ileostomy drainage. Remains on IV TPN. Patient transferred over to [facility] from [facility], ICU last night due to continued need for hemodialysis.  He had been on CRRT in the [facility] ICU.  Nephrology is determined he will need intermittent hemodialysis now.  This is not offered at [facility]. Patient initially admitted on 01/11/2024 due to rectal bleeding.  He was initially placed on IV antibiotics.  GI and nephrology were consulted.  He had tremendous blood loss.  He was taken the operating room on 01/15/2024.  He was found to have a colonic perforation.  He underwent right hemicolectomy.  He suffered an acute cardiac arrest postoperatively about 8 hours after his surgery.  He had ROSC after about 4 minutes of CPR. He was taken back to surgery in the day on  01/16/2024 due to continued massive blood loss.  He had massive transfusion protocol initiated. He was taken the operating the third time on 01/18/2024 for ileostomy creation. He suffered worsening renal failure and became oliguric.  Nephrology was consulted on 01/18/2024 and he started CRRT  while in the ICU at [facility]. He was extubated on 01/22/2024.  His renal recovery has been nonexistent to slow. Nephrology is determined that he will need intermittent hemodialysis.  He was transferred from [facility], ICU to a progressive bed at [facility] on 01/24/2024 so that hemodialysis could be performed. Patient remains with high output NG tube and ileostomy output.  He remains on TPN.  He has been off antibiotics for several days now.  01-26-2024 surgery team aware of his leukocytosis of 24K.  Fluid collection noted on CT abd. Defer to surgery to deal with fluid collection. Remains off IV ABX per surgery.  Appears surgery team going to clamp his NG tube today and see how he does.  Hopefully he can get his NG tube out and start diet soon.  Continue with TPN for now.  AKI (acute kidney injury) (HCC) 01-25-2024 likely caused by a combination of acute blood loss anemia, hypotension due to cardiac arrest. Needed CRRT from 01-18-2024 through 01-24-2024. Pt transferred from [facility] ICU over to [facility] progressive bed in order to receive continued HD. Nephrology following. Hopeful for renal recovery.  01-26-2024 pt for HD today. Defer to nephrology when to get his right femoral HD catheter out and have permcath placed.  Acute blood loss anemia 01-25-2024 no longer bleeding. Due to elevated INR from coumadin , perforated colon. S/p 13 units PRBC since admission.  01-26-2024 HgB stable at 7.9  Hematochezia 01-25-2024 due to perforated colon and elevated INR. Now resolved.  Iron  deficiency anemia 01-11-2024 through 01-25-2024  Folate normal, B12 severely low (93) -Ferritin low and iron  labs also low.  Venofer  repletion ordered x 3 days - Also repleting B12  01-26-2024 stable.  Diverticulitis 01-25-2024 pt has completed IV abx.  Essential hypertension, benign 01-25-2024 off IV vasopressors. Prn IV labetalol   01-26-2024 stable.  History of pulmonary embolus (PE) 01-25-2024 remains on IV heparin   gtts.  01-26-2024 remains on IV heparin  gtts.  01-25-2024 was on coumadin  at home. Currently on IV heparin .  Coagulopathy (HCC)-resolved as of 01/26/2024 01-25-2024 due to coumadin , colonic perforation. Now resolved.  Acute hypoxic respiratory failure (HCC)-resolved as of 01/26/2024 01-25-2024 was extubated on 01-22-2024. Currently on RA.  Rectal bleeding-resolved as of 01/26/2024 01-25-2024 due to perforated colon and elevated INR. Now resolved.   ABLA (acute blood loss anemia)-resolved as of 01/26/2024 - Due to ongoing rectal bleeding from presumed diverticulitis at this time - Has now required 5 units PRBCs since admission -Hgb 7.7 g/dL this morning -Will continue trending and transfuse as necessary  Severe sepsis (HCC)-resolved as of 01/14/2024 - Fever, tachycardia, tachypnea, leukocytosis.  Presumed abdominal infection with suspected diverticulitis - Continue Zosyn  - stable now over past 24 hrs after tx to ICU  DVT prophylaxis: SCD's Start: 01/15/24 1400  IV Heparin    Code Status: Full Code Family Communication: no family at bedside. Spoke with pt's family including his son Darius yesterday at bedside Disposition Plan: unknown Reason for continuing need for hospitalization: remains on TPN, NG tube, HD.  Objective: Vitals:  01/25/24 2300 01/26/24 0309 01/26/24 0620 01/26/24 0736  BP: (!) 157/79 (!) 160/80  (!) 159/78  Pulse: 89 83    Resp: 17 15    Temp: 98.8 F (37.1 C) 98 F (36.7 C)  97.9 F (36.6 C)  TempSrc: Oral Oral  Oral  SpO2: 91% 92%    Weight:   78.7 kg   Height:        Intake/Output Summary (Last 24 hours) at 01/26/2024 1103 Last data filed at 01/26/2024 0618 Gross per 24 hour  Intake 2394.39 ml  Output 1635 ml  Net 759.39 ml   Filed Weights   01/24/24 2320 01/25/24 0350 01/26/24 0620  Weight: 78.1 kg 78.6 kg 78.7 kg    Examination:  Physical Exam Vitals and nursing note reviewed.  Constitutional:      General: He is not in acute distress.     Appearance: He is not toxic-appearing or diaphoretic.  HENT:     Nose:     Comments: +NG tube Cardiovascular:     Rate and Rhythm: Normal rate and regular rhythm.  Pulmonary:     Effort: Pulmonary effort is normal. No respiratory distress.     Breath sounds: Normal breath sounds.  Abdominal:     General: There is no distension.     Comments: +ileostomy  Musculoskeletal:     Right lower leg: No edema.     Left lower leg: No edema.  Skin:    General: Skin is warm and dry.     Capillary Refill: Capillary refill takes less than 2 seconds.  Neurological:     Comments: Awake. Slightly confused. Stating he needs a cane to walk.     Data Reviewed: I have personally reviewed following labs and imaging studies  CBC: Recent Labs  Lab 01/20/24 0427 01/21/24 0504 01/22/24 1033 01/23/24 0505 01/24/24 0552 01/26/24 0610  WBC 14.9*  --  15.8* 18.5* 23.9* 24.6*  HGB 9.8*  --  9.3* 9.0* 8.5* 7.9*  HCT 29.3*  --  30.5* 28.5* 27.5* 24.8*  MCV 89.1  --  95.9 94.4 95.5 92.5  PLT 103*  103* 93* 139* 227 405* 641*   Basic Metabolic Panel: Recent Labs  Lab 01/22/24 0417 01/22/24 1557 01/23/24 0503 01/23/24 0505 01/24/24 0552 01/25/24 0540 01/26/24 0610  NA 138   < > 135 134* 138 140 142  K 4.1   < > 3.6 3.6 3.5 3.6 3.4*  CL 103   < > 104 102 103 106 106  CO2 26   < > 24 24 25 22  20*  GLUCOSE 137*   < > 155* 154* 144* 153* 152*  BUN 36*   < > 32* 32* 57* 81* 97*  CREATININE 2.13*   < > 1.90* 1.94* 3.89* 5.21* 6.11*  CALCIUM  7.3*   < > 7.2* 7.1* 7.4* 7.6* 7.7*  MG 2.5*  --   --  2.5* 2.3 2.2 2.1  PHOS 2.2*   < > 2.6 2.7 4.5 4.7* 4.5   < > = values in this interval not displayed.   GFR: Estimated Creatinine Clearance: 12.7 mL/min (A) (by C-G formula based on SCr of 6.11 mg/dL (H)). Liver Function Tests: Recent Labs  Lab 01/20/24 1610 01/20/24 1524 01/21/24 0504 01/21/24 1729 01/22/24 0417 01/22/24 1557 01/23/24 0503 01/23/24 0505 01/24/24 0552 01/25/24 0540  01/26/24 0610  AST 340*  --  146*  --  85*  --   --  57* 41  --   --  ALT 666*  --  415*  --  265*  --   --  179* 118*  --   --   ALKPHOS 95  --  83  --  78  --   --  69 63  --   --   BILITOT 2.6*  --  1.9*  --  2.2*  --   --  2.2* 1.8*  --   --   PROT 5.0*  --  5.1*  --  5.1*  --   --  5.4* 5.4*  --   --   ALBUMIN  1.8*  1.9*   < > 1.7*  1.7*   < > 1.7*  1.7*   < > 1.7* 1.7* 1.5*  1.6* <1.5* <1.5*   < > = values in this interval not displayed.   Coagulation Profile: Recent Labs  Lab 01/20/24 0427 01/21/24 0504 01/24/24 0552 01/26/24 0610  INR 1.0 1.0 1.1 1.2   BNP (last 3 results) Recent Labs    07/10/23 1749  BNP 334.8*   CBG: Recent Labs  Lab 01/25/24 0621 01/25/24 1123 01/25/24 1625 01/26/24 0055 01/26/24 0606  GLUCAP 157* 124* 130* 111* 125*   Radiology Studies: CT ABDOMEN PELVIS WO CONTRAST Result Date: 01/24/2024 CLINICAL DATA:  Abdominal abscess EXAM: CT ABDOMEN AND PELVIS WITHOUT CONTRAST TECHNIQUE: Multidetector CT imaging of the abdomen and pelvis was performed following the standard protocol without IV contrast. RADIATION DOSE REDUCTION: This exam was performed according to the departmental dose-optimization program which includes automated exposure control, adjustment of the mA and/or kV according to patient size and/or use of iterative reconstruction technique. COMPARISON:  01/14/2019 FINDINGS: Lower chest: Small pleural effusions, increased on the right since previous. Dependent consolidation/atelectasis throughout the right lower lobe and in the posterior left lower lobe. Blood pool is hypodense compared to the interventricular septum suggesting anemia. Hepatobiliary: New heterogenous low-attenuation poorly marginated parenchymal regions involving primarily the posterior right hepatic lobe, and hepatic segment 4. The gallbladder is nondistended. No biliary ductal dilatation. Pancreas: Unremarkable. No pancreatic ductal dilatation or surrounding inflammatory  changes. Spleen: Normal in size without focal abnormality. Adrenals/Urinary Tract: Normal adrenal glands. Symmetric renal contours without urolithiasis or hydronephrosis. Urinary bladder decompressed by Foley catheter. Stomach/Bowel: Gastric tube passes through decompressed stomach into the third portion of the decompressed duodenum. Contrast material in multiple mildly distended small bowel loops in the left and mid abdomen. Right lower quadrant ileostomy. Partial right colectomy. Distal colon decompressed, with scattered descending diverticula. Vascular/Lymphatic: Right femoral venous catheter to the right common iliac vein. Scattered aortoiliac calcified plaque without AAA. No abdominal or pelvic adenopathy localized. Reproductive: Prostate is unremarkable. Other: Left lower quadrant surgical drain loops under the liver. Poorly marginated 5.9 cm fluid collection adjacent to the drain inferior to the gallbladder. Edema throughout the mesentery. No definite free air. Musculoskeletal: Lumbar spondylitic change L4-S1. Vertebral endplate spurring at multiple levels in the lower thoracic spine. 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 IMPRESSION: 1. 5.9 cm fluid collection inferior to the gallbladder, adjacent to the surgical drain. 2. New heterogenous low-attenuation poorly marginated parenchymal regions involving primarily the posterior right hepatic lobe, and hepatic segment 4, possibly developing hepatic abscesses but incompletely characterized. Contrast-enhanced CT or MRI may be useful for further characterization. 3. Small pleural effusions, increased on the right since previous. Dependent consolidation/atelectasis throughout the right lower lobe and in the posterior left lower lobe. Electronically Signed   By: Nicoletta Barrier M.D.   On: 01/24/2024 16:36  Scheduled Meds:  Chlorhexidine  Gluconate Cloth  6 each Topical Daily   insulin  aspart  0-15 Units Subcutaneous Q6H   mouth rinse  15 mL  Mouth Rinse 4 times per day   pantoprazole  (PROTONIX ) IV  40 mg Intravenous Q12H   Continuous Infusions:  sodium chloride  10 mL/hr at 01/24/24 0700   heparin  1,950 Units/hr (01/26/24 0057)   potassium chloride  10 mEq (01/26/24 1050)   TPN ADULT (ION) 90 mL/hr at 01/25/24 1738   TPN ADULT (ION)       LOS: 15 days   Time spent: 45 minutes  Unk Garb, DO  [facility]  01/26/2024, 11:03 AM

## 2024-01-26 NOTE — Plan of Care (Signed)
  Problem: Education: Goal: Knowledge of General Education information will improve Description: Including pain rating scale, medication(s)/side effects and non-pharmacologic comfort measures Outcome: Progressing   Problem: Clinical Measurements: Goal: Will remain free from infection Outcome: Progressing   Problem: Pain Managment: Goal: General experience of comfort will improve and/or be controlled Outcome: Progressing   Problem: Coping: Goal: Ability to adjust to condition or change in health will improve Outcome: Progressing   Problem: Skin Integrity: Goal: Risk for impaired skin integrity will decrease Outcome: Progressing

## 2024-01-27 DIAGNOSIS — Z86711 Personal history of pulmonary embolism: Secondary | ICD-10-CM | POA: Diagnosis not present

## 2024-01-27 DIAGNOSIS — N179 Acute kidney failure, unspecified: Secondary | ICD-10-CM | POA: Diagnosis not present

## 2024-01-27 DIAGNOSIS — K631 Perforation of intestine (nontraumatic): Secondary | ICD-10-CM | POA: Diagnosis not present

## 2024-01-27 DIAGNOSIS — I10 Essential (primary) hypertension: Secondary | ICD-10-CM | POA: Diagnosis not present

## 2024-01-27 LAB — GLUCOSE, CAPILLARY
Glucose-Capillary: 109 mg/dL — ABNORMAL HIGH (ref 70–99)
Glucose-Capillary: 131 mg/dL — ABNORMAL HIGH (ref 70–99)
Glucose-Capillary: 141 mg/dL — ABNORMAL HIGH (ref 70–99)
Glucose-Capillary: 89 mg/dL (ref 70–99)
Glucose-Capillary: 92 mg/dL (ref 70–99)

## 2024-01-27 LAB — CBC
HCT: 25.4 % — ABNORMAL LOW (ref 39.0–52.0)
Hemoglobin: 8.2 g/dL — ABNORMAL LOW (ref 13.0–17.0)
MCH: 29.5 pg (ref 26.0–34.0)
MCHC: 32.3 g/dL (ref 30.0–36.0)
MCV: 91.4 fL (ref 80.0–100.0)
Platelets: 667 10*3/uL — ABNORMAL HIGH (ref 150–400)
RBC: 2.78 MIL/uL — ABNORMAL LOW (ref 4.22–5.81)
RDW: 19.5 % — ABNORMAL HIGH (ref 11.5–15.5)
WBC: 26.1 10*3/uL — ABNORMAL HIGH (ref 4.0–10.5)
nRBC: 0 % (ref 0.0–0.2)

## 2024-01-27 LAB — RENAL FUNCTION PANEL
Albumin: <1.5 g/dL — ABNORMAL LOW (ref 3.5–5.0)
Anion gap: 11 (ref 5–15)
BUN: 55 mg/dL — ABNORMAL HIGH (ref 8–23)
CO2: 25 mmol/L (ref 22–32)
Calcium: 7.8 mg/dL — ABNORMAL LOW (ref 8.9–10.3)
Chloride: 103 mmol/L (ref 98–111)
Creatinine, Ser: 3.91 mg/dL — ABNORMAL HIGH (ref 0.61–1.24)
GFR, Estimated: 17 mL/min — ABNORMAL LOW (ref >60)
Glucose, Bld: 108 mg/dL — ABNORMAL HIGH (ref 70–99)
Phosphorus: 2.3 mg/dL — ABNORMAL LOW (ref 2.5–4.6)
Potassium: 3.3 mmol/L — ABNORMAL LOW (ref 3.5–5.1)
Sodium: 139 mmol/L (ref 135–145)

## 2024-01-27 LAB — HEPARIN LEVEL (UNFRACTIONATED): Heparin Unfractionated: 0.38 [IU]/mL (ref 0.30–0.70)

## 2024-01-27 LAB — PROTIME-INR
INR: 1.2 (ref 0.8–1.2)
Prothrombin Time: 15.2 s (ref 11.4–15.2)

## 2024-01-27 LAB — TRIGLYCERIDES: Triglycerides: 63 mg/dL (ref <150)

## 2024-01-27 LAB — MAGNESIUM: Magnesium: 1.7 mg/dL (ref 1.7–2.4)

## 2024-01-27 MED ORDER — ENSURE ENLIVE PO LIQD
237.0000 mL | Freq: Two times a day (BID) | ORAL | Status: DC
  Administered 2024-01-27: 237 mL via ORAL

## 2024-01-27 MED ORDER — POTASSIUM CHLORIDE 10 MEQ/50ML IV SOLN
10.0000 meq | INTRAVENOUS | Status: AC
  Administered 2024-01-27 (×4): 10 meq via INTRAVENOUS
  Filled 2024-01-27 (×2): qty 50

## 2024-01-27 MED ORDER — METHOCARBAMOL 1000 MG/10ML IJ SOLN
500.0000 mg | Freq: Four times a day (QID) | INTRAMUSCULAR | Status: DC | PRN
  Administered 2024-02-02 – 2024-02-09 (×4): 500 mg via INTRAVENOUS
  Filled 2024-01-27 (×5): qty 10

## 2024-01-27 MED ORDER — DEXTROSE IN LACTATED RINGERS 5 % IV SOLN: INTRAVENOUS | Status: DC

## 2024-01-27 MED ORDER — PROPOFOL 1000 MG/100ML IV EMUL
INTRAVENOUS | Status: AC
  Filled 2024-01-27: qty 600

## 2024-01-27 MED ORDER — PANTOPRAZOLE SODIUM 40 MG PO TBEC
40.0000 mg | DELAYED_RELEASE_TABLET | Freq: Every day | ORAL | Status: DC
Start: 2024-01-27 — End: 2024-01-28
  Administered 2024-01-28: 40 mg via ORAL
  Filled 2024-01-27 (×2): qty 1

## 2024-01-27 MED ORDER — ACETAMINOPHEN 500 MG PO TABS
1000.0000 mg | ORAL_TABLET | Freq: Four times a day (QID) | ORAL | Status: DC | PRN
Start: 2024-01-27 — End: 2024-01-28
  Administered 2024-01-27: 1000 mg via ORAL
  Filled 2024-01-27: qty 2

## 2024-01-27 MED ORDER — METHOCARBAMOL 1000 MG/10ML IJ SOLN
500.0000 mg | Freq: Once | INTRAMUSCULAR | Status: AC
  Administered 2024-01-27: 500 mg via INTRAVENOUS
  Filled 2024-01-27: qty 10

## 2024-01-27 MED ORDER — DEXTROSE-SODIUM CHLORIDE 5-0.45 % IV SOLN: INTRAVENOUS | Status: DC

## 2024-01-27 MED ORDER — MAGNESIUM SULFATE 2 GM/50ML IV SOLN
2.0000 g | Freq: Once | INTRAVENOUS | Status: AC
  Administered 2024-01-27: 2 g via INTRAVENOUS
  Filled 2024-01-27: qty 50

## 2024-01-27 MED ORDER — TRACE MINERALS CU-MN-SE-ZN 300-55-60-3000 MCG/ML IV SOLN
INTRAVENOUS | Status: DC
  Filled 2024-01-27: qty 761.6

## 2024-01-27 MED ORDER — METHOCARBAMOL 1000 MG/10ML IJ SOLN: 500.0000 mg | Freq: Once | INTRAMUSCULAR | Status: DC

## 2024-01-27 MED ORDER — K PHOS MONO-SOD PHOS DI & MONO 155-852-130 MG PO TABS
500.0000 mg | ORAL_TABLET | Freq: Once | ORAL | Status: AC
  Administered 2024-01-27: 500 mg via ORAL
  Filled 2024-01-27: qty 2

## 2024-01-27 NOTE — Consult Note (Signed)
 WOC Nurse ostomy follow up Stoma type/location: Ileostomy RLQ. Stomal assessment/size: Red and viable above the skin level. 30 mm round. Peristomal assessment: intact. Treatment options for stomal/peristomal skin: Ring 2" Output 180 ml brown/black liquid at 0930. Ostomy pouching: 1pc. Convex [id number] and ring [phone] Education provided: Provide instructions to the patient, no family member or caregiver present. He follow the ostomy care with no hands on.  Enrolled patient in [place] Secure Start Discharge program: Yes.  WOC Nurse wound follow up Wound type: Abdominal wound. Dehiscence. Measurement: 15 cm x 4 cm x 4 cm Wound bed: 80% red, 20% yellow/brown. Drainage (amount, consistency, odor) Moderated amount.  Periwound: intact Dressing procedure/placement/frequency: Cleanse with Vashe [id number], do not rinse. Wet to dry dressing, changing twice a day.  WOC team will follow for ostomy teaching weekly.   Please reconsult if further assistance is needed. Thank-you,  Rachel Budds BSN, RN, [employer], WOC  (Pager: [phone])

## 2024-01-27 NOTE — Plan of Care (Signed)
  Problem: Education: Goal: Knowledge of General Education information will improve Description: Including pain rating scale, medication(s)/side effects and non-pharmacologic comfort measures Outcome: Not Progressing   Problem: Health Behavior/Discharge Planning: Goal: Ability to manage health-related needs will improve Outcome: Not Progressing   Problem: Clinical Measurements: Goal: Ability to maintain clinical measurements within normal limits will improve Outcome: Progressing Goal: Will remain free from infection Outcome: Progressing Goal: Diagnostic test results will improve Outcome: Progressing Goal: Respiratory complications will improve Outcome: Progressing Goal: Cardiovascular complication will be avoided Outcome: Progressing   Problem: Nutrition: Goal: Adequate nutrition will be maintained Outcome: Not Progressing   Problem: Coping: Goal: Level of anxiety will decrease Outcome: Not Progressing   Problem: Elimination: Goal: Will not experience complications related to bowel motility Outcome: Progressing Goal: Will not experience complications related to urinary retention Outcome: Progressing   Problem: Pain Managment: Goal: General experience of comfort will improve and/or be controlled Outcome: Not Progressing   Problem: Safety: Goal: Ability to remain free from injury will improve Outcome: Progressing   Problem: Skin Integrity: Goal: Risk for impaired skin integrity will decrease Outcome: Not Progressing   Problem: Activity: Goal: Ability to tolerate increased activity will improve Outcome: Not Progressing   Problem: Respiratory: Goal: Ability to maintain a clear airway and adequate ventilation will improve Outcome: Progressing   Problem: Role Relationship: Goal: Method of communication will improve Outcome: Not Progressing   Problem: Health Behavior/Discharge Planning: Goal: Ability to identify and utilize available resources and services will  improve Outcome: Not Progressing Goal: Ability to manage health-related needs will improve Outcome: Not Progressing   Problem: Nutritional: Goal: Maintenance of adequate nutrition will improve Outcome: Not Progressing Goal: Progress toward achieving an optimal weight will improve Outcome: Not Progressing   Problem: Skin Integrity: Goal: Risk for impaired skin integrity will decrease Outcome: Not Progressing   Problem: Tissue Perfusion: Goal: Adequacy of tissue perfusion will improve Outcome: Progressing

## 2024-01-27 NOTE — Progress Notes (Signed)
 9 Days Post-Op  Subjective: No abdominal pain. Tolerating cld with ngt clamped. Having ostomy output.   Tmax 99.9. Tachycardia resolved. No hypotension. WBC up at 26.1 (24.6). Hgb stable at 8.2 On o2. Some sob. No cp or cough.  RIJ and R fem lines.  S/p HD yesterday. Good uop. CR 3.91 (6.11). K 3.3.  Objective: Vital signs in last 24 hours: Temp:  [97.8 F (36.6 C)-99.9 F (37.7 C)] 99.9 F (37.7 C) (04/28 0332) Pulse Rate:  [91-107] 92 (04/28 0002) Resp:  [17-23] 22 (04/28 0002) BP: (136-178)/(74-86) 138/74 (04/28 0002) SpO2:  [92 %-100 %] 94 % (04/28 0002) Weight:  [81 kg] 81 kg (04/28 0644) Last BM Date : 01/26/24  Intake/Output from previous day: 04/27 0701 - 04/28 0700 In: 1033.1 [P.O.:120; I.V.:731.1; IV Piggyback:181.9] Out: 141765 [Urine:1030; Drains:10; Stool:625] Intake/Output this shift: No intake/output data recorded.  PE: Gen: Awake and alert, NAD Abd: Soft, NGT clamped. Mild distension. NT. Ileostomy viable with thin dark output in bag. Midline wound as noted below - mostly granulation tissues, some areas w/ fibrinous tissue at the base, no drainage. JP in place, 10cc of output - ss  Lab Results:  Recent Labs    01/26/24 0610 01/27/24 0045  WBC 24.6* 26.1*  HGB 7.9* 8.2*  HCT 24.8* 25.4*  PLT 641* 667*   BMET Recent Labs    01/26/24 0610 01/27/24 0045  NA 142 139  K 3.4* 3.3*  CL 106 103  CO2 20* 25  GLUCOSE 152* 108*  BUN 97* 55*  CREATININE 6.11* 3.91*  CALCIUM  7.7* 7.8*   PT/INR Recent Labs    01/26/24 0610 01/27/24 0045  LABPROT 15.6* 15.2  INR 1.2 1.2   CMP     Component Value Date/Time   NA 139 01/27/2024 0045   NA 141 01/22/2020 1137   K 3.3 (L) 01/27/2024 0045   CL 103 01/27/2024 0045   CO2 25 01/27/2024 0045   GLUCOSE 108 (H) 01/27/2024 0045   BUN 55 (H) 01/27/2024 0045   BUN 13 01/22/2020 1137   CREATININE 3.91 (H) 01/27/2024 0045   CREATININE 0.98 06/14/2017 0935   CALCIUM  7.8 (L) 01/27/2024 0045   PROT 5.4  (L) 01/24/2024 0552   PROT 7.3 01/22/2020 1137   ALBUMIN  <1.5 (L) 01/27/2024 0045   ALBUMIN  4.2 01/22/2020 1137   AST 41 01/24/2024 0552   ALT 118 (H) 01/24/2024 0552   ALKPHOS 63 01/24/2024 0552   BILITOT 1.8 (H) 01/24/2024 0552   BILITOT 0.4 01/22/2020 1137   GFRNONAA 17 (L) 01/27/2024 0045   GFRNONAA >89 09/17/2014 1253   GFRAA 111 01/22/2020 1137   GFRAA >89 09/17/2014 1253   Lipase     Component Value Date/Time   LIPASE 22 09/28/2019 1109       Studies/Results: No results found.   Anti-infectives: Anti-infectives (From admission, onward)    Start     Dose/Rate Route Frequency Ordered Stop   01/18/24 1400  piperacillin -tazobactam (ZOSYN ) IVPB 3.375 g  Status:  Discontinued        3.375 g 12.5 mL/hr over 240 Minutes Intravenous Every 6 hours 01/18/24 1241 01/18/24 1242   01/18/24 1400  piperacillin -tazobactam (ZOSYN ) IVPB 3.375 g        3.375 g 100 mL/hr over 30 Minutes Intravenous Every 6 hours 01/18/24 1242 01/23/24 2046   01/18/24 0000  piperacillin -tazobactam (ZOSYN ) IVPB 2.25 g  Status:  Discontinued        2.25 g 100 mL/hr over 30 Minutes  Intravenous Every 6 hours 01/17/24 2120 01/18/24 1241   01/12/24 0200  piperacillin -tazobactam (ZOSYN ) IVPB 3.375 g  Status:  Discontinued        3.375 g 12.5 mL/hr over 240 Minutes Intravenous Every 8 hours 01/11/24 2120 01/17/24 2120   01/11/24 1815  piperacillin -tazobactam (ZOSYN ) IVPB 3.375 g        3.375 g 100 mL/hr over 30 Minutes Intravenous  Once 01/11/24 1806 01/11/24 1921        Assessment/Plan LAPAROTOMY, EXPLORATORY RIGHT COLECTOMY (without anastomosis) PLACEMENT OF ABTHERA WOUND VAC DRAINAGE OF INTRA-ABDOMINAL ABSCESS  4/16 EW   RE-EXPLORATION ABDOMEN, PLACEMENT OF ABTHERA WOUND VAC, SMALL BOWEL RESECTION 01/16/2024 EW   Re-exploration abdomen, small bowel resection, creation of end ileostomy, abd closure, placement wound vac 4/19 EW  - D/c NGT. FLD. Shakes. Consider weaning TPN tomorrow - Cont to  monitor ostomy output - Completed post op abx - WBC uptrending at 26.1. Benign abdominal exam today and advancing diet.  CT from 4/25 reviewed.  Fluid collection adjacent to surgical drain, appears to almost traverse it.  Low output from drain. IR reviewed scan on 4/26 and no procedures planned and recommended monitoring of clinical status with re-imaging if not improved. Will review with MD about abx and if continue to monitor vs start drain flushes vs ask IR to exchange surgical for larger IR drain to better drain if needed moving forward   FEN - D/c NGT. FLD. Shakes. TPN. Hypokalemia per nephro VTE - SCDs, heparin  gtt  ID - Completed post op abx. WBC 26.1. As above. Otherwise defer other etiologies of leukocytosis w/u to primary   - Per TRH -  Pleural effusions Septic shock - resolved ABL anemia - monitor and transfuse as needed (14 u prbc total, 1u cryo, 6u FFP, 1 plt) Coagulopathy - resolved AKI - started CRRT 4/19,  defer to CCM/renal, on iHD Acute hypoxemic resp failure - extubated H/o VTE/PE/DVT - scds for now; was on coumadin    LOS: 16 days    Delton Filbert , [facility] 01/27/2024, 8:40 AM Please see Amion for pager number during day hours 7:00am-4:30pm or 7:00am -11:30am on weekends

## 2024-01-27 NOTE — Progress Notes (Signed)
   Discussed with general surgery and nephrology. Holding off HD today. His urine output is increasing.  Apparently there is a nursing policy that TPN cannot be infused if terminal tip of CVL is not within the SVC. Tip of his CVL is in the right brachiocephalic vein.  Discussed with general surgery. Pt's diet has been advanced to full liquid so no further need for TPN.  Will attempt to get PIV access so that CVL can be removed.  Pt still has right femoral CVL.  Leaving him on IV heparin  in case he will need permcath placement. Also potential for pt needing additional abd drain vs drain repositioning.  Unk Garb, DO [facility]

## 2024-01-27 NOTE — Progress Notes (Signed)
 PHARMACY - TOTAL PARENTERAL NUTRITION CONSULT NOTE   Indication:  intolerance to enteral nutrition  Patient Measurements: Height: 5\' 9"  (175.3 cm) Weight: 81 kg (178 lb 9.2 oz) IBW/kg (Calculated) : 70.7 TPN AdjBW (KG): 78.1 Body mass index is 26.37 kg/m.  Assessment: 62yoM admitted 4/12 for rectal bleeding and weakness s/p multiple GI surgeries with findings of colonic perforation requiring resection of R colon, closure on 4/19. Pt with high OG output post-operatively with no meal intake since admission. Prior to admission, patient reports intake of "normal food". Pharmacy to start TPN for prolonged duration without enteral nutrition and prolonged ileus.  Glucose / Insulin : no hx DM, CBG <180 on mSSI, 6 units used in 24h Electrolytes: K 3.3 (s/p 4 runs and increase in TPN from 0 to 22 mEq), Mg 1.7, Phos 2.3, others WNL Required aggressive Ca replacement earlier in admission Renal: CRRT 4/19 >> 4/24. Monitor for renal recovery, received HD 4/27 PM per RN. Nephrology following. Hepatic: LFTs trending down (shock liver), albumin  <1.5, Tbili elevated but trending down, TG 63 on 4/28 Intake / Output; MIVF: UOP 0.5 mL/kg/h (1050 mL), stool output 625 mL, drain output down 10 mL 4/27 no NGT output documented, successful clamping trial with plan to remove NGT 4/28 4/28 per RN, had 1L removed in HD  GI Imaging: 4/15 CT A/P: wall thickening of R colon with inflammation and suspected enlarging contained perforation 2/2 diverticulitis, colitis or neoplasm. No organized fluid collection or pneumoperitoneum. Interval increased gastric distension consistent with an ileus. 4/25 CT A/P: 5.9 cm fluid collection inferior to the gallbladder, possibly hepatic abscess  GI Surgeries / Procedures:  4/19 Re-exploration abdomen, small bowel resection, creation of end ileostomy, abd closure and placement of wound vac 4/17 Re-exploration abdomen, small bowel resection 4/16 Right colectomy, drainage of  abscess 4/19 ex lap, end ileostomy excision, wound vac  Central access: CVC TPN start date: 4/21  Nutritional Goals: Goal rate of 90 mL/hr will provide 108 g protein, 2181 kcal  RD Assessment: (recalculated 4/24 after CRRT stopped)  Estimated Needs Total Energy Estimated Needs: 2000-2250 kcals Total Protein Estimated Needs: 100-115 grams Total Fluid Estimated Needs: >/= 2L  Current Nutrition:  4/27 CLD and TPN Per patient 4/28, has tolerated jell-o and broth without abdominal pain or N/V sx.   Plan: Decrease concentrated TPN to goal 70 mL/hr at 1800 to remove free water  Electrolytes in TPN: Na 150mEq/L, increase K to 20 mEq/L, increase Ca 3 mEq/L, increase Mg 2 mEq/L, and increase Phos 3 mmol/L. Cl:Ac 1:1 Supplement Mg 2g IV x1, Phos Neutral 500 mg PO x1, Kcl 10 mEq IV x4 outside of TPN  Add standard MVI and trace elements to TPN, no chromium on renal replacement Thiamine  removed as patient is not refeeding Continue Moderate q6h SSI and adjust as needed  Monitor TPN labs on Mon/Thurs, renal function panel + Mg ordered daily F/u advancement in diet s/p NGT removal  Thank you for involving pharmacy in this patient's care.  Heddy Liverpool, PharmD PGY2 Critical Care Pharmacy Resident 01/27/2024 7:14 AM

## 2024-01-27 NOTE — Progress Notes (Signed)
 PROGRESS NOTE    Alexander [name]  RUE:[id number] DOB: [birth year] DOA: 01/11/2024 PCP: Alexander Iba, PA  Subjective: Patient seen and examined. Remains on TPN.  RN reports that general surgery is going to order NG tube to be removed.  I&O are incorrectly documented. Documented that pt's 24 hour output was 140,100 ml.  That would be 140 Liters!!!     Hospital Course: HPI: Alexander [name] is a 63 y.o. male with medical history significant of Pe/DVT on coumadin , diastolic CHF. Presented with  bRBPR for 1 day. Weakness and syncope with blood per rectum and abd pain. In ER BRBPR large BM.  Was tachycardic and elevated WBC CT showed diverticulits.  HGB 8. Was not aware of fever or chills. 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    Reports abd pain has been ongoing for the past few days. Stools more loose today  Significant Events: Admitted 01/11/2024 for GI bleeding 4/16 to OR for ex lap, returned to ICU on vent 4/16 cardiorespiratory arrest, achieved ROSC.  Coagulopathy 4/17 blood products + multiple pressors  4/18 back to the OR 4/19 encephalopathic. Back to the OR this morning-s/p exploratory laparotomy, ileostomy creation, small bowel resection, wound VAC in place 01-18-2024 nephrology consulted for CRRT 4/21 weaning pressors  4/23 extubated; off pressors 01-24-2024 Transferred from [facility] ICU to [facility] progressive due to need for hemodialysis.  Antibiotic Therapy: Anti-infectives (From admission, onward)    Start     Dose/Rate Route Frequency Ordered Stop   01/18/24 1400  piperacillin -tazobactam (ZOSYN ) IVPB 3.375 g  Status:  Discontinued        3.375 g 12.5 mL/hr over 240 Minutes Intravenous Every 6 hours 01/18/24 1241 01/18/24 1242   01/18/24 1400  piperacillin -tazobactam (ZOSYN ) IVPB 3.375 g        3.375 g 100 mL/hr over 30 Minutes Intravenous Every 6 hours 01/18/24 1242 01/23/24 2046   01/18/24 0000  piperacillin -tazobactam (ZOSYN ) IVPB 2.25 g  Status:   Discontinued        2.25 g 100 mL/hr over 30 Minutes Intravenous Every 6 hours 01/17/24 2120 01/18/24 1241   01/12/24 0200  piperacillin -tazobactam (ZOSYN ) IVPB 3.375 g  Status:  Discontinued        3.375 g 12.5 mL/hr over 240 Minutes Intravenous Every 8 hours 01/11/24 2120 01/17/24 2120   01/11/24 1815  piperacillin -tazobactam (ZOSYN ) IVPB 3.375 g        3.375 g 100 mL/hr over 30 Minutes Intravenous  Once 01/11/24 1806 01/11/24 1921       Procedures: 01-15-2024 right hemicolectomy 01-16-2024 small bowel resection 01-18-2024 ileostomy, small bowel resection 01-18-2024 femoral HD catheter insertion  Consultants: PCCM GI General Surgery Nephrology    Assessment and Plan: * Spontaneous perforation of colon (HCC) 01-25-2024 s/p multiple laparotomies.  S/p right hemicolectomy, small bowel resection and ileostomy creation.  Pt remains with high output NG tube and ileostomy drainage. Remains on IV TPN. Patient transferred over to [facility] from [facility], ICU last night due to continued need for hemodialysis.  He had been on CRRT in the [facility] ICU.  Nephrology is determined he will need intermittent hemodialysis now.  This is not offered at [facility]. Patient initially admitted on 01/11/2024 due to rectal bleeding.  He was initially placed on IV antibiotics.  GI and nephrology were consulted.  He had tremendous blood loss.  He was taken the operating room on 01/15/2024.  He was found to have a colonic perforation.  He underwent right hemicolectomy. He suffered an acute cardiac arrest postoperatively about 8 hours after his surgery.  He had ROSC after about 4 minutes of CPR. He was taken back to surgery in the day on  01/16/2024 due to continued massive blood loss.  He had massive transfusion protocol initiated. He was taken the operating the third time on 01/18/2024 for ileostomy creation. He suffered worsening renal failure and became oliguric.  Nephrology was consulted on 01/18/2024 and  he started CRRT while in the ICU at [facility]. He was extubated on 01/22/2024.  His renal recovery has been nonexistent to slow. Nephrology is determined that he will need intermittent hemodialysis.  He was transferred from [facility], ICU to a progressive bed at [facility] on 01/24/2024 so that hemodialysis could be performed. Patient remains with high output NG tube and ileostomy output.  He remains on TPN.  He has been off antibiotics for several days now.  01-26-2024 surgery team aware of his leukocytosis of 24K.  Fluid collection noted on CT abd. Defer to surgery to deal with fluid collection. Remains off IV ABX per surgery.  Appears surgery team going to clamp his NG tube today and see how he does.  Hopefully he can get his NG tube out and start diet soon.  Continue with TPN for now.  01-27-2024 continue TPN for now. RN reports that general surgery is going to order NG tube to be removed.  Pt is on clear liquid diet. Defer to general surgery service to advance diet and stop TPN.  Surgery service needs to address the fluid collection seen on CT abd from 01-24-2024.  ??repositioning of abd drain?? Pt remains off IV ABX. WBC at 26K. Has been >20K for 3 days now.  AKI (acute kidney injury) (HCC) 01-25-2024 likely caused by a combination of acute blood loss anemia, hypotension due to cardiac arrest. Needed CRRT from 01-18-2024 through 01-24-2024. Pt transferred from [facility] ICU over to [facility] progressive bed in order to receive continued HD. Nephrology following. Hopeful for renal recovery.  01-26-2024 pt for HD today. Defer to nephrology when to get his right femoral HD catheter out and have permcath placed.  01-27-2024 nephrology holding off HD today. Had HD yesterday.  Unclear what exactly his urine output has been as it was documented he had 140 liters in output yesterday. He still has right femoral HD catheter. Will leave him on IV heparin  until it is decided what kind of HD catheter he is going to  need.  Acute blood loss anemia 01-25-2024 no longer bleeding. Due to elevated INR from coumadin , perforated colon. S/p 13 units PRBC since admission.  01-26-2024 HgB stable at 7.9  01-27-2024 HgB 8.2 g/dl today.  Iron  deficiency anemia 01-11-2024 through 01-25-2024  Folate normal, B12 severely low (93) -Ferritin low and iron  labs also low.  Venofer  repletion ordered x 3 days - Also repleting B12  01-26-2024 stable.  01-27-2024 stable.  Essential hypertension, benign 01-25-2024 off IV vasopressors. Prn IV labetalol   01-26-2024 stable.  01-27-2024 stable.  History of pulmonary embolus (PE) 01-25-2024 remains on IV heparin  gtts.  01-26-2024 remains on IV heparin  gtts.  01-27-2024 was on coumadin  at home. Currently on IV heparin . He still has right femoral HD catheter. Will leave him on IV heparin  until it is decided what kind of HD catheter he is going to need.   Coagulopathy (HCC)-resolved as of 01/26/2024 01-25-2024 due to coumadin , colonic perforation. Now resolved.  Acute hypoxic respiratory failure (HCC)-resolved as  of 01/26/2024 01-25-2024 was extubated on 01-22-2024. Currently on RA.  Rectal bleeding-resolved as of 01/26/2024 01-25-2024 due to perforated colon and elevated INR. Now resolved.   Hematochezia-resolved as of 01/27/2024 01-25-2024 due to perforated colon and elevated INR. Now resolved.  ABLA (acute blood loss anemia)-resolved as of 01/26/2024 - Due to ongoing rectal bleeding from presumed diverticulitis at this time - Has now required 5 units PRBCs since admission -Hgb 7.7 g/dL this morning -Will continue trending and transfuse as necessary  Diverticulitis-resolved as of 01/27/2024 01-25-2024 pt has completed IV abx.  Severe sepsis (HCC)-resolved as of 01/14/2024 - Fever, tachycardia, tachypnea, leukocytosis.  Presumed abdominal infection with suspected diverticulitis - Continue Zosyn  - stable now over past 24 hrs after tx to ICU  DVT prophylaxis:  SCD's Start: 01/15/24 1400  IV Heparin    Code Status: Full Code Family Communication: no family at bedside Disposition Plan: unknown Reason for continuing need for hospitalization: remains on IV TPN, IV heparin . Intermittent HD.  Objective: Vitals:   01/26/24 2351 01/27/24 0002 01/27/24 0332 01/27/24 0644  BP: 136/76 138/74    Pulse: 94 92    Resp: 18 (!) 22    Temp:  98.1 F (36.7 C) 99.9 F (37.7 C)   TempSrc:  Oral Oral   SpO2: 95% 94%    Weight:    81 kg  Height:        Intake/Output Summary (Last 24 hours) at 01/27/2024 1013 Last data filed at 01/27/2024 0943 Gross per 24 hour  Intake 955.75 ml  Output 142465 ml  Net -141509.25 ml   Filed Weights   01/25/24 0350 01/26/24 0620 01/27/24 0644  Weight: 78.6 kg 78.7 kg 81 kg    Examination:  Physical Exam Vitals and nursing note reviewed.  Constitutional:      General: He is not in acute distress.    Appearance: He is not toxic-appearing or diaphoretic.  HENT:     Head: Normocephalic and atraumatic.     Nose:     Comments: + NG tube Eyes:     General: No scleral icterus. Cardiovascular:     Rate and Rhythm: Normal rate and regular rhythm.  Pulmonary:     Effort: Pulmonary effort is normal.     Breath sounds: Normal breath sounds.  Abdominal:     General: There is no distension.     Comments: + abd drain +ileostomy with brown, thin output in bag.  Musculoskeletal:     Comments: Right internal jugular CVL Right femoral HD catheter  Skin:    General: Skin is warm and dry.     Capillary Refill: Capillary refill takes less than 2 seconds.  Neurological:     Mental Status: He is alert.     Comments: Slightly confused. Keeps on asking if we have his walking cane.     Data Reviewed: I have personally reviewed following labs and imaging studies  CBC: Recent Labs  Lab 01/22/24 1033 01/23/24 0505 01/24/24 0552 01/26/24 0610 01/27/24 0045  WBC 15.8* 18.5* 23.9* 24.6* 26.1*  HGB 9.3* 9.0* 8.5* 7.9*  8.2*  HCT 30.5* 28.5* 27.5* 24.8* 25.4*  MCV 95.9 94.4 95.5 92.5 91.4  PLT 139* 227 405* 641* 667*   Basic Metabolic Panel: Recent Labs  Lab 01/23/24 0505 01/24/24 0552 01/25/24 0540 01/26/24 0610 01/27/24 0045  NA 134* 138 140 142 139  K 3.6 3.5 3.6 3.4* 3.3*  CL 102 103 106 106 103  CO2 24 25 22  20* 25  GLUCOSE 154*  144* 153* 152* 108*  BUN 32* 57* 81* 97* 55*  CREATININE 1.94* 3.89* 5.21* 6.11* 3.91*  CALCIUM  7.1* 7.4* 7.6* 7.7* 7.8*  MG 2.5* 2.3 2.2 2.1 1.7  PHOS 2.7 4.5 4.7* 4.5 2.3*   GFR: Estimated Creatinine Clearance: 19.8 mL/min (A) (by C-G formula based on SCr of 3.91 mg/dL (H)). Liver Function Tests: Recent Labs  Lab 01/21/24 0504 01/21/24 1729 01/22/24 0417 01/22/24 1557 01/23/24 0505 01/24/24 0552 01/25/24 0540 01/26/24 0610 01/27/24 0045  AST 146*  --  85*  --  57* 41  --   --   --   ALT 415*  --  265*  --  179* 118*  --   --   --   ALKPHOS 83  --  78  --  69 63  --   --   --   BILITOT 1.9*  --  2.2*  --  2.2* 1.8*  --   --   --   PROT 5.1*  --  5.1*  --  5.4* 5.4*  --   --   --   ALBUMIN  1.7*  1.7*   < > 1.7*  1.7*   < > 1.7* 1.5*  1.6* <1.5* <1.5* <1.5*   < > = values in this interval not displayed.   Coagulation Profile: Recent Labs  Lab 01/21/24 0504 01/24/24 0552 01/26/24 0610 01/27/24 0045  INR 1.0 1.1 1.2 1.2   BNP (last 3 results) Recent Labs    07/10/23 1749  BNP 334.8*   CBG: Recent Labs  Lab 01/26/24 0606 01/26/24 1104 01/26/24 1614 01/27/24 0044 01/27/24 0626  GLUCAP 125* 140* 139* 109* 131*   Lipid Profile: Recent Labs    01/27/24 0045  TRIG 63   Scheduled Meds:  Chlorhexidine  Gluconate Cloth  6 each Topical Daily   insulin  aspart  0-15 Units Subcutaneous Q6H   mouth rinse  15 mL Mouth Rinse 4 times per day   pantoprazole  (PROTONIX ) IV  40 mg Intravenous Q12H   Continuous Infusions:  sodium chloride  Stopped (01/24/24 2213)   heparin  1,950 Units/hr (01/27/24 0229)   TPN ADULT (ION) 90 mL/hr at 01/26/24  2248     LOS: 16 days   Time spent: 45 minutes  Unk Garb, DO  [facility]  01/27/2024, 10:13 AM

## 2024-01-27 NOTE — Progress Notes (Signed)
  [facility] PROGRESS NOTE  Assessment/ Plan: Pt is a 63 y.o. yo male with past medical history of DVT, PE on Coumadin  admitted on 4/12 with rectal bleed and hypotension seen for AKI.  # AKI - mostly anuric, creat 1.0 early in this hospital stay. After onset of severe shock 4/16 pt had progressive AKI. UA negative. Renal US  shows no obstruction. AKI due to ATN primarily due to shock +/- IV contrast.  CRRT from 4/19-4/24. Urine output slowly increasing but creatinine rising.  Showing some moderate signs of improvement - Last HD on 4/27; will hold HD for now with daily assessment. UOP not bad at all and if we look at 0400-0400 he has 1200 ml of UOP.  - Consider tunneled dialysis catheter if patient has ongoing renal replacement therapy needs; currently patient with right femoral temporary catheter  #S/P cardiac arrest: 4/17  #Shock/ hypotension: Off of pressors now.  # Acute diverticulitis: w/ perforated ascending colon, s/p ileostomy, R hemicolectomy and some small bowel resection. Per gen surg/ pmd.  Advancing diet as able.  Per general surgery.  On TPN  #VDRF: Improved now extubated    Subjective: Patient states he feels great.  His fever chills nausea, shortness of breath; tolerating clears   Last  dialysis on 4/27.  Objective Vital signs in last 24 hours: Vitals:   01/26/24 2351 01/27/24 0002 01/27/24 0332 01/27/24 0644  BP: 136/76 138/74    Pulse: 94 92    Resp: 18 (!) 22    Temp:  98.1 F (36.7 C) 99.9 F (37.7 C)   TempSrc:  Oral Oral   SpO2: 95% 94%    Weight:    81 kg  Height:       Weight change: 2.3 kg  Intake/Output Summary (Last 24 hours) at 01/27/2024 0940 Last data filed at 01/27/2024 4098 Gross per 24 hour  Intake 975.25 ml  Output 141765 ml  Net -140789.75 ml       Labs: RENAL PANEL Recent Labs  Lab 01/23/24 0505 01/24/24 0552 01/25/24 0540 01/26/24 0610 01/27/24 0045  NA 134* 138 140 142 139  K 3.6 3.5 3.6 3.4*  3.3*  CL 102 103 106 106 103  CO2 24 25 22  20* 25  GLUCOSE 154* 144* 153* 152* 108*  BUN 32* 57* 81* 97* 55*  CREATININE 1.94* 3.89* 5.21* 6.11* 3.91*  CALCIUM  7.1* 7.4* 7.6* 7.7* 7.8*  MG 2.5* 2.3 2.2 2.1 1.7  PHOS 2.7 4.5 4.7* 4.5 2.3*  ALBUMIN  1.7* 1.5*  1.6* <1.5* <1.5* <1.5*    Liver Function Tests: Recent Labs  Lab 01/22/24 0417 01/22/24 1557 01/23/24 0505 01/24/24 0552 01/25/24 0540 01/26/24 0610 01/27/24 0045  AST 85*  --  57* 41  --   --   --   ALT 265*  --  179* 118*  --   --   --   ALKPHOS 78  --  69 63  --   --   --   BILITOT 2.2*  --  2.2* 1.8*  --   --   --   PROT 5.1*  --  5.4* 5.4*  --   --   --   ALBUMIN  1.7*  1.7*   < > 1.7* 1.5*  1.6* <1.5* <1.5* <1.5*   < > = values in this interval not displayed.   No results for input(s): "LIPASE", "AMYLASE" in the last 168 hours. No results for input(s): "AMMONIA" in the last 168 hours. CBC: Recent Labs  07/19/23 0448 07/22/23 0441 12/03/23 1437 12/04/23 1531 12/05/23 0036 01/11/24 1458 01/11/24 1957 01/12/24 0056 01/12/24 1044 01/12/24 1552 01/22/24 1033 01/23/24 0505 01/24/24 0552 01/26/24 0610 01/27/24 0045  HGB 10.1*   < > 7.6 Repeated and verified X2.*   < >  --    < > 6.7*   < >  --    < > 9.3* 9.0* 8.5* 7.9* 8.2*  MCV 92.7   < > 75.5*   < >  --    < > 76.6*   < >  --    < > 95.9 94.4 95.5 92.5 91.4  VITAMINB12 222  --   --   --  100*  --   --   --  93*  --   --   --   --   --   --   FOLATE 7.5  --   --   --  8.0  --   --   --  9.3  --   --   --   --   --   --   FERRITIN 25  --  4.5*  --  3*  --  19*  --   --   --   --   --   --   --   --   TIBC 328  --  382.2  --  364  --  293  --   --   --   --   --   --   --   --   IRON  25*  --  15*  --  11*  --  11*  --   --   --   --   --   --   --   --   RETICCTPCT 1.4  --   --   --  0.7  --  0.8  --   --   --   --   --   --   --   --    < > = values in this interval not displayed.    Cardiac Enzymes: No results for input(s): "CKTOTAL", "CKMB",  "CKMBINDEX", "TROPONINI" in the last 168 hours. CBG: Recent Labs  Lab 01/26/24 0606 01/26/24 1104 01/26/24 1614 01/27/24 0044 01/27/24 0626  GLUCAP 125* 140* 139* 109* 131*    Iron  Studies: No results for input(s): "IRON ", "TIBC", "TRANSFERRIN", "FERRITIN" in the last 72 hours. Studies/Results: No results found.    Medications: Infusions:  sodium chloride  Stopped (01/24/24 2213)   heparin  1,950 Units/hr (01/27/24 0229)   TPN ADULT (ION) 90 mL/hr at 01/26/24 2248    Scheduled Medications:  Chlorhexidine  Gluconate Cloth  6 each Topical Daily   insulin  aspart  0-15 Units Subcutaneous Q6H   mouth rinse  15 mL Mouth Rinse 4 times per day   pantoprazole  (PROTONIX ) IV  40 mg Intravenous Q12H    have reviewed scheduled and prn medications.  Physical Exam: GEN: sitting in bed, nad ENT: no nasal discharge, mmm EYES: no scleral icterus, eomi CV: normal rate, no murmurs PULM: no iwob, bilateral chest rise ABD: NABS, non-distended SKIN: no rashes or jaundice EXT: tr edema, warm and well perfused Access: rt fem temp   Loribeth Katich W 01/27/2024,9:40 AM  LOS: 16 days

## 2024-01-27 NOTE — Progress Notes (Signed)
 Physical Therapy Treatment Patient Details Name: Alexander [name] MRN: [id number] DOB: 02/05/1961 Today's Date: 01/27/2024   History of Present Illness Alexander [name] is a 63 y.o. male who .  He was admitted to [facility] on 01/11/2024 with rectal bleeding and hypotension.S/P exploratory laparotomy where he was found to have a perforated ascending colon with intra-abdominal abscess, right colectomy without anastomosis, placement of wound VAC, and drainage of intra-abdominal abscesses. CRRT started 4/19- 01/23/24 .   PMH: PE/DVT on Coumadin , GERD, R ankle arthroplasty    PT Comments  Pt resting in bed on arrival, agreeable to therapy session. Pt is still limited in activity tolerance due to fatigue and global weakness. Pt placed in bed chair position to increase upright tolerance during therapeutic exercises with focus on increased LE ROM and strength maintenance to progress bed mobility and tranfers abilities. Pt declining further exercises, bed mobility and transfer training. Pt continues to benefit from skilled PT services to progress toward functional mobility goals.     If plan is discharge home, recommend the following: Two people to help with walking and/or transfers;Two people to help with bathing/dressing/bathroom   Can travel by private vehicle        Equipment Recommendations   (TBA)    Recommendations for Other Services       Precautions / Restrictions Precautions Precautions: Fall Recall of Precautions/Restrictions: Impaired Precaution/Restrictions Comments: NG, Left radial art line, Right central line, R femoral HD catheter Restrictions Weight Bearing Restrictions Per Provider Order: No Other Position/Activity Restrictions: urinary catheter, NG tube, L radial arterial line, R femoral hemo-dialysis catheter     Mobility  Bed Mobility               General bed mobility comments: assisted in bed chair position, patient worked to reach up to the rail, patient unable to lean  forward in bed    Transfers                   General transfer comment: pt declining    Ambulation/Gait                   Stairs             Wheelchair Mobility     Tilt Bed    Modified Rankin (Stroke Patients Only)       Balance                                            Communication Communication Communication: Impaired Factors Affecting Communication: Reduced clarity of speech  Cognition Arousal: Alert Behavior During Therapy: Flat affect, Agitated   PT - Cognitive impairments: Attention, Initiation, Sequencing                       PT - Cognition Comments: pt becoming more agitated  as session progressed Following commands: Impaired Following commands impaired: Follows one step commands with increased time, Follows one step commands inconsistently    Cueing Cueing Techniques: Verbal cues, Tactile cues, Visual cues  Exercises General Exercises - Lower Extremity Ankle Circles/Pumps: AROM, 5 reps, Both, Supine Heel Slides: AAROM, Both, 5 reps Other Exercises Other Exercises: pulling self into long sitting x3 needing up to minA Other Exercises: long axis external/internal hip rotation    General Comments General comments (skin integrity, edema, etc.): VSS on RA  Pertinent Vitals/Pain Pain Assessment Pain Assessment: No/denies pain Pain Intervention(s): Monitored during session    Home Living                          Prior Function            PT Goals (current goals can now be found in the care plan section) Acute Rehab PT Goals PT Goal Formulation: With patient Time For Goal Achievement: 02/06/24 Progress towards PT goals: Not progressing toward goals - comment (due to fatigue, weakness, and poor participation.)    Frequency    Min 2X/week      PT Plan      Co-evaluation              AM-PAC PT "6 Clicks" Mobility   Outcome Measure  Help needed turning from your  back to your side while in a flat bed without using bedrails?: Total Help needed moving from lying on your back to sitting on the side of a flat bed without using bedrails?: Total Help needed moving to and from a bed to a chair (including a wheelchair)?: Total Help needed standing up from a chair using your arms (e.g., wheelchair or bedside chair)?: Total Help needed to walk in hospital room?: Total Help needed climbing 3-5 steps with a railing? : Total 6 Click Score: 6    End of Session   Activity Tolerance: Patient tolerated treatment well Patient left: in bed;with call bell/phone within reach;with bed alarm set;with family/visitor present Nurse Communication: Mobility status PT Visit Diagnosis: Unsteadiness on feet (R26.81);Muscle weakness (generalized) (M62.81);Other symptoms and signs involving the nervous system (R29.898);Difficulty in walking, not elsewhere classified (R26.2)     Time: 1610-9604 PT Time Calculation (min) (ACUTE ONLY): 14 min  Charges:    $Therapeutic Activity: 8-22 mins PT General Charges $$ ACUTE PT VISIT: 1 Visit                     Carlee Vonderhaar R. PTA Acute Rehabilitation Services Office: [phone]   Agapito Horseman 01/27/2024, 3:43 PM

## 2024-01-28 ENCOUNTER — Inpatient Hospital Stay (HOSPITAL_COMMUNITY)

## 2024-01-28 DIAGNOSIS — E861 Hypovolemia: Secondary | ICD-10-CM | POA: Diagnosis not present

## 2024-01-28 DIAGNOSIS — K631 Perforation of intestine (nontraumatic): Secondary | ICD-10-CM | POA: Diagnosis not present

## 2024-01-28 DIAGNOSIS — I10 Essential (primary) hypertension: Secondary | ICD-10-CM | POA: Diagnosis not present

## 2024-01-28 DIAGNOSIS — N179 Acute kidney failure, unspecified: Secondary | ICD-10-CM | POA: Diagnosis not present

## 2024-01-28 DIAGNOSIS — Z86711 Personal history of pulmonary embolism: Secondary | ICD-10-CM | POA: Diagnosis not present

## 2024-01-28 DIAGNOSIS — I959 Hypotension, unspecified: Secondary | ICD-10-CM | POA: Diagnosis not present

## 2024-01-28 HISTORY — PX: IR FLUORO GUIDE CV LINE RIGHT: IMG2283

## 2024-01-28 HISTORY — PX: IR US GUIDE VASC ACCESS RIGHT: IMG2390

## 2024-01-28 LAB — GLUCOSE, CAPILLARY
Glucose-Capillary: 89 mg/dL (ref 70–99)
Glucose-Capillary: 84 mg/dL (ref 70–99)
Glucose-Capillary: 46 mg/dL — ABNORMAL LOW (ref 70–99)
Glucose-Capillary: 52 mg/dL — ABNORMAL LOW (ref 70–99)
Glucose-Capillary: 77 mg/dL (ref 70–99)
Glucose-Capillary: 64 mg/dL — ABNORMAL LOW (ref 70–99)

## 2024-01-28 LAB — COMPREHENSIVE METABOLIC PANEL WITH GFR
ALT: 49 U/L — ABNORMAL HIGH (ref 0–44)
AST: 33 U/L (ref 15–41)
Albumin: <1.5 g/dL — ABNORMAL LOW (ref 3.5–5.0)
Alkaline Phosphatase: 65 U/L (ref 38–126)
Anion gap: 15 (ref 5–15)
BUN: 81 mg/dL — ABNORMAL HIGH (ref 8–23)
CO2: 21 mmol/L — ABNORMAL LOW (ref 22–32)
Calcium: 7.9 mg/dL — ABNORMAL LOW (ref 8.9–10.3)
Chloride: 104 mmol/L (ref 98–111)
Creatinine, Ser: 5.79 mg/dL — ABNORMAL HIGH (ref 0.61–1.24)
GFR, Estimated: 10 mL/min — ABNORMAL LOW (ref >60)
Glucose, Bld: 81 mg/dL (ref 70–99)
Potassium: 5.3 mmol/L — ABNORMAL HIGH (ref 3.5–5.1)
Sodium: 140 mmol/L (ref 135–145)
Total Bilirubin: 1.5 mg/dL — ABNORMAL HIGH (ref 0.0–1.2)
Total Protein: 5.8 g/dL — ABNORMAL LOW (ref 6.5–8.1)

## 2024-01-28 LAB — CBC WITH DIFFERENTIAL/PLATELET
Abs Immature Granulocytes: 0.42 10*3/uL — ABNORMAL HIGH (ref 0.00–0.07)
Basophils Absolute: 0.1 10*3/uL (ref 0.0–0.1)
Basophils Relative: 0 %
Eosinophils Absolute: 0.2 10*3/uL (ref 0.0–0.5)
Eosinophils Relative: 1 %
HCT: 19.8 % — ABNORMAL LOW (ref 39.0–52.0)
Hemoglobin: 6.3 g/dL — CL (ref 13.0–17.0)
Immature Granulocytes: 1 %
Lymphocytes Relative: 5 %
Lymphs Abs: 1.5 10*3/uL (ref 0.7–4.0)
MCH: 30.1 pg (ref 26.0–34.0)
MCHC: 31.8 g/dL (ref 30.0–36.0)
MCV: 94.7 fL (ref 80.0–100.0)
Monocytes Absolute: 2.1 10*3/uL — ABNORMAL HIGH (ref 0.1–1.0)
Monocytes Relative: 7 %
Neutro Abs: 25.1 10*3/uL — ABNORMAL HIGH (ref 1.7–7.7)
Neutrophils Relative %: 86 %
Platelets: 628 10*3/uL — ABNORMAL HIGH (ref 150–400)
RBC: 2.09 MIL/uL — ABNORMAL LOW (ref 4.22–5.81)
RDW: 19.8 % — ABNORMAL HIGH (ref 11.5–15.5)
Smear Review: NORMAL
WBC: 29.3 10*3/uL — ABNORMAL HIGH (ref 4.0–10.5)
nRBC: 0 % (ref 0.0–0.2)

## 2024-01-28 LAB — LACTIC ACID, PLASMA: Lactic Acid, Venous: 1.6 mmol/L (ref 0.5–1.9)

## 2024-01-28 LAB — PREPARE RBC (CROSSMATCH)

## 2024-01-28 LAB — MAGNESIUM: Magnesium: 2 mg/dL (ref 1.7–2.4)

## 2024-01-28 MED ORDER — MIDAZOLAM HCL 2 MG/2ML IJ SOLN
INTRAMUSCULAR | Status: AC | PRN
Start: 2024-01-28 — End: 2024-01-28
  Administered 2024-01-28: 1 mg via INTRAVENOUS

## 2024-01-28 MED ORDER — DEXTROSE 50 % IV SOLN
25.0000 g | INTRAVENOUS | Status: DC
  Filled 2024-01-28: qty 50

## 2024-01-28 MED ORDER — DEXTROSE 50 % IV SOLN
INTRAVENOUS | Status: AC
  Filled 2024-01-28: qty 50

## 2024-01-28 MED ORDER — LIDOCAINE-EPINEPHRINE 1 %-1:100000 IJ SOLN
20.0000 mL | Freq: Once | INTRAMUSCULAR | Status: AC
  Administered 2024-01-28: 10 mL via INTRADERMAL

## 2024-01-28 MED ORDER — METOPROLOL TARTRATE 5 MG/5ML IV SOLN
5.0000 mg | Freq: Once | INTRAVENOUS | Status: AC
  Administered 2024-01-28: 5 mg via INTRAVENOUS
  Filled 2024-01-28: qty 5

## 2024-01-28 MED ORDER — SODIUM CHLORIDE 0.9 % IV SOLN: INTRAVENOUS | Status: DC

## 2024-01-28 MED ORDER — ONDANSETRON HCL 4 MG/2ML IJ SOLN
4.0000 mg | Freq: Four times a day (QID) | INTRAMUSCULAR | Status: DC | PRN
  Administered 2024-01-28: 4 mg via INTRAVENOUS
  Filled 2024-01-28: qty 2

## 2024-01-28 MED ORDER — PIPERACILLIN-TAZOBACTAM IN DEX 2-0.25 GM/50ML IV SOLN
2.2500 g | Freq: Three times a day (TID) | INTRAVENOUS | Status: DC
  Administered 2024-01-28 – 2024-02-02 (×16): 2.25 g via INTRAVENOUS
  Filled 2024-01-28 (×17): qty 50

## 2024-01-28 MED ORDER — SODIUM CHLORIDE 0.9% IV SOLUTION: Freq: Once | INTRAVENOUS | Status: AC

## 2024-01-28 MED ORDER — NOREPINEPHRINE 4 MG/250ML-% IV SOLN
0.0000 ug/min | INTRAVENOUS | Status: DC
  Filled 2024-01-28: qty 250

## 2024-01-28 MED ORDER — LORAZEPAM 2 MG/ML IJ SOLN: 1.0000 mg | INTRAMUSCULAR | Status: DC | PRN

## 2024-01-28 MED ORDER — DEXTROSE 50 % IV SOLN
12.5000 g | INTRAVENOUS | Status: AC
  Administered 2024-01-28: 12.5 g via INTRAVENOUS

## 2024-01-28 MED ORDER — HYDROMORPHONE HCL 1 MG/ML IJ SOLN
0.5000 mg | INTRAMUSCULAR | Status: DC | PRN
  Administered 2024-01-28: 1.5 mg via INTRAVENOUS
  Administered 2024-01-28 – 2024-01-29 (×3): 1 mg via INTRAVENOUS
  Administered 2024-01-29: 1.5 mg via INTRAVENOUS
  Administered 2024-01-29: 1 mg via INTRAVENOUS
  Filled 2024-01-28 (×2): qty 1
  Filled 2024-01-28 (×3): qty 1.5
  Filled 2024-01-28: qty 1

## 2024-01-28 MED ORDER — PANTOPRAZOLE SODIUM 40 MG IV SOLR
40.0000 mg | INTRAVENOUS | Status: DC
  Administered 2024-01-28 – 2024-01-29 (×2): 40 mg via INTRAVENOUS
  Filled 2024-01-28 (×2): qty 10

## 2024-01-28 MED ORDER — LIDOCAINE-EPINEPHRINE 1 %-1:100000 IJ SOLN
INTRAMUSCULAR | Status: AC
  Filled 2024-01-28: qty 1

## 2024-01-28 MED ORDER — MIDAZOLAM HCL 2 MG/2ML IJ SOLN
INTRAMUSCULAR | Status: AC
  Filled 2024-01-28: qty 4

## 2024-01-28 MED ORDER — FENTANYL CITRATE (PF) 100 MCG/2ML IJ SOLN
INTRAMUSCULAR | Status: AC
  Filled 2024-01-28: qty 4

## 2024-01-28 MED ORDER — LACTATED RINGERS IV BOLUS
500.0000 mL | Freq: Once | INTRAVENOUS | Status: AC
  Administered 2024-01-28: 500 mL via INTRAVENOUS

## 2024-01-28 MED ORDER — SODIUM CHLORIDE 0.9 % IV BOLUS
500.0000 mL | Freq: Once | INTRAVENOUS | Status: AC
  Administered 2024-01-28: 500 mL via INTRAVENOUS

## 2024-01-28 NOTE — Progress Notes (Addendum)
   CT report reviewed with general surgery. Has another fluid collection in his abd that is new. Surgery wants pt back on TPN. CVL was removed yesterday.  Pt will need another CVL. Consult IR for CVL.  Surgery to ask IR to place another abd drain into new fluid collection.  Pt made NPO again.  Right femoral HD catheter removed by nephrology earlier today.  I've updated pt's son Darius regarding need for CVL, another abd drain and pt's worsening condition.  Unk Garb, DO [facility]

## 2024-01-28 NOTE — Progress Notes (Signed)
 Alexander [name]  Assessment/ Plan: Pt is a 63 y.o. yo male with past medical history of DVT, PE on Coumadin  admitted on 4/12 with rectal bleed and hypotension seen for AKI.  # AKI - mostly anuric, creat 1.0 early in this hospital stay. After onset of severe shock 4/16 pt had progressive AKI. UA negative. Renal US  shows no obstruction. AKI due to ATN primarily due to shock +/- IV contrast.  CRRT from 4/19-4/24. Urine output slowly increasing but creatinine rising.  Showing some moderate signs of improvement - Last HD on 4/27; will hold HD for now with daily assessment. UOP was sun-mon from 0400-0400 and over past 24hrs .  - Patient does not want dialysis unless absolutely necessary and with fevers, great UOP let's just pull the dialysis catheter   #S/P cardiac arrest: 4/17  #Shock/ hypotension: Off of pressors now.  # Acute diverticulitis: [name]/ perforated ascending colon, s/p ileostomy, R hemicolectomy and some small bowel resection. Per gen surg/ pmd.  Advancing diet as able.  Per general surgery.  On TPN  #VDRF: Improved now extubated    Subjective: Patient states he feels great.  His fever chills nausea, shortness of breath; tolerating clears   Last  dialysis on 4/27.  Objective Vital signs in last 24 hours: Vitals:   01/28/24 0000 01/28/24 0331 01/28/24 0500 01/28/24 0717  BP: 124/71 121/73 118/76 110/68  Pulse: (!) 119 (!) 117    Resp: 18 (!) 24 18 15   Temp:  (!) 97.5 F (36.4 C)  98.3 F (36.8 C)  TempSrc:  Oral  Oral  SpO2: 93% 94%  94%  Weight:   79.8 kg   Height:       Weight change: -1.2 kg  Intake/Output Summary (Last 24 hours) at 01/28/2024 1044 Last data filed at 01/28/2024 0549 Gross per 24 hour  Intake 1841.67 ml  Output 2280 ml  Net -438.33 ml       Labs: RENAL PANEL Recent Labs  Lab 01/23/24 0505 01/24/24 0552 01/25/24 0540 01/26/24 0610 01/27/24 0045  NA 134* 138 140 142 139  K 3.6 3.5 3.6  3.4* 3.3*  CL 102 103 106 106 103  CO2 24 25 22  20* 25  GLUCOSE 154* 144* 153* 152* 108*  BUN 32* 57* 81* 97* 55*  CREATININE 1.94* 3.89* 5.21* 6.11* 3.91*  CALCIUM  7.1* 7.4* 7.6* 7.7* 7.8*  MG 2.5* 2.3 2.2 2.1 1.7  PHOS 2.7 4.5 4.7* 4.5 2.3*  ALBUMIN  1.7* 1.5*  1.6* <1.5* <1.5* <1.5*    Liver Function Tests: Recent Labs  Lab 01/22/24 0417 01/22/24 1557 01/23/24 0505 01/24/24 0552 01/25/24 0540 01/26/24 0610 01/27/24 0045  AST 85*  --  57* 41  --   --   --   ALT 265*  --  179* 118*  --   --   --   ALKPHOS 78  --  69 63  --   --   --   BILITOT 2.2*  --  2.2* 1.8*  --   --   --   PROT 5.1*  --  5.4* 5.4*  --   --   --   ALBUMIN  1.7*  1.7*   < > 1.7* 1.5*  1.6* <1.5* <1.5* <1.5*   < > = values in this interval not displayed.   No results for input(s): "LIPASE", "AMYLASE" in the last 168 hours. No results for input(s): "AMMONIA" in the last 168 hours. CBC: Recent Labs  07/19/23 0448 07/22/23 0441 12/03/23 1437 12/04/23 1531 12/05/23 0036 01/11/24 1458 01/11/24 1957 01/12/24 0056 01/12/24 1044 01/12/24 1552 01/22/24 1033 01/23/24 0505 01/24/24 0552 01/26/24 0610 01/27/24 0045  HGB 10.1*   < > 7.6 Repeated and verified X2.*   < >  --    < > 6.7*   < >  --    < > 9.3* 9.0* 8.5* 7.9* 8.2*  MCV 92.7   < > 75.5*   < >  --    < > 76.6*   < >  --    < > 95.9 94.4 95.5 92.5 91.4  VITAMINB12 222  --   --   --  100*  --   --   --  93*  --   --   --   --   --   --   FOLATE 7.5  --   --   --  8.0  --   --   --  9.3  --   --   --   --   --   --   FERRITIN 25  --  4.5*  --  3*  --  19*  --   --   --   --   --   --   --   --   TIBC 328  --  382.2  --  364  --  293  --   --   --   --   --   --   --   --   IRON  25*  --  15*  --  11*  --  11*  --   --   --   --   --   --   --   --   RETICCTPCT 1.4  --   --   --  0.7  --  0.8  --   --   --   --   --   --   --   --    < > = values in this interval not displayed.    Cardiac Enzymes: No results for input(s): "CKTOTAL", "CKMB",  "CKMBINDEX", "TROPONINI" in the last 168 hours. CBG: Recent Labs  Lab 01/27/24 0626 01/27/24 1116 01/27/24 1630 01/27/24 2224 01/28/24 0547  GLUCAP 131* 141* 89 92 89    Iron  Studies: No results for input(s): "IRON ", "TIBC", "TRANSFERRIN", "FERRITIN" in the last 72 hours. Studies/Results: No results found.    Medications: Infusions:  sodium chloride  Stopped (01/24/24 2213)   dextrose  5 % and 0.45 % NaCl Stopped (01/28/24 0906)   heparin  Stopped (01/28/24 5409)   piperacillin -tazobactam 2.25 g (01/28/24 1031)    Scheduled Medications:  Chlorhexidine  Gluconate Cloth  6 each Topical Daily   feeding supplement  237 mL Oral BID BM   mouth rinse  15 mL Mouth Rinse 4 times per day   pantoprazole   40 mg Oral Daily    have reviewed scheduled and prn medications.  Physical Exam: GEN: sitting in bed, nad ENT: no nasal discharge, mmm EYES: no scleral icterus, eomi CV: normal rate, no murmurs PULM: no iwob, bilateral chest rise ABD: NABS, non-distended SKIN: no rashes or jaundice EXT: tr edema, warm and well perfused Access: rt fem temp   Alexander [name] 01/28/2024,10:44 AM  LOS: 17 days

## 2024-01-28 NOTE — Procedures (Signed)
 Interventional Radiology Procedure Note  Procedure: Placement of a right IJ approach triple lumen CVC.  Tip is positioned at the superior cavoatrial junction and catheter is ready for immediate use.   Complications: None  Recommendations:  - Ok to use - Do not submerge - Routine line care   Signed,  Marciano Settles. Mabel Savage, DO, ABVM, RPVI

## 2024-01-28 NOTE — Progress Notes (Addendum)
 PROGRESS NOTE    Alexander [name]  ZOX:[id number] DOB: 1961-04-22 DOA: 01/11/2024 PCP: Alexander Iba, PA  Subjective: Patient seen and examined. Pt is lethargic this AM. Low grade fever last night.  TPN stopped and right internal jugular CVL removed. CVL tip sent for cx.  Blood cx ordered but pt refused them.  Pt tachycardic this AM with HR in the 130.  Sinus tach on monitor.  Discussed with general surgery. Pt has been off IV abx since 01-23-2024.  He has had persistent leukocytosis.  After discussion with general surgery, will restart IV zosyn . Surgery planning repeat CT abd and potential for repeat/repositioning of abd drain.  Have message nephrology to see if pt's right femoral HD catheter can be removed today. Urine output increasing. Had 1650 ml in urine last 24 hours. Ileostomy output 1500 ml in last 24 hours.   Hospital Course: HPI: Alexander [name] is a 63 y.o. male with medical history significant of Pe/DVT on coumadin , diastolic CHF. Presented with  bRBPR for 1 day. Weakness and syncope with blood per rectum and abd pain. In ER BRBPR large BM.  Was tachycardic and elevated WBC CT showed diverticulits.  HGB 8. Was not aware of fever or chills. 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    Reports abd pain has been ongoing for the past few days. Stools more loose today  Significant Events: Admitted 01/11/2024 for GI bleeding 4/16 to OR for ex lap, returned to ICU on vent 4/16 cardiorespiratory arrest, achieved ROSC.  Coagulopathy 4/17 blood products + multiple pressors  4/18 back to the OR 4/19 encephalopathic. Back to the OR this morning-s/p exploratory laparotomy, ileostomy creation, small bowel resection, wound VAC in place 01-18-2024 nephrology consulted for CRRT 4/21 weaning pressors  4/23 extubated; off pressors 01-24-2024 Transferred from [facility] ICU to [facility] progressive due to need for hemodialysis.  Antibiotic Therapy: Anti-infectives (From admission,  onward)    Start     Dose/Rate Route Frequency Ordered Stop   01/18/24 1400  piperacillin -tazobactam (ZOSYN ) IVPB 3.375 g  Status:  Discontinued        3.375 g 12.5 mL/hr over 240 Minutes Intravenous Every 6 hours 01/18/24 1241 01/18/24 1242   01/18/24 1400  piperacillin -tazobactam (ZOSYN ) IVPB 3.375 g        3.375 g 100 mL/hr over 30 Minutes Intravenous Every 6 hours 01/18/24 1242 01/23/24 2046   01/18/24 0000  piperacillin -tazobactam (ZOSYN ) IVPB 2.25 g  Status:  Discontinued        2.25 g 100 mL/hr over 30 Minutes Intravenous Every 6 hours 01/17/24 2120 01/18/24 1241   01/12/24 0200  piperacillin -tazobactam (ZOSYN ) IVPB 3.375 g  Status:  Discontinued        3.375 g 12.5 mL/hr over 240 Minutes Intravenous Every 8 hours 01/11/24 2120 01/17/24 2120   01/11/24 1815  piperacillin -tazobactam (ZOSYN ) IVPB 3.375 g        3.375 g 100 mL/hr over 30 Minutes Intravenous  Once 01/11/24 1806 01/11/24 1921       Procedures: 01-15-2024 right hemicolectomy 01-16-2024 small bowel resection 01-18-2024 ileostomy, small bowel resection 01-18-2024 femoral HD catheter insertion 01-27-2024 CVL removed. Tip sent for culture 01-28-2024 right femoral CVL removed.  Consultants: [facility] GI General Surgery Nephrology    Assessment and Plan: * Spontaneous perforation of colon (HCC) 01-25-2024 s/p multiple laparotomies.  S/p right hemicolectomy, small bowel resection and ileostomy creation.  Pt remains with high output NG tube and ileostomy drainage. Remains on IV TPN.  Patient transferred over to [facility] from [facility], ICU last night due to continued need for hemodialysis.  He had been on CRRT in the [facility] ICU.  Nephrology is determined he will need intermittent hemodialysis now.  This is not offered at [facility]. Patient initially admitted on 01/11/2024 due to rectal bleeding.  He was initially placed on IV antibiotics.  GI and nephrology were consulted.  He had tremendous blood loss.  He was taken  the operating room on 01/15/2024.  He was found to have a colonic perforation.  He underwent right hemicolectomy. He suffered an acute cardiac arrest postoperatively about 8 hours after his surgery.  He had ROSC after about 4 minutes of CPR. He was taken back to surgery in the day on  01/16/2024 due to continued massive blood loss.  He had massive transfusion protocol initiated. He was taken the operating the third time on 01/18/2024 for ileostomy creation. He suffered worsening renal failure and became oliguric.  Nephrology was consulted on 01/18/2024 and he started CRRT while in the ICU at [facility]. He was extubated on 01/22/2024.  His renal recovery has been nonexistent to slow. Nephrology is determined that he will need intermittent hemodialysis.  He was transferred from [facility], ICU to a progressive bed at [facility] on 01/24/2024 so that hemodialysis could be performed. Patient remains with high output NG tube and ileostomy output.  He remains on TPN.  He has been off antibiotics for several days now.  01-26-2024 surgery team aware of his leukocytosis of 24K.  Fluid collection noted on CT abd. Defer to surgery to deal with fluid collection. Remains off IV ABX per surgery.  Appears surgery team going to clamp his NG tube today and see how he does.  Hopefully he can get his NG tube out and start diet soon.  Continue with TPN for now.  01-27-2024 continue TPN for now. RN reports that general surgery is going to order NG tube to be removed.  Pt is on clear liquid diet. Defer to general surgery service to advance diet and stop TPN.  Surgery service needs to address the fluid collection seen on CT abd from 01-24-2024.  ??repositioning of abd drain?? Pt remains off IV ABX. WBC at 26K. Has been >20K for 3 days now.  01-28-2024 discussed with general surgery. Will restart IV zosyn . Surgery service ordering another CT abd, potential for repositioning/insertion of abd drain to help with persistent abd fluid  collection. Right internal jugular CVL removed yesterday. Asking nephrology if right femoral HD catheter can be removed today.   AKI (acute kidney injury) (HCC) 01-25-2024 likely caused by a combination of acute blood loss anemia, hypotension due to cardiac arrest. Needed CRRT from 01-18-2024 through 01-24-2024. Pt transferred from [facility] ICU over to [facility] progressive bed in order to receive continued HD. Nephrology following. Hopeful for renal recovery.  01-26-2024 pt for HD today. Defer to nephrology when to get his right femoral HD catheter out and have permcath placed.  01-27-2024 nephrology holding off HD today. Had HD yesterday.  Unclear what exactly his urine output has been as it was documented he had 140 liters in output yesterday. He still has right femoral HD catheter. Will leave him on IV heparin  until it is decided what kind of HD catheter he is going to need.  01-28-2024 awaiting AM labs. Had 1650 in urine output yesterday. Renal recovery seems like it is progressing. Nephrology following.  Acute blood loss anemia 01-25-2024 no  longer bleeding. Due to elevated INR from coumadin , perforated colon. S/p 13 units PRBC since admission.  01-26-2024 HgB stable at 7.9  01-27-2024 HgB 8.2 g/dl today.  01-28-2024 awaiting AM CBC today.  Iron  deficiency anemia 01-11-2024 through 01-25-2024  Folate normal, B12 severely low (93) -Ferritin low and iron  labs also low.  Venofer  repletion ordered x 3 days - Also repleting B12  01-26-2024 stable.  01-27-2024 stable.  01-28-2024 stable  Essential hypertension, benign 01-25-2024 off IV vasopressors. Prn IV labetalol   01-26-2024 stable.  01-27-2024 stable.  01-28-2024 stable  History of pulmonary embolus (PE) 01-25-2024 remains on IV heparin  gtts.  01-26-2024 remains on IV heparin  gtts.  01-27-2024 was on coumadin  at home. Currently on IV heparin . He still has right femoral HD catheter. Will leave him on IV heparin  until it is decided  what kind of HD catheter he is going to need.  01-28-2024 remains on IV heparin  gtts. Will hold while he gets CT abd and awaiting decision for abd drain repositioning/insertion by surgery service.   Resolved Hospital Problems: Coagulopathy (HCC)-resolved as of 01/26/2024 01-25-2024 due to coumadin , colonic perforation. Now resolved.  Acute hypoxic respiratory failure (HCC)-resolved as of 01/26/2024 01-25-2024 was extubated on 01-22-2024. Currently on RA.  Rectal bleeding-resolved as of 01/26/2024 01-25-2024 due to perforated colon and elevated INR. Now resolved.   Hematochezia-resolved as of 01/27/2024 01-25-2024 due to perforated colon and elevated INR. Now resolved.  ABLA (acute blood loss anemia)-resolved as of 01/26/2024 - Due to ongoing rectal bleeding from presumed diverticulitis at this time - Has now required 5 units PRBCs since admission -Hgb 7.7 g/dL this morning -Will continue trending and transfuse as necessary  Diverticulitis-resolved as of 01/27/2024 01-25-2024 pt has completed IV abx.  Severe sepsis (HCC)-resolved as of 01/14/2024 - Fever, tachycardia, tachypnea, leukocytosis.  Presumed abdominal infection with suspected diverticulitis - Continue Zosyn  - stable now over past 24 hrs after tx to ICU   DVT prophylaxis: SCD's Start: 01/15/24 1400  IV Heparin    Code Status: Full Code Family Communication: no family at bedside Disposition Plan: unknown. ??LTAC Reason for continuing need for hospitalization: remains on IV heparin  gtts. Restarting  IV zosyn  today. Repeat abd imaging with CT today  Objective: Vitals:   01/28/24 0000 01/28/24 0331 01/28/24 0500 01/28/24 0717  BP: 124/71 121/73 118/76 110/68  Pulse: (!) 119 (!) 117    Resp: 18 (!) 24 18 15   Temp:  (!) 97.5 F (36.4 C)  98.3 F (36.8 C)  TempSrc:  Oral  Oral  SpO2: 93% 94%  94%  Weight:   79.8 kg   Height:        Intake/Output Summary (Last 24 hours) at 01/28/2024 1128 Last data filed at 01/28/2024  0549 Gross per 24 hour  Intake 1841.67 ml  Output 2280 ml  Net -438.33 ml   Filed Weights   01/26/24 0620 01/27/24 0644 01/28/24 0500  Weight: 78.7 kg 81 kg 79.8 kg    Examination:  Physical Exam Vitals and nursing note reviewed.  Constitutional:      Comments: Lethargic today. Sinus tachycardia on telemetry monitor. Looks worse today.  HENT:     Head: Normocephalic and atraumatic.  Cardiovascular:     Rate and Rhythm: Regular rhythm. Tachycardia present.  Pulmonary:     Effort: Pulmonary effort is normal.  Abdominal:     General: There is no distension.     Comments: Abd wound looks healthy +brown liquid in ileostomy bag +clear yellow urine in foley bag  Musculoskeletal:     Right lower leg: No edema.     Left lower leg: No edema.  Skin:    Capillary Refill: Capillary refill takes less than 2 seconds.  Neurological:     Comments: Lethargic. Unable to answer any questions.     Data Reviewed: I have personally reviewed following labs and imaging studies  CBC: Recent Labs  Lab 01/22/24 1033 01/23/24 0505 01/24/24 0552 01/26/24 0610 01/27/24 0045  WBC 15.8* 18.5* 23.9* 24.6* 26.1*  HGB 9.3* 9.0* 8.5* 7.9* 8.2*  HCT 30.5* 28.5* 27.5* 24.8* 25.4*  MCV 95.9 94.4 95.5 92.5 91.4  PLT 139* 227 405* 641* 667*   Basic Metabolic Panel: Recent Labs  Lab 01/23/24 0505 01/24/24 0552 01/25/24 0540 01/26/24 0610 01/27/24 0045  NA 134* 138 140 142 139  K 3.6 3.5 3.6 3.4* 3.3*  CL 102 103 106 106 103  CO2 24 25 22  20* 25  GLUCOSE 154* 144* 153* 152* 108*  BUN 32* 57* 81* 97* 55*  CREATININE 1.94* 3.89* 5.21* 6.11* 3.91*  CALCIUM  7.1* 7.4* 7.6* 7.7* 7.8*  MG 2.5* 2.3 2.2 2.1 1.7  PHOS 2.7 4.5 4.7* 4.5 2.3*   GFR: Estimated Creatinine Clearance: 19.8 mL/min (A) (by C-G formula based on SCr of 3.91 mg/dL (H)). Liver Function Tests: Recent Labs  Lab 01/22/24 0417 01/22/24 1557 01/23/24 0505 01/24/24 0552 01/25/24 0540 01/26/24 0610 01/27/24 0045  AST 85*   --  57* 41  --   --   --   ALT 265*  --  179* 118*  --   --   --   ALKPHOS 78  --  69 63  --   --   --   BILITOT 2.2*  --  2.2* 1.8*  --   --   --   PROT 5.1*  --  5.4* 5.4*  --   --   --   ALBUMIN  1.7*  1.7*   < > 1.7* 1.5*  1.6* <1.5* <1.5* <1.5*   < > = values in this interval not displayed.   Coagulation Profile: Recent Labs  Lab 01/24/24 0552 01/26/24 0610 01/27/24 0045  INR 1.1 1.2 1.2   BNP (last 3 results) Recent Labs    07/10/23 1749  BNP 334.8*   CBG: Recent Labs  Lab 01/27/24 0626 01/27/24 1116 01/27/24 1630 01/27/24 2224 01/28/24 0547  GLUCAP 131* 141* 89 92 89   Lipid Profile: Recent Labs    01/27/24 0045  TRIG 63   Recent Results (from the past 240 hours)  Cath Tip Culture     Status: None (Preliminary result)   Collection Time: 01/27/24  8:40 PM   Specimen: Catheter Tip; Other  Result Value Ref Range Status   Specimen Description CATH TIP  Final   Special Requests ONLY REC'D BLUE TIP OF CATHETER  Final   Culture   Final    NO GROWTH < 12 HOURS Performed at [facility], [address] [place], [place] [address]    Report Status PENDING  Incomplete     Scheduled Meds:  Chlorhexidine  Gluconate Cloth  6 each Topical Daily   feeding supplement  237 mL Oral BID BM   mouth rinse  15 mL Mouth Rinse 4 times per day   pantoprazole   40 mg Oral Daily   Continuous Infusions:  sodium chloride  Stopped (01/24/24 2213)   dextrose  5 % and 0.45 % NaCl Stopped (01/28/24 0906)   heparin  1,950 Units/hr (01/28/24 1059)  piperacillin -tazobactam 2.25 g (01/28/24 1031)     LOS: 17 days   Time spent: 50 minutes  Unk Garb, DO  [facility]  01/28/2024, 11:28 AM

## 2024-01-28 NOTE — Progress Notes (Addendum)
   Called pt's son Darius. Informed him that pt withdrew consent for abd drain placement.  Had previously discussed with Dr. Jinx Mourning with IR that pt's fluid collection in his abd may be a hematoma. IV heparin  gtts discontinued.  Unk Garb, DO [facility]

## 2024-01-28 NOTE — Plan of Care (Signed)
  Problem: Nutrition: Goal: Adequate nutrition will be maintained Outcome: Progressing   Problem: Elimination: Goal: Will not experience complications related to bowel motility Outcome: Progressing Goal: Will not experience complications related to urinary retention Outcome: Progressing   Problem: Pain Managment: Goal: General experience of comfort will improve and/or be controlled Outcome: Progressing   Problem: Safety: Goal: Ability to remain free from injury will improve Outcome: Progressing   Problem: Skin Integrity: Goal: Risk for impaired skin integrity will decrease Outcome: Progressing   Problem: Respiratory: Goal: Ability to maintain a clear airway and adequate ventilation will improve Outcome: Progressing

## 2024-01-28 NOTE — Progress Notes (Signed)
 NAME:  Alexander [name], MRN:  [id number], DOB:  02/14/1961, LOS: 17 ADMISSION DATE:  01/11/2024, CONSULTATION DATE:  01/15/24 REFERRING MD:  Dr Elvan Hamel, CHIEF COMPLAINT:  vent management   History of Present Illness:  Pt is encephelopathic; therefore, this HPI is obtained from chart review. Alexander [name] is a 63 y.o. male who has a PMH as outlined below including a limited to PE/DVT on Coumadin .  He was admitted to [facility] on 01/11/2024 with rectal bleeding and hypotension.  Symptoms had started 1 day earlier and had persisted.   CT in the ED was concerning for diverticulitis versus colitis versus neoplasm.  He was started on Zosyn  and his Coumadin  was held.    He was evaluated by gastroenterology who recommended antibiotic therapy and conservative management in the interim with possible EGD/colonoscopy at a later date (possibly 4 to 6 weeks after completion of 10 days of antibiotics alone as he did not become transfusion dependent).  Unfortunately after his admission, he required multiple transfusions for persistent anemia and bleeding.  He had repeat CT scan on 4/15 that demonstrated ongoing inflammation and a large contained perforation without abscess and pneumoperitoneum, increased gaseous distention of small and large bowel distention consistent with ileus.  He was evaluated by general surgery who initially opted for conservative management.  Unfortunately he had ongoing abdominal pain and unfortunately 16, decision was made to go to the OR for further evaluation management.   He was taken to the OR and had an exploratory laparotomy where he was found to have a perforated ascending colon with intra-abdominal abscess and ultimately underwent with right colectomy without anastomosis, placement of wound VAC, and drainage of intra-abdominal abscesses.  He was left open and plan was for return trip to the OR on Friday/18.   Return to the ICU on the ventilator, and PCCM was asked to assist with ventilator and  medical management.  Pertinent  Medical History  has Elevated uric acid in blood; Chronic GERD; Chronic foot pain, right; Chronic pain of right ankle; Acute pulmonary embolism (HCC); DVT (deep venous thrombosis) (HCC); Essential hypertension, benign; Gout; Pulmonary embolus (HCC); Hepatic steatosis; Spinal stenosis; Aortic atherosclerosis (HCC); Peripheral arterial disease (HCC); Sleep disturbance; Diverticulosis; High risk medication use; S/P surgical manipulation of ankle joint; Hyperlipidemia; Noncompliance; Impaired fasting blood sugar; Chronic diarrhea; Pulmonary hypertension (HCC); Cor pulmonale (HCC); Chronic thromboembolic disease (HCC); Thrombocytopenia (HCC); History of pulmonary embolism; Diverticulitis; Symptomatic anemia; AKI (acute kidney injury) (HCC); Lower GI bleed; Iron  deficiency anemia; Diverticulitis of colon; ABLA (acute blood loss anemia); Hematochezia; Acute blood loss anemia; Rectal bleeding; Generalized abdominal pain; and Colon perforation (HCC) on their problem list.   Significant Hospital Events: Including procedures, antibiotic start and stop dates in addition to other pertinent events   4/14 admit 4/16 to OR for ex lap, returned to ICU on vent 4/16 cardiorespiratory arrest, achieved ROSC.  Coagulopathy 4/17 blood products + multiple pressors  4/18 back to the OR 4/19 encephalopathic. Back to the OR this morning-s/p exploratory laparotomy, ileostomy creation, small bowel resection, wound VAC in place 4/21 weaning pressors  4/23 extubated; off pressors 4/25 to TRH/ PCU 4/28 CVL removed  4/29 R fem HD cath removed   Interim History / Subjective:  Reconsult for hypotension after returning from IR- s/p R internal jugular CVL to restart TPN.  Pt also refused to have abd drain for new fluid collection noted on CT placed after CVL placement.  Heparin  gtt stopped this am  Refused lab draws today- sending now after  CVL placed  Pt c/o of severe pain, yelling out, mostly  behind R knee and all over.  Has been receiving dilaudid  1mg  q 2hr consistently  Currently BP improved with fluids, MAP now in low 70's  Objective   Blood pressure (!) 88/52, pulse (!) 147, temperature 98.3 F (36.8 C), temperature source Oral, resp. rate 17, height 5\' 9"  (1.753 m), weight 79.8 kg, SpO2 96%.        Intake/Output Summary (Last 24 hours) at 01/28/2024 1643 Last data filed at 01/28/2024 1555 Gross per 24 hour  Intake 2203.13 ml  Output 1500 ml  Net 703.13 ml   Filed Weights   01/26/24 0620 01/27/24 0644 01/28/24 0500  Weight: 78.7 kg 81 kg 79.8 kg    Examination: General:  AoC ill appearing adult male lying in bed yelling out HEENT: MM pink/dry, pupils 3/r Neuro: Yelling out at times but oriented x 3, MAE CV: rr, ST 120s PULM:  non labored, clear anteriorly, few intermittent left basilar rales, on RA GI: +bs, no guarding or obvious tenderness on exam, ostomy, midline dressing, left JP- no output, penile/ scrotal edema with foley - cyu Extremities: warm/dry, anasarca Skin: no rashes   No labs today Afebrile UOP 1.6L/ 24hrs  Ileostomy  1500 ml/24hrs  4/29 CT a/p>  1. Large, thick-walled fluid collection in the lower abdomen and pelvis, measuring 10.9 x 15.8 x 13.1 cm (APxTRxCC). Hyperdense material in this collection likely reflects a combination of ingested material and enteric contrast from the prior CT rather than or hemorrhage. Given these findings, this is worrisome for underlying small-bowel perforation. 2. Moderate wall thickening throughout the majority of the small bowel, likely reactive to the intra-abdominal inflammation. Additionally, gaseous distension of a couple of segments of small bowel in the upper abdomen, possibly reflect changes of a reactive ileus. 3. Increasing locules of gas subjacent to the midline ventral incision (axial 55-70), extending the peritoneum to the subcutaneous fat, worrisome for extraluminal gas with possible fistulous  communication. 4. Patchy ground-glass airspace opacities in the visualized lungs, which may represent underlying viral pneumonia or early pulmonary edema. Small bilateral pleural effusions, larger on the right than the left. 5. Diffuse anasarca.  Resolved Hospital Problem list   Shock, (hemorrhagic, septic)  In-hospital cardiac arrest 4/16 Acute hypoxic respiratory failure, extubated 4/23 Thrombocytopenia Hyponatremia Hypocalcemia Hypermagnesemia  Assessment & Plan:   Hypotension, resolving Sepsis 2/2 recurrent intra abdominal abscess concerning for SB leak Diverticulitis s/p perforation of ascending colon - s/p ex lap R colectomy intra-abd abscess drainage 4/16 - return OR 4/17, small bowel resection  - return OR 4/19 ileostomy creation - previously off abx since 4/24, with progressive leukocytosis  P:   - fluid responsive thus far, looks intravascularly dry with poor PO intake, ileostomy output/ increased UOP.  Hold on starting pressors.  Had lopressor  this am, since frequent dilaudid  and then versed  in IR which could have contributed.  Pain also likely contributing to tachycardia - MIVF while NPO for bowel rest - CCS following - pt states he would be willing to go to IR for drain tomorrow if felt necessary - rechecking CBC to r/o Hgb drop, lactic and CMET >> lactic reassuring at 1.6 - cont zosyn  per primary/ CCS, restarted this am - afebrile, trend WBC/ fever curve - follow cultures (central line tip 4/28) - remains NPO - TPN held 4/28.  Timing of restarting TPN per primary/ CCS - multimodal pain management- IV dilaudid , robaxin , add IV tylenol  if LFTs ok  ABLA IDA P: -recheck CBC  Hx of PE on warfarin -AC on hold since admission P: -speak with CCS and consider trialing heparin  IV  AKI > ATN Anasarca  - CRRT 4/19- 4/24, last iHD 4/27, HD cath d/c 4/29 am given improving renal indices and UOP P:  - cont foley - trending renal indices/ strict I/Os - per  Nephrology  Hx PE/ DVT - heparin  on hold given concern for possible hematoma vs abd abscess   Best Practice (right click and "Reselect all SmartList Selections" daily)   Diet/type: NPO; TPN on hold  DVT prophylaxis SCD Pressure ulcer(s): N/A GI prophylaxis: PPI Lines: Central line Foley:  Yes, and it is still needed Code Status:  full code Last date of multidisciplinary goals of care discussion - per primary team   Labs   CBC: Recent Labs  Lab 01/22/24 1033 01/23/24 0505 01/24/24 0552 01/26/24 0610 01/27/24 0045  WBC 15.8* 18.5* 23.9* 24.6* 26.1*  HGB 9.3* 9.0* 8.5* 7.9* 8.2*  HCT 30.5* 28.5* 27.5* 24.8* 25.4*  MCV 95.9 94.4 95.5 92.5 91.4  PLT 139* 227 405* 641* 667*    Basic Metabolic Panel: Recent Labs  Lab 01/23/24 0505 01/24/24 0552 01/25/24 0540 01/26/24 0610 01/27/24 0045  NA 134* 138 140 142 139  K 3.6 3.5 3.6 3.4* 3.3*  CL 102 103 106 106 103  CO2 24 25 22  20* 25  GLUCOSE 154* 144* 153* 152* 108*  BUN 32* 57* 81* 97* 55*  CREATININE 1.94* 3.89* 5.21* 6.11* 3.91*  CALCIUM  7.1* 7.4* 7.6* 7.7* 7.8*  MG 2.5* 2.3 2.2 2.1 1.7  PHOS 2.7 4.5 4.7* 4.5 2.3*   GFR: Estimated Creatinine Clearance: 19.8 mL/min (A) (by C-G formula based on SCr of 3.91 mg/dL (H)). Recent Labs  Lab 01/23/24 0505 01/24/24 0552 01/26/24 0610 01/27/24 0045  WBC 18.5* 23.9* 24.6* 26.1*    Liver Function Tests: Recent Labs  Lab 01/22/24 0417 01/22/24 1557 01/23/24 0505 01/24/24 0552 01/25/24 0540 01/26/24 0610 01/27/24 0045  AST 85*  --  57* 41  --   --   --   ALT 265*  --  179* 118*  --   --   --   ALKPHOS 78  --  69 63  --   --   --   BILITOT 2.2*  --  2.2* 1.8*  --   --   --   PROT 5.1*  --  5.4* 5.4*  --   --   --   ALBUMIN  1.7*  1.7*   < > 1.7* 1.5*  1.6* <1.5* <1.5* <1.5*   < > = values in this interval not displayed.   No results for input(s): "LIPASE", "AMYLASE" in the last 168 hours. No results for input(s): "AMMONIA" in the last 168 hours.  ABG     Component Value Date/Time   PHART 7.58 (H) 01/19/2024 0822   PCO2ART 30 (L) 01/19/2024 0822   PO2ART 64 (L) 01/19/2024 0822   HCO3 28.1 (H) 01/19/2024 0822   TCO2 31 01/18/2024 0927   ACIDBASEDEF 9.2 (H) 01/16/2024 1528   O2SAT 94.9 01/19/2024 0822     Coagulation Profile: Recent Labs  Lab 01/24/24 0552 01/26/24 0610 01/27/24 0045  INR 1.1 1.2 1.2    Cardiac Enzymes: No results for input(s): "CKTOTAL", "CKMB", "CKMBINDEX", "TROPONINI" in the last 168 hours.   HbA1C: Hgb A1c MFr Bld  Date/Time Value Ref Range Status  12/03/2023 02:37 PM 5.9 4.6 - 6.5 % Final    Comment:  Glycemic Control Guidelines for People with Diabetes:Non Diabetic:  <6%Goal of Therapy: <7%Additional Action Suggested:  >8%   01/22/2020 11:37 AM 5.2 4.8 - 5.6 % Final    Comment:             Prediabetes: 5.7 - 6.4          Diabetes: >6.4          Glycemic control for adults with diabetes: <7.0     CBG: Recent Labs  Lab 01/27/24 1116 01/27/24 1630 01/27/24 2224 01/28/24 0547 01/28/24 1144  GLUCAP 141* 89 92 89 84    CRITICAL CARE Performed by: Early Glisson   Total critical care time: 38  minutes   Early Glisson, MSN, AG-ACNP-BC [facility] 01/28/2024, 4:43 PM  See Amion for pager If no response to pager , please call [phone] until 7pm After 7:00 pm call Elink  [phone]

## 2024-01-28 NOTE — Consult Note (Addendum)
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 REDUCTION: This exam was performed according to the departmental dose-optimization program which includes automated exposure control, adjustment of the mA and/or kV according to patient size and/or use of iterative reconstruction technique. CONTRAST:  OMNIPAQUE  IOHEXOL  300 MG/ML  SOLN COMPARISON:  09/28/2019 FINDINGS: Lower chest: No pleural or pericardial effusion. Dependent atelectasis posteriorly in both lower lobes. Hepatobiliary: No focal liver abnormality is seen. No gallstones, gallbladder wall thickening, or biliary dilatation. Pancreas: Unremarkable. No pancreatic ductal dilatation or surrounding inflammatory changes. Spleen: Normal in size without focal abnormality. Adrenals/Urinary Tract: Adrenal glands are unremarkable. Kidneys are normal, without renal calculi, focal lesion, or hydronephrosis. Bladder is unremarkable. Stomach/Bowel: Stomach is decompressed, without acute finding. Scattered gas and fluid filled small bowel loops without dilatation. There is inflammatory process involving the proximal ascending colon including terminal ileum, with eccentric wall thickening. Multiple colonic diverticula in the ascending colon. No definite drainable abscess. More distal colon is partially  distended. Scattered diverticula in the distal descending segment without adjacent inflammatory change. Vascular/Lymphatic: Scattered aortoiliac calcified plaque without aneurysm. Portal vein patent. No retroperitoneal or pelvic adenopathy. Prominent subcentimeter lymph node in the right colon mesentery. No overt mesenteric adenopathy. Reproductive: Mild prostate enlargement. Other: No ascites.  No free air. Musculoskeletal: Spondylitic changes in the lower lumbar spine. 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 IMPRESSION: 1. Inflammatory process involving the proximal ascending colon including terminal ileum, with eccentric wall thickening. Differential considerations include diverticulitis, colitis, and neoplasm. Recommend follow-up colonoscopy when clinically appropriate. 2. 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 Electronically Signed   By: Nicoletta Barrier M.D.   On: 01/11/2024 17:15    Labs:  CBC: Recent Labs    01/23/24 0505 01/24/24 0552 01/26/24 0610 01/27/24 0045  WBC 18.5* 23.9* 24.6* 26.1*  HGB 9.0* 8.5* 7.9* 8.2*  HCT 28.5* 27.5* 24.8* 25.4*  PLT 227 405* 641* 667*    COAGS: Recent Labs    01/20/24 0427 01/21/24 0504 01/22/24 0417 01/23/24 0505 01/24/24 0552 01/26/24 0610 01/27/24 0045  INR 1.0 1.0  --   --  1.1 1.2 1.2  APTT 33 34  34 33 46*  --   --   --  BMP: Recent Labs    01/24/24 0552 01/25/24 0540 01/26/24 0610 01/27/24 0045  NA 138 140 142 139  K 3.5 3.6 3.4* 3.3*  CL 103 106 106 103  CO2 25 22 20* 25  GLUCOSE 144* 153* 152* 108*  BUN 57* 81* 97* 55*  CALCIUM  7.4* 7.6* 7.7* 7.8*  CREATININE 3.89* 5.21* 6.11* 3.91*  GFRNONAA 17* 12* 10* 17*    LIVER FUNCTION TESTS: Recent Labs    01/21/24 0504 01/21/24 1729 01/22/24 0417 01/22/24 1557 01/23/24 0505 01/24/24 0552 01/25/24 0540 01/26/24 0610 01/27/24 0045  BILITOT 1.9*  --  2.2*  --  2.2* 1.8*  --   --   --   AST 146*  --  85*  --  57* 41  --   --   --    ALT 415*  --  265*  --  179* 118*  --   --   --   ALKPHOS 83  --  78  --  69 63  --   --   --   PROT 5.1*  --  5.1*  --  5.4* 5.4*  --   --   --   ALBUMIN  1.7*  1.7*   < > 1.7*  1.7*   < > 1.7* 1.5*  1.6* <1.5* <1.5* <1.5*   < > = values in this interval not displayed.      Assessment and Plan:  Request for  image guided intra- abdominal fluid collection drain placement and CVC placement approved by Dr. Jinx Mourning for 4/29. No contraindications for procedure identified in ROS, physical exam, or review of pre-sedation considerations. Heparin  held since 1200 today. Labs reviewed and within acceptable range for procedure.  4/29 CT imaging available and reviewed by Dr. Jinx Mourning Afebrile, continues to be tachycardic in 110s.  Shared with [facility] PA plan to complete drain and CVC today.  Spoke to son Ladanian Guzowski) via phone for consent : Risks and benefits discussed with the patient's son including bleeding, infection, damage to adjacent structures, bowel perforation/fistula connection, and sepsis. He understands that the patient will most likely be discharged with drain in place. Risks and benefits of image guided CVC placement was discussed with the patient's son including, but not limited to bleeding, infection, pneumothorax, or fibrin sheath development and need for additional procedures.  All of the patient's questions were answered, patient is agreeable to proceed. Consent signed and in chart.   All of the patient's questions were answered, patient is agreeable to proceed. Consent signed and in chart.     Thank you for allowing our service to participate in Alexander [name] 's care.    Electronically Signed: Terressa Fess, NP   01/28/2024, 12:46 PM     I spent a total of 40 Minutes    in face to face in clinical consultation, greater than 50% of which was counseling/coordinating care for image guided intraabdominal fluid collection drain placement and image guided central venous  catheter placement.    (A copy of this note was sent to the referring provider and the time of visit.)

## 2024-01-28 NOTE — Progress Notes (Signed)
 4/28 @ 2114: Pt refused blood draws for culture  4/29 @ 0330: Pt refused blood draws again for culture. Pt also refused abdominal dressing changes. Pt educated that blood draws have a direct impact on his plan of care. Pt educated on risk of infection.  Sonjia Durie, RN

## 2024-01-28 NOTE — Progress Notes (Addendum)
 10 Days Post-Op  Subjective: C/o abdominal pain.  More lethargic today and WBC up to 26K yesterday, none today, tachy in 130s.  Didn't eat much yesterday.  Tmax 100.7  Objective: Vital signs in last 24 hours: Temp:  [97.5 F (36.4 C)-100.7 F (38.2 C)] 98.3 F (36.8 C) (04/29 0717) Pulse Rate:  [117-130] 117 (04/29 0331) Resp:  [15-24] 15 (04/29 0717) BP: (110-151)/(68-86) 110/68 (04/29 0717) SpO2:  [93 %-96 %] 94 % (04/29 0717) Weight:  [79.8 kg] 79.8 kg (04/29 0500) Last BM Date : 01/27/24  Intake/Output from previous day: 04/28 0701 - 04/29 0700 In: 1841.7 [P.O.:720; I.V.:1121.7] Out: 3180 [Urine:1650; Drains:30; Stool:1500] Intake/Output this shift: No intake/output data recorded.  PE: Gen: lethargic, occasionally moaning Abd: Soft, mild tenderness diffusely. Ileostomy viable with thicker dark output in bag. Midline wound as noted below - mostly granulation tissues, some areas w/ fibrinous tissue at the base, no drainage. JP in place, 30cc of output - ss  Lab Results:  Recent Labs    01/26/24 0610 01/27/24 0045  WBC 24.6* 26.1*  HGB 7.9* 8.2*  HCT 24.8* 25.4*  PLT 641* 667*   BMET Recent Labs    01/26/24 0610 01/27/24 0045  NA 142 139  K 3.4* 3.3*  CL 106 103  CO2 20* 25  GLUCOSE 152* 108*  BUN 97* 55*  CREATININE 6.11* 3.91*  CALCIUM  7.7* 7.8*   PT/INR Recent Labs    01/26/24 0610 01/27/24 0045  LABPROT 15.6* 15.2  INR 1.2 1.2   CMP     Component Value Date/Time   NA 139 01/27/2024 0045   NA 141 01/22/2020 1137   K 3.3 (L) 01/27/2024 0045   CL 103 01/27/2024 0045   CO2 25 01/27/2024 0045   GLUCOSE 108 (H) 01/27/2024 0045   BUN 55 (H) 01/27/2024 0045   BUN 13 01/22/2020 1137   CREATININE 3.91 (H) 01/27/2024 0045   CREATININE 0.98 06/14/2017 0935   CALCIUM  7.8 (L) 01/27/2024 0045   PROT 5.4 (L) 01/24/2024 0552   PROT 7.3 01/22/2020 1137   ALBUMIN  <1.5 (L) 01/27/2024 0045   ALBUMIN  4.2 01/22/2020 1137   AST 41 01/24/2024 0552    ALT 118 (H) 01/24/2024 0552   ALKPHOS 63 01/24/2024 0552   BILITOT 1.8 (H) 01/24/2024 0552   BILITOT 0.4 01/22/2020 1137   GFRNONAA 17 (L) 01/27/2024 0045   GFRNONAA >89 09/17/2014 1253   GFRAA 111 01/22/2020 1137   GFRAA >89 09/17/2014 1253   Lipase     Component Value Date/Time   LIPASE 22 09/28/2019 1109       Studies/Results: No results found.   Anti-infectives: Anti-infectives (From admission, onward)    Start     Dose/Rate Route Frequency Ordered Stop   01/18/24 1400  piperacillin -tazobactam (ZOSYN ) IVPB 3.375 g  Status:  Discontinued        3.375 g 12.5 mL/hr over 240 Minutes Intravenous Every 6 hours 01/18/24 1241 01/18/24 1242   01/18/24 1400  piperacillin -tazobactam (ZOSYN ) IVPB 3.375 g        3.375 g 100 mL/hr over 30 Minutes Intravenous Every 6 hours 01/18/24 1242 01/23/24 2046   01/18/24 0000  piperacillin -tazobactam (ZOSYN ) IVPB 2.25 g  Status:  Discontinued        2.25 g 100 mL/hr over 30 Minutes Intravenous Every 6 hours 01/17/24 2120 01/18/24 1241   01/12/24 0200  piperacillin -tazobactam (ZOSYN ) IVPB 3.375 g  Status:  Discontinued        3.375 g 12.5  mL/hr over 240 Minutes Intravenous Every 8 hours 01/11/24 2120 01/17/24 2120   01/11/24 1815  piperacillin -tazobactam (ZOSYN ) IVPB 3.375 g        3.375 g 100 mL/hr over 30 Minutes Intravenous  Once 01/11/24 1806 01/11/24 1921        Assessment/Plan LAPAROTOMY, EXPLORATORY RIGHT COLECTOMY (without anastomosis) PLACEMENT OF ABTHERA WOUND VAC DRAINAGE OF INTRA-ABDOMINAL ABSCESS  4/16 EW   RE-EXPLORATION ABDOMEN, PLACEMENT OF ABTHERA WOUND VAC, SMALL BOWEL RESECTION 01/16/2024 EW   Re-exploration abdomen, small bowel resection, creation of end ileostomy, abd closure, placement wound vac 4/19 EW  - TNA held due to one of his lines being removed - NPO for CT scan and possible IR intervention - Cont to monitor ostomy output - Completed post op abx, but WBC increasing, tachy, lethargic today, with  increasing temp.  Will restart zosyn  today - patient does not look great today.  Will rescan his abdomen.  Cath tip culture is in process.  He refused blood cultures yesterday.  Still has a right femoral line in which would be good to remove, if not going to be used.  If CT scan doesn't show anything except this fluid collection around his drain still, will consult IR to see if they can exchange our surgical drain for better positioning and to see if it will work better to evacuate this collection.  CT to rule out any other etiologies.  FEN - NPO for scan and possible need for procedure VTE - SCDs, heparin  gtt  ID - Completed post op abx. WBC 26.1 yesterday.  Restart abx today given clinical status of patient.  - Per TRH -  Pleural effusions Septic shock - resolved ABL anemia - monitor and transfuse as needed (14 u prbc total, 1u cryo, 6u FFP, 1 plt) Coagulopathy - resolved AKI - started CRRT 4/19,  defer to renal.  Holding HD as UOP is good and Cr improving Acute hypoxemic resp failure - extubated H/o VTE/PE/DVT - scds for now; was on coumadin    ADDENDUM: CT scan reviewed personally and with radiology.  Appears to have hyperdense fluid accumulating in his pelvis.  He received oral contrast on Friday for his CT scan.  It is likely he has some hole in his small bowel that is leaking.  He is 2 weeks post op from multiple abdominal operations and will be very  high risk to return to the OR with risk for EC fistula, more perforations, further leak from this site despite repair secondary to poor nutrition etc.  We will try to treat this conservatively with a drain and controlled fistula.  He will need bowel rest and his NGT replaced.  Continue TNA for nutrition.  This was discussed with the patient and the primary service.   LOS: 17 days    Alexander [name] , [facility] 01/28/2024, 8:27 AM Please see Amion for pager number during day hours 7:00am-4:30pm or 7:00am -11:30am on  weekends

## 2024-01-28 NOTE — Progress Notes (Signed)
 Occupational Therapy Treatment Patient Details Name: Alexander [name] MRN: [id number] DOB: [birth year] Today's Date: 01/28/2024   History of present illness Alexander [name] is a 63 y.o. male who .  He was admitted to [facility] on 01/11/2024 with rectal bleeding and hypotension.S/P exploratory laparotomy where he was found to have a perforated ascending colon with intra-abdominal abscess, right colectomy without anastomosis, placement of wound VAC, and drainage of intra-abdominal abscesses. CRRT started 4/19- 01/23/24 .   PMH: PE/DVT on Coumadin , GERD, R ankle arthroplasty   OT comments  Patient received in supine and agreeable to OT session and to address self feeding. Patient placed in chair position and attempted to have patient hold utensil with patient stating "I can't do that". Patient was fed two bites of jello to progress to feeding self and when attempting a third bite patient state "I told you I didn't want anymore". Face washing attempted with patient unable to hold washcloth or bring to face. Attempted UB AAROM with patient resistant to attempt. Patient moaning and occasionally cursing out load due to pain and when asked what hurt patient states, "everything". Acute OT to continue to follow to address established goals to facilitate DC to next venue of care.       If plan is discharge home, recommend the following:  Two people to help with walking and/or transfers;Two people to help with bathing/dressing/bathroom;Direct supervision/assist for medications management;Assistance with feeding;Help with stairs or ramp for entrance   Equipment Recommendations  Other (comment) (to be determined pending functional progress)    Recommendations for Other Services      Precautions / Restrictions Precautions Precautions: Fall Recall of Precautions/Restrictions: Impaired Precaution/Restrictions Comments: Left radial art line, Right central line, R femoral HD catheter Restrictions Weight Bearing Restrictions  Per Provider Order: No Other Position/Activity Restrictions: urinary catheter, L radial arterial line, R femoral hemo-dialysis catheter       Mobility Bed Mobility Overal bed mobility: Needs Assistance             General bed mobility comments: assisted in chair position    Transfers                   General transfer comment: pt declining     Balance                                           ADL either performed or assessed with clinical judgement   ADL Overall ADL's : Needs assistance/impaired Eating/Feeding: Total assistance;Bed level Eating/Feeding Details (indicate cue type and reason): patient states unable to hold spoon or bring to mouth Grooming: Total assistance;Bed level Grooming Details (indicate cue type and reason): unable to bring washcloth to face                               General ADL Comments: attempted self feeding with patient unable to hold utensil or bring to mouth, after taking 2 bites of jello, angerily states, "I told you I don't want any more"    Extremity/Trunk Assessment Upper Extremity Assessment Upper Extremity Assessment: Generalized weakness;Right hand dominant            Vision       Perception     Praxis     Communication Communication Communication: Impaired Factors Affecting Communication: Reduced clarity of speech   Cognition Arousal:  Alert Behavior During Therapy: Flat affect, Agitated               OT - Cognition Comments: cursing but states he was talking to self when asked what was wrong                 Following commands: Impaired Following commands impaired: Follows one step commands inconsistently      Cueing   Cueing Techniques: Verbal cues, Tactile cues, Visual cues  Exercises      Shoulder Instructions       General Comments VSS on RA    Pertinent Vitals/ Pain       Pain Assessment Pain Assessment: Faces Faces Pain Scale: Hurts even  more Pain Location: generalized Pain Descriptors / Indicators: Grimacing, Moaning, Discomfort Pain Intervention(s): Limited activity within patient's tolerance, Monitored during session, Repositioned  Home Living                                          Prior Functioning/Environment              Frequency  Min 2X/week        Progress Toward Goals  OT Goals(current goals can now be found in the care plan section)  Progress towards OT goals: Not progressing toward goals - comment (limited by weakness and agitation)  Acute Rehab OT Goals Patient Stated Goal: none stated OT Goal Formulation: Patient unable to participate in goal setting Time For Goal Achievement: 02/06/24 Potential to Achieve Goals: Fair ADL Goals Pt Will Perform Eating: with min assist;with adaptive utensils;bed level Pt Will Perform Grooming: with min assist;bed level Pt Will Perform Upper Body Dressing: with min assist;bed level Additional ADL Goal #1: The pt will perform bed mobility with min assist, in prep for progressive ADL participation.  Plan      Co-evaluation                 AM-PAC OT "6 Clicks" Daily Activity     Outcome Measure   Help from another person eating meals?: Total Help from another person taking care of personal grooming?: Total Help from another person toileting, which includes using toliet, bedpan, or urinal?: Total Help from another person bathing (including washing, rinsing, drying)?: Total Help from another person to put on and taking off regular upper body clothing?: Total Help from another person to put on and taking off regular lower body clothing?: Total 6 Click Score: 6    End of Session    OT Visit Diagnosis: Muscle weakness (generalized) (M62.81);Feeding difficulties (R63.3);Pain Pain - part of body:  (generalized, "all over")   Activity Tolerance Patient limited by pain;Treatment limited secondary to agitation   Patient Left in  bed;with call bell/phone within reach;with chair alarm set;with nursing/sitter in room   Nurse Communication Mobility status        Time: 1610-9604 OT Time Calculation (min): 15 min  Charges: OT General Charges $OT Visit: 1 Visit OT Treatments $Self Care/Home Management : 8-22 mins  Anitra Barn, OTA Acute Rehabilitation Services  Office [phone]   Jovita Nipper 01/28/2024, 3:19 PM

## 2024-01-28 NOTE — Progress Notes (Signed)
   Critical HgB of 6.2 g/dl. Repeat verified. Drawn from CVL.  Discussed with PCCM. Will transfuse with 2 units PRBC. Stopping IV heparin  gtts. Heparin  gtts had been held since around 8-9 AM today for potential abd drain placement.  Discussed with PCCM. Not going to restart TPN for now. Need to wait until hemodynamics are more stable.  Unk Garb, DO [facility]

## 2024-01-28 NOTE — Progress Notes (Signed)
   PCCM consulted due to hypotension. IV fluid bolus ordered. I attempted to order IV levophed  but was told by RN that IV levophed  cannot be started on 2C.  Unk Garb, DO [facility]

## 2024-01-28 NOTE — Progress Notes (Signed)
 Interventional Radiology Progress Note  The patient withdrew consent for drain placement after the CVC.   Cannot proceed today for drainage.

## 2024-01-28 NOTE — Significant Event (Signed)
 Rapid Response Event Note   Reason for Call :  Hypotension  Initial Focused Assessment:  Patient is alert and oriented.  He is screaming out, he states he has "pain all over".  When asked to rate his pain he states it is a 10.  He is does not specify a particular area of pain.  He states his abdomen feels about the same as it did yesterday. Lung sounds clear, decreased bases Heart tones regular.  BP 94/67  HR 131  RR 15-22  O2 sat 96% on RA   temp 97.7 Oral   Interventions:  1L ns bolus BP improved to 118/72  HR 130  CCM consult:  Brooke NP at bedside to assess patient  Labs drawn via central line CBG 46/52 finger stick,  CBG 77 via central line blood draw   Plan of Care:  Await lab results RN to call if patient become hypotensive or has a change in level of consciousness.   Event Summary:   MD Notified: Dr Farrel Hones prior to my arrival Call Time: 1607 Arrival Time: 1610 End Time:  1715  Waldemar Guillaume, RN

## 2024-01-28 NOTE — Progress Notes (Signed)
 Patient refused to have Gastric tube placed. Patient yelled "NO", when told Gastric tube needed to be placed.

## 2024-01-28 NOTE — Progress Notes (Addendum)
 Nutrition Follow-up  DOCUMENTATION CODES:   Not applicable  INTERVENTION:  Plan to re-initiate TPN:             - TPN management per pharmacy.  Daily weights while on TPN Discontinue Ensure Enlive as patient NPO and refusing, re-initiate when diet able to be advanced and better tolerated   NUTRITION DIAGNOSIS:  Inadequate oral intake related to inability to eat as evidenced by NPO status. - remains applicable  GOAL:  Patient will meet greater than or equal to 90% of their needs - to be met via parenteral nutrition  MONITOR:  Vent status, Labs, Weight trends  REASON FOR ASSESSMENT:  Consult New TPN/TNA  ASSESSMENT:  63 y.o. male who has a PMH including HTN, PAD, diverticulitis who was admitted with rectal bleeding and hypotension.  4/12 admitted 4/13 CLD 4/15 CT a/p showing suspicion for contained colon perforation and ileus 4/16 OR for ex-lap, right colectomy, wound vac placement; returned to ICU intubated 4/17 OR for re-exploration of abdomen, wound vac placement, small bowel resection, left in discontinuity  4/19 s/p ex lap, ileostomy creation, SB resection, Wound VAC; CRRT initiated 4/21 TPN Initiated  4/23 Extubated, NGT placed for suction 4/24 Stopping CRRT 4/25 transferred to [facility] for HD txs 4/27 clear liquid diet 4/28 NGT removed, full liquid diet, TPN stopped 4/29 NPO, replacement RIJ CVL, withdrew consent for drain placement   Patient with some decline in last day. More lethargic and altered today with persistent leukocytosis. Refusing abdominal drain placement, blood cultures, and lab work. Concern for infection at one of his line sites. RIJ CV and femoral HD removed. CT also ordered and patient found to have intraabdominal fluid collection that appears to contain enteric contrast and other ingested material. Pt off the floor for procedure at time of assessment, where he refused placement/adjustment of abdominal drain.   Average Meal Intake No documentation to  review  Pt has been on TPN since 4/19, which was discontinued on 4/27 and clear liquid diet initiated and advanced to full liquid on 4/28. Pt has eaten very little of PO diet. NPO again today.   Admit Weight: 80.7kg Current Weight: 79.8 Lowest Weight: 77.5kg on 4/25  Initially had quite a large weight increased likely 2/2 amount of blood products/fluids received. Has stabilized around admission weight, however moderate pitting edema documented to BLEs and mild edema to BUEs. Likely has more weight to lose that may be masking additional muscle and fat wasting.  Drain/Lines: RIJ (CVC triple lumen): RUQ: Ileostomy: 1.5L x24 hours LLQ: JP drain (19Fr): 40ml x24 hours Foley catheter UOP: 1.6L x24 hours  Potassium and phosphorus repletion have been ordered. Albumin  significantly low. This is reflective of the inflammatory processes at play as well as acute stress response and some fluid overload.  Meds: SSI 0-15 q6, pantoprazole , IV ABX  Labs from 4/28 reviewed:  Na+ 139 (wdl) K+ 3.6>3.4>3.3 (L) PHOS 4.7>4.5>2.3 (L) Mg 1.7 (wdl) WBC 23.9>24.6>26.1 (H) Alb <1.5 (L) CBGs 108-152 x24 hours A1c 5.9 (11/2023)  Diet Order:   Diet Order             Diet NPO time specified Except for: Sips with Meds  Diet effective now               EDUCATION NEEDS:   Education needs have been addressed  Skin:  Skin Assessment: Skin Integrity Issues: Skin Integrity Issues:: Incisions, Other (Comment) Incisions: abdomen Other: Several skin tears  Last BM:  4/29 x1  Height:  Ht  Readings from Last 1 Encounters:  01/24/24 5\' 9"  (1.753 m)   Weight:  Wt Readings from Last 1 Encounters:  01/28/24 79.8 kg   Ideal Body Weight:  72.73 kg  BMI:  Body mass index is 25.98 kg/m.  Estimated Nutritional Needs:   Kcal:  2000-2250 kcals  Protein:  100-115 grams  Fluid:  >/= 2L  Con Decant MS, RD, LDN Registered Dietitian Clinical Nutrition RD Inpatient Contact Info in Amion

## 2024-01-28 NOTE — Progress Notes (Signed)
 At 1555 patient B/P 84/51 MAP 62. Dr. Farrel Hones notified. See new orders.

## 2024-01-29 ENCOUNTER — Inpatient Hospital Stay (HOSPITAL_COMMUNITY)

## 2024-01-29 DIAGNOSIS — K631 Perforation of intestine (nontraumatic): Secondary | ICD-10-CM | POA: Diagnosis not present

## 2024-01-29 HISTORY — PX: IR FLUORO GUIDE CV LINE RIGHT: IMG2283

## 2024-01-29 HISTORY — PX: IR US GUIDE VASC ACCESS RIGHT: IMG2390

## 2024-01-29 LAB — BASIC METABOLIC PANEL WITH GFR
Anion gap: 15 (ref 5–15)
BUN: 87 mg/dL — ABNORMAL HIGH (ref 8–23)
CO2: 20 mmol/L — ABNORMAL LOW (ref 22–32)
Calcium: 7.8 mg/dL — ABNORMAL LOW (ref 8.9–10.3)
Chloride: 105 mmol/L (ref 98–111)
Creatinine, Ser: 6.01 mg/dL — ABNORMAL HIGH (ref 0.61–1.24)
GFR, Estimated: 10 mL/min — ABNORMAL LOW (ref >60)
Glucose, Bld: 90 mg/dL (ref 70–99)
Potassium: 5.3 mmol/L — ABNORMAL HIGH (ref 3.5–5.1)
Sodium: 140 mmol/L (ref 135–145)

## 2024-01-29 LAB — GLUCOSE, CAPILLARY
Glucose-Capillary: 130 mg/dL — ABNORMAL HIGH (ref 70–99)
Glucose-Capillary: 140 mg/dL — ABNORMAL HIGH (ref 70–99)
Glucose-Capillary: 68 mg/dL — ABNORMAL LOW (ref 70–99)
Glucose-Capillary: 73 mg/dL (ref 70–99)
Glucose-Capillary: 81 mg/dL (ref 70–99)
Glucose-Capillary: 96 mg/dL (ref 70–99)

## 2024-01-29 LAB — CBC
HCT: 21.9 % — ABNORMAL LOW (ref 39.0–52.0)
HCT: 20.4 % — ABNORMAL LOW (ref 39.0–52.0)
Hemoglobin: 7.3 g/dL — ABNORMAL LOW (ref 13.0–17.0)
Hemoglobin: 6.6 g/dL — CL (ref 13.0–17.0)
MCH: 29.8 pg (ref 26.0–34.0)
MCH: 29.5 pg (ref 26.0–34.0)
MCHC: 33.3 g/dL (ref 30.0–36.0)
MCHC: 32.4 g/dL (ref 30.0–36.0)
MCV: 89.4 fL (ref 80.0–100.0)
MCV: 91.1 fL (ref 80.0–100.0)
Platelets: 501 10*3/uL — ABNORMAL HIGH (ref 150–400)
Platelets: 406 10*3/uL — ABNORMAL HIGH (ref 150–400)
RBC: 2.45 MIL/uL — ABNORMAL LOW (ref 4.22–5.81)
RBC: 2.24 MIL/uL — ABNORMAL LOW (ref 4.22–5.81)
RDW: 18.9 % — ABNORMAL HIGH (ref 11.5–15.5)
RDW: 18.7 % — ABNORMAL HIGH (ref 11.5–15.5)
WBC: 19.7 10*3/uL — ABNORMAL HIGH (ref 4.0–10.5)
WBC: 15.3 10*3/uL — ABNORMAL HIGH (ref 4.0–10.5)
nRBC: 0 % (ref 0.0–0.2)
nRBC: 0 % (ref 0.0–0.2)

## 2024-01-29 LAB — CBC WITH DIFFERENTIAL/PLATELET
Abs Immature Granulocytes: 0.22 10*3/uL — ABNORMAL HIGH (ref 0.00–0.07)
Basophils Absolute: 0.1 10*3/uL (ref 0.0–0.1)
Basophils Relative: 0 %
Eosinophils Absolute: 0.3 10*3/uL (ref 0.0–0.5)
Eosinophils Relative: 1 %
HCT: 22.9 % — ABNORMAL LOW (ref 39.0–52.0)
Hemoglobin: 7.5 g/dL — ABNORMAL LOW (ref 13.0–17.0)
Immature Granulocytes: 1 %
Lymphocytes Relative: 5 %
Lymphs Abs: 1.3 10*3/uL (ref 0.7–4.0)
MCH: 30 pg (ref 26.0–34.0)
MCHC: 32.8 g/dL (ref 30.0–36.0)
MCV: 91.6 fL (ref 80.0–100.0)
Monocytes Absolute: 1.9 10*3/uL — ABNORMAL HIGH (ref 0.1–1.0)
Monocytes Relative: 8 %
Neutro Abs: 20.5 10*3/uL — ABNORMAL HIGH (ref 1.7–7.7)
Neutrophils Relative %: 85 %
Platelets: 491 10*3/uL — ABNORMAL HIGH (ref 150–400)
RBC: 2.5 MIL/uL — ABNORMAL LOW (ref 4.22–5.81)
RDW: 18.5 % — ABNORMAL HIGH (ref 11.5–15.5)
WBC: 24.3 10*3/uL — ABNORMAL HIGH (ref 4.0–10.5)
nRBC: 0 % (ref 0.0–0.2)

## 2024-01-29 LAB — TROPONIN I (HIGH SENSITIVITY)
Troponin I (High Sensitivity): 10 ng/L (ref <18)
Troponin I (High Sensitivity): 10 ng/L (ref <18)

## 2024-01-29 LAB — PREPARE RBC (CROSSMATCH)

## 2024-01-29 LAB — PHOSPHORUS: Phosphorus: 8.5 mg/dL — ABNORMAL HIGH (ref 2.5–4.6)

## 2024-01-29 MED ORDER — HEPARIN SODIUM (PORCINE) 1000 UNIT/ML IJ SOLN
INTRAMUSCULAR | Status: AC
  Filled 2024-01-29: qty 10

## 2024-01-29 MED ORDER — TRACE MINERALS CU-MN-SE-ZN 300-55-60-3000 MCG/ML IV SOLN
INTRAVENOUS | Status: AC
  Filled 2024-01-29: qty 761.6

## 2024-01-29 MED ORDER — MIDAZOLAM HCL 2 MG/2ML IJ SOLN
INTRAMUSCULAR | Status: AC
Start: 2024-01-29 — End: ?
  Filled 2024-01-29: qty 4

## 2024-01-29 MED ORDER — HYDROMORPHONE HCL 1 MG/ML IJ SOLN
0.5000 mg | INTRAMUSCULAR | Status: DC | PRN
  Administered 2024-01-30 – 2024-01-31 (×3): 0.5 mg via INTRAVENOUS
  Filled 2024-01-29 (×4): qty 0.5

## 2024-01-29 MED ORDER — SODIUM CHLORIDE 0.9 % IV SOLN: INTRAVENOUS | Status: DC

## 2024-01-29 MED ORDER — SODIUM CHLORIDE 0.9% IV SOLUTION: Freq: Once | INTRAVENOUS | Status: AC

## 2024-01-29 MED ORDER — FENTANYL CITRATE (PF) 100 MCG/2ML IJ SOLN
INTRAMUSCULAR | Status: AC | PRN
  Administered 2024-01-29 (×3): 25 ug via INTRAVENOUS

## 2024-01-29 MED ORDER — FENTANYL CITRATE (PF) 100 MCG/2ML IJ SOLN
INTRAMUSCULAR | Status: AC
  Filled 2024-01-29: qty 4

## 2024-01-29 MED ORDER — SODIUM CHLORIDE 0.9% FLUSH
5.0000 mL | Freq: Three times a day (TID) | INTRAVENOUS | Status: DC
  Administered 2024-01-29 – 2024-02-10 (×31): 5 mL

## 2024-01-29 MED ORDER — DEXTROSE 10 % IV SOLN: INTRAVENOUS | Status: AC

## 2024-01-29 MED ORDER — MIDAZOLAM HCL 2 MG/2ML IJ SOLN
INTRAMUSCULAR | Status: AC | PRN
  Administered 2024-01-29: 1 mg via INTRAVENOUS
  Administered 2024-01-29 (×2): .5 mg via INTRAVENOUS

## 2024-01-29 MED ORDER — HYDROMORPHONE HCL 1 MG/ML IJ SOLN
1.0000 mg | INTRAMUSCULAR | Status: DC | PRN
  Administered 2024-01-30 – 2024-02-07 (×26): 1 mg via INTRAVENOUS
  Filled 2024-01-29 (×26): qty 1

## 2024-01-29 MED ORDER — LIDOCAINE-EPINEPHRINE 1 %-1:100000 IJ SOLN
INTRAMUSCULAR | Status: AC
  Filled 2024-01-29: qty 1

## 2024-01-29 MED ORDER — ALBUMIN HUMAN 5 % IV SOLN
12.5000 g | Freq: Once | INTRAVENOUS | Status: AC
  Administered 2024-01-29: 12.5 g via INTRAVENOUS
  Filled 2024-01-29: qty 250

## 2024-01-29 MED ORDER — PANTOPRAZOLE SODIUM 40 MG IV SOLR
40.0000 mg | Freq: Two times a day (BID) | INTRAVENOUS | Status: DC
  Administered 2024-01-30 – 2024-02-02 (×8): 40 mg via INTRAVENOUS
  Filled 2024-01-29 (×8): qty 10

## 2024-01-29 MED ORDER — CHLORHEXIDINE GLUCONATE CLOTH 2 % EX PADS: 6.0000 | MEDICATED_PAD | Freq: Every day | CUTANEOUS | Status: DC

## 2024-01-29 MED ORDER — DICLOFENAC SODIUM 1 % EX GEL
2.0000 g | Freq: Four times a day (QID) | CUTANEOUS | Status: DC
  Administered 2024-01-29 – 2024-02-10 (×38): 2 g via TOPICAL
  Filled 2024-01-29 (×2): qty 100

## 2024-01-29 MED ORDER — INSULIN ASPART 100 UNIT/ML IJ SOLN
0.0000 [IU] | INTRAMUSCULAR | Status: DC
  Administered 2024-01-29: 1 [IU] via SUBCUTANEOUS
  Administered 2024-01-30: 2 [IU] via SUBCUTANEOUS
  Administered 2024-01-30: 1 [IU] via SUBCUTANEOUS

## 2024-01-29 NOTE — Plan of Care (Signed)
  Problem: Education: Goal: Knowledge of General Education information will improve Description: Including pain rating scale, medication(s)/side effects and non-pharmacologic comfort measures Outcome: Progressing   Problem: Health Behavior/Discharge Planning: Goal: Ability to manage health-related needs will improve Outcome: Progressing   Problem: Clinical Measurements: Goal: Ability to maintain clinical measurements within normal limits will improve Outcome: Progressing Goal: Will remain free from infection Outcome: Progressing Goal: Diagnostic test results will improve Outcome: Progressing Goal: Respiratory complications will improve Outcome: Progressing Goal: Cardiovascular complication will be avoided Outcome: Progressing   Problem: Nutrition: Goal: Adequate nutrition will be maintained Outcome: Progressing   Problem: Coping: Goal: Level of anxiety will decrease Outcome: Progressing   Problem: Elimination: Goal: Will not experience complications related to bowel motility Outcome: Progressing Goal: Will not experience complications related to urinary retention Outcome: Progressing   Problem: Pain Managment: Goal: General experience of comfort will improve and/or be controlled Outcome: Progressing   Problem: Safety: Goal: Ability to remain free from injury will improve Outcome: Progressing   Problem: Skin Integrity: Goal: Risk for impaired skin integrity will decrease Outcome: Progressing   Problem: Activity: Goal: Ability to tolerate increased activity will improve Outcome: Progressing   Problem: Respiratory: Goal: Ability to maintain a clear airway and adequate ventilation will improve Outcome: Progressing   Problem: Role Relationship: Goal: Method of communication will improve Outcome: Progressing   Problem: Education: Goal: Ability to describe self-care measures that may prevent or decrease complications (Diabetes Survival Skills Education) will  improve Outcome: Progressing Goal: Individualized Educational Video(s) Outcome: Progressing   Problem: Coping: Goal: Ability to adjust to condition or change in health will improve Outcome: Progressing   Problem: Fluid Volume: Goal: Ability to maintain a balanced intake and output will improve Outcome: Progressing   Problem: Health Behavior/Discharge Planning: Goal: Ability to identify and utilize available resources and services will improve Outcome: Progressing Goal: Ability to manage health-related needs will improve Outcome: Progressing   Problem: Metabolic: Goal: Ability to maintain appropriate glucose levels will improve Outcome: Progressing   Problem: Nutritional: Goal: Maintenance of adequate nutrition will improve Outcome: Progressing Goal: Progress toward achieving an optimal weight will improve Outcome: Progressing   Problem: Skin Integrity: Goal: Risk for impaired skin integrity will decrease Outcome: Progressing   Problem: Tissue Perfusion: Goal: Adequacy of tissue perfusion will improve Outcome: Progressing   Problem: Safety: Goal: Non-violent Restraint(s) Outcome: Progressing

## 2024-01-29 NOTE — Procedures (Signed)
 Interventional Radiology Procedure Note  Procedure: CT guided abdominal drain placement  Findings: Please refer to procedural dictation for full description. 10 Fr right midline anterior abdominal pigtail drain, to bulb suction.  Dark bloody output.  Sample sent for culture.  Complications: None immediate  Estimated Blood Loss: < 5 mL  Recommendations: Keep to bulb suction for now. Follow culture. IR will follow.   Creasie Doctor, MD

## 2024-01-29 NOTE — Progress Notes (Signed)
 PHARMACY - TOTAL PARENTERAL NUTRITION CONSULT NOTE   Indication:  intolerance to enteral nutrition  Patient Measurements: Height: 5\' 9"  (175.3 cm) Weight: 79.8 kg (175 lb 14.8 oz) IBW/kg (Calculated) : 70.7 TPN AdjBW (KG): 78.1 Body mass index is 25.98 kg/m.  Assessment: Alexander [name] admitted 4/12 for rectal bleeding and weakness s/p multiple GI surgeries with findings of colonic perforation requiring resection of R colon, closure on 4/19. Pt with high OG output post-operatively with no meal intake since admission. Prior to admission, patient reports intake of "normal food". Pharmacy to start TPN for prolonged duration without enteral nutrition and prolonged ileus.   TPN discontinued 4/28 with full liquid diet and displaced central line, however resuming 4/30 with new line due to need for bowel rest and findings on 4/29 CT indicating patient is high risk for return to OR for EC fistula or additional perforations.  Glucose / Insulin : no hx DM, CBG <100 while off TPN Required ~6u/24h SSI while TPN was running for CBG 130-140 Electrolytes: K 5.3, Mg 2.0 (4/29 PM, none in previous TPNs), Phos 8.5, Cl 105, CO2 20 Required aggressive Ca replacement earlier in admission Renal: SCr 6.01, BUN 87 Nephrology following, likely plan for HD on 5/1. CRRT 4/19 >> 4/24. Last HD 4/27 PM with 1L removed.  Hepatic: LFTs normalized, albumin  <1.5, Tbili elevated 1.5 but trending down, TG 63 on 4/28 Intake / Output; MIVF: No UOP documented 4/29, stool output 420 mL, drain output 20 mL 4/27 successful NGT clamping & removed 4/28  GI Imaging: 4/15 CT A/P: wall thickening of R colon with inflammation and suspected enlarging contained perforation 2/2 diverticulitis, colitis or neoplasm. No organized fluid collection or pneumoperitoneum. Interval increased gastric distension consistent with an ileus. 4/25 CT A/P: 5.9 cm fluid collection inferior to the gallbladder, possibly hepatic abscess 4/29 CT: extravasated oral  contrast vs hematoma GI Surgeries / Procedures:  4/19 Re-exploration abdomen, small bowel resection, creation of end ileostomy, abd closure and placement of wound vac 4/17 Re-exploration abdomen, small bowel resection 4/16 Right colectomy, drainage of abscess  Central access: CVC 4/29 TPN start date: 4/21-4/28; 4/30  Nutritional Goals: Goal rate of 70 mL/hr will provide 114 g protein and 2013 kcal  RD Assessment: (recalculated 4/24 after CRRT stopped)  Estimated Needs Total Energy Estimated Needs: 2000-2250 kcals Total Protein Estimated Needs: 100-115 grams Total Fluid Estimated Needs: >/= 2L  Current Nutrition:  NPO and TPN  Plan: Resume concentrated TPN to goal 70 mL/hr at 1800 Electrolytes in TPN: Na 150 mEq/L, remove K 4/30, Ca 3 mEq/L, Mg 0 mEq/L, and remove Phos 4/30. Max acetate. Add standard MVI and trace elements to TPN, no chromium on renal replacement Resume Sensitive q6h SSI and adjust as needed  Stop D10W at 1800 Monitor TPN labs on Mon/Thurs, PRN; next labs 5/1 AM  Thank you for involving pharmacy in this patient's care.  Heddy Liverpool, PharmD PGY2 Critical Care Pharmacy Resident 01/29/2024 8:36 AM

## 2024-01-29 NOTE — Progress Notes (Signed)
 IR Brief Progress Note:  Patient now agreeable to drain placement. Team also notified IR that patient will likely need to restart dialysis. Consent obtained from son Shaikh Zobell) for these procedures (intra abd fluid collection drain and trialysis HD cath).   Discussed in case in IR huddle. Plan will be to complete this today.

## 2024-01-29 NOTE — Sedation Documentation (Signed)
 Transfer patient from CT3 to IR1 for another procedure non sedation case.

## 2024-01-29 NOTE — TOC Progression Note (Signed)
 Transition of Care [facility]) - Progression Note    Patient Details  Name: Alexander [name] MRN: [id number] Date of Birth: 07-Aug-1961  [name] CM/SW Contact  Juliane Och, LCSW Phone Number: 01/29/2024, 12:05 PM  Clinical Narrative:     12:05 PM Per bedside RN, patient and patient's son expressed interest in patient applying for SSI/disability. CSW consulted financial counseling. Financial counseling is following.   Expected Discharge Plan: Long Term Acute Care (LTAC) Barriers to Discharge: Continued Medical Work up  Expected Discharge Plan and Services   Discharge Planning Services: CM Consult   Living arrangements for the past 2 months: Single Family Home                                       Social Determinants of Health (SDOH) Interventions SDOH Screenings   Food Insecurity: No Food Insecurity (01/12/2024)  Housing: Low Risk  (01/12/2024)  Transportation Needs: No Transportation Needs (01/12/2024)  Utilities: Not At Risk (01/12/2024)  Depression (PHQ2-9): Low Risk  (07/24/2023)  Tobacco Use: Medium Risk (01/15/2024)    Readmission Risk Interventions    01/24/2024   10:43 AM 01/13/2024    9:13 AM 07/11/2023    2:11 PM  Readmission Risk Prevention Plan  Post Dischage Appt   Complete  Medication Screening   Complete  Transportation Screening Complete Complete Complete  PCP or Specialist Appt within 3-5 Days Complete Complete   HRI or Home Care Consult Complete Complete   Social Work Consult for Recovery Care Planning/Counseling Complete Complete   Palliative Care Screening Not Applicable Not Applicable   Medication Review Oceanographer) Complete Complete

## 2024-01-29 NOTE — Progress Notes (Addendum)
 [facility] Progress Note Patient: Alexander [name] ZOX:[id number] DOB: [birth year] DOA: 01/11/2024  DOS: the patient was seen and examined on 01/29/2024  Brief Hospital Course: PMH of PE on Coumadin , HFpEF presented to the hospital with BRBPR.  Was found to have colon perforation.  GI, general surgery were consulted.  Underwent ex lap followed by ICU stay.  Developed AKI requiring CRRT. Now out of the ICU but remains volume overloaded and getting HD.  Significant event. /12 admitted for GI bleed and BRBPR.  GI consulted conservative measures.  EGD colonoscopy was recommended. 4/15 repeat CT scan shows evidence of concern for contained perforation in the ascending colon.  General surgery consulted.  Recommending ex lap. 4/16 transferred to ICU. 4/16 cardiorespiratory arrest, achieved ROSC. 4/17 back to the OR, wound VAC placement and small bowel resection. 4/19 Back to the OR s/p exploratory laparotomy, ileostomy creation, small bowel resection, wound VAC in place. nephrology consulted for CRRT 4/23 extubated; off pressors 4/25 Transferred from [facility] ICU to [facility] progressive due to need for hemodialysis. 4/29 triple-lumen CVC placement for TPN. 4/30 CT-guided drain placement.  Dark bloody output.  Anticoagulation on hold.  Also HD catheter placement on right with plan for inpatient HD.  Assessment and Plan: Spontaneous perforation of colon SP right colectomy. Underwent 3 ex lap as above, SP right hemicolectomy and small bowel resection as well as ileostomy creation. Has a wound VAC. Currently has IV TPN. Management per general surgery. 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 Current plan is conservative management with a drain and a controlled fistula. Bowel rest recommended. NG recommended-patient refused.  Remains NPO.   AKI  Initially required CRRT. Suspect  combination of shock with ATN. Possibility of IV contrast injury also cannot be ruled out. Continues to third space.  Reports of arthralgia. Nephrology planning to restart HD after a temp HD catheter placement likely Thursday.   Acute blood loss anemia as well as anemia of critical illness Iron  deficiency anemia.  B12 deficiency. Presented with BRBPR.  Hemoglobin at baseline around 10.  Hemoglobin on admission was 7.6.  Hemoglobin at its lowest point was 4.0 on 4/17. Currently hemoglobin is stable around 7 Transfuse hemoglobin less than 7. Last PRBC transfusion was on 4/29 2 units. Currently holding heparin . Folate normal, B12 low.  Iron  repleted.  B12 repleted.  Monitor.  Multifactorial shock. Patient has needed vasopressors throughout the hospital stay multiple times. Currently blood pressure remains on the softer side. Monitor.  Septic shock.  Present on admission. Sepsis physiology appears to have resolved for now.  Hypotension. Developed another episode of hypotension on 4/29. Most likely in the setting of pain medication. Zosyn  have been resumed. Improving leukocytosis. Will monitor for improvement. IV albumin  given.  History of PE. 07/2023. Presented with BRBPR.  Anticoagulation has been on hold.  Was resumed with heparin . Given anemia seen again on 4/29 we will continue to hold heparin . Especially in the setting of drain output being bloody.   Acute hypoxic respiratory failure Requiring intubation. Was extubated on 4/23.  Intermittently on oxygen.  Continue incentive spirometry.   Diverticulitis Treated before.  Agitation. Likely hospital induced delirium. Monitor.  Hypoglycemia. As the patient was not able to get his TPN his blood sugars were low. Treated with D10.  Acute right knee pain. Reports pins-and-needles like pain in his right knee. Suspect arthralgia in the setting of need for HD. X-ray  unremarkable. No evidence of inflammatory process. For  now we will monitor.   Subjective: No nausea no vomiting.  Reports severe pain in his right knee.  Feels like pins-and-needles.  No shortness of breath.  RN reports that the patient has significant swelling involving his lower extremity, back as well as scrotal area.  Reported some heartburn/chest pain later in the day.  Physical Exam: General: in moderate distress, No Rash Cardiovascular: S1 and S2 Present, No Murmur Respiratory: Good respiratory effort, Bilateral Air entry present. No Crackles, No wheezes Abdomen: Bowel Sound absent, diffuse tenderness Extremities: Bilateral dependent edema Neuro: Alert and oriented x3, no new focal deficit  Data Reviewed: I have Reviewed nursing notes, Vitals, and Lab results. Since last encounter, pertinent lab results CBC and BMP   . I have ordered test including CBC and CMP  . I have discussed pt's care plan and test results with pharmacy, surgery  . I have ordered imaging x-ray knee  .  Disposition: Status is: Inpatient Remains inpatient appropriate because: Monitor for improvement in anemia, oral intake as well as infection  SCD's Start: 01/15/24 1400   Family Communication: Discussed with son at bedside Level of care: Progressive   Vitals:   01/29/24 1600 01/29/24 1610 01/29/24 1625 01/29/24 1700  BP: 125/73 133/71 132/65 131/68  Pulse: 88 92 92   Resp: 20 18 16 11   Temp:    97.7 F (36.5 C)  TempSrc:    Oral  SpO2: 100% 99% 100% 98%  Weight:      Height:        The patient is critically ill with multiple organ systems failure and requires high complexity decision making for assessment and support, frequent evaluation and titration of therapies. Critical Care Time devoted to patient care services described in this note is 35 minutes  Author: Charlean Congress, MD 01/29/2024 7:11 PM  Please look on [email or web address] to find out who is on call.

## 2024-01-29 NOTE — Progress Notes (Signed)
 11 Days Post-Op  Subjective: Feeling a little bit better today, feels very thirsty and multiple times requesting ice chips.  Declined drain placement when he was in IR yesterday unfortunately, but is agreeable to it today.  Did receive blood transfusion yesterday with improved hemoglobin this morning to 7.5.  Objective: Vital signs in last 24 hours: Temp:  [97.5 F (36.4 C)-98.8 F (37.1 C)] 97.7 F (36.5 C) (04/30 0354) Pulse Rate:  [95-147] 95 (04/30 0700) Resp:  [12-23] 19 (04/30 0700) BP: (84-126)/(48-98) 123/74 (04/30 0700) SpO2:  [92 %-96 %] 96 % (04/30 0700) Last BM Date : 01/28/24  Intake/Output from previous day: 04/29 0701 - 04/30 0700 In: 3389.9 [I.V.:1609.9; Blood:630; IV Piggyback:1150] Out: 420 [Drains:20; Stool:400] Intake/Output this shift: No intake/output data recorded.  PE: Gen: lethargic, occasionally moaning Abd: Soft, mild tenderness diffusely. Ileostomy viable with thin dark output in bag. Midline wound as noted below - mostly granulation tissues, some areas w/ fibrinous tissue at the base, copious serosanguineous drainage today which is new. JP in place, 20cc of output - ss  Lab Results:  Recent Labs    01/28/24 1745 01/29/24 0449  WBC 29.3* 24.3*  HGB 6.3* 7.5*  HCT 19.8* 22.9*  PLT 628* 491*   BMET Recent Labs    01/28/24 1653 01/29/24 0449  NA 140 140  K 5.3* 5.3*  CL 104 105  CO2 21* 20*  GLUCOSE 81 90  BUN 81* 87*  CREATININE 5.79* 6.01*  CALCIUM  7.9* 7.8*   PT/INR Recent Labs    01/27/24 0045  LABPROT 15.2  INR 1.2   CMP     Component Value Date/Time   NA 140 01/29/2024 0449   NA 141 01/22/2020 1137   K 5.3 (H) 01/29/2024 0449   CL 105 01/29/2024 0449   CO2 20 (L) 01/29/2024 0449   GLUCOSE 90 01/29/2024 0449   BUN 87 (H) 01/29/2024 0449   BUN 13 01/22/2020 1137   CREATININE 6.01 (H) 01/29/2024 0449   CREATININE 0.98 06/14/2017 0935   CALCIUM  7.8 (L) 01/29/2024 0449   PROT 5.8 (L) 01/28/2024 1653   PROT 7.3  01/22/2020 1137   ALBUMIN  <1.5 (L) 01/28/2024 1653   ALBUMIN  4.2 01/22/2020 1137   AST 33 01/28/2024 1653   ALT 49 (H) 01/28/2024 1653   ALKPHOS 65 01/28/2024 1653   BILITOT 1.5 (H) 01/28/2024 1653   BILITOT 0.4 01/22/2020 1137   GFRNONAA 10 (L) 01/29/2024 0449   GFRNONAA >89 09/17/2014 1253   GFRAA 111 01/22/2020 1137   GFRAA >89 09/17/2014 1253   Lipase     Component Value Date/Time   LIPASE 22 09/28/2019 1109       Studies/Results: IR Fluoro Guide CV Line Right Result Date: 01/28/2024 INDICATION: 63 year old male referred for non tunneled central venous catheter EXAM: IMAGE GUIDED NON TUNNELED CENTRAL VENOUS CATHETER MEDICATIONS: None ANESTHESIA/SEDATION: Moderate (conscious) sedation was not employed during this procedure. FLUOROSCOPY: Radiation Exposure Index (as provided by the fluoroscopic device): 10.1 mGy Kerma COMPLICATIONS: None PROCEDURE: Informed written consent was obtained from the patient's family after a discussion of the risks, benefits, and alternatives to treatment. Questions regarding the procedure were encouraged and answered. The right neck was prepped with chlorhexidine  in a sterile fashion, and a sterile drape was applied covering the operative field. Maximum barrier sterile technique with sterile gowns and gloves were used for the procedure. A timeout was performed prior to the initiation of the procedure. A micropuncture kit was utilized to access the right  internal jugular vein under direct, real-time ultrasound guidance after the overlying soft tissues were anesthetized with 1% lidocaine  with epinephrine . Ultrasound image documentation was performed. Wire was advanced to the level of the IVC. A triple-lumen standard 30 cm central venous catheter was advanced on the wire, with the tip position at the level of the right atrium. Final catheter positioning was confirmed and documented with a spot radiographic image. The catheter aspirates and flushes normally. The  catheter was flushed with appropriate volume heparin  dwells. Dressings were applied. The patient tolerated the procedure well without immediate post procedural complication. IMPRESSION: Status post image guided right IJ non tunneled central venous catheter. Signed, Marciano Settles. Rexine Cater, RPVI [facility] Radiology Electronically Signed   By: Myrlene Asper D.O.   On: 01/28/2024 16:15   CT ABDOMEN PELVIS WO CONTRAST Result Date: 01/28/2024 CLINICAL DATA:  Abdominal pain, post-op eval for infection, post op complication EXAM: CT ABDOMEN AND PELVIS WITHOUT CONTRAST TECHNIQUE: Multidetector CT imaging of the abdomen and pelvis was performed following the standard protocol without IV contrast. RADIATION DOSE REDUCTION: This exam was performed according to the departmental dose-optimization program which includes automated exposure control, adjustment of the mA and/or kV according to patient size and/or use of iterative reconstruction technique. COMPARISON:  January 24, 2024, January 14, 2024, January 11, 2024 FINDINGS: Of note, the lack of intravenous contrast limits evaluation of the solid organ parenchyma and vascularity. The study is degraded by patient's arm positioning. Lower chest: Patchy ground-glass airspace opacities in the visualized lungs. Small bilateral pleural effusions with bibasilar compressive atelectasis, larger on the right than the left. Hepatobiliary: No mass. No radiopaque stones or wall thickening of the gallbladder. No intrahepatic or extrahepatic biliary ductal dilation. Pancreas: No mass or main ductal dilation. No peripancreatic inflammation or fluid collection. Spleen: Normal size. No mass. Adrenals/Urinary Tract: No adrenal masses. No renal mass. No hydronephrosis or nephrolithiasis. Mostly decompressed urinary bladder with urinary catheter in place. Stomach/Bowel: The stomach contains ingested material without focal abnormality. Mild diffuse  wall thickening throughout the small bowel. Gaseous distension of a short segment of small bowel measuring 4 cm in the upper abdomen. Right lower quadrant diverting ileostomy with an extended right hemicolectomy. Descending and sigmoid colonic diverticulosis. Vascular/Lymphatic: No aortic aneurysm. Scattered aortoiliac atherosclerosis. No intraabdominal or pelvic lymphadenopathy. Reproductive: No prostatomegaly. No free pelvic fluid. Other: There is a large, thick-walled fluid collection in the lower abdomen and pelvis, measuring 10.9 x 15.8 x 13.1 cm. Hyperdense layering material present within this fluid collection, which may represent ingested material or hemorrhage. Multiple locules of gas noted along the ventral incision (axial 55-70), extending into the midline incision subcutaneous fat, worrisome for extraluminal gas with possible fistulous communication. Diffuse mesenteric edema with layering ascites in the left paracolic gutter. There is also loculated fluid in the right paracolic gutter. Postsurgical drainage catheter extending from the left lower quadrant across the upper abdomen and terminating in the right paracolic gutter. Musculoskeletal: No acute fracture or destructive lesion. Multilevel degenerative disc disease of the spine. 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 Diffuse anasarca. IMPRESSION: 1. Large, thick-walled fluid collection in the lower abdomen and pelvis, measuring 10.9 x 15.8 x 13.1 cm (APxTRxCC). Hyperdense material in this collection likely reflects a combination of ingested material and enteric contrast from the prior CT rather than or hemorrhage. Given these findings, this is worrisome for underlying small-bowel perforation. 2. Moderate wall thickening  throughout the majority of the small bowel, likely reactive to the intra-abdominal inflammation. Additionally, gaseous distension of a couple of  segments of small bowel in the upper abdomen, possibly reflect changes of a reactive ileus. 3. Increasing locules of gas subjacent to the midline ventral incision (axial 55-70), extending the peritoneum to the subcutaneous fat, worrisome for extraluminal gas with possible fistulous communication. 4. Patchy ground-glass airspace opacities in the visualized lungs, which may represent underlying viral pneumonia or early pulmonary edema. Small bilateral pleural effusions, larger on the right than the left. 5. Diffuse anasarca. Critical Value/emergent results were called by telephone at the time of interpretation on 01/28/2024 at 11:21 am to provider [facility] , who verbally acknowledged these results. Electronically Signed   By: Rance Burrows M.D.   On: 01/28/2024 11:30     Anti-infectives: Anti-infectives (From admission, onward)    Start     Dose/Rate Route Frequency Ordered Stop   01/28/24 1000  piperacillin -tazobactam (ZOSYN ) IVPB 2.25 g        2.25 g 100 mL/hr over 30 Minutes Intravenous Every 8 hours 01/28/24 0909     01/18/24 1400  piperacillin -tazobactam (ZOSYN ) IVPB 3.375 g  Status:  Discontinued        3.375 g 12.5 mL/hr over 240 Minutes Intravenous Every 6 hours 01/18/24 1241 01/18/24 1242   01/18/24 1400  piperacillin -tazobactam (ZOSYN ) IVPB 3.375 g        3.375 g 100 mL/hr over 30 Minutes Intravenous Every 6 hours 01/18/24 1242 01/23/24 2046   01/18/24 0000  piperacillin -tazobactam (ZOSYN ) IVPB 2.25 g  Status:  Discontinued        2.25 g 100 mL/hr over 30 Minutes Intravenous Every 6 hours 01/17/24 2120 01/18/24 1241   01/12/24 0200  piperacillin -tazobactam (ZOSYN ) IVPB 3.375 g  Status:  Discontinued        3.375 g 12.5 mL/hr over 240 Minutes Intravenous Every 8 hours 01/11/24 2120 01/17/24 2120   01/11/24 1815  piperacillin -tazobactam (ZOSYN ) IVPB 3.375 g        3.375 g 100 mL/hr over 30 Minutes Intravenous  Once 01/11/24 1806 01/11/24 1921         Assessment/Plan LAPAROTOMY, EXPLORATORY RIGHT COLECTOMY (without anastomosis) PLACEMENT OF ABTHERA WOUND VAC DRAINAGE OF INTRA-ABDOMINAL ABSCESS  4/16 EW   RE-EXPLORATION ABDOMEN, PLACEMENT OF ABTHERA WOUND VAC, SMALL BOWEL RESECTION 01/16/2024 EW   Re-exploration abdomen, small bowel resection, creation of end ileostomy, abd closure, placement wound vac 4/19 EW  - TNA held due to one of his lines being removed - CT 4/29 with large pelvic fluid collection with layering hyperdensity concerning for either extravasated oral contrast from CT 4 days prior versus hematoma (less likely). He is 2 weeks post op from multiple abdominal operations and will be very  high risk to return to the OR with risk for EC fistula, more perforations, further leak from this site despite repair secondary to poor nutrition etc.  We will try to treat this conservatively with a drain and controlled fistula.  He will need bowel rest and his NGT replaced.  Continue TNA for nutrition - Cont to monitor ostomy output - Completed post op abx, but WBC increasing, tachy, lethargic 4/29, with increasing temp-Zosyn  restarted - IR for percutaneous drainage today to hopefully create a controlled fistula assuming that his CT results reflect leak.  FEN - NPO for procedure VTE - SCDs, heparin  gtt  ID - Completed post op abx. WBC 26.1 yesterday, 24.3 today.  Restart abx 4/29 given clinical  status of patient.  - Per TRH -  Pleural effusions Septic shock - resolved ABL anemia - monitor and transfuse as needed (14 u prbc total, 1u cryo, 6u FFP, 1 plt) Coagulopathy - resolved AKI - started CRRT 4/19,  defer to renal.  Holding HD as UOP is good and Cr improving Acute hypoxemic resp failure - extubated H/o VTE/PE/DVT - scds for now; was on coumadin      LOS: 18 days    Adalberto Acton ,MD [facility] Surgery 01/29/2024, 7:41 AM Please see Amion for pager number during day hours 7:00am-4:30pm or 7:00am -11:30am on  weekends

## 2024-01-29 NOTE — Progress Notes (Signed)
 [facility] NEPHROLOGY PROGRESS NOTE  Assessment/ Plan: Pt is a 63 y.o. yo male with past medical history of DVT, PE on Coumadin  admitted on 4/12 with rectal bleed and hypotension seen for AKI.  # AKI - mostly anuric, creat 1.0 early in this hospital stay. After onset of severe shock 4/16 pt had progressive AKI. UA negative. Renal US  shows no obstruction. AKI due to ATN primarily due to shock +/- IV contrast.  CRRT from 4/19-4/24. Urine output slowly increasing but creatinine rising.  Showing some moderate signs of improvement - Last HD on 4/27 and UOP was sun-mon from 0400-0400 then till Tues . Nothing recorded but there is in the foley bag but renal function still worsening. - Almost certainly will have to start dialysis on Thur; will ask VIR for temp cath today.   #S/P cardiac arrest: 4/17  #Shock/ hypotension: Off of pressors now.  # Acute diverticulitis: w/ perforated ascending colon, s/p ileostomy, R hemicolectomy and some small bowel resection. Per gen surg/ pmd. New fluid collection in abd but pt refused abd drain.   On TPN  #VDRF: Improved now extubated   Subjective: Low BS this AM and Hb6.2 yest -> 2U. Pain all over.  Son bedside and updated. Denies nausea, shortness of breath  Last  dialysis on 4/27.  Objective Vital signs in last 24 hours: Vitals:   01/29/24 0007 01/29/24 0008 01/29/24 0249 01/29/24 0354  BP: 119/67 119/67 122/67 126/69  Pulse: (!) 116 (!) 120 (!) 102 (!) 102  Resp: 14 15 13 20   Temp: 98.2 F (36.8 C) 98.2 F (36.8 C) 98.6 F (37 C) 97.7 F (36.5 C)  TempSrc: Oral Oral Oral Oral  SpO2: 96% 96% 93% 94%  Weight:      Height:       Weight change:   Intake/Output Summary (Last 24 hours) at 01/29/2024 0711 Last data filed at 01/29/2024 4098 Gross per 24 hour  Intake 3389.91 ml  Output 420 ml  Net 2969.91 ml       Labs: RENAL PANEL Recent Labs  Lab 01/24/24 0552 01/25/24 0540 01/26/24 0610  01/27/24 0045 01/28/24 1653 01/29/24 0449  NA 138 140 142 139 140 140  K 3.5 3.6 3.4* 3.3* 5.3* 5.3*  CL 103 106 106 103 104 105  CO2 25 22 20* 25 21* 20*  GLUCOSE 144* 153* 152* 108* 81 90  BUN 57* 81* 97* 55* 81* 87*  CREATININE 3.89* 5.21* 6.11* 3.91* 5.79* 6.01*  CALCIUM  7.4* 7.6* 7.7* 7.8* 7.9* 7.8*  MG 2.3 2.2 2.1 1.7 2.0  --   PHOS 4.5 4.7* 4.5 2.3*  --  8.5*  ALBUMIN  1.5*  1.6* <1.5* <1.5* <1.5* <1.5*  --     Liver Function Tests: Recent Labs  Lab 01/23/24 0505 01/24/24 0552 01/25/24 0540 01/26/24 0610 01/27/24 0045 01/28/24 1653  AST 57* 41  --   --   --  33  ALT 179* 118*  --   --   --  49*  ALKPHOS 69 63  --   --   --  65  BILITOT 2.2* 1.8*  --   --   --  1.5*  PROT 5.4* 5.4*  --   --   --  5.8*  ALBUMIN  1.7* 1.5*  1.6*   < > <1.5* <1.5* <1.5*   < > = values in this interval not displayed.   No results for input(s): "LIPASE", "AMYLASE" in the last 168 hours. No results for  input(s): "AMMONIA" in the last 168 hours. CBC: Recent Labs    07/19/23 0448 07/22/23 0441 12/03/23 1437 12/04/23 1531 12/05/23 0036 01/11/24 1458 01/11/24 1957 01/12/24 0056 01/12/24 1044 01/12/24 1552 01/24/24 0552 01/26/24 0610 01/27/24 0045 01/28/24 1745 01/29/24 0449  HGB 10.1*   < > 7.6 Repeated and verified X2.*   < >  --    < > 6.7*   < >  --    < > 8.5* 7.9* 8.2* 6.3* 7.5*  MCV 92.7   < > 75.5*   < >  --    < > 76.6*   < >  --    < > 95.5 92.5 91.4 94.7 91.6  VITAMINB12 222  --   --   --  100*  --   --   --  93*  --   --   --   --   --   --   FOLATE 7.5  --   --   --  8.0  --   --   --  9.3  --   --   --   --   --   --   FERRITIN 25  --  4.5*  --  3*  --  19*  --   --   --   --   --   --   --   --   TIBC 328  --  382.2  --  364  --  293  --   --   --   --   --   --   --   --   IRON  25*  --  15*  --  11*  --  11*  --   --   --   --   --   --   --   --   RETICCTPCT 1.4  --   --   --  0.7  --  0.8  --   --   --   --   --   --   --   --    < > = values in this interval  not displayed.    Cardiac Enzymes: No results for input(s): "CKTOTAL", "CKMB", "CKMBINDEX", "TROPONINI" in the last 168 hours. CBG: Recent Labs  Lab 01/28/24 1701 01/28/24 1711 01/28/24 2328 01/29/24 0000 01/29/24 0617  GLUCAP 52* 77 64* 96 73    Iron  Studies: No results for input(s): "IRON ", "TIBC", "TRANSFERRIN", "FERRITIN" in the last 72 hours. Studies/Results: IR Fluoro Guide CV Line Right Result Date: 01/28/2024 INDICATION: 63 year old male referred for non tunneled central venous catheter EXAM: IMAGE GUIDED NON TUNNELED CENTRAL VENOUS CATHETER MEDICATIONS: None ANESTHESIA/SEDATION: Moderate (conscious) sedation was not employed during this procedure. FLUOROSCOPY: Radiation Exposure Index (as provided by the fluoroscopic device): 10.1 mGy Kerma COMPLICATIONS: None PROCEDURE: Informed written consent was obtained from the patient's family after a discussion of the risks, benefits, and alternatives to treatment. Questions regarding the procedure were encouraged and answered. The right neck was prepped with chlorhexidine  in a sterile fashion, and a sterile drape was applied covering the operative field. Maximum barrier sterile technique with sterile gowns and gloves were used for the procedure. A timeout was performed prior to the initiation of the procedure. A micropuncture kit was utilized to access the right internal jugular vein under direct, real-time ultrasound guidance after the overlying soft tissues were anesthetized with 1% lidocaine  with epinephrine . Ultrasound image documentation was performed. Wire was advanced to the level of  the IVC. A triple-lumen standard 30 cm central venous catheter was advanced on the wire, with the tip position at the level of the right atrium. Final catheter positioning was confirmed and documented with a spot radiographic image. The catheter aspirates and flushes normally. The catheter was flushed with appropriate volume heparin  dwells. Dressings were  applied. The patient tolerated the procedure well without immediate post procedural complication. IMPRESSION: Status post image guided right IJ non tunneled central venous catheter. Signed, Marciano Settles. Rexine Cater, RPVI [facility] Inter[facility] Radiology Special[facility] Radiology Electronically Signed   By: Myrlene Asper D.O.   On: 01/28/2024 16:15   CT ABDOMEN PELVIS WO CONTRAST Result Date: 01/28/2024 CLINICAL DATA:  Abdominal pain, post-op eval for infection, post op complication EXAM: CT ABDOMEN AND PELVIS WITHOUT CONTRAST TECHNIQUE: Multidetector CT imaging of the abdomen and pelvis was performed following the standard protocol without IV contrast. RADIATION DOSE REDUCTION: This exam was performed according to the departmental dose-optimization program which includes automated exposure control, adjustment of the mA and/or kV according to patient size and/or use of iterative reconstruction technique. COMPARISON:  January 24, 2024, January 14, 2024, January 11, 2024 FINDINGS: Of note, the lack of intravenous contrast limits evaluation of the solid organ parenchyma and vascularity. The study is degraded by patient's arm positioning. Lower chest: Patchy ground-glass airspace opacities in the visualized lungs. Small bilateral pleural effusions with bibasilar compressive atelectasis, larger on the right than the left. Hepatobiliary: No mass. No radiopaque stones or wall thickening of the gallbladder. No intrahepatic or extrahepatic biliary ductal dilation. Pancreas: No mass or main ductal dilation. No peripancreatic inflammation or fluid collection. Spleen: Normal size. No mass. Adrenals/Urinary Tract: No adrenal masses. No renal mass. No hydronephrosis or nephrolithiasis. Mostly decompressed urinary bladder with urinary catheter in place. Stomach/Bowel: The stomach contains ingested material without focal abnormality. Mild diffuse wall thickening throughout the small bowel. Gaseous distension of a short  segment of small bowel measuring 4 cm in the upper abdomen. Right lower quadrant diverting ileostomy with an extended right hemicolectomy. Descending and sigmoid colonic diverticulosis. Vascular/Lymphatic: No aortic aneurysm. Scattered aortoiliac atherosclerosis. No intraabdominal or pelvic lymphadenopathy. Reproductive: No prostatomegaly. No free pelvic fluid. Other: There is a large, thick-walled fluid collection in the lower abdomen and pelvis, measuring 10.9 x 15.8 x 13.1 cm. Hyperdense layering material present within this fluid collection, which may represent ingested material or hemorrhage. Multiple locules of gas noted along the ventral incision (axial 55-70), extending into the midline incision subcutaneous fat, worrisome for extraluminal gas with possible fistulous communication. Diffuse mesenteric edema with layering ascites in the left paracolic gutter. There is also loculated fluid in the right paracolic gutter. Postsurgical drainage catheter extending from the left lower quadrant across the upper abdomen and terminating in the right paracolic gutter. Musculoskeletal: No acute fracture or destructive lesion. Multilevel degenerative disc disease of the spine. 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 Diffuse anasarca. IMPRESSION: 1. Large, thick-walled fluid collection in the lower abdomen and pelvis, measuring 10.9 x 15.8 x 13.1 cm (APxTRxCC). Hyperdense material in this collection likely reflects a combination of ingested material and enteric contrast from the prior CT rather than or hemorrhage. Given these findings, this is worrisome for underlying small-bowel perforation. 2. Moderate wall thickening throughout the majority of the small bowel, likely reactive to the intra-abdominal inflammation. Additionally, gaseous distension of a couple of segments of small bowel in the upper abdomen, possibly reflect changes of  a  reactive ileus. 3. Increasing locules of gas subjacent to the midline ventral incision (axial 55-70), extending the peritoneum to the subcutaneous fat, worrisome for extraluminal gas with possible fistulous communication. 4. Patchy ground-glass airspace opacities in the visualized lungs, which may represent underlying viral pneumonia or early pulmonary edema. Small bilateral pleural effusions, larger on the right than the left. 5. Diffuse anasarca. Critical Value/emergent results were called by telephone at the time of interpretation on 01/28/2024 at 11:21 am to provider [facility] , who verbally acknowledged these results. Electronically Signed   By: Rance Burrows M.D.   On: 01/28/2024 11:30      Medications: Infusions:  sodium chloride  Stopped (01/24/24 2213)   sodium chloride  100 mL/hr at 01/29/24 9147   norepinephrine  (LEVOPHED ) Adult infusion     piperacillin -tazobactam Stopped (01/29/24 0242)    Scheduled Medications:  Chlorhexidine  Gluconate Cloth  6 each Topical Daily   dextrose   25 g Intravenous STAT   mouth rinse  15 mL Mouth Rinse 4 times per day   pantoprazole  (PROTONIX ) IV  40 mg Intravenous Q24H    have reviewed scheduled and prn medications.  Physical Exam: GEN: sitting in bed, nad ENT: no nasal discharge, mmm EYES: no scleral icterus, eomi CV: normal rate, no murmurs PULM: no iwob, bilateral chest rise ABD: NABS, non-distended SKIN: no rashes or jaundice EXT: tr edema, warm and well perfused   Alexander [name] W 01/29/2024,7:11 AM  LOS: 18 days

## 2024-01-29 NOTE — Progress Notes (Addendum)
 Pt's CBG 64, one half amp dextrose  administered per protocol. Recheck CBG after 15 minutes was 96. Attempted to notify Segars, MD.  Sonjia Durie, RN

## 2024-01-29 NOTE — Procedures (Signed)
 Interventional Radiology Procedure Note  Procedure: Temporary hemodialysis catheter placement  Findings: Please refer to procedural dictation for full description. Right internal jugular 16 cm Trialysis.  Complications: None immediate  Estimated Blood Loss: <5 mL  Recommendations: Catheter ready for immediate use.   Creasie Doctor, MD

## 2024-01-30 ENCOUNTER — Encounter (HOSPITAL_COMMUNITY): Payer: Self-pay | Admitting: Radiology

## 2024-01-30 DIAGNOSIS — K631 Perforation of intestine (nontraumatic): Secondary | ICD-10-CM | POA: Diagnosis not present

## 2024-01-30 LAB — TYPE AND SCREEN
ABO/RH(D): B POS
Antibody Screen: NEGATIVE
Unit division: 0
Unit division: 0
Unit division: 0

## 2024-01-30 LAB — COMPREHENSIVE METABOLIC PANEL WITH GFR
ALT: 44 U/L (ref 0–44)
AST: 39 U/L (ref 15–41)
Albumin: <1.5 g/dL — ABNORMAL LOW (ref 3.5–5.0)
Alkaline Phosphatase: 56 U/L (ref 38–126)
Anion gap: 17 — ABNORMAL HIGH (ref 5–15)
BUN: 89 mg/dL — ABNORMAL HIGH (ref 8–23)
CO2: 19 mmol/L — ABNORMAL LOW (ref 22–32)
Calcium: 7.6 mg/dL — ABNORMAL LOW (ref 8.9–10.3)
Chloride: 107 mmol/L (ref 98–111)
Creatinine, Ser: 5.91 mg/dL — ABNORMAL HIGH (ref 0.61–1.24)
GFR, Estimated: 10 mL/min — ABNORMAL LOW (ref >60)
Glucose, Bld: 153 mg/dL — ABNORMAL HIGH (ref 70–99)
Potassium: 4.1 mmol/L (ref 3.5–5.1)
Sodium: 143 mmol/L (ref 135–145)
Total Bilirubin: 1.4 mg/dL — ABNORMAL HIGH (ref 0.0–1.2)
Total Protein: 5.9 g/dL — ABNORMAL LOW (ref 6.5–8.1)

## 2024-01-30 LAB — BPAM RBC
Blood Product Expiration Date: 202505212359
Blood Product Expiration Date: 202505212359
Blood Product Expiration Date: 202505272359
ISSUE DATE / TIME: 202504292010
ISSUE DATE / TIME: 202504292342
ISSUE DATE / TIME: 202504302334
Unit Type and Rh: 7300
Unit Type and Rh: 7300
Unit Type and Rh: 7300

## 2024-01-30 LAB — CBC
HCT: 22.3 % — ABNORMAL LOW (ref 39.0–52.0)
HCT: 22.1 % — ABNORMAL LOW (ref 39.0–52.0)
HCT: 22.2 % — ABNORMAL LOW (ref 39.0–52.0)
Hemoglobin: 7.5 g/dL — ABNORMAL LOW (ref 13.0–17.0)
Hemoglobin: 7.3 g/dL — ABNORMAL LOW (ref 13.0–17.0)
Hemoglobin: 7.4 g/dL — ABNORMAL LOW (ref 13.0–17.0)
MCH: 29.8 pg (ref 26.0–34.0)
MCH: 29.7 pg (ref 26.0–34.0)
MCH: 29.5 pg (ref 26.0–34.0)
MCHC: 33.6 g/dL (ref 30.0–36.0)
MCHC: 33 g/dL (ref 30.0–36.0)
MCHC: 33.3 g/dL (ref 30.0–36.0)
MCV: 88.5 fL (ref 80.0–100.0)
MCV: 89.8 fL (ref 80.0–100.0)
MCV: 88.4 fL (ref 80.0–100.0)
Platelets: 386 10*3/uL (ref 150–400)
Platelets: 361 10*3/uL (ref 150–400)
Platelets: 335 10*3/uL (ref 150–400)
RBC: 2.52 MIL/uL — ABNORMAL LOW (ref 4.22–5.81)
RBC: 2.46 MIL/uL — ABNORMAL LOW (ref 4.22–5.81)
RBC: 2.51 MIL/uL — ABNORMAL LOW (ref 4.22–5.81)
RDW: 17.9 % — ABNORMAL HIGH (ref 11.5–15.5)
RDW: 18.1 % — ABNORMAL HIGH (ref 11.5–15.5)
RDW: 18 % — ABNORMAL HIGH (ref 11.5–15.5)
WBC: 12.6 10*3/uL — ABNORMAL HIGH (ref 4.0–10.5)
WBC: 11.4 10*3/uL — ABNORMAL HIGH (ref 4.0–10.5)
WBC: 11.5 10*3/uL — ABNORMAL HIGH (ref 4.0–10.5)
nRBC: 0 % (ref 0.0–0.2)
nRBC: 0 % (ref 0.0–0.2)
nRBC: 0 % (ref 0.0–0.2)

## 2024-01-30 LAB — GLUCOSE, CAPILLARY
Glucose-Capillary: 130 mg/dL — ABNORMAL HIGH (ref 70–99)
Glucose-Capillary: 133 mg/dL — ABNORMAL HIGH (ref 70–99)
Glucose-Capillary: 142 mg/dL — ABNORMAL HIGH (ref 70–99)
Glucose-Capillary: 144 mg/dL — ABNORMAL HIGH (ref 70–99)
Glucose-Capillary: 147 mg/dL — ABNORMAL HIGH (ref 70–99)
Glucose-Capillary: 155 mg/dL — ABNORMAL HIGH (ref 70–99)
Glucose-Capillary: 155 mg/dL — ABNORMAL HIGH (ref 70–99)
Glucose-Capillary: 87 mg/dL (ref 70–99)

## 2024-01-30 LAB — CATH TIP CULTURE: Culture: NO GROWTH

## 2024-01-30 LAB — SEDIMENTATION RATE: Sed Rate: 107 mm/h — ABNORMAL HIGH (ref 0–16)

## 2024-01-30 LAB — C-REACTIVE PROTEIN: CRP: 19.3 mg/dL — ABNORMAL HIGH (ref <1.0)

## 2024-01-30 LAB — PHOSPHORUS: Phosphorus: 6.2 mg/dL — ABNORMAL HIGH (ref 2.5–4.6)

## 2024-01-30 LAB — URIC ACID: Uric Acid, Serum: 7 mg/dL (ref 3.7–8.6)

## 2024-01-30 LAB — MAGNESIUM: Magnesium: 1.9 mg/dL (ref 1.7–2.4)

## 2024-01-30 MED ORDER — SODIUM ACETATE 2 MEQ/ML IV SOLN
INTRAVENOUS | Status: AC
  Filled 2024-01-30: qty 761.6

## 2024-01-30 MED ORDER — SODIUM CHLORIDE 0.9 % IV SOLN
12.5000 mg | Freq: Three times a day (TID) | INTRAVENOUS | Status: DC | PRN
  Filled 2024-01-30: qty 0.5

## 2024-01-30 MED ORDER — LORAZEPAM 2 MG/ML IJ SOLN: 0.5000 mg | Freq: Four times a day (QID) | INTRAMUSCULAR | Status: DC | PRN

## 2024-01-30 NOTE — Progress Notes (Signed)
 [facility] Progress Note Patient: Alexander [name] XBM:[id number] DOB: [birth year] DOA: 01/11/2024  DOS: the patient was seen and examined on 01/30/2024  Brief Hospital Course: PMH of PE on Coumadin , HFpEF presented to the hospital with BRBPR.  Was found to have colon perforation.  GI, general surgery were consulted.  Underwent ex lap followed by ICU stay.  Developed AKI requiring CRRT. Now out of the ICU but remains volume overloaded and getting HD.  Significant event. /12 admitted for GI bleed and BRBPR.  GI consulted conservative measures.  EGD colonoscopy was recommended. 4/15 repeat CT scan shows evidence of concern for contained perforation in the ascending colon.  General surgery consulted.  Recommending ex lap. 4/16 transferred to ICU. 4/16 cardiorespiratory arrest, achieved ROSC. 4/17 back to the OR, wound VAC placement and small bowel resection. 4/19 Back to the OR s/p exploratory laparotomy, ileostomy creation, small bowel resection, wound VAC in place. nephrology consulted for CRRT 4/23 extubated; off pressors 4/25 Transferred from [facility] ICU to [facility] progressive due to need for hemodialysis. 4/29 triple-lumen CVC placement for TPN. 4/30 CT-guided drain placement.  Dark bloody output.  Anticoagulation on hold.  Also HD catheter placement on right with plan for inpatient HD.  Assessment and Plan: Spontaneous perforation of colon SP right colectomy. Underwent 3 ex lap as above, SP right hemicolectomy and small bowel resection as well as ileostomy creation. Has a wound VAC. Currently has IV TPN. Management per general surgery. 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 Current plan is conservative management with a drain and a controlled fistula. Bowel rest recommended. NG recommended-patient refused.  Remains NPO.  Few ice chips okay better   AKI  Initially required  CRRT. Suspect combination of shock with ATN. Possibility of IV contrast injury also cannot be ruled out. Continues to third space.  Reports of arthralgia. Urine output is actually improving.  Nephrology recommend to hold off on HD. Currently has a temp HD catheter.   Acute blood loss anemia as well as anemia of critical illness Iron  deficiency anemia.  B12 deficiency. Presented with BRBPR.  Hemoglobin at baseline around 10.  Hemoglobin on admission was 7.6.  Hemoglobin at its lowest point was 4.0 on 4/17. Currently hemoglobin is stable around 7 Transfuse hemoglobin less than 7. Currently holding heparin . Last PRBC transfusion was on 4/30. Folate normal, B12 low.  Iron  repleted.  B12 repleted.  Monitor.  ENTEROCOCCUS FAECIUM infection. Drain culture is growing E faecium.  Sensitivity currently pending.  Given clinical improvement for now I will continue with IV Zosyn  although pending sensitivity or if decompensates again may require vancomycin.  Multifactorial shock. Patient has needed vasopressors throughout the hospital stay multiple times. After aggressive IV hydration as well as IV albumin  blood pressure has improved. Monitor.  Septic shock.  Present on admission. Sepsis physiology appears to have resolved for now.  Hypotension. Developed another episode of hypotension on 4/29. Most likely in the setting of pain medication. Zosyn  have been resumed. Improving leukocytosis. Will monitor for improvement. IV albumin  given.  History of PE. 07/2023. Presented with BRBPR.  Anticoagulation has been on hold.  Was resumed with heparin . Given anemia seen again heparin  is on hold. Especially in the setting of drain output being bloody. Remains at risk for recurrent PE.   Acute hypoxic respiratory failure Requiring intubation. Was extubated on 4/23.  Intermittently on oxygen.  Continue incentive spirometry.  Diverticulitis Treated before.  Agitation. Likely hospital induced  delirium. Monitor.  As needed Ativan .  Hypoglycemia. As the patient was not able to get his TPN his blood sugars were low. Treated with D10.  Now better.  Acute right knee pain. Concern for gout. Reports pins-and-needles like pain in his right knee. Suspect arthralgia in the setting of need for HD. X-ray unremarkable. Lower extremity venous Doppler ordered. Now appears with swelling and warmth.  No significant redness so far.  Uric acid is normal.  CRP ESR elevated although it could be multifactorial.  Not a candidate for IV steroids, IV Toradol, other oral NSAIDs or colchicine .  Continue pain control for now.  Hiccups. As needed Thorazine .   Subjective: Reports hiccups.  Frustrated and wants to have some ice chips.  No nausea no vomiting.  Ileostomy output nonbloody.  Drain output thick and bloody.  Physical Exam: General: in Mild distress, No Rash Cardiovascular: S1 and S2 Present, No Murmur Respiratory: Good respiratory effort, Bilateral Air entry present. No Crackles, No wheezes Abdomen: Bowel Sound present, No tenderness Extremities: Trace bilateral edema right more than left.  Right knee warmth as well as right calf area. Neuro: Alert and oriented x3, no new focal deficit  Data Reviewed: I have Reviewed nursing notes, Vitals, and Lab results. Since last encounter, pertinent lab results CBC and BMP   . I have ordered test including CBC and BMP  .   Disposition: Status is: Inpatient Remains inpatient appropriate because: Monitor for renal function and stability of H&H.  SCD's Start: 01/15/24 1400   Family Communication: No one at bedside.  Discussed with son on the phone. Level of care: Progressive   Vitals:   01/30/24 0444 01/30/24 0727 01/30/24 1056 01/30/24 1520  BP:  138/68 (!) 145/73 138/69  Pulse:      Resp:    12  Temp:  98.5 F (36.9 C) 98.8 F (37.1 C) 98.2 F (36.8 C)  TempSrc:  Oral Oral Oral  SpO2:      Weight: 80.3 kg     Height:          Author: Charlean Congress, MD 01/30/2024 6:56 PM  Please look on [email or web address] to find out who is on call.

## 2024-01-30 NOTE — Progress Notes (Signed)
 Upon assessment the RN found 3 open freeze pop pouches on the flood beside the pt. Surgery PA was notified.

## 2024-01-30 NOTE — Plan of Care (Signed)
°  Problem: Health Behavior/Discharge Planning: °Goal: Ability to manage health-related needs will improve °Outcome: Progressing °  °Problem: Clinical Measurements: °Goal: Ability to maintain clinical measurements within normal limits will improve °Outcome: Progressing °  °Problem: Clinical Measurements: °Goal: Diagnostic test results will improve °Outcome: Progressing °  °Problem: Clinical Measurements: °Goal: Respiratory complications will improve °Outcome: Progressing °  °

## 2024-01-30 NOTE — Progress Notes (Signed)
 PHARMACY - TOTAL PARENTERAL NUTRITION CONSULT NOTE   Indication: colon perforation,  intolerance to enteral nutrition  Patient Measurements: Height: 5\' 9"  (175.3 cm) Weight: 80.3 kg (177 lb 0.5 oz) IBW/kg (Calculated) : 70.7 TPN AdjBW (KG): 78.1 Body mass index is 26.14 kg/m.  Assessment: 62yoM admitted 4/12 for rectal bleeding and weakness s/p multiple GI surgeries with findings of colonic perforation requiring resection of R colon, closure on 4/19. Pt with high OG output post-operatively with no meal intake since admission. Prior to admission, patient reports intake of "normal food". Pharmacy to start TPN for prolonged duration without enteral nutrition and prolonged ileus.   TPN discontinued 4/28 with full liquid diet and displaced central line, however resumed 4/30 with new line due to need for bowel rest and findings on 4/29 CT indicating patient is high risk for return to OR for EC fistula or additional perforations. Patient refused replacement of NGT.  Glucose / Insulin : no hx DM, CBG <160, used 4 units SSI/24 hrs  Required ~6u/24h SSI while TPN was running for CBG 130-140 Electrolytes: Na 143 up, CO2 19 down,  Mg 1.9 (none in TPN), Coca 9.6, Phos 6.2 (none in TPN), others wnl Required aggressive Ca replacement earlier in admission Renal: SCr 5.9, BUN 89 - lmay start HD 5/1 CRRT 4/19 >> 4/24. Last HD 4/27 PM with 1L removed.  Hepatic: Aslk phos/AST/ALT wnl, Tbili 1.4, albumin  <1.5 TG 63 on 4/28 Intake / Output; MIVF:  NS in, albumin  12.5g; UOP 1.1 ml/kg/hr, drain 700 mL, stool   GI Imaging: 4/15 CT A/P: wall thickening of R colon with inflammation and suspected enlarging contained perforation 2/2 diverticulitis, colitis or neoplasm. No organized fluid collection or pneumoperitoneum. Interval increased gastric distension consistent with an ileus. 4/16 KUB: gas and distended loops of colon 4/23 KUB: decreased prominence of SB loops 4/25 CT A/P: 5.9 cm fluid collection  inferior to the gallbladder, possibly hepatic abscess 4/29 CT: extravasated oral contrast vs hematoma, large abdominal/pelvic fluid collection concerning for gas and possible fistula vs SB perforation; colonic diverticulosis; gaseous distension of SB indicate possible ileus, anasarca  GI Surgeries / Procedures:  4/16 Right colectomy, drainage of abscess 4/17 Re-exploration abdomen, small bowel resection 4/19 Re-exploration abdomen, small bowel resection, creation of end ileostomy, abd closure and placement of wound vac 4/28 NGT removed  4/30 Abd drain placed -Dark bloody output.   Central access: CVC 4/29, temp HD cath placed 4/30  TPN start date: 4/21-4/28; 4/30 >>  Nutritional Goals: Goal rate of 70 mL/hr will provide 114 g protein and 2099 kcal  RD Assessment: (recalculated 4/24 after CRRT stopped)  Estimated Needs Total Energy Estimated Needs: 2000-2250 kcals Total Protein Estimated Needs: 100-115 grams Total Fluid Estimated Needs: >/= 2L  Current Nutrition:  NPO and TPN  Plan: Resume concentrated TPN to goal 70 mL/hr at 1800, meeting 100% of estimated goal Electrolytes in TPN: decrease Na 125 mEq/L, K 0 mEq/L, Ca 3 mEq/L, Mg 0 mEq/L, and phos 0 mEq/L. Max acetate. Add standard MVI and trace elements to TPN, no chromium on renal replacement Stop Sensitive q6h SSI   Monitor TPN labs on Mon/Thurs, PRN   Thank you for involving pharmacy in this patient's care.  Dorene Gang, PharmD, BCPS, BCCP Clinical Pharmacist  Please check AMION for all [facility] Pharmacy phone numbers After 10:00 PM, call Main Pharmacy [phone]

## 2024-01-30 NOTE — Progress Notes (Signed)
 Physical Therapy Treatment Patient Details Name: Alexander [name] MRN: [id number] DOB: [birth year] Today's Date: 01/30/2024   History of Present Illness Alexander [name] is a 63 y.o. male who .  He was admitted to [facility] on 01/11/2024 with rectal bleeding and hypotension.S/P exploratory laparotomy where he was found to have a perforated ascending colon with intra-abdominal abscess, right colectomy without anastomosis, placement of wound VAC, and drainage of intra-abdominal abscesses. CRRT started 4/19- 01/23/24 .   PMH: PE/DVT on Coumadin , GERD, R ankle arthroplasty    PT Comments  Pt resting in bed on arrival, pleasant and eager for session and OOB mobility. Pt demonstrating great progress towards acute goals this session, however continues to be limited by decreased activity tolerance and LE weakness. Pt able to complete bed mobility with up to mod A with log rolling technique utilized for increased abdominal comfort, with pt verbalizing understanding of education on purpose. Pt progressing transfers this session, able to come to standing from EOB with max HHA x2 and up to mod A +2 (x2 trials) in stedy frame. Pt benefiting from cues for bringing hips anterior toward stedy rail to achieve full upright standing with pt able to maintain ~15 seconds before max fatigue. Pt up in chair at end of session with all needs met. Pt continues to benefit from skilled PT services to progress toward functional mobility goals.      If plan is discharge home, recommend the following: Two people to help with walking and/or transfers;Two people to help with bathing/dressing/bathroom   Can travel by private vehicle        Equipment Recommendations   (TBA)    Recommendations for Other Services       Precautions / Restrictions Precautions Precautions: Fall Recall of Precautions/Restrictions: Impaired Restrictions Weight Bearing Restrictions Per Provider Order: No Other Position/Activity Restrictions: urinary catheter      Mobility  Bed Mobility Overal bed mobility: Needs Assistance Bed Mobility: Rolling, Sidelying to Sit Rolling: Mod assist Sidelying to sit: Min assist       General bed mobility comments: educated pt on log roll technique for abdominal comfort with pt needing mod A to roll to L and min A to come to sitting with pt demonstrating good use of UEs, able to scoot out to EOB with min A, some posterior lean initially with pt able to correct    Transfers Overall transfer level: Needs assistance Equipment used: 2 person hand held assist, Ambulation equipment used Transfers: Sit to/from Stand, Bed to chair/wheelchair/BSC Sit to Stand: Max assist, +2 physical assistance, Mod assist, From elevated surface           General transfer comment: max A +2 to stand initiallt with HHA x2 from slightly elevate EOB, poor knee extension bil with trunk flexed, able to stand from EOB with mod A+2 in stedy frame and improved knee and trunk extension with cues, dependently trasnfered to chair. mod A to boost hips from stedy pads, good eccentric control to sitting Transfer via Lift Equipment: Stedy  Ambulation/Gait               General Gait Details: unable   Stairs             Wheelchair Mobility     Tilt Bed    Modified Rankin (Stroke Patients Only)       Balance  Communication Communication Communication: Impaired Factors Affecting Communication: Reduced clarity of speech  Cognition Arousal: Alert Behavior During Therapy: WFL for tasks assessed/performed   PT - Cognitive impairments: Attention, Initiation, Sequencing, Orientation   Orientation impairments: Time                   PT - Cognition Comments: pt pleasant and particapatory Following commands: Impaired Following commands impaired: Follows one step commands with increased time    Cueing Cueing Techniques: Verbal cues, Tactile cues, Visual  cues  Exercises General Exercises - Lower Extremity Long Arc Quad: AROM, Right, Left, 10 reps, Seated Hip Flexion/Marching: AROM, Right, Left, 10 reps, Seated    General Comments General comments (skin integrity, edema, etc.): VSS on RA      Pertinent Vitals/Pain Pain Assessment Pain Assessment: Faces Faces Pain Scale: Hurts a little bit Pain Location: abdomen Pain Descriptors / Indicators: Other (Comment) (pinching) Pain Intervention(s): Premedicated before session, Monitored during session, Limited activity within patient's tolerance, Repositioned    Home Living                          Prior Function            PT Goals (current goals can now be found in the care plan section) Acute Rehab PT Goals Patient Stated Goal: to get up to chair PT Goal Formulation: With patient Time For Goal Achievement: 02/06/24 Progress towards PT goals: Progressing toward goals    Frequency    Min 2X/week      PT Plan      Co-evaluation              AM-PAC PT "6 Clicks" Mobility   Outcome Measure  Help needed turning from your back to your side while in a flat bed without using bedrails?: Total Help needed moving from lying on your back to sitting on the side of a flat bed without using bedrails?: Total Help needed moving to and from a bed to a chair (including a wheelchair)?: Total Help needed standing up from a chair using your arms (e.g., wheelchair or bedside chair)?: Total Help needed to walk in hospital room?: Total Help needed climbing 3-5 steps with a railing? : Total 6 Click Score: 6    End of Session   Activity Tolerance: Patient tolerated treatment well Patient left: in chair;with call bell/phone within reach;with chair alarm set;Other (comment);with nursing/sitter in room (with leg rests elevated, door open and NT present) Nurse Communication: Mobility status;Need for lift equipment (stedy +2 for back to bed) PT Visit Diagnosis: Unsteadiness on feet  (R26.81);Muscle weakness (generalized) (M62.81);Other symptoms and signs involving the nervous system (R29.898);Difficulty in walking, not elsewhere classified (R26.2)     Time: 0981-1914 PT Time Calculation (min) (ACUTE ONLY): 28 min  Charges:    $Therapeutic Activity: 23-37 mins PT General Charges $$ ACUTE PT VISIT: 1 Visit                     Adien Kimmel R. PTA Acute Rehabilitation Services Office: [phone]   Agapito Horseman 01/30/2024, 11:52 AM

## 2024-01-30 NOTE — Progress Notes (Signed)
 Referring Physician(s): Marlin Simmonds, PA-C  Supervising Physician: Myrlene Asper  Patient Status:  [facility] - In-pt  Chief Complaint:  S/p intra-abd fluid collection drain placement 4/30 by Dr. Jinx Mourning  Brief History of Present Illness: Alexander [name] is a 63 y.o. male with medical history significant of Pe/DVT on coumadin , diastolic CHF.  This patient was admitted 01/11/2024 for GI bleeding.   4/16- He underwent ex lap where he was found to have perforated ascending colon with intra abd abscess. Right colectomy without anastomosis, placement of wound VAC, and drainage of intra-abdominal abscesses. Returned to ICU on vent with cardiorespiratory arrest same day.  ICU care included blood products and pressors. 4/19- he returned to the OR for ex lap with ileostomy creation, small bowel resection, wound VAC. Was started on CRRT. 4/23- extubated, off pressors 4/25- transferred to [facility] for HD 4/26- IR consulted for 4/25 CT showing fluid collection adjacent to drain, IR recommended monitoring status and re-imaging if not improved 4/29- Noted lethargy and confusion overnight with persistent leukocytosis. CCS consulted, zosyn  restarted and repeat CT ordered. R IJ CV and right femoral HD removed.  CT resulted today with new 10.9 x 15.8 x 13.1 cm intra-abd fluid collection that appears to contain enteric contrast and ingested material. IR consulted for fluid collection drain and triple lumen CVC placement. Patient allowed line placement by Dr. Mabel Savage but refused drain placement 4/30- After speaking with son, patient agreed to drain placement and HD cath which was completed by Dr. Jinx Mourning with dark, bloody output at that time. 5/1- Overnight, copious output from drain (960cc), no longer tachycardic (80s), WBC tending down (19.7>15.3> 12.6 today)  Subjective: Patient is much more alert than previous exam, now belligerent. Initially refuses to allow drain to be evaluated without providing beverage. Patient  appears to have difficulty understanding reasoning behind NPO status. Declines to answer NPO questions.   Allergies: Patient has no known allergies.  Medications: Prior to Admission medications   Medication Sig Start Date End Date Taking? Authorizing Provider  acetaminophen  (TYLENOL ) 325 MG tablet Take 2 tablets (650 mg total) by mouth every 6 (six) hours as needed for mild pain (pain score 1-3) (or Fever >/= 101). 07/23/23  Yes Sheikh, Omair Latif, DO  warfarin (COUMADIN ) 5 MG tablet TAKE 2 TABLETS BY MOUTH DAILY OR AS DIRECTED BY ANTICOAGULATION CLINIC Patient taking differently: Take 10 mg by mouth daily at 4 PM. 12/24/23  Yes Alexander Iba, PA  mupirocin  ointment (BACTROBAN ) 2 % Apply to affected area 1-2 times daily Patient not taking: Reported on 01/11/2024 12/03/23   Worley, Samantha, PA  polyethylene glycol powder (GLYCOLAX /MIRALAX ) 17 GM/SCOOP powder Mix as directed and take 17 g by mouth daily. Patient not taking: Reported on 01/11/2024 07/23/23   Eveline Hipps, DO     Vital Signs: BP 138/68 (BP Location: Left Arm)   Pulse 99   Temp 98.5 F (36.9 C) (Oral)   Resp (!) 23   Ht 5\' 9"  (1.753 m)   Wt 177 lb 0.5 oz (80.3 kg)   SpO2 97%   BMI 26.14 kg/m   Physical Exam Cardiovascular:     Rate and Rhythm: Normal rate.     Pulses: Normal pulses.     Heart sounds: Normal heart sounds.  Pulmonary:     Effort: Pulmonary effort is normal.  Abdominal:     General: There is distension.     Palpations: Abdomen is soft.     Tenderness: There is abdominal tenderness.  Comments: Midline incision dressing C/D/I. Surgical drain present left abd with scan output in bulb. R IR drain insertion site unremarkable, suture intact, bulb charged appropriately, bloody output in bulb.   Skin:    General: Skin is warm and dry.  Neurological:     Mental Status: He is alert. He is disoriented.  Psychiatric:        Mood and Affect: Mood normal.        Behavior: Behavior normal.         Thought Content: Thought content normal.        Judgment: Judgment normal.     Imaging: IR Fluoro Guide CV Line Right Result Date: 01/29/2024 INDICATION: 63 year old male with history of acute kidney injury requiring central venous access for hemodialysis. EXAM: NON-TUNNELED CENTRAL VENOUS HEMODIALYSIS CATHETER PLACEMENT WITH ULTRASOUND AND FLUOROSCOPIC GUIDANCE COMPARISON:  None Available. MEDICATIONS: None FLUOROSCOPY TIME:  One mGy COMPLICATIONS: None immediate. PROCEDURE: Informed written consent was obtained from the patient after a discussion of the risks, benefits, and alternatives to treatment. Questions regarding the procedure were encouraged and answered. The right neck and chest were prepped with chlorhexidine  in a sterile fashion, and a sterile drape was applied covering the operative field. Maximum barrier sterile technique with sterile gowns and gloves were used for the procedure. A timeout was performed prior to the initiation of the procedure. After the overlying soft tissues were anesthetized, a small venotomy incision was created and a micropuncture kit was utilized to access the internal jugular vein. Real-time ultrasound guidance was utilized for vascular access including the acquisition of a permanent ultrasound image documenting patency of the accessed vessel. The microwire was utilized to measure appropriate catheter length. A stiff glidewire was advanced to the level of the IVC. Under fluoroscopic guidance, the venotomy was serially dilated, ultimately allowing placement of a 16 cm temporary Trialysis catheter with tip ultimately terminating within the superior aspect of the right atrium. Final catheter positioning was confirmed and documented with a spot radiographic image. The catheter aspirates and flushes normally. The catheter was flushed with appropriate volume heparin  dwells. The catheter exit site was secured with a 0 silk retention suture. A dressing was placed. The patient  tolerated the procedure well without immediate post procedural complication. IMPRESSION: Successful placement of a right internal jugular approach 16 cm temporary dialysis catheter with tip terminating with in the superior aspect of the right atrium. The catheter is ready for immediate use. PLAN: This catheter may be converted to a tunneled dialysis catheter at a later date as indicated. Creasie Doctor, MD [facility] Radiology Electronically Signed   By: Creasie Doctor M.D.   On: 01/29/2024 16:41   IR US  Guide Vasc Access Right Result Date: 01/29/2024 INDICATION: 63 year old male with history of acute kidney injury requiring central venous access for hemodialysis. EXAM: NON-TUNNELED CENTRAL VENOUS HEMODIALYSIS CATHETER PLACEMENT WITH ULTRASOUND AND FLUOROSCOPIC GUIDANCE COMPARISON:  None Available. MEDICATIONS: None FLUOROSCOPY TIME:  One mGy COMPLICATIONS: None immediate. PROCEDURE: Informed written consent was obtained from the patient after a discussion of the risks, benefits, and alternatives to treatment. Questions regarding the procedure were encouraged and answered. The right neck and chest were prepped with chlorhexidine  in a sterile fashion, and a sterile drape was applied covering the operative field. Maximum barrier sterile technique with sterile gowns and gloves were used for the procedure. A timeout was performed prior to the initiation of the procedure. After the overlying soft tissues were anesthetized, a small venotomy incision was  created and a micropuncture kit was utilized to access the internal jugular vein. Real-time ultrasound guidance was utilized for vascular access including the acquisition of a permanent ultrasound image documenting patency of the accessed vessel. The microwire was utilized to measure appropriate catheter length. A stiff glidewire was advanced to the level of the IVC. Under fluoroscopic guidance, the venotomy was serially  dilated, ultimately allowing placement of a 16 cm temporary Trialysis catheter with tip ultimately terminating within the superior aspect of the right atrium. Final catheter positioning was confirmed and documented with a spot radiographic image. The catheter aspirates and flushes normally. The catheter was flushed with appropriate volume heparin  dwells. The catheter exit site was secured with a 0 silk retention suture. A dressing was placed. The patient tolerated the procedure well without immediate post procedural complication. IMPRESSION: Successful placement of a right internal jugular approach 16 cm temporary dialysis catheter with tip terminating with in the superior aspect of the right atrium. The catheter is ready for immediate use. PLAN: This catheter may be converted to a tunneled dialysis catheter at a later date as indicated. Creasie Doctor, MD [facility] Radiology Electronically Signed   By: Creasie Doctor M.D.   On: 01/29/2024 16:41   CT GUIDED PERITONEAL/RETROPERITONEAL FLUID DRAIN BY PERC CATH Result Date: 01/29/2024 INDICATION: 63 year old male with postoperative intra-abdominal fluid collection presenting for drainage. EXAM: CT PERC DRAIN PERITONEAL ABCESS COMPARISON:  CT abdomen pelvis from 01/28/2024 MEDICATIONS: The patient is currently admitted to the hospital and receiving intravenous antibiotics. The antibiotics were administered within an appropriate time frame prior to the initiation of the procedure. ANESTHESIA/SEDATION: Moderate (conscious) sedation was employed during this procedure. A total of Versed  1 2 mg and Fentanyl  75 mcg was administered intravenously. Moderate Sedation Time: 18 minutes. The patient's level of consciousness and vital signs were monitored continuously by radiology nursing throughout the procedure under my direct supervision. CONTRAST:  None COMPLICATIONS: None immediate. PROCEDURE: RADIATION DOSE REDUCTION: This  exam was performed according to the departmental dose-optimization program which includes automated exposure control, adjustment of the mA and/or kV according to patient size and/or use of iterative reconstruction technique. Informed written consent was obtained from the patient after a discussion of the risks, benefits and alternatives to treatment. The patient was placed supine on the CT gantry and a pre procedural CT was performed re-demonstrating the known abscess/fluid collection within the midline lower abdomen. The procedure was planned. A timeout was performed prior to the initiation of the procedure. The right midline lower abdomen was prepped and draped in the usual sterile fashion. The overlying soft tissues were anesthetized with 1% lidocaine  with epinephrine . Appropriate trajectory was planned with the use of a 22 gauge spinal needle. An 18 gauge trocar needle was advanced into the abscess/fluid collection and a short Amplatz super stiff wire was coiled within the collection. Appropriate positioning was confirmed with a limited CT scan. The tract was serially dilated allowing placement of a 10 [place] all-purpose drainage catheter. Appropriate positioning was confirmed with a limited postprocedural CT scan. A proximally 100 ml of sanguinous fluid was aspirated. The tube was connected to a bulb suction and sutured in place. A dressing was placed. The patient tolerated the procedure well without immediate post procedural complication. IMPRESSION: Successful CT guided placement of a 10 French all purpose drain catheter into the midline abdominal fluid collection with aspiration of approximately 100 mL of sanguinous fluid. Samples were sent to the laboratory as requested by the ordering  clinical team. Creasie Doctor, MD [facility] Radiology Electronically Signed   By: Creasie Doctor M.D.   On: 01/29/2024 16:39   DG Knee Right Port Result Date:  01/29/2024 CLINICAL DATA:  Right knee pain. EXAM: PORTABLE RIGHT KNEE - 1-2 VIEW COMPARISON:  Right knee radiographs dated 10/24/2012. FINDINGS: No evidence of acute fracture, dislocation, or joint effusion. Mild medial compartment joint space narrowing. Vascular calcifications are noted. IMPRESSION: 1. No acute osseous abnormality. 2. Mild medial compartment joint space narrowing. Electronically Signed   By: Mannie Seek M.D.   On: 01/29/2024 12:09   IR Fluoro Guide CV Line Right Result Date: 01/28/2024 INDICATION: 63 year old male referred for non tunneled central venous catheter EXAM: IMAGE GUIDED NON TUNNELED CENTRAL VENOUS CATHETER MEDICATIONS: None ANESTHESIA/SEDATION: Moderate (conscious) sedation was not employed during this procedure. FLUOROSCOPY: Radiation Exposure Index (as provided by the fluoroscopic device): 10.1 mGy Kerma COMPLICATIONS: None PROCEDURE: Informed written consent was obtained from the patient's family after a discussion of the risks, benefits, and alternatives to treatment. Questions regarding the procedure were encouraged and answered. The right neck was prepped with chlorhexidine  in a sterile fashion, and a sterile drape was applied covering the operative field. Maximum barrier sterile technique with sterile gowns and gloves were used for the procedure. A timeout was performed prior to the initiation of the procedure. A micropuncture kit was utilized to access the right internal jugular vein under direct, real-time ultrasound guidance after the overlying soft tissues were anesthetized with 1% lidocaine  with epinephrine . Ultrasound image documentation was performed. Wire was advanced to the level of the IVC. A triple-lumen standard 30 cm central venous catheter was advanced on the wire, with the tip position at the level of the right atrium. Final catheter positioning was confirmed and documented with a spot radiographic image. The catheter aspirates and flushes normally. The  catheter was flushed with appropriate volume heparin  dwells. Dressings were applied. The patient tolerated the procedure well without immediate post procedural complication. IMPRESSION: Status post image guided right IJ non tunneled central venous catheter. Signed, Marciano Settles. Rexine Cater, RPVI [facility] Radiology Electronically Signed   By: Myrlene Asper D.O.   On: 01/28/2024 16:15   CT ABDOMEN PELVIS WO CONTRAST Result Date: 01/28/2024 CLINICAL DATA:  Abdominal pain, post-op eval for infection, post op complication EXAM: CT ABDOMEN AND PELVIS WITHOUT CONTRAST TECHNIQUE: Multidetector CT imaging of the abdomen and pelvis was performed following the standard protocol without IV contrast. RADIATION DOSE REDUCTION: This exam was performed according to the departmental dose-optimization program which includes automated exposure control, adjustment of the mA and/or kV according to patient size and/or use of iterative reconstruction technique. COMPARISON:  January 24, 2024, January 14, 2024, January 11, 2024 FINDINGS: Of note, the lack of intravenous contrast limits evaluation of the solid organ parenchyma and vascularity. The study is degraded by patient's arm positioning. Lower chest: Patchy ground-glass airspace opacities in the visualized lungs. Small bilateral pleural effusions with bibasilar compressive atelectasis, larger on the right than the left. Hepatobiliary: No mass. No radiopaque stones or wall thickening of the gallbladder. No intrahepatic or extrahepatic biliary ductal dilation. Pancreas: No mass or main ductal dilation. No peripancreatic inflammation or fluid collection. Spleen: Normal size. No mass. Adrenals/Urinary Tract: No adrenal masses. No renal mass. No hydronephrosis or nephrolithiasis. Mostly decompressed urinary bladder with urinary catheter in place. Stomach/Bowel: The stomach contains ingested material without focal abnormality. Mild diffuse  wall thickening throughout the small  bowel. Gaseous distension of a short segment of small bowel measuring 4 cm in the upper abdomen. Right lower quadrant diverting ileostomy with an extended right hemicolectomy. Descending and sigmoid colonic diverticulosis. Vascular/Lymphatic: No aortic aneurysm. Scattered aortoiliac atherosclerosis. No intraabdominal or pelvic lymphadenopathy. Reproductive: No prostatomegaly. No free pelvic fluid. Other: There is a large, thick-walled fluid collection in the lower abdomen and pelvis, measuring 10.9 x 15.8 x 13.1 cm. Hyperdense layering material present within this fluid collection, which may represent ingested material or hemorrhage. Multiple locules of gas noted along the ventral incision (axial 55-70), extending into the midline incision subcutaneous fat, worrisome for extraluminal gas with possible fistulous communication. Diffuse mesenteric edema with layering ascites in the left paracolic gutter. There is also loculated fluid in the right paracolic gutter. Postsurgical drainage catheter extending from the left lower quadrant across the upper abdomen and terminating in the right paracolic gutter. Musculoskeletal: No acute fracture or destructive lesion. Multilevel degenerative disc disease of the spine. 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 Diffuse anasarca. IMPRESSION: 1. Large, thick-walled fluid collection in the lower abdomen and pelvis, measuring 10.9 x 15.8 x 13.1 cm ([id number]). Hyperdense material in this collection likely reflects a combination of ingested material and enteric contrast from the prior CT rather than or hemorrhage. Given these findings, this is worrisome for underlying small-bowel perforation. 2. Moderate wall thickening throughout the majority of the small bowel, likely reactive to the intra-abdominal inflammation. Additionally, gaseous distension of a couple of  segments of small bowel in the upper abdomen, possibly reflect changes of a reactive ileus. 3. Increasing locules of gas subjacent to the midline ventral incision (axial 55-70), extending the peritoneum to the subcutaneous fat, worrisome for extraluminal gas with possible fistulous communication. 4. Patchy ground-glass airspace opacities in the visualized lungs, which may represent underlying viral pneumonia or early pulmonary edema. Small bilateral pleural effusions, larger on the right than the left. 5. Diffuse anasarca. Critical Value/emergent results were called by telephone at the time of interpretation on 01/28/2024 at 11:21 am to provider [facility] , who verbally acknowledged these results. Electronically Signed   By: Rance Burrows M.D.   On: 01/28/2024 11:30    Labs:  CBC: Recent Labs    01/29/24 0449 01/29/24 1623 01/29/24 2118 01/30/24 0445  WBC 24.3* 19.7* 15.3* 12.6*  HGB 7.5* 7.3* 6.6* 7.5*  HCT 22.9* 21.9* 20.4* 22.3*  PLT 491* 501* 406* 386    COAGS: Recent Labs    01/20/24 0427 01/21/24 0504 01/22/24 0417 01/23/24 0505 01/24/24 0552 01/26/24 0610 01/27/24 0045  INR 1.0 1.0  --   --  1.1 1.2 1.2  APTT 33 34  34 33 46*  --   --   --     BMP: Recent Labs    01/27/24 0045 01/28/24 1653 01/29/24 0449 01/30/24 0445  NA 139 140 140 143  K 3.3* 5.3* 5.3* 4.1  CL 103 104 105 107  CO2 25 21* 20* 19*  GLUCOSE 108* 81 90 153*  BUN 55* 81* 87* 89*  CALCIUM  7.8* 7.9* 7.8* 7.6*  CREATININE 3.91* 5.79* 6.01* 5.91*  GFRNONAA 17* 10* 10* 10*    LIVER FUNCTION TESTS: Recent Labs    01/23/24 0505 01/24/24 0552 01/25/24 0540 01/26/24 0610 01/27/24 0045 01/28/24 1653 01/30/24 0445  BILITOT 2.2* 1.8*  --   --   --  1.5* 1.4*  AST 57* 41  --   --   --  33 39  ALT 179* 118*  --   --   --  49* 44  ALKPHOS 69 63  --   --   --  65 56  PROT 5.4* 5.4*  --   --   --  5.8* 5.9*  ALBUMIN  1.7* 1.5*  1.6*   < > <1.5* <1.5* <1.5* <1.5*   < > = values in this  interval not displayed.    Assessment and Plan:  Drain Location: RLQ Size: Fr size: 10 Fr Date of placement: 01/29/24  Currently to: Drain collection device: suction bulb 24 hour output:  Output by Drain (mL) 01/28/24 0701 - 01/28/24 1900 01/28/24 1901 - 01/29/24 0700 01/29/24 0701 - 01/29/24 1900 01/29/24 1901 - 01/30/24 0700 01/30/24 0701 - 01/30/24 1033  Closed System Drain LLQ Bulb (JP) 19 Fr. 20      Closed System Drain 2 Midline Abdomen Bulb (JP) 10 Fr.   250 710      Current examination: Drain has not yet been flushed/asp d/t large volume output.  Insertion site unremarkable. Suture and stat lock in place. Dressed appropriately.  output overnight, Hgb 7.5, will continue to monitor given large volume sanguinous output, large volume in line with size of collection 10.9 x 15.8 x 13.1 cm  WBC improving (12.6 today<15.3<19.7) VSS, afebrile Continues to be altered.   Plan: Continue TID flushes with 5 cc NS. Record output Q shift. Dressing changes QD or PRN if soiled.  Call IR APP or on call IR MD if difficulty flushing or sudden change in drain output.  Repeat imaging/possible drain injection once output < 10 mL/QD (excluding flush material). Consideration for drain removal if output is < 10 mL/QD (excluding flush material), pending discussion with the providing surgical service.   IR will continue to follow - please call with questions or concerns.     Electronically Signed: Terressa Fess, NP 01/30/2024, 10:07 AM   I spent a total of 25 Minutes at the the patient's bedside AND on the patient's hospital floor or unit, greater than 50% of which was counseling/coordinating care for follow up s/p intra-abd fluid collection drain.

## 2024-01-30 NOTE — Progress Notes (Signed)
 12 Days Post-Op  Subjective: Feeling much better today and is wanting to eat and drink again.  Has never gotten his NGT.  Wants to get out of bed to mobilize and get in a chair due to "the swelling in my legs"  Objective: Vital signs in last 24 hours: Temp:  [97.7 F (36.5 C)-99.1 F (37.3 C)] 98.5 F (36.9 C) (05/01 0727) Pulse Rate:  [88-104] 99 (05/01 0228) Resp:  [11-23] 23 (05/01 0416) BP: (123-158)/(61-74) 138/68 (05/01 0727) SpO2:  [94 %-100 %] 97 % (05/01 0200) Weight:  [80.3 kg] 80.3 kg (05/01 0444) Last BM Date : 01/30/24  Intake/Output from previous day: 04/30 0701 - 05/01 0700 In: 3482 [I.V.:2682.4; Blood:629; IV Piggyback:150.6] Out: 5060 [Urine:2500; Drains:960; Stool:1600] Intake/Output this shift: Total I/O In: -  Out: 350 [Urine:350]  PE: Gen: awake, alert Abd: Soft, appropriately tender. Ileostomy viable with thin dark output in bag. Midline wound with false bottom.  This was opened up with tan/brown drainage noted from inferior to his fascial sutures in the superior aspect of his wound.  Fascia remains intact though as of now, but is becoming necrotic appearing. JP in place, 20cc of output - ss.  New IR drain with 960cc of output since placement.  This is just bloody at the moment.  No succus right now Lab Results:  Recent Labs    01/29/24 2118 01/30/24 0445  WBC 15.3* 12.6*  HGB 6.6* 7.5*  HCT 20.4* 22.3*  PLT 406* 386   BMET Recent Labs    01/29/24 0449 01/30/24 0445  NA 140 143  K 5.3* 4.1  CL 105 107  CO2 20* 19*  GLUCOSE 90 153*  BUN 87* 89*  CREATININE 6.01* 5.91*  CALCIUM  7.8* 7.6*   PT/INR No results for input(s): "LABPROT", "INR" in the last 72 hours.  CMP     Component Value Date/Time   NA 143 01/30/2024 0445   NA 141 01/22/2020 1137   K 4.1 01/30/2024 0445   CL 107 01/30/2024 0445   CO2 19 (L) 01/30/2024 0445   GLUCOSE 153 (H) 01/30/2024 0445   BUN 89 (H) 01/30/2024 0445   BUN 13 01/22/2020 1137   CREATININE 5.91  (H) 01/30/2024 0445   CREATININE 0.98 06/14/2017 0935   CALCIUM  7.6 (L) 01/30/2024 0445   PROT 5.9 (L) 01/30/2024 0445   PROT 7.3 01/22/2020 1137   ALBUMIN  <1.5 (L) 01/30/2024 0445   ALBUMIN  4.2 01/22/2020 1137   AST 39 01/30/2024 0445   ALT 44 01/30/2024 0445   ALKPHOS 56 01/30/2024 0445   BILITOT 1.4 (H) 01/30/2024 0445   BILITOT 0.4 01/22/2020 1137   GFRNONAA 10 (L) 01/30/2024 0445   GFRNONAA >89 09/17/2014 1253   GFRAA 111 01/22/2020 1137   GFRAA >89 09/17/2014 1253   Lipase     Component Value Date/Time   LIPASE 22 09/28/2019 1109       Studies/Results: IR Fluoro Guide CV Line Right Result Date: 01/29/2024 INDICATION: 63 year old male with history of acute kidney injury requiring central venous access for hemodialysis. EXAM: NON-TUNNELED CENTRAL VENOUS HEMODIALYSIS CATHETER PLACEMENT WITH ULTRASOUND AND FLUOROSCOPIC GUIDANCE COMPARISON:  None Available. MEDICATIONS: None FLUOROSCOPY TIME:  One mGy COMPLICATIONS: None immediate. PROCEDURE: Informed written consent was obtained from the patient after a discussion of the risks, benefits, and alternatives to treatment. Questions regarding the procedure were encouraged and answered. The right neck and chest were prepped with chlorhexidine  in a sterile fashion, and a sterile drape was applied covering the  operative field. Maximum barrier sterile technique with sterile gowns and gloves were used for the procedure. A timeout was performed prior to the initiation of the procedure. After the overlying soft tissues were anesthetized, a small venotomy incision was created and a micropuncture kit was utilized to access the internal jugular vein. Real-time ultrasound guidance was utilized for vascular access including the acquisition of a permanent ultrasound image documenting patency of the accessed vessel. The microwire was utilized to measure appropriate catheter length. A stiff glidewire was advanced to the level of the IVC. Under fluoroscopic  guidance, the venotomy was serially dilated, ultimately allowing placement of a 16 cm temporary Trialysis catheter with tip ultimately terminating within the superior aspect of the right atrium. Final catheter positioning was confirmed and documented with a spot radiographic image. The catheter aspirates and flushes normally. The catheter was flushed with appropriate volume heparin  dwells. The catheter exit site was secured with a 0 silk retention suture. A dressing was placed. The patient tolerated the procedure well without immediate post procedural complication. IMPRESSION: Successful placement of a right internal jugular approach 16 cm temporary dialysis catheter with tip terminating with in the superior aspect of the right atrium. The catheter is ready for immediate use. PLAN: This catheter may be converted to a tunneled dialysis catheter at a later date as indicated. Creasie Doctor, MD [facility] Radiology Electronically Signed   By: Creasie Doctor M.D.   On: 01/29/2024 16:41   IR US  Guide Vasc Access Right Result Date: 01/29/2024 INDICATION: 63 year old male with history of acute kidney injury requiring central venous access for hemodialysis. EXAM: NON-TUNNELED CENTRAL VENOUS HEMODIALYSIS CATHETER PLACEMENT WITH ULTRASOUND AND FLUOROSCOPIC GUIDANCE COMPARISON:  None Available. MEDICATIONS: None FLUOROSCOPY TIME:  One mGy COMPLICATIONS: None immediate. PROCEDURE: Informed written consent was obtained from the patient after a discussion of the risks, benefits, and alternatives to treatment. Questions regarding the procedure were encouraged and answered. The right neck and chest were prepped with chlorhexidine  in a sterile fashion, and a sterile drape was applied covering the operative field. Maximum barrier sterile technique with sterile gowns and gloves were used for the procedure. A timeout was performed prior to the initiation of the procedure. After the  overlying soft tissues were anesthetized, a small venotomy incision was created and a micropuncture kit was utilized to access the internal jugular vein. Real-time ultrasound guidance was utilized for vascular access including the acquisition of a permanent ultrasound image documenting patency of the accessed vessel. The microwire was utilized to measure appropriate catheter length. A stiff glidewire was advanced to the level of the IVC. Under fluoroscopic guidance, the venotomy was serially dilated, ultimately allowing placement of a 16 cm temporary Trialysis catheter with tip ultimately terminating within the superior aspect of the right atrium. Final catheter positioning was confirmed and documented with a spot radiographic image. The catheter aspirates and flushes normally. The catheter was flushed with appropriate volume heparin  dwells. The catheter exit site was secured with a 0 silk retention suture. A dressing was placed. The patient tolerated the procedure well without immediate post procedural complication. IMPRESSION: Successful placement of a right internal jugular approach 16 cm temporary dialysis catheter with tip terminating with in the superior aspect of the right atrium. The catheter is ready for immediate use. PLAN: This catheter may be converted to a tunneled dialysis catheter at a later date as indicated. Creasie Doctor, MD [facility] Electronically Signed   By: Edelmira Goodness  Suttle M.D.   On: 01/29/2024 16:41   CT GUIDED PERITONEAL/RETROPERITONEAL FLUID DRAIN BY PERC CATH Result Date: 01/29/2024 INDICATION: 63 year old male with postoperative intra-abdominal fluid collection presenting for drainage. EXAM: CT PERC DRAIN PERITONEAL ABCESS COMPARISON:  CT abdomen pelvis from 01/28/2024 MEDICATIONS: The patient is currently admitted to the hospital and receiving intravenous antibiotics. The antibiotics were administered within an appropriate time  frame prior to the initiation of the procedure. ANESTHESIA/SEDATION: Moderate (conscious) sedation was employed during this procedure. A total of Versed  1 2 mg and Fentanyl  75 mcg was administered intravenously. Moderate Sedation Time: 18 minutes. The patient's level of consciousness and vital signs were monitored continuously by radiology nursing throughout the procedure under my direct supervision. CONTRAST:  None COMPLICATIONS: None immediate. PROCEDURE: RADIATION DOSE REDUCTION: This exam was performed according to the departmental dose-optimization program which includes automated exposure control, adjustment of the mA and/or kV according to patient size and/or use of iterative reconstruction technique. Informed written consent was obtained from the patient after a discussion of the risks, benefits and alternatives to treatment. The patient was placed supine on the CT gantry and a pre procedural CT was performed re-demonstrating the known abscess/fluid collection within the midline lower abdomen. The procedure was planned. A timeout was performed prior to the initiation of the procedure. The right midline lower abdomen was prepped and draped in the usual sterile fashion. The overlying soft tissues were anesthetized with 1% lidocaine  with epinephrine . Appropriate trajectory was planned with the use of a 22 gauge spinal needle. An 18 gauge trocar needle was advanced into the abscess/fluid collection and a short Amplatz super stiff wire was coiled within the collection. Appropriate positioning was confirmed with a limited CT scan. The tract was serially dilated allowing placement of a 10 [place] all-purpose drainage catheter. Appropriate positioning was confirmed with a limited postprocedural CT scan. A proximally 100 ml of sanguinous fluid was aspirated. The tube was connected to a bulb suction and sutured in place. A dressing was placed. The patient tolerated the procedure well without immediate post procedural  complication. IMPRESSION: Successful CT guided placement of a 10 French all purpose drain catheter into the midline abdominal fluid collection with aspiration of approximately 100 mL of sanguinous fluid. Samples were sent to the laboratory as requested by the ordering clinical team. Creasie Doctor, MD [facility] Radiology Electronically Signed   By: Creasie Doctor M.D.   On: 01/29/2024 16:39   DG Knee Right Port Result Date: 01/29/2024 CLINICAL DATA:  Right knee pain. EXAM: PORTABLE RIGHT KNEE - 1-2 VIEW COMPARISON:  Right knee radiographs dated 10/24/2012. FINDINGS: No evidence of acute fracture, dislocation, or joint effusion. Mild medial compartment joint space narrowing. Vascular calcifications are noted. IMPRESSION: 1. No acute osseous abnormality. 2. Mild medial compartment joint space narrowing. Electronically Signed   By: Mannie Seek M.D.   On: 01/29/2024 12:09   IR Fluoro Guide CV Line Right Result Date: 01/28/2024 INDICATION: 63 year old male referred for non tunneled central venous catheter EXAM: IMAGE GUIDED NON TUNNELED CENTRAL VENOUS CATHETER MEDICATIONS: None ANESTHESIA/SEDATION: Moderate (conscious) sedation was not employed during this procedure. FLUOROSCOPY: Radiation Exposure Index (as provided by the fluoroscopic device): 10.1 mGy Kerma COMPLICATIONS: None PROCEDURE: Informed written consent was obtained from the patient's family after a discussion of the risks, benefits, and alternatives to treatment. Questions regarding the procedure were encouraged and answered. The right neck was prepped with chlorhexidine  in a sterile fashion, and a sterile drape was applied covering the  operative field. Maximum barrier sterile technique with sterile gowns and gloves were used for the procedure. A timeout was performed prior to the initiation of the procedure. A micropuncture kit was utilized to access the right internal jugular vein under direct,  real-time ultrasound guidance after the overlying soft tissues were anesthetized with 1% lidocaine  with epinephrine . Ultrasound image documentation was performed. Wire was advanced to the level of the IVC. A triple-lumen standard 30 cm central venous catheter was advanced on the wire, with the tip position at the level of the right atrium. Final catheter positioning was confirmed and documented with a spot radiographic image. The catheter aspirates and flushes normally. The catheter was flushed with appropriate volume heparin  dwells. Dressings were applied. The patient tolerated the procedure well without immediate post procedural complication. IMPRESSION: Status post image guided right IJ non tunneled central venous catheter. Signed, Marciano Settles. Rexine Cater, RPVI [facility] Radiology Electronically Signed   By: Myrlene Asper D.O.   On: 01/28/2024 16:15   CT ABDOMEN PELVIS WO CONTRAST Result Date: 01/28/2024 CLINICAL DATA:  Abdominal pain, post-op eval for infection, post op complication EXAM: CT ABDOMEN AND PELVIS WITHOUT CONTRAST TECHNIQUE: Multidetector CT imaging of the abdomen and pelvis was performed following the standard protocol without IV contrast. RADIATION DOSE REDUCTION: This exam was performed according to the departmental dose-optimization program which includes automated exposure control, adjustment of the mA and/or kV according to patient size and/or use of iterative reconstruction technique. COMPARISON:  January 24, 2024, January 14, 2024, January 11, 2024 FINDINGS: Of note, the lack of intravenous contrast limits evaluation of the solid organ parenchyma and vascularity. The study is degraded by patient's arm positioning. Lower chest: Patchy ground-glass airspace opacities in the visualized lungs. Small bilateral pleural effusions with bibasilar compressive atelectasis, larger on the right than the left. Hepatobiliary: No mass. No radiopaque stones  or wall thickening of the gallbladder. No intrahepatic or extrahepatic biliary ductal dilation. Pancreas: No mass or main ductal dilation. No peripancreatic inflammation or fluid collection. Spleen: Normal size. No mass. Adrenals/Urinary Tract: No adrenal masses. No renal mass. No hydronephrosis or nephrolithiasis. Mostly decompressed urinary bladder with urinary catheter in place. Stomach/Bowel: The stomach contains ingested material without focal abnormality. Mild diffuse wall thickening throughout the small bowel. Gaseous distension of a short segment of small bowel measuring 4 cm in the upper abdomen. Right lower quadrant diverting ileostomy with an extended right hemicolectomy. Descending and sigmoid colonic diverticulosis. Vascular/Lymphatic: No aortic aneurysm. Scattered aortoiliac atherosclerosis. No intraabdominal or pelvic lymphadenopathy. Reproductive: No prostatomegaly. No free pelvic fluid. Other: There is a large, thick-walled fluid collection in the lower abdomen and pelvis, measuring 10.9 x 15.8 x 13.1 cm. Hyperdense layering material present within this fluid collection, which may represent ingested material or hemorrhage. Multiple locules of gas noted along the ventral incision (axial 55-70), extending into the midline incision subcutaneous fat, worrisome for extraluminal gas with possible fistulous communication. Diffuse mesenteric edema with layering ascites in the left paracolic gutter. There is also loculated fluid in the right paracolic gutter. Postsurgical drainage catheter extending from the left lower quadrant across the upper abdomen and terminating in the right paracolic gutter. Musculoskeletal: No acute fracture or destructive lesion. Multilevel degenerative disc disease of the spine. 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 Diffuse anasarca. IMPRESSION: 1. Large, thick-walled fluid collection in the  lower abdomen and pelvis, measuring 10.9 x 15.8 x 13.1 cm ([id number]).  Hyperdense material in this collection likely reflects a combination of ingested material and enteric contrast from the prior CT rather than or hemorrhage. Given these findings, this is worrisome for underlying small-bowel perforation. 2. Moderate wall thickening throughout the majority of the small bowel, likely reactive to the intra-abdominal inflammation. Additionally, gaseous distension of a couple of segments of small bowel in the upper abdomen, possibly reflect changes of a reactive ileus. 3. Increasing locules of gas subjacent to the midline ventral incision (axial 55-70), extending the peritoneum to the subcutaneous fat, worrisome for extraluminal gas with possible fistulous communication. 4. Patchy ground-glass airspace opacities in the visualized lungs, which may represent underlying viral pneumonia or early pulmonary edema. Small bilateral pleural effusions, larger on the right than the left. 5. Diffuse anasarca. Critical Value/emergent results were called by telephone at the time of interpretation on 01/28/2024 at 11:21 am to provider [facility] , who verbally acknowledged these results. Electronically Signed   By: Rance Burrows M.D.   On: 01/28/2024 11:30     Anti-infectives: Anti-infectives (From admission, onward)    Start     Dose/Rate Route Frequency Ordered Stop   01/28/24 1000  piperacillin -tazobactam (ZOSYN ) IVPB 2.25 g        2.25 g 100 mL/hr over 30 Minutes Intravenous Every 8 hours 01/28/24 0909     01/18/24 1400  piperacillin -tazobactam (ZOSYN ) IVPB 3.375 g  Status:  Discontinued        3.375 g 12.5 mL/hr over 240 Minutes Intravenous Every 6 hours 01/18/24 1241 01/18/24 1242   01/18/24 1400  piperacillin -tazobactam (ZOSYN ) IVPB 3.375 g        3.375 g 100 mL/hr over 30 Minutes Intravenous Every 6 hours 01/18/24 1242 01/23/24 2046   01/18/24 0000  piperacillin -tazobactam (ZOSYN ) IVPB 2.25 g  Status:   Discontinued        2.25 g 100 mL/hr over 30 Minutes Intravenous Every 6 hours 01/17/24 2120 01/18/24 1241   01/12/24 0200  piperacillin -tazobactam (ZOSYN ) IVPB 3.375 g  Status:  Discontinued        3.375 g 12.5 mL/hr over 240 Minutes Intravenous Every 8 hours 01/11/24 2120 01/17/24 2120   01/11/24 1815  piperacillin -tazobactam (ZOSYN ) IVPB 3.375 g        3.375 g 100 mL/hr over 30 Minutes Intravenous  Once 01/11/24 1806 01/11/24 1921        Assessment/Plan LAPAROTOMY, EXPLORATORY RIGHT COLECTOMY (without anastomosis) PLACEMENT OF ABTHERA WOUND VAC DRAINAGE OF INTRA-ABDOMINAL ABSCESS  4/16 EW   RE-EXPLORATION ABDOMEN, PLACEMENT OF ABTHERA WOUND VAC, SMALL BOWEL RESECTION 01/16/2024 EW   Re-exploration abdomen, small bowel resection, creation of end ileostomy, abd closure, placement wound vac 4/19 EW  - TNA for nutritional support.  Cont NPO for right now.  CT scan was certainly concerning for leak given presumed extravasation of contrast noted in this fluid collection.  Drains is bloody right now with no succus noted. -IR drain placed in suspect abscess/leak 4/30, high output, bloody, monitor -can hold on NGT and await determination of whether he truly has a small bowel leak or not.  If his drain output remains bloody and not c/w leak, maybe we can retry oral intake in the next day or so. - Cont to monitor ostomy output - Completed post op abx, but WBC increasing, tachy, lethargic 4/29, with increasing temp-Zosyn  restarted  WBC much improved and down to 12K today   FEN - NPO except a few ice chips given by RN due to concern for SB leak  VTE - SCDs, heparin  gtt held due to anemia ID - Completed post op abx. WBC much improved to 12K today after reinititiation of Zosyn  on 4/29  - Per TRH -  Pleural effusions Septic shock - resolved ABL anemia - monitor and transfuse as needed  Coagulopathy - resolved AKI - started CRRT 4/19,  defer to renal.  Holding HD as UOP is good and Cr  improving Acute hypoxemic resp failure - extubated H/o VTE/PE/DVT - scds for now; was on coumadin      LOS: 19 days    Leone Ralphs ,MD [facility] Surgery 01/30/2024, 8:49 AM Please see Amion for pager number during day hours 7:00am-4:30pm or 7:00am -11:30am on weekends

## 2024-01-30 NOTE — Progress Notes (Addendum)
 Alexander [name]  Assessment/ Plan: Pt is a 63 y.o. yo male with past medical history of DVT, PE on Coumadin  admitted on 4/12 with rectal bleed and hypotension seen for AKI.  # AKI - mostly anuric, creat 1.0 early in this hospital stay. After onset of severe shock 4/16 pt had progressive AKI. UA negative. Renal US  shows no obstruction. AKI due to ATN primarily due to shock +/- IV contrast.  CRRT from 4/19-4/24. Urine output slowly increasing but creatinine rising.  Showing some moderate signs of improvement - Last HD on 4/27 and UOP was sun-mon from 0400-0400 then till Tues . Renal function cont to worsen + hyperkalemia on 4/30. - Appreciate RIJ temp cath by VIR for temp cath on 4/30; surprisingly UOP 2.5L / 24hrs. Renal function still not great, potassium better; will hold dialysis for another 24hrs to determine if he is starting to regain renal function  #S/P cardiac arrest: 4/17  #Shock/ hypotension: Off of pressors now.  # Acute diverticulitis: w/ perforated ascending colon, s/p ileostomy, R hemicolectomy and some small bowel resection. Per gen surg/ pmd. New fluid collection in abd but pt refused abd drain.   On TPN  #VDRF: Improved now extubated   Subjective: Keeps saying wants to speak to his attorney, wants to go home and also ice in a cup.  Denies nausea, shortness of breath  Last  dialysis on 4/27.  Objective Vital signs in last 24 hours: Vitals:   01/30/24 0228 01/30/24 0416 01/30/24 0444 01/30/24 0727  BP: (!) 143/63 (!) 158/67  138/68  Pulse: 99     Resp: 18 (!) 23    Temp: 98.9 F (37.2 C) 98.5 F (36.9 C)  98.5 F (36.9 C)  TempSrc: Oral Oral  Oral  SpO2:      Weight:   80.3 kg   Height:       Weight change:   Intake/Output Summary (Last 24 hours) at 01/30/2024 0937 Last data filed at 01/30/2024 0727 Gross per 24 hour  Intake 3482 ml  Output 5410 ml  Net -1928 ml       Labs: RENAL PANEL Recent Labs   Lab 01/25/24 0540 01/26/24 0610 01/27/24 0045 01/28/24 1653 01/29/24 0449 01/30/24 0445  NA 140 142 139 140 140 143  K 3.6 3.4* 3.3* 5.3* 5.3* 4.1  CL 106 106 103 104 105 107  CO2 22 20* 25 21* 20* 19*  GLUCOSE 153* 152* 108* 81 90 153*  BUN 81* 97* 55* 81* 87* 89*  CREATININE 5.21* 6.11* 3.91* 5.79* 6.01* 5.91*  CALCIUM  7.6* 7.7* 7.8* 7.9* 7.8* 7.6*  MG 2.2 2.1 1.7 2.0  --  1.9  PHOS 4.7* 4.5 2.3*  --  8.5* 6.2*  ALBUMIN  <1.5* <1.5* <1.5* <1.5*  --  <1.5*    Liver Function Tests: Recent Labs  Lab 01/24/24 0552 01/25/24 0540 01/27/24 0045 01/28/24 1653 01/30/24 0445  AST 41  --   --  33 39  ALT 118*  --   --  49* 44  ALKPHOS 63  --   --  65 56  BILITOT 1.8*  --   --  1.5* 1.4*  PROT 5.4*  --   --  5.8* 5.9*  ALBUMIN  1.5*  1.6*   < > <1.5* <1.5* <1.5*   < > = values in this interval not displayed.   No results for input(s): "LIPASE", "AMYLASE" in the last 168 hours. No results for input(s): "AMMONIA" in the last  168 hours. CBC: Recent Labs    07/19/23 0448 07/22/23 0441 12/03/23 1437 12/04/23 1531 12/05/23 0036 01/11/24 1458 01/11/24 1957 01/12/24 0056 01/12/24 1044 01/12/24 1552 01/28/24 1745 01/29/24 0449 01/29/24 1623 01/29/24 2118 01/30/24 0445  HGB 10.1*   < > 7.6 Repeated and verified X2.*   < >  --    < > 6.7*   < >  --    < > 6.3* 7.5* 7.3* 6.6* 7.5*  MCV 92.7   < > 75.5*   < >  --    < > 76.6*   < >  --    < > 94.7 91.6 89.4 91.1 88.5  VITAMINB12 222  --   --   --  100*  --   --   --  93*  --   --   --   --   --   --   FOLATE 7.5  --   --   --  8.0  --   --   --  9.3  --   --   --   --   --   --   FERRITIN 25  --  4.5*  --  3*  --  19*  --   --   --   --   --   --   --   --   TIBC 328  --  382.2  --  364  --  293  --   --   --   --   --   --   --   --   IRON  25*  --  15*  --  11*  --  11*  --   --   --   --   --   --   --   --   RETICCTPCT 1.4  --   --   --  0.7  --  0.8  --   --   --   --   --   --   --   --    < > = values in this interval  not displayed.    Cardiac Enzymes: No results for input(s): "CKTOTAL", "CKMB", "CKMBINDEX", "TROPONINI" in the last 168 hours. CBG: Recent Labs  Lab 01/29/24 2046 01/29/24 2136 01/30/24 0036 01/30/24 0420 01/30/24 0747  GLUCAP 130* 140* 147* 155* 144*    Iron  Studies: No results for input(s): "IRON ", "TIBC", "TRANSFERRIN", "FERRITIN" in the last 72 hours. Studies/Results: IR Fluoro Guide CV Line Right Result Date: 01/29/2024 INDICATION: 63 year old male with history of acute kidney injury requiring central venous access for hemodialysis. EXAM: NON-TUNNELED CENTRAL VENOUS HEMODIALYSIS CATHETER PLACEMENT WITH ULTRASOUND AND FLUOROSCOPIC GUIDANCE COMPARISON:  None Available. MEDICATIONS: None FLUOROSCOPY TIME:  One mGy COMPLICATIONS: None immediate. PROCEDURE: Informed written consent was obtained from the patient after a discussion of the risks, benefits, and alternatives to treatment. Questions regarding the procedure were encouraged and answered. The right neck and chest were prepped with chlorhexidine  in a sterile fashion, and a sterile drape was applied covering the operative field. Maximum barrier sterile technique with sterile gowns and gloves were used for the procedure. A timeout was performed prior to the initiation of the procedure. After the overlying soft tissues were anesthetized, a small venotomy incision was created and a micropuncture kit was utilized to access the internal jugular vein. Real-time ultrasound guidance was utilized for vascular access including the acquisition of a permanent ultrasound image documenting patency of the accessed vessel. The microwire  was utilized to measure appropriate catheter length. A stiff glidewire was advanced to the level of the IVC. Under fluoroscopic guidance, the venotomy was serially dilated, ultimately allowing placement of a 16 cm temporary Trialysis catheter with tip ultimately terminating within the superior aspect of the right atrium.  Final catheter positioning was confirmed and documented with a spot radiographic image. The catheter aspirates and flushes normally. The catheter was flushed with appropriate volume heparin  dwells. The catheter exit site was secured with a 0 silk retention suture. A dressing was placed. The patient tolerated the procedure well without immediate post procedural complication. IMPRESSION: Successful placement of a right internal jugular approach 16 cm temporary dialysis catheter with tip terminating with in the superior aspect of the right atrium. The catheter is ready for immediate use. PLAN: This catheter may be converted to a tunneled dialysis catheter at a later date as indicated. Creasie Doctor, MD [facility] Radiology Electronically Signed   By: Creasie Doctor M.D.   On: 01/29/2024 16:41   IR US  Guide Vasc Access Right Result Date: 01/29/2024 INDICATION: 63 year old male with history of acute kidney injury requiring central venous access for hemodialysis. EXAM: NON-TUNNELED CENTRAL VENOUS HEMODIALYSIS CATHETER PLACEMENT WITH ULTRASOUND AND FLUOROSCOPIC GUIDANCE COMPARISON:  None Available. MEDICATIONS: None FLUOROSCOPY TIME:  One mGy COMPLICATIONS: None immediate. PROCEDURE: Informed written consent was obtained from the patient after a discussion of the risks, benefits, and alternatives to treatment. Questions regarding the procedure were encouraged and answered. The right neck and chest were prepped with chlorhexidine  in a sterile fashion, and a sterile drape was applied covering the operative field. Maximum barrier sterile technique with sterile gowns and gloves were used for the procedure. A timeout was performed prior to the initiation of the procedure. After the overlying soft tissues were anesthetized, a small venotomy incision was created and a micropuncture kit was utilized to access the internal jugular vein. Real-time ultrasound guidance was utilized  for vascular access including the acquisition of a permanent ultrasound image documenting patency of the accessed vessel. The microwire was utilized to measure appropriate catheter length. A stiff glidewire was advanced to the level of the IVC. Under fluoroscopic guidance, the venotomy was serially dilated, ultimately allowing placement of a 16 cm temporary Trialysis catheter with tip ultimately terminating within the superior aspect of the right atrium. Final catheter positioning was confirmed and documented with a spot radiographic image. The catheter aspirates and flushes normally. The catheter was flushed with appropriate volume heparin  dwells. The catheter exit site was secured with a 0 silk retention suture. A dressing was placed. The patient tolerated the procedure well without immediate post procedural complication. IMPRESSION: Successful placement of a right internal jugular approach 16 cm temporary dialysis catheter with tip terminating with in the superior aspect of the right atrium. The catheter is ready for immediate use. PLAN: This catheter may be converted to a tunneled dialysis catheter at a later date as indicated. Creasie Doctor, MD [facility] Radiology Electronically Signed   By: Creasie Doctor M.D.   On: 01/29/2024 16:41   CT GUIDED PERITONEAL/RETROPERITONEAL FLUID DRAIN BY PERC CATH Result Date: 01/29/2024 INDICATION: 63 year old male with postoperative intra-abdominal fluid collection presenting for drainage. EXAM: CT PERC DRAIN PERITONEAL ABCESS COMPARISON:  CT abdomen pelvis from 01/28/2024 MEDICATIONS: The patient is currently admitted to the hospital and receiving intravenous antibiotics. The antibiotics were administered within an appropriate time frame prior to the initiation of the procedure. ANESTHESIA/SEDATION: Moderate (conscious)  sedation was employed during this procedure. A total of Versed  1 2 mg and Fentanyl  75 mcg was administered  intravenously. Moderate Sedation Time: 18 minutes. The patient's level of consciousness and vital signs were monitored continuously by radiology nursing throughout the procedure under my direct supervision. CONTRAST:  None COMPLICATIONS: None immediate. PROCEDURE: RADIATION DOSE REDUCTION: This exam was performed according to the departmental dose-optimization program which includes automated exposure control, adjustment of the mA and/or kV according to patient size and/or use of iterative reconstruction technique. Informed written consent was obtained from the patient after a discussion of the risks, benefits and alternatives to treatment. The patient was placed supine on the CT gantry and a pre procedural CT was performed re-demonstrating the known abscess/fluid collection within the midline lower abdomen. The procedure was planned. A timeout was performed prior to the initiation of the procedure. The right midline lower abdomen was prepped and draped in the usual sterile fashion. The overlying soft tissues were anesthetized with 1% lidocaine  with epinephrine . Appropriate trajectory was planned with the use of a 22 gauge spinal needle. An 18 gauge trocar needle was advanced into the abscess/fluid collection and a short Amplatz super stiff wire was coiled within the collection. Appropriate positioning was confirmed with a limited CT scan. The tract was serially dilated allowing placement of a 10 [place] all-purpose drainage catheter. Appropriate positioning was confirmed with a limited postprocedural CT scan. A proximally 100 ml of sanguinous fluid was aspirated. The tube was connected to a bulb suction and sutured in place. A dressing was placed. The patient tolerated the procedure well without immediate post procedural complication. IMPRESSION: Successful CT guided placement of a 10 French all purpose drain catheter into the midline abdominal fluid collection with aspiration of approximately 100 mL of sanguinous  fluid. Samples were sent to the laboratory as requested by the ordering clinical team. Creasie Doctor, MD [facility] Radiology Electronically Signed   By: Creasie Doctor M.D.   On: 01/29/2024 16:39   DG Knee Right Port Result Date: 01/29/2024 CLINICAL DATA:  Right knee pain. EXAM: PORTABLE RIGHT KNEE - 1-2 VIEW COMPARISON:  Right knee radiographs dated 10/24/2012. FINDINGS: No evidence of acute fracture, dislocation, or joint effusion. Mild medial compartment joint space narrowing. Vascular calcifications are noted. IMPRESSION: 1. No acute osseous abnormality. 2. Mild medial compartment joint space narrowing. Electronically Signed   By: Mannie Seek M.D.   On: 01/29/2024 12:09   IR Fluoro Guide CV Line Right Result Date: 01/28/2024 INDICATION: 63 year old male referred for non tunneled central venous catheter EXAM: IMAGE GUIDED NON TUNNELED CENTRAL VENOUS CATHETER MEDICATIONS: None ANESTHESIA/SEDATION: Moderate (conscious) sedation was not employed during this procedure. FLUOROSCOPY: Radiation Exposure Index (as provided by the fluoroscopic device): 10.1 mGy Kerma COMPLICATIONS: None PROCEDURE: Informed written consent was obtained from the patient's family after a discussion of the risks, benefits, and alternatives to treatment. Questions regarding the procedure were encouraged and answered. The right neck was prepped with chlorhexidine  in a sterile fashion, and a sterile drape was applied covering the operative field. Maximum barrier sterile technique with sterile gowns and gloves were used for the procedure. A timeout was performed prior to the initiation of the procedure. A micropuncture kit was utilized to access the right internal jugular vein under direct, real-time ultrasound guidance after the overlying soft tissues were anesthetized with 1% lidocaine  with epinephrine . Ultrasound image documentation was performed. Wire was advanced to the level of the  IVC. A triple-lumen standard 30 cm  central venous catheter was advanced on the wire, with the tip position at the level of the right atrium. Final catheter positioning was confirmed and documented with a spot radiographic image. The catheter aspirates and flushes normally. The catheter was flushed with appropriate volume heparin  dwells. Dressings were applied. The patient tolerated the procedure well without immediate post procedural complication. IMPRESSION: Status post image guided right IJ non tunneled central venous catheter. Signed, Marciano Settles. Rexine Cater, RPVI [facility] Radiology Electronically Signed   By: Myrlene Asper D.O.   On: 01/28/2024 16:15   CT ABDOMEN PELVIS WO CONTRAST Result Date: 01/28/2024 CLINICAL DATA:  Abdominal pain, post-op eval for infection, post op complication EXAM: CT ABDOMEN AND PELVIS WITHOUT CONTRAST TECHNIQUE: Multidetector CT imaging of the abdomen and pelvis was performed following the standard protocol without IV contrast. RADIATION DOSE REDUCTION: This exam was performed according to the departmental dose-optimization program which includes automated exposure control, adjustment of the mA and/or kV according to patient size and/or use of iterative reconstruction technique. COMPARISON:  January 24, 2024, January 14, 2024, January 11, 2024 FINDINGS: Of [name], the lack of intravenous contrast limits evaluation of the solid organ parenchyma and vascularity. The study is degraded by patient's arm positioning. Lower chest: Patchy ground-glass airspace opacities in the visualized lungs. Small bilateral pleural effusions with bibasilar compressive atelectasis, larger on the right than the left. Hepatobiliary: No mass. No radiopaque stones or wall thickening of the gallbladder. No intrahepatic or extrahepatic biliary ductal dilation. Pancreas: No mass or main ductal dilation. No peripancreatic inflammation or fluid collection. Spleen:  Normal size. No mass. Adrenals/Urinary Tract: No adrenal masses. No renal mass. No hydronephrosis or nephrolithiasis. Mostly decompressed urinary bladder with urinary catheter in place. Stomach/Bowel: The stomach contains ingested material without focal abnormality. Mild diffuse wall thickening throughout the small bowel. Gaseous distension of a short segment of small bowel measuring 4 cm in the upper abdomen. Right lower quadrant diverting ileostomy with an extended right hemicolectomy. Descending and sigmoid colonic diverticulosis. Vascular/Lymphatic: No aortic aneurysm. Scattered aortoiliac atherosclerosis. No intraabdominal or pelvic lymphadenopathy. Reproductive: No prostatomegaly. No free pelvic fluid. Other: There is a large, thick-walled fluid collection in the lower abdomen and pelvis, measuring 10.9 x 15.8 x 13.1 cm. Hyperdense layering material present within this fluid collection, which may represent ingested material or hemorrhage. Multiple locules of gas noted along the ventral incision (axial 55-70), extending into the midline incision subcutaneous fat, worrisome for extraluminal gas with possible fistulous communication. Diffuse mesenteric edema with layering ascites in the left paracolic gutter. There is also loculated fluid in the right paracolic gutter. Postsurgical drainage catheter extending from the left lower quadrant across the upper abdomen and terminating in the right paracolic gutter. Musculoskeletal: No acute fracture or destructive lesion. Multilevel degenerative disc disease of the spine. 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 Diffuse anasarca. IMPRESSION: 1. Large, thick-walled fluid collection in the lower abdomen and pelvis, measuring 10.9 x 15.8 x 13.1 cm ([id number]). Hyperdense material in this collection likely reflects a combination of ingested material and enteric contrast from the prior CT  rather than or hemorrhage. Given these findings, this is worrisome for underlying small-bowel perforation. 2. Moderate wall thickening throughout the majority of the small bowel, likely reactive to the intra-abdominal inflammation. Additionally, gaseous distension of a couple of segments of small bowel in the upper abdomen, possibly reflect changes of a reactive ileus. 3. Increasing locules of  gas subjacent to the midline ventral incision (axial 55-70), extending the peritoneum to the subcutaneous fat, worrisome for extraluminal gas with possible fistulous communication. 4. Patchy ground-glass airspace opacities in the visualized lungs, which may represent underlying viral pneumonia or early pulmonary edema. Small bilateral pleural effusions, larger on the right than the left. 5. Diffuse anasarca. Critical Value/emergent results were called by telephone at the time of interpretation on 01/28/2024 at 11:21 am to provider [facility] , who verbally acknowledged these results. Electronically Signed   By: Rance Burrows M.D.   On: 01/28/2024 11:30      Medications: Infusions:  sodium chloride  Stopped (01/24/24 2213)   piperacillin -tazobactam Stopped (01/30/24 0229)   TPN ADULT (ION) 70 mL/hr at 01/30/24 1914   TPN ADULT (ION)      Scheduled Medications:  Chlorhexidine  Gluconate Cloth  6 each Topical Daily   diclofenac  Sodium  2 g Topical QID   mouth rinse  15 mL Mouth Rinse 4 times per day   pantoprazole  (PROTONIX ) IV  40 mg Intravenous Q12H   sodium chloride  flush  5 mL Intracatheter Q8H    have reviewed scheduled and prn medications.  Physical Exam: GEN: sitting in bed, nad ENT: no nasal discharge, mmm EYES: no scleral icterus, eomi CV: normal rate, no murmurs PULM: no iwob, bilateral chest rise ABD: NABS, non-distended SKIN: no rashes or jaundice EXT: tr edema, warm and well perfused ACCESS: RIJ temporary dialysis catheter   Joanna Hall W 01/30/2024,9:37 AM  LOS: 19 days

## 2024-01-30 NOTE — Progress Notes (Signed)
     Brief Progress Note:  Contacted for IR drain concerns this afternoon. After flushing the drain, RN noted air in line and difficulty charging drain. Drain output is also much less than day prior. Output by Drain (mL) 01/28/24 0701 - 01/28/24 1900 01/28/24 1901 - 01/29/24 0700 01/29/24 0701 - 01/29/24 1900 01/29/24 1901 - 01/30/24 0700 01/30/24 0701 - 01/30/24 1613  Closed System Drain LLQ Bulb (JP) 19 Fr. 20      Closed System Drain 2 Midline Abdomen Bulb (JP) 10 Fr.   250 710 60     Current examination: Flushes easily per RN. Insertion site remains unremarkable. Suture is in place.  Dressed appropriately. Recently changed by RN, no concerns with leakage of fluid around drain site.  At this time the bulb appears reasonably charged with newly collected fluid, appearance is sanguinous fluid as previously assessed. Though output has slowed, has not stopped. No increased tenderness around insertion. VSS, afebrile. Patient more agreeable to exam than with previous visit.  Plan: Shared assessment with [facility], agrees with continuing current plan tonight (TID flush, bulb suction, measure output, etc) and reassess in AM. If bulb losing charge too rapidly continues to be a concern due to communication with bowel from suspected perforation, will discuss going to gravity bag.     Electronically Signed: Terressa Fess, NP 01/30/2024, 4:11 PM

## 2024-01-30 NOTE — Plan of Care (Signed)
  Problem: Education: Goal: Knowledge of General Education information will improve Description: Including pain rating scale, medication(s)/side effects and non-pharmacologic comfort measures Outcome: Progressing   Problem: Health Behavior/Discharge Planning: Goal: Ability to manage health-related needs will improve Outcome: Progressing   Problem: Clinical Measurements: Goal: Ability to maintain clinical measurements within normal limits will improve Outcome: Progressing Goal: Will remain free from infection Outcome: Progressing Goal: Diagnostic test results will improve Outcome: Progressing Goal: Respiratory complications will improve Outcome: Progressing Goal: Cardiovascular complication will be avoided Outcome: Progressing   Problem: Nutrition: Goal: Adequate nutrition will be maintained Outcome: Progressing   Problem: Coping: Goal: Level of anxiety will decrease Outcome: Progressing   Problem: Elimination: Goal: Will not experience complications related to bowel motility Outcome: Progressing Goal: Will not experience complications related to urinary retention Outcome: Progressing   Problem: Pain Managment: Goal: General experience of comfort will improve and/or be controlled Outcome: Progressing   Problem: Safety: Goal: Ability to remain free from injury will improve Outcome: Progressing   Problem: Skin Integrity: Goal: Risk for impaired skin integrity will decrease Outcome: Progressing   Problem: Activity: Goal: Ability to tolerate increased activity will improve Outcome: Progressing   Problem: Respiratory: Goal: Ability to maintain a clear airway and adequate ventilation will improve Outcome: Progressing   Problem: Role Relationship: Goal: Method of communication will improve Outcome: Progressing   Problem: Education: Goal: Ability to describe self-care measures that may prevent or decrease complications (Diabetes Survival Skills Education) will  improve Outcome: Progressing Goal: Individualized Educational Video(s) Outcome: Progressing   Problem: Coping: Goal: Ability to adjust to condition or change in health will improve Outcome: Progressing   Problem: Fluid Volume: Goal: Ability to maintain a balanced intake and output will improve Outcome: Progressing   Problem: Health Behavior/Discharge Planning: Goal: Ability to identify and utilize available resources and services will improve Outcome: Progressing Goal: Ability to manage health-related needs will improve Outcome: Progressing   Problem: Metabolic: Goal: Ability to maintain appropriate glucose levels will improve Outcome: Progressing   Problem: Nutritional: Goal: Maintenance of adequate nutrition will improve Outcome: Progressing Goal: Progress toward achieving an optimal weight will improve Outcome: Progressing   Problem: Skin Integrity: Goal: Risk for impaired skin integrity will decrease Outcome: Progressing   Problem: Tissue Perfusion: Goal: Adequacy of tissue perfusion will improve Outcome: Progressing   Problem: Safety: Goal: Non-violent Restraint(s) Outcome: Progressing

## 2024-01-31 DIAGNOSIS — K631 Perforation of intestine (nontraumatic): Secondary | ICD-10-CM | POA: Diagnosis not present

## 2024-01-31 LAB — MAGNESIUM: Magnesium: 1.9 mg/dL (ref 1.7–2.4)

## 2024-01-31 LAB — CBC
HCT: 22.5 % — ABNORMAL LOW (ref 39.0–52.0)
HCT: 22.4 % — ABNORMAL LOW (ref 39.0–52.0)
Hemoglobin: 7.4 g/dL — ABNORMAL LOW (ref 13.0–17.0)
Hemoglobin: 7.3 g/dL — ABNORMAL LOW (ref 13.0–17.0)
MCH: 29.8 pg (ref 26.0–34.0)
MCH: 29.7 pg (ref 26.0–34.0)
MCHC: 32.9 g/dL (ref 30.0–36.0)
MCHC: 32.6 g/dL (ref 30.0–36.0)
MCV: 90.7 fL (ref 80.0–100.0)
MCV: 91.1 fL (ref 80.0–100.0)
Platelets: 317 10*3/uL (ref 150–400)
Platelets: 294 10*3/uL (ref 150–400)
RBC: 2.48 MIL/uL — ABNORMAL LOW (ref 4.22–5.81)
RBC: 2.46 MIL/uL — ABNORMAL LOW (ref 4.22–5.81)
RDW: 18.2 % — ABNORMAL HIGH (ref 11.5–15.5)
RDW: 18.1 % — ABNORMAL HIGH (ref 11.5–15.5)
WBC: 11.2 10*3/uL — ABNORMAL HIGH (ref 4.0–10.5)
WBC: 10.2 10*3/uL (ref 4.0–10.5)
nRBC: 0.2 % (ref 0.0–0.2)
nRBC: 0 % (ref 0.0–0.2)

## 2024-01-31 LAB — RENAL FUNCTION PANEL
Albumin: <1.5 g/dL — ABNORMAL LOW (ref 3.5–5.0)
Anion gap: 12 (ref 5–15)
BUN: 86 mg/dL — ABNORMAL HIGH (ref 8–23)
CO2: 25 mmol/L (ref 22–32)
Calcium: 7.5 mg/dL — ABNORMAL LOW (ref 8.9–10.3)
Chloride: 111 mmol/L (ref 98–111)
Creatinine, Ser: 5.67 mg/dL — ABNORMAL HIGH (ref 0.61–1.24)
GFR, Estimated: 11 mL/min — ABNORMAL LOW (ref >60)
Glucose, Bld: 157 mg/dL — ABNORMAL HIGH (ref 70–99)
Phosphorus: 4.8 mg/dL — ABNORMAL HIGH (ref 2.5–4.6)
Potassium: 3.2 mmol/L — ABNORMAL LOW (ref 3.5–5.1)
Sodium: 148 mmol/L — ABNORMAL HIGH (ref 135–145)

## 2024-01-31 LAB — HEPARIN LEVEL (UNFRACTIONATED): Heparin Unfractionated: <0.1 [IU]/mL — ABNORMAL LOW (ref 0.30–0.70)

## 2024-01-31 MED ORDER — TRACE MINERALS CU-MN-SE-ZN 300-55-60-3000 MCG/ML IV SOLN
INTRAVENOUS | Status: AC
  Filled 2024-01-31: qty 728

## 2024-01-31 MED ORDER — HEPARIN (PORCINE) 25000 UT/250ML-% IV SOLN
2100.0000 [IU]/h | INTRAVENOUS | Status: AC
  Administered 2024-02-01: 1800 [IU]/h via INTRAVENOUS
  Administered 2024-02-02: 2100 [IU]/h via INTRAVENOUS
  Administered 2024-02-02 (×2): 2000 [IU]/h via INTRAVENOUS
  Filled 2024-01-31 (×3): qty 250

## 2024-01-31 MED ORDER — LINEZOLID 600 MG/300ML IV SOLN
600.0000 mg | Freq: Two times a day (BID) | INTRAVENOUS | Status: DC
  Administered 2024-01-31 – 2024-02-01 (×2): 600 mg via INTRAVENOUS
  Filled 2024-01-31 (×3): qty 300

## 2024-01-31 MED ORDER — POTASSIUM CHLORIDE 10 MEQ/50ML IV SOLN
10.0000 meq | INTRAVENOUS | Status: AC
  Administered 2024-01-31 (×3): 10 meq via INTRAVENOUS
  Filled 2024-01-31 (×3): qty 50

## 2024-01-31 MED ORDER — HEPARIN (PORCINE) 25000 UT/250ML-% IV SOLN
1000.0000 [IU]/h | INTRAVENOUS | Status: DC
  Administered 2024-01-31: 1000 [IU]/h via INTRAVENOUS
  Filled 2024-01-31: qty 250

## 2024-01-31 MED ORDER — CALCIUM POLYCARBOPHIL 625 MG PO TABS
625.0000 mg | ORAL_TABLET | Freq: Every day | ORAL | Status: DC
Start: 2024-01-31 — End: 2024-02-02
  Administered 2024-01-31 – 2024-02-01 (×2): 625 mg via ORAL
  Filled 2024-01-31 (×3): qty 1

## 2024-01-31 MED ORDER — SUCRALFATE 1 GM/10ML PO SUSP
1.0000 g | Freq: Two times a day (BID) | ORAL | Status: DC
Start: 2024-01-31 — End: 2024-02-09
  Administered 2024-01-31 – 2024-02-09 (×19): 1 g via ORAL
  Filled 2024-01-31 (×19): qty 10

## 2024-01-31 NOTE — Progress Notes (Signed)
 [facility] Progress Note Patient: Alexander [name] XBM:[id number] DOB: Apr 01, 1961 DOA: 01/11/2024  DOS: the patient was seen and examined on 01/31/2024  Brief Hospital Course: PMH of PE on Coumadin , HFpEF presented to the hospital with BRBPR.  Was found to have colon perforation.  GI, general surgery were consulted.  Underwent ex lap followed by ICU stay.  Developed AKI requiring CRRT. Now out of the ICU but remains volume overloaded and getting HD.  Significant event. 4/12 admitted for GI bleed and BRBPR.  GI consulted conservative measures.  EGD colonoscopy was recommended. 4/15 repeat CT scan shows evidence of concern for contained perforation in the ascending colon.  General surgery consulted.  Recommending ex lap. 4/16 transferred to ICU. 4/16 cardiorespiratory arrest, achieved ROSC. 4/17 back to the OR, wound VAC placement and small bowel resection. 4/19 Back to the OR s/p exploratory laparotomy, ileostomy creation, small bowel resection, wound VAC in place. nephrology consulted for CRRT 4/23 extubated; off pressors 4/25 Transferred from [facility] ICU to [facility] progressive due to need for hemodialysis. 4/29 triple-lumen CVC placement for TPN. 4/30 CT-guided drain placement.  Dark bloody output.  Anticoagulation on hold.  Also HD catheter placement on right with plan for inpatient HD. 5/2.  Diet advanced to clear liquid diet.  Assessment and Plan: Spontaneous perforation of colon SP right colectomy. Underwent 3 ex lap as above, SP right hemicolectomy and small bowel resection as well as ileostomy creation. Has a wound VAC. Currently has IV TPN. Management per general surgery. 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 Current plan is conservative management with a drain and a controlled fistula. Bowel rest recommended. NG recommended-patient refused.  Surgery recommended to  advance to CLD.  Monitor.   AKI  Initially required CRRT. Suspect combination of shock with ATN. Possibility of IV contrast injury also cannot be ruled out. Continues to third space.  Reports of arthralgia. Urine output is actually improving.  Nephrology recommend to hold off on HD. Currently has a temp HD catheter.   Acute blood loss anemia as well as anemia of critical illness Iron  deficiency anemia.  B12 deficiency. Presented with BRBPR.  Hemoglobin at baseline around 10.  Hemoglobin on admission was 7.6.  Hemoglobin at its lowest point was 4.0 on 4/17. Currently hemoglobin is stable around 7 Transfuse hemoglobin less than 7. Will resume heparin  without bolus and monitor. Last PRBC transfusion was on 4/30. Folate normal, B12 low.  Iron  repleted.  B12 repleted.  Monitor.  ENTEROCOCCUS FAECIUM infection. Drain culture is growing E faecium.  Sensitivity currently pending.  Given clinical improvement for now I will continue with IV Zosyn  although pending sensitivity or if decompensates again may require vancomycin.  Multifactorial shock. Patient has needed vasopressors throughout the hospital stay multiple times. After aggressive IV hydration as well as IV albumin  blood pressure has improved. Monitor.  Septic shock.  Present on admission. Sepsis physiology appears to have resolved for now.  Hypotension. Developed another episode of hypotension on 4/29. Most likely in the setting of pain medication. Zosyn  have been resumed. Improving leukocytosis. Will monitor for improvement. IV albumin  given.  History of PE. 07/2023. Presented with BRBPR.  Anticoagulation has been on hold.  Was resumed with heparin . Given anemia seen again heparin  is on hold. Especially in the setting of drain output being bloody. Remains at risk for recurrent PE.   Acute hypoxic respiratory failure Requiring intubation. Was extubated  on 4/23.  Intermittently on oxygen.  Continue incentive spirometry.    Diverticulitis Treated before.  Agitation. Likely hospital induced delirium. Monitor.  As needed Ativan .  Hypoglycemia. As the patient was not able to get his TPN his blood sugars were low. Treated with D10.  Now better.  Acute right knee pain. Concern for gout. Reports pins-and-needles like pain in his right knee. Suspect arthralgia in the setting of need for HD. X-ray unremarkable. Lower extremity venous Doppler ordered. Now appears with swelling and warmth.  No significant redness so far.  Uric acid is normal.  CRP ESR elevated although it could be multifactorial.  Not a candidate for IV steroids, IV Toradol, other oral NSAIDs or colchicine .  Continue pain control for now. Reports no pain as of 5/2.  Hiccups. As needed Thorazine .   Subjective: No nausea no vomiting no fever no chills.  Denies any any pain in his knee.  Physical Exam: Clear to auscultation. S1-S2 present Bowel sounds still sluggish. Improving warmth of right lower extremity.  Data Reviewed: I have Reviewed nursing notes, Vitals, and Lab results. Reviewed CBC and CMP.  Reordered CBC and CMP.  Ordered heparin .  Disposition: Status is: Inpatient Remains inpatient appropriate because: Monitor for renal function and stability of H&H.  SCD's Start: 01/15/24 1400   Family Communication: No one at bedside.  Discussed with son on the phone on 5/1 Level of care: Progressive   Vitals:   01/31/24 0500 01/31/24 0750 01/31/24 1107 01/31/24 1513  BP:  (!) 143/71 (!) 147/71 (!) 148/67  Pulse:  81 89 97  Resp:  12 10 11   Temp:  98.6 F (37 C) 99.2 F (37.3 C) 99.3 F (37.4 C)  TempSrc:  Oral Oral Oral  SpO2:      Weight: 80.1 kg     Height:         Author: Charlean Congress, MD 01/31/2024 7:31 PM  Please look on [email or web address] to find out who is on call.

## 2024-01-31 NOTE — Plan of Care (Signed)
  Problem: Nutrition: Goal: Adequate nutrition will be maintained Outcome: Progressing   Problem: Coping: Goal: Level of anxiety will decrease Outcome: Progressing   Problem: Elimination: Goal: Will not experience complications related to bowel motility Outcome: Progressing Goal: Will not experience complications related to urinary retention Outcome: Progressing   Problem: Safety: Goal: Ability to remain free from injury will improve Outcome: Progressing   Problem: Skin Integrity: Goal: Risk for impaired skin integrity will decrease Outcome: Progressing
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
## 2024-01-31 NOTE — Progress Notes (Signed)
 PHARMACY - TOTAL PARENTERAL NUTRITION CONSULT NOTE   Indication: colon perforation,  intolerance to enteral nutrition  Patient Measurements: Height: 5\' 9"  (175.3 cm) Weight: 80.1 kg (176 lb 9.4 oz) IBW/kg (Calculated) : 70.7 TPN AdjBW (KG): 78.1 Body mass index is 26.08 kg/m.  Assessment: 62yoM admitted 4/12 for rectal bleeding and weakness s/p multiple GI surgeries with findings of colonic perforation requiring resection of R colon, closure on 4/19. Pt with high OG output post-operatively with no meal intake since admission. Prior to admission, patient reports intake of "normal food". Pharmacy to start TPN for prolonged duration without enteral nutrition and prolonged ileus.   TPN discontinued 4/28 with full liquid diet and displaced central line, however resumed 4/30 with new line due to need for bowel rest and findings on 4/29 CT indicating patient is high risk for return to OR for EC fistula or additional perforations. Patient refused replacement of NGT.  Glucose / Insulin : no hx DM, CBG <160, off SSI Electrolytes: Na 148 up, K 3.2, Co2 25 up, Mg 1.9 (none in TPN), CoCa 9.5, Phos 4.8 down (none in TPN), others wnl Required aggressive Ca replacement earlier in admission Renal: SCr 5.67 (BL 1) , BUN 86 CRRT 4/19 >> 4/24. Last HD 4/27 PM with 1L removed.  Hepatic: Aslk phos/AST/ALT wnl, Tbili 1.4, albumin  <1.5, TG 63 on 4/28 Intake / Output; MIVF: UOP 1.1 ml/kg/hr, drain (bloody), stool   GI Imaging: 4/15 CT A/P: wall thickening of R colon with inflammation and suspected enlarging contained perforation 2/2 diverticulitis, colitis or neoplasm. No organized fluid collection or pneumoperitoneum. Interval increased gastric distension consistent with an ileus. 4/16 KUB: gas and distended loops of colon 4/23 KUB: decreased prominence of SB loops 4/25 CT A/P: 5.9 cm fluid collection inferior to the gallbladder, possibly hepatic abscess 4/29 CT: extravasated oral contrast vs  hematoma, large abdominal/pelvic fluid collection concerning for gas and possible fistula vs SB perforation; colonic diverticulosis; gaseous distension of SB indicate possible ileus, anasarca  GI Surgeries / Procedures:  4/16 Right colectomy, drainage of abscess 4/17 Re-exploration abdomen, small bowel resection 4/19 Re-exploration abdomen, small bowel resection, creation of end ileostomy, abd closure and placement of wound vac 4/28 NGT removed  4/30 Abd drain placed -Dark bloody output.   Central access: CVC 4/29, temp HD cath placed 4/30  TPN start date: 4/21-4/28; 4/30 >>  Nutritional Goals: Goal rate of 65 mL/hr will provide 109 g protein and 2085 kcal  RD Assessment:  Estimated Needs Total Energy Estimated Needs: 2000-2250 kcals Total Protein Estimated Needs: 100-115 grams Total Fluid Estimated Needs: >/= 2L  Current Nutrition:  NPO and TPN 5/2 CLD  Plan: Adjust concentrated TPN to reduce fluid with reduced Na: goal 65 mL/hr at 1800, meeting 100% of estimated goal Electrolytes in TPN: decrease Na 25 mEq/L, increase K 15 mEq/L, Ca 3 mEq/L, increase Mg 3 mEq/L, and phos 0 mEq/L. Decrease YQ:IHKVQQV to 1:2 Give IV Kcl 30 mEq  Add standard MVI and trace elements to TPN, no chromium  Monitor TPN labs on Mon/Thurs, PRN  F/u dialysis plan  F/u PO intake, wean TPN as able   Thank you for involving pharmacy in this patient's care.  Dorene Gang, PharmD, BCPS, BCCP Clinical Pharmacist  Please check AMION for all [facility] phone numbers After 10:00 PM, call Main Pharmacy [phone]

## 2024-01-31 NOTE — Progress Notes (Addendum)
 13 Days Post-Op  Subjective: He feels good this morning, denies pain.  Objective: Vital signs in last 24 hours: Temp:  [98.1 F (36.7 C)-99.5 F (37.5 C)] 98.7 F (37.1 C) (05/02 0321) Pulse Rate:  [78-92] 78 (05/02 0321) Resp:  [12-19] 17 (05/02 0321) BP: (138-155)/(64-73) 143/66 (05/02 0321) SpO2:  [94 %-96 %] 95 % (05/02 0321) Weight:  [80.1 kg] 80.1 kg (05/02 0500) Last BM Date : 01/30/24  Intake/Output from previous day: 05/01 0701 - 05/02 0700 In: 1624 [I.V.:1554; IV Piggyback:50] Out: 2970 [Urine:2125; Drains:145; Stool:700] Intake/Output this shift: No intake/output data recorded.  PE: Gen: awake, alert Abd: Soft, appropriately tender. Ileostomy viable with thin dark output in bag which is quite full.  Wound with superficial dehiscence and fairly copious seropurulent drainage from multiple areas.  Surgical drain with serous output; IR drain with frankly bloody output, no clear enteric contents  Lab Results:  Recent Labs    01/31/24 0103 01/31/24 0459  WBC 11.2* PATIENT IDENTIFICATION ERROR. PLEASE DISREGARD RESULTS. ACCOUNT WILL BE CREDITED.  HGB 7.4* PATIENT IDENTIFICATION ERROR. PLEASE DISREGARD RESULTS. ACCOUNT WILL BE CREDITED.  HCT 22.5* PATIENT IDENTIFICATION ERROR. PLEASE DISREGARD RESULTS. ACCOUNT WILL BE CREDITED.  PLT 317 PATIENT IDENTIFICATION ERROR. PLEASE DISREGARD RESULTS. ACCOUNT WILL BE CREDITED.   BMET Recent Labs    01/29/24 0449 01/30/24 0445  NA 140 143  K 5.3* 4.1  CL 105 107  CO2 20* 19*  GLUCOSE 90 153*  BUN 87* 89*  CREATININE 6.01* 5.91*  CALCIUM  7.8* 7.6*   PT/INR No results for input(s): "LABPROT", "INR" in the last 72 hours.  CMP     Component Value Date/Time   NA 143 01/30/2024 0445   NA 141 01/22/2020 1137   K 4.1 01/30/2024 0445   CL 107 01/30/2024 0445   CO2 19 (L) 01/30/2024 0445   GLUCOSE 153 (H) 01/30/2024 0445   BUN 89 (H) 01/30/2024 0445   BUN 13 01/22/2020 1137   CREATININE 5.91 (H) 01/30/2024 0445    CREATININE 0.98 06/14/2017 0935   CALCIUM  7.6 (L) 01/30/2024 0445   PROT 5.9 (L) 01/30/2024 0445   PROT 7.3 01/22/2020 1137   ALBUMIN  <1.5 (L) 01/30/2024 0445   ALBUMIN  4.2 01/22/2020 1137   AST 39 01/30/2024 0445   ALT 44 01/30/2024 0445   ALKPHOS 56 01/30/2024 0445   BILITOT 1.4 (H) 01/30/2024 0445   BILITOT 0.4 01/22/2020 1137   GFRNONAA 10 (L) 01/30/2024 0445   GFRNONAA >89 09/17/2014 1253   GFRAA 111 01/22/2020 1137   GFRAA >89 09/17/2014 1253   Lipase     Component Value Date/Time   LIPASE 22 09/28/2019 1109       Studies/Results: IR Fluoro Guide CV Line Right Result Date: 01/29/2024 INDICATION: 63 year old male with history of acute kidney injury requiring central venous access for hemodialysis. EXAM: NON-TUNNELED CENTRAL VENOUS HEMODIALYSIS CATHETER PLACEMENT WITH ULTRASOUND AND FLUOROSCOPIC GUIDANCE COMPARISON:  None Available. MEDICATIONS: None FLUOROSCOPY TIME:  One mGy COMPLICATIONS: None immediate. PROCEDURE: Informed written consent was obtained from the patient after a discussion of the risks, benefits, and alternatives to treatment. Questions regarding the procedure were encouraged and answered. The right neck and chest were prepped with chlorhexidine  in a sterile fashion, and a sterile drape was applied covering the operative field. Maximum barrier sterile technique with sterile gowns and gloves were used for the procedure. A timeout was performed prior to the initiation of the procedure. After the overlying soft tissues were anesthetized, a small venotomy incision  was created and a micropuncture kit was utilized to access the internal jugular vein. Real-time ultrasound guidance was utilized for vascular access including the acquisition of a permanent ultrasound image documenting patency of the accessed vessel. The microwire was utilized to measure appropriate catheter length. A stiff glidewire was advanced to the level of the IVC. Under fluoroscopic guidance, the  venotomy was serially dilated, ultimately allowing placement of a 16 cm temporary Trialysis catheter with tip ultimately terminating within the superior aspect of the right atrium. Final catheter positioning was confirmed and documented with a spot radiographic image. The catheter aspirates and flushes normally. The catheter was flushed with appropriate volume heparin  dwells. The catheter exit site was secured with a 0 silk retention suture. A dressing was placed. The patient tolerated the procedure well without immediate post procedural complication. IMPRESSION: Successful placement of a right internal jugular approach 16 cm temporary dialysis catheter with tip terminating with in the superior aspect of the right atrium. The catheter is ready for immediate use. PLAN: This catheter may be converted to a tunneled dialysis catheter at a later date as indicated. Creasie Doctor, MD [facility] Radiology Electronically Signed   By: Creasie Doctor M.D.   On: 01/29/2024 16:41   IR US  Guide Vasc Access Right Result Date: 01/29/2024 INDICATION: 63 year old male with history of acute kidney injury requiring central venous access for hemodialysis. EXAM: NON-TUNNELED CENTRAL VENOUS HEMODIALYSIS CATHETER PLACEMENT WITH ULTRASOUND AND FLUOROSCOPIC GUIDANCE COMPARISON:  None Available. MEDICATIONS: None FLUOROSCOPY TIME:  One mGy COMPLICATIONS: None immediate. PROCEDURE: Informed written consent was obtained from the patient after a discussion of the risks, benefits, and alternatives to treatment. Questions regarding the procedure were encouraged and answered. The right neck and chest were prepped with chlorhexidine  in a sterile fashion, and a sterile drape was applied covering the operative field. Maximum barrier sterile technique with sterile gowns and gloves were used for the procedure. A timeout was performed prior to the initiation of the procedure. After the overlying soft  tissues were anesthetized, a small venotomy incision was created and a micropuncture kit was utilized to access the internal jugular vein. Real-time ultrasound guidance was utilized for vascular access including the acquisition of a permanent ultrasound image documenting patency of the accessed vessel. The microwire was utilized to measure appropriate catheter length. A stiff glidewire was advanced to the level of the IVC. Under fluoroscopic guidance, the venotomy was serially dilated, ultimately allowing placement of a 16 cm temporary Trialysis catheter with tip ultimately terminating within the superior aspect of the right atrium. Final catheter positioning was confirmed and documented with a spot radiographic image. The catheter aspirates and flushes normally. The catheter was flushed with appropriate volume heparin  dwells. The catheter exit site was secured with a 0 silk retention suture. A dressing was placed. The patient tolerated the procedure well without immediate post procedural complication. IMPRESSION: Successful placement of a right internal jugular approach 16 cm temporary dialysis catheter with tip terminating with in the superior aspect of the right atrium. The catheter is ready for immediate use. PLAN: This catheter may be converted to a tunneled dialysis catheter at a later date as indicated. Creasie Doctor, MD [facility] Radiology Electronically Signed   By: Creasie Doctor M.D.   On: 01/29/2024 16:41   CT GUIDED PERITONEAL/RETROPERITONEAL FLUID DRAIN BY PERC CATH Result Date: 01/29/2024 INDICATION: 63 year old male with postoperative intra-abdominal fluid collection presenting for drainage. EXAM: CT PERC DRAIN PERITONEAL ABCESS COMPARISON:  CT abdomen pelvis from 01/28/2024 MEDICATIONS: The patient is currently admitted to the hospital and receiving intravenous antibiotics. The antibiotics were administered within an appropriate time frame prior to  the initiation of the procedure. ANESTHESIA/SEDATION: Moderate (conscious) sedation was employed during this procedure. A total of Versed  1 2 mg and Fentanyl  75 mcg was administered intravenously. Moderate Sedation Time: 18 minutes. The patient's level of consciousness and vital signs were monitored continuously by radiology nursing throughout the procedure under my direct supervision. CONTRAST:  None COMPLICATIONS: None immediate. PROCEDURE: RADIATION DOSE REDUCTION: This exam was performed according to the departmental dose-optimization program which includes automated exposure control, adjustment of the mA and/or kV according to patient size and/or use of iterative reconstruction technique. Informed written consent was obtained from the patient after a discussion of the risks, benefits and alternatives to treatment. The patient was placed supine on the CT gantry and a pre procedural CT was performed re-demonstrating the known abscess/fluid collection within the midline lower abdomen. The procedure was planned. A timeout was performed prior to the initiation of the procedure. The right midline lower abdomen was prepped and draped in the usual sterile fashion. The overlying soft tissues were anesthetized with 1% lidocaine  with epinephrine . Appropriate trajectory was planned with the use of a 22 gauge spinal needle. An 18 gauge trocar needle was advanced into the abscess/fluid collection and a short Amplatz super stiff wire was coiled within the collection. Appropriate positioning was confirmed with a limited CT scan. The tract was serially dilated allowing placement of a 10 [place] all-purpose drainage catheter. Appropriate positioning was confirmed with a limited postprocedural CT scan. A proximally 100 ml of sanguinous fluid was aspirated. The tube was connected to a bulb suction and sutured in place. A dressing was placed. The patient tolerated the procedure well without immediate post procedural complication.  IMPRESSION: Successful CT guided placement of a 10 French all purpose drain catheter into the midline abdominal fluid collection with aspiration of approximately 100 mL of sanguinous fluid. Samples were sent to the laboratory as requested by the ordering clinical team. Creasie Doctor, MD [facility] Radiology Electronically Signed   By: Creasie Doctor M.D.   On: 01/29/2024 16:39   DG Knee Right Port Result Date: 01/29/2024 CLINICAL DATA:  Right knee pain. EXAM: PORTABLE RIGHT KNEE - 1-2 VIEW COMPARISON:  Right knee radiographs dated 10/24/2012. FINDINGS: No evidence of acute fracture, dislocation, or joint effusion. Mild medial compartment joint space narrowing. Vascular calcifications are noted. IMPRESSION: 1. No acute osseous abnormality. 2. Mild medial compartment joint space narrowing. Electronically Signed   By: Mannie Seek M.D.   On: 01/29/2024 12:09     Anti-infectives: Anti-infectives (From admission, onward)    Start     Dose/Rate Route Frequency Ordered Stop   01/28/24 1000  piperacillin -tazobactam (ZOSYN ) IVPB 2.25 g        2.25 g 100 mL/hr over 30 Minutes Intravenous Every 8 hours 01/28/24 0909     01/18/24 1400  piperacillin -tazobactam (ZOSYN ) IVPB 3.375 g  Status:  Discontinued        3.375 g 12.5 mL/hr over 240 Minutes Intravenous Every 6 hours 01/18/24 1241 01/18/24 1242   01/18/24 1400  piperacillin -tazobactam (ZOSYN ) IVPB 3.375 g        3.375 g 100 mL/hr over 30 Minutes Intravenous Every 6 hours 01/18/24 1242 01/23/24 2046   01/18/24 0000  piperacillin -tazobactam (ZOSYN ) IVPB 2.25 g  Status:  Discontinued  2.25 g 100 mL/hr over 30 Minutes Intravenous Every 6 hours 01/17/24 2120 01/18/24 1241   01/12/24 0200  piperacillin -tazobactam (ZOSYN ) IVPB 3.375 g  Status:  Discontinued        3.375 g 12.5 mL/hr over 240 Minutes Intravenous Every 8 hours 01/11/24 2120 01/17/24 2120   01/11/24 1815  piperacillin -tazobactam  (ZOSYN ) IVPB 3.375 g        3.375 g 100 mL/hr over 30 Minutes Intravenous  Once 01/11/24 1806 01/11/24 1921        Assessment/Plan LAPAROTOMY, EXPLORATORY RIGHT COLECTOMY (without anastomosis) PLACEMENT OF ABTHERA WOUND VAC DRAINAGE OF INTRA-ABDOMINAL ABSCESS  4/16 EW   RE-EXPLORATION ABDOMEN, PLACEMENT OF ABTHERA WOUND VAC, SMALL BOWEL RESECTION 01/16/2024 EW   Re-exploration abdomen, small bowel resection, creation of end ileostomy, abd closure, placement wound vac 4/19 EW  - TNA for nutritional support.  Cont NPO for right now.  CT scan was certainly concerning for leak given presumed extravasation of contrast noted in this fluid collection.  Drains is bloody right now with no succus noted.  Will go ahead and let him have clear liquids today. -IR drain placed in suspect abscess/leak 4/30-output has been bloody but not purulent or enteric per se - Cont to monitor ostomy output - Completed post op abx, but WBC increasing, tachy, lethargic 4/29, with increasing temp-Zosyn  restarted  WBC much improved and down to 11.2K today   FEN -trial clear liquids today, fibercon added for thin high ostomy output VTE - SCDs, heparin  gtt held due to anemia ID - Completed post op abx. reinititiation of Zosyn  on 4/29  - Per TRH -  Pleural effusions Septic shock - resolved ABL anemia - monitor and transfuse as needed  Coagulopathy - resolved AKI - started CRRT 4/19,  defer to renal.  Holding HD as UOP is good and Cr improving Acute hypoxemic resp failure - extubated H/o VTE/PE/DVT - scds for now; was on coumadin      LOS: 20 days    Adalberto Acton ,MD [name] 01/31/2024, 7:36 AM Please see Amion for pager number during day hours 7:00am-4:30pm or 7:00am -11:30am on weekends
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
## 2024-01-31 NOTE — Progress Notes (Signed)
 Referring Physician(s): Marlin Simmonds, PA-C  Supervising Physician: Marland Silvas  Patient Status:  [facility] - In-pt  Chief Complaint:   S/p intra-abd fluid collection drain placement 4/30 by Dr. Jinx Mourning   Brief History of Present Illness: Alexander [name] is a 63 y.o. male with medical history significant of Pe/DVT on coumadin , diastolic CHF.  This patient was admitted 01/11/2024 for GI bleeding.   4/16- He underwent ex lap where he was found to have perforated ascending colon with intra abd abscess. Right colectomy without anastomosis, placement of wound VAC, and drainage of intra-abdominal abscesses. Returned to ICU on vent with cardiorespiratory arrest same day.  ICU care included blood products and pressors. 4/19- he returned to the OR for ex lap with ileostomy creation, small bowel resection, wound VAC. Was started on CRRT. 4/23- extubated, off pressors 4/25- transferred to [facility] for HD 4/26- IR consulted for 4/25 CT showing fluid collection adjacent to drain, IR recommended monitoring status and re-imaging if not improved 4/29- Noted lethargy and confusion overnight with persistent leukocytosis. CCS consulted, zosyn  restarted and repeat CT ordered. R IJ CV and right femoral HD removed.  CT resulted today with new 10.9 x 15.8 x 13.1 cm intra-abd fluid collection that appears to contain enteric contrast and ingested material. IR consulted for fluid collection drain and triple lumen CVC placement. Patient allowed line placement by Dr. Mabel Savage but refused drain placement 4/30- After speaking with son, patient agreed to drain placement and HD cath which was completed by Dr. Jinx Mourning with dark, bloody output at that time. 5/1- Overnight, copious output from drain (960cc), no longer tachycardic (80s), WBC tending down (19.7>15.3> 12.6 today)  Subjective:  Patient is lying bed, alert, eager to participate in PT, agreeable for drain exam today. Tolerating advancing diet to clear liquids well.    Allergies: Patient has no known allergies.  Medications: Prior to Admission medications   Medication Sig Start Date End Date Taking? Authorizing Provider  acetaminophen  (TYLENOL ) 325 MG tablet Take 2 tablets (650 mg total) by mouth every 6 (six) hours as needed for mild pain (pain score 1-3) (or Fever >/= 101). 07/23/23  Yes Sheikh, Omair Latif, DO  warfarin (COUMADIN ) 5 MG tablet TAKE 2 TABLETS BY MOUTH DAILY OR AS DIRECTED BY ANTICOAGULATION CLINIC Patient taking differently: Take 10 mg by mouth daily at 4 PM. 12/24/23  Yes Alexander Iba, PA  mupirocin  ointment (BACTROBAN ) 2 % Apply to affected area 1-2 times daily Patient not taking: Reported on 01/11/2024 12/03/23   Worley, Samantha, PA  polyethylene glycol powder (GLYCOLAX /MIRALAX ) 17 GM/SCOOP powder Mix as directed and take 17 g by mouth daily. Patient not taking: Reported on 01/11/2024 07/23/23   Eveline Hipps, DO     Vital Signs: BP (!) 147/71 (BP Location: Right Arm)   Pulse 89   Temp 99.2 F (37.3 C) (Oral)   Resp 10   Ht 5\' 9"  (1.753 m)   Wt 176 lb 9.4 oz (80.1 kg)   SpO2 95%   BMI 26.08 kg/m   Physical Exam Cardiovascular:     Rate and Rhythm: Normal rate.  Pulmonary:     Effort: Pulmonary effort is normal.  Abdominal:     Palpations: Abdomen is soft.     Comments: Insertion site of IR drain unremarkable, bloody output in charged bulb, no leakage around IR tube at insertion site.  Midline dressing C/D/I Surgical drain present with serous output  Skin:    General: Skin is warm and dry.  Neurological:  Mental Status: He is alert.     Imaging: IR Fluoro Guide CV Line Right Result Date: 01/29/2024 INDICATION: 63 year old male with history of acute kidney injury requiring central venous access for hemodialysis. EXAM: NON-TUNNELED CENTRAL VENOUS HEMODIALYSIS CATHETER PLACEMENT WITH ULTRASOUND AND FLUOROSCOPIC GUIDANCE COMPARISON:  None Available. MEDICATIONS: None FLUOROSCOPY TIME:  One mGy  COMPLICATIONS: None immediate. PROCEDURE: Informed written consent was obtained from the patient after a discussion of the risks, benefits, and alternatives to treatment. Questions regarding the procedure were encouraged and answered. The right neck and chest were prepped with chlorhexidine  in a sterile fashion, and a sterile drape was applied covering the operative field. Maximum barrier sterile technique with sterile gowns and gloves were used for the procedure. A timeout was performed prior to the initiation of the procedure. After the overlying soft tissues were anesthetized, a small venotomy incision was created and a micropuncture kit was utilized to access the internal jugular vein. Real-time ultrasound guidance was utilized for vascular access including the acquisition of a permanent ultrasound image documenting patency of the accessed vessel. The microwire was utilized to measure appropriate catheter length. A stiff glidewire was advanced to the level of the IVC. Under fluoroscopic guidance, the venotomy was serially dilated, ultimately allowing placement of a 16 cm temporary Trialysis catheter with tip ultimately terminating within the superior aspect of the right atrium. Final catheter positioning was confirmed and documented with a spot radiographic image. The catheter aspirates and flushes normally. The catheter was flushed with appropriate volume heparin  dwells. The catheter exit site was secured with a 0 silk retention suture. A dressing was placed. The patient tolerated the procedure well without immediate post procedural complication. IMPRESSION: Successful placement of a right internal jugular approach 16 cm temporary dialysis catheter with tip terminating with in the superior aspect of the right atrium. The catheter is ready for immediate use. PLAN: This catheter may be converted to a tunneled dialysis catheter at a later date as indicated. Creasie Doctor, MD Vascular and Interventional Radiology  Specialists [facility] Radiology Electronically Signed   By: Creasie Doctor M.D.   On: 01/29/2024 16:41   IR US  Guide Vasc Access Right Result Date: 01/29/2024 INDICATION: 63 year old male with history of acute kidney injury requiring central venous access for hemodialysis. EXAM: NON-TUNNELED CENTRAL VENOUS HEMODIALYSIS CATHETER PLACEMENT WITH ULTRASOUND AND FLUOROSCOPIC GUIDANCE COMPARISON:  None Available. MEDICATIONS: None FLUOROSCOPY TIME:  One mGy COMPLICATIONS: None immediate. PROCEDURE: Informed written consent was obtained from the patient after a discussion of the risks, benefits, and alternatives to treatment. Questions regarding the procedure were encouraged and answered. The right neck and chest were prepped with chlorhexidine  in a sterile fashion, and a sterile drape was applied covering the operative field. Maximum barrier sterile technique with sterile gowns and gloves were used for the procedure. A timeout was performed prior to the initiation of the procedure. After the overlying soft tissues were anesthetized, a small venotomy incision was created and a micropuncture kit was utilized to access the internal jugular vein. Real-time ultrasound guidance was utilized for vascular access including the acquisition of a permanent ultrasound image documenting patency of the accessed vessel. The microwire was utilized to measure appropriate catheter length. A stiff glidewire was advanced to the level of the IVC. Under fluoroscopic guidance, the venotomy was serially dilated, ultimately allowing placement of a 16 cm temporary Trialysis catheter with tip ultimately terminating within the superior aspect of the right atrium. Final catheter positioning was confirmed and documented with a spot radiographic image.  The catheter aspirates and flushes normally. The catheter was flushed with appropriate volume heparin  dwells. The catheter exit site was secured with a 0 silk retention suture. A dressing was placed.  The patient tolerated the procedure well without immediate post procedural complication. IMPRESSION: Successful placement of a right internal jugular approach 16 cm temporary dialysis catheter with tip terminating with in the superior aspect of the right atrium. The catheter is ready for immediate use. PLAN: This catheter may be converted to a tunneled dialysis catheter at a later date as indicated. Creasie Doctor, MD Vascular and Interventional Radiology Specialists [facility] Radiology Electronically Signed   By: Creasie Doctor M.D.   On: 01/29/2024 16:41   CT GUIDED PERITONEAL/RETROPERITONEAL FLUID DRAIN BY PERC CATH Result Date: 01/29/2024 INDICATION: 63 year old male with postoperative intra-abdominal fluid collection presenting for drainage. EXAM: CT PERC DRAIN PERITONEAL ABCESS COMPARISON:  CT abdomen pelvis from 01/28/2024 MEDICATIONS: The patient is currently admitted to the hospital and receiving intravenous antibiotics. The antibiotics were administered within an appropriate time frame prior to the initiation of the procedure. ANESTHESIA/SEDATION: Moderate (conscious) sedation was employed during this procedure. A total of Versed  1 2 mg and Fentanyl  75 mcg was administered intravenously. Moderate Sedation Time: 18 minutes. The patient's level of consciousness and vital signs were monitored continuously by radiology nursing throughout the procedure under my direct supervision. CONTRAST:  None COMPLICATIONS: None immediate. PROCEDURE: RADIATION DOSE REDUCTION: This exam was performed according to the departmental dose-optimization program which includes automated exposure control, adjustment of the mA and/or kV according to patient size and/or use of iterative reconstruction technique. Informed written consent was obtained from the patient after a discussion of the risks, benefits and alternatives to treatment. The patient was placed supine on the CT gantry and a pre procedural CT was performed  re-demonstrating the known abscess/fluid collection within the midline lower abdomen. The procedure was planned. A timeout was performed prior to the initiation of the procedure. The right midline lower abdomen was prepped and draped in the usual sterile fashion. The overlying soft tissues were anesthetized with 1% lidocaine  with epinephrine . Appropriate trajectory was planned with the use of a 22 gauge spinal needle. An 18 gauge trocar needle was advanced into the abscess/fluid collection and a short Amplatz super stiff wire was coiled within the collection. Appropriate positioning was confirmed with a limited CT scan. The tract was serially dilated allowing placement of a 10 [place] all-purpose drainage catheter. Appropriate positioning was confirmed with a limited postprocedural CT scan. A proximally 100 ml of sanguinous fluid was aspirated. The tube was connected to a bulb suction and sutured in place. A dressing was placed. The patient tolerated the procedure well without immediate post procedural complication. IMPRESSION: Successful CT guided placement of a 10 French all purpose drain catheter into the midline abdominal fluid collection with aspiration of approximately 100 mL of sanguinous fluid. Samples were sent to the laboratory as requested by the ordering clinical team. Creasie Doctor, MD Vascular and Interventional Radiology Specialists [facility] Radiology Electronically Signed   By: Creasie Doctor M.D.   On: 01/29/2024 16:39   DG Knee Right Port Result Date: 01/29/2024 CLINICAL DATA:  Right knee pain. EXAM: PORTABLE RIGHT KNEE - 1-2 VIEW COMPARISON:  Right knee radiographs dated 10/24/2012. FINDINGS: No evidence of acute fracture, dislocation, or joint effusion. Mild medial compartment joint space narrowing. Vascular calcifications are noted. IMPRESSION: 1. No acute osseous abnormality. 2. Mild medial compartment joint space narrowing. Electronically Signed   By: Arcola Beards.D.  On: 01/29/2024  12:09   IR Fluoro Guide CV Line Right Result Date: 01/28/2024 INDICATION: 63 year old male referred for non tunneled central venous catheter EXAM: IMAGE GUIDED NON TUNNELED CENTRAL VENOUS CATHETER MEDICATIONS: None ANESTHESIA/SEDATION: Moderate (conscious) sedation was not employed during this procedure. FLUOROSCOPY: Radiation Exposure Index (as provided by the fluoroscopic device): 10.1 mGy Kerma COMPLICATIONS: None PROCEDURE: Informed written consent was obtained from the patient's family after a discussion of the risks, benefits, and alternatives to treatment. Questions regarding the procedure were encouraged and answered. The right neck was prepped with chlorhexidine  in a sterile fashion, and a sterile drape was applied covering the operative field. Maximum barrier sterile technique with sterile gowns and gloves were used for the procedure. A timeout was performed prior to the initiation of the procedure. A micropuncture kit was utilized to access the right internal jugular vein under direct, real-time ultrasound guidance after the overlying soft tissues were anesthetized with 1% lidocaine  with epinephrine . Ultrasound image documentation was performed. Wire was advanced to the level of the IVC. A triple-lumen standard 30 cm central venous catheter was advanced on the wire, with the tip position at the level of the right atrium. Final catheter positioning was confirmed and documented with a spot radiographic image. The catheter aspirates and flushes normally. The catheter was flushed with appropriate volume heparin  dwells. Dressings were applied. The patient tolerated the procedure well without immediate post procedural complication. IMPRESSION: Status post image guided right IJ non tunneled central venous catheter. Signed, Marciano Settles. Rexine Cater, RPVI Vascular and Interventional Radiology Specialists [facility] Radiology Electronically Signed   By: Myrlene Asper D.O.   On: 01/28/2024 16:15   IR US  Guide  Vasc Access Right RESULT     INDICATION: 63 year old male with history of acute kidney injury requiring central venous access for hemodialysis. EXAM: NON-TUNNELED CENTRAL VENOUS HEMODIALYSIS CATHETER PLACEMENT WITH ULTRASOUND AND FLUOROSCOPIC GUIDANCE COMPARISON:  None Available. MEDICATIONS: None FLUOROSCOPY TIME:  One mGy COMPLICATIONS: None immediate. PROCEDURE: Informed written consent was obtained from the patient after a discussion of the risks, benefits, and alternatives to treatment. Questions regarding the procedure were encouraged and answered. The right neck and chest were prepped with chlorhexidine  in a sterile fashion, and a sterile drape was applied covering the operative field. Maximum barrier sterile technique with sterile gowns and gloves were used for the procedure. A timeout was performed prior to the initiation of the procedure. After the overlying soft tissues were anesthetized, a small venotomy incision was created and a micropuncture kit was utilized to access the internal jugular vein. Real-time ultrasound guidance was utilized for vascular access including the acquisition of a permanent ultrasound image documenting patency of the accessed vessel. The microwire was utilized to measure appropriate catheter length. A stiff glidewire was advanced to the level of the IVC. Under fluoroscopic guidance, the venotomy was serially dilated, ultimately allowing placement of a 16 cm temporary Trialysis catheter with tip ultimately terminating within the superior aspect of the right atrium. Final catheter positioning was confirmed and documented with a spot radiographic image. The catheter aspirates and flushes normally. The catheter was flushed with appropriate volume heparin  dwells. The catheter exit site was secured with a 0 silk retention suture. A dressing was placed. The patient tolerated the procedure well without immediate post procedural complication. IMPRESSION: Successful placement of a right  internal jugular approach 16 cm temporary dialysis catheter with tip terminating with in the superior aspect of the right atrium. The catheter is ready for immediate use. PLAN:  This catheter may be converted to a tunneled dialysis catheter at a later date as indicated. Creasie Doctor, MD Vascular and Interventional Radiology Specialists [facility] Radiology Electronically Signed   By: Creasie Doctor M.D.   On: 01/29/2024 16:41   CT ABDOMEN PELVIS WO CONTRAST Result Date: 01/28/2024 CLINICAL DATA:  Abdominal pain, post-op eval for infection, post op complication EXAM: CT ABDOMEN AND PELVIS WITHOUT CONTRAST TECHNIQUE: Multidetector CT imaging of the abdomen and pelvis was performed following the standard protocol without IV contrast. RADIATION DOSE REDUCTION: This exam was performed according to the departmental dose-optimization program which includes automated exposure control, adjustment of the mA and/or kV according to patient size and/or use of iterative reconstruction technique. COMPARISON:  January 24, 2024, January 14, 2024, January 11, 2024 FINDINGS: Of note, the lack of intravenous contrast limits evaluation of the solid organ parenchyma and vascularity. The study is degraded by patient's arm positioning. Lower chest: Patchy ground-glass airspace opacities in the visualized lungs. Small bilateral pleural effusions with bibasilar compressive atelectasis, larger on the right than the left. Hepatobiliary: No mass. No radiopaque stones or wall thickening of the gallbladder. No intrahepatic or extrahepatic biliary ductal dilation. Pancreas: No mass or main ductal dilation. No peripancreatic inflammation or fluid collection. Spleen: Normal size. No mass. Adrenals/Urinary Tract: No adrenal masses. No renal mass. No hydronephrosis or nephrolithiasis. Mostly decompressed urinary bladder with urinary catheter in place. Stomach/Bowel: The stomach contains ingested material without focal abnormality. Mild diffuse wall  thickening throughout the small bowel. Gaseous distension of a short segment of small bowel measuring 4 cm in the upper abdomen. Right lower quadrant diverting ileostomy with an extended right hemicolectomy. Descending and sigmoid colonic diverticulosis. Vascular/Lymphatic: No aortic aneurysm. Scattered aortoiliac atherosclerosis. No intraabdominal or pelvic lymphadenopathy. Reproductive: No prostatomegaly. No free pelvic fluid. Other: There is a large, thick-walled fluid collection in the lower abdomen and pelvis, measuring 10.9 x 15.8 x 13.1 cm. Hyperdense layering material present within this fluid collection, which may represent ingested material or hemorrhage. Multiple locules of gas noted along the ventral incision (axial 55-70), extending into the midline incision subcutaneous fat, worrisome for extraluminal gas with possible fistulous communication. Diffuse mesenteric edema with layering ascites in the left paracolic gutter. There is also loculated fluid in the right paracolic gutter. Postsurgical drainage catheter extending from the left lower quadrant across the upper abdomen and terminating in the right paracolic gutter. Musculoskeletal: No acute fracture or destructive lesion. Multilevel degenerative disc disease of the spine. 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 Diffuse anasarca. IMPRESSION: 1. Large, thick-walled fluid collection in the lower abdomen and pelvis, measuring 10.9 x 15.8 x 13.1 cm (APxTRxCC). Hyperdense material in this collection likely reflects a combination of ingested material and enteric contrast from the prior CT rather than or hemorrhage. Given these findings, this is worrisome for underlying small-bowel perforation. 2. Moderate wall thickening throughout the majority of the small bowel, likely reactive to the intra-abdominal inflammation. Additionally, gaseous distension of a couple of  segments of small bowel in the upper abdomen, possibly reflect changes of a reactive ileus. 3. Increasing locules of gas subjacent to the midline ventral incision (axial 55-70), extending the peritoneum to the subcutaneous fat, worrisome for extraluminal gas with possible fistulous communication. 4. Patchy ground-glass airspace opacities in the visualized lungs, which may represent underlying viral pneumonia or early pulmonary edema. Small bilateral pleural effusions, larger on the right than the left. 5. Diffuse anasarca. Critical Value/emergent results  were called by telephone at the time of interpretation on 01/28/2024 at 11:21 am to provider [facility] , who verbally acknowledged these results. Electronically Signed   By: Rance Burrows M.D.   On: 01/28/2024 11:30    Labs:  CBC: Recent Labs    01/30/24 1935 01/31/24 0103 01/31/24 0459 01/31/24 0706  WBC 11.5* 11.2* PATIENT IDENTIFICATION ERROR. PLEASE DISREGARD RESULTS. ACCOUNT WILL BE CREDITED. 10.2  HGB 7.4* 7.4* PATIENT IDENTIFICATION ERROR. PLEASE DISREGARD RESULTS. ACCOUNT WILL BE CREDITED. 7.3*  HCT 22.2* 22.5* PATIENT IDENTIFICATION ERROR. PLEASE DISREGARD RESULTS. ACCOUNT WILL BE CREDITED. 22.4*  PLT [phone] PATIENT IDENTIFICATION ERROR. PLEASE DISREGARD RESULTS. ACCOUNT WILL BE CREDITED. 294    COAGS: Recent Labs    01/20/24 0427 01/21/24 0504 01/22/24 0417 01/23/24 0505 01/24/24 0552 01/26/24 0610 01/27/24 0045  INR 1.0 1.0  --   --  1.1 1.2 1.2  APTT 33 34  34 33 46*  --   --   --     BMP: Recent Labs    01/28/24 1653 01/29/24 0449 01/30/24 0445 01/31/24 0706  NA 140 140 143 148*  K 5.3* 5.3* 4.1 3.2*  CL 104 105 107 111  CO2 21* 20* 19* 25  GLUCOSE 81 90 153* 157*  BUN 81* 87* 89* 86*  CALCIUM  7.9* 7.8* 7.6* 7.5*  CREATININE 5.79* 6.01* 5.91* 5.67*  GFRNONAA 10* 10* 10* 11*    LIVER FUNCTION TESTS: Recent Labs    01/23/24 0505 01/24/24 0552 01/25/24 0540 01/27/24 0045 01/28/24 1653  01/30/24 0445 01/31/24 0706  BILITOT 2.2* 1.8*  --   --  1.5* 1.4*  --   AST 57* 41  --   --  33 39  --   ALT 179* 118*  --   --  49* 44  --   ALKPHOS 69 63  --   --  65 56  --   PROT 5.4* 5.4*  --   --  5.8* 5.9*  --   ALBUMIN  1.7* 1.5*  1.6*   < > <1.5* <1.5* <1.5* <1.5*   < > = values in this interval not displayed.    Assessment and Plan:  S/p intra abdominal fluid collection drain placement 4/30 by Dr. Jinx Mourning -cultures pending -advancing diet per GI, appears to be tolerating -WBC continuing to improve 10.2 today from 11.5 on 5/1, 15.3 4/30 -Hgb value stable 7.3<7.4 with continued bloody output -on zosyn  -drain site unremarkable, concerns with loss of bulb charge appear resolved -Flushes/aspirates easily. Suture and stat lock in place.Dressed appropriately.   Drain Location: R of midline surgical wound Size: Fr size: 10 Fr Date of placement: 01/29/24  Currently to: Drain collection device: suction bulb 24 hour output:  Output by Drain (mL) 01/29/24 0701 - 01/29/24 1900 01/29/24 1901 - 01/30/24 0700 01/30/24 0701 - 01/30/24 1900 01/30/24 1901 - 01/31/24 0700 01/31/24 0701 - 01/31/24 1304  Closed System Drain LLQ Bulb (JP) 19 Fr.    5 0  Closed System Drain 2 Midline Abdomen Bulb (JP) 10 Fr. 250 710 90 50 30     Plan: Continue TID flushes with 5 cc NS. Record output Q shift. Dressing changes QD or PRN if soiled.  Call IR APP or on call IR MD if difficulty flushing or sudden change in drain output.  Repeat imaging/possible drain injection once output < 10 mL/QD (excluding flush material). Consideration for drain removal if output is < 10 mL/QD (excluding flush material), pending discussion with the providing surgical  service. Drain will likely be long-term based on high suspect of communication with bowel.  Discharge planning: Please contact IR APP or on call IR MD prior to patient d/c to ensure appropriate follow up plans are in place. Typically patient will follow up with  IR clinic 10-14 days post d/c for repeat imaging/possible drain injection. IR scheduler will contact patient with date/time of appointment. Patient will need to flush drain QD with 5 cc NS, record output QD, dressing changes every 2-3 days or earlier if soiled.   IR will continue to follow - please call with questions or concerns.    Electronically Signed: Terressa Fess, NP 01/31/2024, 11:40 AM   I spent a total of 25 Minutes at the the patient's bedside AND on the patient's hospital floor or unit, greater than 50% of which was counseling/coordinating care for follow up of intra-abd fluid collection drain

## 2024-01-31 NOTE — Progress Notes (Addendum)
 Nutrition Brief Note  Patient has been on TPN since 4/30. Noted to have been advanced to clear liquid diet today. Consumed approximately 25% x1 meal documented.  Estimated Nutritional Needs:  Kcal:  2000-2250 kcals Protein:  100-115 grams Fluid:  >/= 2L  Fluid content of TPN formula reduced today d/t drop in sodium. Potassium and magnesium  content increased and KCl ordered.   Ostomy output remains high w/ documented yesterday and already 1.9L today. Fibercon ordered to aid in stool bulking. He is at elevated risk for sodium depletion w/ high ostomy output. Also not appropriate for Boost Breeze at this time as it is too hypertonic and can increase ostomy output even further.   Will continue to monitor nutrition status/ostomy output as diet advanced.     INTERVENTION:  TPN management per pharmacy  Currently providing: 2085 kcals (104% of minimum estimated needs) and 109g protein (109% of minimum estimated needs)   Daily weights while on TPN Monitor for diet advancement and tolerance Continue Fibercon r/t high ostomy output  Wt Readings from Last 15 Encounters:  01/31/24 80.1 kg  12/04/23 80.7 kg  12/03/23 79 kg  07/24/23 83.2 kg  07/19/23 86.2 kg  05/14/22 86.2 kg  03/26/22 86.2 kg  10/30/21 89.4 kg  06/01/21 91.7 kg  03/13/21 89.2 kg  02/20/21 88.9 kg  12/07/20 89 kg  09/28/20 87.7 kg  09/09/20 88.5 kg  08/12/20 86.2 kg    NUTRITION DIAGNOSIS:  Inadequate oral intake related to inability to eat as evidenced by NPO status. - remains applicable   GOAL:  Patient will meet greater than or equal to 90% of their needs - to be met via parenteral nutrition   MONITOR:  Vent status, Labs, Weight trends    Con Decant MS, RD, LDN Registered Dietitian Clinical Nutrition RD Inpatient Contact Info in Amion

## 2024-01-31 NOTE — Plan of Care (Signed)
  Problem: Education: Goal: Knowledge of General Education information will improve Description: Including pain rating scale, medication(s)/side effects and non-pharmacologic comfort measures Outcome: Progressing   Problem: Health Behavior/Discharge Planning: Goal: Ability to manage health-related needs will improve Outcome: Progressing   Problem: Clinical Measurements: Goal: Ability to maintain clinical measurements within normal limits will improve Outcome: Progressing Goal: Will remain free from infection Outcome: Progressing Goal: Diagnostic test results will improve Outcome: Progressing Goal: Respiratory complications will improve Outcome: Progressing Goal: Cardiovascular complication will be avoided Outcome: Progressing   Problem: Elimination: Goal: Will not experience complications related to bowel motility Outcome: Progressing Goal: Will not experience complications related to urinary retention Outcome: Progressing   Problem: Pain Managment: Goal: General experience of comfort will improve and/or be controlled Outcome: Progressing   Problem: Skin Integrity: Goal: Risk for impaired skin integrity will decrease Outcome: Progressing   Problem: Activity: Goal: Ability to tolerate increased activity will improve Outcome: Progressing   Problem: Respiratory: Goal: Ability to maintain a clear airway and adequate ventilation will improve Outcome: Progressing   Problem: Role Relationship: Goal: Method of communication will improve Outcome: Progressing   Problem: Fluid Volume: Goal: Ability to maintain a balanced intake and output will improve Outcome: Progressing   Problem: Health Behavior/Discharge Planning: Goal: Ability to identify and utilize available resources and services will improve Outcome: Progressing Goal: Ability to manage health-related needs will improve Outcome: Progressing   Problem: Metabolic: Goal: Ability to maintain appropriate glucose  levels will improve Outcome: Progressing   Problem: Nutritional: Goal: Maintenance of adequate nutrition will improve Outcome: Progressing Goal: Progress toward achieving an optimal weight will improve Outcome: Progressing   Problem: Skin Integrity: Goal: Risk for impaired skin integrity will decrease Outcome: Progressing   Problem: Tissue Perfusion: Goal: Adequacy of tissue perfusion will improve Outcome: Progressing   Problem: Safety: Goal: Non-violent Restraint(s) Outcome: Progressing

## 2024-01-31 NOTE — Progress Notes (Signed)
 [facility] NEPHROLOGY PROGRESS NOTE  Assessment/ Plan: Pt is a 63 y.o. yo male with past medical history of DVT, PE on Coumadin  admitted on 4/12 with rectal bleed and hypotension seen for AKI.  # AKI - mostly anuric, creat 1.0 early in this hospital stay. After onset of severe shock 4/16 pt had progressive AKI. UA negative. Renal US  shows no obstruction. AKI due to ATN primarily due to shock +/- IV contrast. Appreciate RIJ temp cath by VIR for temp cath on 4/30. CRRT from 4/19-4/24.  Last HD on 4/27.   Urine output has been very good for the past 2 days; no labs yet for today but will follow-up.  If there are no absolute indications we will hold dialysis decision till Saturday.    #S/P cardiac arrest: 4/17  #Shock/ hypotension: Off of pressors now.  # Acute diverticulitis: w/ perforated ascending colon, s/p ileostomy, R hemicolectomy and some small bowel resection. Per gen surg/ pmd. New fluid collection in abd but pt refused abd drain.   On TPN  #VDRF: Improved now extubated   Subjective: Wants ginger ale; he has been okayed for 3 ice chips.   Denies nausea, shortness of breath  Last  dialysis on 4/27.  Objective Vital signs in last 24 hours: Vitals:   01/30/24 2013 01/30/24 2343 01/31/24 0321 01/31/24 0500  BP: (!) 155/67 (!) 142/64 (!) 143/66   Pulse: 92 85 78   Resp: 14 19 17    Temp: 99.5 F (37.5 C) 98.1 F (36.7 C) 98.7 F (37.1 C)   TempSrc: Axillary Oral Oral   SpO2: 96% 94% 95%   Weight:    80.1 kg  Height:       Weight change: -0.2 kg  Intake/Output Summary (Last 24 hours) at 01/31/2024 0713 Last data filed at 01/31/2024 0514 Gross per 24 hour  Intake 1624 ml  Output 2970 ml  Net -1346 ml       Labs: RENAL PANEL Recent Labs  Lab 01/25/24 0540 01/26/24 0610 01/27/24 0045 01/28/24 1653 01/29/24 0449 01/30/24 0445 01/31/24 0459  NA 140 142 139 140 140 143  --   K 3.6 3.4* 3.3* 5.3* 5.3* 4.1  --   CL 106 106 103 104 105 107  --    CO2 22 20* 25 21* 20* 19*  --   GLUCOSE 153* 152* 108* 81 90 153*  --   BUN 81* 97* 55* 81* 87* 89*  --   CREATININE 5.21* 6.11* 3.91* 5.79* 6.01* 5.91*  --   CALCIUM  7.6* 7.7* 7.8* 7.9* 7.8* 7.6*  --   MG 2.2 2.1 1.7 2.0  --  1.9 PATIENT IDENTIFICATION ERROR. PLEASE DISREGARD RESULTS. ACCOUNT WILL BE CREDITED.  PHOS 4.7* 4.5 2.3*  --  8.5* 6.2*  --   ALBUMIN  <1.5* <1.5* <1.5* <1.5*  --  <1.5*  --     Liver Function Tests: Recent Labs  Lab 01/27/24 0045 01/28/24 1653 01/30/24 0445  AST  --  33 39  ALT  --  49* 44  ALKPHOS  --  65 56  BILITOT  --  1.5* 1.4*  PROT  --  5.8* 5.9*  ALBUMIN  <1.5* <1.5* <1.5*   No results for input(s): "LIPASE", "AMYLASE" in the last 168 hours. No results for input(s): "AMMONIA" in the last 168 hours. CBC: Recent Labs    07/19/23 0448 07/22/23 0441 12/03/23 1437 12/04/23 1531 12/05/23 0036 01/11/24 1458 01/11/24 1957 01/12/24 0056 01/12/24 1044 01/12/24 1552 01/30/24 0445 01/30/24 1000  01/30/24 1935 01/31/24 0103 01/31/24 0459  HGB 10.1*   < > 7.6 Repeated and verified X2.*   < >  --    < > 6.7*   < >  --    < > 7.5* 7.3* 7.4* 7.4* PATIENT IDENTIFICATION ERROR. PLEASE DISREGARD RESULTS. ACCOUNT WILL BE CREDITED.  MCV 92.7   < > 75.5*   < >  --    < > 76.6*   < >  --    < > 88.5 89.8 88.4 90.7 PATIENT IDENTIFICATION ERROR. PLEASE DISREGARD RESULTS. ACCOUNT WILL BE CREDITED.  VITAMINB12 222  --   --   --  100*  --   --   --  93*  --   --   --   --   --   --   FOLATE 7.5  --   --   --  8.0  --   --   --  9.3  --   --   --   --   --   --   FERRITIN 25  --  4.5*  --  3*  --  19*  --   --   --   --   --   --   --   --   TIBC 328  --  382.2  --  364  --  293  --   --   --   --   --   --   --   --   IRON  25*  --  15*  --  11*  --  11*  --   --   --   --   --   --   --   --   RETICCTPCT 1.4  --   --   --  0.7  --  0.8  --   --   --   --   --   --   --   --    < > = values in this interval not displayed.    Cardiac Enzymes: No results for  input(s): "CKTOTAL", "CKMB", "CKMBINDEX", "TROPONINI" in the last 168 hours. CBG: Recent Labs  Lab 01/30/24 0747 01/30/24 1139 01/30/24 1526 01/30/24 2007 01/30/24 2342  GLUCAP 144* 155* 142* 130* 133*    Iron  Studies: No results for input(s): "IRON ", "TIBC", "TRANSFERRIN", "FERRITIN" in the last 72 hours. Studies/Results: IR Fluoro Guide CV Line Right Result Date: 01/29/2024 INDICATION: 63 year old male with history of acute kidney injury requiring central venous access for hemodialysis. EXAM: NON-TUNNELED CENTRAL VENOUS HEMODIALYSIS CATHETER PLACEMENT WITH ULTRASOUND AND FLUOROSCOPIC GUIDANCE COMPARISON:  None Available. MEDICATIONS: None FLUOROSCOPY TIME:  One mGy COMPLICATIONS: None immediate. PROCEDURE: Informed written consent was obtained from the patient after a discussion of the risks, benefits, and alternatives to treatment. Questions regarding the procedure were encouraged and answered. The right neck and chest were prepped with chlorhexidine  in a sterile fashion, and a sterile drape was applied covering the operative field. Maximum barrier sterile technique with sterile gowns and gloves were used for the procedure. A timeout was performed prior to the initiation of the procedure. After the overlying soft tissues were anesthetized, a small venotomy incision was created and a micropuncture kit was utilized to access the internal jugular vein. Real-time ultrasound guidance was utilized for vascular access including the acquisition of a permanent ultrasound image documenting patency of the accessed vessel. The microwire was utilized to measure appropriate catheter length. A stiff glidewire was advanced to the  level of the IVC. Under fluoroscopic guidance, the venotomy was serially dilated, ultimately allowing placement of a 16 cm temporary Trialysis catheter with tip ultimately terminating within the superior aspect of the right atrium. Final catheter positioning was confirmed and documented  with a spot radiographic image. The catheter aspirates and flushes normally. The catheter was flushed with appropriate volume heparin  dwells. The catheter exit site was secured with a 0 silk retention suture. A dressing was placed. The patient tolerated the procedure well without immediate post procedural complication. IMPRESSION: Successful placement of a right internal jugular approach 16 cm temporary dialysis catheter with tip terminating with in the superior aspect of the right atrium. The catheter is ready for immediate use. PLAN: This catheter may be converted to a tunneled dialysis catheter at a later date as indicated. Creasie Doctor, MD [facility] Radiology Electronically Signed   By: Creasie Doctor M.D.   On: 01/29/2024 16:41   IR US  Guide Vasc Access Right Result Date: 01/29/2024 INDICATION: 63 year old male with history of acute kidney injury requiring central venous access for hemodialysis. EXAM: NON-TUNNELED CENTRAL VENOUS HEMODIALYSIS CATHETER PLACEMENT WITH ULTRASOUND AND FLUOROSCOPIC GUIDANCE COMPARISON:  None Available. MEDICATIONS: None FLUOROSCOPY TIME:  One mGy COMPLICATIONS: None immediate. PROCEDURE: Informed written consent was obtained from the patient after a discussion of the risks, benefits, and alternatives to treatment. Questions regarding the procedure were encouraged and answered. The right neck and chest were prepped with chlorhexidine  in a sterile fashion, and a sterile drape was applied covering the operative field. Maximum barrier sterile technique with sterile gowns and gloves were used for the procedure. A timeout was performed prior to the initiation of the procedure. After the overlying soft tissues were anesthetized, a small venotomy incision was created and a micropuncture kit was utilized to access the internal jugular vein. Real-time ultrasound guidance was utilized for vascular access including the acquisition of a  permanent ultrasound image documenting patency of the accessed vessel. The microwire was utilized to measure appropriate catheter length. A stiff glidewire was advanced to the level of the IVC. Under fluoroscopic guidance, the venotomy was serially dilated, ultimately allowing placement of a 16 cm temporary Trialysis catheter with tip ultimately terminating within the superior aspect of the right atrium. Final catheter positioning was confirmed and documented with a spot radiographic image. The catheter aspirates and flushes normally. The catheter was flushed with appropriate volume heparin  dwells. The catheter exit site was secured with a 0 silk retention suture. A dressing was placed. The patient tolerated the procedure well without immediate post procedural complication. IMPRESSION: Successful placement of a right internal jugular approach 16 cm temporary dialysis catheter with tip terminating with in the superior aspect of the right atrium. The catheter is ready for immediate use. PLAN: This catheter may be converted to a tunneled dialysis catheter at a later date as indicated. Creasie Doctor, MD [facility] Radiology Electronically Signed   By: Creasie Doctor M.D.   On: 01/29/2024 16:41   CT GUIDED PERITONEAL/RETROPERITONEAL FLUID DRAIN BY PERC CATH Result Date: 01/29/2024 INDICATION: 63 year old male with postoperative intra-abdominal fluid collection presenting for drainage. EXAM: CT PERC DRAIN PERITONEAL ABCESS COMPARISON:  CT abdomen pelvis from 01/28/2024 MEDICATIONS: The patient is currently admitted to the hospital and receiving intravenous antibiotics. The antibiotics were administered within an appropriate time frame prior to the initiation of the procedure. ANESTHESIA/SEDATION: Moderate (conscious) sedation was employed during this procedure. A total of Versed  1 2 mg and  Fentanyl  75 mcg was administered intravenously. Moderate Sedation Time: 18 minutes.  The patient's level of consciousness and vital signs were monitored continuously by radiology nursing throughout the procedure under my direct supervision. CONTRAST:  None COMPLICATIONS: None immediate. PROCEDURE: RADIATION DOSE REDUCTION: This exam was performed according to the departmental dose-optimization program which includes automated exposure control, adjustment of the mA and/or kV according to patient size and/or use of iterative reconstruction technique. Informed written consent was obtained from the patient after a discussion of the risks, benefits and alternatives to treatment. The patient was placed supine on the CT gantry and a pre procedural CT was performed re-demonstrating the known abscess/fluid collection within the midline lower abdomen. The procedure was planned. A timeout was performed prior to the initiation of the procedure. The right midline lower abdomen was prepped and draped in the usual sterile fashion. The overlying soft tissues were anesthetized with 1% lidocaine  with epinephrine . Appropriate trajectory was planned with the use of a 22 gauge spinal needle. An 18 gauge trocar needle was advanced into the abscess/fluid collection and a short Amplatz super stiff wire was coiled within the collection. Appropriate positioning was confirmed with a limited CT scan. The tract was serially dilated allowing placement of a 10 [place] all-purpose drainage catheter. Appropriate positioning was confirmed with a limited postprocedural CT scan. A proximally 100 ml of sanguinous fluid was aspirated. The tube was connected to a bulb suction and sutured in place. A dressing was placed. The patient tolerated the procedure well without immediate post procedural complication. IMPRESSION: Successful CT guided placement of a 10 French all purpose drain catheter into the midline abdominal fluid collection with aspiration of approximately 100 mL of sanguinous fluid. Samples were sent to the laboratory as  requested by the ordering clinical team. Creasie Doctor, MD [facility] Radiology Electronically Signed   By: Creasie Doctor M.D.   On: 01/29/2024 16:39   DG Knee Right Port Result Date: 01/29/2024 CLINICAL DATA:  Right knee pain. EXAM: PORTABLE RIGHT KNEE - 1-2 VIEW COMPARISON:  Right knee radiographs dated 10/24/2012. FINDINGS: No evidence of acute fracture, dislocation, or joint effusion. Mild medial compartment joint space narrowing. Vascular calcifications are noted. IMPRESSION: 1. No acute osseous abnormality. 2. Mild medial compartment joint space narrowing. Electronically Signed   By: Mannie Seek M.D.   On: 01/29/2024 12:09      Medications: Infusions:  sodium chloride  Stopped (01/24/24 2213)   chlorproMAZINE  (THORAZINE ) 12.5 mg in sodium chloride  0.9 % 25 mL IVPB     piperacillin -tazobactam 2.25 g (01/31/24 0057)   TPN ADULT (ION) 70 mL/hr at 01/31/24 0514    Scheduled Medications:  Chlorhexidine  Gluconate Cloth  6 each Topical Daily   diclofenac  Sodium  2 g Topical QID   mouth rinse  15 mL Mouth Rinse 4 times per day   pantoprazole  (PROTONIX ) IV  40 mg Intravenous Q12H   sodium chloride  flush  5 mL Intracatheter Q8H    have reviewed scheduled and prn medications.  Physical Exam: GEN: sitting in bed, nad ENT: no nasal discharge, mmm EYES: no scleral icterus, eomi CV: normal rate, no murmurs PULM: no iwob, bilateral chest rise ABD: NABS, non-distended SKIN: no rashes or jaundice EXT: tr edema, warm and well perfused ACCESS: RIJ temporary dialysis catheter   Amando Chaput W 01/31/2024,7:13 AM  LOS: 20 days

## 2024-02-01 ENCOUNTER — Inpatient Hospital Stay (HOSPITAL_COMMUNITY)

## 2024-02-01 DIAGNOSIS — R609 Edema, unspecified: Secondary | ICD-10-CM

## 2024-02-01 DIAGNOSIS — K631 Perforation of intestine (nontraumatic): Secondary | ICD-10-CM | POA: Diagnosis not present

## 2024-02-01 LAB — RENAL FUNCTION PANEL
Albumin: <1.5 g/dL — ABNORMAL LOW (ref 3.5–5.0)
Anion gap: 13 (ref 5–15)
BUN: 77 mg/dL — ABNORMAL HIGH (ref 8–23)
CO2: 23 mmol/L (ref 22–32)
Calcium: 7.5 mg/dL — ABNORMAL LOW (ref 8.9–10.3)
Chloride: 108 mmol/L (ref 98–111)
Creatinine, Ser: 4.94 mg/dL — ABNORMAL HIGH (ref 0.61–1.24)
GFR, Estimated: 13 mL/min — ABNORMAL LOW (ref >60)
Glucose, Bld: 134 mg/dL — ABNORMAL HIGH (ref 70–99)
Phosphorus: 3.6 mg/dL (ref 2.5–4.6)
Potassium: 3.1 mmol/L — ABNORMAL LOW (ref 3.5–5.1)
Sodium: 144 mmol/L (ref 135–145)

## 2024-02-01 LAB — MAGNESIUM: Magnesium: 1.5 mg/dL — ABNORMAL LOW (ref 1.7–2.4)

## 2024-02-01 LAB — CBC
HCT: 23 % — ABNORMAL LOW (ref 39.0–52.0)
Hemoglobin: 7.3 g/dL — ABNORMAL LOW (ref 13.0–17.0)
MCH: 29.1 pg (ref 26.0–34.0)
MCHC: 31.7 g/dL (ref 30.0–36.0)
MCV: 91.6 fL (ref 80.0–100.0)
Platelets: 226 10*3/uL (ref 150–400)
RBC: 2.51 MIL/uL — ABNORMAL LOW (ref 4.22–5.81)
RDW: 18.1 % — ABNORMAL HIGH (ref 11.5–15.5)
WBC: 10 10*3/uL (ref 4.0–10.5)
nRBC: 0 % (ref 0.0–0.2)

## 2024-02-01 LAB — HEPARIN LEVEL (UNFRACTIONATED)
Heparin Unfractionated: <0.1 [IU]/mL — ABNORMAL LOW (ref 0.30–0.70)
Heparin Unfractionated: 0.32 [IU]/mL (ref 0.30–0.70)
Heparin Unfractionated: 0.27 [IU]/mL — ABNORMAL LOW (ref 0.30–0.70)

## 2024-02-01 MED ORDER — MAGNESIUM SULFATE 2 GM/50ML IV SOLN
2.0000 g | Freq: Once | INTRAVENOUS | Status: AC
  Administered 2024-02-01: 2 g via INTRAVENOUS
  Filled 2024-02-01: qty 50

## 2024-02-01 MED ORDER — LOPERAMIDE HCL 1 MG/7.5ML PO SUSP
2.0000 mg | Freq: Three times a day (TID) | ORAL | Status: DC
  Administered 2024-02-01 (×3): 2 mg via ORAL
  Filled 2024-02-01 (×4): qty 15

## 2024-02-01 MED ORDER — POTASSIUM CHLORIDE 10 MEQ/50ML IV SOLN
10.0000 meq | INTRAVENOUS | Status: AC
  Administered 2024-02-01 (×5): 10 meq via INTRAVENOUS
  Filled 2024-02-01 (×4): qty 50

## 2024-02-01 MED ORDER — TRACE MINERALS CU-MN-SE-ZN 300-55-60-3000 MCG/ML IV SOLN
INTRAVENOUS | Status: AC
  Filled 2024-02-01: qty 728

## 2024-02-01 MED ORDER — ALUM & MAG HYDROXIDE-SIMETH 200-200-20 MG/5ML PO SUSP
15.0000 mL | Freq: Four times a day (QID) | ORAL | Status: DC | PRN
  Administered 2024-02-02 – 2024-02-08 (×5): 15 mL via ORAL
  Filled 2024-02-01 (×5): qty 30

## 2024-02-01 MED ORDER — DAPTOMYCIN-SODIUM CHLORIDE 500-0.9 MG/50ML-% IV SOLN
6.0000 mg/kg | INTRAVENOUS | Status: DC
  Administered 2024-02-01: 500 mg via INTRAVENOUS
  Filled 2024-02-01: qty 50

## 2024-02-01 NOTE — Plan of Care (Signed)
°  Problem: Education: Goal: Knowledge of General Education information will improve Description: Including pain rating scale, medication(s)/side effects and non-pharmacologic comfort measures Outcome: Progressing   Problem: Clinical Measurements: Goal: Ability to maintain clinical measurements within normal limits will improve Outcome: Progressing Goal: Will remain free from infection Outcome: Progressing Goal: Diagnostic test results will improve Outcome: Progressing   Problem: Nutrition: Goal: Adequate nutrition will be maintained Outcome: Progressing
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
## 2024-02-01 NOTE — Progress Notes (Signed)
 PHARMACY - ANTICOAGULATION  Pharmacy Consult for heparin  Indication: History of PE/DVT, warfarin on hold Brief A/P: Heparin  level subtherapeutic Increase Heparin  rate  No Known Allergies  Patient Measurements: Height: 5\' 9"  (175.3 cm) Weight: 80.1 kg (176 lb 9.4 oz) IBW/kg (Calculated) : 70.7 HEPARIN  DW (KG): 80.1kg  Vital Signs: Temp: 98.8 F (37.1 C) (05/03 0300) Temp Source: Oral (05/03 0300) BP: 152/79 (05/03 0300) Pulse Rate: 83 (05/03 0300)  Labs: Recent Labs    01/29/24 1712 01/29/24 2010 01/29/24 2118 01/30/24 0445 01/30/24 1000 01/31/24 0459 01/31/24 0706 01/31/24 2109 02/01/24 0500  HGB  --   --    < > 7.5*   < > PATIENT IDENTIFICATION ERROR. PLEASE DISREGARD RESULTS. ACCOUNT WILL BE CREDITED. 7.3*  --  7.3*  HCT  --   --    < > 22.3*   < > PATIENT IDENTIFICATION ERROR. PLEASE DISREGARD RESULTS. ACCOUNT WILL BE CREDITED. 22.4*  --  23.0*  PLT  --   --    < > 386   < > PATIENT IDENTIFICATION ERROR. PLEASE DISREGARD RESULTS. ACCOUNT WILL BE CREDITED. 294  --  226  HEPARINUNFRC  --   --   --   --   --   --   --  <0.10* <0.10*  CREATININE  --   --   --  5.91*  --   --  5.67*  --  4.94*  TROPONINIHS 10 10  --   --   --   --   --   --   --    < > = values in this interval not displayed.    Estimated Creatinine Clearance: 15.7 mL/min (A) (by C-G formula based on SCr of 4.94 mg/dL (H)).  Assessment: 63 y.o. male with h/o VTE, Coumadin  on hold, for heparin   Goal of Therapy:  Heparin  level 0.3-0.5 units/mL Monitor platelets by anticoagulation protocol: Yes   Plan: Increase Heparin  1800 units/hr Check heparin  level in 8 hours.   Claudine Cullens, PharmD, BCPS  02/01/2024 6:15 AM

## 2024-02-01 NOTE — Progress Notes (Signed)
 PHARMACY - TOTAL PARENTERAL NUTRITION CONSULT NOTE   Indication: colon perforation,  intolerance to enteral nutrition  Patient Measurements: Height: 5\' 9"  (175.3 cm) Weight: 76.5 kg (168 lb 10.4 oz) IBW/kg (Calculated) : 70.7 TPN AdjBW (KG): 78.1 Body mass index is 24.91 kg/m.  Assessment: 62yoM admitted 4/12 for rectal bleeding and weakness s/p multiple GI surgeries with findings of colonic perforation requiring resection of R colon, closure on 4/19. Pt with high OG output post-operatively with no meal intake since admission. Prior to admission, patient reports intake of "normal food". Pharmacy to start TPN for prolonged duration without enteral nutrition and prolonged ileus.   TPN discontinued 4/28 with full liquid diet and displaced central line, however resumed 4/30 with new line due to need for bowel rest and findings on 4/29 CT indicating patient is high risk for return to OR for EC fistula or additional perforations. Patient refused replacement of NGT.  Glucose / Insulin : no hx DM, CBG <160, off SSI Electrolytes: Na 144, K 3.1 (received 30 mEq IV), Mg 1.5, CoCa 9.5, Phos 3.6 down (none in TPN), others wnl Required aggressive Ca replacement earlier in admission Renal: SCr 4.94 down (BL 1) , BUN 77 CRRT 4/19 >> 4/24. Last HD 4/27 PM with 1L removed.  Hepatic: Aslk phos/AST/ALT wnl, Tbili 1.4, albumin  <1.5, TG 63 on 4/28 Intake / Output; MIVF: UOP 1.6 ml/kg/hr, drain 140 mL (bloody), stool 2625 mL   GI Imaging: 4/15 CT A/P: wall thickening of R colon with inflammation and suspected enlarging contained perforation 2/2 diverticulitis, colitis or neoplasm. No organized fluid collection or pneumoperitoneum. Interval increased gastric distension consistent with an ileus. 4/16 KUB: gas and distended loops of colon 4/23 KUB: decreased prominence of SB loops 4/25 CT A/P: 5.9 cm fluid collection inferior to the gallbladder, possibly hepatic abscess 4/29 CT: extravasated oral contrast vs  hematoma, large abdominal/pelvic fluid collection concerning for gas and possible fistula vs SB perforation; colonic diverticulosis; gaseous distension of SB indicate possible ileus, anasarca  GI Surgeries / Procedures:  4/16 Right colectomy, drainage of abscess 4/17 Re-exploration abdomen, small bowel resection 4/19 Re-exploration abdomen, small bowel resection, creation of end ileostomy, abd closure and placement of wound vac 4/28 NGT removed  4/30 Abd drain placed -Dark bloody output.   Central access: CVC 4/29, temp HD cath placed 4/30  TPN start date: 4/21-4/28; 4/30 >>  Nutritional Goals: Goal rate of 65 mL/hr will provide 109 g protein and 2085 kcal  RD Assessment:  Estimated Needs Total Energy Estimated Needs: 2000-2250 kcals Total Protein Estimated Needs: 100-115 grams Total Fluid Estimated Needs: >/= 2L  Current Nutrition:  NPO and TPN 5/2 CLD 5/3 FLD   Plan: Adjust concentrated TPN to reduce fluid with reduced Na: goal 65 mL/hr at 1800, meeting 100% of estimated goal Electrolytes in TPN: increase Na 50 mEq/L, increase K 40 mEq/L, Ca 3 mEq/L, increase Mg 8 mEq/L, increase phos 8 mEq/L. FA:[id number] to 1:2 Give IV Kcl 50 mEq, mag 2g IV  Add standard MVI and trace elements to TPN, no chromium  Monitor TPN labs on Mon/Thurs, PRN  F/u dialysis plan  F/u PO intake, wean TPN as able   Thank you for involving pharmacy in this patient's care.  Dorene Gang, PharmD, BCPS, BCCP Clinical Pharmacist  Please check AMION for all [facility] Pharmacy phone numbers After 10:00 PM, call Main Pharmacy [phone]

## 2024-02-01 NOTE — Progress Notes (Signed)
 [facility] Progress Note Patient: Alexander [name] GNF:[id number] DOB: [birth year] DOA: 01/11/2024  DOS: the patient was seen and examined on 02/01/2024  Brief Hospital Course: Patient is a 63 year old African-American male with past medical history significant for pulmonary embolism on Coumadin  and HFpEF.  Patient presented to the hospital with BRBPR.  Patient was found to have colon perforation.  GI, general surgery were consulted.  Patient underwent ex lap followed by ICU stay.  Developed AKI requiring CRRT.  Intermittent hemodialysis was planned, however, this was not done as patient's renal function seems to be recovering.  Patient continues to make urine.  Serum creatinine has improved from 5.67-4.94 overnight.  Continue to monitor I's and O's.  Wound culture is growing Enterococcus faecium (VRE that is resistant to ampicillin and vancomycin).  Patient is currently on Zyvox .  Infectious disease team has been consulted.  Infectious disease team has suggested using daptomycin.  Significant event. 4/12 admitted for GI bleed and BRBPR.  GI consulted conservative measures.  EGD colonoscopy was recommended. 4/15 repeat CT scan shows evidence of concern for contained perforation in the ascending colon.  General surgery consulted.  Recommending ex lap. 4/16 transferred to ICU. 4/16 cardiorespiratory arrest, achieved ROSC. 4/17 back to the OR, wound VAC placement and small bowel resection. 4/19 Back to the OR s/p exploratory laparotomy, ileostomy creation, small bowel resection, wound VAC in place. nephrology consulted for CRRT 4/23 extubated; off pressors 4/25 Transferred from [facility] ICU to [facility] progressive due to need for hemodialysis. 4/29 triple-lumen CVC placement for TPN. 4/30 CT-guided drain placement.  Dark bloody output.  Anticoagulation on hold.  Also HD catheter placement on right with plan for inpatient HD. 5/2.  Diet advanced to clear liquid diet.  Assessment and Plan: Spontaneous perforation  of colon SP right colectomy. Underwent 3 ex lap as above, SP right hemicolectomy and small bowel resection as well as ileostomy creation. Has a wound VAC. Currently has IV TPN. Management per general surgery. 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 Current plan is conservative management with a drain and a controlled fistula. Bowel rest recommended. NG recommended-patient refused.  Surgery recommended to advance to CLD.  Monitor.   AKI  Initially required CRRT. Suspect combination of shock with ATN. Possibility of IV contrast injury also cannot be ruled out. Continues to third space.  Reports of arthralgia. Urine output is actually improving.  Nephrology recommend to hold off on HD. Currently has a temp HD catheter. 02/01/2024: See above documentation.  Continue to monitor I's and O's.  Monitor renal function and electrolytes.   Acute blood loss anemia as well as anemia of critical illness Iron  deficiency anemia.  B12 deficiency. Presented with BRBPR.  Hemoglobin at baseline around 10.  Hemoglobin on admission was 7.6.  Hemoglobin at its lowest point was 4.0 on 4/17. Currently hemoglobin is stable around 7 Transfuse hemoglobin less than 7. Will resume heparin  without bolus and monitor. Last PRBC transfusion was on 4/30. Folate normal, B12 low.  Iron  repleted.  B12 repleted.  Monitor.  ENTEROCOCCUS FAECIUM infection. Drain culture is growing E faecium.  Sensitivity currently pending.  Given clinical improvement for now I will continue with IV Zosyn  although pending sensitivity or if decompensates again may require vancomycin. 02/01/2024: Infectious disease input is highly appreciated.  ID has recommended stopping Zyvox  and putting patient on daptomycin.  Abdominal wound cultures growing VRE (resistant to ampicillin and  vancomycin).  Multifactorial shock. Patient has  needed vasopressors throughout the hospital stay multiple times. After aggressive IV hydration as well as IV albumin  blood pressure has improved. Monitor.  Septic shock.  Present on admission. Sepsis physiology appears to have resolved for now.  Hypotension. Developed another episode of hypotension on 4/29. Most likely in the setting of pain medication. Zosyn  have been resumed. Improving leukocytosis. Will monitor for improvement. IV albumin  given.  History of PE. 07/2023. Presented with BRBPR.  Anticoagulation has been on hold.  Was resumed with heparin . Given anemia seen again heparin  is on hold. Especially in the setting of drain output being bloody. Remains at risk for recurrent PE.   Acute hypoxic respiratory failure Requiring intubation. Was extubated on 4/23.  Intermittently on oxygen.  Continue incentive spirometry.   Diverticulitis Treated before.  Agitation. Likely hospital induced delirium. Monitor.  As needed Ativan .  Hypoglycemia. As the patient was not able to get his TPN his blood sugars were low. Treated with D10.  Now better.  Acute right knee pain. Concern for gout. Reports pins-and-needles like pain in his right knee. Suspect arthralgia in the setting of need for HD. X-ray unremarkable. Lower extremity venous Doppler ordered. Now appears with swelling and warmth.  No significant redness so far.  Uric acid is normal.  CRP ESR elevated although it could be multifactorial.  Not a candidate for IV steroids, IV Toradol, other oral NSAIDs or colchicine .  Continue pain control for now. Reports no pain as of 5/2.  Hiccups. As needed Thorazine .   Subjective:  -No new complaints.    Physical Exam: General condition: Not in any distress.  Depressed affect.   CVS: S1-S2. Lungs: Clear to auscultation. Abdomen: Abdominal drain. Neuro: Awake and alert.  Data Reviewed:  Disposition: Status is: Inpatient Remains inpatient appropriate because: Monitor for  renal function and stability of H&H.  SCD's Start: 01/15/24 1400   Family Communication: No one at bedside.  Discussed with son on the phone on 5/1 Level of care: Progressive   Vitals:   02/01/24 0600 02/01/24 0828 02/01/24 1211 02/01/24 1717  BP:  (!) 144/78 (!) 146/83 (!) 150/78  Pulse:  83 98 97  Resp:  16 18 18   Temp:  98.4 F (36.9 C) 98.7 F (37.1 C) 99.5 F (37.5 C)  TempSrc:  Oral Oral Oral  SpO2:   98% 98%  Weight: 76.5 kg     Height:         Author: Doroteo Gasmen, MD 02/01/2024 5:43 PM  Please look on [email or web address] to find out who is on call.

## 2024-02-01 NOTE — Progress Notes (Addendum)
 14 Days Post-Op  Subjective: Seems a bit confused this morning. Tells me that I told him I would come back yesterday and it's not right that I didn't. Upset during encounter.  Objective: Vital signs in last 24 hours: Temp:  [98.6 F (37 C)-100.3 F (37.9 C)] 98.8 F (37.1 C) (05/03 0300) Pulse Rate:  [81-97] 83 (05/03 0300) Resp:  [10-20] 17 (05/03 0300) BP: (133-152)/(67-79) 152/79 (05/03 0300) SpO2:  [92 %-96 %] 96 % (05/03 0300) Last BM Date : 01/31/24  Intake/Output from previous day: 05/02 0701 - 05/03 0700 In: 3535.5 [P.O.:1320; I.V.:1512.1; IV Piggyback:698.4] Out: 5435 [Urine:2700; Drains:110; Stool:2625] Intake/Output this shift: Total I/O In: 1491.5 [P.O.:360; I.V.:726.5; Other:5; IV Piggyback:400] Out: 1765 [Urine:1000; Drains:40; Stool:725]  PE: Gen: awake, alert Abd: Soft, appropriately tender. IR drain still bloody appearance; no increase in output or change in character with initiation of clears. Wound not examined this morning due to patient agitation.   Lab Results:  Recent Labs    01/31/24 0706 02/01/24 0500  WBC 10.2 10.0  HGB 7.3* 7.3*  HCT 22.4* 23.0*  PLT 294 226   BMET Recent Labs    01/30/24 0445 01/31/24 0706  NA 143 148*  K 4.1 3.2*  CL 107 111  CO2 19* 25  GLUCOSE 153* 157*  BUN 89* 86*  CREATININE 5.91* 5.67*  CALCIUM  7.6* 7.5*   PT/INR No results for input(s): "LABPROT", "INR" in the last 72 hours.  CMP     Component Value Date/Time   NA 148 (H) 01/31/2024 0706   NA 141 01/22/2020 1137   K 3.2 (L) 01/31/2024 0706   CL 111 01/31/2024 0706   CO2 25 01/31/2024 0706   GLUCOSE 157 (H) 01/31/2024 0706   BUN 86 (H) 01/31/2024 0706   BUN 13 01/22/2020 1137   CREATININE 5.67 (H) 01/31/2024 0706   CREATININE 0.98 06/14/2017 0935   CALCIUM  7.5 (L) 01/31/2024 0706   PROT 5.9 (L) 01/30/2024 0445   PROT 7.3 01/22/2020 1137   ALBUMIN  <1.5 (L) 01/31/2024 0706   ALBUMIN  4.2 01/22/2020 1137   AST 39 01/30/2024 0445   ALT 44  01/30/2024 0445   ALKPHOS 56 01/30/2024 0445   BILITOT 1.4 (H) 01/30/2024 0445   BILITOT 0.4 01/22/2020 1137   GFRNONAA 11 (L) 01/31/2024 0706   GFRNONAA >89 09/17/2014 1253   GFRAA 111 01/22/2020 1137   GFRAA >89 09/17/2014 1253   Lipase     Component Value Date/Time   LIPASE 22 09/28/2019 1109       Studies/Results: No results found.    Anti-infectives: Anti-infectives (From admission, onward)    Start     Dose/Rate Route Frequency Ordered Stop   01/31/24 2200  linezolid  (ZYVOX ) IVPB 600 mg        600 mg 300 mL/hr over 60 Minutes Intravenous Every 12 hours 01/31/24 2036     01/28/24 1000  piperacillin -tazobactam (ZOSYN ) IVPB 2.25 g        2.25 g 100 mL/hr over 30 Minutes Intravenous Every 8 hours 01/28/24 0909     01/18/24 1400  piperacillin -tazobactam (ZOSYN ) IVPB 3.375 g  Status:  Discontinued        3.375 g 12.5 mL/hr over 240 Minutes Intravenous Every 6 hours 01/18/24 1241 01/18/24 1242   01/18/24 1400  piperacillin -tazobactam (ZOSYN ) IVPB 3.375 g        3.375 g 100 mL/hr over 30 Minutes Intravenous Every 6 hours 01/18/24 1242 01/23/24 2046   01/18/24 0000  piperacillin -tazobactam (ZOSYN )  IVPB 2.25 g  Status:  Discontinued        2.25 g 100 mL/hr over 30 Minutes Intravenous Every 6 hours 01/17/24 2120 01/18/24 1241   01/12/24 0200  piperacillin -tazobactam (ZOSYN ) IVPB 3.375 g  Status:  Discontinued        3.375 g 12.5 mL/hr over 240 Minutes Intravenous Every 8 hours 01/11/24 2120 01/17/24 2120   01/11/24 1815  piperacillin -tazobactam (ZOSYN ) IVPB 3.375 g        3.375 g 100 mL/hr over 30 Minutes Intravenous  Once 01/11/24 1806 01/11/24 1921        Assessment/Plan LAPAROTOMY, EXPLORATORY RIGHT COLECTOMY (without anastomosis) PLACEMENT OF ABTHERA WOUND VAC DRAINAGE OF INTRA-ABDOMINAL ABSCESS  4/16 EW   RE-EXPLORATION ABDOMEN, PLACEMENT OF ABTHERA WOUND VAC, SMALL BOWEL RESECTION 01/16/2024 EW   Re-exploration abdomen, small bowel resection, creation of  end ileostomy, abd closure, placement wound vac 4/19 EW  - TNA for nutritional support.  CT scan was certainly concerning for leak given presumed extravasation of contrast noted in this fluid collection however, drain is bloody right now with no succus noted despite resuming clear liquid diet.  Will try full liquids today. -IR drain placed in suspect abscess/leak 4/30-output has been bloody but not purulent or enteric per se - Cont to monitor ostomy output - Completed post op abx, but WBC increasing, tachy, lethargic 4/29, with increasing temp-Zosyn  restarted  WBC normalized   FEN -trial full liquids today, fibercon added 5/2 for thin high ostomy output VTE - SCDs, heparin  gtt held due to anemia ID - Completed post op abx. reinititiation of Zosyn  on 4/29, changed to zyvox  5/2  - Per TRH -  Pleural effusions Septic shock - resolved ABL anemia - monitor and transfuse as needed  Coagulopathy - resolved AKI - started CRRT 4/19,  defer to renal.  Holding HD as UOP is good and Cr improving Acute hypoxemic resp failure - extubated H/o VTE/PE/DVT - scds for now; was on coumadin      LOS: 21 days    Adalberto Acton ,MD [facility] 02/01/2024, 5:50 AM Please see Amion for pager number during day hours 7:00am-4:30pm or 7:00am -11:30am on weekends

## 2024-02-01 NOTE — Progress Notes (Signed)
 Pharmacy Antibiotic Note  Jeremee Domen is a 63 y.o. male admitted on 01/11/2024 with Abdominal infection growing VRE .  Pharmacy has been consulted to stop Zyvox  and initiate daptomycin dosing.The patient has needed intermittent hemodialysis.  Plan: Daptomycin 6mg /kg (rounded to 500mg  premade bag) q48 hours Monitor clinical progression and cultures and weekly CK levels   Height: 5\' 9"  (175.3 cm) Weight: 76.5 kg (168 lb 10.4 oz) IBW/kg (Calculated) : 70.7  Temp (24hrs), Avg:99.2 F (37.3 C), Min:98.4 F (36.9 C), Max:100.3 F (37.9 C)  Recent Labs  Lab 01/28/24 1653 01/28/24 1745 01/29/24 0449 01/29/24 1623 01/30/24 0445 01/30/24 1000 01/30/24 1935 01/31/24 0103 01/31/24 0459 01/31/24 0706 02/01/24 0500  WBC  --    < > 24.3*   < > 12.6*   < > 11.5* 11.2* PATIENT IDENTIFICATION ERROR. PLEASE DISREGARD RESULTS. ACCOUNT WILL BE CREDITED. 10.2 10.0  CREATININE 5.79*  --  6.01*  --  5.91*  --   --   --   --  5.67* 4.94*  LATICACIDVEN 1.6  --   --   --   --   --   --   --   --   --   --    < > = values in this interval not displayed.    Estimated Creatinine Clearance: 15.7 mL/min (A) (by C-G formula based on SCr of 4.94 mg/dL (H)).    No Known Allergies   Thank you for allowing pharmacy to be a part of this patient's care.  Angelo Kennedy Freddi Schrager 02/01/2024 6:55 PM

## 2024-02-01 NOTE — Progress Notes (Signed)
 [facility] NEPHROLOGY PROGRESS NOTE  Assessment/ Plan: Pt is a 63 y.o. yo male with past medical history of DVT, PE on Coumadin  admitted on 4/12 with rectal bleed and hypotension seen for AKI.  # AKI - mostly anuric, creat 1.0 early in this hospital stay. After onset of severe shock 4/16 pt had progressive AKI. UA negative. Renal US  shows no obstruction. AKI due to ATN primarily due to shock +/- IV contrast. Appreciate RIJ temp cath by VIR for temp cath on 4/30. CRRT from 4/19-4/24.  Last HD on 4/27.   Urine output has been very good and finally renal function is improving; hopefully this will represent a trend.   #S/P cardiac arrest: 4/17  #Shock/ hypotension: Off of pressors now.  # Acute diverticulitis: w/ perforated ascending colon, s/p ileostomy, R hemicolectomy and some small bowel resection. Per gen surg/ pmd. New fluid collection in abd but pt refused abd drain.   On TPN  #VDRF: Improved now extubated   Subjective: Wants to advance diet.   Denies nausea, shortness of breath  Last  dialysis on 4/27.  Objective Vital signs in last 24 hours: Vitals:   01/31/24 1928 01/31/24 2300 02/01/24 0300 02/01/24 0600  BP: (!) 143/75 133/70 (!) 152/79   Pulse:   83   Resp: 14 20 17    Temp: 100.3 F (37.9 C) 99.3 F (37.4 C) 98.8 F (37.1 C)   TempSrc: Oral Oral Oral   SpO2: 96% 92% 96%   Weight:    76.5 kg  Height:       Weight change: -3.6 kg  Intake/Output Summary (Last 24 hours) at 02/01/2024 0752 Last data filed at 02/01/2024 0600 Gross per 24 hour  Intake 3415.5 ml  Output 5265 ml  Net -1849.5 ml       Labs: RENAL PANEL Recent Labs  Lab 01/27/24 0045 01/28/24 1653 01/29/24 0449 01/30/24 0445 01/31/24 0459 01/31/24 0706 02/01/24 0500  NA 139 140 140 143  --  148* 144  K 3.3* 5.3* 5.3* 4.1  --  3.2* 3.1*  CL 103 104 105 107  --  111 108  CO2 25 21* 20* 19*  --  25 23  GLUCOSE 108* 81 90 153*  --  157* 134*  BUN 55* 81* 87* 89*  --  86* 77*   CREATININE 3.91* 5.79* 6.01* 5.91*  --  5.67* 4.94*  CALCIUM  7.8* 7.9* 7.8* 7.6*  --  7.5* 7.5*  MG 1.7 2.0  --  1.9 PATIENT IDENTIFICATION ERROR. PLEASE DISREGARD RESULTS. ACCOUNT WILL BE CREDITED. 1.9 1.5*  PHOS 2.3*  --  8.5* 6.2*  --  4.8* 3.6  ALBUMIN  <1.5* <1.5*  --  <1.5*  --  <1.5* <1.5*    Liver Function Tests: Recent Labs  Lab 01/28/24 1653 01/30/24 0445 01/31/24 0706 02/01/24 0500  AST 33 39  --   --   ALT 49* 44  --   --   ALKPHOS 65 56  --   --   BILITOT 1.5* 1.4*  --   --   PROT 5.8* 5.9*  --   --   ALBUMIN  <1.5* <1.5* <1.5* <1.5*   No results for input(s): "LIPASE", "AMYLASE" in the last 168 hours. No results for input(s): "AMMONIA" in the last 168 hours. CBC: Recent Labs    07/19/23 0448 07/22/23 0441 12/03/23 1437 12/04/23 1531 12/05/23 0036 01/11/24 1458 01/11/24 1957 01/12/24 0056 01/12/24 1044 01/12/24 1552 01/30/24 1935 01/31/24 0103 01/31/24 0459 01/31/24 0706 02/01/24 0500  HGB 10.1*   < > 7.6 Repeated and verified X2.*   < >  --    < > 6.7*   < >  --    < > 7.4* 7.4* PATIENT IDENTIFICATION ERROR. PLEASE DISREGARD RESULTS. ACCOUNT WILL BE CREDITED. 7.3* 7.3*  MCV 92.7   < > 75.5*   < >  --    < > 76.6*   < >  --    < > 88.4 90.7 PATIENT IDENTIFICATION ERROR. PLEASE DISREGARD RESULTS. ACCOUNT WILL BE CREDITED. 91.1 91.6  VITAMINB12 222  --   --   --  100*  --   --   --  93*  --   --   --   --   --   --   FOLATE 7.5  --   --   --  8.0  --   --   --  9.3  --   --   --   --   --   --   FERRITIN 25  --  4.5*  --  3*  --  19*  --   --   --   --   --   --   --   --   TIBC 328  --  382.2  --  364  --  293  --   --   --   --   --   --   --   --   IRON  25*  --  15*  --  11*  --  11*  --   --   --   --   --   --   --   --   RETICCTPCT 1.4  --   --   --  0.7  --  0.8  --   --   --   --   --   --   --   --    < > = values in this interval not displayed.    Cardiac Enzymes: No results for input(s): "CKTOTAL", "CKMB", "CKMBINDEX", "TROPONINI" in the last  168 hours. CBG: Recent Labs  Lab 01/30/24 0747 01/30/24 1139 01/30/24 1526 01/30/24 2007 01/30/24 2342  GLUCAP 144* 155* 142* 130* 133*    Iron  Studies: No results for input(s): "IRON ", "TIBC", "TRANSFERRIN", "FERRITIN" in the last 72 hours. Studies/Results: No results found.     Medications: Infusions:  sodium chloride  Stopped (01/24/24 2213)   chlorproMAZINE  (THORAZINE ) 12.5 mg in sodium chloride  0.9 % 25 mL IVPB     heparin  1,800 Units/hr (02/01/24 4098)   linezolid  (ZYVOX ) IV Stopped (01/31/24 2235)   magnesium  sulfate bolus IVPB     piperacillin -tazobactam Stopped (02/01/24 0253)   potassium chloride      TPN ADULT (ION) 65 mL/hr at 02/01/24 0412   TPN ADULT (ION)      Scheduled Medications:  Chlorhexidine  Gluconate Cloth  6 each Topical Daily   diclofenac  Sodium  2 g Topical QID   mouth rinse  15 mL Mouth Rinse 4 times per day   pantoprazole  (PROTONIX ) IV  40 mg Intravenous Q12H   polycarbophil  625 mg Oral Daily   sodium chloride  flush  5 mL Intracatheter Q8H   sucralfate   1 g Oral BID    have reviewed scheduled and prn medications.  Physical Exam: GEN: sitting in bed, nad ENT: no nasal discharge, mmm EYES: no scleral icterus, eomi CV: normal rate, no murmurs PULM: no iwob, bilateral chest rise ABD: NABS, non-distended  SKIN: no rashes or jaundice EXT: tr edema, warm and well perfused ACCESS: RIJ temporary dialysis catheter   Chananya Canizalez W 02/01/2024,7:52 AM  LOS: 21 days

## 2024-02-01 NOTE — Progress Notes (Signed)
 PHARMACY - ANTICOAGULATION CONSULT NOTE  Pharmacy Consult for heparin  Indication:  h/o VTE  Labs: Recent Labs    01/30/24 0445 01/30/24 1000 01/31/24 0459 01/31/24 0706 01/31/24 2109 02/01/24 0500 02/01/24 1447 02/01/24 2220  HGB 7.5*   < > PATIENT IDENTIFICATION ERROR. PLEASE DISREGARD RESULTS. ACCOUNT WILL BE CREDITED. 7.3*  --  7.3*  --   --   HCT 22.3*   < > PATIENT IDENTIFICATION ERROR. PLEASE DISREGARD RESULTS. ACCOUNT WILL BE CREDITED. 22.4*  --  23.0*  --   --   PLT 386   < > PATIENT IDENTIFICATION ERROR. PLEASE DISREGARD RESULTS. ACCOUNT WILL BE CREDITED. 294  --  226  --   --   HEPARINUNFRC  --   --   --   --    < > <0.10* 0.32 0.27*  CREATININE 5.91*  --   --  5.67*  --  4.94*  --   --    < > = values in this interval not displayed.   Assessment: 63yo male now subtherapeutic on heparin  after one level at low end of goal; no infusion issues or signs of bleeding per RN.  Goal of Therapy:  Heparin  level 0.3-0.7 units/ml   Plan:  Increase heparin  infusion by 10% to 2000 units/hr. Check level in 8 hours.   Lonnie Roberts, PharmD, BCPS 02/01/2024 11:15 PM

## 2024-02-01 NOTE — Plan of Care (Signed)
  Problem: Education: Goal: Knowledge of General Education information will improve Description: Including pain rating scale, medication(s)/side effects and non-pharmacologic comfort measures Outcome: Progressing   Problem: Health Behavior/Discharge Planning: Goal: Ability to manage health-related needs will improve Outcome: Progressing   Problem: Clinical Measurements: Goal: Ability to maintain clinical measurements within normal limits will improve Outcome: Progressing Goal: Will remain free from infection Outcome: Progressing Goal: Diagnostic test results will improve Outcome: Progressing Goal: Respiratory complications will improve Outcome: Progressing Goal: Cardiovascular complication will be avoided Outcome: Progressing   Problem: Nutrition: Goal: Adequate nutrition will be maintained Outcome: Progressing   Problem: Coping: Goal: Level of anxiety will decrease Outcome: Progressing   Problem: Elimination: Goal: Will not experience complications related to bowel motility Outcome: Progressing Goal: Will not experience complications related to urinary retention Outcome: Progressing   Problem: Pain Managment: Goal: General experience of comfort will improve and/or be controlled Outcome: Progressing   Problem: Safety: Goal: Ability to remain free from injury will improve Outcome: Progressing   Problem: Skin Integrity: Goal: Risk for impaired skin integrity will decrease Outcome: Progressing   Problem: Activity: Goal: Ability to tolerate increased activity will improve Outcome: Progressing   Problem: Respiratory: Goal: Ability to maintain a clear airway and adequate ventilation will improve Outcome: Progressing   Problem: Role Relationship: Goal: Method of communication will improve Outcome: Progressing   Problem: Education: Goal: Ability to describe self-care measures that may prevent or decrease complications (Diabetes Survival Skills Education) will  improve Outcome: Progressing Goal: Individualized Educational Video(s) Outcome: Progressing   Problem: Coping: Goal: Ability to adjust to condition or change in health will improve Outcome: Progressing   Problem: Fluid Volume: Goal: Ability to maintain a balanced intake and output will improve Outcome: Progressing   Problem: Health Behavior/Discharge Planning: Goal: Ability to identify and utilize available resources and services will improve Outcome: Progressing Goal: Ability to manage health-related needs will improve Outcome: Progressing   Problem: Metabolic: Goal: Ability to maintain appropriate glucose levels will improve Outcome: Progressing   Problem: Nutritional: Goal: Maintenance of adequate nutrition will improve Outcome: Progressing Goal: Progress toward achieving an optimal weight will improve Outcome: Progressing   Problem: Skin Integrity: Goal: Risk for impaired skin integrity will decrease Outcome: Progressing   Problem: Tissue Perfusion: Goal: Adequacy of tissue perfusion will improve Outcome: Progressing   Problem: Safety: Goal: Non-violent Restraint(s) Outcome: Progressing

## 2024-02-02 DIAGNOSIS — D509 Iron deficiency anemia, unspecified: Secondary | ICD-10-CM | POA: Diagnosis not present

## 2024-02-02 DIAGNOSIS — K631 Perforation of intestine (nontraumatic): Secondary | ICD-10-CM | POA: Diagnosis not present

## 2024-02-02 DIAGNOSIS — J9601 Acute respiratory failure with hypoxia: Secondary | ICD-10-CM | POA: Diagnosis not present

## 2024-02-02 DIAGNOSIS — N179 Acute kidney failure, unspecified: Secondary | ICD-10-CM | POA: Diagnosis not present

## 2024-02-02 LAB — CBC
HCT: 24.2 % — ABNORMAL LOW (ref 39.0–52.0)
Hemoglobin: 7.7 g/dL — ABNORMAL LOW (ref 13.0–17.0)
MCH: 29.3 pg (ref 26.0–34.0)
MCHC: 31.8 g/dL (ref 30.0–36.0)
MCV: 92 fL (ref 80.0–100.0)
Platelets: 193 10*3/uL (ref 150–400)
RBC: 2.63 MIL/uL — ABNORMAL LOW (ref 4.22–5.81)
RDW: 17.7 % — ABNORMAL HIGH (ref 11.5–15.5)
WBC: 8.4 10*3/uL (ref 4.0–10.5)
nRBC: 0 % (ref 0.0–0.2)

## 2024-02-02 LAB — RENAL FUNCTION PANEL
Albumin: <1.5 g/dL — ABNORMAL LOW (ref 3.5–5.0)
Anion gap: 11 (ref 5–15)
BUN: 64 mg/dL — ABNORMAL HIGH (ref 8–23)
CO2: 21 mmol/L — ABNORMAL LOW (ref 22–32)
Calcium: 8 mg/dL — ABNORMAL LOW (ref 8.9–10.3)
Chloride: 109 mmol/L (ref 98–111)
Creatinine, Ser: 3.96 mg/dL — ABNORMAL HIGH (ref 0.61–1.24)
GFR, Estimated: 16 mL/min — ABNORMAL LOW (ref >60)
Glucose, Bld: 108 mg/dL — ABNORMAL HIGH (ref 70–99)
Phosphorus: 2.9 mg/dL (ref 2.5–4.6)
Potassium: 3.4 mmol/L — ABNORMAL LOW (ref 3.5–5.1)
Sodium: 141 mmol/L (ref 135–145)

## 2024-02-02 LAB — HEPARIN LEVEL (UNFRACTIONATED)
Heparin Unfractionated: 0.22 [IU]/mL — ABNORMAL LOW (ref 0.30–0.70)
Heparin Unfractionated: 0.42 [IU]/mL (ref 0.30–0.70)

## 2024-02-02 LAB — MAGNESIUM: Magnesium: 2.2 mg/dL (ref 1.7–2.4)

## 2024-02-02 MED ORDER — POTASSIUM CHLORIDE CRYS ER 20 MEQ PO TBCR
20.0000 meq | EXTENDED_RELEASE_TABLET | Freq: Once | ORAL | Status: AC
  Administered 2024-02-02: 20 meq via ORAL
  Filled 2024-02-02: qty 1

## 2024-02-02 MED ORDER — FERROUS SULFATE 75 (15 FE) MG/ML PO SOLN
15.0000 mg | Freq: Two times a day (BID) | ORAL | Status: DC
  Filled 2024-02-02: qty 1

## 2024-02-02 MED ORDER — CALCIUM POLYCARBOPHIL 625 MG PO TABS
1250.0000 mg | ORAL_TABLET | Freq: Two times a day (BID) | ORAL | Status: DC
  Administered 2024-02-02 – 2024-02-10 (×16): 1250 mg via ORAL
  Filled 2024-02-02 (×18): qty 2

## 2024-02-02 MED ORDER — TRAVASOL 10 % IV SOLN
INTRAVENOUS | Status: AC
Start: 2024-02-02 — End: 2024-02-03
  Filled 2024-02-02: qty 1060.8

## 2024-02-02 MED ORDER — SODIUM CHLORIDE (PF) 0.9 % IJ SOLN
INTRAMUSCULAR | Status: AC
Start: 2024-02-02 — End: 2024-02-03
  Filled 2024-02-02: qty 10

## 2024-02-02 MED ORDER — ENSURE ENLIVE PO LIQD
237.0000 mL | Freq: Two times a day (BID) | ORAL | Status: DC
Start: 2024-02-02 — End: 2024-02-11
  Administered 2024-02-02 – 2024-02-07 (×2): 237 mL via ORAL

## 2024-02-02 MED ORDER — LOPERAMIDE HCL 2 MG PO CAPS
4.0000 mg | ORAL_CAPSULE | Freq: Three times a day (TID) | ORAL | Status: DC
  Administered 2024-02-02 – 2024-02-04 (×9): 4 mg via ORAL
  Filled 2024-02-02 (×9): qty 2

## 2024-02-02 MED ORDER — SODIUM CHLORIDE 0.9 % IV SOLN
3.0000 g | Freq: Two times a day (BID) | INTRAVENOUS | Status: DC
  Administered 2024-02-02 – 2024-02-04 (×6): 3 g via INTRAVENOUS
  Filled 2024-02-02 (×6): qty 8

## 2024-02-02 MED ORDER — OXYCODONE HCL 5 MG/5ML PO SOLN
5.0000 mg | ORAL | Status: DC | PRN
  Administered 2024-02-02 – 2024-02-06 (×9): 5 mg via ORAL
  Filled 2024-02-02 (×9): qty 5

## 2024-02-02 MED ORDER — FERROUS SULFATE 325 (65 FE) MG PO TABS: 325.0000 mg | ORAL_TABLET | Freq: Two times a day (BID) | ORAL | Status: DC

## 2024-02-02 MED ORDER — POTASSIUM CHLORIDE 10 MEQ/100ML IV SOLN
10.0000 meq | INTRAVENOUS | Status: AC
Start: 2024-02-02 — End: 2024-02-02
  Administered 2024-02-02 (×3): 10 meq via INTRAVENOUS
  Filled 2024-02-02 (×3): qty 100

## 2024-02-02 MED ORDER — POTASSIUM CHLORIDE 10 MEQ/50ML IV SOLN
10.0000 meq | INTRAVENOUS | Status: DC
  Administered 2024-02-02: 10 meq via INTRAVENOUS
  Filled 2024-02-02: qty 50

## 2024-02-02 MED ORDER — FERROUS SULFATE 325 (65 FE) MG PO TABS
325.0000 mg | ORAL_TABLET | Freq: Every day | ORAL | Status: DC
  Administered 2024-02-04 – 2024-02-10 (×7): 325 mg via ORAL
  Filled 2024-02-02 (×8): qty 1

## 2024-02-02 NOTE — Progress Notes (Signed)
 PHARMACY - TOTAL PARENTERAL NUTRITION CONSULT NOTE   Indication: colon perforation,  intolerance to enteral nutrition  Patient Measurements: Height: 5\' 9"  (175.3 cm) Weight: 75.5 kg (166 lb 7.2 oz) IBW/kg (Calculated) : 70.7 TPN AdjBW (KG): 78.1 Body mass index is 24.58 kg/m.  Assessment: 62yoM admitted 4/12 for rectal bleeding and weakness s/p multiple GI surgeries with findings of colonic perforation requiring resection of R colon, closure on 4/19. Pt with high OG output post-operatively with no meal intake since admission. Prior to admission, patient reports intake of "normal food". Pharmacy to start TPN for prolonged duration without enteral nutrition and prolonged ileus.   TPN discontinued 4/28 with full liquid diet and displaced central line, however resumed 4/30 with new line due to need for bowel rest and findings on 4/29 CT indicating patient is high risk for return to OR for EC fistula or additional perforations. Patient refused replacement of NGT.  5/4 patient kidney function is recovering with good urine and high ostomy output. He states that he is drinking a good amount of fluid daily. No LE edema. Will unconcentrate TPN.   Glucose / Insulin : no hx DM, CBG < 140, off SSI Electrolytes: K 3.4 (received 50 mEq IV), CO2 21, CoCa 10, Phos 2.9 down, others wnl Required aggressive Ca replacement earlier in admission Renal: SCr 3.96 down (BL 1) , BUN 64 CRRT 4/19 >> 4/24. Last HD 4/27 PM with 1L removed.  Hepatic: Aslk phos/AST/ALT wnl, Tbili 1.4, albumin  <1.5, TG 63 on 4/28 Intake / Output; MIVF: UOP 1.2 ml/kg/hr, drain 45 mL, stool on loperamide TID + fibercon  GI Imaging: 4/15 CT A/P: wall thickening of R colon with inflammation and suspected enlarging contained perforation 2/2 diverticulitis, colitis or neoplasm. No organized fluid collection or pneumoperitoneum. Interval increased gastric distension consistent with an ileus. 4/16 KUB: gas and distended loops of  colon 4/23 KUB: decreased prominence of SB loops 4/25 CT A/P: 5.9 cm fluid collection inferior to the gallbladder, possibly hepatic abscess 4/29 CT: extravasated oral contrast vs hematoma, large abdominal/pelvic fluid collection concerning for gas and possible fistula vs SB perforation; colonic diverticulosis; gaseous distension of SB indicate possible ileus, anasarca  GI Surgeries / Procedures:  4/16 Right colectomy, drainage of abscess 4/17 Re-exploration abdomen, small bowel resection 4/19 Re-exploration abdomen, small bowel resection, creation of end ileostomy, abd closure and placement of wound vac 4/28 NGT removed  4/30 Abd drain placed -Dark bloody output.   Central access: CVC 4/29, temp HD cath placed 4/30  TPN start date: 4/21-4/28; 4/30 >>  Nutritional Goals: Concentrated: Goal rate of 65 mL/hr will provide 106 g protein and 2040 kcal Not concentrated: Goal rate of 85 ml/hr will provide 106 g protein and 2097 kcal  RD Assessment:  Estimated Needs Total Energy Estimated Needs: 2000-2250 kcals Total Protein Estimated Needs: 100-115 grams Total Fluid Estimated Needs: >/= 2L  Current Nutrition:  NPO and TPN 5/2 CLD 5/3 FLD - ate 3-4 spoonfuls of grits, jello, pudding 5/4 FLD - only drank juice for breakfast, open to trying vanilla Ensure/ Boost  Plan: Adjust to not concentrated TPN goal 85 mL/hr at 1800, meeting 100% of estimated needs Electrolytes in TPN: increase Na 100 mEq/L, increase K 45 mEq/L, Ca 2 mEq/L, Mg 6 mEq/L, increase phos 12 mEq/L. Increase EX:BMWUXLK to max acet Give IV Kcl 40 mEq  Add standard MVI and trace elements to TPN, no chromium  Monitor TPN labs on Mon/Thurs, PRN   Add Ensure, f/u PO intake, wean TPN  as able   Thank you for involving pharmacy in this patient's care.  Dorene Gang, PharmD, BCPS, BCCP Clinical Pharmacist  Please check AMION for all [facility] phone numbers After 10:00 PM, call Main Pharmacy [phone]

## 2024-02-02 NOTE — Plan of Care (Signed)
  Problem: Education: Goal: Knowledge of General Education information will improve Description: Including pain rating scale, medication(s)/side effects and non-pharmacologic comfort measures Outcome: Progressing   Problem: Clinical Measurements: Goal: Ability to maintain clinical measurements within normal limits will improve Outcome: Progressing Goal: Diagnostic test results will improve Outcome: Progressing Goal: Respiratory complications will improve Outcome: Progressing Goal: Cardiovascular complication will be avoided Outcome: Progressing   Problem: Elimination: Goal: Will not experience complications related to urinary retention Outcome: Progressing   Problem: Pain Managment: Goal: General experience of comfort will improve and/or be controlled Outcome: Progressing   Problem: Safety: Goal: Ability to remain free from injury will improve Outcome: Progressing   Problem: Activity: Goal: Ability to tolerate increased activity will improve Outcome: Progressing   Problem: Respiratory: Goal: Ability to maintain a clear airway and adequate ventilation will improve Outcome: Progressing   Problem: Coping: Goal: Ability to adjust to condition or change in health will improve Outcome: Progressing   Problem: Fluid Volume: Goal: Ability to maintain a balanced intake and output will improve Outcome: Progressing   Problem: Metabolic: Goal: Ability to maintain appropriate glucose levels will improve Outcome: Progressing

## 2024-02-02 NOTE — Plan of Care (Signed)
  Problem: Clinical Measurements: Goal: Ability to maintain clinical measurements within normal limits will improve Outcome: Progressing Goal: Will remain free from infection Outcome: Progressing Goal: Cardiovascular complication will be avoided Outcome: Progressing   Problem: Nutrition: Goal: Adequate nutrition will be maintained Outcome: Progressing   

## 2024-02-02 NOTE — Progress Notes (Signed)
 Progress Note  15 Days Post-Op  Subjective: Pt complaining that he has not been out of bed and feels like no one is doing anything for him.   Objective: Vital signs in last 24 hours: Temp:  [98.7 F (37.1 C)-99.5 F (37.5 C)] 99.1 F (37.3 C) (05/04 0746) Pulse Rate:  [83-98] 83 (05/04 0257) Resp:  [13-18] 18 (05/04 0746) BP: (143-150)/(67-83) 147/79 (05/04 0746) SpO2:  [97 %-99 %] 97 % (05/04 0257) Weight:  [75.5 kg] 75.5 kg (05/04 0513) Last BM Date : 02/01/24  Intake/Output from previous day: 05/03 0701 - 05/04 0700 In: 3283.2 [P.O.:500; I.V.:1992.6; IV Piggyback:770.6] Out: 4395 [Urine:2100; Drains:45; Stool:2250] Intake/Output this shift: Total I/O In: -  Out: 600 [Stool:600]  PE: Gen: awake, alert Abd: Soft, appropriately tender. IR drain still bloody appearance, surgical drain with serous looking fluid. midline wound as pictured below with visible bowel and fascial dehiscence, no enteric drainage from midline. Ileostomy output thin and watery    Lab Results:  Recent Labs    02/01/24 0500 02/02/24 0520  WBC 10.0 8.4  HGB 7.3* 7.7*  HCT 23.0* 24.2*  PLT 226 193   BMET Recent Labs    02/01/24 0500 02/02/24 0520  NA 144 141  K 3.1* 3.4*  CL 108 109  CO2 23 21*  GLUCOSE 134* 108*  BUN 77* 64*  CREATININE 4.94* 3.96*  CALCIUM  7.5* 8.0*   PT/INR No results for input(s): "LABPROT", "INR" in the last 72 hours. CMP     Component Value Date/Time   NA 141 02/02/2024 0520   NA 141 01/22/2020 1137   K 3.4 (L) 02/02/2024 0520   CL 109 02/02/2024 0520   CO2 21 (L) 02/02/2024 0520   GLUCOSE 108 (H) 02/02/2024 0520   BUN 64 (H) 02/02/2024 0520   BUN 13 01/22/2020 1137   CREATININE 3.96 (H) 02/02/2024 0520   CREATININE 0.98 06/14/2017 0935   CALCIUM  8.0 (L) 02/02/2024 0520   PROT 5.9 (L) 01/30/2024 0445   PROT 7.3 01/22/2020 1137   ALBUMIN  <1.5 (L) 02/02/2024 0520   ALBUMIN  4.2 01/22/2020 1137   AST 39 01/30/2024 0445   ALT 44 01/30/2024 0445    ALKPHOS 56 01/30/2024 0445   BILITOT 1.4 (H) 01/30/2024 0445   BILITOT 0.4 01/22/2020 1137   GFRNONAA 16 (L) 02/02/2024 0520   GFRNONAA >89 09/17/2014 1253   GFRAA 111 01/22/2020 1137   GFRAA >89 09/17/2014 1253   Lipase     Component Value Date/Time   LIPASE 22 09/28/2019 1109       Studies/Results: DG CHEST PORT 1 VIEW Result Date: 02/01/2024 CLINICAL DATA:  Check central line placement EXAM: PORTABLE CHEST 1 VIEW COMPARISON:  01/15/2024 FINDINGS: Cardiac shadow is mildly prominent but accentuated by the portable technique. Right jugular central line is noted in satisfactory position. Additionally a temporary dialysis catheter on the right is noted with the tip in the right atrium. No pneumothorax is seen. The overall inspiratory effort is poor with some basilar scarring particularly on the left. Old rib fractures are noted on the right and stable. IMPRESSION: No pneumothorax following central line placement. Stable left basilar scarring. Electronically Signed   By: Alexander [name] M.D.   On: 02/01/2024 19:36   VAS US  LOWER EXTREMITY VENOUS (DVT) Result Date: 02/01/2024  Lower Venous DVT Study Patient Name:  Alexander [name]  Date of Exam:   02/01/2024 Medical Rec #: [id number]      Accession #:    [id number] Date of  Birth: August 02, 1961      Patient Gender: M Patient Age:   63 years Exam Location:  [facility] Procedure:      VAS US  LOWER EXTREMITY VENOUS (DVT) Referring Phys: PRANAV PATEL --------------------------------------------------------------------------------  Indications: Edema.  Comparison Study: 07/10/2023- lower extremity venous duplex- right popliteal vein                   age indeterminate thrombus Performing Technologist: Delford Felling MHA, RDMS, RVT, RDCS  Examination Guidelines: A complete evaluation includes B-mode imaging, spectral Doppler, color Doppler, and power Doppler as needed of all accessible portions of each vessel. Bilateral testing is considered an integral part  of a complete examination. Limited examinations for reoccurring indications may be performed as noted. The reflux portion of the exam is performed with the patient in reverse Trendelenburg.  +---------+---------------+---------+-----------+----------+--------------+ RIGHT    CompressibilityPhasicitySpontaneityPropertiesThrombus Aging +---------+---------------+---------+-----------+----------+--------------+ CFV      Full           Yes      Yes                                 +---------+---------------+---------+-----------+----------+--------------+ SFJ      Full                                                        +---------+---------------+---------+-----------+----------+--------------+ FV Prox  Full                                                        +---------+---------------+---------+-----------+----------+--------------+ FV Mid   Full           Yes      Yes                                 +---------+---------------+---------+-----------+----------+--------------+ FV DistalFull                                                        +---------+---------------+---------+-----------+----------+--------------+ PFV      Full                                                        +---------+---------------+---------+-----------+----------+--------------+ POP      Full           Yes      Yes                                 +---------+---------------+---------+-----------+----------+--------------+ PTV      Full                                                        +---------+---------------+---------+-----------+----------+--------------+  PERO     Full                                                        +---------+---------------+---------+-----------+----------+--------------+   +---------+---------------+---------+-----------+----------+--------------+ LEFT     CompressibilityPhasicitySpontaneityPropertiesThrombus Aging  +---------+---------------+---------+-----------+----------+--------------+ CFV      Full           Yes      Yes                                 +---------+---------------+---------+-----------+----------+--------------+ SFJ      Full                                                        +---------+---------------+---------+-----------+----------+--------------+ FV Prox  Full                                                        +---------+---------------+---------+-----------+----------+--------------+ FV Mid   Full           Yes      Yes                                 +---------+---------------+---------+-----------+----------+--------------+ FV DistalFull                                                        +---------+---------------+---------+-----------+----------+--------------+ PFV      Full                                                        +---------+---------------+---------+-----------+----------+--------------+ POP      Full           Yes      Yes                                 +---------+---------------+---------+-----------+----------+--------------+ PTV      Full                                                        +---------+---------------+---------+-----------+----------+--------------+ PERO     Full                                                        +---------+---------------+---------+-----------+----------+--------------+  Summary: RIGHT: - There is no evidence of deep vein thrombosis in the lower extremity.  - No cystic structure found in the popliteal fossa.  LEFT: - There is no evidence of deep vein thrombosis in the lower extremity.  - No cystic structure found in the popliteal fossa.  *See table(s) above for measurements and observations. Electronically signed by Delaney Fearing on 02/01/2024 at 11:39:46 AM.    Final     Anti-infectives: Anti-infectives (From admission, onward)    Start     Dose/Rate Route  Frequency Ordered Stop   02/01/24 2000  DAPTOmycin (CUBICIN) IVPB 500 mg/39mL premix        6 mg/kg  76.5 kg 100 mL/hr over 30 Minutes Intravenous Every 48 hours 02/01/24 1853     01/31/24 2200  linezolid  (ZYVOX ) IVPB 600 mg  Status:  Discontinued        600 mg 300 mL/hr over 60 Minutes Intravenous Every 12 hours 01/31/24 2036 02/01/24 1853   01/28/24 1000  piperacillin -tazobactam (ZOSYN ) IVPB 2.25 g        2.25 g 100 mL/hr over 30 Minutes Intravenous Every 8 hours 01/28/24 0909     01/18/24 1400  piperacillin -tazobactam (ZOSYN ) IVPB 3.375 g  Status:  Discontinued        3.375 g 12.5 mL/hr over 240 Minutes Intravenous Every 6 hours 01/18/24 1241 01/18/24 1242   01/18/24 1400  piperacillin -tazobactam (ZOSYN ) IVPB 3.375 g        3.375 g 100 mL/hr over 30 Minutes Intravenous Every 6 hours 01/18/24 1242 01/23/24 2046   01/18/24 0000  piperacillin -tazobactam (ZOSYN ) IVPB 2.25 g  Status:  Discontinued        2.25 g 100 mL/hr over 30 Minutes Intravenous Every 6 hours 01/17/24 2120 01/18/24 1241   01/12/24 0200  piperacillin -tazobactam (ZOSYN ) IVPB 3.375 g  Status:  Discontinued        3.375 g 12.5 mL/hr over 240 Minutes Intravenous Every 8 hours 01/11/24 2120 01/17/24 2120   01/11/24 1815  piperacillin -tazobactam (ZOSYN ) IVPB 3.375 g        3.375 g 100 mL/hr over 30 Minutes Intravenous  Once 01/11/24 1806 01/11/24 1921        Assessment/Plan  LAPAROTOMY, EXPLORATORY RIGHT COLECTOMY (without anastomosis) PLACEMENT OF ABTHERA WOUND VAC DRAINAGE OF INTRA-ABDOMINAL ABSCESS  4/16 EW   RE-EXPLORATION ABDOMEN, PLACEMENT OF ABTHERA WOUND VAC, SMALL BOWEL RESECTION 01/16/2024 EW   Re-exploration abdomen, small bowel resection, creation of end ileostomy, abd closure, placement wound vac 4/19 EW   - TNA for nutritional support.  CT scan was certainly concerning for leak given presumed extravasation of contrast noted in this fluid collection however, drain is bloody right now with no succus  noted despite FLD. Hold on advancing diet for now - IR drain placed in suspect abscess/leak 4/30-output has been bloody but not purulent or enteric per se - Cont to monitor ostomy output - increased imodium and fiber - Completed post op abx, but WBC increasing, tachy, lethargic 4/29, with increasing temp-Zosyn  restarted  WBC normalized - fascial dehiscence without evisceration but concerned for wound, to go back to OR for attempt at bridge with mesh or retentions would be challenging at this point in post-op course. For now will place mepitel in base and cover with dry dressings. Apply binder and cut opening for ostomy appliance. May consider VAC placement with white foam in base tomorrow but will discuss with oncoming MD tomorrow AM.      FEN -trial full liquids today, fibercon  added 5/2 for thin high ostomy output VTE - SCDs, heparin  gtt held due to anemia ID - Completed post op abx. reinititiation of Zosyn  on 4/29, changed to zyvox  5/2   - Per TRH -  Pleural effusions Septic shock - resolved ABL anemia - monitor and transfuse as needed  Coagulopathy - resolved AKI - started CRRT 4/19,  defer to renal.  Holding HD as UOP is good and Cr improving Acute hypoxemic resp failure - extubated H/o VTE/PE/DVT - scds for now; was on coumadin    LOS: 22 days      Annetta Killian, [facility] 02/02/2024, 9:00 AM Please see Amion for pager number during day hours 7:00am-4:30pm
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
## 2024-02-02 NOTE — Progress Notes (Signed)
 [facility]                                                                               Alexander [name], is a 63 y.o. male, DOB - [birth year], ZOX:[id number] Admit date - 01/11/2024    Outpatient Primary MD for the patient is Alexander Iba, [name]  LOS - 22  days    Brief summary   63 year old African-American male with past medical history significant for pulmonary embolism on Coumadin  and HFpEF.  Patient presented to the hospital with BRBPR.  Patient was found to have colon perforation.  GI, general surgery were consulted.  Patient underwent ex lap followed by ICU stay.  Developed AKI requiring CRRT.  Intermittent hemodialysis was planned, however, this was not done as patient's renal function seems to be recovering.  Patient continues to make urine.  Serum creatinine has improved from 5.67-4.94 overnight.  Continue to monitor I's and O's.  Wound culture is growing Enterococcus faecium (VRE that is resistant to ampicillin and vancomycin).  Patient is currently on Zyvox .  Infectious disease team has been consulted.  Infectious disease team has suggested using daptomycin.   Significant event. 4/12 admitted for GI bleed and BRBPR.  GI consulted conservative measures.  EGD colonoscopy was recommended. 4/15 repeat CT scan shows evidence of concern for contained perforation in the ascending colon.  General surgery consulted.  Recommending ex lap. 4/16 transferred to ICU. 4/16 cardiorespiratory arrest, achieved ROSC. 4/17 back to the OR, wound VAC placement and small bowel resection. 4/19 Back to the OR s/p exploratory laparotomy, ileostomy creation, small bowel resection, wound VAC in place. nephrology consulted for CRRT 4/23 extubated; off pressors 4/25 Transferred from [facility] ICU to [facility] progressive due to need for hemodialysis. 4/29 triple-lumen CVC placement for TPN. 4/30 CT-guided drain placement.  Dark bloody output.  Anticoagulation on hold.  Also HD catheter placement on right  with plan for inpatient HD. 5/2.  Diet advanced to clear liquid diet.   Assessment & Plan    Assessment and Plan:  Spontaneous perforation of colon SP right colectomy. Underwent 3 ex lap as above, SP right hemicolectomy and small bowel resection as well as ileostomy creation. Has a wound VAC. Currently has IV TPN. Management per general surgery. 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 Current plan is conservative management with a drain and a controlled fistula. Bowel rest recommended. NG recommended-patient refused.  Surgery recommended to advance to [facility].  Monitor.   AKI  Initially required CRRT. Suspect combination of shock with ATN. Possibility of IV contrast injury also cannot be ruled out. Continues to third space.  Reports of arthralgia. Urine output is actually improving.  Nephrology recommend to hold off on HD. Currently has a temp HD catheter. Creatinine improving.    Acute blood loss anemia as well as anemia of critical illness Iron  deficiency anemia.  B12 deficiency. Presented with BRBPR.  Hemoglobin at baseline around 10.   Hemoglobin stable around 7. Transfuse to keep it greater than 7.     ENTEROCOCCUS FAECIUM infection. Drain culture is growing E faecium.  Sensitivity currently pending.   Given clinical improvement for now I will continue with IV Zosyn  although pending sensitivity or if decompensates again may require vancomycin. Abdominal wound cultures growing VRE (resistant to ampicillin and vancomycin).   Multifactorial shock. Patient has needed vasopressors throughout the hospital stay multiple times. After aggressive IV hydration as well as IV albumin  blood pressure has improved. Monitor.   Septic shock.  Present on admission. Sepsis physiology appears to have resolved for now.   Hypotension. Resolved.    History of PE.  07/2023. Presented with BRBPR.  Anticoagulation has been on hold.  Was resumed with heparin . Given anemia seen again heparin  is on hold. Especially in the setting of drain output being bloody. Remains at risk for recurrent PE.   Acute hypoxic respiratory failure Requiring intubation. Was extubated on 4/23.  Intermittently on oxygen.  Continue incentive spirometry.   Diverticulitis Treated before.   Hypokalemia Replaced.    Agitation. Likely hospital induced delirium. Monitor.  As needed Ativan .   Hypoglycemia. As the patient was not able to get his TPN his blood sugars were low. Treated with D10.  Now better.   Acute right knee pain. Concern for gout. Reports pins-and-needles like pain in his right knee. Suspect arthralgia in the setting of need for HD. X-ray unremarkable. Lower extremity venous Doppler ordered. Now appears with swelling and warmth.  No significant redness so far.  Uric acid is normal.  CRP ESR elevated although it could be multifactorial.  Not a candidate for IV steroids, IV Toradol, other oral NSAIDs or colchicine .  Continue pain control for now. Reports no pain as of 5/2.   Hiccups. As needed Thorazine .      Code Status: full code.  DVT Prophylaxis:  SCD's Start: 01/15/24 1400   Level of Care: Level of care: Progressive Family Communication: none at bedside.   Disposition Plan:     Remains inpatient appropriate:  pending clinical improvement.   Procedures:  None    Consultants:   Infectious disease General surgery Nephrology.  IR.   Antimicrobials:   Anti-infectives (From admission, onward)    Start     Dose/Rate Route Frequency Ordered Stop   02/02/24 1230  Ampicillin-Sulbactam (UNASYN) 3 g in sodium chloride  0.9 % 100 mL IVPB        3 g 200 mL/hr over 30 Minutes Intravenous Every 12 hours 02/02/24 1208     02/01/24 2000  DAPTOmycin (CUBICIN) IVPB 500 mg/74mL premix        6 mg/kg  76.5 kg 100 mL/hr over 30 Minutes Intravenous  Every 48 hours 02/01/24 1853     01/31/24 2200  linezolid  (ZYVOX ) IVPB 600 mg  Status:  Discontinued        600 mg 300 mL/hr over 60 Minutes Intravenous Every 12 hours 01/31/24 2036 02/01/24 1853   01/28/24 1000  piperacillin -tazobactam (ZOSYN ) IVPB 2.25 g  Status:  Discontinued        2.25 g 100 mL/hr over 30 Minutes Intravenous Every 8 hours 01/28/24 0909 02/02/24 1208   01/18/24 1400  piperacillin -tazobactam (ZOSYN ) IVPB 3.375 g  Status:  Discontinued        3.375 g 12.5 mL/hr over 240 Minutes Intravenous Every 6 hours 01/18/24 1241 01/18/24 1242   01/18/24 1400  piperacillin -tazobactam (ZOSYN ) IVPB 3.375 g        3.375 g 100 mL/hr over 30 Minutes Intravenous Every 6 hours 01/18/24 1242 01/23/24 2046   01/18/24 0000  piperacillin -tazobactam (ZOSYN ) IVPB 2.25 g  Status:  Discontinued        2.25 g 100 mL/hr over 30 Minutes Intravenous Every 6 hours 01/17/24 2120 01/18/24 1241   01/12/24 0200  piperacillin -tazobactam (ZOSYN ) IVPB 3.375 g  Status:  Discontinued        3.375 g 12.5 mL/hr over 240 Minutes Intravenous Every 8 hours 01/11/24 2120 01/17/24 2120   01/11/24 1815  piperacillin -tazobactam (ZOSYN ) IVPB 3.375 g        3.375 g 100 mL/hr over 30 Minutes Intravenous  Once 01/11/24 1806 01/11/24 1921        Medications  Scheduled Meds:  Chlorhexidine  Gluconate Cloth  6 each Topical Daily   diclofenac  Sodium  2 g Topical QID   feeding supplement  237 mL Oral BID BM   [START ON 02/03/2024] ferrous sulfate  325 mg Oral Q breakfast   loperamide  4 mg Oral TID   mouth rinse  15 mL Mouth Rinse 4 times per day   pantoprazole  (PROTONIX ) IV  40 mg Intravenous Q12H   polycarbophil  1,250 mg Oral BID   sodium chloride  flush  5 mL Intracatheter Q8H   sucralfate   1 g Oral BID   Continuous Infusions:  sodium chloride  Stopped (01/24/24 2213)   ampicillin-sulbactam (UNASYN) IV     chlorproMAZINE  (THORAZINE ) 12.5 mg in sodium chloride  0.9 % 25 mL IVPB     DAPTOmycin Stopped (02/01/24  2052)   heparin  2,100 Units/hr (02/02/24 1254)   TPN ADULT (ION) 65 mL/hr at 02/02/24 1200   TPN ADULT (ION)     PRN Meds:.sodium chloride , alum & mag hydroxide-simeth, chlorproMAZINE  (THORAZINE ) 12.5 mg in sodium chloride  0.9 % 25 mL IVPB, HYDROmorphone  (DILAUDID ) injection **OR** HYDROmorphone  (DILAUDID ) injection, ipratropium-albuterol , lip balm, LORazepam , methocarbamol  (ROBAXIN ) injection, ondansetron  (ZOFRAN ) IV, mouth rinse, oxyCODONE     Subjective:   Orlan Rychlik was seen and examined today.    Objective:   Vitals:   02/02/24 0257 02/02/24 0513 02/02/24 0746 02/02/24 1033  BP: (!) 150/67  (!) 147/79 135/75  Pulse: 83   89  Resp: 13  18 16   Temp: 98.9 F (37.2 C)  99.1 F (37.3 C) 98.7 F (37.1 C)  TempSrc: Oral  Oral Oral  SpO2: 97%  98% 99%  Weight:  75.5 kg    Height:        Intake/Output Summary (Last 24 hours) at 02/02/2024 1501 Last data filed at 02/02/2024 1200 Gross per 24 hour  Intake 3737.34 ml  Output 4260 ml  Net -522.66 ml   Filed Weights   01/31/24 0500 02/01/24 0600 02/02/24 0513  Weight: 80.1 kg 76.5 kg 75.5 kg     Exam General exam: Appears calm and comfortable  Respiratory system: Clear to auscultation. Respiratory effort normal. Cardiovascular system: S1 & S2 heard, RRR. No JVD,  Gastrointestinal system: Abdomen is soft, drain in place.   Central nervous system: Alert and oriented.  Skin: No rashes,  Psychiatry: Mood & affect appropriate.    Data Reviewed:  I have personally reviewed following labs and imaging studies   CBC Lab Results  Component Value Date   WBC 8.4 02/02/2024   RBC 2.63 (L) 02/02/2024   HGB 7.7 (L) 02/02/2024   HCT 24.2 (L) 02/02/2024   MCV 92.0 02/02/2024   MCH 29.3 02/02/2024   PLT 193 02/02/2024   MCHC 31.8 02/02/2024   RDW 17.7 (H) 02/02/2024   LYMPHSABS 1.3 01/29/2024   MONOABS 1.9 (H) 01/29/2024   EOSABS 0.3 01/29/2024   BASOSABS 0.1 01/29/2024  Last metabolic panel Lab Results  Component  Value Date   NA 141 02/02/2024   K 3.4 (L) 02/02/2024   CL 109 02/02/2024   CO2 21 (L) 02/02/2024   BUN 64 (H) 02/02/2024   CREATININE 3.96 (H) 02/02/2024   GLUCOSE 108 (H) 02/02/2024   GFRNONAA 16 (L) 02/02/2024   GFRAA 111 01/22/2020   CALCIUM  8.0 (L) 02/02/2024   PHOS 2.9 02/02/2024   PROT 5.9 (L) 01/30/2024   ALBUMIN  <1.5 (L) 02/02/2024   LABGLOB 3.1 01/22/2020   AGRATIO 1.4 01/22/2020   BILITOT 1.4 (H) 01/30/2024   ALKPHOS 56 01/30/2024   AST 39 01/30/2024   ALT 44 01/30/2024   ANIONGAP 11 02/02/2024    CBG (last 3)  Recent Labs    01/30/24 1526 01/30/24 2007 01/30/24 2342  GLUCAP 142* 130* 133*      Coagulation Profile: Recent Labs  Lab 01/27/24 0045  INR 1.2     Radiology Studies: DG CHEST PORT 1 VIEW Result Date: 02/01/2024 CLINICAL DATA:  Check central line placement EXAM: PORTABLE CHEST 1 VIEW COMPARISON:  01/15/2024 FINDINGS: Cardiac shadow is mildly prominent but accentuated by the portable technique. Right jugular central line is noted in satisfactory position. Additionally a temporary dialysis catheter on the right is noted with the tip in the right atrium. No pneumothorax is seen. The overall inspiratory effort is poor with some basilar scarring particularly on the left. Old rib fractures are noted on the right and stable. IMPRESSION: No pneumothorax following central line placement. Stable left basilar scarring. Electronically Signed   By: Violeta Grey M.D.   On: 02/01/2024 19:36   VAS US  LOWER EXTREMITY VENOUS (DVT) Result Date: 02/01/2024  Lower Venous DVT Study Patient Name:  SHAH CHARON  Date of Exam:   02/01/2024 Medical Rec #: [id number]      Accession #:    [id number] Date of Birth: March 25, 1961      Patient Gender: M Patient Age:   77 years Exam Location:  [facility] Procedure:      VAS US  LOWER EXTREMITY VENOUS (DVT) Referring Phys: PRANAV PATEL --------------------------------------------------------------------------------  Indications:  Edema.  Comparison Study: 07/10/2023- lower extremity venous duplex- right popliteal vein                   age indeterminate thrombus Performing Technologist: Delford Felling MHA, RDMS, RVT, RDCS  Examination Guidelines: A complete evaluation includes B-mode imaging, spectral Doppler, color Doppler, and power Doppler as needed of all accessible portions of each vessel. Bilateral testing is considered an integral part of a complete examination. Limited examinations for reoccurring indications may be performed as noted. The reflux portion of the exam is performed with the patient in reverse Trendelenburg.  +---------+---------------+---------+-----------+----------+--------------+ RIGHT    CompressibilityPhasicitySpontaneityPropertiesThrombus Aging +---------+---------------+---------+-----------+----------+--------------+ CFV      Full           Yes      Yes                                 +---------+---------------+---------+-----------+----------+--------------+ SFJ      Full                                                        +---------+---------------+---------+-----------+----------+--------------+ FV Prox  Full                                                        +---------+---------------+---------+-----------+----------+--------------+  FV Mid   Full           Yes      Yes                                 +---------+---------------+---------+-----------+----------+--------------+ FV DistalFull                                                        +---------+---------------+---------+-----------+----------+--------------+ PFV      Full                                                        +---------+---------------+---------+-----------+----------+--------------+ POP      Full           Yes      Yes                                 +---------+---------------+---------+-----------+----------+--------------+ PTV      Full                                                         +---------+---------------+---------+-----------+----------+--------------+ PERO     Full                                                        +---------+---------------+---------+-----------+----------+--------------+   +---------+---------------+---------+-----------+----------+--------------+ LEFT     CompressibilityPhasicitySpontaneityPropertiesThrombus Aging +---------+---------------+---------+-----------+----------+--------------+ CFV      Full           Yes      Yes                                 +---------+---------------+---------+-----------+----------+--------------+ SFJ      Full                                                        +---------+---------------+---------+-----------+----------+--------------+ FV Prox  Full                                                        +---------+---------------+---------+-----------+----------+--------------+ FV Mid   Full           Yes      Yes                                 +---------+---------------+---------+-----------+----------+--------------+  FV DistalFull                                                        +---------+---------------+---------+-----------+----------+--------------+ PFV      Full                                                        +---------+---------------+---------+-----------+----------+--------------+ POP      Full           Yes      Yes                                 +---------+---------------+---------+-----------+----------+--------------+ PTV      Full                                                        +---------+---------------+---------+-----------+----------+--------------+ PERO     Full                                                        +---------+---------------+---------+-----------+----------+--------------+     Summary: RIGHT: - There is no evidence of deep vein thrombosis in the lower extremity.   - No cystic structure found in the popliteal fossa.  LEFT: - There is no evidence of deep vein thrombosis in the lower extremity.  - No cystic structure found in the popliteal fossa.  *See table(s) above for measurements and observations. Electronically signed by Delaney Fearing on 02/01/2024 at 11:39:46 AM.    Final        Feliciana Horn M.D. [facility] 02/02/2024, 3:01 PM  Available via Epic secure chat 7am-7pm After 7 pm, please refer to night coverage provider listed on amion.

## 2024-02-03 DIAGNOSIS — D62 Acute posthemorrhagic anemia: Secondary | ICD-10-CM | POA: Diagnosis not present

## 2024-02-03 DIAGNOSIS — K631 Perforation of intestine (nontraumatic): Secondary | ICD-10-CM | POA: Diagnosis not present

## 2024-02-03 DIAGNOSIS — I10 Essential (primary) hypertension: Secondary | ICD-10-CM | POA: Diagnosis not present

## 2024-02-03 DIAGNOSIS — Z86711 Personal history of pulmonary embolism: Secondary | ICD-10-CM | POA: Diagnosis not present

## 2024-02-03 LAB — COMPREHENSIVE METABOLIC PANEL WITH GFR
ALT: 53 U/L — ABNORMAL HIGH (ref 0–44)
AST: 40 U/L (ref 15–41)
Albumin: <1.5 g/dL — ABNORMAL LOW (ref 3.5–5.0)
Alkaline Phosphatase: 70 U/L (ref 38–126)
Anion gap: 10 (ref 5–15)
BUN: 54 mg/dL — ABNORMAL HIGH (ref 8–23)
CO2: 21 mmol/L — ABNORMAL LOW (ref 22–32)
Calcium: 8.1 mg/dL — ABNORMAL LOW (ref 8.9–10.3)
Chloride: 110 mmol/L (ref 98–111)
Creatinine, Ser: 3.15 mg/dL — ABNORMAL HIGH (ref 0.61–1.24)
GFR, Estimated: 22 mL/min — ABNORMAL LOW (ref >60)
Glucose, Bld: 123 mg/dL — ABNORMAL HIGH (ref 70–99)
Potassium: 4.2 mmol/L (ref 3.5–5.1)
Sodium: 141 mmol/L (ref 135–145)
Total Bilirubin: 0.7 mg/dL (ref 0.0–1.2)
Total Protein: 6.7 g/dL (ref 6.5–8.1)

## 2024-02-03 LAB — HEPARIN LEVEL (UNFRACTIONATED)
Heparin Unfractionated: 0.4 [IU]/mL (ref 0.30–0.70)
Heparin Unfractionated: 0.29 [IU]/mL — ABNORMAL LOW (ref 0.30–0.70)

## 2024-02-03 LAB — MAGNESIUM: Magnesium: 1.9 mg/dL (ref 1.7–2.4)

## 2024-02-03 LAB — CBC
HCT: 23.7 % — ABNORMAL LOW (ref 39.0–52.0)
Hemoglobin: 7.4 g/dL — ABNORMAL LOW (ref 13.0–17.0)
MCH: 29.4 pg (ref 26.0–34.0)
MCHC: 31.2 g/dL (ref 30.0–36.0)
MCV: 94 fL (ref 80.0–100.0)
Platelets: 174 10*3/uL (ref 150–400)
RBC: 2.52 MIL/uL — ABNORMAL LOW (ref 4.22–5.81)
RDW: 17.4 % — ABNORMAL HIGH (ref 11.5–15.5)
WBC: 7 10*3/uL (ref 4.0–10.5)
nRBC: 0 % (ref 0.0–0.2)

## 2024-02-03 LAB — RENAL FUNCTION PANEL
Albumin: <1.5 g/dL — ABNORMAL LOW (ref 3.5–5.0)
Anion gap: 9 (ref 5–15)
BUN: 54 mg/dL — ABNORMAL HIGH (ref 8–23)
CO2: 21 mmol/L — ABNORMAL LOW (ref 22–32)
Calcium: 7.9 mg/dL — ABNORMAL LOW (ref 8.9–10.3)
Chloride: 108 mmol/L (ref 98–111)
Creatinine, Ser: 3.05 mg/dL — ABNORMAL HIGH (ref 0.61–1.24)
GFR, Estimated: 22 mL/min — ABNORMAL LOW (ref >60)
Glucose, Bld: 125 mg/dL — ABNORMAL HIGH (ref 70–99)
Phosphorus: 3.3 mg/dL (ref 2.5–4.6)
Potassium: 4.1 mmol/L (ref 3.5–5.1)
Sodium: 138 mmol/L (ref 135–145)

## 2024-02-03 LAB — PROTIME-INR
INR: 1.1 (ref 0.8–1.2)
Prothrombin Time: 14.6 s (ref 11.4–15.2)

## 2024-02-03 LAB — TRIGLYCERIDES: Triglycerides: 48 mg/dL (ref <150)

## 2024-02-03 LAB — PHOSPHORUS: Phosphorus: 3.2 mg/dL (ref 2.5–4.6)

## 2024-02-03 LAB — CK: Total CK: 22 U/L — ABNORMAL LOW (ref 49–397)

## 2024-02-03 MED ORDER — ADULT MULTIVITAMIN W/MINERALS CH
1.0000 | ORAL_TABLET | Freq: Every day | ORAL | Status: DC
  Administered 2024-02-03 – 2024-02-10 (×8): 1 via ORAL
  Filled 2024-02-03 (×8): qty 1

## 2024-02-03 MED ORDER — TRAVASOL 10 % IV SOLN
INTRAVENOUS | Status: DC
  Filled 2024-02-03: qty 1060.8

## 2024-02-03 MED ORDER — HEPARIN (PORCINE) 25000 UT/250ML-% IV SOLN
2100.0000 [IU]/h | INTRAVENOUS | Status: AC
  Administered 2024-02-03: 2200 [IU]/h via INTRAVENOUS
  Administered 2024-02-03: 2100 [IU]/h via INTRAVENOUS
  Administered 2024-02-04 – 2024-02-05 (×3): 2200 [IU]/h via INTRAVENOUS
  Filled 2024-02-03 (×5): qty 250

## 2024-02-03 MED ORDER — PANTOPRAZOLE SODIUM 40 MG PO TBEC
40.0000 mg | DELAYED_RELEASE_TABLET | Freq: Two times a day (BID) | ORAL | Status: DC
  Administered 2024-02-03 – 2024-02-10 (×15): 40 mg via ORAL
  Filled 2024-02-03 (×15): qty 1

## 2024-02-03 MED ORDER — SODIUM CHLORIDE 0.9 % IV SOLN
10.0000 mg/kg | INTRAVENOUS | Status: DC
  Administered 2024-02-03: 800 mg via INTRAVENOUS
  Filled 2024-02-03 (×2): qty 16

## 2024-02-03 MED ORDER — TRAVASOL 10 % IV SOLN
INTRAVENOUS | Status: AC
  Filled 2024-02-03: qty 561.6

## 2024-02-03 NOTE — Progress Notes (Signed)
 Physical Therapy Treatment Patient Details Name: Alexander [name] MRN: [id number] DOB: 12/22/1960 Today's Date: 02/03/2024   History of Present Illness Alexander [name] is a 63 y.o. male admitted to [facility] on 01/11/2024 with rectal bleeding and hypotension, now S/P exploratory laparotomy where he was found to have a perforated ascending colon with intra-abdominal abscess, right colectomy without anastomosis, placement of wound VAC, and drainage of intra-abdominal abscesses. CRRT started 4/19- 01/23/24 .   PMH: PE/DVT on Coumadin , GERD, R ankle arthroplasty    PT Comments  Pt eager to get up OOB. Focused on sit to stands in stedy and LE strengthening in chair. Pt remains to have open abdominal wound limiting pt's ability to maintain upright position in sitting and standing due to poor core strength. Acute PT to cont to follow.    If plan is discharge home, recommend the following: Two people to help with walking and/or transfers;Two people to help with bathing/dressing/bathroom   Can travel by private vehicle        Equipment Recommendations   (TBA)    Recommendations for Other Services       Precautions / Restrictions Precautions Precautions: Fall;Other (comment) Recall of Precautions/Restrictions: Impaired Precaution/Restrictions Comments: abdominal incision dehisced Required Braces or Orthoses:  (abdominal binder) Restrictions Weight Bearing Restrictions Per Provider Order: No     Mobility  Bed Mobility Overal bed mobility: Needs Assistance Bed Mobility: Rolling, Sidelying to Sit Rolling: Mod assist Sidelying to sit: Min assist       General bed mobility comments: educated pt on log roll technique for abdominal comfort with pt needing mod A to roll to L and min A to come to sitting with pt demonstrating good use of UEs, able to scoot out to EOB with min A, some posterior lean initially with pt able to correct    Transfers Overall transfer level: Needs assistance Equipment used: 2  person hand held assist, Ambulation equipment used Transfers: Sit to/from Stand, Bed to chair/wheelchair/BSC Sit to Stand: +2 physical assistance, Mod assist, From elevated surface           General transfer comment: max encouragement to give max effort, pt with self limiting tendencies however cooperative. Pt minA to power up from seat on stedy Transfer via Lift Equipment: Stedy  Ambulation/Gait               General Gait Details: unable   Stairs             Wheelchair Mobility     Tilt Bed    Modified Rankin (Stroke Patients Only)       Balance Overall balance assessment: Needs assistance Sitting-balance support: Feet supported, Bilateral upper extremity supported Sitting balance-Leahy Scale: Fair Sitting balance - Comments: pt requiring support of bilat UEs, pt with difficulty maintaining upright posture and and head up as expected due to large abdominal open wound   Standing balance support: Bilateral upper extremity supported, Reliant on assistive device for balance Standing balance-Leahy Scale: Zero Standing balance comment: relies on UEs in stedy                            Communication Communication Communication: Impaired Factors Affecting Communication: Reduced clarity of speech  Cognition Arousal: Alert Behavior During Therapy: WFL for tasks assessed/performed   PT - Cognitive impairments: Attention, Initiation, Sequencing, Orientation   Orientation impairments: Time  PT - Cognition Comments: pt initially agitated regarding care and medication however once offered to assist pt out of bed pt participatory and cooperative Following commands: Impaired Following commands impaired: Follows one step commands with increased time    Cueing Cueing Techniques: Verbal cues, Tactile cues, Visual cues  Exercises General Exercises - Lower Extremity Long Arc Quad: AROM, Right, Left, 10 reps, Seated (with end range  hold x 5 sec) Hip Flexion/Marching: AROM, Right, Left, 10 reps, Seated Other Exercises Other Exercises: worked on sit to stand from [employer] comments (skin integrity, edema, etc.): VSS on RA      Pertinent Vitals/Pain Pain Assessment Pain Assessment: Faces Faces Pain Scale: Hurts little more Pain Location: abdomen    Home Living                          Prior Function            PT Goals (current goals can now be found in the care plan section) Acute Rehab PT Goals PT Goal Formulation: With patient Time For Goal Achievement: 02/06/24 Potential to Achieve Goals: Fair Progress towards PT goals: Progressing toward goals    Frequency    Min 2X/week      PT Plan      Co-evaluation              AM-PAC PT "6 Clicks" Mobility   Outcome Measure  Help needed turning from your back to your side while in a flat bed without using bedrails?: Total Help needed moving from lying on your back to sitting on the side of a flat bed without using bedrails?: Total Help needed moving to and from a bed to a chair (including a wheelchair)?: Total Help needed standing up from a chair using your arms (e.g., wheelchair or bedside chair)?: Total Help needed to walk in hospital room?: Total Help needed climbing 3-5 steps with a railing? : Total 6 Click Score: 6    End of Session Equipment Utilized During Treatment:  (abdominal brace) Activity Tolerance: Patient tolerated treatment well Patient left: in chair;with call bell/phone within reach;with chair alarm set;Other (comment);with nursing/sitter in room Nurse Communication: Mobility status;Need for lift equipment (stedy +2 for back to bed) PT Visit Diagnosis: Unsteadiness on feet (R26.81);Muscle weakness (generalized) (M62.81);Other symptoms and signs involving the nervous system (R29.898);Difficulty in walking, not elsewhere classified (R26.2)     Time: 0981-1914 PT Time Calculation (min)  (ACUTE ONLY): 35 min  Charges:    $Therapeutic Exercise: 8-22 mins $Therapeutic Activity: 8-22 mins PT General Charges $$ ACUTE PT VISIT: 1 Visit                     Renaee Caro, PT, DPT Acute Rehabilitation Services Secure chat preferred Office #: [phone]    Jenna Moan 02/03/2024, 9:33 PM

## 2024-02-03 NOTE — Progress Notes (Signed)
 Progress Note  16 Days Post-Op  Subjective: No complaints this am.  Hungry.    Objective: Vital signs in last 24 hours: Temp:  [98.5 F (36.9 C)-99.2 F (37.3 C)] 98.5 F (36.9 C) (05/05 0715) Pulse Rate:  [83-99] 84 (05/05 0715) Resp:  [14-19] 17 (05/05 0715) BP: (132-147)/(73-81) 135/76 (05/05 0715) SpO2:  [99 %-100 %] 100 % (05/05 0715) Weight:  [75.6 kg] 75.6 kg (05/05 0628) Last BM Date : 02/02/24  Intake/Output from previous day: 05/04 0701 - 05/05 0700 In: 2675.8 [I.V.:2075.9; IV Piggyback:599.9] Out: 5295 [Urine:3500; Drains:75; Stool:1720] Intake/Output this shift: Total I/O In: 185.5 [I.V.:185.5] Out: 50 [Stool:50]  PE: Gen: awake, alert Abd: Soft, appropriately tender. IR drain still bloody appearance, surgical drain with serous looking fluid. midline wound as pictured below with visible bowel and fascial dehiscence, no enteric drainage from midline. Ileostomy output thin and watery    Lab Results:  Recent Labs    02/02/24 0520 02/03/24 0340  WBC 8.4 7.0  HGB 7.7* 7.4*  HCT 24.2* 23.7*  PLT 193 174   BMET Recent Labs    02/02/24 0520 02/03/24 0340  NA 141 141  138  K 3.4* 4.2  4.1  CL 109 110  108  CO2 21* 21*  21*  GLUCOSE 108* 123*  125*  BUN 64* 54*  54*  CREATININE 3.96* 3.15*  3.05*  CALCIUM  8.0* 8.1*  7.9*   PT/INR No results for input(s): "LABPROT", "INR" in the last 72 hours. CMP     Component Value Date/Time   NA 141 02/03/2024 0340   NA 138 02/03/2024 0340   NA 141 01/22/2020 1137   K 4.2 02/03/2024 0340   K 4.1 02/03/2024 0340   CL 110 02/03/2024 0340   CL 108 02/03/2024 0340   CO2 21 (L) 02/03/2024 0340   CO2 21 (L) 02/03/2024 0340   GLUCOSE 123 (H) 02/03/2024 0340   GLUCOSE 125 (H) 02/03/2024 0340   BUN 54 (H) 02/03/2024 0340   BUN 54 (H) 02/03/2024 0340   BUN 13 01/22/2020 1137   CREATININE 3.15 (H) 02/03/2024 0340   CREATININE 3.05 (H) 02/03/2024 0340   CREATININE 0.98 06/14/2017 0935   CALCIUM  8.1  (L) 02/03/2024 0340   CALCIUM  7.9 (L) 02/03/2024 0340   PROT 6.7 02/03/2024 0340   PROT 7.3 01/22/2020 1137   ALBUMIN  <1.5 (L) 02/03/2024 0340   ALBUMIN  <1.5 (L) 02/03/2024 0340   ALBUMIN  4.2 01/22/2020 1137   AST 40 02/03/2024 0340   ALT 53 (H) 02/03/2024 0340   ALKPHOS 70 02/03/2024 0340   BILITOT 0.7 02/03/2024 0340   BILITOT 0.4 01/22/2020 1137   GFRNONAA 22 (L) 02/03/2024 0340   GFRNONAA 22 (L) 02/03/2024 0340   GFRNONAA >89 09/17/2014 1253   GFRAA 111 01/22/2020 1137   GFRAA >89 09/17/2014 1253   Lipase     Component Value Date/Time   LIPASE 22 09/28/2019 1109       Studies/Results: DG CHEST PORT 1 VIEW Result Date: 02/01/2024 CLINICAL DATA:  Check central line placement EXAM: PORTABLE CHEST 1 VIEW COMPARISON:  01/15/2024 FINDINGS: Cardiac shadow is mildly prominent but accentuated by the portable technique. Right jugular central line is noted in satisfactory position. Additionally a temporary dialysis catheter on the right is noted with the tip in the right atrium. No pneumothorax is seen. The overall inspiratory effort is poor with some basilar scarring particularly on the left. Old rib fractures are noted on the right and stable. IMPRESSION: No pneumothorax  following central line placement. Stable left basilar scarring. Electronically Signed   By: Violeta Grey M.D.   On: 02/01/2024 19:36   VAS US  LOWER EXTREMITY VENOUS (DVT) Result Date: 02/01/2024  Lower Venous DVT Study Patient Name:  Alexander [name]  Date of Exam:   02/01/2024 Medical Rec #: [id number]      Accession #:    [id number] Date of Birth: 24-May-1961      Patient Gender: M Patient Age:   63 years Exam Location:  [facility] Procedure:      VAS US  LOWER EXTREMITY VENOUS (DVT) Referring Phys: PRANAV PATEL --------------------------------------------------------------------------------  Indications: Edema.  Comparison Study: 07/10/2023- lower extremity venous duplex- right popliteal vein                   age  indeterminate thrombus Performing Technologist: Delford Felling MHA, RDMS, RVT, RDCS  Examination Guidelines: A complete evaluation includes B-mode imaging, spectral Doppler, color Doppler, and power Doppler as needed of all accessible portions of each vessel. Bilateral testing is considered an integral part of a complete examination. Limited examinations for reoccurring indications may be performed as noted. The reflux portion of the exam is performed with the patient in reverse Trendelenburg.  +---------+---------------+---------+-----------+----------+--------------+ RIGHT    CompressibilityPhasicitySpontaneityPropertiesThrombus Aging +---------+---------------+---------+-----------+----------+--------------+ CFV      Full           Yes      Yes                                 +---------+---------------+---------+-----------+----------+--------------+ SFJ      Full                                                        +---------+---------------+---------+-----------+----------+--------------+ FV Prox  Full                                                        +---------+---------------+---------+-----------+----------+--------------+ FV Mid   Full           Yes      Yes                                 +---------+---------------+---------+-----------+----------+--------------+ FV DistalFull                                                        +---------+---------------+---------+-----------+----------+--------------+ PFV      Full                                                        +---------+---------------+---------+-----------+----------+--------------+ POP      Full           Yes      Yes                                 +---------+---------------+---------+-----------+----------+--------------+  PTV      Full                                                        +---------+---------------+---------+-----------+----------+--------------+  PERO     Full                                                        +---------+---------------+---------+-----------+----------+--------------+   +---------+---------------+---------+-----------+----------+--------------+ LEFT     CompressibilityPhasicitySpontaneityPropertiesThrombus Aging +---------+---------------+---------+-----------+----------+--------------+ CFV      Full           Yes      Yes                                 +---------+---------------+---------+-----------+----------+--------------+ SFJ      Full                                                        +---------+---------------+---------+-----------+----------+--------------+ FV Prox  Full                                                        +---------+---------------+---------+-----------+----------+--------------+ FV Mid   Full           Yes      Yes                                 +---------+---------------+---------+-----------+----------+--------------+ FV DistalFull                                                        +---------+---------------+---------+-----------+----------+--------------+ PFV      Full                                                        +---------+---------------+---------+-----------+----------+--------------+ POP      Full           Yes      Yes                                 +---------+---------------+---------+-----------+----------+--------------+ PTV      Full                                                        +---------+---------------+---------+-----------+----------+--------------+  PERO     Full                                                        +---------+---------------+---------+-----------+----------+--------------+     Summary: RIGHT: - There is no evidence of deep vein thrombosis in the lower extremity.  - No cystic structure found in the popliteal fossa.  LEFT: - There is no evidence of deep vein thrombosis  in the lower extremity.  - No cystic structure found in the popliteal fossa.  *See table(s) above for measurements and observations. Electronically signed by Delaney Fearing on 02/01/2024 at 11:39:46 AM.    Final     Anti-infectives: Anti-infectives (From admission, onward)    Start     Dose/Rate Route Frequency Ordered Stop   02/03/24 2000  DAPTOmycin (CUBICIN) 800 mg in sodium chloride  0.9 % IVPB        10 mg/kg  76.5 kg 132 mL/hr over 30 Minutes Intravenous Every 48 hours 02/03/24 0801     02/02/24 1230  Ampicillin-Sulbactam (UNASYN) 3 g in sodium chloride  0.9 % 100 mL IVPB        3 g 200 mL/hr over 30 Minutes Intravenous Every 12 hours 02/02/24 1208     02/01/24 2000  DAPTOmycin (CUBICIN) IVPB 500 mg/50mL premix  Status:  Discontinued        6 mg/kg  76.5 kg 100 mL/hr over 30 Minutes Intravenous Every 48 hours 02/01/24 1853 02/03/24 0801   01/31/24 2200  linezolid  (ZYVOX ) IVPB 600 mg  Status:  Discontinued        600 mg 300 mL/hr over 60 Minutes Intravenous Every 12 hours 01/31/24 2036 02/01/24 1853   01/28/24 1000  piperacillin -tazobactam (ZOSYN ) IVPB 2.25 g  Status:  Discontinued        2.25 g 100 mL/hr over 30 Minutes Intravenous Every 8 hours 01/28/24 0909 02/02/24 1208   01/18/24 1400  piperacillin -tazobactam (ZOSYN ) IVPB 3.375 g  Status:  Discontinued        3.375 g 12.5 mL/hr over 240 Minutes Intravenous Every 6 hours 01/18/24 1241 01/18/24 1242   01/18/24 1400  piperacillin -tazobactam (ZOSYN ) IVPB 3.375 g        3.375 g 100 mL/hr over 30 Minutes Intravenous Every 6 hours 01/18/24 1242 01/23/24 2046   01/18/24 0000  piperacillin -tazobactam (ZOSYN ) IVPB 2.25 g  Status:  Discontinued        2.25 g 100 mL/hr over 30 Minutes Intravenous Every 6 hours 01/17/24 2120 01/18/24 1241   01/12/24 0200  piperacillin -tazobactam (ZOSYN ) IVPB 3.375 g  Status:  Discontinued        3.375 g 12.5 mL/hr over 240 Minutes Intravenous Every 8 hours 01/11/24 2120 01/17/24 2120   01/11/24 1815   piperacillin -tazobactam (ZOSYN ) IVPB 3.375 g        3.375 g 100 mL/hr over 30 Minutes Intravenous  Once 01/11/24 1806 01/11/24 1921        Assessment/Plan  LAPAROTOMY, EXPLORATORY RIGHT COLECTOMY (without anastomosis) PLACEMENT OF ABTHERA WOUND VAC DRAINAGE OF INTRA-ABDOMINAL ABSCESS  4/16 EW   RE-EXPLORATION ABDOMEN, PLACEMENT OF ABTHERA WOUND VAC, SMALL BOWEL RESECTION 01/16/2024 EW   Re-exploration abdomen, small bowel resection, creation of end ileostomy, abd closure, placement wound vac 4/19 EW   - TNA for nutritional support.  CT scan was certainly concerning for leak given presumed  extravasation of contrast noted in this fluid collection however, drain is bloody right now with no succus noted despite FLD. Will allow regular diet today and monitor.  Can start to wean TNA - IR drain placed in suspect abscess/leak 4/30-output has been bloody but not purulent or enteric.  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  Currently on Unasyn and Cubicin - Cont to monitor ostomy output - increased imodium and fiber - Completed post op abx, but WBC increasing, tachy, lethargic 4/29, with increasing temp-Zosyn  restarted  WBC normalized, see above for current abx regimen.  Per ID - fascial dehiscence without evisceration.  This appears well scarred in for now.  Continue mepitel as able, which is difficult to banjoing of the fascia sutures. Then WD dressing over with with abdominal binder.   -no plans to return to OR, resume regular diet and heparin  gtt.     FEN - regular diet, wean TNA to 1/2 rate, breeze, imodium, fiber VTE - SCDs, heparin  gtt  ID - Completed post op abx. reinititiation of Zosyn  on 4/29, changed to zyvox  5/2, changed to unasyn/Cubicin 5/4 (per ID)   - Per TRH -  Pleural effusions Septic shock - resolved ABL anemia - monitor and transfuse as needed  Coagulopathy - resolved AKI - started CRRT 4/19,  defer to renal.  Holding HD as UOP is good and Cr  improving Acute hypoxemic resp failure - extubated H/o VTE/PE/DVT - scds for now; was on coumadin    LOS: 23 days      Leone Ralphs, [facility] 02/03/2024, 8:24 AM Please see Amion for pager number during day hours 7:00am-4:30pm

## 2024-02-03 NOTE — Progress Notes (Signed)
 [facility]                                                                               Alexander [name], is a 63 y.o. male, DOB - [birth year], ZOX:[id number] Admit date - 01/11/2024    Outpatient Primary MD for the patient is Alexander Iba, [name]  LOS - 23  days    Brief summary   63 year old African-American male with past medical history significant for pulmonary embolism on Coumadin  and HFpEF.  Patient presented to the hospital with BRBPR.  Patient was found to have colon perforation.  GI, general surgery were consulted.  Patient underwent ex lap followed by ICU stay.  Developed AKI requiring CRRT.  Intermittent hemodialysis was planned, however, this was not done as patient's renal function seems to be recovering.  Patient continues to make urine.  Serum creatinine has improved from 5.67-4.94 overnight.  Continue to monitor I's and O's.  Wound culture is growing Enterococcus faecium (VRE that is resistant to ampicillin and vancomycin).  Patient is currently on Zyvox .  Infectious disease team has been consulted.  Infectious disease team has suggested using daptomycin.   Significant event. 4/12 admitted for GI bleed and BRBPR.  GI consulted conservative measures.  EGD colonoscopy was recommended. 4/15 repeat CT scan shows evidence of concern for contained perforation in the ascending colon.  General surgery consulted.  Recommending ex lap. 4/16 transferred to ICU. 4/16 cardiorespiratory arrest, achieved ROSC. 4/17 back to the OR, wound VAC placement and small bowel resection. 4/19 Back to the OR s/p exploratory laparotomy, ileostomy creation, small bowel resection, wound VAC in place. nephrology consulted for CRRT 4/23 extubated; off pressors 4/25 Transferred from [facility] ICU to [facility] progressive due to need for hemodialysis. 4/29 triple-lumen CVC placement for TPN. 4/30 CT-guided drain placement.  Dark bloody output.  Anticoagulation on hold.  Also HD catheter placement on right  with plan for inpatient HD. 5/2.  Diet advanced to clears .  5/5 diet advanced to regular today.  No new complaints.    Assessment & Plan    Assessment and Plan:  Spontaneous perforation of colon SP right colectomy. Underwent 3 ex lap as above, SP right hemicolectomy and small bowel resection as well as ileostomy creation. Has a wound VAC. Currently has IV TPN. Advance diet and wean him off TPN in the next few days.  Management per general surgery. 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 Current plan is conservative management with a drain and a controlled fistula.    AKI  Initially required CRRT. Suspect combination of shock with ATN. Possibility of IV contrast injury also cannot be ruled out. Continues to third space.  Reports of arthralgia. Urine output is actually improving.  Nephrology recommend to hold off on HD. Currently has a temp HD catheter. Creatinine improving.    Acute blood loss anemia as well as anemia of critical illness Iron  deficiency anemia.  B12 deficiency. Presented with BRBPR.  Hemoglobin at baseline around 10.   Hemoglobin stable around 7. Transfuse to keep it greater than 7.  ENTEROCOCCUS FAECIUM infection. Drain culture is growing E faecium.   ID on  board.  Patient currently on Daptomycin. D/c zosyn  and start Unasyn.  Abdominal wound cultures growing VRE (resistant to ampicillin and vancomycin).   Multifactorial shock. Patient has needed vasopressors throughout the hospital stay multiple times. After aggressive IV hydration as well as IV albumin  blood pressure has improved. Monitor.   Septic shock.  Present on admission. Sepsis physiology appears to have resolved for now.   Hypotension. Resolved.    History of PE. 07/2023. Presented with BRBPR.  Anticoagulation has been on hold.  Was resumed with heparin . Another  24 to 48 hours of IV heparin  and then transition to oral agents. Discussed with pharmacy.  Remains at risk for recurrent PE.   Acute hypoxic respiratory failure Requiring intubation. Was extubated on 4/23.  Intermittently on oxygen.  Continue incentive spirometry.   Diverticulitis Treated before.   Hypokalemia Replaced.    Agitation. Likely hospital induced delirium. Monitor.  As needed Ativan .   Hypoglycemia. Resolved.    Acute right knee pain. Concern for gout. Reports pins-and-needles like pain in his right knee. Suspect arthralgia in the setting of need for HD. X-ray unremarkable. Lower extremity venous Doppler ordered. Now appears with swelling and warmth.  No significant redness so far.  Uric acid is normal.  CRP ESR elevated although it could be multifactorial.  Not a candidate for IV steroids, IV Toradol, other oral NSAIDs or colchicine .  Continue pain control for now. Pain controlled.    Hiccups. As needed Thorazine .      Code Status: full code.  DVT Prophylaxis:  SCD's Start: 01/15/24 1400   Level of Care: Level of care: Progressive Family Communication: none at bedside.   Disposition Plan:     Remains inpatient appropriate:  pending clinical improvement.   Procedures:  None    Consultants:   Infectious disease General surgery Nephrology.  IR.   Antimicrobials:   Anti-infectives (From admission, onward)    Start     Dose/Rate Route Frequency Ordered Stop   02/03/24 2000  DAPTOmycin (CUBICIN) 800 mg in sodium chloride  0.9 % IVPB        10 mg/kg  76.5 kg 132 mL/hr over 30 Minutes Intravenous Every 48 hours 02/03/24 0801     02/02/24 1230  Ampicillin-Sulbactam (UNASYN) 3 g in sodium chloride  0.9 % 100 mL IVPB        3 g 200 mL/hr over 30 Minutes Intravenous Every 12 hours 02/02/24 1208     02/01/24 2000  DAPTOmycin (CUBICIN) IVPB 500 mg/50mL premix  Status:  Discontinued        6 mg/kg  76.5 kg 100 mL/hr over 30 Minutes Intravenous Every 48  hours 02/01/24 1853 02/03/24 0801   01/31/24 2200  linezolid  (ZYVOX ) IVPB 600 mg  Status:  Discontinued        600 mg 300 mL/hr over 60 Minutes Intravenous Every 12 hours 01/31/24 2036 02/01/24 1853   01/28/24 1000  piperacillin -tazobactam (ZOSYN ) IVPB 2.25 g  Status:  Discontinued        2.25 g 100 mL/hr over 30 Minutes Intravenous Every 8 hours 01/28/24 0909 02/02/24 1208   01/18/24 1400  piperacillin -tazobactam (ZOSYN ) IVPB 3.375 g  Status:  Discontinued        3.375 g 12.5 mL/hr over 240 Minutes Intravenous Every 6 hours 01/18/24 1241 01/18/24 1242   01/18/24 1400  piperacillin -tazobactam (ZOSYN ) IVPB 3.375 g        3.375 g 100 mL/hr  over 30 Minutes Intravenous Every 6 hours 01/18/24 1242 01/23/24 2046   01/18/24 0000  piperacillin -tazobactam (ZOSYN ) IVPB 2.25 g  Status:  Discontinued        2.25 g 100 mL/hr over 30 Minutes Intravenous Every 6 hours 01/17/24 2120 01/18/24 1241   01/12/24 0200  piperacillin -tazobactam (ZOSYN ) IVPB 3.375 g  Status:  Discontinued        3.375 g 12.5 mL/hr over 240 Minutes Intravenous Every 8 hours 01/11/24 2120 01/17/24 2120   01/11/24 1815  piperacillin -tazobactam (ZOSYN ) IVPB 3.375 g        3.375 g 100 mL/hr over 30 Minutes Intravenous  Once 01/11/24 1806 01/11/24 1921        Medications  Scheduled Meds:  Chlorhexidine  Gluconate Cloth  6 each Topical Daily   diclofenac  Sodium  2 g Topical QID   feeding supplement  237 mL Oral BID BM   ferrous sulfate  325 mg Oral Q breakfast   loperamide  4 mg Oral TID   multivitamin with minerals  1 tablet Oral Daily   mouth rinse  15 mL Mouth Rinse 4 times per day   pantoprazole   40 mg Oral BID   polycarbophil  1,250 mg Oral BID   sodium chloride  flush  5 mL Intracatheter Q8H   sucralfate   1 g Oral BID   Continuous Infusions:  sodium chloride  Stopped (01/24/24 2213)   ampicillin-sulbactam (UNASYN) IV 3 g (02/03/24 1004)   chlorproMAZINE  (THORAZINE ) 12.5 mg in sodium chloride  0.9 % 25 mL IVPB      DAPTOmycin     heparin  2,100 Units/hr (02/03/24 1049)   TPN ADULT (ION) 85 mL/hr at 02/03/24 0800   TPN ADULT (ION)     PRN Meds:.sodium chloride , alum & mag hydroxide-simeth, chlorproMAZINE  (THORAZINE ) 12.5 mg in sodium chloride  0.9 % 25 mL IVPB, HYDROmorphone  (DILAUDID ) injection **OR** HYDROmorphone  (DILAUDID ) injection, ipratropium-albuterol , lip balm, LORazepam , methocarbamol  (ROBAXIN ) injection, ondansetron  (ZOFRAN ) IV, mouth rinse, oxyCODONE     Subjective:   Yochanon Bracknell was seen and examined today.  No new complaints  Objective:   Vitals:   02/02/24 2311 02/03/24 0336 02/03/24 0628 02/03/24 0715  BP: 132/73 136/78  135/76  Pulse: 93 83  84  Resp: 18 14  17   Temp: 99 F (37.2 C) 98.7 F (37.1 C)  98.5 F (36.9 C)  TempSrc: Oral Oral  Oral  SpO2: 99% 99%  100%  Weight:   75.6 kg   Height:        Intake/Output Summary (Last 24 hours) at 02/03/2024 1208 Last data filed at 02/03/2024 0900 Gross per 24 hour  Intake 2217.09 ml  Output 3830 ml  Net -1612.91 ml   Filed Weights   02/01/24 0600 02/02/24 0513 02/03/24 0628  Weight: 76.5 kg 75.5 kg 75.6 kg     Exam General exam: Appears calm and comfortable  Respiratory system: Clear to auscultation. Respiratory effort normal. Cardiovascular system: S1 & S2 heard, RRR. No JVD, Gastrointestinal system: Abdomen is nondistended, soft and nontender.  Central nervous system: Alert and oriented.  Extremities: Symmetric 5 x 5 power. Skin: No rashes,  Psychiatry: Mood & affect appropriate.     Data Reviewed:  I have personally reviewed following labs and imaging studies   CBC Lab Results  Component Value Date   WBC 7.0 02/03/2024   RBC 2.52 (L) 02/03/2024   HGB 7.4 (L) 02/03/2024   HCT 23.7 (L) 02/03/2024   MCV 94.0 02/03/2024   MCH 29.4 02/03/2024   PLT 174 02/03/2024  MCHC 31.2 02/03/2024   RDW 17.4 (H) 02/03/2024   LYMPHSABS 1.3 01/29/2024   MONOABS 1.9 (H) 01/29/2024   EOSABS 0.3 01/29/2024   BASOSABS 0.1  01/29/2024     Last metabolic panel Lab Results  Component Value Date   NA 141 02/03/2024   NA 138 02/03/2024   K 4.2 02/03/2024   K 4.1 02/03/2024   CL 110 02/03/2024   CL 108 02/03/2024   CO2 21 (L) 02/03/2024   CO2 21 (L) 02/03/2024   BUN 54 (H) 02/03/2024   BUN 54 (H) 02/03/2024   CREATININE 3.15 (H) 02/03/2024   CREATININE 3.05 (H) 02/03/2024   GLUCOSE 123 (H) 02/03/2024   GLUCOSE 125 (H) 02/03/2024   GFRNONAA 22 (L) 02/03/2024   GFRNONAA 22 (L) 02/03/2024   GFRAA 111 01/22/2020   CALCIUM  8.1 (L) 02/03/2024   CALCIUM  7.9 (L) 02/03/2024   PHOS 3.2 02/03/2024   PHOS 3.3 02/03/2024   PROT 6.7 02/03/2024   ALBUMIN  <1.5 (L) 02/03/2024   ALBUMIN  <1.5 (L) 02/03/2024   LABGLOB 3.1 01/22/2020   AGRATIO 1.4 01/22/2020   BILITOT 0.7 02/03/2024   ALKPHOS 70 02/03/2024   AST 40 02/03/2024   ALT 53 (H) 02/03/2024   ANIONGAP 10 02/03/2024   ANIONGAP 9 02/03/2024    CBG (last 3)  No results for input(s): "GLUCAP" in the last 72 hours.     Coagulation Profile: Recent Labs  Lab 02/03/24 1005  INR 1.1     Radiology Studies: DG CHEST PORT 1 VIEW Result Date: 02/01/2024 CLINICAL DATA:  Check central line placement EXAM: PORTABLE CHEST 1 VIEW COMPARISON:  01/15/2024 FINDINGS: Cardiac shadow is mildly prominent but accentuated by the portable technique. Right jugular central line is noted in satisfactory position. Additionally a temporary dialysis catheter on the right is noted with the tip in the right atrium. No pneumothorax is seen. The overall inspiratory effort is poor with some basilar scarring particularly on the left. Old rib fractures are noted on the right and stable. IMPRESSION: No pneumothorax following central line placement. Stable left basilar scarring. Electronically Signed   By: Violeta Grey M.D.   On: 02/01/2024 19:36       Feliciana Horn M.D. [facility] 02/03/2024, 12:08 PM  Available via Epic secure chat 7am-7pm After 7 pm, please refer to night  coverage provider listed on amion.

## 2024-02-03 NOTE — Plan of Care (Signed)
  Problem: Clinical Measurements: Goal: Ability to maintain clinical measurements within normal limits will improve Outcome: Progressing Goal: Diagnostic test results will improve Outcome: Progressing Goal: Respiratory complications will improve Outcome: Progressing Goal: Cardiovascular complication will be avoided Outcome: Progressing   Problem: Coping: Goal: Level of anxiety will decrease Outcome: Progressing   Problem: Safety: Goal: Ability to remain free from injury will improve Outcome: Progressing

## 2024-02-03 NOTE — Progress Notes (Signed)
 Referring Provider(s): Marlin Simmonds, PA-C  Supervising Physician: Alyssa Jumper  Patient Status:  [facility] - In-pt  Chief Complaint: Intra-abdominal fluid collection seen for drain follow-up  Brief History:  63 year old male with history of PE/DVT on coumadin  s/p thrombolysis on 07/12/23 with Dr. Baldemar Lev who was admitted to [facility] on 01/11/24 for GI bleeding. He underwent and ex lap and was found to have a perforated ascending colon with intra abdominal abscess. Initially patient had a right colectomy without anastomosis, placement of wound VAC, and drainage of intra-abdominal abscesses. He returned to the OR a few days later for ileostomy creation and small bowel resection. He was ultimately transferred to [facility] for hemodialysis. IR initially consulted on 4/25 for possible drain, but it was recommended to continue monitoring with possible re-imaging if no improvement. Patient later developed persistent leukocytosis with lethargy and confusion prompting the start of antibiotics and CV and HD line removals. Repeat imaging on 4/29 revealed persistent intra-abdominal fluid. IR again consulted for CV line and drain placement. Patient was agreeable to CV line, but refused drain placement at that time. After discussion with family, patient became agreeable to drain placement and underwent Right midline drain and HD line placement with Dr. Harrison Lin on 4/30.   Subjective:  Patient sitting upright in bedside chair. Denies any worsening abdominal pain, but admits to discomfort to his RUQ with movement or pressure. Otherwise without complaints.  Allergies: Patient has no known allergies.  Medications: Prior to Admission medications   Medication Sig Start Date End Date Taking? Authorizing Provider  acetaminophen  (TYLENOL ) 325 MG tablet Take 2 tablets (650 mg total) by mouth every 6 (six) hours as needed for mild pain (pain score 1-3) (or Fever >/= 101). 07/23/23  Yes Sheikh, Omair Latif, DO  warfarin  (COUMADIN ) 5 MG tablet TAKE 2 TABLETS BY MOUTH DAILY OR AS DIRECTED BY ANTICOAGULATION CLINIC Patient taking differently: Take 10 mg by mouth daily at 4 PM. 12/24/23  Yes Alexander Iba, PA  mupirocin  ointment (BACTROBAN ) 2 % Apply to affected area 1-2 times daily Patient not taking: Reported on 01/11/2024 12/03/23   Worley, Samantha, PA  polyethylene glycol powder (GLYCOLAX /MIRALAX ) 17 GM/SCOOP powder Mix as directed and take 17 g by mouth daily. Patient not taking: Reported on 01/11/2024 07/23/23   Aura Leeds Latif, DO     Vital Signs: BP 135/76 (BP Location: Left Arm)   Pulse 84   Temp 98.5 F (36.9 C) (Oral)   Resp 17   Ht 5\' 9"  (1.753 m)   Wt 166 lb 10.7 oz (75.6 kg)   SpO2 100%   BMI 24.61 kg/m   Physical Exam Vitals reviewed.  Constitutional:      Appearance: Normal appearance.  HENT:     Head: Normocephalic and atraumatic.  Cardiovascular:     Rate and Rhythm: Normal rate.  Pulmonary:     Effort: Pulmonary effort is normal.  Abdominal:     General: Abdomen is flat.     Palpations: Abdomen is soft.     Tenderness: There is abdominal tenderness (appropriately tender to RUQ).     Comments: Right midline drain in place. Overlying bandage is clean and dry. Drain is appropriately charged w/ ~20cc bloody output. Flushes easily. Surgical drain in place  Musculoskeletal:     Cervical back: Normal range of motion.  Skin:    General: Skin is warm and dry.  Neurological:     Mental Status: He is alert.  Psychiatric:  Judgment: Judgment normal.     Drain Location: R of midline surgical wound Size: Fr size: 10 Fr Date of placement: 01/29/24  Currently to: Drain collection device: suction bulb 24 hour output:  Output by Drain (mL) 02/01/24 0701 - 02/01/24 1900 02/01/24 1901 - 02/02/24 0700 02/02/24 0701 - 02/02/24 1900 02/02/24 1901 - 02/03/24 0700 02/03/24 0701 - 02/03/24 1236  Closed System Drain LLQ Bulb (JP) 19 Fr.   20 10   Closed System Drain 2 Midline  Abdomen Bulb (JP) 10 Fr.  45 40 5 0    Interval imaging/drain manipulation:  None  Current examination: Flushes easily.  Insertion site unremarkable. Suture and stat lock in place. Dressed appropriately.    Labs:  CBC: Recent Labs    01/31/24 0706 02/01/24 0500 02/02/24 0520 02/03/24 0340  WBC 10.2 10.0 8.4 7.0  HGB 7.3* 7.3* 7.7* 7.4*  HCT 22.4* 23.0* 24.2* 23.7*  PLT 294 226 193 174    COAGS: Recent Labs    01/20/24 0427 01/21/24 0504 01/22/24 0417 01/23/24 0505 01/24/24 0552 01/26/24 0610 01/27/24 0045  INR 1.0 1.0  --   --  1.1 1.2 1.2  APTT 33 34  34 33 46*  --   --   --     BMP: Recent Labs    01/31/24 0706 02/01/24 0500 02/02/24 0520 02/03/24 0340  NA 148* 144 141 141  138  K 3.2* 3.1* 3.4* 4.2  4.1  CL 111 108 109 110  108  CO2 25 23 21* 21*  21*  GLUCOSE 157* 134* 108* 123*  125*  BUN 86* 77* 64* 54*  54*  CALCIUM  7.5* 7.5* 8.0* 8.1*  7.9*  CREATININE 5.67* 4.94* 3.96* 3.15*  3.05*  GFRNONAA 11* 13* 16* 22*  22*    LIVER FUNCTION TESTS: Recent Labs    01/24/24 0552 01/25/24 0540 01/28/24 1653 01/30/24 0445 01/31/24 0706 02/01/24 0500 02/02/24 0520 02/03/24 0340  BILITOT 1.8*  --  1.5* 1.4*  --   --   --  0.7  AST 41  --  33 39  --   --   --  40  ALT 118*  --  49* 44  --   --   --  53*  ALKPHOS 63  --  65 56  --   --   --  70  PROT 5.4*  --  5.8* 5.9*  --   --   --  6.7  ALBUMIN  1.5*  1.6*   < > <1.5* <1.5* <1.5* <1.5* <1.5* <1.5*  <1.5*   < > = values in this interval not displayed.    Assessment and Plan:  Intra-abdominal abscesses: Alexander [name] is a 63 y.o. male with a history of GI bleeding and intra-abdominal abscesses who presented to [facility] Interventional Radiology department for an image-guided right midline drain placement on 4/30 with Dr. Harrison Lin and CV and HD line placements.  Continue TID flushes with 5 cc NS. Record output Q shift. Dressing changes QD or PRN if soiled.  Call IR APP or on call  IR MD if difficulty flushing or sudden change in drain output.  Repeat imaging/possible drain injection once output < 10 mL/QD (excluding flush material). Consideration for drain removal if output is < 10 mL/QD (excluding flush material), pending discussion with the providing surgical service.  Discharge planning: Please contact IR APP or on call IR MD prior to patient d/c to ensure appropriate follow up plans are in place. Typically patient  will follow up with IR clinic 10-14 days post d/c for repeat imaging/possible drain injection. IR scheduler will contact patient with date/time of appointment. Patient will need to flush drain QD with 5 cc NS, record output QD, dressing changes every 2-3 days or earlier if soiled.   IR will continue to follow - please call with questions or concerns.  Thank you for allowing our service to participate in Alexander [name] 's care.   Electronically Signed: Seneca Gadbois M Tee Richeson, PA-C 02/03/2024, 11:41 AM    I spent a total of 15 Minutes at the the patient's bedside AND on the patient's hospital floor or unit, greater than 50% of which was counseling/coordinating care for

## 2024-02-03 NOTE — Consult Note (Signed)
 WOC following patient for ostomy teaching with patient, now consulted to place NPWT surgical wound. Will place NPWT dressing per orders 5/6; notified CCS and nursing staff.   Dominiq [facility], CNS, [employer] [phone]

## 2024-02-03 NOTE — Progress Notes (Signed)
 Pharmacy Antibiotic Note  Alexander [name] is a 63 y.o. male admitted on 01/11/2024 with Abdominal infection growing VRE .  Pharmacy has been consulted  for daptomycin dosing. Patient had an AKI requiring CRRT/HD that is now improving. Scr continues to trend down to 3.15 with good UOP.   Plan: Adjust Daptomycin to 10 mg/kg - 800 mg q 48 hours for VRE  CK today 22 Pharmacy will sign-off consult but continue to monitor CK and renal function peripherally    Height: 5\' 9"  (175.3 cm) Weight: 75.6 kg (166 lb 10.7 oz) IBW/kg (Calculated) : 70.7  Temp (24hrs), Avg:98.8 F (37.1 C), Min:98.5 F (36.9 C), Max:99.2 F (37.3 C)  Recent Labs  Lab 01/28/24 1653 01/28/24 1745 01/30/24 0445 01/30/24 1000 01/31/24 0459 01/31/24 0706 02/01/24 0500 02/02/24 0520 02/03/24 0340  WBC  --    < > 12.6*   < > PATIENT IDENTIFICATION ERROR. PLEASE DISREGARD RESULTS. ACCOUNT WILL BE CREDITED. 10.2 10.0 8.4 7.0  CREATININE 5.79*   < > 5.91*  --   --  5.67* 4.94* 3.96* 3.15*  3.05*  LATICACIDVEN 1.6  --   --   --   --   --   --   --   --    < > = values in this interval not displayed.    Estimated Creatinine Clearance: 24.6 mL/min (A) (by C-G formula based on SCr of 3.15 mg/dL (H)).    No Known Allergies   Thank you for allowing pharmacy to be a part of this patient's care.  Denson Flake, PharmD, BCPS, BCIDP Infectious Diseases Clinical Pharmacist Phone: [phone] 02/03/2024 8:01 AM

## 2024-02-03 NOTE — TOC Progression Note (Signed)
 Transition of Care [facility]) - Progression Note    Patient Details  Name: Life Simic MRN: [id number] Date of Birth: 07/15/1961  Transition of Care [facility]) CM/SW Contact  Jannine Meo, RN Phone Number: 02/03/2024, 12:14 PM  Clinical Narrative:   Per progression rounds, patient still unstable for discharge.     Expected Discharge Plan: Long Term Acute Care (LTAC) Barriers to Discharge: Continued Medical Work up  Expected Discharge Plan and Services   Discharge Planning Services: CM Consult   Living arrangements for the past 2 months: Single Family Home                                       Social Determinants of Health (SDOH) Interventions SDOH Screenings   Food Insecurity: No Food Insecurity (01/12/2024)  Housing: Low Risk  (01/12/2024)  Transportation Needs: No Transportation Needs (01/12/2024)  Utilities: Not At Risk (01/12/2024)  Depression (PHQ2-9): Low Risk  (07/24/2023)  Tobacco Use: Medium Risk (01/15/2024)    Readmission Risk Interventions    01/24/2024   10:43 AM 01/13/2024    9:13 AM 07/11/2023    2:11 PM  Readmission Risk Prevention Plan  Post Dischage Appt   Complete  Medication Screening   Complete  Transportation Screening Complete Complete Complete  PCP or Specialist Appt within 3-5 Days Complete Complete   HRI or Home Care Consult Complete Complete   Social Work Consult for Recovery Care Planning/Counseling Complete Complete   Palliative Care Screening Not Applicable Not Applicable   Medication Review Oceanographer) Complete Complete
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
## 2024-02-03 NOTE — Plan of Care (Signed)
  Problem: Education: Goal: Knowledge of General Education information will improve Description: Including pain rating scale, medication(s)/side effects and non-pharmacologic comfort measures Outcome: Progressing   Problem: Health Behavior/Discharge Planning: Goal: Ability to manage health-related needs will improve Outcome: Progressing   Problem: Clinical Measurements: Goal: Ability to maintain clinical measurements within normal limits will improve Outcome: Progressing Goal: Will remain free from infection Outcome: Progressing Goal: Diagnostic test results will improve Outcome: Progressing Goal: Respiratory complications will improve Outcome: Progressing Goal: Cardiovascular complication will be avoided Outcome: Progressing   Problem: Nutrition: Goal: Adequate nutrition will be maintained Outcome: Progressing   Problem: Coping: Goal: Level of anxiety will decrease Outcome: Progressing   Problem: Elimination: Goal: Will not experience complications related to bowel motility Outcome: Progressing Goal: Will not experience complications related to urinary retention Outcome: Progressing   Problem: Pain Managment: Goal: General experience of comfort will improve and/or be controlled Outcome: Progressing   Problem: Safety: Goal: Ability to remain free from injury will improve Outcome: Progressing   Problem: Skin Integrity: Goal: Risk for impaired skin integrity will decrease Outcome: Progressing   Problem: Activity: Goal: Ability to tolerate increased activity will improve Outcome: Progressing   Problem: Respiratory: Goal: Ability to maintain a clear airway and adequate ventilation will improve Outcome: Progressing   Problem: Role Relationship: Goal: Method of communication will improve Outcome: Progressing   Problem: Education: Goal: Ability to describe self-care measures that may prevent or decrease complications (Diabetes Survival Skills Education) will  improve Outcome: Progressing Goal: Individualized Educational Video(s) Outcome: Progressing   Problem: Coping: Goal: Ability to adjust to condition or change in health will improve Outcome: Progressing   Problem: Fluid Volume: Goal: Ability to maintain a balanced intake and output will improve Outcome: Progressing   Problem: Health Behavior/Discharge Planning: Goal: Ability to identify and utilize available resources and services will improve Outcome: Progressing Goal: Ability to manage health-related needs will improve Outcome: Progressing   Problem: Metabolic: Goal: Ability to maintain appropriate glucose levels will improve Outcome: Progressing   Problem: Nutritional: Goal: Maintenance of adequate nutrition will improve Outcome: Progressing Goal: Progress toward achieving an optimal weight will improve Outcome: Progressing   Problem: Skin Integrity: Goal: Risk for impaired skin integrity will decrease Outcome: Progressing   Problem: Tissue Perfusion: Goal: Adequacy of tissue perfusion will improve Outcome: Progressing

## 2024-02-03 NOTE — Progress Notes (Addendum)
 PHARMACY - TOTAL PARENTERAL NUTRITION CONSULT NOTE   Indication: colon perforation,  intolerance to enteral nutrition  Patient Measurements: Height: 5\' 9"  (175.3 cm) Weight: 75.6 kg (166 lb 10.7 oz) IBW/kg (Calculated) : 70.7 TPN AdjBW (KG): 78.1 Body mass index is 24.61 kg/m.  Assessment: 62yoM admitted 4/12 for rectal bleeding and weakness s/p multiple GI surgeries with findings of colonic perforation requiring resection of R colon, closure on 4/19. Pt with high OG output post-operatively with no meal intake since admission. Prior to admission, patient reports intake of "normal food". Pharmacy to start TPN for prolonged duration without enteral nutrition and prolonged ileus.   TPN discontinued 4/28 with full liquid diet and displaced central line, however resumed 4/30 with new line due to need for bowel rest and findings on 4/29 CT indicating patient is high risk for return to OR for EC fistula or additional perforations. Patient refused replacement of NGT.  5/4 patient kidney function is recovering with good urine and high ostomy output. He states that he is drinking a good amount of fluid daily. No LE edema. Will unconcentrate TPN. 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  Glucose / Insulin : no hx DM, CBG < 140, off SSI Electrolytes: K 4.2 (received 40 mEq IV), CO2 21, CoCa 10, others wnl  Renal: SCr 3.15 down (BL 1) , BUN 54 CRRT 4/19 >> 4/24. Last HD 4/27 PM with 1L removed.  Hepatic: Alk phos/AST/T bili, wnl, ALT 53, albumin  <1.5, TG 48 Intake / Output; MIVF: UOP 1.9 ml/kg/hr, drain 75 mL, stool 1720 mL on loperamide TID + fibercon  GI Imaging: 4/15 CT A/P: wall thickening of R colon with inflammation and suspected enlarging contained perforation 2/2 diverticulitis, colitis or neoplasm. No organized fluid collection or pneumoperitoneum. Interval increased gastric distension consistent with an ileus. 4/16 KUB: gas and distended loops of  colon 4/23 KUB: decreased prominence of SB loops 4/25 CT A/P: 5.9 cm fluid collection inferior to the gallbladder, possibly hepatic abscess 4/29 CT: extravasated oral contrast vs hematoma, large abdominal/pelvic fluid collection concerning for gas and possible fistula vs SB perforation; colonic diverticulosis; gaseous distension of SB indicate possible ileus, anasarca  GI Surgeries / Procedures:  4/16 Right colectomy, drainage of abscess 4/17 Re-exploration abdomen, small bowel resection 4/19 Re-exploration abdomen, small bowel resection, creation of end ileostomy, abd closure and placement of wound vac 4/28 NGT removed  4/30 Abd drain placed -Dark bloody output.   Central access: CVC 4/29, temp HD cath placed 4/30  TPN start date: 4/21-4/28; 4/30 >>  Nutritional Goals: Concentrated: Goal rate of 65 mL/hr will provide 106 g protein and 2040 kcal Not concentrated: Goal rate of 85 ml/hr will provide 106 g protein and 2097 kcal  RD Assessment:  Estimated Needs Total Energy Estimated Needs: 2000-2250 kcals Total Protein Estimated Needs: 100-115 grams Total Fluid Estimated Needs: >/= 2L  Current Nutrition:  NPO and TPN 5/2 CLD 5/3 FLD - ate 3-4 spoonfuls of grits, jello, pudding 5/4 FLD - ate over half a bowl of grits, most of a chocolate pudding, jello, 1 Ensure, broth, ice pops, 1 magic cup  5/5 reg diet  Plan: Halve TPN per Surgery to 35mL/hr at 1800, provides 56g AA and 1109 kcal, meeting ~50% of estimated needs Electrolytes in TPN: Na 100 mEq/L, slight decrease K 80 mEq/L, slight decrease Ca 3 mEq/L, slight decrease Mg 10 mEq/L, slight decrease phos 20 mEq/L. [id number] to max acet Remove standard MVI and trace elements to TPN, give  PO  Monitor TPN labs on Mon/Thurs, PRN   F/u PO intake, wean TPN as able   Thank you for involving pharmacy in this patient's care.  Dorene Gang, PharmD, BCPS, BCCP Clinical Pharmacist  Please check AMION for all [facility] Pharmacy phone numbers After  10:00 PM, call Main Pharmacy [phone]

## 2024-02-03 NOTE — Plan of Care (Signed)
  Problem: Education: Goal: Knowledge of General Education information will improve Description: Including pain rating scale, medication(s)/side effects and non-pharmacologic comfort measures Outcome: Not Progressing   Problem: Health Behavior/Discharge Planning: Goal: Ability to manage health-related needs will improve Outcome: Not Progressing   Problem: Clinical Measurements: Goal: Ability to maintain clinical measurements within normal limits will improve Outcome: Not Progressing Goal: Will remain free from infection Outcome: Not Progressing Goal: Diagnostic test results will improve Outcome: Not Progressing Goal: Respiratory complications will improve Outcome: Not Progressing Goal: Cardiovascular complication will be avoided Outcome: Not Progressing   Problem: Nutrition: Goal: Adequate nutrition will be maintained Outcome: Not Progressing   Problem: Coping: Goal: Level of anxiety will decrease Outcome: Not Progressing   Problem: Elimination: Goal: Will not experience complications related to bowel motility Outcome: Not Progressing Goal: Will not experience complications related to urinary retention Outcome: Not Progressing   Problem: Pain Managment: Goal: General experience of comfort will improve and/or be controlled Outcome: Not Progressing   Problem: Safety: Goal: Ability to remain free from injury will improve Outcome: Not Progressing   Problem: Skin Integrity: Goal: Risk for impaired skin integrity will decrease Outcome: Not Progressing   Problem: Activity: Goal: Ability to tolerate increased activity will improve Outcome: Not Progressing   Problem: Respiratory: Goal: Ability to maintain a clear airway and adequate ventilation will improve Outcome: Not Progressing   Problem: Role Relationship: Goal: Method of communication will improve Outcome: Not Progressing   Problem: Education: Goal: Ability to describe self-care measures that may prevent or  decrease complications (Diabetes Survival Skills Education) will improve Outcome: Not Progressing Goal: Individualized Educational Video(s) Outcome: Not Progressing   Problem: Coping: Goal: Ability to adjust to condition or change in health will improve Outcome: Not Progressing   Problem: Fluid Volume: Goal: Ability to maintain a balanced intake and output will improve Outcome: Not Progressing   Problem: Health Behavior/Discharge Planning: Goal: Ability to identify and utilize available resources and services will improve Outcome: Not Progressing Goal: Ability to manage health-related needs will improve Outcome: Not Progressing   Problem: Metabolic: Goal: Ability to maintain appropriate glucose levels will improve Outcome: Not Progressing   Problem: Nutritional: Goal: Maintenance of adequate nutrition will improve Outcome: Not Progressing Goal: Progress toward achieving an optimal weight will improve Outcome: Not Progressing   Problem: Skin Integrity: Goal: Risk for impaired skin integrity will decrease Outcome: Not Progressing   Problem: Tissue Perfusion: Goal: Adequacy of tissue perfusion will improve Outcome: Not Progressing

## 2024-02-03 NOTE — Progress Notes (Addendum)
 Nutrition Follow-up  DOCUMENTATION CODES:   Not applicable  INTERVENTION:  Continue TPN and wean as able:             - TPN management per pharmacy.  TPN provides: 1109 kcals (55% of minumum estimated needs), 56g protein (56% of minimum estimated needs)   Daily weights while on TPN Continue Fibercon r/t high ostomy output Continue Ensure Enlive po BID, each supplement provides 350 kcal and 20 grams of protein.  Continue MVI w/ minerals Monitor diet advancement and ostomy output   NUTRITION DIAGNOSIS:  Increased nutrient needs related to wound healing (open abdomen) as evidenced by estimated needs. - new dx established as diet advanced  GOAL:  Patient will meet greater than or equal to 90% of their needs - progressing  MONITOR:  PO intake, Supplement acceptance, Weight trends, Labs, Skin  REASON FOR ASSESSMENT:  Consult New TPN/TNA  ASSESSMENT:   63 y.o. male who has a PMH including HTN, PAD, diverticulitis who was admitted with rectal bleeding and hypotension.  4/12 admitted 4/13 CLD 4/15 CT a/p showing suspicion for contained colon perforation and ileus 4/16 OR for ex-lap, right colectomy, wound vac placement; returned to ICU intubated 4/17 OR for re-exploration of abdomen, wound vac placement, small bowel resection, left in discontinuity  4/19 s/p ex lap, ileostomy creation, SB resection, Wound VAC; CRRT initiated 4/21 TPN Initiated  4/23 Extubated, NGT placed for suction 4/24 Stopping CRRT 4/25 transferred to [facility] HD txs 4/27 clear liquid diet 4/28 NGT removed, full liquid diet, TPN stopped 4/29 NPO, replacement RIJ CVL, withdrew consent for drain placement  4/30 drain placed 5/2 advanced to clear liquid diet, thin liquids 5/3 advanced to full liquid diet, thin liquids 5/5 advanced to regular diet, thin liquids 5/5 TPN halved  Surgery concerned for fascial dehiscence, however no plans for OR. Surgery monitoring.  Surgery also reducing TPN to half given  advancement of diet. Still large ostomy output, however not significantly increased compared to last week when patient was NPO and receiving TPN.   Average Meal Intake 5/2: 25% x1 documented meal 5/3: 20% x1 documented meal  Pt with breakfast tray at bedside and consumed approximately 85-90% of his meal. He endorses vigorous appetite and tolerance, however this is only his first regular texture meal. Will continue to monitor. No recently endorsed N/V. Still with large volume of ostomy output. If continues to increase, would suggest discontinuing Ensure Enlive.   Admit Weight: 80.7kg Current Weight: 75.6kg Lowest Weight: 75.5kg on 5/4   Initially had quite a large weight increased likely 2/2 amount of blood products/fluids received. Has stabilized around admission weight, however moderate pitting edema documented to BLEs and mild edema to BUEs. Likely has more weight to lose that may be masking additional muscle and fat wasting. UOP improving. Crt trending down. Expected to be able to pull dialysis catheter. Last dialysis 4/27.  Intake/Output Summary (Last 24 hours) at 02/03/2024 1206 Last data filed at 02/03/2024 0900 Gross per 24 hour  Intake 2217.09 ml  Output 3830 ml  Net -1612.91 ml    Net IO Since Admission: -131,118.62 mL [02/03/24 1206]    Drain/Lines: RIJ (CVC triple lumen) placed 4/30 RUQ: Ileostomy: 1.7L x24 hours - increased LLQ: JP drain (19Fr): 30ml x24 hours - reduced Midline: JP drain (10Fr): 45ml x24 hours Foley catheter UOP: 3.5L x24 hours   TPN modified to provide slightly less concentration sodium, potassium, calcium , magnesium , and phosphorus. Albumin  significantly low. This is reflective of the inflammatory processes  at play as well as acute stress response and some fluid overload. WBCs have normalized, as has magnesium  and phosphorus.   Meds: ferrous sulfate, loperamide, MVI, sucralfate , pantoprazole , IV ABX   Labs from 4/28 reviewed:  Na+ 141 (wdl) K+ 4.1>4.2  (wdl) Crt 3.96>3.15 (H) Alb <1.5 (L) CBGs 108-125 x24 hours A1c 5.9 (11/2023)  Diet Order:   Diet Order             Diet regular Room service appropriate? Yes; Fluid consistency: Thin  Diet effective now            EDUCATION NEEDS:  Education needs have been addressed  Skin:  Skin Assessment: Skin Integrity Issues: Skin Integrity Issues:: Incisions, Other (Comment) Incisions: abdomen Other: Several skin tears  Last BM:  5/5  Height:  Ht Readings from Last 1 Encounters:  01/24/24 5\' 9"  (1.753 m)   Weight:  Wt Readings from Last 1 Encounters:  02/03/24 75.6 kg   Ideal Body Weight:  72.73 kg  BMI:  Body mass index is 24.61 kg/m.  Estimated Nutritional Needs:   Kcal:  2000-2250 kcals  Protein:  100-115 grams  Fluid:  >/= 2L  Con Decant MS, RD, LDN Registered Dietitian Clinical Nutrition RD Inpatient Contact Info in Amion

## 2024-02-04 DIAGNOSIS — I10 Essential (primary) hypertension: Secondary | ICD-10-CM | POA: Diagnosis not present

## 2024-02-04 DIAGNOSIS — N179 Acute kidney failure, unspecified: Secondary | ICD-10-CM | POA: Diagnosis not present

## 2024-02-04 DIAGNOSIS — E861 Hypovolemia: Secondary | ICD-10-CM | POA: Diagnosis not present

## 2024-02-04 DIAGNOSIS — D509 Iron deficiency anemia, unspecified: Secondary | ICD-10-CM | POA: Diagnosis not present

## 2024-02-04 DIAGNOSIS — K631 Perforation of intestine (nontraumatic): Secondary | ICD-10-CM | POA: Diagnosis not present

## 2024-02-04 LAB — RENAL FUNCTION PANEL
Albumin: <1.5 g/dL — ABNORMAL LOW (ref 3.5–5.0)
Albumin: <1.5 g/dL — ABNORMAL LOW (ref 3.5–5.0)
Anion gap: 7 (ref 5–15)
Anion gap: 8 (ref 5–15)
BUN: 44 mg/dL — ABNORMAL HIGH (ref 8–23)
BUN: 43 mg/dL — ABNORMAL HIGH (ref 8–23)
CO2: 23 mmol/L (ref 22–32)
CO2: 24 mmol/L (ref 22–32)
Calcium: 8.1 mg/dL — ABNORMAL LOW (ref 8.9–10.3)
Calcium: 8.2 mg/dL — ABNORMAL LOW (ref 8.9–10.3)
Chloride: 110 mmol/L (ref 98–111)
Chloride: 109 mmol/L (ref 98–111)
Creatinine, Ser: 2.41 mg/dL — ABNORMAL HIGH (ref 0.61–1.24)
Creatinine, Ser: 2.4 mg/dL — ABNORMAL HIGH (ref 0.61–1.24)
GFR, Estimated: 30 mL/min — ABNORMAL LOW (ref >60)
GFR, Estimated: 30 mL/min — ABNORMAL LOW (ref >60)
Glucose, Bld: 109 mg/dL — ABNORMAL HIGH (ref 70–99)
Glucose, Bld: 116 mg/dL — ABNORMAL HIGH (ref 70–99)
Phosphorus: 4.1 mg/dL (ref 2.5–4.6)
Phosphorus: 4.5 mg/dL (ref 2.5–4.6)
Potassium: 4.4 mmol/L (ref 3.5–5.1)
Potassium: 4.4 mmol/L (ref 3.5–5.1)
Sodium: 140 mmol/L (ref 135–145)
Sodium: 141 mmol/L (ref 135–145)

## 2024-02-04 LAB — CBC
HCT: 23.9 % — ABNORMAL LOW (ref 39.0–52.0)
Hemoglobin: 7.5 g/dL — ABNORMAL LOW (ref 13.0–17.0)
MCH: 29.5 pg (ref 26.0–34.0)
MCHC: 31.4 g/dL (ref 30.0–36.0)
MCV: 94.1 fL (ref 80.0–100.0)
Platelets: 167 10*3/uL (ref 150–400)
RBC: 2.54 MIL/uL — ABNORMAL LOW (ref 4.22–5.81)
RDW: 17.4 % — ABNORMAL HIGH (ref 11.5–15.5)
WBC: 7.4 10*3/uL (ref 4.0–10.5)
nRBC: 0 % (ref 0.0–0.2)

## 2024-02-04 LAB — MAGNESIUM: Magnesium: 1.8 mg/dL (ref 1.7–2.4)

## 2024-02-04 LAB — HEPARIN LEVEL (UNFRACTIONATED): Heparin Unfractionated: 0.47 [IU]/mL (ref 0.30–0.70)

## 2024-02-04 LAB — PROTIME-INR
INR: 1.2 (ref 0.8–1.2)
Prothrombin Time: 15.4 s — ABNORMAL HIGH (ref 11.4–15.2)

## 2024-02-04 MED ORDER — MAGNESIUM SULFATE 2 GM/50ML IV SOLN
2.0000 g | Freq: Once | INTRAVENOUS | Status: AC
  Administered 2024-02-04: 2 g via INTRAVENOUS
  Filled 2024-02-04: qty 50

## 2024-02-04 MED ORDER — WARFARIN - PHARMACIST DOSING INPATIENT: Freq: Every day | Status: DC

## 2024-02-04 MED ORDER — WARFARIN SODIUM 5 MG PO TABS
10.0000 mg | ORAL_TABLET | Freq: Once | ORAL | Status: AC
  Administered 2024-02-04: 10 mg via ORAL
  Filled 2024-02-04: qty 2

## 2024-02-04 NOTE — Progress Notes (Signed)
 Occupational Therapy Treatment Patient Details Name: Alexander [name] MRN: [id number] DOB: 1960-12-05 Today's Date: 02/04/2024   History of present illness Alexander [name] is a 63 y.o. male admitted to [name] on 01/11/2024 with rectal bleeding and hypotension, now S/P exploratory laparotomy where he was found to have a perforated ascending colon with intra-abdominal abscess, right colectomy without anastomosis, placement of wound VAC, and drainage of intra-abdominal abscesses. CRRT started 4/19- 01/23/24 .   PMH: PE/DVT on Coumadin , GERD, R ankle arthroplasty   OT comments  Patient demonstrating good progress with OT treatment compared to last treatment session with patient meeting goals for grooming and self feeding. Patient progressing with bed mobility and self care tasks. Patient appeared eager to continue to make progress. Acute OT to continue to follow to address established goals to facilitate DC to next venue of care.       If plan is discharge home, recommend the following:  Two people to help with walking and/or transfers;Two people to help with bathing/dressing/bathroom;Direct supervision/assist for medications management;Assistance with feeding;Help with stairs or ramp for entrance   Equipment Recommendations  Other (comment) (to be deterined pending functional progress)    Recommendations for Other Services      Precautions / Restrictions Precautions Precautions: Fall;Other (comment) Recall of Precautions/Restrictions: Impaired Precaution/Restrictions Comments: abdominal incision dehisced Required Braces or Orthoses:  (abdominal binder) Restrictions Weight Bearing Restrictions Per Provider Order: No       Mobility Bed Mobility Overal bed mobility: Needs Assistance Bed Mobility: Rolling, Sidelying to Sit Rolling: Mod assist Sidelying to sit: Min assist       General bed mobility comments: cues throughtout for log rolling technique and assistance with trunk and scooting to  EOB    Transfers Overall transfer level: Needs assistance Equipment used: Ambulation equipment used Transfers: Sit to/from Stand, Bed to chair/wheelchair/BSC Sit to Stand: Mod assist           General transfer comment: able to stand from EOB into [place] with mod assist and min assist from [employer] via Lift Equipment: Stedy   Balance Overall balance assessment: Needs assistance Sitting-balance support: Feet supported, Bilateral upper extremity supported Sitting balance-Leahy Scale: Fair Sitting balance - Comments: supervision to CGA while seated on EOB with BUE support   Standing balance support: Bilateral upper extremity supported, Reliant on assistive device for balance Standing balance-Leahy Scale: Zero Standing balance comment: relies on UEs in stedy                           ADL either performed or assessed with clinical judgement   ADL Overall ADL's : Needs assistance/impaired Eating/Feeding: Set up;Sitting Eating/Feeding Details (indicate cue type and reason): in recliner Grooming: Wash/dry hands;Wash/dry face;Set up;Sitting Grooming Details (indicate cue type and reason): in recliner             Lower Body Dressing: Total assistance;Bed level Lower Body Dressing Details (indicate cue type and reason): to donn socks               General ADL Comments: able to feed self without difficulty whle seated up in recliner    Extremity/Trunk Assessment Upper Extremity Assessment Upper Extremity Assessment: Generalized weakness            Vision       Perception     Praxis     Communication Communication Communication: Impaired Factors Affecting Communication: Reduced clarity of speech   Cognition Arousal: Alert Behavior During Therapy:  WFL for tasks assessed/performed               OT - Cognition Comments: improvement in following commands from last OT session                 Following commands:  Impaired Following commands impaired: Follows one step commands with increased time      Cueing   Cueing Techniques: Verbal cues, Tactile cues, Visual cues  Exercises      Shoulder Instructions       General Comments VSS on RA    Pertinent Vitals/ Pain       Pain Assessment Pain Assessment: Faces Faces Pain Scale: Hurts little more Pain Location: abdomin Pain Descriptors / Indicators: Grimacing, Discomfort Pain Intervention(s): Monitored during session, Repositioned  Home Living                                          Prior Functioning/Environment              Frequency  Min 2X/week        Progress Toward Goals  OT Goals(current goals can now be found in the care plan section)  Progress towards OT goals: Progressing toward goals  Acute Rehab OT Goals Patient Stated Goal: get better OT Goal Formulation: With patient Time For Goal Achievement: 02/06/24 Potential to Achieve Goals: Fair ADL Goals Pt Will Perform Eating: with min assist;with adaptive utensils;bed level Pt Will Perform Grooming: with min assist;bed level Pt Will Perform Upper Body Dressing: with min assist;bed level Additional ADL Goal #1: The pt will perform bed mobility with min assist, in prep for progressive ADL participation.  Plan      Co-evaluation                 AM-PAC OT "6 Clicks" Daily Activity     Outcome Measure   Help from another person eating meals?: A Little Help from another person taking care of personal grooming?: A Little Help from another person toileting, which includes using toliet, bedpan, or urinal?: Total Help from another person bathing (including washing, rinsing, drying)?: A Lot Help from another person to put on and taking off regular upper body clothing?: Total Help from another person to put on and taking off regular lower body clothing?: Total 6 Click Score: 11    End of Session Equipment Utilized During Treatment:  Oxygen;Other (comment) Octaviano Belts)  OT Visit Diagnosis: Muscle weakness (generalized) (M62.81);Feeding difficulties (R63.3);Pain Pain - part of body:  (abdomin)   Activity Tolerance Patient tolerated treatment well   Patient Left in chair;with call bell/phone within reach;with chair alarm set   Nurse Communication Mobility status;Need for lift equipment        Time: 1610-9604 OT Time Calculation (min): 28 min  Charges: OT General Charges $OT Visit: 1 Visit OT Treatments $Self Care/Home Management : 23-37 mins  Anitra Barn, OTA Acute Rehabilitation Services  Office [phone]   Jovita Nipper 02/04/2024, 2:30 PM

## 2024-02-04 NOTE — Progress Notes (Signed)
 [facility]  Date of Admission:  01/11/2024    Principal Problem:   Spontaneous perforation of colon (HCC) Active Problems:   History of pulmonary embolus (PE)   Essential hypertension, benign   AKI (acute kidney injury) (HCC)   Iron  deficiency anemia   Acute blood loss anemia   Hypotension due to hypovolemia          Assessment: 63 year old male with PE on Coumadin , heart failure with preserved ejection fraction, PAD, diverticulitis, CAD blood per rectum found to have colon perforation requiring multiple surgeries.  He underwent IR guided drain placement on 4/30 of a intra-abdominal abscess.  ID engaged #Postop intra-abdominal abscess status post drain.  Unclear source growing VRE #TPN-attempting to wean - Initially underwent ex lap on 4/15 for intra-abdominal abscess - Reex lap with wound VAC placement/17 Show/19 ileostomy collection - CT on 4/29 showed 10.9 X15.8X 13.1 cm large thick-walled collection in pelvis.  He underwent CT-guided drain placement with 100 mL serosanguineous fluid put collected.  Cultures grew VRE Recommendations:  -Continue daptomycin and unasyn - General Surgery following->Follow plan -repeat imaging closer to discharge,I would like to transition him to linezolid +augmentin  eventually   #AKI -initially required crrt -Nephrology following   Evaluation of this patient requires complex antimicrobial therapy evaluation and counseling + isolation needs for disease transmission risk assessment and mitigation   Microbiology:   Antibiotics: Pip-tazo 4/12-4/24, 4/29-present Linezolid  5/2-5/3 Daptomycin5/5-  Other  SUBJECTIVE: Resting in bed, eating some spghetti Interval: afebrile overngiht  Review of Systems: Review of Systems  All other systems reviewed and are negative.    Scheduled Meds:  Chlorhexidine  Gluconate Cloth  6 each Topical Daily   diclofenac  Sodium  2 g Topical QID   feeding supplement  237  mL Oral BID BM   ferrous sulfate  325 mg Oral Q breakfast   loperamide  4 mg Oral TID   multivitamin with minerals  1 tablet Oral Daily   mouth rinse  15 mL Mouth Rinse 4 times per day   pantoprazole   40 mg Oral BID   polycarbophil  1,250 mg Oral BID   sodium chloride  flush  5 mL Intracatheter Q8H   sucralfate   1 g Oral BID   warfarin  10 mg Oral ONCE-1600   Warfarin - Pharmacist Dosing Inpatient   Does not apply q1600   Continuous Infusions:  sodium chloride  Stopped (01/24/24 2213)   ampicillin-sulbactam (UNASYN) IV 3 g (02/04/24 0906)   chlorproMAZINE  (THORAZINE ) 12.5 mg in sodium chloride  0.9 % 25 mL IVPB     DAPTOmycin 800 mg (02/03/24 1958)   heparin  2,200 Units/hr (02/04/24 0905)   TPN ADULT (ION) 45 mL/hr at 02/03/24 1742   PRN Meds:.sodium chloride , alum & mag hydroxide-simeth, chlorproMAZINE  (THORAZINE ) 12.5 mg in sodium chloride  0.9 % 25 mL IVPB, HYDROmorphone  (DILAUDID ) injection **OR** HYDROmorphone  (DILAUDID ) injection, ipratropium-albuterol , lip balm, LORazepam , methocarbamol  (ROBAXIN ) injection, ondansetron  (ZOFRAN ) IV, mouth rinse, oxyCODONE  No Known Allergies  OBJECTIVE: Vitals:   02/03/24 2221 02/04/24 0401 02/04/24 0842 02/04/24 1243  BP: 128/71 125/74 (!) 145/81 134/80  Pulse: 95 90 94 89  Resp: 20 20 15 16   Temp: 98.6 F (37 C) 98.3 F (36.8 C) 98.6 F (37 C) 98.2 F (36.8 C)  TempSrc: Oral Oral Oral Oral  SpO2: 96% 95%    Weight:  74 kg    Height:       Body mass index is 24.09 kg/m.  Physical Exam Constitutional:  General: He is not in acute distress.    Appearance: He is normal weight. He is not toxic-appearing.  HENT:     Head: Normocephalic and atraumatic.     Right Ear: External ear normal.     Left Ear: External ear normal.     Nose: No congestion or rhinorrhea.     Mouth/Throat:     Mouth: Mucous membranes are moist.     Pharynx: Oropharynx is clear.  Eyes:     Extraocular Movements: Extraocular movements intact.      Conjunctiva/sclera: Conjunctivae normal.     Pupils: Pupils are equal, round, and reactive to light.  Cardiovascular:     Rate and Rhythm: Normal rate and regular rhythm.     Heart sounds: No murmur heard.    No friction rub. No gallop.  Pulmonary:     Effort: Pulmonary effort is normal.     Breath sounds: Normal breath sounds.  Abdominal:     Comments: Abdominal wound  Musculoskeletal:        General: No swelling. Normal range of motion.     Cervical back: Normal range of motion and neck supple.  Skin:    General: Skin is warm and dry.  Neurological:     General: No focal deficit present.     Mental Status: He is oriented to person, place, and time.  Psychiatric:        Mood and Affect: Mood normal.       Lab Results Lab Results  Component Value Date   WBC 7.4 02/04/2024   HGB 7.5 (L) 02/04/2024   HCT 23.9 (L) 02/04/2024   MCV 94.1 02/04/2024   PLT 167 02/04/2024    Lab Results  Component Value Date   CREATININE 2.40 (H) 02/04/2024   BUN 43 (H) 02/04/2024   NA 141 02/04/2024   K 4.4 02/04/2024   CL 109 02/04/2024   CO2 24 02/04/2024    Lab Results  Component Value Date   ALT 53 (H) 02/03/2024   AST 40 02/03/2024   ALKPHOS 70 02/03/2024   BILITOT 0.7 02/03/2024        Orlie Bjornstad, MD [facility] 02/04/2024, 2:03 PM

## 2024-02-04 NOTE — Progress Notes (Addendum)
 [facility]                                                                               Caeden Laveck, is a 63 y.o. male, DOB - 05-23-1961, WUJ:[id number] Admit date - 01/11/2024    Outpatient Primary MD for the patient is Alexander Iba, [name]  LOS - 24  days    Brief summary   63 year old African-American male with past medical history significant for pulmonary embolism on Coumadin  and HFpEF.  Patient presented to the hospital with BRBPR.  Patient was found to have colon perforation.  GI, general surgery were consulted.  Patient underwent ex lap followed by ICU stay.  Developed AKI requiring CRRT.  Intermittent hemodialysis was planned, however, this was not done as patient's renal function seems to be recovering.  Patient continues to make urine.  Serum creatinine has improved from 5.67-4.94 overnight.  Continue to monitor I's and O's.  Wound culture is growing Enterococcus faecium (VRE that is resistant to ampicillin and vancomycin).  Patient is currently on Zyvox .  Infectious disease team has been consulted.  Infectious disease team has suggested using daptomycin.   Significant event. 4/12 admitted for GI bleed and BRBPR.  GI consulted conservative measures.  EGD colonoscopy was recommended. 4/15 repeat CT scan shows evidence of concern for contained perforation in the ascending colon.  General surgery consulted.  Recommending ex lap. 4/16 transferred to ICU. 4/16 cardiorespiratory arrest, achieved ROSC. 4/17 back to the OR, wound VAC placement and small bowel resection. 4/19 Back to the OR s/p exploratory laparotomy, ileostomy creation, small bowel resection, wound VAC in place. nephrology consulted for CRRT 4/23 extubated; off pressors 4/25 Transferred from [facility] ICU to [facility] progressive due to need for hemodialysis. 4/29 triple-lumen CVC placement for TPN. 4/30 CT-guided drain placement.  Dark bloody output.  Anticoagulation on hold.  Also HD catheter placement on right  with plan for inpatient HD. 5/2.  Diet advanced to clears .  5/5 diet advanced to regular today.  No new complaints.    Assessment & Plan    Assessment and Plan:  Spontaneous perforation of colon SP right colectomy. Underwent 3 ex lap as above, SP right hemicolectomy and small bowel resection as well as ileostomy creation. Has a wound VAC. Currently has IV TPN. Advance diet and wean him off TPN in the next few days.  Management per general surgery. 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 Current plan is conservative management with a drain and a controlled fistula. Patient currently on regular diet and tolerating well without any issues. He reports having a BM yesterday.     AKI  Initially required CRRT. Suspect combination of shock with ATN. Possibility of IV contrast injury also cannot be ruled out. Continues to third space.  Reports of arthralgia. Urine output is actually improving.  Nephrology recommend to hold off on HD. Currently has a temp HD catheter. Creatinine improving, currently on 2.40.     Acute blood loss anemia as well as anemia of critical illness Iron  deficiency anemia.  B12 deficiency. Presented with BRBPR.  Hemoglobin at baseline around 10.   Hemoglobin stable around 7. Transfuse to keep it greater than 7.     ENTEROCOCCUS FAECIUM infection. Drain culture is growing E faecium.   ID on  board.  Patient currently on Daptomycin and unasyn. Abdominal wound cultures growing VRE (resistant to ampicillin and vancomycin).   Multifactorial shock. Patient has needed vasopressors throughout the hospital stay multiple times. After aggressive IV hydration as well as IV albumin  blood pressure has improved. Monitor.   Septic shock.  Present on admission. Sepsis physiology appears to have resolved for now.   Hypotension. Resolved.     History of PE. 07/2023. Presented with BRBPR.  Anticoagulation has been on hold.  Was resumed with heparin , transition to warfarin tonight.  Remains at risk for recurrent PE.   Acute hypoxic respiratory failure Requiring intubation. Was extubated on 4/23.  Intermittently on oxygen.  Continue incentive spirometry.   Diverticulitis Treated before.   Hypokalemia Replaced.    Agitation. Likely hospital induced delirium. Monitor.  As needed Ativan .   Hypoglycemia. Resolved.    Acute right knee pain. Concern for gout. Reports pins-and-needles like pain in his right knee. Suspect arthralgia in the setting of need for HD. X-ray unremarkable. Lower extremity venous Doppler ordered. Now appears with swelling and warmth.  No significant redness so far.  Uric acid is normal.  CRP ESR elevated although it could be multifactorial.  Not a candidate for IV steroids, IV Toradol, other oral NSAIDs or colchicine .  Continue pain control for now. Pain controlled.    Hiccups. As needed Thorazine .      Code Status: full code.  DVT Prophylaxis:  SCD's Start: 01/15/24 1400   Level of Care: Level of care: Progressive Family Communication: none at bedside.   Disposition Plan:     Remains inpatient appropriate:  pending clinical improvement.   Procedures:  None    Consultants:   Infectious disease General surgery Nephrology.  IR.   Antimicrobials:   Anti-infectives (From admission, onward)    Start     Dose/Rate Route Frequency Ordered Stop   02/03/24 2000  DAPTOmycin (CUBICIN) 800 mg in sodium chloride  0.9 % IVPB        10 mg/kg  76.5 kg 132 mL/hr over 30 Minutes Intravenous Every 48 hours 02/03/24 0801     02/02/24 1230  Ampicillin-Sulbactam (UNASYN) 3 g in sodium chloride  0.9 % 100 mL IVPB        3 g 200 mL/hr over 30 Minutes Intravenous Every 12 hours 02/02/24 1208     02/01/24 2000  DAPTOmycin (CUBICIN) IVPB 500 mg/50mL premix  Status:  Discontinued        6 mg/kg   76.5 kg 100 mL/hr over 30 Minutes Intravenous Every 48 hours 02/01/24 1853 02/03/24 0801   01/31/24 2200  linezolid  (ZYVOX ) IVPB 600 mg  Status:  Discontinued        600 mg 300 mL/hr over 60 Minutes Intravenous Every 12 hours 01/31/24 2036 02/01/24 1853   01/28/24 1000  piperacillin -tazobactam (ZOSYN ) IVPB 2.25 g  Status:  Discontinued        2.25 g 100 mL/hr over 30 Minutes Intravenous Every 8 hours 01/28/24 0909 02/02/24 1208   01/18/24 1400  piperacillin -tazobactam (ZOSYN ) IVPB 3.375 g  Status:  Discontinued        3.375 g 12.5 mL/hr over 240 Minutes Intravenous Every 6 hours 01/18/24 1241 01/18/24 1242   01/18/24 1400  piperacillin -tazobactam (ZOSYN ) IVPB 3.375 g  3.375 g 100 mL/hr over 30 Minutes Intravenous Every 6 hours 01/18/24 1242 01/23/24 2046   01/18/24 0000  piperacillin -tazobactam (ZOSYN ) IVPB 2.25 g  Status:  Discontinued        2.25 g 100 mL/hr over 30 Minutes Intravenous Every 6 hours 01/17/24 2120 01/18/24 1241   01/12/24 0200  piperacillin -tazobactam (ZOSYN ) IVPB 3.375 g  Status:  Discontinued        3.375 g 12.5 mL/hr over 240 Minutes Intravenous Every 8 hours 01/11/24 2120 01/17/24 2120   01/11/24 1815  piperacillin -tazobactam (ZOSYN ) IVPB 3.375 g        3.375 g 100 mL/hr over 30 Minutes Intravenous  Once 01/11/24 1806 01/11/24 1921        Medications  Scheduled Meds:  Chlorhexidine  Gluconate Cloth  6 each Topical Daily   diclofenac  Sodium  2 g Topical QID   feeding supplement  237 mL Oral BID BM   ferrous sulfate  325 mg Oral Q breakfast   loperamide  4 mg Oral TID   multivitamin with minerals  1 tablet Oral Daily   mouth rinse  15 mL Mouth Rinse 4 times per day   pantoprazole   40 mg Oral BID   polycarbophil  1,250 mg Oral BID   sodium chloride  flush  5 mL Intracatheter Q8H   sucralfate   1 g Oral BID   Warfarin - Pharmacist Dosing Inpatient   Does not apply q1600   Continuous Infusions:  sodium chloride  Stopped (01/24/24 2213)    ampicillin-sulbactam (UNASYN) IV 3 g (02/04/24 0906)   chlorproMAZINE  (THORAZINE ) 12.5 mg in sodium chloride  0.9 % 25 mL IVPB     DAPTOmycin 800 mg (02/03/24 1958)   heparin  2,200 Units/hr (02/04/24 0905)   TPN ADULT (ION) 45 mL/hr at 02/03/24 1742   PRN Meds:.sodium chloride , alum & mag hydroxide-simeth, chlorproMAZINE  (THORAZINE ) 12.5 mg in sodium chloride  0.9 % 25 mL IVPB, HYDROmorphone  (DILAUDID ) injection **OR** HYDROmorphone  (DILAUDID ) injection, ipratropium-albuterol , lip balm, LORazepam , methocarbamol  (ROBAXIN ) injection, ondansetron  (ZOFRAN ) IV, mouth rinse, oxyCODONE     Subjective:   Loral Demarchi was seen and examined today.  Comfortable, wants to be discharged . No new complaints.   Objective:   Vitals:   02/04/24 0401 02/04/24 0842 02/04/24 1243 02/04/24 1558  BP: 125/74 (!) 145/81 134/80 (!) 144/83  Pulse: 90 94 89 96  Resp: 20 15 16 10   Temp: 98.3 F (36.8 C) 98.6 F (37 C) 98.2 F (36.8 C) 98.6 F (37 C)  TempSrc: Oral Oral Oral Oral  SpO2: 95%   97%  Weight: 74 kg     Height:        Intake/Output Summary (Last 24 hours) at 02/04/2024 1657 Last data filed at 02/04/2024 1300 Gross per 24 hour  Intake 1004.44 ml  Output 3780 ml  Net -2775.56 ml   Filed Weights   02/02/24 0513 02/03/24 0628 02/04/24 0401  Weight: 75.5 kg 75.6 kg 74 kg     Exam General exam: Appears calm and comfortable  Respiratory system: Clear to auscultation. Respiratory effort normal. Cardiovascular system: S1 & S2 heard, RRR. No JVD,  Gastrointestinal system: Abdomen is nondistended, soft and nontender. Ileostomy in place .  Central nervous system: Alert and oriented. Extremities: warm extremities.  Skin: IR drain in place mild line wound. Psychiatry:  Mood & affect appropriate.      Data Reviewed:  I have personally reviewed following labs and imaging studies   CBC Lab Results  Component Value Date   WBC 7.4 02/04/2024  RBC 2.54 (L) 02/04/2024   HGB 7.5 (L) 02/04/2024    HCT 23.9 (L) 02/04/2024   MCV 94.1 02/04/2024   MCH 29.5 02/04/2024   PLT 167 02/04/2024   MCHC 31.4 02/04/2024   RDW 17.4 (H) 02/04/2024   LYMPHSABS 1.3 01/29/2024   MONOABS 1.9 (H) 01/29/2024   EOSABS 0.3 01/29/2024   BASOSABS 0.1 01/29/2024     Last metabolic panel Lab Results  Component Value Date   NA 141 02/04/2024   K 4.4 02/04/2024   CL 109 02/04/2024   CO2 24 02/04/2024   BUN 43 (H) 02/04/2024   CREATININE 2.40 (H) 02/04/2024   GLUCOSE 116 (H) 02/04/2024   GFRNONAA 30 (L) 02/04/2024   GFRAA 111 01/22/2020   CALCIUM  8.2 (L) 02/04/2024   PHOS 4.5 02/04/2024   PROT 6.7 02/03/2024   ALBUMIN  <1.5 (L) 02/04/2024   LABGLOB 3.1 01/22/2020   AGRATIO 1.4 01/22/2020   BILITOT 0.7 02/03/2024   ALKPHOS 70 02/03/2024   AST 40 02/03/2024   ALT 53 (H) 02/03/2024   ANIONGAP 8 02/04/2024    CBG (last 3)  No results for input(s): "GLUCAP" in the last 72 hours.     Coagulation Profile: Recent Labs  Lab 02/03/24 1005 02/04/24 0423  INR 1.1 1.2     Radiology Studies: No results found.      Feliciana Horn M.D. [facility] 02/04/2024, 4:57 PM  Available via Epic secure chat 7am-7pm After 7 pm, please refer to night coverage provider listed on amion.

## 2024-02-04 NOTE — Progress Notes (Signed)
 VAST consult. Spoke with Financial controller. Education provided regarding drsg care, continues as usual. VAST maintains only the line running TPN. Patrice VU. Theophilus Fitz, RN

## 2024-02-04 NOTE — Progress Notes (Signed)
 Progress Note  17 Days Post-Op  Subjective: Feeling well today.  Seems to be eating well this morning.  No nausea.  Wants to get up with PT  Objective: Vital signs in last 24 hours: Temp:  [98 F (36.7 C)-99.2 F (37.3 C)] 98.6 F (37 C) (05/06 0842) Pulse Rate:  [89-97] 94 (05/06 0842) Resp:  [15-20] 15 (05/06 0842) BP: (125-145)/(71-89) 145/81 (05/06 0842) SpO2:  [95 %-100 %] 95 % (05/06 0401) Weight:  [74 kg] 74 kg (05/06 0401) Last BM Date : 02/03/24  Intake/Output from previous day: 05/05 0701 - 05/06 0700 In: 2318.5 [P.O.:360; I.V.:1692.3; IV Piggyback:266.2] Out: 4300 [Urine:2350; Drains:75; Stool:1875] Intake/Output this shift: Total I/O In: -  Out: 1000 [Urine:1000]  PE: Gen: awake, alert Abd: Soft, appropriately tender. IR drain still bloody appearance, surgical drain with serous looking fluid. midline wound as pictured below with visible bowel and fascial dehiscence, no enteric drainage from midline, but some brown/purulent appearing drainage today from midportion of wound, a bit more than yesterday. Ileostomy output thin and watery    Lab Results:  Recent Labs    02/03/24 0340 02/04/24 0423  WBC 7.0 7.4  HGB 7.4* 7.5*  HCT 23.7* 23.9*  PLT 174 167   BMET Recent Labs    02/03/24 0340 02/04/24 0423  NA 141  138 140  K 4.2  4.1 4.4  CL 110  108 110  CO2 21*  21* 23  GLUCOSE 123*  125* 109*  BUN 54*  54* 44*  CREATININE 3.15*  3.05* 2.41*  CALCIUM  8.1*  7.9* 8.1*   PT/INR Recent Labs    02/03/24 1005 02/04/24 0423  LABPROT 14.6 15.4*  INR 1.1 1.2   CMP     Component Value Date/Time   NA 140 02/04/2024 0423   NA 141 01/22/2020 1137   K 4.4 02/04/2024 0423   CL 110 02/04/2024 0423   CO2 23 02/04/2024 0423   GLUCOSE 109 (H) 02/04/2024 0423   BUN 44 (H) 02/04/2024 0423   BUN 13 01/22/2020 1137   CREATININE 2.41 (H) 02/04/2024 0423   CREATININE 0.98 06/14/2017 0935   CALCIUM  8.1 (L) 02/04/2024 0423   PROT 6.7 02/03/2024  0340   PROT 7.3 01/22/2020 1137   ALBUMIN  <1.5 (L) 02/04/2024 0423   ALBUMIN  4.2 01/22/2020 1137   AST 40 02/03/2024 0340   ALT 53 (H) 02/03/2024 0340   ALKPHOS 70 02/03/2024 0340   BILITOT 0.7 02/03/2024 0340   BILITOT 0.4 01/22/2020 1137   GFRNONAA 30 (L) 02/04/2024 0423   GFRNONAA >89 09/17/2014 1253   GFRAA 111 01/22/2020 1137   GFRAA >89 09/17/2014 1253   Lipase     Component Value Date/Time   LIPASE 22 09/28/2019 1109       Studies/Results: No results found.   Anti-infectives: Anti-infectives (From admission, onward)    Start     Dose/Rate Route Frequency Ordered Stop   02/03/24 2000  DAPTOmycin (CUBICIN) 800 mg in sodium chloride  0.9 % IVPB        10 mg/kg  76.5 kg 132 mL/hr over 30 Minutes Intravenous Every 48 hours 02/03/24 0801     02/02/24 1230  Ampicillin-Sulbactam (UNASYN) 3 g in sodium chloride  0.9 % 100 mL IVPB        3 g 200 mL/hr over 30 Minutes Intravenous Every 12 hours 02/02/24 1208     02/01/24 2000  DAPTOmycin (CUBICIN) IVPB 500 mg/50mL premix  Status:  Discontinued  6 mg/kg  76.5 kg 100 mL/hr over 30 Minutes Intravenous Every 48 hours 02/01/24 1853 02/03/24 0801   01/31/24 2200  linezolid  (ZYVOX ) IVPB 600 mg  Status:  Discontinued        600 mg 300 mL/hr over 60 Minutes Intravenous Every 12 hours 01/31/24 2036 02/01/24 1853   01/28/24 1000  piperacillin -tazobactam (ZOSYN ) IVPB 2.25 g  Status:  Discontinued        2.25 g 100 mL/hr over 30 Minutes Intravenous Every 8 hours 01/28/24 0909 02/02/24 1208   01/18/24 1400  piperacillin -tazobactam (ZOSYN ) IVPB 3.375 g  Status:  Discontinued        3.375 g 12.5 mL/hr over 240 Minutes Intravenous Every 6 hours 01/18/24 1241 01/18/24 1242   01/18/24 1400  piperacillin -tazobactam (ZOSYN ) IVPB 3.375 g        3.375 g 100 mL/hr over 30 Minutes Intravenous Every 6 hours 01/18/24 1242 01/23/24 2046   01/18/24 0000  piperacillin -tazobactam (ZOSYN ) IVPB 2.25 g  Status:  Discontinued        2.25 g 100  mL/hr over 30 Minutes Intravenous Every 6 hours 01/17/24 2120 01/18/24 1241   01/12/24 0200  piperacillin -tazobactam (ZOSYN ) IVPB 3.375 g  Status:  Discontinued        3.375 g 12.5 mL/hr over 240 Minutes Intravenous Every 8 hours 01/11/24 2120 01/17/24 2120   01/11/24 1815  piperacillin -tazobactam (ZOSYN ) IVPB 3.375 g        3.375 g 100 mL/hr over 30 Minutes Intravenous  Once 01/11/24 1806 01/11/24 1921        Assessment/Plan  LAPAROTOMY, EXPLORATORY RIGHT COLECTOMY (without anastomosis) PLACEMENT OF ABTHERA WOUND VAC DRAINAGE OF INTRA-ABDOMINAL ABSCESS  4/16 EW   RE-EXPLORATION ABDOMEN, PLACEMENT OF ABTHERA WOUND VAC, SMALL BOWEL RESECTION 01/16/2024 EW   Re-exploration abdomen, small bowel resection, creation of end ileostomy, abd closure, placement wound vac 4/19 EW   -  CT scan was certainly concerning for leak given presumed extravasation of contrast noted in this fluid collection however, drain is bloody right now with no succus noted despite FLD. Will allow regular diet today and monitor.  Can start to wean TNA - IR drain placed in suspect abscess/leak 4/30-output has been bloody but not purulent or enteric.  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  Currently on Unasyn and Cubicin - Cont to monitor ostomy output - increased imodium and fiber - Completed post op abx, but WBC increasing, tachy, lethargic 4/29, with increasing temp-Zosyn  restarted  WBC normalized, see above for current abx regimen.  Per ID - fascial dehiscence without evisceration.  This appears well scarred in for now.  VAC to be placed today with white sponge on top of bowel and underneath fascial sutures with black sponge on top.   -regular diet, wean TNA to off today     FEN - regular diet, wean TNA off today, breeze, imodium, fiber VTE - SCDs, heparin  gtt  ID - Completed post op abx. reinititiation of Zosyn  on 4/29, changed to zyvox  5/2, changed to unasyn/Cubicin 5/4 (per ID)   - Per TR[facility] -   Pleural effusions Septic shock - resolved ABL anemia - monitor and transfuse as needed  Coagulopathy - resolved AKI - started CRRT 4/19,  defer to renal.  Holding HD as UOP is good and Cr improving Acute hypoxemic resp failure - extubated H/o VTE/PE/DVT - scds for now; was on coumadin    LOS: 24 days      Alexander [name], [facility]  Surgery 02/04/2024, 9:09 AM Please see Amion for pager number during day hours 7:00am-4:30pm
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
## 2024-02-04 NOTE — Consult Note (Signed)
 WOC Nurse Consult Note: Reason for Consult: placement of NPWT midline Discussed with CCS PA prior to placement this am Wound type: surgical  Pressure Injury POA: NA Measurement:17cm x 6cm x 4cm @ proximal wound end, 3cm at distal wound end  Wound bed: visible fascial sutures x 4, soupy wound bed at distal and proximal wound aspects, clean, pink, some yellow at proximal wound edge Drainage (amount, consistency, odor) serosanguinous  Periwound: intact  Dressing procedure/placement/frequency: 2 pc of white foam, one place under fascial sutures at the proximal wound aspect, one placed at umbilicus under suture.  2pc of black used to fill the remainder of the wound bed,  Sealed at , patient tolerated, IV pain meds given prior to dressing change.   WOC Nurse ostomy follow up Stoma type/location: RUQ, ileostomy Stomal assessment/size: 1", round, budded, pink, moist, os at center Peristomal assessment: intact  Treatment options for stomal/peristomal skin: 2" ostomy barrier ring Output high volume; liquid green, 300cc emptied at the time of pouch change, nursing aware to record Ostomy pouching: 1pc.soft convex with 2" barrier ring  Education provided:  Attempted to use emptying as teaching moment, patient is not interested at this time and has received IV pain meds, seems to be little less receptive to teaching for that reason.  Enrolled patient in [place] Secure Start Discharge program: Yes  4 soft convex, 4 barrier rings in room  2 large VAC dresing kits, 3 white foam in the room for use.   Patient using abdominal binder with opening cut for ostomy pouch.   Will see patient again on Thursday for dressing change and pouch change   Jinger Middlesworth [facility], CNS, [employer] [phone]

## 2024-02-04 NOTE — Progress Notes (Signed)
 Chief Complaint: Patient was seen today for intra abdominal fluid collection   Referring Physician(s): Marlin Simmonds, PA-C  Supervising Physician: Elene Griffes  Patient Status: [facility] - In-pt  Subjective: 5/6: patient sitting upright in bedside chair. Patient denies pain. History limited today.   Objective: Physical Exam: BP 134/80 (BP Location: Left Arm)   Pulse 89   Temp 98.2 F (36.8 C) (Oral)   Resp 16   Ht 5\' 9"  (1.753 m)   Wt 163 lb 2.3 oz (74 kg)   SpO2 95%   BMI 24.09 kg/m  Constitutional: adult male, alert, sitting upright in bedside chair, INAD. Skin: warm and dry. Two drains in place. IR drain with 15 cc bloody output. No TTP over drain insertion site.  Chest: symmetric rise and fall.  Pulmonary : breathing comfortably on room air.  Neurological: alert and oriented.  Psychiatric: normal insight and judgement, non pressured speech.     Current Facility-Administered Medications:    0.9 %  sodium chloride  infusion, , Intravenous, PRN, Olalere, Adewale A, MD, Stopped at 01/24/24 2213   alum & mag hydroxide-simeth (MAALOX/MYLANTA) 200-200-20 MG/5ML suspension 15 mL, 15 mL, Oral, Q6H PRN, Ogbata, Sylvester I, MD, 15 mL at 02/04/24 1430   Ampicillin-Sulbactam (UNASYN) 3 g in sodium chloride  0.9 % 100 mL IVPB, 3 g, Intravenous, Q12H, Orlie Bjornstad, MD, Last Rate: 200 mL/hr at 02/04/24 0906, 3 g at 02/04/24 1610   Chlorhexidine  Gluconate Cloth 2 % PADS 6 each, 6 each, Topical, Daily, [name], MD, 6 each at 02/04/24 0800   chlorproMAZINE  (THORAZINE ) 12.5 mg in sodium chloride  0.9 % 25 mL IVPB, 12.5 mg, Intravenous, Q8H PRN, Patel, Pranav M, MD   DAPTOmycin (CUBICIN) 800 mg in sodium chloride  0.9 % IVPB, 10 mg/kg, Intravenous, Q48H, Racheal Buddle, RPH, Last Rate: 132 mL/hr at 02/03/24 1958, 800 mg at 02/03/24 1958   diclofenac  Sodium (VOLTAREN ) 1 % topical gel 2 g, 2 g, Topical, QID, Patel, Pranav M, MD, 2 g at 02/04/24 1432   feeding supplement (ENSURE ENLIVE /  ENSURE PLUS) liquid 237 mL, 237 mL, Oral, BID BM, Chen, Lydia D, RPH, 237 mL at 02/02/24 1336   ferrous sulfate tablet 325 mg, 325 mg, Oral, Q breakfast, Marlin Simmonds, PA-C, 325 mg at 02/04/24 0913   heparin  ADULT infusion 100 units/mL (25000 units/250mL), 2,200 Units/hr, Intravenous, Continuous, Sheron Dixons, [facility], Last Rate: 22 mL/hr at 02/04/24 0905, 2,200 Units/hr at 02/04/24 0905   HYDROmorphone  (DILAUDID ) injection 0.5 mg, 0.5 mg, Intravenous, Q3H PRN, 0.5 mg at 01/31/24 0054 **OR** HYDROmorphone  (DILAUDID ) injection 1 mg, 1 mg, Intravenous, Q3H PRN, Patel, Pranav M, MD, 1 mg at 02/04/24 0907   ipratropium-albuterol  (DUONEB) 0.5-2.5 (3) MG/3ML nebulizer solution 3 mL, 3 mL, Nebulization, Q4H PRN, Hunsucker, Archer Kobs, MD   lip balm (CARMEX) ointment 1 Application, 1 Application, Topical, PRN, Unk Garb, DO, 1 Application at 01/26/24 0855   loperamide (IMODIUM) capsule 4 mg, 4 mg, Oral, TID, Johnson, Kelly R, PA-C, 4 mg at 02/04/24 9604   LORazepam  (ATIVAN ) injection 0.5 mg, 0.5 mg, Intravenous, Q6H PRN, Patel, Pranav M, MD   methocarbamol  (ROBAXIN ) injection 500 mg, 500 mg, Intravenous, Q6H PRN, Aldon Hung A, MD, 500 mg at 02/03/24 1358   multivitamin with minerals tablet 1 tablet, 1 tablet, Oral, Daily, Corrin Dimes, [facility], 1 tablet at 02/04/24 0911   ondansetron  (ZOFRAN ) injection 4 mg, 4 mg, Intravenous, Q6H PRN, Unk Garb, DO, 4 mg at 01/28/24 5409   Oral care  mouth rinse, 15 mL, Mouth Rinse, 4 times per day, Hunsucker, Archer Kobs, MD, 15 mL at 02/04/24 0800   Oral care mouth rinse, 15 mL, Mouth Rinse, PRN, Hunsucker, Archer Kobs, MD   oxyCODONE  (ROXICODONE ) 5 MG/5ML solution 5 mg, 5 mg, Oral, Q4H PRN, Annetta Killian, PA-C, 5 mg at 02/04/24 0033   pantoprazole  (PROTONIX ) EC tablet 40 mg, 40 mg, Oral, BID, Chen, Lydia D, RPH, 40 mg at 02/04/24 0911   polycarbophil (FIBERCON) tablet 1,250 mg, 1,250 mg, Oral, BID, Annetta Killian, PA-C, 1,250 mg at 02/04/24 0911   sodium chloride   flush (NS) 0.9 % injection 5 mL, 5 mL, Intracatheter, Q8H, Suttle, Dylan J, MD, 5 mL at 02/04/24 0915   sucralfate  (CARAFATE ) 1 GM/10ML suspension 1 g, 1 g, Oral, BID, Kraig Peru, MD, 1 g at 02/04/24 0911   TPN ADULT (ION), , Intravenous, Continuous TPN, Corrin Dimes, [facility], Last Rate: 45 mL/hr at 02/03/24 1742, New Bag at 02/03/24 1742   warfarin (COUMADIN ) tablet 10 mg, 10 mg, Oral, ONCE-1600, Akula, Vijaya, MD   Warfarin - Pharmacist Dosing Inpatient, , Does not apply, [id number], Feliciana Horn, MD  Labs: CBC Recent Labs    02/03/24 0340 02/04/24 0423  WBC 7.0 7.4  HGB 7.4* 7.5*  HCT 23.7* 23.9*  PLT 174 167   BMET Recent Labs    02/04/24 0423 02/04/24 0500  NA 140 141  K 4.4 4.4  CL 110 109  CO2 23 24  GLUCOSE 109* 116*  BUN 44* 43*  CREATININE 2.41* 2.40*  CALCIUM  8.1* 8.2*   LFT Recent Labs    02/03/24 0340 02/04/24 0423 02/04/24 0500  PROT 6.7  --   --   ALBUMIN  <1.5*  <1.5*   < > <1.5*  AST 40  --   --   ALT 53*  --   --   ALKPHOS 70  --   --   BILITOT 0.7  --   --    < > = values in this interval not displayed.   PT/INR Recent Labs    02/03/24 1005 02/04/24 0423  LABPROT 14.6 15.4*  INR 1.1 1.2     Studies/Results: No results found.  Assessment/Plan: Intra-abdominal fluid collection  Drain Location: right of midline surgical wound  Size: Fr size: 10 Fr Date of placement: 01/29/24 Currently to: Drain collection device: suction bulb 24 hour output:  Output by Drain (mL) 02/02/24 0700 - 02/02/24 1459 02/02/24 1500 - 02/02/24 2259 02/02/24 2300 - 02/03/24 0659 02/03/24 0700 - 02/03/24 1459 02/03/24 1500 - 02/03/24 2259 02/03/24 2300 - 02/04/24 0659 02/04/24 0700 - 02/04/24 1459 02/04/24 1500 - 02/04/24 1510  Closed System Drain LLQ Bulb (JP) 19 Fr. 20 10 0 0 10 5    Closed System Drain 2 Midline Abdomen Bulb (JP) 10 Fr. 20 25 0 20 25 15       Interval imaging/drain manipulation:  TBD  Current examination: Flushes/aspirates easily.   Insertion site unremarkable. Suture and stat lock in place. Dressed appropriately.   Plan: Continue TID flushes with 5 cc NS. Record output Q shift. Dressing changes QD or PRN if soiled.  Call IR APP or on call IR MD if difficulty flushing or sudden change in drain output.  Repeat imaging/possible drain injection once output < 10 mL/QD (excluding flush material). Consideration for drain removal if output is < 10 mL/QD (excluding flush material), pending discussion with the providing surgical service.  Discharge planning: Please contact IR APP or on  call IR MD prior to patient d/c to ensure appropriate follow up plans are in place. Typically patient will follow up with IR clinic 10-14 days post d/c for repeat imaging/possible drain injection. IR scheduler will contact patient with date/time of appointment. Patient will need to flush drain QD with 5 cc NS, record output QD, dressing changes every 2-3 days or earlier if soiled.   IR will continue to follow - please call with questions or concerns.      LOS: 24 days   I spent a total of 15 minutes in face to face in clinical consultation, greater than 50% of which was counseling/coordinating care for intra abdominal fluid collection   Mckenzee Beem N Eurika Sandy PA-C 02/04/2024 3:10 PM

## 2024-02-04 NOTE — Plan of Care (Signed)
  Problem: Education: Goal: Knowledge of General Education information will improve Description: Including pain rating scale, medication(s)/side effects and non-pharmacologic comfort measures Outcome: Progressing   Problem: Health Behavior/Discharge Planning: Goal: Ability to manage health-related needs will improve Outcome: Progressing   Problem: Clinical Measurements: Goal: Ability to maintain clinical measurements within normal limits will improve Outcome: Progressing Goal: Will remain free from infection Outcome: Progressing Goal: Diagnostic test results will improve Outcome: Progressing Goal: Respiratory complications will improve Outcome: Progressing Goal: Cardiovascular complication will be avoided Outcome: Progressing   Problem: Nutrition: Goal: Adequate nutrition will be maintained Outcome: Progressing   Problem: Coping: Goal: Level of anxiety will decrease Outcome: Progressing   Problem: Elimination: Goal: Will not experience complications related to bowel motility Outcome: Progressing Goal: Will not experience complications related to urinary retention Outcome: Progressing   Problem: Pain Managment: Goal: General experience of comfort will improve and/or be controlled Outcome: Progressing   Problem: Safety: Goal: Ability to remain free from injury will improve Outcome: Progressing   Problem: Skin Integrity: Goal: Risk for impaired skin integrity will decrease Outcome: Progressing   Problem: Activity: Goal: Ability to tolerate increased activity will improve Outcome: Progressing   Problem: Respiratory: Goal: Ability to maintain a clear airway and adequate ventilation will improve Outcome: Progressing   Problem: Role Relationship: Goal: Method of communication will improve Outcome: Progressing   Problem: Education: Goal: Ability to describe self-care measures that may prevent or decrease complications (Diabetes Survival Skills Education) will  improve Outcome: Progressing Goal: Individualized Educational Video(s) Outcome: Progressing   Problem: Coping: Goal: Ability to adjust to condition or change in health will improve Outcome: Progressing   Problem: Fluid Volume: Goal: Ability to maintain a balanced intake and output will improve Outcome: Progressing   Problem: Health Behavior/Discharge Planning: Goal: Ability to identify and utilize available resources and services will improve Outcome: Progressing Goal: Ability to manage health-related needs will improve Outcome: Progressing   Problem: Metabolic: Goal: Ability to maintain appropriate glucose levels will improve Outcome: Progressing   Problem: Nutritional: Goal: Maintenance of adequate nutrition will improve Outcome: Progressing Goal: Progress toward achieving an optimal weight will improve Outcome: Progressing   Problem: Skin Integrity: Goal: Risk for impaired skin integrity will decrease Outcome: Progressing   Problem: Tissue Perfusion: Goal: Adequacy of tissue perfusion will improve Outcome: Progressing

## 2024-02-04 NOTE — Progress Notes (Addendum)
 PHARMACY - TOTAL PARENTERAL NUTRITION CONSULT NOTE   Indication: colon perforation,  intolerance to enteral nutrition  Patient Measurements: Height: 5\' 9"  (175.3 cm) Weight: 74 kg (163 lb 2.3 oz) IBW/kg (Calculated) : 70.7 TPN AdjBW (KG): 78.1 Body mass index is 24.09 kg/m.  Assessment:  54 YOM admitted 4/12 for rectal bleeding and weakness s/p multiple GI surgeries with findings of colonic perforation requiring resection of R colon, closure on 4/19. Pt with high OG output post-operatively with no meal intake since admission. Prior to admission, patient reports intake of "normal food". Pharmacy to start TPN for prolonged duration without enteral nutrition and prolonged ileus.   TPN discontinued 4/28 with full liquid diet and displaced central line, however resumed 4/30 with new line due to need for bowel rest and findings on 4/29 CT indicating patient is high risk for return to OR for EC fistula or additional perforations. Patient refused replacement of NGT.  5/4 patient kidney function is recovering with good urine and high ostomy output. He states that he is drinking a good amount of fluid daily. No LE edema. Will unconcentrate TPN. 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  Glucose / Insulin : no hx DM, CBG < 140, off SSI Electrolytes: K 4.4, CO2 23, CoCa 10, others wnl  Renal: SCr 2.41 down (BL 1) , BUN 44 - CRRT 4/19 >> 4/24. Last HD 4/27 PM with 1L removed.  Hepatic: LFTs WNL, ALT 53, albumin  <1.5, TG 48 Intake / Output; MIVF: UOP 1.3 ml/kg/hr, drain 75 mL, stool 1875 mL on loperamide TID + fibercon  GI Imaging: 4/15 CT A/P: wall thickening of R colon with inflammation and suspected enlarging contained perforation 2/2 diverticulitis, colitis or neoplasm. No organized fluid collection or pneumoperitoneum. Interval increased gastric distension consistent with an ileus. 4/16 KUB: gas and distended loops of colon 4/23 KUB: decreased prominence of  SB loops 4/25 CT A/P: 5.9 cm fluid collection inferior to the gallbladder, possibly hepatic abscess 4/29 CT: extravasated oral contrast vs hematoma, large abdominal/pelvic fluid collection concerning for gas and possible fistula vs SB perforation; colonic diverticulosis; gaseous distension of SB indicate possible ileus, anasarca  GI Surgeries / Procedures:  4/16 Right colectomy, drainage of abscess 4/17 Re-exploration abdomen, small bowel resection 4/19 Re-exploration abdomen, small bowel resection, creation of end ileostomy, abd closure and placement of wound vac 4/28 NGT removed  4/30 Abd drain placed -Dark bloody output.   Central access: CVC 4/29, temp HD cath placed 4/30  TPN start date: 4/21-4/28; 4/30 >>  Nutritional Goals: Concentrated: Goal rate of 65 mL/hr will provide 106 g protein and 2040 kcal Not concentrated: Goal rate of 85 ml/hr will provide 106 g protein and 2097 kcal  RD Assessment:  Estimated Needs Total Energy Estimated Needs: 2000-2250 kcals Total Protein Estimated Needs: 100-115 grams Total Fluid Estimated Needs: >/= 2L  Current Nutrition:  NPO and TPN 5/2 CLD 5/3 FLD - ate 3-4 spoonfuls of grits, jello, pudding 5/4 FLD - ate over half a bowl of grits, most of a chocolate pudding, jello, 1 Ensure, broth, ice pops, 1 magic cup  5/5 reg diet  Plan:  Stop TPN per Surgery after current bag completes today @1800  Electrolytes in TPN: Na 100 mEq/L, K 80 mEq/L, Ca 3 mEq/L, Mg 10 mEq/L, phos 20 mEq/L. ZO:XWRUEAV to max acet Remove standard MVI and trace elements to TPN, give PO   Thank you for allowing pharmacy to be a part of this patient's care.  Claudia Cuff,  PharmD, BCPS Clinical Pharmacist

## 2024-02-05 DIAGNOSIS — K631 Perforation of intestine (nontraumatic): Secondary | ICD-10-CM | POA: Diagnosis not present

## 2024-02-05 LAB — PROTIME-INR
INR: 1.3 — ABNORMAL HIGH (ref 0.8–1.2)
Prothrombin Time: 16 s — ABNORMAL HIGH (ref 11.4–15.2)

## 2024-02-05 LAB — CBC
HCT: 24 % — ABNORMAL LOW (ref 39.0–52.0)
Hemoglobin: 7.5 g/dL — ABNORMAL LOW (ref 13.0–17.0)
MCH: 29.3 pg (ref 26.0–34.0)
MCHC: 31.3 g/dL (ref 30.0–36.0)
MCV: 93.8 fL (ref 80.0–100.0)
Platelets: 171 10*3/uL (ref 150–400)
RBC: 2.56 MIL/uL — ABNORMAL LOW (ref 4.22–5.81)
RDW: 17.3 % — ABNORMAL HIGH (ref 11.5–15.5)
WBC: 8.5 10*3/uL (ref 4.0–10.5)
nRBC: 0 % (ref 0.0–0.2)

## 2024-02-05 LAB — RENAL FUNCTION PANEL
Albumin: <1.5 g/dL — ABNORMAL LOW (ref 3.5–5.0)
Anion gap: 8 (ref 5–15)
BUN: 35 mg/dL — ABNORMAL HIGH (ref 8–23)
CO2: 23 mmol/L (ref 22–32)
Calcium: 8.2 mg/dL — ABNORMAL LOW (ref 8.9–10.3)
Chloride: 107 mmol/L (ref 98–111)
Creatinine, Ser: 2.34 mg/dL — ABNORMAL HIGH (ref 0.61–1.24)
GFR, Estimated: 31 mL/min — ABNORMAL LOW (ref >60)
Glucose, Bld: 95 mg/dL (ref 70–99)
Phosphorus: 4.2 mg/dL (ref 2.5–4.6)
Potassium: 4.4 mmol/L (ref 3.5–5.1)
Sodium: 138 mmol/L (ref 135–145)

## 2024-02-05 LAB — HEPARIN LEVEL (UNFRACTIONATED): Heparin Unfractionated: 0.5 [IU]/mL (ref 0.30–0.70)

## 2024-02-05 MED ORDER — SODIUM CHLORIDE 0.9 % IV SOLN
10.0000 mg/kg | Freq: Every day | INTRAVENOUS | Status: DC
  Administered 2024-02-05 – 2024-02-09 (×5): 800 mg via INTRAVENOUS
  Filled 2024-02-05 (×6): qty 16

## 2024-02-05 MED ORDER — SODIUM CHLORIDE 0.9 % IV SOLN
3.0000 g | Freq: Four times a day (QID) | INTRAVENOUS | Status: DC
  Administered 2024-02-05 – 2024-02-06 (×6): 3 g via INTRAVENOUS
  Filled 2024-02-05 (×6): qty 8

## 2024-02-05 MED ORDER — WARFARIN SODIUM 7.5 MG PO TABS
12.5000 mg | ORAL_TABLET | Freq: Once | ORAL | Status: AC
  Administered 2024-02-05: 12.5 mg via ORAL
  Filled 2024-02-05: qty 1

## 2024-02-05 MED ORDER — HEPARIN (PORCINE) 25000 UT/250ML-% IV SOLN
2000.0000 [IU]/h | INTRAVENOUS | Status: DC
  Administered 2024-02-05: 2100 [IU]/h via INTRAVENOUS
  Administered 2024-02-06 – 2024-02-07 (×2): 2000 [IU]/h via INTRAVENOUS
  Filled 2024-02-05 (×3): qty 250

## 2024-02-05 MED ORDER — ACETAMINOPHEN 325 MG PO TABS
650.0000 mg | ORAL_TABLET | Freq: Four times a day (QID) | ORAL | Status: DC | PRN
  Administered 2024-02-06 – 2024-02-10 (×6): 650 mg via ORAL
  Filled 2024-02-05 (×6): qty 2

## 2024-02-05 MED ORDER — LOPERAMIDE HCL 2 MG PO CAPS
4.0000 mg | ORAL_CAPSULE | Freq: Four times a day (QID) | ORAL | Status: DC
  Administered 2024-02-05 – 2024-02-10 (×22): 4 mg via ORAL
  Filled 2024-02-05 (×23): qty 2

## 2024-02-05 NOTE — Progress Notes (Signed)
 Alexander [name]  ZOX:[id number] DOB: 01/09/1961 DOA: 01/11/2024 PCP: Alexander Iba, PA    Brief Narrative:  63 year old with a history of pulmonary embolism on chronic Coumadin  and diastolic CHF who presented to the hospital 4/12 with bright red blood per rectum.  Workup ultimately revealed a perforated colon.  Patient underwent an exploratory laparotomy followed by a stay in the ICU.  During his hospitalization he developed acute kidney failure requiring CRRT with subsequent improvement in renal function prior to the initiation of intermittent dialysis.  He continues on broad-spectrum antibiotics for his intra-abdominal and wound infections and is being followed by General Surgery and Infectious Disease.  Significant Events: 4/12 admit with BRBPR 4/15 repeat CT suggest contained perforation of the ascending colon -General Surgery evaluated 4/16 exploratory laparotomy -postop CODE BLUE 4/17 back to OR -small bowel resection and wound VAC placement 4/19 back to OR -exploratory laparotomy, ileostomy creation, small bowel resection, wound VAC placement -CRRT initiated 4/23 extubated -off pressors 4/25 transfer from [facility] to [facility] progressive care for possible HD 4/29 triple-lumen CVC placement for TPN 4/30 CT-guided drain placement abdominal abscess -HD catheter placement 5/2 diet advanced to clears 5/5 diet advanced to regular  Goals of Care:   Code Status: Full Code   DVT prophylaxis: SCD's Start: 01/15/24 1400   Interim Hx: Afebrile.  Vital signs stable.  The patient is resting comfortably the time of my visit.  He is in good spirits.  He tells me that his oral intake is good and that he has a very strong appetite.  He denies chest pain or shortness of breath.  He is motivated to be discharged to soon as possible.  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 Has required multiple follow-up surgeries as noted above -now status post  right hemicolectomy and partial small bowel resection with ileostomy creation -wound VAC in place -can now begin weaning TPN -diet per general surgery -broad-spectrum antibiotic as per ID  Acute kidney failure Required CRRT during initial portion of hospital stay -due to combination of septic shock leading to ATN as well as possible IV contrast injury -did not require initiation of intermittent HD -temporary HD catheter in place -renal function remains stable/is improving  Acute blood loss anemia with anemia of critical illness -iron  and B12 deficiencies Transfuse as needed to keep hemoglobin 7.0 or > -hemoglobin holding steady for now  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 Drain culture growing this organism -antibiotic per ID recommendations  Septic shock -hypotension Required vasopressors during initial portion of hospital stay -blood pressure is now stable without vasopressor support  History of PE October 2024 Anticoagulation held initially with BRBPR - has now been transitioned back to warfarin with IV heparin  bridge  Acute hypoxic respiratory failure due to intra-abdominal catastrophe and septic shock with cardiopulmonary arrest Required short-term intubation - extubated successfully 4/23  Encephalopathy -ICU induced delirium  Acute right knee pain  Plain film x-ray unrevealing -uric acid normal   Family Communication: No family present at time of exam Disposition: Investigating possibility of LTAC stay -otherwise will need SNF rehab stay -complex wound care complicating disposition   Objective: Blood pressure 133/78, pulse 88, temperature 98 F (36.7 C), temperature source Oral, resp. rate 12, height 5\' 9"  (1.753 m), weight 74.2 kg, SpO2 98%.  Intake/Output Summary (Last 24 hours) at 02/05/2024 0824 Last data filed at 02/05/2024 0531 Gross per 24 hour  Intake 1616.75 ml  Output 5600 ml  Net -3983.25 ml  Filed Weights   02/03/24 0628 02/04/24 0401  02/05/24 0508  Weight: 75.6 kg 74 kg 74.2 kg    Examination: General: No acute respiratory distress Lungs: Clear to auscultation bilaterally without wheezes or crackles Cardiovascular: Regular rate and rhythm without murmur gallop or rub normal S1 and S2 Abdomen: Soft, wound VAC in place, bowel sounds present peripherally, no rebound Extremities: No significant cyanosis, clubbing, or edema bilateral lower extremities  CBC: Recent Labs  Lab 02/03/24 0340 02/04/24 0423 02/05/24 0440  WBC 7.0 7.4 8.5  HGB 7.4* 7.5* 7.5*  HCT 23.7* 23.9* 24.0*  MCV 94.0 94.1 93.8  PLT 174 167 171   Basic Metabolic Panel: Recent Labs  Lab 02/02/24 0520 02/03/24 0340 02/04/24 0423 02/04/24 0500 02/05/24 0440  NA 141 141  138 140 141 138  K 3.4* 4.2  4.1 4.4 4.4 4.4  CL 109 110  108 110 109 107  CO2 21* 21*  21* 23 24 23   GLUCOSE 108* 123*  125* 109* 116* 95  BUN 64* 54*  54* 44* 43* 35*  CREATININE 3.96* 3.15*  3.05* 2.41* 2.40* 2.34*  CALCIUM  8.0* 8.1*  7.9* 8.1* 8.2* 8.2*  MG 2.2 1.9 1.8  --   --   PHOS 2.9 3.2  3.3 4.1 4.5 4.2   GFR: Estimated Creatinine Clearance: 33.2 mL/min (A) (by C-G formula based on SCr of 2.34 mg/dL (H)).   Scheduled Meds:  Chlorhexidine  Gluconate Cloth  6 each Topical Daily   diclofenac  Sodium  2 g Topical QID   feeding supplement  237 mL Oral BID BM   ferrous sulfate  325 mg Oral Q breakfast   loperamide  4 mg Oral QID   multivitamin with minerals  1 tablet Oral Daily   mouth rinse  15 mL Mouth Rinse 4 times per day   pantoprazole   40 mg Oral BID   polycarbophil  1,250 mg Oral BID   sodium chloride  flush  5 mL Intracatheter Q8H   sucralfate   1 g Oral BID   Warfarin - Pharmacist Dosing Inpatient   Does not apply q1600   Continuous Infusions:  sodium chloride  Stopped (01/24/24 2213)   ampicillin-sulbactam (UNASYN) IV     chlorproMAZINE  (THORAZINE ) 12.5 mg in sodium chloride  0.9 % 25 mL IVPB     DAPTOmycin     heparin  2,200 Units/hr  (02/05/24 0100)     LOS: 25 days   Abbe Abate, MD [facility] Office  [phone] Pager - Text Page per Tilford Foley  If 7PM-7AM, please contact night-coverage per Amion 02/05/2024, 8:24 AM

## 2024-02-05 NOTE — Progress Notes (Signed)
 Progress Note  18 Days Post-Op  Subjective: No complaints.  Eating well.  Got up with therapies yesterday.  Objective: Vital signs in last 24 hours: Temp:  [98 F (36.7 C)-98.6 F (37 C)] 98 F (36.7 C) (05/07 0508) Pulse Rate:  [88-99] 88 (05/07 0508) Resp:  [10-20] 12 (05/07 0508) BP: (133-145)/(74-83) 133/78 (05/07 0508) SpO2:  [96 %-98 %] 98 % (05/07 0508) Weight:  [74.2 kg] 74.2 kg (05/07 0508) Last BM Date : 02/05/24  Intake/Output from previous day: 05/06 0701 - 05/07 0700 In: 1616.8 [P.O.:960; I.V.:456.8; IV Piggyback:200] Out: 5600 [Urine:4000; Drains:75; Stool:1525] Intake/Output this shift: No intake/output data recorded.  PE: Gen: awake, alert Abd: Soft, appropriately tender. IR drain still old bloody appearance, surgical drain with serous looking fluid. midline wound with VAC in place with old bloody/serous drainage.    Lab Results:  Recent Labs    02/04/24 0423 02/05/24 0440  WBC 7.4 8.5  HGB 7.5* 7.5*  HCT 23.9* 24.0*  PLT 167 171   BMET Recent Labs    02/04/24 0500 02/05/24 0440  NA 141 138  K 4.4 4.4  CL 109 107  CO2 24 23  GLUCOSE 116* 95  BUN 43* 35*  CREATININE 2.40* 2.34*  CALCIUM  8.2* 8.2*   PT/INR Recent Labs    02/04/24 0423 02/05/24 0440  LABPROT 15.4* 16.0*  INR 1.2 1.3*   CMP     Component Value Date/Time   NA 138 02/05/2024 0440   NA 141 01/22/2020 1137   K 4.4 02/05/2024 0440   CL 107 02/05/2024 0440   CO2 23 02/05/2024 0440   GLUCOSE 95 02/05/2024 0440   BUN 35 (H) 02/05/2024 0440   BUN 13 01/22/2020 1137   CREATININE 2.34 (H) 02/05/2024 0440   CREATININE 0.98 06/14/2017 0935   CALCIUM  8.2 (L) 02/05/2024 0440   PROT 6.7 02/03/2024 0340   PROT 7.3 01/22/2020 1137   ALBUMIN  <1.5 (L) 02/05/2024 0440   ALBUMIN  4.2 01/22/2020 1137   AST 40 02/03/2024 0340   ALT 53 (H) 02/03/2024 0340   ALKPHOS 70 02/03/2024 0340   BILITOT 0.7 02/03/2024 0340   BILITOT 0.4 01/22/2020 1137   GFRNONAA 31 (L) 02/05/2024 0440    GFRNONAA >89 09/17/2014 1253   GFRAA 111 01/22/2020 1137   GFRAA >89 09/17/2014 1253   Lipase     Component Value Date/Time   LIPASE 22 09/28/2019 1109       Studies/Results: No results found.   Anti-infectives: Anti-infectives (From admission, onward)    Start     Dose/Rate Route Frequency Ordered Stop   02/05/24 1400  DAPTOmycin (CUBICIN) 800 mg in sodium chloride  0.9 % IVPB        10 mg/kg  76.5 kg 132 mL/hr over 30 Minutes Intravenous Daily 02/05/24 0758     02/05/24 0900  Ampicillin-Sulbactam (UNASYN) 3 g in sodium chloride  0.9 % 100 mL IVPB        3 g 200 mL/hr over 30 Minutes Intravenous Every 6 hours 02/05/24 0758     02/03/24 2000  DAPTOmycin (CUBICIN) 800 mg in sodium chloride  0.9 % IVPB  Status:  Discontinued        10 mg/kg  76.5 kg 132 mL/hr over 30 Minutes Intravenous Every 48 hours 02/03/24 0801 02/05/24 0758   02/02/24 1230  Ampicillin-Sulbactam (UNASYN) 3 g in sodium chloride  0.9 % 100 mL IVPB  Status:  Discontinued        3 g 200 mL/hr over 30 Minutes  Intravenous Every 12 hours 02/02/24 1208 02/05/24 0758   02/01/24 2000  DAPTOmycin (CUBICIN) IVPB 500 mg/50mL premix  Status:  Discontinued        6 mg/kg  76.5 kg 100 mL/hr over 30 Minutes Intravenous Every 48 hours 02/01/24 1853 02/03/24 0801   01/31/24 2200  linezolid  (ZYVOX ) IVPB 600 mg  Status:  Discontinued        600 mg 300 mL/hr over 60 Minutes Intravenous Every 12 hours 01/31/24 2036 02/01/24 1853   01/28/24 1000  piperacillin -tazobactam (ZOSYN ) IVPB 2.25 g  Status:  Discontinued        2.25 g 100 mL/hr over 30 Minutes Intravenous Every 8 hours 01/28/24 0909 02/02/24 1208   01/18/24 1400  piperacillin -tazobactam (ZOSYN ) IVPB 3.375 g  Status:  Discontinued        3.375 g 12.5 mL/hr over 240 Minutes Intravenous Every 6 hours 01/18/24 1241 01/18/24 1242   01/18/24 1400  piperacillin -tazobactam (ZOSYN ) IVPB 3.375 g        3.375 g 100 mL/hr over 30 Minutes Intravenous Every 6 hours 01/18/24 1242  01/23/24 2046   01/18/24 0000  piperacillin -tazobactam (ZOSYN ) IVPB 2.25 g  Status:  Discontinued        2.25 g 100 mL/hr over 30 Minutes Intravenous Every 6 hours 01/17/24 2120 01/18/24 1241   01/12/24 0200  piperacillin -tazobactam (ZOSYN ) IVPB 3.375 g  Status:  Discontinued        3.375 g 12.5 mL/hr over 240 Minutes Intravenous Every 8 hours 01/11/24 2120 01/17/24 2120   01/11/24 1815  piperacillin -tazobactam (ZOSYN ) IVPB 3.375 g        3.375 g 100 mL/hr over 30 Minutes Intravenous  Once 01/11/24 1806 01/11/24 1921        Assessment/Plan  LAPAROTOMY, EXPLORATORY RIGHT COLECTOMY (without anastomosis) PLACEMENT OF ABTHERA WOUND VAC DRAINAGE OF INTRA-ABDOMINAL ABSCESS  4/16 EW   RE-EXPLORATION ABDOMEN, PLACEMENT OF ABTHERA WOUND VAC, SMALL BOWEL RESECTION 01/16/2024 EW   Re-exploration abdomen, small bowel resection, creation of end ileostomy, abd closure, placement wound vac 4/19 EW   -  CT scan was certainly concerning for leak given presumed extravasation of contrast noted in this fluid collection however, drain is bloody right now with no succus noted despite FLD. Will allow regular diet today and monitor.  Can start to wean TNA - IR drain placed in suspect abscess/leak 4/30-output has been bloody but not purulent or enteric.  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  This appears well scarred in for now.  VAC placed 5/6.  Will see with VAC change tomorrow 5/8 -regular diet, Ensure, off TNA     FEN - regular diet, Ensure, imodium, fiber VTE - SCDs, heparin  gtt, coumadin  ID - Completed post op abx. reinititiation of Zosyn  on 4/29, changed to zyvox  5/2, changed to unasyn/Cubicin 5/4 (per ID)   - Per TRH -  Pleural effusions Septic shock -  resolved ABL anemia - monitor and transfuse as needed, hgb stable, no active or acute bleeding Coagulopathy - resolved AKI - started CRRT 4/19,  defer to renal.  Holding HD as UOP is good and Cr improving Acute hypoxemic resp failure - extubated H/o VTE/PE/DVT - scds for now; was on coumadin    LOS: 25 days      Alexander [name]  Alexander [name] [facility] Surgery 02/05/2024, 8:11 AM Please see Amion for pager number during day hours 7:00am-4:30pm
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
## 2024-02-05 NOTE — Progress Notes (Addendum)
 Referring Physician(s): Marlin Simmonds, [name]  Supervising Physician: Myrlene Asper  Patient Status:  [facility] - In-pt  Chief Complaint:  S/p fluid collection drain placed 4/30 by Dr. Jinx Mourning  Brief History of Present Illness: Alexander [name] is a 63 y.o. male with medical history significant of Pe/DVT on coumadin , diastolic CHF.  This patient was admitted 01/11/2024 for GI bleeding.   4/16- He underwent ex lap where he was found to have perforated ascending colon with intra abd abscess. Right colectomy without anastomosis, placement of wound VAC, and drainage of intra-abdominal abscesses. Returned to ICU on vent with cardiorespiratory arrest same day.  ICU care included blood products and pressors. 4/19- he returned to the OR for ex lap with ileostomy creation, small bowel resection, wound VAC. Was started on CRRT. 4/23- extubated, off pressors 4/25- transferred to [facility] for HD 4/26- IR consulted for 4/25 CT showing fluid collection adjacent to drain, IR recommended monitoring status and re-imaging if not improved 4/29- Noted lethargy and confusion overnight with persistent leukocytosis. CCS consulted, zosyn  restarted and repeat CT ordered. R IJ CV and right femoral HD removed.  CT resulted today with new 10.9 x 15.8 x 13.1 cm intra-abd fluid collection that appears to contain enteric contrast and ingested material. IR consulted for fluid collection drain and triple lumen CVC placement. Patient allowed line placement by Dr. Mabel Savage but refused drain placement 4/30- After speaking with son, patient agreed to drain placement and HD cath which was completed by Dr. Jinx Mourning with dark, bloody output at that time. 5/1- Overnight, copious output from drain (960cc), no longer tachycardic (80s), WBC tending down (19.7>15.3> 12.6 today) 5/6- wound vac placed to midline dehisced abd wound  5/7- pt remains in ICU, followed by CCS, wound vac remains, off TNA with regular diet  Subjective:  Pt lying in bed with  lights off at 0900 at time of bedside exam. Alert, oriented at time of exam. Expresses disinterest in speaking with this NP further about ROS etc. Eager to continue mobilizing and working towards DC.   Allergies: Patient has no known allergies.  Medications: Prior to Admission medications   Medication Sig Start Date End Date Taking? Authorizing Provider  acetaminophen  (TYLENOL ) 325 MG tablet Take 2 tablets (650 mg total) by mouth every 6 (six) hours as needed for mild pain (pain score 1-3) (or Fever >/= 101). 07/23/23  Yes Sheikh, Omair Latif, DO  warfarin (COUMADIN ) 5 MG tablet TAKE 2 TABLETS BY MOUTH DAILY OR AS DIRECTED BY ANTICOAGULATION CLINIC Patient taking differently: Take 10 mg by mouth daily at 4 PM. 12/24/23  Yes Alexander Iba, PA  mupirocin  ointment (BACTROBAN ) 2 % Apply to affected area 1-2 times daily Patient not taking: Reported on 01/11/2024 12/03/23   Worley, Samantha, PA  polyethylene glycol powder (GLYCOLAX /MIRALAX ) 17 GM/SCOOP powder Mix as directed and take 17 g by mouth daily. Patient not taking: Reported on 01/11/2024 07/23/23   Aura Leeds Latif, DO     Vital Signs: BP (!) 142/78 (BP Location: Left Arm)   Pulse 88   Temp 98 F (36.7 C) (Oral)   Resp 20   Ht 5\' 9"  (1.753 m)   Wt 163 lb 9.3 oz (74.2 kg)   SpO2 96%   BMI 24.16 kg/m   Physical Exam HENT:     Mouth/Throat:     Mouth: Mucous membranes are moist.     Pharynx: Oropharynx is clear.  Pulmonary:     Effort: Pulmonary effort is normal.  Abdominal:  General: There is no distension.     Palpations: Abdomen is soft.     Comments: Appropriately tender. Midline wound vac. L abd surgical drain present. R midline IR drain present and attached to appropriately charged bulb with sanguinous output present. Insertion site unremarkable. No dressing, appears that dressing displaced to ensure appropriate sealing of wound vac to which the drain is in close proximity. Ostomy present   Skin:    General: Skin  is warm and dry.  Neurological:     Mental Status: He is alert.  Psychiatric:     Comments: irritable     Imaging: DG CHEST PORT 1 VIEW Result Date: 02/01/2024 CLINICAL DATA:  Check central line placement EXAM: PORTABLE CHEST 1 VIEW COMPARISON:  01/15/2024 FINDINGS: Cardiac shadow is mildly prominent but accentuated by the portable technique. Right jugular central line is noted in satisfactory position. Additionally a temporary dialysis catheter on the right is noted with the tip in the right atrium. No pneumothorax is seen. The overall inspiratory effort is poor with some basilar scarring particularly on the left. Old rib fractures are noted on the right and stable. IMPRESSION: No pneumothorax following central line placement. Stable left basilar scarring. Electronically Signed   By: Violeta Grey M.D.   On: 02/01/2024 19:36    Labs:  CBC: Recent Labs    02/02/24 0520 02/03/24 0340 02/04/24 0423 02/05/24 0440  WBC 8.4 7.0 7.4 8.5  HGB 7.7* 7.4* 7.5* 7.5*  HCT 24.2* 23.7* 23.9* 24.0*  PLT 193 174 167 171    COAGS: Recent Labs    01/20/24 0427 01/21/24 0504 01/22/24 0417 01/23/24 0505 01/24/24 0552 01/27/24 0045 02/03/24 1005 02/04/24 0423 02/05/24 0440  INR 1.0 1.0  --   --    < > 1.2 1.1 1.2 1.3*  APTT 33 34  34 33 46*  --   --   --   --   --    < > = values in this interval not displayed.    BMP: Recent Labs    02/03/24 0340 02/04/24 0423 02/04/24 0500 02/05/24 0440  NA 141  138 140 141 138  K 4.2  4.1 4.4 4.4 4.4  CL 110  108 110 109 107  CO2 21*  21* 23 24 23   GLUCOSE 123*  125* 109* 116* 95  BUN 54*  54* 44* 43* 35*  CALCIUM  8.1*  7.9* 8.1* 8.2* 8.2*  CREATININE 3.15*  3.05* 2.41* 2.40* 2.34*  GFRNONAA 22*  22* 30* 30* 31*    LIVER FUNCTION TESTS: Recent Labs    01/24/24 0552 01/25/24 0540 01/28/24 1653 01/30/24 0445 01/31/24 0706 02/03/24 0340 02/04/24 0423 02/04/24 0500 02/05/24 0440  BILITOT 1.8*  --  1.5* 1.4*  --  0.7  --    --   --   AST 41  --  33 39  --  40  --   --   --   ALT 118*  --  49* 44  --  53*  --   --   --   ALKPHOS 63  --  65 56  --  70  --   --   --   PROT 5.4*  --  5.8* 5.9*  --  6.7  --   --   --   ALBUMIN  1.5*  1.6*   < > <1.5* <1.5*   < > <1.5*  <1.5* <1.5* <1.5* <1.5*   < > = values in this interval not displayed.  Assessment and Plan:  Drain Location: R midline Size: Fr size: 10 Fr Date of placement: 01/29/24  Currently to: Drain collection device: suction bulb 24 hour output:  Output by Drain (mL) 02/03/24 0701 - 02/03/24 1900 02/03/24 1901 - 02/04/24 0700 02/04/24 0701 - 02/04/24 1900 02/04/24 1901 - 02/05/24 0700 02/05/24 0701 - 02/05/24 1421  Closed System Drain LLQ Bulb (JP) 19 Fr. 10 5 0 5   Closed System Drain 2 Midline Abdomen Bulb (JP) 10 Fr. 35 25 30 40     Current examination: Flushes/aspirates easily.  Insertion site unremarkable. Suture in place. Bulb appropriately charged. WBC, Hgb stable. VSS.  Output continues to be >10cc/24 hr.   Plan: Continue TID flushes with 5 cc NS. Record output Q shift. Dressing changes QD or PRN if soiled.  Call IR APP or on call IR MD if difficulty flushing or sudden change in drain output.  Repeat imaging/possible drain injection once output < 10 mL/QD (excluding flush material). Consideration for drain removal if output is < 10 mL/QD (excluding flush material), pending discussion with the providing surgical service. 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  Currently on Unasyn and Cubicin, per ID.  Discharge planning: Please contact IR APP or on call IR MD prior to patient d/c to ensure appropriate follow up plans are in place. Typically patient will follow up with IR clinic 10-14 days post d/c for repeat imaging/possible drain injection. IR scheduler will contact patient with date/time of appointment. Patient will need to flush drain QD with 5 cc NS, record output QD, dressing changes every 2-3 days or earlier if  soiled.   IR will continue to follow - please call with questions or concerns.     Electronically Signed: Terressa Fess, NP 02/05/2024, 2:21 PM   I spent a total of 25 Minutes at the the patient's bedside AND on the patient's hospital floor or unit, greater than 50% of which was counseling/coordinating care for s/p image guided abd fluid collection drain placement 4/30

## 2024-02-05 NOTE — Progress Notes (Signed)
 PHARMACY NOTE:  ANTIMICROBIAL RENAL DOSAGE ADJUSTMENT  Current antimicrobial regimen includes a mismatch between antimicrobial dosage and estimated renal function.  As per policy approved by the Pharmacy & Therapeutics and Medical Executive Committees, the antimicrobial dosage will be adjusted accordingly.  Current antimicrobial dosage:  Daptomycin 800 mg IV every 48 hours Unasyn 3 gm IV every  12 hours    Indication: Intra-abdominal abscess  Renal Function:  Estimated Creatinine Clearance: 33.2 mL/min (A) (by C-G formula based on SCr of 2.34 mg/dL (H)). []      On intermittent HD, scheduled: []      On CRRT    Antimicrobial dosage has been changed to:  Daptomycin 800 mg IV Q 24 hours Unasyn 3 gm IV Q 6 hours   Additional comments:   Thank you for allowing pharmacy to be a part of this patient's care.  Denson Flake, PharmD, BCPS, BCIDP Infectious Diseases Clinical Pharmacist Phone: [phone] 02/05/2024 7:58 AM

## 2024-02-05 NOTE — TOC Progression Note (Addendum)
 Transition of Care [facility]) - Progression Note    Patient Details  Name: Alexander [name] MRN: [id number] Date of Birth: [birth year]  Transition of Care [facility] CM/SW Contact  Jeani Mill, RN Phone Number: 02/05/2024, 10:25 AM  Clinical Narrative:    Selby Dage chose to son, Lovella Rubin, and sister, Adell Hones. Family chose [facility].  Notified Ciera with [facility] of choice.  [facility] won't start auth until Surgery clears for  transfer.  1608 Ciera with [facility] stated [facility] wound care RN doesn't feel comfortable doing the complex wound vac dressing. This RNCM spoke to DJ with [facility] requesting that he check with [facility] wound care nurse to see is they are comfortable with dressing change. DJ will contact RNCM covering 2C in the morning.  Expected Discharge Plan: Long Term Acute Care (LTAC) Barriers to Discharge: Continued Medical Work up  Expected Discharge Plan and Services   Discharge Planning Services: CM Consult   Living arrangements for the past 2 months: Single Family Home                                       Social Determinants of Health (SDOH) Interventions SDOH Screenings   Food Insecurity: No Food Insecurity (01/12/2024)  Housing: Low Risk  (01/12/2024)  Transportation Needs: No Transportation Needs (01/12/2024)  Utilities: Not At Risk (01/12/2024)  Depression (PHQ2-9): Low Risk  (07/24/2023)  Tobacco Use: Medium Risk (01/15/2024)    Readmission Risk Interventions    01/24/2024   10:43 AM 01/13/2024    9:13 AM 07/11/2023    2:11 PM  Readmission Risk Prevention Plan  Post Dischage Appt   Complete  Medication Screening   Complete  Transportation Screening Complete Complete Complete  PCP or Specialist Appt within 3-5 Days Complete Complete   HRI or Home Care Consult Complete Complete   Social Work Consult for Recovery Care Planning/Counseling Complete Complete   Palliative Care Screening Not Applicable Not Applicable   Medication Review Oceanographer) Complete  Complete

## 2024-02-05 NOTE — Plan of Care (Signed)
  Problem: Education: Goal: Knowledge of General Education information will improve Description: Including pain rating scale, medication(s)/side effects and non-pharmacologic comfort measures Outcome: Progressing   Problem: Health Behavior/Discharge Planning: Goal: Ability to manage health-related needs will improve Outcome: Progressing   Problem: Clinical Measurements: Goal: Ability to maintain clinical measurements within normal limits will improve Outcome: Progressing Goal: Will remain free from infection Outcome: Progressing Goal: Diagnostic test results will improve Outcome: Progressing Goal: Respiratory complications will improve Outcome: Progressing Goal: Cardiovascular complication will be avoided Outcome: Progressing   Problem: Nutrition: Goal: Adequate nutrition will be maintained Outcome: Progressing   Problem: Coping: Goal: Level of anxiety will decrease Outcome: Progressing   Problem: Elimination: Goal: Will not experience complications related to bowel motility Outcome: Progressing Goal: Will not experience complications related to urinary retention Outcome: Progressing   Problem: Pain Managment: Goal: General experience of comfort will improve and/or be controlled Outcome: Progressing   Problem: Safety: Goal: Ability to remain free from injury will improve Outcome: Progressing   Problem: Skin Integrity: Goal: Risk for impaired skin integrity will decrease Outcome: Progressing   Problem: Activity: Goal: Ability to tolerate increased activity will improve Outcome: Progressing   Problem: Respiratory: Goal: Ability to maintain a clear airway and adequate ventilation will improve Outcome: Progressing   Problem: Role Relationship: Goal: Method of communication will improve Outcome: Progressing   Problem: Education: Goal: Ability to describe self-care measures that may prevent or decrease complications (Diabetes Survival Skills Education) will  improve Outcome: Progressing Goal: Individualized Educational Video(s) Outcome: Progressing   Problem: Coping: Goal: Ability to adjust to condition or change in health will improve Outcome: Progressing   Problem: Fluid Volume: Goal: Ability to maintain a balanced intake and output will improve Outcome: Progressing   Problem: Health Behavior/Discharge Planning: Goal: Ability to identify and utilize available resources and services will improve Outcome: Progressing Goal: Ability to manage health-related needs will improve Outcome: Progressing   Problem: Metabolic: Goal: Ability to maintain appropriate glucose levels will improve Outcome: Progressing   Problem: Nutritional: Goal: Maintenance of adequate nutrition will improve Outcome: Progressing Goal: Progress toward achieving an optimal weight will improve Outcome: Progressing   Problem: Skin Integrity: Goal: Risk for impaired skin integrity will decrease Outcome: Progressing   Problem: Tissue Perfusion: Goal: Adequacy of tissue perfusion will improve Outcome: Progressing

## 2024-02-05 NOTE — Progress Notes (Signed)
 Physical Therapy Treatment Patient Details Name: Alexander [name] MRN: [id number] DOB: 11/09/1960 Today's Date: 02/05/2024   History of Present Illness Alexander [name] is a 63 y.o. male admitted to [facility] on 01/11/2024 with rectal bleeding and hypotension, now S/P exploratory laparotomy where he was found to have a perforated ascending colon with intra-abdominal abscess, right colectomy without anastomosis, placement of wound VAC, and drainage of intra-abdominal abscesses. CRRT started 4/19- 01/23/24 .   PMH: PE/DVT on Coumadin , GERD, R ankle arthroplasty    PT Comments  Pt resting in bed on arrival and agreeable to session with encouragement. Pt able to come to sitting EOB with min A with continued cues for log roll technique as pt with poor carryover from previous session education. Pt able to come to stand x3 during session with min A to boost to stand due to decreased LE strength and perform brief standing LE exercise. Pt continues to be limited by decreased activity tolerance and poor core strength. Pt continues to benefit from skilled PT services to progress toward functional mobility goals.      If plan is discharge home, recommend the following: Two people to help with walking and/or transfers;Two people to help with bathing/dressing/bathroom   Can travel by private vehicle        Equipment Recommendations   (TBA)    Recommendations for Other Services       Precautions / Restrictions Precautions Precautions: Fall;Other (comment) Recall of Precautions/Restrictions: Impaired Precaution/Restrictions Comments: abdominal incision dehisced, wound vac, x2 bulb drains Restrictions Weight Bearing Restrictions Per Provider Order: No     Mobility  Bed Mobility Overal bed mobility: Needs Assistance Bed Mobility: Rolling, Sidelying to Sit, Supine to Sit Rolling: Min assist Sidelying to sit: Min assist Supine to sit: Min assist     General bed mobility comments: minA for cues to log roll and  to elevate trunk to EOB    Transfers Overall transfer level: Needs assistance Equipment used: Ambulation equipment used Transfers: Sit to/from Stand Sit to Stand: Contact guard assist, Min assist           General transfer comment: sit<>stand in stedy frame pt needing minA to boost to stand from EOB and, CGA to stand from stedy seat x2 Transfer via Lift Equipment: Stedy  Ambulation/Gait               General Gait Details: unable   Stairs             Wheelchair Mobility     Tilt Bed    Modified Rankin (Stroke Patients Only)       Balance Overall balance assessment: Needs assistance Sitting-balance support: Feet supported, Bilateral upper extremity supported, Single extremity supported Sitting balance-Leahy Scale: Fair     Standing balance support: Bilateral upper extremity supported, Reliant on assistive device for balance Standing balance-Leahy Scale: Poor Standing balance comment: relies on UEs in stedy                            Communication Communication Communication: Impaired Factors Affecting Communication: Reduced clarity of speech  Cognition Arousal: Alert Behavior During Therapy: WFL for tasks assessed/performed   PT - Cognitive impairments: Attention, Initiation, Sequencing, Orientation   Orientation impairments: Time                     Following commands: Impaired Following commands impaired: Follows one step commands with increased time    Cueing Cueing Techniques: Verbal  cues, Tactile cues, Visual cues  Exercises Other Exercises Other Exercises: x2 sit to stand from stedy Other Exercises: standing marching in stedy frame x20    General Comments General comments (skin integrity, edema, etc.): VSS on RA      Pertinent Vitals/Pain Pain Assessment Pain Assessment: No/denies pain Pain Intervention(s): Monitored during session    Home Living                          Prior Function             PT Goals (current goals can now be found in the care plan section) Acute Rehab PT Goals PT Goal Formulation: With patient Time For Goal Achievement: 02/06/24 Progress towards PT goals: Progressing toward goals    Frequency    Min 2X/week      PT Plan      Co-evaluation              AM-PAC PT "6 Clicks" Mobility   Outcome Measure  Help needed turning from your back to your side while in a flat bed without using bedrails?: A Lot Help needed moving from lying on your back to sitting on the side of a flat bed without using bedrails?: A Lot Help needed moving to and from a bed to a chair (including a wheelchair)?: Total Help needed standing up from a chair using your arms (e.g., wheelchair or bedside chair)?: Total Help needed to walk in hospital room?: Total Help needed climbing 3-5 steps with a railing? : Total 6 Click Score: 8    End of Session   Activity Tolerance: Patient tolerated treatment well Patient left: in bed;with call bell/phone within reach;with nursing/sitter in room Nurse Communication: Mobility status;Need for lift equipment PT Visit Diagnosis: Unsteadiness on feet (R26.81);Muscle weakness (generalized) (M62.81);Other symptoms and signs involving the nervous system (R29.898);Difficulty in walking, not elsewhere classified (R26.2)     Time: 1610-9604 PT Time Calculation (min) (ACUTE ONLY): 25 min  Charges:    $Therapeutic Exercise: 8-22 mins $Therapeutic Activity: 8-22 mins PT General Charges $$ ACUTE PT VISIT: 1 Visit                     Alexander [name] R. PTA Acute Rehabilitation Services Office: [phone]   Agapito Horseman 02/05/2024, 4:00 PM

## 2024-02-06 DIAGNOSIS — K631 Perforation of intestine (nontraumatic): Secondary | ICD-10-CM | POA: Diagnosis not present

## 2024-02-06 DIAGNOSIS — N179 Acute kidney failure, unspecified: Secondary | ICD-10-CM | POA: Diagnosis not present

## 2024-02-06 LAB — HEPARIN LEVEL (UNFRACTIONATED)
Heparin Unfractionated: 0.52 [IU]/mL (ref 0.30–0.70)
Heparin Unfractionated: 0.35 [IU]/mL (ref 0.30–0.70)

## 2024-02-06 LAB — CBC
HCT: 23.8 % — ABNORMAL LOW (ref 39.0–52.0)
Hemoglobin: 7.6 g/dL — ABNORMAL LOW (ref 13.0–17.0)
MCH: 29.8 pg (ref 26.0–34.0)
MCHC: 31.9 g/dL (ref 30.0–36.0)
MCV: 93.3 fL (ref 80.0–100.0)
Platelets: 214 10*3/uL (ref 150–400)
RBC: 2.55 MIL/uL — ABNORMAL LOW (ref 4.22–5.81)
RDW: 17.2 % — ABNORMAL HIGH (ref 11.5–15.5)
WBC: 10.4 10*3/uL (ref 4.0–10.5)
nRBC: 0 % (ref 0.0–0.2)

## 2024-02-06 LAB — COMPREHENSIVE METABOLIC PANEL WITH GFR
ALT: 41 U/L (ref 0–44)
AST: 33 U/L (ref 15–41)
Albumin: <1.5 g/dL — ABNORMAL LOW (ref 3.5–5.0)
Alkaline Phosphatase: 72 U/L (ref 38–126)
Anion gap: 8 (ref 5–15)
BUN: 25 mg/dL — ABNORMAL HIGH (ref 8–23)
CO2: 21 mmol/L — ABNORMAL LOW (ref 22–32)
Calcium: 8.1 mg/dL — ABNORMAL LOW (ref 8.9–10.3)
Chloride: 108 mmol/L (ref 98–111)
Creatinine, Ser: 2.15 mg/dL — ABNORMAL HIGH (ref 0.61–1.24)
GFR, Estimated: 34 mL/min — ABNORMAL LOW (ref >60)
Glucose, Bld: 113 mg/dL — ABNORMAL HIGH (ref 70–99)
Potassium: 4.3 mmol/L (ref 3.5–5.1)
Sodium: 137 mmol/L (ref 135–145)
Total Bilirubin: 0.6 mg/dL (ref 0.0–1.2)
Total Protein: 6.4 g/dL — ABNORMAL LOW (ref 6.5–8.1)

## 2024-02-06 LAB — MAGNESIUM: Magnesium: 1.8 mg/dL (ref 1.7–2.4)

## 2024-02-06 LAB — PROTIME-INR
INR: 1.5 — ABNORMAL HIGH (ref 0.8–1.2)
Prothrombin Time: 18.6 s — ABNORMAL HIGH (ref 11.4–15.2)

## 2024-02-06 LAB — PHOSPHORUS: Phosphorus: 4.5 mg/dL (ref 2.5–4.6)

## 2024-02-06 MED ORDER — WARFARIN SODIUM 10 MG PO TABS
10.0000 mg | ORAL_TABLET | Freq: Once | ORAL | Status: AC
  Administered 2024-02-06: 10 mg via ORAL
  Filled 2024-02-06: qty 1

## 2024-02-06 MED ORDER — AMOXICILLIN-POT CLAVULANATE 875-125 MG PO TABS
1.0000 | ORAL_TABLET | Freq: Two times a day (BID) | ORAL | Status: DC
Start: 2024-02-06 — End: 2024-02-11
  Administered 2024-02-06 – 2024-02-10 (×8): 1 via ORAL
  Filled 2024-02-06 (×8): qty 1
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
## 2024-02-06 NOTE — Progress Notes (Signed)
 [facility]  Date of Admission:  01/11/2024    Principal Problem:   Spontaneous perforation of colon (HCC) Active Problems:   History of pulmonary embolus (PE)   Essential hypertension, benign   AKI (acute kidney injury) (HCC)   Iron  deficiency anemia   Acute blood loss anemia   Hypotension due to hypovolemia          Assessment: 63 year old male with PE on Coumadin , heart failure with preserved ejection fraction, PAD, diverticulitis, CAD blood per rectum found to have colon perforation requiring multiple surgeries.  He underwent IR guided drain placement on 4/30 of a intra-abdominal abscess.  ID engaged #Postop intra-abdominal abscess status post drain.  Unclear source growing VRE #TPN-attempting to wean - Initially underwent ex lap on 4/15 for intra-abdominal abscess - Reex lap with wound VAC placement/17 Show/19 ileostomy collection - CT on 4/29 showed 10.9 X15.8X 13.1 cm large thick-walled collection in pelvis.  He underwent CT-guided drain placement with 100 mL serosanguineous fluid put collected.  Cultures grew VRE Recommendations:  -Continue daptomycin  -Pt is able to tolerate PO as such switch unasyn to augmentin  - Looks like disposition is to LTAC [facility] sometime this week.  Plan is for patient to follow-up with interventional radiology 2 weeks for drain management with IR.  Will continue IV antibiotics with daptomycin, continue p.o. Augmentin  this point.  Recommend imaging at  follow-up appointment to assess -Patient has central line.  #AKI -initially required crrt -Nephrology following   Evaluation of this patient requires complex antimicrobial therapy evaluation and counseling + isolation needs for disease transmission risk assessment and mitigation   Microbiology:   Antibiotics: Pip-tazo 4/12-4/24, 4/29-present Linezolid  5/2-5/3 Daptomycin5/5-  Other  SUBJECTIVE: Resting in bed, eating some spghetti Interval:  afebrile overngiht  Review of Systems: Review of Systems  All other systems reviewed and are negative.    Scheduled Meds:  Chlorhexidine  Gluconate Cloth  6 each Topical Daily   diclofenac  Sodium  2 g Topical QID   feeding supplement  237 mL Oral BID BM   ferrous sulfate  325 mg Oral Q breakfast   loperamide  4 mg Oral QID   multivitamin with minerals  1 tablet Oral Daily   mouth rinse  15 mL Mouth Rinse 4 times per day   pantoprazole   40 mg Oral BID   polycarbophil  1,250 mg Oral BID   sodium chloride  flush  5 mL Intracatheter Q8H   sucralfate   1 g Oral BID   warfarin  10 mg Oral ONCE-1600   Warfarin - Pharmacist Dosing Inpatient   Does not apply q1600   Continuous Infusions:  sodium chloride  Stopped (01/24/24 2213)   ampicillin-sulbactam (UNASYN) IV 3 g (02/06/24 0844)   chlorproMAZINE  (THORAZINE ) 12.5 mg in sodium chloride  0.9 % 25 mL IVPB     DAPTOmycin 800 mg (02/05/24 1534)   heparin  2,000 Units/hr (02/06/24 0842)   PRN Meds:.sodium chloride , acetaminophen , alum & mag hydroxide-simeth, chlorproMAZINE  (THORAZINE ) 12.5 mg in sodium chloride  0.9 % 25 mL IVPB, HYDROmorphone  (DILAUDID ) injection **OR** HYDROmorphone  (DILAUDID ) injection, ipratropium-albuterol , lip balm, LORazepam , methocarbamol  (ROBAXIN ) injection, ondansetron  (ZOFRAN ) IV, mouth rinse, oxyCODONE  No Known Allergies  OBJECTIVE: Vitals:   02/05/24 1116 02/05/24 1940 02/06/24 0400 02/06/24 0746  BP: (!) 142/78 132/79 120/73 136/73  Pulse:  87 84 80  Resp: 20 16 16 18   Temp: 98 F (36.7 C) 97.9 F (36.6 C) 98 F (36.7 C) 98 F (36.7 C)  TempSrc: Oral     SpO2: 96% 100% 98% 97%  Weight:      Height:       Body mass index is 24.16 kg/m.  Physical Exam Constitutional:      General: He is not in acute distress.    Appearance: He is normal weight. He is not toxic-appearing.  HENT:     Head: Normocephalic and atraumatic.     Right Ear: External ear normal.     Left Ear: External ear normal.     Nose:  No congestion or rhinorrhea.     Mouth/Throat:     Mouth: Mucous membranes are moist.     Pharynx: Oropharynx is clear.  Eyes:     Extraocular Movements: Extraocular movements intact.     Conjunctiva/sclera: Conjunctivae normal.     Pupils: Pupils are equal, round, and reactive to light.  Cardiovascular:     Rate and Rhythm: Normal rate and regular rhythm.     Heart sounds: No murmur heard.    No friction rub. No gallop.  Pulmonary:     Effort: Pulmonary effort is normal.     Breath sounds: Normal breath sounds.  Abdominal:     Comments: Abdominal wound  Musculoskeletal:        General: No swelling. Normal range of motion.     Cervical back: Normal range of motion and neck supple.  Skin:    General: Skin is warm and dry.  Neurological:     General: No focal deficit present.     Mental Status: He is oriented to person, place, and time.  Psychiatric:        Mood and Affect: Mood normal.       Lab Results Lab Results  Component Value Date   WBC 10.4 02/06/2024   HGB 7.6 (L) 02/06/2024   HCT 23.8 (L) 02/06/2024   MCV 93.3 02/06/2024   PLT 214 02/06/2024    Lab Results  Component Value Date   CREATININE 2.15 (H) 02/06/2024   BUN 25 (H) 02/06/2024   NA 137 02/06/2024   K 4.3 02/06/2024   CL 108 02/06/2024   CO2 21 (L) 02/06/2024    Lab Results  Component Value Date   ALT 41 02/06/2024   AST 33 02/06/2024   ALKPHOS 72 02/06/2024   BILITOT 0.6 02/06/2024        Orlie Bjornstad, MD [facility] 02/06/2024, 3:15 PM

## 2024-02-06 NOTE — Progress Notes (Signed)
 Brief Progress Note:  Brief HPI: Alexander [name] is a 63 y.o. male with medical history significant of Pe/DVT on coumadin , diastolic CHF. This patient was admitted 01/11/2024 for GI bleeding.He underwent ex lap where he was found to have perforated ascending colon with intra abd abscess on 4/16. He returned to the OR 4/19 for ex lap with ileostomy creation, small bowel resection, wound VAC. On 4/30 IR became involved with care 4/30, consulted for abd fluid collection drain which was placed by Dr. Jinx Mourning 4/30.   Spoke with Marlin Simmonds, PA with [facility]. Patient will likely be transported to [facility] this week. [facility] recommends continued plan for patient to follow up with IR at [facility] for drain checks rather than the patient's drain being potentially lost to follow up at [facility].   Overnight, patient drain output was 30mL.  No imaging at this time.  Order placed for patient to be scheduled for drain check at 2 weeks post procedure, pt will require transport for this. In meantime, team will continue to round while admitted at [facility]. At [facility], drain should continue to be routinely flushed, output measured, frequent checks to ensure charged, and dressing changes every 2-3 days or PRN.    Call IR APP or on call IR MD if difficulty flushing or sudden change in drain output.  Repeat imaging/possible drain injection once output < 10 mL/QD (excluding flush material). Consideration for drain removal if output is < 10 mL/QD (excluding flush material), pending discussion with the providing surgical service.

## 2024-02-06 NOTE — Progress Notes (Signed)
 Progress Note  19 Days Post-Op  Subjective: No complaints.  Eating well. Ostomy working well.  Ostomy output down to 1200cc in 24 hrs  Objective: Vital signs in last 24 hours: Temp:  [97.9 F (36.6 C)-98 F (36.7 C)] 98 F (36.7 C) (05/08 0746) Pulse Rate:  [80-87] 80 (05/08 0746) Resp:  [16-20] 18 (05/08 0746) BP: (120-142)/(73-79) 136/73 (05/08 0746) SpO2:  [96 %-100 %] 97 % (05/08 0746) Last BM Date : 02/05/24  Intake/Output from previous day: 05/07 0701 - 05/08 0700 In: 733.7 [P.O.:240; I.V.:74.1; IV Piggyback:414.6] Out: 4355 [Urine:3100; Drains:55; Stool:1200] Intake/Output this shift: Total I/O In: -  Out: 225 [Stool:225]  PE: Gen: awake, alert Abd: Soft, appropriately tender. IR drain still old bloody appearance, surgical drain with serous looking fluid and removed today with no issues. midline wound much improved.  No longer soupy and draining.  Good superficial granulation and good granulation starting to form over bowel   Lab Results:  Recent Labs    02/05/24 0440 02/06/24 0557  WBC 8.5 10.4  HGB 7.5* 7.6*  HCT 24.0* 23.8*  PLT 171 214   BMET Recent Labs    02/05/24 0440 02/06/24 0557  NA 138 137  K 4.4 4.3  CL 107 108  CO2 23 21*  GLUCOSE 95 113*  BUN 35* 25*  CREATININE 2.34* 2.15*  CALCIUM  8.2* 8.1*   PT/INR Recent Labs    02/05/24 0440 02/06/24 0557  LABPROT 16.0* 18.6*  INR 1.3* 1.5*   CMP     Component Value Date/Time   NA 137 02/06/2024 0557   NA 141 01/22/2020 1137   K 4.3 02/06/2024 0557   CL 108 02/06/2024 0557   CO2 21 (L) 02/06/2024 0557   GLUCOSE 113 (H) 02/06/2024 0557   BUN 25 (H) 02/06/2024 0557   BUN 13 01/22/2020 1137   CREATININE 2.15 (H) 02/06/2024 0557   CREATININE 0.98 06/14/2017 0935   CALCIUM  8.1 (L) 02/06/2024 0557   PROT 6.4 (L) 02/06/2024 0557   PROT 7.3 01/22/2020 1137   ALBUMIN  <1.5 (L) 02/06/2024 0557   ALBUMIN  4.2 01/22/2020 1137   AST 33 02/06/2024 0557   ALT 41 02/06/2024 0557   ALKPHOS  72 02/06/2024 0557   BILITOT 0.6 02/06/2024 0557   BILITOT 0.4 01/22/2020 1137   GFRNONAA 34 (L) 02/06/2024 0557   GFRNONAA >89 09/17/2014 1253   GFRAA 111 01/22/2020 1137   GFRAA >89 09/17/2014 1253   Lipase     Component Value Date/Time   LIPASE 22 09/28/2019 1109       Studies/Results: No results found.   Anti-infectives: Anti-infectives (From admission, onward)    Start     Dose/Rate Route Frequency Ordered Stop   02/05/24 1400  DAPTOmycin (CUBICIN) 800 mg in sodium chloride  0.9 % IVPB        10 mg/kg  76.5 kg 132 mL/hr over 30 Minutes Intravenous Daily 02/05/24 0758     02/05/24 0900  Ampicillin-Sulbactam (UNASYN) 3 g in sodium chloride  0.9 % 100 mL IVPB        3 g 200 mL/hr over 30 Minutes Intravenous Every 6 hours 02/05/24 0758     02/03/24 2000  DAPTOmycin (CUBICIN) 800 mg in sodium chloride  0.9 % IVPB  Status:  Discontinued        10 mg/kg  76.5 kg 132 mL/hr over 30 Minutes Intravenous Every 48 hours 02/03/24 0801 02/05/24 0758   02/02/24 1230  Ampicillin-Sulbactam (UNASYN) 3 g in sodium chloride  0.9 %  100 mL IVPB  Status:  Discontinued        3 g 200 mL/hr over 30 Minutes Intravenous Every 12 hours 02/02/24 1208 02/05/24 0758   02/01/24 2000  DAPTOmycin (CUBICIN) IVPB 500 mg/50mL premix  Status:  Discontinued        6 mg/kg  76.5 kg 100 mL/hr over 30 Minutes Intravenous Every 48 hours 02/01/24 1853 02/03/24 0801   01/31/24 2200  linezolid  (ZYVOX ) IVPB 600 mg  Status:  Discontinued        600 mg 300 mL/hr over 60 Minutes Intravenous Every 12 hours 01/31/24 2036 02/01/24 1853   01/28/24 1000  piperacillin -tazobactam (ZOSYN ) IVPB 2.25 g  Status:  Discontinued        2.25 g 100 mL/hr over 30 Minutes Intravenous Every 8 hours 01/28/24 0909 02/02/24 1208   01/18/24 1400  piperacillin -tazobactam (ZOSYN ) IVPB 3.375 g  Status:  Discontinued        3.375 g 12.5 mL/hr over 240 Minutes Intravenous Every 6 hours 01/18/24 1241 01/18/24 1242   01/18/24 1400   piperacillin -tazobactam (ZOSYN ) IVPB 3.375 g        3.375 g 100 mL/hr over 30 Minutes Intravenous Every 6 hours 01/18/24 1242 01/23/24 2046   01/18/24 0000  piperacillin -tazobactam (ZOSYN ) IVPB 2.25 g  Status:  Discontinued        2.25 g 100 mL/hr over 30 Minutes Intravenous Every 6 hours 01/17/24 2120 01/18/24 1241   01/12/24 0200  piperacillin -tazobactam (ZOSYN ) IVPB 3.375 g  Status:  Discontinued        3.375 g 12.5 mL/hr over 240 Minutes Intravenous Every 8 hours 01/11/24 2120 01/17/24 2120   01/11/24 1815  piperacillin -tazobactam (ZOSYN ) IVPB 3.375 g        3.375 g 100 mL/hr over 30 Minutes Intravenous  Once 01/11/24 1806 01/11/24 1921        Assessment/Plan  LAPAROTOMY, EXPLORATORY RIGHT COLECTOMY (without anastomosis) PLACEMENT OF ABTHERA WOUND VAC DRAINAGE OF INTRA-ABDOMINAL ABSCESS  4/16 EW   RE-EXPLORATION ABDOMEN, PLACEMENT OF ABTHERA WOUND VAC, SMALL BOWEL RESECTION 01/16/2024 EW   Re-exploration abdomen, small bowel resection, creation of end ileostomy, abd closure, placement wound vac 4/19 EW   -  CT scan was certainly concerning for leak given presumed extravasation of contrast noted in this fluid collection however, drain is bloody right now with no succus noted despite FLD. Will allow regular diet today and monitor.  Can start to wean TNA - IR drain placed in suspect abscess/leak 4/30-output has been bloody but not purulent or enteric.  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  Currently on Unasyn and Cubicin, per ID - Cont to monitor ostomy output - increased imodium and fiber, increased imodium to max dose 16mg /daily 5/7 due to output around 1.5L, down to around 1.2L - fascial dehiscence without evisceration.  This appears well scarred in for now.  VAC placed 5/6 and much improved!  Continue VAC for now.  Small pieces of white sponge to 3 little areas and black sponge on top.  This is healing well and much less soupy! -regular diet, Ensure -surgical JP  removed 5/8     FEN - regular diet, Ensure, imodium, fiber VTE - SCDs, heparin  gtt, coumadin  ID - Completed post op abx. reinititiation of Zosyn  on 4/29, changed to zyvox  5/2, changed to unasyn/Cubicin 5/4 (per ID)   - Per [facility] -  Pleural effusions Septic shock - resolved ABL anemia - monitor and transfuse as needed, hgb stable, no active or  acute bleeding Coagulopathy - resolved AKI - started CRRT 4/19,  defer to renal.  Holding HD as UOP is good and Cr improving Acute hypoxemic resp failure - extubated H/o VTE/PE/DVT - scds for now; was on coumadin    LOS: 26 days      Alexander [name], [facility] 02/06/2024, 10:48 AM Please see Amion for pager number during day hours 7:00am-4:30pm

## 2024-02-06 NOTE — Plan of Care (Signed)
  Problem: Education: Goal: Knowledge of General Education information will improve Description: Including pain rating scale, medication(s)/side effects and non-pharmacologic comfort measures Outcome: Progressing   Problem: Health Behavior/Discharge Planning: Goal: Ability to manage health-related needs will improve Outcome: Progressing   Problem: Clinical Measurements: Goal: Ability to maintain clinical measurements within normal limits will improve Outcome: Progressing Goal: Will remain free from infection Outcome: Progressing Goal: Diagnostic test results will improve Outcome: Progressing Goal: Respiratory complications will improve Outcome: Progressing Goal: Cardiovascular complication will be avoided Outcome: Progressing   Problem: Nutrition: Goal: Adequate nutrition will be maintained Outcome: Progressing   Problem: Coping: Goal: Level of anxiety will decrease Outcome: Progressing   Problem: Elimination: Goal: Will not experience complications related to bowel motility Outcome: Progressing Goal: Will not experience complications related to urinary retention Outcome: Progressing   Problem: Pain Managment: Goal: General experience of comfort will improve and/or be controlled Outcome: Progressing   Problem: Safety: Goal: Ability to remain free from injury will improve Outcome: Progressing   Problem: Skin Integrity: Goal: Risk for impaired skin integrity will decrease Outcome: Progressing   Problem: Activity: Goal: Ability to tolerate increased activity will improve Outcome: Progressing   Problem: Respiratory: Goal: Ability to maintain a clear airway and adequate ventilation will improve Outcome: Progressing   Problem: Role Relationship: Goal: Method of communication will improve Outcome: Progressing   Problem: Education: Goal: Ability to describe self-care measures that may prevent or decrease complications (Diabetes Survival Skills Education) will  improve Outcome: Progressing Goal: Individualized Educational Video(s) Outcome: Progressing   Problem: Coping: Goal: Ability to adjust to condition or change in health will improve Outcome: Progressing   Problem: Fluid Volume: Goal: Ability to maintain a balanced intake and output will improve Outcome: Progressing   Problem: Health Behavior/Discharge Planning: Goal: Ability to identify and utilize available resources and services will improve Outcome: Progressing Goal: Ability to manage health-related needs will improve Outcome: Progressing   Problem: Metabolic: Goal: Ability to maintain appropriate glucose levels will improve Outcome: Progressing   Problem: Nutritional: Goal: Maintenance of adequate nutrition will improve Outcome: Progressing Goal: Progress toward achieving an optimal weight will improve Outcome: Progressing   Problem: Skin Integrity: Goal: Risk for impaired skin integrity will decrease Outcome: Progressing   Problem: Tissue Perfusion: Goal: Adequacy of tissue perfusion will improve Outcome: Progressing

## 2024-02-06 NOTE — Progress Notes (Signed)
 PHARMACY - ANTICOAGULATION CONSULT NOTE  Pharmacy Consult for Heparin  + Warfarin Indication: History of PE/DV  No Known Allergies  Patient Measurements: Height: 5\' 9"  (175.3 cm) Weight: 80.1 kg (176 lb 9.4 oz) IBW/kg (Calculated) : 70.7 HEPARIN  DW (KG): 81 kg  Vital Signs: Temp: 98 F (36.7 C) (05/08 1715) BP: 140/78 (05/08 1715) Pulse Rate: 91 (05/08 1715)  Labs: Recent Labs    02/04/24 0423 02/04/24 0424 02/04/24 0500 02/05/24 0440 02/06/24 0557 02/06/24 1723  HGB 7.5*  --   --  7.5* 7.6*  --   HCT 23.9*  --   --  24.0* 23.8*  --   PLT 167  --   --  171 214  --   LABPROT 15.4*  --   --  16.0* 18.6*  --   INR 1.2  --   --  1.3* 1.5*  --   HEPARINUNFRC  --    < >  --  0.50 0.52 0.35  CREATININE 2.41*  --  2.40* 2.34* 2.15*  --    < > = values in this interval not displayed.    Estimated Creatinine Clearance: 36.1 mL/min (A) (by C-G formula based on SCr of 2.15 mg/dL (H)).   Medications: Warfarin PTA for history of DVT/PE - Home dose: 10 mg PO daily - INR goal: 2-3 Information obtained from anti-coag clinic note 12/24/23  Assessment: Pt is a 62 YOM who presented to the ED on 01/11/24 with rectal bleeding, hypotension.  Last blood transfusion 4/30.  Patient is eating 75-100% of meals  He has a wound vac and (2) JP drains w/ some blood loss through these but nothing additive from prior to today. There are no issues with the heparin  infusion. HL back in goal range.  Goal of Therapy:  INR goal 2-3 Heparin  level goal 0.3-0.5 Monitor platelets by anticoagulation protocol: Yes   Plan:  Warfarin 10 mg x1 given today Continue heparin  infusion at 2000 units/hr Check heparin  level daily while on heparin  Continue to monitor H&H and platelets  Thank you for allowing pharmacy to be a part of this patient's care.  Claudia Cuff, PharmD, BCPS Clinical Pharmacist

## 2024-02-06 NOTE — Progress Notes (Signed)
 Alexander [name]  ZOX:[id number] DOB: 08/19/1961 DOA: 01/11/2024 PCP: Alexander Iba, PA    Brief Narrative:  63 year old with a history of pulmonary embolism on chronic Coumadin  and diastolic CHF who presented to the hospital 4/12 with bright red blood per rectum.  Workup ultimately revealed a perforated colon.  Patient underwent an exploratory laparotomy followed by a stay in the ICU.  During his hospitalization he developed acute kidney failure requiring CRRT with subsequent improvement in renal function prior to the initiation of intermittent dialysis.  He continues on broad-spectrum antibiotics for his intra-abdominal and wound infections and is being followed by General Surgery and Infectious Disease.  Significant Events: 4/12 admit with BRBPR 4/15 repeat CT suggest contained perforation of the ascending colon -General Surgery evaluated 4/16 exploratory laparotomy -postop CODE BLUE 4/17 back to OR -small bowel resection and wound VAC placement 4/19 back to OR -exploratory laparotomy, ileostomy creation, small bowel resection, wound VAC placement -CRRT initiated 4/23 extubated -off pressors 4/25 transfer from [facility] long ICU to [facility] progressive care for possible HD 4/29 triple-lumen CVC placement for TPN 4/30 CT-guided drain placement abdominal abscess -HD catheter placement 5/2 diet advanced to clears 5/5 diet advanced to regular  Goals of Care:   Code Status: Full Code   DVT prophylaxis: SCD's Start: 01/15/24 1400 warfarin (COUMADIN ) tablet 10 mg   Interim Hx: No acute events reported overnight.  Afebrile.  Vital signs stable.  The surgical team reports that his abdominal wound is improving nicely today, and they removed his surgical drain today.  The patient is resting comfortably in bed with no complaints.  Respirations are unlabored.  He appears medically stable.  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 Has required multiple  follow-up surgeries as noted above - now status post right hemicolectomy and partial small bowel resection with ileostomy creation - wound VAC in place - now off TPN and able to support himself with oral intake alone - broad-spectrum antibiotic as per ID, with end of treatment course to be determined based upon ongoing clinical improvement  Acute kidney failure Required CRRT during initial portion of hospital stay -due to combination of septic shock leading to ATN as well as possible IV contrast injury -did not require initiation of intermittent HD - renal function is steadily improving therefore will remove temp HD catheter  Acute blood loss anemia with anemia of critical illness - iron  and B12 deficiencies Transfuse as needed to keep hemoglobin 7.0 or > - hemoglobin holding steady for now  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 Drain culture growing this organism - antibiotic per ID recommendations, with plan to continue daptomycin at LTAC  Septic shock - hypotension Required vasopressors during initial portion of hospital stay -blood pressure is now stable without vasopressor support  History of PE October 2024 Anticoagulation held initially with BRBPR - has now been transitioned back to warfarin with IV heparin  bridge  Acute hypoxic respiratory failure due to intra-abdominal catastrophe and septic shock with cardiopulmonary arrest Required short-term intubation - extubated successfully 4/23 -oxygen saturations now stable on room air  Encephalopathy - ICU induced delirium Resolved with patient now having returned to his baseline mental status  Acute right knee pain  Plain film x-ray unrevealing - uric acid normal   Family Communication: No family present at time of exam Disposition: Investigating possibility of LTAC stay -otherwise will need SNF rehab stay -complex wound care complicating disposition - appears will be medically stable for d/c to LTAC  5/9  Objective: Blood pressure 136/73, pulse 80, temperature 98 F (36.7 C), resp. rate 18, height 5\' 9"  (1.753 m), weight 74.2 kg, SpO2 97%.  Intake/Output Summary (Last 24 hours) at 02/06/2024 1043 Last data filed at 02/06/2024 0800 Gross per 24 hour  Intake 493.69 ml  Output 4580 ml  Net -4086.31 ml   Filed Weights   02/03/24 0628 02/04/24 0401 02/05/24 0508  Weight: 75.6 kg 74 kg 74.2 kg    Examination: General: No acute respiratory distress Lungs: Clear to auscultation bilaterally without wheezes or crackles Cardiovascular: Regular rate and rhythm without murmur  Abdomen: Soft, wound VAC in place, bowel sounds present peripherally, no rebound Extremities: No significant cyanosis, clubbing, or edema bilateral lower extremities  CBC: Recent Labs  Lab 02/04/24 0423 02/05/24 0440 02/06/24 0557  WBC 7.4 8.5 10.4  HGB 7.5* 7.5* 7.6*  HCT 23.9* 24.0* 23.8*  MCV 94.1 93.8 93.3  PLT 167 171 214   Basic Metabolic Panel: Recent Labs  Lab 02/03/24 0340 02/04/24 0423 02/04/24 0500 02/05/24 0440 02/06/24 0557  NA 141  138 140 141 138 137  K 4.2  4.1 4.4 4.4 4.4 4.3  CL 110  108 110 109 107 108  CO2 21*  21* 23 24 23  21*  GLUCOSE 123*  125* 109* 116* 95 113*  BUN 54*  54* 44* 43* 35* 25*  CREATININE 3.15*  3.05* 2.41* 2.40* 2.34* 2.15*  CALCIUM  8.1*  7.9* 8.1* 8.2* 8.2* 8.1*  MG 1.9 1.8  --   --  1.8  PHOS 3.2  3.3 4.1 4.5 4.2 4.5   GFR: Estimated Creatinine Clearance: 36.1 mL/min (A) (by C-G formula based on SCr of 2.15 mg/dL (H)).   Scheduled Meds:  Chlorhexidine  Gluconate Cloth  6 each Topical Daily   diclofenac  Sodium  2 g Topical QID   feeding supplement  237 mL Oral BID BM   ferrous sulfate  325 mg Oral Q breakfast   loperamide  4 mg Oral QID   multivitamin with minerals  1 tablet Oral Daily   mouth rinse  15 mL Mouth Rinse 4 times per day   pantoprazole   40 mg Oral BID   polycarbophil  1,250 mg Oral BID   sodium chloride  flush  5 mL  Intracatheter Q8H   sucralfate   1 g Oral BID   warfarin  10 mg Oral ONCE-1600   Warfarin - Pharmacist Dosing Inpatient   Does not apply q1600   Continuous Infusions:  sodium chloride  Stopped (01/24/24 2213)   ampicillin-sulbactam (UNASYN) IV 3 g (02/06/24 0844)   chlorproMAZINE  (THORAZINE ) 12.5 mg in sodium chloride  0.9 % 25 mL IVPB     DAPTOmycin 800 mg (02/05/24 1534)   heparin  2,000 Units/hr (02/06/24 0842)     LOS: 26 days   Abbe Abate, MD [facility] Office  [phone] Pager - Text Page per [place] Foley  If 7PM-7AM, please contact night-coverage per Amion 02/06/2024, 10:43 AM

## 2024-02-06 NOTE — TOC Progression Note (Signed)
 Transition of Care [facility]) - Progression Note    Patient Details  Name: Alexander [name] MRN: [id number] Date of Birth: March 03, 1961  [facility] of Care [facility] CM/SW Contact  Dane Dung, RN Phone Number: 02/06/2024, 8:54 AM  Clinical Narrative:    CM called and spoke with the patient's sister regarding possible bed offer for LTAC at [facility].  The patient's daughter was agreeable and states that she is open to discuss patient's bed offer/ availability around noon today.  DJ, CM with [facility] is aware and will follow up with the sister today at 12 noon.  Expected Discharge Plan: Long Term Acute Care (LTAC) Barriers to Discharge: Continued Medical Work up  Expected Discharge Plan and Services   Discharge Planning Services: CM Consult   Living arrangements for the past 2 months: Single Family Home                                       Social Determinants of Health (SDOH) Interventions SDOH Screenings   Food Insecurity: No Food Insecurity (01/12/2024)  Housing: Low Risk  (01/12/2024)  Transportation Needs: No Transportation Needs (01/12/2024)  Utilities: Not At Risk (01/12/2024)  Depression (PHQ2-9): Low Risk  (07/24/2023)  Tobacco Use: Medium Risk (01/15/2024)    Readmission Risk Interventions    01/24/2024   10:43 AM 01/13/2024    9:13 AM 07/11/2023    2:11 PM  Readmission Risk Prevention Plan  Post Dischage Appt   Complete  Medication Screening   Complete  Transportation Screening Complete Complete Complete  PCP or Specialist Appt within 3-5 Days Complete Complete   HRI or Home Care Consult Complete Complete   Social Work Consult for Recovery Care Planning/Counseling Complete Complete   Palliative Care Screening Not Applicable Not Applicable   Medication Review Oceanographer) Complete Complete

## 2024-02-06 NOTE — Plan of Care (Signed)
  Problem: Education: Goal: Knowledge of General Education information will improve Description: Including pain rating scale, medication(s)/side effects and non-pharmacologic comfort measures Outcome: Progressing   Problem: Health Behavior/Discharge Planning: Goal: Ability to manage health-related needs will improve Outcome: Progressing   Problem: Clinical Measurements: Goal: Ability to maintain clinical measurements within normal limits will improve Outcome: Progressing Goal: Will remain free from infection Outcome: Progressing Goal: Diagnostic test results will improve Outcome: Progressing Goal: Respiratory complications will improve Outcome: Progressing Goal: Cardiovascular complication will be avoided Outcome: Progressing   Problem: Nutrition: Goal: Adequate nutrition will be maintained Outcome: Progressing   Problem: Coping: Goal: Level of anxiety will decrease Outcome: Progressing   Problem: Elimination: Goal: Will not experience complications related to bowel motility Outcome: Progressing Goal: Will not experience complications related to urinary retention Outcome: Progressing   Problem: Pain Managment: Goal: General experience of comfort will improve and/or be controlled Outcome: Progressing   Problem: Safety: Goal: Ability to remain free from injury will improve Outcome: Progressing   Problem: Skin Integrity: Goal: Risk for impaired skin integrity will decrease Outcome: Progressing   Problem: Activity: Goal: Ability to tolerate increased activity will improve Outcome: Progressing

## 2024-02-06 NOTE — Discharge Instructions (Signed)
 [facility]      [name], [name] [phone]  OPEN ABDOMINAL SURGERY: POST OP INSTRUCTIONS  Always review your discharge instruction sheet given to you by the facility where your surgery was performed.  IF YOU HAVE DISABILITY OR FAMILY LEAVE FORMS, YOU MUST BRING THEM TO THE OFFICE FOR PROCESSING.  PLEASE DO NOT GIVE THEM TO YOUR DOCTOR.  A prescription for pain medication may be given to you upon discharge.  Take your pain medication as prescribed, if needed.  If narcotic pain medicine is not needed, then you may take acetaminophen (Tylenol) or ibuprofen (Advil) as needed. Take your usually prescribed medications unless otherwise directed. If you need a refill on your pain medication, please contact your pharmacy. They will contact our office to request authorization.  Prescriptions will not be filled after 5pm or on week-ends. You should follow a light diet the first few days after arrival home, such as soup and crackers, pudding, etc.unless your doctor has advised otherwise. A high-fiber, low fat diet can be resumed as tolerated.   Be sure to include lots of fluids daily. Most patients will experience some swelling and bruising on the chest and neck area.  Ice packs will help.  Swelling and bruising can take several days to resolve Most patients will experience some swelling and bruising in the area of the incision. Ice pack will help. Swelling and bruising can take several days to resolve..  It is common to experience some constipation if taking pain medication after surgery.  Increasing fluid intake and taking a stool softener will usually help or prevent this problem from occurring.  A mild laxative (Milk of Magnesia or Miralax) should be taken according to package directions if there are no bowel movements after 48 hours.  You may have steri-strips (small skin tapes) in place directly over the incision.  These strips should be left on the skin for 7-10 days.  If your surgeon used skin  glue on the incision, you may shower in 24 hours.  The glue will flake off over the next 2-3 weeks.  Any sutures or staples will be removed at the office during your follow-up visit. You may find that a light gauze bandage over your incision may keep your staples from being rubbed or pulled. You may shower and replace the bandage daily. ACTIVITIES:  You may resume regular (light) daily activities beginning the next day--such as daily self-care, walking, climbing stairs--gradually increasing activities as tolerated.  You may have sexual intercourse when it is comfortable.  Refrain from any heavy lifting or straining until approved by your doctor. You may drive when you no longer are taking prescription pain medication, you can comfortably wear a seatbelt, and you can safely maneuver your car and apply brakes Return to Work: ___________________________________ Bonita Quin should see your doctor in the office for a follow-up appointment approximately two weeks after your surgery.  Make sure that you call for this appointment within a day or two after you arrive home to insure a convenient appointment time. OTHER INSTRUCTIONS:  _____________________________________________________________ _____________________________________________________________  WHEN TO CALL YOUR DOCTOR: Fever over 101.0 Inability to urinate Nausea and/or vomiting Extreme swelling or bruising Continued bleeding from incision. Increased pain, redness, or drainage from the incision. Difficulty swallowing or breathing Muscle cramping or spasms. Numbness or tingling in hands or feet or around lips.  The clinic staff is available to answer your questions during regular business hours.  Please don't hesitate to call and ask to speak to one of  the nurses if you have concerns.  For further questions, please visit [email or web address]

## 2024-02-07 DIAGNOSIS — K631 Perforation of intestine (nontraumatic): Secondary | ICD-10-CM | POA: Diagnosis not present

## 2024-02-07 LAB — CBC
HCT: 25.3 % — ABNORMAL LOW (ref 39.0–52.0)
Hemoglobin: 8 g/dL — ABNORMAL LOW (ref 13.0–17.0)
MCH: 29.3 pg (ref 26.0–34.0)
MCHC: 31.6 g/dL (ref 30.0–36.0)
MCV: 92.7 fL (ref 80.0–100.0)
Platelets: 272 10*3/uL (ref 150–400)
RBC: 2.73 MIL/uL — ABNORMAL LOW (ref 4.22–5.81)
RDW: 17.2 % — ABNORMAL HIGH (ref 11.5–15.5)
WBC: 10.1 10*3/uL (ref 4.0–10.5)
nRBC: 0 % (ref 0.0–0.2)

## 2024-02-07 LAB — BASIC METABOLIC PANEL WITH GFR
Anion gap: 7 (ref 5–15)
BUN: 17 mg/dL (ref 8–23)
CO2: 20 mmol/L — ABNORMAL LOW (ref 22–32)
Calcium: 8.2 mg/dL — ABNORMAL LOW (ref 8.9–10.3)
Chloride: 110 mmol/L (ref 98–111)
Creatinine, Ser: 1.96 mg/dL — ABNORMAL HIGH (ref 0.61–1.24)
GFR, Estimated: 38 mL/min — ABNORMAL LOW (ref >60)
Glucose, Bld: 124 mg/dL — ABNORMAL HIGH (ref 70–99)
Potassium: 4.3 mmol/L (ref 3.5–5.1)
Sodium: 137 mmol/L (ref 135–145)

## 2024-02-07 LAB — PROTIME-INR
INR: 2 — ABNORMAL HIGH (ref 0.8–1.2)
Prothrombin Time: 23.2 s — ABNORMAL HIGH (ref 11.4–15.2)

## 2024-02-07 LAB — MINIMUM INHIBITORY CONC. (1 DRUG)

## 2024-02-07 LAB — MIC RESULT

## 2024-02-07 LAB — HEPARIN LEVEL (UNFRACTIONATED): Heparin Unfractionated: 0.65 [IU]/mL (ref 0.30–0.70)

## 2024-02-07 MED ORDER — HYDROMORPHONE HCL 1 MG/ML IJ SOLN
1.0000 mg | INTRAMUSCULAR | Status: DC | PRN
  Administered 2024-02-07 – 2024-02-10 (×9): 1 mg via INTRAVENOUS
  Filled 2024-02-07 (×9): qty 1

## 2024-02-07 MED ORDER — WARFARIN SODIUM 7.5 MG PO TABS
7.5000 mg | ORAL_TABLET | Freq: Once | ORAL | Status: AC
  Administered 2024-02-07: 7.5 mg via ORAL
  Filled 2024-02-07: qty 1

## 2024-02-07 MED ORDER — OXYCODONE HCL 5 MG/5ML PO SOLN
5.0000 mg | ORAL | Status: DC | PRN
  Administered 2024-02-08: 5 mg via ORAL
  Administered 2024-02-09 – 2024-02-10 (×5): 10 mg via ORAL
  Filled 2024-02-07 (×5): qty 10
  Filled 2024-02-07: qty 5

## 2024-02-07 NOTE — Progress Notes (Signed)
 PHARMACY CONSULT NOTE FOR:  OUTPATIENT  PARENTERAL ANTIBIOTIC THERAPY   Indication: Intra-abdominal abscess Regimen: Daptomycin  800 mg IV every 24 hours + Augmentin  875 mg PO BID  End date: 02/27/24 (ID appt date)   No formal OPAT done since patient going to [facility] but orders pended with stop date.   Thank you for allowing pharmacy to be a part of this patient's care.  Denson Flake, PharmD, BCPS, BCIDP Infectious Diseases Clinical Pharmacist Phone: [phone] 02/07/2024, 2:07 PM

## 2024-02-07 NOTE — Progress Notes (Signed)
 PT Cancellation Note  Patient Details Name: Alexander [name] MRN: [id number] DOB: [birth year]   Cancelled Treatment:    Reason Eval/Treat Not Completed: Other (comment) Attempted to see pt for PT tx with pt declining participation reporting "I'm in the bed". Pt declined OOB mobility nor bed level exercises despite encouragement. Will f/u as able.  [name], PT, DPT 02/07/24, 3:11 PM   [name] 02/07/2024, 3:10 PM

## 2024-02-07 NOTE — Progress Notes (Signed)
 Occupational Therapy Treatment Patient Details Name: Alexander [name] MRN: [id number] DOB: Feb 20, 1961 Today's Date: 02/07/2024   History of present illness Alexander [name] is a 63 y.o. male admitted to [facility] on 01/11/2024 with rectal bleeding and hypotension, now S/P exploratory laparotomy where he was found to have a perforated ascending colon with intra-abdominal abscess, right colectomy without anastomosis, placement of wound VAC, and drainage of intra-abdominal abscesses. CRRT started 4/19- 01/23/24 .   I&D of abdominal wound 02/07/24 PMH: PE/DVT on Coumadin , GERD, R ankle arthroplasty   OT comments  Goals updated this session, and increased challenge as Pt had met 2/4 of goals and new goals added for LB ADL and toilet transfer (to build strength for functional transfers). Initially Pt yelling and cussing out Engineer, manufacturing. Pt agreeable to work with therapy eventually. OT assisted with ordering lunch, Pt with improved cognition since previous sessions, but could not figure out how to call down to cafeteria despite OT coaching him through step by step. Pt easily frustrated and OT placed the call for him. He was able to order his own food without assist from therapist. Mod A for face to face transfer to chair, and then Pt participated in full body bath and dressing. Pt continues to be mod A for UB and LB bathing, min A for UB dressing and max A for LB dressing. Pt also participated in seated grooming, Deoderant and lotion applied. Pt left in room chair (not recliner) at the end of the session. Encouraged Pt and RN to tentatively schedule his daily bath at a certain time so its easier to meet expectations. OT will continue to follow acutely plan for LTAC post-acute remains appropriate at this time.       If plan is discharge home, recommend the following:  Two people to help with walking and/or transfers;Two people to help with bathing/dressing/bathroom;Direct supervision/assist for medications management;Assistance  with feeding;Help with stairs or ramp for entrance   Equipment Recommendations  Other (comment) (defer to next venue of care)    Recommendations for Other Services      Precautions / Restrictions Precautions Precautions: Fall;Other (comment) Recall of Precautions/Restrictions: Impaired Precaution/Restrictions Comments: abdominal incision dehisced, wound vac, x1 bulb drains Restrictions Weight Bearing Restrictions Per Provider Order: No       Mobility Bed Mobility               General bed mobility comments: Pt on EOB when OT entered and sitting in chair when OT left    Transfers Overall transfer level: Needs assistance Equipment used: 1 person hand held assist Transfers: Bed to chair/wheelchair/BSC Sit to Stand: Mod assist   Squat pivot transfers: Mod assist       General transfer comment: Pt mod A for squat pivot to chair from bed     Balance Overall balance assessment: Needs assistance Sitting-balance support: Feet supported, Bilateral upper extremity supported, Single extremity supported Sitting balance-Leahy Scale: Fair     Standing balance support: Bilateral upper extremity supported, Reliant on assistive device for balance Standing balance-Leahy Scale: Poor Standing balance comment: dependent on UE support/external support                           ADL either performed or assessed with clinical judgement   ADL Overall ADL's : Needs assistance/impaired     Grooming: Wash/dry hands;Wash/dry face;Set up;Sitting Grooming Details (indicate cue type and reason): seated in chair by sink Upper Body Bathing: Moderate assistance Upper Body  Bathing Details (indicate cue type and reason): for back in chair by sink Lower Body Bathing: Maximal assistance Lower Body Bathing Details (indicate cue type and reason): knees down by sink Upper Body Dressing : Minimal assistance;Sitting Upper Body Dressing Details (indicate cue type and reason): donning 2  gowns front and back Lower Body Dressing: Maximal assistance;Sitting/lateral leans Lower Body Dressing Details (indicate cue type and reason): Pt will lift feet to don socks Toilet Transfer: Moderate assistance;Squat-pivot;BSC/3in1           Functional mobility during ADLs: Moderate assistance (SPT only) General ADL Comments: decreased access to LB for ADL due to abdominal wound    Extremity/Trunk Assessment Upper Extremity Assessment Upper Extremity Assessment: Generalized weakness   Lower Extremity Assessment Lower Extremity Assessment: Defer to PT evaluation        Vision       Perception     Praxis     Communication Communication Communication: Impaired Factors Affecting Communication: Reduced clarity of speech   Cognition Arousal: Alert Behavior During Therapy: WFL for tasks assessed/performed               OT - Cognition Comments: Pt is upset about RN care this session and cussing out Engineer, manufacturing.                 Following commands: Impaired Following commands impaired: Only follows one step commands consistently, Follows multi-step commands with increased time      Cueing   Cueing Techniques: Verbal cues, Tactile cues, Visual cues, Gestural cues  Exercises      Shoulder Instructions       General Comments VSS on RA    Pertinent Vitals/ Pain       Pain Assessment Pain Assessment: Faces Faces Pain Scale: Hurts little more Pain Location: abdomin Pain Descriptors / Indicators: Grimacing, Discomfort Pain Intervention(s): Monitored during session, Repositioned  Home Living                                          Prior Functioning/Environment              Frequency  Min 2X/week        Progress Toward Goals  OT Goals(current goals can now be found in the care plan section)  Progress towards OT goals: Progressing toward goals  Acute Rehab OT Goals Patient Stated Goal: get a bath every day OT Goal  Formulation: With patient Time For Goal Achievement: 02/21/24 Potential to Achieve Goals: Fair ADL Goals Pt Will Perform Eating: with modified independence;sitting Pt Will Perform Grooming: with modified independence;sitting Pt Will Perform Upper Body Dressing: with set-up;sitting Pt Will Perform Lower Body Dressing: with mod assist;with adaptive equipment;sit to/from stand Pt Will Transfer to Toilet: with min assist;squat pivot transfer;bedside commode Additional ADL Goal #1: Pt will perform bed mobility at supervision level (including line management) in preparation for ADL participation.  Plan      Co-evaluation                 AM-PAC OT "6 Clicks" Daily Activity     Outcome Measure   Help from another person eating meals?: A Little Help from another person taking care of personal grooming?: A Little Help from another person toileting, which includes using toliet, bedpan, or urinal?: A Lot Help from another person bathing (including washing, rinsing, drying)?: A Lot Help from another person  to put on and taking off regular upper body clothing?: A Little Help from another person to put on and taking off regular lower body clothing?: A Lot 6 Click Score: 15    End of Session    OT Visit Diagnosis: Muscle weakness (generalized) (M62.81);Feeding difficulties (R63.3);Pain Pain - part of body:  (abdomin)   Activity Tolerance Patient tolerated treatment well   Patient Left in chair;with call bell/phone within reach;with chair alarm set   Nurse Communication Mobility status;Precautions        Time: 4098-1191 OT Time Calculation (min): 52 min  Charges: OT General Charges $OT Visit: 1 Visit OT Treatments $Self Care/Home Management : 38-52 mins  Chales [place] OTR/L Acute Rehabilitation Services Office: [phone]   Ebony Goldstein [facility] 02/07/2024, 1:17 PM

## 2024-02-07 NOTE — Progress Notes (Signed)
 ID Brief note -continue daptomcyin and augmentin  till see by ID on 5/29 -IR in 2 week for drain management with imaging -Needs PICC placed after  central line out(communicated with primary, appreciate the assistance) -ID will SO

## 2024-02-07 NOTE — Plan of Care (Signed)
 Problem: Education: Goal: Knowledge of General Education information will improve Description: Including pain rating scale, medication(s)/side effects and non-pharmacologic comfort measures 02/07/2024 0835 by Cresenciano Doing, RN Outcome: Progressing 02/07/2024 0835 by Cresenciano Doing, RN Outcome: Not Progressing   Problem: Health Behavior/Discharge Planning: Goal: Ability to manage health-related needs will improve 02/07/2024 0835 by Cresenciano Doing, RN Outcome: Progressing 02/07/2024 0835 by Cresenciano Doing, RN Outcome: Not Progressing   Problem: Clinical Measurements: Goal: Ability to maintain clinical measurements within normal limits will improve 02/07/2024 0835 by Cresenciano Doing, RN Outcome: Progressing 02/07/2024 0835 by Cresenciano Doing, RN Outcome: Not Progressing Goal: Will remain free from infection 02/07/2024 0835 by Cresenciano Doing, RN Outcome: Progressing 02/07/2024 0835 by Cresenciano Doing, RN Outcome: Not Progressing Goal: Diagnostic test results will improve 02/07/2024 0835 by Cresenciano Doing, RN Outcome: Progressing 02/07/2024 0835 by Cresenciano Doing, RN Outcome: Not Progressing Goal: Respiratory complications will improve 02/07/2024 0835 by Cresenciano Doing, RN Outcome: Progressing 02/07/2024 0835 by Cresenciano Doing, RN Outcome: Not Progressing Goal: Cardiovascular complication will be avoided 02/07/2024 0835 by Cresenciano Doing, RN Outcome: Progressing 02/07/2024 0835 by Cresenciano Doing, RN Outcome: Not Progressing   Problem: Nutrition: Goal: Adequate nutrition will be maintained 02/07/2024 0835 by Cresenciano Doing, RN Outcome: Progressing 02/07/2024 0835 by Cresenciano Doing, RN Outcome: Not Progressing   Problem: Coping: Goal: Level of anxiety will decrease 02/07/2024 0835 by Cresenciano Doing, RN Outcome: Progressing 02/07/2024 0835 by Cresenciano Doing, RN Outcome: Not Progressing   Problem: Elimination: Goal: Will not experience complications related to bowel motility 02/07/2024  0835 by Cresenciano Doing, RN Outcome: Progressing 02/07/2024 0835 by Cresenciano Doing, RN Outcome: Not Progressing Goal: Will not experience complications related to urinary retention 02/07/2024 0835 by Cresenciano Doing, RN Outcome: Progressing 02/07/2024 0835 by Cresenciano Doing, RN Outcome: Not Progressing   Problem: Pain Managment: Goal: General experience of comfort will improve and/or be controlled 02/07/2024 0835 by Cresenciano Doing, RN Outcome: Progressing 02/07/2024 0835 by Cresenciano Doing, RN Outcome: Not Progressing   Problem: Safety: Goal: Ability to remain free from injury will improve 02/07/2024 0835 by Cresenciano Doing, RN Outcome: Progressing 02/07/2024 0835 by Cresenciano Doing, RN Outcome: Not Progressing   Problem: Skin Integrity: Goal: Risk for impaired skin integrity will decrease 02/07/2024 0835 by Cresenciano Doing, RN Outcome: Progressing 02/07/2024 0835 by Cresenciano Doing, RN Outcome: Not Progressing   Problem: Activity: Goal: Ability to tolerate increased activity will improve 02/07/2024 0835 by Cresenciano Doing, RN Outcome: Progressing 02/07/2024 0835 by Cresenciano Doing, RN Outcome: Not Progressing   Problem: Respiratory: Goal: Ability to maintain a clear airway and adequate ventilation will improve 02/07/2024 0835 by Cresenciano Doing, RN Outcome: Progressing 02/07/2024 0835 by Cresenciano Doing, RN Outcome: Not Progressing   Problem: Role Relationship: Goal: Method of communication will improve 02/07/2024 0835 by Cresenciano Doing, RN Outcome: Progressing 02/07/2024 0835 by Cresenciano Doing, RN Outcome: Not Progressing   Problem: Education: Goal: Ability to describe self-care measures that may prevent or decrease complications (Diabetes Survival Skills Education) will improve 02/07/2024 0835 by Cresenciano Doing, RN Outcome: Progressing 02/07/2024 0835 by Cresenciano Doing, RN Outcome: Not Progressing Goal: Individualized Educational Video(s) 02/07/2024 0835 by Cresenciano Doing,  RN Outcome: Progressing 02/07/2024 0835 by Cresenciano Doing, RN Outcome: Not Progressing   Problem: Coping: Goal: Ability to adjust to condition or change in health will  improve 02/07/2024 0835 by Cresenciano Doing, RN Outcome: Progressing 02/07/2024 0835 by Cresenciano Doing, RN Outcome: Not Progressing   Problem: Fluid Volume: Goal: Ability to maintain a balanced intake and output will improve 02/07/2024 0835 by Cresenciano Doing, RN Outcome: Progressing 02/07/2024 0835 by Cresenciano Doing, RN Outcome: Not Progressing   Problem: Health Behavior/Discharge Planning: Goal: Ability to identify and utilize available resources and services will improve 02/07/2024 0835 by Cresenciano Doing, RN Outcome: Progressing 02/07/2024 0835 by Cresenciano Doing, RN Outcome: Not Progressing Goal: Ability to manage health-related needs will improve 02/07/2024 0835 by Cresenciano Doing, RN Outcome: Progressing 02/07/2024 0835 by Cresenciano Doing, RN Outcome: Not Progressing   Problem: Metabolic: Goal: Ability to maintain appropriate glucose levels will improve 02/07/2024 0835 by Cresenciano Doing, RN Outcome: Progressing 02/07/2024 0835 by Cresenciano Doing, RN Outcome: Not Progressing   Problem: Nutritional: Goal: Maintenance of adequate nutrition will improve 02/07/2024 0835 by Cresenciano Doing, RN Outcome: Progressing 02/07/2024 0835 by Cresenciano Doing, RN Outcome: Not Progressing Goal: Progress toward achieving an optimal weight will improve 02/07/2024 0835 by Cresenciano Doing, RN Outcome: Progressing 02/07/2024 0835 by Cresenciano Doing, RN Outcome: Not Progressing   Problem: Skin Integrity: Goal: Risk for impaired skin integrity will decrease 02/07/2024 0835 by Cresenciano Doing, RN Outcome: Progressing 02/07/2024 0835 by Cresenciano Doing, RN Outcome: Not Progressing   Problem: Tissue Perfusion: Goal: Adequacy of tissue perfusion will improve 02/07/2024 0835 by Cresenciano Doing, RN Outcome: Progressing 02/07/2024 0835 by Cresenciano Doing, RN Outcome: Not Progressing

## 2024-02-07 NOTE — Progress Notes (Signed)
 PHARMACY - ANTICOAGULATION CONSULT NOTE  Pharmacy Consult for Heparin  and Warfarin Indication: history of PE and DVT  No Known Allergies  Patient Measurements: Height: 5\' 9"  (175.3 cm) Weight: 74.2 kg (163 lb 9.3 oz) IBW/kg (Calculated) : 70.7 HEPARIN  DW (KG): 78.1  Vital Signs: Temp: 98.8 F (37.1 C) (05/09 0800) Temp Source: Oral (05/09 0800) BP: 124/85 (05/09 0800) Pulse Rate: 86 (05/09 0800)  Labs: Recent Labs    02/05/24 0440 02/06/24 0557 02/06/24 1723 02/07/24 0907  HGB 7.5* 7.6*  --  8.0*  HCT 24.0* 23.8*  --  25.3*  PLT 171 214  --  272  LABPROT 16.0* 18.6*  --  23.2*  INR 1.3* 1.5*  --  2.0*  HEPARINUNFRC 0.50 0.52 0.35 0.65  CREATININE 2.34* 2.15*  --  1.96*    Estimated Creatinine Clearance: 39.6 mL/min (A) (by C-G formula based on SCr of 1.96 mg/dL (H)).  Assessment: 13 YOM who presented to the ED on 01/11/24 with rectal bleeding, hypotension. On warfarin PTA for hx PE and DVT.  Reversed and held on admit. Pt had ongoing rectal bleeding requiring transfusions, taken to OR on 4/16 for ex lap, right colectomy, abscess drainage, and placement of wound vac. Hospitalization complicated by hemorrhagic and septic shock requiring massive transfusion protocol.  Last transfusion 4/30.  Anticoagulation resumed 5/2 with IV heparin , low therapeutic goal. Warfarin resumed 02/04/24.  PTA warfarin regimen: 10 mg daily (using 5 mg tablets)  Heparin  level 0.35 last night on 2000 units/hr > up to 0.65 today.  INR up to 2.0 after Warfarin 10 mg > 12.5 mg > 10 mg the last 3 days. Expect further trend up tomorrow. Hemoglobin stable. No bleeding reported.     Goal of Therapy:  INR 2-3 Heparin  level 0.3-0.5 units/ml Monitor platelets by anticoagulation protocol: Yes   Plan:  Stop IV heparin  per discussion with Dr. Jonelle Neri. Warfarin 7.5 mg x 1 today; anticipate resuming usual 10 mg daily dose on 5/10. Daily PT/INR.  CBC in am and as needed. Monitor for signs/symptoms of  bleeding.  Adolphus Akin, RPh 02/07/2024,10:16 AM

## 2024-02-07 NOTE — Consult Note (Signed)
 WOC Nurse wound follow up Wound type: surgical wound Seen with PA to evaluate wound status s/p placement of NPWT dressing Measurement: see 5/6 note Wound YNW:GNFA improved, granulation tissue present, Alexander [name] at bedside for debridement of minimal amounts of loose avascular tissue. New area distal aspect of wound bed with yellow slough.  Drainage (amount, consistency, odor) serosanguinous in canister  Periwound: intact  Dressing procedure/placement/frequency: Removed old NPWT dressing (2 pc of white foam, 2 pc of black foam)  Filled wound with  __3__ piece of white foam (easy application now) sutures are loose enough to place white foam) __1__ piece of black foam  Sealed NPWT dressing at HG Patient received IV pain medication per bedside nurse prior to dressing change Patient tolerated procedure well WOC nurse will continue to provide NPWT dressing changed due to the complexity of the dressing change.   WOC Nurse ostomy follow up Stoma type/location: RUQ, ileosotmy; pouch intact; emptied 300 cc green liquid stool from pouch. Reported this to nursing team to record.   WOC nursing team will follow along for NPWT and ostomy care. Patient planning to be DC to LTAC.   Kadia Abaya [facility], CNS, CWON-AP [phone]      .

## 2024-02-07 NOTE — Progress Notes (Signed)
 Alexander [name]  OZH:[id number] DOB: [birth year] DOA: 01/11/2024 PCP: Alexander Iba, PA    Brief Narrative:  63 year old with a history of pulmonary embolism on chronic Coumadin  and diastolic CHF who presented to the hospital 4/12 with bright red blood per rectum.  Workup ultimately revealed a perforated colon.  Patient underwent an exploratory laparotomy followed by a stay in the ICU.  During his hospitalization he developed acute kidney failure requiring CRRT with subsequent improvement in renal function prior to the initiation of intermittent dialysis.  He continues on broad-spectrum antibiotics for his intra-abdominal and wound infections and is being followed by General Surgery and Infectious Disease.  Significant Events: 4/12 admit with BRBPR 4/15 repeat CT suggest contained perforation of the ascending colon -General Surgery evaluated 4/16 exploratory laparotomy -postop CODE BLUE 4/17 back to OR -small bowel resection and wound VAC placement 4/19 back to OR -exploratory laparotomy, ileostomy creation, small bowel resection, wound VAC placement -CRRT initiated 4/23 extubated -off pressors 4/25 transfer from [place] long ICU to [facility] progressive care for possible HD 4/29 triple-lumen CVC placement for TPN 4/30 CT-guided drain placement abdominal abscess -HD catheter placement 5/2 diet advanced to clears 5/5 diet advanced to regular  Goals of Care:   Code Status: Full Code   DVT prophylaxis: SCD's Start: 01/15/24 1400 warfarin (COUMADIN ) tablet 7.5 mg   Interim Hx: Vital signs stable.  Afebrile.  Creatinine continues to improve.  Hemoglobin trending upward.  INR now at goal.  Resting comfortably with no complaints today.  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 Has required multiple follow-up surgeries as noted above - now status post right hemicolectomy and partial small bowel resection with ileostomy creation - wound VAC in place - now  off TPN and able to support himself with oral intake alone - broad-spectrum antibiotic as per ID, with end of treatment course to be determined based upon ongoing clinical improvement  Acute kidney failure Required CRRT during initial portion of hospital stay -due to combination of septic shock leading to ATN as well as possible IV contrast injury -did not require initiation of intermittent HD - renal function is steadily improving   Acute blood loss anemia with anemia of critical illness - iron  and B12 deficiencies Transfused as needed to keep hemoglobin 7.0 or > - hemoglobin holding steady/improving at this time   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 Drain culture growing this organism - antibiotic per ID recommendations, with plan to continue daptomycin  at LTAC - unasyn  changed to augemtin by ID prior to d/c   Septic shock - hypotension Required vasopressors during initial portion of hospital stay -blood pressure is now stable without vasopressor support  History of PE October 2024 Anticoagulation held initially with BRBPR - has now been transitioned back to warfarin with IV heparin  bridge completed   Acute hypoxic respiratory failure due to intra-abdominal catastrophe and septic shock with cardiopulmonary arrest Required short-term intubation - extubated successfully 4/23 -oxygen saturations now stable on room air  Encephalopathy - ICU induced delirium Resolved with patient now having returned to his baseline mental status  Acute right knee pain  Plain film x-ray unrevealing - uric acid normal   Family Communication: No family present at time of exam Disposition: Investigating possibility of LTAC stay -otherwise will need SNF rehab stay -complex wound care complicating disposition - appears will be medically stable for d/c to LTAC 5/9   Objective: Blood pressure 124/85, pulse 86, temperature 98.8 F (37.1 C), temperature source Oral,  resp. rate 19, height  5\' 9"  (1.753 m), weight 74.2 kg, SpO2 100%.  Intake/Output Summary (Last 24 hours) at 02/07/2024 1051 Last data filed at 02/07/2024 0800 Gross per 24 hour  Intake 1215.23 ml  Output 3390 ml  Net -2174.77 ml   Filed Weights   02/03/24 0628 02/04/24 0401 02/05/24 0508  Weight: 75.6 kg 74 kg 74.2 kg    Examination: General: No acute respiratory distress Lungs: Clear to auscultation bilaterally Cardiovascular: Regular rate and rhythm without murmur  Abdomen: Soft, wound VAC in place, bowel sounds present peripherally, no rebound Extremities: No significant cyanosis, clubbing, or edema bilateral lower extremities  CBC: Recent Labs  Lab 02/05/24 0440 02/06/24 0557 02/07/24 0907  WBC 8.5 10.4 10.1  HGB 7.5* 7.6* 8.0*  HCT 24.0* 23.8* 25.3*  MCV 93.8 93.3 92.7  PLT 171 214 272   Basic Metabolic Panel: Recent Labs  Lab 02/03/24 0340 02/04/24 0423 02/04/24 0500 02/05/24 0440 02/06/24 0557 02/07/24 0907  NA 141  138 140 141 138 137 137  K 4.2  4.1 4.4 4.4 4.4 4.3 4.3  CL 110  108 110 109 107 108 110  CO2 21*  21* 23 24 23  21* 20*  GLUCOSE 123*  125* 109* 116* 95 113* 124*  BUN 54*  54* 44* 43* 35* 25* 17  CREATININE 3.15*  3.05* 2.41* 2.40* 2.34* 2.15* 1.96*  CALCIUM  8.1*  7.9* 8.1* 8.2* 8.2* 8.1* 8.2*  MG 1.9 1.8  --   --  1.8  --   PHOS 3.2  3.3 4.1 4.5 4.2 4.5  --    GFR: Estimated Creatinine Clearance: 39.6 mL/min (A) (by C-G formula based on SCr of 1.96 mg/dL (H)).   Scheduled Meds:  amoxicillin -clavulanate  1 tablet Oral Q12H   Chlorhexidine  Gluconate Cloth  6 each Topical Daily   diclofenac  Sodium  2 g Topical QID   feeding supplement  237 mL Oral BID BM   ferrous sulfate   325 mg Oral Q breakfast   loperamide   4 mg Oral QID   multivitamin with minerals  1 tablet Oral Daily   mouth rinse  15 mL Mouth Rinse 4 times per day   pantoprazole   40 mg Oral BID   polycarbophil  1,250 mg Oral BID   sodium chloride  flush  5 mL Intracatheter Q8H   sucralfate    1 g Oral BID   warfarin  7.5 mg Oral ONCE-1600   Warfarin - Pharmacist Dosing Inpatient   Does not apply q1600   Continuous Infusions:  sodium chloride  Stopped (01/24/24 2213)   DAPTOmycin  Stopped (02/06/24 1736)     LOS: 27 days   Abbe Abate, MD [facility] Office  [phone] Pager - Text Page per Tilford Foley  If 7PM-7AM, please contact night-coverage per Amion 02/07/2024, 10:51 AM

## 2024-02-08 ENCOUNTER — Other Ambulatory Visit: Payer: Self-pay

## 2024-02-08 DIAGNOSIS — K631 Perforation of intestine (nontraumatic): Secondary | ICD-10-CM | POA: Diagnosis not present

## 2024-02-08 LAB — CBC
HCT: 27.1 % — ABNORMAL LOW (ref 39.0–52.0)
Hemoglobin: 8.3 g/dL — ABNORMAL LOW (ref 13.0–17.0)
MCH: 29.7 pg (ref 26.0–34.0)
MCHC: 30.6 g/dL (ref 30.0–36.0)
MCV: 97.1 fL (ref 80.0–100.0)
Platelets: 285 10*3/uL (ref 150–400)
RBC: 2.79 MIL/uL — ABNORMAL LOW (ref 4.22–5.81)
RDW: 17.2 % — ABNORMAL HIGH (ref 11.5–15.5)
WBC: 11.2 10*3/uL — ABNORMAL HIGH (ref 4.0–10.5)
nRBC: 0 % (ref 0.0–0.2)

## 2024-02-08 LAB — PROTIME-INR
INR: 2 — ABNORMAL HIGH (ref 0.8–1.2)
Prothrombin Time: 23.2 s — ABNORMAL HIGH (ref 11.4–15.2)

## 2024-02-08 MED ORDER — SODIUM CHLORIDE 0.9% FLUSH
10.0000 mL | Freq: Two times a day (BID) | INTRAVENOUS | Status: DC
  Administered 2024-02-08 – 2024-02-10 (×5): 10 mL

## 2024-02-08 MED ORDER — SODIUM CHLORIDE 0.9% FLUSH: 10.0000 mL | INTRAVENOUS | Status: DC | PRN

## 2024-02-08 MED ORDER — WARFARIN SODIUM 10 MG PO TABS
10.0000 mg | ORAL_TABLET | Freq: Once | ORAL | Status: AC
  Administered 2024-02-08: 10 mg via ORAL
  Filled 2024-02-08: qty 1

## 2024-02-08 NOTE — Progress Notes (Signed)
CVAD removed per protocol per MD order. Manual pressure applied for 10 mins. Vaseline gauze, gauze, and Tegaderm applied over insertion site. No bleeding or swelling noted. Instructed patient to remain in bed for thirty mins. Educated patient about S/S of infection and when to call MD; keep dressing dry and intact for 24 hours. Pt verbalized comprehension.

## 2024-02-08 NOTE — Plan of Care (Signed)
  Problem: Education: Goal: Knowledge of General Education information will improve Description: Including pain rating scale, medication(s)/side effects and non-pharmacologic comfort measures Outcome: Progressing   Problem: Health Behavior/Discharge Planning: Goal: Ability to manage health-related needs will improve Outcome: Progressing   Problem: Clinical Measurements: Goal: Ability to maintain clinical measurements within normal limits will improve Outcome: Progressing Goal: Will remain free from infection Outcome: Progressing Goal: Diagnostic test results will improve Outcome: Progressing Goal: Respiratory complications will improve Outcome: Progressing Goal: Cardiovascular complication will be avoided Outcome: Progressing   Problem: Nutrition: Goal: Adequate nutrition will be maintained Outcome: Progressing   Problem: Coping: Goal: Level of anxiety will decrease Outcome: Progressing   Problem: Elimination: Goal: Will not experience complications related to bowel motility Outcome: Progressing Goal: Will not experience complications related to urinary retention Outcome: Progressing   Problem: Pain Managment: Goal: General experience of comfort will improve and/or be controlled Outcome: Progressing   Problem: Safety: Goal: Ability to remain free from injury will improve Outcome: Progressing   Problem: Skin Integrity: Goal: Risk for impaired skin integrity will decrease Outcome: Progressing   Problem: Activity: Goal: Ability to tolerate increased activity will improve Outcome: Progressing   Problem: Respiratory: Goal: Ability to maintain a clear airway and adequate ventilation will improve Outcome: Progressing   Problem: Role Relationship: Goal: Method of communication will improve Outcome: Progressing   Problem: Education: Goal: Ability to describe self-care measures that may prevent or decrease complications (Diabetes Survival Skills Education) will  improve Outcome: Progressing Goal: Individualized Educational Video(s) Outcome: Progressing   Problem: Coping: Goal: Ability to adjust to condition or change in health will improve Outcome: Progressing   Problem: Fluid Volume: Goal: Ability to maintain a balanced intake and output will improve Outcome: Progressing   Problem: Health Behavior/Discharge Planning: Goal: Ability to identify and utilize available resources and services will improve Outcome: Progressing Goal: Ability to manage health-related needs will improve Outcome: Progressing   Problem: Metabolic: Goal: Ability to maintain appropriate glucose levels will improve Outcome: Progressing   Problem: Nutritional: Goal: Maintenance of adequate nutrition will improve Outcome: Progressing Goal: Progress toward achieving an optimal weight will improve Outcome: Progressing   Problem: Skin Integrity: Goal: Risk for impaired skin integrity will decrease Outcome: Progressing   Problem: Tissue Perfusion: Goal: Adequacy of tissue perfusion will improve Outcome: Progressing

## 2024-02-08 NOTE — Progress Notes (Signed)
 Consult to remove RIJ-CVC after PICC placed. Patient started cursing and said "wants to eat now" Patient refused to have CVC removed at this time. Will return at a later time to remove CVC

## 2024-02-08 NOTE — Progress Notes (Signed)
 Alexander [name]  WUJ:[id number] DOB: 08-09-1961 DOA: 01/11/2024 PCP: Alexander Iba, PA    Brief Narrative:  63 year old with a history of pulmonary embolism on chronic Coumadin  and diastolic CHF who presented to the hospital 4/12 with bright red blood per rectum.  Workup ultimately revealed a perforated colon.  Patient underwent an exploratory laparotomy followed by a stay in the ICU.  During his hospitalization he developed acute kidney failure requiring CRRT with subsequent improvement in renal function prior to the initiation of intermittent dialysis.  He continues on broad-spectrum antibiotics for his intra-abdominal and wound infections and is being followed by General Surgery and Infectious Disease.  Significant Events: 4/12 admit with BRBPR 4/15 repeat CT suggest contained perforation of the ascending colon -General Surgery evaluated 4/16 exploratory laparotomy -postop CODE BLUE 4/17 back to OR -small bowel resection and wound VAC placement 4/19 back to OR -exploratory laparotomy, ileostomy creation, small bowel resection, wound VAC placement -CRRT initiated 4/23 extubated -off pressors 4/25 transfer from [place] long ICU to [facility] progressive care for possible HD 4/29 triple-lumen CVC placement for TPN 4/30 CT-guided drain placement abdominal abscess -HD catheter placement 5/2 diet advanced to clears 5/5 diet advanced to regular  Goals of Care:   Code Status: Full Code   DVT prophylaxis: SCD's Start: 01/15/24 1400   Interim Hx: No acute events recorded overnight.  Afebrile.  Vital signs stable.  No new complaints today.  Patient is motivated to be discharged to soon as possible.  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 Drain culture growing this organism - antibiotic per ID recommendations, with plan to continue daptomycin  at LTAC - unasyn  changed to augemtin by ID prior to d/c - to be seen in IR clinic 2 weeks after d/c for f/u drain imaging and management   Acute kidney failure - resolving  Required CRRT during initial portion of hospital stay -due to combination of septic shock leading to ATN as well as possible IV contrast injury -did not require initiation of intermittent HD - renal function is steadily improving and expected to normalize   Acute blood loss anemia with anemia of critical illness - iron  and B12 deficiencies Transfused as needed to keep hemoglobin 7.0 or > - hemoglobin holding steady/improving at this time   Septic shock - hypotension - resolved  Required vasopressors during initial portion of hospital stay -blood pressure is now stable without vasopressor support  History of PE October 2024 Anticoagulation held initially with BRBPR - has now been transitioned back to warfarin with IV heparin  bridge completed   Acute hypoxic respiratory failure due to intra-abdominal catastrophe and septic shock with cardiopulmonary arrest Required short-term intubation - extubated successfully 4/23 - oxygen saturations now stable on room air  Encephalopathy - ICU induced delirium Resolved with patient now having returned to his baseline mental status  Acute right knee pain  Plain film x-ray unrevealing - uric acid normal - resolved at this time    Family Communication: No family present at time of exam Disposition: Medically  stable for LTAC stay when arrangements completed   Objective: Blood pressure 128/76, pulse 88, temperature 98.6 F (37 C),  temperature source Oral, resp. rate 17, height 5\' 9"  (1.753 m), weight 74.2 kg, SpO2 99%.  Intake/Output Summary (Last 24 hours) at 02/08/2024 1116 Last data filed at 02/08/2024 0901 Gross per 24 hour  Intake 830 ml  Output 1885 ml  Net -1055 ml   Filed Weights   02/03/24 0628 02/04/24 0401 02/05/24 0508  Weight: 75.6 kg 74 kg 74.2 kg    Examination: General: No acute respiratory distress Lungs: Clear to auscultation bilaterally Cardiovascular: Regular rate and rhythm without murmur  Abdomen: Soft, wound VAC in place, bowel sounds present peripherally, no rebound Extremities: No signif edema bilateral lower extremities  CBC: Recent Labs  Lab 02/05/24 0440 02/06/24 0557 02/07/24 0907  WBC 8.5 10.4 10.1  HGB 7.5* 7.6* 8.0*  HCT 24.0* 23.8* 25.3*  MCV 93.8 93.3 92.7  PLT 171 214 272   Basic Metabolic Panel: Recent Labs  Lab 02/03/24 0340 02/04/24 0423 02/04/24 0500 02/05/24 0440 02/06/24 0557 02/07/24 0907  NA 141  138 140 141 138 137 137  K 4.2  4.1 4.4 4.4 4.4 4.3 4.3  CL 110  108 110 109 107 108 110  CO2 21*  21* 23 24 23  21* 20*  GLUCOSE 123*  125* 109* 116* 95 113* 124*  BUN 54*  54* 44* 43* 35* 25* 17  CREATININE 3.15*  3.05* 2.41* 2.40* 2.34* 2.15* 1.96*  CALCIUM  8.1*  7.9* 8.1* 8.2* 8.2* 8.1* 8.2*  MG 1.9 1.8  --   --  1.8  --   PHOS 3.2  3.3 4.1 4.5 4.2 4.5  --    GFR: Estimated Creatinine Clearance: 39.6 mL/min (A) (by C-G formula based on SCr of 1.96 mg/dL (H)).   Scheduled Meds:  amoxicillin -clavulanate  1 tablet Oral Q12H   Chlorhexidine  Gluconate Cloth  6 each Topical Daily   diclofenac  Sodium  2 g Topical QID   feeding supplement  237 mL Oral BID BM   ferrous sulfate   325 mg Oral Q breakfast   loperamide   4 mg Oral QID   multivitamin with minerals  1 tablet Oral Daily   mouth rinse  15 mL Mouth Rinse 4 times per day   pantoprazole   40 mg Oral BID   polycarbophil  1,250 mg Oral BID   sodium chloride  flush  5 mL Intracatheter Q8H    sucralfate   1 g Oral BID   Warfarin - Pharmacist Dosing Inpatient   Does not apply q1600   Continuous Infusions:  sodium chloride  Stopped (01/24/24 2213)   DAPTOmycin  800 mg (02/07/24 1419)     LOS: 28 days   Abbe Abate, MD [facility] Office  [phone] Pager - Text Page per Tilford Foley  If 7PM-7AM, please contact night-coverage per Amion 02/08/2024, 11:16 AM

## 2024-02-08 NOTE — Progress Notes (Signed)
 Rounded on pt to remove central line per order. Pt requesting pain meds before removal. Primary RN notified pt needed pain meds. Spoke with Dr. Jonelle Neri who confirmed plan is for pt to get PICC. Plan to insert PICC before removal of internal jugular CVC.

## 2024-02-08 NOTE — Progress Notes (Signed)
 Clarification with Dr. Jonelle Neri and ID re PICC order.  Okay to place PICC line and then remove CVC.  No concerns at this point for CLABSI to need a line holiday.

## 2024-02-08 NOTE — Progress Notes (Signed)
 Peripherally Inserted Central Catheter Placement  The IV Nurse has discussed with the patient and/or persons authorized to consent for the patient, the purpose of this procedure and the potential benefits and risks involved with this procedure.  The benefits include less needle sticks, lab draws from the catheter, and the patient may be discharged home with the catheter. Risks include, but not limited to, infection, bleeding, blood clot (thrombus formation), and puncture of an artery; nerve damage and irregular heartbeat and possibility to perform a PICC exchange if needed/ordered by physician.  Alternatives to this procedure were also discussed.  Bard Power PICC patient education guide, fact sheet on infection prevention and patient information card has been provided to patient /or left at bedside.    PICC Placement Documentation  PICC Single Lumen 02/08/24 Right Brachial 37 cm 0 cm (Active)  Indication for Insertion or Continuance of Line Prolonged intravenous therapies 02/08/24 1358  Exposed Catheter (cm) 0 cm 02/08/24 1358  Site Assessment Clean, Dry, Intact 02/08/24 1358  Line Status Flushed;Saline locked;Blood return noted 02/08/24 1358  Dressing Type Transparent;Securing device 02/08/24 1358  Dressing Status Antimicrobial disc/dressing in place;Clean, Dry, Intact 02/08/24 1358  Line Care Connections checked and tightened 02/08/24 1358  Line Adjustment (NICU/IV Team Only) No 02/08/24 1358  Dressing Intervention New dressing;Adhesive placed at insertion site (IV team only);Adhesive placed around edges of dressing (IV team/ICU RN only) 02/08/24 1358  Dressing Change Due 02/15/24 02/08/24 1358       Allegra Arch 02/08/2024, 2:00 PM

## 2024-02-08 NOTE — Progress Notes (Addendum)
 PHARMACY - ANTICOAGULATION CONSULT NOTE  Pharmacy Consult for Warfarin Indication: history of PE and DVT  No Known Allergies  Patient Measurements: Height: 5\' 9"  (175.3 cm) Weight: 74.2 kg (163 lb 9.3 oz) IBW/kg (Calculated) : 70.7 HEPARIN  DW (KG): 78.1  Vital Signs: Temp: 98.5 F (36.9 C) (05/10 0507) Temp Source: Oral (05/10 0507) BP: 120/70 (05/10 0507) Pulse Rate: 83 (05/10 0507)  Labs: Recent Labs    02/06/24 0557 02/06/24 1723 02/07/24 0907 02/08/24 1023 02/08/24 1208  HGB 7.6*  --  8.0* 8.3*  --   HCT 23.8*  --  25.3* 27.1*  --   PLT 214  --  272 285  --   LABPROT 18.6*  --  23.2*  --  23.2*  INR 1.5*  --  2.0*  --  2.0*  HEPARINUNFRC 0.52 0.35 0.65  --   --   CREATININE 2.15*  --  1.96*  --   --     Estimated Creatinine Clearance: 39.6 mL/min (A) (by C-G formula based on SCr of 1.96 mg/dL (H)).  Assessment: 62 YOM who presented to the ED on 01/11/24 with rectal bleeding, hypotension. On warfarin PTA for hx PE and DVT.  Reversed and held on admit. Pt had ongoing rectal bleeding requiring transfusions, taken to OR on 4/16 for ex lap, right colectomy, abscess drainage, and placement of wound vac. Hospitalization complicated by hemorrhagic and septic shock requiring massive transfusion protocol.  Last transfusion 4/30.  Anticoagulation resumed 5/2 with IV heparin , low therapeutic goal. Warfarin resumed 02/04/24.  PTA warfarin regimen: 10 mg daily Heparin  gtt discontinued  5/10 AM update: INR 2.0 Hgb 8.3 PLT wnl  Goal of Therapy:  INR  2-3 Monitor platelets by anticoagulation protocol: Yes   Plan:  Warfarin 10 mg po x1 today Daily PT/INR.  CBC in am and as needed. Monitor for signs/symptoms of bleeding.  Marleta Simmer BS, PharmD, BCPS Clinical Pharmacist 02/08/2024 8:03 AM  Contact: [phone] after 3 PM  "Be curious, not judgmental..." -Rumalda Counter

## 2024-02-09 DIAGNOSIS — K631 Perforation of intestine (nontraumatic): Secondary | ICD-10-CM | POA: Diagnosis not present

## 2024-02-09 LAB — CBC
HCT: 24.7 % — ABNORMAL LOW (ref 39.0–52.0)
Hemoglobin: 7.5 g/dL — ABNORMAL LOW (ref 13.0–17.0)
MCH: 29.1 pg (ref 26.0–34.0)
MCHC: 30.4 g/dL (ref 30.0–36.0)
MCV: 95.7 fL (ref 80.0–100.0)
Platelets: 319 10*3/uL (ref 150–400)
RBC: 2.58 MIL/uL — ABNORMAL LOW (ref 4.22–5.81)
RDW: 17.1 % — ABNORMAL HIGH (ref 11.5–15.5)
WBC: 12.2 10*3/uL — ABNORMAL HIGH (ref 4.0–10.5)
nRBC: 0 % (ref 0.0–0.2)

## 2024-02-09 LAB — BASIC METABOLIC PANEL WITH GFR
Anion gap: 7 (ref 5–15)
BUN: 14 mg/dL (ref 8–23)
CO2: 21 mmol/L — ABNORMAL LOW (ref 22–32)
Calcium: 8.2 mg/dL — ABNORMAL LOW (ref 8.9–10.3)
Chloride: 107 mmol/L (ref 98–111)
Creatinine, Ser: 1.52 mg/dL — ABNORMAL HIGH (ref 0.61–1.24)
GFR, Estimated: 52 mL/min — ABNORMAL LOW (ref >60)
Glucose, Bld: 93 mg/dL (ref 70–99)
Potassium: 4.7 mmol/L (ref 3.5–5.1)
Sodium: 135 mmol/L (ref 135–145)

## 2024-02-09 LAB — PROTIME-INR
INR: 2.3 — ABNORMAL HIGH (ref 0.8–1.2)
Prothrombin Time: 25.4 s — ABNORMAL HIGH (ref 11.4–15.2)

## 2024-02-09 MED ORDER — WARFARIN SODIUM 10 MG PO TABS: 10.0000 mg | ORAL_TABLET | Freq: Once | ORAL | Status: DC

## 2024-02-09 MED ORDER — WARFARIN SODIUM 7.5 MG PO TABS
8.5000 mg | ORAL_TABLET | Freq: Once | ORAL | Status: AC
  Administered 2024-02-09: 8.5 mg via ORAL
  Filled 2024-02-09: qty 1

## 2024-02-09 NOTE — Progress Notes (Signed)
 Alexander [name]  YNW:[id number] DOB: [birth year] DOA: 01/11/2024 PCP: Alexander Iba, PA    Brief Narrative:  63 year old with a history of pulmonary embolism on chronic Coumadin  and diastolic CHF who presented to the hospital 4/12 with bright red blood per rectum.  Workup ultimately revealed a perforated colon.  Patient underwent an exploratory laparotomy followed by a stay in the ICU.  During his hospitalization he developed acute kidney failure requiring CRRT with subsequent improvement in renal function prior to the initiation of intermittent dialysis.  He continues on broad-spectrum antibiotics for his intra-abdominal and wound infections and is being followed by General Surgery and Infectious Disease.  Significant Events: 4/12 admit with BRBPR 4/15 repeat CT suggest contained perforation of the ascending colon -General Surgery evaluated 4/16 exploratory laparotomy -postop CODE BLUE 4/17 back to OR -small bowel resection and wound VAC placement 4/19 back to OR -exploratory laparotomy, ileostomy creation, small bowel resection, wound VAC placement -CRRT initiated 4/23 extubated -off pressors 4/25 transfer from [place] long ICU to [facility] progressive care for possible HD 4/29 triple-lumen CVC placement for TPN 4/30 CT-guided drain placement abdominal abscess -HD catheter placement 5/2 diet advanced to clears 5/5 diet advanced to regular  Goals of Care:   Code Status: Full Code   DVT prophylaxis: SCD's Start: 01/15/24 1400 warfarin (COUMADIN ) tablet 8.5 mg   Interim Hx: Vital signs are stable.  The patient is afebrile.  His nontunneled central venous catheter was removed yesterday and a PICC line was successfully obtained in his right upper extremity.  His renal function continues to recover.  His INR is at goal.  He tells me that his eyeglasses have been lost since his admission.  He complains that nursing staff is slow to respond to his concerns at night.  He is anxious to be  discharged as soon as possible.  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 Has required multiple follow-up surgeries as noted above - now status post right hemicolectomy and partial small bowel resection with ileostomy creation - wound VAC in place - now off TPN and able to support himself with oral intake alone - broad-spectrum antibiotic as per ID (oral augmentin  and IV daptomycin ), with end of treatment course to be 5/29 when patient will be seen in the ID clinic for follow up  Complex midline abdominal surgical wound Care per General Surgery with wound VAC currently being utilized -white foam is being placed under the fascia sutures in the superior aspect of the wound and black foam on top with dressing to be changed every Monday Wednesday Friday and vacuum placed at 125 mmHg  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 Drain culture growing this organism - antibiotic per ID recommendations, with plan to continue daptomycin  at LTAC - unasyn  changed to augemtin by ID prior to d/c - to be seen in IR clinic 2 weeks after d/c for f/u drain imaging and management   Acute kidney failure - resolving  Required CRRT during initial portion of hospital stay -due to combination of septic shock leading to ATN as well as possible IV contrast injury -did not require initiation of intermittent HD - renal function is steadily improving and expected to normalize   Acute blood loss anemia with anemia of critical illness - iron  and B12 deficiencies Transfused as needed to keep hemoglobin 7.0 or > - hemoglobin holding steady   Septic shock - hypotension - resolved  Required vasopressors during initial portion of hospital stay -blood pressure is now  stable without vasopressor support  History of PE October 2024 Anticoagulation held initially with BRBPR - has now been transitioned back to warfarin with IV heparin  bridge completed   Acute hypoxic respiratory  failure due to intra-abdominal catastrophe and septic shock with cardiopulmonary arrest Required short-term intubation - extubated successfully 4/23 - oxygen saturations now stable on room air  Encephalopathy - ICU induced delirium Resolved with patient now having returned to his baseline mental status  Acute right knee pain - resolved  Plain film x-ray unrevealing - uric acid normal - resolved at this time    Family Communication: No family present at time of exam Disposition: Medically stable for LTAC stay when arrangements completed   Objective: Blood pressure 133/87, pulse 91, temperature 98.6 F (37 C), temperature source Axillary, resp. rate 18, height 5\' 9"  (1.753 m), weight 74.2 kg, SpO2 100%.  Intake/Output Summary (Last 24 hours) at 02/09/2024 1045 Last data filed at 02/09/2024 0200 Gross per 24 hour  Intake 368 ml  Output 550 ml  Net -182 ml   Filed Weights   02/03/24 0628 02/04/24 0401 02/05/24 0508  Weight: 75.6 kg 74 kg 74.2 kg    Examination: General: No acute respiratory distress Lungs: Clear to auscultation bilaterally Cardiovascular: Regular rate and rhythm without murmur  Abdomen: Soft, wound VAC in place, bowel sounds present peripherally, no rebound Extremities: No signif edema bilateral lower extremities  CBC: Recent Labs  Lab 02/07/24 0907 02/08/24 1023 02/09/24 0217  WBC 10.1 11.2* 12.2*  HGB 8.0* 8.3* 7.5*  HCT 25.3* 27.1* 24.7*  MCV 92.7 97.1 95.7  PLT 272 285 319   Basic Metabolic Panel: Recent Labs  Lab 02/03/24 0340 02/04/24 0423 02/04/24 0500 02/05/24 0440 02/06/24 0557 02/07/24 0907 02/09/24 0217  NA 141  138 140 141 138 137 137 135  K 4.2  4.1 4.4 4.4 4.4 4.3 4.3 4.7  CL 110  108 110 109 107 108 110 107  CO2 21*  21* 23 24 23  21* 20* 21*  GLUCOSE 123*  125* 109* 116* 95 113* 124* 93  BUN 54*  54* 44* 43* 35* 25* 17 14  CREATININE 3.15*  3.05* 2.41* 2.40* 2.34* 2.15* 1.96* 1.52*  CALCIUM  8.1*  7.9* 8.1* 8.2* 8.2*  8.1* 8.2* 8.2*  MG 1.9 1.8  --   --  1.8  --   --   PHOS 3.2  3.3 4.1 4.5 4.2 4.5  --   --    GFR: Estimated Creatinine Clearance: 51 mL/min (A) (by C-G formula based on SCr of 1.52 mg/dL (H)).   Scheduled Meds:  amoxicillin -clavulanate  1 tablet Oral Q12H   Chlorhexidine  Gluconate Cloth  6 each Topical Daily   diclofenac  Sodium  2 g Topical QID   feeding supplement  237 mL Oral BID BM   ferrous sulfate   325 mg Oral Q breakfast   loperamide   4 mg Oral QID   multivitamin with minerals  1 tablet Oral Daily   mouth rinse  15 mL Mouth Rinse 4 times per day   pantoprazole   40 mg Oral BID   polycarbophil  1,250 mg Oral BID   sodium chloride  flush  10-40 mL Intracatheter Q12H   sodium chloride  flush  5 mL Intracatheter Q8H   sucralfate   1 g Oral BID   warfarin  8.5 mg Oral ONCE-1600   Warfarin - Pharmacist Dosing Inpatient   Does not apply q1600   Continuous Infusions:  sodium chloride  Stopped (01/24/24 2213)   DAPTOmycin  Stopped (  02/08/24 1525)     LOS: 29 days   Abbe Abate, MD [facility] Office  [phone] Pager - Text Page per Amion  If 7PM-7AM, please contact night-coverage per Amion 02/09/2024, 10:45 AM

## 2024-02-09 NOTE — Plan of Care (Signed)
  Problem: Education: Goal: Knowledge of General Education information will improve Description: Including pain rating scale, medication(s)/side effects and non-pharmacologic comfort measures Outcome: Progressing   Problem: Health Behavior/Discharge Planning: Goal: Ability to manage health-related needs will improve Outcome: Progressing   Problem: Clinical Measurements: Goal: Ability to maintain clinical measurements within normal limits will improve Outcome: Progressing Goal: Will remain free from infection Outcome: Progressing Goal: Diagnostic test results will improve Outcome: Progressing Goal: Respiratory complications will improve Outcome: Progressing Goal: Cardiovascular complication will be avoided Outcome: Progressing   Problem: Nutrition: Goal: Adequate nutrition will be maintained Outcome: Progressing   Problem: Coping: Goal: Level of anxiety will decrease Outcome: Progressing   Problem: Elimination: Goal: Will not experience complications related to bowel motility Outcome: Progressing Goal: Will not experience complications related to urinary retention Outcome: Progressing   Problem: Pain Managment: Goal: General experience of comfort will improve and/or be controlled Outcome: Progressing   Problem: Safety: Goal: Ability to remain free from injury will improve Outcome: Progressing   Problem: Skin Integrity: Goal: Risk for impaired skin integrity will decrease Outcome: Progressing   Problem: Activity: Goal: Ability to tolerate increased activity will improve Outcome: Progressing   Problem: Respiratory: Goal: Ability to maintain a clear airway and adequate ventilation will improve Outcome: Progressing   Problem: Role Relationship: Goal: Method of communication will improve Outcome: Progressing   Problem: Education: Goal: Ability to describe self-care measures that may prevent or decrease complications (Diabetes Survival Skills Education) will  improve Outcome: Progressing Goal: Individualized Educational Video(s) Outcome: Progressing   Problem: Coping: Goal: Ability to adjust to condition or change in health will improve Outcome: Progressing   Problem: Fluid Volume: Goal: Ability to maintain a balanced intake and output will improve Outcome: Progressing   Problem: Health Behavior/Discharge Planning: Goal: Ability to identify and utilize available resources and services will improve Outcome: Progressing Goal: Ability to manage health-related needs will improve Outcome: Progressing   Problem: Metabolic: Goal: Ability to maintain appropriate glucose levels will improve Outcome: Progressing   Problem: Nutritional: Goal: Maintenance of adequate nutrition will improve Outcome: Progressing Goal: Progress toward achieving an optimal weight will improve Outcome: Progressing   Problem: Skin Integrity: Goal: Risk for impaired skin integrity will decrease Outcome: Progressing   Problem: Tissue Perfusion: Goal: Adequacy of tissue perfusion will improve Outcome: Progressing

## 2024-02-09 NOTE — Progress Notes (Addendum)
 PHARMACY - ANTICOAGULATION CONSULT NOTE  Pharmacy Consult for Warfarin Indication: history of PE and DVT  No Known Allergies  Patient Measurements: Height: 5\' 9"  (175.3 cm) Weight: 74.2 kg (163 lb 9.3 oz) IBW/kg (Calculated) : 70.7 HEPARIN  DW (KG): 78.1  Vital Signs: Temp: 98.6 F (37 C) (05/11 0545) Temp Source: Axillary (05/11 0545) BP: 126/78 (05/11 0545) Pulse Rate: 87 (05/11 0545)  Labs: Recent Labs    02/06/24 1723 02/07/24 0907 02/07/24 0907 02/08/24 1023 02/08/24 1208 02/09/24 0217  HGB  --  8.0*   < > 8.3*  --  7.5*  HCT  --  25.3*  --  27.1*  --  24.7*  PLT  --  272  --  285  --  319  LABPROT  --  23.2*  --   --  23.2* 25.4*  INR  --  2.0*  --   --  2.0* 2.3*  HEPARINUNFRC 0.35 0.65  --   --   --   --   CREATININE  --  1.96*  --   --   --  1.52*   < > = values in this interval not displayed.    Estimated Creatinine Clearance: 51 mL/min (A) (by C-G formula based on SCr of 1.52 mg/dL (H)).  Assessment: 85 YOM who presented to the ED on 01/11/24 with rectal bleeding, hypotension. On warfarin PTA for hx PE and DVT.  Reversed and held on admit. Pt had ongoing rectal bleeding requiring transfusions, taken to OR on 4/16 for ex lap, right colectomy, abscess drainage, and placement of wound vac. Hospitalization complicated by hemorrhagic and septic shock requiring massive transfusion protocol.  Last transfusion 4/30.  Anticoagulation resumed 5/2 with IV heparin , low therapeutic goal. Warfarin resumed 02/04/24.  PTA warfarin regimen: 10 mg daily (he was stable on this dose [based on clinic records] February 2025 through April 2025.   5/11 AM update: INR 2.3 Hgb 7.5 PLT wnl No signs of bleeding per nursing report from overnight crew He is eating 100% of meals since 5/9 (varied from 20-100% prior)  Goal of Therapy:  INR  2-3 Monitor platelets by anticoagulation protocol: Yes   Plan:  Warfarin 8.5 mg po x1 today Daily PT/INR/CBC Monitor for signs/symptoms of  bleeding.  Marleta Simmer BS, PharmD, BCPS Clinical Pharmacist 02/09/2024 7:25 AM  Contact: [phone] after 3 PM  "Be curious, not judgmental..." -Rumalda Counter

## 2024-02-10 DIAGNOSIS — N179 Acute kidney failure, unspecified: Secondary | ICD-10-CM | POA: Diagnosis not present

## 2024-02-10 DIAGNOSIS — Z432 Encounter for attention to ileostomy: Secondary | ICD-10-CM | POA: Diagnosis not present

## 2024-02-10 DIAGNOSIS — B998 Other infectious disease: Secondary | ICD-10-CM | POA: Diagnosis not present

## 2024-02-10 DIAGNOSIS — Z8674 Personal history of sudden cardiac arrest: Secondary | ICD-10-CM | POA: Diagnosis not present

## 2024-02-10 DIAGNOSIS — T8149XA Infection following a procedure, other surgical site, initial encounter: Secondary | ICD-10-CM | POA: Diagnosis not present

## 2024-02-10 DIAGNOSIS — S31109A Unspecified open wound of abdominal wall, unspecified quadrant without penetration into peritoneal cavity, initial encounter: Secondary | ICD-10-CM | POA: Diagnosis not present

## 2024-02-10 DIAGNOSIS — R5381 Other malaise: Secondary | ICD-10-CM | POA: Diagnosis not present

## 2024-02-10 DIAGNOSIS — R41841 Cognitive communication deficit: Secondary | ICD-10-CM | POA: Diagnosis not present

## 2024-02-10 DIAGNOSIS — R531 Weakness: Secondary | ICD-10-CM | POA: Diagnosis not present

## 2024-02-10 DIAGNOSIS — M109 Gout, unspecified: Secondary | ICD-10-CM | POA: Diagnosis not present

## 2024-02-10 DIAGNOSIS — B952 Enterococcus as the cause of diseases classified elsewhere: Secondary | ICD-10-CM | POA: Diagnosis not present

## 2024-02-10 DIAGNOSIS — I739 Peripheral vascular disease, unspecified: Secondary | ICD-10-CM | POA: Diagnosis not present

## 2024-02-10 DIAGNOSIS — R109 Unspecified abdominal pain: Secondary | ICD-10-CM | POA: Diagnosis not present

## 2024-02-10 DIAGNOSIS — K659 Peritonitis, unspecified: Secondary | ICD-10-CM | POA: Diagnosis not present

## 2024-02-10 DIAGNOSIS — R0989 Other specified symptoms and signs involving the circulatory and respiratory systems: Secondary | ICD-10-CM | POA: Diagnosis not present

## 2024-02-10 DIAGNOSIS — T8131XA Disruption of external operation (surgical) wound, not elsewhere classified, initial encounter: Secondary | ICD-10-CM | POA: Diagnosis not present

## 2024-02-10 DIAGNOSIS — I96 Gangrene, not elsewhere classified: Secondary | ICD-10-CM | POA: Diagnosis not present

## 2024-02-10 DIAGNOSIS — K631 Perforation of intestine (nontraumatic): Secondary | ICD-10-CM | POA: Diagnosis not present

## 2024-02-10 DIAGNOSIS — A419 Sepsis, unspecified organism: Secondary | ICD-10-CM | POA: Diagnosis not present

## 2024-02-10 DIAGNOSIS — T8189XA Other complications of procedures, not elsewhere classified, initial encounter: Secondary | ICD-10-CM | POA: Diagnosis not present

## 2024-02-10 DIAGNOSIS — K219 Gastro-esophageal reflux disease without esophagitis: Secondary | ICD-10-CM | POA: Diagnosis not present

## 2024-02-10 DIAGNOSIS — G8928 Other chronic postprocedural pain: Secondary | ICD-10-CM | POA: Diagnosis not present

## 2024-02-10 DIAGNOSIS — R1313 Dysphagia, pharyngeal phase: Secondary | ICD-10-CM | POA: Diagnosis not present

## 2024-02-10 DIAGNOSIS — Z87891 Personal history of nicotine dependence: Secondary | ICD-10-CM | POA: Diagnosis not present

## 2024-02-10 DIAGNOSIS — R262 Difficulty in walking, not elsewhere classified: Secondary | ICD-10-CM | POA: Diagnosis not present

## 2024-02-10 DIAGNOSIS — I10 Essential (primary) hypertension: Secondary | ICD-10-CM | POA: Diagnosis not present

## 2024-02-10 DIAGNOSIS — K651 Peritoneal abscess: Secondary | ICD-10-CM | POA: Diagnosis not present

## 2024-02-10 DIAGNOSIS — Z86711 Personal history of pulmonary embolism: Secondary | ICD-10-CM | POA: Diagnosis not present

## 2024-02-10 DIAGNOSIS — Z86718 Personal history of other venous thrombosis and embolism: Secondary | ICD-10-CM | POA: Diagnosis not present

## 2024-02-10 DIAGNOSIS — R300 Dysuria: Secondary | ICD-10-CM | POA: Diagnosis not present

## 2024-02-10 DIAGNOSIS — Z7401 Bed confinement status: Secondary | ICD-10-CM | POA: Diagnosis not present

## 2024-02-10 DIAGNOSIS — G4700 Insomnia, unspecified: Secondary | ICD-10-CM | POA: Diagnosis not present

## 2024-02-10 DIAGNOSIS — F1090 Alcohol use, unspecified, uncomplicated: Secondary | ICD-10-CM | POA: Diagnosis not present

## 2024-02-10 DIAGNOSIS — G894 Chronic pain syndrome: Secondary | ICD-10-CM | POA: Diagnosis not present

## 2024-02-10 DIAGNOSIS — M6281 Muscle weakness (generalized): Secondary | ICD-10-CM | POA: Diagnosis not present

## 2024-02-10 DIAGNOSIS — Z48815 Encounter for surgical aftercare following surgery on the digestive system: Secondary | ICD-10-CM | POA: Diagnosis not present

## 2024-02-10 DIAGNOSIS — I272 Pulmonary hypertension, unspecified: Secondary | ICD-10-CM | POA: Diagnosis not present

## 2024-02-10 DIAGNOSIS — I2699 Other pulmonary embolism without acute cor pulmonale: Secondary | ICD-10-CM | POA: Diagnosis not present

## 2024-02-10 DIAGNOSIS — T8130XA Disruption of wound, unspecified, initial encounter: Secondary | ICD-10-CM | POA: Diagnosis not present

## 2024-02-10 DIAGNOSIS — G8918 Other acute postprocedural pain: Secondary | ICD-10-CM | POA: Diagnosis not present

## 2024-02-10 DIAGNOSIS — Z1630 Resistance to unspecified antimicrobial drugs: Secondary | ICD-10-CM | POA: Diagnosis not present

## 2024-02-10 LAB — CBC
HCT: 24.5 % — ABNORMAL LOW (ref 39.0–52.0)
Hemoglobin: 7.6 g/dL — ABNORMAL LOW (ref 13.0–17.0)
MCH: 29.6 pg (ref 26.0–34.0)
MCHC: 31 g/dL (ref 30.0–36.0)
MCV: 95.3 fL (ref 80.0–100.0)
Platelets: 359 10*3/uL (ref 150–400)
RBC: 2.57 MIL/uL — ABNORMAL LOW (ref 4.22–5.81)
RDW: 17.1 % — ABNORMAL HIGH (ref 11.5–15.5)
WBC: 12.7 10*3/uL — ABNORMAL HIGH (ref 4.0–10.5)
nRBC: 0 % (ref 0.0–0.2)

## 2024-02-10 LAB — BASIC METABOLIC PANEL WITH GFR
Anion gap: 8 (ref 5–15)
BUN: 13 mg/dL (ref 8–23)
CO2: 20 mmol/L — ABNORMAL LOW (ref 22–32)
Calcium: 8.5 mg/dL — ABNORMAL LOW (ref 8.9–10.3)
Chloride: 106 mmol/L (ref 98–111)
Creatinine, Ser: 1.53 mg/dL — ABNORMAL HIGH (ref 0.61–1.24)
GFR, Estimated: 51 mL/min — ABNORMAL LOW (ref >60)
Glucose, Bld: 112 mg/dL — ABNORMAL HIGH (ref 70–99)
Potassium: 4.6 mmol/L (ref 3.5–5.1)
Sodium: 134 mmol/L — ABNORMAL LOW (ref 135–145)

## 2024-02-10 LAB — CK: Total CK: 59 U/L (ref 49–397)

## 2024-02-10 LAB — PROTIME-INR
INR: 2.9 — ABNORMAL HIGH (ref 0.8–1.2)
Prothrombin Time: 30.7 s — ABNORMAL HIGH (ref 11.4–15.2)

## 2024-02-10 MED ORDER — SODIUM CHLORIDE 0.9% FLUSH
10.0000 mL | INTRAVENOUS | Status: DC | PRN
Start: 2024-02-10 — End: 2024-04-01

## 2024-02-10 MED ORDER — SODIUM CHLORIDE 0.9 % IV SOLN: 10.0000 mg/kg | Freq: Every day | INTRAVENOUS | Status: DC

## 2024-02-10 MED ORDER — HYDROMORPHONE HCL 1 MG/ML IJ SOLN: 1.0000 mg | INTRAMUSCULAR | Status: DC | PRN

## 2024-02-10 MED ORDER — ENSURE ENLIVE PO LIQD: 237.0000 mL | Freq: Two times a day (BID) | ORAL | Status: DC

## 2024-02-10 MED ORDER — CALCIUM POLYCARBOPHIL 625 MG PO TABS: 1250.0000 mg | ORAL_TABLET | Freq: Two times a day (BID) | ORAL | Status: DC

## 2024-02-10 MED ORDER — WARFARIN SODIUM 7.5 MG PO TABS: 7.5000 mg | ORAL_TABLET | Freq: Every day | ORAL | Status: DC

## 2024-02-10 MED ORDER — SODIUM CHLORIDE 0.9% FLUSH
10.0000 mL | Freq: Two times a day (BID) | INTRAVENOUS | Status: DC
Start: 2024-02-10 — End: 2024-04-01

## 2024-02-10 MED ORDER — IPRATROPIUM-ALBUTEROL 0.5-2.5 (3) MG/3ML IN SOLN: 3.0000 mL | RESPIRATORY_TRACT | Status: DC | PRN

## 2024-02-10 MED ORDER — HEPARIN SOD (PORK) LOCK FLUSH 100 UNIT/ML IV SOLN
250.0000 [IU] | INTRAVENOUS | Status: AC | PRN
  Administered 2024-02-10: 250 [IU]

## 2024-02-10 MED ORDER — DICLOFENAC SODIUM 1 % EX GEL: 2.0000 g | Freq: Four times a day (QID) | CUTANEOUS | Status: DC

## 2024-02-10 MED ORDER — OXYCODONE HCL 5 MG/5ML PO SOLN: 5.0000 mg | ORAL | Status: DC | PRN

## 2024-02-10 MED ORDER — ADULT MULTIVITAMIN W/MINERALS CH: 1.0000 | ORAL_TABLET | Freq: Every day | ORAL | Status: DC

## 2024-02-10 MED ORDER — FERROUS SULFATE 325 (65 FE) MG PO TABS: 325.0000 mg | ORAL_TABLET | Freq: Every day | ORAL | Status: DC

## 2024-02-10 MED ORDER — WARFARIN SODIUM 7.5 MG PO TABS
7.5000 mg | ORAL_TABLET | Freq: Once | ORAL | Status: AC
  Administered 2024-02-10: 7.5 mg via ORAL
  Filled 2024-02-10: qty 1

## 2024-02-10 MED ORDER — LOPERAMIDE HCL 2 MG PO CAPS: 4.0000 mg | ORAL_CAPSULE | Freq: Four times a day (QID) | ORAL | Status: DC

## 2024-02-10 MED ORDER — ONDANSETRON HCL 4 MG/2ML IJ SOLN: 4.0000 mg | Freq: Four times a day (QID) | INTRAMUSCULAR | Status: DC | PRN

## 2024-02-10 MED ORDER — METHOCARBAMOL 1000 MG/10ML IJ SOLN: 500.0000 mg | Freq: Four times a day (QID) | INTRAMUSCULAR | Status: DC | PRN

## 2024-02-10 MED ORDER — ALUM & MAG HYDROXIDE-SIMETH 200-200-20 MG/5ML PO SUSP: 15.0000 mL | Freq: Four times a day (QID) | ORAL | Status: DC | PRN

## 2024-02-10 MED ORDER — PANTOPRAZOLE SODIUM 40 MG PO TBEC: 40.0000 mg | DELAYED_RELEASE_TABLET | Freq: Two times a day (BID) | ORAL | Status: DC

## 2024-02-10 MED ORDER — AMOXICILLIN-POT CLAVULANATE 875-125 MG PO TABS: 1.0000 | ORAL_TABLET | Freq: Two times a day (BID) | ORAL | Status: DC

## 2024-02-10 NOTE — Progress Notes (Signed)
 PT Cancellation Note  Patient Details Name: Alexander [name] MRN: [id number] DOB: 1961-04-10   Cancelled Treatment:    Reason Eval/Treat Not Completed: Other (comment) (pt getting ready to d/c to [facility]).   Amey Ka, PT  Acute Rehab Services Secure chat preferred Office [phone]    Sharman Debar 02/10/2024, 1:09 PM

## 2024-02-10 NOTE — TOC Progression Note (Signed)
 Transition of Care [facility]) - Progression Note    Patient Details  Name: Alexander [name] MRN: [id number] Date of Birth: [birth year]  [facility]) CM/SW Contact  Jeani Mill, RN Phone Number: 02/10/2024, 9:14 AM  Clinical Narrative:    DJ with [facility] started insurance auth.     Expected Discharge Plan: Long Term Acute Care (LTAC) Barriers to Discharge: Continued Medical Work up  Expected Discharge Plan and Services   Discharge Planning Services: CM Consult   Living arrangements for the past 2 months: Single Family Home                                       Social Determinants of Health (SDOH) Interventions SDOH Screenings   Food Insecurity: No Food Insecurity (01/12/2024)  Housing: Low Risk  (01/12/2024)  Transportation Needs: No Transportation Needs (01/12/2024)  Utilities: Not At Risk (01/12/2024)  Depression (PHQ2-9): Low Risk  (07/24/2023)  Tobacco Use: Medium Risk (01/15/2024)    Readmission Risk Interventions    01/24/2024   10:43 AM 01/13/2024    9:13 AM 07/11/2023    2:11 PM  Readmission Risk Prevention Plan  Post Dischage Appt   Complete  Medication Screening   Complete  Transportation Screening Complete Complete Complete  PCP or Specialist Appt within 3-5 Days Complete Complete   HRI or Home Care Consult Complete Complete   Social Work Consult for Recovery Care Planning/Counseling Complete Complete   Palliative Care Screening Not Applicable Not Applicable   Medication Review Oceanographer) Complete Complete

## 2024-02-10 NOTE — Discharge Summary (Addendum)
 DISCHARGE SUMMARY  Alexander [name]  MR#: [id number]  DOB:07-22-1961  Date of Admission: 01/11/2024 Date of Discharge: 02/10/2024  Attending Physician:Braulio Kiedrowski Constantine Delude, MD  Patient's WUJ:[id number], [name], [name]  Disposition: D/C to [facility]  Follow-up Appts:  Follow-up Information     [name], MD Follow up on 03/05/2024.   Specialty: General Surgery Why: 4:30pm, Arrive 30 minutes prior to your appointment time, Please bring your insurance card and photo ID Contact information: 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 Workup ultimately revealed a perforated colon. Patient underwent an exploratory laparotomy followed by a stay in the ICU. During his hospitalization he developed acute kidney failure requiring CRRT with subsequent improvement in renal function prior to the initiation of intermittent dialysis. He continues on broad-spectrum antibiotics for his intra-abdominal and wound infections and is being followed by General Surgery and Infectious Disease.   Hospital Course: 4/12 admit with BRBPR 4/15 repeat CT suggest contained perforation of the ascending colon -General Surgery evaluated 4/16 exploratory  laparotomy -postop CODE BLUE 4/17 back to OR -small bowel resection and wound VAC placement 4/19 back to OR -exploratory laparotomy, ileostomy creation, small bowel resection, wound VAC placement -CRRT initiated 4/23 extubated -off pressors 4/25 transfer from [facility] long ICU to [facility] progressive care for possible HD 4/29 triple-lumen CVC placement for TPN 4/30 CT-guided drain placement abdominal abscess -HD catheter placement 5/2 diet advanced to clears 5/5 diet advanced to regular 5/10 RUE PICC placed  5/12 D/C to [facility]  Spontaneous colon perforation status post right colectomy Has required multiple follow-up surgeries as noted above - now status post right hemicolectomy and partial small bowel resection with ileostomy creation - wound VAC in place - now off TPN and able to support himself with oral intake alone - broad-spectrum antibiotic as per ID (oral augmentin  and IV daptomycin ) with end of treatment course to be 5/29 when patient will be seen in the ID clinic for follow up   Complex midline abdominal surgical wound Care per General Surgery with wound VAC currently being utilized - white foam is being placed under the fascia sutures in the superior aspect of the wound and black foam on top with dressing to be changed every Monday Wednesday Friday and vacuum placed at 125 mmHg   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 Drain culture growing this organism - antibiotic per ID recommendations, with plan to continue daptomycin  at [facility] - unasyn  changed to augemtin by ID prior to d/c - to be seen in IR clinic 2 weeks after d/c for f/u drain imaging and management    Acute kidney failure - resolving  Required CRRT during initial portion of hospital stay -due to combination of septic shock leading to ATN as well as possible IV contrast injury - did not require  initiation of intermittent HD - renal function has improved greatly with creatinine now stable at approximately  1.5   Acute blood loss anemia with anemia of critical illness - iron  and B12 deficiencies Transfused as needed to keep hemoglobin 7.0 or > - hemoglobin holding steady    Septic shock - hypotension - resolved  Required vasopressors during initial portion of hospital stay - blood pressure is now stable without vasopressor support   History of PE October 2024 Anticoagulation held initially with BRBPR - has now been transitioned back to warfarin with IV heparin  bridge completed    Acute hypoxic respiratory failure due to intra-abdominal catastrophe and septic shock with cardiopulmonary arrest Required short-term intubation - extubated successfully 4/23 - oxygen saturations now stable on room air   Encephalopathy - ICU induced delirium Resolved with patient now having returned to his baseline mental status   Acute right knee pain - resolved  Plain film x-ray unrevealing - uric acid normal - resolved at this time   Allergies as of 02/10/2024   No Known Allergies      Medication List     STOP taking these medications    acetaminophen  325 MG tablet Commonly known as: TYLENOL    mupirocin  ointment 2 % Commonly known as: BACTROBAN    polyethylene glycol powder 17 GM/SCOOP powder Commonly known as: GLYCOLAX /MIRALAX        TAKE these medications    alum & mag hydroxide-simeth 200-200-20 MG/5ML suspension Commonly known as: MAALOX/MYLANTA Take 15 mLs by mouth every 6 (six) hours as needed for indigestion or heartburn.   amoxicillin -clavulanate 875-125 MG tablet Commonly known as: AUGMENTIN  Take 1 tablet by mouth every 12 (twelve) hours. Stop 02/27/24   DAPTOmycin  750 mg in sodium chloride  0.9 % 50 mL Inject 750 mg into the vein daily at 2 PM. Stop 02/27/24- Please check CK on weekly basis   diclofenac  Sodium 1 % Gel Commonly known as: VOLTAREN  Apply 2 g topically 4 (four) times daily.   feeding supplement Liqd Take 237 mLs by mouth 2 (two) times daily between meals.    ferrous sulfate  325 (65 FE) MG tablet Take 1 tablet (325 mg total) by mouth daily with breakfast. Start taking on: Feb 11, 2024   HYDROmorphone  1 MG/ML injection Commonly known as: DILAUDID  Inject 1 mL (1 mg total) into the vein every 4 (four) hours as needed for severe pain (pain score 7-10).   ipratropium-albuterol  0.5-2.5 (3) MG/3ML Soln Commonly known as: DUONEB Take 3 mLs by nebulization every 4 (four) hours as needed.   loperamide  2 MG capsule Commonly known as: IMODIUM  Take 2 capsules (4 mg total) by mouth 4 (four) times daily.   methocarbamol  1000 MG/10ML injection Commonly known as: ROBAXIN  Inject 5 mLs (500 mg total) into the vein every 6 (six) hours as needed (pain).   multivitamin with minerals Tabs tablet Take 1 tablet by mouth daily. Start taking on: Feb 11, 2024   ondansetron  4 MG/2ML Soln injection Commonly known as: ZOFRAN  Inject 2 mLs (4 mg total) into the vein every 6 (six) hours as needed for nausea or vomiting.   oxyCODONE  5 MG/5ML solution Commonly known as: ROXICODONE  Take 5-10 mLs (5-10 mg total) by mouth every 4 (four) hours as needed for moderate pain (pain score 4-6).   pantoprazole  40 MG tablet Commonly known as: PROTONIX  Take 1 tablet (40 mg total) by mouth 2 (two) times daily.   polycarbophil 625 MG tablet Commonly known as: FIBERCON Take 2 tablets (1,250 mg total) by  mouth 2 (two) times daily.   sodium chloride  flush 0.9 % Soln Commonly known as: NS 10-40 mLs by Intracatheter route every 12 (twelve) hours.   sodium chloride  flush 0.9 % Soln Commonly known as: NS 10-40 mLs by Intracatheter route as needed (flush).   warfarin 7.5 MG tablet Commonly known as: COUMADIN  Take as directed. If you are unsure how to take this medication, talk to your nurse or doctor. Original instructions: Take 1 tablet (7.5 mg total) by mouth daily at 4 PM. What changed:  medication strength how much to take how to take this when to take  this additional instructions        Day of Discharge BP 111/69 (BP Location: Left Arm)   Pulse 95   Temp 99.2 F (37.3 C) (Oral)   Resp 17   Ht 5\' 9"  (1.753 m)   Wt 74.2 kg   SpO2 99%   BMI 24.16 kg/m   Physical Exam: General: No acute respiratory distress Lungs: Clear to auscultation bilaterally without wheezes or crackles Cardiovascular: Regular rate and rhythm without murmur gallop or rub normal S1 and S2 Abdomen: Drain and wound VAC and ostomy in place - dressings dry - abdom benign - no mass or rebound  Extremities: No significant cyanosis, clubbing, or edema bilateral lower extremities  Basic Metabolic Panel: Recent Labs  Lab 02/04/24 0423 02/04/24 0500 02/05/24 0440 02/06/24 0557 02/07/24 0907 02/09/24 0217 02/10/24 0315  NA 140 141 138 137 137 135 134*  K 4.4 4.4 4.4 4.3 4.3 4.7 4.6  CL 110 109 107 108 110 107 106  CO2 23 24 23  21* 20* 21* 20*  GLUCOSE 109* 116* 95 113* 124* 93 112*  BUN 44* 43* 35* 25* 17 14 13   CREATININE 2.41* 2.40* 2.34* 2.15* 1.96* 1.52* 1.53*  CALCIUM  8.1* 8.2* 8.2* 8.1* 8.2* 8.2* 8.5*  MG 1.8  --   --  1.8  --   --   --   PHOS 4.1 4.5 4.2 4.5  --   --   --     CBC: Recent Labs  Lab 02/06/24 0557 02/07/24 0907 02/08/24 1023 02/09/24 0217 02/10/24 0315  WBC 10.4 10.1 11.2* 12.2* 12.7*  HGB 7.6* 8.0* 8.3* 7.5* 7.6*  HCT 23.8* 25.3* 27.1* 24.7* 24.5*  MCV 93.3 92.7 97.1 95.7 95.3  PLT 214 272 285 319 359     Time spent in discharge (includes decision making & examination of pt): 35 minutes  02/10/2024, 12:44 PM   Abbe Abate, MD [facility] Office  [phone]

## 2024-02-10 NOTE — TOC Transition Note (Signed)
 Transition of Care [facility]) - Discharge Note   Patient Details  Name: Alexander [name] MRN: [id number] Date of Birth: [birth year]  Transition [facility] CM/SW Contact:  Jeani Mill, RN Phone Number: 02/10/2024, 12:50 PM   Clinical Narrative:    Patient stable for discharge to LTAC, [facility]. Bed 322, report [phone], Dr. Delpha Fickle for handoff [phone]. Notified Son, Lovella Rubin, of discharge.     Final next level of care: Long Term Acute Care (LTAC) Barriers to Discharge: Barriers Resolved   Patient Goals and CMS Choice Patient states their goals for this hospitalization and ongoing recovery are:: Home CMS Medicare.gov Compare Post Acute Care list provided to:: Patient Choice offered to / list presented to : Patient [place] ownership interest in [facility].provided to:: Patient    Discharge Placement               LTAC        Discharge Plan and Services Additional resources added to the After Visit Summary for     Discharge Planning Services: CM Consult                                 Social Drivers of Health (SDOH) Interventions SDOH Screenings   Food Insecurity: No Food Insecurity (01/12/2024)  Housing: Low Risk  (01/12/2024)  Transportation Needs: No Transportation Needs (01/12/2024)  Utilities: Not At Risk (01/12/2024)  Depression (PHQ2-9): Low Risk  (07/24/2023)  Tobacco Use: Medium Risk (01/15/2024)     Readmission Risk Interventions    01/24/2024   10:43 AM 01/13/2024    9:13 AM 07/11/2023    2:11 PM  Readmission Risk Prevention Plan  Post Dischage Appt   Complete  Medication Screening   Complete  Transportation Screening Complete Complete Complete  PCP or Specialist Appt within 3-5 Days Complete Complete   HRI or Home Care Consult Complete Complete   Social Work Consult for Recovery Care Planning/Counseling Complete Complete   Palliative Care Screening Not Applicable Not Applicable   Medication Review Oceanographer)  Complete Complete

## 2024-02-10 NOTE — Progress Notes (Signed)
 Progress Note  23 Days Post-Op  Subjective: NAEO. Tolerating PO and ostomy working well.   Objective: Vital signs in last 24 hours: Temp:  [98 F (36.7 C)-99.2 F (37.3 C)] 99.2 F (37.3 C) (05/12 0753) Pulse Rate:  [82-97] 95 (05/12 0753) Resp:  [16-17] 17 (05/12 0753) BP: (111-126)/(69-75) 111/69 (05/12 0753) SpO2:  [99 %-100 %] 99 % (05/12 0753) Last BM Date : 02/09/24  Intake/Output from previous day: 05/11 0701 - 05/12 0700 In: 600 [P.O.:600] Out: 4135 [Urine:2900; Drains:135; Stool:1100] Intake/Output this shift: No intake/output data recorded.  PE: Gen: awake, alert Abd: Soft, appropriately tender. IR drain still old bloody appearance. midline wound much improved.  Good superficial granulation and good granulation starting to form over bowel.     Lab Results: Recent Labs    02/09/24 0217 02/10/24 0315  WBC 12.2* 12.7*  HGB 7.5* 7.6*  HCT 24.7* 24.5*  PLT 319 359   BMET Recent Labs    02/09/24 0217 02/10/24 0315  NA 135 134*  K 4.7 4.6  CL 107 106  CO2 21* 20*  GLUCOSE 93 112*  BUN 14 13  CREATININE 1.52* 1.53*  CALCIUM  8.2* 8.5*   PT/INR Recent Labs    02/09/24 0217 02/10/24 0315  LABPROT 25.4* 30.7*  INR 2.3* 2.9*   CMP     Component Value Date/Time   NA 134 (L) 02/10/2024 0315   NA 141 01/22/2020 1137   K 4.6 02/10/2024 0315   CL 106 02/10/2024 0315   CO2 20 (L) 02/10/2024 0315   GLUCOSE 112 (H) 02/10/2024 0315   BUN 13 02/10/2024 0315   BUN 13 01/22/2020 1137   CREATININE 1.53 (H) 02/10/2024 0315   CREATININE 0.98 06/14/2017 0935   CALCIUM  8.5 (L) 02/10/2024 0315   PROT 6.4 (L) 02/06/2024 0557   PROT 7.3 01/22/2020 1137   ALBUMIN  <1.5 (L) 02/06/2024 0557   ALBUMIN  4.2 01/22/2020 1137   AST 33 02/06/2024 0557   ALT 41 02/06/2024 0557   ALKPHOS 72 02/06/2024 0557   BILITOT 0.6 02/06/2024 0557   BILITOT 0.4 01/22/2020 1137   GFRNONAA 51 (L) 02/10/2024 0315   GFRNONAA >89 09/17/2014 1253   GFRAA 111 01/22/2020 1137    GFRAA >89 09/17/2014 1253   Lipase     Component Value Date/Time   LIPASE 22 09/28/2019 1109       Studies/Results: No results found.   Anti-infectives: Anti-infectives (From admission, onward)    Start     Dose/Rate Route Frequency Ordered Stop   02/06/24 2200  amoxicillin -clavulanate (AUGMENTIN ) 875-125 MG per tablet 1 tablet        1 tablet Oral Every 12 hours 02/06/24 1602 02/27/24 2359   02/05/24 1400  DAPTOmycin  (CUBICIN ) 800 mg in sodium chloride  0.9 % IVPB        10 mg/kg  76.5 kg 132 mL/hr over 30 Minutes Intravenous Daily 02/05/24 0758 02/27/24 2359   02/05/24 0900  Ampicillin -Sulbactam (UNASYN ) 3 g in sodium chloride  0.9 % 100 mL IVPB  Status:  Discontinued        3 g 200 mL/hr over 30 Minutes Intravenous Every 6 hours 02/05/24 0758 02/06/24 1602   02/03/24 2000  DAPTOmycin  (CUBICIN ) 800 mg in sodium chloride  0.9 % IVPB  Status:  Discontinued        10 mg/kg  76.5 kg 132 mL/hr over 30 Minutes Intravenous Every 48 hours 02/03/24 0801 02/05/24 0758   02/02/24 1230  Ampicillin -Sulbactam (UNASYN ) 3 g in sodium chloride  0.9 %  100 mL IVPB  Status:  Discontinued        3 g 200 mL/hr over 30 Minutes Intravenous Every 12 hours 02/02/24 1208 02/05/24 0758   02/01/24 2000  DAPTOmycin  (CUBICIN ) IVPB 500 mg/50mL premix  Status:  Discontinued        6 mg/kg  76.5 kg 100 mL/hr over 30 Minutes Intravenous Every 48 hours 02/01/24 1853 02/03/24 0801   01/31/24 2200  linezolid  (ZYVOX ) IVPB 600 mg  Status:  Discontinued        600 mg 300 mL/hr over 60 Minutes Intravenous Every 12 hours 01/31/24 2036 02/01/24 1853   01/28/24 1000  piperacillin -tazobactam (ZOSYN ) IVPB 2.25 g  Status:  Discontinued        2.25 g 100 mL/hr over 30 Minutes Intravenous Every 8 hours 01/28/24 0909 02/02/24 1208   01/18/24 1400  piperacillin -tazobactam (ZOSYN ) IVPB 3.375 g  Status:  Discontinued        3.375 g 12.5 mL/hr over 240 Minutes Intravenous Every 6 hours 01/18/24 1241 01/18/24 1242   01/18/24  1400  piperacillin -tazobactam (ZOSYN ) IVPB 3.375 g        3.375 g 100 mL/hr over 30 Minutes Intravenous Every 6 hours 01/18/24 1242 01/23/24 2046   01/18/24 0000  piperacillin -tazobactam (ZOSYN ) IVPB 2.25 g  Status:  Discontinued        2.25 g 100 mL/hr over 30 Minutes Intravenous Every 6 hours 01/17/24 2120 01/18/24 1241   01/12/24 0200  piperacillin -tazobactam (ZOSYN ) IVPB 3.375 g  Status:  Discontinued        3.375 g 12.5 mL/hr over 240 Minutes Intravenous Every 8 hours 01/11/24 2120 01/17/24 2120   01/11/24 1815  piperacillin -tazobactam (ZOSYN ) IVPB 3.375 g        3.375 g 100 mL/hr over 30 Minutes Intravenous  Once 01/11/24 1806 01/11/24 1921        Assessment/Plan  LAPAROTOMY, EXPLORATORY RIGHT COLECTOMY (without anastomosis) PLACEMENT OF ABTHERA WOUND VAC DRAINAGE OF INTRA-ABDOMINAL ABSCESS  4/16 EW   RE-EXPLORATION ABDOMEN, PLACEMENT OF ABTHERA WOUND VAC, SMALL BOWEL RESECTION 01/16/2024 EW   Re-exploration abdomen, small bowel resection, creation of end ileostomy, abd closure, placement wound vac 4/19 EW   -  CT scan was certainly concerning for leak given presumed extravasation of contrast noted in this fluid collection however, drain is bloody right now with no succus noted despite FLD. Will allow regular diet today and monitor.  Can start to wean TNA - IR drain placed in suspect abscess/leak 4/30-output has been bloody but not purulent or enteric.  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  Currently on Unasyn  and Cubicin , per ID - Cont to monitor ostomy output - increased imodium  and fiber, increased imodium  to max dose 16mg /daily 5/7 due to output around 1.5L, down to around 1.1L - fascial dehiscence without evisceration.  This appears well scarred in for now.  VAC placed 5/6 and wound is improving. Continue VAC for now.  Small pieces of white sponge to 3 little areas and black sponge on top.  Discussed with primary team and [facility]) will accept white foam  dressing. -regular diet, Ensure -surgical JP removed 5/8     FEN - regular diet, Ensure, imodium , fiber VTE - SCDs, heparin  gtt, coumadin  ID - Completed post op abx. reinititiation of Zosyn  on 4/29, changed to zyvox  5/2, changed to unasyn /Cubicin  5/4 (per ID)   - Per [facility] -  Pleural effusions Septic shock - resolved ABL anemia - monitor and transfuse as needed, hgb stable,  no active or acute bleeding Coagulopathy - resolved AKI - started CRRT 4/19,  defer to renal.  Holding HD as UOP is good and Cr improving Acute hypoxemic resp failure - extubated H/o VTE/PE/DVT - scds for now; was on coumadin    LOS: 30 days      Charlott Converse, [facility] Surgery 02/10/2024, 10:24 AM Please see Amion for pager number during day hours 7:00am-4:30pm

## 2024-02-10 NOTE — Plan of Care (Signed)
  Problem: Education: Goal: Knowledge of General Education information will improve Description: Including pain rating scale, medication(s)/side effects and non-pharmacologic comfort measures Outcome: Progressing   Problem: Health Behavior/Discharge Planning: Goal: Ability to manage health-related needs will improve Outcome: Progressing   Problem: Clinical Measurements: Goal: Ability to maintain clinical measurements within normal limits will improve Outcome: Progressing Goal: Will remain free from infection Outcome: Progressing Goal: Diagnostic test results will improve Outcome: Progressing Goal: Respiratory complications will improve Outcome: Progressing Goal: Cardiovascular complication will be avoided Outcome: Progressing   Problem: Nutrition: Goal: Adequate nutrition will be maintained Outcome: Progressing   Problem: Coping: Goal: Level of anxiety will decrease Outcome: Progressing   Problem: Elimination: Goal: Will not experience complications related to bowel motility Outcome: Progressing Goal: Will not experience complications related to urinary retention Outcome: Progressing   Problem: Pain Managment: Goal: General experience of comfort will improve and/or be controlled Outcome: Progressing   Problem: Safety: Goal: Ability to remain free from injury will improve Outcome: Progressing   Problem: Skin Integrity: Goal: Risk for impaired skin integrity will decrease Outcome: Progressing   Problem: Activity: Goal: Ability to tolerate increased activity will improve Outcome: Progressing   Problem: Respiratory: Goal: Ability to maintain a clear airway and adequate ventilation will improve Outcome: Progressing   Problem: Role Relationship: Goal: Method of communication will improve Outcome: Progressing   Problem: Education: Goal: Ability to describe self-care measures that may prevent or decrease complications (Diabetes Survival Skills Education) will  improve Outcome: Progressing Goal: Individualized Educational Video(s) Outcome: Progressing   Problem: Coping: Goal: Ability to adjust to condition or change in health will improve Outcome: Progressing   Problem: Fluid Volume: Goal: Ability to maintain a balanced intake and output will improve Outcome: Progressing   Problem: Health Behavior/Discharge Planning: Goal: Ability to identify and utilize available resources and services will improve Outcome: Progressing Goal: Ability to manage health-related needs will improve Outcome: Progressing   Problem: Metabolic: Goal: Ability to maintain appropriate glucose levels will improve Outcome: Progressing   Problem: Nutritional: Goal: Maintenance of adequate nutrition will improve Outcome: Progressing Goal: Progress toward achieving an optimal weight will improve Outcome: Progressing   Problem: Skin Integrity: Goal: Risk for impaired skin integrity will decrease Outcome: Progressing   Problem: Tissue Perfusion: Goal: Adequacy of tissue perfusion will improve Outcome: Progressing

## 2024-02-10 NOTE — Consult Note (Signed)
 WOC Nurse wound follow up Wound type: surgical wound, seen with PA to evaluate wound status status. Measurement: see 5/6 note Wound bed: improved, granulation tissue present, wound near umbilicus with yellow slough.  Drainage (amount, consistency, odor) serosanguinous in cannister  Periwound: intact  Dressing procedure/placement/frequency: Removed old NPWT dressing ( 3 pieces white foam and 1 piece black foam)   Filled wound with  __3__ piece of white foam, ___1_ piece of black foam  Sealed NPWT dressing at HG Patient received IV pain medication per bedside nurse prior to dressing change Patient tolerated procedure well.  WOC nurse will continue to provide NPWT dressing due to complexity of the dressing change.   WOC Nurse ostomy follow up Stoma type/location: RUQ, ileostomy  Stomal assessment/size:  pink, moist, see prior notes for size. Peristomal assessment: intact Output: green liquid stool  Ostomy pouching: 1pc. Convex with barrier ring applied Education provided: patient declines to view stoma or learn ostomy care, report he is going to LTAC and then plans to have nurses at home to manage, reports no family available to educate.     Gillermo Lack, RN, MSN, [facility] WOC Team

## 2024-02-10 NOTE — Progress Notes (Signed)
 PHARMACY - ANTICOAGULATION CONSULT NOTE  Pharmacy Consult for Warfarin Indication: history of PE and DVT  No Known Allergies  Patient Measurements: Height: 5\' 9"  (175.3 cm) Weight: 74.2 kg (163 lb 9.3 oz) IBW/kg (Calculated) : 70.7 HEPARIN  DW (KG): 78.1  Vital Signs: Temp: 99.2 F (37.3 C) (05/12 0753) Temp Source: Oral (05/12 0753) BP: 111/69 (05/12 0753) Pulse Rate: 95 (05/12 0753)  Labs: Recent Labs    02/08/24 1023 02/08/24 1208 02/09/24 0217 02/10/24 0315  HGB 8.3*  --  7.5* 7.6*  HCT 27.1*  --  24.7* 24.5*  PLT 285  --  319 359  LABPROT  --  23.2* 25.4* 30.7*  INR  --  2.0* 2.3* 2.9*  CREATININE  --   --  1.52* 1.53*  CKTOTAL  --   --   --  59    Estimated Creatinine Clearance: 50.7 mL/min (A) (by C-G formula based on SCr of 1.53 mg/dL (H)).  Assessment: 89 YOM who presented to the ED on 01/11/24 with rectal bleeding, hypotension. On warfarin PTA for hx PE and DVT.  Reversed and held on admit. Pt had ongoing rectal bleeding requiring transfusions, taken to OR on 4/16 for ex lap, right colectomy, abscess drainage, and placement of wound vac. Hospitalization complicated by hemorrhagic and septic shock requiring massive transfusion protocol.  Last transfusion 4/30.  Anticoagulation resumed 5/2 with IV heparin , low therapeutic goal. Warfarin resumed 02/04/24.  PTA warfarin regimen: 10 mg daily (he was stable on this dose [based on clinic records] February 2025 through April 2025.   5/12 AM update: INR 2.9 Hgb 7.6 PLT wnl No signs of bleeding reported  He is eating 100% of meals since 5/12 AM (varied from 20-100% prior)  Goal of Therapy:  INR  2-3 Monitor platelets by anticoagulation protocol: Yes   Plan:  Warfarin 7.5 mg po x1 today Daily PT/INR/CBC Monitor for signs/symptoms of bleeding.   Thank you for allowing pharmacy to be part of this patients care team. Alisa Irish, RPh Clinical Pharmacist  02/10/2024 12:03 PM

## 2024-02-11 DIAGNOSIS — I2699 Other pulmonary embolism without acute cor pulmonale: Secondary | ICD-10-CM

## 2024-02-11 DIAGNOSIS — S31109A Unspecified open wound of abdominal wall, unspecified quadrant without penetration into peritoneal cavity, initial encounter: Secondary | ICD-10-CM

## 2024-02-11 DIAGNOSIS — K651 Peritoneal abscess: Secondary | ICD-10-CM

## 2024-02-13 LAB — AEROBIC/ANAEROBIC CULTURE W GRAM STAIN (SURGICAL/DEEP WOUND)

## 2024-02-14 DIAGNOSIS — G894 Chronic pain syndrome: Secondary | ICD-10-CM | POA: Diagnosis not present

## 2024-02-14 DIAGNOSIS — N179 Acute kidney failure, unspecified: Secondary | ICD-10-CM | POA: Diagnosis not present

## 2024-02-14 DIAGNOSIS — K659 Peritonitis, unspecified: Secondary | ICD-10-CM | POA: Diagnosis not present

## 2024-02-14 DIAGNOSIS — I10 Essential (primary) hypertension: Secondary | ICD-10-CM | POA: Diagnosis not present

## 2024-02-14 DIAGNOSIS — I739 Peripheral vascular disease, unspecified: Secondary | ICD-10-CM | POA: Diagnosis not present

## 2024-02-14 DIAGNOSIS — G8928 Other chronic postprocedural pain: Secondary | ICD-10-CM | POA: Diagnosis not present

## 2024-02-14 DIAGNOSIS — R531 Weakness: Secondary | ICD-10-CM | POA: Diagnosis not present

## 2024-02-14 DIAGNOSIS — R5381 Other malaise: Secondary | ICD-10-CM | POA: Diagnosis not present

## 2024-02-14 LAB — MINIMUM INHIBITORY CONC. (1 DRUG)

## 2024-02-14 LAB — MIC RESULT

## 2024-02-15 DIAGNOSIS — R5381 Other malaise: Secondary | ICD-10-CM | POA: Diagnosis not present

## 2024-02-15 DIAGNOSIS — K659 Peritonitis, unspecified: Secondary | ICD-10-CM | POA: Diagnosis not present

## 2024-02-15 DIAGNOSIS — N179 Acute kidney failure, unspecified: Secondary | ICD-10-CM | POA: Diagnosis not present

## 2024-02-16 DIAGNOSIS — N179 Acute kidney failure, unspecified: Secondary | ICD-10-CM | POA: Diagnosis not present

## 2024-02-16 DIAGNOSIS — K659 Peritonitis, unspecified: Secondary | ICD-10-CM | POA: Diagnosis not present

## 2024-02-16 DIAGNOSIS — R5381 Other malaise: Secondary | ICD-10-CM | POA: Diagnosis not present

## 2024-02-19 DIAGNOSIS — R300 Dysuria: Secondary | ICD-10-CM

## 2024-02-27 ENCOUNTER — Inpatient Hospital Stay: Payer: Self-pay | Admitting: Internal Medicine

## 2024-03-04 DIAGNOSIS — K9409 Other complications of colostomy: Secondary | ICD-10-CM | POA: Diagnosis not present

## 2024-03-04 DIAGNOSIS — R41841 Cognitive communication deficit: Secondary | ICD-10-CM | POA: Diagnosis not present

## 2024-03-04 DIAGNOSIS — Z7401 Bed confinement status: Secondary | ICD-10-CM | POA: Diagnosis not present

## 2024-03-04 DIAGNOSIS — Z48815 Encounter for surgical aftercare following surgery on the digestive system: Secondary | ICD-10-CM | POA: Diagnosis not present

## 2024-03-04 DIAGNOSIS — R1313 Dysphagia, pharyngeal phase: Secondary | ICD-10-CM | POA: Diagnosis not present

## 2024-03-04 DIAGNOSIS — R791 Abnormal coagulation profile: Secondary | ICD-10-CM | POA: Diagnosis not present

## 2024-03-04 DIAGNOSIS — R109 Unspecified abdominal pain: Secondary | ICD-10-CM | POA: Diagnosis present

## 2024-03-04 DIAGNOSIS — G8928 Other chronic postprocedural pain: Secondary | ICD-10-CM | POA: Diagnosis not present

## 2024-03-04 DIAGNOSIS — R1084 Generalized abdominal pain: Secondary | ICD-10-CM | POA: Diagnosis not present

## 2024-03-04 DIAGNOSIS — Z432 Encounter for attention to ileostomy: Secondary | ICD-10-CM | POA: Diagnosis present

## 2024-03-04 DIAGNOSIS — Z7901 Long term (current) use of anticoagulants: Secondary | ICD-10-CM | POA: Diagnosis not present

## 2024-03-04 DIAGNOSIS — I96 Gangrene, not elsewhere classified: Secondary | ICD-10-CM | POA: Diagnosis not present

## 2024-03-04 DIAGNOSIS — K5790 Diverticulosis of intestine, part unspecified, without perforation or abscess without bleeding: Secondary | ICD-10-CM | POA: Diagnosis not present

## 2024-03-04 DIAGNOSIS — Z7189 Other specified counseling: Secondary | ICD-10-CM | POA: Diagnosis present

## 2024-03-04 DIAGNOSIS — R69 Illness, unspecified: Secondary | ICD-10-CM | POA: Diagnosis not present

## 2024-03-04 DIAGNOSIS — I2699 Other pulmonary embolism without acute cor pulmonale: Secondary | ICD-10-CM | POA: Diagnosis not present

## 2024-03-04 DIAGNOSIS — T8149XA Infection following a procedure, other surgical site, initial encounter: Secondary | ICD-10-CM | POA: Diagnosis not present

## 2024-03-04 DIAGNOSIS — I739 Peripheral vascular disease, unspecified: Secondary | ICD-10-CM | POA: Diagnosis not present

## 2024-03-04 DIAGNOSIS — R531 Weakness: Secondary | ICD-10-CM | POA: Diagnosis not present

## 2024-03-04 DIAGNOSIS — K651 Peritoneal abscess: Secondary | ICD-10-CM | POA: Diagnosis not present

## 2024-03-04 DIAGNOSIS — R188 Other ascites: Secondary | ICD-10-CM | POA: Diagnosis not present

## 2024-03-04 DIAGNOSIS — M6281 Muscle weakness (generalized): Secondary | ICD-10-CM | POA: Diagnosis not present

## 2024-03-04 DIAGNOSIS — K631 Perforation of intestine (nontraumatic): Secondary | ICD-10-CM | POA: Diagnosis present

## 2024-03-04 DIAGNOSIS — I700 Atherosclerosis of aorta: Secondary | ICD-10-CM | POA: Diagnosis not present

## 2024-03-04 DIAGNOSIS — Z433 Encounter for attention to colostomy: Secondary | ICD-10-CM | POA: Diagnosis not present

## 2024-03-04 DIAGNOSIS — T8189XA Other complications of procedures, not elsewhere classified, initial encounter: Secondary | ICD-10-CM | POA: Diagnosis not present

## 2024-03-04 DIAGNOSIS — K828 Other specified diseases of gallbladder: Secondary | ICD-10-CM | POA: Diagnosis not present

## 2024-03-04 DIAGNOSIS — S31109A Unspecified open wound of abdominal wall, unspecified quadrant without penetration into peritoneal cavity, initial encounter: Secondary | ICD-10-CM | POA: Diagnosis not present

## 2024-03-04 DIAGNOSIS — L24B3 Irritant contact dermatitis related to fecal or urinary stoma or fistula: Secondary | ICD-10-CM | POA: Diagnosis not present

## 2024-03-04 DIAGNOSIS — Z932 Ileostomy status: Secondary | ICD-10-CM | POA: Diagnosis not present

## 2024-03-04 DIAGNOSIS — Z4682 Encounter for fitting and adjustment of non-vascular catheter: Secondary | ICD-10-CM | POA: Diagnosis present

## 2024-03-04 DIAGNOSIS — I10 Essential (primary) hypertension: Secondary | ICD-10-CM | POA: Diagnosis not present

## 2024-03-04 DIAGNOSIS — K5730 Diverticulosis of large intestine without perforation or abscess without bleeding: Secondary | ICD-10-CM | POA: Diagnosis not present

## 2024-03-04 DIAGNOSIS — Z4801 Encounter for change or removal of surgical wound dressing: Secondary | ICD-10-CM | POA: Diagnosis not present

## 2024-03-04 DIAGNOSIS — Z4889 Encounter for other specified surgical aftercare: Secondary | ICD-10-CM | POA: Diagnosis present

## 2024-03-04 DIAGNOSIS — R1030 Lower abdominal pain, unspecified: Secondary | ICD-10-CM | POA: Diagnosis not present

## 2024-03-04 DIAGNOSIS — G894 Chronic pain syndrome: Secondary | ICD-10-CM | POA: Diagnosis not present

## 2024-03-04 DIAGNOSIS — N3289 Other specified disorders of bladder: Secondary | ICD-10-CM | POA: Diagnosis not present

## 2024-03-04 DIAGNOSIS — R262 Difficulty in walking, not elsewhere classified: Secondary | ICD-10-CM | POA: Diagnosis not present

## 2024-03-04 DIAGNOSIS — R300 Dysuria: Secondary | ICD-10-CM | POA: Diagnosis not present

## 2024-03-04 DIAGNOSIS — K219 Gastro-esophageal reflux disease without esophagitis: Secondary | ICD-10-CM | POA: Diagnosis not present

## 2024-03-04 DIAGNOSIS — N179 Acute kidney failure, unspecified: Secondary | ICD-10-CM | POA: Diagnosis not present

## 2024-03-07 ENCOUNTER — Encounter (HOSPITAL_COMMUNITY): Payer: Self-pay | Admitting: Emergency Medicine

## 2024-03-07 ENCOUNTER — Emergency Department (HOSPITAL_COMMUNITY)
Admission: EM | Admit: 2024-03-07 | Discharge: 2024-03-07 | Disposition: A | Discharge status: Home / Self Care | Source: Home / Self Care | Attending: Emergency Medicine | Admitting: Emergency Medicine

## 2024-03-07 ENCOUNTER — Other Ambulatory Visit: Payer: Self-pay

## 2024-03-07 ENCOUNTER — Emergency Department (HOSPITAL_COMMUNITY)
Admission: EM | Admit: 2024-03-07 | Discharge: 2024-03-07 | Disposition: A | Discharge status: Skilled Nursing Facility | Attending: Emergency Medicine | Admitting: Emergency Medicine

## 2024-03-07 ENCOUNTER — Emergency Department (HOSPITAL_COMMUNITY)

## 2024-03-07 DIAGNOSIS — K5790 Diverticulosis of intestine, part unspecified, without perforation or abscess without bleeding: Secondary | ICD-10-CM | POA: Diagnosis not present

## 2024-03-07 DIAGNOSIS — R1084 Generalized abdominal pain: Secondary | ICD-10-CM | POA: Diagnosis not present

## 2024-03-07 DIAGNOSIS — K828 Other specified diseases of gallbladder: Secondary | ICD-10-CM | POA: Diagnosis not present

## 2024-03-07 DIAGNOSIS — R531 Weakness: Secondary | ICD-10-CM | POA: Diagnosis not present

## 2024-03-07 DIAGNOSIS — R109 Unspecified abdominal pain: Secondary | ICD-10-CM | POA: Diagnosis not present

## 2024-03-07 DIAGNOSIS — I10 Essential (primary) hypertension: Secondary | ICD-10-CM | POA: Diagnosis not present

## 2024-03-07 DIAGNOSIS — Z4889 Encounter for other specified surgical aftercare: Secondary | ICD-10-CM | POA: Diagnosis present

## 2024-03-07 DIAGNOSIS — Z7401 Bed confinement status: Secondary | ICD-10-CM | POA: Diagnosis not present

## 2024-03-07 DIAGNOSIS — Z7901 Long term (current) use of anticoagulants: Secondary | ICD-10-CM | POA: Insufficient documentation

## 2024-03-07 DIAGNOSIS — R1030 Lower abdominal pain, unspecified: Secondary | ICD-10-CM | POA: Diagnosis not present

## 2024-03-07 DIAGNOSIS — N3289 Other specified disorders of bladder: Secondary | ICD-10-CM | POA: Diagnosis not present

## 2024-03-07 DIAGNOSIS — Z5189 Encounter for other specified aftercare: Secondary | ICD-10-CM

## 2024-03-07 DIAGNOSIS — Z4801 Encounter for change or removal of surgical wound dressing: Secondary | ICD-10-CM | POA: Diagnosis not present

## 2024-03-07 LAB — CBC WITH DIFFERENTIAL/PLATELET
Abs Immature Granulocytes: 0.03 10*3/uL (ref 0.00–0.07)
Basophils Absolute: 0 10*3/uL (ref 0.0–0.1)
Basophils Relative: 1 %
Eosinophils Absolute: 0 10*3/uL (ref 0.0–0.5)
Eosinophils Relative: 0 %
HCT: 30 % — ABNORMAL LOW (ref 39.0–52.0)
Hemoglobin: 9.2 g/dL — ABNORMAL LOW (ref 13.0–17.0)
Immature Granulocytes: 1 %
Lymphocytes Relative: 25 %
Lymphs Abs: 1.6 10*3/uL (ref 0.7–4.0)
MCH: 29.8 pg (ref 26.0–34.0)
MCHC: 30.7 g/dL (ref 30.0–36.0)
MCV: 97.1 fL (ref 80.0–100.0)
Monocytes Absolute: 0.7 10*3/uL (ref 0.1–1.0)
Monocytes Relative: 11 %
Neutro Abs: 4.1 10*3/uL (ref 1.7–7.7)
Neutrophils Relative %: 62 %
Platelets: 296 10*3/uL (ref 150–400)
RBC: 3.09 MIL/uL — ABNORMAL LOW (ref 4.22–5.81)
RDW: 18.4 % — ABNORMAL HIGH (ref 11.5–15.5)
WBC: 6.5 10*3/uL (ref 4.0–10.5)
nRBC: 0 % (ref 0.0–0.2)

## 2024-03-07 LAB — COMPREHENSIVE METABOLIC PANEL WITH GFR
ALT: 24 U/L (ref 0–44)
AST: 22 U/L (ref 15–41)
Albumin: 2.8 g/dL — ABNORMAL LOW (ref 3.5–5.0)
Alkaline Phosphatase: 83 U/L (ref 38–126)
Anion gap: 6 (ref 5–15)
BUN: 9 mg/dL (ref 8–23)
CO2: 23 mmol/L (ref 22–32)
Calcium: 9.2 mg/dL (ref 8.9–10.3)
Chloride: 106 mmol/L (ref 98–111)
Creatinine, Ser: 0.8 mg/dL (ref 0.61–1.24)
GFR, Estimated: >60 mL/min (ref >60)
Glucose, Bld: 90 mg/dL (ref 70–99)
Potassium: 4 mmol/L (ref 3.5–5.1)
Sodium: 135 mmol/L (ref 135–145)
Total Bilirubin: 0.6 mg/dL (ref 0.0–1.2)
Total Protein: 7.6 g/dL (ref 6.5–8.1)

## 2024-03-07 LAB — URINALYSIS, W/ REFLEX TO CULTURE (INFECTION SUSPECTED)
Bacteria, UA: NONE SEEN
Bilirubin Urine: NEGATIVE
Glucose, UA: NEGATIVE mg/dL
Hgb urine dipstick: NEGATIVE
Ketones, ur: NEGATIVE mg/dL
Leukocytes,Ua: NEGATIVE
Nitrite: NEGATIVE
Protein, ur: NEGATIVE mg/dL
Specific Gravity, Urine: 1.031 — ABNORMAL HIGH (ref 1.005–1.030)
pH: 5 (ref 5.0–8.0)

## 2024-03-07 LAB — I-STAT CHEM 8, ED
BUN: 8 mg/dL (ref 8–23)
Calcium, Ion: 1.3 mmol/L (ref 1.15–1.40)
Chloride: 106 mmol/L (ref 98–111)
Creatinine, Ser: 0.8 mg/dL (ref 0.61–1.24)
Glucose, Bld: 91 mg/dL (ref 70–99)
HCT: 29 % — ABNORMAL LOW (ref 39.0–52.0)
Hemoglobin: 9.9 g/dL — ABNORMAL LOW (ref 13.0–17.0)
Potassium: 4.1 mmol/L (ref 3.5–5.1)
Sodium: 138 mmol/L (ref 135–145)
TCO2: 22 mmol/L (ref 22–32)

## 2024-03-07 LAB — LIPASE, BLOOD: Lipase: 36 U/L (ref 11–51)

## 2024-03-07 LAB — I-STAT CG4 LACTIC ACID, ED: Lactic Acid, Venous: 0.8 mmol/L (ref 0.5–1.9)

## 2024-03-07 MED ORDER — IOHEXOL 300 MG/ML  SOLN
100.0000 mL | Freq: Once | INTRAMUSCULAR | Status: AC | PRN
  Administered 2024-03-07: 100 mL via INTRAVENOUS

## 2024-03-07 MED ORDER — ACETAMINOPHEN 500 MG PO TABS
1000.0000 mg | ORAL_TABLET | Freq: Four times a day (QID) | ORAL | Status: DC | PRN
  Administered 2024-03-07: 1000 mg via ORAL
  Filled 2024-03-07: qty 2

## 2024-03-07 MED ORDER — HYDROMORPHONE HCL 1 MG/ML IJ SOLN
0.5000 mg | INTRAMUSCULAR | Status: DC | PRN
  Administered 2024-03-07: 0.5 mg via INTRAVENOUS
  Filled 2024-03-07: qty 1

## 2024-03-07 MED ORDER — SODIUM CHLORIDE 0.9 % IV BOLUS
1000.0000 mL | Freq: Once | INTRAVENOUS | Status: AC
  Administered 2024-03-07: 1000 mL via INTRAVENOUS

## 2024-03-07 MED ORDER — OXYCODONE HCL 5 MG PO TABS
5.0000 mg | ORAL_TABLET | Freq: Once | ORAL | Status: AC
  Administered 2024-03-07: 5 mg via ORAL
  Filled 2024-03-07: qty 1

## 2024-03-07 NOTE — ED Notes (Signed)
 Changed pts abdominal dressing. Abdominal area was pink with some purulent exudate around L side of wound, placed gauze and abd pad and tape on wound. Changed ostomy bag again and reinforced it and drain area

## 2024-03-07 NOTE — ED Notes (Signed)
 Asked provider for pain meds per pt request

## 2024-03-07 NOTE — Discharge Instructions (Signed)
 Continue taking your medications as prescribed.  Use the ostomy bags that we have sent you home with.  Follow-up with your general surgeon.

## 2024-03-07 NOTE — ED Provider Notes (Signed)
 [place] EMERGENCY DEPARTMENT AT [facility] Provider Note   CSN: [id number] Arrival date & time: 03/07/24  1431     History  Chief Complaint  Patient presents with   Wound Check    Pt arrives BIB EMS today for same as yesterday. It appears pts colostomy is ill fitting and leaks. Has prior wound midline abdomen dressed and appears dry at this time     Alexander [name] is a 63 y.o. male.  63 year old male here today because he had some leaking around his ostomy site.  He was seen here yesterday for the same.   Wound Check       Home Medications Prior to Admission medications   Medication Sig Start Date End Date Taking? Authorizing Provider  alum & mag hydroxide-simeth (MAALOX/MYLANTA) 200-200-20 MG/5ML suspension Take 15 mLs by mouth every 6 (six) hours as needed for indigestion or heartburn. 02/10/24   Abbe Abate, MD  amoxicillin -clavulanate (AUGMENTIN ) 875-125 MG tablet Take 1 tablet by mouth every 12 (twelve) hours. Stop 02/27/24 02/10/24   Abbe Abate, MD  DAPTOmycin  750 mg in sodium chloride  0.9 % 50 mL Inject 750 mg into the vein daily at 2 PM. Stop 02/27/24- Please check CK on weekly basis 02/10/24   Abbe Abate, MD  diclofenac  Sodium (VOLTAREN ) 1 % GEL Apply 2 g topically 4 (four) times daily. 02/10/24   Abbe Abate, MD  feeding supplement (ENSURE ENLIVE / ENSURE PLUS) LIQD Take 237 mLs by mouth 2 (two) times daily between meals. 02/10/24   Abbe Abate, MD  ferrous sulfate  325 (65 FE) MG tablet Take 1 tablet (325 mg total) by mouth daily with breakfast. 02/11/24   Abbe Abate, MD  HYDROmorphone  (DILAUDID ) 1 MG/ML injection Inject 1 mL (1 mg total) into the vein every 4 (four) hours as needed for severe pain (pain score 7-10). 02/10/24   Abbe Abate, MD  ipratropium-albuterol  (DUONEB) 0.5-2.5 (3) MG/3ML SOLN Take 3 mLs by nebulization every 4 (four) hours as needed. 02/10/24   Abbe Abate, MD  loperamide  (IMODIUM ) 2  MG capsule Take 2 capsules (4 mg total) by mouth 4 (four) times daily. 02/10/24   Abbe Abate, MD  methocarbamol  (ROBAXIN ) 1000 MG/10ML injection Inject 5 mLs (500 mg total) into the vein every 6 (six) hours as needed (pain). 02/10/24   Abbe Abate, MD  Multiple Vitamin (MULTIVITAMIN WITH MINERALS) TABS tablet Take 1 tablet by mouth daily. 02/11/24   Abbe Abate, MD  ondansetron  (ZOFRAN ) 4 MG/2ML SOLN injection Inject 2 mLs (4 mg total) into the vein every 6 (six) hours as needed for nausea or vomiting. 02/10/24   Abbe Abate, MD  oxyCODONE  (ROXICODONE ) 5 MG/5ML solution Take 5-10 mLs (5-10 mg total) by mouth every 4 (four) hours as needed for moderate pain (pain score 4-6). 02/10/24   Abbe Abate, MD  pantoprazole  (PROTONIX ) 40 MG tablet Take 1 tablet (40 mg total) by mouth 2 (two) times daily. 02/10/24   Abbe Abate, MD  polycarbophil (FIBERCON) 625 MG tablet Take 2 tablets (1,250 mg total) by mouth 2 (two) times daily. 02/10/24   Abbe Abate, MD  sodium chloride  flush (NS) 0.9 % SOLN 10-40 mLs by Intracatheter route every 12 (twelve) hours. 02/10/24   Abbe Abate, MD  sodium chloride  flush (NS) 0.9 % SOLN 10-40 mLs by Intracatheter route as needed (flush). 02/10/24   Abbe Abate, MD  warfarin (COUMADIN ) 7.5 MG  tablet Take 1 tablet (7.5 mg total) by mouth daily at 4 PM. 02/10/24   Abbe Abate, MD      Allergies    Patient has no known allergies.    Review of Systems   Review of Systems  Physical Exam Updated Vital Signs BP 122/75 (BP Location: Right Arm)   Pulse 77   Temp 97.8 F (36.6 C) (Oral)   Resp 18   SpO2 95%  Physical Exam Vitals and nursing note reviewed.  Abdominal:     General: Abdomen is flat. There is no distension.     Palpations: Abdomen is soft.     Comments: Well-appearing ostomy in the right lower quadrant.  Bag placed, no drainage.  Healing midline wound, no pus or purulence.  No odor.  Neurological:      Mental Status: He is alert.     ED Results / Procedures / Treatments   Labs (all labs ordered are listed, but only abnormal results are displayed) Labs Reviewed - No data to display  EKG None  Radiology CT ABDOMEN PELVIS W CONTRAST Result Date: 03/07/2024 CLINICAL DATA:  Acute abdominal pain EXAM: CT ABDOMEN AND PELVIS WITH CONTRAST TECHNIQUE: Multidetector CT imaging of the abdomen and pelvis was performed using the standard protocol following bolus administration of intravenous contrast. RADIATION DOSE REDUCTION: This exam was performed according to the departmental dose-optimization program which includes automated exposure control, adjustment of the mA and/or kV according to patient size and/or use of iterative reconstruction technique. CONTRAST:  OMNIPAQUE  IOHEXOL  300 MG/ML  SOLN COMPARISON:  01/29/2024, 01/28/2024 FINDINGS: Lower chest: Mild bibasilar atelectatic changes are noted. Hepatobiliary: No focal liver abnormality is seen. No gallstones, gallbladder wall thickening, or biliary dilatation. Pancreas: Unremarkable. No pancreatic ductal dilatation or surrounding inflammatory changes. Spleen: Normal in size without focal abnormality. Adrenals/Urinary Tract: Adrenal glands are within normal limits. Kidneys show a normal enhancement pattern bilaterally. No renal calculi or obstructive changes are seen. Normal excretion is noted on delayed images. The bladder is partially distended. 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 Changes of diverticulosis are again noted. 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 No small bowel or gastric obstructive changes are seen. Vascular/Lymphatic: Aortic atherosclerosis. No enlarged abdominal or pelvic lymph nodes. Reproductive: Prostate is unremarkable. Other: Drainage catheter is again noted just above the bladder. Previously seen mixed attenuation fluid collection has resolved following the catheter placement. No new focal fluid collection  is seen. Musculoskeletal: No acute or significant osseous findings. IMPRESSION: Status post CT-guided drainage catheter placement with resolution of the large mixed attenuation pelvic fluid collection. 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 Mild bibasilar atelectasis. Electronically Signed   By: Violeta Grey M.D.   On: 03/07/2024 03:50    Procedures Procedures    Medications Ordered in ED Medications  oxyCODONE  (Oxy IR/ROXICODONE ) immediate release tablet 5 mg (has no administration in time range)    ED Course/ Medical Decision Making/ A&P                                 Medical Decision Making 63 year old male here today because his ostomy was leaking.  Plan-had nursing staff place patient's ostomy.  Observed, was having no leakage.  Provided the patient with the appropriate ostomy bags to go back to his skilled nursing facility.  Wound on patient's abdomen appears well-healing.  He is not having significant abdominal pain.  Ordered patient's home medications.  Will discharge.  Risk Prescription drug management.           Final Clinical Impression(s) / ED Diagnoses Final diagnoses:  Visit for wound check    Rx / DC Orders ED Discharge Orders     None         Afton Horse T, DO 03/07/24 1526

## 2024-03-07 NOTE — ED Notes (Signed)
 [facility] notified for transport

## 2024-03-07 NOTE — ED Notes (Signed)
 RN changed colostomy bag and cleaned and changed dressing of mid abdominal incision. No leaking at this time.

## 2024-03-07 NOTE — ED Notes (Signed)
 Changed pt's ostomy bag and emptied drain in abdomen, approx 25ml output cloudy brown smelly. Pts original ostomy output very malodorous. Pt had some redness/bleeding under lowest point of ostomy. Bleeding controlled, area wiped with wet washcloth and dried

## 2024-03-07 NOTE — ED Notes (Signed)
 Patient is resting comfortably.

## 2024-03-07 NOTE — ED Triage Notes (Signed)
 Patient BIB EMS from SNF [facility]) c/o abdominal pain x 4 hours. Per report patient colostomy bag keep leaking even changing it multiple times. Patient denies N/V. Patient denies fever.

## 2024-03-07 NOTE — ED Notes (Signed)
 [facility] called for transport.

## 2024-03-07 NOTE — ED Notes (Signed)
 [facility] 4 times. Each time the nurse hung up the phone.

## 2024-03-07 NOTE — ED Notes (Signed)
 Patient was calling for the nurse. Assumed care of patient. I was advised that the patient was ready for discharged. Walked in tot the room . Patient had stool looking leaking from is colostomy bag. States its been leaking since he was here and that why he came to the ER. Removed the dressing that was applied. Cleaned the stool from around the abdomen and colostomy site. New colostomy bag was placed on the patient. Wet to dry dressing was placed on the abdomen and covered with an ABD pad. Education was given to the patient as to why the dressing continues to leak. Patients diaper was also changed and new paper scrubs were given to the patient.
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
## 2024-03-08 ENCOUNTER — Encounter (HOSPITAL_COMMUNITY): Payer: Self-pay

## 2024-03-08 ENCOUNTER — Emergency Department (HOSPITAL_COMMUNITY)
Admission: EM | Admit: 2024-03-08 | Discharge: 2024-03-08 | Disposition: A | Discharge status: Home / Self Care | Attending: Emergency Medicine | Admitting: Emergency Medicine

## 2024-03-08 DIAGNOSIS — Z433 Encounter for attention to colostomy: Secondary | ICD-10-CM | POA: Diagnosis not present

## 2024-03-08 DIAGNOSIS — R69 Illness, unspecified: Secondary | ICD-10-CM | POA: Diagnosis not present

## 2024-03-08 DIAGNOSIS — R109 Unspecified abdominal pain: Secondary | ICD-10-CM | POA: Diagnosis not present

## 2024-03-08 DIAGNOSIS — K9409 Other complications of colostomy: Secondary | ICD-10-CM | POA: Diagnosis not present

## 2024-03-08 DIAGNOSIS — R791 Abnormal coagulation profile: Secondary | ICD-10-CM | POA: Diagnosis not present

## 2024-03-08 DIAGNOSIS — Z7401 Bed confinement status: Secondary | ICD-10-CM | POA: Diagnosis not present

## 2024-03-08 LAB — CBC WITH DIFFERENTIAL/PLATELET
Abs Immature Granulocytes: 0.02 10*3/uL (ref 0.00–0.07)
Basophils Absolute: 0 10*3/uL (ref 0.0–0.1)
Basophils Relative: 1 %
Eosinophils Absolute: 0 10*3/uL (ref 0.0–0.5)
Eosinophils Relative: 0 %
HCT: 30.9 % — ABNORMAL LOW (ref 39.0–52.0)
Hemoglobin: 9.4 g/dL — ABNORMAL LOW (ref 13.0–17.0)
Immature Granulocytes: 0 %
Lymphocytes Relative: 29 %
Lymphs Abs: 1.7 10*3/uL (ref 0.7–4.0)
MCH: 30 pg (ref 26.0–34.0)
MCHC: 30.4 g/dL (ref 30.0–36.0)
MCV: 98.7 fL (ref 80.0–100.0)
Monocytes Absolute: 1 10*3/uL (ref 0.1–1.0)
Monocytes Relative: 17 %
Neutro Abs: 3.1 10*3/uL (ref 1.7–7.7)
Neutrophils Relative %: 53 %
Platelets: 280 10*3/uL (ref 150–400)
RBC: 3.13 MIL/uL — ABNORMAL LOW (ref 4.22–5.81)
RDW: 18.6 % — ABNORMAL HIGH (ref 11.5–15.5)
WBC: 5.8 10*3/uL (ref 4.0–10.5)
nRBC: 0 % (ref 0.0–0.2)

## 2024-03-08 LAB — COMPREHENSIVE METABOLIC PANEL WITH GFR
ALT: 21 U/L (ref 0–44)
AST: 17 U/L (ref 15–41)
Albumin: 2.7 g/dL — ABNORMAL LOW (ref 3.5–5.0)
Alkaline Phosphatase: 64 U/L (ref 38–126)
Anion gap: 10 (ref 5–15)
BUN: 9 mg/dL (ref 8–23)
CO2: 17 mmol/L — ABNORMAL LOW (ref 22–32)
Calcium: 9.3 mg/dL (ref 8.9–10.3)
Chloride: 109 mmol/L (ref 98–111)
Creatinine, Ser: 0.88 mg/dL (ref 0.61–1.24)
GFR, Estimated: >60 mL/min (ref >60)
Glucose, Bld: 87 mg/dL (ref 70–99)
Potassium: 4 mmol/L (ref 3.5–5.1)
Sodium: 136 mmol/L (ref 135–145)
Total Bilirubin: 0.5 mg/dL (ref 0.0–1.2)
Total Protein: 7.1 g/dL (ref 6.5–8.1)

## 2024-03-08 LAB — PROTIME-INR
INR: 3 — ABNORMAL HIGH (ref 0.8–1.2)
Prothrombin Time: 31.4 s — ABNORMAL HIGH (ref 11.4–15.2)

## 2024-03-08 LAB — LIPASE, BLOOD: Lipase: 35 U/L (ref 11–51)

## 2024-03-08 MED ORDER — ACETAMINOPHEN 500 MG PO TABS
1000.0000 mg | ORAL_TABLET | Freq: Once | ORAL | Status: AC
  Administered 2024-03-08: 1000 mg via ORAL

## 2024-03-08 MED ORDER — OXYCODONE HCL 5 MG PO TABS
5.0000 mg | ORAL_TABLET | Freq: Once | ORAL | Status: DC
  Filled 2024-03-08: qty 1

## 2024-03-08 MED ORDER — ACETAMINOPHEN 500 MG PO TABS
ORAL_TABLET | ORAL | Status: AC
  Filled 2024-03-08: qty 2

## 2024-03-08 NOTE — ED Notes (Signed)
 [facility] called, first on the list

## 2024-03-08 NOTE — Discharge Instructions (Addendum)
 It appears you missed your surgery followup appointment on June 5. Follow-up with your general surgeon as well as the ostomy clinic.  Take your medications as prescribed.  Return to the ED with new or worsening symptoms.

## 2024-03-08 NOTE — ED Provider Notes (Signed)
 [place] EMERGENCY DEPARTMENT AT [facility] Provider Note   CSN: [id number] Arrival date & time: 03/08/24  4259     History  No chief complaint on file.   Colan Laymon is a 63 y.o. male.  Patient with a history of CHF, pulmonary embolism on Coumadin , DVT, recent intra-abdominal surgery requiring colon resection with ostomy creation here for the third time in the past 24 hours with leaking ostomy bag.  He was seen at [facility] x 2 yesterday.  He reports there is stool leaking from the skin site around the ostomy.  Nursing facility was not able to address this.  He has some mild abdominal pain but no fever.  No vomiting.  Denies any black or bloody stools.  Also has an open midline abdominal wound with wet-to-dry dressing and JP drain.  He states this is not leaking.  He is taking unknown antibiotic.  Reports his pain is unchanged.  Denies any chest pain or trouble breathing.  No fever.  He is concerned that the facility is not addressing his ostomy correctly.  States that only started leaking within the past day.  The history is provided by the patient and the EMS personnel.       Home Medications Prior to Admission medications   Medication Sig Start Date End Date Taking? Authorizing Provider  alum & mag hydroxide-simeth (MAALOX/MYLANTA) 200-200-20 MG/5ML suspension Take 15 mLs by mouth every 6 (six) hours as needed for indigestion or heartburn. 02/10/24   Abbe Abate, MD  amoxicillin -clavulanate (AUGMENTIN ) 875-125 MG tablet Take 1 tablet by mouth every 12 (twelve) hours. Stop 02/27/24 02/10/24   Abbe Abate, MD  DAPTOmycin  750 mg in sodium chloride  0.9 % 50 mL Inject 750 mg into the vein daily at 2 PM. Stop 02/27/24- Please check CK on weekly basis 02/10/24   Abbe Abate, MD  diclofenac  Sodium (VOLTAREN ) 1 % GEL Apply 2 g topically 4 (four) times daily. 02/10/24   Abbe Abate, MD  feeding supplement (ENSURE ENLIVE / ENSURE PLUS) LIQD Take 237 mLs by  mouth 2 (two) times daily between meals. 02/10/24   Abbe Abate, MD  ferrous sulfate  325 (65 FE) MG tablet Take 1 tablet (325 mg total) by mouth daily with breakfast. 02/11/24   Abbe Abate, MD  HYDROmorphone  (DILAUDID ) 1 MG/ML injection Inject 1 mL (1 mg total) into the vein every 4 (four) hours as needed for severe pain (pain score 7-10). 02/10/24   Abbe Abate, MD  ipratropium-albuterol  (DUONEB) 0.5-2.5 (3) MG/3ML SOLN Take 3 mLs by nebulization every 4 (four) hours as needed. 02/10/24   Abbe Abate, MD  loperamide  (IMODIUM ) 2 MG capsule Take 2 capsules (4 mg total) by mouth 4 (four) times daily. 02/10/24   Abbe Abate, MD  methocarbamol  (ROBAXIN ) 1000 MG/10ML injection Inject 5 mLs (500 mg total) into the vein every 6 (six) hours as needed (pain). 02/10/24   Abbe Abate, MD  Multiple Vitamin (MULTIVITAMIN WITH MINERALS) TABS tablet Take 1 tablet by mouth daily. 02/11/24   Abbe Abate, MD  ondansetron  (ZOFRAN ) 4 MG/2ML SOLN injection Inject 2 mLs (4 mg total) into the vein every 6 (six) hours as needed for nausea or vomiting. 02/10/24   Abbe Abate, MD  oxyCODONE  (ROXICODONE ) 5 MG/5ML solution Take 5-10 mLs (5-10 mg total) by mouth every 4 (four) hours as needed for moderate pain (pain score 4-6). 02/10/24   Abbe Abate, MD  pantoprazole  (  PROTONIX ) 40 MG tablet Take 1 tablet (40 mg total) by mouth 2 (two) times daily. 02/10/24   Abbe Abate, MD  polycarbophil (FIBERCON) 625 MG tablet Take 2 tablets (1,250 mg total) by mouth 2 (two) times daily. 02/10/24   Abbe Abate, MD  sodium chloride  flush (NS) 0.9 % SOLN 10-40 mLs by Intracatheter route every 12 (twelve) hours. 02/10/24   Abbe Abate, MD  sodium chloride  flush (NS) 0.9 % SOLN 10-40 mLs by Intracatheter route as needed (flush). 02/10/24   Abbe Abate, MD  warfarin (COUMADIN ) 7.5 MG tablet Take 1 tablet (7.5 mg total) by mouth daily at 4 PM. 02/10/24   Abbe Abate,  MD      Allergies    Patient has no known allergies.    Review of Systems   Review of Systems  Constitutional:  Negative for activity change, appetite change and fever.  HENT:  Negative for congestion and rhinorrhea.   Respiratory:  Negative for cough, chest tightness and shortness of breath.   Cardiovascular:  Negative for chest pain.  Gastrointestinal:  Positive for abdominal pain. Negative for nausea and vomiting.  Genitourinary:  Negative for dysuria and hematuria.  Musculoskeletal:  Negative for arthralgias and myalgias.  Neurological:  Negative for dizziness, weakness and headaches.   all other systems are negative except as noted in the HPI and PMH.    Physical Exam Updated Vital Signs BP 121/87   Pulse 84   Temp 97.9 F (36.6 C) (Oral)   Resp 18   Ht 5\' 9"  (1.753 m)   Wt 74.2 kg   SpO2 100%   BMI 24.16 kg/m  Physical Exam Vitals and nursing note reviewed.  Constitutional:      General: He is not in acute distress.    Appearance: He is well-developed. He is ill-appearing.     Comments: Pale conjunctiva  HENT:     Head: Normocephalic and atraumatic.     Mouth/Throat:     Pharynx: No oropharyngeal exudate.  Eyes:     Conjunctiva/sclera: Conjunctivae normal.     Pupils: Pupils are equal, round, and reactive to light.  Neck:     Comments: No meningismus. Cardiovascular:     Rate and Rhythm: Normal rate and regular rhythm.     Heart sounds: Normal heart sounds. No murmur heard. Pulmonary:     Effort: Pulmonary effort is normal. No respiratory distress.     Breath sounds: Normal breath sounds.  Abdominal:     Palpations: Abdomen is soft.     Tenderness: There is abdominal tenderness. There is no guarding or rebound.     Comments: Healing midline wound with wet-to-dry dressing.  There are some yellow discharge on the dressing. JP drain in place to right lower quadrant.  Ostomy to right lower quadrant with brown stool is leaking around the skin site.  No blood.   Musculoskeletal:        General: No tenderness. Normal range of motion.     Cervical back: Normal range of motion and neck supple.  Skin:    General: Skin is warm.  Neurological:     Mental Status: He is alert and oriented to person, place, and time.     Cranial Nerves: No cranial nerve deficit.     Motor: No abnormal muscle tone.     Coordination: Coordination normal.     Comments:  5/5 strength throughout. CN 2-12 intact.Equal grip strength.   Psychiatric:  Behavior: Behavior normal.        ED Results / Procedures / Treatments   Labs (all labs ordered are listed, but only abnormal results are displayed) Labs Reviewed  CBC WITH DIFFERENTIAL/PLATELET - Abnormal; Notable for the following components:      Result Value   RBC 3.13 (*)    Hemoglobin 9.4 (*)    HCT 30.9 (*)    RDW 18.6 (*)    All other components within normal limits  COMPREHENSIVE METABOLIC PANEL WITH GFR - Abnormal; Notable for the following components:   CO2 17 (*)    Albumin  2.7 (*)    All other components within normal limits  PROTIME-INR - Abnormal; Notable for the following components:   Prothrombin  Time 31.4 (*)    INR 3.0 (*)    All other components within normal limits  LIPASE, BLOOD    EKG None  Radiology CT ABDOMEN PELVIS W CONTRAST Result Date: 03/07/2024 CLINICAL DATA:  Acute abdominal pain EXAM: CT ABDOMEN AND PELVIS WITH CONTRAST TECHNIQUE: Multidetector CT imaging of the abdomen and pelvis was performed using the standard protocol following bolus administration of intravenous contrast. RADIATION DOSE REDUCTION: This exam was performed according to the departmental dose-optimization program which includes automated exposure control, adjustment of the mA and/or kV according to patient size and/or use of iterative reconstruction technique. CONTRAST:  OMNIPAQUE  IOHEXOL  300 MG/ML  SOLN COMPARISON:  01/29/2024, 01/28/2024 FINDINGS: Lower chest: Mild bibasilar atelectatic changes are  noted. Hepatobiliary: No focal liver abnormality is seen. No gallstones, gallbladder wall thickening, or biliary dilatation. Pancreas: Unremarkable. No pancreatic ductal dilatation or surrounding inflammatory changes. Spleen: Normal in size without focal abnormality. Adrenals/Urinary Tract: Adrenal glands are within normal limits. Kidneys show a normal enhancement pattern bilaterally. No renal calculi or obstructive changes are seen. Normal excretion is noted on delayed images. The bladder is partially distended. 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 Changes of diverticulosis are again noted. 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 No small bowel or gastric obstructive changes are seen. Vascular/Lymphatic: Aortic atherosclerosis. No enlarged abdominal or pelvic lymph nodes. Reproductive: Prostate is unremarkable. Other: Drainage catheter is again noted just above the bladder. Previously seen mixed attenuation fluid collection has resolved following the catheter placement. No new focal fluid collection is seen. Musculoskeletal: No acute or significant osseous findings. IMPRESSION: Status post CT-guided drainage catheter placement with resolution of the large mixed attenuation pelvic fluid collection. 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 Mild bibasilar atelectasis. Electronically Signed   By: Violeta Grey M.D.   On: 03/07/2024 03:50    Procedures Procedures    Medications Ordered in ED Medications  oxyCODONE  (Oxy IR/ROXICODONE ) immediate release tablet 5 mg (has no administration in time range)    ED Course/ Medical Decision Making/ A&P                                 Medical Decision Making Amount and/or Complexity of Data Reviewed Labs: ordered. Decision-making details documented in ED Course. Radiology: ordered and independent interpretation performed. Decision-making details documented in ED Course. ECG/medicine tests: ordered and independent  interpretation performed. Decision-making details documented in ED Course.  Risk OTC drugs. Prescription drug management.   Third visit in 24 hours with leaking ostomy.  Recent hospitalization for perforated colon.  Vitals are stable on arrival.  No distress.  Abdomen soft.  He is leaking stool around his ostomy.  Labs are stable.  Hemoglobin is at baseline.  Creatinine is at baseline. INR 3.0.  CT scan from yesterday reviewed.  Shows stable postsurgical changes without drainable fluid collection.  Dr. Lanell Pinta evaluated photograph of wound and feels that is healing appropriately and recommends continuing his Augmentin .  Also referred to ostomy clinic.  Patient missed his follow-up appointment in June 5 with Dr. Elvan Hamel.  Advised to reschedule this soon as possible.  He was not aware of this appointment.  Ostomy was replaced by nursing staff with good adherence to the skin.  Wet-to-dry dressing of midline incision was changed as well. Appropriate ostomy supplies given to patient to return to his facility.  He is informed to let his facility know that he missed his follow-up appointment on June 5 with Dr. Elvan Hamel and recommended to reschedule soon as possible.  Will also refer to ostomy clinic.  Return precautions discussed.        Final Clinical Impression(s) / ED Diagnoses Final diagnoses:  Colostomy care [facility])    Rx / DC Orders ED Discharge Orders     None         Sabriah Hobbins, Mara [place], MD 03/08/24 [phone]

## 2024-03-08 NOTE — ED Triage Notes (Signed)
 Pt BIB [facility] from [facility].  From a colostomy change.  [facility] has sent him out 3x in the last 24 hours.

## 2024-03-08 NOTE — ED Notes (Signed)
 Packed abdominal wound with wet to dry dressing per MD direction. Replaced colostomy bag using barrier putty.

## 2024-03-10 ENCOUNTER — Telehealth: Payer: Self-pay | Admitting: *Deleted

## 2024-03-10 DIAGNOSIS — Z7189 Other specified counseling: Secondary | ICD-10-CM

## 2024-03-10 NOTE — Telephone Encounter (Signed)
 Copied from CRM [id number]. Topic: Referral - Request for Referral >> Mar 10, 2024  8:40 AM Albertha Alosa wrote: Did the patient discuss referral with their provider in the last year? Yes (If No - schedule appointment) (If Yes - send message)  Appointment offered? Yes  Type of order/referral and detailed reason for visit: Ostomy   Preference of office, provider, location: [facility] Fax : [phone]  If referral order, have you been seen by this specialty before? No (If Yes, this issue or another issue? When? Where?  Can we respond through MyChart? Yes

## 2024-03-10 NOTE — Telephone Encounter (Signed)
 Spoke to pt told him referral has been placed for [facility], someone will contact you to schedule an appt. Pt said he is at [facility] facility does not drive. Looking further into chart told him you already have an appt scheduled for 6/17 and just needed referral sent over. Told him referral placed. Pt verbalized understanding. Message sent to [facility] and Referral put in Epic.

## 2024-03-17 ENCOUNTER — Other Ambulatory Visit (HOSPITAL_COMMUNITY): Payer: Self-pay | Admitting: Nurse Practitioner

## 2024-03-17 ENCOUNTER — Ambulatory Visit (HOSPITAL_COMMUNITY)
Admission: RE | Admit: 2024-03-17 | Discharge: 2024-03-17 | Disposition: A | Discharge status: Home / Self Care | Source: Ambulatory Visit | Attending: *Deleted | Admitting: *Deleted

## 2024-03-17 ENCOUNTER — Other Ambulatory Visit (HOSPITAL_COMMUNITY): Payer: Self-pay

## 2024-03-17 DIAGNOSIS — Z432 Encounter for attention to ileostomy: Secondary | ICD-10-CM | POA: Diagnosis not present

## 2024-03-17 DIAGNOSIS — L24B3 Irritant contact dermatitis related to fecal or urinary stoma or fistula: Secondary | ICD-10-CM

## 2024-03-17 DIAGNOSIS — Z7189 Other specified counseling: Secondary | ICD-10-CM | POA: Diagnosis not present

## 2024-03-17 DIAGNOSIS — K631 Perforation of intestine (nontraumatic): Secondary | ICD-10-CM

## 2024-03-17 DIAGNOSIS — T8189XS Other complications of procedures, not elsewhere classified, sequela: Secondary | ICD-10-CM

## 2024-03-17 NOTE — Discharge Instructions (Addendum)
 Patient needs 1 piece convex pouch.  ITEM # [id number], presized  Have placed in tapeless (coloplast) pouch to promote skin healing.  Cleanse skin and pat dry.  Apply stoma powder  ITem # [phone] Seal with skin prep (Item # [id number](  Apply piece of barrier ring to crease at 3:00 to make flat surface and remainder of ring around stoma.  (ITEM # [id number]) Ostomy belt ITEM #  [id number] BArrier strips to perimeter (ITEM # [id number])  See back MOnday 6/23 at 1:00
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
## 2024-03-18 ENCOUNTER — Telehealth (HOSPITAL_COMMUNITY): Payer: Self-pay

## 2024-03-18 ENCOUNTER — Other Ambulatory Visit (HOSPITAL_COMMUNITY): Payer: Self-pay

## 2024-03-18 DIAGNOSIS — K631 Perforation of intestine (nontraumatic): Secondary | ICD-10-CM

## 2024-03-18 NOTE — Telephone Encounter (Signed)
 Called [facility] to find out if the pt is able to stand and walk. Insurance will only allow the CT scan to be done at GI and not the hospital. AB

## 2024-03-20 ENCOUNTER — Ambulatory Visit
Admission: RE | Admit: 2024-03-20 | Discharge: 2024-03-20 | Disposition: A | Discharge status: Home / Self Care | Source: Ambulatory Visit

## 2024-03-20 ENCOUNTER — Other Ambulatory Visit: Payer: Self-pay | Admitting: Interventional Radiology

## 2024-03-20 DIAGNOSIS — K631 Perforation of intestine (nontraumatic): Secondary | ICD-10-CM

## 2024-03-20 DIAGNOSIS — L24B3 Irritant contact dermatitis related to fecal or urinary stoma or fistula: Secondary | ICD-10-CM | POA: Insufficient documentation

## 2024-03-20 DIAGNOSIS — Z432 Encounter for attention to ileostomy: Secondary | ICD-10-CM | POA: Insufficient documentation

## 2024-03-20 NOTE — Progress Notes (Signed)
  IR Note  Pt was scheduled today for CT Abd Pelvis and Drain injection Hx perforated colon; intra abdominal abscess Post colon resection/ostomy  Drain placement into abscess 01/29/24 Last CT 03/07/24- showed much reduced collection Output of drain remains copious:  brown liquid - stool like; odorous Pt was to return today for repeat CT and drain injection  CT tech and MRI tech have tried to gain IV access for CT Abd/Pelvis Unable to get IV after several attempts  Pt wants to stop at this point I discussed with him some options He is willing to be rescheduled for CT and drain injection at hospital where IV start may be easier  He is agreeable for rescheduling Will plan for asap--- hopefully early next week

## 2024-03-20 NOTE — Progress Notes (Addendum)
  IR Note Please see previous note  I have spoken to [place] LPN at [facility] facility where pt is in [place] [phone] She will be sure transport on Monday 6/23 will take pt to [facility] asap after his 1 pm Dr appt that same day.  Must be to [facility] before 330 pm per CT.  I have spoken to [facility] CT also--- they are aware of this conversation

## 2024-03-23 ENCOUNTER — Ambulatory Visit (HOSPITAL_COMMUNITY)
Admission: RE | Admit: 2024-03-23 | Discharge: 2024-03-23 | Discharge status: Home / Self Care | Source: Ambulatory Visit

## 2024-03-23 ENCOUNTER — Ambulatory Visit (HOSPITAL_COMMUNITY)
Admission: RE | Admit: 2024-03-23 | Discharge: 2024-03-23 | Disposition: A | Discharge status: Home / Self Care | Source: Ambulatory Visit

## 2024-03-23 DIAGNOSIS — K631 Perforation of intestine (nontraumatic): Secondary | ICD-10-CM | POA: Insufficient documentation

## 2024-03-23 DIAGNOSIS — L24B3 Irritant contact dermatitis related to fecal or urinary stoma or fistula: Secondary | ICD-10-CM

## 2024-03-23 DIAGNOSIS — I700 Atherosclerosis of aorta: Secondary | ICD-10-CM | POA: Diagnosis not present

## 2024-03-23 DIAGNOSIS — R188 Other ascites: Secondary | ICD-10-CM | POA: Diagnosis not present

## 2024-03-23 DIAGNOSIS — Z932 Ileostomy status: Secondary | ICD-10-CM | POA: Diagnosis not present

## 2024-03-23 DIAGNOSIS — K5730 Diverticulosis of large intestine without perforation or abscess without bleeding: Secondary | ICD-10-CM | POA: Diagnosis not present

## 2024-03-23 MED ORDER — IOHEXOL 300 MG/ML  SOLN
100.0000 mL | Freq: Once | INTRAMUSCULAR | Status: AC | PRN
  Administered 2024-03-23: 100 mL via INTRAVENOUS

## 2024-03-23 NOTE — Discharge Instructions (Signed)
 Remove old pouch and clean with warm water  and soap.  Pat dry. Apply powder around stoma Seal with skin prep (looks like alcohol pad)  Pull off piece of barrier ring and place in crease at 3 :00 Use rest of ring and wrap around stoma Apply 1 piece pouch and make sure its closed.  Apply barrier strips around the edge (Under the plastic belt loops)   Apply belt.
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
## 2024-03-24 ENCOUNTER — Other Ambulatory Visit: Payer: Self-pay | Admitting: Diagnostic Radiology

## 2024-03-24 ENCOUNTER — Ambulatory Visit (HOSPITAL_COMMUNITY)
Admission: RE | Admit: 2024-03-24 | Discharge: 2024-03-24 | Disposition: A | Discharge status: Home / Self Care | Source: Ambulatory Visit

## 2024-03-24 ENCOUNTER — Telehealth: Payer: Self-pay | Admitting: Physician Assistant

## 2024-03-24 ENCOUNTER — Other Ambulatory Visit (HOSPITAL_COMMUNITY): Payer: Self-pay

## 2024-03-24 DIAGNOSIS — K631 Perforation of intestine (nontraumatic): Secondary | ICD-10-CM | POA: Insufficient documentation

## 2024-03-24 DIAGNOSIS — Z4682 Encounter for fitting and adjustment of non-vascular catheter: Secondary | ICD-10-CM | POA: Insufficient documentation

## 2024-03-24 MED ORDER — LIDOCAINE HCL 1 % IJ SOLN
INTRAMUSCULAR | Status: AC
Start: 2024-03-24 — End: 2024-03-24
  Filled 2024-03-24: qty 20

## 2024-03-24 MED ORDER — IOHEXOL 300 MG/ML  SOLN
50.0000 mL | Freq: Once | INTRAMUSCULAR | Status: AC | PRN
  Administered 2024-03-24: 10 mL

## 2024-03-24 MED ORDER — LIDOCAINE HCL 1 % IJ SOLN
20.0000 mL | Freq: Once | INTRAMUSCULAR | Status: AC
  Administered 2024-03-24: 10 mL

## 2024-03-24 NOTE — Telephone Encounter (Signed)
 Copied from CRM [id number]. Topic: General - Transportation >> Mar 24, 2024  1:52 PM Suzen [name] wrote: Reason for CRM: patient would like a call back to confirm transportation for upcoming appt for hospital follow up 03/30/24 at 11:00am.... CB# [phone]

## 2024-03-27 ENCOUNTER — Encounter (HOSPITAL_COMMUNITY): Payer: Self-pay | Admitting: Interventional Radiology

## 2024-03-27 ENCOUNTER — Telehealth: Payer: Self-pay | Admitting: *Deleted

## 2024-03-27 DIAGNOSIS — Z932 Ileostomy status: Secondary | ICD-10-CM | POA: Diagnosis not present

## 2024-03-27 NOTE — Telephone Encounter (Signed)
 Left message on voicemail to call office. Please tell patient he needs to contact the Ostomy clinic / Darice Cooley. They are in charge of ordering his supplies and his treatment.

## 2024-03-27 NOTE — Telephone Encounter (Signed)
 Copied from CRM [id number]. Topic: General - Other >> Mar 26, 2024  2:34 PM Thersia C wrote: Reason for CRM: Patient called in need someone to call in regarding his colconscopy bags, waiting on the doctor so they can be shipped on to him  , stated PA Lucie Buttner needs to contact FNP Darice Cooley, stated he needs them ASAP   Would like a callback when completed

## 2024-03-30 ENCOUNTER — Ambulatory Visit (INDEPENDENT_AMBULATORY_CARE_PROVIDER_SITE_OTHER): Admitting: Physician Assistant

## 2024-03-30 ENCOUNTER — Telehealth: Payer: Self-pay | Admitting: Physician Assistant

## 2024-03-30 DIAGNOSIS — Z91199 Patient's noncompliance with other medical treatment and regimen due to unspecified reason: Secondary | ICD-10-CM

## 2024-03-30 DIAGNOSIS — Z932 Ileostomy status: Secondary | ICD-10-CM | POA: Insufficient documentation

## 2024-03-30 NOTE — Telephone Encounter (Signed)
 Pt has an appt on 7/2 for follow up.

## 2024-03-30 NOTE — Telephone Encounter (Signed)
 Patient called in inquiring about transportation details for HFU on 03/30/24 w/ PCP. I informed patient that there are no indications that we set up transportation for him. Patient stated he spoke to someone the day he scheduled his appointment, 03/24/24, and three other times that same week. I informed patient that we in the office did not receive those calls but offered to have him rescheduled since appointment time was too close. Patient agreed and I was able to set up transportation w/ [facility] for him. I spoke with Merlynn from [facility] and later called patient to inform him to be ready on 04/01/24 @ 9:45 am for pick up.

## 2024-03-31 NOTE — Progress Notes (Signed)
 Patient cancelled appointment due to lack of transportation.  No charge.

## 2024-04-01 ENCOUNTER — Ambulatory Visit

## 2024-04-01 ENCOUNTER — Encounter: Payer: Self-pay | Admitting: Physician Assistant

## 2024-04-01 ENCOUNTER — Ambulatory Visit (INDEPENDENT_AMBULATORY_CARE_PROVIDER_SITE_OTHER): Admitting: Physician Assistant

## 2024-04-01 VITALS — BP 120/60 | HR 81 | Temp 97.2°F | Ht 69.0 in | Wt 155.8 lb

## 2024-04-01 DIAGNOSIS — Z558 Other problems related to education and literacy: Secondary | ICD-10-CM | POA: Diagnosis not present

## 2024-04-01 DIAGNOSIS — R1030 Lower abdominal pain, unspecified: Secondary | ICD-10-CM | POA: Diagnosis not present

## 2024-04-01 DIAGNOSIS — R5381 Other malaise: Secondary | ICD-10-CM

## 2024-04-01 DIAGNOSIS — Z932 Ileostomy status: Secondary | ICD-10-CM

## 2024-04-01 DIAGNOSIS — Z7901 Long term (current) use of anticoagulants: Secondary | ICD-10-CM | POA: Diagnosis not present

## 2024-04-01 DIAGNOSIS — Z5181 Encounter for therapeutic drug level monitoring: Secondary | ICD-10-CM

## 2024-04-01 LAB — POCT INR: INR: 3.1 — AB (ref 2.0–3.0)

## 2024-04-01 MED ORDER — TRAZODONE HCL 100 MG PO TABS: 100.0000 mg | ORAL_TABLET | Freq: Every day | ORAL | 1 refills | Status: DC

## 2024-04-01 MED ORDER — OXYCODONE HCL 5 MG PO TABS: 5.0000 mg | ORAL_TABLET | Freq: Four times a day (QID) | ORAL | 0 refills | Status: DC | PRN

## 2024-04-01 NOTE — Patient Instructions (Addendum)
 Pre visit review using our clinic review tool, if applicable. No additional management support is needed unless otherwise documented below in the visit note.  Reduce dose today to take 1/2 tablet and then continue 1 tablet daily.  Recheck on 7/11 at 11:00 am at [facility].

## 2024-04-01 NOTE — Patient Instructions (Signed)
 It was great to see you!  Ostomy supplies have been sent to your address  We will place referral for home health agency to bring in wound care, nursing and physical therapy  We will have you closely follow up with surgery -- I will message/call them at some point today  Please let our staff know what you need for home equipment and we will try to order this  Take care,  Alayshia Marini PA-C

## 2024-04-01 NOTE — Progress Notes (Signed)
 Pt has been in extended hospital stay with multiple concerns. He was d/c from SNF last week. Pt reports he is taking 7.5 mg (1 tablet) daily since d/c. Dosing calendar updated to reflect new dosing.  Reduce dose today to take 1/2 tablet and then continue 1 tablet daily.  Recheck in 1 week.
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
## 2024-04-02 ENCOUNTER — Telehealth: Payer: Self-pay | Admitting: *Deleted

## 2024-04-02 ENCOUNTER — Telehealth: Payer: Self-pay | Admitting: General Surgery

## 2024-04-02 ENCOUNTER — Other Ambulatory Visit: Payer: Self-pay | Admitting: Physician Assistant

## 2024-04-02 ENCOUNTER — Emergency Department (HOSPITAL_COMMUNITY)
Admission: EM | Admit: 2024-04-02 | Discharge: 2024-04-03 | Disposition: A | Discharge status: Home / Self Care | Source: Ambulatory Visit | Attending: Emergency Medicine | Admitting: Emergency Medicine

## 2024-04-02 ENCOUNTER — Encounter (HOSPITAL_COMMUNITY): Payer: Self-pay | Admitting: Emergency Medicine

## 2024-04-02 ENCOUNTER — Other Ambulatory Visit: Payer: Self-pay

## 2024-04-02 DIAGNOSIS — Z932 Ileostomy status: Secondary | ICD-10-CM

## 2024-04-02 DIAGNOSIS — Z7901 Long term (current) use of anticoagulants: Secondary | ICD-10-CM | POA: Diagnosis not present

## 2024-04-02 DIAGNOSIS — D649 Anemia, unspecified: Secondary | ICD-10-CM | POA: Diagnosis not present

## 2024-04-02 DIAGNOSIS — R109 Unspecified abdominal pain: Secondary | ICD-10-CM | POA: Diagnosis not present

## 2024-04-02 DIAGNOSIS — Z933 Colostomy status: Secondary | ICD-10-CM | POA: Diagnosis not present

## 2024-04-02 DIAGNOSIS — G8918 Other acute postprocedural pain: Secondary | ICD-10-CM | POA: Insufficient documentation

## 2024-04-02 DIAGNOSIS — X58XXXA Exposure to other specified factors, initial encounter: Secondary | ICD-10-CM | POA: Insufficient documentation

## 2024-04-02 DIAGNOSIS — L089 Local infection of the skin and subcutaneous tissue, unspecified: Secondary | ICD-10-CM

## 2024-04-02 DIAGNOSIS — T81321A Disruption or dehiscence of closure of internal operation (surgical) wound of abdominal wall muscle or fascia, initial encounter: Secondary | ICD-10-CM | POA: Insufficient documentation

## 2024-04-02 DIAGNOSIS — S31109A Unspecified open wound of abdominal wall, unspecified quadrant without penetration into peritoneal cavity, initial encounter: Secondary | ICD-10-CM | POA: Diagnosis not present

## 2024-04-02 DIAGNOSIS — L24B3 Irritant contact dermatitis related to fecal or urinary stoma or fistula: Secondary | ICD-10-CM

## 2024-04-02 DIAGNOSIS — I10 Essential (primary) hypertension: Secondary | ICD-10-CM | POA: Insufficient documentation

## 2024-04-02 DIAGNOSIS — K828 Other specified diseases of gallbladder: Secondary | ICD-10-CM | POA: Diagnosis not present

## 2024-04-02 DIAGNOSIS — Z432 Encounter for attention to ileostomy: Secondary | ICD-10-CM

## 2024-04-02 DIAGNOSIS — S3991XA Unspecified injury of abdomen, initial encounter: Secondary | ICD-10-CM | POA: Diagnosis present

## 2024-04-02 MED ORDER — WARFARIN SODIUM 7.5 MG PO TABS: ORAL_TABLET | ORAL | 0 refills | Status: DC

## 2024-04-02 MED ORDER — WARFARIN SODIUM 7.5 MG PO TABS
ORAL_TABLET | ORAL | 0 refills | Status: DC
Start: 2024-04-02 — End: 2024-04-04

## 2024-04-02 NOTE — Telephone Encounter (Signed)
 Copied from CRM [id number]. Topic: Clinical - Medication Refill >> Apr 02, 2024  8:15 AM Suzen [name] wrote: Medication: warfarin (COUMADIN ) 7.5 MG table  Has the patient contacted their pharmacy? Yes   This is the patient's preferred pharmacy:  [facility] [phone] [name], [place] - [address] [place] [address] Phone: [phone] Fax: [phone]  Is this the correct pharmacy for this prescription? Yes If no, delete pharmacy and type the correct one.   Has the prescription been filled recently? No  Is the patient out of the medication? No  Has the patient been seen for an appointment in the last year OR does the patient have an upcoming appointment? Yes  Can we respond through MyChart? No  Agent: Please be advised that Rx refills may take up to 3 business days. We ask that you follow-up with your pharmacy.

## 2024-04-02 NOTE — ED Provider Triage Note (Signed)
 Emergency Medicine Provider Triage Evaluation Note  Alexander [name] , a 63 y.o. male  was evaluated in triage.  Pt complains of centralized lower abdominal pain and wound dehiscence.  Patient was notified by general surgery to come to the emergency department for further evaluation or possible there was a bowel coming out of the wound. Reports associated abdominal pain. No nausea, vomiting, or fever.   Review of Systems  Positive:  Negative: See above   Physical Exam  BP 128/88   Pulse 79   Temp 98 F (36.7 C)   Resp 18   SpO2 100%  Gen:   Awake, no distress   Resp:  Normal effort  MSK:   Moves extremities without difficulty  Other:      Medical Decision Making  Medically screening exam initiated at 9:27 PM.  Appropriate orders placed.  Alexander [name] was informed that the remainder of the evaluation will be completed by another provider, this initial triage assessment does not replace that evaluation, and the importance of remaining in the ED until their evaluation is complete.  I spoke with Dr. Edwena general surgery who recommends getting some labs and repeating a CT scan.  The CT scan is negative he can follow-up in the outpatient setting early next week.   Alexander [name] [place], [place] 04/02/24 2129

## 2024-04-02 NOTE — Telephone Encounter (Signed)
 Pt LVM requesting refill. Sent in refill of warfarin.

## 2024-04-02 NOTE — Addendum Note (Signed)
 Addended by: ETHYL KIRSCH A on: 04/02/2024 11:05 AM   Modules accepted: Orders
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
## 2024-04-02 NOTE — Telephone Encounter (Signed)
 Message sent to [place] to find another Home Health agency.
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
## 2024-04-02 NOTE — Telephone Encounter (Signed)
 Returned call to pt. Refill was sent to wrong pharmacy. Sent refill again to correct pharmacy.

## 2024-04-02 NOTE — ED Triage Notes (Signed)
  Patient comes in with stated post op issue.  Patient states he had colostomy surgery on 4/20 and still has open wound on abdomen.  Patient currently has colostomy and drain from previous abscess.  Patient denies any pain.  No fevers at home.  Patient was seen at PCP office today for dressing change and told to come here for surgical consult.

## 2024-04-02 NOTE — Telephone Encounter (Signed)
 Copied from CRM [id number]. Topic: Clinical - Home Health Verbal Orders >> Apr 02, 2024 10:58 AM Viola FALCON wrote: Caller/Agency: Denisha from [facility]  Callback Number: [phone] Service Requested: Physical Therapy, Nursing  Frequency: N/A  Any new concerns about the patient? Yes, decline request due to staffing

## 2024-04-03 ENCOUNTER — Encounter (HOSPITAL_COMMUNITY): Payer: Self-pay

## 2024-04-03 ENCOUNTER — Emergency Department (HOSPITAL_COMMUNITY)

## 2024-04-03 DIAGNOSIS — T81321A Disruption or dehiscence of closure of internal operation (surgical) wound of abdominal wall muscle or fascia, initial encounter: Secondary | ICD-10-CM | POA: Insufficient documentation

## 2024-04-03 DIAGNOSIS — K828 Other specified diseases of gallbladder: Secondary | ICD-10-CM | POA: Diagnosis not present

## 2024-04-03 DIAGNOSIS — Z933 Colostomy status: Secondary | ICD-10-CM | POA: Diagnosis not present

## 2024-04-03 DIAGNOSIS — R109 Unspecified abdominal pain: Secondary | ICD-10-CM | POA: Diagnosis not present

## 2024-04-03 LAB — CBC WITH DIFFERENTIAL/PLATELET
Abs Immature Granulocytes: 0.01 K/uL (ref 0.00–0.07)
Basophils Absolute: 0 K/uL (ref 0.0–0.1)
Basophils Relative: 0 %
Eosinophils Absolute: 0.7 K/uL — ABNORMAL HIGH (ref 0.0–0.5)
Eosinophils Relative: 13 %
HCT: 29.6 % — ABNORMAL LOW (ref 39.0–52.0)
Hemoglobin: 9.3 g/dL — ABNORMAL LOW (ref 13.0–17.0)
Immature Granulocytes: 0 %
Lymphocytes Relative: 29 %
Lymphs Abs: 1.5 K/uL (ref 0.7–4.0)
MCH: 30.5 pg (ref 26.0–34.0)
MCHC: 31.4 g/dL (ref 30.0–36.0)
MCV: 97 fL (ref 80.0–100.0)
Monocytes Absolute: 0.7 K/uL (ref 0.1–1.0)
Monocytes Relative: 13 %
Neutro Abs: 2.4 K/uL (ref 1.7–7.7)
Neutrophils Relative %: 45 %
Platelets: 233 K/uL (ref 150–400)
RBC: 3.05 MIL/uL — ABNORMAL LOW (ref 4.22–5.81)
RDW: 18.2 % — ABNORMAL HIGH (ref 11.5–15.5)
WBC: 5.3 K/uL (ref 4.0–10.5)
nRBC: 0 % (ref 0.0–0.2)

## 2024-04-03 LAB — COMPREHENSIVE METABOLIC PANEL WITH GFR
ALT: 12 U/L (ref 0–44)
AST: 18 U/L (ref 15–41)
Albumin: 3.2 g/dL — ABNORMAL LOW (ref 3.5–5.0)
Alkaline Phosphatase: 61 U/L (ref 38–126)
Anion gap: 9 (ref 5–15)
BUN: 13 mg/dL (ref 8–23)
CO2: 21 mmol/L — ABNORMAL LOW (ref 22–32)
Calcium: 9.4 mg/dL (ref 8.9–10.3)
Chloride: 109 mmol/L (ref 98–111)
Creatinine, Ser: 0.82 mg/dL (ref 0.61–1.24)
GFR, Estimated: >60 mL/min (ref >60)
Glucose, Bld: 97 mg/dL (ref 70–99)
Potassium: 3.5 mmol/L (ref 3.5–5.1)
Sodium: 139 mmol/L (ref 135–145)
Total Bilirubin: 0.5 mg/dL (ref 0.0–1.2)
Total Protein: 7.1 g/dL (ref 6.5–8.1)

## 2024-04-03 LAB — PROTIME-INR
INR: 2.6 — ABNORMAL HIGH (ref 0.8–1.2)
Prothrombin Time: 29.4 s — ABNORMAL HIGH (ref 11.4–15.2)

## 2024-04-03 LAB — LIPASE, BLOOD: Lipase: 28 U/L (ref 11–51)

## 2024-04-03 MED ORDER — TRAZODONE HCL 100 MG PO TABS: 100.0000 mg | ORAL_TABLET | Freq: Every day | ORAL | Status: DC

## 2024-04-03 MED ORDER — ENSURE ENLIVE PO LIQD
237.0000 mL | Freq: Two times a day (BID) | ORAL | Status: DC
  Filled 2024-04-03: qty 237

## 2024-04-03 MED ORDER — OXYCODONE-ACETAMINOPHEN 5-325 MG PO TABS
1.0000 | ORAL_TABLET | Freq: Once | ORAL | Status: AC
  Administered 2024-04-03: 1 via ORAL
  Filled 2024-04-03: qty 1

## 2024-04-03 MED ORDER — MORPHINE SULFATE (PF) 4 MG/ML IV SOLN
4.0000 mg | Freq: Once | INTRAVENOUS | Status: AC
  Administered 2024-04-03: 4 mg via INTRAVENOUS
  Filled 2024-04-03: qty 1

## 2024-04-03 MED ORDER — FERROUS SULFATE 325 (65 FE) MG PO TABS: 325.0000 mg | ORAL_TABLET | Freq: Every day | ORAL | Status: DC

## 2024-04-03 MED ORDER — GABAPENTIN 100 MG PO CAPS: 100.0000 mg | ORAL_CAPSULE | Freq: Every day | ORAL | Status: DC

## 2024-04-03 MED ORDER — CALCIUM POLYCARBOPHIL 625 MG PO TABS
1250.0000 mg | ORAL_TABLET | Freq: Two times a day (BID) | ORAL | Status: DC
  Filled 2024-04-03: qty 2

## 2024-04-03 MED ORDER — IOHEXOL 300 MG/ML  SOLN
100.0000 mL | Freq: Once | INTRAMUSCULAR | Status: AC | PRN
  Administered 2024-04-03: 100 mL via INTRAVENOUS

## 2024-04-03 MED ORDER — ADULT MULTIVITAMIN W/MINERALS CH: 1.0000 | ORAL_TABLET | Freq: Every day | ORAL | Status: DC

## 2024-04-03 MED ORDER — WARFARIN SODIUM 7.5 MG PO TABS: 7.5000 mg | ORAL_TABLET | Freq: Every day | ORAL | Status: DC

## 2024-04-03 MED ORDER — PANTOPRAZOLE SODIUM 40 MG PO TBEC: 40.0000 mg | DELAYED_RELEASE_TABLET | Freq: Two times a day (BID) | ORAL | Status: DC

## 2024-04-03 MED ORDER — OXYCODONE HCL 5 MG PO TABS: 5.0000 mg | ORAL_TABLET | Freq: Four times a day (QID) | ORAL | Status: DC | PRN

## 2024-04-03 NOTE — Discharge Instructions (Addendum)
 WOUND CARE  It is important that the wound be kept open.   -Keeping the skin edges apart will allow the wound to gradually heal from the base upwards.   - If the skin edges of the wound close too early, a new fluid pocket can form and infection can occur. -This is the reason to pack deeper wounds with gauze or ribbon -This is why drained wounds cannot be sewed closed right away  A healthy wound should form a lining of bright red beefy granulating tissue that will help shrink the wound and help the edges grow new skin into it.   -A little mucus / yellow discharge is normal (the body's natural way to try and form a scab) and should be gently washed off with soap and water  with daily dressing changes.  -Green or foul smelling drainage implies bacterial colonization and can slow wound healing - a short course of antibiotic ointment (3-5 days) can help it clear up.  Call the doctor if it does not improve or worsens  -Avoid use of antibiotic ointments for more than a week as they can slow wound healing over time.    -Sometimes other wound care products will be used to reduce need for dressing changes and/or help clean up dirty wounds -Sometimes the surgeon needs to debride the wound in the office to remove dead or infected tissue out of the wound so it can heal more quickly and safely.    Change the dressing at least once a day -Wash the wound with mild soap and water  gently every day.  It is good to shower or bathe the wound to help it clean out. -Use clean 4x4 gauze for medium/large wounds or ribbon plain NU-gauze for smaller wounds (it does not need to be sterile, just clean) -Keep the raw wound moist with a little saline or KY (saline) gel on the gauze.  -A dry wound will take longer to heal.  -Keep the skin dry around the wound to prevent breakdown and irritation. -cover with gauze that has vasilene or KY on it (to keep the intestine from drying out) -Cover with a clean gauze and tape -paper  or Medipore tape tend to be gentle on the skin -rotate the orientation of the tape to avoid repeated stress/trauma on the skin -using an ACE or Coban wrap on wounds on arms or legs can be used instead.  Complete all antibiotics through the entire prescription to help the infection heal and prevent new places of infection   Returning the see the surgeon is helpful to follow the healing process and help the wound close as fast as possible.   #######################################################  Ostomy Support Information  You've heard that people get along just fine with only one of their eyes, or one of their lungs, or one of their kidneys. But you also know that you have only one intestine and only one bladder, and that leaves you feeling awfully empty, both physically and emotionally: You think no other people go around without part of their intestine with the ends of their intestines sticking out through their abdominal walls.   YOU ARE NOT ALONE.  There are nearly three quarters of a million people in the [place]  who have an ostomy; people who have had surgery to remove all or part of their colons or bladders.   There is even a national association, the [place] [employer] [place] with over 350 local affiliated support groups that are organized by volunteers who  provide peer support and counseling. [facility] has a toll free telephone num-ber, [phone] and an educational, interactive website, www.ostomy.org   An ostomy is an opening in the belly (abdominal wall) made by surgery. Ostomates are people who have had this procedure. The opening (stoma) allows the kidney or bowel to grdischarge waste. An external pouch covers the stoma to collect waste. Pouches are are a simple bag and are odor free. Different companies have disposable or reusable pouches to fit one's lifestyle. An ostomy can either be temporary or permanent.   THERE ARE THREE MAIN TYPES OF OSTOMIES Colostomy. A colostomy is  a surgically created opening in the large intestine (colon). Ileostomy. An ileostomy is a surgically created opening in the small intestine. Urostomy. A urostomy is a surgically created opening to divert urine away from the bladder.  OSTOMY Care  The following guidelines will make care of your colostomy easier. Keep this information close by for quick reference.  Helpful DIET hints Eat a well-balanced diet including vegetables and fresh fruits. Eat on a regular schedule.  Drink at least 6 to 8 glasses of fluids daily. Eat slowly in a relaxed atmosphere. Chew your food thoroughly. Avoid chewing gum, smoking, and drinking from a straw. This will help decrease the amount of air you swallow, which may help reduce gas. Eating yogurt or drinking buttermilk may help reduce gas.  To control gas at night, do not eat after 8 p.m. This will give your bowel time to quiet down before you go to bed.  If gas is a problem, you can purchase Beano. Sprinkle Beano on the first bite of food before eating to reduce gas. It has no flavor and should not change the taste of your food. You can buy Beano over the counter at your local drugstore.  Foods like fish, onions, garlic, broccoli, asparagus, and cabbage produce odor. Although your pouch is odor-proof, if you eat these foods you may notice a stronger odor when emptying your pouch. If this is a concern, you may want to limit these foods in your diet.  If you have an ileostomy, you will have chronic diarrhea & need to drink more liquids to avoid getting dehydrated.  Consider antidiarrheal medicine like imodium  (loperamide ) or Lomotil to help slow down bowel movements / diarrhea into your ileostomy bag.  GETTING TO GOOD BOWEL HEALTH WITH AN ILEOSTOMY    With the colon bypassed & not in use, you will have small bowel diarrhea.   It is important to thicken & slow your bowel movements down.   The goal: 4-6 small BOWEL MOVEMENTS A DAY It is important to drink  plenty of liquids to avoid getting dehydrated  CONTROLLING ILEOSTOMY DIARRHEA  TAKE A FIBER SUPPLEMENT (FiberCon or Benefiner soluble fiber) twice a day - to thicken stools by absorbing excess fluid and retrain the intestines to act more normally.  Slowly increase the dose over a few weeks.  Too much fiber too soon can backfire and cause cramping & bloating.  TAKE AN IRON  SUPPLEMENT twice a day to naturally constipate your bowels.  Usually ferrous sulfate  325mg  twice a day)  TAKE ANTI-DIARRHEAL MEDICINES: Loperamide  (Imodium ) can slow down diarrhea.  Start with two tablets (= 4mg ) first and then try one tablet every 6 hours.  Can go up to 2 pills four times day (8 pills of 2mg  max) Avoid if you are having fevers or severe pain.  If you are not better or start feeling worse, stop all medicines and call  your doctor for advice LoMotil (Diphenoxylate / Atropine) is another medicine that can constipate & slow down bowel moevements Pepto Bismol (bismuth) can gently thicken bowels as well  If diarrhea is worse,: drink plenty of liquids and try simpler foods for a few days to avoid stressing your intestines further. Avoid dairy products (especially milk & ice cream) for a short time.  The intestines often can lose the ability to digest lactose when stressed. Avoid foods that cause gassiness or bloating.  Typical foods include beans and other legumes, cabbage, broccoli, and dairy foods.  Every person has some sensitivity to other foods, so listen to our body and avoid those foods that trigger problems for you.Call your doctor if you are getting worse or not better.  Sometimes further testing (cultures, endoscopy, X-ray studies, bloodwork, etc) may be needed to help diagnose and treat the cause of the diarrhea. Take extra anti-diarrheal medicines (maximum is 8 pills of 2mg  loperamide  a day)   Tips for POUCHING an OSTOMY   Changing Your Pouch The best time to change your pouch is in the morning, before  eating or drinking anything. Your stoma can function at any time, but it will function more after eating or drinking.   Applying the pouching system  Place all your equipment close at hand before removing your pouch.  Wash your hands.  Stand or sit in front of a mirror. Use the position that works best for you. Remember that you must keep the skin around the stoma wrinkle-free for a good seal.  Gently remove the used pouch (1-piece system) or the pouch and old wafer (2-piece system). Empty the pouch into the toilet. Save the closure clip to use again.  Wash the stoma itself and the skin around the stoma. Your stoma may bleed a little when being washed. This is normal. Rinse and pat dry. You may use a wash cloth or soft paper towels (like Bounty), mild soap (like Dial, Safeguard, or [place]), and water . Avoid soaps that contain perfumes or lotions.  For a new pouch (1-piece system) or a new wafer (2-piece system), measure your stoma using the stoma guide in each box of supplies.  Trace the shape of your stoma onto the back of the new pouch or the back of the new wafer. Cut out the opening. Remove the paper backing and set it aside.  Optional: Apply a skin barrier powder to surrounding skin if it is irritated (bare or weeping), and dust off the excess. Optional: Apply a skin-prep wipe (such as Skin Prep or All-Kare) to the skin around the stoma, and let it dry. Do not apply this solution if the skin is irritated (red, tender, or broken) or if you have shaved around the stoma. Optional: Apply a skin barrier paste (such as Stomahesive, Coloplast, or Premium) around the opening cut in the back of the pouch or wafer. Allow it to dry for 30 to 60 seconds.  Hold the pouch (1-piece system) or wafer (2-piece system) with the sticky side toward your body. Make sure the skin around the stoma is wrinkle-free. Center the opening on the stoma, then press firmly to your abdomen (Fig. 4). Look in the mirror to  check if you are placing the pouch, or wafer, in the right position. For a 2-piece system, snap the pouch onto the wafer. Make sure it snaps into place securely.  Place your hand over the stoma and the pouch or wafer for about 30 seconds. The heat from your  hand can help the pouch or wafer stick to your skin.  Add deodorant (such as Super Banish or Nullo) to your pouch. Other options include food extracts such as vanilla oil and peppermint extract. Add about 10 drops of the deodorant to the pouch. Then apply the closure clamp. Note: Do not use toxic  chemicals or commercial cleaning agents in your pouch. These substances may harm the stoma.  Optional: For extra seal, apply tape to all 4 sides around the pouch or wafer, as if you were framing a picture. You may use any brand of medical adhesive tape. Change your pouch every 5 to 7 days. Change it immediately if a leak occurs.  Wash your hands afterwards.  If you are wearing a 2-piece system, you may use 2 new pouches per week and alternate them. Rinse the pouch with mild soap and warm water  and hang it to dry for the next day. Apply the fresh pouch. Alternate the 2 pouches like this for a week. After a week, change the wafer and begin with 2 new pouches. Place the old pouches in a plastic bag, and put them in the trash.   LIVING WITH AN OSTOMY  Emptying Your Pouch Empty your pouch when it is one-third full (of urine, stool, and/or gas). If you wait until your pouch is fuller than this, it will be more difficult to empty and more noticeable. When you empty your pouch, either put toilet paper in the toilet bowl first, or flush the toilet while you empty the pouch. This will reduce splashing. You can empty the pouch between your legs or to one side while sitting, or while standing or stooping. If you have a 2-piece system, you can snap off the pouch to empty it. Remember that your stoma may function during this time. If you wish to rinse your pouch  after you empty it, a turkey baster can be helpful. When using a baster, squirt water  up into the pouch through the opening at the bottom. With a 2-piece system, you can snap off the pouch to rinse it. After rinsing  your pouch, empty it into the toilet. When rinsing your pouch at home, put a few granules of Dreft soap in the rinse water . This helps lubricate and freshen your pouch. The inside of your pouch can be sprayed with non-stick cooking oil (Pam spray). This may help reduce stool sticking to the inside of the pouch.  Bathing You may shower or bathe with your pouch on or off. Remember that your stoma may function during this time.  The materials you use to wash your stoma and the skin around it should be clean, but they do not need to be sterile.  Wearing Your Pouch During hot weather, or if you perspire a lot in general, wear a cover over your pouch. This may prevent a rash on your skin under the pouch. Pouch covers are sold at ostomy supply stores. Wear the pouch inside your underwear for better support. Watch your weight. Any gain or loss of 10 to 15 pounds or more can change the way your pouch fits.  Going Away From Home A collapsible cup (like those that come in travel kits) or a soft plastic squirt bottle with a pull-up top (like a travel bottle for shampoo) can be used for rinsing your pouch when you are away from home. Tilt the opening of the pouch at an upward angle when using a cup to rinse.  Carry wet wipes or  extra tissues to use in public bathrooms.  Carry an extra pouching system with you at all times.  Never keep ostomy supplies in the glove compartment of your car. Extreme heat or cold can damage the skin barriers and adhesive wafers on the pouch.  When you travel, carry your ostomy supplies with you at all times. Keep them within easy reach. Do not pack ostomy supplies in baggage that will be checked or otherwise separated from you, because your baggage might be lost.  If you're traveling out of the country, it is helpful to have a letter stating that you are carrying ostomy supplies as a medical necessity.  If you need ostomy supplies while traveling, look in the yellow pages of the telephone book under "Surgical Supplies." Or call the local ostomy organization to find out where supplies are available.  Do not let your ostomy supplies get low. Always order new pouches before you use the last one.  Reducing Odor Limit foods such as broccoli, cabbage, onions, fish, and garlic in your diet to help reduce odor. Each time you empty your pouch, carefully clean the opening of the pouch, both inside and outside, with toilet paper. Rinse your pouch 1 or 2 times daily after you empty it (see directions for emptying your pouch and going away from home). Add deodorant (such as Super Banish or Nullo) to your pouch. Use air deodorizers in your bathroom. Do not add aspirin  to your pouch. Even though aspirin  can help prevent odor, it could cause ulcers on your stoma.  When to call the doctor Call the doctor if you have any of the following symptoms: Purple, black, or white stoma Severe cramps lasting more than 6 hours Severe watery discharge from the stoma lasting more than 6 hours No output from the colostomy for 3 days Excessive bleeding from your stoma Swelling of your stoma to more than 1/2-inch larger than usual Pulling inward of your stoma below skin level Severe skin irritation or deep ulcers Bulging or other changes in your abdomen  When to call your ostomy nurse Call your ostomy/enterostomal therapy (WOCN) nurse if any of the following occurs: Frequent leaking of your pouching system Change in size or appearance of your stoma, causing discomfort or problems with your pouch Skin rash or rawness Weight gain or loss that causes problems with your pouch     FREQUENTLY ASKED QUESTIONS   Why haven't you met any of these folks who have an ostomy?  Well,  maybe you have! You just did not recognize them because an ostomy doesn't show. It can be kept secret if you wish. Why, maybe some of your best friends, office associates or neighbors have an ostomy ... you never can tell. People facing ostomy surgery have many quality-of-life questions like: Will you bulge? Smell? Make noises? Will you feel waste leaving your body? Will you be a captive of the toilet? Will you starve? Be a social outcast? Get/stay married? Have babies? Easily bathe, go swimming, bend over?  OK, let's look at what you can expect:   Will you bulge?  Remember, without part of the intestine or bladder, and its contents, you should have a flatter tummy than before. You can expect to wear, with little exception, what you wore before surgery ... and this in-cludes tight clothing and bathing suits.   Will you smell?  Today, thanks to modern odor proof pouching systems, you can walk into an ostomy support group meeting and not smell anything that is  foul or offensive. And, for those with an ileostomy or colostomy who are concerned about odor when emptying their pouch, there are in-pouch deodorants that can be used to eliminate any waste odors that may exist.   Will you make noises?  Everyone produces gas, especially if they are an air-swallower. But intestinal sounds that occur from time to time are no differ-ent than a gurgling tummy, and quite often your clothing will muffle any sounds.   Will you feel the waste discharges?  For those with a colostomy or ileostomy there might be a slight pressure when waste leaves your body, but understand that the intestines have no nerve endings, so there will be no unpleasant sensations. Those with a urostomy will probably be unaware of any kidney drainage.   Will you be a captive of the toilet?  Immediately post-op you will spend more time in the bathroom than you will after your body recovers from surgery. Every person is different, but on average  those with an ileostomy or urostomy may empty their pouches 4 to 6 times a day; a little  less if you have a colostomy. The average wear time between pouch system changes is 3 to 5 days and the changing process should take less than 30 minutes.   Will I need to be on a special diet? Most people return to their normal diet when they have recovered from surgery. Be sure to chew your food well, eat a well-balanced diet and drink plenty of fluids. If you experience problems with a certain food, wait a couple of weeks and try it again.  Will there be odor and noises? Pouching systems are designed to be odor-proof or odor-resistant. There are deodorants that can be used in the pouch. Medications are also available to help reduce odor. Limit gas-producing foods and carbonated beverages. You will experience less gas and fewer noises as you heal from surgery.  How much time will it take to care for my ostomy? At first, you may spend a lot of time learning about your ostomy and how to take care of it. As you become more comfortable and skilled at changing the pouching system, it will take very little time to care for it.   Will I be able to return to work? People with ostomies can perform most jobs. As soon as you have healed from surgery, you should be able to return to work. Heavy lifting (more than 10 pounds) may be discouraged.   What about intimacy? Sexual relationships and intimacy are important and fulfilling aspects of your life. They should continue after ostomy surgery. Intimacy-related concerns should be discussed openly between you and your partner.   Can I wear regular clothing? You do not need to wear special clothing. Ostomy pouches are fairly flat and barely noticeable. Elastic undergarments will not hurt the stoma or prevent the ostomy from functioning.   Can I participate in sports? An ostomy should not limit your involvement in sports. Many people with ostomies are runners, skiers,  swimmers or participate in other active lifestyles. Talk with your caregiver first before doing heavy physical activity.  Will you starve?  Not if you follow doctor's orders at each stage of your post-op adjustment. There is no such thing as an "ostomy diet". Some people with an ostomy will be able to eat and tolerate anything; others may find diffi-culty with some foods. Each person is an individual and must determine, by trial, what is best for them. A good practice  for all is to drink plenty of water .   Will you be a social outcast?  Have you met anyone who has an ostomy and is a social outcast? Why should you be the first? Only your attitude and self image will effect how you are treated. No confi-dent person is an Investment banker, corporate.    PROFESSIONAL HELP   Resources are available if you need help or have questions about your ostomy.   Specially trained nurses called Wound, Ostomy Continence Nurses (WOCN) are available for consultation in most major medical centers.  Consider getting an ostomy consult at an outpatient ostomy clinic.   [place] has an Ostomy Clinic run by an Chartered certified accountant at the [facility].  [phone]. [facility] can help set up an appointment   The [employer] (UOA) is a group made up of many local chapters throughout the [place] . These local groups hold meetings and provide support to prospective and existing ostomates. They sponsor educational events and have qualified visitors to make personal or telephone visits. Contact the UOA for the chapter nearest you and for other educational publications.  More detailed information can be found in Colostomy Guide, a publication of the [employer] (UOA). Contact UOA at 1-908-282-9665 or visit their web site at YellowSpecialist.at. The website contains links to other sites, suppliers and resources.  Media planner Start Services: Start at the website to enlist for support.   Your Wound Ostomy (WOCN) nurse may have started this process. [email or web address] Secure Start services are designed to support people as they live their lives with an ostomy or neurogenic bladder. Enrolling is easy and at no cost to the patient. We realize that each person's needs and life journey are different. Through Secure Start services, we want to help people live their life, their way.  #######################################################

## 2024-04-03 NOTE — Consult Note (Addendum)
 WOC Nurse ostomy follow up Stoma type/location: RLQ ileostomy. Stomal assessment/size: Stoma slight above the skin level, opening facing the skin at 6 o'clock.  Peristomal assessment: intact, ostomy constructed in a crease. Pt is already follow by ostomy clinic. He request the ring, belt and extenders well educated. Treatment options for stomal/peristomal skin: Ring [id number], belt #621 Output bag was full when I visited him. Ostomy pouching: 1pc. #[id number]. Education provided: Pt is aware about ostomy care, He didn't have any problem with leaking since yesterday. - Changed his bag, cut the waffle as the ostomy size and shape. - Applied the ring surrounding the ostomy and filling the crease. - Used the belt to hold the system. - Extenders barriers are not a standard product, substitute for pinc tape.  Enrolled patient in [place] Secure Start Discharge program: Already is.  WOC Nurse wound follow up Wound type: Surgical dehiscence Measurement: 6 cm x 3.5 cm x 0.3 cm. Wound bed: 100% red. Drainage (amount, consistency, odor) Moderate, leaking a clear fuid. The ostomy was leaking towards the wound. Periwound: intact. Drain on the 7 o'clock position. Dressing procedure/placement/frequency: Wet to dry dressing. Cover with Kerlix soak the fist laywer with saline. Use abd pad or Kerlix, covering the wound and the drain site. Tape it.  Pt is ready to discharge.  WOC team will not plan to follow further.  Please reconsult if further assistance is needed. Thank-you,  Lela Holm BSN, RN, [employer], WOC  (Pager: [phone])

## 2024-04-03 NOTE — Consult Note (Signed)
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   By: Juliene Balder M.D.   On: 03/24/2024 16:32   CT ABDOMEN PELVIS W CONTRAST Result Date: 03/07/2024 CLINICAL DATA:  Acute abdominal pain EXAM: CT ABDOMEN AND PELVIS WITH CONTRAST TECHNIQUE: Multidetector CT imaging of the abdomen and pelvis was performed using the standard protocol following bolus administration of intravenous contrast. RADIATION DOSE REDUCTION: This exam was performed according to the departmental dose-optimization program which includes automated exposure control, adjustment of the mA and/or kV according to patient size and/or use of iterative reconstruction technique. CONTRAST:  OMNIPAQUE  IOHEXOL  300 MG/ML  SOLN COMPARISON:  01/29/2024, 01/28/2024 FINDINGS: Lower chest: Mild bibasilar atelectatic changes are noted. Hepatobiliary: No focal liver abnormality is seen. No gallstones, gallbladder wall thickening, or biliary dilatation. Pancreas: Unremarkable. No pancreatic  ductal dilatation or surrounding inflammatory changes. Spleen: Normal in size without focal abnormality. Adrenals/Urinary Tract: Adrenal glands are within normal limits. Kidneys show a normal enhancement pattern bilaterally. No renal calculi or obstructive changes are seen. Normal excretion is noted on delayed images. The bladder is partially distended. 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 Changes of diverticulosis are again noted. 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 No small bowel or gastric obstructive changes are seen. Vascular/Lymphatic: Aortic atherosclerosis. No enlarged abdominal or pelvic lymph nodes. Reproductive: Prostate is unremarkable. Other: Drainage catheter is again noted just above the bladder. Previously seen mixed attenuation fluid collection has resolved following the catheter placement. No new focal fluid collection is seen. Musculoskeletal: No acute or significant osseous findings. IMPRESSION: Status post CT-guided drainage catheter placement with resolution of the large mixed attenuation pelvic fluid collection. 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 Mild bibasilar atelectasis. Electronically Signed   By: Oneil Devonshire M.D.   On: 03/07/2024 03:50    Medications / Allergies: per chart  Antibiotics: Anti-infectives (From admission, onward)    None         Note: Portions of this report may have been transcribed using voice recognition software. Every effort was made to ensure accuracy; however, inadvertent computerized transcription errors may be present.   Any transcriptional errors that result from this process are unintentional.    Elspeth KYM Schultze, MD, FACS, MASCRS Esophageal, Gastrointestinal & Colorectal Surgery Robotic and Minimally Invasive Surgery  [facility] [address], [address] [place], [place] [address] [phone] Fax [phone] Main  CONTACT  INFORMATION: Weekday (9AM-5PM): Call [facility] main office at [phone] Weeknight (5PM-9AM) or Weekend/Holiday: Check EPIC Web Links tab & use AMION (password  [email or web address]) for General Surgery [facility] coverage  Please, DO NOT use SecureChat  (it is not reliable communication to reach operating surgeons & will lead to a delay in care).   Epic staff messaging available for outptient concerns needing 1-2  business day response.       04/03/2024  7:52 AM

## 2024-04-03 NOTE — ED Provider Notes (Signed)
 Care assumed from Dr. 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 He is pending CT of abdomen/pelvis, labs. If no evisceration, plan is for outpatient follow-up. May need TOC consult.  CT scan shows no evidence of evisceration.  Have independently viewed the images, and agree with radiologist's interpretation.  He will need to work with wound management.   Patient is very concerned that he cannot take care of the wound at home.  I am consulting transitions of care to try to set up appropriate home health services for him.  Per social services, his insurance would not cover home health services.  They are working to refer him to wound management.  Results for orders placed or performed during the hospital encounter of 04/02/24  CBC with Differential   Collection Time: 04/03/24  2:00 AM  Result Value Ref Range   WBC 5.3 4.0 - 10.5 K/uL   RBC 3.05 (L) 4.22 - 5.81 MIL/uL   Hemoglobin 9.3 (L) 13.0 - 17.0 g/dL   HCT 70.3 (L) 60.9 - 47.9 %   MCV 97.0 80.0 - 100.0 fL   MCH 30.5 26.0 - 34.0 pg   MCHC 31.4 30.0 - 36.0 g/dL   RDW 81.7 (H) 88.4 - 84.4 %   Platelets 233 150 - 400 K/uL   nRBC 0.0 0.0 - 0.2 %   Neutrophils Relative % 45 %   Neutro Abs 2.4 1.7 - 7.7 K/uL   Lymphocytes Relative 29 %   Lymphs Abs 1.5 0.7 - 4.0 K/uL   Monocytes Relative 13 %   Monocytes Absolute 0.7 0.1 - 1.0 K/uL   Eosinophils Relative 13 %   Eosinophils Absolute 0.7 (H) 0.0 - 0.5 K/uL   Basophils Relative 0 %   Basophils Absolute 0.0 0.0 - 0.1 K/uL   Immature Granulocytes 0 %   Abs Immature Granulocytes 0.01 0.00 - 0.07 K/uL  Comprehensive metabolic panel   Collection Time: 04/03/24  2:00 AM  Result Value Ref Range   Sodium 139 135 - 145 mmol/L   Potassium 3.5 3.5 - 5.1 mmol/L   Chloride 109 98 - 111 mmol/L   CO2 21 (L) 22 - 32 mmol/L   Glucose, Bld 97 70 - 99 mg/dL   BUN 13 8 - 23 mg/dL   Creatinine, Ser 9.17 0.61 - 1.24 mg/dL   Calcium  9.4 8.9 - 10.3 mg/dL   Total Protein 7.1 6.5 - 8.1  g/dL   Albumin  3.2 (L) 3.5 - 5.0 g/dL   AST 18 15 - 41 U/L   ALT 12 0 - 44 U/L   Alkaline Phosphatase 61 38 - 126 U/L   Total Bilirubin 0.5 0.0 - 1.2 mg/dL   GFR, Estimated >39 >39 mL/min   Anion gap 9 5 - 15  Lipase, blood   Collection Time: 04/03/24  2:00 AM  Result Value Ref Range   Lipase 28 11 - 51 U/L   CT ABDOMEN PELVIS W CONTRAST Result Date: 04/03/2024 CLINICAL DATA:  Abdominal pain. Recent open laparotomy and colostomy with persistent open ventral wound. EXAM: CT ABDOMEN AND PELVIS WITH CONTRAST TECHNIQUE: Multidetector CT imaging of the abdomen and pelvis was performed using the standard protocol following bolus administration of intravenous contrast. RADIATION DOSE REDUCTION: This exam was performed according to the departmental dose-optimization program which includes automated exposure control, adjustment of the mA and/or kV according to patient size and/or use of iterative reconstruction technique. CONTRAST:  OMNIPAQUE  IOHEXOL  300 MG/ML  SOLN COMPARISON:  03/23/2024 FINDINGS: Lower chest:  Dependent atelectasis noted in both lower lungs. Nodular consolidative opacity in the left lower posterior costophrenic sulcus is probably atelectatic although infection is not excluded. Hepatobiliary: No suspicious focal abnormality within the liver parenchyma. Gallbladder is distended without calcified gallstones. No pericholecystic fluid. No intrahepatic or extrahepatic biliary dilation. Pancreas: No focal mass lesion. No dilatation of the main duct. No intraparenchymal cyst. No peripancreatic edema. Spleen: No splenomegaly. No suspicious focal mass lesion. Trace perisplenic fluid seen previously has resolved in the interval. Adrenals/Urinary Tract: No adrenal nodule or mass. Similar appearance of heterogeneous bilateral renal perfusion. No evidence for hydroureter. The urinary bladder appears normal for the degree of distention. Stomach/Bowel: Stomach is unremarkable. No gastric wall  thickening. No evidence of outlet obstruction. Duodenum is normally positioned as is the ligament of Treitz. No small bowel wall thickening. No small bowel dilatation. Status post right hemicolectomy with right abdominal end ileostomy. Vascular/Lymphatic: There is moderate atherosclerotic calcification of the abdominal aorta without aneurysm. There is no gastrohepatic or hepatoduodenal ligament lymphadenopathy. No retroperitoneal or mesenteric lymphadenopathy. No pelvic sidewall lymphadenopathy. Reproductive: The prostate gland and seminal vesicles are unremarkable. Other: No substantial intraperitoneal free fluid. Pigtail drainage catheter in the right paramidline anterior abdomen is stable. No demonstrable fluid collection around the tip of the catheter. Musculoskeletal: Open midline wound again noted. Similar prominent lucencies in the sub cortical bone of the left femoral head, potentially degenerative cysts or component of avascular necrosis. No worrisome lytic or sclerotic osseous abnormality. IMPRESSION: 1. Stable.  No new acute findings in the abdomen or pelvis. 2. Pigtail drainage catheter in the right paramidline anterior abdomen is stable. No demonstrable fluid collection around the tip of the catheter. 3.  Aortic Atherosclerosis (ICD10-I70.0). Electronically Signed   By: Camellia Candle M.D.   On: 04/03/2024 05:32   CT ABDOMEN PELVIS W CONTRAST Result Date: 03/25/2024 CLINICAL DATA:  History of colonic perforation and percutaneous drain was placed for postoperative fluid collection. History of partial colectomy and small bowel resection. EXAM: CT ABDOMEN AND PELVIS WITH CONTRAST TECHNIQUE: Multidetector CT imaging of the abdomen and pelvis was performed using the standard protocol following bolus administration of intravenous contrast. RADIATION DOSE REDUCTION: This exam was performed according to the departmental dose-optimization program which includes automated exposure control, adjustment of the mA  and/or kV according to patient size and/or use of iterative reconstruction technique. CONTRAST:  OMNIPAQUE  IOHEXOL  300 MG/ML  SOLN COMPARISON:  CT abdomen and pelvis 03/07/2024 FINDINGS: Lower chest: Persistent opacities in the posterior left lower lobe likely represent an area is scarring. No significant pleural fluid. Hepatobiliary: Gallbladder is decompressed. Normal appearance of the liver. No biliary dilatation. Portal venous system is patent. Pancreas: Unremarkable. No pancreatic ductal dilatation or surrounding inflammatory changes. Spleen: Trace fluid along the superior aspect of the spleen. Spleen is normal for size. No discrete splenic lesion. Adrenals/Urinary Tract: Normal adrenal glands. Normal appearance of the kidneys without hydronephrosis. No suspicious renal lesion. Urinary bladder is decompressed with mild wall thickening. Stomach/Bowel: Stomach is mildly distended with fluid/debris. Ileostomy in the right abdomen. Diverticula involving the descending colon without acute inflammatory changes. Portion of the sigmoid colon extends up to the anterior abdominal wall wound and may be adherent in this area. This configuration is similar to the previous examination. No evidence for bowel dilatation or obstruction. Vascular/Lymphatic: Diffuse atherosclerotic calcifications in the abdominal aorta and proximal femoral arteries. No evidence for an abdominal aortic aneurysm. No significant lymph node enlargement in the abdomen or pelvis. Reproductive: Prostate is unremarkable.  Other: Persistent edema in the presacral region. Difficult to exclude trace pelvic fluid. There is new trace fluid in left upper quadrant adjacent to the spleen. Again noted is an anterior abdominal wall wound and there are bowel loops just underneath the wound. Portion of the colon is likely adherent in this area. Again noted is a percutaneous drain positioned in the anterior lower abdomen at the midline. The drain enters from a  right anterior approach. There is no significant fluid collection around the drain. There is mild mesenteric edema near the drain. Small bowel loops surrounding the percutaneous drain. No suspicious intra-abdominal fluid or abscess collections at this time. Musculoskeletal: Disc narrowing at L4-L5 and L5-S1. Degenerative endplate changes in lower lumbar spine. Prominent subchondral cyst formations involving the left hip joint. These findings are similar to the previous examination. Difficult to exclude a component of avascular necrosis in this area. IMPRESSION: 1. Stable position of the percutaneous drain in the anterior lower abdomen. No significant fluid collection around the drain. 2. Trace fluid in the left upper quadrant adjacent to the spleen. 3. Anterior abdominal wall wound with bowel loops just underneath the wound. A portion of the sigmoid colon is likely adherent in this area. Postoperative changes as described. 4. Aortic Atherosclerosis (ICD10-I70.0). Electronically Signed   By: Juliene Balder M.D.   On: 03/25/2024 08:38   IR DRAIN/INJ MAJOR JOINT/BURSA Result Date: 03/24/2024 INDICATION: 63 year old with a percutaneous abdominal drain that was placed on 01/29/2024 for a postoperative fluid collection. Patient continues to have brown output from the drain. EXAM: DRAIN INJECTION WITH FLUOROSCOPY MEDICATIONS: 1% lidocaine  for local anesthetic ANESTHESIA/SEDATION: 1% lidocaine  COMPLICATIONS: None immediate. CONTRAST:  10 mL Omnipaque  300 PROCEDURE: Patient was placed supine on the fluoroscopic table. Drain was injected with contrast under fluoroscopy. The old suture was no longer intact. The skin around the drain was prepped with chlorhexidine . Sterile field was created. Skin was anesthetized with 1% lidocaine . Drain was secured to the skin with suture. Drain was attached to a gravity bag. FINDINGS: Drain injection demonstrates immediate filling of small bowel loops in the mid lower abdomen. Small irregular  collection left of the drain. IMPRESSION: 1. 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 2. Drain was switched to a gravity bag. 3. Drain clinic follow-up in 2 weeks with a drain injection. Electronically Signed   By: Juliene Balder M.D.   On: 03/24/2024 16:32   CT ABDOMEN PELVIS W CONTRAST Result Date: 03/07/2024 CLINICAL DATA:  Acute abdominal pain EXAM: CT ABDOMEN AND PELVIS WITH CONTRAST TECHNIQUE: Multidetector CT imaging of the abdomen and pelvis was performed using the standard protocol following bolus administration of intravenous contrast. RADIATION DOSE REDUCTION: This exam was performed according to the departmental dose-optimization program which includes automated exposure control, adjustment of the mA and/or kV according to patient size and/or use of iterative reconstruction technique. CONTRAST:  OMNIPAQUE  IOHEXOL  300 MG/ML  SOLN COMPARISON:  01/29/2024, 01/28/2024 FINDINGS: Lower chest: Mild bibasilar atelectatic changes are noted. Hepatobiliary: No focal liver abnormality is seen. No gallstones, gallbladder wall thickening, or biliary dilatation. Pancreas: Unremarkable. No pancreatic ductal dilatation or surrounding inflammatory changes. Spleen: Normal in size without focal abnormality. Adrenals/Urinary Tract: Adrenal glands are within normal limits. Kidneys show a normal enhancement pattern bilaterally. No renal calculi or obstructive changes are seen. Normal excretion is noted on delayed images. The bladder is partially distended. 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 Changes of diverticulosis are again noted. 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 No small bowel  or gastric obstructive changes are seen. Vascular/Lymphatic: Aortic atherosclerosis. No enlarged abdominal or pelvic lymph nodes. Reproductive: Prostate is unremarkable. Other: Drainage catheter is again noted just above the bladder. Previously seen mixed attenuation fluid collection has resolved following  the catheter placement. No new focal fluid collection is seen. Musculoskeletal: No acute or significant osseous findings. IMPRESSION: Status post CT-guided drainage catheter placement with resolution of the large mixed attenuation pelvic fluid collection. 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 Mild bibasilar atelectasis. Electronically Signed   By: Oneil Devonshire M.D.   On: 03/07/2024 03:50      Raford Lenis, MD 04/03/24 [phone]

## 2024-04-03 NOTE — Discharge Planning (Signed)
 RNCM consulted regarding home health establishment.  Pt insurance does not have home health coverage, pt will be referred to [facility].  Information placed on AVS to be reviewed by bedside EDRN prior to discharge.  Marshall Kampf J. Debarah, BSN, RN, NCM  Transitions of Care  Nurse Case Manager  [facility]  Operative Services  [phone]'

## 2024-04-04 MED ORDER — WARFARIN SODIUM 7.5 MG PO TABS: ORAL_TABLET | ORAL | 0 refills | Status: DC

## 2024-04-04 NOTE — Telephone Encounter (Signed)
 Pt [facility] pharmacy did not receive warfarin refill. Review of script reports it was set to no print. This did not transmit to pharmacy.   Resent warfarin refill.

## 2024-04-04 NOTE — Addendum Note (Signed)
 Addended by: ETHYL KIRSCH A on: 04/04/2024 07:09 PM   Modules accepted: Orders

## 2024-04-05 ENCOUNTER — Emergency Department (HOSPITAL_COMMUNITY)
Admission: EM | Admit: 2024-04-05 | Discharge: 2024-04-05 | Disposition: A | Discharge status: Home / Self Care | Attending: Emergency Medicine | Admitting: Emergency Medicine

## 2024-04-05 ENCOUNTER — Other Ambulatory Visit: Payer: Self-pay

## 2024-04-05 DIAGNOSIS — I10 Essential (primary) hypertension: Secondary | ICD-10-CM | POA: Diagnosis not present

## 2024-04-05 DIAGNOSIS — Z433 Encounter for attention to colostomy: Secondary | ICD-10-CM | POA: Diagnosis not present

## 2024-04-05 DIAGNOSIS — Z432 Encounter for attention to ileostomy: Secondary | ICD-10-CM | POA: Insufficient documentation

## 2024-04-05 MED ORDER — ACETAMINOPHEN 500 MG PO TABS
1000.0000 mg | ORAL_TABLET | Freq: Once | ORAL | Status: AC
  Administered 2024-04-05: 1000 mg via ORAL
  Filled 2024-04-05: qty 2

## 2024-04-05 NOTE — ED Triage Notes (Signed)
 Pt complaining of skin irritation around his ostomy bag. Pt does not have adequate supplies to manage his ostomy. Pt states that he has had leaking coming from the bag and it is causing skin break down. Pt denies fevers or infections to the area. Pt attempts to keep the area clean with powders and alcohol preps.

## 2024-04-05 NOTE — Consult Note (Addendum)
 WOC team consulted regarding 3rd ER visit for leaking ostomy and possible wound complications.  Seen by WOC RN 7/4, see that note   I spoke with ER nurse, ostomy extenders not on formulary at [facility].  Did recommend cleaning well between ostomy and wound, applying no sting barrier wipe (blue and white packet) and either using a flattened barrier ring Gerlean [phone] or pink tape (waterproof tape Gerlean [phone]) to provider a moisture barrier between wound and ostomy pouching system. Patient is using 1 piece convex Gerlean [id number] pouch.    I will reach out to Darice Cooley, NP at [facility] Monday 04/06/2024 to see if she can get patient back into the clinic for follow-up.  Per Dr. Sheldon note 04/03/2024 patient needs to follow-up with Dr. Tanda in their office for abdominal wound.    Thank you,    Powell Bar MSN, RN-BC, [employer]

## 2024-04-05 NOTE — Discharge Instructions (Addendum)
 You need to follow-up with wound care in the ostomy clinic.  Wound care will work you in tomorrow.  Please make sure you call first thing in the morning to schedule an appointment.  I have included the information in the paperwork.  Please make sure you call.  Please make sure that you are staying on top of your care and calling the appropriate specialties.  Please make sure that you are following with a general surgeon to continue your care.  If you have any concerns with new or worsening symptoms, please return your nearest emergency department for evaluation.

## 2024-04-05 NOTE — ED Provider Notes (Signed)
 [place] EMERGENCY DEPARTMENT AT [facility] Provider Note   CSN: [id number] Arrival date & time: 04/05/24  1005     Patient presents with: Ostomy bag issues   Alexander [name] is a 63 y.o. male with hypertension, ileostomy bag presents to the emergency department today requesting ostomy supplies.  Patient was just seen 2 days prior reports he has used all the bags that they gave him.  He reports that his bag is full and has been having leaking around it which is causing the wound around his ostomy to be irritated.  He is here requesting additional supplies.  He denies any worsening or change in his condition from his visit 2 days ago. HPI     Prior to Admission medications   Medication Sig Start Date End Date Taking? Authorizing Provider  alum & mag hydroxide-simeth (MAALOX/MYLANTA) 200-200-20 MG/5ML suspension Take 15 mLs by mouth every 6 (six) hours as needed for indigestion or heartburn. 02/10/24   Danton Reyes DASEN, MD  diclofenac  Sodium (VOLTAREN ) 1 % GEL Apply 2 g topically 4 (four) times daily. 02/10/24   Danton Reyes DASEN, MD  feeding supplement (ENSURE ENLIVE / ENSURE PLUS) LIQD Take 237 mLs by mouth 2 (two) times daily between meals. 02/10/24   Danton Reyes DASEN, MD  ferrous sulfate  325 (65 FE) MG tablet Take 1 tablet (325 mg total) by mouth daily with breakfast. 02/11/24   Danton Reyes DASEN, MD  gabapentin  (NEURONTIN ) 100 MG capsule Take 100 mg by mouth at bedtime. 03/25/24   [provider]  loperamide  (IMODIUM ) 2 MG capsule Take 2 capsules (4 mg total) by mouth 4 (four) times daily. 02/10/24   Danton Reyes DASEN, MD  methocarbamol  (ROBAXIN ) 500 MG tablet Take 500 mg by mouth every 6 (six) hours as needed. 03/25/24   [provider]  Multiple Vitamin (MULTIVITAMIN WITH MINERALS) TABS tablet Take 1 tablet by mouth daily. 02/11/24   Danton Reyes DASEN, MD  oxyCODONE  (ROXICODONE ) 5 MG immediate release tablet Take 1 tablet (5 mg total) by mouth every 6 (six)  hours as needed for severe pain (pain score 7-10). 04/01/24   [name], PA  pantoprazole  (PROTONIX ) 40 MG tablet Take 1 tablet (40 mg total) by mouth 2 (two) times daily. 02/10/24   Danton Reyes DASEN, MD  polycarbophil (FIBERCON) 625 MG tablet Take 2 tablets (1,250 mg total) by mouth 2 (two) times daily. 02/10/24   Danton Reyes DASEN, MD  traZODone  (DESYREL ) 100 MG tablet Take 1 tablet (100 mg total) by mouth at bedtime. 04/01/24   Worley, Samantha, PA  warfarin (COUMADIN ) 7.5 MG tablet TAKE 1 TABLET BY MOUTH DAILY OR AS DIRECTED BY ANTICOAGULATION CLINIC 04/04/24   [name], PA    Allergies: Patient has no known allergies.    Review of Systems  Constitutional:  Negative for chills and fever.    Updated Vital Signs BP 129/81   Pulse 87   Temp 98.4 F (36.9 C) (Oral)   Resp 16   SpO2 100%   Physical Exam Vitals and nursing note reviewed.  Constitutional:      General: He is not in acute distress.    Appearance: He is not toxic-appearing.     Comments: Lying on stretcher no acute distress.  HENT:     Mouth/Throat:     Mouth: Mucous membranes are moist.  Eyes:     General: No scleral icterus. Pulmonary:     Effort: Pulmonary effort is normal. No respiratory distress.  Abdominal:  Palpations: Abdomen is soft.     Comments: Ostomy present in the right mid abdomen.  There is large wound present in the midline to the left of the ileostomy.  And smaller one closer to the stoma (likely from recent drain removal?).  Please see attached image.   Skin:    General: Skin is dry.  Neurological:     Mental Status: He is alert.     (all labs ordered are listed, but only abnormal results are displayed) Labs Reviewed - No data to display  EKG: None  Radiology: No results found.  Procedures   Medications Ordered in the ED - No data to display   Medical Decision Making Risk OTC drugs.   63 y.o. male presents to the ER today for evaluation of needing ostomy  supplies.  Vital signs unremarkable. Physical exam as noted above.   On previous chart evaluation, patient was seen on 7 - 3 - 25 and presented for the wounds on his ostomy.  He underwent CT scan and labs.  It was read as stable without any new or acute findings.  There is a pigtail drainage catheter in the right paramidline anterior abdomen is stable with no demonstratable fluid collection around the tip of the catheter.  From looking at the previous images, it does appear to be stable.  Remaining the previous note, they attempted to get him home health services however with his insurance there was not available to him.  They are trying to get him set up with wound management. Additionally, [facility] consulted with the patient on 04/03/24 and recommended close outpatient follow up.   I will place an additional TOC order for this.  Will see if we can send him home with a few ostomy supplies as well.  It appears from any his primary care note that he was seen at the ostomy clinic previously.  Will need to follow-up with them. Our charge nurse spoke with the wound care team who can get him in tomorrow and requests that the patient call. It was reported that the patient is already established there, however I am unable to see their outside notes. Wound dressing instructions were given to nursing staff from wound care.    We removed the tape and dressing from the area. The patient has gastric contents leaking from the bag and going into the midline wound. I do not see any gastric contents starting in the midline wound. I stressed to the importance of following up and keeping to his appointments. Stressed the importance of keeping these appointments. He was given additional bags here. Nursing applied bandage to midline wound.  Patient is hemodynamically stable, I do not see any additional workup needed at this time.  Stable for discharge home with close outpatient follow up.   We discussed plan at  bedside. We discussed strict return precautions and red flag symptoms. The patient verbalized their understanding and agrees to the plan. The patient is stable and being discharged home in good condition.  Portions of this report may have been transcribed using voice recognition software. Every effort was made to ensure accuracy; however, inadvertent computerized transcription errors may be present.   Final diagnoses:  None    ED Discharge Orders     None          Bernis Ernst, DEVONNA 04/06/24 2301    Dean Clarity, MD 04/08/24 [phone]

## 2024-04-06 ENCOUNTER — Emergency Department (HOSPITAL_COMMUNITY): Admission: EM | Admit: 2024-04-06 | Discharge: 2024-04-06 | Disposition: A | Discharge status: Home / Self Care

## 2024-04-06 ENCOUNTER — Encounter (HOSPITAL_COMMUNITY): Payer: Self-pay

## 2024-04-06 ENCOUNTER — Other Ambulatory Visit: Payer: Self-pay | Admitting: Physician Assistant

## 2024-04-06 ENCOUNTER — Other Ambulatory Visit: Payer: Self-pay

## 2024-04-06 ENCOUNTER — Telehealth: Payer: Self-pay | Admitting: *Deleted

## 2024-04-06 DIAGNOSIS — K9403 Colostomy malfunction: Secondary | ICD-10-CM | POA: Diagnosis not present

## 2024-04-06 DIAGNOSIS — Z558 Other problems related to education and literacy: Secondary | ICD-10-CM

## 2024-04-06 DIAGNOSIS — T81321A Disruption or dehiscence of closure of internal operation (surgical) wound of abdominal wall muscle or fascia, initial encounter: Secondary | ICD-10-CM | POA: Diagnosis not present

## 2024-04-06 DIAGNOSIS — T85698A Other mechanical complication of other specified internal prosthetic devices, implants and grafts, initial encounter: Secondary | ICD-10-CM | POA: Diagnosis not present

## 2024-04-06 DIAGNOSIS — Z7901 Long term (current) use of anticoagulants: Secondary | ICD-10-CM

## 2024-04-06 MED ORDER — OXYCODONE HCL 5 MG PO TABS
5.0000 mg | ORAL_TABLET | Freq: Once | ORAL | Status: AC
  Administered 2024-04-06: 5 mg via ORAL
  Filled 2024-04-06: qty 1

## 2024-04-06 MED ORDER — ZINC OXIDE 40 % EX OINT
TOPICAL_OINTMENT | Freq: Once | CUTANEOUS | Status: AC
  Filled 2024-04-06: qty 57

## 2024-04-06 NOTE — ED Provider Notes (Signed)
 [place] EMERGENCY DEPARTMENT AT [facility] Provider Note   CSN: [id number] Arrival date & time: 04/06/24  1031     Patient presents with: colostomy bag issue   Alexander [name] is a 63 y.o. male.   This is a 63 year old man presenting emergency department for irritation around his colostomy site and leaking.  He is requesting wound care.  Was seen yesterday for the same and was told to follow-up with wound care today, but does not have appointment and notes that he could not wait as he has burning around his ostomy.  Normal output.        Prior to Admission medications   Medication Sig Start Date End Date Taking? Authorizing Provider  alum & mag hydroxide-simeth (MAALOX/MYLANTA) 200-200-20 MG/5ML suspension Take 15 mLs by mouth every 6 (six) hours as needed for indigestion or heartburn. 02/10/24   Danton Reyes DASEN, MD  diclofenac  Sodium (VOLTAREN ) 1 % GEL Apply 2 g topically 4 (four) times daily. 02/10/24   Danton Reyes DASEN, MD  feeding supplement (ENSURE ENLIVE / ENSURE PLUS) LIQD Take 237 mLs by mouth 2 (two) times daily between meals. 02/10/24   Danton Reyes DASEN, MD  ferrous sulfate  325 (65 FE) MG tablet Take 1 tablet (325 mg total) by mouth daily with breakfast. 02/11/24   Danton Reyes DASEN, MD  gabapentin  (NEURONTIN ) 100 MG capsule Take 100 mg by mouth at bedtime. 03/25/24   [provider]  loperamide  (IMODIUM ) 2 MG capsule Take 2 capsules (4 mg total) by mouth 4 (four) times daily. 02/10/24   Danton Reyes DASEN, MD  methocarbamol  (ROBAXIN ) 500 MG tablet Take 500 mg by mouth every 6 (six) hours as needed. 03/25/24   [provider]  Multiple Vitamin (MULTIVITAMIN WITH MINERALS) TABS tablet Take 1 tablet by mouth daily. 02/11/24   Danton Reyes DASEN, MD  oxyCODONE  (ROXICODONE ) 5 MG immediate release tablet Take 1 tablet (5 mg total) by mouth every 6 (six) hours as needed for severe pain (pain score 7-10). 04/01/24   [name], PA  pantoprazole   (PROTONIX ) 40 MG tablet Take 1 tablet (40 mg total) by mouth 2 (two) times daily. 02/10/24   Danton Reyes DASEN, MD  polycarbophil (FIBERCON) 625 MG tablet Take 2 tablets (1,250 mg total) by mouth 2 (two) times daily. 02/10/24   Danton Reyes DASEN, MD  traZODone  (DESYREL ) 100 MG tablet Take 1 tablet (100 mg total) by mouth at bedtime. 04/01/24   Worley, Samantha, PA  warfarin (COUMADIN ) 7.5 MG tablet TAKE 1 TABLET BY MOUTH DAILY OR AS DIRECTED BY ANTICOAGULATION CLINIC 04/04/24   [name], PA    Allergies: Patient has no known allergies.    Review of Systems  Updated Vital Signs BP 117/70   Pulse 87   Temp 97.9 F (36.6 C) (Oral)   Resp 16   Ht 5' 9 (1.753 m)   Wt 71 kg   SpO2 100%   BMI 23.11 kg/m   Physical Exam Vitals and nursing note reviewed.  Constitutional:      General: He is not in acute distress.    Appearance: He is not toxic-appearing.  HENT:     Head: Normocephalic.     Nose: Nose normal.  Cardiovascular:     Rate and Rhythm: Normal rate and regular rhythm.  Pulmonary:     Effort: Pulmonary effort is normal.  Abdominal:     General: Abdomen is flat.     Palpations: Abdomen is soft.  Comments: Some minor irritation around ostomy with wound just lateral.  Benign abdominal exam.  Stool in ostomy bag.  Stoma pink and well-perfused.  Musculoskeletal:        General: Normal range of motion.  Skin:    General: Skin is warm.     Capillary Refill: Capillary refill takes less than 2 seconds.  Neurological:     Mental Status: He is alert and oriented to person, place, and time.  Psychiatric:        Mood and Affect: Mood normal.        Behavior: Behavior normal.     (all labs ordered are listed, but only abnormal results are displayed) Labs Reviewed - No data to display  EKG: None  Radiology: No results found.   Procedures   Medications Ordered in the ED  oxyCODONE  (Oxy IR/ROXICODONE ) immediate release tablet 5 mg (5 mg Oral Given 04/06/24 1149)     Clinical Course as of 04/06/24 1602  Mon Apr 06, 2024  1252 Ct abd 7/4 :IMPRESSION: 1. Stable.  No new acute findings in the abdomen or pelvis. 2. Pigtail drainage catheter in the right paramidline anterior abdomen is stable. No demonstrable fluid collection around the tip of the catheter. 3.  Aortic Atherosclerosis (ICD10-I70.0).  [TY]  1253 PCP note on 7/2:  01/11/24-02/10/24. He presented to ER with bright red blood per rectum.  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  [TY]  1253 Saw in ED on 7/4 as well as yesterday for similar complaint.  [TY]  1425 Wound care has dressed patient's wound.  Has set up appointment for Friday.  Will discharge in stable condition. [TY]    Clinical Course User Index [TY] Neysa Caron PARAS, DO                                 Medical Decision Making Is a 63 year old male presenting emergency department with irritation around ostomy bag.  Afebrile nontachycardic hemodynamically stable.  Benign abdominal exam.  Here in the emergency department again today for the same.  Reports he was unable to get into see wound care today.  Benign abdominal exam.  Recent CT scan reassuring.  No change in symptoms.  Having good output.  Basic labs 2 days ago reassuring.  Low suspicion for acute intra-abdominal pathology at this time, low suspicion for systemic infection.  Wound care team consulted and will see patient.  Risk Prescription drug management.       Final diagnoses:  None    ED Discharge Orders     None          Neysa Caron PARAS, DO 04/06/24 1602

## 2024-04-06 NOTE — Telephone Encounter (Signed)
 Spoke to pt, told him he needs to go to the ED to have his wound evaluated. Pt said when is someone going to come help with wound. Told him we put in referral for Home Health and someone should get in contact with him. Also put in referral for Ostomy clinic and they should get in touch with you too. Pt verbalized understanding. Told him you need to go to the ED now. Pt verbalized understanding.

## 2024-04-06 NOTE — Telephone Encounter (Signed)
 Copied from CRM [id number]. Topic: Clinical - Medical Advice >> Apr 06, 2024  8:11 AM Mercedes MATSU wrote: Reason for CRM: Patient is requesting at home service for wound care. Patient is requesting a call back [phone]. Patent states he's going to the ER because his skin has turned red.

## 2024-04-06 NOTE — Discharge Instructions (Signed)
 Please follow-up with the wound clinic as instructed by the wound care nurse on Friday.

## 2024-04-06 NOTE — Telephone Encounter (Signed)
 Copied from CRM [id number]. Topic: Clinical - Medication Refill >> Apr 06, 2024  8:10 AM Tanazia G wrote: Medication:  warfarin (COUMADIN ) 7.5 MG tablet     Has the patient contacted their pharmacy? Yes (Agent: If no, request that the patient contact the pharmacy for the refill. If patient does not wish to contact the pharmacy document the reason why and proceed with request.) (Agent: If yes, when and what did the pharmacy advise?)  This is the patient's preferred pharmacy:  [id number] [address] [place], [place] - [address] [place] [address] Phone: [phone] Fax: [phone]  Is this the correct pharmacy for this prescription? Yes If no, delete pharmacy and type the correct one.   Has the prescription been filled recently? Yes  Is the patient out of the medication? Yes  Has the patient been seen for an appointment in the last year OR does the patient have an upcoming appointment? Yes  Can we respond through MyChart? Yes  Agent: Please be advised that Rx refills may take up to 3 business days. We ask that you follow-up with your pharmacy.

## 2024-04-06 NOTE — Consult Note (Signed)
 WOC Nurse ostomy consult note Stoma type/location: RLQ ileostomy Stomal assessment/size: 1  Peristomal assessment: moisture associated skin damage  Treatment options for stomal/peristomal skin: stoma powder skin prep barrier ring and 1 piece convex pouch with belt.  All have been ordered and received from [facility].   Output liquid green stool Ostomy pouching: 1pc.convex .  Education provided: He has presented to ED multiple times. His complaints are leaking pouch and wound care. He does not want to perform wound care and feels an ED visit will help him get [facility] services. He states his primary care physician is working on this as he has Medicaid now.   We have no record of the Medicaid enrollment info and his [place] coverage does not include HH services.  I explain to him the need to learn and perform his own wound care for now.   He has exposed bowel and Dr Sheldon recommended daily moist dressings with vaseline gauze or KY Kelly and gauze. This is effluent is leading to periwound breakdown and loss of ileostomy seal due to moisture.  He is not wearing the recommended ostomy belt. I provide a new one.   Enrolled patient in [employer] DC program: Yes WOC Nurse Consult Note: Reason for Consult:Midline surgical wound dehiscence with exposed viable loop of bowel.  Wound type:surgical dehiscence Pressure Injury POA: NA Measurement:7 cm x 5 cm x 0.3 cm  Wound azi:zkendzi loop of bowel  Drainage (amount, consistency, odor) moderate creamy effluent  Periwound: red denuded skin due to wound drainage  Dressing procedure/placement/frequency: Cleanse wound with NS and pat dry. Apply KY jelly moist gauze to wound bed to keep bowel moist  Top with gauze and ABD pad/tape.  Change daily. ED staff to teach patient to perform.  He has been covering with tape and ABD only.  Appointment in ostomy clinic for Friday 04/10/24 at 0900 to discuss ostomy and wound care further. I provide ostomy supplies.  May need  wound supplies and can discuss with ED staff.  Will not follow at this time.  Please re-consult if needed.  Darice Cooley MSN, RN, FNP-BC CWON Wound, Ostomy, Continence Nurse Outpatient [facility] [phone] Pager [phone]

## 2024-04-06 NOTE — ED Notes (Signed)
 Pt ostomy bag changed and wet-to-dry dressing applied to wound. Pt educated on how to do this at home and given ordered supplies.

## 2024-04-06 NOTE — ED Triage Notes (Signed)
 Pt BIB [facility] from home for colostomy bag issues. Pt reports it has been leaking and causing irritation around the site, was seen yesterday for same. VSS per EMS.

## 2024-04-07 DIAGNOSIS — Z932 Ileostomy status: Secondary | ICD-10-CM | POA: Diagnosis not present

## 2024-04-08 ENCOUNTER — Telehealth: Payer: Self-pay

## 2024-04-08 ENCOUNTER — Other Ambulatory Visit: Payer: Self-pay | Admitting: Physician Assistant

## 2024-04-08 NOTE — Telephone Encounter (Signed)
 Pt called to [facility] cou[facility]in  cl[facility] apt from 7/11 to 7/18 due to conflicting apts.  Review of chart reported pt in ER 3 times for ostomy care. INR was checked on 7/4 and was 2.6.  [facility]  clinic apt for 7/18. Pt reports he did pick up a new script of warfarin. Advised if any changes to contact coumadin  clinic. Pt verbalized understanding.

## 2024-04-08 NOTE — Telephone Encounter (Signed)
 Please deny prescriptions.

## 2024-04-08 NOTE — Telephone Encounter (Signed)
 Left message for patient to return call to schedule appointment to discuss the need for colonoscopy.  Will continue efforts.

## 2024-04-08 NOTE — Telephone Encounter (Signed)
-----   Message from Sandor LULLA Flatter sent at 04/06/2024  1:15 PM EDT ----- Regarding: FW: colonoscopy Can you please schedule this patient an appointment to discuss repeat colonoscopy prior to surgical reversal.  He technically belongs to Dr. Abran having 1 prior appointment in 2021.  However, patient was really seen by Vina and just staffed with Abran, so I am perfectly fine putting on my schedule or Dr. Nancyann, whoever has a sooner routine appointment Vilinda, I am assuming you do not mind either way either).   Thanks.   VC ----- Message ----- From: Tanda Locus, MD Sent: 04/06/2024   8:58 AM EDT To: Sandor Flatter LULLA, DO Subject: colonoscopy                                    Hi  Not sure if you remember this guy.  He was a consult for both of us  back in April.  He had presented with a right sided diverticulitis with a bleed.  He had never had prior colonoscopy.  He ended up perforating and having to go to the OR for a right colectomy with end ileostomy.  He is going to need reversal at some point over the next few months but was wondering if you could get him into your clinic to discuss colonoscopy of the remaining colon prior to reversal.  Thanks Locus

## 2024-04-08 NOTE — Telephone Encounter (Unsigned)
 Copied from CRM [id number]. Topic: Clinical - Medication Refill >> Apr 08, 2024  3:13 PM Macario HERO wrote: Medication: oxyCODONE  (ROXICODONE ) 5 MG immediate release tablet [[id number]] traZODone  (DESYREL ) 100 MG tablet [[id number]]  Has the patient contacted their pharmacy? No (Agent: If no, request that the patient contact the pharmacy for the refill. If patient does not wish to contact the pharmacy document the reason why and proceed with request.) (Agent: If yes, when and what did the pharmacy advise?)  This is the patient's preferred pharmacy:  [id number] [address], [place] - [address] [place] [address] Phone: [phone] Fax: [phone]  Is this the correct pharmacy for this prescription? Yes If no, delete pharmacy and type the correct one.   Has the prescription been filled recently? Yes  Is the patient out of the medication? Yes  Has the patient been seen for an appointment in the last year OR does the patient have an upcoming appointment? Yes  Can we respond through MyChart? No  Agent: Please be advised that Rx refills may take up to 3 business days. We ask that you follow-up with your pharmacy.

## 2024-04-09 ENCOUNTER — Inpatient Hospital Stay (HOSPITAL_COMMUNITY)
Admission: AD | Admit: 2024-04-09 | Discharge: 2024-04-30 | DRG: 907 | Disposition: A | Discharge status: Other Acute Inpatient Hospital | Attending: General Surgery | Admitting: General Surgery

## 2024-04-09 ENCOUNTER — Encounter (HOSPITAL_COMMUNITY): Payer: Self-pay

## 2024-04-09 ENCOUNTER — Telehealth: Payer: Self-pay | Admitting: *Deleted

## 2024-04-09 ENCOUNTER — Inpatient Hospital Stay (HOSPITAL_COMMUNITY)

## 2024-04-09 DIAGNOSIS — K660 Peritoneal adhesions (postprocedural) (postinfection): Secondary | ICD-10-CM | POA: Diagnosis present

## 2024-04-09 DIAGNOSIS — E8721 Acute metabolic acidosis: Secondary | ICD-10-CM | POA: Diagnosis present

## 2024-04-09 DIAGNOSIS — K529 Noninfective gastroenteritis and colitis, unspecified: Secondary | ICD-10-CM | POA: Diagnosis not present

## 2024-04-09 DIAGNOSIS — I739 Peripheral vascular disease, unspecified: Secondary | ICD-10-CM | POA: Diagnosis present

## 2024-04-09 DIAGNOSIS — Z87891 Personal history of nicotine dependence: Secondary | ICD-10-CM

## 2024-04-09 DIAGNOSIS — Z8249 Family history of ischemic heart disease and other diseases of the circulatory system: Secondary | ICD-10-CM

## 2024-04-09 DIAGNOSIS — R1084 Generalized abdominal pain: Secondary | ICD-10-CM | POA: Diagnosis present

## 2024-04-09 DIAGNOSIS — E871 Hypo-osmolality and hyponatremia: Secondary | ICD-10-CM | POA: Diagnosis not present

## 2024-04-09 DIAGNOSIS — K6289 Other specified diseases of anus and rectum: Secondary | ICD-10-CM | POA: Diagnosis not present

## 2024-04-09 DIAGNOSIS — E440 Moderate protein-calorie malnutrition: Secondary | ICD-10-CM | POA: Diagnosis present

## 2024-04-09 DIAGNOSIS — Z825 Family history of asthma and other chronic lower respiratory diseases: Secondary | ICD-10-CM | POA: Diagnosis not present

## 2024-04-09 DIAGNOSIS — T81321A Disruption or dehiscence of closure of internal operation (surgical) wound of abdominal wall muscle or fascia, initial encounter: Principal | ICD-10-CM | POA: Diagnosis present

## 2024-04-09 DIAGNOSIS — Z1211 Encounter for screening for malignant neoplasm of colon: Secondary | ICD-10-CM

## 2024-04-09 DIAGNOSIS — G4700 Insomnia, unspecified: Secondary | ICD-10-CM | POA: Diagnosis present

## 2024-04-09 DIAGNOSIS — E785 Hyperlipidemia, unspecified: Secondary | ICD-10-CM | POA: Diagnosis not present

## 2024-04-09 DIAGNOSIS — J9811 Atelectasis: Secondary | ICD-10-CM | POA: Diagnosis not present

## 2024-04-09 DIAGNOSIS — K5720 Diverticulitis of large intestine with perforation and abscess without bleeding: Secondary | ICD-10-CM | POA: Diagnosis present

## 2024-04-09 DIAGNOSIS — I700 Atherosclerosis of aorta: Secondary | ICD-10-CM | POA: Diagnosis present

## 2024-04-09 DIAGNOSIS — Z932 Ileostomy status: Secondary | ICD-10-CM

## 2024-04-09 DIAGNOSIS — J189 Pneumonia, unspecified organism: Secondary | ICD-10-CM | POA: Diagnosis not present

## 2024-04-09 DIAGNOSIS — Z7901 Long term (current) use of anticoagulants: Secondary | ICD-10-CM

## 2024-04-09 DIAGNOSIS — I749 Embolism and thrombosis of unspecified artery: Secondary | ICD-10-CM | POA: Diagnosis present

## 2024-04-09 DIAGNOSIS — R918 Other nonspecific abnormal finding of lung field: Secondary | ICD-10-CM | POA: Diagnosis not present

## 2024-04-09 DIAGNOSIS — R109 Unspecified abdominal pain: Secondary | ICD-10-CM | POA: Diagnosis not present

## 2024-04-09 DIAGNOSIS — M109 Gout, unspecified: Secondary | ICD-10-CM | POA: Diagnosis not present

## 2024-04-09 DIAGNOSIS — Z4682 Encounter for fitting and adjustment of non-vascular catheter: Secondary | ICD-10-CM | POA: Diagnosis not present

## 2024-04-09 DIAGNOSIS — Z823 Family history of stroke: Secondary | ICD-10-CM

## 2024-04-09 DIAGNOSIS — R509 Fever, unspecified: Secondary | ICD-10-CM | POA: Diagnosis not present

## 2024-04-09 DIAGNOSIS — K565 Intestinal adhesions [bands], unspecified as to partial versus complete obstruction: Secondary | ICD-10-CM | POA: Diagnosis not present

## 2024-04-09 DIAGNOSIS — K632 Fistula of intestine: Secondary | ICD-10-CM | POA: Diagnosis not present

## 2024-04-09 DIAGNOSIS — K641 Second degree hemorrhoids: Secondary | ICD-10-CM | POA: Diagnosis not present

## 2024-04-09 DIAGNOSIS — D638 Anemia in other chronic diseases classified elsewhere: Secondary | ICD-10-CM | POA: Diagnosis not present

## 2024-04-09 DIAGNOSIS — Z681 Body mass index (BMI) 19 or less, adult: Secondary | ICD-10-CM | POA: Diagnosis not present

## 2024-04-09 DIAGNOSIS — M40209 Unspecified kyphosis, site unspecified: Secondary | ICD-10-CM | POA: Diagnosis not present

## 2024-04-09 DIAGNOSIS — I509 Heart failure, unspecified: Secondary | ICD-10-CM | POA: Diagnosis not present

## 2024-04-09 DIAGNOSIS — K6389 Other specified diseases of intestine: Secondary | ICD-10-CM | POA: Diagnosis not present

## 2024-04-09 DIAGNOSIS — Z5982 Transportation insecurity: Secondary | ICD-10-CM

## 2024-04-09 DIAGNOSIS — K651 Peritoneal abscess: Secondary | ICD-10-CM | POA: Diagnosis not present

## 2024-04-09 DIAGNOSIS — Z9049 Acquired absence of other specified parts of digestive tract: Secondary | ICD-10-CM

## 2024-04-09 DIAGNOSIS — I272 Pulmonary hypertension, unspecified: Secondary | ICD-10-CM | POA: Diagnosis not present

## 2024-04-09 DIAGNOSIS — Z86711 Personal history of pulmonary embolism: Secondary | ICD-10-CM

## 2024-04-09 DIAGNOSIS — Z86718 Personal history of other venous thrombosis and embolism: Secondary | ICD-10-CM | POA: Diagnosis not present

## 2024-04-09 DIAGNOSIS — Y836 Removal of other organ (partial) (total) as the cause of abnormal reaction of the patient, or of later complication, without mention of misadventure at the time of the procedure: Secondary | ICD-10-CM | POA: Diagnosis not present

## 2024-04-09 DIAGNOSIS — Z833 Family history of diabetes mellitus: Secondary | ICD-10-CM

## 2024-04-09 DIAGNOSIS — I110 Hypertensive heart disease with heart failure: Secondary | ICD-10-CM | POA: Diagnosis not present

## 2024-04-09 DIAGNOSIS — Z79899 Other long term (current) drug therapy: Secondary | ICD-10-CM

## 2024-04-09 DIAGNOSIS — I10 Essential (primary) hypertension: Secondary | ICD-10-CM | POA: Diagnosis not present

## 2024-04-09 DIAGNOSIS — E876 Hypokalemia: Secondary | ICD-10-CM | POA: Diagnosis not present

## 2024-04-09 DIAGNOSIS — K5730 Diverticulosis of large intestine without perforation or abscess without bleeding: Secondary | ICD-10-CM | POA: Diagnosis not present

## 2024-04-09 DIAGNOSIS — I517 Cardiomegaly: Secondary | ICD-10-CM | POA: Diagnosis not present

## 2024-04-09 DIAGNOSIS — G479 Sleep disorder, unspecified: Secondary | ICD-10-CM | POA: Diagnosis present

## 2024-04-09 DIAGNOSIS — T81321D Disruption or dehiscence of closure of internal operation (surgical) wound of abdominal wall muscle or fascia, subsequent encounter: Secondary | ICD-10-CM | POA: Diagnosis not present

## 2024-04-09 DIAGNOSIS — Z432 Encounter for attention to ileostomy: Secondary | ICD-10-CM | POA: Diagnosis not present

## 2024-04-09 DIAGNOSIS — K219 Gastro-esophageal reflux disease without esophagitis: Secondary | ICD-10-CM | POA: Diagnosis not present

## 2024-04-09 DIAGNOSIS — K4020 Bilateral inguinal hernia, without obstruction or gangrene, not specified as recurrent: Secondary | ICD-10-CM | POA: Diagnosis not present

## 2024-04-09 DIAGNOSIS — D649 Anemia, unspecified: Secondary | ICD-10-CM | POA: Diagnosis not present

## 2024-04-09 LAB — COMPREHENSIVE METABOLIC PANEL WITH GFR
ALT: 12 U/L (ref 0–44)
AST: 15 U/L (ref 15–41)
Albumin: 3.3 g/dL — ABNORMAL LOW (ref 3.5–5.0)
Alkaline Phosphatase: 51 U/L (ref 38–126)
Anion gap: 8 (ref 5–15)
BUN: 14 mg/dL (ref 8–23)
CO2: 18 mmol/L — ABNORMAL LOW (ref 22–32)
Calcium: 9.1 mg/dL (ref 8.9–10.3)
Chloride: 113 mmol/L — ABNORMAL HIGH (ref 98–111)
Creatinine, Ser: 0.81 mg/dL (ref 0.61–1.24)
GFR, Estimated: >60 mL/min (ref >60)
Glucose, Bld: 108 mg/dL — ABNORMAL HIGH (ref 70–99)
Potassium: 3.9 mmol/L (ref 3.5–5.1)
Sodium: 139 mmol/L (ref 135–145)
Total Bilirubin: 0.5 mg/dL (ref 0.0–1.2)
Total Protein: 7.3 g/dL (ref 6.5–8.1)

## 2024-04-09 LAB — CBC
HCT: 30.8 % — ABNORMAL LOW (ref 39.0–52.0)
Hemoglobin: 9.6 g/dL — ABNORMAL LOW (ref 13.0–17.0)
MCH: 30.8 pg (ref 26.0–34.0)
MCHC: 31.2 g/dL (ref 30.0–36.0)
MCV: 98.7 fL (ref 80.0–100.0)
Platelets: 262 K/uL (ref 150–400)
RBC: 3.12 MIL/uL — ABNORMAL LOW (ref 4.22–5.81)
RDW: 18 % — ABNORMAL HIGH (ref 11.5–15.5)
WBC: 5.5 K/uL (ref 4.0–10.5)
nRBC: 0 % (ref 0.0–0.2)

## 2024-04-09 LAB — PROTIME-INR
INR: 3.1 — ABNORMAL HIGH (ref 0.8–1.2)
Prothrombin Time: 33.7 s — ABNORMAL HIGH (ref 11.4–15.2)

## 2024-04-09 LAB — MAGNESIUM: Magnesium: 1.7 mg/dL (ref 1.7–2.4)

## 2024-04-09 LAB — MRSA NEXT GEN BY PCR, NASAL: MRSA by PCR Next Gen: DETECTED — AB

## 2024-04-09 LAB — PHOSPHORUS: Phosphorus: 3.5 mg/dL (ref 2.5–4.6)

## 2024-04-09 MED ORDER — IOHEXOL 300 MG/ML  SOLN
100.0000 mL | Freq: Once | INTRAMUSCULAR | Status: AC | PRN
  Administered 2024-04-09: 100 mL via INTRAVENOUS

## 2024-04-09 MED ORDER — ACETAMINOPHEN 650 MG RE SUPP: 650.0000 mg | Freq: Four times a day (QID) | RECTAL | Status: DC | PRN

## 2024-04-09 MED ORDER — METHOCARBAMOL 500 MG PO TABS
500.0000 mg | ORAL_TABLET | Freq: Four times a day (QID) | ORAL | Status: DC | PRN
  Administered 2024-04-09 – 2024-04-11 (×3): 500 mg via ORAL
  Filled 2024-04-09 (×3): qty 1

## 2024-04-09 MED ORDER — BOOST / RESOURCE BREEZE PO LIQD CUSTOM
1.0000 | Freq: Three times a day (TID) | ORAL | Status: DC
  Administered 2024-04-10 – 2024-04-12 (×9): 1 via ORAL

## 2024-04-09 MED ORDER — ACETAMINOPHEN 325 MG PO TABS
650.0000 mg | ORAL_TABLET | Freq: Four times a day (QID) | ORAL | Status: DC | PRN
  Administered 2024-04-09 – 2024-04-13 (×4): 650 mg via ORAL
  Filled 2024-04-09 (×4): qty 2

## 2024-04-09 MED ORDER — OXYCODONE HCL 5 MG PO TABS
5.0000 mg | ORAL_TABLET | Freq: Four times a day (QID) | ORAL | Status: DC | PRN
  Administered 2024-04-09 – 2024-04-10 (×2): 5 mg via ORAL
  Filled 2024-04-09 (×3): qty 1

## 2024-04-09 MED ORDER — ONDANSETRON HCL 4 MG PO TABS: 4.0000 mg | ORAL_TABLET | Freq: Four times a day (QID) | ORAL | Status: DC | PRN

## 2024-04-09 MED ORDER — ALUM & MAG HYDROXIDE-SIMETH 200-200-20 MG/5ML PO SUSP: 15.0000 mL | Freq: Four times a day (QID) | ORAL | Status: DC | PRN

## 2024-04-09 MED ORDER — HYDROMORPHONE HCL 1 MG/ML IJ SOLN
1.0000 mg | Freq: Once | INTRAMUSCULAR | Status: AC
  Administered 2024-04-09: 1 mg via INTRAVENOUS
  Filled 2024-04-09: qty 1

## 2024-04-09 MED ORDER — ONDANSETRON HCL 4 MG/2ML IJ SOLN
4.0000 mg | Freq: Once | INTRAMUSCULAR | Status: AC
  Administered 2024-04-09: 4 mg via INTRAVENOUS
  Filled 2024-04-09: qty 2

## 2024-04-09 MED ORDER — ONDANSETRON HCL 4 MG/2ML IJ SOLN: 4.0000 mg | Freq: Four times a day (QID) | INTRAMUSCULAR | Status: DC | PRN

## 2024-04-09 MED ORDER — GABAPENTIN 100 MG PO CAPS
100.0000 mg | ORAL_CAPSULE | Freq: Every day | ORAL | Status: DC
  Administered 2024-04-09 – 2024-04-12 (×4): 100 mg via ORAL
  Filled 2024-04-09 (×4): qty 1

## 2024-04-09 MED ORDER — PANTOPRAZOLE SODIUM 40 MG PO TBEC
40.0000 mg | DELAYED_RELEASE_TABLET | Freq: Two times a day (BID) | ORAL | Status: DC
  Administered 2024-04-09 – 2024-04-12 (×7): 40 mg via ORAL
  Filled 2024-04-09 (×7): qty 1

## 2024-04-09 MED ORDER — TRAZODONE HCL 50 MG PO TABS
100.0000 mg | ORAL_TABLET | Freq: Every day | ORAL | Status: DC
  Administered 2024-04-09 – 2024-04-12 (×4): 100 mg via ORAL
  Filled 2024-04-09 (×4): qty 1

## 2024-04-09 MED ORDER — IOHEXOL 9 MG/ML PO SOLN
ORAL | Status: AC
  Filled 2024-04-09: qty 1000
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
## 2024-04-09 NOTE — Telephone Encounter (Signed)
 Left message on voicemail to call office.

## 2024-04-09 NOTE — Progress Notes (Addendum)
 Pt seen & examined at [facility] 04/09/24 10:30-11:30   PROVIDER:  Itamar Mcgowan Alexander BLUSH, MD   MRN: [id number] DOB: [birth year] DATE OF ENCOUNTER: 04/09/2024  Interval History:    History of Present Illness Alexander [name] is a 63 year old male with a history of right colon perforation the setting of a GI bleed with right sided diverticulitis and underwent right colectomy urgently.  He was left open due to severe contamination.  He was taken back to the operating room the following morning due to bleeding.  He went back the following day for creation of end ileostomy and closure.  He had a protracted hospital stay.  He had a postoperative CT that showed a fluid collection underwent percutaneous drain placement by IR.  A follow-up drain injection showed communication with the small bowel the drain was left in place.  States that the last ED visit they removed the drain.  This is the first clinic visit he has made it to.  He has had multiple ED visits for difficulty pouching his ileostomy and for wound care supplies.  His midline was left open to heal by secondary intention.  Presents with abdominal pain and issues with ileostomy management.   He has been experiencing abdominal pain localized to the area of his ileostomy. The pain was present this morning, prompting him to seek medication for relief. He is able to ambulate with some difficulty and occasionally uses a walker for support.   He has undergone three surgeries related to a perforated right colon, which led to the removal of the right colon and the creation of an ileostomy. He has been facing challenges in maintaining the ileostomy pouch in place and is scheduled to see the ostomy nurse for further care.   No nausea or vomiting is reported, and his appetite is described as 'pretty good,' although he mentions some stomach discomfort.   He is currently on anticoagulation therapy due to a history of blood clots noted in the fall.    Past Medical History      Past Medical History:  Diagnosis Date   Anemia     Arthritis     Hypertension     Pulmonary embolism (CMS/HHS-HCC)        Past Surgical History       Past Surgical History:  Procedure Laterality Date   EXPLORATORY LAPAROTOMY   01/15/2024    Dr [name], also on 01/16/24, 01/18/24   HEMORRHOID SURGERY          Medications Ordered Prior to Encounter        Current Outpatient Medications on File Prior to Visit  Medication Sig Dispense Refill   ferrous sulfate  325 (65 FE) MG tablet Take 325 mg by mouth daily with breakfast       loperamide  (IMODIUM ) 2 mg capsule Take 4 mg by mouth       multivitamin with minerals tablet Take 1 tablet by mouth once daily       warfarin (COUMADIN ) 7.5 MG tablet Take 7.5 mg by mouth once daily        No current facility-administered medications on file prior to visit.      ROS - negative except for what is mentioned above   Physical Examination:    Physical Exam  BP 119/68   Pulse 103   Temp 36.6 C (97.9 F)   Wt 70.6 kg (155 lb 9.6 oz)   SpO2 99%  Gen: alert, NAD, non-toxic appearing Pupils: equal, no  scleral icterus Pulm: symmetric chest rise Abd: soft, nontender, nondistended.  Ileostomy in right lower quadrant.  Midline wound with exposed small bowel.  No signs of EC fistula.  No percutaneous drain Skin: no rash, no jaundice       Assessment and Plan:    Alexander [name] is a 63 y.o. male who underwent emergent right colectomy, placement of ABThera wound VAC, drainage of intra-abdominal abscess on April 16 complicated by bleeding, reexploration washout on April 17; reexploration, creation of end ileostomy, abdominal closure on April 19 .   Diagnoses and all orders for this visit:   S/P right colectomy -     oxyCODONE  (ROXICODONE ) 5 MG immediate release tablet; Take 1 tablet (5 mg total) by mouth every 8 (eight) hours as needed for Pain -     acetaminophen  (TYLENOL ) 500 MG tablet; Take 2 tablets (1,000 mg  total) by mouth every 8 (eight) hours for 10 days   H/O ileostomy   Dehiscence of postoperative wound of abdomen   Chronic anticoagulation   History of pulmonary embolus (PE)    Assessment & Plan Ileostomy with complications Ileostomy with complications, including a protruding loop of small intestine at risk of fistula formation. The ileostomy was created following a right colon perforation due to diverticulitis. Current complications necessitate urgent surgical intervention to prevent fistula or leakage. Adhesions complicate the procedure, increasing the risk of intestinal perforation and resection. Risk of anastomotic leak is 2-3%, with potential for bleeding, hernia, and cardiopulmonary events. Anticoagulation therapy requires careful management to prevent bleeding and thrombosis. - Schedule ileostomy reversal, exp lap, lysis of adhesions, possible bowel resection surgery on Monday, pending logistics. - Admit prior to surgery for anticoagulation management. -Check CBC, c-Met, coags, prealbumin - Keep small bowel moist with Vaseline gauze to prevent breakdown. - Consult WOC when admitted - ileostomy care - Discussed surgical risks: adhesions, anastomotic leak, bleeding, hernia. - Order CT scan abd/pelv with oral contrast to assess for leaks. -Order protein shakes, to supplement nutrition -GI consult to consider colonoscopy    He had had a percutaneous drain placed after surgery for an abscess.  Follow-up drain injection showed communication with the small bowel.  When he was seen in the ER last week he reported no drainage for a week so the drainage catheter was removed.  Right now there is no signs of a leak from having that drain removed at the bedside.  I do think it is reasonable to repeat his CT with oral and IV contrast prior to reversal surgery   His albumin  last week was 3.2   While I think it would be ideal to wait a little bit longer to reverse him I think him having exposed  small bowel in the midline poses a significant challenge and potential significant complication should it breakdown.  My concern is that there is a high risk that the small bowel will develop a fistula which may lead to significant hydration challenges, nutritional complications, and potential kidney issues since there is a large loop of small bowel exposed.   I have discussed the case with Dr. Ebbie, Dr. Stevie and Dr. Teresa who agree that probably going to the operating to reverse him sooner rather than later is probably the best option although not ideal option.   I discussed with the patient and a little bit later with his sister on the phone and a little bit later with his son on the phone his current anatomy and my recommendations along with the  risk of the procedure   I discussed that significant risk of injury to surrounding structures potentially a small bowel requiring small bowel resection, lysis of adhesions, incisional hernia, anastomotic leak, perioperative cardiac and pulmonary events, blood clot formation, need for additional procedures, prolonged hospitalization, postoperative ileus, small risk of death.   Risk of intestinal fistula Significant risk of intestinal fistula due to exposed loop of small intestine, with potential for intestinal wall breakdown and leakage.   Right colon perforation and diverticulitis Right colon perforation due to diverticulitis led to ileostomy creation. Managed with emergency surgery and right colon resection. Current plan involves reconnecting small intestine to remaining colon.  He has not had a colonoscopy.  We were in the process of trying to arrange an outpatient colonoscopy to surveilled the remaining transverse colon and distal colon but I think the logistics of that in the setting of his exposed small bowel is very complicated.  I have discussed the case with Dr. Abran to potentially consider colonoscopy this weekend pending their availability  before reversal surgery on Monday   Blood clot Blood clot in the fall, currently on Coumadin . Anticoagulation requires careful management around surgery to balance bleeding risk with thrombosis risk. - Stop Coumadin  prior to surgery and transition to heparin  for anticoagulation managemen or per TRH/pharmacy management.   I have discussed this case with 3 of my surgical partners, discussed it with the admitting [facility] hospitalist as well as GI medicine.  Appreciate the collaboration. This note has been created using automated tools and reviewed for accuracy by Wandalee Klang MCADAMS Karsten Vaughn.     No follow-ups on file.   Total time today 63 min The plan was discussed in detail with the patient today, who expressed understanding.  The patient has my contact information, and understands to call me with any additional questions or concerns in the interval.  I would be happy to see the patient back sooner if the need arises.      Alexander HERO. Tanda, MD FACS General, Minimally Invasive, & Bariatric Surgery       Contains text generated by [employer]  Electronically signed by [name] Alexander Armour, MD on 04/09/2024 12:58 PM

## 2024-04-09 NOTE — Consult Note (Addendum)
 WOC Nurse ostomy consult note; seen in person by ostomy nurse 04/03/2024 and 04/06/2024; also followed in ostomy clinic  Stoma type/location:  RLQ ileostomy  Stomal assessment/size: 1  Peristomal assessment:  had moisture associated skin damage when seen 04/06/2024  Treatment options for stomal/peristomal skin: stoma powder with skin prep for crusting, 2 barrier ring  Output  Ostomy pouching: 1pc  1 1/2 flexible convex pouch  Soila [phone]) 2 barrier ring Gerlean [phone], ostomy belt Gerlean [phone]  Education provided:  Enrolled patient in [employer] DC program: yes previously, per Darice Cooley, NP note 7/7 supplies have been ordered from [facility]   Sprinkle stoma powder Soila #6) over any irritated skin, brush away excess powder Tap lightly over the powder with Cavilon No-Sting barrier wipe (skin barrier wipe-white and blue package) Allow to dry Apply another layer of powder following the same steps up to three layers.  After dry proceed with routine pouching    Will place orders for supplies for ileostomy and plan for WOC nurse to evaluate patient on return to campus 04/10/2024.   Surgeon has placed wound care orders.    Thank you,    Powell Bar MSN, RN-BC, [employer]

## 2024-04-09 NOTE — Telephone Encounter (Signed)
 Copied from CRM [id number]. Topic: Clinical - Medication Question >> Apr 09, 2024  9:30 AM Wess [name] wrote: Reason for CRM: Patient would like to know why his traZODone  (DESYREL ) 100 MG tablet and oxyCODONE  (ROXICODONE ) 5 MG immediate release tablet is being denied.  Callback [phone]

## 2024-04-10 ENCOUNTER — Ambulatory Visit (HOSPITAL_COMMUNITY): Admitting: Nurse Practitioner

## 2024-04-10 ENCOUNTER — Ambulatory Visit

## 2024-04-10 DIAGNOSIS — Z7901 Long term (current) use of anticoagulants: Secondary | ICD-10-CM | POA: Diagnosis not present

## 2024-04-10 DIAGNOSIS — D649 Anemia, unspecified: Secondary | ICD-10-CM

## 2024-04-10 DIAGNOSIS — Z9049 Acquired absence of other specified parts of digestive tract: Secondary | ICD-10-CM

## 2024-04-10 DIAGNOSIS — T81321D Disruption or dehiscence of closure of internal operation (surgical) wound of abdominal wall muscle or fascia, subsequent encounter: Secondary | ICD-10-CM | POA: Diagnosis not present

## 2024-04-10 DIAGNOSIS — T81321A Disruption or dehiscence of closure of internal operation (surgical) wound of abdominal wall muscle or fascia, initial encounter: Secondary | ICD-10-CM | POA: Diagnosis not present

## 2024-04-10 LAB — COMPREHENSIVE METABOLIC PANEL WITH GFR
ALT: 11 U/L (ref 0–44)
AST: 14 U/L — ABNORMAL LOW (ref 15–41)
Albumin: 3.1 g/dL — ABNORMAL LOW (ref 3.5–5.0)
Alkaline Phosphatase: 54 U/L (ref 38–126)
Anion gap: 5 (ref 5–15)
BUN: 12 mg/dL (ref 8–23)
CO2: 21 mmol/L — ABNORMAL LOW (ref 22–32)
Calcium: 9.2 mg/dL (ref 8.9–10.3)
Chloride: 111 mmol/L (ref 98–111)
Creatinine, Ser: 0.78 mg/dL (ref 0.61–1.24)
GFR, Estimated: >60 mL/min (ref >60)
Glucose, Bld: 96 mg/dL (ref 70–99)
Potassium: 3.7 mmol/L (ref 3.5–5.1)
Sodium: 137 mmol/L (ref 135–145)
Total Bilirubin: 0.7 mg/dL (ref 0.0–1.2)
Total Protein: 6.9 g/dL (ref 6.5–8.1)

## 2024-04-10 LAB — PROTIME-INR
INR: 3.2 — ABNORMAL HIGH (ref 0.8–1.2)
Prothrombin Time: 33.8 s — ABNORMAL HIGH (ref 11.4–15.2)

## 2024-04-10 LAB — PREALBUMIN: Prealbumin: 21 mg/dL (ref 18–38)

## 2024-04-10 MED ORDER — PHYTONADIONE 5 MG PO TABS
2.5000 mg | ORAL_TABLET | Freq: Once | ORAL | Status: AC
  Administered 2024-04-10: 2.5 mg via ORAL
  Filled 2024-04-10: qty 1

## 2024-04-10 MED ORDER — OXYCODONE HCL 5 MG PO TABS
10.0000 mg | ORAL_TABLET | Freq: Four times a day (QID) | ORAL | Status: DC | PRN
  Administered 2024-04-10 – 2024-04-11 (×2): 10 mg via ORAL
  Filled 2024-04-10 (×3): qty 2

## 2024-04-10 MED ORDER — SODIUM CHLORIDE 0.9 % IV SOLN
2.0000 g | INTRAVENOUS | Status: AC
  Administered 2024-04-13: 2 g via INTRAVENOUS
  Filled 2024-04-10 (×2): qty 2

## 2024-04-10 MED ORDER — MORPHINE SULFATE (PF) 2 MG/ML IV SOLN
2.0000 mg | INTRAVENOUS | Status: DC | PRN
  Administered 2024-04-10 – 2024-04-13 (×16): 2 mg via INTRAVENOUS
  Filled 2024-04-10 (×16): qty 1

## 2024-04-10 NOTE — Progress Notes (Signed)
 PROGRESS NOTE    Alexander [name]  FMW:[id number] DOB: 06/08/1961 DOA: 04/09/2024 PCP: [name], PA   Brief Narrative:   63 y.o. 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  GI was also consulted for possible colonoscopy.  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  General surgery following and planning for possible surgical intervention on Monday.   -GI was also consulted for possible colonoscopy: Await recommendations.  History of PE/DVT on Coumadin  Supratherapeutic INR - Coumadin  on hold.  INR 3.2 today.  Coumadin  on hold.  Pharmacy following and patient received a dose of vitamin K  this morning.  Will require heparin  infusion once INR is less than 2.  Monitor INR.  Anemia of chronic disease - From chronic illnesses.  Hemoglobin stable.  Monitor intermittently  Acute metabolic acidosis -Mild.  Monitor  Essential hypertension Hyperlipidemia Aortic atherosclerosis -Monitor.  Blood pressure currently stable.  Not on any medications for hyperlipidemia at this time.  Outpatient follow-up with PCP  Gout - Asymptomatic  Sleep disturbance - Continue trazodone  at bedtime  Pulmonary hypertension-grade 1 diastolic dysfunction - Outpatient follow-up  DVT prophylaxis: Coumadin  on  hold. Code Status: Full Family Communication: None at bedside Disposition Plan: Status is: Inpatient Remains inpatient appropriate because: Of severity of illness  Consultants: General Surgery/GI  Procedures: None  Antimicrobials: None   Subjective: Patient seen and examined at bedside.  Complains of intermittent abdominal pain and wants his pain medication dose increased.  No fever, vomiting, chest pain reported.  Objective: Vitals:   04/09/24 1536 04/09/24 2212 04/10/24 0215 04/10/24 0649  BP: 117/66 107/64 123/76 132/71  Pulse: 77 71 73 75  Resp: 16 18  18   Temp: 98.8 F (37.1 C) 98.1 F (36.7 C) 98.2 F (36.8 C) 97.8 F (36.6 C)  TempSrc: Oral  Oral Oral  SpO2: 100% 100% 100% 100%  Weight: 70.8 kg     Height: 5' 9 (1.753 m)       Intake/Output Summary (Last 24 hours) at 04/10/2024 0803 Last data filed at 04/10/2024 0500 Gross per 24 hour  Intake --  Output 975 ml  Net -975 ml   Filed Weights   04/09/24 1536  Weight: 70.8 kg    Examination:  General exam: Appears calm and comfortable.   Respiratory system: Bilateral decreased breath sounds at bases Cardiovascular system: S1 & S2 heard, Rate controlled Gastrointestinal system: Abdomen is distended, soft and mildly tender.  Midline wound with exposed small bowel  extremities: No cyanosis, clubbing, edema  Central nervous system: Alert and oriented. No focal neurological deficits. Moving extremities Skin: No rashes, lesions or ulcers Psychiatry: Judgement and insight appear normal. Mood & affect appropriate.     Data Reviewed: I have personally reviewed following labs and imaging studies  CBC: Recent Labs  Lab 04/09/24 1542  WBC 5.5  HGB 9.6*  HCT 30.8*  MCV 98.7  PLT 262   Basic Metabolic Panel: Recent Labs  Lab 04/09/24 1542  04/09/24 1608 04/10/24 0518  NA  --  139 137  K  --  3.9 3.7  CL  --  113* 111  CO2  --  18* 21*  GLUCOSE  --  108* 96  BUN  --  14 12  CREATININE  --  0.81 0.78   CALCIUM   --  9.1 9.2  MG 1.7  --   --   PHOS 3.5  --   --    GFR: Estimated Creatinine Clearance: 95.7 mL/min (by C-G formula based on SCr of 0.78 mg/dL). Liver Function Tests: Recent Labs  Lab 04/09/24 1608 04/10/24 0518  AST 15 14*  ALT 12 11  ALKPHOS 51 54  BILITOT 0.5 0.7  PROT 7.3 6.9  ALBUMIN  3.3* 3.1*   No results for input(s): LIPASE, AMYLASE in the last 168 hours. No results for input(s): AMMONIA in the last 168 hours. Coagulation Profile: Recent Labs  Lab 04/03/24 0835 04/09/24 1542 04/10/24 0518  INR 2.6* 3.1* 3.2*   Cardiac Enzymes: No results for input(s): CKTOTAL, CKMB, CKMBINDEX, TROPONINI in the last 168 hours. BNP (last 3 results) No results for input(s): PROBNP in the last 8760 hours. HbA1C: No results for input(s): HGBA1C in the last 72 hours. CBG: No results for input(s): GLUCAP in the last 168 hours. Lipid Profile: No results for input(s): CHOL, HDL, LDLCALC, TRIG, CHOLHDL, LDLDIRECT in the last 72 hours. Thyroid Function Tests: No results for input(s): TSH, T4TOTAL, FREET4, T3FREE, THYROIDAB in the last 72 hours. Anemia Panel: No results for input(s): VITAMINB12, FOLATE, FERRITIN, TIBC, IRON , RETICCTPCT in the last 72 hours. Sepsis Labs: No results for input(s): PROCALCITON, LATICACIDVEN in the last 168 hours.  Recent Results (from the past 240 hours)  MRSA Next Gen by PCR, Nasal     Status: Abnormal   Collection Time: 04/09/24  4:19 PM   Specimen: Nasal Mucosa; Nasal Swab  Result Value Ref Range Status   MRSA by PCR Next Gen DETECTED (A) NOT DETECTED Final    Comment: (NOTE) The GeneXpert MRSA Assay (FDA approved for NASAL specimens only), is one component of a comprehensive MRSA colonization surveillance program. It is not intended to diagnose MRSA infection nor to guide or monitor treatment for MRSA infections. Test performance is not FDA approved in patients less than 80  years old. Performed at [facility], [address] [place], [place] [address]          Radiology Studies: CT ABDOMEN PELVIS W CONTRAST Result Date: 04/09/2024 CLINICAL DATA:  Acute, non localized abdominal pain. History of right colectomy and end ileostomy. IR injection of a drain in the abdomen on 03/24/2024 demonstrated communication of the area of the drain with a small bowel loop compatible with a small bowel fistula. EXAM: CT ABDOMEN AND PELVIS WITH CONTRAST TECHNIQUE: Multidetector CT imaging of the abdomen and pelvis was performed using the standard protocol following bolus administration of intravenous contrast. RADIATION DOSE REDUCTION: This exam was performed according to the departmental dose-optimization program which includes automated exposure control, adjustment of the mA and/or kV according to patient size and/or use of iterative reconstruction technique. CONTRAST:  OMNIPAQUE  IOHEXOL  300 MG/ML  SOLN COMPARISON:  04/03/2024 and IR drain injection dated 03/24/2024. FINDINGS: Lower chest: Heart size near the upper limit of normal. Small amount of residual atelectasis in the left lower lobe. Hepatobiliary: No focal liver abnormality is seen. No gallstones, gallbladder wall thickening, or biliary dilatation. Pancreas: Unremarkable. No pancreatic ductal dilatation or surrounding inflammatory changes.  Spleen: Normal in size without focal abnormality. Adrenals/Urinary Tract: Normal-appearing adrenal glands. Mild bilateral renal cortical scarring. Unremarkable ureters. Poorly distended urinary bladder with moderate diffuse wall thickening. Stomach/Bowel: Stomach distended with ingested material. Normal appearing small bowel with an ileostomy in place with normal passage of contrast through the small bowel and into the ostomy bag. Status post right and proximal transverse colon colectomy with an oversewn staple line at the proximal end of the distal transverse colon.  Multiple descending and sigmoid colon diverticula without evidence of diverticulitis. Vascular/Lymphatic: Atheromatous arterial calcifications without aneurysm. No enlarged lymph nodes. Reproductive: Prostate is unremarkable. Other: Open midline abdominal wound. Small bilateral inguinal hernias. No intra-abdominal fluid collections or extravasated contrast. 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 Lumbar and lower thoracic spine degenerative changes. IMPRESSION: 1. No acute abnormality. 2. Status post right and proximal transverse colon colectomy with an ileostomy in place. 3. No evidence of a persistent small-bowel fistula or leak on these images. 4. Descending and sigmoid colon diverticulosis. 5. Small bilateral inguinal hernias. 6. 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 Electronically Signed   By: Elspeth Bathe M.D.   On: 04/09/2024 19:56        Scheduled Meds:  feeding supplement  1 Container Oral TID BM   gabapentin   100 mg Oral QHS   pantoprazole   40 mg Oral BID   traZODone   100 mg Oral QHS   Continuous Infusions:        Sophie Mao, MD [facility] 04/10/2024, 8:03 AM '

## 2024-04-10 NOTE — Plan of Care (Signed)

## 2024-04-10 NOTE — Progress Notes (Signed)
 Subjective/Chief Complaint: No complaints.  No nausea or vomiting.  No significant abdominal pain.  Eating breakfast   Objective: Vital signs in last 24 hours: Temp:  [97.8 F (36.6 C)-98.8 F (37.1 C)] 97.8 F (36.6 C) (07/11 0649) Pulse Rate:  [71-77] 75 (07/11 0649) Resp:  [16-18] 18 (07/11 0649) BP: (107-132)/(64-76) 132/71 (07/11 0649) SpO2:  [100 %] 100 % (07/11 0649) Weight:  [70.8 kg] 70.8 kg (07/10 1536) Last BM Date : 04/09/24  Intake/Output from previous day: 07/10 0701 - 07/11 0700 In: -  Out: 975 [Urine:300; Stool:675] Intake/Output this shift: No intake/output data recorded.  Alert, nontoxic, non-ill-appearing Symmetric chest rise Ileostomy right lower quadrant with small bowel contents.  Midline wound is stable with exposed small bowel.  Lab Results:  Recent Labs    04/09/24 1542  WBC 5.5  HGB 9.6*  HCT 30.8*  PLT 262   BMET Recent Labs    04/09/24 1608 04/10/24 0518  NA 139 137  K 3.9 3.7  CL 113* 111  CO2 18* 21*  GLUCOSE 108* 96  BUN 14 12  CREATININE 0.81 0.78  CALCIUM  9.1 9.2   PT/INR Recent Labs    04/09/24 1542 04/10/24 0518  LABPROT 33.7* 33.8*  INR 3.1* 3.2*   ABG No results for input(s): PHART, HCO3 in the last 72 hours.  Invalid input(s): PCO2, PO2  Studies/Results: CT ABDOMEN PELVIS W CONTRAST Result Date: 04/09/2024 CLINICAL DATA:  Acute, non localized abdominal pain. History of right colectomy and end ileostomy. IR injection of a drain in the abdomen on 03/24/2024 demonstrated communication of the area of the drain with a small bowel loop compatible with a small bowel fistula. EXAM: CT ABDOMEN AND PELVIS WITH CONTRAST TECHNIQUE: Multidetector CT imaging of the abdomen and pelvis was performed using the standard protocol following bolus administration of intravenous contrast. RADIATION DOSE REDUCTION: This exam was performed according to the departmental dose-optimization program which includes automated  exposure control, adjustment of the mA and/or kV according to patient size and/or use of iterative reconstruction technique. CONTRAST:  OMNIPAQUE  IOHEXOL  300 MG/ML  SOLN COMPARISON:  04/03/2024 and IR drain injection dated 03/24/2024. FINDINGS: Lower chest: Heart size near the upper limit of normal. Small amount of residual atelectasis in the left lower lobe. Hepatobiliary: No focal liver abnormality is seen. No gallstones, gallbladder wall thickening, or biliary dilatation. Pancreas: Unremarkable. No pancreatic ductal dilatation or surrounding inflammatory changes. Spleen: Normal in size without focal abnormality. Adrenals/Urinary Tract: Normal-appearing adrenal glands. Mild bilateral renal cortical scarring. Unremarkable ureters. Poorly distended urinary bladder with moderate diffuse wall thickening. Stomach/Bowel: Stomach distended with ingested material. Normal appearing small bowel with an ileostomy in place with normal passage of contrast through the small bowel and into the ostomy bag. Status post right and proximal transverse colon colectomy with an oversewn staple line at the proximal end of the distal transverse colon. Multiple descending and sigmoid colon diverticula without evidence of diverticulitis. Vascular/Lymphatic: Atheromatous arterial calcifications without aneurysm. No enlarged lymph nodes. Reproductive: Prostate is unremarkable. Other: Open midline abdominal wound. Small bilateral inguinal hernias. No intra-abdominal fluid collections or extravasated contrast. 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 Lumbar and lower thoracic spine degenerative changes. IMPRESSION: 1. No acute abnormality. 2. Status post right and proximal transverse colon colectomy with an ileostomy in place. 3. No evidence of a persistent small-bowel fistula or leak on these images. 4. Descending and sigmoid colon diverticulosis. 5. Small bilateral inguinal  hernias. 6. 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 Electronically Signed   By: Elspeth Bathe M.D.   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  Prior to that GI is tentatively planning colonoscopy of his remaining colon since he has not had a prior colonoscopy to try to make sure nothing needs to be addressed with the distal colon at the time of his reversal.  Preliminary report is that they are tentatively planning this for Sunday assuming anesthesia coverage and INR reversal.  Greatly appreciate their support  He had had a postoperative CT demonstrating an abscess that he underwent percutaneous drain for.  A follow-up drain injection study in mid June showed connection to the small bowel.  When he was seen in the emergency room last week he reported no drainage from the percutaneous catheter so it was removed.  CT scan overnight with oral contrast demonstrated no extravasation  I discussed the steps of the procedure again with the patient this morning.  We discussed that it may be challenging to gain access to the abdomen, extensive adhesions with potential of enterotomy, potential need for small bowel resection, taking down his end ileostomy and connecting it to his transverse colon.  Discussed potential prolonged hospitalization due to ileus.  Discussed risk of surgery including but not limited to bleeding, infection, injury to surrounding structures, recurrent DVT PE, anastomotic leak, anastomotic stricture, wound infection,  incisional hernia, cardiac and pulmonary events, kidney injury, potential need for rehab, low risk of death.  Discussed the typical hospitalization.  History of DVT/PE on Coumadin -defer to [facility] and pharmacy.  His last dose was on Thursday morning.  INR still 3.2 this morning.  He will need to have some type of reversal definitely for surgery for Monday.  His INR will need to be corrected for his colonoscopy on Sunday if GI is still planning on doing that.  I am okay with a bridge  Chronic anemia-hemoglobin stable, will order type and screen  FEN-patient can continue on a diet.  Ordered supplemental protein shakes.  Prealbumin pending.  Patient will not need a bowel prep from above for his colonoscopy since his small bowel is disconnected from his colon.  Defer to GI on how they want to prep from below.  He will need to be n.p.o. after midnight on Sunday night for surgery for Monday morning  Wound-continue ostomy care to right lower quadrant ileostomy, cover exposed small bowel in the midline with Vaseline gauze and change every shift.  Keep bowel moist  Appreciate [facility] assistance  [name], MD, FACS General, Bariatric, & Minimally Invasive Surgery [facility],  A [facility]     LOS: 1 day    Camellia Tanda 04/10/2024

## 2024-04-10 NOTE — Telephone Encounter (Signed)
 Patient is currently admitted to the hospital and is scheduled for surgical resection on 04-13-24.

## 2024-04-10 NOTE — Consult Note (Addendum)
 WOC Nurse ostomy consult note Refer to previous WOC note for supplies. Pt is familiar to [facility] team since he had ileostomy surgery performed in April 2025, he has been seen 12 times by [facility] team for for complex ostomy care and assistance.  Pt plans to have ostomy reversal surgery performed on Monday and is currently followed by the surgical team.  Current pouch is leaking behind the barrier ring has also leaked into the abd wound.  Pouch change performed. Pt has a very particular routine and complex pouching situation. Instructions provided for bedside nurses to perform and 6 sets supplies ordered to the bedside for staff nurses' use.  Use supplies: barrier rings Gerlean # [phone] and convex ostomy pouch Lawson # [phone] and stoma power Gerlean # [phone], use with skin prep wipes, and belt Lawson # [id number] Stoma is red and viable, 1 inch, flush with skin level.  Crease located at 3:00 o'clock.  Peristoma skin with moisture associated skin damage to .2cm surrounding the stoma from previous leakage. Large amt liquid yellow stool. Pouch change performed per patient's instructions.  Bedside nurse; change pouch PRN if it is leaking. Refer to directions below if pouch is leaking: (PT WILL TELL YOU HOW TO PERFORM Remove previous pouch, cleanse with wash cloth and water .  Dry and then apply skin prep/ostomy power/more skin prep Take a small piece of barrier ring and roll it in your fingers to make a narrow snake and place it in the skin fold next to the stoma.  Try to flatten out the fold Apply another piece in the crease between ostomy stoma and abd wound.  Cut pouch opening to 1 inch and stretch and apply barrier ring to back of convex ostomy pouch Apply pouch over stoma and barrier ring and strips.  Cut hydrocolloid into 4 pieces and apply around the edges of the ostomy pouch like a picture frame.  Do this for 2 layers on the outer edges.  Apply white tape in 2 layers in the same manner Apply ostomy belt Apply vaseline  gauze to midline wound, cover with moist 4x4 and then dry 4x4/abd pads and tape  WOC Nurse wound Consult Note: Surgical team is following for assessment and plan of care for abd wound and has already provided topical treatment orders as listed above for bedside nurses to perform. .  Midline abd is full thickness 7X5X.3cm, located in a valley, red and moist with exposed bowel. Painful to touch.  Dressing change performed, since previous ostomy pouch had leaked stool into the wound. Supplies ordered to the room for staff nurse's use.  One hour spent performing this consult.   Thank-you,  Stephane Fought MSN, RN, CWOCN, [place], CNS [phone]
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
## 2024-04-10 NOTE — TOC Initial Note (Signed)
 Transition of Care [facility]) - Initial/Assessment Note    Patient Details  Name: Alexander [name] MRN: [id number] Date of Birth: 11-16-1960  Transition of [facility] CM/SW Contact:    Sonda Manuella Quill, RN Phone Number: 04/10/2024, 1:58 PM  Clinical Narrative:                 [place] w/ pt in room; pt says he shares a home w/ his sister; he plans to return at d/c; pt identified POC son Taden Witter [phone]); pt says family will provide transportation; he verified insurance/PCP; pt says he has difficulty paying rent; he denied experiencing other SDOH risks; pt says his PCP has walker, BSC, and shower chair have already been ordered by his PCP; pt says he does not have HH services, or home oxygen; pt agreed to receive resources for social services, and financial assistance; resources placed in d/c instructions; copies or resources also given to pt; he will make his own appt at agencies of choice; TOC is following.  Expected Discharge Plan: Home/Self Care Barriers to Discharge: Continued Medical Work up   Patient Goals and CMS Choice Patient states their goals for this hospitalization and ongoing recovery are:: home          Expected Discharge Plan and Services   Discharge Planning Services: CM Consult   Living arrangements for the past 2 months: Single Family Home                                      Prior Living Arrangements/Services Living arrangements for the past 2 months: Single Family Home Lives with:: Siblings Patient language and need for interpreter reviewed:: Yes Do you feel safe going back to the place where you live?: Yes      Need for Family Participation in Patient Care: Yes (Comment) Care giver support system in place?: Yes (comment) Current home services: DME (walker, BSC, shower chair) Criminal Activity/Legal Involvement Pertinent to Current Situation/Hospitalization: No - Comment as needed  Activities of Daily Living      Permission  Sought/Granted Permission sought to share information with : Case Manager Permission granted to share information with : Yes, Verbal Permission Granted  Share Information with NAME: Case Manager     Permission granted to share info w Relationship: Darwin Rothlisberger (son) [phone]     Emotional Assessment Appearance:: Appears stated age Attitude/Demeanor/Rapport: Gracious Affect (typically observed): Accepting Orientation: : Oriented to Self, Oriented to Place, Oriented to  Time, Oriented to Situation Alcohol / Substance Use: Not Applicable Psych Involvement: No (comment)  Admission diagnosis:  Abdominal wound dehiscence [T81.321A] Abdominal wall dehiscence [T81.321A] Patient Active Problem List   Diagnosis Date Noted   Abdominal wall dehiscence 04/09/2024   Perioperative dehiscence of abdominal wound with evisceration 04/03/2024   Ileostomy in place [facility]) 03/30/2024   Ileostomy care (HCC) 03/20/2024   Irritant contact dermatitis associated with fecal stoma 03/20/2024   Spontaneous perforation of colon (HCC) 01/15/2024   Ascites 01/15/2024   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 01/15/2024   Acute blood loss anemia 01/14/2024   AKI (acute kidney injury) (HCC) 01/11/2024   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   Thrombocytopenia (HCC) 07/13/2023   Pulmonary hypertension (HCC) 07/12/2023   Cor pulmonale (HCC) 07/12/2023   Noncompliance 01/22/2020   High risk medication use 10/30/2019   Hyperlipidemia 10/30/2019   Hepatic steatosis 10/08/2019   Spinal stenosis 10/08/2019   Aortic  atherosclerosis (HCC) 10/08/2019   Peripheral arterial disease (HCC) 10/08/2019   Sleep disturbance 10/08/2019   Diverticulosis 10/08/2019   Essential hypertension, benign 09/28/2019   Gout 09/28/2019   History of pulmonary embolus (PE) 04/20/2019   Chronic pain of right ankle 12/12/2016   Chronic foot pain, right 11/02/2012   PCP:  [name], PA Pharmacy:    [facility] [phone] - [place], [place] - [address] [place] [place] [address] Phone: [phone] Fax: [phone]     Social Drivers of Health (SDOH) Social History: SDOH Screenings   Food Insecurity: No Food Insecurity (04/10/2024)  Housing: High Risk (04/10/2024)  Transportation Needs: No Transportation Needs (04/10/2024)  Recent Concern: Transportation Needs - Unmet Transportation Needs (03/30/2024)  Utilities: Not At Risk (04/10/2024)  Depression (PHQ2-9): Low Risk  (07/24/2023)  Tobacco Use: Medium Risk (04/09/2024)   Received from [facility]   SDOH Interventions: Food Insecurity Interventions: Intervention Not Indicated, Inpatient TOC Housing Interventions: Inpatient TOC, Community Resources Provided Transportation Interventions: Intervention Not Indicated, Inpatient TOC Utilities Interventions: Intervention Not Indicated, Inpatient TOC   Readmission Risk Interventions    04/10/2024    1:55 PM 01/24/2024   10:43 AM 01/13/2024    9:13 AM  Readmission Risk Prevention Plan  Transportation Screening Complete Complete Complete  PCP or Specialist Appt within 3-5 Days  Complete Complete  HRI or Home Care Consult  Complete Complete  Social Work Consult for Recovery Care Planning/Counseling  Complete Complete  Palliative Care Screening  Not Applicable Not Applicable  Medication Review Ocean[employer]) Complete Complete Complete  PCP or Specialist appointment within 3-5 days of discharge Complete    HRI or Home Care Consult Complete    SW Recovery Care/Counseling Consult Complete    Palliative Care Screening Not Applicable    Skilled Nursing Facility Not Applicable

## 2024-04-10 NOTE — Progress Notes (Signed)
 Mobility Specialist - Progress Note   04/10/24 1034  Mobility  Activity Ambulated with assistance in hallway;Ambulated with assistance to bathroom  Level of Assistance Standby assist, set-up cues, supervision of patient - no hands on  Assistive Device Front wheel walker  Distance Ambulated (ft) 1000 ft  Activity Response Tolerated well  Mobility Referral Yes  Mobility visit 1 Mobility  Mobility Specialist Start Time (ACUTE ONLY) 0957  Mobility Specialist Stop Time (ACUTE ONLY) 1033  Mobility Specialist Time Calculation (min) (ACUTE ONLY) 36 min   Pt received in bed and agreeable to mobility. Prior to ambulating, pt requested assistance to the bathroom. No complaints during session. Pt to recliner after session with all needs met.    [facility]
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
## 2024-04-10 NOTE — Plan of Care (Signed)

## 2024-04-11 DIAGNOSIS — T81321D Disruption or dehiscence of closure of internal operation (surgical) wound of abdominal wall muscle or fascia, subsequent encounter: Secondary | ICD-10-CM | POA: Diagnosis not present

## 2024-04-11 DIAGNOSIS — Z9049 Acquired absence of other specified parts of digestive tract: Secondary | ICD-10-CM | POA: Diagnosis not present

## 2024-04-11 DIAGNOSIS — R1084 Generalized abdominal pain: Secondary | ICD-10-CM | POA: Diagnosis not present

## 2024-04-11 DIAGNOSIS — Z1211 Encounter for screening for malignant neoplasm of colon: Secondary | ICD-10-CM

## 2024-04-11 DIAGNOSIS — D649 Anemia, unspecified: Secondary | ICD-10-CM | POA: Diagnosis not present

## 2024-04-11 DIAGNOSIS — Z7901 Long term (current) use of anticoagulants: Secondary | ICD-10-CM | POA: Diagnosis not present

## 2024-04-11 LAB — CBC WITH DIFFERENTIAL/PLATELET
Abs Immature Granulocytes: 0.01 K/uL (ref 0.00–0.07)
Basophils Absolute: 0 K/uL (ref 0.0–0.1)
Basophils Relative: 0 %
Eosinophils Absolute: 0.6 K/uL — ABNORMAL HIGH (ref 0.0–0.5)
Eosinophils Relative: 11 %
HCT: 31.2 % — ABNORMAL LOW (ref 39.0–52.0)
Hemoglobin: 9.8 g/dL — ABNORMAL LOW (ref 13.0–17.0)
Immature Granulocytes: 0 %
Lymphocytes Relative: 23 %
Lymphs Abs: 1.1 K/uL (ref 0.7–4.0)
MCH: 30.4 pg (ref 26.0–34.0)
MCHC: 31.4 g/dL (ref 30.0–36.0)
MCV: 96.9 fL (ref 80.0–100.0)
Monocytes Absolute: 0.6 K/uL (ref 0.1–1.0)
Monocytes Relative: 12 %
Neutro Abs: 2.7 K/uL (ref 1.7–7.7)
Neutrophils Relative %: 54 %
Platelets: 260 K/uL (ref 150–400)
RBC: 3.22 MIL/uL — ABNORMAL LOW (ref 4.22–5.81)
RDW: 17.6 % — ABNORMAL HIGH (ref 11.5–15.5)
WBC: 5 K/uL (ref 4.0–10.5)
nRBC: 0 % (ref 0.0–0.2)

## 2024-04-11 LAB — BASIC METABOLIC PANEL WITH GFR
Anion gap: 4 — ABNORMAL LOW (ref 5–15)
BUN: 13 mg/dL (ref 8–23)
CO2: 22 mmol/L (ref 22–32)
Calcium: 9.5 mg/dL (ref 8.9–10.3)
Chloride: 111 mmol/L (ref 98–111)
Creatinine, Ser: 0.83 mg/dL (ref 0.61–1.24)
GFR, Estimated: >60 mL/min (ref >60)
Glucose, Bld: 102 mg/dL — ABNORMAL HIGH (ref 70–99)
Potassium: 4 mmol/L (ref 3.5–5.1)
Sodium: 137 mmol/L (ref 135–145)

## 2024-04-11 LAB — TYPE AND SCREEN
ABO/RH(D): B POS
Antibody Screen: NEGATIVE

## 2024-04-11 LAB — PROTIME-INR
INR: 1.5 — ABNORMAL HIGH (ref 0.8–1.2)
Prothrombin Time: 18.5 s — ABNORMAL HIGH (ref 11.4–15.2)

## 2024-04-11 LAB — MAGNESIUM: Magnesium: 1.7 mg/dL (ref 1.7–2.4)

## 2024-04-11 LAB — HEPARIN LEVEL (UNFRACTIONATED): Heparin Unfractionated: 0.23 [IU]/mL — ABNORMAL LOW (ref 0.30–0.70)

## 2024-04-11 MED ORDER — HEPARIN (PORCINE) 25000 UT/250ML-% IV SOLN
1400.0000 [IU]/h | INTRAVENOUS | Status: DC
  Administered 2024-04-11: 1200 [IU]/h via INTRAVENOUS
  Filled 2024-04-11: qty 250

## 2024-04-11 MED ORDER — HEPARIN BOLUS VIA INFUSION
2000.0000 [IU] | Freq: Once | INTRAVENOUS | Status: AC
  Administered 2024-04-11: 2000 [IU] via INTRAVENOUS
  Filled 2024-04-11: qty 2000
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
## 2024-04-11 NOTE — Progress Notes (Signed)
 Mobility Specialist - Progress Note   04/11/24 1033  Mobility  Activity Ambulated with assistance in hallway  Level of Assistance Modified independent, requires aide device or extra time  Assistive Device Front wheel walker  Distance Ambulated (ft) 500 ft  Activity Response Tolerated well  Mobility Referral Yes  Mobility visit 1 Mobility  Mobility Specialist Start Time (ACUTE ONLY) 1007  Mobility Specialist Stop Time (ACUTE ONLY) 1032  Mobility Specialist Time Calculation (min) (ACUTE ONLY) 25 min   Pt received in bed and agreeable to mobility. No complaints during session. Pt to recliner after session with all needs met.    [facility]

## 2024-04-11 NOTE — Progress Notes (Signed)
 PROGRESS NOTE    Alexander [name]  FMW:[id number] DOB: [birth year] DOA: 04/09/2024 PCP: [name], PA   Brief Narrative:   63 y.o. 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  GI was also consulted for possible colonoscopy.  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  General surgery following and planning for possible surgical intervention on Monday.   -GI following as well for possible colonoscopy tomorrow  History of PE/DVT on Coumadin  Supratherapeutic INR - Coumadin  on hold.  INR 1.5 today.  Received a dose of oral vitamin K  on 04/10/2024.  Monitor INR. Start heparin  today  Anemia of chronic disease - From chronic illnesses.  Hemoglobin stable.  Monitor intermittently  Acute metabolic acidosis - Resolved  Essential hypertension Hyperlipidemia Aortic atherosclerosis -Monitor.  Blood pressure currently stable.  Not on any medications for hyperlipidemia at this time.  Outpatient follow-up with PCP  Gout - Asymptomatic  Sleep disturbance - Continue trazodone  at bedtime  Pulmonary hypertension-grade 1 diastolic dysfunction - Outpatient follow-up  DVT prophylaxis: Coumadin  on hold. Code Status: Full Family Communication: None at bedside Disposition Plan: Status is:  Inpatient Remains inpatient appropriate because: Of severity of illness  Consultants: General Surgery/GI  Procedures: None  Antimicrobials: None   Subjective: Patient seen and examined at bedside.  No chest pain, shortness breath, fever reported.  Complains of intermittent abdominal pain Objective: Vitals:   04/10/24 0649 04/10/24 1151 04/10/24 2052 04/11/24 0451  BP: 132/71 111/68 116/75 126/73  Pulse: 75 98 75 87  Resp: 18  18 18   Temp: 97.8 F (36.6 C) 98.3 F (36.8 C) 97.8 F (36.6 C) 98.5 F (36.9 C)  TempSrc: Oral  Oral Oral  SpO2: 100% 100% 100% 98%  Weight:    72.8 kg  Height:        Intake/Output Summary (Last 24 hours) at 04/11/2024 0815 Last data filed at 04/10/2024 2230 Gross per 24 hour  Intake 120 ml  Output 860 ml  Net -740 ml   Filed Weights   04/09/24 1536 04/11/24 0451  Weight: 70.8 kg 72.8 kg    Examination:  General: On room air.  No distress ENT/neck: No thyromegaly.  JVD is not elevated  respiratory: Decreased breath sounds at bases bilaterally with some crackles; no wheezing  CVS: S1-S2 heard, rate controlled currently Abdominal: Soft, mildly tender, slightly distended; no organomegaly, bowel sounds are heard.  Midline wound with exposed small bowel present Extremities: Trace lower extremity edema; no cyanosis  CNS: Awake and alert.  No focal neurologic deficit.  Moves extremities Lymph: No obvious lymphadenopathy Skin: No obvious ecchymosis/lesions  psych: Mostly flat.  Not agitated. musculoskeletal: No obvious joint swelling/deformity      Data Reviewed: I have personally reviewed following labs and imaging studies  CBC: Recent Labs  Lab 04/09/24 1542 04/11/24 0652  WBC 5.5 5.0  NEUTROABS  --  2.7  HGB 9.6* 9.8*  HCT 30.8* 31.2*  MCV 98.7 96.9  PLT 262  260   Basic Metabolic Panel: Recent Labs  Lab 04/09/24 1542 04/09/24 1608 04/10/24 0518 04/11/24 0652  NA  --  139 137 137  K  --  3.9 3.7 4.0  CL  --  113* 111 111   CO2  --  18* 21* 22  GLUCOSE  --  108* 96 102*  BUN  --  14 12 13   CREATININE  --  0.81 0.78 0.83  CALCIUM   --  9.1 9.2 9.5  MG 1.7  --   --  1.7  PHOS 3.5  --   --   --    GFR: Estimated Creatinine Clearance: 92.3 mL/min (by C-G formula based on SCr of 0.83 mg/dL). Liver Function Tests: Recent Labs  Lab 04/09/24 1608 04/10/24 0518  AST 15 14*  ALT 12 11  ALKPHOS 51 54  BILITOT 0.5 0.7  PROT 7.3 6.9  ALBUMIN  3.3* 3.1*   No results for input(s): LIPASE, AMYLASE in the last 168 hours. No results for input(s): AMMONIA in the last 168 hours. Coagulation Profile: Recent Labs  Lab 04/09/24 1542 04/10/24 0518 04/11/24 0652  INR 3.1* 3.2* 1.5*   Cardiac Enzymes: No results for input(s): CKTOTAL, CKMB, CKMBINDEX, TROPONINI in the last 168 hours. BNP (last 3 results) No results for input(s): PROBNP in the last 8760 hours. HbA1C: No results for input(s): HGBA1C in the last 72 hours. CBG: No results for input(s): GLUCAP in the last 168 hours. Lipid Profile: No results for input(s): CHOL, HDL, LDLCALC, TRIG, CHOLHDL, LDLDIRECT in the last 72 hours. Thyroid Function Tests: No results for input(s): TSH, T4TOTAL, FREET4, T3FREE, THYROIDAB in the last 72 hours. Anemia Panel: No results for input(s): VITAMINB12, FOLATE, FERRITIN, TIBC, IRON , RETICCTPCT in the last 72 hours. Sepsis Labs: No results for input(s): PROCALCITON, LATICACIDVEN in the last 168 hours.  Recent Results (from the past 240 hours)  MRSA Next Gen by PCR, Nasal     Status: Abnormal   Collection Time: 04/09/24  4:19 PM   Specimen: Nasal Mucosa; Nasal Swab  Result Value Ref Range Status   MRSA by PCR Next Gen DETECTED (A) NOT DETECTED Final    Comment: (NOTE) The GeneXpert MRSA Assay (FDA approved for NASAL specimens only), is one component of a comprehensive MRSA colonization surveillance program. It is not intended to diagnose MRSA infection nor to  guide or monitor treatment for MRSA infections. Test performance is not FDA approved in patients less than 2 years old. Performed at [facility], [address], [place], [place] [address]          Radiology Studies: CT ABDOMEN PELVIS W CONTRAST Result Date: 04/09/2024 CLINICAL DATA:  Acute, non localized abdominal pain. History of right colectomy and end ileostomy. IR injection of a drain in the abdomen on 03/24/2024 demonstrated communication of the area of the drain with a small bowel loop compatible with a small bowel fistula. EXAM: CT ABDOMEN AND PELVIS WITH CONTRAST TECHNIQUE: Multidetector CT imaging of the abdomen and pelvis was performed using the standard protocol following bolus administration of intravenous contrast. RADIATION DOSE REDUCTION: This exam was performed according to the departmental dose-optimization program which includes automated exposure control, adjustment of the mA and/or kV according to patient size and/or use of iterative reconstruction technique. CONTRAST:  OMNIPAQUE  IOHEXOL  300 MG/ML  SOLN COMPARISON:  04/03/2024 and IR drain injection dated 03/24/2024. FINDINGS: Lower chest: Heart size near the upper limit of normal. Small amount of residual atelectasis in the left lower  lobe. Hepatobiliary: No focal liver abnormality is seen. No gallstones, gallbladder wall thickening, or biliary dilatation. Pancreas: Unremarkable. No pancreatic ductal dilatation or surrounding inflammatory changes. Spleen: Normal in size without focal abnormality. Adrenals/Urinary Tract: Normal-appearing adrenal glands. Mild bilateral renal cortical scarring. Unremarkable ureters. Poorly distended urinary bladder with moderate diffuse wall thickening. Stomach/Bowel: Stomach distended with ingested material. Normal appearing small bowel with an ileostomy in place with normal passage of contrast through the small bowel and into the ostomy bag. Status post right and proximal  transverse colon colectomy with an oversewn staple line at the proximal end of the distal transverse colon. Multiple descending and sigmoid colon diverticula without evidence of diverticulitis. Vascular/Lymphatic: Atheromatous arterial calcifications without aneurysm. No enlarged lymph nodes. Reproductive: Prostate is unremarkable. Other: Open midline abdominal wound. Small bilateral inguinal hernias. No intra-abdominal fluid collections or extravasated contrast. 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 Lumbar and lower thoracic spine degenerative changes. IMPRESSION: 1. No acute abnormality. 2. Status post right and proximal transverse colon colectomy with an ileostomy in place. 3. No evidence of a persistent small-bowel fistula or leak on these images. 4. Descending and sigmoid colon diverticulosis. 5. Small bilateral inguinal hernias. 6. 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 Electronically Signed   By: Elspeth Bathe M.D.   On: 04/09/2024 19:56        Scheduled Meds:  feeding supplement  1 Container Oral TID BM   gabapentin   100 mg Oral QHS   pantoprazole   40 mg Oral BID   traZODone   100 mg Oral QHS   Continuous Infusions:  [START ON 04/13/2024] cefoTEtan  (CEFOTAN ) IV            Sophie Mao, MD [facility] 04/11/2024, 8:15 AM '

## 2024-04-11 NOTE — Plan of Care (Signed)

## 2024-04-11 NOTE — Plan of Care (Signed)
  Problem: Education: Goal: Knowledge of General Education information will improve Description: Including pain rating scale, medication(s)/side effects and non-pharmacologic comfort measures Outcome: Progressing   Problem: Clinical Measurements: Goal: Ability to maintain clinical measurements within normal limits will improve Outcome: Progressing   Problem: Coping: Goal: Level of anxiety will decrease Outcome: Progressing   
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
## 2024-04-11 NOTE — Progress Notes (Signed)
 Gastroenterology Inpatient Follow-up Note   PATIENT IDENTIFICATION  Alexander [name] is a 63 y.o. male Hospital Day: 3  SUBJECTIVE  The patient's chart has been reviewed. The patient's labs have been reviewed.  His blood counts are stable and his INR is 1.5. Today, the patient is doing well. He continues to have his persisting abdominal pain and discomfort but no worse than yesterday and tolerable with pain medication therapy. He is wondering if he can have more than his current diet. The patient denies fevers or chills.   OBJECTIVE   Scheduled Inpatient Medications:   feeding supplement  1 Container Oral TID BM   gabapentin   100 mg Oral QHS   pantoprazole   40 mg Oral BID   traZODone   100 mg Oral QHS   Continuous Inpatient Infusions:   [START ON 04/13/2024] cefoTEtan  (CEFOTAN ) IV     PRN Inpatient Medications: acetaminophen  **OR** acetaminophen , alum & mag hydroxide-simeth, methocarbamol , morphine  injection, ondansetron  **OR** ondansetron  (ZOFRAN ) IV, oxyCODONE    Physical Examination   Temp:  [97.8 F (36.6 C)-98.5 F (36.9 C)] 98.5 F (36.9 C) (07/12 0451) Pulse Rate:  [75-98] 87 (07/12 0451) Resp:  [18] 18 (07/12 0451) BP: (111-132)/(68-75) 126/73 (07/12 0451) SpO2:  [98 %-100 %] 98 % (07/12 0451) Weight:  [72.8 kg] 72.8 kg (07/12 0451) Temp (24hrs), Avg:98.1 F (36.7 C), Min:97.8 F (36.6 C), Max:98.5 F (36.9 C)  Weight: 72.8 kg GEN: NAD, appears stated age, doesn't appear chronically ill, resting in bed PSYCH: Cooperative, without pressured speech EYE: Conjunctivae pink, sclerae anicteric ENT: MMM CV: Nontachycardic RESP: No audible wheezing GI: Ileostomy site in place, bandaged midline, surgical scars present MSK/EXT: No significant lower extremity edema SKIN: No jaundice NEURO:  Alert & Oriented x 3, no focal deficits   Review of Data   Laboratory Studies   Recent Labs  Lab 04/09/24 1542 04/09/24 1608 04/10/24 0518  NA  --    < > 137  K  --     < > 3.7  CL  --    < > 111  CO2  --    < > 21*  BUN  --    < > 12  CREATININE  --    < > 0.78  GLUCOSE  --    < > 96  CALCIUM   --    < > 9.2  MG 1.7  --   --   PHOS 3.5  --   --    < > = values in this interval not displayed.   Recent Labs  Lab 04/10/24 0518  AST 14*  ALT 11  ALKPHOS 54    Recent Labs  Lab 04/09/24 1542  WBC 5.5  HGB 9.6*  HCT 30.8*  PLT 262   Recent Labs  Lab 04/09/24 1542 04/10/24 0518  INR 3.1* 3.2*   Imaging Studies   CT ABDOMEN PELVIS W CONTRAST Result Date: 04/09/2024 CLINICAL DATA:  Acute, non localized abdominal pain. History of right colectomy and end ileostomy. IR injection of a drain in the abdomen on 03/24/2024 demonstrated communication of the area of the drain with a small bowel loop compatible with a small bowel fistula. EXAM: CT ABDOMEN AND PELVIS WITH CONTRAST TECHNIQUE: Multidetector CT imaging of the abdomen and pelvis was performed using the standard protocol following bolus administration of intravenous contrast. RADIATION DOSE REDUCTION: This exam was performed according to the departmental dose-optimization program which includes automated exposure control, adjustment of the mA and/or kV according to patient size and/or  use of iterative reconstruction technique. CONTRAST:  OMNIPAQUE  IOHEXOL  300 MG/ML  SOLN COMPARISON:  04/03/2024 and IR drain injection dated 03/24/2024. FINDINGS: Lower chest: Heart size near the upper limit of normal. Small amount of residual atelectasis in the left lower lobe. Hepatobiliary: No focal liver abnormality is seen. No gallstones, gallbladder wall thickening, or biliary dilatation. Pancreas: Unremarkable. No pancreatic ductal dilatation or surrounding inflammatory changes. Spleen: Normal in size without focal abnormality. Adrenals/Urinary Tract: Normal-appearing adrenal glands. Mild bilateral renal cortical scarring. Unremarkable ureters. Poorly distended urinary bladder with moderate diffuse wall  thickening. Stomach/Bowel: Stomach distended with ingested material. Normal appearing small bowel with an ileostomy in place with normal passage of contrast through the small bowel and into the ostomy bag. Status post right and proximal transverse colon colectomy with an oversewn staple line at the proximal end of the distal transverse colon. Multiple descending and sigmoid colon diverticula without evidence of diverticulitis. Vascular/Lymphatic: Atheromatous arterial calcifications without aneurysm. No enlarged lymph nodes. Reproductive: Prostate is unremarkable. Other: Open midline abdominal wound. Small bilateral inguinal hernias. No intra-abdominal fluid collections or extravasated contrast. 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 Lumbar and lower thoracic spine degenerative changes. IMPRESSION: 1. No acute abnormality. 2. Status post right and proximal transverse colon colectomy with an ileostomy in place. 3. No evidence of a persistent small-bowel fistula or leak on these images. 4. Descending and sigmoid colon diverticulosis. 5. Small bilateral inguinal hernias. 6. 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 Electronically Signed   By: Elspeth Bathe M.D.   On: 04/09/2024 19:56   GI Procedures and Studies  No relevant studies to review   ASSESSMENT  Alexander [name] is a 63 y.o. male with a pmh significant for pulmonary hypertension, PE/VTE (on Coumadin ), hypertension, CHF, GERD, complicated diverticulitis requiring ileostomy and right hemicolectomy.  Patient admitted for consideration of colonoscopy prior to ileostomy to be reversal with small bowel abdominal wound dehiscence.  The patient is hemodynamically and clinically stable at this time.  His INR is appropriate for attempted colonoscopy tomorrow.  He is in discontinuity, so we will do 2 tapwater enemas in effort of clearing out his left colon to  better define his anatomy and remove polyps if able/seen.  We will not enter his ileostomy site.  I have no contraindication for patient being on a full liquid diet, though not clear what he will need if any bowel prep from a surgical standpoint before surgery on Monday and will allow medicine and surgery to discuss that further.  Since anticoagulation is to be started, I asked that this be held for at least 4 to 6 hours tomorrow morning as outlined below.  The risks and benefits of endoscopic evaluation were discussed with the patient; these include but are not limited to the risk of perforation, infection, bleeding, missed lesions, lack of diagnosis, severe illness requiring hospitalization, as well as anesthesia and sedation related illnesses.  The patient and/or family is agreeable to proceed.    PLAN/RECOMMENDATIONS  Diet plan as per surgery/medicine for today N.p.o. at midnight Colonoscopy on 7/13 - Bowel preparation will be 2 tapwater enemas to be done early on 7/13 AM - Hold heparin  drip at 0300 on 7/13   Please page/call with questions or concerns.   Aloha Finner, MD [facility] Office # [phone]    LOS: 2 days  Aloha Finner Raddle  04/11/2024, 5:45 AM

## 2024-04-11 NOTE — H&P (View-Only) (Signed)
 Gastroenterology Inpatient Follow-up Note   PATIENT IDENTIFICATION  Alexander [name] is a 63 y.o. male Hospital Day: 3  SUBJECTIVE  The patient's chart has been reviewed. The patient's labs have been reviewed.  His blood counts are stable and his INR is 1.5. Today, the patient is doing well. He continues to have his persisting abdominal pain and discomfort but no worse than yesterday and tolerable with pain medication therapy. He is wondering if he can have more than his current diet. The patient denies fevers or chills.   OBJECTIVE   Scheduled Inpatient Medications:   feeding supplement  1 Container Oral TID BM   gabapentin   100 mg Oral QHS   pantoprazole   40 mg Oral BID   traZODone   100 mg Oral QHS   Continuous Inpatient Infusions:   [START ON 04/13/2024] cefoTEtan  (CEFOTAN ) IV     PRN Inpatient Medications: acetaminophen  **OR** acetaminophen , alum & mag hydroxide-simeth, methocarbamol , morphine  injection, ondansetron  **OR** ondansetron  (ZOFRAN ) IV, oxyCODONE    Physical Examination   Temp:  [97.8 F (36.6 C)-98.5 F (36.9 C)] 98.5 F (36.9 C) (07/12 0451) Pulse Rate:  [75-98] 87 (07/12 0451) Resp:  [18] 18 (07/12 0451) BP: (111-132)/(68-75) 126/73 (07/12 0451) SpO2:  [98 %-100 %] 98 % (07/12 0451) Weight:  [72.8 kg] 72.8 kg (07/12 0451) Temp (24hrs), Avg:98.1 F (36.7 C), Min:97.8 F (36.6 C), Max:98.5 F (36.9 C)  Weight: 72.8 kg GEN: NAD, appears stated age, doesn't appear chronically ill, resting in bed PSYCH: Cooperative, without pressured speech EYE: Conjunctivae pink, sclerae anicteric ENT: MMM CV: Nontachycardic RESP: No audible wheezing GI: Ileostomy site in place, bandaged midline, surgical scars present MSK/EXT: No significant lower extremity edema SKIN: No jaundice NEURO:  Alert & Oriented x 3, no focal deficits   Review of Data   Laboratory Studies   Recent Labs  Lab 04/09/24 1542 04/09/24 1608 04/10/24 0518  NA  --    < > 137  K  --     < > 3.7  CL  --    < > 111  CO2  --    < > 21*  BUN  --    < > 12  CREATININE  --    < > 0.78  GLUCOSE  --    < > 96  CALCIUM   --    < > 9.2  MG 1.7  --   --   PHOS 3.5  --   --    < > = values in this interval not displayed.   Recent Labs  Lab 04/10/24 0518  AST 14*  ALT 11  ALKPHOS 54    Recent Labs  Lab 04/09/24 1542  WBC 5.5  HGB 9.6*  HCT 30.8*  PLT 262   Recent Labs  Lab 04/09/24 1542 04/10/24 0518  INR 3.1* 3.2*   Imaging Studies   CT ABDOMEN PELVIS W CONTRAST Result Date: 04/09/2024 CLINICAL DATA:  Acute, non localized abdominal pain. History of right colectomy and end ileostomy. IR injection of a drain in the abdomen on 03/24/2024 demonstrated communication of the area of the drain with a small bowel loop compatible with a small bowel fistula. EXAM: CT ABDOMEN AND PELVIS WITH CONTRAST TECHNIQUE: Multidetector CT imaging of the abdomen and pelvis was performed using the standard protocol following bolus administration of intravenous contrast. RADIATION DOSE REDUCTION: This exam was performed according to the departmental dose-optimization program which includes automated exposure control, adjustment of the mA and/or kV according to patient size and/or  use of iterative reconstruction technique. CONTRAST:  OMNIPAQUE  IOHEXOL  300 MG/ML  SOLN COMPARISON:  04/03/2024 and IR drain injection dated 03/24/2024. FINDINGS: Lower chest: Heart size near the upper limit of normal. Small amount of residual atelectasis in the left lower lobe. Hepatobiliary: No focal liver abnormality is seen. No gallstones, gallbladder wall thickening, or biliary dilatation. Pancreas: Unremarkable. No pancreatic ductal dilatation or surrounding inflammatory changes. Spleen: Normal in size without focal abnormality. Adrenals/Urinary Tract: Normal-appearing adrenal glands. Mild bilateral renal cortical scarring. Unremarkable ureters. Poorly distended urinary bladder with moderate diffuse wall  thickening. Stomach/Bowel: Stomach distended with ingested material. Normal appearing small bowel with an ileostomy in place with normal passage of contrast through the small bowel and into the ostomy bag. Status post right and proximal transverse colon colectomy with an oversewn staple line at the proximal end of the distal transverse colon. Multiple descending and sigmoid colon diverticula without evidence of diverticulitis. Vascular/Lymphatic: Atheromatous arterial calcifications without aneurysm. No enlarged lymph nodes. Reproductive: Prostate is unremarkable. Other: Open midline abdominal wound. Small bilateral inguinal hernias. No intra-abdominal fluid collections or extravasated contrast. 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 Lumbar and lower thoracic spine degenerative changes. IMPRESSION: 1. No acute abnormality. 2. Status post right and proximal transverse colon colectomy with an ileostomy in place. 3. No evidence of a persistent small-bowel fistula or leak on these images. 4. Descending and sigmoid colon diverticulosis. 5. Small bilateral inguinal hernias. 6. 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 Electronically Signed   By: Elspeth Bathe M.D.   On: 04/09/2024 19:56   GI Procedures and Studies  No relevant studies to review   ASSESSMENT  [name] is a 63 y.o. male with a pmh significant for pulmonary hypertension, PE/VTE (on Coumadin ), hypertension, CHF, GERD, complicated diverticulitis requiring ileostomy and right hemicolectomy.  Patient admitted for consideration of colonoscopy prior to ileostomy to be reversal with small bowel abdominal wound dehiscence.  The patient is hemodynamically and clinically stable at this time.  His INR is appropriate for attempted colonoscopy tomorrow.  He is in discontinuity, so we will do 2 tapwater enemas in effort of clearing out his left colon to  better define his anatomy and remove polyps if able/seen.  We will not enter his ileostomy site.  I have no contraindication for patient being on a full liquid diet, though not clear what he will need if any bowel prep from a surgical standpoint before surgery on Monday and will allow medicine and surgery to discuss that further.  Since anticoagulation is to be started, I asked that this be held for at least 4 to 6 hours tomorrow morning as outlined below.  The risks and benefits of endoscopic evaluation were discussed with the patient; these include but are not limited to the risk of perforation, infection, bleeding, missed lesions, lack of diagnosis, severe illness requiring hospitalization, as well as anesthesia and sedation related illnesses.  The patient and/or family is agreeable to proceed.    PLAN/RECOMMENDATIONS  Diet plan as per surgery/medicine for today N.p.o. at midnight Colonoscopy on 7/13 - Bowel preparation will be 2 tapwater enemas to be done early on 7/13 AM - Hold heparin  drip at 0300 on 7/13   Please page/call with questions or concerns.   Aloha Finner, MD [facility] Office # [phone]    LOS: 2 days  Aloha Finner Raddle  04/11/2024, 5:45 AM

## 2024-04-11 NOTE — Progress Notes (Signed)
 Subjective/Chief Complaint: No complaints   Objective: Vital signs in last 24 hours: Temp:  [97.8 F (36.6 C)-98.5 F (36.9 C)] 98.5 F (36.9 C) (07/12 0451) Pulse Rate:  [75-98] 87 (07/12 0451) Resp:  [18] 18 (07/12 0451) BP: (111-126)/(68-75) 126/73 (07/12 0451) SpO2:  [98 %-100 %] 98 % (07/12 0451) Weight:  [72.8 kg] 72.8 kg (07/12 0451) Last BM Date : 04/10/24  Intake/Output from previous day: 07/11 0701 - 07/12 0700 In: 360 [P.O.:360] Out: 860 [Stool:860] Intake/Output this shift: No intake/output data recorded.  Ileostomy productive Wound dressed  Lab Results:  Recent Labs    04/09/24 1542 04/11/24 0652  WBC 5.5 5.0  HGB 9.6* 9.8*  HCT 30.8* 31.2*  PLT 262 260   BMET Recent Labs    04/10/24 0518 04/11/24 0652  NA 137 137  K 3.7 4.0  CL 111 111  CO2 21* 22  GLUCOSE 96 102*  BUN 12 13  CREATININE 0.78 0.83  CALCIUM  9.2 9.5   PT/INR Recent Labs    04/10/24 0518 04/11/24 0652  LABPROT 33.8* 18.5*  INR 3.2* 1.5*   ABG No results for input(s): PHART, HCO3 in the last 72 hours.  Invalid input(s): PCO2, PO2  Studies/Results: CT ABDOMEN PELVIS W CONTRAST Result Date: 04/09/2024 CLINICAL DATA:  Acute, non localized abdominal pain. History of right colectomy and end ileostomy. IR injection of a drain in the abdomen on 03/24/2024 demonstrated communication of the area of the drain with a small bowel loop compatible with a small bowel fistula. EXAM: CT ABDOMEN AND PELVIS WITH CONTRAST TECHNIQUE: Multidetector CT imaging of the abdomen and pelvis was performed using the standard protocol following bolus administration of intravenous contrast. RADIATION DOSE REDUCTION: This exam was performed according to the departmental dose-optimization program which includes automated exposure control, adjustment of the mA and/or kV according to patient size and/or use of iterative reconstruction technique. CONTRAST:  OMNIPAQUE  IOHEXOL  300 MG/ML  SOLN  COMPARISON:  04/03/2024 and IR drain injection dated 03/24/2024. FINDINGS: Lower chest: Heart size near the upper limit of normal. Small amount of residual atelectasis in the left lower lobe. Hepatobiliary: No focal liver abnormality is seen. No gallstones, gallbladder wall thickening, or biliary dilatation. Pancreas: Unremarkable. No pancreatic ductal dilatation or surrounding inflammatory changes. Spleen: Normal in size without focal abnormality. Adrenals/Urinary Tract: Normal-appearing adrenal glands. Mild bilateral renal cortical scarring. Unremarkable ureters. Poorly distended urinary bladder with moderate diffuse wall thickening. Stomach/Bowel: Stomach distended with ingested material. Normal appearing small bowel with an ileostomy in place with normal passage of contrast through the small bowel and into the ostomy bag. Status post right and proximal transverse colon colectomy with an oversewn staple line at the proximal end of the distal transverse colon. Multiple descending and sigmoid colon diverticula without evidence of diverticulitis. Vascular/Lymphatic: Atheromatous arterial calcifications without aneurysm. No enlarged lymph nodes. Reproductive: Prostate is unremarkable. Other: Open midline abdominal wound. Small bilateral inguinal hernias. No intra-abdominal fluid collections or extravasated contrast. 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 Lumbar and lower thoracic spine degenerative changes. IMPRESSION: 1. No acute abnormality. 2. Status post right and proximal transverse colon colectomy with an ileostomy in place. 3. No evidence of a persistent small-bowel fistula or leak on these images. 4. Descending and sigmoid colon diverticulosis. 5. Small bilateral inguinal hernias. 6. 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 Electronically Signed   By: Elspeth Robynn HERO.D.  On: 04/09/2024 19:56     Anti-infectives: Anti-infectives (From admission, onward)    Start     Dose/Rate Route Frequency Ordered Stop   04/13/24 0900  cefoTEtan  (CEFOTAN ) 2 g in sodium chloride  0.9 % 100 mL IVPB        2 g 200 mL/hr over 30 Minutes Intravenous On call to O.R. 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  Alexander [name] 04/11/2024
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
## 2024-04-12 ENCOUNTER — Inpatient Hospital Stay (HOSPITAL_COMMUNITY): Admitting: Anesthesiology

## 2024-04-12 ENCOUNTER — Encounter (HOSPITAL_COMMUNITY): Payer: Self-pay | Admitting: Gastroenterology

## 2024-04-12 ENCOUNTER — Encounter (HOSPITAL_COMMUNITY)
Admission: AD | Disposition: A | Discharge status: Nursing Home (Includes ICF) | Payer: Self-pay | Source: Ambulatory Visit | Attending: General Surgery

## 2024-04-12 DIAGNOSIS — K641 Second degree hemorrhoids: Secondary | ICD-10-CM

## 2024-04-12 DIAGNOSIS — K529 Noninfective gastroenteritis and colitis, unspecified: Secondary | ICD-10-CM | POA: Diagnosis not present

## 2024-04-12 DIAGNOSIS — Z1211 Encounter for screening for malignant neoplasm of colon: Secondary | ICD-10-CM

## 2024-04-12 DIAGNOSIS — I700 Atherosclerosis of aorta: Secondary | ICD-10-CM

## 2024-04-12 DIAGNOSIS — K6389 Other specified diseases of intestine: Secondary | ICD-10-CM | POA: Diagnosis not present

## 2024-04-12 DIAGNOSIS — K6289 Other specified diseases of anus and rectum: Secondary | ICD-10-CM

## 2024-04-12 DIAGNOSIS — I10 Essential (primary) hypertension: Secondary | ICD-10-CM

## 2024-04-12 DIAGNOSIS — K632 Fistula of intestine: Secondary | ICD-10-CM

## 2024-04-12 DIAGNOSIS — T81321D Disruption or dehiscence of closure of internal operation (surgical) wound of abdominal wall muscle or fascia, subsequent encounter: Secondary | ICD-10-CM | POA: Diagnosis not present

## 2024-04-12 HISTORY — PX: COLONOSCOPY: SHX5424

## 2024-04-12 HISTORY — PX: BONE BIOPSY: SHX375

## 2024-04-12 LAB — PROTIME-INR
INR: 1.2 (ref 0.8–1.2)
Prothrombin Time: 16 s — ABNORMAL HIGH (ref 11.4–15.2)

## 2024-04-12 LAB — CBC
HCT: 30 % — ABNORMAL LOW (ref 39.0–52.0)
Hemoglobin: 9.4 g/dL — ABNORMAL LOW (ref 13.0–17.0)
MCH: 30.6 pg (ref 26.0–34.0)
MCHC: 31.3 g/dL (ref 30.0–36.0)
MCV: 97.7 fL (ref 80.0–100.0)
Platelets: 235 K/uL (ref 150–400)
RBC: 3.07 MIL/uL — ABNORMAL LOW (ref 4.22–5.81)
RDW: 17.2 % — ABNORMAL HIGH (ref 11.5–15.5)
WBC: 5.2 K/uL (ref 4.0–10.5)
nRBC: 0 % (ref 0.0–0.2)

## 2024-04-12 LAB — HEPARIN LEVEL (UNFRACTIONATED): Heparin Unfractionated: 0.61 [IU]/mL (ref 0.30–0.70)

## 2024-04-12 SURGERY — COLONOSCOPY
Anesthesia: Monitor Anesthesia Care

## 2024-04-12 MED ORDER — HEPARIN (PORCINE) 25000 UT/250ML-% IV SOLN
1400.0000 [IU]/h | INTRAVENOUS | Status: DC
  Administered 2024-04-12: 1400 [IU]/h via INTRAVENOUS
  Filled 2024-04-12: qty 250

## 2024-04-12 MED ORDER — PROPOFOL 500 MG/50ML IV EMUL
INTRAVENOUS | Status: DC | PRN
  Administered 2024-04-12: 180 ug/kg/min via INTRAVENOUS

## 2024-04-12 MED ORDER — SODIUM CHLORIDE 0.9 % IV SOLN: INTRAVENOUS | Status: DC | PRN

## 2024-04-12 MED ORDER — LIDOCAINE 2% (20 MG/ML) 5 ML SYRINGE
INTRAMUSCULAR | Status: DC | PRN
  Administered 2024-04-12: 40 mg via INTRAVENOUS

## 2024-04-12 MED ORDER — ACETAMINOPHEN 500 MG PO TABS
1000.0000 mg | ORAL_TABLET | ORAL | Status: AC
  Administered 2024-04-13: 1000 mg via ORAL
  Filled 2024-04-12: qty 2

## 2024-04-12 MED ORDER — PROPOFOL 10 MG/ML IV BOLUS
INTRAVENOUS | Status: DC | PRN
  Administered 2024-04-12: 10 mg via INTRAVENOUS
  Administered 2024-04-12: 20 mg via INTRAVENOUS

## 2024-04-12 NOTE — Anesthesia Preprocedure Evaluation (Addendum)
 Anesthesia Evaluation  Patient identified by MRN, date of birth, ID band Patient awake    Reviewed: Allergy & Precautions, NPO status , Patient's Chart, lab work & pertinent test results  History of Anesthesia Complications Negative for: history of anesthetic complications  Airway Mallampati: II  TM Distance: >3 FB Neck ROM: Full    Dental  (+) Dental Advisory Given   Pulmonary former smoker, PE (2019, 2020)   breath sounds clear to auscultation       Cardiovascular hypertension (no medications), pulmonary hypertension+ Peripheral Vascular Disease and + DVT   Rhythm:Regular Rate:Normal  07/2023 ECHO: EF 50 to 55%.  1. The LV has low normal function, no regional wall motion abnormalities. Grade I diastolic dysfunction (impaired relaxation). There is the interventricular septum is  flattened in diastole ('D' shaped left ventricle), consistent with right ventricular volume overload.   2. RVF is mildly reduced. The right ventricular size is normal. There is severely elevated pulmonary artery systolic pressure.   3. The mitral valve is normal in structure. No evidence of mitral valve regurgitation. No evidence of mitral stenosis.   4. The aortic valve is tricuspid. Aortic valve regurgitation is trivial. No aortic stenosis is present.     Neuro/Psych negative neurological ROS     GI/Hepatic Neg liver ROS,GERD  Medicated and Controlled,,Colostomy s/p colonic perf 2024   Endo/Other  negative endocrine ROS    Renal/GU Renal InsufficiencyRenal disease     Musculoskeletal  (+) Arthritis ,    Abdominal   Peds  Hematology  (+) Blood dyscrasia (Hb 9.4, plt 235k), anemia Coumadin : last dose 04/10/2024   Anesthesia Other Findings   Reproductive/Obstetrics                              Anesthesia Physical Anesthesia Plan  ASA: 3  Anesthesia Plan: MAC   Post-op Pain Management: Minimal or no pain  anticipated   Induction:   PONV Risk Score and Plan: 1 and Treatment may vary due to age or medical condition  Airway Management Planned: Natural Airway and Simple Face Mask  Additional Equipment: None  Intra-op Plan:   Post-operative Plan:   Informed Consent: I have reviewed the patients History and Physical, chart, labs and discussed the procedure including the risks, benefits and alternatives for the proposed anesthesia with the patient or authorized representative who has indicated his/her understanding and acceptance.     Dental advisory given  Plan Discussed with: CRNA and Surgeon  Anesthesia Plan Comments:          Anesthesia Quick Evaluation

## 2024-04-12 NOTE — Interval H&P Note (Signed)
 History and Physical Interval Note:  04/12/2024 7:21 AM  [name]  has presented today for surgery, with the diagnosis of abdominal pain.  The various methods of treatment have been discussed with the patient and family. After consideration of risks, benefits and other options for treatment, the patient has consented to  Procedure(s): COLONOSCOPY (N/A) as a surgical intervention.  The patient's history has been reviewed, patient examined, no change in status, stable for surgery.  I have reviewed the patient's chart and labs.  Questions were answered to the patient's satisfaction.     Daenerys Buttram Mansouraty Jr

## 2024-04-12 NOTE — Progress Notes (Signed)
 PROGRESS NOTE    Alexander [name]  FMW:[id number] DOB: 1961/03/28 DOA: 04/09/2024 PCP: [name], PA   Brief Narrative:   63 y.o. 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  GI was also consulted for possible colonoscopy.  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  General surgery following and planning for possible surgical intervention on Monday.   -GI following: Planning for colonoscopy today  History of PE/DVT on Coumadin  Supratherapeutic INR - Coumadin  on hold.  INR 1.5 today.  Received a dose of oral vitamin K  on 04/10/2024.  Monitor INR.  Resume heparin  after colonoscopy  Anemia of chronic disease - From chronic illnesses.  Hemoglobin stable.  Monitor intermittently  Acute metabolic acidosis - Resolved  Essential hypertension Hyperlipidemia Aortic atherosclerosis -Monitor.  Blood pressure currently stable.  Not on any medications for hyperlipidemia at this time.  Outpatient follow-up with PCP  Gout - Asymptomatic  Sleep disturbance - Continue trazodone  at bedtime  Pulmonary hypertension-grade 1 diastolic dysfunction - Outpatient follow-up  DVT prophylaxis: Coumadin  on hold. Code Status: Full Family Communication: None at bedside Disposition Plan: Status is:  Inpatient Remains inpatient appropriate because: Of severity of illness  Consultants: General Surgery/GI  Procedures: None  Antimicrobials: None   Subjective: Patient seen and examined at bedside.  Still having intermittent abdominal pain.  Denies any fever, chest pain or shortness of breath. Objective: Vitals:   04/11/24 2020 04/12/24 0538 04/12/24 0826 04/12/24 0830  BP: 109/67 116/71 116/74 109/73  Pulse: 73 75 82 78  Resp: 18 18 15 15   Temp: 98.4 F (36.9 C) 98.5 F (36.9 C) (!) 97.3 F (36.3 C)   TempSrc: Oral Oral    SpO2: 100% 100% 100% 100%  Weight:  71.8 kg    Height:        Intake/Output Summary (Last 24 hours) at 04/12/2024 0839 Last data filed at 04/12/2024 0816 Gross per 24 hour  Intake 331.39 ml  Output 350 ml  Net -18.61 ml   Filed Weights   04/09/24 1536 04/11/24 0451 04/12/24 0538  Weight: 70.8 kg 72.8 kg 71.8 kg    Examination:  General: No acute distress.  Remains on room air.   ENT/neck: No JVD elevation or palpable neck masses noted  respiratory: Bilateral decreased breath sounds at bases with scattered crackles CVS: Rate mostly controlled; S1 and S2 are heard Abdominal: Soft, slightly tender and  distended; no organomegaly, bowel sounds are heard normally.  Midline wound with exposed small bowel present Extremities: No clubbing; mild lower extremity edema present  CNS: Alert and oriented.  No focal neurologic deficit.  Able to move extremities lymph: No obvious palpable lymphadenopathy Skin: No obvious ecchymosis/lesions  psych: Not agitated currently.  Flat affect mostly  musculoskeletal: No obvious joint tenderness/erythema    Data Reviewed: I have personally reviewed following labs and imaging studies  CBC: Recent Labs  Lab 04/09/24 1542 04/11/24 0652 04/12/24 0047  WBC 5.5 5.0 5.2  NEUTROABS  --  2.7  --   HGB 9.6* 9.8* 9.4*  HCT 30.8* 31.2* 30.0*  MCV 98.7 96.9 97.7  PLT 262 260 235   Basic Metabolic Panel: Recent Labs   Lab 04/09/24 1542 04/09/24 1608 04/10/24 0518 04/11/24 0652  NA  --  139 137 137  K  --  3.9 3.7 4.0  CL  --  113* 111 111  CO2  --  18* 21* 22  GLUCOSE  --  108* 96 102*  BUN  --  14 12 13   CREATININE  --  0.81 0.78 0.83  CALCIUM   --  9.1 9.2 9.5  MG 1.7  --   --  1.7  PHOS 3.5  --   --   --    GFR: Estimated Creatinine Clearance: 92.3 mL/min (by C-G formula based on SCr of 0.83 mg/dL). Liver Function Tests: Recent Labs  Lab 04/09/24 1608 04/10/24 0518  AST 15 14*  ALT 12 11  ALKPHOS 51 54  BILITOT 0.5 0.7  PROT 7.3 6.9  ALBUMIN  3.3* 3.1*   No results for input(s): LIPASE, AMYLASE in the last 168 hours. No results for input(s): AMMONIA in the last 168 hours. Coagulation Profile: Recent Labs  Lab 04/09/24 1542 04/10/24 0518 04/11/24 0652 04/12/24 0047  INR 3.1* 3.2* 1.5* 1.2   Cardiac Enzymes: No results for input(s): CKTOTAL, CKMB, CKMBINDEX, TROPONINI in the last 168 hours. BNP (last 3 results) No results for input(s): PROBNP in the last 8760 hours. HbA1C: No results for input(s): HGBA1C in the last 72 hours. CBG: No results for input(s): GLUCAP in the last 168 hours. Lipid Profile: No results for input(s): CHOL, HDL, LDLCALC, TRIG, CHOLHDL, LDLDIRECT in the last 72 hours. Thyroid Function Tests: No results for input(s): TSH, T4TOTAL, FREET4, T3FREE, THYROIDAB in the last 72 hours. Anemia Panel: No results for input(s): VITAMINB12, FOLATE, FERRITIN, TIBC, IRON , RETICCTPCT in the last 72 hours. Sepsis Labs: No results for input(s): PROCALCITON, LATICACIDVEN in the last 168 hours.  Recent Results (from the past 240 hours)  MRSA Next Gen by PCR, Nasal     Status: Abnormal   Collection Time: 04/09/24  4:19 PM   Specimen: Nasal Mucosa; Nasal Swab  Result Value Ref Range Status   MRSA by PCR Next Gen DETECTED (A) NOT DETECTED Final    Comment: (NOTE) The GeneXpert MRSA Assay (FDA approved for  NASAL specimens only), is one component of a comprehensive MRSA colonization surveillance program. It is not intended to diagnose MRSA infection nor to guide or monitor treatment for MRSA infections. Test performance is not FDA approved in patients less than 66 years old. Performed at [facility], [address], [place], [place] [address]          Radiology Studies: No results found.       Scheduled Meds:  [MAR Hold] feeding supplement  1 Container Oral TID BM   [MAR Hold] gabapentin   100 mg Oral QHS   [MAR Hold] pantoprazole   40 mg Oral BID   [MAR Hold] traZODone   100 mg Oral QHS   Continuous Infusions:  [MAR Hold] cefoTEtan  (CEFOTAN ) IV            Sophie Mao, MD [facility] 04/12/2024, 8:39 AM '
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
## 2024-04-12 NOTE — Progress Notes (Signed)
*   Day of Surgery *   Subjective/Chief Complaint: S/p csc discussed with Dr Wilhelmenia, doing fine, hungry   Objective: Vital signs in last 24 hours: Temp:  [97.3 F (36.3 C)-98.6 F (37 C)] 97.8 F (36.6 C) (07/13 0843) Pulse Rate:  [68-93] 68 (07/13 0843) Resp:  [12-18] 12 (07/13 0843) BP: (109-118)/(67-74) 118/73 (07/13 0843) SpO2:  [100 %] 100 % (07/13 0843) Weight:  [71.8 kg] 71.8 kg (07/13 0538) Last BM Date : 04/10/24  Intake/Output from previous day: 07/12 0701 - 07/13 0700 In: 231.4 [I.V.:231.4] Out: 350 [Urine:200; Stool:150] Intake/Output this shift: Total I/O In: 100 [I.V.:100] Out: -   Ab soft  Lab Results:  Recent Labs    04/11/24 0652 04/12/24 0047  WBC 5.0 5.2  HGB 9.8* 9.4*  HCT 31.2* 30.0*  PLT 260 235   BMET Recent Labs    04/10/24 0518 04/11/24 0652  NA 137 137  K 3.7 4.0  CL 111 111  CO2 21* 22  GLUCOSE 96 102*  BUN 12 13  CREATININE 0.78 0.83  CALCIUM  9.2 9.5   PT/INR Recent Labs    04/11/24 0652 04/12/24 0047  LABPROT 18.5* 16.0*  INR 1.5* 1.2   ABG No results for input(s): PHART, HCO3 in the last 72 hours.  Invalid input(s): PCO2, PO2  Studies/Results: No results found.  Anti-infectives: Anti-infectives (From admission, onward)    Start     Dose/Rate Route Frequency Ordered Stop   04/13/24 0900  cefoTEtan  (CEFOTAN ) 2 g in sodium chloride  0.9 % 100 mL IVPB        2 g 200 mL/hr over 30 Minutes Intravenous On call to O.R. 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  Alexander [name] 04/12/2024

## 2024-04-12 NOTE — Anesthesia Postprocedure Evaluation (Signed)
 Anesthesia Post Note  Patient: Alexander [name]  Procedure(s) Performed: COLONOSCOPY BIOPSY, GI     Patient location during evaluation: PACU Anesthesia Type: MAC Level of consciousness: oriented, awake and alert and patient cooperative Pain management: pain level controlled Vital Signs Assessment: post-procedure vital signs reviewed and stable Respiratory status: spontaneous breathing, nonlabored ventilation and respiratory function stable Cardiovascular status: blood pressure returned to baseline and stable Postop Assessment: no apparent nausea or vomiting Anesthetic complications: no   No notable events documented.  Last Vitals:  Vitals:   04/12/24 0830 04/12/24 0843  BP: 109/73 118/73  Pulse: 78 68  Resp: 15 12  Temp:  36.6 C  SpO2: 100% 100%    Last Pain:  Vitals:   04/12/24 0843  TempSrc:   PainSc: 0-No pain                 Demisha Nokes,E. Anndee Connett

## 2024-04-12 NOTE — Transfer of Care (Signed)
 Immediate Anesthesia Transfer of Care Note  Patient: Alexander [name]  Procedure(s) Performed: COLONOSCOPY BIOPSY, GI  Patient Location: PACU  Anesthesia Type:MAC  Level of Consciousness: awake and patient cooperative  Airway & Oxygen Therapy: Patient Spontanous Breathing  Post-op Assessment: Report given to RN and Post -op Vital signs reviewed and stable  Post vital signs: Reviewed and stable  Last Vitals:  Vitals Value Taken Time  BP 116/74 04/12/24 08:26  Temp 36.3 C 04/12/24 08:26  Pulse 78 04/12/24 08:30  Resp 15 04/12/24 08:30  SpO2 100 % 04/12/24 08:30  Vitals shown include unfiled device data.  Last Pain:  Vitals:   04/12/24 0826  TempSrc:   PainSc: 0-No pain         Complications: No notable events documented.

## 2024-04-12 NOTE — Plan of Care (Signed)

## 2024-04-12 NOTE — Plan of Care (Signed)

## 2024-04-12 NOTE — Op Note (Signed)
 [facility] Patient Name: Alexander [name] Procedure Date: 04/12/2024 MRN: [id number] Attending MD: Aloha Finner , MD, [id number] Date of Birth: [birth year] CSN: [id number] Age: 63 Admit Type: Inpatient Procedure:                Colonoscopy Indications:              Screening for colorectal malignant neoplasm,                            Incidental - Follow-up endoscopy after surgery,                            Incidental - Follow-up of diverticulitis Providers:                Aloha Finner, MD, Collene Edu, RN, Curtistine Bishop, Technician Referring MD:             [name] MD, MD Medicines:                Monitored Anesthesia Care Complications:            No immediate complications. Estimated Blood Loss:     Estimated blood loss was minimal. Procedure:                Pre-Anesthesia Assessment:                           - Prior to the procedure, a History and Physical                            was performed, and patient medications and                            allergies were reviewed. The patient's tolerance of                            previous anesthesia was also reviewed. The risks                            and benefits of the procedure and the sedation                            options and risks were discussed with the patient.                            All questions were answered, and informed consent                            was obtained. Prior Anticoagulants: The patient has                            taken heparin , last dose was day of procedure. ASA  Grade Assessment: III - A patient with severe                            systemic disease. After reviewing the risks and                            benefits, the patient was deemed in satisfactory                            condition to undergo the procedure.                           After obtaining informed consent, the colonoscope                             was passed under direct vision. Throughout the                            procedure, the patient's blood pressure, pulse, and                            oxygen saturations were monitored continuously. The                            CF-HQ190L ([id number]) Olympus colonoscope was                            introduced through the anus and advanced to the the                            splenic flexure for evaluation. This was the                            intended extent. The colonoscopy was performed                            without difficulty. The patient tolerated the                            procedure. The quality of the bowel preparation was                            fair. Scope In: 8:01:24 AM Scope Out: 8:15:35 AM Total Procedure Duration: 0 hours 14 minutes 11 seconds  Findings:      The digital rectal exam findings include hemorrhoids. Pertinent       negatives include no palpable rectal lesions.      Non-bleeding non-thrombosed internal hemorrhoids were found during       perianal exam and during digital exam. The hemorrhoids were Grade II       (internal hemorrhoids that prolapse but reduce spontaneously).      A large amount of solid stool and mucus balls was found in the entire       colon, making visualization difficult. Suction via Endoscope was       performed. Lavage  of the area was performed using copious amounts,       resulting in clearance with fair visualization.      Diffuse moderate mucosal changes characterized by congestion (edema),       friability, granularity and mucus were found in the rectum, in the       recto-sigmoid colon and in the sigmoid colon. Biopsies were taken with a       cold forceps for histology.      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 Impression:                - Stool and mucus balls in the entire examined                            colon. Suctioned and lavaged copiously for fair                            preparation.                           - Hemorrhoids found on digital rectal exam.                           - Non-bleeding non-thrombosed internal hemorrhoids                            noted on endoscopy.                           - Diffuse moderate mucosal changes were found in                            the rectum, in the recto-sigmoid colon and in the                            sigmoid colon, rule out diversion colitis. Biopsied.                           - Colocutaneous fistula noted within the sigmoid                            colon (approximately 24 cm from anal os). Moderate Sedation:      Not Applicable - Patient had care per Anesthesia. Recommendation:           - The patient will be observed post-procedure,                            until all discharge criteria are met.                           - Return patient to hospital ward for ongoing care.                           -  Diet as per surgical service with upcoming                            surgery planned for tomorrow.                           - Await pathology results.                           - Consider repeat examination via flexible                            sigmoidoscopy in 1 year, if patient's colon is kept                            intact.                           - Further recommendations as per surgical service.                           - May restart heparin  at 12 PM.                           - The findings and recommendations were discussed                            with the patient.                           - The findings and recommendations were discussed                            with the referring physician. Procedure Code(s):        --- Professional ---                           [phone], 52, Colonoscopy, flexible; with biopsy,                             single or multiple Diagnosis Code(s):        --- Professional ---                           Z12.11, Encounter for screening for malignant                            neoplasm of colon                           K64.1, Second degree hemorrhoids                           K62.89, Other specified diseases of anus and rectum                           K63.89, Other specified diseases  of intestine                           K63.2, Fistula of intestine CPT copyright 2022 American Medical Association. All rights reserved. The codes documented in this report are preliminary and upon coder review may  be revised to meet current compliance requirements. Aloha Finner, MD 04/12/2024 8:25:52 AM Number of Addenda: 0
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
## 2024-04-13 ENCOUNTER — Other Ambulatory Visit: Payer: Self-pay

## 2024-04-13 ENCOUNTER — Inpatient Hospital Stay (HOSPITAL_COMMUNITY): Admission: RE | Admit: 2024-04-13 | Source: Home / Self Care | Admitting: General Surgery

## 2024-04-13 ENCOUNTER — Telehealth: Payer: Self-pay

## 2024-04-13 ENCOUNTER — Inpatient Hospital Stay (HOSPITAL_COMMUNITY): Payer: Self-pay | Admitting: Anesthesiology

## 2024-04-13 ENCOUNTER — Encounter (HOSPITAL_COMMUNITY)
Admission: AD | Disposition: A | Discharge status: Nursing Home (Includes ICF) | Payer: Self-pay | Source: Home / Self Care | Attending: General Surgery

## 2024-04-13 ENCOUNTER — Encounter (HOSPITAL_COMMUNITY): Payer: Self-pay

## 2024-04-13 ENCOUNTER — Encounter (HOSPITAL_COMMUNITY): Payer: Self-pay | Admitting: Anesthesiology

## 2024-04-13 DIAGNOSIS — I509 Heart failure, unspecified: Secondary | ICD-10-CM | POA: Diagnosis not present

## 2024-04-13 DIAGNOSIS — Z932 Ileostomy status: Secondary | ICD-10-CM

## 2024-04-13 DIAGNOSIS — T81321D Disruption or dehiscence of closure of internal operation (surgical) wound of abdominal wall muscle or fascia, subsequent encounter: Secondary | ICD-10-CM | POA: Diagnosis not present

## 2024-04-13 DIAGNOSIS — I110 Hypertensive heart disease with heart failure: Secondary | ICD-10-CM | POA: Diagnosis not present

## 2024-04-13 DIAGNOSIS — K660 Peritoneal adhesions (postprocedural) (postinfection): Secondary | ICD-10-CM

## 2024-04-13 HISTORY — PX: BOWEL RESECTION: SHX1257

## 2024-04-13 HISTORY — PX: ILEOSTOMY CLOSURE: SHX1784

## 2024-04-13 HISTORY — PX: LAPAROTOMY: SHX154

## 2024-04-13 HISTORY — PX: LAPAROSCOPY: SHX197

## 2024-04-13 LAB — BASIC METABOLIC PANEL WITH GFR
Anion gap: 7 (ref 5–15)
BUN: 9 mg/dL (ref 8–23)
CO2: 21 mmol/L — ABNORMAL LOW (ref 22–32)
Calcium: 9.6 mg/dL (ref 8.9–10.3)
Chloride: 106 mmol/L (ref 98–111)
Creatinine, Ser: 0.73 mg/dL (ref 0.61–1.24)
GFR, Estimated: >60 mL/min (ref >60)
Glucose, Bld: 90 mg/dL (ref 70–99)
Potassium: 4.1 mmol/L (ref 3.5–5.1)
Sodium: 134 mmol/L — ABNORMAL LOW (ref 135–145)

## 2024-04-13 LAB — CBC
HCT: 29.2 % — ABNORMAL LOW (ref 39.0–52.0)
Hemoglobin: 9.5 g/dL — ABNORMAL LOW (ref 13.0–17.0)
MCH: 30.9 pg (ref 26.0–34.0)
MCHC: 32.5 g/dL (ref 30.0–36.0)
MCV: 95.1 fL (ref 80.0–100.0)
Platelets: 232 K/uL (ref 150–400)
RBC: 3.07 MIL/uL — ABNORMAL LOW (ref 4.22–5.81)
RDW: 16.6 % — ABNORMAL HIGH (ref 11.5–15.5)
WBC: 5 K/uL (ref 4.0–10.5)
nRBC: 0 % (ref 0.0–0.2)

## 2024-04-13 LAB — POCT I-STAT 7, (LYTES, BLD GAS, ICA,H+H)
Acid-base deficit: 7 mmol/L — ABNORMAL HIGH (ref 0.0–2.0)
Bicarbonate: 19.6 mmol/L — ABNORMAL LOW (ref 20.0–28.0)
Calcium, Ion: 1.33 mmol/L (ref 1.15–1.40)
HCT: 29 % — ABNORMAL LOW (ref 39.0–52.0)
Hemoglobin: 9.9 g/dL — ABNORMAL LOW (ref 13.0–17.0)
O2 Saturation: 100 %
Patient temperature: 35.2
Potassium: 3.8 mmol/L (ref 3.5–5.1)
Sodium: 138 mmol/L (ref 135–145)
TCO2: 21 mmol/L — ABNORMAL LOW (ref 22–32)
pCO2 arterial: 40.7 mmHg (ref 32–48)
pH, Arterial: 7.281 — ABNORMAL LOW (ref 7.35–7.45)
pO2, Arterial: 259 mmHg — ABNORMAL HIGH (ref 83–108)

## 2024-04-13 LAB — PROTIME-INR
INR: 1.1 (ref 0.8–1.2)
Prothrombin Time: 14.4 s (ref 11.4–15.2)

## 2024-04-13 SURGERY — CLOSURE, ILEOSTOMY
Anesthesia: General | Site: Abdomen

## 2024-04-13 MED ORDER — LACTATED RINGERS IV SOLN: INTRAVENOUS | Status: DC

## 2024-04-13 MED ORDER — FENTANYL CITRATE (PF) 250 MCG/5ML IJ SOLN
INTRAMUSCULAR | Status: AC
  Filled 2024-04-13: qty 5

## 2024-04-13 MED ORDER — HYDROMORPHONE HCL 1 MG/ML IJ SOLN
INTRAMUSCULAR | Status: AC
  Filled 2024-04-13: qty 1

## 2024-04-13 MED ORDER — ONDANSETRON HCL 4 MG/2ML IJ SOLN
INTRAMUSCULAR | Status: AC
  Filled 2024-04-13: qty 2

## 2024-04-13 MED ORDER — ALBUMIN HUMAN 5 % IV SOLN
INTRAVENOUS | Status: AC
  Filled 2024-04-13: qty 250

## 2024-04-13 MED ORDER — DEXAMETHASONE SODIUM PHOSPHATE 10 MG/ML IJ SOLN
INTRAMUSCULAR | Status: DC | PRN
  Administered 2024-04-13: 10 mg via INTRAVENOUS

## 2024-04-13 MED ORDER — SUGAMMADEX SODIUM 200 MG/2ML IV SOLN
INTRAVENOUS | Status: DC | PRN
  Administered 2024-04-13: 200 mg via INTRAVENOUS

## 2024-04-13 MED ORDER — PHENYLEPHRINE 80 MCG/ML (10ML) SYRINGE FOR IV PUSH (FOR BLOOD PRESSURE SUPPORT)
PREFILLED_SYRINGE | INTRAVENOUS | Status: AC
  Filled 2024-04-13: qty 10

## 2024-04-13 MED ORDER — ACETAMINOPHEN 10 MG/ML IV SOLN
INTRAVENOUS | Status: AC
  Filled 2024-04-13: qty 100

## 2024-04-13 MED ORDER — DEXAMETHASONE SODIUM PHOSPHATE 10 MG/ML IJ SOLN
INTRAMUSCULAR | Status: AC
  Filled 2024-04-13: qty 1

## 2024-04-13 MED ORDER — PROPOFOL 10 MG/ML IV BOLUS
INTRAVENOUS | Status: DC | PRN
Start: 2024-04-13 — End: 2024-04-13
  Administered 2024-04-13: 100 mg via INTRAVENOUS
  Administered 2024-04-13: 40 mg via INTRAVENOUS

## 2024-04-13 MED ORDER — HYDROMORPHONE HCL 2 MG/ML IJ SOLN
INTRAMUSCULAR | Status: AC
Start: 2024-04-13 — End: 2024-04-13
  Filled 2024-04-13: qty 1

## 2024-04-13 MED ORDER — CHLORHEXIDINE GLUCONATE 0.12 % MT SOLN
15.0000 mL | Freq: Once | OROMUCOSAL | Status: AC
  Administered 2024-04-13: 15 mL via OROMUCOSAL

## 2024-04-13 MED ORDER — DEXMEDETOMIDINE HCL IN NACL 80 MCG/20ML IV SOLN
INTRAVENOUS | Status: DC | PRN
Start: 2024-04-13 — End: 2024-04-13
  Administered 2024-04-13: 8 ug via INTRAVENOUS
  Administered 2024-04-13: 4 ug via INTRAVENOUS
  Administered 2024-04-13: 8 ug via INTRAVENOUS

## 2024-04-13 MED ORDER — LACTATED RINGERS IV SOLN: INTRAVENOUS | Status: DC | PRN

## 2024-04-13 MED ORDER — ROCURONIUM BROMIDE 10 MG/ML (PF) SYRINGE
PREFILLED_SYRINGE | INTRAVENOUS | Status: AC
  Filled 2024-04-13: qty 10

## 2024-04-13 MED ORDER — NALOXONE HCL 0.4 MG/ML IJ SOLN: 0.4000 mg | INTRAMUSCULAR | Status: DC | PRN

## 2024-04-13 MED ORDER — HYDROMORPHONE 1 MG/ML IV SOLN
INTRAVENOUS | Status: DC
  Administered 2024-04-13: 30 mg via INTRAVENOUS
  Administered 2024-04-14: 3.3 mg via INTRAVENOUS
  Administered 2024-04-14: 2.9 mL via INTRAVENOUS
  Administered 2024-04-14: 2.1 mL via INTRAVENOUS
  Administered 2024-04-14: 3.6 mL via INTRAVENOUS
  Administered 2024-04-15: 1.5 mg via INTRAVENOUS
  Administered 2024-04-15: 30 mg via INTRAVENOUS
  Administered 2024-04-15: 4.3 mg via INTRAVENOUS
  Administered 2024-04-16: 3.9 mg via INTRAVENOUS
  Administered 2024-04-16: 1.8 mg via INTRAVENOUS
  Administered 2024-04-16: 30 mg via INTRAVENOUS
  Administered 2024-04-17: 2.4 mg via INTRAVENOUS
  Administered 2024-04-17: 0.3 mg via INTRAVENOUS
  Administered 2024-04-17: 3.9 mg via INTRAVENOUS
  Administered 2024-04-17: 2.7 mg via INTRAVENOUS
  Filled 2024-04-13 (×3): qty 30

## 2024-04-13 MED ORDER — HYDROMORPHONE HCL 1 MG/ML IJ SOLN
INTRAMUSCULAR | Status: DC | PRN
  Administered 2024-04-13: 1 mg via INTRAVENOUS
  Administered 2024-04-13: .5 mg via INTRAVENOUS
  Administered 2024-04-13 (×2): 1 mg via INTRAVENOUS
  Administered 2024-04-13: .5 mg via INTRAVENOUS

## 2024-04-13 MED ORDER — HYDROMORPHONE HCL 1 MG/ML IJ SOLN
0.2500 mg | INTRAMUSCULAR | Status: DC | PRN
  Administered 2024-04-13 (×2): 0.5 mg via INTRAVENOUS

## 2024-04-13 MED ORDER — SODIUM CHLORIDE 0.9% FLUSH: 9.0000 mL | INTRAVENOUS | Status: DC | PRN

## 2024-04-13 MED ORDER — ONDANSETRON HCL 4 MG/2ML IJ SOLN
INTRAMUSCULAR | Status: DC | PRN
  Administered 2024-04-13: 4 mg via INTRAVENOUS

## 2024-04-13 MED ORDER — DIPHENHYDRAMINE HCL 50 MG/ML IJ SOLN
12.5000 mg | Freq: Four times a day (QID) | INTRAMUSCULAR | Status: DC | PRN
  Administered 2024-04-14: 12.5 mg via INTRAVENOUS
  Filled 2024-04-13 (×3): qty 1

## 2024-04-13 MED ORDER — ROCURONIUM BROMIDE 100 MG/10ML IV SOLN
INTRAVENOUS | Status: DC | PRN
  Administered 2024-04-13: 10 mg via INTRAVENOUS
  Administered 2024-04-13: 30 mg via INTRAVENOUS
  Administered 2024-04-13: 10 mg via INTRAVENOUS
  Administered 2024-04-13: 20 mg via INTRAVENOUS
  Administered 2024-04-13 (×2): 10 mg via INTRAVENOUS
  Administered 2024-04-13: 50 mg via INTRAVENOUS

## 2024-04-13 MED ORDER — ONDANSETRON HCL 4 MG/2ML IJ SOLN
4.0000 mg | Freq: Four times a day (QID) | INTRAMUSCULAR | Status: DC | PRN
  Administered 2024-04-19: 4 mg via INTRAVENOUS
  Filled 2024-04-13: qty 2

## 2024-04-13 MED ORDER — ALBUMIN HUMAN 5 % IV SOLN: INTRAVENOUS | Status: DC | PRN

## 2024-04-13 MED ORDER — SODIUM CHLORIDE (PF) 0.9 % IJ SOLN
INTRAMUSCULAR | Status: AC
Start: 2024-04-13 — End: 2024-04-13
  Filled 2024-04-13: qty 10

## 2024-04-13 MED ORDER — VASOPRESSIN 20 UNIT/ML IV SOLN
INTRAVENOUS | Status: AC
Start: 2024-04-13 — End: 2024-04-13
  Filled 2024-04-13: qty 1

## 2024-04-13 MED ORDER — LIDOCAINE HCL (PF) 2 % IJ SOLN
INTRAMUSCULAR | Status: AC
Start: 2024-04-13 — End: 2024-04-13
  Filled 2024-04-13: qty 5

## 2024-04-13 MED ORDER — MIDAZOLAM HCL 5 MG/5ML IJ SOLN
INTRAMUSCULAR | Status: DC | PRN
  Administered 2024-04-13: 2 mg via INTRAVENOUS

## 2024-04-13 MED ORDER — SODIUM CHLORIDE 0.9 % IV SOLN: 12.5000 mg | Freq: Four times a day (QID) | INTRAVENOUS | Status: DC | PRN

## 2024-04-13 MED ORDER — METHOCARBAMOL 1000 MG/10ML IJ SOLN
500.0000 mg | Freq: Once | INTRAMUSCULAR | Status: AC
  Administered 2024-04-13: 500 mg via INTRAVENOUS

## 2024-04-13 MED ORDER — DIPHENHYDRAMINE HCL 12.5 MG/5ML PO ELIX
12.5000 mg | ORAL_SOLUTION | Freq: Four times a day (QID) | ORAL | Status: DC | PRN
Start: 2024-04-13 — End: 2024-04-30

## 2024-04-13 MED ORDER — FENTANYL CITRATE (PF) 100 MCG/2ML IJ SOLN
INTRAMUSCULAR | Status: AC
  Filled 2024-04-13: qty 2

## 2024-04-13 MED ORDER — BUPIVACAINE-EPINEPHRINE (PF) 0.25% -1:200000 IJ SOLN
INTRAMUSCULAR | Status: AC
  Filled 2024-04-13: qty 30

## 2024-04-13 MED ORDER — ALBUMIN HUMAN 5 % IV SOLN
INTRAVENOUS | Status: AC
Start: 2024-04-13 — End: 2024-04-13
  Filled 2024-04-13: qty 250

## 2024-04-13 MED ORDER — METHOCARBAMOL 1000 MG/10ML IJ SOLN
INTRAMUSCULAR | Status: AC
  Filled 2024-04-13: qty 10

## 2024-04-13 MED ORDER — DIPHENHYDRAMINE HCL 12.5 MG/5ML PO ELIX: 12.5000 mg | ORAL_SOLUTION | Freq: Four times a day (QID) | ORAL | Status: DC | PRN

## 2024-04-13 MED ORDER — MIDAZOLAM HCL 2 MG/2ML IJ SOLN
INTRAMUSCULAR | Status: AC
  Filled 2024-04-13: qty 2

## 2024-04-13 MED ORDER — SODIUM CHLORIDE 0.9% FLUSH
9.0000 mL | INTRAVENOUS | Status: DC | PRN
Start: 2024-04-13 — End: 2024-04-30

## 2024-04-13 MED ORDER — FENTANYL CITRATE (PF) 250 MCG/5ML IJ SOLN
INTRAMUSCULAR | Status: DC | PRN
  Administered 2024-04-13 (×5): 50 ug via INTRAVENOUS

## 2024-04-13 MED ORDER — ACETAMINOPHEN 10 MG/ML IV SOLN
1000.0000 mg | Freq: Four times a day (QID) | INTRAVENOUS | Status: AC
  Administered 2024-04-13 – 2024-04-14 (×4): 1000 mg via INTRAVENOUS
  Filled 2024-04-13 (×4): qty 100

## 2024-04-13 MED ORDER — FENTANYL CITRATE (PF) 100 MCG/2ML IJ SOLN
INTRAMUSCULAR | Status: DC | PRN
  Administered 2024-04-13: 100 ug via INTRAVENOUS

## 2024-04-13 MED ORDER — METHOCARBAMOL 1000 MG/10ML IJ SOLN
500.0000 mg | Freq: Three times a day (TID) | INTRAMUSCULAR | Status: DC | PRN
  Administered 2024-04-13 – 2024-04-27 (×13): 500 mg via INTRAVENOUS
  Filled 2024-04-13 (×13): qty 10

## 2024-04-13 MED ORDER — HYDROMORPHONE HCL 2 MG/ML IJ SOLN
INTRAMUSCULAR | Status: AC
  Filled 2024-04-13: qty 1

## 2024-04-13 MED ORDER — SODIUM CHLORIDE (PF) 0.9 % IJ SOLN
INTRAMUSCULAR | Status: AC
  Filled 2024-04-13: qty 10

## 2024-04-13 MED ORDER — LACTATED RINGERS IV BOLUS
500.0000 mL | Freq: Once | INTRAVENOUS | Status: AC
  Administered 2024-04-13: 500 mL via INTRAVENOUS

## 2024-04-13 MED ORDER — DROPERIDOL 2.5 MG/ML IJ SOLN: 0.6250 mg | Freq: Once | INTRAMUSCULAR | Status: DC | PRN

## 2024-04-13 MED ORDER — ACETAMINOPHEN 10 MG/ML IV SOLN
1000.0000 mg | Freq: Once | INTRAVENOUS | Status: AC
  Administered 2024-04-13: 1000 mg via INTRAVENOUS

## 2024-04-13 MED ORDER — CHLORHEXIDINE GLUCONATE CLOTH 2 % EX PADS
6.0000 | MEDICATED_PAD | Freq: Every day | CUTANEOUS | Status: DC
  Administered 2024-04-14 – 2024-04-16 (×3): 6 via TOPICAL

## 2024-04-13 MED ORDER — PHENYLEPHRINE HCL-NACL 20-0.9 MG/250ML-% IV SOLN
INTRAVENOUS | Status: DC | PRN
  Administered 2024-04-13: 40 ug/min via INTRAVENOUS
  Administered 2024-04-13: 160 ug via INTRAVENOUS

## 2024-04-13 MED ORDER — HYDROMORPHONE HCL 1 MG/ML IJ SOLN
INTRAMUSCULAR | Status: AC
  Filled 2024-04-13: qty 2

## 2024-04-13 MED ORDER — HYDROMORPHONE HCL 1 MG/ML IJ SOLN
0.2500 mg | INTRAMUSCULAR | Status: DC | PRN
  Administered 2024-04-13 (×6): 0.5 mg via INTRAVENOUS

## 2024-04-13 MED ORDER — HEPARIN SODIUM (PORCINE) 5000 UNIT/ML IJ SOLN
5000.0000 [IU] | Freq: Once | INTRAMUSCULAR | Status: AC
  Administered 2024-04-13: 5000 [IU] via SUBCUTANEOUS
  Filled 2024-04-13 (×2): qty 1

## 2024-04-13 MED ORDER — 0.9 % SODIUM CHLORIDE (POUR BTL) OPTIME
TOPICAL | Status: DC | PRN
  Administered 2024-04-13 (×2): 1000 mL

## 2024-04-13 MED ORDER — DIPHENHYDRAMINE HCL 50 MG/ML IJ SOLN
12.5000 mg | Freq: Four times a day (QID) | INTRAMUSCULAR | Status: DC | PRN
  Administered 2024-04-13 – 2024-04-14 (×2): 12.5 mg via INTRAVENOUS

## 2024-04-13 MED ORDER — INDOCYANINE GREEN 25 MG IV SOLR
INTRAVENOUS | Status: DC | PRN
  Administered 2024-04-13: 7.5 mg via INTRAVENOUS

## 2024-04-13 MED ORDER — PHENYLEPHRINE 80 MCG/ML (10ML) SYRINGE FOR IV PUSH (FOR BLOOD PRESSURE SUPPORT)
PREFILLED_SYRINGE | INTRAVENOUS | Status: DC | PRN
  Administered 2024-04-13 (×2): 80 ug via INTRAVENOUS
  Administered 2024-04-13 (×2): 160 ug via INTRAVENOUS
  Administered 2024-04-13: 80 ug via INTRAVENOUS
  Administered 2024-04-13: 160 ug via INTRAVENOUS
  Administered 2024-04-13: 80 ug via INTRAVENOUS

## 2024-04-13 MED ORDER — PROPOFOL 10 MG/ML IV BOLUS
INTRAVENOUS | Status: AC
  Filled 2024-04-13: qty 20

## 2024-04-13 SURGICAL SUPPLY — 81 items
BAG COUNTER SPONGE SURGICOUNT (BAG) IMPLANT
BLADE EXTENDED COATED 6.5IN (ELECTRODE) ×2 IMPLANT
BLADE SURG SZ10 CARB STEEL (BLADE) IMPLANT
BNDG GAUZE DERMACEA FLUFF 4 (GAUZE/BANDAGES/DRESSINGS) ×1 IMPLANT
CATH ROBINSON RED A/P 16FR (CATHETERS) ×1 IMPLANT
CELLS DAT CNTRL 66122 CELL SVR (MISCELLANEOUS) IMPLANT
CHLORAPREP W/TINT 26 (MISCELLANEOUS) ×2 IMPLANT
CLIP APPLIE 5 13 M/L LIGAMAX5 (MISCELLANEOUS) IMPLANT
CLIP APPLIE ROT 10 11.4 M/L (STAPLE) IMPLANT
CLIP TI LARGE 6 (CLIP) IMPLANT
COVER MAYO STAND STRL (DRAPES) IMPLANT
COVER SURGICAL LIGHT HANDLE (MISCELLANEOUS) ×3 IMPLANT
DERMABOND ADVANCED .7 DNX12 (GAUZE/BANDAGES/DRESSINGS) IMPLANT
DRAIN CHANNEL 19F RND (DRAIN) IMPLANT
DRAPE LAPAROSCOPIC ABDOMINAL (DRAPES) IMPLANT
DRAPE SHEET LG 3/4 BI-LAMINATE (DRAPES) ×1 IMPLANT
DRAPE UNDERBUTTOCKS STRL (DISPOSABLE) ×1 IMPLANT
DRAPE WARM FLUID 44X44 (DRAPES) ×1 IMPLANT
DRSG OPSITE POSTOP 4X6 (GAUZE/BANDAGES/DRESSINGS) IMPLANT
DRSG OPSITE POSTOP 4X8 (GAUZE/BANDAGES/DRESSINGS) IMPLANT
ELECT REM PT RETURN 15FT ADLT (MISCELLANEOUS) ×3 IMPLANT
EVACUATOR SILICONE 100CC (DRAIN) ×1 IMPLANT
GAUZE PAD ABD 8X10 STRL (GAUZE/BANDAGES/DRESSINGS) ×2 IMPLANT
GAUZE SPONGE 4X4 12PLY STRL (GAUZE/BANDAGES/DRESSINGS) ×3 IMPLANT
GLOVE BIO SURGEON STRL SZ7.5 (GLOVE) ×4 IMPLANT
GLOVE INDICATOR 8.0 STRL GRN (GLOVE) ×4 IMPLANT
GOWN STRL REUS W/ TWL XL LVL3 (GOWN DISPOSABLE) ×4 IMPLANT
HANDLE SUCTION POOLE (INSTRUMENTS) ×1 IMPLANT
IRRIGATION SUCT STRKRFLW 2 WTP (MISCELLANEOUS) IMPLANT
KIT BASIN OR (CUSTOM PROCEDURE TRAY) ×2 IMPLANT
KIT TURNOVER KIT A (KITS) ×3 IMPLANT
LEGGING LITHOTOMY PAIR STRL (DRAPES) IMPLANT
LIGASURE IMPACT 36 18CM CVD LR (INSTRUMENTS) ×1 IMPLANT
NS IRRIG 1000ML POUR BTL (IV SOLUTION) ×4 IMPLANT
PACK GENERAL/GYN (CUSTOM PROCEDURE TRAY) ×2 IMPLANT
PACK SPY-PHI (KITS) ×1 IMPLANT
POUCH DRAINABLE 1PC 2 1/4 FLAT (OSTOMY) ×1 IMPLANT
RELOAD GRN CONTOUR (ENDOMECHANICALS) ×4 IMPLANT
RELOAD STAPLE 40 GRN THCK (ENDOMECHANICALS) ×2 IMPLANT
RELOAD STAPLE 60 3.6 BLU REG (STAPLE) ×1 IMPLANT
RELOAD STAPLER BLUE 60MM (STAPLE) ×2 IMPLANT
RETRACTOR WND ALEXIS 18 MED (MISCELLANEOUS) IMPLANT
SCISSORS LAP 5X35 DISP (ENDOMECHANICALS) ×1 IMPLANT
SEPRAFILM PROCEDURAL PACK 3X5 (MISCELLANEOUS) ×1 IMPLANT
SHEARS FOC LG CVD HARMONIC 17C (MISCELLANEOUS) IMPLANT
SHEARS HARMONIC 36 ACE (MISCELLANEOUS) IMPLANT
SLEEVE ADV FIXATION 5X100MM (TROCAR) IMPLANT
SPIKE FLUID TRANSFER (MISCELLANEOUS) ×2 IMPLANT
STAPLE ECHEON FLEX 60 POW ENDO (STAPLE) ×1 IMPLANT
STAPLER 90 3.5 STD SLIM (STAPLE) ×1 IMPLANT
STAPLER CVD CUT GN 40 RELOAD (ENDOMECHANICALS) ×2 IMPLANT
STAPLER CVD CUT GRN 40 RELOAD (ENDOMECHANICALS) ×1 IMPLANT
STAPLER ECHELON POWER CIR 31 (STAPLE) ×1 IMPLANT
STAPLER SKIN PROX 35W (STAPLE) ×1 IMPLANT
STRIP CLOSURE SKIN 1/2X4 (GAUZE/BANDAGES/DRESSINGS) IMPLANT
SUT NOVA NAB GS-21 0 18 T12 DT (SUTURE) ×2 IMPLANT
SUT PDS AB 1 TP1 96 (SUTURE) ×2 IMPLANT
SUT PDS AB 3-0 SH 27 (SUTURE) ×1 IMPLANT
SUT PDS AB 4-0 SH 27 (SUTURE) IMPLANT
SUT PROLENE 2 0 BLUE (SUTURE) IMPLANT
SUT PROLENE 2 0 KS (SUTURE) ×1 IMPLANT
SUT SILK 2 0 SH CR/8 (SUTURE) ×3 IMPLANT
SUT SILK 2 0SH CR/8 30 (SUTURE) IMPLANT
SUT SILK 2-0 18XBRD TIE 12 (SUTURE) ×3 IMPLANT
SUT SILK 2-0 30XBRD TIE 12 (SUTURE) IMPLANT
SUT SILK 3 0 SH CR/8 (SUTURE) ×4 IMPLANT
SUT SILK 3-0 18XBRD TIE 12 (SUTURE) ×3 IMPLANT
SUT VIC AB 3-0 SH 18 (SUTURE) ×2 IMPLANT
SYR BULB IRRIG 60ML STRL (SYRINGE) IMPLANT
SYSTEM LAPSCP GELPORT 120MM (MISCELLANEOUS) IMPLANT
TOWEL OR 17X26 10 PK STRL BLUE (TOWEL DISPOSABLE) ×4 IMPLANT
TRAY FOLEY MTR SLVR 16FR STAT (SET/KITS/TRAYS/PACK) ×2 IMPLANT
TRAY LAPAROSCOPIC (CUSTOM PROCEDURE TRAY) ×1 IMPLANT
TROCAR 11X100 Z THREAD (TROCAR) IMPLANT
TROCAR ADV FIXATION 5X100MM (TROCAR) ×1 IMPLANT
TROCAR BALLN 12MMX100 BLUNT (TROCAR) IMPLANT
TROCAR XCEL NON-BLD 5MMX100MML (ENDOMECHANICALS) IMPLANT
TUBING CONNECTING 10 (TUBING) ×1 IMPLANT
TUBING ENDO SMARTCAP CO2 EXT (MISCELLANEOUS) ×1 IMPLANT
WATER STERILE IRR 500ML POUR (IV SOLUTION) ×1 IMPLANT
YANKAUER SUCT BULB TIP NO VENT (SUCTIONS) IMPLANT
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
## 2024-04-13 NOTE — Anesthesia Postprocedure Evaluation (Signed)
 Anesthesia Post Note  Patient: Alexander [name]  Procedure(s) Performed: ILEOSTOMY TAKEDOWN (Abdomen) LYSIS, ADHESIONS 2 hours (Abdomen) SIGMOIDCOLECTOMY WITH DIVERTING LOOP ILEOSTOMY (Abdomen) LAPAROTOMY, EXPLORATORY (Abdomen)     Patient location during evaluation: PACU Anesthesia Type: General Level of consciousness: sedated and patient cooperative Pain management: pain level controlled Vital Signs Assessment: post-procedure vital signs reviewed and stable Respiratory status: spontaneous breathing Cardiovascular status: stable Anesthetic complications: no   No notable events documented.  Last Vitals:  Vitals:   04/13/24 2005 04/13/24 2020  BP: (!) 145/74   Pulse:  (!) 108  Resp: 15 16  Temp:    SpO2:  100%    Last Pain:  Vitals:   04/13/24 1930  TempSrc:   PainSc: 8                  Norleen Pope

## 2024-04-13 NOTE — Op Note (Signed)
 04/13/2024  5:06 PM  PATIENT:  Alexander [name]  63 y.o. male  PRE-OPERATIVE DIAGNOSIS:  history of right colectomy with end ileostomy for perforated bleeding diverticulitis April 2025  abdominal dehiscence with exposed bowel  POST-OPERATIVE DIAGNOSIS:  same  PROCEDURE:  Procedure(s): LAPAROTOMY, EXPLORATORY LYSIS of  ADHESIONS 2 hours ILEOSTOMY TAKEDOWN (ileostomy to distal transverse colon anastomosis) SIGMOID COLECTOMY WITH DIVERTING LOOP ILEOSTOMY MOBILIZATION OF SPLENIC FLEXURE Flexible sigmoidoscopy  SURGEON:  Surgeon(s): Tanda Locus, MD  ASSISTANTS: Kinsinger, Herlene Righter, MD   ANESTHESIA:   general  DRAINS: Nasogastric Tube and Urinary Catheter (Foley)   LOCAL MEDICATIONS USED:  NONE  SPECIMEN:  Source of Specimen:  sigmoid colon, rectum - stitch distal  EBL: 100 ml  Findings: Distal ileum to transverse colon anastomosis, sigmoid colectomy with anastomosis at about 16 cm from the anal verge, diverting loop ileostomy about 30 to 40 cm proximal to the ileotransverse colon anastomosis; Seprafilm   DISPOSITION OF SPECIMEN:  PATHOLOGY  COUNTS:  YES  INDICATION FOR PROCEDURE: 63 year old gentleman who had presented back in April with bleeding right-sided diverticulitis.  He ultimately perforated and underwent emergent right colectomy.  He went to the OR several times but ultimately ended up with an end ileostomy and a stapled off distal transverse colon.  He had a prolonged hospitalization.  His fascia dehisced slightly but there was no exposed viscera.  He showed up at his PCPs office with a change in his lower midline wound with exposed bowel.  It appeared to be consistent with small intestine.  Patient has had multiple ED visits to help manage and pouch is end ileostomy as well as his lower midline.  I was concerned with exposed small bowel in the lower midline that it would be a high risk for breaking down and developing an enterocutaneous fistula which would make keeping him  hydrated and nutritionally sound very challenging.  I recommended admission to the hospital so his INR could be reversed for planned reversal surgery.  He underwent colonoscopy yesterday to evaluate the lower colon since he had not had a colonoscopy.  During the colonoscopy it was found that part of the sigmoid colon was probably exposed in the midline as well although there is no defect in the sigmoid colon per se.  On reviewing his admission CT it appeared that the sigmoid colon came up in the midline right behind the small bowel and to the left of the open exposed bowel.  I discussed the finding with the patient this morning.  I felt that there was a moderate to high risk that we may injure the sigmoid colon and trying to gain access to the abdomen and take down adhesions therefore potentially requiring sigmoid colectomy.  We discussed that he may end up with an end colostomy or perhaps a diverting loop ileostomy depending on how the surgery progressed.  We had discussed the risk and benefits of the procedure in detail on several occasions including but not limited to bleeding, infection, injury to surrounding structures, enterotomy, colotomy, incisional hernia, need for additional procedures, ostomy issues, recurrent blood clot, fistula formation, ileus, prolonged hospitalization and potential death.  He elected proceed to surgery.  PROCEDURE: After obtaining informed consent the patient was taken the OR 2 at [facility] and placed supine on the operating room table.  General endotracheal anesthesia was established.  Sequential compression devices were placed.  He was placed in lithotomy position with the appropriate padding.  A Foley catheter was placed.  His ostomy  bag was removed and a pursestring suture of 2-0 silk was placed on the end ileostomy to prevent spillage during the case.  Adhesive debris was removed with adhesive remover.  Then chlorhexidine  wipes were used to wipe down the abdominal  wall.  And then his abdomen was prepped and draped in the usual standard surgical fashion with ChloraPrep.  The midline wound which was open with exposed bowel and the end ileostomy were prepped with Betadine.  A surgical timeout was performed.  He received IV antibiotic prior to skin incision.  The lower midline was the area that had the exposed bowel.  So an upper midline incision was made sharply with a 10 blade.  The subcutaneous tissue was divided with electrocautery.  He had very thin subcutaneous tissue.  The fascia was entered high in the upper midline and the abdominal cavity was entered.  He had small bowel adhesions to the anterior abdominal wall.  Using a pair of Metzenbaums we started taking down the small bowel from the anterior abdominal wall starting cephalad and working down.  We got to the level of the umbilicus.  It was easier to gain access to the right lower quadrant these small bowel adhesions to the anterior abdominal were easier to take down on the right side.  We then turned our attention to the left lower quadrant and the midline.  A loop of bowel was plastered to the left lower quadrant abdominal wall.  We ended up getting intraluminal which was not an unexpected occurrence.  It appeared that this was probably a section of redundant sigmoid colon.  We were able to get back to the peritoneum and get the sigmoid colon detached from the anterior abdominal wall with a pair of Metzenbaums.  A loop of small bowel was fairly adhered to the sigmoid colon that had been coming out through the midline.  Where we will to mobilize the small bowel off the sigmoid colon.  At this time we placed a Balfour retractor.  We then continued mobilizing the small bowel from the right and left quadrants as well as along the right paracolic gutter using a combination of right angle and Metzenbaum scissors.  In order to continue to free the small bowel we decided it was going to be easier to take down the end  ileostomy.  The end ileostomy was incised with Bovie cautery subcutaneous tissue was divided we found the plane in the subcutaneous tissue between the end ileostomy and the soft tissue.  We were ultimately able to dunk the end ileostomy back into the abdominal cavity.  The patient had a loop of small bowel fairly adhered to the right liver posterior edge and a little bit adhered to the gallbladder.  It came off the gallbladder relatively easily with a pair of Metzenbaums and right angle.  We were then ultimately able to mobilize the loop of small bowel up off the right posterior edge of the liver.  Some of the small bowel was also adhered to the transverse colon.  We then freed interloop small bowel adhesions.  It took about 2 hours to lyse all the adhesions and to free up the small bowel and the colon from the midline as well as from the left lower quadrant.  We identified the ligament of Treitz.  We then were able to run the small bowel all the way from the ligament of Treitz to the end ileostomy.  There was no evidence of enterotomy or serosal tears.  The  sigmoid colon had come up out of the pelvis and then there was a large colotomy in the sigmoid colon and it went back down along the left paracolic gutter and back up toward the splenic flexure.  Just proximal to the sigmoid colon colotomy I stapled that off proximally with a green load of the contour.  We then took down some of the sigmoid colon mesentery sequentially with LigaSure device.  We identified the left ureter.  I then came across the junction of the rectosigmoid with a fire of the green load contour and this freed the sigmoid colon specimen and it was passed off the field.  I then mobilized the sigmoid colon proximally from the line of Toldt using right angle, cautery and LigaSure device.  I then took down some of the gastro colic ligament with LigaSure device.  Splenic flexure was identified and we ended up taking down the splenic flexure without  compromising the blood flow to the splenic flexure of the colon.  This allowed the descending colon and the transverse colon and the splenic flexure to be medialized.  We then brought the ileum up to the distal transverse colon to create a side-to-side functional end-to-end anastomosis.  The staple line of the transverse colon was excised and we evacuated old piece of stool.  A enterotomy was made in the ileum.  Then using a Ethicon Echelon powered 60 mm stapler with a blue load 1 arm of the stapler was placed through the colotomy and the other through the enterotomy in the stapler was brought together and fired to create a common channel thus creating an ileo to transverse colon anastomosis.  The common defect and the old ileostomy were then stapled off with a TX 90 with a blue load.  A 3-0 silk was placed in the crotch of the anastomosis.  The mesenteric defect was left open since it was a very large defect.  Prior to performing the ileocolonic anastomosis we ensured that the ileum/small bowel mesentery was not twisted when we brought it up.  Anesthesia gave the patient ICG dye.  Then using the open Stryker near infrared fluorescent camera system we visualized fluorescent activity in the ilio colonic anastomosis and then we looked at the sigmoid colon staple line and the rectosigmoid staple line and both had good perfusion to it.  We then turned our attention to reconnecting the sigmoid colon to the rectosigmoid junction.  Pursestring clamp was placed across the end of the sigmoid colon staple line.  A 2-0 Prolene on a Keith needle was used to create a pursestring through the pursestring clamp.  The staple line was then excised.  We then reinforced to the pursestring with several 3-0 silk sutures.  We then sized and it easily accommodated a 31 mm sizer.  The anvil of a 31 mm EEA stapler was placed in the sigmoid colon and the pursestring was tied down.  I then went below and passed several sizers up through the  anus up to the rectal stump which passed easily.  In advancing the 31 mm EEA stapler, a defect was made in the anterior wall of the distal rectum for about 4 cm just below the staple line.  I scrubbed back in.  We discussed next steps.  We briefly discussed doing an end colostomy but I did not think coming back in dealing with his pelvis at a later date would be any easier, we felt we could probably mobilize more of the rectum.  The  tissue planes in the pelvis specifically laterally and anteriorly were challenging but posteriorly they were not terribly violated or densely stuck.  We stayed close on the rectum using a combination of right angle and Bovie electrocautery and LigaSure.  We are able to mobilize distal to to the defect in the anterior wall of the upper rectum.  We are able to get another fire of a green load contour stapler distal to the defect in the rectum.  This created a fresh staple line.  There was a small gap anteriorly in the midportion of the anterior staple line.  We decided we would use this As the location to advance the spike 3.  I then went below and was able to advance the 31 mm stapler up and we advanced the spike up through the opening.  We then brought the sigmoid colon and mated it to the spike, the stapler was closed and compression was held for 60 seconds the stapler was then fired.  The stapler was then withdrawn.  We had 2 circumferentially intact donuts.  A flexible sigmoidoscopy was then performed while the pelvis had been flooded with saline.  The anastomosis is visualized at about 16 cm from the anal verge there were no bubbles in the pelvis and the anastomosis appeared intact without any signs of bleeding.  I scrubbed back in.  Since he had two colonic anastomoses and extensive lysis of adhesions we felt it would be safe to divert him with a loop ileostomy.  I reran the small bowel and again there is no evidence of serosal tear or enterotomy.  We identified a section of small  bowel probably about 30 cm or so proximal to the ileocolonic anastomosis to bring up through his old right lower quadrant ostomy defect.  Brought up easily.  It was secured with a Babcock clamp.  Because his midline had attempted to be closed by second intention the skin was very scarred down to the fascia.  In order to close the fascia we mobilized skin flaps on either side of the abdominal wall back several centimeters.  I then placed several pieces of Seprafilm in the abdomen to help prevent adhesions.  The patient had no omentum left.  The fascia was then closed with a #1 looped PDS 1 from above and 1 from below with several interrupted 0 Novafil sutures as internal retention sutures.  I then matured the loop ileostomy.  A transverse incision was made in the small bowel.  I then brooked the ileostomy in standard fashion with multiple 3-0 Vicryl sutures.  I had placed a small red rubber catheter as an ostomy bridge.  An ostomy appliance was applied.  Wet-to-dry Kerlix was placed in the midline followed by dry bandages.  Patient was extubated and taken to the recovery room in stable condition.  There were no immediate complications.  The patient tolerated the procedure well.  All needle, instrument, and sponge counts were correct x 2.  PLAN OF CARE: already inpt  PATIENT DISPOSITION:  PACU - hemodynamically stable.   Delay start of Pharmacological VTE agent (>24hrs) due to surgical blood loss or risk of bleeding:  can start hep gtt at 0600 without bolus  [name], MD, FACS General, Bariatric, & Minimally Invasive Surgery [facility],  [facility]

## 2024-04-13 NOTE — Progress Notes (Signed)
 Can start heparin  gtt at 0600 without bolus (assuming no significant drop in hgb overnight)  [name], MD, FACS General, Bariatric, & Minimally Invasive Surgery [facility] Surgery,  [facility]

## 2024-04-13 NOTE — Plan of Care (Signed)

## 2024-04-13 NOTE — Telephone Encounter (Signed)
-----   Message from [facility] sent at 04/12/2024 10:58 AM EDT ----- Regarding: Colonoscopy recall Alexander [name], This patient needs a 1 year colonoscopy recall. Thanks. GM

## 2024-04-13 NOTE — Transfer of Care (Signed)
 Immediate Anesthesia Transfer of Care Note  Patient: Alexander [name]  Procedure(s) Performed: ILEOSTOMY TAKEDOWN (Abdomen) LYSIS, ADHESIONS 2 hours (Abdomen) SIGMOIDCOLECTOMY WITH DIVERTING LOOP ILEOSTOMY (Abdomen) LAPAROTOMY, EXPLORATORY (Abdomen)  Patient Location: PACU  Anesthesia Type:General  Level of Consciousness: drowsy  Airway & Oxygen Therapy: Patient Spontanous Breathing and Patient connected to face mask oxygen  Post-op Assessment: Report given to RN and Post -op Vital signs reviewed and stable  Post vital signs: Reviewed and stable  Last Vitals:  Vitals Value Taken Time  BP 151/83 04/13/24 16:31  Temp 36.1 C 04/13/24 16:30  Pulse 131 04/13/24 16:44  Resp 24 04/13/24 16:44  SpO2 96 % 04/13/24 16:44  Vitals shown include unfiled device data.  Last Pain:  Vitals:   04/13/24 1643  TempSrc:   PainSc: 10-Worst pain ever         Complications: No notable events documented.

## 2024-04-13 NOTE — Progress Notes (Signed)
 PROGRESS NOTE    Alexander [name]  FMW:[id number] DOB: September 16, 1961 DOA: 04/09/2024 PCP: [name], PA   Brief Narrative:   63 y.o. 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  He underwent colonoscopy by GI on 04/12/2024.  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  General surgery following and planning for possible surgical intervention today- Underwent colonoscopy GI on 04/12/2024: Hemorrhoids and colocutaneous fistula noted.  History of PE/DVT on Coumadin  Supratherapeutic INR - Coumadin  on hold.  INR 1.1 today.  Received a dose of oral vitamin K  on 04/10/2024.  Monitor INR.  Heparin  held this morning for possible surgery   anemia of chronic disease - From chronic illnesses.  Hemoglobin stable.  Monitor intermittently  Acute metabolic acidosis - Mild.  Monitor Hyponatremia - Mild.  Monitor  Essential hypertension Hyperlipidemia Aortic atherosclerosis -Monitor.  Blood pressure currently stable.  Not on any medications for hyperlipidemia at this time.  Outpatient follow-up with PCP  Gout - Asymptomatic  Sleep disturbance - Continue trazodone  at bedtime  Pulmonary hypertension-grade 1 diastolic dysfunction - Outpatient follow-up  DVT prophylaxis: Heparin  drip held this morning for  possible surgery  code Status: Full Family Communication: None at bedside Disposition Plan: Status is: Inpatient Remains inpatient appropriate because: Of severity of illness  Consultants: General Surgery/GI  Procedures:  Colonoscopy on 04/12/2024 Impression:               - Stool and mucus balls in the entire examined                            colon. Suctioned and lavaged copiously for fair                            preparation.                           - Hemorrhoids found on digital rectal exam.                           - Non-bleeding non-thrombosed internal hemorrhoids                            noted on endoscopy.                           - Diffuse moderate mucosal changes were found in                            the rectum, in the recto-sigmoid colon and in the                            sigmoid  colon, rule out diversion colitis. Biopsied.                           - Colocutaneous fistula noted within the sigmoid                            colon (approximately 24 cm from anal os). Moderate Sedation:      Not Applicable - Patient had care per Anesthesia. Recommendation:           - The patient will be observed post-procedure,                            until all discharge criteria are met.                           - Return patient to hospital ward for ongoing care.                           - Diet as per surgical service with upcoming                            surgery planned for tomorrow.                           - Await pathology results.                           - Consider repeat examination via flexible                            sigmoidoscopy in 1 year, if patient's colon is kept                            intact.                           - Further recommendations as per surgical service.                           - May restart heparin  at 12 PM.                           - The findings and recommendations were discussed                            with the  patient.                           - The findings and recommendations were discussed                            with the referring physician.  Antimicrobials: None   Subjective: Patient seen and examined at bedside.  Has intermittent abdominal pains.  No chest pain or fever reported.   Objective: Vitals:   04/12/24 1617 04/12/24 2108 04/13/24 0435 04/13/24 0500  BP: ROLLEN)  103/54 110/65 114/62   Pulse: 94 85 66   Resp: 18 18 18    Temp: 98.2 F (36.8 C) 99.1 F (37.3 C) 98.6 F (37 C)   TempSrc:  Oral Oral   SpO2: 100% 99% 98%   Weight:    72.4 kg  Height:        Intake/Output Summary (Last 24 hours) at 04/13/2024 0752 Last data filed at 04/12/2024 1929 Gross per 24 hour  Intake 140.22 ml  Output 450 ml  Net -309.78 ml   Filed Weights   04/11/24 0451 04/12/24 0538 04/13/24 0500  Weight: 72.8 kg 71.8 kg 72.4 kg    Examination:  General: Currently on room air and in no distress ENT/neck: No palpable thyromegaly or elevated JVD noted  respiratory: Decreased breath sounds at bases bilaterally with some scattered crackles  abdominal: Soft, remains distended and mildly tender; no organomegaly, normal bowel sounds are heard.  Midline wound with exposed small bowel present Extremities: Trace lower extremity edema; no cyanosis  CNS: Awake and alert.  No obvious focal deficits  lymph: No palpable lymphadenopathy Skin: No obvious petechiae/rashes  psych: Mostly flat affect.  Currently not agitated. musculoskeletal: No joint deformity/swelling    Data Reviewed: I have personally reviewed following labs and imaging studies  CBC: Recent Labs  Lab 04/09/24 1542 04/11/24 0652 04/12/24 0047 04/13/24 0507  WBC 5.5 5.0 5.2 5.0  NEUTROABS  --  2.7  --   --   HGB 9.6* 9.8* 9.4* 9.5*  HCT 30.8* 31.2* 30.0* 29.2*  MCV 98.7 96.9 97.7 95.1  PLT 262 260 235 232   Basic Metabolic Panel: Recent Labs  Lab 04/09/24 1542 04/09/24 1608 04/10/24 0518 04/11/24 0652 04/13/24 0507   NA  --  139 137 137 134*  K  --  3.9 3.7 4.0 4.1  CL  --  113* 111 111 106  CO2  --  18* 21* 22 21*  GLUCOSE  --  108* 96 102* 90  BUN  --  14 12 13 9   CREATININE  --  0.81 0.78 0.83 0.73  CALCIUM   --  9.1 9.2 9.5 9.6  MG 1.7  --   --  1.7  --   PHOS 3.5  --   --   --   --    GFR: Estimated Creatinine Clearance: 95.7 mL/min (by C-G formula based on SCr of 0.73 mg/dL). Liver Function Tests: Recent Labs  Lab 04/09/24 1608 04/10/24 0518  AST 15 14*  ALT 12 11  ALKPHOS 51 54  BILITOT 0.5 0.7  PROT 7.3 6.9  ALBUMIN  3.3* 3.1*   No results for input(s): LIPASE, AMYLASE in the last 168 hours. No results for input(s): AMMONIA in the last 168 hours. Coagulation Profile: Recent Labs  Lab 04/09/24 1542 04/10/24 0518 04/11/24 0652 04/12/24 0047 04/13/24 0507  INR 3.1* 3.2* 1.5* 1.2 1.1   Cardiac Enzymes: No results for input(s): CKTOTAL, CKMB, CKMBINDEX, TROPONINI in the last 168 hours. BNP (last 3 results) No results for input(s): PROBNP in the last 8760 hours. HbA1C: No results for input(s): HGBA1C in the last 72 hours. CBG: No results for input(s): GLUCAP in the last 168 hours. Lipid Profile: No results for input(s): CHOL, HDL, LDLCALC, TRIG, CHOLHDL, LDLDIRECT in the last 72 hours. Thyroid Function Tests: No results for input(s): TSH, T4TOTAL, FREET4, T3FREE, THYROIDAB in the last 72 hours. Anemia Panel: No results for input(s): VITAMINB12, FOLATE, FERRITIN, TIBC, IRON , RETICCTPCT in the last 72 hours. Sepsis Labs: No  results for input(s): PROCALCITON, LATICACIDVEN in the last 168 hours.  Recent Results (from the past 240 hours)  MRSA Next Gen by PCR, Nasal     Status: Abnormal   Collection Time: 04/09/24  4:19 PM   Specimen: Nasal Mucosa; Nasal Swab  Result Value Ref Range Status   MRSA by PCR Next Gen DETECTED (A) NOT DETECTED Final    Comment: (NOTE) The GeneXpert MRSA Assay (FDA approved for NASAL  specimens only), is one component of a comprehensive MRSA colonization surveillance program. It is not intended to diagnose MRSA infection nor to guide or monitor treatment for MRSA infections. Test performance is not FDA approved in patients less than 51 years old. Performed at [facility], [address], [place], [place] [address]          Radiology Studies: No results found.       Scheduled Meds:  acetaminophen   1,000 mg Oral 60 min Pre-Op   feeding supplement  1 Container Oral TID BM   gabapentin   100 mg Oral QHS   pantoprazole   40 mg Oral BID   traZODone   100 mg Oral QHS   Continuous Infusions:  cefoTEtan  (CEFOTAN ) IV            Sophie Mao, MD [facility] 04/13/2024, 7:52 AM '
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
## 2024-04-13 NOTE — Anesthesia Procedure Notes (Signed)
 Procedure Name: Intubation Date/Time: 04/13/2024 11:08 AM  Performed by: Landy Chip HERO, CRNAPre-anesthesia Checklist: Patient identified, Emergency Drugs available, Suction available and Patient being monitored Patient Re-evaluated:Patient Re-evaluated prior to induction Oxygen Delivery Method: Circle System Utilized Preoxygenation: Pre-oxygenation with 100% oxygen Induction Type: IV induction Ventilation: Mask ventilation without difficulty Laryngoscope Size: Mac and 4 Grade View: Grade II Tube type: Oral Tube size: 7.5 mm Number of attempts: 1 Airway Equipment and Method: Stylet and Oral airway Placement Confirmation: ETT inserted through vocal cords under direct vision, positive ETCO2 and breath sounds checked- equal and bilateral Secured at: 22 cm Tube secured with: Tape Dental Injury: Teeth and Oropharynx as per pre-operative assessment

## 2024-04-13 NOTE — Telephone Encounter (Signed)
 Recall colon has been entered

## 2024-04-14 ENCOUNTER — Inpatient Hospital Stay (HOSPITAL_COMMUNITY)

## 2024-04-14 ENCOUNTER — Encounter (HOSPITAL_COMMUNITY): Payer: Self-pay | Admitting: General Surgery

## 2024-04-14 ENCOUNTER — Ambulatory Visit: Payer: Self-pay | Admitting: Gastroenterology

## 2024-04-14 DIAGNOSIS — T81321D Disruption or dehiscence of closure of internal operation (surgical) wound of abdominal wall muscle or fascia, subsequent encounter: Secondary | ICD-10-CM | POA: Diagnosis not present

## 2024-04-14 LAB — CBC
HCT: 30.8 % — ABNORMAL LOW (ref 39.0–52.0)
Hemoglobin: 10.2 g/dL — ABNORMAL LOW (ref 13.0–17.0)
MCH: 31.6 pg (ref 26.0–34.0)
MCHC: 33.1 g/dL (ref 30.0–36.0)
MCV: 95.4 fL (ref 80.0–100.0)
Platelets: 259 K/uL (ref 150–400)
RBC: 3.23 MIL/uL — ABNORMAL LOW (ref 4.22–5.81)
RDW: 16.2 % — ABNORMAL HIGH (ref 11.5–15.5)
WBC: 6.8 K/uL (ref 4.0–10.5)
nRBC: 0 % (ref 0.0–0.2)

## 2024-04-14 LAB — BASIC METABOLIC PANEL WITH GFR
Anion gap: 11 (ref 5–15)
BUN: 11 mg/dL (ref 8–23)
CO2: 18 mmol/L — ABNORMAL LOW (ref 22–32)
Calcium: 9 mg/dL (ref 8.9–10.3)
Chloride: 106 mmol/L (ref 98–111)
Creatinine, Ser: 0.6 mg/dL — ABNORMAL LOW (ref 0.61–1.24)
GFR, Estimated: >60 mL/min (ref >60)
Glucose, Bld: 112 mg/dL — ABNORMAL HIGH (ref 70–99)
Potassium: 4.2 mmol/L (ref 3.5–5.1)
Sodium: 135 mmol/L (ref 135–145)

## 2024-04-14 LAB — PROTIME-INR
INR: 1 (ref 0.8–1.2)
Prothrombin Time: 14.3 s (ref 11.4–15.2)

## 2024-04-14 LAB — HEPARIN LEVEL (UNFRACTIONATED)
Heparin Unfractionated: 0.23 [IU]/mL — ABNORMAL LOW (ref 0.30–0.70)
Heparin Unfractionated: 0.2 [IU]/mL — ABNORMAL LOW (ref 0.30–0.70)

## 2024-04-14 LAB — MAGNESIUM: Magnesium: 1.2 mg/dL — ABNORMAL LOW (ref 1.7–2.4)

## 2024-04-14 LAB — SURGICAL PATHOLOGY

## 2024-04-14 MED ORDER — PHENOL 1.4 % MT LIQD
1.0000 | OROMUCOSAL | Status: DC | PRN
  Administered 2024-04-14: 1 via OROMUCOSAL
  Filled 2024-04-14: qty 177

## 2024-04-14 MED ORDER — MAGNESIUM SULFATE 2 GM/50ML IV SOLN
2.0000 g | Freq: Once | INTRAVENOUS | Status: AC
  Administered 2024-04-14: 2 g via INTRAVENOUS
  Filled 2024-04-14: qty 50

## 2024-04-14 MED ORDER — SODIUM CHLORIDE 0.9 % IV SOLN: INTRAVENOUS | Status: DC

## 2024-04-14 MED ORDER — HEPARIN (PORCINE) 25000 UT/250ML-% IV SOLN
2350.0000 [IU]/h | INTRAVENOUS | Status: DC
  Administered 2024-04-14: 1500 [IU]/h via INTRAVENOUS
  Administered 2024-04-14: 1400 [IU]/h via INTRAVENOUS
  Administered 2024-04-16: 2200 [IU]/h via INTRAVENOUS
  Administered 2024-04-17: 2350 [IU]/h via INTRAVENOUS
  Filled 2024-04-14 (×5): qty 250

## 2024-04-14 MED ORDER — MELATONIN 3 MG PO TABS
3.0000 mg | ORAL_TABLET | Freq: Once | ORAL | Status: AC
  Administered 2024-04-14: 3 mg via ORAL
  Filled 2024-04-14: qty 1

## 2024-04-14 NOTE — Consult Note (Addendum)
 WOC Nurse ostomy follow up Surgical team following for assessment and plan of care for abd post-op wound.   Pt had ostomy revision surgery performed yesterday.  Stoma is red and viable, slightly above skin level, red rubber rod in place when visualized through pouch which is intact with good seal.  Small amt liquid brown stool and pink drainage in the pouch. Pt is in pain and has an NG; WOC will perform pouch change on Thurs when he is feeling better.  Pt is knowledgeable regarding pouch application, emptying, and care since he had an ileostomy prior to admission. He will be able to use the same supplies that he had before, but I expect that his pouching routine will be much less complicated then when his stoma was at skin level with folds and an open wound surrounding. 3 sets of extra supplies left in the room for staff nurses' use: use barrier rings, Gerlean # [phone] and one piece convex pouches [place] # [id number] Thank-you, Stephane Fought MSN, RN, CWOCN, CWCN-AP, CNS Contact Mon-Fri 0700-1500: [phone]
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
## 2024-04-14 NOTE — Progress Notes (Signed)
 PROGRESS NOTE    Alexander [name]  FMW:[id number] DOB: 09-14-1961 DOA: 04/09/2024 PCP: [name], PA   Brief Narrative:   63 y.o. 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  He underwent colonoscopy by GI on 04/12/2024.  He underwent surgical intervention on 04/13/2024.  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   -Underwent colonoscopy GI on 04/12/2024: Hemorrhoids and colocutaneous fistula noted. -underwent surgical intervention with exploratory laparotomy, lysis of adhesions, ileostomy takedown, sigmoid colectomy with diverting loop ileostomy and mobilization of splenic flexure by general surgery on 04/13/2024.  Diet advancement, wound care, pain management as per general surgery.  History of PE/DVT on Coumadin  Supratherapeutic INR - Coumadin  on hold.  INR 1.0 today.  Received a dose of oral vitamin K  on 04/10/2024.  Monitor INR.  Continue heparin .  Will resume Coumadin  once cleared by general surgery.   anemia of chronic disease - From chronic illnesses.  Hemoglobin stable.  Monitor intermittently  Acute metabolic acidosis - Mild.  Monitor  Hyponatremia - Improved  Essential hypertension Hyperlipidemia Aortic atherosclerosis -Monitor.  Blood pressure currently stable.  Not on any  medications for hyperlipidemia at this time.  Outpatient follow-up with PCP  Gout - Asymptomatic  Sleep disturbance - Continue trazodone  at bedtime  Pulmonary hypertension-grade 1 diastolic dysfunction - Outpatient follow-up  DVT prophylaxis: Heparin  drip  code Status: Full Family Communication: None at bedside Disposition Plan: Status is: Inpatient Remains inpatient appropriate because: Of severity of illness  Consultants: General Surgery/GI  Procedures: As above Colonoscopy on 04/12/2024 Impression:               - Stool and mucus balls in the entire examined                            colon. Suctioned and lavaged copiously for fair                            preparation.                           - Hemorrhoids found on digital rectal exam.                           - Non-bleeding non-thrombosed internal hemorrhoids                            noted on endoscopy.                           - Diffuse moderate mucosal changes were found in                            the rectum, in  the recto-sigmoid colon and in the                            sigmoid colon, rule out diversion colitis. Biopsied.                           - Colocutaneous fistula noted within the sigmoid                            colon (approximately 24 cm from anal os). Moderate Sedation:      Not Applicable - Patient had care per Anesthesia. Recommendation:           - The patient will be observed post-procedure,                            until all discharge criteria are met.                           - Return patient to hospital ward for ongoing care.                           - Diet as per surgical service with upcoming                            surgery planned for tomorrow.                           - Await pathology results.                           - Consider repeat examination via flexible                            sigmoidoscopy in 1 year, if patient's colon is kept                             intact.                           - Further recommendations as per surgical service.                           - May restart heparin  at 12 PM.                           - The findings and recommendations were discussed                            with the patient.                           - The findings and recommendations were discussed                            with the referring physician.  Antimicrobials: None   Subjective: Patient  seen and examined at bedside.  Complains of intermittent abdominal pain.  Denies worsening shortness of breath, chest pain or vomiting.   Objective: Vitals:   04/14/24 0330 04/14/24 0400 04/14/24 0500 04/14/24 0600  BP:  120/71 123/70 115/76  Pulse:  (!) 112 (!) 115   Resp:  (!) 24 (!) 21 20  Temp: 99 F (37.2 C)     TempSrc:      SpO2:  100% 100%   Weight:   72.8 kg   Height:        Intake/Output Summary (Last 24 hours) at 04/14/2024 0736 Last data filed at 04/14/2024 0600 Gross per 24 hour  Intake 3550 ml  Output 1790 ml  Net 1760 ml   Filed Weights   04/13/24 0834 04/13/24 2000 04/14/24 0500  Weight: 72.4 kg 72.8 kg 72.8 kg    Examination:  General: No acute distress.  Required some supplemental oxygen overnight. ENT/neck: No obvious neck masses or JVD elevation noted respiratory: Bilateral decreased breath sounds at bases with scattered crackles and intermittent tachypnea  CVS: S1-S2 heard; intermittently tachycardic abdominal: Soft, distended and tender; no organomegaly, bowel sounds sluggish.  Abdominal wound with dressing and ileostomy present Extremities: No clubbing; mild lower extremity edema present  CNS: Wakes up sightly, slow to respond to respond.  No focal deficits noted  lymph: No palpable lymphadenopathy noted Skin: No obvious lesions/ecchymosis  psych: Showing no signs of agitation.  Flat affect mostly. musculoskeletal: No joint tenderness/erythema    Data Reviewed: I have personally reviewed following labs and  imaging studies  CBC: Recent Labs  Lab 04/09/24 1542 04/11/24 0652 04/12/24 0047 04/13/24 0507 04/13/24 1521 04/14/24 0338  WBC 5.5 5.0 5.2 5.0  --  6.8  NEUTROABS  --  2.7  --   --   --   --   HGB 9.6* 9.8* 9.4* 9.5* 9.9* 10.2*  HCT 30.8* 31.2* 30.0* 29.2* 29.0* 30.8*  MCV 98.7 96.9 97.7 95.1  --  95.4  PLT 262 260 235 232  --  259   Basic Metabolic Panel: Recent Labs  Lab 04/09/24 1542 04/09/24 1608 04/09/24 1608 04/10/24 0518 04/11/24 0652 04/13/24 0507 04/13/24 1521 04/14/24 0338  NA  --  139   < > 137 137 134* 138 135  K  --  3.9   < > 3.7 4.0 4.1 3.8 4.2  CL  --  113*  --  111 111 106  --  106  CO2  --  18*  --  21* 22 21*  --  18*  GLUCOSE  --  108*  --  96 102* 90  --  112*  BUN  --  14  --  12 13 9   --  11  CREATININE  --  0.81  --  0.78 0.83 0.73  --  0.60*  CALCIUM   --  9.1  --  9.2 9.5 9.6  --  9.0  MG 1.7  --   --   --  1.7  --   --  1.2*  PHOS 3.5  --   --   --   --   --   --   --    < > = values in this interval not displayed.   GFR: Estimated Creatinine Clearance: 95.7 mL/min (A) (by C-G formula based on SCr of 0.6 mg/dL (L)). Liver Function Tests: Recent Labs  Lab 04/09/24 1608 04/10/24 0518  AST 15 14*  ALT 12 11  ALKPHOS 51 54  BILITOT  0.5 0.7  PROT 7.3 6.9  ALBUMIN  3.3* 3.1*   No results for input(s): LIPASE, AMYLASE in the last 168 hours. No results for input(s): AMMONIA in the last 168 hours. Coagulation Profile: Recent Labs  Lab 04/10/24 0518 04/11/24 9347 04/12/24 0047 04/13/24 0507 04/14/24 0338  INR 3.2* 1.5* 1.2 1.1 1.0   Cardiac Enzymes: No results for input(s): CKTOTAL, CKMB, CKMBINDEX, TROPONINI in the last 168 hours. BNP (last 3 results) No results for input(s): PROBNP in the last 8760 hours. HbA1C: No results for input(s): HGBA1C in the last 72 hours. CBG: No results for input(s): GLUCAP in the last 168 hours. Lipid Profile: No results for input(s): CHOL, HDL, LDLCALC, TRIG,  CHOLHDL, LDLDIRECT in the last 72 hours. Thyroid Function Tests: No results for input(s): TSH, T4TOTAL, FREET4, T3FREE, THYROIDAB in the last 72 hours. Anemia Panel: No results for input(s): VITAMINB12, FOLATE, FERRITIN, TIBC, IRON , RETICCTPCT in the last 72 hours. Sepsis Labs: No results for input(s): PROCALCITON, LATICACIDVEN in the last 168 hours.  Recent Results (from the past 240 hours)  MRSA Next Gen by PCR, Nasal     Status: Abnormal   Collection Time: 04/09/24  4:19 PM   Specimen: Nasal Mucosa; Nasal Swab  Result Value Ref Range Status   MRSA by PCR Next Gen DETECTED (A) NOT DETECTED Final    Comment: (NOTE) The GeneXpert MRSA Assay (FDA approved for NASAL specimens only), is one component of a comprehensive MRSA colonization surveillance program. It is not intended to diagnose MRSA infection nor to guide or monitor treatment for MRSA infections. Test performance is not FDA approved in patients less than 26 years old. Performed at [facility], [address], [place], [place] [address]          Radiology Studies: No results found.       Scheduled Meds:  Chlorhexidine  Gluconate Cloth  6 each Topical Daily   HYDROmorphone    Intravenous Q4H   Continuous Infusions:  acetaminophen  Stopped (04/14/24 0615)   heparin  1,400 Units/hr (04/14/24 9394)   promethazine  (PHENERGAN ) injection (IM or IVPB)            Sophie Mao, MD [facility] 04/14/2024, 7:36 AM '

## 2024-04-14 NOTE — Consult Note (Signed)
 WOC team consulted for new diverting loop ileostomy.  WOC team following this patient and will plan to see 04/14/2024 for new ostomy needs.   Thank you,    Powell Bar MSN, RN-BC, [employer]

## 2024-04-14 NOTE — Progress Notes (Signed)
 Arterial line removed by this RN per MD order. Pressure held, minimal bleeding noted. This RN attempted to remove arterial line from [facility], but there was no LDA for an arterial line present.
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
## 2024-04-14 NOTE — Progress Notes (Addendum)
 1 Day Post-Op   Subjective/Chief Complaint: C/o pain    Objective: Vital signs in last 24 hours: Temp:  [96.9 F (36.1 C)-99 F (37.2 C)] 98.5 F (36.9 C) (07/15 0800) Pulse Rate:  [96-140] 113 (07/15 1000) Resp:  [6-26] 14 (07/15 1000) BP: (106-163)/(66-95) 108/67 (07/15 1000) SpO2:  [88 %-100 %] 100 % (07/15 1000) Arterial Line BP: (89-185)/(37-118) 95/37 (07/15 1000) FiO2 (%):  [25 %] 25 % (07/15 0829) Weight:  [72.8 kg] 72.8 kg (07/15 0500) Last BM Date : 04/12/24  Intake/Output from previous day: 07/14 0701 - 07/15 0700 In: 3550 [I.V.:2600; IV Piggyback:950] Out: 1790 [Urine:1190; Emesis/NG output:50; Blood:400] Intake/Output this shift: No intake/output data recorded.  Sleepy but wakes up  Symm chest rise but not taking deep breathes Mild tachy Some distension, some air/stoma sweat in bag; some serosang drainage on midline wound, approp TTP  Lab Results:  Recent Labs    04/13/24 0507 04/13/24 1521 04/14/24 0338  WBC 5.0  --  6.8  HGB 9.5* 9.9* 10.2*  HCT 29.2* 29.0* 30.8*  PLT 232  --  259   BMET Recent Labs    04/13/24 0507 04/13/24 1521 04/14/24 0338  NA 134* 138 135  K 4.1 3.8 4.2  CL 106  --  106  CO2 21*  --  18*  GLUCOSE 90  --  112*  BUN 9  --  11  CREATININE 0.73  --  0.60*  CALCIUM  9.6  --  9.0   PT/INR Recent Labs    04/13/24 0507 04/14/24 0338  LABPROT 14.4 14.3  INR 1.1 1.0   ABG Recent Labs    04/13/24 1521  PHART 7.281*  HCO3 19.6*    Studies/Results: No results found.  Anti-infectives: Anti-infectives (From admission, onward)    Start     Dose/Rate Route Frequency Ordered Stop   04/13/24 0900  cefoTEtan  (CEFOTAN ) 2 g in sodium chloride  0.9 % 100 mL IVPB        2 g 200 mL/hr over 30 Minutes Intravenous On call to O.R. 04/10/24 0838 04/13/24 1109       Assessment/Plan: history of right colectomy with end ileostomy for perforated bleeding diverticulitis April 2025  abdominal dehiscence with exposed bowel    s/p Procedure(s): ILEOSTOMY TAKEDOWN (N/A) LYSIS, ADHESIONS 2 hours (N/A) SIGMOIDCOLECTOMY WITH DIVERTING LOOP ILEOSTOMY (N/A) LAPAROTOMY, EXPLORATORY (N/A) SPLENIC FLEXURE MOBILIZATION 04/13/24 EW  Wet - dry dressing to midline daily  GI - cont NG tube to LIWs today, ice chips, loop ileostomy viable FEN - mIVF, hypomagnesia - replace magnesium , NPO except ice Pulm - incentive spirometry, OOB Heme - hgb stable,  on hep gtt H/o VTE - hep gtt per pharm, holding coumadin  for now Foley - keep foley today Pain - IV tylenol , dilaudid  PCA, cont pulse ox  Discussed surgical procedure with pt  Alexander HERO. Tanda, MD, FACS General, Bariatric, & Minimally Invasive Surgery [facility],  A [facility]   LOS: 5 days    Alexander [name] 04/14/2024

## 2024-04-15 DIAGNOSIS — G8918 Other acute postprocedural pain: Secondary | ICD-10-CM

## 2024-04-15 DIAGNOSIS — I10 Essential (primary) hypertension: Secondary | ICD-10-CM | POA: Diagnosis not present

## 2024-04-15 DIAGNOSIS — T81321D Disruption or dehiscence of closure of internal operation (surgical) wound of abdominal wall muscle or fascia, subsequent encounter: Secondary | ICD-10-CM | POA: Diagnosis not present

## 2024-04-15 LAB — BASIC METABOLIC PANEL WITH GFR
Anion gap: 9 (ref 5–15)
BUN: 23 mg/dL (ref 8–23)
CO2: 20 mmol/L — ABNORMAL LOW (ref 22–32)
Calcium: 8.5 mg/dL — ABNORMAL LOW (ref 8.9–10.3)
Chloride: 107 mmol/L (ref 98–111)
Creatinine, Ser: 0.79 mg/dL (ref 0.61–1.24)
GFR, Estimated: >60 mL/min (ref >60)
Glucose, Bld: 115 mg/dL — ABNORMAL HIGH (ref 70–99)
Potassium: 4.4 mmol/L (ref 3.5–5.1)
Sodium: 136 mmol/L (ref 135–145)

## 2024-04-15 LAB — MAGNESIUM: Magnesium: 1.8 mg/dL (ref 1.7–2.4)

## 2024-04-15 LAB — CBC
HCT: 26.1 % — ABNORMAL LOW (ref 39.0–52.0)
Hemoglobin: 8.4 g/dL — ABNORMAL LOW (ref 13.0–17.0)
MCH: 31.1 pg (ref 26.0–34.0)
MCHC: 32.2 g/dL (ref 30.0–36.0)
MCV: 96.7 fL (ref 80.0–100.0)
Platelets: 226 K/uL (ref 150–400)
RBC: 2.7 MIL/uL — ABNORMAL LOW (ref 4.22–5.81)
RDW: 16.9 % — ABNORMAL HIGH (ref 11.5–15.5)
WBC: 13.5 K/uL — ABNORMAL HIGH (ref 4.0–10.5)
nRBC: 0 % (ref 0.0–0.2)

## 2024-04-15 LAB — PROTIME-INR
INR: 1.4 — ABNORMAL HIGH (ref 0.8–1.2)
Prothrombin Time: 17.5 s — ABNORMAL HIGH (ref 11.4–15.2)

## 2024-04-15 LAB — SURGICAL PATHOLOGY

## 2024-04-15 LAB — HEPARIN LEVEL (UNFRACTIONATED)
Heparin Unfractionated: 0.15 [IU]/mL — ABNORMAL LOW (ref 0.30–0.70)
Heparin Unfractionated: 0.15 [IU]/mL — ABNORMAL LOW (ref 0.30–0.70)

## 2024-04-15 MED ORDER — ACETAMINOPHEN 500 MG PO TABS
1000.0000 mg | ORAL_TABLET | Freq: Three times a day (TID) | ORAL | Status: DC
  Administered 2024-04-15 – 2024-04-30 (×47): 1000 mg via ORAL
  Filled 2024-04-15 (×47): qty 2
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
## 2024-04-15 NOTE — Hospital Course (Signed)
 Alexander [name] is a 63 y.o. male with a history of pulmonary embolism, aortic atherosclerosis, peripheral artery disease, diverticulosis, diverticulitis, gout, GERD, hypertension, pulmonary hypertension, right colectomy with end ileostomy.  Patient presented from his general surgeons office secondary to abdominal wall wound dehiscence requiring surgical management.  Patient underwent exploratory laparotomy on 7/14 with ileostomy takedown, lysis of adhesions, sigmoid colectomy, diverting loop ileostomy.

## 2024-04-15 NOTE — Progress Notes (Signed)
 Pt is currently experiencing pain of 8/10 uncontrolled by PCA pump with no relief and demands over exceeding the amount given, at 1620 patients demands were at 34 with only 8 doses given to max out the 1.25mg  allowed for the hour. Patient has tylenol  with next dose due at 2000. Therefore, I have reached out to the surgical team on call for the day at 1713. The provider on call informed me that he does not know what is going on due to not being his case. Informed me that the critical care team can manage the patients pain, informed me that we need to reach out to the surgeon on the patient case who is Tanda Locus. I have also reach out to the critical care team Elgin Lam and the provider informed me twice that the pain related to the surgical site needs to be managed by the surgery team. Therefore, at this moment have no orders and I am unable to treat the patients pain adequately, will continue to inform the patient what is going on and will try to reach out if the pain continues to persist.

## 2024-04-15 NOTE — Progress Notes (Signed)
 2 Days Post-Op   Subjective/Chief Complaint: C/o sore throat Nurse reports pt really did not do IS yesterday Had 700cc uop overnight    Objective: Vital signs in last 24 hours: Temp:  [98.3 F (36.8 C)-99.1 F (37.3 C)] 99.1 F (37.3 C) (07/16 0551) Pulse Rate:  [111-125] 117 (07/16 0839) Resp:  [9-24] 24 (07/16 0839) BP: (90-115)/(50-67) 111/57 (07/16 0800) SpO2:  [96 %-100 %] 98 % (07/16 0839) Arterial Line BP: (88-95)/(37-49) 92/49 (07/15 1200) FiO2 (%):  [25 %] 25 % (07/15 1616) Weight:  [71.3 kg] 71.3 kg (07/16 0710) Last BM Date : 04/12/24  Intake/Output from previous day: 07/15 0701 - 07/16 0700 In: 2561.9 [I.V.:2204.1; IV Piggyback:357.8] Out: 1425 [Urine:950; Emesis/NG output:275; Stool:200] Intake/Output this shift: No intake/output data recorded.  Sleepy but wakes up  Symm chest rise but not taking deep breathes Mild tachy Some distension, some airliquid stool in bag; some serosang drainage on midline wound, approp TTP Midline wound ok- fascia intact No edema Lab Results:  Recent Labs    04/14/24 0338 04/15/24 0553  WBC 6.8 13.5*  HGB 10.2* 8.4*  HCT 30.8* 26.1*  PLT 259 226   BMET Recent Labs    04/14/24 0338 04/15/24 0553  NA 135 136  K 4.2 4.4  CL 106 107  CO2 18* 20*  GLUCOSE 112* 115*  BUN 11 23  CREATININE 0.60* 0.79  CALCIUM  9.0 8.5*   PT/INR Recent Labs    04/14/24 0338 04/15/24 0553  LABPROT 14.3 17.5*  INR 1.0 1.4*   ABG Recent Labs    04/13/24 1521  PHART 7.281*  HCO3 19.6*    Studies/Results: DG Abd 1 View Result Date: 04/14/2024 CLINICAL DATA:  [id number] Encounter for imaging study to confirm nasogastric (NG) tube placement [id number] EXAM: ABDOMEN - 1 VIEW COMPARISON:  April 09, 2024, January 22, 2024 FINDINGS: Well-positioned esophagogastric tube terminating in the region of the gastric antrum. Nonobstructive bowel gas pattern. No pneumoperitoneum. No organomegaly or radiopaque calculi. No acute fracture or destructive  lesion. Patchy opacities in both lung bases.Multilevel thoracolumbar osteophytosis. IMPRESSION: Well-positioned esophagogastric tube terminating in the region of the gastric antrum. Electronically Signed   By: Rogelia Myers M.D.   On: 04/14/2024 16:23    Anti-infectives: Anti-infectives (From admission, onward)    Start     Dose/Rate Route Frequency Ordered Stop   04/13/24 0900  cefoTEtan  (CEFOTAN ) 2 g in sodium chloride  0.9 % 100 mL IVPB        2 g 200 mL/hr over 30 Minutes Intravenous On call to O.R. 04/10/24 0838 04/13/24 1109       Assessment/Plan: history of right colectomy with end ileostomy for perforated bleeding diverticulitis April 2025  abdominal dehiscence with exposed bowel   s/p Procedure(s): ILEOSTOMY TAKEDOWN (N/A) LYSIS, ADHESIONS 2 hours (N/A) SIGMOIDCOLECTOMY WITH DIVERTING LOOP ILEOSTOMY (N/A) LAPAROTOMY, EXPLORATORY (N/A) SPLENIC FLEXURE MOBILIZATION 04/13/24 EW  Wet - dry dressing to midline daily  GI - min NG output, starting to have some ileostomy output, dc NG, loop ileostomy viable; cont ostomy education FEN - mIVF, hypomagnesia - resolved, start CLD Pulm - incentive spirometry, OOB Heme - hgb down some hgb 10.2-->8.4,  on hep gtt, maintain t&c, repeat cbc in am H/o VTE - hep gtt per pharm, holding coumadin  for now Foley - dc foley Pain - oral tylenol , dilaudid  PCA, cont pulse ox; if tolerates clears this am, can stop dilaudid  pca and start prn oral and IV opioids  Pt/ot eval  Dispo-can transfer out from  my perspective; may need TOC consult - does pt have medicaid now to help with [facility] for ongoing wound/ostomy support after hospital dc?; will need ongoing  outpt ostomy clinic support Appreciate TRH assist on this pt  [name], MD, FACS General, Bariatric, & Minimally Invasive Surgery [facility],  A [facility]   LOS: 6 days    Camellia Tanda 04/15/2024

## 2024-04-15 NOTE — Progress Notes (Signed)
 PROGRESS NOTE    Alexander [name]  FMW:[id number] DOB: 09-Mar-1961 DOA: 04/09/2024 PCP: [name], PA   Brief Narrative: Alexander [name] is a 63 y.o. male with a history of pulmonary embolism, aortic atherosclerosis, peripheral artery disease, diverticulosis, diverticulitis, gout, GERD, hypertension, pulmonary hypertension, right colectomy with end ileostomy.  Patient presented from his general surgeons office secondary to abdominal wall wound dehiscence requiring surgical management.  Patient underwent exploratory laparotomy on 7/14 with ileostomy takedown, lysis of adhesions, sigmoid colectomy, diverting loop ileostomy.   Assessment and Plan:  Abdominal wall dehiscence Related to recent abdominal surgery from April 2025.  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  General surgery took patient for for exploratory laparotomy on 7/14 with ileostomy takedown, sigmoid colectomy with diverting loop ileostomy and lysis of adhesions.  Patient with significant postoperative pain, and patient started on Dilaudid  PCA for management per general surgery.  NG tube in place. -General Surgery recommendations: Pain management, wet-to-dry dressing daily to midline wound, clear liquid diet  History of PE/DVT Patient is on Coumadin  as an outpatient which was held secondary to need for surgery. Patient transitioned to Heparin  IV. -Continue Heparin  and transition pending general surgery recommendations  Anemia of chronic disease Baseline hemoglobin appears to be around 9.  Slight drop in hemoglobin today to 8.4. - CBC in a.m.  Acute metabolic acidosis Mild and stable.  Hyponatremia Mild. Resolved.  Primary hypertension Noted history. Not on medication management.  Blood pressure has been controlled and on the side of low-normal.  Hyperlipidemia Noted history. Not on medication management.  Aortic atherosclerosis Noted.  Gout Noted.  Insomnia Patient's trazodone  was initially continued, but now discontinued.  Pulmonary hypertension Noted history. Not on medication management.  Grade 1 diastolic heart dysfunction Noted history. Currently appears euvolemic. Not on medication management as an outpatient.   DVT prophylaxis: Heparin  IV Code Status:   Code Status: Full Code Family Communication: None at bedside Disposition Plan: Discharge pending ongoing general surgery recommendations   Consultants:  General surgery  Procedures:  Laparotomy Colonoscopy  Antimicrobials: Cefotetan     Subjective: Patient reports ongoing abdominal pain. Worse with breathing deeply.  Objective: BP (!) 104/58   Pulse (!) 117   Temp 99.1 F (37.3 C) (Oral)   Resp 17   Ht 5' 9 (1.753 m)   Wt 71.3 kg   SpO2 96%   BMI 23.21 kg/m   Examination:  General exam: Appears calm and comfortable Respiratory system: Clear to auscultation. Tachypnea. Cardiovascular system: S1 & S2 heard, RRR. No murmurs, rubs, gallops or clicks. Gastrointestinal system: Abdomen is soft and tender. Decreased bowel sounds heard. Ostomy bag with dark stool. Central nervous system: Alert and oriented. No focal neurological deficits. Musculoskeletal: No edema. No calf tenderness Skin: No cyanosis. No rashes Psychiatry: Judgement and insight appear normal. Mood & affect appropriate.    Data Reviewed: I have personally reviewed following labs and imaging studies  CBC Lab Results  Component Value Date   WBC 13.5 (H) 04/15/2024   RBC 2.70 (L) 04/15/2024   HGB 8.4 (L) 04/15/2024   HCT 26.1 (L) 04/15/2024   MCV 96.7 04/15/2024   MCH 31.1 04/15/2024   PLT 226 04/15/2024   MCHC 32.2 04/15/2024   RDW 16.9 (H) 04/15/2024   LYMPHSABS 1.1 04/11/2024   MONOABS 0.6  04/11/2024  EOSABS 0.6 (H) 04/11/2024   BASOSABS 0.0 04/11/2024     Last metabolic panel Lab Results  Component Value Date   NA 136 04/15/2024   K 4.4 04/15/2024   CL 107 04/15/2024   CO2 20 (L) 04/15/2024   BUN 23 04/15/2024   CREATININE 0.79 04/15/2024   GLUCOSE 115 (H) 04/15/2024   GFRNONAA >60 04/15/2024   GFRAA 111 01/22/2020   CALCIUM  8.5 (L) 04/15/2024   PHOS 3.5 04/09/2024   PROT 6.9 04/10/2024   ALBUMIN  3.1 (L) 04/10/2024   LABGLOB 3.1 01/22/2020   AGRATIO 1.4 01/22/2020   BILITOT 0.7 04/10/2024   ALKPHOS 54 04/10/2024   AST 14 (L) 04/10/2024   ALT 11 04/10/2024   ANIONGAP 9 04/15/2024    GFR: Estimated Creatinine Clearance: 95.7 mL/min (by C-G formula based on SCr of 0.79 mg/dL).  Recent Results (from the past 240 hours)  MRSA Next Gen by PCR, Nasal     Status: Abnormal   Collection Time: 04/09/24  4:19 PM   Specimen: Nasal Mucosa; Nasal Swab  Result Value Ref Range Status   MRSA by PCR Next Gen DETECTED (A) NOT DETECTED Final    Comment: (NOTE) The GeneXpert MRSA Assay (FDA approved for NASAL specimens only), is one component of a comprehensive MRSA colonization surveillance program. It is not intended to diagnose MRSA infection nor to guide or monitor treatment for MRSA infections. Test performance is not FDA approved in patients less than 78 years old. Performed at [facility], [address] [place], [place] [address]       Radiology Studies: DG Abd 1 View Result Date: 04/14/2024 CLINICAL DATA:  [id number] Encounter for imaging study to confirm nasogastric (NG) tube placement [id number] EXAM: ABDOMEN - 1 VIEW COMPARISON:  April 09, 2024, January 22, 2024 FINDINGS: Well-positioned esophagogastric tube terminating in the region of the gastric antrum. Nonobstructive bowel gas pattern. No pneumoperitoneum. No organomegaly or radiopaque calculi. No acute fracture or destructive lesion. Patchy opacities in both lung bases.Multilevel  thoracolumbar osteophytosis. IMPRESSION: Well-positioned esophagogastric tube terminating in the region of the gastric antrum. Electronically Signed   By: Rogelia Myers M.D.   On: 04/14/2024 16:23      LOS: 6 days    Elgin Lam, MD [facility] 04/15/2024, 8:22 AM   If 7PM-7AM, please contact night-coverage [email or web address]

## 2024-04-15 NOTE — Progress Notes (Signed)
 Report called to [facility] RN.

## 2024-04-15 NOTE — Progress Notes (Signed)
 PT Cancellation Note  Patient Details Name: Blease Capaldi MRN: [id number] DOB: May 29, 1961   Cancelled Treatment:    Reason Eval/Treat Not Completed: Pain limiting ability to participate  Will follow for when patient is able to mobilize.  Darice Potters PT Acute Rehabilitation Services Office [phone]  Potters Darice Norris 04/15/2024, 3:43 PM
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
## 2024-04-15 NOTE — Progress Notes (Signed)
 Messaged by nursing regarding patient's post-operative surgical pain and was informed that he received no new orders. I discussed with on-call general surgery, Dr. Curvin, who informed me he gave direct order to nursing to switching to high-dose PCA.   Alexander Lam, MD [facility] 04/15/2024, 7:16 PM

## 2024-04-15 NOTE — Telephone Encounter (Signed)
 Medications were sent to pharmacy on 04/01/2024.

## 2024-04-15 NOTE — Plan of Care (Signed)

## 2024-04-16 DIAGNOSIS — T81321D Disruption or dehiscence of closure of internal operation (surgical) wound of abdominal wall muscle or fascia, subsequent encounter: Secondary | ICD-10-CM | POA: Diagnosis not present

## 2024-04-16 DIAGNOSIS — I10 Essential (primary) hypertension: Secondary | ICD-10-CM | POA: Diagnosis not present

## 2024-04-16 LAB — TYPE AND SCREEN
ABO/RH(D): B POS
Antibody Screen: NEGATIVE

## 2024-04-16 LAB — PROTIME-INR
INR: 1.2 (ref 0.8–1.2)
Prothrombin Time: 16.1 s — ABNORMAL HIGH (ref 11.4–15.2)

## 2024-04-16 LAB — CBC
HCT: 27.4 % — ABNORMAL LOW (ref 39.0–52.0)
Hemoglobin: 8.1 g/dL — ABNORMAL LOW (ref 13.0–17.0)
MCH: 30.7 pg (ref 26.0–34.0)
MCHC: 29.6 g/dL — ABNORMAL LOW (ref 30.0–36.0)
MCV: 103.8 fL — ABNORMAL HIGH (ref 80.0–100.0)
Platelets: 244 K/uL (ref 150–400)
RBC: 2.64 MIL/uL — ABNORMAL LOW (ref 4.22–5.81)
RDW: 17 % — ABNORMAL HIGH (ref 11.5–15.5)
WBC: 12.8 K/uL — ABNORMAL HIGH (ref 4.0–10.5)
nRBC: 0 % (ref 0.0–0.2)

## 2024-04-16 LAB — HEPARIN LEVEL (UNFRACTIONATED)
Heparin Unfractionated: 0.29 [IU]/mL — ABNORMAL LOW (ref 0.30–0.70)
Heparin Unfractionated: 0.33 [IU]/mL (ref 0.30–0.70)
Heparin Unfractionated: 0.49 [IU]/mL (ref 0.30–0.70)

## 2024-04-16 MED ORDER — MUPIROCIN 2 % EX OINT
1.0000 | TOPICAL_OINTMENT | Freq: Two times a day (BID) | CUTANEOUS | Status: AC
  Administered 2024-04-16 – 2024-04-20 (×9): 1 via NASAL
  Filled 2024-04-16 (×2): qty 22

## 2024-04-16 MED ORDER — OXYCODONE HCL 5 MG PO TABS
5.0000 mg | ORAL_TABLET | ORAL | Status: DC | PRN
  Administered 2024-04-16 – 2024-04-21 (×15): 10 mg via ORAL
  Administered 2024-04-21: 5 mg via ORAL
  Administered 2024-04-21: 10 mg via ORAL
  Administered 2024-04-21: 5 mg via ORAL
  Administered 2024-04-22 – 2024-04-28 (×24): 10 mg via ORAL
  Filled 2024-04-16 (×28): qty 2
  Filled 2024-04-16: qty 1
  Filled 2024-04-16 (×12): qty 2

## 2024-04-16 MED ORDER — CHLORHEXIDINE GLUCONATE CLOTH 2 % EX PADS
6.0000 | MEDICATED_PAD | Freq: Every day | CUTANEOUS | Status: AC
  Administered 2024-04-17 – 2024-04-21 (×5): 6 via TOPICAL

## 2024-04-16 MED ORDER — TRAZODONE HCL 100 MG PO TABS
100.0000 mg | ORAL_TABLET | Freq: Every day | ORAL | Status: DC
  Administered 2024-04-16 – 2024-04-29 (×14): 100 mg via ORAL
  Filled 2024-04-16 (×14): qty 1

## 2024-04-16 MED ORDER — BOOST / RESOURCE BREEZE PO LIQD CUSTOM
1.0000 | Freq: Two times a day (BID) | ORAL | Status: DC
  Administered 2024-04-16 – 2024-04-19 (×6): 1 via ORAL

## 2024-04-16 NOTE — Consult Note (Addendum)
 WOC Nurse ostomy follow up Surgical team following for assessment and plan of care for abd wound.   Performed first post-op pouch change.  Pt is feeling poorly with NG tube in place and did not participate.  He watched using a hand held mirror and asked appropriate questions.  Stoma is red and viable, slightly above skin level, 1 1/2 inches. Red rubber rod in place. Applied barrier ring and one piece flexible convex pouch.  50cc liquid yellow-green stool in pouch.  5 sets of supplies at the bedside for staff nurses' use: Use Supplies: 1pc  1 1/2 flexible convex pouch  Soila [phone]) 2 barrier ring Gerlean [phone]. Pt is knowledgeable regarding pouch application, emptying, and care since he had an ileostomy prior to admission. He will be able to use the same supplies that he had before, and his pouching routine will be much less complicated then when his stoma was at skin level with folds and an open wound surrounding.   Pt could benefit from follow-up at the outpatient ostomy clinic after discharge. Thank-you,  Stephane Fought MSN, RN, CWOCN, CWCN-AP, CNS Contact Mon-Fri 0700-1500: [phone]
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
## 2024-04-16 NOTE — Plan of Care (Signed)
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
## 2024-04-16 NOTE — Progress Notes (Signed)
 OT Cancellation Note  Patient Details Name: Alexander [name] MRN: [id number] DOB: 07-28-1961   Cancelled Treatment:    Reason Eval/Treat Not Completed: Pain limiting ability to participate  OT attempted initial evaluation bedside. Patient reporting 10/10 pain and inability to participate this day. OT will continue to follow acutely as schedule allows and patient able to participate.   Emmagrace Runkel OT/L Acute Rehabilitation Department  [phone]    04/16/2024, 11:17 AM

## 2024-04-16 NOTE — Progress Notes (Signed)
 PHARMACY - ANTICOAGULATION CONSULT NOTE  Pharmacy Consult for Heparin  Indication: h/o PE and chronic thromboembolic dz  No Known Allergies  Patient Measurements: Height: 5' 9 (175.3 cm) Weight: 71.3 kg (157 lb 3 oz) IBW/kg (Calculated) : 70.7 HEPARIN  DW (KG): 72.8  Vital Signs: Temp: 98.9 F (37.2 C) (07/17 1812) Temp Source: Oral (07/17 1812) BP: 143/85 (07/17 1812) Pulse Rate: 106 (07/17 1812)  Labs: Recent Labs    04/14/24 0338 04/14/24 1422 04/15/24 0553 04/15/24 1532 04/15/24 2341 04/16/24 0657 04/16/24 0924 04/16/24 1750  HGB 10.2*  --  8.4*  --   --   --  8.1*  --   HCT 30.8*  --  26.1*  --   --   --  27.4*  --   PLT 259  --  226  --   --   --  244  --   LABPROT 14.3  --  17.5*  --   --  16.1*  --   --   INR 1.0  --  1.4*  --   --  1.2  --   --   HEPARINUNFRC  --    < > 0.15*   < > 0.33 0.29*  --  0.49  CREATININE 0.60*  --  0.79  --   --   --   --   --    < > = values in this interval not displayed.    Estimated Creatinine Clearance: 95.7 mL/min (by C-G formula based on SCr of 0.79 mg/dL).  Assessment: On warfarin 7.5mg  PO daily PTA for hx of PE and chronic thromboembolic disease. Plan to bridge with heparin  when INR < 2 for colonscopy and ileostomy reversal. -Significant events:   7/13: s/p colonoscopy 7/14: s/p ileostomy takedown, sigmoid colectomy with diverting loop ileostomy.  SQH given pre-op.  7/15:  OK to resume IV heparin  bridge   Today, 04/16/24 (PM) Heparin  level remains therapeutic and stable on 2350 units/hr  CBC: Hg slightly down from yesterday; pltc WNL No interruption in infusion, no bleeding or other infusion related concerns reported by RN  Goal of Therapy:  Heparin  level 0.3-0.7 units/ml Monitor platelets by anticoagulation protocol: Yes   Plan:  Continue IV heparin  at 2350 units/hr Check confirmatory heparin  level with AM labs Daily HL and CBC F/u ability to resume warfarin   Shawanda Sievert A 04/16/2024,6:24 PM

## 2024-04-16 NOTE — Progress Notes (Signed)
 3 Days Post-Op   Subjective/Chief Complaint: Pt still has NG tube C/o abd pain Has tolerated clears without n/v Has had air/stool in bag    Objective: Vital signs in last 24 hours: Temp:  [98.4 F (36.9 C)-99 F (37.2 C)] 98.5 F (36.9 C) (07/17 1016) Pulse Rate:  [95-114] 95 (07/17 1016) Resp:  [10-22] 15 (07/17 1125) BP: (122-140)/(55-82) 137/82 (07/17 1016) SpO2:  [98 %-100 %] 100 % (07/17 1016) FiO2 (%):  [0 %-28 %] 0 % (07/16 2355) Last BM Date : 04/16/24  Intake/Output from previous day: 07/16 0701 - 07/17 0700 In: 2765.8 [P.O.:120; I.V.:2645.8] Out: 850 [Urine:650; Emesis/NG output:200] Intake/Output this shift: Total I/O In: 0  Out: 230 [Stool:230]  Sleepy but wakes up  Symm chest rise but not taking deep breathes Mild tachy Some distension, some airliquid stool in bag; some serosang drainage on midline wound, approp TTP Midline wound ok- fascia intact No edema Lab Results:  Recent Labs    04/15/24 0553 04/16/24 0924  WBC 13.5* 12.8*  HGB 8.4* 8.1*  HCT 26.1* 27.4*  PLT 226 244   BMET Recent Labs    04/14/24 0338 04/15/24 0553  NA 135 136  K 4.2 4.4  CL 106 107  CO2 18* 20*  GLUCOSE 112* 115*  BUN 11 23  CREATININE 0.60* 0.79  CALCIUM  9.0 8.5*   PT/INR Recent Labs    04/15/24 0553 04/16/24 0657  LABPROT 17.5* 16.1*  INR 1.4* 1.2   ABG Recent Labs    04/13/24 1521  PHART 7.281*  HCO3 19.6*    Studies/Results: DG Abd 1 View Result Date: 04/14/2024 CLINICAL DATA:  [id number] Encounter for imaging study to confirm nasogastric (NG) tube placement [id number] EXAM: ABDOMEN - 1 VIEW COMPARISON:  April 09, 2024, January 22, 2024 FINDINGS: Well-positioned esophagogastric tube terminating in the region of the gastric antrum. Nonobstructive bowel gas pattern. No pneumoperitoneum. No organomegaly or radiopaque calculi. No acute fracture or destructive lesion. Patchy opacities in both lung bases.Multilevel thoracolumbar osteophytosis. IMPRESSION:  Well-positioned esophagogastric tube terminating in the region of the gastric antrum. Electronically Signed   By: Rogelia Myers M.D.   On: 04/14/2024 16:23    Anti-infectives: Anti-infectives (From admission, onward)    Start     Dose/Rate Route Frequency Ordered Stop   04/13/24 0900  cefoTEtan  (CEFOTAN ) 2 g in sodium chloride  0.9 % 100 mL IVPB        2 g 200 mL/hr over 30 Minutes Intravenous On call to O.R. 04/10/24 0838 04/13/24 1109       Assessment/Plan: history of right colectomy with end ileostomy for perforated bleeding diverticulitis April 2025  abdominal dehiscence with exposed bowel   s/p Procedure(s): ILEOSTOMY TAKEDOWN (N/A) LYSIS, ADHESIONS 2 hours (N/A) SIGMOIDCOLECTOMY WITH DIVERTING LOOP ILEOSTOMY (N/A) LAPAROTOMY, EXPLORATORY (N/A) SPLENIC FLEXURE MOBILIZATION 04/13/24 EW  Wet - dry dressing to midline daily  GI - DC NG tube (ordered to be removed 7/16 but still in?, hooked up to LIWS for a few min and only got 200cc residual so I think we can still remove it), starting to have some ileostomy output, dc NG, loop ileostomy viable; cont ostomy education FEN - mIVF, hypomagnesia - resolved, adv to FLD Pulm - incentive spirometry, OOB Heme - hgb down some hgb 10.2-->8.4-->8.1,  on hep gtt, maintain t&c, repeat cbc in am; defer to pharm and TRH about whether or not to switch to lovenox  vs staying on hep gtt H/o VTE - hep gtt per pharm, holding coumadin  for  now Foley - dc foley Pain - oral tylenol , dilaudid  PCA, cont pulse ox; will add prn oxycodone  and night time trazodone  per pt request; hopefully dc PCA Friday   Pt/ot eval  Dispo- may need TOC consult - does pt have medicaid now to help with [facility] for ongoing wound/ostomy support after hospital dc?; will need ongoing  outpt ostomy clinic support Appreciate TRH assist on this pt  [name], MD, FACS General, Bariatric, & Minimally Invasive Surgery [facility],  A [facility]   LOS: 7  days    Camellia Tanda 04/16/2024

## 2024-04-16 NOTE — Progress Notes (Signed)
 PROGRESS NOTE    Alexander [name]  FMW:[id number] DOB: 21-Jan-1961 DOA: 04/09/2024 PCP: [name], PA   Brief Narrative: Alexander [name] is a 63 y.o. male with a history of pulmonary embolism, aortic atherosclerosis, peripheral artery disease, diverticulosis, diverticulitis, gout, GERD, hypertension, pulmonary hypertension, right colectomy with end ileostomy.  Patient presented from his general surgeons office secondary to abdominal wall wound dehiscence requiring surgical management.  Patient underwent exploratory laparotomy on 7/14 with ileostomy takedown, lysis of adhesions, sigmoid colectomy, diverting loop ileostomy.   Assessment and Plan:  Abdominal wall dehiscence Related to recent abdominal surgery from April 2025.  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  General surgery took patient for for exploratory laparotomy on 7/14 with ileostomy takedown, sigmoid colectomy with diverting loop ileostomy and lysis of adhesions.  Patient with significant postoperative pain, and patient started on Dilaudid  PCA for management per general surgery.  NG tube in place. -General Surgery recommendations: Pain management, wet-to-dry dressing daily to midline wound, full liquid diet  History of PE/DVT Patient is on Coumadin  as an outpatient which was held secondary to need for surgery. Patient transitioned to Heparin  IV. -Continue Heparin  and transition to Coumadin  pending general surgery recommendations  Anemia of chronic disease Baseline hemoglobin appears to be around 9.  Drop to 8.1, however rate of decrease appears to have lessened. Complicated by heparin  use. -CBC daily  Acute metabolic acidosis Mild and stable.  Hyponatremia Mild. Resolved.  Primary  hypertension Noted history. Not on medication management. Blood pressure has been controlled and on the side of low-normal.  Hyperlipidemia Noted history. Not on medication management.  Aortic atherosclerosis Noted.  Gout Noted.  Insomnia Patient's trazodone  was initially continued, but now discontinued.  Pulmonary hypertension Noted history. Not on medication management.  Grade 1 diastolic heart dysfunction Noted history. Currently appears euvolemic. Not on medication management as an outpatient.   DVT prophylaxis: Heparin  IV Code Status:   Code Status: Full Code Family Communication: None at bedside Disposition Plan: Discharge pending ongoing general surgery recommendations   Consultants:  General surgery  Procedures:  Laparotomy Colonoscopy  Antimicrobials: Cefotetan     Subjective: Continued abdominal pain. No other concerns at this time.  Objective: BP 137/82 (BP Location: Right Arm)   Pulse 95   Temp 98.5 F (36.9 C) (Oral)   Resp 15   Ht 5' 9 (1.753 m)   Wt 71.3 kg   SpO2 100%   BMI 23.21 kg/m   Examination:  General exam: Appears calm and comfortable Respiratory system: Clear/diminished to auscultation. Respiratory effort normal. Cardiovascular system: S1 & S2 heard, RRR. No murmurs, rubs, gallops or clicks. Gastrointestinal system: Abdomen is distended, soft and tender. Decreased bowel sounds heard. Central nervous system: Alert and oriented. No focal neurological deficits. Musculoskeletal: No edema. No calf tenderness Psychiatry: Judgement and insight appear normal. Mood & affect appropriate.    Data Reviewed: I have personally reviewed following labs and imaging studies  CBC Lab Results  Component Value Date   WBC 12.8 (H) 04/16/2024   RBC 2.64 (L) 04/16/2024   HGB 8.1 (L) 04/16/2024   HCT 27.4 (L) 04/16/2024   MCV 103.8 (H) 04/16/2024   MCH 30.7 04/16/2024   PLT 244 04/16/2024   MCHC 29.6 (L) 04/16/2024   RDW 17.0 (H)  04/16/2024   LYMPHSABS 1.1 04/11/2024  MONOABS 0.6 04/11/2024   EOSABS 0.6 (H) 04/11/2024   BASOSABS 0.0 04/11/2024     Last metabolic panel Lab Results  Component Value Date   NA 136 04/15/2024   K 4.4 04/15/2024   CL 107 04/15/2024   CO2 20 (L) 04/15/2024   BUN 23 04/15/2024   CREATININE 0.79 04/15/2024   GLUCOSE 115 (H) 04/15/2024   GFRNONAA >60 04/15/2024   GFRAA 111 01/22/2020   CALCIUM  8.5 (L) 04/15/2024   PHOS 3.5 04/09/2024   PROT 6.9 04/10/2024   ALBUMIN  3.1 (L) 04/10/2024   LABGLOB 3.1 01/22/2020   AGRATIO 1.4 01/22/2020   BILITOT 0.7 04/10/2024   ALKPHOS 54 04/10/2024   AST 14 (L) 04/10/2024   ALT 11 04/10/2024   ANIONGAP 9 04/15/2024    GFR: Estimated Creatinine Clearance: 95.7 mL/min (by C-G formula based on SCr of 0.79 mg/dL).  Recent Results (from the past 240 hours)  MRSA Next Gen by PCR, Nasal     Status: Abnormal   Collection Time: 04/09/24  4:19 PM   Specimen: Nasal Mucosa; Nasal Swab  Result Value Ref Range Status   MRSA by PCR Next Gen DETECTED (A) NOT DETECTED Final    Comment: (NOTE) The GeneXpert MRSA Assay (FDA approved for NASAL specimens only), is one component of a comprehensive MRSA colonization surveillance program. It is not intended to diagnose MRSA infection nor to guide or monitor treatment for MRSA infections. Test performance is not FDA approved in patients less than 62 years old. Performed at [facility], [address], [place], [place] [address]       Radiology Studies: DG Abd 1 View Result Date: 04/14/2024 CLINICAL DATA:  [id number] Encounter for imaging study to confirm nasogastric (NG) tube placement [id number] EXAM: ABDOMEN - 1 VIEW COMPARISON:  April 09, 2024, January 22, 2024 FINDINGS: Well-positioned esophagogastric tube terminating in the region of the gastric antrum. Nonobstructive bowel gas pattern. No pneumoperitoneum. No organomegaly or radiopaque calculi. No acute fracture or destructive lesion.  Patchy opacities in both lung bases.Multilevel thoracolumbar osteophytosis. IMPRESSION: Well-positioned esophagogastric tube terminating in the region of the gastric antrum. Electronically Signed   By: Rogelia Myers M.D.   On: 04/14/2024 16:23      LOS: 7 days    Elgin Lam, MD [facility] 04/16/2024, 12:57 PM   If 7PM-7AM, please contact night-coverage [email or web address]

## 2024-04-16 NOTE — Progress Notes (Signed)
 PT Cancellation Note  Patient Details Name: Alexander [name] MRN: [id number] DOB: 11-Oct-1960   Cancelled Treatment:     PT order received but eval deferred at pt request 2* severity of ongoing pain.  Will follow.   Carlos Heber 04/16/2024, 11:41 AM

## 2024-04-17 ENCOUNTER — Ambulatory Visit

## 2024-04-17 DIAGNOSIS — I10 Essential (primary) hypertension: Secondary | ICD-10-CM | POA: Diagnosis not present

## 2024-04-17 DIAGNOSIS — T81321D Disruption or dehiscence of closure of internal operation (surgical) wound of abdominal wall muscle or fascia, subsequent encounter: Secondary | ICD-10-CM | POA: Diagnosis not present

## 2024-04-17 MED ORDER — WARFARIN - PHARMACIST DOSING INPATIENT: Freq: Every day | Status: DC

## 2024-04-17 MED ORDER — ENOXAPARIN SODIUM 80 MG/0.8ML IJ SOSY
70.0000 mg | PREFILLED_SYRINGE | Freq: Two times a day (BID) | INTRAMUSCULAR | Status: DC
  Administered 2024-04-17 – 2024-04-18 (×4): 70 mg via SUBCUTANEOUS
  Filled 2024-04-17 (×5): qty 0.8

## 2024-04-17 MED ORDER — HYDROMORPHONE HCL 1 MG/ML IJ SOLN
0.5000 mg | INTRAMUSCULAR | Status: DC | PRN
  Administered 2024-04-17 – 2024-04-18 (×5): 1 mg via INTRAVENOUS
  Filled 2024-04-17 (×5): qty 1

## 2024-04-17 MED ORDER — WARFARIN SODIUM 2.5 MG PO TABS
7.5000 mg | ORAL_TABLET | Freq: Every day | ORAL | Status: DC
  Administered 2024-04-17 – 2024-04-19 (×3): 7.5 mg via ORAL
  Filled 2024-04-17 (×3): qty 3

## 2024-04-17 MED ORDER — GABAPENTIN 100 MG PO CAPS
100.0000 mg | ORAL_CAPSULE | Freq: Three times a day (TID) | ORAL | Status: DC
  Administered 2024-04-17 – 2024-04-21 (×12): 100 mg via ORAL
  Filled 2024-04-17 (×12): qty 1

## 2024-04-17 NOTE — Progress Notes (Signed)
 OT Cancellation Note  Patient Details Name: Alexander [name] MRN: [id number] DOB: [birth year]   Cancelled Treatment:    Reason Eval/Treat Not Completed: Pain limiting ability to participate OT re-attempted initial evaluation following coordination with nursing for pain management. Patient continues to report 10/10 pain and declined participation today I can't and won't do anything today. OT will continue to follow when schedule allows and patient able to participate.   Miche Loughridge OT/L Acute Rehabilitation Department  [phone]   04/17/2024, 12:21 PM

## 2024-04-17 NOTE — Progress Notes (Signed)
 Patient's temperature was trending up. I communicated with Dr. Tanda. It went up to 102, and he was ok with giving his scheduled Tylenol  early. Will recheck next shift.

## 2024-04-17 NOTE — Progress Notes (Signed)
 PT Cancellation Note  Patient Details Name: Alexander [name] MRN: [id number] DOB: May 23, 1961   Cancelled Treatment:     PT eval attempted but pt continues to decline 2* pain and fatigue.  Pt was willing to raise his right hand and swear to participate with PT tomorrow - no matter what.  Will follow.   Parish Augustine 04/17/2024, 12:03 PM

## 2024-04-17 NOTE — Progress Notes (Signed)
 PROGRESS NOTE    Alexander [name]  FMW:[id number] DOB: 06-08-1961 DOA: 04/09/2024 PCP: Alexander Lukes, PA   Brief Narrative: Alexander [name] is a 63 y.o. male with a history of pulmonary embolism, aortic atherosclerosis, peripheral artery disease, diverticulosis, diverticulitis, gout, GERD, hypertension, pulmonary hypertension, right colectomy with end ileostomy.  Patient presented from his general surgeons office secondary to abdominal wall wound dehiscence requiring surgical management.  Patient underwent exploratory laparotomy on 7/14 with ileostomy takedown, lysis of adhesions, sigmoid colectomy, diverting loop ileostomy.   Assessment and Plan:  Abdominal wall dehiscence Related to recent abdominal surgery from April 2025.  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  General surgery took patient for for exploratory laparotomy on 7/14 with ileostomy takedown, sigmoid colectomy with diverting loop ileostomy and lysis of adhesions.  Patient with significant postoperative pain, and patient started on Dilaudid  PCA for management per general surgery.  NG tube in place. -General Surgery recommendations: Pain management, wet-to-dry dressing daily to midline wound, full liquid diet  History of PE/DVT Patient is on Coumadin  as an outpatient which was held secondary to need for surgery. Patient transitioned to Heparin  IV. -Continue Heparin  and transition to Coumadin  pending general surgery recommendations  Anemia of chronic disease Baseline hemoglobin appears to be around 9.  Drop to 8.1, however rate of decrease appears to have lessened. Complicated by heparin  use. -CBC daily (pending today)  Acute metabolic acidosis Mild and stable.  Hyponatremia Mild.  Resolved.  Primary hypertension Noted history. Not on medication management. Blood pressure has been controlled and on the side of low-normal.  Hyperlipidemia Noted history. Not on medication management.  Aortic atherosclerosis Noted.  Gout Noted.  Insomnia Patient's trazodone  was initially continued, but now discontinued.  Pulmonary hypertension Noted history. Not on medication management.  Grade 1 diastolic heart dysfunction Noted history. Currently appears euvolemic. Not on medication management as an outpatient.   Per general surgery, if labs stable (still pending today), Dr. Tanda will assume care as primary and will re-consult TRH as needed.   DVT prophylaxis: Heparin  IV Code Status:   Code Status: Full Code Family Communication: None at bedside Disposition Plan: Discharge pending ongoing general surgery recommendations   Consultants:  General surgery  Procedures:  Laparotomy Colonoscopy  Antimicrobials: Cefotetan     Subjective: No specific concerns from overnight.  Objective: BP 126/76 (BP Location: Right Arm)   Pulse (!) 122   Temp 99.4 F (37.4 C) (Oral)   Resp 20   Ht 5' 9 (1.753 m)   Wt 71.3 kg   SpO2 95%   BMI 23.21 kg/m   Examination:  General exam: Appears calm and comfortable Respiratory system: Clear to auscultation. Respiratory effort normal. Cardiovascular system: S1 & S2 heard, tachycardia. Gastrointestinal system: Abdomen is distended, soft and tender. Normal bowel sounds heard. Central nervous system: Alert and oriented. No focal neurological deficits. Psychiatry: Judgement and insight appear normal. Mood & affect appropriate.     Data Reviewed: I have personally reviewed following labs and imaging studies  CBC Lab Results  Component Value Date   WBC 12.8 (H) 04/16/2024   RBC 2.64 (L) 04/16/2024   HGB 8.1 (L) 04/16/2024   HCT 27.4 (L) 04/16/2024   MCV 103.8 (H) 04/16/2024   MCH 30.7 04/16/2024   PLT 244 04/16/2024    MCHC 29.6 (L) 04/16/2024  RDW 17.0 (H) 04/16/2024   LYMPHSABS 1.1 04/11/2024   MONOABS 0.6 04/11/2024   EOSABS 0.6 (H) 04/11/2024   BASOSABS 0.0 04/11/2024     Last metabolic panel Lab Results  Component Value Date   NA 136 04/15/2024   K 4.4 04/15/2024   CL 107 04/15/2024   CO2 20 (L) 04/15/2024   BUN 23 04/15/2024   CREATININE 0.79 04/15/2024   GLUCOSE 115 (H) 04/15/2024   GFRNONAA >60 04/15/2024   GFRAA 111 01/22/2020   CALCIUM  8.5 (L) 04/15/2024   PHOS 3.5 04/09/2024   PROT 6.9 04/10/2024   ALBUMIN  3.1 (L) 04/10/2024   LABGLOB 3.1 01/22/2020   AGRATIO 1.4 01/22/2020   BILITOT 0.7 04/10/2024   ALKPHOS 54 04/10/2024   AST 14 (L) 04/10/2024   ALT 11 04/10/2024   ANIONGAP 9 04/15/2024    GFR: Estimated Creatinine Clearance: 95.7 mL/min (by C-G formula based on SCr of 0.79 mg/dL).  Recent Results (from the past 240 hours)  MRSA Next Gen by PCR, Nasal     Status: Abnormal   Collection Time: 04/09/24  4:19 PM   Specimen: Nasal Mucosa; Nasal Swab  Result Value Ref Range Status   MRSA by PCR Next Gen DETECTED (A) NOT DETECTED Final    Comment: (NOTE) The GeneXpert MRSA Assay (FDA approved for NASAL specimens only), is one component of a comprehensive MRSA colonization surveillance program. It is not intended to diagnose MRSA infection nor to guide or monitor treatment for MRSA infections. Test performance is not FDA approved in patients less than 84 years old. Performed at [facility], [address], [place], [place] [address]       Radiology Studies: No results found.     LOS: 8 days    Elgin Lam, MD [facility] 04/17/2024, 11:47 AM   If 7PM-7AM, please contact night-coverage [email or web address]

## 2024-04-17 NOTE — TOC Progression Note (Addendum)
 Transition of Care [facility]) - Progression Note    Patient Details  Name: Alexander [name] MRN: [id number] Date of Birth: 1961/09/28  Transition of Care [facility]) CM/SW Contact  Alfonse JONELLE Rex, RN Phone Number: 04/17/2024, 10:07 AM  Clinical Narrative: MD order for [facility] RN/HHA for wound care and ostomy support . [facility], rep-Amy, accepted for [facility] RN/HHA, added to AVS.       Expected Discharge Plan: Home/Self Care Barriers to Discharge: Continued Medical Work up  Expected Discharge Plan and Services   Discharge Planning Services: CM Consult   Living arrangements for the past 2 months: Single Family Home                                       Social Determinants of Health (SDOH) Interventions SDOH Screenings   Food Insecurity: No Food Insecurity (04/10/2024)  Housing: High Risk (04/10/2024)  Transportation Needs: No Transportation Needs (04/10/2024)  Recent Concern: Transportation Needs - Unmet Transportation Needs (03/30/2024)  Utilities: Not At Risk (04/10/2024)  Depression (PHQ2-9): Low Risk  (07/24/2023)  Tobacco Use: Medium Risk (04/13/2024)    Readmission Risk Interventions    04/10/2024    1:55 PM 01/24/2024   10:43 AM 01/13/2024    9:13 AM  Readmission Risk Prevention Plan  Transportation Screening Complete Complete Complete  PCP or Specialist Appt within 3-5 Days  Complete Complete  HRI or Home Care Consult  Complete Complete  Social Work Consult for Recovery Care Planning/Counseling  Complete Complete  Palliative Care Screening  Not Applicable Not Applicable  Medication Review Oceanographer) Complete Complete Complete  PCP or Specialist appointment within 3-5 days of discharge Complete    HRI or Home Care Consult Complete    SW Recovery Care/Counseling Consult Complete    Palliative Care Screening Not Applicable    Skilled Nursing Facility Not Applicable

## 2024-04-17 NOTE — Progress Notes (Signed)
 PHARMACY - ANTICOAGULATION CONSULT NOTE  Pharmacy Consult for Warfarin Indication: h/o PE and chronic thromboembolic dz  No Known Allergies  Patient Measurements: Height: 5' 9 (175.3 cm) Weight: 71.3 kg (157 lb 3 oz) IBW/kg (Calculated) : 70.7 HEPARIN  DW (KG): 72.8  Vital Signs: Temp: 99.4 F (37.4 C) (07/18 1025) Temp Source: Oral (07/18 1025) BP: 126/76 (07/18 1025) Pulse Rate: 122 (07/18 1025)  Labs: Recent Labs    04/15/24 0553 04/15/24 1532 04/15/24 2341 04/16/24 0657 04/16/24 0924 04/16/24 1750  HGB 8.4*  --   --   --  8.1*  --   HCT 26.1*  --   --   --  27.4*  --   PLT 226  --   --   --  244  --   LABPROT 17.5*  --   --  16.1*  --   --   INR 1.4*  --   --  1.2  --   --   HEPARINUNFRC 0.15*   < > 0.33 0.29*  --  0.49  CREATININE 0.79  --   --   --   --   --    < > = values in this interval not displayed.    Estimated Creatinine Clearance: 95.7 mL/min (by C-G formula based on SCr of 0.79 mg/dL).  Assessment: On warfarin 7.5mg  PO daily PTA for hx of PE and chronic thromboembolic disease. Bridged with heparin  for colonscopy and ileostomy reversal.   -Significant events:   7/13: s/p colonoscopy 7/14: s/p ileostomy takedown, sigmoid colectomy with diverting loop ileostomy.  SQH given pre-op.  7/15:  OK to resume IV heparin  bridge   Today, 04/16/24 (PM) Phlebotomy unable to obtain blood for labs x 4 attempts Switch from IV heparin  to subcutaneous Lovenox  and resume warfarin (to be dosed by Pharmacy) per Surgery's orders   Goal of Therapy:  Heparin  level 0.3-0.7 units/ml Monitor platelets by anticoagulation protocol: Yes INR 2-3   Plan:  Continue Lovenox  70 mg (approximately 1 mg/kg) subcutaneous q12h until INR therapeutic Warfarin 7.5 mg po daily Monitor daily INR for warfarin dosing, CBC, signs/symptoms of bleeding   Thank you for allowing pharmacy to be a part of this patient's care.  Eleanor EMERSON Agent, PharmD, BCPS Clinical Pharmacist [facility] 04/17/2024 1:33 PM

## 2024-04-17 NOTE — Progress Notes (Signed)
 4 Days Post-Op   Subjective/Chief Complaint: C/o abd pain Has tolerated clears without n/v Having liquid ostomy output Hasn't been out of bed Refusing pt due to pain Lab tried 4x for am blood draw and pt refusing addl attempts to AM    Objective: Vital signs in last 24 hours: Temp:  [98.4 F (36.9 C)-99.4 F (37.4 C)] 99.4 F (37.4 C) (07/18 1025) Pulse Rate:  [93-122] 122 (07/18 1025) Resp:  [15-25] 20 (07/18 1133) BP: (126-143)/(76-85) 126/76 (07/18 1025) SpO2:  [95 %-99 %] 95 % (07/18 1133) Last BM Date : 04/17/24  Intake/Output from previous day: 07/17 0701 - 07/18 0700 In: 3322.5 [P.O.:240; I.V.:3082.5] Out: 1630 [Urine:1400; Stool:230] Intake/Output this shift: Total I/O In: 668.5 [P.O.:240; I.V.:428.5] Out: 100 [Stool:100]  Awake, alert, nontoxic Symm chest rise but not taking deep breathes Mild tachy Some distension, liquid stool in bag; some serosang drainage on midline wound, approp TTP Midline wound ok- fascia intact -nurse at bedside emptying ostomy bag No edema Lab Results:  Recent Labs    04/15/24 0553 04/16/24 0924  WBC 13.5* 12.8*  HGB 8.4* 8.1*  HCT 26.1* 27.4*  PLT 226 244   BMET Recent Labs    04/15/24 0553  NA 136  K 4.4  CL 107  CO2 20*  GLUCOSE 115*  BUN 23  CREATININE 0.79  CALCIUM  8.5*   PT/INR Recent Labs    04/15/24 0553 04/16/24 0657  LABPROT 17.5* 16.1*  INR 1.4* 1.2   ABG No results for input(s): PHART, HCO3 in the last 72 hours.  Invalid input(s): PCO2, PO2   Studies/Results: No results found.   Anti-infectives: Anti-infectives (From admission, onward)    Start     Dose/Rate Route Frequency Ordered Stop   04/13/24 0900  cefoTEtan  (CEFOTAN ) 2 g in sodium chloride  0.9 % 100 mL IVPB        2 g 200 mL/hr over 30 Minutes Intravenous On call to O.R. 04/10/24 0838 04/13/24 1109       Assessment/Plan: history of right colectomy with end ileostomy for perforated bleeding diverticulitis April 2025   abdominal dehiscence with exposed bowel   s/p Procedure(s): ILEOSTOMY TAKEDOWN (N/A) LYSIS, ADHESIONS 2 hours (N/A) SIGMOIDCOLECTOMY WITH DIVERTING LOOP ILEOSTOMY (N/A) LAPAROTOMY, EXPLORATORY (N/A) SPLENIC FLEXURE MOBILIZATION 04/13/24 EW  Wet - dry dressing to midline daily  GI -see below, ostomy teaching FEN - decrease mIVF, hypomagnesia - resolved, cont FLD-patient does not want to advance to a soft diet today Pulm - incentive spirometry, OOB Heme - hgb down some hgb 10.2-->8.4-->8.1,  on hep gtt, maintain t&c, repeat cbc in am; patient is a hard stick.  They have tried multiple times today and he is declining additional attempts today.  Pharmacy has switched him to therapeutic Lovenox .  Will attempt another lab draw in the morning.  No signs of hypotension. H/o VTE - hep gtt stopping and switching to therapeutic Lovenox , can start Coumadin  tonight Foley - none Pain -scheduled oral tylenol , DC PCA, as needed oxycodone  and IV Dilaudid ; start scheduled low-dose gabapentin  Pt/ot eval  Dispo-ongoing pain control,  Appreciate TRH assist on this pt  [name], MD, FACS General, Bariatric, & Minimally Invasive Surgery [facility],  A [facility]   LOS: 8 days    Camellia Tanda 04/17/2024

## 2024-04-17 NOTE — Progress Notes (Signed)
 BP 129/76 (BP Location: Right Arm)   Pulse (!) 105   Temp 99.4 F (37.4 C) (Oral)   Resp 17   Ht 5' 9 (1.753 m)   Wt 71.3 kg   SpO2 100%   BMI 23.21 kg/m   Patient was seen and examined this evening.  I was alerted earlier that he had a temperature up to 102.  He was given Tylenol .  He states that they rechecked his temperature and it was 99.  He is now using his incentive spirometer.  Appears comfortable. Abdomen was soft, mildly distended, appropriate tenderness, liquid stool in ileostomy bag.  There appears to be some overflow into his midline dressing like earlier today.  I took down the midline abdominal dressing just to make sure there is no signs of enteric contents coming from the midline.  The fascia was intact and closed.  There is no drainage of bilious material from the midline.  Just appears to be overflow from his ileostomy bag.  I redressed the abdominal incision.  [name], MD, FACS General, Bariatric, & Minimally Invasive Surgery [facility] Surgery,  A [facility]

## 2024-04-18 LAB — BASIC METABOLIC PANEL WITH GFR
Anion gap: 7 (ref 5–15)
BUN: 6 mg/dL — ABNORMAL LOW (ref 8–23)
CO2: 23 mmol/L (ref 22–32)
Calcium: 8.6 mg/dL — ABNORMAL LOW (ref 8.9–10.3)
Chloride: 111 mmol/L (ref 98–111)
Creatinine, Ser: 0.72 mg/dL (ref 0.61–1.24)
GFR, Estimated: >60 mL/min (ref >60)
Glucose, Bld: 114 mg/dL — ABNORMAL HIGH (ref 70–99)
Potassium: 2.9 mmol/L — ABNORMAL LOW (ref 3.5–5.1)
Sodium: 141 mmol/L (ref 135–145)

## 2024-04-18 LAB — CBC
HCT: 24.2 % — ABNORMAL LOW (ref 39.0–52.0)
Hemoglobin: 7.8 g/dL — ABNORMAL LOW (ref 13.0–17.0)
MCH: 31.3 pg (ref 26.0–34.0)
MCHC: 32.2 g/dL (ref 30.0–36.0)
MCV: 97.2 fL (ref 80.0–100.0)
Platelets: 258 K/uL (ref 150–400)
RBC: 2.49 MIL/uL — ABNORMAL LOW (ref 4.22–5.81)
RDW: 16.6 % — ABNORMAL HIGH (ref 11.5–15.5)
WBC: 10.1 K/uL (ref 4.0–10.5)
nRBC: 0.4 % — ABNORMAL HIGH (ref 0.0–0.2)

## 2024-04-18 LAB — PROTIME-INR
INR: 1.3 — ABNORMAL HIGH (ref 0.8–1.2)
Prothrombin Time: 16.7 s — ABNORMAL HIGH (ref 11.4–15.2)

## 2024-04-18 LAB — MAGNESIUM: Magnesium: 1.7 mg/dL (ref 1.7–2.4)

## 2024-04-18 MED ORDER — POTASSIUM CHLORIDE 10 MEQ/100ML IV SOLN
10.0000 meq | INTRAVENOUS | Status: AC
  Administered 2024-04-18 (×2): 10 meq via INTRAVENOUS
  Filled 2024-04-18 (×2): qty 100

## 2024-04-18 MED ORDER — MAGNESIUM SULFATE IN D5W 1-5 GM/100ML-% IV SOLN
1.0000 g | Freq: Once | INTRAVENOUS | Status: AC
  Administered 2024-04-18: 1 g via INTRAVENOUS
  Filled 2024-04-18: qty 100

## 2024-04-18 MED ORDER — POTASSIUM CHLORIDE CRYS ER 20 MEQ PO TBCR
40.0000 meq | EXTENDED_RELEASE_TABLET | Freq: Once | ORAL | Status: AC
Start: 2024-04-18 — End: 2024-04-18
  Administered 2024-04-18: 40 meq via ORAL
  Filled 2024-04-18: qty 2

## 2024-04-18 MED ORDER — POTASSIUM CHLORIDE IN NACL 20-0.9 MEQ/L-% IV SOLN
INTRAVENOUS | Status: AC
  Filled 2024-04-18 (×2): qty 1000

## 2024-04-18 MED ORDER — HYDROMORPHONE HCL 1 MG/ML IJ SOLN
0.5000 mg | INTRAMUSCULAR | Status: DC | PRN
  Administered 2024-04-18 (×2): 0.5 mg via INTRAVENOUS
  Administered 2024-04-18: 1 mg via INTRAVENOUS
  Administered 2024-04-19 (×2): 0.5 mg via INTRAVENOUS
  Filled 2024-04-18 (×6): qty 0.5

## 2024-04-18 NOTE — Progress Notes (Signed)
 PHARMACY - ANTICOAGULATION CONSULT NOTE  Pharmacy Consult for Warfarin Indication: h/o PE and chronic thromboembolic dz  No Known Allergies  Patient Measurements: Height: 5' 9 (175.3 cm) Weight: 71.5 kg (157 lb 10.1 oz) IBW/kg (Calculated) : 70.7 HEPARIN  DW (KG): 72.8  Vital Signs: Temp: 98.6 F (37 C) (07/19 0928) Temp Source: Oral (07/19 0928) BP: 129/82 (07/19 0928) Pulse Rate: 92 (07/19 0928)  Labs: Recent Labs    04/15/24 2341 04/16/24 0657 04/16/24 0924 04/16/24 1750 04/18/24 0529  HGB  --   --  8.1*  --  7.8*  HCT  --   --  27.4*  --  24.2*  PLT  --   --  244  --  258  LABPROT  --  16.1*  --   --  16.7*  INR  --  1.2  --   --  1.3*  HEPARINUNFRC 0.33 0.29*  --  0.49  --   CREATININE  --   --   --   --  0.72    Estimated Creatinine Clearance: 95.7 mL/min (by C-G formula based on SCr of 0.72 mg/dL).  Assessment: On warfarin 7.5mg  PO daily PTA for hx of PE and chronic thromboembolic disease. Bridged with heparin  for colonscopy and ileostomy reversal.   -Significant events:   7/13: s/p colonoscopy 7/14: s/p ileostomy takedown, sigmoid colectomy with diverting loop ileostomy.  SQH given pre-op.  7/15:  OK to resume IV heparin  bridge 7/17:  Phlebotomy unable to obtain blood for labs x 4 attempts.  Switched from IV heparin  to subcutaneous Lovenox  and resumed warfarin (to be dosed by Pharmacy) per Surgery's orders   Today, 04/16/24 (PM) INR 1.3, yesterday's warfarin dose = 7.5 mg Hemoglobin down to 7.8; Surgery aware and monitoring  Goal of Therapy:  Heparin  level 0.3-0.7 units/ml Monitor platelets by anticoagulation protocol: Yes INR 2-3   Plan:  Continue Lovenox  70 mg (approximately 1 mg/kg) subcutaneous q12h until INR therapeutic Continue Warfarin 7.5 mg po daily Monitor daily INR for warfarin dosing, CBC, signs/symptoms of bleeding   Thank you for allowing pharmacy to be a part of this patient's care.  Eleanor EMERSON Agent, PharmD, BCPS Clinical  Pharmacist [facility] 04/18/2024 11:55 AM

## 2024-04-18 NOTE — Progress Notes (Addendum)
 5 Days Post-Op   Subjective/Chief Complaint: Abd pain better Has tolerated FLD without n/v Having liquid ostomy output Hasn't been out of bed Spiking temps to 102    Objective: Vital signs in last 24 hours: Temp:  [99.4 F (37.4 C)-102.8 F (39.3 C)] 99.6 F (37.6 C) (07/19 0511) Pulse Rate:  [105-122] 109 (07/19 0511) Resp:  [17-20] 17 (07/19 0511) BP: (111-143)/(69-83) 111/69 (07/19 0511) SpO2:  [90 %-100 %] 92 % (07/19 0511) Weight:  [71.5 kg] 71.5 kg (07/19 0500) Last BM Date : 04/17/24  Intake/Output from previous day: 07/18 0701 - 07/19 0700 In: 2404.3 [P.O.:670; I.V.:1734.3] Out: 1725 [Urine:650; Stool:1075] Intake/Output this shift: Total I/O In: 120 [P.O.:120] Out: 215 [Urine:200; Stool:15]  Awake, alert, nontoxic Symm chest rise but not taking deep breathes; IS about 400 Mild tachy Some distension, liquid stool in bag; some serosang drainage on midline wound, approp TTP Midline wound ok- fascia intact; no enteric drainage from midline fascia No edema; no calf TTP Lab Results:  Recent Labs    04/16/24 0924 04/18/24 0529  WBC 12.8* 10.1  HGB 8.1* 7.8*  HCT 27.4* 24.2*  PLT 244 258   BMET Recent Labs    04/18/24 0529  NA 141  K 2.9*  CL 111  CO2 23  GLUCOSE 114*  BUN 6*  CREATININE 0.72  CALCIUM  8.6*   PT/INR Recent Labs    04/16/24 0657 04/18/24 0529  LABPROT 16.1* 16.7*  INR 1.2 1.3*   ABG No results for input(s): PHART, HCO3 in the last 72 hours.  Invalid input(s): PCO2, PO2   Studies/Results: No results found.   Anti-infectives: Anti-infectives (From admission, onward)    Start     Dose/Rate Route Frequency Ordered Stop   04/13/24 0900  cefoTEtan  (CEFOTAN ) 2 g in sodium chloride  0.9 % 100 mL IVPB        2 g 200 mL/hr over 30 Minutes Intravenous On call to O.R. 04/10/24 0838 04/13/24 1109       Assessment/Plan: history of right colectomy with end ileostomy for perforated bleeding diverticulitis April 2025   abdominal dehiscence with exposed bowel   s/p Procedure(s): ILEOSTOMY TAKEDOWN (N/A) LYSIS, ADHESIONS 2 hours (N/A) SIGMOIDCOLECTOMY WITH DIVERTING LOOP ILEOSTOMY (N/A) LAPAROTOMY, EXPLORATORY (N/A) SPLENIC FLEXURE MOBILIZATION 04/13/24 EW  Wet - dry dressing to midline daily  GI -see below, ostomy teaching FEN - change mIVF to NS w/KCL, hypokalemia - replace potassium, check mag level; adv to soft Pulm - incentive spirometry, OOB Heme - hgb down some hgb 10.2-->8.4-->8.1-->7.8,   Pharmacy has switched him to therapeutic Lovenox .   H/o VTE - hep gtt stopped, switched to therapeutic Lovenox ,  Coumadin  restarted, mgmt per pharm Foley - none Pain -scheduled oral tylenol , , as needed oxycodone  and IV Dilaudid ;  scheduled low-dose gabapentin ; will decrease prn frequency of dilaudid  Pt/ot eval  Dispo-febrile but wbc ok. Will cont to monitor temp curve.   Appreciate TRH assist on this pt  [name], MD, FACS General, Bariatric, & Minimally Invasive Surgery [facility] Surgery,  A [facility]   LOS: 9 days    Camellia Tanda 04/18/2024

## 2024-04-18 NOTE — Plan of Care (Signed)
   Problem: Education: Goal: Knowledge of General Education information will improve Description Including pain rating scale, medication(s)/side effects and non-pharmacologic comfort measures Outcome: Progressing   Problem: Health Behavior/Discharge Planning: Goal: Ability to manage health-related needs will improve Outcome: Progressing

## 2024-04-18 NOTE — Care Management Note (Signed)
 Paged Dr. Deward Foy at [phone], after patient moved into [place] with elevated temp, blood pressure and HR. Pt was c/o 10/10 pain but prn were not due. Doctor called back the page, nurse explained that the pt had an elevated temperature c/o of chills, an elevated BP and HR; doctor gave a verbal order for a one time 1 mg dose of dilaudid  STAT. Nurse administered two 0.5 mg injections of dilaudid  via PIV to patient. Will continue to monitor patient for improvement.   Rutherford Louder, RN

## 2024-04-18 NOTE — Progress Notes (Signed)
 PT Cancellation Note  Patient Details Name: Alexander [name] MRN: [id number] DOB: Oct 23, 1960   Cancelled Treatment:     PT eval attempted for fourth time.  Despite pt's prior promise of participation, adamantly refuses 2* c/o pain and fatigue.  W.ill follow   Payson Crumby 04/18/2024, 2:13 PM

## 2024-04-18 NOTE — Progress Notes (Signed)
 OT Cancellation Note  Patient Details Name: Alexander [name] MRN: [id number] DOB: [birth year]   Cancelled Treatment:    Reason Eval/Treat Not Completed: Pain limiting ability to participate. Attempted x2 (once AM, once PM) and Pt refusing due to pain and a rough night OT will continue to follow for evaluation. Says that he will do it tomorrow (note: he said the same thing yesterday)  Leita JINNY Odea 04/18/2024, 2:14 PM  Leita DEL OTR/L Acute Rehabilitation Services Office: [phone]

## 2024-04-18 NOTE — Progress Notes (Signed)
   04/18/24 1424  Assess: MEWS Score  Temp (!) 101.2 F (38.4 C)  BP 131/72  MAP (mmHg) 90  Pulse Rate (!) 116  Resp 18  SpO2 93 %  O2 Device Room Air  Assess: MEWS Score  MEWS Temp 1  MEWS Systolic 0  MEWS Pulse 2  MEWS RR 0  MEWS LOC 0  MEWS Score 3  MEWS Score Color Yellow  Assess: if the MEWS score is Yellow or Red  Were vital signs accurate and taken at a resting state? Yes  Does the patient meet 2 or more of the SIRS criteria? No  MEWS guidelines implemented  Yes, yellow  Treat  MEWS Interventions Considered administering scheduled or prn medications/treatments as ordered  Take Vital Signs  Increase Vital Sign Frequency  Yellow: Q2hr x1, continue Q4hrs until patient remains green for 12hrs  Escalate  MEWS: Escalate Yellow: Discuss with charge nurse and consider notifying provider and/or RRT  Notify: Charge Nurse/RN  Name of Charge Nurse/RN Notified leann  Assess: SIRS CRITERIA  SIRS Temperature  1  SIRS Respirations  0  SIRS Pulse 1  SIRS WBC 0  SIRS Score Sum  2

## 2024-04-18 NOTE — Progress Notes (Signed)
 Dr. Valentine followed up via chat @1425 , then came by in-person to check on pt at 1510. No new orders.

## 2024-04-18 NOTE — Plan of Care (Signed)
  Problem: Clinical Measurements: Goal: Ability to maintain clinical measurements within normal limits will improve Outcome: Progressing Goal: Will remain free from infection Outcome: Progressing   Problem: Safety: Goal: Ability to remain free from injury will improve Outcome: Progressing   

## 2024-04-18 NOTE — Progress Notes (Signed)
   04/18/24 1319  Assess: MEWS Score  Temp (!) 102.9 F (39.4 C)  BP (!) 141/85  MAP (mmHg) 99  Pulse Rate (!) 116  SpO2 94 %  O2 Device Room Air  Assess: MEWS Score  MEWS Temp 2  MEWS Systolic 0  MEWS Pulse 2  MEWS RR 0  MEWS LOC 0  MEWS Score 4  MEWS Score Color Red  Assess: if the MEWS score is Yellow or Red  Were vital signs accurate and taken at a resting state? Yes  Does the patient meet 2 or more of the SIRS criteria? No  MEWS guidelines implemented  Yes, red  Treat  MEWS Interventions Considered administering scheduled or prn medications/treatments as ordered  Take Vital Signs  Increase Vital Sign Frequency  Red: Q1hr x2, continue Q4hrs until patient remains green for 12hrs  Escalate  MEWS: Escalate Red: Discuss with charge nurse and notify provider. Consider notifying RRT. If remains red for 2 hours consider need for higher level of care  Notify: Charge Nurse/RN  Name of Charge Nurse/RN Notified Leann  Provider Notification  Provider Name/Title [place]  Date Provider Notified 04/18/24  Time Provider Notified 1325  Method of Notification Page  Assess: SIRS CRITERIA  SIRS Temperature  1  SIRS Respirations  0  SIRS Pulse 1  SIRS WBC 0  SIRS Score Sum  2

## 2024-04-19 ENCOUNTER — Inpatient Hospital Stay (HOSPITAL_COMMUNITY)

## 2024-04-19 LAB — URINALYSIS, ROUTINE W REFLEX MICROSCOPIC
Bilirubin Urine: NEGATIVE
Glucose, UA: NEGATIVE mg/dL
Hgb urine dipstick: NEGATIVE
Ketones, ur: 5 mg/dL — AB
Leukocytes,Ua: NEGATIVE
Nitrite: NEGATIVE
Protein, ur: NEGATIVE mg/dL
Specific Gravity, Urine: 1.021 (ref 1.005–1.030)
pH: 5 (ref 5.0–8.0)

## 2024-04-19 LAB — CBC
HCT: 27.1 % — ABNORMAL LOW (ref 39.0–52.0)
Hemoglobin: 8.7 g/dL — ABNORMAL LOW (ref 13.0–17.0)
MCH: 30.7 pg (ref 26.0–34.0)
MCHC: 32.1 g/dL (ref 30.0–36.0)
MCV: 95.8 fL (ref 80.0–100.0)
Platelets: 323 K/uL (ref 150–400)
RBC: 2.83 MIL/uL — ABNORMAL LOW (ref 4.22–5.81)
RDW: 16.6 % — ABNORMAL HIGH (ref 11.5–15.5)
WBC: 12.1 K/uL — ABNORMAL HIGH (ref 4.0–10.5)
nRBC: 0.8 % — ABNORMAL HIGH (ref 0.0–0.2)

## 2024-04-19 LAB — MAGNESIUM: Magnesium: 1.7 mg/dL (ref 1.7–2.4)

## 2024-04-19 LAB — BASIC METABOLIC PANEL WITH GFR
Anion gap: 10 (ref 5–15)
BUN: 8 mg/dL (ref 8–23)
CO2: 22 mmol/L (ref 22–32)
Calcium: 8.5 mg/dL — ABNORMAL LOW (ref 8.9–10.3)
Chloride: 108 mmol/L (ref 98–111)
Creatinine, Ser: 0.74 mg/dL (ref 0.61–1.24)
GFR, Estimated: >60 mL/min (ref >60)
Glucose, Bld: 102 mg/dL — ABNORMAL HIGH (ref 70–99)
Potassium: 3.3 mmol/L — ABNORMAL LOW (ref 3.5–5.1)
Sodium: 140 mmol/L (ref 135–145)

## 2024-04-19 LAB — PROTIME-INR
INR: 2 — ABNORMAL HIGH (ref 0.8–1.2)
Prothrombin Time: 24.1 s — ABNORMAL HIGH (ref 11.4–15.2)

## 2024-04-19 MED ORDER — POTASSIUM CHLORIDE IN NACL 20-0.9 MEQ/L-% IV SOLN
INTRAVENOUS | Status: AC
  Filled 2024-04-19: qty 1000

## 2024-04-19 MED ORDER — HYDROMORPHONE HCL 1 MG/ML IJ SOLN
0.5000 mg | INTRAMUSCULAR | Status: DC | PRN
  Administered 2024-04-19 – 2024-04-30 (×41): 0.5 mg via INTRAVENOUS
  Filled 2024-04-19 (×41): qty 0.5

## 2024-04-19 NOTE — Evaluation (Addendum)
 Physical Therapy Evaluation Patient Details Name: Alexander [name] MRN: [id number] DOB: 01/22/1961 Today's Date: 04/19/2024  History of Present Illness  63 y.o. male  presented from his general surgeons office secondary to abdominal wall wound dehiscence requiring surgical management. Patient underwent exploratory laparotomy on 7/14 with ileostomy takedown, lysis of adhesions, sigmoid colectomy, diverting loop ileostomy.   PMH: pulmonary embolism, aortic atherosclerosis, peripheral artery disease, diverticulosis, diverticulitis, gout, GERD, hypertension, pulmonary hypertension, right colectomy with end ileostomy.  Clinical Impression  Pt admitted with above diagnosis.  After multiple refusals/other therapist attempts pt agreeable to PT today. Pt reports he has been staying with his sister, he amb with RW or no device since last admission depending on the day. Pt sat EOB with ~ mod assist, declined attempt to stand but was agreeable to bed to chair transfer.   Pt performed lateral scooting transfer with +2 mod assist. SpO2= 96% on RA after transfer. NT present and aware.  Encouraged OOB as much as possible. May need continued inpatient follow up therapy, <3 hours/day vs HHPT pending progress and LOS   Pt currently with functional limitations due to the deficits listed below (see PT Problem List). Pt will benefit from acute skilled PT to increase their independence and safety with mobility to allow discharge.           If plan is discharge home, recommend the following: A lot of help with walking and/or transfers;A lot of help with bathing/dressing/bathroom;Assistance with cooking/housework;Assist for transportation;Help with stairs or ramp for entrance   Can travel by private vehicle   No    Equipment Recommendations None recommended by PT  Recommendations for Other Services       Functional Status Assessment Patient has had a recent decline in their functional status and demonstrates the  ability to make significant improvements in function in a reasonable and predictable amount of time.     Precautions / Restrictions Precautions Precautions: Fall Precaution/Restrictions Comments: abd wound/incision dehiscence, ileostomy Restrictions Weight Bearing Restrictions Per Provider Order: No      Mobility  Bed Mobility Overal bed mobility: Needs Assistance Bed Mobility: Supine to Sit     Supine to sit: Mod assist, +2 for safety/equipment, HOB elevated     General bed mobility comments: assist to progress LEs off EOB and elevate trunl, incr time; pt not amenable to instructions for log roll technique at this time    Transfers Overall transfer level: Needs assistance   Transfers: Bed to chair/wheelchair/BSC            Lateral/Scoot Transfers: +2 physical assistance, +2 safety/equipment, Mod assist, Min assist General transfer comment: bed pad used to assist with lateral scooting transfer bed >chair. cues to self assist with UEs/LEs as able    Ambulation/Gait                  Stairs            Wheelchair Mobility     Tilt Bed    Modified Rankin (Stroke Patients Only)       Balance Overall balance assessment: Needs assistance Sitting-balance support: Feet supported, Bilateral upper extremity supported, No upper extremity supported Sitting balance-Leahy Scale: Poor Sitting balance - Comments: with incr time pt able to sit EOB without UE support       Standing balance comment: NT/pt declined attempt to stand  Pertinent Vitals/Pain Pain Assessment Pain Assessment: Faces Faces Pain Scale: Hurts even more Pain Location: abd with movement Pain Descriptors / Indicators: Grimacing, Guarding Pain Intervention(s): Limited activity within patient's tolerance, Monitored during session, Premedicated before session, Repositioned    Home Living Family/patient expects to be discharged to:: Private  residence Living Arrangements: Other relatives (sister) Available Help at Discharge: Family Type of Home: House Home Access: Level entry       Home Layout: One level Home Equipment: Agricultural consultant (2 wheels);Cane - single point Additional Comments: some infor taken from prior admission    Prior Function               Mobility Comments: RW or no device recently per pt. ADLs Comments: sister has been assisting meal prep/cooking  since last admssion; reports ind with ADLs     Extremity/Trunk Assessment   Upper Extremity Assessment Upper Extremity Assessment: Defer to OT evaluation    Lower Extremity Assessment Lower Extremity Assessment: Generalized weakness       Communication        Cognition Arousal: Alert Behavior During Therapy: Flat affect   PT - Cognitive impairments: No apparent impairments                         Following commands: Impaired Following commands impaired: Follows one step commands with increased time, Follows multi-step commands with increased time     Cueing Cueing Techniques: Verbal cues, Tactile cues     General Comments      Exercises     Assessment/Plan    PT Assessment Patient needs continued PT services  PT Problem List Decreased strength;Decreased activity tolerance;Decreased balance;Decreased mobility;Decreased knowledge of use of DME;Pain       PT Treatment Interventions DME instruction;Therapeutic exercise;Gait training;Functional mobility training;Therapeutic activities;Patient/family education    PT Goals (Current goals can be found in the Care Plan section)  Acute Rehab PT Goals PT Goal Formulation: With patient Time For Goal Achievement: 05/03/24 Potential to Achieve Goals: Fair    Frequency Min 3X/week     Co-evaluation               AM-PAC PT 6 Clicks Mobility  Outcome Measure Help needed turning from your back to your side while in a flat bed without using bedrails?: A Lot Help  needed moving from lying on your back to sitting on the side of a flat bed without using bedrails?: A Lot Help needed moving to and from a bed to a chair (including a wheelchair)?: Total Help needed standing up from a chair using your arms (e.g., wheelchair or bedside chair)?: Total Help needed to walk in hospital room?: Total Help needed climbing 3-5 steps with a railing? : Total 6 Click Score: 8    End of Session   Activity Tolerance: Patient limited by fatigue;Patient limited by pain Patient left: in chair;with chair alarm set;with call bell/phone within reach;with nursing/sitter in room Nurse Communication: Mobility status PT Visit Diagnosis: Other abnormalities of gait and mobility (R26.89)    Time: 8746-8682 PT Time Calculation (min) (ACUTE ONLY): 24 min   Charges:   PT Evaluation $PT Eval Low Complexity: 1 Low PT Treatments $Therapeutic Activity: 8-22 mins PT General Charges $$ ACUTE PT VISIT: 1 Visit         Rexene, PT  Acute Rehab Dept [facility]) [phone]  04/19/2024   [facility] 04/19/2024, 1:48 PM

## 2024-04-19 NOTE — Progress Notes (Signed)
 PHARMACY - ANTICOAGULATION CONSULT NOTE  Pharmacy Consult for Warfarin Indication: h/o PE and chronic thromboembolic dz  No Known Allergies  Patient Measurements: Height: 5' 9 (175.3 cm) Weight: 70.5 kg (155 lb 6.8 oz) IBW/kg (Calculated) : 70.7 HEPARIN  DW (KG): 72.8  Vital Signs: Temp: 97.6 F (36.4 C) (07/20 0918) Temp Source: Oral (07/20 0918) BP: 112/69 (07/20 0918) Pulse Rate: 96 (07/20 0918)  Labs: Recent Labs    04/16/24 1750 04/18/24 0529 04/19/24 0457  HGB  --  7.8* 8.7*  HCT  --  24.2* 27.1*  PLT  --  258 323  LABPROT  --  16.7* 24.1*  INR  --  1.3* 2.0*  HEPARINUNFRC 0.49  --   --   CREATININE  --  0.72 0.74    Estimated Creatinine Clearance: 95.5 mL/min (by C-G formula based on SCr of 0.74 mg/dL).  Assessment: On warfarin 7.5mg  PO daily PTA for hx of PE and chronic thromboembolic disease. Bridged with heparin  for colonscopy and ileostomy reversal.   -Significant events:   7/13: s/p colonoscopy 7/14: s/p ileostomy takedown, sigmoid colectomy with diverting loop ileostomy.  SQH given pre-op.  7/15:  OK to resume IV heparin  bridge 7/17:  Phlebotomy unable to obtain blood for labs x 4 attempts.  Switched from IV heparin  to subcutaneous Lovenox  and resumed warfarin (to be dosed by Pharmacy) per Surgery's orders   Today, 04/16/24 (PM) INR 2.0-now within goal, yesterday's warfarin dose = 7.5 mg Hemoglobin up to 8.7  Goal of Therapy:  Heparin  level 0.3-0.7 units/ml Monitor platelets by anticoagulation protocol: Yes INR 2-3   Plan:  Discontinue Lovenox  Continue Warfarin 7.5 mg po daily Monitor daily INR for warfarin dosing, CBC, signs/symptoms of bleeding   Thank you for allowing pharmacy to be a part of this patient's care.  Eleanor EMERSON Agent, PharmD, BCPS Clinical Pharmacist [place] 04/19/2024 10:39 AM

## 2024-04-19 NOTE — Evaluation (Signed)
 Occupational Therapy Evaluation Patient Details Name: Alexander [name] MRN: [id number] DOB: March 09, 1961 Today's Date: 04/19/2024   History of Present Illness   63 y.o. male  presented from his general surgeons office secondary to abdominal wall wound dehiscence requiring surgical management. Patient underwent exploratory laparotomy on 7/14 with ileostomy takedown, lysis of adhesions, sigmoid colectomy, diverting loop ileostomy.   PMH: pulmonary embolism, aortic atherosclerosis, peripheral artery disease, diverticulosis, diverticulitis, gout, GERD, hypertension, pulmonary hypertension, right colectomy with end ileostomy.     Clinical Impressions Pt admitted for above and presents with problem list below.  Pt transferred into recliner with PT and declined further mobility with OT.  He engages in ADLs from recliner level, up to mod assist for LB ADLs and setup for UB ADLs. Educated on compensatory techniques for abdominal comfort, frequent repositioning for pressure relief and use of incentive spirometer.  Pt voiced understanding with recommendations.  Pt reports PTA he was able to manage Adls and his sister assisted with IADls. Based on performance today, believe patient will best benefit from continued OT services acutely and after dc at inpatient setting with <3hrs/day to optimize independence, safety with ADLs and mobility.      If plan is discharge home, recommend the following:   Two people to help with walking and/or transfers;A lot of help with bathing/dressing/bathroom;Assist for transportation;Help with stairs or ramp for entrance;Assistance with cooking/housework     Functional Status Assessment   Patient has had a recent decline in their functional status and demonstrates the ability to make significant improvements in function in a reasonable and predictable amount of time.     Equipment Recommendations   Other (comment) (defer)     Recommendations for Other Services          Precautions/Restrictions   Precautions Precautions: Fall Precaution/Restrictions Comments: abd wound/incision dehiscence, ileostomy Restrictions Weight Bearing Restrictions Per Provider Order: No     Mobility Bed Mobility               General bed mobility comments: OOB in recliner    Transfers                          Balance Overall balance assessment: Needs assistance Sitting-balance support: No upper extremity supported, Feet supported Sitting balance-Leahy Scale: Fair Sitting balance - Comments: sitting supported in recliner during ADLs                                   ADL either performed or assessed with clinical judgement   ADL Overall ADL's : Needs assistance/impaired     Grooming: Set up;Sitting;Wash/dry face           Upper Body Dressing : Set up;Sitting   Lower Body Dressing: Moderate assistance;Sitting/lateral leans Lower Body Dressing Details (indicate cue type and reason): pt able to figure 4 to manage socks, but would need assist to bring clothing over hips. He declines attempts to Database administrator Details (indicate cue type and reason): deferred, lateral scoot with PT to recliner and defers further mobility at this time           General ADL Comments: seen in recliner for ADL engagement     Vision   Vision Assessment?: No apparent visual deficits     Perception         Praxis         Pertinent Vitals/Pain  Pain Assessment Pain Assessment: Faces Faces Pain Scale: Hurts even more Pain Location: abd with movement Pain Descriptors / Indicators: Grimacing, Guarding Pain Intervention(s): Limited activity within patient's tolerance, Monitored during session, Repositioned     Extremity/Trunk Assessment Upper Extremity Assessment Upper Extremity Assessment: Generalized weakness;Right hand dominant   Lower Extremity Assessment Lower Extremity Assessment: Defer to PT evaluation        Communication Communication Communication: Impaired Factors Affecting Communication: Reduced clarity of speech   Cognition Arousal: Alert Behavior During Therapy: Flat affect Cognition: No apparent impairments             OT - Cognition Comments: not formally assessed, appears functional                 Following commands: Impaired Following commands impaired: Follows one step commands with increased time, Follows multi-step commands with increased time     Cueing  General Comments   Cueing Techniques: Verbal cues;Tactile cues  sister and his wife at side, supportive; pt educated on pressure relief techniques and repositioning when in recliner but pt declined to practice at this time reporting i'm always shifting around.  Incentive spirometer utilized, pt with poor carryover of technique; educated on 10x/hr and spreading out use if needed for tolerance.   Exercises     Shoulder Instructions      Home Living Family/patient expects to be discharged to:: Private residence Living Arrangements: Other relatives (sister) Available Help at Discharge: Family Type of Home: House Home Access: Level entry     Home Layout: One level     Bathroom Shower/Tub: Chief Strategy Officer: Standard     Home Equipment: Agricultural consultant (2 wheels);Cane - single point   Additional Comments: some info taken from prior admission      Prior Functioning/Environment Prior Level of Function : Independent/Modified Independent             Mobility Comments: RW or no device recently per pt. ADLs Comments: sister has been assisting meal prep/cooking  since last admssion; reports ind with ADLs    OT Problem List: Decreased strength;Decreased activity tolerance;Impaired balance (sitting and/or standing);Pain;Decreased knowledge of use of DME or AE;Decreased safety awareness;Decreased coordination;Decreased knowledge of precautions   OT Treatment/Interventions:  Self-care/ADL training;Therapeutic exercise;DME and/or AE instruction;Energy conservation;Therapeutic activities;Cognitive remediation/compensation;Patient/family education;Balance training      OT Goals(Current goals can be found in the care plan section)   Acute Rehab OT Goals Patient Stated Goal: less pain OT Goal Formulation: With patient Time For Goal Achievement: 05/03/24 Potential to Achieve Goals: Fair   OT Frequency:  Min 2X/week    Co-evaluation              AM-PAC OT 6 Clicks Daily Activity     Outcome Measure Help from another person eating meals?: A Little Help from another person taking care of personal grooming?: A Little Help from another person toileting, which includes using toliet, bedpan, or urinal?: A Lot Help from another person bathing (including washing, rinsing, drying)?: A Lot Help from another person to put on and taking off regular upper body clothing?: A Little Help from another person to put on and taking off regular lower body clothing?: A Lot 6 Click Score: 15   End of Session Nurse Communication: Mobility status;Precautions  Activity Tolerance: Patient limited by pain Patient left: in chair;with call bell/phone within reach;with family/visitor present  OT Visit Diagnosis: Other abnormalities of gait and mobility (R26.89);Muscle weakness (generalized) (M62.81);Pain Pain - part of body:  (  abd)                Time: 8656-8643 OT Time Calculation (min): 13 min Charges:  OT General Charges $OT Visit: 1 Visit OT Evaluation $OT Eval Moderate Complexity: 1 Mod  Etta NOVAK, OT Acute Rehabilitation Services Office [phone] Secure Chat Preferred    Etta GORMAN [place] 04/19/2024, 2:19 PM

## 2024-04-19 NOTE — Progress Notes (Signed)
 Rapid Response Event Note   Reason for Call : pt has a temperature 102, chills and c/o abdominal pain.   Initial Focused Assessment: pt A/O, but very straight forward about his care.  As I entered the room is was very hot and the pt did not want me to adjust the heat.  Explained to him due to his temperature we needed to turn the heat in his room down and  he refused.  He told me that he was cold natured.  Pt c/o pain in his stomach. Dressing intact to his abdomen.  Pt abdomen is tender but not tight.  Pt is doing his IS as I talk to him about how he feels.  Pulses intact.  Lungs decreased.     Interventions: Spoke with MD on call for [facility] regarding pt status and sepsis protocol.  MD stated that they would address pt this morning regarding his status.  VS reported to MD.  Informed pt primary RN to f/u on pt pain issues.     Plan of Care: Continue to monitor and report.  Event Summary:   MD Notified: yes   Keante Urizar Lavern, RN

## 2024-04-19 NOTE — Plan of Care (Signed)
 Pt c/o pain, n/v and chills for the entirety of the shift despite interventions. Rapid Response called at 0507. Pt remains in Red mews. Will continue to monitor.

## 2024-04-19 NOTE — Progress Notes (Signed)
 6 Days Post-Op   Subjective/Chief Complaint: Abd pain better Has tolerated FLD without n/v Having liquid ostomy output  Spiking temps to 102; tachy at times Reported that he threw up some phlegm this morning but it was not bilious Not ill-appearing  Nurse yesterday during dayshift was concerned about potential greenish drainage in the midline at the lower aspect of the midline wound  Objective: Vital signs in last 24 hours: Temp:  [97.6 F (36.4 C)-102.9 F (39.4 C)] 97.6 F (36.4 C) (07/20 0918) Pulse Rate:  [96-129] 96 (07/20 0918) Resp:  [18-24] 18 (07/20 0918) BP: (112-149)/(67-85) 112/69 (07/20 0918) SpO2:  [90 %-100 %] 98 % (07/20 0918) Weight:  [70.5 kg] 70.5 kg (07/20 0500) Last BM Date : 04/19/24  Intake/Output from previous day: 07/19 0701 - 07/20 0700 In: 1560.1 [P.O.:480; I.V.:980.1; IV Piggyback:100] Out: 2690 [Urine:1650; Stool:1040] Intake/Output this shift: Total I/O In: 120 [P.O.:120] Out: 925 [Urine:425; Stool:500]  Awake, alert, nontoxic Symm chest rise but not taking deep breathes; IS about 1200 Mild tachy Some distension, liquid stool in bag; some serosang drainage on midline wound, approp TTP Midline wound ok- fascia intact but pulling apart in 1 location; no enteric drainage from midline fascia   No edema; no calf TTP   Lab Results:  Recent Labs    04/18/24 0529 04/19/24 0457  WBC 10.1 12.1*  HGB 7.8* 8.7*  HCT 24.2* 27.1*  PLT 258 323   BMET Recent Labs    04/18/24 0529 04/19/24 0457  NA 141 140  K 2.9* 3.3*  CL 111 108  CO2 23 22  GLUCOSE 114* 102*  BUN 6* 8  CREATININE 0.72 0.74  CALCIUM  8.6* 8.5*   PT/INR Recent Labs    04/18/24 0529 04/19/24 0457  LABPROT 16.7* 24.1*  INR 1.3* 2.0*   ABG No results for input(s): PHART, HCO3 in the last 72 hours.  Invalid input(s): PCO2, PO2   Studies/Results: DG CHEST PORT 1 VIEW Result Date: 04/19/2024 CLINICAL DATA:  Fever EXAM: PORTABLE CHEST 1 VIEW COMPARISON:   X-ray 02/01/2024 FINDINGS: Underinflation. Enlarged cardiopericardial silhouette with calcified aorta. Prominent central vasculature. Mild opacity seen along both lung bases, right-greater-than-left. Left areas similar to previous. The right is new. Acute infiltrates possible. No pneumothorax or effusion. Old right-sided rib fractures. Kyphotic x-ray SI rotated to the right. Limiting evaluation the right lung apex. IMPRESSION: Limited x-ray. Under penetrated and kyphotic. Increasing right lung base opacity. Infiltrates possible. Recommend follow-up. Enlarged heart with prominence of central vasculature. Electronically Signed   By: Ranell Bring M.D.   On: 04/19/2024 10:55     Anti-infectives: Anti-infectives (From admission, onward)    Start     Dose/Rate Route Frequency Ordered Stop   04/13/24 0900  cefoTEtan  (CEFOTAN ) 2 g in sodium chloride  0.9 % 100 mL IVPB        2 g 200 mL/hr over 30 Minutes Intravenous On call to O.R. 04/10/24 0838 04/13/24 1109       Assessment/Plan: history of right colectomy with end ileostomy for perforated bleeding diverticulitis April 2025  abdominal dehiscence with exposed bowel   s/p Procedure(s): ILEOSTOMY TAKEDOWN (N/A) LYSIS, ADHESIONS 2 hours (N/A) SIGMOIDCOLECTOMY WITH DIVERTING LOOP ILEOSTOMY (N/A) LAPAROTOMY, EXPLORATORY (N/A) SPLENIC FLEXURE MOBILIZATION 04/13/24 EW  Wet - dry dressing to midline daily  GI -see below, ostomy teaching; I do not see any enteric drainage from the midline.  When I have seen green drainage on the midline it is been on the superficial layer of the dressing  not on the base layer-probably leakage from his ileostomy into the midline wound.  Right now the fascia is intact with a little bit of separation.  There is no signs of enteric drainage. FEN - change mIVF to NS w/KCL, hypokalemia - replace potassium, check mag level;  soft diet/protein shakes Pulm - incentive spirometry, OOB Heme - hgb down some hgb  10.2-->8.4-->8.1-->7.8--8.7,      H/o VTE - hep gtt stopped, switched to therapeutic Lovenox ; pharmacist stopped therapeutic Lovenox  today,  INR 2 Coumadin  restarted, mgmt per pharm Foley - none Pain -scheduled oral tylenol , , as needed oxycodone  and IV Dilaudid ;  scheduled low-dose gabapentin ; will decrease prn frequency of dilaudid  Pt/ot eval  ID/Fever-patient started spiking temperatures day before yesterday.  He has had intermittent tachycardia ever since surgery.  Will check chest x-ray and urinalysis.  If those are negative we will consider CT scan of abdomen and pelvis; since portable chest x-ray was limited we will send for formal two-view chest x-ray downstairs; I think the bump in white count today is probably hemoconcentrated since his hemoglobin went up a whole point as well  Dispo-febrile but wbc ok. Will cont to monitor temp curve.   Appreciate TRH assist on this pt  Alexander [name]. Tanda, MD, FACS General, Bariatric, & Minimally Invasive Surgery [facility]  [facility]   LOS: 10 days    Alexander Alexander [name] 04/19/2024

## 2024-04-19 NOTE — Progress Notes (Signed)
 Had NT place ice packs under arms and behind neck to help with elevated temp as Tylenol  was not able to be given yet, Primary RN alerted on-call provider for [facility] about patient vitals/condition, will continue to monitor vitals per mews protocol and implement interventions when able to.

## 2024-04-20 LAB — BASIC METABOLIC PANEL WITH GFR
Anion gap: 9 (ref 5–15)
BUN: 11 mg/dL (ref 8–23)
CO2: 21 mmol/L — ABNORMAL LOW (ref 22–32)
Calcium: 8.1 mg/dL — ABNORMAL LOW (ref 8.9–10.3)
Chloride: 108 mmol/L (ref 98–111)
Creatinine, Ser: 0.71 mg/dL (ref 0.61–1.24)
GFR, Estimated: >60 mL/min (ref >60)
Glucose, Bld: 156 mg/dL — ABNORMAL HIGH (ref 70–99)
Potassium: 3.1 mmol/L — ABNORMAL LOW (ref 3.5–5.1)
Sodium: 138 mmol/L (ref 135–145)

## 2024-04-20 LAB — CBC
HCT: 23.6 % — ABNORMAL LOW (ref 39.0–52.0)
Hemoglobin: 7.4 g/dL — ABNORMAL LOW (ref 13.0–17.0)
MCH: 30.5 pg (ref 26.0–34.0)
MCHC: 31.4 g/dL (ref 30.0–36.0)
MCV: 97.1 fL (ref 80.0–100.0)
Platelets: 304 K/uL (ref 150–400)
RBC: 2.43 MIL/uL — ABNORMAL LOW (ref 4.22–5.81)
RDW: 16.6 % — ABNORMAL HIGH (ref 11.5–15.5)
WBC: 9.8 K/uL (ref 4.0–10.5)
nRBC: 0.4 % — ABNORMAL HIGH (ref 0.0–0.2)

## 2024-04-20 LAB — MAGNESIUM: Magnesium: 2.1 mg/dL (ref 1.7–2.4)

## 2024-04-20 LAB — EXPECTORATED SPUTUM ASSESSMENT W GRAM STAIN, RFLX TO RESP C

## 2024-04-20 LAB — PROTIME-INR
INR: 3 — ABNORMAL HIGH (ref 0.8–1.2)
Prothrombin Time: 32.4 s — ABNORMAL HIGH (ref 11.4–15.2)

## 2024-04-20 MED ORDER — POTASSIUM CHLORIDE CRYS ER 20 MEQ PO TBCR
40.0000 meq | EXTENDED_RELEASE_TABLET | Freq: Once | ORAL | Status: AC
  Administered 2024-04-20: 40 meq via ORAL
  Filled 2024-04-20: qty 2

## 2024-04-20 MED ORDER — ENSURE PLUS HIGH PROTEIN PO LIQD
237.0000 mL | Freq: Two times a day (BID) | ORAL | Status: DC
  Administered 2024-04-21: 237 mL via ORAL

## 2024-04-20 MED ORDER — POTASSIUM CHLORIDE IN NACL 20-0.9 MEQ/L-% IV SOLN
INTRAVENOUS | Status: AC
  Filled 2024-04-20 (×2): qty 1000

## 2024-04-20 MED ORDER — JUVEN PO PACK
1.0000 | PACK | Freq: Two times a day (BID) | ORAL | Status: DC
  Administered 2024-04-20 – 2024-04-21 (×2): 1 via ORAL
  Filled 2024-04-20 (×3): qty 1

## 2024-04-20 MED ORDER — ADULT MULTIVITAMIN W/MINERALS CH
1.0000 | ORAL_TABLET | Freq: Every day | ORAL | Status: DC
  Administered 2024-04-20 – 2024-04-30 (×11): 1 via ORAL
  Filled 2024-04-20 (×11): qty 1

## 2024-04-20 NOTE — Progress Notes (Signed)
 PHARMACY - ANTICOAGULATION CONSULT NOTE  Pharmacy Consult for Warfarin Indication: history of VTE  No Known Allergies  Patient Measurements: Height: 5' 9 (175.3 cm) Weight: 71.3 kg (157 lb 3 oz) IBW/kg (Calculated) : 70.7 HEPARIN  DW (KG): 72.8  Vital Signs: Temp: 98.2 F (36.8 C) (07/21 0435) Temp Source: Oral (07/21 0435) BP: 122/78 (07/21 0435) Pulse Rate: 84 (07/21 0435)  Labs: Recent Labs    04/18/24 0529 04/19/24 0457 04/20/24 0439  HGB 7.8* 8.7* 7.4*  HCT 24.2* 27.1* 23.6*  PLT 258 323 304  LABPROT 16.7* 24.1* 32.4*  INR 1.3* 2.0* 3.0*  CREATININE 0.72 0.74 0.71    Estimated Creatinine Clearance: 95.7 mL/min (by C-G formula based on SCr of 0.71 mg/dL).  Medications: Info obtained from [facility] clinic note on 04/01/24: -INR goal: 2-3 -Home dose: Warfarin 7.5 mg PO daily  Assessment: Pt is a 33 yoM who is chronically anticoagulated with warfarin for history of VTE. PMH significant for emergent right colectomy with end ileostomy for perforated diverticulitis in April 2025 with post-op bleeding complications. Pt underwent ex lap with ileostomy takedown, sigmoid colectomy with diverting loop ileostomy on 04/13/24.   Pt was bridged peri-operatively with UFH while warfarin was on hold. Warfarin was resumed on 7/18 - pharmacy consulted to dose.   Significant Events/Anticoag History this Admission: 7/13: s/p colonoscopy 7/14: s/p ileostomy takedown, sigmoid colectomy with diverting loop ileostomy.    -Bridged with UFH perioperatively. On 7/17, UFH transitioned to LMWH bridge due to difficulty obtaining blood for lab monitoring.   Today, 04/20/24 INR = 3 is therapeutic on upper end of therapeutic range, however, significant increase from yesterday.  CBC: Hgb (7.4) low and decreased, but similar to 7/19 labs. Plt remain WNL D/w RN - no signs of bleeding at this time Major DDI: None Diet: Soft, 25% meal intake charted  Goal of Therapy:  INR 2-3   Plan:  Hold warfarin  today given jump in INR CBC, INR with AM labs tomorrow  Ronal CHRISTELLA Rav, PharmD 04/20/24 9:15 AM

## 2024-04-20 NOTE — Plan of Care (Signed)

## 2024-04-20 NOTE — NC FL2 (Signed)
 [place]  MEDICAID FL2 LEVEL OF CARE FORM     IDENTIFICATION  Patient Name: Alexander [name] Birthdate: 03/09/1961 Sex: male Admission Date (Current Location): 04/09/2024  [facility] and [place] Number:  Producer, television/film/video and Address:  [facility],  [address] 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   Colitis 04/12/2024   History of partial colectomy 04/11/2024   Generalized abdominal pain 04/11/2024   Colon cancer screening 04/11/2024   Abdominal wall dehiscence 04/09/2024   Perioperative dehiscence of abdominal wound with evisceration 04/03/2024   Ileostomy in place [facility]) 03/30/2024   Ileostomy care (HCC) 03/20/2024   Irritant contact dermatitis associated with fecal stoma 03/20/2024   Spontaneous perforation of colon (HCC) 01/15/2024   Ascites 01/15/2024   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 01/15/2024   Acute blood loss anemia 01/14/2024   AKI (acute kidney injury) (HCC) 01/11/2024   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   Thrombocytopenia (HCC) 07/13/2023   Pulmonary hypertension (HCC) 07/12/2023   Cor pulmonale (HCC) 07/12/2023   Noncompliance 01/22/2020   High risk medication use 10/30/2019   Hyperlipidemia 10/30/2019   Hepatic steatosis 10/08/2019   Spinal stenosis 10/08/2019   Aortic atherosclerosis (HCC) 10/08/2019   Peripheral arterial disease (HCC) 10/08/2019   Sleep disturbance 10/08/2019   Diverticulosis 10/08/2019   Essential hypertension, benign 09/28/2019   Gout 09/28/2019    History of pulmonary embolus (PE) 04/20/2019   Chronic pain of right ankle 12/12/2016   Chronic foot pain, right 11/02/2012    Orientation RESPIRATION BLADDER Height & Weight     Self, Time, Situation, Place  Normal Incontinent, External catheter, Continent Weight: 71.3 kg Height:  5' 9 (175.3 cm)  BEHAVIORAL SYMPTOMS/MOOD NEUROLOGICAL BOWEL NUTRITION STATUS      Ileostomy    AMBULATORY STATUS COMMUNICATION OF NEEDS Skin   Extensive Assist   Other (Comment), Wound Vac (lower abdominal wound dehisced , plan for placement of wound vac)                       Personal Care Assistance Level of Assistance  Bathing, Feeding, Dressing Bathing Assistance: Limited assistance Feeding assistance: Limited assistance Dressing Assistance: Limited assistance     Functional Limitations Info  Sight, Hearing, Speech Sight Info: Adequate Hearing Info: Adequate Speech Info: Adequate    SPECIAL CARE FACTORS FREQUENCY  PT (By licensed PT), OT (By licensed OT)     PT Frequency: 5x/wk OT Frequency: 5x/wk            Contractures Contractures Info: Not present    Additional Factors Info  Code Status, Allergies, Psychotropic Code Status Info: Full Code Allergies Info: NKA Psychotropic Info: N/A         Current Medications (04/20/2024):  This is the current hospital active medication list Current Facility-Administered Medications  Medication Dose Route Frequency Provider Last Rate Last Admin   0.9 % NaCl with KCl 20 mEq/ L  infusion   Intravenous Continuous Tanda Locus, MD 50 mL/hr at 04/20/24 0606 Infusion Verify at 04/20/24  9393   acetaminophen  (TYLENOL ) tablet 1,000 mg  1,000 mg Oral Q8H Tanda Locus, MD   1,000 mg at 04/20/24 9472   Chlorhexidine  Gluconate Cloth 2 % PADS 6 each  6 each Topical Daily Briana Elgin LABOR, MD   6 each at 04/20/24 9187   diphenhydrAMINE  (BENADRYL ) injection 12.5 mg  12.5 mg Intravenous Q6H PRN Tanda Locus, MD   12.5 mg at 04/14/24 1829   Or    diphenhydrAMINE  (BENADRYL ) 12.5 MG/5ML elixir 12.5 mg  12.5 mg Oral Q6H PRN Tanda Locus, MD       feeding supplement (BOOST / RESOURCE BREEZE) liquid 1 Container  1 Container Oral BID BM Tanda Locus, MD   1 Container at 04/19/24 1429   gabapentin  (NEURONTIN ) capsule 100 mg  100 mg Oral TID Tanda Locus, MD   100 mg at 04/20/24 0813   HYDROmorphone  (DILAUDID ) injection 0.5 mg  0.5 mg Intravenous Q4H PRN Tanda Locus, MD   0.5 mg at 04/20/24 9185   methocarbamol  (ROBAXIN ) injection 500 mg  500 mg Intravenous Q8H PRN Tanda Locus, MD   500 mg at 04/19/24 9152   mupirocin  ointment (BACTROBAN ) 2 % 1 Application  1 Application Nasal BID Briana Elgin LABOR, MD   1 Application at 04/20/24 0815   naloxone  (NARCAN ) injection 0.4 mg  0.4 mg Intravenous PRN Tanda Locus, MD       And   sodium chloride  flush (NS) 0.9 % injection 9 mL  9 mL Intravenous PRN Tanda Locus, MD       ondansetron  (ZOFRAN ) injection 4 mg  4 mg Intravenous Q6H PRN Tanda Locus, MD   4 mg at 04/19/24 0341   oxyCODONE  (Oxy IR/ROXICODONE ) immediate release tablet 5-10 mg  5-10 mg Oral Q4H PRN Tanda Locus, MD   10 mg at 04/20/24 0941   phenol (CHLORASEPTIC) mouth spray 1 spray  1 spray Mouth/Throat PRN Cheryle Page, MD   1 spray at 04/14/24 0315   promethazine  (PHENERGAN ) 12.5 mg in sodium chloride  0.9 % 50 mL IVPB  12.5 mg Intravenous Q6H PRN Tanda Locus, MD       traZODone  (DESYREL ) tablet 100 mg  100 mg Oral QHS Wilson, Eric, MD   100 mg at 04/19/24 2112   Warfarin - Pharmacist Dosing Inpatient   Does not apply q1600 James, Melissa, RPH         Discharge Medications: Please see discharge summary for a list of discharge medications.  Relevant Imaging Results:  Relevant Lab Results:   Additional Information SSN: [id number]  Alfonse JONELLE Rex, RN

## 2024-04-20 NOTE — Progress Notes (Addendum)
 7 Days Post-Op   Subjective/Chief Complaint: Abd pain better Has tolerated soft without n/v Having liquid ostomy output Says he ate spaghetti and drank at least one shake  No fevers x 24 hrs  Objective: Vital signs in last 24 hours: Temp:  [97.6 F (36.4 C)-98.2 F (36.8 C)] 98.2 F (36.8 C) (07/21 0435) Pulse Rate:  [75-98] 84 (07/21 0435) Resp:  [14-18] 14 (07/21 0435) BP: (102-127)/(66-78) 122/78 (07/21 0435) SpO2:  [98 %-100 %] 99 % (07/21 0435) Weight:  [71.3 kg] 71.3 kg (07/21 0449) Last BM Date : 04/19/24  Intake/Output from previous day: 07/20 0701 - 07/21 0700 In: 2433.4 [P.O.:1440; I.V.:993.4] Out: 2425 [Urine:1075; Stool:1350] Intake/Output this shift: No intake/output data recorded.  Awake, alert, nontoxic Symm chest rise  doing great with IS Mild tachy Some distension, liquid stool in bag; some serosang drainage on midline wound, approp TTP Midline wound ok- fascia intact but pulling apart in 1 location; no enteric drainage from midline fascia   No edema; no calf TTP   Lab Results:  Recent Labs    04/19/24 0457 04/20/24 0439  WBC 12.1* 9.8  HGB 8.7* 7.4*  HCT 27.1* 23.6*  PLT 323 304   BMET Recent Labs    04/19/24 0457 04/20/24 0439  NA 140 138  K 3.3* 3.1*  CL 108 108  CO2 22 21*  GLUCOSE 102* 156*  BUN 8 11  CREATININE 0.74 0.71  CALCIUM  8.5* 8.1*   PT/INR Recent Labs    04/19/24 0457 04/20/24 0439  LABPROT 24.1* 32.4*  INR 2.0* 3.0*   ABG No results for input(s): PHART, HCO3 in the last 72 hours.  Invalid input(s): PCO2, PO2   Studies/Results: DG Chest 2 View Result Date: 04/19/2024 CLINICAL DATA:  [id number] Fever [id number] EXAM: CHEST - 2 VIEW COMPARISON:  Chest x-ray 04/19/2024 10:07 a.m. FINDINGS: The heart and mediastinal contours are unchanged. Atherosclerotic plaque. Slightly improved aeration of the right lung with persistent patchy airspace opacity of the right mid lower lung zone. Left base atelectasis. No  pulmonary edema. No pleural effusion. No pneumothorax. No acute osseous abnormality. IMPRESSION: 1. Slightly improved aeration of the right lung with persistent right lung multifocal pneumonia. Followup PA and lateral chest X-ray is recommended in 3-4 weeks following therapy to ensure resolution and exclude underlying malignancy. 2.  Aortic Atherosclerosis (ICD10-I70.0). Electronically Signed   By: Morgane  Naveau M.D.   On: 04/19/2024 19:25   DG CHEST PORT 1 VIEW Result Date: 04/19/2024 CLINICAL DATA:  Fever EXAM: PORTABLE CHEST 1 VIEW COMPARISON:  X-ray 02/01/2024 FINDINGS: Underinflation. Enlarged cardiopericardial silhouette with calcified aorta. Prominent central vasculature. Mild opacity seen along both lung bases, right-greater-than-left. Left areas similar to previous. The right is new. Acute infiltrates possible. No pneumothorax or effusion. Old right-sided rib fractures. Kyphotic x-ray SI rotated to the right. Limiting evaluation the right lung apex. IMPRESSION: Limited x-ray. Under penetrated and kyphotic. Increasing right lung base opacity. Infiltrates possible. Recommend follow-up. Enlarged heart with prominence of central vasculature. Electronically Signed   By: Ranell Bring M.D.   On: 04/19/2024 10:55     Anti-infectives: Anti-infectives (From admission, onward)    Start     Dose/Rate Route Frequency Ordered Stop   04/13/24 0900  cefoTEtan  (CEFOTAN ) 2 g in sodium chloride  0.9 % 100 mL IVPB        2 g 200 mL/hr over 30 Minutes Intravenous On call to O.R. 04/10/24 0838 04/13/24 1109       Assessment/Plan: history of right colectomy  with end ileostomy for perforated bleeding diverticulitis April 2025  abdominal dehiscence with exposed bowel   s/p Procedure(s): ILEOSTOMY TAKEDOWN (N/A) LYSIS, ADHESIONS 2 hours (N/A) SIGMOIDCOLECTOMY WITH DIVERTING LOOP ILEOSTOMY (N/A) LAPAROTOMY, EXPLORATORY (N/A) SPLENIC FLEXURE MOBILIZATION 04/13/24 EW  Wet - dry dressing to midline  daily  GI -see below, ostomy teaching; Right now the fascia is intact with a little bit of separation.  There is no signs of enteric drainage. Will ask WOC nurse about placing wound vac on midline -  wondering about a piece of mepital with white foam on that section and then black sponge)  FEN - changed mIVF to NS w/KCL, hypokalemia - replace potassium,  soft diet/protein shakes Pulm - incentive spirometry, OOB Heme - hgb down some hgb 10.2-->8.4-->8.1-->7.8--8.7-->7.4, repeat cbc in am      H/o VTE - hep gtt stopped, switched to therapeutic Lovenox ; pharmacist stopped therapeutic Lovenox  today,  INR 3 on  Coumadin , mgmt per pharm Foley - none Pain -scheduled oral tylenol , , as needed oxycodone  and IV Dilaudid ;  scheduled low-dose gabapentin ; will decrease prn frequency of dilaudid  Pt/ot eval  ID/Fever-patient started spiking temperatures day on Friday.  Ua negative. CXR showed infiltrate - Right; now afebrile x 24hrs without tachy; the small bump in WBC on Sunday I think was due to hemoconcentration given change in hgb/hct. Will hold off on abx for now but will get sputum culture  Dispo-start looking for SNF. Pt really hasn't been participating in his wound care. Re-engage TOC.   I'm unavailable the rest of the day. Have discussed with our DOW surgeon and PAs. D/w bedside nurse.   [name], MD, FACS General, Bariatric, & Minimally Invasive Surgery [facility],  A [facility]   LOS: 11 days    Camellia Tanda 04/20/2024

## 2024-04-20 NOTE — Consult Note (Signed)
 WOC consulted for NPWT to midline (Mepitel/white foam/black foam). Will attempt to place today, supplies ordered.   Michie Molnar [facility], CNS, CWON-AP [phone]

## 2024-04-20 NOTE — Progress Notes (Signed)
 Initial Nutrition Assessment  INTERVENTION:   -48 hour calorie count to start 7/21 at breakfast and run through 7/23 at breakfast  - Please document % intake of all foods, drinks, and nutrition supplements patient consumes on meal tickets and place in envelope on patient's door.  - If patient skips/refuses meals please document 0% for that meal.  -Ensure Plus High Protein po BID, each supplement provides 350 kcal and 20 grams of protein.   -1 packet Juven BID, each packet provides 95 calories, 2.5 grams of protein (collagen), and 9.8 grams of carbohydrate (3 grams sugar); also contains 7 grams of L-arginine and L-glutamine, 300 mg vitamin C, 15 mg vitamin E, 1.2 mcg vitamin B-12, 9.5 mg zinc , 200 mg calcium , and 1.5 g  Calcium  Beta-hydroxy-Beta-methylbutyrate to support wound healing  -Multivitamin with minerals daily  NUTRITION DIAGNOSIS:   Increased nutrient needs related to post-op healing as evidenced by estimated needs.  GOAL:   Patient will meet greater than or equal to 90% of their needs  MONITOR:   PO intake, Supplement acceptance  REASON FOR ASSESSMENT:   Consult Calorie Count  ASSESSMENT:   63 y.o. male with a history of pulmonary embolism, aortic atherosclerosis, peripheral artery disease, diverticulosis, diverticulitis, gout, GERD, hypertension, pulmonary hypertension, right colectomy with end ileostomy.  Patient presented from his general surgeons office secondary to abdominal wall wound dehiscence requiring surgical management.  Patient underwent exploratory laparotomy on 7/14 with ileostomy takedown, lysis of adhesions, sigmoid colectomy, diverting loop ileostomy.  7/10: admitted 7/12: CLD -> FLD 7/13: NPO -> FLD, s/p colonoscopy 7/14: NPO, s/p ileostomy takedown, LOA, sigmoid colectomy, diverting loop ileostomy 7/16: CLD 7/17: FLD 7/19: Soft diet  Patient talking to someone on the phone during visit and asks RD to come back at later time. Will gather history  next date when following up on Calorie count. Pt aware calorie count starting today. Envelope placed on door. Pt consumed 100% of breakfast so far today.  Has been ordered [facility] but will switch this to Ensure for more kcals and protein. Will also add MVI and Juven for wound healing.  Admission weight: 156 lbs Current weight: 157 lbs  Medications: KLOR-CON   Labs reviewed: Low K   NUTRITION - FOCUSED PHYSICAL EXAM:  Unable to complete  Diet Order:   Diet Order             DIET SOFT Fluid consistency: Thin  Diet effective now                   EDUCATION NEEDS:   Not appropriate for education at this time  Skin:  Skin Assessment: Skin Integrity Issues: Skin Integrity Issues:: Other (Comment) Other: surgical wound -abdomen  Last BM:  7/21 -ileostomy  Height:   Ht Readings from Last 1 Encounters:  04/13/24 5' 9 (1.753 m)    Weight:   Wt Readings from Last 1 Encounters:  04/20/24 71.3 kg    BMI:  Body mass index is 23.21 kg/m.  Estimated Nutritional Needs:   Kcal:  2150-2350  Protein:  110-130g  Fluid:  2L/day   Morna Lee, MS, RD, LDN Inpatient Clinical Dietitian Contact via Secure chat

## 2024-04-20 NOTE — TOC Progression Note (Signed)
 Transition of Care [facility]) - Progression Note    Patient Details  Name: Laderrick Wilk MRN: [id number] Date of Birth: 17-Jun-1961  [facility] CM/SW Contact  Alfonse JONELLE Rex, RN Phone Number: 04/20/2024, 12:37 PM  Clinical Narrative:   PT eval , recommendation changed to short term rehab/SNF, pt agreeable, prefers [facility] [employer]. FL2 updated, faxed out for bed offers.     Expected Discharge Plan: Home/Self Care Barriers to Discharge: Continued Medical Work up  Expected Discharge Plan and Services   Discharge Planning Services: CM Consult   Living arrangements for the past 2 months: Single Family Home                                       Social Determinants of Health (SDOH) Interventions SDOH Screenings   Food Insecurity: No Food Insecurity (04/10/2024)  Housing: High Risk (04/10/2024)  Transportation Needs: No Transportation Needs (04/10/2024)  Recent Concern: Transportation Needs - Unmet Transportation Needs (03/30/2024)  Utilities: Not At Risk (04/10/2024)  Depression (PHQ2-9): Low Risk  (07/24/2023)  Tobacco Use: Medium Risk (04/13/2024)    Readmission Risk Interventions    04/10/2024    1:55 PM 01/24/2024   10:43 AM 01/13/2024    9:13 AM  Readmission Risk Prevention Plan  Transportation Screening Complete Complete Complete  PCP or Specialist Appt within 3-5 Days  Complete Complete  HRI or Home Care Consult  Complete Complete  Social Work Consult for Recovery Care Planning/Counseling  Complete Complete  Palliative Care Screening  Not Applicable Not Applicable  Medication Review Oceanographer) Complete Complete Complete  PCP or Specialist appointment within 3-5 days of discharge Complete    HRI or Home Care Consult Complete    SW Recovery Care/Counseling Consult Complete    Palliative Care Screening Not Applicable    Skilled Nursing Facility Not Applicable

## 2024-04-20 NOTE — Consult Note (Addendum)
 WOC Nurse Consult Note: Reason for Consult: placement of NPWT to the midline  Wound type: surgical  Pressure Injury POA: NA Measurement: 18cm x 6cm x 4cm  Wound bed:50% yellow/black slough; slick wound bed otherwise Drainage (amount, consistency, odor) blue-green drainage with odor noted when WOC entered the room;odor outside the room as well;  soupy drainage distally with brown color;  Periwound: intact Dressing procedure/placement/frequency:  Filled wound with  __1_ piece of Mepitel, _1___ piece of white foam, __1__ piece of black foam  Sealed NPWT dressing at HG Patient received PO pain medication per bedside nurse prior to dressing change Patient tolerated procedure well   WOC nurse will continue to provide NPWT dressing changed due to the complexity of the dressing change.       WOC Nurse ostomy follow up Stoma type/location: RLQ, loop ileostomy  Stomal assessment/size: 1 1/2, skin level, red rubber support in place  Peristomal assessment: intact Treatment options for stomal/peristomal skin: 2 barrier ring Output liquid brown Ostomy pouching: 1pc.soft convex with barrier ring Education provided: patient has been managing a stoma at home previously  Enrolled patient in [facility] Discharge program: Yes, previously   Lakeyn Dokken [facility], CNS, [employer] [phone]

## 2024-04-21 LAB — MAGNESIUM: Magnesium: 1.9 mg/dL (ref 1.7–2.4)

## 2024-04-21 LAB — CBC
HCT: 24.3 % — ABNORMAL LOW (ref 39.0–52.0)
Hemoglobin: 7.5 g/dL — ABNORMAL LOW (ref 13.0–17.0)
MCH: 29.9 pg (ref 26.0–34.0)
MCHC: 30.9 g/dL (ref 30.0–36.0)
MCV: 96.8 fL (ref 80.0–100.0)
Platelets: 429 K/uL — ABNORMAL HIGH (ref 150–400)
RBC: 2.51 MIL/uL — ABNORMAL LOW (ref 4.22–5.81)
RDW: 16.5 % — ABNORMAL HIGH (ref 11.5–15.5)
WBC: 9.9 K/uL (ref 4.0–10.5)
nRBC: 0.4 % — ABNORMAL HIGH (ref 0.0–0.2)

## 2024-04-21 LAB — POTASSIUM: Potassium: 3.5 mmol/L (ref 3.5–5.1)

## 2024-04-21 LAB — PROTIME-INR
INR: 2.5 — ABNORMAL HIGH (ref 0.8–1.2)
Prothrombin Time: 28.5 s — ABNORMAL HIGH (ref 11.4–15.2)

## 2024-04-21 MED ORDER — WARFARIN SODIUM 4 MG PO TABS
4.0000 mg | ORAL_TABLET | Freq: Once | ORAL | Status: AC
  Administered 2024-04-21: 4 mg via ORAL
  Filled 2024-04-21: qty 1

## 2024-04-21 MED ORDER — GABAPENTIN 100 MG PO CAPS
200.0000 mg | ORAL_CAPSULE | Freq: Three times a day (TID) | ORAL | Status: DC
  Administered 2024-04-21 – 2024-04-30 (×27): 200 mg via ORAL
  Filled 2024-04-21 (×27): qty 2

## 2024-04-21 MED ORDER — MAGNESIUM SULFATE IN D5W 1-5 GM/100ML-% IV SOLN
1.0000 g | Freq: Once | INTRAVENOUS | Status: AC
  Administered 2024-04-21: 1 g via INTRAVENOUS
  Filled 2024-04-21: qty 100

## 2024-04-21 MED ORDER — WARFARIN SODIUM 2.5 MG PO TABS: 7.5000 mg | ORAL_TABLET | Freq: Once | ORAL | Status: DC

## 2024-04-21 MED ORDER — LOPERAMIDE HCL 2 MG PO CAPS
2.0000 mg | ORAL_CAPSULE | Freq: Two times a day (BID) | ORAL | Status: DC
  Administered 2024-04-21 – 2024-04-22 (×3): 2 mg via ORAL
  Filled 2024-04-21 (×3): qty 1

## 2024-04-21 NOTE — Plan of Care (Signed)

## 2024-04-21 NOTE — Progress Notes (Signed)
 8 Days Post-Op   Subjective/Chief Complaint: Asks for something stronger for abd wall pain Has tolerated soft without n/v Having liquid ostomy output  No fevers x 48hrs  Wound vac applied to midline yesterday Ostomy output up a little bit today  Objective: Vital signs in last 24 hours: Temp:  [97.9 F (36.6 C)-98.9 F (37.2 C)] 98.3 F (36.8 C) (07/22 0437) Pulse Rate:  [78-91] 83 (07/22 0437) Resp:  [16-18] 16 (07/22 0437) BP: (111-138)/(74-97) 130/74 (07/22 0437) SpO2:  [97 %-100 %] 99 % (07/22 0437) Weight:  [70.6 kg] 70.6 kg (07/22 0449) Last BM Date : 04/19/24  Intake/Output from previous day: 07/21 0701 - 07/22 0700 In: 1574.1 [P.O.:1320; I.V.:254.1] Out: 3650 [Urine:1750; Stool:1900] Intake/Output this shift: Total I/O In: 240 [P.O.:240] Out: 150 [Stool:150]  Awake, alert, nontoxic Symm chest rise  doing great with IS Mild tachy Some distension, liquid stool in bag; wound vac in place    No edema; no calf TTP   Lab Results:  Recent Labs    04/20/24 0439 04/21/24 0453  WBC 9.8 9.9  HGB 7.4* 7.5*  HCT 23.6* 24.3*  PLT 304 429*   BMET Recent Labs    04/19/24 0457 04/20/24 0439 04/21/24 0453  NA 140 138  --   K 3.3* 3.1* 3.5  CL 108 108  --   CO2 22 21*  --   GLUCOSE 102* 156*  --   BUN 8 11  --   CREATININE 0.74 0.71  --   CALCIUM  8.5* 8.1*  --    PT/INR Recent Labs    04/20/24 0439 04/21/24 0453  LABPROT 32.4* 28.5*  INR 3.0* 2.5*   ABG No results for input(s): PHART, HCO3 in the last 72 hours.  Invalid input(s): PCO2, PO2   Studies/Results: DG Chest 2 View Result Date: 04/19/2024 CLINICAL DATA:  [id number] Fever [id number] EXAM: CHEST - 2 VIEW COMPARISON:  Chest x-ray 04/19/2024 10:07 a.m. FINDINGS: The heart and mediastinal contours are unchanged. Atherosclerotic plaque. Slightly improved aeration of the right lung with persistent patchy airspace opacity of the right mid lower lung zone. Left base atelectasis. No pulmonary  edema. No pleural effusion. No pneumothorax. No acute osseous abnormality. IMPRESSION: 1. Slightly improved aeration of the right lung with persistent right lung multifocal pneumonia. Followup PA and lateral chest X-ray is recommended in 3-4 weeks following therapy to ensure resolution and exclude underlying malignancy. 2.  Aortic Atherosclerosis (ICD10-I70.0). Electronically Signed   By: Morgane  Naveau M.D.   On: 04/19/2024 19:25   DG CHEST PORT 1 VIEW Result Date: 04/19/2024 CLINICAL DATA:  Fever EXAM: PORTABLE CHEST 1 VIEW COMPARISON:  X-ray 02/01/2024 FINDINGS: Underinflation. Enlarged cardiopericardial silhouette with calcified aorta. Prominent central vasculature. Mild opacity seen along both lung bases, right-greater-than-left. Left areas similar to previous. The right is new. Acute infiltrates possible. No pneumothorax or effusion. Old right-sided rib fractures. Kyphotic x-ray SI rotated to the right. Limiting evaluation the right lung apex. IMPRESSION: Limited x-ray. Under penetrated and kyphotic. Increasing right lung base opacity. Infiltrates possible. Recommend follow-up. Enlarged heart with prominence of central vasculature. Electronically Signed   By: Ranell Bring M.D.   On: 04/19/2024 10:55     Anti-infectives: Anti-infectives (From admission, onward)    Start     Dose/Rate Route Frequency Ordered Stop   04/13/24 0900  cefoTEtan  (CEFOTAN ) 2 g in sodium chloride  0.9 % 100 mL IVPB        2 g 200 mL/hr over 30 Minutes Intravenous On call to  O.R. 04/10/24 0838 04/13/24 1109       Assessment/Plan: history of right colectomy with end ileostomy for perforated bleeding diverticulitis April 2025  abdominal dehiscence with exposed bowel   s/p Procedure(s): ILEOSTOMY TAKEDOWN (N/A) LYSIS, ADHESIONS 2 hours (N/A) SIGMOIDCOLECTOMY WITH DIVERTING LOOP ILEOSTOMY (N/A) LAPAROTOMY, EXPLORATORY (N/A) SPLENIC FLEXURE MOBILIZATION 04/13/24 EW  Wet - dry dressing to midline daily  GI -see  below, ostomy teaching; Right now the fascia is intact with a little bit of separation.  There is no signs of enteric drainage. Wound vac placed 7/21 on midline -  wondering about a piece of mepital with white foam on that section and then black sponge) . Can remove ostomy bridge; will start some low dose imodium  7/22 since ostomy output put out 1900 yesterday FEN - will KVO ivf today and see if pt keeps up with fluid intake, hypokalemia -resolved.  soft diet/protein shakes. Calorie count started 7/21 Pulm - incentive spirometry, OOB Heme - hgb down some hgb 10.2-->8.4-->8.1-->7.8--8.7-->7.4-->7.5, repeat cbc in am      H/o VTE - hep gtt stopped, switched to therapeutic Lovenox ; pharmacist stopped therapeutic Lovenox  today,  INR 2.5 on  Coumadin , mgmt per pharm Foley - none Pain -scheduled oral tylenol , , as needed oxycodone  and IV Dilaudid ;  scheduled low-dose gabapentin ; will decrease prn frequency of dilaudid ; increase dose of gabapentin  Pt/ot eval  ID/Fever-patient started spiking temperatures day on Friday.  Ua negative. CXR showed infiltrate - Right; now afebrile x 48hrs without tachy; the small bump in WBC on Sunday I think was due to hemoconcentration given change in hgb/hct. Will hold off on abx for now but will get sputum culture- cx P  Dispo-see above  [name], MD, FACS General, Bariatric, & Minimally Invasive Surgery [facility],  A [facility]   LOS: 12 days    Camellia Tanda 04/21/2024

## 2024-04-21 NOTE — Progress Notes (Signed)
 Calorie Count Note  48 hour calorie count ordered.  Diet: Soft Supplements:  -Ensure Plus High Protein po BID, each supplement provides 350 kcal and 20 grams of protein.  -Juven BID, each serving provides 95kcal and 2.5g of protein (amino acids  glutamine and arginine)   7/21: Breakfast: 100% = 500 kcals, 16g protein Lunch: didn't order anything Dinner: 170 kcals, 10g protein Supplements: none  Total intake: 670 kcal (31% of minimum estimated needs)  26g protein (23% of minimum estimated needs)  7/22: Breakfast: 560 kcals, 20g protein Lunch: pending Dinner: pending Supplements: pending  Total intake:  kcal (% of minimum estimated needs)   protein (% of minimum estimated needs)  Nutrition Dx: Increased nutrient needs related to post-op healing as evidenced by estimated needs.   Goal: Pt to meet >/= 90% of their estimated nutrition needs   Intervention:  -Calorie count -Continue Ensure BID -Multivitamin with minerals daily -D/c Juven per pt request, declines additional vitamin supplementation   Morna Lee, MS, RD, LDN Inpatient Clinical Dietitian Contact via Secure chat

## 2024-04-21 NOTE — TOC Progression Note (Signed)
 Transition of Care [facility]) - Progression Note    Patient Details  Name: Alexander [name] MRN: [id number] Date of Birth: [birth year]  [facility]) CM/SW Contact  Alfonse JONELLE Rex, RN Phone Number: 04/21/2024, 3:26 PM  Clinical Narrative:   Short term rehab/SNF bed offers presented to patient [name], [name]), patient accepted bed offer at [facility]. Camille w/[facility] and confirmed acceptance of short term rehab bed offer and confirmed facility can accommodate [employer] wound vac. SNF auth initiated via [employer], Ref#  [id number], auth pending. Team notified.     Expected Discharge Plan: Home/Self Care Barriers to Discharge: Continued Medical Work up  Expected Discharge Plan and Services   Discharge Planning Services: CM Consult   Living arrangements for the past 2 months: Single Family Home                                       Social Determinants of Health (SDOH) Interventions SDOH Screenings   Food Insecurity: No Food Insecurity (04/10/2024)  Housing: High Risk (04/10/2024)  Transportation Needs: No Transportation Needs (04/10/2024)  Recent Concern: Transportation Needs - Unmet Transportation Needs (03/30/2024)  Utilities: Not At Risk (04/10/2024)  Depression (PHQ2-9): Low Risk  (07/24/2023)  Tobacco Use: Medium Risk (04/13/2024)    Readmission Risk Interventions    04/10/2024    1:55 PM 01/24/2024   10:43 AM 01/13/2024    9:13 AM  Readmission Risk Prevention Plan  Transportation Screening Complete Complete Complete  PCP or Specialist Appt within 3-5 Days  Complete Complete  HRI or Home Care Consult  Complete Complete  Social Work Consult for Recovery Care Planning/Counseling  Complete Complete  Palliative Care Screening  Not Applicable Not Applicable  Medication Review Oceanographer) Complete Complete Complete  PCP or Specialist appointment within 3-5 days of discharge Complete    HRI or Home Care Consult Complete    SW Recovery  Care/Counseling Consult Complete    Palliative Care Screening Not Applicable    Skilled Nursing Facility Not Applicable

## 2024-04-21 NOTE — Progress Notes (Addendum)
 PHARMACY - ANTICOAGULATION CONSULT NOTE  Pharmacy Consult for Warfarin Indication: history of VTE  No Known Allergies  Patient Measurements: Height: 5' 9 (175.3 cm) Weight: 70.6 kg (155 lb 10.3 oz) IBW/kg (Calculated) : 70.7 HEPARIN  DW (KG): 72.8  Vital Signs: Temp: 98.3 F (36.8 C) (07/22 0437) Temp Source: Oral (07/22 0437) BP: 130/74 (07/22 0437) Pulse Rate: 83 (07/22 0437)  Labs: Recent Labs    04/19/24 0457 04/20/24 0439 04/21/24 0453  HGB 8.7* 7.4* 7.5*  HCT 27.1* 23.6* 24.3*  PLT 323 304 429*  LABPROT 24.1* 32.4* 28.5*  INR 2.0* 3.0* 2.5*  CREATININE 0.74 0.71  --     Estimated Creatinine Clearance: 95.6 mL/min (by C-G formula based on SCr of 0.71 mg/dL).  Medications: Info obtained from [facility] clinic note on 04/01/24: -INR goal: 2-3 -Home dose: Warfarin 7.5 mg PO daily  Assessment: Pt is a 80 yoM who is chronically anticoagulated with warfarin for history of VTE. PMH significant for emergent right colectomy with end ileostomy for perforated diverticulitis in April 2025 with post-op bleeding complications. Pt underwent ex lap with ileostomy takedown, sigmoid colectomy with diverting loop ileostomy on 04/13/24.   Pt was bridged peri-operatively with UFH while warfarin was on hold. Warfarin was resumed on 7/18 - pharmacy consulted to dose.   Significant Events/Anticoag History this Admission: 7/13: s/p colonoscopy 7/14: s/p ileostomy takedown, sigmoid colectomy with diverting loop ileostomy.    -Bridged with UFH perioperatively. On 7/17, UFH transitioned to LMWH bridge due to difficulty obtaining blood for lab monitoring.   Today, 04/21/24 INR = 2.5 - therapeutic; Will reduce warfarin dose today due to the rapid increase in INR since admission while on home regimen. High stool output of 1.3-1.9L over past 2 days would reduce Vit K absorption, reducing warfarin requirements. CBC: Hgb (7.5) low and decreased, but similar to 7/19 labs. Pltc (429) increased.  D/w RN:  no signs of bleeding at this time Major DDI: None Diet: Soft, No meal intake charted on 7/21  Goal of Therapy:  INR 2-3   Plan:  Warfarin 4mg  once  CBC, daily INR with AM labs  Thank you for allowing pharmacy to be a part of this patient's care.  Marget Hench, PharmD PGY1 Pharmacy Resident

## 2024-04-21 NOTE — Progress Notes (Signed)
 Physical Therapy Treatment Patient Details Name: Alexander [name] MRN: [id number] DOB: 07/14/1961 Today's Date: 04/21/2024   History of Present Illness 63 y.o. male  presented from his general surgeons office secondary to abdominal wall wound dehiscence requiring surgical management. Patient underwent exploratory laparotomy on 7/14 with ileostomy takedown, lysis of adhesions, sigmoid colectomy, diverting loop ileostomy.   PMH: pulmonary embolism, aortic atherosclerosis, peripheral artery disease, diverticulosis, diverticulitis, gout, GERD, hypertension, pulmonary hypertension, right colectomy with end ileostomy.    PT Comments  Pt in bed, needing ostomy bag emptied, requests IV and catheter disconnect. Pt progressing well requiring less assistance for bed mobility and transfers. R sided pain significant and limiting this session. He is able to complete Sit to Stand with min A and maintains CGA to reach chair with RW using step pivot transfer. Pt encouraged with progress this session. Based on pt PLOF, level of support, and current functional status, pt will benefit from skilled inpatient physical therapy <3 hours per day at dc for safe progression of functional mobility.     If plan is discharge home, recommend the following: A lot of help with walking and/or transfers;A lot of help with bathing/dressing/bathroom;Assistance with cooking/housework;Assist for transportation;Help with stairs or ramp for entrance   Can travel by private vehicle     No  Equipment Recommendations  None recommended by PT    Recommendations for Other Services       Precautions / Restrictions Precautions Precautions: Fall Precaution/Restrictions Comments: abd wound/incision dehiscence, ileostomy Restrictions Weight Bearing Restrictions Per Provider Order: No     Mobility  Bed Mobility Overal bed mobility: Needs Assistance Bed Mobility: Supine to Sit     Supine to sit: HOB elevated, Contact guard           Transfers Overall transfer level: Needs assistance Equipment used: Rolling walker (2 wheels) Transfers: Bed to chair/wheelchair/BSC, Sit to/from Stand Sit to Stand: Min assist   Step pivot transfers: Contact guard assist       General transfer comment: pt CGA to reach chair, small steps, sequencing well with task. Unable to progress mobility due to pain    Ambulation/Gait                   Stairs             Wheelchair Mobility     Tilt Bed    Modified Rankin (Stroke Patients Only)       Balance Overall balance assessment: Needs assistance Sitting-balance support: No upper extremity supported, Feet supported Sitting balance-Leahy Scale: Fair     Standing balance support: Reliant on assistive device for balance, Bilateral upper extremity supported Standing balance-Leahy Scale: Fair                              Hotel manager: Impaired Factors Affecting Communication: Reduced clarity of speech  Cognition Arousal: Alert Behavior During Therapy: Flat affect   PT - Cognitive impairments: No apparent impairments                         Following commands: Intact      Cueing Cueing Techniques: Verbal cues, Tactile cues, Gestural cues  Exercises      General Comments        Pertinent Vitals/Pain Pain Assessment Pain Assessment: 0-10 Pain Score: 10-Worst pain ever Pain Location: abd with movement, nursing admin meds in session Pain Descriptors / Indicators: Grimacing,  Guarding Pain Intervention(s): Limited activity within patient's tolerance, Monitored during session, Repositioned, RN gave pain meds during session    Home Living                          Prior Function            PT Goals (current goals can now be found in the care plan section) Acute Rehab PT Goals PT Goal Formulation: With patient Time For Goal Achievement: 05/03/24 Potential to Achieve Goals:  Fair Progress towards PT goals: Progressing toward goals    Frequency    Min 3X/week      PT Plan      Co-evaluation              AM-PAC PT 6 Clicks Mobility   Outcome Measure  Help needed turning from your back to your side while in a flat bed without using bedrails?: A Little Help needed moving from lying on your back to sitting on the side of a flat bed without using bedrails?: A Little Help needed moving to and from a bed to a chair (including a wheelchair)?: A Little Help needed standing up from a chair using your arms (e.g., wheelchair or bedside chair)?: A Little Help needed to walk in hospital room?: A Lot Help needed climbing 3-5 steps with a railing? : Total 6 Click Score: 15    End of Session   Activity Tolerance: Patient limited by fatigue;Patient limited by pain Patient left: in chair;with chair alarm set;with call bell/phone within reach;with nursing/sitter in room Nurse Communication: Mobility status PT Visit Diagnosis: Other abnormalities of gait and mobility (R26.89)     Time: 8869-8791 PT Time Calculation (min) (ACUTE ONLY): 38 min  Charges:    $Therapeutic Activity: 38-52 mins PT General Charges $$ ACUTE PT VISIT: 1 Visit                     [facility] PT Acute Rehabilitation Services Office: [phone] 04/21/2024    Stann Alexander [name] 04/21/2024, 12:15 PM

## 2024-04-22 LAB — CBC
HCT: 24.1 % — ABNORMAL LOW (ref 39.0–52.0)
Hemoglobin: 7.3 g/dL — ABNORMAL LOW (ref 13.0–17.0)
MCH: 29.7 pg (ref 26.0–34.0)
MCHC: 30.3 g/dL (ref 30.0–36.0)
MCV: 98 fL (ref 80.0–100.0)
Platelets: 525 K/uL — ABNORMAL HIGH (ref 150–400)
RBC: 2.46 MIL/uL — ABNORMAL LOW (ref 4.22–5.81)
RDW: 16.5 % — ABNORMAL HIGH (ref 11.5–15.5)
WBC: 11 K/uL — ABNORMAL HIGH (ref 4.0–10.5)
nRBC: 0.2 % (ref 0.0–0.2)

## 2024-04-22 LAB — CULTURE, RESPIRATORY W GRAM STAIN: Culture: NORMAL

## 2024-04-22 LAB — BASIC METABOLIC PANEL WITH GFR
Anion gap: 10 (ref 5–15)
BUN: 8 mg/dL (ref 8–23)
CO2: 22 mmol/L (ref 22–32)
Calcium: 8.4 mg/dL — ABNORMAL LOW (ref 8.9–10.3)
Chloride: 107 mmol/L (ref 98–111)
Creatinine, Ser: 0.56 mg/dL — ABNORMAL LOW (ref 0.61–1.24)
GFR, Estimated: >60 mL/min (ref >60)
Glucose, Bld: 93 mg/dL (ref 70–99)
Potassium: 3.6 mmol/L (ref 3.5–5.1)
Sodium: 139 mmol/L (ref 135–145)

## 2024-04-22 LAB — PROTIME-INR
INR: 2.8 — ABNORMAL HIGH (ref 0.8–1.2)
Prothrombin Time: 30.8 s — ABNORMAL HIGH (ref 11.4–15.2)

## 2024-04-22 LAB — MAGNESIUM: Magnesium: 2.1 mg/dL (ref 1.7–2.4)

## 2024-04-22 MED ORDER — LOPERAMIDE HCL 2 MG PO CAPS
2.0000 mg | ORAL_CAPSULE | Freq: Every day | ORAL | Status: DC
  Administered 2024-04-23 – 2024-04-30 (×8): 2 mg via ORAL
  Filled 2024-04-22 (×8): qty 1

## 2024-04-22 MED ORDER — WARFARIN SODIUM 2.5 MG PO TABS: 5.0000 mg | ORAL_TABLET | Freq: Once | ORAL | Status: DC

## 2024-04-22 MED ORDER — BOOST / RESOURCE BREEZE PO LIQD CUSTOM
1.0000 | Freq: Three times a day (TID) | ORAL | Status: DC
  Administered 2024-04-22 – 2024-04-28 (×12): 1 via ORAL

## 2024-04-22 NOTE — Consult Note (Signed)
.  WOC Nurse wound follow up Wound type: surgical Measurement: see Monday WOC notes Wound bed: 70% necrotic/slough, 25% pink, non healing, greater exposure of fascial sutures since Monday  Drainage (amount, consistency, odor) strong foul odor in the hallway and room  Periwound:intact  Dressing procedure/placement/frequency: Removed 1 pc of Mepitel, 1 pc of white foam, and 1pc of black foam from the wound bed. CCS PA Vertell at the bedside, increasing wound bed necrotic material, further separation of the fascial sutures, change in consistency of drainage and appearance of the drainage.  Vertell provided new orders to use Mepitel over the sutures in the base and apply saline moist to dry dressing BID. 1pc of Mepitel placed over suture line, fluff filled remainder of the wound bed with saline damp kerlix and topped with ABD pad and secured with tape. Made bedside nursing aware of the change and updated the wound care orders. Discussed with TOC as well that NPWT dressing had been DCed.     WOC Nurse ostomy follow up Stoma type/location: RLQ, loop ileostomy  Stomal assessment/size: 1 1/2 budded, pink, moist os at 4 o'clock  Red rubber catheter removed with pouch change today per Dr. Luigi order Peristomal assessment: intact  Treatment options for stomal/peristomal skin: 2 barrier ring Output liquid, high output, pouch was just before exploding, 450cc liquid green stool emptied and notified bedside nursing to add to output on nursing FS Ostomy pouching: 1 pc. Soft convex with 2 barrier ring Education provided:  Discussed in detail the need to notified staff when the pouch is full and to assist with pouch changes and emptying to keep his skill set and remain independent.  Reminded patient that in rehab facility he will need to empty his pouch and be aware of pouch filling to avoid leaks and blowouts . Staff will not be able to be as vigilant to empty.   Enrolled patient in [facility] [employer]  Discharge program: Yes, previously  Patient will be able to use same pouching system as he was using previously   FOR SNF: [facility] item numbers  1pc soft convex   [phone] 2 barrier ring [address] CNS, CWON-AP [phone]

## 2024-04-22 NOTE — Progress Notes (Signed)
 PHARMACY - ANTICOAGULATION CONSULT NOTE  Pharmacy Consult for Warfarin Indication: history of VTE  No Known Allergies  Patient Measurements: Height: 5' 9 (175.3 cm) Weight: 70.6 kg (155 lb 10.3 oz) IBW/kg (Calculated) : 70.7 HEPARIN  DW (KG): 72.8  Vital Signs: Temp: 97.9 F (36.6 C) (07/23 0545) Temp Source: Oral (07/23 0545) BP: 117/70 (07/23 0545) Pulse Rate: 75 (07/23 0545)  Labs: Recent Labs    04/20/24 0439 04/21/24 0453  HGB 7.4* 7.5*  HCT 23.6* 24.3*  PLT 304 429*  LABPROT 32.4* 28.5*  INR 3.0* 2.5*  CREATININE 0.71  --     Estimated Creatinine Clearance: 95.6 mL/min (by C-G formula based on SCr of 0.71 mg/dL).  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 PMH significant for emergent right colectomy with end ileostomy for perforated diverticulitis in April 2025 with post-op bleeding complications. Pt underwent ex lap with ileostomy takedown, sigmoid colectomy with diverting loop ileostomy on 04/13/24.   Pt was bridged peri-operatively with UFH while warfarin was on hold. Warfarin was resumed on 7/18 - pharmacy consulted to dose.   Significant Events/Anticoag History this Admission: 7/13: s/p colonoscopy 7/14: s/p ileostomy takedown, sigmoid colectomy with diverting loop ileostomy.    -Bridged with UFH perioperatively. On 7/17, UFH transitioned to LMWH bridge due to difficulty obtaining blood for lab monitoring.   Today, 04/22/24 INR =  2.8 - therapeutic but higher end of threshold despite holding dose on 7/21. Will consider discharging on lower PTA dosage given the rapid increase in INR since admission while on home regimen. Had high stool output of 1.3-1.9L over past few days (improving today) which would reduce Vit K absorption, reducing warfarin requirements. CBC: Hgb (7.3) low and decreased;  Pltc (525) increased.  D/w RN: no signs of bleeding at this time Major DDI: None Diet: Soft, 100% meal intake charted today; per patient, meal intake unchanged from prior to admission (minimal greens intake)  Goal of Therapy:  INR 2-3   Plan:  Warfarin 5mg  x1  Discharge recommendation: Warfarin 5mg  daily CBC, daily INR with AM labs  Thank you for allowing pharmacy to be a part of this patient's care.  Marget Hench, PharmD PGY1 Pharmacy Resident

## 2024-04-22 NOTE — TOC Progression Note (Signed)
 Transition of Care [facility]) - Progression Note    Patient Details  Name: Alexander [name] MRN: [id number] Date of Birth: 03-Feb-1961  [facility]) CM/SW Contact  Alfonse JONELLE Rex, RN Phone Number: 04/22/2024, 3:20 PM  Clinical Narrative:  request sent to [facility] , rep Kristeen, to review for admit criteria Per Ciera, patient meets critieria for [facility] for nonhealing wound, team notified. Per attending, Dr Tanda, possible return to OR for I&D and placement of biologic mesh or vicryl mesh to protect the bowel , team of multi-specialist follow. Kristeen states she will continue to follow. TOC will continue to follow.     Expected Discharge Plan: Home/Self Care Barriers to Discharge: Continued Medical Work up               Expected Discharge Plan and Services   Discharge Planning Services: CM Consult   Living arrangements for the past 2 months: Single Family Home                                       Social Drivers of Health (SDOH) Interventions SDOH Screenings   Food Insecurity: No Food Insecurity (04/10/2024)  Housing: High Risk (04/10/2024)  Transportation Needs: No Transportation Needs (04/10/2024)  Recent Concern: Transportation Needs - Unmet Transportation Needs (03/30/2024)  Utilities: Not At Risk (04/10/2024)  Depression (PHQ2-9): Low Risk  (07/24/2023)  Tobacco Use: Medium Risk (04/13/2024)    Readmission Risk Interventions    04/10/2024    1:55 PM 01/24/2024   10:43 AM 01/13/2024    9:13 AM  Readmission Risk Prevention Plan  Transportation Screening Complete Complete Complete  PCP or Specialist Appt within 3-5 Days  Complete Complete  HRI or Home Care Consult  Complete Complete  Social Work Consult for Recovery Care Planning/Counseling  Complete Complete  Palliative Care Screening  Not Applicable Not Applicable  Medication Review Oceanographer) Complete Complete Complete  PCP or Specialist appointment within 3-5 days of  discharge Complete    HRI or Home Care Consult Complete    SW Recovery Care/Counseling Consult Complete    Palliative Care Screening Not Applicable    Skilled Nursing Facility Not Applicable

## 2024-04-22 NOTE — Progress Notes (Signed)
 9 Days Post-Op   Subjective/Chief Complaint: Pain ok Has tolerated soft without n/v Having liquid ostomy output  No fevers x 72hrs  Wound vac applied to midline Monday Ostomy output down since starting bid imodium   Objective: Vital signs in last 24 hours: Temp:  [97.9 F (36.6 C)-99 F (37.2 C)] 99 F (37.2 C) (07/23 1339) Pulse Rate:  [75-92] 92 (07/23 1339) Resp:  [16-20] 17 (07/23 1339) BP: (117-138)/(70-80) 138/80 (07/23 1339) SpO2:  [99 %-100 %] 99 % (07/23 1339) Weight:  [76 kg] 76 kg (07/23 0712) Last BM Date : 04/22/24  Intake/Output from previous day: 07/22 0701 - 07/23 0700 In: 1736.9 [P.O.:1040; I.V.:596.9; IV Piggyback:100] Out: 1025 [Urine:500; Stool:525] Intake/Output this shift: Total I/O In: 600 [P.O.:600] Out: 1050 [Urine:450; Stool:600]  Awake, alert, nontoxic Symm chest rise  doing great with IS Mild tachy Some distension, liquid stool in bag; wound vac in place    No edema; no calf TTP   Lab Results:  Recent Labs    04/21/24 0453 04/22/24 0751  WBC 9.9 11.0*  HGB 7.5* 7.3*  HCT 24.3* 24.1*  PLT 429* 525*   BMET Recent Labs    04/20/24 0439 04/21/24 0453 04/22/24 0751  NA 138  --  139  K 3.1* 3.5 3.6  CL 108  --  107  CO2 21*  --  22  GLUCOSE 156*  --  93  BUN 11  --  8  CREATININE 0.71  --  0.56*  CALCIUM  8.1*  --  8.4*   PT/INR Recent Labs    04/21/24 0453 04/22/24 0751  LABPROT 28.5* 30.8*  INR 2.5* 2.8*   ABG No results for input(s): PHART, HCO3 in the last 72 hours.  Invalid input(s): PCO2, PO2   Studies/Results: No results found.    Anti-infectives: Anti-infectives (From admission, onward)    Start     Dose/Rate Route Frequency Ordered Stop   04/13/24 0900  cefoTEtan  (CEFOTAN ) 2 g in sodium chloride  0.9 % 100 mL IVPB        2 g 200 mL/hr over 30 Minutes Intravenous On call to O.R. 04/10/24 0838 04/13/24 1109       Assessment/Plan: history of right colectomy with end ileostomy for  perforated bleeding diverticulitis April 2025  abdominal dehiscence with exposed bowel   s/p Procedure(s): ILEOSTOMY TAKEDOWN (N/A) LYSIS, ADHESIONS 2 hours (N/A) SIGMOIDCOLECTOMY WITH DIVERTING LOOP ILEOSTOMY (N/A) LAPAROTOMY, EXPLORATORY (N/A) SPLENIC FLEXURE MOBILIZATION 04/13/24 EW  Wet - wound vac removed after I saw pt and discussed midline with WOC and PA.  The fascia appears a little cleaner compared to the other day but there does appear to be some more fascial separation based on the pic.  Discussing condition of fascia with multiple partners to gauge their opinion - weighing pros/cons of continued observation of midline wound vs return to OR and the risks of re-entering his abd.   GI -see below, ostomy teaching;   There is no signs of enteric drainage. Wound vac placed 7/21 on midline -  wondering about a piece of mepital with white foam on that section and then black sponge) . Can remove ostomy bridge; will start some low dose imodium  7/22 since ostomy output put out 1900 yesterday FEN -  KVO ivf 7/22 and see if pt keeps up with fluid intake, hypokalemia -resolved.  soft diet/protein shakes. Calorie count started completed Pulm - incentive spirometry, OOB Heme - hgb down some hgb 10.2-->8.4-->8.1-->7.8--8.7-->7.4-->7.5-->7.3, repeat cbc in am  H/o VTE - hep gtt stopped, switched to therapeutic Lovenox ; pharmacist stopped therapeutic Lovenox  today,  INR 2.8 on  Coumadin , mgmt per pharm; will hold coumadin  7/23 in case decision is made to return to OR in next 24-48 hrs.  Foley - none Pain -scheduled oral tylenol , , as needed oxycodone  and IV Dilaudid ;  scheduled low-dose gabapentin ; will decrease prn frequency of dilaudid ; increased dose of gabapentin  7/22 Pt/ot eval  ID/Fever-patient started spiking temperatures day on Friday.  Ua negative. CXR showed infiltrate - Right; now afebrile x 72hrs without tachy; the small bump in WBC on Sunday I think was due to hemoconcentration given  change in hgb/hct. Will hold off on abx for now but will get sputum culture- cx - normal flora; small bump in wbc 7/23 - repeat in am  Dispo-hold dispo for now - need to monitor wound; see above  [name], MD, FACS General, Bariatric, & Minimally Invasive Surgery [facility],  A [facility]   LOS: 13 days    Camellia Tanda 04/22/2024

## 2024-04-22 NOTE — Progress Notes (Signed)
 Nutrition Follow-up  DOCUMENTATION CODES:   Non-severe (moderate) malnutrition in context of chronic illness  INTERVENTION:   -Boost Breeze po TID, each supplement provides 250 kcal and 9 grams of protein   -Multivitamin with minerals daily  NUTRITION DIAGNOSIS:   Moderate Malnutrition related to chronic illness as evidenced by moderate fat depletion, moderate muscle depletion, percent weight loss.  GOAL:   Patient will meet greater than or equal to 90% of their needs  Progressing.  MONITOR:   PO intake, Supplement acceptance   ASSESSMENT:   63 y.o. male with a history of pulmonary embolism, aortic atherosclerosis, peripheral artery disease, diverticulosis, diverticulitis, gout, GERD, hypertension, pulmonary hypertension, right colectomy with end ileostomy.  Patient presented from his general surgeons office secondary to abdominal wall wound dehiscence requiring surgical management.  Patient underwent exploratory laparotomy on 7/14 with ileostomy takedown, lysis of adhesions, sigmoid colectomy, diverting loop ileostomy.  7/10: admitted 7/12: CLD -> FLD 7/13: NPO -> FLD, s/p colonoscopy 7/14: NPO, s/p ileostomy takedown, LOA, sigmoid colectomy, diverting loop ileostomy 7/16: CLD 7/17: FLD 7/19: Soft diet  Calorie Count completed, see separate note.   Patient reports eating a good breakfast. Consumed ~850 kcals and 27g protein.  Pt reports he prefers [employer] and requests one this morning. Provided one to patient at bedside. States his appetite has gotten progressively better since being here.   Admission weight: 156 lbs Current weight: 167 lbs  Medications: Multivitamin with minerals daily  Labs reviewed.  NUTRITION - FOCUSED PHYSICAL EXAM:  Flowsheet Row Most Recent Value  Orbital Region Mild depletion  Upper Arm Region Severe depletion  Thoracic and Lumbar Region Unable to assess  Buccal Region Mild depletion  Temple Region Moderate depletion  Clavicle  Bone Region Moderate depletion  Clavicle and Acromion Bone Region Moderate depletion  Scapular Bone Region Moderate depletion  Dorsal Hand Mild depletion  Patellar Region Unable to assess  Anterior Thigh Region Unable to assess  Posterior Calf Region Unable to assess  Edema (RD Assessment) None  Hair Reviewed  Eyes Reviewed  Mouth Reviewed  Skin Reviewed  Nails Reviewed    Diet Order:   Diet Order             DIET SOFT Fluid consistency: Thin  Diet effective now                   EDUCATION NEEDS:   Not appropriate for education at this time  Skin:  Skin Assessment: Skin Integrity Issues: Skin Integrity Issues:: Other (Comment) Other: surgical wound -abdomen  Last BM:  7/23 -ileostomy  Height:   Ht Readings from Last 1 Encounters:  04/13/24 5' 9 (1.753 m)    Weight:   Wt Readings from Last 1 Encounters:  04/22/24 76 kg    BMI:  Body mass index is 24.74 kg/m.  Estimated Nutritional Needs:   Kcal:  2150-2350  Protein:  110-130g  Fluid:  2L/day   Morna Lee, MS, RD, LDN Inpatient Clinical Dietitian Contact via Secure chat

## 2024-04-22 NOTE — Progress Notes (Signed)
 OT Cancellation Note  Patient Details Name: Alexander [name] MRN: [id number] DOB: [birth year]   Cancelled Treatment:    Reason Eval/Treat Not Completed: Patient declined, no reason specified  Jacques Karna Loose 04/22/2024, 11:15 AM

## 2024-04-22 NOTE — Plan of Care (Signed)
   Problem: Education: Goal: Knowledge of General Education information will improve Description Including pain rating scale, medication(s)/side effects and non-pharmacologic comfort measures Outcome: Progressing   Problem: Health Behavior/Discharge Planning: Goal: Ability to manage health-related needs will improve Outcome: Progressing

## 2024-04-23 DIAGNOSIS — E440 Moderate protein-calorie malnutrition: Secondary | ICD-10-CM | POA: Diagnosis present

## 2024-04-23 LAB — CBC
HCT: 23.7 % — ABNORMAL LOW (ref 39.0–52.0)
Hemoglobin: 7.3 g/dL — ABNORMAL LOW (ref 13.0–17.0)
MCH: 29.7 pg (ref 26.0–34.0)
MCHC: 30.8 g/dL (ref 30.0–36.0)
MCV: 96.3 fL (ref 80.0–100.0)
Platelets: 599 K/uL — ABNORMAL HIGH (ref 150–400)
RBC: 2.46 MIL/uL — ABNORMAL LOW (ref 4.22–5.81)
RDW: 16.4 % — ABNORMAL HIGH (ref 11.5–15.5)
WBC: 11.8 K/uL — ABNORMAL HIGH (ref 4.0–10.5)
nRBC: 0.2 % (ref 0.0–0.2)

## 2024-04-23 LAB — PROTIME-INR
INR: 2.5 — ABNORMAL HIGH (ref 0.8–1.2)
Prothrombin Time: 28.6 s — ABNORMAL HIGH (ref 11.4–15.2)

## 2024-04-23 MED ORDER — WARFARIN SODIUM 2.5 MG PO TABS
7.5000 mg | ORAL_TABLET | Freq: Every day | ORAL | Status: DC
  Administered 2024-04-23: 7.5 mg via ORAL
  Filled 2024-04-23: qty 3

## 2024-04-23 NOTE — Progress Notes (Signed)
 PHARMACY - ANTICOAGULATION CONSULT NOTE  Pharmacy Consult for Warfarin Indication: history of VTE  No Known Allergies  Patient Measurements: Height: 5' 9 (175.3 cm) Weight: 76 kg (167 lb 8.8 oz) IBW/kg (Calculated) : 70.7 HEPARIN  DW (KG): 72.8  Vital Signs: Temp: 98.1 F (36.7 C) (07/24 0536) Temp Source: Oral (07/24 0536) BP: 122/78 (07/24 0536) Pulse Rate: 75 (07/24 0536)  Labs: Recent Labs    04/21/24 0453 04/22/24 0751 04/23/24 0508  HGB 7.5* 7.3* 7.3*  HCT 24.3* 24.1* 23.7*  PLT 429* 525* 599*  LABPROT 28.5* 30.8* 28.6*  INR 2.5* 2.8* 2.5*  CREATININE  --  0.56*  --     Estimated Creatinine Clearance: 95.7 mL/min (A) (by C-G formula based on SCr of 0.56 mg/dL (L)).  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 PMH significant for emergent right colectomy with end ileostomy for perforated diverticulitis in April 2025 with post-op bleeding complications. Pt underwent ex lap with ileostomy takedown, sigmoid colectomy with diverting loop ileostomy on 04/13/24.   Pt was bridged peri-operatively with UFH while warfarin was on hold. Warfarin was resumed on 7/18 - pharmacy consulted to dose.   Significant Events/Anticoag History this Admission: 7/13: s/p colonoscopy 7/14: s/p ileostomy takedown, sigmoid colectomy with diverting loop ileostomy.    -Bridged with UFH perioperatively. On 7/17, UFH transitioned to LMWH bridge due to difficulty obtaining blood for lab monitoring.   Today, 04/23/24 INR =  2.5 - therapeutic - dose was held yesterday by CCS, now OK to continue CBC: Hgb 7.3 - low/stable, PLT 599 -  high  no bleeding reported Major DDI: None Goal of Therapy:  INR 2-3   Plan:  Resume PTA dose of warfarin 7.5 mg daily Discharge recommendation: Warfarin  7.5 mg daily   Rosaline IVAR Edison,  Pharm.D Use secure chat for questions 04/23/2024 10:53 AM

## 2024-04-23 NOTE — Progress Notes (Signed)
 10 Days Post-Op   Subjective/Chief Complaint: Pain ok Has tolerated soft without n/v Having liquid ostomy output  No fevers x 96hrs Wbc slowly going up but no fever/tachy.    Objective: Vital signs in last 24 hours: Temp:  [98.1 F (36.7 C)-99.3 F (37.4 C)] 98.1 F (36.7 C) (07/24 0536) Pulse Rate:  [75-92] 75 (07/24 0536) Resp:  [17-20] 20 (07/24 0536) BP: (122-138)/(78-83) 122/78 (07/24 0536) SpO2:  [99 %-100 %] 100 % (07/24 0536) Last BM Date : 04/23/24  Intake/Output from previous day: 07/23 0701 - 07/24 0700 In: 1800 [P.O.:1800] Out: 1875 [Urine:850; Stool:1025] Intake/Output this shift: Total I/O In: 120 [P.O.:120] Out: 600 [Stool:600]  Awake, alert, nontoxic Symm chest rise  doing great with IS Mild tachy Some distension, liquid stool in bag;   Patient seen and examined with the wound care nurse.  He has more fascial separation today.  There is some exposed small bowel but it is intact.  There is some fibrinous exudate along the fascial edges.  No edema; no calf TTP   Lab Results:  Recent Labs    04/22/24 0751 04/23/24 0508  WBC 11.0* 11.8*  HGB 7.3* 7.3*  HCT 24.1* 23.7*  PLT 525* 599*   BMET Recent Labs    04/21/24 0453 04/22/24 0751  NA  --  139  K 3.5 3.6  CL  --  107  CO2  --  22  GLUCOSE  --  93  BUN  --  8  CREATININE  --  0.56*  CALCIUM   --  8.4*   PT/INR Recent Labs    04/22/24 0751 04/23/24 0508  LABPROT 30.8* 28.6*  INR 2.8* 2.5*   ABG No results for input(s): PHART, HCO3 in the last 72 hours.  Invalid input(s): PCO2, PO2   Studies/Results: No results found.    Anti-infectives: Anti-infectives (From admission, onward)    Start     Dose/Rate Route Frequency Ordered Stop   04/13/24 0900  cefoTEtan  (CEFOTAN ) 2 g in sodium chloride  0.9 % 100 mL IVPB        2 g 200 mL/hr over 30 Minutes Intravenous On call to O.R. 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  There is some exposed small bowel but it is intact.  I have reviewed the case with numerous partners of mine.  The majority opinion is not to return to the OR because of the high risk of enterotomy and potentially frozen abdomen at this point.  I discussed the fascial dehiscence with the patient.  We discussed the potential complications of the wound in his current condition which could be further fascial separation with potential evisceration and fistula formation versus return to the OR with its inherent potential risk such as enterotomy, the need for Vicryl or biologic mesh placement and retention sutures perhaps and that he would still form an incisional hernia.  Discussed the pros and cons extensively with the patient.  Patient has declined any additional surgeries during this admission.  He wants to continue current wound care and see what happens.  He understands that there is a chance he may develop a serious complication  from the wound and its current condition.  He understands that if he develops a complication from his midline such as fistula or evisceration then that potentially changes the timeframe for loop ileostomy reversal  GI -see below, ostomy teaching;   There is no signs of enteric drainage; imodium  2mg  q day;  FEN -  KVO ivf 7/22 and see if pt keeps up with fluid intake, hypokalemia -resolved.  soft diet/protein shakes. Calorie count started completed; may need to titrate Imodium  as needed for high ostomy output Pulm - incentive spirometry, OOB Heme - hgb down some hgb  10.2-->8.4-->8.1-->7.8--8.7-->7.4-->7.5-->7.3, repeat cbc in am      H/o VTE - hep gtt stopped, switched to therapeutic Lovenox ; pharmacist stopped therapeutic Lovenox  today,  INR 2.5 on  Coumadin , mgmt per pharm; Coumadin  per pharmacy Foley - none Pain -scheduled oral tylenol , , as needed oxycodone  and IV Dilaudid ;  scheduled low-dose gabapentin ; will decrease prn frequency of dilaudid ; increased dose of gabapentin  7/22 Pt/ot eval  ID/Fever-patient started spiking temperatures day on Friday.  Ua negative. CXR showed infiltrate - Right; now afebrile x 72hrs without tachy; the small bump in WBC on Sunday I think was due to hemoconcentration given change in hgb/hct. Will hold off on abx for now but will get sputum culture- cx - normal flora; small bump in wbc 7/23 & 24 - repeat in am; I think the white blood cell count is coming from the fascia.  He is not tachycardic.  He is not ill-appearing.  Dispo-explained the patient that given the complexity of his midline wound he is no longer a candidate for skilled nursing facility in my opinion.  He has agreed to go to LTAC for ongoing medical management of his midline wound and ostomy care and nursing support.  [name], MD, FACS General, Bariatric, & Minimally Invasive Surgery [facility],  [facility]   LOS: 14 days    Camellia Tanda 04/23/2024

## 2024-04-23 NOTE — Discharge Summary (Addendum)
 Physician Discharge Summary  Alexander [name] FMW:[id number] DOB: 1961-08-07 DOA: 04/09/2024  PCP: [name], PA  Admit date: 04/09/2024 Discharge date: 04/30/2024  Recommendations for Outpatient Follow-up:    Tanda Locus, MD Consulting Physician General Surgery [phone] [facility] [address] [place] [address]     Next Steps: Schedule an appointment as soon as possible for a visit in 3 week(s) Instructions: For wound re-check     Discharge Diagnoses:  Abdominal wall dehiscence Status post ex lap, lysis of adhesions x2 hrs, ileostomy takedown (ileostomy to distal transverse colon anastomosis), sigmoid colectomy with diverting loop ileostomy, mobilization of splenic flexure April 13, 2024 -Dr. 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 Locus Tanda  Colonoscopy April 12, 2024 Dr. Wilhelmenia  Discharge Condition: fair  Disposition: LTAC  Diet recommendation: regular/BRAT diet  Filed Weights   04/26/24 0500 04/27/24 0720 04/29/24 0500  Weight: 69.9 kg 68.8 kg 55 kg   Consults: [facility] hospitalist (signed off several days after surgery), PT/OT, wound care, pharmacy, dietitian  History of present illness:  63 year old male with a past medical history of pulmonary embolism/DVT on chronic Coumadin , aortic atherosclerosis, PAD, gout, hypertension, pulmonary hypertension who underwent emergent right hemicolectomy, placement of ABThera due to perforated bleeding diverticulitis on  April 16th 2025.  He was taken back to the operating room emergently on April 17 for reexploration and bleeding.  He was taken back on April 17  for creation of end colostomy and abdominal closure.  he had a prolonged postoperative course.  He ended up having an end ileostomy.  He had a percutaneous drain placed in an intra-abdominal abscess by radiology.  Subsequent CTs and drain injections demonstrated communication to the small intestine.  He was seen in the emergency room in early July where a CT scan showed no residual abscess and the patient reported no continued drainage from the [facility] drain and it was removed in the ER.  His abdominal wound slowly dehisced over time.  He ended up having a section of small bowel visible through the lower midline.  I was concerned that this would develop into an EC fistula complicating his overall course and eventually setting him up for malnutrition, acute kidney injury.  I recommended admission to the hospital, reversing his anticoagulation and getting a colonoscopy to evaluate his distal colon with the plan to takedown his ileostomy and close his abdomen.  Hospital Course:  The patient was direct admitted on July 10.  He was admitted to the hospitalist service.  I followed him in consultation.  His heparin  was held.  He got 1 dose of vitamin K .  He had an admission CT that demonstrated no perforation or drainage of enteric contents.  He was bridged with heparin  perioperatively by pharmacy.  We consulted gastroenterology to attempt to do a colonoscopy prior to his exploration surgery on July 14.  He underwent a colonoscopy.  Colonoscopy was done up to the splenic flexure but at about 24 cm from the anal verge the light from the colonoscope was visible in the midline where there was exposed bowel.  There is no defect in the colon but it was involved in the area that was dehisced.  He was taken to the  operating room on July 14.  Please see operative note for additional details.  We were able to get into his abdomen without getting into the small bowel.  He had extensive intra-abdominal adhesions require extensive  lysis adhesions.  The sigmoid colon was densely adhered and plastered to the left lower quadrant abdominal wall and exposed in the midline and the sigmoid colon was entered during surgery which was not an unexpected occurrence.  We ended up taking down his end ileostomy.  We did a sigmoid colectomy and did a primary anastomosis for the sigmoid colon.  I connected his end ileum to his transverse colon but since he had 2 distal colonic anastomoses we decided to divert him proximally with a loop ileostomy.  We felt that that would be an easier surgery to deal with later on as opposed to reexploring the patient's pelvis if he was given an end colostomy.  His abdomen was closed and his skin was left open.  He was resumed on IV heparin .  He went to stepdown immediately after surgery.  We had initial issues with pain control.  He was initially managed with a PCA.  But as he regained bowel function he was converted to oral medications.  Wet-to-dry dressings were applied to the midline.  Without any signs of bleeding he was restarted on Coumadin  and managed by pharmacy.  His hemoglobin drifted down but he did not require transfusion-hemoglobin remained above 7..  PT and OT were consulted.  He worked with them intermittently.  Around postop day 5 he started having fevers 102 and some tachycardia.  There was no signs of enteric drainage from the midline.  Patient was not doing pulmonary toilet exercises that well.  Urinalysis was normal.  Chest x-ray revealed perhaps some consolidation in the right lung.  Sputum culture was obtained and ultimately grew back normal flora.  Patient's fever defervesced.  He has remained afebrile without tachycardia for the remainder of his hospitalization.  Unfortunately on subsequent examinations of his midline wound the fascia started to have some separation.  Initially we changed to Mepitel with white foam and black sponge.  The wound VAC was applied the other week.  The wound VAC was  subsequent removed when there was signs of fascial edges sloughing.  Patient at this point was therapeutic on his Coumadin .  Ostomy output was anywhere from a liter to 1800 mL/day.  I ended up starting him on 2 mg of Imodium  to slow down his ostomy output.  Patient had some progression of fascial dehiscence.  I discussed the case with my partners.  I discussed the condition of his abdominal wall separation/dehiscence with the patient.  Discussed pros and cons of observation with local wound control versus return to the OR.  This is documented in his medical record.  Patient declined any additional surgeries during this admission unless it was an emergency such as evisceration.  Patient has been tolerating a diet without nausea or vomiting.  No sign of fistula formation.  He has been drinking supplemental protein shakes as well.   He progressed to complete abdominal with assistance with exposed small bowel.  Again discussed the case with multiple partners.  Discussed pros and cons of returning to the OR with the patient.  He again declined return to the OR.  He was draining some green purulent fluid through his open dehiscence.  There was no sign of enteric drainage.  We did get a CT of his abdomen pelvis earlier this week that  showed several intra-abdominal fluid collections that appear to be draining through his open dehiscence.  In order to help promote evacuation of the intra-abdominal abscesses/fluid I replaced the wound VAC.  We placed a large piece of Mepitel at the base of the wound followed by white foam followed by black sponge.  The patient was started on Zosyn  on July 28 for a probable 5-day course.  Currently the patient is on fiber once a day and once a daily Imodium  for his ostomy output.  His Imodium  dose may need to be titrated based on his ileostomy output   His INR was a little supratherapeutic the past day or 2.  His Coumadin  dose was held on July 30.  Pharmacy has been titrating his  Coumadin  dose based on his INR.SABRA  Vital signs are stable on the day of discharge    BP (!) 96/58 (BP Location: Left Arm)   Pulse 74   Temp (!) 97.5 F (36.4 C) (Oral)   Resp 17   Ht 5' 9 (1.753 m)   Wt 55 kg   SpO2 100%   BMI 17.91 kg/m   Ostomy output past 24 hrs 700cc  Gen: alert, NAD, non-toxic appearing Pupils: equal, no scleral icterus Pulm: Lungs clear to auscultation, symmetric chest rise CV: regular rate and rhythm Abd: soft, diverting loop ileostomy in place; stool in bag; wound vac intact/secure, some dark fluid in cannister - not c/w enteric drainage Ext: no edema, no calf tenderness Skin: no rash, no jaundice   Discharge Instructions  Discharge Instructions     Diet general   Complete by: As directed    Plus supplemental protein shakes   Discharge instructions   Complete by: As directed    Monitor INR Monitor electrolytes as needed for ostomy output   Discharge wound care:   Complete by: As directed    See wound care in DC instructions section   Increase activity slowly   Complete by: As directed    No lifting/pushing/pulling anything greater than 10 lbs May participate in pt/ot      Allergies as of 04/30/2024   No Known Allergies      Medication List     TAKE these medications    acetaminophen  500 MG tablet Commonly known as: TYLENOL  Take 2 tablets (1,000 mg total) by mouth every 8 (eight) hours. What changed:  how much to take when to take this reasons to take this   ascorbic acid  500 MG tablet Commonly known as: VITAMIN C  Take 1 tablet (500 mg total) by mouth 2 (two) times daily.   gabapentin  100 MG capsule Commonly known as: NEURONTIN  Take 2 capsules (200 mg total) by mouth 3 (three) times daily. What changed:  how much to take when to take this   HYDROmorphone  1 MG/ML injection Commonly known as: DILAUDID  Inject 0.5 mLs (0.5 mg total) into the vein every 4 (four) hours as needed (breakthru pain).   loperamide  2 MG  capsule Commonly known as: IMODIUM  Take 1 capsule (2 mg total) by mouth daily.   methocarbamol  500 MG tablet Commonly known as: ROBAXIN  Take 1 tablet (500 mg total) by mouth 3 (three) times daily. What changed:  when to take this reasons to take this   multivitamin with minerals Tabs tablet Take 1 tablet by mouth daily.   ondansetron  4 MG tablet Commonly known as: ZOFRAN  Take 4 mg by mouth every 8 (eight) hours as needed for nausea or vomiting.   oxyCODONE  5 MG immediate  release tablet Commonly known as: Roxicodone  Take 1 tablet (5 mg total) by mouth every 6 (six) hours as needed for severe pain (pain score 7-10).   pantoprazole  40 MG tablet Commonly known as: PROTONIX  Take 1 tablet (40 mg total) by mouth daily. What changed: when to take this   piperacillin -tazobactam 3.375 GM/50ML IVPB Commonly known as: ZOSYN  Inject 50 mLs (3.375 g total) into the vein every 8 (eight) hours for 4 days.   psyllium 95 % Pack Commonly known as: HYDROCIL/METAMUCIL Take 1 packet by mouth daily.   sodium chloride  0.9 % infusion Inject 40 mLs into the vein continuous.   traZODone  100 MG tablet Commonly known as: DESYREL  Take 1 tablet (100 mg total) by mouth at bedtime.   warfarin 2.5 MG tablet Commonly known as: COUMADIN  Take as directed. If you are unsure how to take this medication, talk to your nurse or doctor. Original instructions: TAKE 1 TABLET (2.5mg )  BY MOUTH DAILY OR AS DIRECTED BY ANTICOAGULATION CLINIC What changed:  medication strength additional instructions   zinc  sulfate (50mg  elemental zinc ) 220 (50 Zn) MG capsule Take 1 capsule (220 mg total) by mouth daily.               Discharge Care Instructions  (From admission, onward)           Start     Ordered   04/30/24 0000  Discharge wound care:       Comments: See wound care in DC instructions section   04/30/24 0837            Contact information for after-discharge care     Destination      [facility]                      The results of significant diagnostics from this hospitalization (including imaging, microbiology, ancillary and laboratory) are listed below for reference.    Significant Diagnostic Studies: CT ABDOMEN PELVIS W CONTRAST Result Date: 04/27/2024 CLINICAL DATA:  Abdominal pain, partial colectomy and wound dehiscence with bowel extension through laparotomy dehiscence requiring resections, drainage through dehiscence site. EXAM: CT ABDOMEN AND PELVIS WITH CONTRAST TECHNIQUE: Multidetector CT imaging of the abdomen and pelvis was performed using the standard protocol following bolus administration of intravenous contrast. RADIATION DOSE REDUCTION: This exam was performed according to the departmental dose-optimization program which includes automated exposure control, adjustment of the mA and/or kV according to patient size and/or use of iterative reconstruction technique. CONTRAST:  OMNIPAQUE  IOHEXOL  300 MG/ML  SOLN COMPARISON:  04/09/2024 FINDINGS: Lower chest: Airspace opacity with volume loss in both lower lobes primarily due to atelectasis. Mild cardiomegaly. Right coronary artery atheromatous vascular calcification. Hepatobiliary: Unremarkable Pancreas: Unremarkable Spleen: Unremarkable Adrenals/Urinary Tract: Mild scarring of the left kidney upper pole medially, unchanged from prior. Otherwise unremarkable. Stomach/Bowel: Postoperative findings including a right loop ileostomy (image 50, series 2), right hemicolectomy with ileotransverse anastomosis (image 32 series 2), and prior partial sigmoid colectomy with sigmoid anastomosis on image 66 series 2. There is moderate residual descending and sigmoid colon diverticulosis. No dilated bowel. Large midline wound with separation of the rectus abdominus musculature and subcutaneous tissues, measuring about 15.8 cm vertically and with the gap between the rectus abdominus musculature about 8.5 cm on  image 45 series 2. There is a collection of fluid continuous with this wound extending deep to the right rectus abdominus muscle and presumably along the right anterior peritoneal margin on image 56 series 2  measuring about 8.0 by 2.0 by 7.8 cm (volume = 65 cm^3). This is likely spontaneously draining into the wound, although does extend along the anterior peritoneal margin. There is a vertical tubular collection extraluminal gas and fluid in the left paracolic gutter extending up from the vertical level of the spleen down into the pelvis and within anterior extension towards the midline wound along the peritoneal surface for example on image 54 series 2. This measures roughly about 70 cc in volume. Appearance compatible with an abscess although this could be spontaneously draining to the peritoneal wound. There a flat collection gas and fluid extending from the upper margin of the laparotomy wound anterior to the liver, measuring about 9.8 by 1.1 by 9.5 cm (volume = 54 cm^3) and shown on image 74 series 6. As with the other collections, the appearance is suspicious for an abscess and in this case resembles free intraperitoneal gas adjacent to the liver, but also may well be draining to the abdominal wound collection and accordingly may be a contained collection. Finally, in the right abdomen just below the liver and gallbladder fossa, a 5.0 by 4.1 by 3.2 cm collection of gas shown on image 37 series 6 could well be within bowel but could also represent a abscess cavity based on morphology. The patient refused oral contrast for this exam which makes this collection ambiguous. None of the collections are definitively shown to have a connection to bowel. No pneumatosis identified. No portal venous gas. Vascular/Lymphatic: Mild aortoiliac atheromatous vascular calcification. Atheromatous vascular calcification is more striking along the pelvic outflow region. Reproductive: Unremarkable Other: Trace presacral edema.  Musculoskeletal: Mild bridging spurring of the left sacroiliac joint anteriorly. Avascular necrosis of the left femoral head with some associated cortical discontinuity suggesting early collapse/fracture. This is stable from 04/09/2024. Lower lumbar spondylosis and degenerative disc disease causing bilateral foraminal impingement at L4-5 and L5-S1. IMPRESSION: 1. Large midline wound with separation of the rectus abdominus musculature and subcutaneous tissues, measuring about 15.8 cm vertically and with the gap between the rectus abdominus musculature about 8.5 cm. 2. There is a collection of fluid continuous with this wound extending deep to the right rectus abdominus muscle and presumably along the right anterior peritoneal margin measuring about 8.0 by 2.0 by 7.8 cm (volume = 65 cc). This is likely spontaneously draining into the midline abdominal wound, although does extend along the anterior peritoneal margin. 3. There is a vertical tubular collection of extraluminal gas and fluid in the left paracolic gutter extending up from the vertical level of the spleen down into the pelvis and with anterior extension towards the midline wound along the peritoneal surface. This measures roughly about 70 cc in volume. Appearance compatible with an abscess although this could be spontaneously draining to the midline abdominal wound. 4. There a flat collection of gas and fluid extending from the upper margin of the laparotomy wound anterior to the liver, measuring about 9.8 by 1.1 by 9.5 cm (volume = 54 cc). As with the other collections, the appearance is suspicious for an abscess and in this case resembles free intraperitoneal gas adjacent to the liver, but also may well be draining to the abdominal wound collection and accordingly may be a contained collection. 5. In the right abdomen just below the liver and gallbladder fossa, a 5.0 by 4.1 by 3.2 cm collection of gas shown could well be within bowel but could also  represent abscess cavity based on morphology. The patient refused oral contrast for this  exam which makes this collection ambiguous. 6. Airspace opacity with volume loss in both lower lobes primarily due to atelectasis. 7. Mild cardiomegaly. Right coronary artery atheromatous vascular calcification. 8. Avascular necrosis of the left femoral head with some associated cortical discontinuity suggesting early collapse/fracture. This is stable from 04/09/2024. 9. Lower lumbar spondylosis and degenerative disc disease causing bilateral foraminal impingement at L4-5 and L5-S1. 10. Mild scarring of the left kidney upper pole medially, unchanged from prior. 11.  Aortic Atherosclerosis (ICD10-I70.0). Electronically Signed   By: Ryan Salvage M.D.   On: 04/27/2024 15:07   DG CHEST PORT 1 VIEW Result Date: 04/26/2024 CLINICAL DATA:  Fever. EXAM: PORTABLE CHEST 1 VIEW COMPARISON:  Chest radiograph dated 04/19/2024. FINDINGS: Improved aeration of the lungs. Bilateral infrahilar densities may represent residual infiltrate or atelectasis. No pleural effusion pneumothorax. Mild cardiomegaly. No acute osseous pathology. IMPRESSION: Improved aeration of the lungs. Bilateral infrahilar densities may represent residual infiltrate or atelectasis. Electronically Signed   By: Vanetta Chou M.D.   On: 04/26/2024 14:16   DG Chest 2 View Result Date: 04/19/2024 CLINICAL DATA:  [id number] Fever [id number] EXAM: CHEST - 2 VIEW COMPARISON:  Chest x-ray 04/19/2024 10:07 a.m. FINDINGS: The heart and mediastinal contours are unchanged. Atherosclerotic plaque. Slightly improved aeration of the right lung with persistent patchy airspace opacity of the right mid lower lung zone. Left base atelectasis. No pulmonary edema. No pleural effusion. No pneumothorax. No acute osseous abnormality. IMPRESSION: 1. Slightly improved aeration of the right lung with persistent right lung multifocal pneumonia. Followup PA and lateral chest X-ray is recommended  in 3-4 weeks following therapy to ensure resolution and exclude underlying malignancy. 2.  Aortic Atherosclerosis (ICD10-I70.0). Electronically Signed   By: Morgane  Naveau M.D.   On: 04/19/2024 19:25   DG CHEST PORT 1 VIEW Result Date: 04/19/2024 CLINICAL DATA:  Fever EXAM: PORTABLE CHEST 1 VIEW COMPARISON:  X-ray 02/01/2024 FINDINGS: Underinflation. Enlarged cardiopericardial silhouette with calcified aorta. Prominent central vasculature. Mild opacity seen along both lung bases, right-greater-than-left. Left areas similar to previous. The right is new. Acute infiltrates possible. No pneumothorax or effusion. Old right-sided rib fractures. Kyphotic x-ray SI rotated to the right. Limiting evaluation the right lung apex. IMPRESSION: Limited x-ray. Under penetrated and kyphotic. Increasing right lung base opacity. Infiltrates possible. Recommend follow-up. Enlarged heart with prominence of central vasculature. Electronically Signed   By: Ranell Bring M.D.   On: 04/19/2024 10:55   DG Abd 1 View Result Date: 04/14/2024 CLINICAL DATA:  [id number] Encounter for imaging study to confirm nasogastric (NG) tube placement [id number] EXAM: ABDOMEN - 1 VIEW COMPARISON:  April 09, 2024, January 22, 2024 FINDINGS: Well-positioned esophagogastric tube terminating in the region of the gastric antrum. Nonobstructive bowel gas pattern. No pneumoperitoneum. No organomegaly or radiopaque calculi. No acute fracture or destructive lesion. Patchy opacities in both lung bases.Multilevel thoracolumbar osteophytosis. IMPRESSION: Well-positioned esophagogastric tube terminating in the region of the gastric antrum. Electronically Signed   By: Rogelia Myers M.D.   On: 04/14/2024 16:23   CT ABDOMEN PELVIS W CONTRAST Result Date: 04/09/2024 CLINICAL DATA:  Acute, non localized abdominal pain. History of right colectomy and end ileostomy. IR injection of a drain in the abdomen on 03/24/2024 demonstrated communication of the area of the drain with a  small bowel loop compatible with a small bowel fistula. EXAM: CT ABDOMEN AND PELVIS WITH CONTRAST TECHNIQUE: Multidetector CT imaging of the abdomen and pelvis was performed using the standard protocol following bolus administration of intravenous contrast. RADIATION DOSE  REDUCTION: This exam was performed according to the departmental dose-optimization program which includes automated exposure control, adjustment of the mA and/or kV according to patient size and/or use of iterative reconstruction technique. CONTRAST:  OMNIPAQUE  IOHEXOL  300 MG/ML  SOLN COMPARISON:  04/03/2024 and IR drain injection dated 03/24/2024. FINDINGS: Lower chest: Heart size near the upper limit of normal. Small amount of residual atelectasis in the left lower lobe. Hepatobiliary: No focal liver abnormality is seen. No gallstones, gallbladder wall thickening, or biliary dilatation. Pancreas: Unremarkable. No pancreatic ductal dilatation or surrounding inflammatory changes. Spleen: Normal in size without focal abnormality. Adrenals/Urinary Tract: Normal-appearing adrenal glands. Mild bilateral renal cortical scarring. Unremarkable ureters. Poorly distended urinary bladder with moderate diffuse wall thickening. Stomach/Bowel: Stomach distended with ingested material. Normal appearing small bowel with an ileostomy in place with normal passage of contrast through the small bowel and into the ostomy bag. Status post right and proximal transverse colon colectomy with an oversewn staple line at the proximal end of the distal transverse colon. Multiple descending and sigmoid colon diverticula without evidence of diverticulitis. Vascular/Lymphatic: Atheromatous arterial calcifications without aneurysm. No enlarged lymph nodes. Reproductive: Prostate is unremarkable. Other: Open midline abdominal wound. Small bilateral inguinal hernias. No intra-abdominal fluid collections or extravasated contrast. Musculoskeletal: Stable changes of old  avascular necrosis with cystic changes in the left femoral head. Lumbar and lower thoracic spine degenerative changes. IMPRESSION: 1. No acute abnormality. 2. Status post right and proximal transverse colon colectomy with an ileostomy in place. 3. No evidence of a persistent small-bowel fistula or leak on these images. 4. Descending and sigmoid colon diverticulosis. 5. Small bilateral inguinal hernias. 6. 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 Electronically Signed   By: Elspeth Bathe M.D.   On: 04/09/2024 19:56   CT ABDOMEN PELVIS W CONTRAST Result Date: 04/03/2024 CLINICAL DATA:  Abdominal pain. Recent open laparotomy and colostomy with persistent open ventral wound. EXAM: CT ABDOMEN AND PELVIS WITH CONTRAST TECHNIQUE: Multidetector CT imaging of the abdomen and pelvis was performed using the standard protocol following bolus administration of intravenous contrast. RADIATION DOSE REDUCTION: This exam was performed according to the departmental dose-optimization program which includes automated exposure control, adjustment of the mA and/or kV according to patient size and/or use of iterative reconstruction technique. CONTRAST:  OMNIPAQUE  IOHEXOL  300 MG/ML  SOLN COMPARISON:  03/23/2024 FINDINGS: Lower chest: Dependent atelectasis noted in both lower lungs. Nodular consolidative opacity in the left lower posterior costophrenic sulcus is probably atelectatic although infection is not excluded. Hepatobiliary: No suspicious focal abnormality within the liver parenchyma. Gallbladder is distended without calcified gallstones. No pericholecystic fluid. No intrahepatic or extrahepatic biliary dilation. Pancreas: No focal mass lesion. No dilatation of the main duct. No intraparenchymal cyst. No peripancreatic edema. Spleen: No splenomegaly. No suspicious focal mass lesion. Trace perisplenic fluid seen previously has resolved in the interval. Adrenals/Urinary Tract: No  adrenal nodule or mass. Similar appearance of heterogeneous bilateral renal perfusion. No evidence for hydroureter. The urinary bladder appears normal for the degree of distention. Stomach/Bowel: Stomach is unremarkable. No gastric wall thickening. No evidence of outlet obstruction. Duodenum is normally positioned as is the ligament of Treitz. No small bowel wall thickening. No small bowel dilatation. Status post right hemicolectomy with right abdominal end ileostomy. Vascular/Lymphatic: There is moderate atherosclerotic calcification of the abdominal aorta without aneurysm. There is no gastrohepatic or hepatoduodenal ligament lymphadenopathy. No retroperitoneal or mesenteric lymphadenopathy. No pelvic sidewall lymphadenopathy. Reproductive: The prostate gland and  seminal vesicles are unremarkable. Other: No substantial intraperitoneal free fluid. Pigtail drainage catheter in the right paramidline anterior abdomen is stable. No demonstrable fluid collection around the tip of the catheter. Musculoskeletal: Open midline wound again noted. Similar prominent lucencies in the sub cortical bone of the left femoral head, potentially degenerative cysts or component of avascular necrosis. No worrisome lytic or sclerotic osseous abnormality. IMPRESSION: 1. Stable.  No new acute findings in the abdomen or pelvis. 2. Pigtail drainage catheter in the right paramidline anterior abdomen is stable. No demonstrable fluid collection around the tip of the catheter. 3.  Aortic Atherosclerosis (ICD10-I70.0). Electronically Signed   By: Camellia Candle M.D.   On: 04/03/2024 05:32    Microbiology: Recent Results (from the past 240 hours)  Expectorated Sputum Assessment w Gram Stain, Rflx to Resp Cult     Status: None   Collection Time: 04/20/24 11:01 AM   Specimen: Expectorated Sputum  Result Value Ref Range Status   Specimen Description EXPECTORATED SPUTUM  Final   Special Requests NONE  Final   Sputum evaluation   Final     THIS SPECIMEN IS ACCEPTABLE FOR SPUTUM CULTURE Performed at [facility], [address], [place], [place] [address]    Report Status 04/20/2024 FINAL  Final  Culture, Respiratory w Gram Stain     Status: None   Collection Time: 04/20/24 11:01 AM  Result Value Ref Range Status   Specimen Description   Final    EXPECTORATED SPUTUM Performed at [facility], [address], [place], [place] [address]    Special Requests   Final    NONE Reflexed from [id number] Performed at [facility], [address] [place], [place] [address]    Gram Stain   Final    MODERATE WBC PRESENT, PREDOMINANTLY PMN MODERATE GRAM POSITIVE COCCI IN PAIRS FEW GRAM POSITIVE RODS    Culture   Final    Consistent with normal respiratory flora. No Pseudomonas species isolated Performed at [facility], [address], [place], [place] [address]    Report Status 04/22/2024 FINAL  Final  Culture, blood (Routine X 2) w Reflex to ID Panel     Status: None (Preliminary result)   Collection Time: 04/26/24  2:09 PM   Specimen: BLOOD  Result Value Ref Range Status   Specimen Description   Final    BLOOD BLOOD RIGHT ARM Performed at [facility], [address] [place], [place] [address]    Special Requests   Final    BOTTLES DRAWN AEROBIC AND ANAEROBIC Blood Culture results may not be optimal due to an inadequate volume of blood received in culture bottles Performed at [facility], [address] [place], [place] [address]    Culture   Final    NO GROWTH 3 DAYS Performed at [facility], [address] [place], [place] [address]    Report Status PENDING  Incomplete  Culture, blood (Routine X 2) w Reflex to ID Panel     Status: None (Preliminary result)   Collection Time: 04/26/24  2:09 PM   Specimen: BLOOD  Result Value Ref Range Status   Specimen Description   Final    BLOOD BLOOD RIGHT HAND Performed  at [facility], [address], [place], [place] [address]    Special Requests   Final    BOTTLES DRAWN AEROBIC AND ANAEROBIC Blood Culture adequate volume Performed at [facility], [address] [place], [place] [address]    Culture   Final    NO GROWTH 3 DAYS Performed at [facility], [address] [place], [place] [address]    Report Status PENDING  Incomplete     Labs: Basic Metabolic Panel: Recent Labs  Lab 04/24/24 0435 04/26/24 1409 04/27/24 2340 04/28/24 0424 04/29/24 0457 04/30/24 0516  NA 135 134* 132* 136 132* 133*  K 3.5 4.1 4.2 4.7 3.9 3.7  CL 105 102 103 108 108 107  CO2 25 24 21* 20* 15* 16*  GLUCOSE 106* 122* 119* 104* 115* 97  BUN 7* 9 12 14  24* 18  CREATININE 0.67 0.71 0.90 1.16 1.29* 1.04  CALCIUM  8.4* 8.9 8.5* 8.9 9.0 8.7*  MG 2.1 1.8  --  2.2 2.2  --   PHOS  --  3.8  --   --   --   --    Liver Function Tests: Recent Labs  Lab 04/26/24 1409  ALBUMIN  2.3*   No results for input(s): LIPASE, AMYLASE in the last 168 hours. No results for input(s): AMMONIA in the last 168 hours. CBC: Recent Labs  Lab 04/26/24 1409 04/27/24 2340 04/28/24 0424 04/29/24 1001 04/30/24 0516  WBC 7.1 7.9 8.1 6.1 6.7  HGB 8.2* 8.0* 8.2* 8.0* 7.3*  HCT 26.0* 25.8* 27.3* 25.6* 24.3*  MCV 95.2 94.9 98.2 96.6 97.2  PLT 875* 916* 895* 843* 795*      Principal Problem:   Abdominal wall dehiscence Active Problems:   History of pulmonary embolus (PE)   Essential hypertension, benign   Gout   Aortic atherosclerosis (HCC)   Sleep disturbance   Hyperlipidemia   Pulmonary hypertension (HCC)   Chronic thromboembolic disease (HCC)   Ileostomy in place [facility])   History of partial colectomy   Generalized abdominal pain   Colon cancer screening   Colocutaneous fistula   Colitis   Malnutrition of moderate degree   Time coordinating discharge: 45 min  Signed:  Camellia CHRISTELLA Blush, MD [facility]  Surgery, A [facility] [phone] 04/30/2024, 8:40 AM

## 2024-04-23 NOTE — Progress Notes (Signed)
 Physical Therapy Treatment Patient Details Name: Alexander [name] MRN: [id number] DOB: 01-Aug-1961 Today's Date: 04/23/2024   History of Present Illness 63 y.o. male  presented from his general surgeons office secondary to abdominal wall wound dehiscence requiring surgical management. Patient underwent exploratory laparotomy on 7/14 with ileostomy takedown, lysis of adhesions, sigmoid colectomy, diverting loop ileostomy.   PMH: pulmonary embolism, aortic atherosclerosis, peripheral artery disease, diverticulosis, diverticulitis, gout, GERD, hypertension, pulmonary hypertension, right colectomy with end ileostomy.    PT Comments  Patient agreeable to mobilize to recliner from bed. Patient required mod assist to roll and  sit up  to bed edge, +2 to stand with arm hold and pivot to recliner. Patient reports significant abdominal pain.  Patient can benefit from LTAC.     If plan is discharge home, recommend the following: A lot of help with walking and/or transfers;A lot of help with bathing/dressing/bathroom;Assistance with cooking/housework;Assist for transportation;Help with stairs or ramp for entrance   Can travel by private vehicle        Equipment Recommendations  None recommended by PT    Recommendations for Other Services       Precautions / Restrictions Precautions Precautions: Fall Precaution/Restrictions Comments: abd wound/incision dehiscence, ileostomy Restrictions Weight Bearing Restrictions Per Provider Order: No     Mobility  Bed Mobility   Bed Mobility: Rolling, Sidelying to Sit, Sit to Supine, Supine to Sit Rolling: Mod assist Sidelying to sit: Mod assist, HOB elevated   Sit to supine: Supervision   General bed mobility comments: mod assist to roll, pulling onto therapist arm, made half way to sitting then returned back to supine, siting too much pain. HOB raised  and  mod assist to sit up again and move legs over bed edge.    Transfers Overall transfer level:  Needs assistance   Transfers: Bed to chair/wheelchair/BSC, Sit to/from Stand Sit to Stand: Mod assist Stand pivot transfers: +2 physical assistance, +2 safety/equipment        Lateral/Scoot Transfers: Mod assist, Min assist General transfer comment: CNA in to assist. Patient stood with 2 armholds and pivoted to recliner    Ambulation/Gait                   Stairs             Wheelchair Mobility     Tilt Bed    Modified Rankin (Stroke Patients Only)       Balance Overall balance assessment: Needs assistance Sitting-balance support: No upper extremity supported, Feet supported Sitting balance-Leahy Scale: Fair     Standing balance support: Bilateral upper extremity supported, During functional activity   Standing balance comment: reliant on arm support                            Communication Communication Communication: Impaired Factors Affecting Communication: Reduced clarity of speech  Cognition Arousal: Alert Behavior During Therapy: Anxious, Flat affect, Agitated                           PT - Cognition Comments: extra time to work through pain, frequent requests t stop activity for a break, then able to resume Following commands: Intact Following commands impaired: Follows one step commands with increased time, Only follows one step commands consistently    Cueing Cueing Techniques: Verbal cues  Exercises      General Comments        Pertinent  Vitals/Pain Pain Assessment Faces Pain Scale: Hurts whole lot Pain Location: abd with movement, Pain Descriptors / Indicators: Grimacing, Guarding, Moaning Pain Intervention(s): Monitored during session, Premedicated before session, Repositioned    Home Living                          Prior Function            PT Goals (current goals can now be found in the care plan section) Progress towards PT goals: Progressing toward goals    Frequency    Min  2X/week      PT Plan      Co-evaluation              AM-PAC PT 6 Clicks Mobility   Outcome Measure  Help needed turning from your back to your side while in a flat bed without using bedrails?: A Lot Help needed moving from lying on your back to sitting on the side of a flat bed without using bedrails?: A Lot Help needed moving to and from a bed to a chair (including a wheelchair)?: A Lot Help needed standing up from a chair using your arms (e.g., wheelchair or bedside chair)?: A Lot Help needed to walk in hospital room?: Total Help needed climbing 3-5 steps with a railing? : Total 6 Click Score: 10    End of Session   Activity Tolerance: Patient tolerated treatment well;Patient limited by pain Patient left: in chair;with chair alarm set;with call bell/phone within reach Nurse Communication: Mobility status PT Visit Diagnosis: Other abnormalities of gait and mobility (R26.89)     Time: 1020-1035 PT Time Calculation (min) (ACUTE ONLY): 15 min  Charges:    $Therapeutic Activity: 8-22 mins PT General Charges $$ ACUTE PT VISIT: 1 Visit                     Darice Potters PT Acute Rehabilitation Services Office [phone]    Potters Darice Norris 04/23/2024, 12:26 PM

## 2024-04-23 NOTE — TOC Progression Note (Addendum)
 Transition of Care [facility]) - Progression Note    Patient Details  Name: Alexander [name] MRN: [id number] Date of Birth: [birth year]  Transition [facility]) CM/SW Contact  Alfonse JONELLE Rex, RN Phone Number: 04/23/2024, 11:22 AM  Clinical Narrative:  Kristeen, liaison with [facility] confirmed patient accepted for LTAC, bed available, will initiate auth today.   -11:29am Call to [facility] to cancel SNF request approval,  spoke with Sherron MATSU, Ruby states she is unable to cancel the request but new authorization request for another facility with a different admit date should no interfere with new auth request. Ciera w/[facility] initiating LTAC auth, await determination.     Expected Discharge Plan: Home/Self Care Barriers to Discharge: Continued Medical Work up               Expected Discharge Plan and Services   Discharge Planning Services: CM Consult   Living arrangements for the past 2 months: Single Family Home                                       Social Drivers of Health (SDOH) Interventions SDOH Screenings   Food Insecurity: No Food Insecurity (04/10/2024)  Housing: High Risk (04/10/2024)  Transportation Needs: No Transportation Needs (04/10/2024)  Recent Concern: Transportation Needs - Unmet Transportation Needs (03/30/2024)  Utilities: Not At Risk (04/10/2024)  Depression (PHQ2-9): Low Risk  (07/24/2023)  Tobacco Use: Medium Risk (04/13/2024)    Readmission Risk Interventions    04/10/2024    1:55 PM 01/24/2024   10:43 AM 01/13/2024    9:13 AM  Readmission Risk Prevention Plan  Transportation Screening Complete Complete Complete  PCP or Specialist Appt within 3-5 Days  Complete Complete  HRI or Home Care Consult  Complete Complete  Social Work Consult for Recovery Care Planning/Counseling  Complete Complete  Palliative Care Screening  Not Applicable Not Applicable  Medication Review Oceanographer) Complete Complete Complete  PCP or Specialist appointment within  3-5 days of discharge Complete    HRI or Home Care Consult Complete    SW Recovery Care/Counseling Consult Complete    Palliative Care Screening Not Applicable    Skilled Nursing Facility Not Applicable

## 2024-04-23 NOTE — Progress Notes (Signed)
 OT Cancellation Note  Patient Details Name: Alexander [name] MRN: [id number] DOB: 09-15-1961   Cancelled Treatment:    Reason Eval/Treat Not Completed: Patient declined, no reason specified. Pt received in recliner, declining to participate in therapy at this time.   Amanda Pote L. Chadd Tollison, OTR/L  04/23/24, 2:09 PM

## 2024-04-23 NOTE — Consult Note (Signed)
 WOC Nurse wound follow up Wound type: surgical; Dr. Tanda at bedside with WOC today to assess wound Measurement: NA Wound azi:fnduob yellow/black slough, further separation of the fascia  Drainage (amount, consistency, odor) strong feculent odor Periwound: intact  Dressing procedure/placement/frequency: Line wound bed with Mepitel over sutures  Fluff fill with saline damp gauze and top with ABD pad, secure with tape.   Ostomy pouch intact from yesterday's change, emptied 250 liquid green stool from the pouch and notified bedside nurse to add to output.  Discussed Immodium regimen with Dr. Tanda  Discussed rationale for LTAC with patient.  Patient has opted to not return for any further surgeries during this admission.   Dr. Tanda at bedside to explain wound and risk and benefits based on current state.   Discussed discharge plan with bedside nursing and [facility] staff   Omega Durante [facility] MSN, RN,CWOCN, CNS, CWON-AP [phone]

## 2024-04-23 NOTE — Plan of Care (Signed)

## 2024-04-24 LAB — PROTIME-INR
INR: 2.7 — ABNORMAL HIGH (ref 0.8–1.2)
Prothrombin Time: 30.1 s — ABNORMAL HIGH (ref 11.4–15.2)

## 2024-04-24 LAB — BASIC METABOLIC PANEL WITH GFR
Anion gap: 5 (ref 5–15)
BUN: 7 mg/dL — ABNORMAL LOW (ref 8–23)
CO2: 25 mmol/L (ref 22–32)
Calcium: 8.4 mg/dL — ABNORMAL LOW (ref 8.9–10.3)
Chloride: 105 mmol/L (ref 98–111)
Creatinine, Ser: 0.67 mg/dL (ref 0.61–1.24)
GFR, Estimated: >60 mL/min (ref >60)
Glucose, Bld: 106 mg/dL — ABNORMAL HIGH (ref 70–99)
Potassium: 3.5 mmol/L (ref 3.5–5.1)
Sodium: 135 mmol/L (ref 135–145)

## 2024-04-24 LAB — MAGNESIUM: Magnesium: 2.1 mg/dL (ref 1.7–2.4)

## 2024-04-24 MED ORDER — WARFARIN SODIUM 2.5 MG PO TABS
5.0000 mg | ORAL_TABLET | Freq: Once | ORAL | Status: AC
  Administered 2024-04-24: 5 mg via ORAL
  Filled 2024-04-24: qty 2

## 2024-04-24 NOTE — Progress Notes (Addendum)
 11 Days Post-Op  Pt seen & examined at around 0715 Subjective/Chief Complaint: Pain ok Has tolerated soft without n/v Having liquid ostomy output  No fevers since last weekend No tachy Pt ready for next phase of care - LTAC  Rounded with pt nurse susan   Objective: Vital signs in last 24 hours: Temp:  [98.4 F (36.9 C)-99.4 F (37.4 C)] 98.5 F (36.9 C) (07/25 2051) Pulse Rate:  [77-92] 82 (07/25 2051) Resp:  [16-17] 16 (07/25 2051) BP: (128-132)/(70-82) 128/70 (07/25 2051) SpO2:  [98 %-100 %] 98 % (07/25 2051) Last BM Date : 04/24/24  Intake/Output from previous day: 07/24 0701 - 07/25 0700 In: 600 [P.O.:600] Out: 1950 [Urine:150; Stool:1800] Intake/Output this shift: Total I/O In: 0  Out: 350 [Urine:200; Stool:150]  Awake, alert, nontoxic Symm chest rise  reg Some distension, liquid stool in bag;  Fair amount of fascia separation at this point.  There is xposed small bowel but it is intact.  There is some fibrinous exudate along the fascial edges some of which is nonviable  No edema; no calf TTP   Lab Results:  Recent Labs    04/22/24 0751 04/23/24 0508  WBC 11.0* 11.8*  HGB 7.3* 7.3*  HCT 24.1* 23.7*  PLT 525* 599*   BMET Recent Labs    04/22/24 0751 04/24/24 0435  NA 139 135  K 3.6 3.5  CL 107 105  CO2 22 25  GLUCOSE 93 106*  BUN 8 7*  CREATININE 0.56* 0.67  CALCIUM  8.4* 8.4*   PT/INR Recent Labs    04/23/24 0508 04/24/24 0435  LABPROT 28.6* 30.1*  INR 2.5* 2.7*   ABG No results for input(s): PHART, HCO3 in the last 72 hours.  Invalid input(s): PCO2, PO2   Studies/Results: No results found.    Anti-infectives: Anti-infectives (From admission, onward)    Start     Dose/Rate Route Frequency Ordered Stop   04/13/24 0900  cefoTEtan  (CEFOTAN ) 2 g in sodium chloride  0.9 % 100 mL IVPB        2 g 200 mL/hr over 30 Minutes Intravenous On call to O.R. 04/10/24 0838 04/13/24 1109       Assessment/Plan: history of  right colectomy with end ileostomy for perforated bleeding diverticulitis April 2025  abdominal dehiscence with exposed bowel   s/p Procedure(s): ILEOSTOMY TAKEDOWN (N/A) LYSIS, ADHESIONS 2 hours (N/A) SIGMOIDCOLECTOMY WITH DIVERTING LOOP ILEOSTOMY (N/A) LAPAROTOMY, EXPLORATORY (N/A) SPLENIC FLEXURE MOBILIZATION 04/13/24 EW   Recurrent Fascial dehiscence -he has had more progressive separation of the fascia.  There is some exposed small bowel but it is intact.  Pls see progress note from 7/24 documenting extensive discussion with pt regarding his fascial dehiscence - pros/cons of nonop mgmt for now vs return to OR and the risks with/without surgery. Pt declined surgery.   Wound care midline - mepital, moist kerlix, dry dressing   GI -see below, ostomy teaching;   There is no signs of enteric drainage; imodium  2mg  q day; will increase to bid since ostomy output trending up.  FEN -  KVO ivf 7/22 and see if pt keeps up with fluid intake, hypokalemia -resolved.  soft diet/protein shakes. Calorie count started completed; may need to titrate Imodium  as needed for high ostomy output Pulm - incentive spirometry, OOB Heme - hgb down some hgb 10.2-->8.4-->8.1-->7.8--8.7-->7.4-->7.5-->7.3, repeat cbc in am      H/o VTE - hep gtt stopped, switched to therapeutic Lovenox ; pharmacist stopped therapeutic Lovenox  today,  INR 2.7 on  Coumadin , mgmt  per pharm; Coumadin  per pharmacy Foley - none Pain -scheduled oral tylenol , , as needed oxycodone  and IV Dilaudid ;  scheduled low-dose gabapentin ;  increased dose of gabapentin  7/22 Pt/ot eval  ID/Fever-patient started spiking temperatures day 1 week ago.  Ua negative. CXR showed infiltrate - Right; now afebrile x 72hrs without tachy; the small bump in WBC on Sunday I think was due to hemoconcentration given change in hgb/hct. Will hold off on abx for now but will get sputum culture- cx - normal flora; small bump in wbc 7/23 & 24 -  I think the white blood cell  count is coming from the fascia.  He is not tachycardic.  He is not ill-appearing.  Dispo-re-explained the patient that given the complexity of his midline wound he is no longer a candidate for skilled nursing facility in my opinion.  He has agreed to go to LTAC for ongoing medical management of his midline wound and ostomy care and nursing support. [facility] has accepted him but we awaiting insurance approval. Numerous messages with TOC throughout 7/25 - Manufacturing engineer is reviewing case.   [name], MD, FACS General, Bariatric, & Minimally Invasive Surgery [facility],  A [facility]   LOS: 15 days    Camellia Tanda 04/24/2024

## 2024-04-24 NOTE — Progress Notes (Signed)
 PHARMACY - ANTICOAGULATION CONSULT NOTE  Pharmacy Consult for Warfarin Indication: history of VTE  No Known Allergies  Patient Measurements: Height: 5' 9 (175.3 cm) Weight: 76 kg (167 lb 8.8 oz) IBW/kg (Calculated) : 70.7 HEPARIN  DW (KG): 72.8  Vital Signs: Temp: 98.4 F (36.9 C) (07/25 0518) Temp Source: Oral (07/25 0518) BP: 132/81 (07/25 0518) Pulse Rate: 77 (07/25 0518)  Labs: Recent Labs    04/22/24 0751 04/23/24 0508 04/24/24 0435  HGB 7.3* 7.3*  --   HCT 24.1* 23.7*  --   PLT 525* 599*  --   LABPROT 30.8* 28.6* 30.1*  INR 2.8* 2.5* 2.7*  CREATININE 0.56*  --  0.67    Estimated Creatinine Clearance: 95.7 mL/min (by C-G formula based on SCr of 0.67 mg/dL).  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 PMH significant for emergent right colectomy with end ileostomy for perforated diverticulitis in April 2025 with post-op bleeding complications. Pt underwent ex lap with ileostomy takedown, sigmoid colectomy with diverting loop ileostomy on 04/13/24.   Pt was bridged peri-operatively with UFH while warfarin was on hold. Warfarin was resumed on 7/18 - pharmacy consulted to dose.   Significant Events/Anticoag History this Admission: 7/13: s/p colonoscopy 7/14: s/p ileostomy takedown, sigmoid colectomy with diverting loop ileostomy.    -Bridged with UFH perioperatively. On 7/17, UFH transitioned to LMWH bridge due to difficulty obtaining blood for lab monitoring.   Today, 04/24/24 INR 2.7, remains therapeutic  Dose held on 7/23 by CCS pending procedural plans.  Now, no further procedures planned and Ok to continue.  CBC: Hgb 7.3 - low/stable, PLT 599 -  high (last on 7/24) No bleeding reported Major DDI: None PO intake: 50-100 % of meals + supplements   Goal of Therapy:  INR 2-3   Plan:  Warfarin  5mg  PO today at 16:00  Daily INR CBC q72h  Follow up discharge plans   Wanda Hasting PharmD, BCPS [facility] main pharmacy [phone] 04/24/2024 12:25 PM

## 2024-04-24 NOTE — Progress Notes (Signed)
 Mobility Specialist - Progress Note   04/24/24 0903  Mobility  Activity Stood at bedside  Assistive Device Front wheel walker  Activity Response Tolerated well  Mobility Referral Yes  Mobility visit 1 Mobility  Mobility Specialist Start Time (ACUTE ONLY) 0848  Mobility Specialist Stop Time (ACUTE ONLY) [id number]  Mobility Specialist Time Calculation (min) (ACUTE ONLY) 15 min   Pt received in bed and agreeable to mobility. Once standing pt gauze fell out. RN notified. Assisted back into bed. Pt to bed after session with all needs met.    [facility]

## 2024-04-25 LAB — CBC
HCT: 27.7 % — ABNORMAL LOW (ref 39.0–52.0)
Hemoglobin: 8.2 g/dL — ABNORMAL LOW (ref 13.0–17.0)
MCH: 29.2 pg (ref 26.0–34.0)
MCHC: 29.6 g/dL — ABNORMAL LOW (ref 30.0–36.0)
MCV: 98.6 fL (ref 80.0–100.0)
Platelets: 823 K/uL — ABNORMAL HIGH (ref 150–400)
RBC: 2.81 MIL/uL — ABNORMAL LOW (ref 4.22–5.81)
RDW: 16.2 % — ABNORMAL HIGH (ref 11.5–15.5)
WBC: 8 K/uL (ref 4.0–10.5)
nRBC: 0 % (ref 0.0–0.2)

## 2024-04-25 LAB — PROTIME-INR
INR: 3.3 — ABNORMAL HIGH (ref 0.8–1.2)
Prothrombin Time: 34.9 s — ABNORMAL HIGH (ref 11.4–15.2)

## 2024-04-25 MED ORDER — WARFARIN SODIUM 1 MG PO TABS
1.0000 mg | ORAL_TABLET | Freq: Once | ORAL | Status: AC
  Administered 2024-04-25: 1 mg via ORAL
  Filled 2024-04-25: qty 1

## 2024-04-25 NOTE — Plan of Care (Signed)
  Problem: Clinical Measurements: Goal: Ability to maintain clinical measurements within normal limits will improve Outcome: Progressing   Problem: Activity: Goal: Risk for activity intolerance will decrease Outcome: Progressing   Problem: Nutrition: Goal: Adequate nutrition will be maintained Outcome: Progressing   Problem: Pain Managment: Goal: General experience of comfort will improve and/or be controlled Outcome: Progressing   Problem: Safety: Goal: Ability to remain free from injury will improve Outcome: Progressing   Problem: Skin Integrity: Goal: Risk for impaired skin integrity will decrease Outcome: Progressing

## 2024-04-25 NOTE — Progress Notes (Signed)
 Mobility Specialist - Progress Note   04/25/24 1000  Mobility  Activity Ambulated with assistance in hallway  Level of Assistance Minimal assist, patient does 75% or more  Assistive Device Front wheel walker  Distance Ambulated (ft) 20 ft  Range of Motion/Exercises Active Assistive  Activity Response Tolerated well  Mobility Referral Yes  Mobility visit 1 Mobility  Mobility Specialist Start Time (ACUTE ONLY) 0945  Mobility Specialist Stop Time (ACUTE ONLY) 1000  Mobility Specialist Time Calculation (min) (ACUTE ONLY) 15 min   Pt was found in bed wanting to ambulate. Pt grew fatigued with session. NT followed with recliner chair. At EOS was wheeled back to room. Was left with NT in room.  Alexander [name],  Mobility Specialist Can be reached via Secure Chat

## 2024-04-25 NOTE — Progress Notes (Signed)
 12 Days Post-Op   Subjective/Chief Complaint: No significant changes WBC improved Hgb improved Ostomy output Awaiting LTAC  Objective: Vital signs in last 24 hours: Temp:  [98.5 F (36.9 C)-99.4 F (37.4 C)] 98.9 F (37.2 C) (07/26 0618) Pulse Rate:  [82-92] 90 (07/26 0618) Resp:  [16-17] 17 (07/26 0618) BP: (121-129)/(70-82) 121/70 (07/26 0618) SpO2:  [98 %-100 %] 99 % (07/26 0618) Weight:  [72.9 kg] 72.9 kg (07/26 0517) Last BM Date : 04/25/24  Intake/Output from previous day: 07/25 0701 - 07/26 0700 In: 1020 [P.O.:1020] Out: 1425 [Urine:400; Stool:1025] Intake/Output this shift: Total I/O In: 240 [P.O.:240] Out: 300 [Urine:200; Stool:100]  Awake, alert, nontoxic Symm chest rise  reg Some distension, liquid stool in bag;  Fair amount of fascia separation at this point.  There is xposed small bowel but it is intact.  There is some fibrinous exudate along the fascial edges some of which is nonviable    Lab Results:  Recent Labs    04/23/24 0508 04/25/24 0453  WBC 11.8* 8.0  HGB 7.3* 8.2*  HCT 23.7* 27.7*  PLT 599* 823*   BMET Recent Labs    04/24/24 0435  NA 135  K 3.5  CL 105  CO2 25  GLUCOSE 106*  BUN 7*  CREATININE 0.67  CALCIUM  8.4*   PT/INR Recent Labs    04/24/24 0435 04/25/24 0453  LABPROT 30.1* 34.9*  INR 2.7* 3.3*     Anti-infectives: Anti-infectives (From admission, onward)    Start     Dose/Rate Route Frequency Ordered Stop   04/13/24 0900  cefoTEtan  (CEFOTAN ) 2 g in sodium chloride  0.9 % 100 mL IVPB        2 g 200 mL/hr over 30 Minutes Intravenous On call to O.R. 04/10/24 0838 04/13/24 1109       Assessment/Plan: s/p Procedure(s): ILEOSTOMY TAKEDOWN (N/A) LYSIS, ADHESIONS 2 hours (N/A) SIGMOIDCOLECTOMY WITH DIVERTING LOOP ILEOSTOMY (N/A) LAPAROTOMY, EXPLORATORY (N/A)  Awaiting LTAC Continue current regimen  LOS: 16 days    Donnice MARLA Lima 04/25/2024

## 2024-04-25 NOTE — Progress Notes (Signed)
 PHARMACY - ANTICOAGULATION CONSULT NOTE  Pharmacy Consult for Warfarin Indication: history of VTE  No Known Allergies  Patient Measurements: Height: 5' 9 (175.3 cm) Weight: 72.9 kg (160 lb 11.5 oz) IBW/kg (Calculated) : 70.7 HEPARIN  DW (KG): 72.8  Vital Signs: Temp: 98.9 F (37.2 C) (07/26 0618) Temp Source: Oral (07/26 0618) BP: 121/70 (07/26 0618) Pulse Rate: 90 (07/26 0618)  Labs: Recent Labs    04/23/24 0508 04/24/24 0435 04/25/24 0453  HGB 7.3*  --  8.2*  HCT 23.7*  --  27.7*  PLT 599*  --  823*  LABPROT 28.6* 30.1* 34.9*  INR 2.5* 2.7* 3.3*  CREATININE  --  0.67  --     Estimated Creatinine Clearance: 95.7 mL/min (by C-G formula based on SCr of 0.67 mg/dL).  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 PMH significant for emergent right colectomy with end ileostomy for perforated diverticulitis in April 2025 with post-op bleeding complications. Pt underwent ex lap with ileostomy takedown, sigmoid colectomy with diverting loop ileostomy on 04/13/24.   Pt was bridged peri-operatively with UFH while warfarin was on hold. Warfarin was resumed on 7/18 - pharmacy consulted to dose.   Significant Events/Anticoag History this Admission: 7/13: s/p colonoscopy 7/14: s/p ileostomy takedown, sigmoid colectomy with diverting loop ileostomy.    -Bridged with UFH perioperatively. On 7/17, UFH transitioned to LMWH bridge due to difficulty obtaining blood for lab monitoring.   Today, 04/25/24 INR 3.3, increased to supra-therapeutic  Dose held on 7/23 by CCS pending procedural plans, but no further procedures planned and resumed on 7/24. CBC: Hgb inc to 8.2, Plt elevated to 823 No bleeding reported Major DDI: None PO intake: 50-100 % of meals + supplements.  Stool output decreasing (1800 mL down to 1025  mL)   Goal of Therapy:  INR 2-3   Plan:  Warfarin 1mg  PO today at 16:00  Daily INR CBC q72h  Follow up discharge plans   Wanda Hasting PharmD, BCPS [facility] main pharmacy [phone] 04/25/2024 10:01 AM

## 2024-04-25 NOTE — Plan of Care (Signed)
 Pt affect irritable and cooperative. He is tolerating schedule meds well. Current c/o severe pain being managed with prn and scheduled pain medications. Dressing change performed at 430. Contact precautions in place. Will continue to monitor.

## 2024-04-26 ENCOUNTER — Inpatient Hospital Stay (HOSPITAL_COMMUNITY)

## 2024-04-26 LAB — CBC
HCT: 26 % — ABNORMAL LOW (ref 39.0–52.0)
Hemoglobin: 8.2 g/dL — ABNORMAL LOW (ref 13.0–17.0)
MCH: 30 pg (ref 26.0–34.0)
MCHC: 31.5 g/dL (ref 30.0–36.0)
MCV: 95.2 fL (ref 80.0–100.0)
Platelets: 875 K/uL — ABNORMAL HIGH (ref 150–400)
RBC: 2.73 MIL/uL — ABNORMAL LOW (ref 4.22–5.81)
RDW: 16.3 % — ABNORMAL HIGH (ref 11.5–15.5)
WBC: 7.1 K/uL (ref 4.0–10.5)
nRBC: 0 % (ref 0.0–0.2)

## 2024-04-26 LAB — RENAL FUNCTION PANEL
Albumin: 2.3 g/dL — ABNORMAL LOW (ref 3.5–5.0)
Anion gap: 8 (ref 5–15)
BUN: 9 mg/dL (ref 8–23)
CO2: 24 mmol/L (ref 22–32)
Calcium: 8.9 mg/dL (ref 8.9–10.3)
Chloride: 102 mmol/L (ref 98–111)
Creatinine, Ser: 0.71 mg/dL (ref 0.61–1.24)
GFR, Estimated: >60 mL/min (ref >60)
Glucose, Bld: 122 mg/dL — ABNORMAL HIGH (ref 70–99)
Phosphorus: 3.8 mg/dL (ref 2.5–4.6)
Potassium: 4.1 mmol/L (ref 3.5–5.1)
Sodium: 134 mmol/L — ABNORMAL LOW (ref 135–145)

## 2024-04-26 LAB — MAGNESIUM: Magnesium: 1.8 mg/dL (ref 1.7–2.4)

## 2024-04-26 LAB — PROTIME-INR
INR: 3 — ABNORMAL HIGH (ref 0.8–1.2)
Prothrombin Time: 32.7 s — ABNORMAL HIGH (ref 11.4–15.2)

## 2024-04-26 MED ORDER — WARFARIN SODIUM 2.5 MG PO TABS
2.5000 mg | ORAL_TABLET | Freq: Once | ORAL | Status: AC
  Administered 2024-04-26: 2.5 mg via ORAL
  Filled 2024-04-26: qty 1

## 2024-04-26 MED ORDER — IBUPROFEN 400 MG PO TABS
600.0000 mg | ORAL_TABLET | Freq: Four times a day (QID) | ORAL | Status: DC | PRN
  Administered 2024-04-26 – 2024-04-27 (×2): 600 mg via ORAL
  Filled 2024-04-26 (×2): qty 1

## 2024-04-26 MED ORDER — MAGNESIUM SULFATE 2 GM/50ML IV SOLN
2.0000 g | Freq: Once | INTRAVENOUS | Status: AC
  Administered 2024-04-26: 2 g via INTRAVENOUS
  Filled 2024-04-26: qty 50

## 2024-04-26 NOTE — Progress Notes (Signed)
 13 Days Post-Op   Subjective/Chief Complaint: Patient is unhappy about still being in the hospital No new issues Ostomy output - 1050 cc   Objective: Vital signs in last 24 hours: Temp:  [98.3 F (36.8 C)-98.9 F (37.2 C)] 98.9 F (37.2 C) (07/27 0424) Pulse Rate:  [91-98] 98 (07/27 0424) Resp:  [16-18] 18 (07/27 0424) BP: (99-130)/(62-85) 99/85 (07/27 0424) SpO2:  [96 %-100 %] 100 % (07/27 0424) Weight:  [69.9 kg] 69.9 kg (07/27 0500) Last BM Date : 04/25/24  Intake/Output from previous day: 07/26 0701 - 07/27 0700 In: 960 [P.O.:960] Out: 1800 [Urine:750; Stool:1050] Intake/Output this shift: No intake/output data recorded.  Awake, alert, NAD Some distension, liquid stool in bag;  Fair amount of fascia separation at this point.  There is exposed small bowel but it is intact.  There is some fibrinous exudate along the fascial edges some of which is nonviable  Lab Results:  Recent Labs    04/25/24 0453  WBC 8.0  HGB 8.2*  HCT 27.7*  PLT 823*   BMET Recent Labs    04/24/24 0435  NA 135  K 3.5  CL 105  CO2 25  GLUCOSE 106*  BUN 7*  CREATININE 0.67  CALCIUM  8.4*   PT/INR Recent Labs    04/25/24 0453 04/26/24 0435  LABPROT 34.9* 32.7*  INR 3.3* 3.0*     Anti-infectives: Anti-infectives (From admission, onward)    Start     Dose/Rate Route Frequency Ordered Stop   04/13/24 0900  cefoTEtan  (CEFOTAN ) 2 g in sodium chloride  0.9 % 100 mL IVPB        2 g 200 mL/hr over 30 Minutes Intravenous On call to O.R. 04/10/24 0838 04/13/24 1109       Assessment/Plan: s/p Procedure(s): ILEOSTOMY TAKEDOWN (N/A) LYSIS, ADHESIONS 2 hours (N/A) SIGMOIDCOLECTOMY WITH DIVERTING LOOP ILEOSTOMY (N/A) LAPAROTOMY, EXPLORATORY (N/A) Awaiting LTAC May need further debridement/ retention sutures  LOS: 17 days    Alexander [name] 04/26/2024

## 2024-04-26 NOTE — Plan of Care (Signed)

## 2024-04-26 NOTE — Plan of Care (Signed)

## 2024-04-26 NOTE — Progress Notes (Signed)
   04/26/24 1440  Notify: Rapid Response  Name of Rapid Response RN Notified Jaynie Fragmin, RN (Filled ICU RN on details of pt's conditions, and temp decreasing. Jaynie said she would keep an eye out for pt.)  Date Rapid Response Notified 04/26/24  Time Rapid Response Notified 1440

## 2024-04-26 NOTE — Progress Notes (Signed)
   04/26/24 1319  Assess: MEWS Score  Temp (!) 102.7 F (39.3 C)  Pulse Rate (!) 112  SpO2 98 %  Assess: MEWS Score  MEWS Temp 2  MEWS Systolic 0  MEWS Pulse 2  MEWS RR 0  MEWS LOC 0  MEWS Score 4  MEWS Score Color Red  Assess: if the MEWS score is Yellow or Red  Were vital signs accurate and taken at a resting state? Yes  Does the patient meet 2 or more of the SIRS criteria? No  Does the patient have a confirmed or suspected source of infection? Yes  Provider Notification  Provider Name/Title Dr Richerd Silversmith  Date Provider Notified 04/26/24  Time Provider Notified 1320  Method of Notification Page  Notification Reason Change in status (Fever and increased HR)  Provider response See new orders  Date of Provider Response 04/26/24  Time of Provider Response 1325  Assess: SIRS CRITERIA  SIRS Temperature  1  SIRS Respirations  0  SIRS Pulse 1  SIRS WBC 0  SIRS Score Sum  2
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
## 2024-04-27 ENCOUNTER — Inpatient Hospital Stay (HOSPITAL_COMMUNITY)

## 2024-04-27 LAB — CBC
HCT: 25.8 % — ABNORMAL LOW (ref 39.0–52.0)
Hemoglobin: 8 g/dL — ABNORMAL LOW (ref 13.0–17.0)
MCH: 29.4 pg (ref 26.0–34.0)
MCHC: 31 g/dL (ref 30.0–36.0)
MCV: 94.9 fL (ref 80.0–100.0)
Platelets: 916 K/uL (ref 150–400)
RBC: 2.72 MIL/uL — ABNORMAL LOW (ref 4.22–5.81)
RDW: 16.3 % — ABNORMAL HIGH (ref 11.5–15.5)
WBC: 7.9 K/uL (ref 4.0–10.5)
nRBC: 0 % (ref 0.0–0.2)

## 2024-04-27 LAB — PROTIME-INR
INR: 2.8 — ABNORMAL HIGH (ref 0.8–1.2)
Prothrombin Time: 30.5 s — ABNORMAL HIGH (ref 11.4–15.2)

## 2024-04-27 MED ORDER — IOHEXOL 300 MG/ML  SOLN
100.0000 mL | Freq: Once | INTRAMUSCULAR | Status: AC | PRN
  Administered 2024-04-27: 100 mL via INTRAVENOUS

## 2024-04-27 MED ORDER — SODIUM CHLORIDE 0.9 % IV BOLUS
500.0000 mL | Freq: Once | INTRAVENOUS | Status: AC
  Administered 2024-04-27: 500 mL via INTRAVENOUS

## 2024-04-27 MED ORDER — WARFARIN SODIUM 2.5 MG PO TABS
5.0000 mg | ORAL_TABLET | Freq: Once | ORAL | Status: AC
  Administered 2024-04-27: 5 mg via ORAL
  Filled 2024-04-27: qty 2

## 2024-04-27 MED ORDER — PIPERACILLIN-TAZOBACTAM 3.375 G IVPB
3.3750 g | Freq: Three times a day (TID) | INTRAVENOUS | Status: DC
  Administered 2024-04-27 – 2024-04-30 (×9): 3.375 g via INTRAVENOUS
  Filled 2024-04-27 (×10): qty 50

## 2024-04-27 MED ORDER — IOHEXOL 9 MG/ML PO SOLN: 500.0000 mL | ORAL | Status: AC

## 2024-04-27 NOTE — Plan of Care (Signed)

## 2024-04-27 NOTE — Progress Notes (Signed)
   04/27/24 2215  Assess: MEWS Score  Temp (!) 101 F (38.3 C)  BP 108/60  MAP (mmHg) 72  Pulse Rate (!) 129  Resp 18  SpO2 94 %  O2 Device Room Air  Assess: MEWS Score  MEWS Temp 1  MEWS Systolic 0  MEWS Pulse 2  MEWS RR 0  MEWS LOC 0  MEWS Score 3  MEWS Score Color Yellow  Assess: if the MEWS score is Yellow or Red  Were vital signs accurate and taken at a resting state? Yes  Does the patient meet 2 or more of the SIRS criteria? Yes  Does the patient have a confirmed or suspected source of infection? Yes  MEWS guidelines implemented  Yes, yellow  Treat  MEWS Interventions Considered administering scheduled or prn medications/treatments as ordered  Take Vital Signs  Increase Vital Sign Frequency  Yellow: Q2hr x1, continue Q4hrs until patient remains green for 12hrs  Escalate  MEWS: Escalate Yellow: Discuss with charge nurse and consider notifying provider and/or RRT  Notify: Charge Nurse/RN  Name of Charge Nurse/RN Notified Adriana,RN  Provider Notification  Provider Name/Title Deward Toth,MD  Date Provider Notified 04/27/24  Time Provider Notified 2223  Method of Notification Page  Notification Reason Change in status (yellow mews d/t tachy)  Provider response See new orders  Date of Provider Response 04/27/24  Time of Provider Response 2223  Assess: SIRS CRITERIA  SIRS Temperature  1  SIRS Respirations  0  SIRS Pulse 1  SIRS WBC 0  SIRS Score Sum  2

## 2024-04-27 NOTE — Progress Notes (Signed)
 PT Cancellation Note  Patient Details Name: Alexander [name] MRN: [id number] DOB: Jan 29, 1961   Cancelled Treatment:     Attempted to see x 2 AM about to go downstairs for imaging PM declined all offers for any OOB activity.  I was downstairs and now I am hurting  Katheryn Leap  PTA Acute  Rehabilitation Services Office M-F          [phone]

## 2024-04-27 NOTE — Progress Notes (Signed)
 14 Days Post-Op  Pt seen & examined at around 0715 Subjective/Chief Complaint: Pain could be better Has tolerated soft without n/v Having liquid ostomy output  Had some recurrent fevers over the weekend with some intermittent tachy Pt not happy about still being in the hospital.  Ostomy output about 1650 for past 24 hours  Objective: Vital signs in last 24 hours: Temp:  [97.7 F (36.5 C)-102.7 F (39.3 C)] 97.9 F (36.6 C) (07/28 0643) Pulse Rate:  [84-114] 98 (07/28 0643) Resp:  [16-20] 16 (07/28 0643) BP: (103-125)/(51-66) 103/62 (07/28 0643) SpO2:  [96 %-100 %] 96 % (07/28 0643) Weight:  [68.8 kg] 68.8 kg (07/28 0720) Last BM Date : 04/27/24  Intake/Output from previous day: 07/27 0701 - 07/28 0700 In: 969.3 [P.O.:960; IV Piggyback:9.3] Out: 2500 [Urine:850; Stool:1650] Intake/Output this shift: Total I/O In: 120 [P.O.:120] Out: 400 [Urine:300; Stool:100]  Awake, alert, nontoxic Symm chest rise  reg Some distension, liquid stool in bag;   No edema; no calf TTP   Lab Results:  Recent Labs    04/25/24 0453 04/26/24 1409  WBC 8.0 7.1  HGB 8.2* 8.2*  HCT 27.7* 26.0*  PLT 823* 875*   BMET Recent Labs    04/26/24 1409  NA 134*  K 4.1  CL 102  CO2 24  GLUCOSE 122*  BUN 9  CREATININE 0.71  CALCIUM  8.9   PT/INR Recent Labs    04/26/24 0435 04/27/24 0429  LABPROT 32.7* 30.5*  INR 3.0* 2.8*   ABG No results for input(s): PHART, HCO3 in the last 72 hours.  Invalid input(s): PCO2, PO2   Studies/Results: DG CHEST PORT 1 VIEW Result Date: 04/26/2024 CLINICAL DATA:  Fever. EXAM: PORTABLE CHEST 1 VIEW COMPARISON:  Chest radiograph dated 04/19/2024. FINDINGS: Improved aeration of the lungs. Bilateral infrahilar densities may represent residual infiltrate or atelectasis. No pleural effusion pneumothorax. Mild cardiomegaly. No acute osseous pathology. IMPRESSION: Improved aeration of the lungs. Bilateral infrahilar densities may represent  residual infiltrate or atelectasis. Electronically Signed   By: Vanetta Chou M.D.   On: 04/26/2024 14:16      Anti-infectives: Anti-infectives (From admission, onward)    Start     Dose/Rate Route Frequency Ordered Stop   04/13/24 0900  cefoTEtan  (CEFOTAN ) 2 g in sodium chloride  0.9 % 100 mL IVPB        2 g 200 mL/hr over 30 Minutes Intravenous On call to O.R. 04/10/24 0838 04/13/24 1109       Assessment/Plan: history of right colectomy with end ileostomy for perforated bleeding diverticulitis April 2025  abdominal dehiscence with exposed bowel   s/p Procedure(s): ILEOSTOMY TAKEDOWN (N/A) LYSIS, ADHESIONS 2 hours (N/A) SIGMOIDCOLECTOMY WITH DIVERTING LOOP ILEOSTOMY (N/A) LAPAROTOMY, EXPLORATORY (N/A) SPLENIC FLEXURE MOBILIZATION 04/13/24 EW   Recurrent Fascial dehiscence -he has had more progressive separation of the fascia since early last week  There is some exposed small bowel but it is intact.  Pls see progress note from 7/24 documenting extensive discussion with pt regarding his fascial dehiscence - pros/cons of nonop mgmt for now vs return to OR and the risks with/without surgery. Pt declined surgery.   I again discussed his complex incisional wound with him on 7/28.  I explained that given the fascia separation that his muscle cannot really be closed at this time.  Options are continued complex wound care at the bedside versus return to the OR with attempts to debride some of the nonviable fascia and place biologic mesh or Vicryl mesh to try to  protect the viscera.  Discussed again pros and cons of each approach.  Discussed potential fistula formation even with nonoperative management.  Discussed potential injury to the bowel with return to the OR.  Discussed that neither 1 of these options would result in his abdomen being completely closed.  Exam today it appears the small bowel was pretty fixed to the fascia so I am not sure I can get enough undermining to place  biologic/Vicryl mesh.  I do not think raising skin flaps and trying to just close the skin over the exposed bowel is an option either.  I would be very concerned about lack of blood flow to the skin and having skin necrosis if we tried to just raise skin and close the skin over the exposed bowel.  Either way the patient has declined returning to the operating room.  His main goal is to get to the next phase of care.  I explained that sometimes that may be quicker with return to the OR to stabilize his wound.  But again he is declining return to the OR.  I again discussed the case this morning with several of my partners showing them the picture of his fascia.  Again majority opinion is to just do local wound care at this point given the timing.  A few discussed potential return to the OR with bridging with the fascia if there is a plane there.  But again patient is declining return to the OR  Wound care midline - mepital, moist kerlix, dry dressing   GI -see below, ostomy teaching;   There is no signs of enteric drainage; imodium  2mg  bid;  FEN -  KVO ivf 7/22 and see if pt keeps up with fluid intake, hypokalemia -resolved.  soft diet/protein shakes. Calorie count completed last week; may need to titrate Imodium  as needed for high ostomy output Pulm - incentive spirometry, OOB Heme - hgb down some hgb 10.2-->8.4-->8.1-->7.8--8.7-->7.4-->7.5-->7.3--->8.2, repeat cbc in am      H/o VTE - hep gtt stopped, switched to therapeutic Lovenox ; pharmacist stopped therapeutic Lovenox  today,  INR 2.8 on  Coumadin , mgmt per pharm; Coumadin  per pharmacy Foley - none Pain -scheduled oral tylenol , , as needed oxycodone  and IV Dilaudid ;  scheduled low-dose gabapentin ;  increased dose of gabapentin  7/22 Pt/ot eval  ID/Fever-patient started spiking temperatures day 1 week ago.  Ua negative. CXR showed infiltrate - Right; now afebrile x 72hrs without tachy; the small bump in WBC on Sunday I think was due to  hemoconcentration given change in hgb/hct. Will hold off on abx for now but will get sputum culture- cx - normal flora; small bump in wbc 7/23 & 24 -  I think the white blood cell count is coming from the fascia.  Over the weekend of the 25th he had some recurrent temperatures up to 102.  White count is still normal. AP chest x-ray on 7/27 was improved.  Blood cultures from 7/27 negative to date.  Will check CT abdomen pelvis.  Patient  declining oral contrast.  Dispo-re-explained the patient that given the complexity of his midline wound he is no longer a candidate for skilled nursing facility in my opinion.  Patient is still declining option of returning to the OR.  At this point I am not sure if return to the OR is even a good viable option since we are at 2 weeks out.  Have discussed with patient potentially reapplication of the wound VAC with Mepitel, white sponge and black  sponge to help clean up some of the fascia and soft tissue.  He is going to think about it.  I think we can probably do some gentle debridement at the bedside and removed the fascial sutures that are exposed since they are not really doing anything at this point.  Received permission from patient to update family.  He states that the son was on a cruise and that I could update his sister.  I called and spoke to the sister and went over his postoperative wound issues and the complications associated with it.  Went over with her that he is declined additional surgery and that now or just having to do complex wound care management which limits his facilities that he could be transferred to.  I asked and answered her questions  [name], MD, FACS General, Bariatric, & Minimally Invasive Surgery [facility],  A [facility]   LOS: 18 days    Camellia Tanda 04/27/2024

## 2024-04-27 NOTE — Plan of Care (Signed)

## 2024-04-27 NOTE — Consult Note (Addendum)
 WOC Nurse ostomy follow up Stoma type/location: RLQ loop ileostomy Stomal assessment/size: 32 mm round, red and viable, slight above the skin level. Peristomal assessment:  Treatment options for stomal/peristomal skin: Ring 2 Output aprox. 80 ml on pouch at 0800. Ostomy pouching: 1pc. #[id number], ring [id number]. Education provided: Pt is already tech about ostomy care. He insisted on maintaining his routine when I change his pouch. I talked to him, asking to trust on me, given confidence about ostomy care. He nod.  FOR SNF: [facility] item numbers  1pc soft convex   [phone] 2 barrier ring [id number]  Enrolled patient in [facility] Discharge program: Yes previously.  WOC Nurse Consult Note: The bed nurse warned him about the plans to apply a wound VAC with Mepitel and white foam. I was bed side when the pt refuses to do. Discussing with medical team about next steps. Note: using wet to dry dressing at this point.   WOC team will follow further. Please reconsult if further assistance is needed. Thank-you,  Lela Holm BSN, CNS, RN, [employer], WOCN  (Phone [phone])

## 2024-04-27 NOTE — Progress Notes (Signed)
 Spoke with Alexander [name] at [facility] to schedule a peer to peer They requested a 2 Hr window for a call back 7/29- 10 am - 12pm (I'm in surgery most of Tuesday with downtime bt cases; hopefully they will call during turnover) Wed 7/30 12-2 pm   [name], MD, FACS General, Bariatric, & Minimally Invasive Surgery [name],  A [facility]
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
## 2024-04-27 NOTE — Progress Notes (Signed)
 OT Cancellation Note  Patient Details Name: Alexander [name] MRN: [id number] DOB: Mar 19, 1961   Cancelled Treatment:    Reason Eval/Treat Not Completed: Patient declined, no reason specified. Attempted to see pt twice today. OT will follow up next available time  Jacques Karna Loose 04/27/2024, 3:10 PM

## 2024-04-28 LAB — CBC
HCT: 27.3 % — ABNORMAL LOW (ref 39.0–52.0)
Hemoglobin: 8.2 g/dL — ABNORMAL LOW (ref 13.0–17.0)
MCH: 29.5 pg (ref 26.0–34.0)
MCHC: 30 g/dL (ref 30.0–36.0)
MCV: 98.2 fL (ref 80.0–100.0)
Platelets: 895 K/uL — ABNORMAL HIGH (ref 150–400)
RBC: 2.78 MIL/uL — ABNORMAL LOW (ref 4.22–5.81)
RDW: 16.2 % — ABNORMAL HIGH (ref 11.5–15.5)
WBC: 8.1 K/uL (ref 4.0–10.5)
nRBC: 0 % (ref 0.0–0.2)

## 2024-04-28 LAB — MAGNESIUM: Magnesium: 2.2 mg/dL (ref 1.7–2.4)

## 2024-04-28 LAB — BASIC METABOLIC PANEL WITH GFR
Anion gap: 8 (ref 5–15)
Anion gap: 8 (ref 5–15)
BUN: 12 mg/dL (ref 8–23)
BUN: 14 mg/dL (ref 8–23)
CO2: 20 mmol/L — ABNORMAL LOW (ref 22–32)
CO2: 21 mmol/L — ABNORMAL LOW (ref 22–32)
Calcium: 8.5 mg/dL — ABNORMAL LOW (ref 8.9–10.3)
Calcium: 8.9 mg/dL (ref 8.9–10.3)
Chloride: 103 mmol/L (ref 98–111)
Chloride: 108 mmol/L (ref 98–111)
Creatinine, Ser: 0.9 mg/dL (ref 0.61–1.24)
Creatinine, Ser: 1.16 mg/dL (ref 0.61–1.24)
GFR, Estimated: >60 mL/min (ref >60)
GFR, Estimated: >60 mL/min (ref >60)
Glucose, Bld: 104 mg/dL — ABNORMAL HIGH (ref 70–99)
Glucose, Bld: 119 mg/dL — ABNORMAL HIGH (ref 70–99)
Potassium: 4.2 mmol/L (ref 3.5–5.1)
Potassium: 4.7 mmol/L (ref 3.5–5.1)
Sodium: 132 mmol/L — ABNORMAL LOW (ref 135–145)
Sodium: 136 mmol/L (ref 135–145)

## 2024-04-28 LAB — PROTIME-INR
INR: 2.4 — ABNORMAL HIGH (ref 0.8–1.2)
Prothrombin Time: 27.7 s — ABNORMAL HIGH (ref 11.4–15.2)

## 2024-04-28 MED ORDER — POTASSIUM CHLORIDE IN NACL 20-0.9 MEQ/L-% IV SOLN: INTRAVENOUS | Status: DC

## 2024-04-28 MED ORDER — IBUPROFEN 200 MG PO TABS: 200.0000 mg | ORAL_TABLET | Freq: Three times a day (TID) | ORAL | Status: DC | PRN

## 2024-04-28 MED ORDER — METHOCARBAMOL 1000 MG/10ML IJ SOLN
500.0000 mg | Freq: Three times a day (TID) | INTRAMUSCULAR | Status: DC
  Administered 2024-04-28 – 2024-04-29 (×3): 500 mg via INTRAVENOUS
  Filled 2024-04-28 (×3): qty 10

## 2024-04-28 MED ORDER — WARFARIN SODIUM 2.5 MG PO TABS
5.0000 mg | ORAL_TABLET | Freq: Once | ORAL | Status: AC
  Administered 2024-04-28: 5 mg via ORAL
  Filled 2024-04-28: qty 2

## 2024-04-28 MED ORDER — ZINC SULFATE 220 (50 ZN) MG PO CAPS
220.0000 mg | ORAL_CAPSULE | Freq: Every day | ORAL | Status: DC
  Administered 2024-04-28 – 2024-04-29 (×2): 220 mg via ORAL
  Filled 2024-04-28 (×4): qty 1

## 2024-04-28 MED ORDER — SODIUM CHLORIDE 0.9 % IV SOLN: INTRAVENOUS | Status: DC

## 2024-04-28 MED ORDER — VITAMIN C 500 MG PO TABS
500.0000 mg | ORAL_TABLET | Freq: Two times a day (BID) | ORAL | Status: DC
  Administered 2024-04-28 – 2024-04-30 (×4): 500 mg via ORAL
  Filled 2024-04-28 (×4): qty 1

## 2024-04-28 MED ORDER — OXYCODONE HCL 5 MG PO TABS
10.0000 mg | ORAL_TABLET | ORAL | Status: DC | PRN
  Administered 2024-04-28 – 2024-04-30 (×8): 10 mg via ORAL
  Filled 2024-04-28 (×8): qty 2

## 2024-04-28 MED ORDER — OXYCODONE HCL 5 MG PO TABS
5.0000 mg | ORAL_TABLET | ORAL | Status: DC | PRN
  Administered 2024-04-29: 5 mg via ORAL
  Filled 2024-04-28: qty 1

## 2024-04-28 NOTE — Plan of Care (Signed)

## 2024-04-28 NOTE — Progress Notes (Signed)
 Did peer to peer earlier today with Dr. Pasco Leyden medical director.  He approved patient stay at [facility]  See my progress note from earlier today.  I recommended replacement of the wound VAC so I think that will help evacuate some of the intra-abdominal fluid/abscess that is actively draining through his dehisced wound.  Patient was agreeable to this.      I removed most of the remaining fascial sutures.  Primarily because they were completely almost pulled apart and one of them was starting to get slightly embedded in the exposed viscera.   I then placed a large sheet of Mepitel at the base of the wound where the bowel is exposed with some extending up the edges.  Then white foam and then a black sponge.  Plastic drapes were then applied in typical fashion.  Wound VAC was connected to continuous suction at 75 mmHg.  We had a good seal.  There was no air leak.   Right now and the plan will [facility] he is due for his wound VAC change since they have to inspect the abdominal wound on arrival.  I discussed with the patient and he is agreeable with this plan.  [name], MD, FACS General, Bariatric, & Minimally Invasive Surgery [facility],  A [facility]
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
## 2024-04-28 NOTE — Progress Notes (Signed)
 15 Days Post-Op  Pt seen & examined at around 0715 Subjective/Chief Complaint: Pain could be better Has tolerated soft without n/v Having liquid ostomy output  Had some recurrent fevers over the weekend with some intermittent tachy  Ostomy output about 900 for past 24 hours  Objective: Vital signs in last 24 hours: Temp:  [97.2 F (36.2 C)-101 F (38.3 C)] 97.3 F (36.3 C) (07/29 0812) Pulse Rate:  [86-129] 86 (07/29 0812) Resp:  [16-18] 16 (07/29 0812) BP: (96-126)/(53-68) 96/53 (07/29 0812) SpO2:  [94 %-100 %] 100 % (07/29 0812) Last BM Date : 04/27/24  Intake/Output from previous day: 07/28 0701 - 07/29 0700 In: 1190.7 [P.O.:1080; IV Piggyback:110.7] Out: 2300 [Urine:1400; Stool:900] Intake/Output this shift: Total I/O In: 240 [P.O.:240] Out: 450 [Stool:450]  Awake, alert, nontoxic Symm chest rise  reg Some distension, liquid stool in bag;   No edema; no calf TTP   Lab Results:  Recent Labs    04/27/24 2340 04/28/24 0424  WBC 7.9 8.1  HGB 8.0* 8.2*  HCT 25.8* 27.3*  PLT 916* 895*   BMET Recent Labs    04/27/24 2340 04/28/24 0424  NA 132* 136  K 4.2 4.7  CL 103 108  CO2 21* 20*  GLUCOSE 119* 104*  BUN 12 14  CREATININE 0.90 1.16  CALCIUM  8.5* 8.9   PT/INR Recent Labs    04/27/24 0429 04/28/24 0424  LABPROT 30.5* 27.7*  INR 2.8* 2.4*   ABG No results for input(s): PHART, HCO3 in the last 72 hours.  Invalid input(s): PCO2, PO2   Studies/Results: CT ABDOMEN PELVIS W CONTRAST Result Date: 04/27/2024 CLINICAL DATA:  Abdominal pain, partial colectomy and wound dehiscence with bowel extension through laparotomy dehiscence requiring resections, drainage through dehiscence site. EXAM: CT ABDOMEN AND PELVIS WITH CONTRAST TECHNIQUE: Multidetector CT imaging of the abdomen and pelvis was performed using the standard protocol following bolus administration of intravenous contrast. RADIATION DOSE REDUCTION: This exam was performed according  to the departmental dose-optimization program which includes automated exposure control, adjustment of the mA and/or kV according to patient size and/or use of iterative reconstruction technique. CONTRAST:  OMNIPAQUE  IOHEXOL  300 MG/ML  SOLN COMPARISON:  04/09/2024 FINDINGS: Lower chest: Airspace opacity with volume loss in both lower lobes primarily due to atelectasis. Mild cardiomegaly. Right coronary artery atheromatous vascular calcification. Hepatobiliary: Unremarkable Pancreas: Unremarkable Spleen: Unremarkable Adrenals/Urinary Tract: Mild scarring of the left kidney upper pole medially, unchanged from prior. Otherwise unremarkable. Stomach/Bowel: Postoperative findings including a right loop ileostomy (image 50, series 2), right hemicolectomy with ileotransverse anastomosis (image 32 series 2), and prior partial sigmoid colectomy with sigmoid anastomosis on image 66 series 2. There is moderate residual descending and sigmoid colon diverticulosis. No dilated bowel. Large midline wound with separation of the rectus abdominus musculature and subcutaneous tissues, measuring about 15.8 cm vertically and with the gap between the rectus abdominus musculature about 8.5 cm on image 45 series 2. There is a collection of fluid continuous with this wound extending deep to the right rectus abdominus muscle and presumably along the right anterior peritoneal margin on image 56 series 2 measuring about 8.0 by 2.0 by 7.8 cm (volume = 65 cm^3). This is likely spontaneously draining into the wound, although does extend along the anterior peritoneal margin. There is a vertical tubular collection extraluminal gas and fluid in the left paracolic gutter extending up from the vertical level of the spleen down into the pelvis and within anterior extension towards the midline wound along the peritoneal  surface for example on image 54 series 2. This measures roughly about 70 cc in volume. Appearance compatible with an abscess  although this could be spontaneously draining to the peritoneal wound. There a flat collection gas and fluid extending from the upper margin of the laparotomy wound anterior to the liver, measuring about 9.8 by 1.1 by 9.5 cm (volume = 54 cm^3) and shown on image 74 series 6. As with the other collections, the appearance is suspicious for an abscess and in this case resembles free intraperitoneal gas adjacent to the liver, but also may well be draining to the abdominal wound collection and accordingly may be a contained collection. Finally, in the right abdomen just below the liver and gallbladder fossa, a 5.0 by 4.1 by 3.2 cm collection of gas shown on image 37 series 6 could well be within bowel but could also represent a abscess cavity based on morphology. The patient refused oral contrast for this exam which makes this collection ambiguous. None of the collections are definitively shown to have a connection to bowel. No pneumatosis identified. No portal venous gas. Vascular/Lymphatic: Mild aortoiliac atheromatous vascular calcification. Atheromatous vascular calcification is more striking along the pelvic outflow region. Reproductive: Unremarkable Other: Trace presacral edema. Musculoskeletal: Mild bridging spurring of the left sacroiliac joint anteriorly. Avascular necrosis of the left femoral head with some associated cortical discontinuity suggesting early collapse/fracture. This is stable from 04/09/2024. Lower lumbar spondylosis and degenerative disc disease causing bilateral foraminal impingement at L4-5 and L5-S1. IMPRESSION: 1. Large midline wound with separation of the rectus abdominus musculature and subcutaneous tissues, measuring about 15.8 cm vertically and with the gap between the rectus abdominus musculature about 8.5 cm. 2. There is a collection of fluid continuous with this wound extending deep to the right rectus abdominus muscle and presumably along the right anterior peritoneal margin measuring  about 8.0 by 2.0 by 7.8 cm (volume = 65 cc). This is likely spontaneously draining into the midline abdominal wound, although does extend along the anterior peritoneal margin. 3. There is a vertical tubular collection of extraluminal gas and fluid in the left paracolic gutter extending up from the vertical level of the spleen down into the pelvis and with anterior extension towards the midline wound along the peritoneal surface. This measures roughly about 70 cc in volume. Appearance compatible with an abscess although this could be spontaneously draining to the midline abdominal wound. 4. There a flat collection of gas and fluid extending from the upper margin of the laparotomy wound anterior to the liver, measuring about 9.8 by 1.1 by 9.5 cm (volume = 54 cc). As with the other collections, the appearance is suspicious for an abscess and in this case resembles free intraperitoneal gas adjacent to the liver, but also may well be draining to the abdominal wound collection and accordingly may be a contained collection. 5. In the right abdomen just below the liver and gallbladder fossa, a 5.0 by 4.1 by 3.2 cm collection of gas shown could well be within bowel but could also represent abscess cavity based on morphology. The patient refused oral contrast for this exam which makes this collection ambiguous. 6. Airspace opacity with volume loss in both lower lobes primarily due to atelectasis. 7. Mild cardiomegaly. Right coronary artery atheromatous vascular calcification. 8. Avascular necrosis of the left femoral head with some associated cortical discontinuity suggesting early collapse/fracture. This is stable from 04/09/2024. 9. Lower lumbar spondylosis and degenerative disc disease causing bilateral foraminal impingement at L4-5 and L5-S1. 10.  Mild scarring of the left kidney upper pole medially, unchanged from prior. 11.  Aortic Atherosclerosis (ICD10-I70.0). Electronically Signed   By: Ryan Salvage M.D.   On:  04/27/2024 15:07   DG CHEST PORT 1 VIEW Result Date: 04/26/2024 CLINICAL DATA:  Fever. EXAM: PORTABLE CHEST 1 VIEW COMPARISON:  Chest radiograph dated 04/19/2024. FINDINGS: Improved aeration of the lungs. Bilateral infrahilar densities may represent residual infiltrate or atelectasis. No pleural effusion pneumothorax. Mild cardiomegaly. No acute osseous pathology. IMPRESSION: Improved aeration of the lungs. Bilateral infrahilar densities may represent residual infiltrate or atelectasis. Electronically Signed   By: Vanetta Chou M.D.   On: 04/26/2024 14:16      Anti-infectives: Anti-infectives (From admission, onward)    Start     Dose/Rate Route Frequency Ordered Stop   04/27/24 1530  piperacillin -tazobactam (ZOSYN ) IVPB 3.375 g        3.375 g 12.5 mL/hr over 240 Minutes Intravenous Every 8 hours 04/27/24 1440     04/13/24 0900  cefoTEtan  (CEFOTAN ) 2 g in sodium chloride  0.9 % 100 mL IVPB        2 g 200 mL/hr over 30 Minutes Intravenous On call to O.R. 04/10/24 0838 04/13/24 1109       Assessment/Plan: history of right colectomy with end ileostomy for perforated bleeding diverticulitis April 2025  abdominal dehiscence with exposed bowel   s/p Procedure(s): ILEOSTOMY TAKEDOWN (N/A) LYSIS, ADHESIONS 2 hours (N/A) SIGMOIDCOLECTOMY WITH DIVERTING LOOP ILEOSTOMY (N/A) LAPAROTOMY, EXPLORATORY (N/A) SPLENIC FLEXURE MOBILIZATION 04/13/24 EW   Recurrent Fascial dehiscence -he has had more progressive separation of the fascia since early last week  There is some exposed small bowel but it is intact.  Pls see progress note from 7/24 & 7/28 documenting extensive discussion with pt regarding his fascial dehiscence - pros/cons of nonop mgmt for now vs return to OR and the risks with/without surgery. Pt declined surgery.    Wound care midline -will reapply wound VAC with mepital, white sponge and then black sponge   GI -see below, ostomy teaching;   There is no signs of enteric drainage;  imodium  2mg  bid;  FEN -  KVO ivf 7/22 and see if pt keeps up with fluid intake, hypokalemia -resolved.  soft diet/protein shakes. Calorie count completed last week; may need to titrate Imodium  as needed for high ostomy output; given IV contrast yesterday and small bump in Cr - will place back on IVF temporarily.  Pulm - incentive spirometry, OOB Heme - hgb down some hgb 10.2-->8.4-->8.1-->7.8--8.7-->7.4-->7.5-->7.3--->8.2, repeat cbc in am      H/o VTE - hep gtt stopped, switched to therapeutic Lovenox ; pharmacist stopped therapeutic Lovenox  today,  INR 2.4 on  Coumadin , mgmt per pharm; Coumadin  per pharmacy Foley - none Pain -scheduled oral tylenol , , as needed oxycodone  and IV Dilaudid ;  scheduled low-dose gabapentin ;  increased dose of gabapentin  7/22; changed robaxin  to scheduled on 7/29.  Patient only got oxycodone  and appears twice yesterday.  Discussed with patient and nurse that he may do better with more frequent oxycodone  then frequent Dilaudid . Pt/ot eval  ID/Fever-patient started spiking temperatures day 1 week ago.  Ua negative. CXR showed infiltrate - Right; now afebrile x 72hrs without tachy; the small bump in WBC on Sunday I think was due to hemoconcentration given change in hgb/hct. Will hold off on abx for now but will get sputum culture- cx - normal flora; small bump in wbc 7/23 & 24 -  I think the white blood cell count is coming from the  fascia.  Over the weekend of the 25th he had some recurrent temperatures up to 102.  White count is still normal. AP chest x-ray on 7/27 was improved.  Blood cultures from 7/27 negative to date.  Will check CT abdomen pelvis.  Patient  declining oral contrast.  CT yesterday showed some scattered intra-abdominal fluid collection/abscesses.  There is no sign of obstruction or free air.  On clinical exam there is no signs of perforation.  I think he is decompressing the intra-abdominal fluid collections through his open midline.  I think by replacing the  wound VAC this will help with that process. Zosyn  7/28-->  Dispo-re-explained the patient that given the complexity of his midline wound he is no longer a candidate for skilled nursing facility in my opinion.   Awaiting peer to peer with his insurance company   [name], MD, FACS General, Bariatric, & Minimally Invasive Surgery [facility],  A [facility]   LOS: 19 days    Camellia Tanda 04/28/2024

## 2024-04-28 NOTE — TOC Progression Note (Signed)
 Transition of Care [facility]) - Progression Note    Patient Details  Name: Alexander [name] MRN: [id number] Date of Birth: [birth year]  [facility] CM/SW Contact  Alfonse JONELLE Rex, RN Phone Number: 04/28/2024, 2:21 PM  Clinical Narrative: Teams chat from [place] w[facility]- approved for [facility] after peer to peer. We can admit today. Room number is 5E01, accepting MD is Dr. Fleeta Finger. MD to MD report can be called to [phone]. RN to RN report can be called to the same number. MD states plan for wound vac placement today, plan admit tomorrow or Thursday after monitoring of wound vac. TOC will continue to follow.        Expected Discharge Plan: Home/Self Care Barriers to Discharge: Continued Medical Work up               Expected Discharge Plan and Services   Discharge Planning Services: CM Consult   Living arrangements for the past 2 months: Single Family Home                                       Social Drivers of Health (SDOH) Interventions SDOH Screenings   Food Insecurity: No Food Insecurity (04/10/2024)  Housing: High Risk (04/10/2024)  Transportation Needs: No Transportation Needs (04/10/2024)  Recent Concern: Transportation Needs - Unmet Transportation Needs (03/30/2024)  Utilities: Not At Risk (04/10/2024)  Depression (PHQ2-9): Low Risk  (07/24/2023)  Tobacco Use: Medium Risk (04/13/2024)    Readmission Risk Interventions    04/10/2024    1:55 PM 01/24/2024   10:43 AM 01/13/2024    9:13 AM  Readmission Risk Prevention Plan  Transportation Screening Complete Complete Complete  PCP or Specialist Appt within 3-5 Days  Complete Complete  HRI or Home Care Consult  Complete Complete  Social Work Consult for Recovery Care Planning/Counseling  Complete Complete  Palliative Care Screening  Not Applicable Not Applicable  Medication Review Oceanographer) Complete Complete Complete  PCP or Specialist appointment within 3-5 days of discharge Complete     HRI or Home Care Consult Complete    SW Recovery Care/Counseling Consult Complete    Palliative Care Screening Not Applicable    Skilled Nursing Facility Not Applicable

## 2024-04-28 NOTE — Progress Notes (Signed)
 OT Cancellation Note  Patient Details Name: Alexander [name] MRN: [id number] DOB: 06-Jan-1961   Cancelled Treatment:    Reason Eval/Treat Not Completed: Fatigue/lethargy limiting ability to participate  2x OT attempted visit with refusal even when he confirmed to work around 1 pm. I was already up on the side of the bed and not I don't feel good. OT will continue to follow as schedule allows.   Abel Hageman OT/L Acute Rehabilitation Department  [phone]    04/28/2024, 1:33 PM

## 2024-04-28 NOTE — Progress Notes (Signed)
 Nutrition Follow-up  DOCUMENTATION CODES:   Non-severe (moderate) malnutrition in context of chronic illness  INTERVENTION:  - Soft diet per MD.  -Boost Breeze po TID, each supplement provides 250 kcal and 9 grams of protein    -Multivitamin with minerals daily. - Add 500mg  vitamin C  BID and 220mg  zinc  to support wound healing.   NUTRITION DIAGNOSIS:   Moderate Malnutrition related to chronic illness as evidenced by moderate fat depletion, moderate muscle depletion, percent weight loss. *ongoing  GOAL:   Patient will meet greater than or equal to 90% of their needs *progressing  MONITOR:   PO intake, Supplement acceptance  REASON FOR ASSESSMENT:   Consult Calorie Count  ASSESSMENT:   63 y.o. male with a history of pulmonary embolism, aortic atherosclerosis, peripheral artery disease, diverticulosis, diverticulitis, gout, GERD, hypertension, pulmonary hypertension, right colectomy with end ileostomy.  Patient presented from his general surgeons office secondary to abdominal wall wound dehiscence requiring surgical management.  Patient underwent exploratory laparotomy on 7/14 with ileostomy takedown, lysis of adhesions, sigmoid colectomy, diverting loop ileostomy.  7/10: admitted 7/12: CLD -> FLD 7/13: NPO -> FLD, s/p colonoscopy 7/14: NPO, s/p ileostomy takedown, LOA, sigmoid colectomy, diverting loop ileostomy 7/16: CL[facility] 7/17: FLD 7/19: Soft diet  Patient consuming 0-100% of meals, average of only 39% over the past 4 days. He is consuming Boost Breeze about 1-2 times per day.  Patient reports he doesn't need any food right now and has been eating. Admits to skipping meals. However, reports he is continuing to drink [employer] and still likes it. Reiterated importance of adequate nutrition to promote healing. Patient agreeable to receive vitamin C  and zinc  to support wound healing. Patient likely not meeting needs to support healing however patient often refusing  care (PT/OT) and currently also declining further surgery. Do not feel he would be agreeable to [facility].    Admission weight: 156 lbs Current weight: 151 lbs I&O's: -2.7L since 7/15   Medications: Imodium , Multivitamin with minerals daily, Coumadin    Labs reviewed: -  Diet Order:   Diet Order             DIET SOFT Fluid consistency: Thin  Diet effective now                   EDUCATION NEEDS:  Not appropriate for education at this time  Skin:  Skin Assessment: Skin Integrity Issues: Skin Integrity Issues:: Other (Comment) Other: surgical wound -abdomen  Last BM:  7/28 - ileostomy  Height:  Ht Readings from Last 1 Encounters:  04/13/24 5' 9 (1.753 m)   Weight:  Wt Readings from Last 1 Encounters:  04/27/24 68.8 kg    BMI:  Body mass index is 22.4 kg/m.  Estimated Nutritional Needs:  Kcal:  2150-2350 Protein:  110-130g Fluid:  2L/day    Trude Ned RD, LDN Contact via Secure Chat.

## 2024-04-29 ENCOUNTER — Other Ambulatory Visit: Payer: Self-pay | Admitting: Physician Assistant

## 2024-04-29 DIAGNOSIS — Z7901 Long term (current) use of anticoagulants: Secondary | ICD-10-CM

## 2024-04-29 LAB — BASIC METABOLIC PANEL WITH GFR
Anion gap: 9 (ref 5–15)
BUN: 24 mg/dL — ABNORMAL HIGH (ref 8–23)
CO2: 15 mmol/L — ABNORMAL LOW (ref 22–32)
Calcium: 9 mg/dL (ref 8.9–10.3)
Chloride: 108 mmol/L (ref 98–111)
Creatinine, Ser: 1.29 mg/dL — ABNORMAL HIGH (ref 0.61–1.24)
GFR, Estimated: >60 mL/min (ref >60)
Glucose, Bld: 115 mg/dL — ABNORMAL HIGH (ref 70–99)
Potassium: 3.9 mmol/L (ref 3.5–5.1)
Sodium: 132 mmol/L — ABNORMAL LOW (ref 135–145)

## 2024-04-29 LAB — CBC
HCT: 25.6 % — ABNORMAL LOW (ref 39.0–52.0)
Hemoglobin: 8 g/dL — ABNORMAL LOW (ref 13.0–17.0)
MCH: 30.2 pg (ref 26.0–34.0)
MCHC: 31.3 g/dL (ref 30.0–36.0)
MCV: 96.6 fL (ref 80.0–100.0)
Platelets: 843 K/uL — ABNORMAL HIGH (ref 150–400)
RBC: 2.65 MIL/uL — ABNORMAL LOW (ref 4.22–5.81)
RDW: 16.4 % — ABNORMAL HIGH (ref 11.5–15.5)
WBC: 6.1 K/uL (ref 4.0–10.5)
nRBC: 0 % (ref 0.0–0.2)

## 2024-04-29 LAB — PROTIME-INR
INR: 3.2 — ABNORMAL HIGH (ref 0.8–1.2)
Prothrombin Time: 34.6 s — ABNORMAL HIGH (ref 11.4–15.2)

## 2024-04-29 LAB — MAGNESIUM: Magnesium: 2.2 mg/dL (ref 1.7–2.4)

## 2024-04-29 LAB — VITAMIN B12: Vitamin B-12: 387 pg/mL (ref 180–914)

## 2024-04-29 MED ORDER — SODIUM CHLORIDE 0.9 % IV SOLN: INTRAVENOUS | Status: DC

## 2024-04-29 MED ORDER — METHOCARBAMOL 500 MG PO TABS
500.0000 mg | ORAL_TABLET | Freq: Three times a day (TID) | ORAL | Status: DC
  Administered 2024-04-29 – 2024-04-30 (×3): 500 mg via ORAL
  Filled 2024-04-29 (×3): qty 1

## 2024-04-29 MED ORDER — PSYLLIUM 95 % PO PACK
1.0000 | PACK | Freq: Every day | ORAL | Status: DC
  Filled 2024-04-29: qty 1

## 2024-04-29 NOTE — Plan of Care (Signed)

## 2024-04-29 NOTE — Progress Notes (Signed)
 16 Days Post-Op  Pt seen & examined at around 0715 Subjective/Chief Complaint: Pain still could be better Has tolerated soft without n/v Having liquid ostomy output  Wound vac applied yesterday pM  Cr up a little today Having IV issues and maintIVF haven't been running  Ostomy output about 1550 for past 24 hours  Objective: Vital signs in last 24 hours: Temp:  [98.3 F (36.8 C)-99.6 F (37.6 C)] 98.5 F (36.9 C) (07/30 0653) Pulse Rate:  [90-111] 90 (07/30 0653) Resp:  [14-16] 16 (07/30 0653) BP: (89-102)/(54-58) 89/57 (07/30 0653) SpO2:  [98 %-100 %] 99 % (07/30 0653) Weight:  [55 kg] 55 kg (07/30 0500) Last BM Date : 04/29/24  Intake/Output from previous day: 07/29 0701 - 07/30 0700 In: 1300 [P.O.:780; I.V.:380.7; IV Piggyback:139.3] Out: 1550 [Stool:1550] Intake/Output this shift: Total I/O In: 360 [P.O.:360] Out: 600 [Urine:400; Stool:200]  Awake, alert, nontoxic Symm chest rise  reg Some distension, liquid stool in bag; wound vac in place, good seal, no enteric contents in vac cannister  No edema; no calf TTP   Lab Results:  Recent Labs    04/28/24 0424 04/29/24 1001  WBC 8.1 6.1  HGB 8.2* 8.0*  HCT 27.3* 25.6*  PLT 895* 843*   BMET Recent Labs    04/28/24 0424 04/29/24 0457  NA 136 132*  K 4.7 3.9  CL 108 108  CO2 20* 15*  GLUCOSE 104* 115*  BUN 14 24*  CREATININE 1.16 1.29*  CALCIUM  8.9 9.0   PT/INR Recent Labs    04/28/24 0424 04/29/24 0457  LABPROT 27.7* 34.6*  INR 2.4* 3.2*   ABG No results for input(s): PHART, HCO3 in the last 72 hours.  Invalid input(s): PCO2, PO2   Studies/Results: No results found.     Anti-infectives: Anti-infectives (From admission, onward)    Start     Dose/Rate Route Frequency Ordered Stop   04/27/24 1530  piperacillin -tazobactam (ZOSYN ) IVPB 3.375 g        3.375 g 12.5 mL/hr over 240 Minutes Intravenous Every 8 hours 04/27/24 1440     04/13/24 0900  cefoTEtan  (CEFOTAN ) 2 g in  sodium chloride  0.9 % 100 mL IVPB        2 g 200 mL/hr over 30 Minutes Intravenous On call to O.R. 04/10/24 0838 04/13/24 1109       Assessment/Plan: history of right colectomy with end ileostomy for perforated bleeding diverticulitis April 2025  abdominal dehiscence with exposed bowel   s/p Procedure(s): ILEOSTOMY TAKEDOWN (N/A) LYSIS, ADHESIONS 2 hours (N/A) SIGMOIDCOLECTOMY WITH DIVERTING LOOP ILEOSTOMY (N/A) LAPAROTOMY, EXPLORATORY (N/A) SPLENIC FLEXURE MOBILIZATION 04/13/24 EW   Recurrent Fascial dehiscence -he has had more progressive separation of the fascia since early last week  There is some exposed small bowel but it is intact.  Pls see progress note from 7/24 & 7/28 documenting extensive discussion with pt regarding his fascial dehiscence - pros/cons of nonop mgmt for now vs return to OR and the risks with/without surgery. Pt declined surgery.    Wound care midline - wound VAC applied 7/29 with mepital, white sponge and then black sponge @  GI -see below, ostomy teaching;   There is no signs of enteric drainage; imodium  2mg  bid; add Fiber;  on MVI FEN -   regular diet/protein shakes. Calorie count completed last week; may need to titrate Imodium  as needed for high ostomy output; given IV contrast 2 days ago  and small bump in Cr - placed back on IVF temporarily.  But haven't been running due to pIV issues. Discussed with nurse Pulm - incentive spirometry, OOB Heme - hgb down some hgb 10.2-->8.4-->8.1-->7.8--8.7-->7.4-->7.5-->7.3--->8.2, repeat cbc in am      H/o VTE - hep gtt stopped, switched to therapeutic Lovenox ; pharmacist stopped therapeutic Lovenox  today,  INR 2.4-->3.2 on  Coumadin , mgmt per pharm; Coumadin  per pharmacy Foley - none Pain -scheduled oral tylenol , , as needed oxycodone  and IV Dilaudid ;  scheduled low-dose gabapentin ;  increased dose of gabapentin  7/22; changed robaxin  to scheduled on 7/29.  Patient only got oxycodone  and appears twice yesterday.   Discussed with patient and nurse that he may do better with more frequent oxycodone  then frequent Dilaudid . Pt/ot eval  ID/Fever-patient started spiking temperatures day 1 week ago.  Ua negative. CXR showed infiltrate - Right; now afebrile x 72hrs without tachy; the small bump in WBC on Sunday I think was due to hemoconcentration given change in hgb/hct. Will hold off on abx for now but will get sputum culture- cx - normal flora; small bump in wbc 7/23 & 24 -  I think the white blood cell count is coming from the fascia.  Over the weekend of the 25th he had some recurrent temperatures up to 102.  White count is still normal. AP chest x-ray on 7/27 was improved.  Blood cultures from 7/27 negative to date.  Will check CT abdomen pelvis.  Patient  declining oral contrast.  CT yesterday showed some scattered intra-abdominal fluid collection/abscesses.  There is no sign of obstruction or free air.  On clinical exam there is no signs of perforation.  I think he is decompressing the intra-abdominal fluid collections through his open midline.  I think by replacing the wound VAC this will help with that process. Zosyn  7/28-->  Dispo-re-explained the patient that given the complexity of his midline wound he is no longer a candidate for skilled nursing facility in my opinion.   LTAC Thursday Discussed with nurse need for good IV so can get back on IVF for Cr; repeat BMET in am   [name], MD, FACS General, Bariatric, & Minimally Invasive Surgery [facility],  A [facility]   LOS: 20 days    Camellia Tanda 04/29/2024

## 2024-04-29 NOTE — Telephone Encounter (Signed)
 Pt is currently inpt and then will be transferring to LTAC. No warfarin refill is needed at this time.  Refused refill.

## 2024-04-29 NOTE — Progress Notes (Signed)
 OT Cancellation Note  Patient Details Name: Alexander [name] MRN: [id number] DOB: 1961/05/17   Cancelled Treatment:    Reason Eval/Treat Not Completed: Other (comment): Pt adamantly refused any participation with OT and raised voice with agitation when offered services.  When OT explained need to sign off due to constant refused since Evaluation on 04/19/24, pt reported, I'm leaving tomorrow.   Pt then closed eyes and would not regard OT who did close loop explaining that physicians can write new order for OT if pt begins to feel up to it. Pt did give one small nod to indicate understanding.   OT signing off.   [name] 04/29/2024, 11:28 AM

## 2024-04-29 NOTE — Progress Notes (Signed)
 Patient c/o pain to PIV site, IV fluid infusing without difficulty, attempted to flush line, patient screamed that it was too painful, not to flush it, IV consult placed to assess IV site, IV team also paged and spoke with IV RN, she stated that she will be up soon to assess IV site.
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
## 2024-04-30 ENCOUNTER — Institutional Professional Consult (permissible substitution)
Admission: AD | Admit: 2024-04-30 | Discharge: 2024-05-13 | Disposition: A | Discharge status: Skilled Nursing Facility | Payer: Self-pay | Source: Other Acute Inpatient Hospital | Attending: Internal Medicine | Admitting: Internal Medicine

## 2024-04-30 LAB — BASIC METABOLIC PANEL WITH GFR
Anion gap: 10 (ref 5–15)
BUN: 18 mg/dL (ref 8–23)
CO2: 16 mmol/L — ABNORMAL LOW (ref 22–32)
Calcium: 8.7 mg/dL — ABNORMAL LOW (ref 8.9–10.3)
Chloride: 107 mmol/L (ref 98–111)
Creatinine, Ser: 1.04 mg/dL (ref 0.61–1.24)
GFR, Estimated: >60 mL/min (ref >60)
Glucose, Bld: 97 mg/dL (ref 70–99)
Potassium: 3.7 mmol/L (ref 3.5–5.1)
Sodium: 133 mmol/L — ABNORMAL LOW (ref 135–145)

## 2024-04-30 LAB — CBC
HCT: 24.3 % — ABNORMAL LOW (ref 39.0–52.0)
Hemoglobin: 7.3 g/dL — ABNORMAL LOW (ref 13.0–17.0)
MCH: 29.2 pg (ref 26.0–34.0)
MCHC: 30 g/dL (ref 30.0–36.0)
MCV: 97.2 fL (ref 80.0–100.0)
Platelets: 795 K/uL — ABNORMAL HIGH (ref 150–400)
RBC: 2.5 MIL/uL — ABNORMAL LOW (ref 4.22–5.81)
RDW: 16.4 % — ABNORMAL HIGH (ref 11.5–15.5)
WBC: 6.7 K/uL (ref 4.0–10.5)
nRBC: 0 % (ref 0.0–0.2)

## 2024-04-30 LAB — PROTIME-INR
INR: 3.2 — ABNORMAL HIGH (ref 0.8–1.2)
Prothrombin Time: 34.3 s — ABNORMAL HIGH (ref 11.4–15.2)

## 2024-04-30 MED ORDER — ASCORBIC ACID 500 MG PO TABS: 500.0000 mg | ORAL_TABLET | Freq: Two times a day (BID) | ORAL | Status: DC

## 2024-04-30 MED ORDER — PIPERACILLIN-TAZOBACTAM 3.375 G IVPB: 3.3750 g | Freq: Three times a day (TID) | INTRAVENOUS | Status: AC

## 2024-04-30 MED ORDER — ACETAMINOPHEN 500 MG PO TABS: 1000.0000 mg | ORAL_TABLET | Freq: Three times a day (TID) | ORAL | 0 refills | Status: DC

## 2024-04-30 MED ORDER — LOPERAMIDE HCL 2 MG PO CAPS: 2.0000 mg | ORAL_CAPSULE | Freq: Every day | ORAL | 0 refills | Status: DC

## 2024-04-30 MED ORDER — METHOCARBAMOL 500 MG PO TABS: 500.0000 mg | ORAL_TABLET | Freq: Three times a day (TID) | ORAL | Status: DC

## 2024-04-30 MED ORDER — GABAPENTIN 100 MG PO CAPS: 200.0000 mg | ORAL_CAPSULE | Freq: Three times a day (TID) | ORAL | Status: DC

## 2024-04-30 MED ORDER — PANTOPRAZOLE SODIUM 40 MG PO TBEC: 40.0000 mg | DELAYED_RELEASE_TABLET | Freq: Every day | ORAL | Status: DC

## 2024-04-30 MED ORDER — ZINC SULFATE 220 (50 ZN) MG PO CAPS: 220.0000 mg | ORAL_CAPSULE | Freq: Every day | ORAL | Status: DC

## 2024-04-30 MED ORDER — HYDROMORPHONE HCL 1 MG/ML IJ SOLN: 0.5000 mg | INTRAMUSCULAR | Status: DC | PRN

## 2024-04-30 MED ORDER — WARFARIN SODIUM 2.5 MG PO TABS: ORAL_TABLET | ORAL | 0 refills | Status: DC

## 2024-04-30 MED ORDER — OXYCODONE HCL 5 MG PO TABS: 5.0000 mg | ORAL_TABLET | Freq: Four times a day (QID) | ORAL | Status: DC | PRN

## 2024-04-30 MED ORDER — PSYLLIUM 95 % PO PACK
1.0000 | PACK | Freq: Every day | ORAL | Status: DC
Start: 2024-04-30 — End: 2024-08-19

## 2024-04-30 MED ORDER — SODIUM CHLORIDE 0.9 % IV SOLN: 40.0000 mL | INTRAVENOUS | Status: DC

## 2024-04-30 MED ORDER — ADULT MULTIVITAMIN W/MINERALS CH: 1.0000 | ORAL_TABLET | Freq: Every day | ORAL | Status: DC

## 2024-04-30 NOTE — Discharge Instructions (Addendum)
 Coumadin :  For discharge, Pharmacy recommend warfarin 2.5 mg PO daily with INR recheck no later than Monday Aug 4th.   Ileostomy Care: Stoma type/location: RLQ loop ileostomy Stomal assessment/size: 32 mm round, red and viable, slight above the skin level. Peristomal assessment:  Treatment options for stomal/peristomal skin: Ring 2 Ostomy pouching: 1pc. #[id number], ring [id number]. Education provided: Pt is already tech about ostomy care. He insisted on maintaining his routine when I change his pouch. I talked to him, asking to trust on me, given confidence about ostomy care. He nod.   FOR SNF: [facility] item numbers  1pc soft convex   [phone] 2 barrier ring [id number]  Wound VAC: 1 large piece of Mepitel at base of wound; then white foam dressing, then large black foam dressing, apply at continuous  Ileostomy output: Right now patient is on once a day Metamucil and twice daily 2 mg of Imodium .  The Imodium  may need to be titrated based on ileostomy output.  If ostomy output goes very low I would stop/decrease Imodium .  Patient may need supplemental IV fluids based on high-volume ileostomy output.  If ileostomy output gets above 1200 mL/day would recommend maximizing Imodium  dosage  CCS      [facility], [facility] [phone]  OPEN ABDOMINAL SURGERY: POST OP INSTRUCTIONS  Always review your discharge instruction sheet given to you by the facility where your surgery was performed.  IF YOU HAVE DISABILITY OR FAMILY LEAVE FORMS, YOU MUST BRING THEM TO THE OFFICE FOR PROCESSING.  PLEASE DO NOT GIVE THEM TO YOUR DOCTOR.  A prescription for pain medication may be given to you upon discharge.  Take your pain medication as prescribed, if needed.  If narcotic pain medicine is not needed, then you may take acetaminophen  (Tylenol ) or ibuprofen  (Advil ) as needed. Take your usually prescribed medications unless otherwise directed. If you need a refill on your pain  medication, please contact your pharmacy. They will contact our office to request authorization.  Prescriptions will not be filled after 5pm or on week-ends. You should follow a light diet the first few days after arrival home, such as soup and crackers, pudding, etc.unless your doctor has advised otherwise. A high-fiber, low fat diet can be resumed as tolerated.   Be sure to include lots of fluids daily. Most patients will experience some swelling and bruising on the chest and neck area.  Ice packs will help.  Swelling and bruising can take several days to resolve .  ACTIVITIES:  .  Refrain from any heavy lifting or straining until approved by your doctor. You may drive when you no longer are taking prescription pain medication, you can comfortably wear a seatbelt, and you can safely maneuver your car and apply brakes Return to Work: ___________________________________ Rosine should see your doctor in the office for a follow-up appointment approximately two weeks after your surgery.  Make sure that you call for this appointment within a day or two after you arrive home to insure a convenient appointment time. OTHER INSTRUCTIONS:  _____________________________________________________________ _____________________________________________________________  WHEN TO CALL YOUR DOCTOR: Fever over 101.0 Inability to urinate Nausea and/or vomiting Extreme swelling or bruising Continued bleeding from incision. Increased pain, redness, or drainage from the incision. Difficulty swallowing or breathing Muscle cramping or spasms. Numbness or tingling in hands or feet or around lips.  The clinic staff is available to answer your questions during regular business hours.  Please don't hesitate to call and ask to speak to one of  the nurses if you have concerns.  For further questions, please visit [email or web address]

## 2024-04-30 NOTE — Progress Notes (Signed)
 Report called to [facility] at [facility] [phone]. She denied questions or concerns at this time.

## 2024-04-30 NOTE — TOC Transition Note (Signed)
 Transition of Care [facility]) - Discharge Note   Patient Details  Name: Alexander [name] MRN: [id number] Date of Birth: 03-18-1961  [facility] of [facility] CM/SW Contact:  Alfonse JONELLE Rex, RN Phone Number: 04/30/2024, 12:55 PM   Clinical Narrative:   [facility] for [facility]. No further TOC needs identified at this time.      Final next level of care: Long Term Acute Care (LTAC) Barriers to Discharge: Barriers Resolved   Patient Goals and CMS Choice Patient states their goals for this hospitalization and ongoing recovery are:: home          Discharge Placement                       Discharge Plan and Services Additional resources added to the After Visit Summary for     Discharge Planning Services: CM Consult                                 Social Drivers of Health (SDOH) Interventions SDOH Screenings   Food Insecurity: No Food Insecurity (04/10/2024)  Housing: High Risk (04/10/2024)  Transportation Needs: No Transportation Needs (04/10/2024)  Recent Concern: Transportation Needs - Unmet Transportation Needs (03/30/2024)  Utilities: Not At Risk (04/10/2024)  Depression (PHQ2-9): Low Risk  (07/24/2023)  Tobacco Use: Medium Risk (04/13/2024)     Readmission Risk Interventions    04/30/2024   12:54 PM 04/10/2024    1:55 PM 01/24/2024   10:43 AM  Readmission Risk Prevention Plan  Transportation Screening Complete Complete Complete  PCP or Specialist Appt within 3-5 Days   Complete  HRI or Home Care Consult   Complete  Social Work Consult for Recovery Care Planning/Counseling   Complete  Palliative Care Screening   Not Applicable  Medication Review Oceanographer) Complete Complete Complete  PCP or Specialist appointment within 3-5 days of discharge Complete Complete   HRI or Home Care Consult Complete Complete   SW Recovery Care/Counseling Consult Complete Complete   Palliative Care Screening Not Applicable Not Applicable    Skilled Nursing Facility Not Applicable Not Applicable

## 2024-04-30 NOTE — Progress Notes (Signed)
 Gave report to NP at [facility]  [name], MD, FACS General, Bariatric, & Minimally Invasive Surgery [facility],  [facility]

## 2024-04-30 NOTE — H&P (Signed)
 HISTORY AND PHYSICAL   PATIENT INFORMATION   NAME:  Alexander [name] DOB:  December 06, 1960 MRN:  [id number]  Date of face-to-face patient encounter:  05/01/24  Code status is FULL CODE.    CHIEF COMPLAINT  Dehiscence of laparotomy wound.   HISTORY OF PRESENT ILLNESS   63 years old male significant past medical history of pulmonary embolism, DVT, aortic atherosclerosis, PDA, diverticulosis, diverticulitis, gout, GERD, hypertension, pulmonary hypertension.  Patient latest hospitalization was from 04/09/2024 to 04/30/2024.  Patient was a direct admission by his surgeon for abdominal wall dehiscence as a result of prior surgeries.   Background:  Patient underwent an urgent right hemicolectomy, and drainage of intra-abdominal abscess on April 16, requiring re-exploration on April 17 with creation of end ileostomy and abdominal closure on April 19.  Patient was then discharged  and was followed outpatient by his surgeon.  His surgeon Dr. Tanda decided to readmit patient due to  developing dehiscence.   On July 14, patient underwent exploratory laparotomy, extensive lysis of adhesions, sigmoid colectomy and primary anastomosis of the sigmoid colon.  Creation of a Diverting loop ileostomy. Around postop day 5 patient has started having fever up to 102 and some tachycardia.  Unfortunately, on subsequent examination patient's medial wound, the fascia started to have some separation.   According to outside facility surgeon, patient declined any additional surgery unless it was an emergency such as evisceration.  It was decided to place a wound VAC over the surgical wound. Patient was started on antibiotics Zosyn  on 04/27/2024.   On 04/30/2024 patient was deemed stable enough to be discharged [facility] for strengthening, and wound care.  MEDICAL HISTORY: Pulmonary embolism, DVT, aortic atherosclerosis, PDA, diverticulosis, diverticulitis, gout, GERD, hypertension, pulmonary hypertension.    SURGICAL HISTORY Right hemicolectomy, drainage of intra-abdominal abscess.  Exploratory laparotomy.   SOCIAL HISTORY: Social History   Tobacco Use  . Smoking status: Former    Types: Cigarettes  . Smokeless tobacco: Former  Substance Use Topics  . Alcohol use: Never   Social History   Substance and Sexual Activity  Drug Use Never    FAMILY HISTORY: family history is not on file.  SCHEDULED MEDICATIONS: Scheduled/Continuous Medications     Scheduled         acetaminophen  (TYLENOL ) tablet 975 mg  975 mg, PO/Per Tube, Q8H SCH     ascorbic acid  (VITAMIN C ) tablet 500 mg  500 mg, PO/Per Tube, BID     gabapentin  (NEURONTIN ) capsule 200 mg  200 mg, PO/Per Tube, TID     loperamide  (IMODIUM ) capsule 2 mg  2 mg, PO, BID     methocarbamol  (ROBAXIN ) tablet 500 mg  500 mg, PO/Per Tube, TID     pantoprazole  (PROTONIX ) IV 40 mg  40 mg, IV, Q24H SCH     piperacillin -tazobactam (ZOSYN ) 3.375 g in sodium chloride  0.9% 100 mL IVPB-MBP  3.375 g, IV, Q6H SCH     traZODone  (DESYREL ) tablet 100 mg  100 mg, PO/Per Tube, Nightly     Warfarin Per Policy  No Dose/Rate, XX, Daily     warfarin tablet 3 mg  2.5 mg, PO/Per Tube, Daily     zinc  sulfate (ZINCATE) capsule 220 mg  220 mg, PO, Once a day               ALLERGIES: Allergies[1]   ROS Review of Systems  Constitutional: Positive for malaise/fatigue. Negative for decreased appetite and fever.  Cardiovascular:  Negative for chest pain and dyspnea on  exertion.  Respiratory:  Negative for cough.   Musculoskeletal:  Positive for muscle weakness.  Gastrointestinal:  Positive for abdominal pain. Negative for anorexia, constipation and diarrhea.  Neurological:  Positive for weakness.      Physical Exam Vitals reviewed. Nursing note reviewed: 97.2-68-20-119/71-99%. Constitutional:      Appearance: He is ill-appearing.  HENT:     Head: Normocephalic.     Nose: Nose normal.     Mouth/Throat:     Mouth:  Mucous membranes are moist.  Cardiovascular:     Rate and Rhythm: Normal rate.  Pulmonary:     Effort: Pulmonary effort is normal.     Breath sounds: Normal breath sounds. No stridor. No wheezing, rhonchi or rales.  Abdominal:     General: Bowel sounds are normal.     Palpations: Abdomen is soft.     Tenderness: There is abdominal tenderness. There is guarding.     Comments: Large anterior surgical wound covered with dressing clean dry intact.   Left ostomy in place.  Genitourinary:    Penis: Normal.   Musculoskeletal:     Cervical back: Normal range of motion.     Comments: Generalized weakness.  Skin:    General: Skin is warm.  Neurological:     General: No focal deficit present.     Mental Status: He is alert and oriented to person, place, and time.  Psychiatric:        Mood and Affect: Mood normal.       LAB DATA  Latest Reference Range & Units 04/30/24 05:16  Basic metabolic panel with GFR  Rpt !  Sodium 135 - 145 mmol/L 133 (L)  Potassium 3.5 - 5.1 mmol/L 3.7  Chloride 98 - 111 mmol/L 107  CO2 22 - 32 mmol/L 16 (L)  Glucose 70 - 99 mg/dL 97  BUN 8 - 23 mg/dL 18  Creatinine 9.38 - 8.75 mg/dL 8.95  Calcium  8.9 - 10.3 mg/dL 8.7 (L)  Anion gap 5 - 15  10  GFR, Estimated >60 mL/min >60  WBC 4.0 - 10.5 K/uL 6.7  RBC 4.22 - 5.81 MIL/uL 2.50 (L)  Hemoglobin 13.0 - 17.0 g/dL 7.3 (L)  HCT 60.9 - 47.9 % 24.3 (L)  MCV 80.0 - 100.0 fL 97.2  MCH 26.0 - 34.0 pg 29.2  MCHC 30.0 - 36.0 g/dL 69.9  RDW 88.4 - 84.4 % 16.4 (H)  Platelets 150 - 400 K/uL 795 (H)  nRBC 0.0 - 0.2 % 0.0  Prothrombin  Time 11.4 - 15.2 seconds 34.3 (H)  INR 0.8 - 1.2  3.2 (H)  !: Data is abnormal (L): Data is abnormally low (H): Data is abnormally high Rpt: View report in Results Review for more information   IMPRESSION / PLAN   Dehiscence of laparotomy wound. Patient underwent exploratory laparotomy, extensive lysis of adhesions, sigmoid colectomy and primary anastomosis of the sigmoid colon and  creation of a Diverting loop ileostomy on 04/13/2024..  Subsequently developed dehiscence.  Wound VAC was placed at outside facility.  Consulted wound ostomy care.  Continue wound care.    History of PE and DVT.  Patient has been on Coumadin  at outside hospital.  Continue with Coumadin .  Pharmacy to follow and dose.  Maintain INR 2-3.  Latest INR 2.3.  Concern for perioperative infection.  Patient developed fever up to 102 around day 5 post exploratory laparotomy.  Was started on Zosyn  at outside facility.  We will monitor fever curve, and WBC count.  Latest WBC  within normal limits 6.7.  Acute pain.  Pain currently managed with Tylenol , gabapentin , Robaxin , Dilaudid , and oxycodone .  Pain appears to be well controlled.  Anemia .  Patient latest hemoglobin 7.3.  Monitor for bleeding, monitor H&H closely.  Transfuse for hemoglobin less than 7.    Generalized weakness and debility.  Patient with recent extensive and repeated surgeries.  PT and OT evaluation and treatment ordered.    GI prophylaxis.  Continue pantoprazole  IV daily.    DVT prophylaxis.  Continue Coumadin .  Spent a total of 75 minutes on this encounter. This includes reviewing patient's extensive history, assessment and visit with the patient as well as documentation time. Time spent also includes IDT collaboration.  The information above is a summary, and not a full recitation of the interaction with the patient and involved care team.  Not everything can be documented.  Sometimes inadvertent omissions occur.    SIGNATURE   KALOMBO, CESAR N, NP 04/30/24 3:35 PM EDT       [1] Allergies Allergen Reactions  . No Known Drug Allergies

## 2024-04-30 NOTE — Progress Notes (Addendum)
 RN prepared to administer Zosyn  dose. Patient stated that the IV site was uncomfortable and request for IV removal. Upon further assessment pt stated that the IV did not hurt but wanted to have RN to disconnect IV for transport. Discussed need to continue IVF and IV antibiotics. Pt  refused to be reconnected. [facility] arrived for transport, pt has two belonging bags. Phone charger placed in bag per pt request.

## 2024-04-30 NOTE — Progress Notes (Signed)
 PT Cancellation Note  Patient Details Name: Marico Buckle MRN: [id number] DOB: [birth year]   Cancelled Treatment:    Reason Eval/Treat Not Completed: Other (comment) (Pt refused.   [facility] going to [facility].  I am not working with PT.)Nurse confirmed she is giving report to [facility].    Stephane JULIANNA Bevel 04/30/2024, 9:06 AM Ronni Osterberg M,PT Acute Rehab Services [phone]
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
## 2024-04-30 NOTE — Plan of Care (Signed)

## 2024-05-01 DIAGNOSIS — M6281 Muscle weakness (generalized): Secondary | ICD-10-CM | POA: Diagnosis not present

## 2024-05-01 DIAGNOSIS — D638 Anemia in other chronic diseases classified elsewhere: Secondary | ICD-10-CM | POA: Diagnosis not present

## 2024-05-01 LAB — PROTIME-INR
INR: 2.8 — ABNORMAL HIGH (ref 0.8–1.2)
Prothrombin Time: 30.6 s — ABNORMAL HIGH (ref 11.4–15.2)

## 2024-05-01 LAB — CULTURE, BLOOD (ROUTINE X 2)
Culture: NO GROWTH
Culture: NO GROWTH
Special Requests: ADEQUATE

## 2024-05-01 LAB — HEMOGLOBIN AND HEMATOCRIT, BLOOD
HCT: 24.5 % — ABNORMAL LOW (ref 39.0–52.0)
HCT: 25.8 % — ABNORMAL LOW (ref 39.0–52.0)
Hemoglobin: 7.7 g/dL — ABNORMAL LOW (ref 13.0–17.0)
Hemoglobin: 8.1 g/dL — ABNORMAL LOW (ref 13.0–17.0)

## 2024-05-02 DIAGNOSIS — D638 Anemia in other chronic diseases classified elsewhere: Secondary | ICD-10-CM | POA: Diagnosis not present

## 2024-05-02 DIAGNOSIS — M6281 Muscle weakness (generalized): Secondary | ICD-10-CM | POA: Diagnosis not present

## 2024-05-02 LAB — HEMOGLOBIN AND HEMATOCRIT, BLOOD
HCT: 25.8 % — ABNORMAL LOW (ref 39.0–52.0)
Hemoglobin: 8.2 g/dL — ABNORMAL LOW (ref 13.0–17.0)

## 2024-05-02 LAB — PROTIME-INR
INR: 2.7 — ABNORMAL HIGH (ref 0.8–1.2)
Prothrombin Time: 29.8 s — ABNORMAL HIGH (ref 11.4–15.2)

## 2024-05-02 NOTE — Progress Notes (Signed)
 Pharmacy Consult Pharmacokinetic Note for Warfarin/Anticoagulation  Garon Melander is a 63 y.o. male. Pharmacy has been consulted for warfarin dosing and monitoring.     Recent Hematologic Labs No results found for: INR, HGB, HCT, HEMATOCRIT, PLT, PLATECOUNT, PTT, APTT, HEPARIN , ANTIXA, HEPARINXALML, LABHEPA, ANTIXAHPUNFR, ANTIXAENOX, ANTIXALMWH    CrCl cannot be calculated (--).  VITALS: Wt Readings from Last 3 Encounters:  05/02/24 154 lb (69.9 kg)    Subjective Indication: (P) pulmonary embolism;deep vein thrombosis Warfarin - Target INR range: (P) 2 - 3 Clinician to Dose: (P) Pharmacist to evaluate daily Warfarin initiated: (P) as an outpatient prior to previous hospitalization Prior to admission warfarin regimen: (P) 7.5 mg daily  Assessment Most Recent INR: no new lab result Hemoglobin: (P) no significant change Renal Function: (P) no new lab result Lab notes: (P) INR not drawn today  Plan Assessment & Plan: (P) Continue warfarin 3 mg daily, will check INR daily.  Unclear why it was not drawn today.  Will speak with nursing to see if it can be drawn. Clinician to Dose: (P) Pharmacist to evaluate daily  Will continue to follow patient's clinical progress daily.  GAMBILL, CHARLES W., PharmD

## 2024-05-02 NOTE — Progress Notes (Signed)
 PATIENT INFORMATION   Patient Name: Alexander [name]  Date of Birth: [birth year]  MRN: [id number]   Date of Admission: 04/30/2024  2:10 PM Length of Stay: 2 Length of Stay: 2   Primary Care Physician: Alexander Buttner, PA  Attending Physician: Corean JONETTA Daring, MD    SUBJECTIVE   Chief Complaint:  Dehiscence of laparotomy wound.   Interval Summary: 64 years old male significant past medical history of pulmonary embolism, DVT, aortic atherosclerosis, PDA, diverticulosis, diverticulitis, gout, GERD, hypertension, pulmonary hypertension.  Patient latest hospitalization was from 04/09/2024 to 04/30/2024.  Patient was a direct admission by his surgeon for abdominal wall dehiscence as a result of prior surgeries.   Background:  Patient underwent an urgent right hemicolectomy, and drainage of intra-abdominal abscess on April 16, requiring re-exploration on April 17 with creation of end ileostomy and abdominal closure on April 19.  Patient was then discharged  and was followed outpatient by his surgeon.  His surgeon Dr. Tanda decided to readmit patient due to  developing dehiscence.   On July 14, patient underwent exploratory laparotomy, extensive lysis of adhesions, sigmoid colectomy and primary anastomosis of the sigmoid colon.  Creation of a Diverting loop ileostomy. Around postop day 5 patient has started having fever up to 102 and some tachycardia.  Unfortunately, on subsequent examination patient's medial wound, the fascia started to have some separation.   According to outside facility surgeon, patient declined any additional surgery unless it was an emergency such as evisceration.  It was decided to place a wound VAC over the surgical wound. Patient was started on antibiotics Zosyn  on 04/27/2024.   On 04/30/2024 patient was deemed stable enough to be discharged to [facility] for strengthening, and wound care.  Subjective:  Patient is complaining of abdominal pain and  insomnia.  OBJECTIVE   Objective  Vital Signs:  BP 102/62   Pulse 78   Temp 97 F (36.1 C) (Axillary)   Resp 15   Ht 5' 9 (1.753 m)   Wt 154 lb (69.9 kg)   SpO2 98%   BMI 22.74 kg/m     I/O last 24 Hours:  Intake/Output Summary (Last 24 hours) at 05/02/2024 0949 Last data filed at 05/02/2024 0800 Gross per 24 hour  Intake 2317 ml  Output 1850 ml  Net 467 ml     Physical Exam: Constitutional:      Appearance: He is ill-appearing.  HENT:     Head: Normocephalic.     Nose: Nose normal.     Mouth/Throat:     Mouth: Mucous membranes are moist.  Cardiovascular:     Rate and Rhythm: Normal rate.  Pulmonary:     Effort: Pulmonary effort is normal.     Breath sounds: Normal breath sounds. No stridor. No wheezing, rhonchi or rales.  Abdominal:     General: Bowel sounds are normal.     Palpations: Abdomen is soft.     Tenderness: There is abdominal tenderness. There is guarding.     Comments: Large anterior surgical wound covered with dressing clean, dry, and intact.   Left ostomy in place.  Genitourinary:    Penis: Normal.   Musculoskeletal:     Cervical back: Normal range of motion.     Comments: Generalized weakness.  Skin:    General: Skin is warm.  Neurological:     General: No focal deficit present.     Mental Status: He is alert and oriented to person, place, and  time.  Psychiatric:        Mood and Affect: Mood normal.      LABORATORY DATA:    Latest Reference Range & Units 05/02/24 04:18  Hemoglobin 13.0 - 17.0 g/dL 8.2 (L)  HCT 60.9 - 47.9 % 25.8 (L)  (L): Data is abnormally low  LAB DATA   Latest Reference Range & Units 04/30/24 05:16  Basic metabolic panel with GFR   Rpt !  Sodium 135 - 145 mmol/L 133 (L)  Potassium 3.5 - 5.1 mmol/L 3.7  Chloride 98 - 111 mmol/L 107  CO2 22 - 32 mmol/L 16 (L)  Glucose 70 - 99 mg/dL 97  BUN 8 - 23 mg/dL 18  Creatinine 9.38 - 8.75 mg/dL 8.95  Calcium  8.9 - 10.3 mg/dL 8.7 (L)  Anion gap 5 - 15  10  GFR,  Estimated >60 mL/min >60  WBC 4.0 - 10.5 K/uL 6.7  RBC 4.22 - 5.81 MIL/uL 2.50 (L)  Hemoglobin 13.0 - 17.0 g/dL 7.3 (L)  HCT 60.9 - 47.9 % 24.3 (L)  MCV 80.0 - 100.0 fL 97.2  MCH 26.0 - 34.0 pg 29.2  MCHC 30.0 - 36.0 g/dL 69.9  RDW 88.4 - 84.4 % 16.4 (H)  Platelets 150 - 400 K/uL 795 (H)  nRBC 0.0 - 0.2 % 0.0  Prothrombin  Time 11.4 - 15.2 seconds 34.3 (H)  INR 0.8 - 1.2  3.2 (H)  !: Data is abnormal (L): Data is abnormally low (H): Data is abnormally high     ASSESSMENT AND PLAN   Dehiscence of laparotomy wound. Patient underwent exploratory laparotomy, extensive lysis of adhesions, sigmoid colectomy and primary anastomosis of the sigmoid colon and creation of a Diverting loop ileostomy on 04/13/2024..  Subsequently developed dehiscence.  Wound VAC was placed at outside facility.  Wound VAC DC [facility].  Consulted wound/ostomy care.  Continue wound and ostomy care.     History of PE and DVT.  Patient has been on Coumadin  at outside hospital.  Continue with Coumadin .  Pharmacy to follow and dose.  Maintain INR 2-3.  Latest INR 2.8.   Concern for perioperative infection.  Patient developed fever up to 102 around day 5 post exploratory laparotomy.  Was started on Zosyn  at outside facility.  We will monitor fever curve, and WBC count.  Latest WBC within normal limits 6.7.   Acute pain.  Pain currently managed with Tylenol , gabapentin , Robaxin , Dilaudid , and oxycodone .  Pain state current pain medication regimen not effective.  Started OxyContin  ER 10 mg b.i.d., continue Dilaudid  1 mg p.r.n., gabapentin , and Robaxin .,.  Discontinue oxycodone  IR.    Normocytic anemia Anemia .  Patient latest hemoglobin 8.2.  Started ferrous sulfate .  Monitor for bleeding, monitor H&H closely.  Transfuse for hemoglobin less than 7.    Insomnia.  Added melatonin 3 mg q.h.s..  Continue trazodone  100 mg q.h.s..   Generalized weakness and debility.  Patient with recent extensive and  repeated surgeries.  PT and OT evaluation and treatment ordered.     GI prophylaxis.  Continue pantoprazole  IV daily.     DVT prophylaxis.  Continue Coumadin .   Code Status:  Full Resuscitation    Spent a total of 50 minutes on this encounter. This includes reviewing patient's extensive history, assessment and visit with the patient as well as documentation time. Time spent also includes IDT collaboration.    SIGNATURE   Electronically signed:  RAINA ROGUE SAILOR, NP  05/02/2024 at 9:49 AM EDT

## 2024-05-03 DIAGNOSIS — D638 Anemia in other chronic diseases classified elsewhere: Secondary | ICD-10-CM | POA: Diagnosis not present

## 2024-05-03 DIAGNOSIS — M6281 Muscle weakness (generalized): Secondary | ICD-10-CM | POA: Diagnosis not present

## 2024-05-03 LAB — PROTIME-INR
INR: 2.7 — ABNORMAL HIGH (ref 0.8–1.2)
Prothrombin Time: 30.3 s — ABNORMAL HIGH (ref 11.4–15.2)

## 2024-05-03 NOTE — Unmapped External Note (Signed)
 PT Treatment  Patient Name: Alexander [name] Patient Birthdate: 1961/05/14  Patient Subjective Report - Pt awake seated in recliner. Agreeable to PT at this time.    Pain Assessment Pain Context: Therapy Assessment Prior to Treatment (05/03/24 1653) Pain Assessment: NRS 0-10 (05/03/24 1653) Pain Score: 4 - Moderate Pain (05/03/24 1653) Pain Severity - NRS (Calculated): Moderate (05/03/24 1653)      Cognition:    Overall Cognitive Status:  Within Functional Limits Safety Awareness:    Safety Awareness:  Fair  Mobility:  Sit to Stand:    Sit to Stand Level of Assistance:  Touching/contact guard   Device:  RW   Sit to Stand Comments:  From recliner with UE assistance needed Stand to Sit:    Stand to Sit Level of Assistance:  Touching/contact guard   Device:  RW   Stand to Sit Comments:  To recliner with UE assist for controlled descent.      Surface:  Indoor and Tile Assistive Device:  RW Ambulation Level of Assistance:  Contact Guard Distance (feet):  35 Gait Analysis:  Pt with forward flexed posture, visually slow gait speed with decreased stride length and heavy UE reliance noted on walker.                 Summary:     Fall prevention and other recommendations:  Pt left in chair with feet up and all needs in reach. Mobility level remains posted in room.    Communicated Via:  Posted in room and Patient/family education PT Treatment Outcomes:    PT Treatment Outcomes:  Increasing strength, Improving overall functional endurance noted and Patient tolerated treatment well PT Summary:    PT Summary Comments:  Today's skilled session continued to address functional strengthening and activity tolerance. The pt was able to increase gait distance a little today with stable VS.  PT Summary Plan of Care:    PT Summary Plan of Care:  Continue with current plan of care         Pain Evaluation and Follow-up Pain Reassessment: 4 (05/03/24 1653) Nursing notified of  patient's pain assessment: Other (comment) (pt reporting his RN is aware and brining his paid meds when they are next due.) (05/03/24 1653)   Therapy Minutes      PTA Time In : 1643 PTA Time Out: 1653 PTA Total Time with Patient (Min): 10 min PTA Gait Training: 10 PTA Total Time to be Billed (Min): 10        BURY, KATHY, PTA 05/03/2024

## 2024-05-03 NOTE — Nursing Note (Signed)
 Patient sister brought meal in for patient and patient ate 100 percent of meal.

## 2024-05-04 DIAGNOSIS — D638 Anemia in other chronic diseases classified elsewhere: Secondary | ICD-10-CM | POA: Diagnosis not present

## 2024-05-04 DIAGNOSIS — M6281 Muscle weakness (generalized): Secondary | ICD-10-CM | POA: Diagnosis not present

## 2024-05-04 LAB — PROTIME-INR
INR: 2.5 — ABNORMAL HIGH (ref 0.8–1.2)
Prothrombin Time: 28.1 s — ABNORMAL HIGH (ref 11.4–15.2)

## 2024-05-05 DIAGNOSIS — D638 Anemia in other chronic diseases classified elsewhere: Secondary | ICD-10-CM | POA: Diagnosis not present

## 2024-05-05 DIAGNOSIS — M6281 Muscle weakness (generalized): Secondary | ICD-10-CM | POA: Diagnosis not present

## 2024-05-05 LAB — BASIC METABOLIC PANEL WITH GFR
Anion gap: 6 (ref 5–15)
BUN: 7 mg/dL — ABNORMAL LOW (ref 8–23)
CO2: 20 mmol/L — ABNORMAL LOW (ref 22–32)
Calcium: 8.9 mg/dL (ref 8.9–10.3)
Chloride: 112 mmol/L — ABNORMAL HIGH (ref 98–111)
Creatinine, Ser: 0.73 mg/dL (ref 0.61–1.24)
GFR, Estimated: >60 mL/min (ref >60)
Glucose, Bld: 118 mg/dL — ABNORMAL HIGH (ref 70–99)
Potassium: 3.7 mmol/L (ref 3.5–5.1)
Sodium: 138 mmol/L (ref 135–145)

## 2024-05-05 LAB — CBC
HCT: 23.6 % — ABNORMAL LOW (ref 39.0–52.0)
Hemoglobin: 7.3 g/dL — ABNORMAL LOW (ref 13.0–17.0)
MCH: 29.3 pg (ref 26.0–34.0)
MCHC: 30.9 g/dL (ref 30.0–36.0)
MCV: 94.8 fL (ref 80.0–100.0)
Platelets: 506 K/uL — ABNORMAL HIGH (ref 150–400)
RBC: 2.49 MIL/uL — ABNORMAL LOW (ref 4.22–5.81)
RDW: 17.1 % — ABNORMAL HIGH (ref 11.5–15.5)
WBC: 5.2 K/uL (ref 4.0–10.5)
nRBC: 0 % (ref 0.0–0.2)

## 2024-05-05 LAB — PROTIME-INR
INR: 2.3 — ABNORMAL HIGH (ref 0.8–1.2)
Prothrombin Time: 26 s — ABNORMAL HIGH (ref 11.4–15.2)

## 2024-05-06 DIAGNOSIS — D638 Anemia in other chronic diseases classified elsewhere: Secondary | ICD-10-CM | POA: Diagnosis not present

## 2024-05-06 DIAGNOSIS — M6281 Muscle weakness (generalized): Secondary | ICD-10-CM | POA: Diagnosis not present

## 2024-05-06 LAB — CBC
HCT: 23.1 % — ABNORMAL LOW (ref 39.0–52.0)
Hemoglobin: 7.2 g/dL — ABNORMAL LOW (ref 13.0–17.0)
MCH: 29.6 pg (ref 26.0–34.0)
MCHC: 31.2 g/dL (ref 30.0–36.0)
MCV: 95.1 fL (ref 80.0–100.0)
Platelets: 467 K/uL — ABNORMAL HIGH (ref 150–400)
RBC: 2.43 MIL/uL — ABNORMAL LOW (ref 4.22–5.81)
RDW: 17.3 % — ABNORMAL HIGH (ref 11.5–15.5)
WBC: 5.9 K/uL (ref 4.0–10.5)
nRBC: 0 % (ref 0.0–0.2)

## 2024-05-06 LAB — PROTIME-INR
INR: 2.5 — ABNORMAL HIGH (ref 0.8–1.2)
Prothrombin Time: 28 s — ABNORMAL HIGH (ref 11.4–15.2)

## 2024-05-06 NOTE — Unmapped External Note (Signed)
 Occupational Therapy      Treatment  Patient Name: Alexander [name] Patient Birthdate: 25-May-1961   Patient Subjective Report - Alexander [name] going to do my teeth and feet today    Pain Assessment Pain Context: Therapy Assessment Prior to Treatment (05/06/24 1320) Pain Assessment: NRS 0-10 (05/06/24 1320) Pain Score: 0 - No pain (05/06/24 1320) Pain Severity - NRS (Calculated): No pain (05/06/24 1320)       Cognition:    Overall Cognitive Status:  Within Functional Limits Safety Awareness:    Safety Awareness:  Good  ADLS:  Grooming:    Grooming Level of Assist:  Set-Up/Clean Up   Grooming Where Assessed:  Chair   Grooming Comments:  Oral care and face washing Lower Body Dressing:    Lower Body Dressing Level of Assist:  Dependent   Lower Body Dressing Where Assessed:  Chair   Lower Body Dressing Comments:  Doffing and donning hospital socks and shoes Lower Body Bathing:    Lower Body Bathing Level of Assist::  Moderate assistance   Lower Body Bathing Where Assessed:  Chair   Lower Body Bathing Comments:  Washing BLE from below knees and feet                                    Fall Prevention Recommendations:    Fall Prevention/ Other Recommendations:  Low bed, bed alarm, chair alarm   Communicated Via:  Verbal instruction with staff and Patient/family education Outcomes:    OT Summary Comments:  Pt found sitting in chair and in agreement to engage in therapy. Pt requires increased encouragement and verbal cues to initiate and engage in ADLs. Pt able to complete grooming with set up assistance and requires mod A for LB bathing and TD for doffing and donning shoes and socks. Pt has decreased motivation to participate in LB ADLs. Pt left sitting in chair with call bell.    OT Summary Plan of Care:  Continue with current plan of care        Pain Evaluation and Follow-up Pain Reassessment: 0, No pain (05/06/24 1320) Nursing notified of patient's pain  assessment: Not indicated - pain score 2 or less (05/06/24 1320)      Therapy Minutes   Enter Time Entry for Time Based Charges Here: Time In : 1320 Time Out: 1347 Total Time with Patient (Min): 27 min Self Care/ADLs: 27 Total Time to be Billed (Min): 27     BURZLER, MELISSA, OT 05/06/2024

## 2024-05-06 NOTE — Unmapped External Note (Signed)
 Patient refusing treatment at this time. Patient reports just being too tired today.

## 2024-05-06 NOTE — Unmapped External Note (Signed)
 Team Conference Note  Date: 05/06/2024   Time: 3:26 PM EDT    Patient Name:  Alexander [name]      Medical Record Number: [id number]  Date of Birth: 1961/06/27 Sex: Male         Room/Bed:  5E30/5E30-A Admit Date/Time:  04/30/2024  2:10 PM  Patient Active Problem List   Diagnosis   . Dehiscence of laparotomy wound       Team Members Present:  Nursing: Tora Ford Wounds:Shannon Dwiggins Respiratory: Garnette Louder  Pharmacy: Carlin Revel Dietician: Camie Parkinson  Rehab: Lynwood Glatter  Case Manager in Attendance: Suzen SAUNDERS. Wyatt  Anticipated Discharge 05/25/2024   Discharge Plan: Discharge Destination Details : Own Home Back-up Discharge Destination: Inpatient Rehab Facility  Barriers to Discharge: Capacity for self care, Need may exceed prior care facility capacity  Clinical Barriers: Clinical needs exceed caregiver capacity, Wound  Suicide Risk Assessment: No Concerns identified at this time  Pain Management:: Pain reported as adequately managed at this time.  Team Goals:  Case Management Goal 1: work with wound care nurse to get wounds healed Goal 1 - Progress: Progressing Goal 1 - Expected End Date: 05/15/24        Nursing (Wound and other) Goal 5: Wound(s) to decrease in size by 0.5% within 2 weeks Goal 5 - Progress: Progressing Goal 5 - Expected End Date: 05/22/24   Respiratory Therapy     Pharmacy Current Pharmacy Goals    Pain goal:  Optimize Pain Plan        Therapy   PT Weekly Progress Note     PT Weekly Progress Note by Arvella A. Bouplon, PTA at 05/06/2024  7:46 AM     Author: Arvella A. Bouplon, PTA Service: -- Author Type: Physical Therapy Assistant   Filed: 05/06/2024  7:51 AM Date of Service: 05/06/2024   Editor: Arvella A. Bouplon, PTA (Physical Therapy Assistant)            PT Weekly Progress Note  Patient Name: Alexander [name] Patient Birthdate: [birth year]  PT CURRENT FUNCTIONAL STATUS:  PT Current Functional Status:    PT Current  Functional Status:  Patient is 63 yo F who was evaluated for PT on 05/01/24. Patient progressing towards goals set by PT. Patient S to CGA of 1 with bed mobility and transfers and gaiit. Patient progressing gait with patient ambulating 60 feet with RW with S to CGA of 1. Continue to progress towards goals set by PT>  Factors in Goal Achievement: Barriers:  Strength limitations and Medical Complications     DME Recommendations Common Therapy DME:  (to be determined)  CARE Score Key: 6: Independent. Helper provides no assistance with tasks. A device may or may not have been used. 5: Set-up or clean-up assistance. Helper sets up or cleans up, but does not assist with tasks. Helper may have assisted prior to or following the activity. 4: Supervision or touching assistance. Helper provides verbal cues or touching/steadying or contact guard assistance. Assistance may be provided throughout the activity or intermittently. 3: Partial/moderate assistance. Helper does less than half the effort. Helper lifts, holds, or supports trunk or limbs, but provides less than half the effort. 2: Substantial/maximal assistance. Helper does more than half the effort. Helper lifts or holds trunk or limbs, and provides more than half the effort. 1: Dependent. Helper does all of the effort, or the assistance of two or more helpers is required for the patient to complete the activity. -: Inconsistent or incomplete documentation  Activity not attempted values: 7: Patient refused 9: Not applicable - Not attempted and the patient did not perform this activity prior to the current illness, exacerbation, or injury. 10: Not attempted due to environmental limitations (e.g., lack of equipment, weather constraints) 88: Not attempted due to medical condition or safety concerns  Goals: Goal Status on Admission Current Status  Sit to Lying LTG: Independent Sit to Lying - CARE Score: 3 (05/01/24 0942) Sit to Lying - CARE Score: 3  (05/06/24 0744)  Lying to Sitting on Side of Bed LTG: Independent Lying to Sitting on Side of Bed - CARE Score: 3 (05/01/24 0942) Lying to Sitting on Side of Bed - CARE Score: 3 (05/06/24 0744)  Sit to Stand LTG: Supervision or touching assistance Sit to Stand - CARE Score: 3 (05/01/24 0942) Sit to Stand - CARE Score: 3 (05/06/24 0744)  Chair/Bed-to-Chair Transfer LTG: Supervision or touching assistance Chair/Bed-to-Chair Transfer - CARE Score: 3 (05/01/24 0942) Chair/Bed-to-Chair Transfer - CARE Score: 3 (05/06/24 0744)  Walk 10 Feet LTG: Supervision or touching assistance Walk 10 Feet - CARE Score: 3 (05/01/24 0942) Walk 10 Feet - CARE Score: 3 (05/06/24 0744)    Walking 10 Feet on Uneven Surfaces - CARE Score: 10 (05/01/24 0942)    Walk 50 Feet with Two Turns LTG: Supervision or touching assistance Walk 50 Feet with Two Turns - CARE Score: 88 (05/01/24 0942)    Walk 150 Feet LTG: Supervision or touching assistance Walk 150 Feet - CARE Score: 88 (05/01/24 0942)    1 Step (Curb) LTG: Supervision or touching assistance 1 Step (Curb) - CARE Score: 88 (05/01/24 0942)    4 Steps LTG: Supervision or touching assistance 4 Steps - CARE Score: 88 (05/01/24 0942)    12 Steps LTG: Supervision or touching assistance 12 Steps - CARE Score: 88 (05/01/24 0942)      Wheel 50 Feet with Two Turns - CARE Score: 9 (05/01/24 0942)      Wheel 150 Feet - CARE Score: 9 (05/01/24 0942)     Other Goals: Goal Status on Admission Current Status  Goal Status - Patient will demonstrate sitting balance: Independent Status on Evaluation(Complete Only on Evaluation): Supervision (05/01/24 0942)                                                            BOUPLON, BRAD A., PTA 05/06/2024             OT Weekly Progress Note     OT Weekly Progress Note by Eleanor Linger, OT at 05/06/2024  8:55 AM     Author: Eleanor Linger, OT Service: Therapy Author Type: Occupational Therapist   Filed: 05/06/2024  9:05 AM Date  of Service: 05/06/2024   Editor: Eleanor Linger, OT (Occupational Therapist)            Occupational Therapy     Weekly Progress Note  Patient Name: Alexander [name] Patient Birthdate: 62/10/1960    OT Current Functional Status:  OT evaluation completed, treatment initiated. Treatment focus on: ADLs, activity tolerance, and standing balance.    Facilitating Factors in Goal Achievement:  Patient compliance       CARE Score Key: 6: Independent. Helper provides no assistance with tasks. A device may or may not have been used. 5: Set-up or clean-up  assistance. Helper sets up or cleans up, but does not assist with tasks. Helper may have assisted prior to or following the activity. 4: Supervision or touching assistance. Helper provides verbal cues or touching/steadying or contact guard assistance. Assistance may be provided throughout the activity or intermittently. 3: Partial/moderate assistance. Helper does less than half the effort. Helper lifts, holds, or supports trunk or limbs, but provides less than half the effort. 2: Substantial/maximal assistance. Helper does more than half the effort. Helper lifts or holds trunk or limbs, and provides more than half the effort. 1: Dependent. Helper does all of the effort, or the assistance of two or more helpers is required for the patient to complete the activity. -: Inconsistent or incomplete documentation  Activity not attempted values: 7: Patient refused 9: Not applicable - Not attempted and the patient did not perform this activity prior to the current illness, exacerbation, or injury. 10: Not attempted due to environmental limitations (e.g., lack of equipment, weather constraints) 88: Not attempted due to medical condition or safety concerns  Goals: Goal Status on Admission Current Status    Roll Left and Right - CARE Score: 3 (05/01/24 1055) Roll Left and Right - CARE Score: 6 (05/06/24 0850)    Eating - CARE Score: 6 (05/01/24 1055)  Eating - CARE Score: 6 (05/06/24 0850)  Oral Hygiene LTG: Independent Oral Hygiene - CARE Score: 4 (05/01/24 1055) Oral Hygiene - CARE Score: 5 (05/06/24 0850)  Toilet Transfer LTG: Supervision or touching assistance Toilet Transfer - CARE Score: 3 (05/01/24 1055)    Toileting Hygiene LTG: Supervision or touching assistance Toileting Hygiene - CARE Score: 1 (05/01/24 1055) Toileting Hygiene - CARE Score: 6 (05/06/24 0850)     Picking Up Object - CARE Score: 88 (05/01/24 1055) Picking Up Object - CARE Score: 88 (05/06/24 0850)     Car Transfer - CARE Score: 10 (05/01/24 1055) Car Transfer - CARE Score: 10 (05/06/24 0850)    Other Goals: Goal Status on Admission Current Status  Goal Status - Patient will demonstrate Grooming : Independent Status on Evaluation(Complete Only on Evaluation): mod A (05/01/24 1055) Current Status: Minimal assistance (min A for shaving) (05/06/24 0854)  Goal Status - Patient will complete Upper Body Dressing: Set-Up/clean-up Status on Evaluation(Complete Only on Evaluation): max A (05/01/24 1055) Current Status: Set-Up/clean-up (05/06/24 0854)  Goal Status - Patient will complete Upper Body Bathing: Set-Up/clean-up Status on Evaluation(Complete Only on Evaluation): TD (05/01/24 1055) Current Status: Minimal assistance (05/06/24 0854)  Goal Status - Patient will complete Lower Body Dressing: Supervision Status on Evaluation(Complete Only on Evaluation): TD (05/01/24 1055) Current Status: Substantial/maximal assistance (requires total assistance for donning and doffing shoes and socks, mod A for doffing and donning pants) (05/06/24 0854)  Goal Status - Patient will complete Lower Body Bathing: Supervision Status on Evaluation(Complete Only on Evaluation): TD (05/01/24 1055) Current Status: Moderate assistance (05/06/24 0854)         Goal Status - Patient will demonstrate____activity tolerance: Good Status on Evaluation(Complete Only on Evaluation): fair (05/01/24 1055)  Current Status: Good (05/06/24 0854)                       Goal Status - Patient will demonstrate Safety Awareness: Good Status on Evaluation(Complete Only on Evaluation): fair (05/01/24 1055) Current Status: Fair (05/06/24 0854)  Goal Status - Patient will not present with further deterioration of  current condition: Will not present with further deterioration of current condition Status on Evaluation(Complete Only on  Evaluation): will not present with further deterioration of current condition (05/01/24 1055) Current Status: Will not present with further deterioration of current condition (05/06/24 0854)    Additional Goals &Status: N/A      LILLA SETTER, OT 05/06/2024           SLP Weekly Progress Note  Notes from 04/29/24 through 05/06/24  No notes of this type exist for this encounter.      Nutrition Nutrition Goals (Active)     Problem: Malnutrition     Dates: Start:  05/01/24      Description: Inadequate intake of protein and/or energy sufficient to negatively impact growth/development, and/or result in loss of fat or muscle stores.  Related to: Interpretation of weight loss from baseline, Body fat loss, and Muscle mass loss As evidenced by: 19% loss of body weight     Goal: Adequate Meal Intake     Dates: Start:  05/01/24    Expected End:  05/08/24       Outcomes     Date/Time User Outcome   05/01/24 1004 Camie KYM Parkinson, RD [No Outcome Documented]      Flowsheet     Taken at 05/01/24 1002   Collaboration and Referral of Nutrition Care: Collaboration with Interdisciplinary Team by  Camie KYM Parkinson, RD   Meals and Snacks: General healthful diet by  Camie KYM Parkinson, RD   Vitamin and Mineral Supplement Therapy: Vitamin;Mineral by  Camie KYM Parkinson, RD Comment: Vit C, Zinc    Indicator/Monitor: PO intake, wt, labs, GI, skin by  Camie KYM Parkinson, RD          Medications: Current Medications[1]  Pharmacy: Latest Pharmacy IDT Documentation       Value Time User   Medications Reviewed:  Considerations for Discharge 05/05/2024  1:29 PM  Carlin MICAEL Revel, PharmD   Documented cosiderations for discharge:  Pain 05/05/2024  1:29 PM Carlin MICAEL Revel, PharmD   Current pain medications:  Medications Related to Management of Pain acetaminophen  (TYLENOL ) tablet 650 mg, 650 mg, Oral, Q6H PRN, Cesar N  Kalombo, NP, 650 mg at 04/30/24 1904 acetaminophen  (TYLENOL ) tablet 975 mg, 975 mg, PO/Per Tube, Q8H SCH, Cesar  N Kalombo, NP, 975 mg at 05/05/24 9385 HYDROmorphone  (DILAUDID ) injection 1 mg, 1 mg, Intravenous, Q4H PRN, Cesar  N Kalombo, NP, 1 mg at 05/05/24 1215 methocarbamol  (ROBAXIN ) tablet 500 mg, 500 mg, PO/Per Tube, 3 times per  day, Rogue LOISE Kiang, NP, 500 mg at 05/05/24 0614 [START ON 05/06/2024] morphine  (MS CONTIN ) 12 hr tablet 15 mg, 15 mg, Oral,  Q12H SCH, Cesar N Kalombo, NP oxyCODONE  (OxyCONTIN ) 12 hr extended release tablet 10 mg, 10 mg, Oral,  Q12H SCH, Cesar N Kalombo, NP, 10 mg at 05/05/24 1014   05/05/2024  1:29 PM Carlin MICAEL Revel, PharmD                                                Inpatient Morphine  Milligram Equivalents Per Day 7/31 - 8/7   Values displayed are in units of MME/Day    Order Start / End Date 7/31 8/1 8/2 8/3 8/4 Yesterday Today Tomorrow    Daily Totals  40 of Unknown (at least 75) 90 of 180 85 of 180 110 of 150 130 of 150 110 of 150 55 of 150 0 of 150  oxyCODONE  (ROXICODONE ) immediate release tablet Missing start date - 7/31 0 of Unknown -- -- -- -- -- -- --    oxyCODONE  (ROXICODONE ) immediate release tablet Missing start date - 7/31 0 of Unknown -- -- -- -- -- -- --    oxyCODONE  (ROXICODONE ) immediate release tablet 10 mg 7/31 - 8/2 30 of 45 60 of 90 15 of 30 -- -- -- -- --    HYDROmorphone  (DILAUDID ) injection 0.5 mg 7/31 - 8/1 10 of 30 10 of 30 -- -- -- -- -- --    HYDROmorphone  (DILAUDID ) injection 1 mg 8/1 - 8/14 -- 20 of 60 40 of 120 80 of 120 100 of 120 80 of 120 40 of 120  0 of 120    oxyCODONE  (OxyCONTIN ) 12 hr extended release tablet 10 mg 8/2 - 8/6 -- -- 30 of 30 30 of 30 30 of 30 30 of 30 15 of 15 --    morphine  (MS CONTIN ) 12 hr tablet 15 mg 8/6 - 8/20 -- -- -- -- -- -- 0 of 15 0 of 30    Calculation Errors     Order Type Date Details   oxyCODONE  (ROXICODONE ) immediate release tablet Ordered Dose -- Maximum number of scheduled times exceeded   oxyCODONE  (ROXICODONE ) immediate release tablet Ordered Dose -- Maximum number of scheduled times exceeded            Nursing Team Conference:  Bladder Continent: Continent Bladder Devices: Urinal Bladder Interventions: None Bladder: Describe level of assistance required and any barriers encountered in management: continent  Bowel Continent: Incontinent Bowel Devices: Ostomy Bowel Interventions: None Bowel: Describe level of assistance required and any barriers encountered in management: colostomy  Pain: Patient denies pain                        Nutrition Intake: Oral Nutrition Level of Assistance: Independent Nutrition Interventions: Clear throat after each sip/bite Nutrition: General appetite of patient over past 7 days, how diet tolerated, how responding to interventions, are boluses or supplements: good appetite  Safety Status: Good safety awareness;Follows instructions Safety Interventions: Education;Supervised activity;Performed rounding Safety: Describe response to interventions during past 7 days, include identified triggers for behavior episodes, and management plans: good safety awareness  Cognitive Orientation: Person;Date;Place;Time Cognitive Deficits Observed: None Cognitive: Describe interventions used during past 7 days and response: good cognition and comprehension  Quality of Sleep: Restful Approximate Hours Slept: 8 Sleep: Describe interventions in use and patient response during past 7 days: slept well  Medical Issues: increased ostomy output and dehiscenced lap  wound Pain Issues: denies pain Educational Topics: deep breathing exercises Patient-Family barriers to learning: None Is patient on dialysis?: No    Mobility: What is the patient's mobility level?: Level 4: OOB + Wt. Bearing + Ambul. + ROM Please identify the most relevant barrier to mobilization: No Barriers   Respiratory Team Conference: Patient is not currently receiving RT services                Nutrition Team Conference:    Diet Orders  (From admission, onward)           Start     Ordered   05/01/24 2000  Nutritional supplement Ensure Plus High Protein; One Can; Oral  2 times daily between meals       Comments: Strawberry  End/Expires: Until Specified    Question Answer Comment  Select Supplement: Ensure Plus High Protein   Volume: One Can  Administration Route: Oral      05/01/24 1010   05/01/24 1800  Nutritional supplement Juven; 1 unit pack; Oral  RD 2 times daily       End/Expires: Until Specified    Question Answer Comment  Select Supplement: Juven   Volume: 1 unit pack   Administration Route: Oral      05/01/24 1010   04/30/24 1534  Adult Diet Regular; 7 Regular (Regular Texture); 0 Thin (All Liquids)  Diet effective now       End/Expires: Until Specified    References:    IDDSI Website  Question Answer Comment  Diet Type: Regular   Diet Texture: 7 Regular (Regular Texture)   Liquid Consistency: 0 Thin (All Liquids)   Place order in third party system. Done      04/30/24 1533            Height: 5' 9 (175.3 cm)  Admit Weight: 168 lb 14.4 oz (76.6 kg)  Current Weight: 158 lb (71.7 kg)  Body mass index is 23.33 kg/m. Calorie Count:    Nutrition Intake: Oral Nutrition Risk: Patient at nutrition risk Nutrition Recommendations: Continue current nutrition care plan Nutrition Barriers to D/C: None Nutrition Update: Tolerating PO diet  Wound: Wound Rx: Other (Comment);Topical (comment) (skin prep, gauze, kerlix, vashe, abd  pads) Wound: Current Status: Improving, wound shows increase in granulation tissue       [1]  Current Facility-Administered Medications:  .  acetaminophen  (TYLENOL ) tablet 650 mg, 650 mg, Oral, Q6H PRN, Cesar N Kalombo, NP, 650 mg at 04/30/24 1904 .  acetaminophen  (TYLENOL ) tablet 975 mg, 975 mg, PO/Per Tube, Q8H SCH, Cesar N Kalombo, NP, 975 mg at 05/06/24 1515 .  ascorbic acid  (VITAMIN C ) tablet 500 mg, 500 mg, PO/Per Tube, 2 times per day, Rogue LOISE Kiang, NP, 500 mg at 05/06/24 0945 .  ferrous sulfate  325 mg (65 mg elemental iron ) EC tablet 1 tablet, 65 mg of elemental iron , Oral, Once a day, Corean JONETTA Daring, MD, 1 tablet at 05/06/24 0945 .  gabapentin  (NEURONTIN ) capsule 200 mg, 200 mg, PO/Per Tube, 3 times per day, Rogue LOISE Kiang, NP, 200 mg at 05/06/24 1517 .  HYDROmorphone  (DILAUDID ) injection 1 mg, 1 mg, Intravenous, Q4H PRN, Cesar N Kalombo, NP, 1 mg at 05/06/24 1144 .  loperamide  (IMODIUM ) capsule 2 mg, 2 mg, Oral, 2 times per day, Rogue LOISE Kiang, NP, 2 mg at 05/06/24 0945 .  melatonin tablet 6 mg, 6 mg, PO/Per Tube, Nightly, Rogue LOISE Kiang, NP, 6 mg at 05/05/24 2202 .  methocarbamol  (ROBAXIN ) tablet 500 mg, 500 mg, PO/Per Tube, 3 times per day, Rogue LOISE Kiang, NP, 500 mg at 05/06/24 1517 .  morphine  (MS CONTIN ) 12 hr tablet 15 mg, 15 mg, Oral, Q12H SCH, Cesar N Kalombo, NP .  ondansetron  (ZOFRAN ) injection 4 mg, 4 mg, Intravenous, Q6H PRN, Cesar N Kalombo, NP .  pantoprazole  (PROTONIX ) EC/DR tablet 40 mg, 40 mg, Oral, Once a day, Corean JONETTA Daring, MD, 40 mg at 05/06/24 0945 .  Tab-A-Vite/Beta Carotene tablet 1 tablet, 1 tablet, PO/Per Tube, Once a day, Sara C. Swindell, RD, 1 tablet at 05/06/24 0944 .  traZODone  (DESYREL ) tablet 100 mg, 100 mg, PO/Per Tube, Nightly, Rogue LOISE Kiang, NP, 100 mg at 05/05/24 2204 .  Warfarin Per Policy, , Does not apply, Daily, Cesar N Kalombo, NP .  warfarin tablet 3 mg, 3 mg, PO/Per Tube, Q48H, Corean JONETTA Daring, MD .  NOREEN ON 05/07/2024]  warfarin  tablet 5 mg, 5 mg, PO/Per Tube, Q48H, Corean JONETTA Daring, MD .  zinc  sulfate (ZINCATE) capsule 220 mg, 220 mg, Oral, Once a day, Cesar N Kalombo, NP, 220 mg at 05/06/24 0945

## 2024-05-07 DIAGNOSIS — D638 Anemia in other chronic diseases classified elsewhere: Secondary | ICD-10-CM | POA: Diagnosis not present

## 2024-05-07 DIAGNOSIS — M6281 Muscle weakness (generalized): Secondary | ICD-10-CM | POA: Diagnosis not present

## 2024-05-07 LAB — PROTIME-INR
INR: 2.4 — ABNORMAL HIGH (ref 0.8–1.2)
Prothrombin Time: 27.7 s — ABNORMAL HIGH (ref 11.4–15.2)

## 2024-05-07 NOTE — Unmapped External Note (Signed)
 PT Treatment  Patient Name: Alexander [name] Patient Birthdate: March 28, 1961  Patient Subjective Report -    Pain Assessment Pain Context: Therapy Assessment During Treatment (05/07/24 1100) Pain Assessment: NRS 0-10 (05/07/24 1100) Pain Score: 0 - No pain (05/07/24 1100) Pain Severity - NRS (Calculated): No pain (05/07/24 1100)      Cognition:    Overall Cognitive Status:  Within Functional Limits Safety Awareness:    Safety Awareness:  Fair  Mobility:  Bed Mobility:    Roll Left and Right:  Supervision   Lying to Sitting on Edge of Bed:  Supervision Sit to Stand:    Sit to Stand Level of Assistance:  Touching/contact guard   Device:  RW Stand to Sit:    Stand to Sit Level of Assistance:  Touching/contact guard   Device:  RW     Surface:  Smooth surface Assistive Device:  RW Ambulation Level of Assistance:  Close Tourist information centre manager (feet):  100 Gait Analysis:  Forward flexed at trunk with RW.                           Summary:  PT Summary:    PT Summary Comments:  Patient agreeable to PT. Supine to sit with CGA to SBA of 1. Patient sitting EOB x 10 mins with SBA of 1. Sit to stand with CGA to SBA of 1. Patient ambulated 170feet x 1 with RW with SBA to CGA of 1. Patient demonstrated slow steady gait with RW in hallway with SBA to CGA of 1. Patient returned to sitting in chair in room with call bell within reach. PT Summary Plan of Care:    PT Summary Plan of Care:  Continue with current plan of care         Pain Evaluation and Follow-up Response to Therapy Interventions: Improved activity tolerance, Improved cognitive focus (05/07/24 1100)   Therapy Minutes      PTA Time In : 1100 PTA Time Out: 1125 PTA Total Time with Patient (Min): 25 min PTA Gait Training: 25 PTA Total Time to be Billed (Min): 25        BOUPLON, BRAD A., PTA 05/07/2024

## 2024-05-07 NOTE — Progress Notes (Signed)
 Pharmacy Consult Pharmacokinetic Note for Warfarin/Anticoagulation  Alexander [name] is a 63 y.o. male. Pharmacy has been consulted for warfarin dosing and monitoring.     Recent Hematologic Labs No results found for: INR, HGB, HCT, HEMATOCRIT, PLT, PLATECOUNT, PTT, APTT, HEPARIN , ANTIXA, HEPARINXALML, LABHEPA, ANTIXAHPUNFR, ANTIXAENOX, ANTIXALMWH Recent Labs  Lab Units 05/05/24 0000  SERUM CREATININE  0.73   Estimated Creatinine Clearance: 104.9 mL/min (by C-G formula based on SCr of 0.73 mg/dL).  VITALS: Wt Readings from Last 3 Encounters:  05/07/24 158 lb (71.7 kg)    Subjective Indication: (P) pulmonary embolism;deep vein thrombosis Warfarin - Target INR range: (P) 2 - 3 Clinician to Dose: (P) Pharmacist to evaluate daily Warfarin initiated: (P) as an outpatient prior to previous hospitalization Prior to admission warfarin regimen: (P) 7.5 mg daily  Assessment Most Recent INR: (P) therapeutic Renal Function: (P) no new lab result Lab notes: (P) INR 2.4  Plan Assessment & Plan: (P) Continue current plan alternating 5mg  and 3mg .  If 4mg  tablets become available will transition to those. Clinician to Dose: (P) Pharmacist to evaluate daily  Will continue to follow patient's clinical progress daily.  GAMBILL, CHARLES W., PharmD

## 2024-05-07 NOTE — Unmapped External Note (Signed)
 Wound Progress Note  Reason for wound Consult: Weekly Wound Assessment  Patient is awake, alert, and oriented  Alexander [name] is a 63 y.o. male with the following Problems.  Problem List[1]  Past Medical History:  No past medical history on file.  Past Surgical History:  No past surgical history on file.  Allergies:  No known drug allergies  Braden Score:  Braden Scale Score: 20  Wound/Ulcer Assessment:  Wound Surgical Wound Quadrant, abdomen Midline/Middle/Center (Active)  Date First Assessed/Time First Assessed: 04/30/24 1507   Wound Type - REQUIRED: Surgical Wound  Pre-Existing Wound: Yes  Anatomical Site: Quadrant, abdomen  Wound Location Orientation: Midline/Middle/Center    Assessments 05/07/2024  9:25 AM  Wound Image    Wound Length (cm) 16.3 cm  Wound Width (cm) 10.2 cm  Wound Depth (cm) 5.1  Calculated Wound Size (cm^2) 166.26 cm^2  Calculated Wound Size (cm^3) 847.93 cm^3  Change in Wound Size %  16.85  Extent of Tissue Loss Full thickness tissue loss  Undermining None present  Granulation Tissue Bright, beefy red. 75 to 100 % of wound filled and/or tissue overgrowth  Epithelialization 0 to <25% wound covered  Necrotic Tissue Type Slough: white/gray non-viable and/or non-adherent yellow  Necrotic Tissue Amount <25% wound bed covered  Other Wound Bed Characteristics Adipose/Subcutaneous;Muscle  Wound Edges Well-defined, not attached to wound base  Periwound Skin Color Pink or normal for ethnic group  Periwound Skin Edema No swelling or edema  Periwound Skin Induration None present  Exudate Type Serosanguineous: thin watery, pale re/pink  Exudate Amount Small  Odor None  Wound Management Cleansed;Barrier Film;Gauze;Foam;Other (Comment) (vashe)  Dressing Changed Changed  Dressing Status Clean;Dry;Intact     Active Orders  Date Order Order Summary Order Comments Authorizing Provider  04/30/24 1511 Wound Management: Use Associated Wounds location; Wound care per  algorithm Routine, Once, On Fri 05/01/24 at 1000, For 1 occurrence Wound Surgical Wound Quadrant, abdomen Midline/Middle/Center  Initiate wound care protocols Wound Location: Use Associated Wounds location Wound Management: Wound care per algorithm Initiate wound care protocols Rogue LOISE Kiang, NP  04/30/24 1511 Wound Management: Use Associated Wounds location (mid abdomen); Wound care per specified algorithm/order; Kathrine; Normal Saline; Prep with skin prep and allow to dry; Other (lightly pack with kerlix moistened with Vashe); ABD; Tape; Dressing no longe... Routine, Daily, First occurrence on Thu 04/30/24 at 1512, Until Specified Wound Surgical Wound Quadrant, abdomen Midline/Middle/Center  Wound Location: Use Associated Wounds location / mid abdomen Wound Management: Wound care per specified algorithm/order Type: Wet Cleanse: Normal Saline Prep: Prep with skin prep and allow to dry Fill/Apply: Other / lightly pack with kerlix moistened with Vashe Cover: ABD Secure with: Tape Change/PRN: Dressing no longer intact (i.e. lifting, leaking, damaged), Dressing damp, moist or saturated - Rogue LOISE Kiang, NP     Ostomy Ileostomy RLQ (Active)  Date First Assessed/Time First Assessed: 05/01/24 2036   Ostomy Type: Ileostomy  Location: RLQ  Placed by:: Other (Comment)    Assessments 05/07/2024  9:25 AM  Ostomy Color Pink;Red  Ostomy Viability Moist  Ostomy Tone Firm  Ostomy Profile Moderately protruding 1-3cm  Ostomy Shape Round  Mucocutaneous Junction Intact  Peristomal Skin Intact  Treatment Bag change;Site care  Stomal Appliance 1 piece     No associated orders.    Recommendations: Improvement noted, surgeon/wound care physician debrided abdomen. Continue with current wound care and preventative POC.  Signature: JEANNETTE KIRSCH, RN Date: 05/07/2024 Time: 12:33 PM EDT       [1] Patient  Active Problem List Diagnosis  . Dehiscence of laparotomy wound

## 2024-05-07 NOTE — Unmapped External Note (Signed)
 Case Management Reassessment Note  CM Reassessment Note:   Contact with:  Patient  Name(s):  Tacari  Discharge Plan Status:  Tentative  Patient Discharge Goal:  SNF/SNU  Back-Up Discharge Goal:  SNF/SNU  Interdisciplinary Team Level of Care Review and Recommendations:  SNF/SNU  Acceptance of Discharge Plan:  Patient verbalizes acceptance  Benefits of and limitations of services discussed:  [email or web address] Provider quality of care and resource use measures list(s) provided, Geographic limitations of patient specific treatment and Generic Limitations of Insurance Benefits    Expected Discharge Date: 05/25/2024   Narrative  DCM spoke to the patient at bedside to discuss discharge placement. The patient is requesting discharge to [facility]. Referrals are going out. DCM explained the SNF would have to accept him as a patient and his insurance would have to approve him.DCM will continue to coordinate a safe discharge to the least restrictive environment.   WYATT, KIMBERLY R. 05/07/2024

## 2024-05-07 NOTE — Unmapped External Note (Signed)
  Problem: Knowledge Deficit Goal: Patient/family/caregiver demonstrates understanding of disease process, treatment plan, medications, and discharge instructions Outcome: Completed Flowsheets (Taken 05/07/2024 1232) Patient/family/caregiver demonstrates understanding of disease process, treatment plan, medications, and discharge instructions: . Provide teaching at a level of understanding . Provide teaching via preferred learning method(s)   Problem: Potential for Compromised Skin Integrity Goal: Skin integrity is maintained or improved Outcome: Completed Flowsheets (Taken 05/07/2024 1232) Skin Integrity is Maintained or Improved: SABRA Assess skin and skin risk for breakdown . Relieve pressure to bony prominences, avoid shearing . Turn Patient . Keep skin clean and dry Goal: Nutritional status is improving Outcome: Progressing Flowsheets (Taken 05/07/2024 1232) Nutritional Status is Improving: Collaborate with clinical nutritionist   Problem: Urinary Incontinence Goal: Perineal skin integrity is maintained or improved Outcome: Completed Flowsheets (Taken 05/07/2024 1232) Perineal skin integrity is maintained or improved: . Keep skin clean and dry . Apply skin protectant   Problem: Bowel Incontinence Goal: Perineal Skin Integrity is Maintained or Improved Outcome: Completed Flowsheets (Taken 05/07/2024 1232) Perineal Skin Integrity is Maintained or Improved: . Keep skin clean and dry . Apply skin protectant

## 2024-05-07 NOTE — Procedures (Signed)
 WOUND DEBRIDEMENT  Wound location: Abdomen.  Debridement indication: Necrosis  Pre-procedure verification/TIME OUT:  The proposed risks versus benefits of this procedure were discussed in detail with patient.  patient verbalizes understanding and elects to proceed with this procedure.   Relevant documentation was reviewed prior to procedure including signed consent form, medications, and history and physical. All necessary equipment for procedure is available at time of procedure yes. An audible time out was done at 0913AM by [facility], correctly identifying patients name, medical record number, correct side, correct site, and correct procedure to be performed with Registered Nurse members of the procedure team all in agreement.    Procedure:  Site marked before the procedure: No  Character of Wound Pre-Debridment: Improved  Type and Level of Debridement:   Excisional: Excisional Debridement: Defined as the surgical removal or cutting away of devitalized tissue, necrosis, or slough; includes definite cutting away of tissue.   Down to and including muscle  Instruments Used:  Curette  Estimated Blood Loss: none  Post Debridement Findings: Measurements (cm): 16.3cm X 10.2cm X 5.1cm  Percentage of Wound Debrided: 5%   Character of Wound Post Debridement: Improved   Bleeding Controlled with N/A  Dressings Applied: NPWT  Biologics: Not Applicable  Condition of the Wound: Improved.  Procedure Location: Performed: Bedside  CON BRUCKNER T, DO  05/07/24 9:36 AM EDT

## 2024-05-07 NOTE — Unmapped External Note (Signed)
 Problem: Infection  Goal: Absence of infection and prevention of transmission during hospitalization  Outcome: Progressing     Problem: Knowledge Deficit  Goal: Patient and/or family demonstrate readiness to learn  Outcome: Progressing  Goal: Patient and/or family verbalizes understanding of education, and/or performs desired skill  Outcome: Progressing     Problem: Discharge Planning  Goal: Discharge to home or other facility with appropriate resources  Outcome: Progressing  Goal: Care giver will develop a plan to decrease their burden and enhance comfort in role  Outcome: Progressing     Problem: Delirium  Goal: Prevent and Manage Delirium  Outcome: Progressing     Problem: Fall Safety: Universal Precautions  Goal: Free from fall injury  Outcome: Progressing  Goal: Toilet Magnet Status ([facility] Only)  Outcome: Progressing     Problem: Fall Prevention: Medication Bundle  Goal: Patient will be free from Fall Injury  Outcome: Progressing     Problem: Fall Prevention: Continence Bundle  Goal: Patient will be free from Fall Injury  Outcome: Progressing     Problem: Fall Prevention: Environment/Sensory Bundle  Goal: Patient will be free from Fall Injury  Outcome: Progressing     Problem: Fall Prevention Across Bundles  Goal: Patient will be free from Fall Injury  Outcome: Progressing     Problem: Knowledge Deficit  Goal: Patient/family/caregiver demonstrates understanding of disease process, treatment plan, medications, and discharge instructions  Outcome: Progressing     Problem: Potential for Compromised Skin Integrity  Goal: Skin integrity is maintained or improved  Outcome: Progressing  Goal: Nutritional status is improving  Outcome: Progressing     Problem: Urinary Incontinence  Goal: Perineal skin integrity is maintained or improved  Outcome: Progressing     Problem: Bowel Incontinence  Goal: Perineal Skin Integrity is Maintained or Improved  Outcome: Progressing     Problem: Pain  Goal: Patient's Pain/Discomfort is  Manageable  Outcome: Progressing

## 2024-05-08 DIAGNOSIS — M6281 Muscle weakness (generalized): Secondary | ICD-10-CM | POA: Diagnosis not present

## 2024-05-08 DIAGNOSIS — D638 Anemia in other chronic diseases classified elsewhere: Secondary | ICD-10-CM | POA: Diagnosis not present

## 2024-05-08 LAB — PROTIME-INR
INR: 2.5 — ABNORMAL HIGH (ref 0.8–1.2)
Prothrombin Time: 28.3 s — ABNORMAL HIGH (ref 11.4–15.2)

## 2024-05-08 NOTE — Progress Notes (Signed)
 Pharmacy Consult Pharmacokinetic Note for Warfarin/Anticoagulation  Alexander [name] is a 63 y.o. male. Pharmacy has been consulted for warfarin dosing and monitoring.     Recent Hematologic Labs No results found for: INR, HGB, HCT, HEMATOCRIT, PLT, PLATECOUNT, PTT, APTT, HEPARIN , ANTIXA, HEPARINXALML, LABHEPA, ANTIXAHPUNFR, ANTIXAENOX, ANTIXALMWH Recent Labs  Lab Units 05/05/24 0000  SERUM CREATININE  0.73   Estimated Creatinine Clearance: 104.9 mL/min (by C-G formula based on SCr of 0.73 mg/dL).  VITALS: Wt Readings from Last 3 Encounters:  05/08/24 156 lb (70.8 kg)    Subjective Indication: (P) pulmonary embolism;deep vein thrombosis Warfarin - Target INR range: (P) 2 - 3 Clinician to Dose: (P) Pharmacist to evaluate daily Warfarin initiated: (P) as an outpatient prior to previous hospitalization Prior to admission warfarin regimen: (P) 7.5 mg daily  Assessment Most Recent INR: (P) no new lab result Renal Function: (P) no new lab result  Plan Assessment & Plan: (P) Continue current plan alternating 5mg  and 3mg .  If 4mg  tablets become available will transition to those. Clinician to Dose: (P) Pharmacist to evaluate daily  Will continue to follow patient's clinical progress daily.  GAMBILL, CHARLES W., PharmD

## 2024-05-08 NOTE — Unmapped External Note (Signed)
  Problem: Infection Goal: Absence of infection and prevention of transmission during hospitalization Outcome: Progressing   Problem: Knowledge Deficit Goal: Patient and/or family demonstrate readiness to learn Outcome: Progressing Goal: Patient and/or family verbalizes understanding of education, and/or performs desired skill Outcome: Progressing   Problem: Discharge Planning Goal: Discharge to home or other facility with appropriate resources Outcome: Progressing Goal: Care giver will develop a plan to decrease their burden and enhance comfort in role Outcome: Progressing   Problem: Delirium Goal: Prevent and Manage Delirium Outcome: Progressing   Problem: Fall Safety: Universal Precautions Goal: Free from fall injury Outcome: Progressing Goal: Toilet Magnet Status ([facility] Only) Outcome: Progressing   Problem: Fall Prevention: Medication Bundle Goal: Patient will be free from Fall Injury Outcome: Progressing   Problem: Fall Prevention: Continence Bundle Goal: Patient will be free from Fall Injury Outcome: Progressing   Problem: Fall Prevention: Environment/Sensory Bundle Goal: Patient will be free from Fall Injury Outcome: Progressing   Problem: Fall Prevention Across Bundles Goal: Patient will be free from Fall Injury Outcome: Progressing   Problem: Potential for Compromised Skin Integrity Goal: Nutritional status is improving Outcome: Progressing   Problem: Pain Goal: Patient's Pain/Discomfort is Manageable Outcome: Progressing

## 2024-05-09 DIAGNOSIS — M6281 Muscle weakness (generalized): Secondary | ICD-10-CM | POA: Diagnosis not present

## 2024-05-09 DIAGNOSIS — D638 Anemia in other chronic diseases classified elsewhere: Secondary | ICD-10-CM | POA: Diagnosis not present

## 2024-05-09 LAB — PROTIME-INR
INR: 2.3 — ABNORMAL HIGH (ref 0.8–1.2)
Prothrombin Time: 26.1 s — ABNORMAL HIGH (ref 11.4–15.2)

## 2024-05-10 DIAGNOSIS — M6281 Muscle weakness (generalized): Secondary | ICD-10-CM | POA: Diagnosis not present

## 2024-05-10 DIAGNOSIS — D638 Anemia in other chronic diseases classified elsewhere: Secondary | ICD-10-CM | POA: Diagnosis not present

## 2024-05-10 LAB — PROTIME-INR
INR: 2.3 — ABNORMAL HIGH (ref 0.8–1.2)
Prothrombin Time: 26.4 s — ABNORMAL HIGH (ref 11.4–15.2)

## 2024-05-11 DIAGNOSIS — D638 Anemia in other chronic diseases classified elsewhere: Secondary | ICD-10-CM | POA: Diagnosis not present

## 2024-05-11 DIAGNOSIS — M6281 Muscle weakness (generalized): Secondary | ICD-10-CM | POA: Diagnosis not present

## 2024-05-11 LAB — COMPREHENSIVE METABOLIC PANEL WITH GFR
ALT: 15 U/L (ref 0–44)
AST: 15 U/L (ref 15–41)
Albumin: 2.3 g/dL — ABNORMAL LOW (ref 3.5–5.0)
Alkaline Phosphatase: 65 U/L (ref 38–126)
Anion gap: 6 (ref 5–15)
BUN: 9 mg/dL (ref 8–23)
CO2: 23 mmol/L (ref 22–32)
Calcium: 9.2 mg/dL (ref 8.9–10.3)
Chloride: 109 mmol/L (ref 98–111)
Creatinine, Ser: 0.74 mg/dL (ref 0.61–1.24)
GFR, Estimated: >60 mL/min (ref >60)
Glucose, Bld: 89 mg/dL (ref 70–99)
Potassium: 3.7 mmol/L (ref 3.5–5.1)
Sodium: 138 mmol/L (ref 135–145)
Total Bilirubin: 0.2 mg/dL (ref 0.0–1.2)
Total Protein: 6.7 g/dL (ref 6.5–8.1)

## 2024-05-11 LAB — CBC
HCT: 25.2 % — ABNORMAL LOW (ref 39.0–52.0)
Hemoglobin: 8 g/dL — ABNORMAL LOW (ref 13.0–17.0)
MCH: 30.3 pg (ref 26.0–34.0)
MCHC: 31.7 g/dL (ref 30.0–36.0)
MCV: 95.5 fL (ref 80.0–100.0)
Platelets: 325 K/uL (ref 150–400)
RBC: 2.64 MIL/uL — ABNORMAL LOW (ref 4.22–5.81)
RDW: 18.9 % — ABNORMAL HIGH (ref 11.5–15.5)
WBC: 4.9 K/uL (ref 4.0–10.5)
nRBC: 0 % (ref 0.0–0.2)

## 2024-05-11 LAB — PROTIME-INR
INR: 2.1 — ABNORMAL HIGH (ref 0.8–1.2)
Prothrombin Time: 24.3 s — ABNORMAL HIGH (ref 11.4–15.2)

## 2024-05-12 ENCOUNTER — Telehealth: Payer: Self-pay | Admitting: Podiatry

## 2024-05-12 DIAGNOSIS — M6281 Muscle weakness (generalized): Secondary | ICD-10-CM | POA: Diagnosis not present

## 2024-05-12 DIAGNOSIS — D638 Anemia in other chronic diseases classified elsewhere: Secondary | ICD-10-CM | POA: Diagnosis not present

## 2024-05-12 LAB — PROTIME-INR
INR: 1.8 — ABNORMAL HIGH (ref 0.8–1.2)
Prothrombin Time: 22.1 s — ABNORMAL HIGH (ref 11.4–15.2)

## 2024-05-12 NOTE — Telephone Encounter (Signed)
 pt lft mess on my vmail. I called him back and he said he got taken care of yesterday.....some papers needed to get faxed.

## 2024-05-12 NOTE — Discharge Summary (Signed)
 DISCHARGE SUMMARY   BRIEF OVERVIEW   Admitting Provider: Corean JONETTA Daring, MD Discharge Provider: Corean JONETTA Daring, MD Primary Care Physician at Discharge: Lucie Buttner, [name] [phone]   Admission Date: 04/30/2024     Discharge Date:    Primary Discharge Diagnosis  Dehiscence of laparotomy Abdominal wound.    DISCHARGE DISPOSITION   Final discharge disposition not confirmed  Code Status at Discharge: FULL  Discharging to: [facility]  Discharge Diet: Regular, Thin liquids.  Outpatient Follow-Up PCP Wound care.    DETAILS OF HOSPITAL STAY   History of Present Illness:   63 years old [place] American male with significant past medical history of pulmonary embolism, DVT, aortic atherosclerosis, PDA, diverticulosis, diverticulitis, gout, GERD, hypertension, pulmonary hypertension.  Patient latest hospitalization was from 04/09/2024 to 04/30/2024.  Patient was a direct admission by his surgeon for abdominal wall dehiscence as a result of prior surgeries.   Previous hospitalization:  Patient underwent an urgent right hemicolectomy, and drainage of intra-abdominal abscess on April 16, requiring re-exploration on April 17 with creation of end ileostomy and abdominal closure on April 19.  Patient was then discharged  and was followed outpatient by his surgeon.  At some point, his surgeon Dr. Tanda decided to readmit patient due to  developing dehiscence.   On July 14, patient underwent exploratory laparotomy, extensive lysis of adhesions, sigmoid colectomy and primary anastomosis of the sigmoid colon.  Creation of a Diverting loop ileostomy. Around postop day 5 patient has started having fever up to 102 and some tachycardia.  Unfortunately, on subsequent examination patient's medial wound, the fascia started to have some separation.   According to outside facility surgeon, patient declined any additional surgery unless it was an emergency such as evisceration.  It was decided to  place a wound VAC over the surgical wound. Patient was started on antibiotics Zosyn  on 04/27/2024.   On 04/30/2024 patient was deemed stable enough to be discharged to select [facility] for strengthening, and wound care.  Hospital Course:   Dehiscence of laparotomy wound. Patient underwent exploratory laparotomy, extensive lysis of adhesions, sigmoid colectomy and primary anastomosis of the sigmoid colon and creation of a Diverting loop ileostomy on 04/13/2024..  Subsequently developed dehiscence.  Had a Wound VAC placed at outside facility.  Wound VAC currently discontinued.  Patient was followed by wound/ostomy team.     History of PE and DVT.  Patient has continued with Coumadin  .  No bleeding noted the goal is to  maintain INR 2-3.    Resolved perioperative infection.  Patient developed fever up to 102 around day 5 post exploratory laparotomy.  Completed Zosyn  started @ OSH on 05/05/2024.  Latest WBC within normal limits.  Patient has been afebrile.   Acute pain.  Currently well managed with MS Contin  15 mg b.i.d., Dilaudid  tablets, gabapentin , and Robaxin ..    Vision concern.  Patient requesting to be seen by ophthalmologist.  Unfortunately, no Ophthalmology on staff at [facility].  Patient to follow up outpatient.    Normocytic anemia.   Started ferrous sulfate .       Insomnia.  Continue melatonin, and trazodone  100 mg q.h.s..   Generalized weakness and debility.  Patient will need further PT, OT, and strengthening.     Consults:  Surgeon  Physical Exam at Discharge  Discharge Condition: stable  Pulse: 80 Resp: 18 BP: (!) 100/6 Temp: 97.5 F (36.4 C) Weight: 158 lb (71.7 kg)   General: Patient seen and examined in no distress. HEENT:  Normocephalic atraumatic, PERRLA, EOMs intact, nares  patent, oropharynx is clear  Neck : Supple, no JVD noted.  Cardiovascular : S1-S2 WNL, no murmurs rubs or gallops Lungs: clear to auscultation  Abdomen: + BS X4 Quads,  soft, Tender, abdominal wound with dressing CDI, RLQ colostomy.  Extremities: no clubbing cyanosis or edema  Neuro:  awake, alert and oriented X4.     >31 minutes spent in discharge coordination.   MEDICATIONS. Scheduled Meds: Scheduled/Continuous Medications     Scheduled         acetaminophen  (TYLENOL ) tablet 975 mg  975 mg, PO/Per Tube, Q8H SCH     ascorbic acid  (VITAMIN C ) tablet 500 mg  500 mg, PO/Per Tube, BID     ferrous sulfate  325 mg (65 mg elemental iron ) EC tablet 1 tablet  65 mg of elemental iron , PO, Once a day     gabapentin  (NEURONTIN ) capsule 200 mg  200 mg, PO/Per Tube, TID     loperamide  (IMODIUM ) capsule 2 mg  2 mg, PO, BID     melatonin tablet 6 mg  6 mg, PO/Per Tube, Nightly     methocarbamol  (ROBAXIN ) tablet 500 mg  500 mg, PO/Per Tube, TID     morphine  (MS CONTIN ) 12 hr tablet 15 mg  15 mg, PO, Q12H SCH     pantoprazole  (PROTONIX ) EC/DR tablet 40 mg  40 mg, PO, Once a day     Tab-A-Vite/Beta Carotene tablet 1 tablet  1 tablet, PO/Per Tube, Once a day     traZODone  (DESYREL ) tablet 100 mg  100 mg, PO/Per Tube, Nightly        warfarin tablet 5 mg  5 mg, PO/Per Tube, Daily     zinc  sulfate (ZINCATE) capsule 220 mg  220 mg, PO, Once a day              PRN Meds:. .  acetaminophen  650 mg every 6 hours as needed .  HYDROmorphone .  2 mg every 4 hours as needed for pain. .  ondansetron  4 mg every 6 hours as needed.  SIGNATURE   KALOMBO, ROGUE SAILOR, NP

## 2024-05-13 DIAGNOSIS — Z79899 Other long term (current) drug therapy: Secondary | ICD-10-CM | POA: Diagnosis not present

## 2024-05-13 DIAGNOSIS — R9431 Abnormal electrocardiogram [ECG] [EKG]: Secondary | ICD-10-CM | POA: Diagnosis not present

## 2024-05-13 DIAGNOSIS — R159 Full incontinence of feces: Secondary | ICD-10-CM | POA: Diagnosis not present

## 2024-05-13 DIAGNOSIS — R1111 Vomiting without nausea: Secondary | ICD-10-CM | POA: Diagnosis not present

## 2024-05-13 DIAGNOSIS — Z86718 Personal history of other venous thrombosis and embolism: Secondary | ICD-10-CM | POA: Diagnosis not present

## 2024-05-13 DIAGNOSIS — R000 Tachycardia, unspecified: Secondary | ICD-10-CM | POA: Diagnosis not present

## 2024-05-13 DIAGNOSIS — Z433 Encounter for attention to colostomy: Secondary | ICD-10-CM | POA: Diagnosis not present

## 2024-05-13 DIAGNOSIS — Z7401 Bed confinement status: Secondary | ICD-10-CM | POA: Diagnosis not present

## 2024-05-13 DIAGNOSIS — R109 Unspecified abdominal pain: Secondary | ICD-10-CM | POA: Diagnosis not present

## 2024-05-13 DIAGNOSIS — I272 Pulmonary hypertension, unspecified: Secondary | ICD-10-CM | POA: Diagnosis present

## 2024-05-13 DIAGNOSIS — Z833 Family history of diabetes mellitus: Secondary | ICD-10-CM | POA: Diagnosis not present

## 2024-05-13 DIAGNOSIS — K9409 Other complications of colostomy: Secondary | ICD-10-CM | POA: Diagnosis not present

## 2024-05-13 DIAGNOSIS — Z1152 Encounter for screening for COVID-19: Secondary | ICD-10-CM | POA: Diagnosis not present

## 2024-05-13 DIAGNOSIS — I700 Atherosclerosis of aorta: Secondary | ICD-10-CM | POA: Diagnosis not present

## 2024-05-13 DIAGNOSIS — R531 Weakness: Secondary | ICD-10-CM | POA: Diagnosis not present

## 2024-05-13 DIAGNOSIS — Z7901 Long term (current) use of anticoagulants: Secondary | ICD-10-CM | POA: Diagnosis not present

## 2024-05-13 DIAGNOSIS — F4323 Adjustment disorder with mixed anxiety and depressed mood: Secondary | ICD-10-CM | POA: Diagnosis not present

## 2024-05-13 DIAGNOSIS — R2689 Other abnormalities of gait and mobility: Secondary | ICD-10-CM | POA: Diagnosis not present

## 2024-05-13 DIAGNOSIS — Z932 Ileostomy status: Secondary | ICD-10-CM | POA: Diagnosis not present

## 2024-05-13 DIAGNOSIS — I2510 Atherosclerotic heart disease of native coronary artery without angina pectoris: Secondary | ICD-10-CM | POA: Diagnosis present

## 2024-05-13 DIAGNOSIS — K5790 Diverticulosis of intestine, part unspecified, without perforation or abscess without bleeding: Secondary | ICD-10-CM | POA: Diagnosis not present

## 2024-05-13 DIAGNOSIS — L24B3 Irritant contact dermatitis related to fecal or urinary stoma or fistula: Secondary | ICD-10-CM | POA: Diagnosis not present

## 2024-05-13 DIAGNOSIS — T8131XD Disruption of external operation (surgical) wound, not elsewhere classified, subsequent encounter: Secondary | ICD-10-CM | POA: Diagnosis not present

## 2024-05-13 DIAGNOSIS — K219 Gastro-esophageal reflux disease without esophagitis: Secondary | ICD-10-CM | POA: Diagnosis not present

## 2024-05-13 DIAGNOSIS — R1084 Generalized abdominal pain: Secondary | ICD-10-CM | POA: Diagnosis not present

## 2024-05-13 DIAGNOSIS — Z432 Encounter for attention to ileostomy: Secondary | ICD-10-CM | POA: Diagnosis not present

## 2024-05-13 DIAGNOSIS — E876 Hypokalemia: Secondary | ICD-10-CM | POA: Diagnosis present

## 2024-05-13 DIAGNOSIS — M109 Gout, unspecified: Secondary | ICD-10-CM | POA: Diagnosis present

## 2024-05-13 DIAGNOSIS — Z743 Need for continuous supervision: Secondary | ICD-10-CM | POA: Diagnosis not present

## 2024-05-13 DIAGNOSIS — Z86711 Personal history of pulmonary embolism: Secondary | ICD-10-CM | POA: Diagnosis not present

## 2024-05-13 DIAGNOSIS — I739 Peripheral vascular disease, unspecified: Secondary | ICD-10-CM | POA: Diagnosis present

## 2024-05-13 DIAGNOSIS — A419 Sepsis, unspecified organism: Secondary | ICD-10-CM | POA: Diagnosis present

## 2024-05-13 DIAGNOSIS — R112 Nausea with vomiting, unspecified: Secondary | ICD-10-CM | POA: Diagnosis not present

## 2024-05-13 DIAGNOSIS — Z7982 Long term (current) use of aspirin: Secondary | ICD-10-CM | POA: Diagnosis not present

## 2024-05-13 DIAGNOSIS — Z8249 Family history of ischemic heart disease and other diseases of the circulatory system: Secondary | ICD-10-CM | POA: Diagnosis not present

## 2024-05-13 DIAGNOSIS — K631 Perforation of intestine (nontraumatic): Secondary | ICD-10-CM | POA: Diagnosis present

## 2024-05-13 DIAGNOSIS — I110 Hypertensive heart disease with heart failure: Secondary | ICD-10-CM | POA: Diagnosis present

## 2024-05-13 DIAGNOSIS — D638 Anemia in other chronic diseases classified elsewhere: Secondary | ICD-10-CM | POA: Diagnosis not present

## 2024-05-13 DIAGNOSIS — M6281 Muscle weakness (generalized): Secondary | ICD-10-CM | POA: Diagnosis not present

## 2024-05-13 DIAGNOSIS — R051 Acute cough: Secondary | ICD-10-CM | POA: Diagnosis not present

## 2024-05-13 DIAGNOSIS — D649 Anemia, unspecified: Secondary | ICD-10-CM | POA: Diagnosis present

## 2024-05-13 DIAGNOSIS — I5032 Chronic diastolic (congestive) heart failure: Secondary | ICD-10-CM | POA: Diagnosis present

## 2024-05-13 DIAGNOSIS — G4700 Insomnia, unspecified: Secondary | ICD-10-CM | POA: Diagnosis present

## 2024-05-13 DIAGNOSIS — Z933 Colostomy status: Secondary | ICD-10-CM | POA: Diagnosis present

## 2024-05-13 DIAGNOSIS — E43 Unspecified severe protein-calorie malnutrition: Secondary | ICD-10-CM | POA: Diagnosis present

## 2024-05-13 DIAGNOSIS — G8929 Other chronic pain: Secondary | ICD-10-CM | POA: Diagnosis present

## 2024-05-13 DIAGNOSIS — R10817 Generalized abdominal tenderness: Secondary | ICD-10-CM | POA: Diagnosis not present

## 2024-05-13 DIAGNOSIS — Z87891 Personal history of nicotine dependence: Secondary | ICD-10-CM | POA: Diagnosis not present

## 2024-05-13 DIAGNOSIS — R918 Other nonspecific abnormal finding of lung field: Secondary | ICD-10-CM | POA: Diagnosis not present

## 2024-05-13 DIAGNOSIS — K668 Other specified disorders of peritoneum: Secondary | ICD-10-CM | POA: Diagnosis not present

## 2024-05-13 DIAGNOSIS — K5730 Diverticulosis of large intestine without perforation or abscess without bleeding: Secondary | ICD-10-CM | POA: Diagnosis not present

## 2024-05-13 DIAGNOSIS — K529 Noninfective gastroenteritis and colitis, unspecified: Secondary | ICD-10-CM | POA: Diagnosis present

## 2024-05-13 DIAGNOSIS — N400 Enlarged prostate without lower urinary tract symptoms: Secondary | ICD-10-CM | POA: Diagnosis not present

## 2024-05-13 DIAGNOSIS — B9789 Other viral agents as the cause of diseases classified elsewhere: Secondary | ICD-10-CM | POA: Diagnosis present

## 2024-05-13 DIAGNOSIS — I10 Essential (primary) hypertension: Secondary | ICD-10-CM | POA: Diagnosis not present

## 2024-05-13 DIAGNOSIS — R198 Other specified symptoms and signs involving the digestive system and abdomen: Secondary | ICD-10-CM | POA: Diagnosis not present

## 2024-05-13 LAB — PROTIME-INR
INR: 1.9 — ABNORMAL HIGH (ref 0.8–1.2)
Prothrombin Time: 22.9 s — ABNORMAL HIGH (ref 11.4–15.2)

## 2024-05-15 DIAGNOSIS — T8131XD Disruption of external operation (surgical) wound, not elsewhere classified, subsequent encounter: Secondary | ICD-10-CM | POA: Diagnosis not present

## 2024-05-15 DIAGNOSIS — M6281 Muscle weakness (generalized): Secondary | ICD-10-CM | POA: Diagnosis not present

## 2024-05-15 DIAGNOSIS — Z86718 Personal history of other venous thrombosis and embolism: Secondary | ICD-10-CM | POA: Diagnosis not present

## 2024-05-18 DIAGNOSIS — R051 Acute cough: Secondary | ICD-10-CM | POA: Diagnosis not present

## 2024-05-18 DIAGNOSIS — T8131XD Disruption of external operation (surgical) wound, not elsewhere classified, subsequent encounter: Secondary | ICD-10-CM | POA: Diagnosis not present

## 2024-05-18 DIAGNOSIS — M6281 Muscle weakness (generalized): Secondary | ICD-10-CM | POA: Diagnosis not present

## 2024-05-21 DIAGNOSIS — T8131XD Disruption of external operation (surgical) wound, not elsewhere classified, subsequent encounter: Secondary | ICD-10-CM | POA: Diagnosis not present

## 2024-05-21 DIAGNOSIS — M6281 Muscle weakness (generalized): Secondary | ICD-10-CM | POA: Diagnosis not present

## 2024-05-24 ENCOUNTER — Other Ambulatory Visit: Payer: Self-pay

## 2024-05-24 ENCOUNTER — Encounter (HOSPITAL_COMMUNITY): Payer: Self-pay

## 2024-05-24 ENCOUNTER — Emergency Department (HOSPITAL_COMMUNITY)
Admission: EM | Admit: 2024-05-24 | Discharge: 2024-05-24 | Disposition: A | Discharge status: Home / Self Care | Attending: Emergency Medicine | Admitting: Emergency Medicine

## 2024-05-24 DIAGNOSIS — Z79899 Other long term (current) drug therapy: Secondary | ICD-10-CM | POA: Insufficient documentation

## 2024-05-24 DIAGNOSIS — Z933 Colostomy status: Secondary | ICD-10-CM | POA: Diagnosis not present

## 2024-05-24 DIAGNOSIS — K9409 Other complications of colostomy: Secondary | ICD-10-CM | POA: Diagnosis not present

## 2024-05-24 DIAGNOSIS — Z7901 Long term (current) use of anticoagulants: Secondary | ICD-10-CM | POA: Insufficient documentation

## 2024-05-24 DIAGNOSIS — Z433 Encounter for attention to colostomy: Secondary | ICD-10-CM

## 2024-05-24 MED ORDER — OXYCODONE-ACETAMINOPHEN 5-325 MG PO TABS
1.0000 | ORAL_TABLET | Freq: Once | ORAL | Status: AC
  Administered 2024-05-24: 1 via ORAL
  Filled 2024-05-24: qty 1

## 2024-05-24 NOTE — ED Notes (Signed)
 Changed patient's ostomy bag. Removed old bag that was leaking. Cleansed skin with no rinse cleaner. Applied protective ring and new bag. Sealed all edges with hypafix. Replaced soiled gauze on area of lower right abdomen with fresh gauze and covered with abd pad.

## 2024-05-24 NOTE — Discharge Instructions (Addendum)
 Colostomy bag was replaced in the ER.  You need to discuss colostomy care with the nursing home.  They are well equipped to manage this.  We request that the nursing home staff explain to you the process of managing colostomy.  You have a follow-up coming up with general surgery team.  If there are any specific supply related questions, please forward those questions to the surgeon.

## 2024-05-24 NOTE — ED Notes (Signed)
 Pt given pain med, turkey sandwich, graham crackers and ginger ale and warm blanket and sat in the recliner.  He was tired of being in the bed.

## 2024-05-24 NOTE — ED Notes (Signed)
 [facility] here to take pt. Paperwork complete

## 2024-05-24 NOTE — ED Notes (Signed)
 Called for transport for [facility] to take pt back to [facility].  Was told there are 5 transports in front of him.

## 2024-05-24 NOTE — ED Triage Notes (Signed)
 Pt arrives from [facility] for colostomy bag issues. Bag is currently in place, unsure if it is leaking. Facility reports patient keeps taking off the bags and they don't have any more. A&Ox4. vss

## 2024-05-24 NOTE — ED Provider Notes (Signed)
 [place] EMERGENCY DEPARTMENT AT [facility] Provider Note   CSN: [id number] Arrival date & time: 05/24/24  1056     Patient presents with: No chief complaint on file.   Alexander [name] is a 63 y.o. male.   HPI    63 year old patient comes in with chief complaint of colostomy issues. Patient has history of PE, DVT, diverticulosis and is status post partial colectomy.  He indicates that he is currently at a nursing home, and that the nursing home is unable to take care of his colostomy well.  The staff does not know what they are doing.  He constantly has some leakage issues.  His follow-up with the general surgeons is supposed to be next week.  Prior to Admission medications   Medication Sig Start Date End Date Taking? Authorizing Provider  acetaminophen  (TYLENOL ) 500 MG tablet Take 2 tablets (1,000 mg total) by mouth every 8 (eight) hours. 04/30/24   Tanda Locus, MD  ascorbic acid  (VITAMIN C ) 500 MG tablet Take 1 tablet (500 mg total) by mouth 2 (two) times daily. 04/30/24   Tanda Locus, MD  gabapentin  (NEURONTIN ) 100 MG capsule Take 2 capsules (200 mg total) by mouth 3 (three) times daily. 04/30/24   Tanda Locus, MD  HYDROmorphone  (DILAUDID ) 1 MG/ML injection Inject 0.5 mLs (0.5 mg total) into the vein every 4 (four) hours as needed (breakthru pain). 04/30/24   Tanda Locus, MD  loperamide  (IMODIUM ) 2 MG capsule Take 1 capsule (2 mg total) by mouth daily. 04/30/24   Tanda Locus, MD  methocarbamol  (ROBAXIN ) 500 MG tablet Take 1 tablet (500 mg total) by mouth 3 (three) times daily. 04/30/24   Tanda Locus, MD  Multiple Vitamin (MULTIVITAMIN WITH MINERALS) TABS tablet Take 1 tablet by mouth daily. 04/30/24   Tanda Locus, MD  ondansetron  (ZOFRAN ) 4 MG tablet Take 4 mg by mouth every 8 (eight) hours as needed for nausea or vomiting.    [provider]  oxyCODONE  (ROXICODONE ) 5 MG immediate release tablet Take 1 tablet (5 mg total) by mouth every 6 (six) hours as  needed for severe pain (pain score 7-10). 04/30/24   Tanda Locus, MD  pantoprazole  (PROTONIX ) 40 MG tablet Take 1 tablet (40 mg total) by mouth daily. 04/30/24   Tanda Locus, MD  psyllium (HYDROCIL/METAMUCIL) 95 % PACK Take 1 packet by mouth daily. 04/30/24   Tanda Locus, MD  sodium chloride  0.9 % infusion Inject 40 mLs into the vein continuous. 04/30/24   Tanda Locus, MD  traZODone  (DESYREL ) 100 MG tablet Take 1 tablet (100 mg total) by mouth at bedtime. 04/01/24   [name], PA  warfarin (COUMADIN ) 2.5 MG tablet TAKE 1 TABLET (2.5mg )  BY MOUTH DAILY OR AS DIRECTED BY ANTICOAGULATION CLINIC 04/30/24   Tanda Locus, MD  zinc  sulfate, 50mg  elemental zinc , 220 (50 Zn) MG capsule Take 1 capsule (220 mg total) by mouth daily. 04/30/24   Tanda Locus, MD    Allergies: Patient has no known allergies.    Review of Systems  All other systems reviewed and are negative.   Updated Vital Signs BP 109/72 (BP Location: Left Arm)   Pulse 88   Temp 98.5 F (36.9 C) (Oral)   Resp 16   SpO2 98%   Physical Exam Vitals and nursing note reviewed.  Constitutional:      Appearance: He is well-developed.  HENT:     Head: Atraumatic.  Cardiovascular:     Rate and Rhythm: Normal rate.  Pulmonary:  Effort: Pulmonary effort is normal.  Abdominal:     Comments: Patient has stoma over the middle of his abdomen, with ostomy bag in place.  The skin itself does not appear significantly irritated at this time.  There is some drainage type material noted surrounding the ostomy bag, with feculent odor.  Musculoskeletal:     Cervical back: Neck supple.  Skin:    General: Skin is warm.  Neurological:     Mental Status: He is alert and oriented to person, place, and time.     (all labs ordered are listed, but only abnormal results are displayed) Labs Reviewed - No data to display  EKG: None  Radiology: No results found.   Procedures   Medications Ordered in the ED - No data to display                                   Medical Decision Making  63 year old male with history of pulmonary embolism and colostomy comes in with chief complaint of colostomy issues. Patient also has chronic surgical wound dehiscence.  He is coming in today effectively requesting colostomy to be replaced.  He is charging that nursing home is not able to take care of his colostomy, that there is leakage.    Final diagnoses:  Colostomy care [facility])    ED Discharge Orders     None          Charlyn Sora, MD 05/24/24 1416

## 2024-05-24 NOTE — ED Notes (Signed)
Report called to Alona Bene, Charity fundraiser at [facility].

## 2024-05-25 ENCOUNTER — Encounter (HOSPITAL_COMMUNITY): Payer: Self-pay | Admitting: Nurse Practitioner

## 2024-05-25 DIAGNOSIS — Z86711 Personal history of pulmonary embolism: Secondary | ICD-10-CM | POA: Diagnosis not present

## 2024-05-25 DIAGNOSIS — M6281 Muscle weakness (generalized): Secondary | ICD-10-CM | POA: Diagnosis not present

## 2024-05-26 ENCOUNTER — Inpatient Hospital Stay (HOSPITAL_COMMUNITY)
Admission: RE | Admit: 2024-05-26 | Discharge: 2024-05-26 | Discharge status: Home / Self Care | Source: Ambulatory Visit | Attending: *Deleted | Admitting: *Deleted

## 2024-05-26 DIAGNOSIS — Z432 Encounter for attention to ileostomy: Secondary | ICD-10-CM

## 2024-05-26 DIAGNOSIS — I700 Atherosclerosis of aorta: Secondary | ICD-10-CM | POA: Diagnosis not present

## 2024-05-26 DIAGNOSIS — K5730 Diverticulosis of large intestine without perforation or abscess without bleeding: Secondary | ICD-10-CM | POA: Diagnosis not present

## 2024-05-26 DIAGNOSIS — R109 Unspecified abdominal pain: Secondary | ICD-10-CM | POA: Diagnosis not present

## 2024-05-26 DIAGNOSIS — R198 Other specified symptoms and signs involving the digestive system and abdomen: Secondary | ICD-10-CM | POA: Diagnosis not present

## 2024-05-26 DIAGNOSIS — F4323 Adjustment disorder with mixed anxiety and depressed mood: Secondary | ICD-10-CM

## 2024-05-26 DIAGNOSIS — L24B3 Irritant contact dermatitis related to fecal or urinary stoma or fistula: Secondary | ICD-10-CM | POA: Diagnosis not present

## 2024-05-26 DIAGNOSIS — K631 Perforation of intestine (nontraumatic): Secondary | ICD-10-CM | POA: Diagnosis not present

## 2024-05-26 DIAGNOSIS — A419 Sepsis, unspecified organism: Secondary | ICD-10-CM | POA: Diagnosis not present

## 2024-05-26 DIAGNOSIS — N400 Enlarged prostate without lower urinary tract symptoms: Secondary | ICD-10-CM | POA: Diagnosis not present

## 2024-05-26 DIAGNOSIS — Z932 Ileostomy status: Secondary | ICD-10-CM | POA: Diagnosis not present

## 2024-05-26 DIAGNOSIS — R918 Other nonspecific abnormal finding of lung field: Secondary | ICD-10-CM | POA: Diagnosis not present

## 2024-05-26 DIAGNOSIS — R000 Tachycardia, unspecified: Secondary | ICD-10-CM | POA: Diagnosis not present

## 2024-05-26 NOTE — Discharge Instructions (Signed)
 Coloplast 2 piece flexible POuch [id number]  NEW Barrier  [id number]  NEW Barrier ring [phone] Eakin seal  [id number]  NEW Stoma powder 7905 SKin prep  Barrier seals  ITEM # [id number] Belt [id number]  NEW
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
## 2024-05-27 ENCOUNTER — Other Ambulatory Visit: Payer: Self-pay

## 2024-05-27 ENCOUNTER — Emergency Department (HOSPITAL_COMMUNITY)

## 2024-05-27 ENCOUNTER — Encounter (HOSPITAL_COMMUNITY): Payer: Self-pay

## 2024-05-27 ENCOUNTER — Inpatient Hospital Stay (HOSPITAL_COMMUNITY)
Admission: EM | Admit: 2024-05-27 | Discharge: 2024-06-11 | DRG: 871 | Disposition: A | Discharge status: Skilled Nursing Facility | Source: Skilled Nursing Facility | Attending: Internal Medicine | Admitting: Internal Medicine

## 2024-05-27 ENCOUNTER — Telehealth: Payer: Self-pay

## 2024-05-27 ENCOUNTER — Other Ambulatory Visit (HOSPITAL_COMMUNITY): Payer: Self-pay | Admitting: Nurse Practitioner

## 2024-05-27 DIAGNOSIS — M6281 Muscle weakness (generalized): Secondary | ICD-10-CM | POA: Diagnosis not present

## 2024-05-27 DIAGNOSIS — R9431 Abnormal electrocardiogram [ECG] [EKG]: Secondary | ICD-10-CM | POA: Insufficient documentation

## 2024-05-27 DIAGNOSIS — E876 Hypokalemia: Secondary | ICD-10-CM | POA: Diagnosis present

## 2024-05-27 DIAGNOSIS — B9789 Other viral agents as the cause of diseases classified elsewhere: Secondary | ICD-10-CM | POA: Diagnosis present

## 2024-05-27 DIAGNOSIS — Z91119 Patient's noncompliance with dietary regimen due to unspecified reason: Secondary | ICD-10-CM

## 2024-05-27 DIAGNOSIS — K529 Noninfective gastroenteritis and colitis, unspecified: Secondary | ICD-10-CM | POA: Diagnosis present

## 2024-05-27 DIAGNOSIS — G8929 Other chronic pain: Secondary | ICD-10-CM | POA: Diagnosis present

## 2024-05-27 DIAGNOSIS — Z86718 Personal history of other venous thrombosis and embolism: Secondary | ICD-10-CM

## 2024-05-27 DIAGNOSIS — D649 Anemia, unspecified: Secondary | ICD-10-CM | POA: Diagnosis present

## 2024-05-27 DIAGNOSIS — I2510 Atherosclerotic heart disease of native coronary artery without angina pectoris: Secondary | ICD-10-CM | POA: Diagnosis present

## 2024-05-27 DIAGNOSIS — Z1152 Encounter for screening for COVID-19: Secondary | ICD-10-CM

## 2024-05-27 DIAGNOSIS — Z833 Family history of diabetes mellitus: Secondary | ICD-10-CM

## 2024-05-27 DIAGNOSIS — Z86711 Personal history of pulmonary embolism: Secondary | ICD-10-CM

## 2024-05-27 DIAGNOSIS — K668 Other specified disorders of peritoneum: Secondary | ICD-10-CM

## 2024-05-27 DIAGNOSIS — Z932 Ileostomy status: Secondary | ICD-10-CM

## 2024-05-27 DIAGNOSIS — A419 Sepsis, unspecified organism: Principal | ICD-10-CM | POA: Diagnosis present

## 2024-05-27 DIAGNOSIS — Z8674 Personal history of sudden cardiac arrest: Secondary | ICD-10-CM

## 2024-05-27 DIAGNOSIS — Z825 Family history of asthma and other chronic lower respiratory diseases: Secondary | ICD-10-CM

## 2024-05-27 DIAGNOSIS — L24B3 Irritant contact dermatitis related to fecal or urinary stoma or fistula: Secondary | ICD-10-CM

## 2024-05-27 DIAGNOSIS — M109 Gout, unspecified: Secondary | ICD-10-CM | POA: Diagnosis present

## 2024-05-27 DIAGNOSIS — I5032 Chronic diastolic (congestive) heart failure: Secondary | ICD-10-CM | POA: Diagnosis present

## 2024-05-27 DIAGNOSIS — K219 Gastro-esophageal reflux disease without esophagitis: Secondary | ICD-10-CM | POA: Diagnosis present

## 2024-05-27 DIAGNOSIS — I272 Pulmonary hypertension, unspecified: Secondary | ICD-10-CM | POA: Diagnosis present

## 2024-05-27 DIAGNOSIS — K631 Perforation of intestine (nontraumatic): Secondary | ICD-10-CM | POA: Diagnosis present

## 2024-05-27 DIAGNOSIS — Z432 Encounter for attention to ileostomy: Secondary | ICD-10-CM

## 2024-05-27 DIAGNOSIS — Z823 Family history of stroke: Secondary | ICD-10-CM

## 2024-05-27 DIAGNOSIS — R2689 Other abnormalities of gait and mobility: Secondary | ICD-10-CM | POA: Diagnosis not present

## 2024-05-27 DIAGNOSIS — Z6822 Body mass index (BMI) 22.0-22.9, adult: Secondary | ICD-10-CM

## 2024-05-27 DIAGNOSIS — R112 Nausea with vomiting, unspecified: Secondary | ICD-10-CM | POA: Insufficient documentation

## 2024-05-27 DIAGNOSIS — R198 Other specified symptoms and signs involving the digestive system and abdomen: Secondary | ICD-10-CM

## 2024-05-27 DIAGNOSIS — Z8249 Family history of ischemic heart disease and other diseases of the circulatory system: Secondary | ICD-10-CM

## 2024-05-27 DIAGNOSIS — Z7982 Long term (current) use of aspirin: Secondary | ICD-10-CM

## 2024-05-27 DIAGNOSIS — I739 Peripheral vascular disease, unspecified: Secondary | ICD-10-CM | POA: Diagnosis present

## 2024-05-27 DIAGNOSIS — E43 Unspecified severe protein-calorie malnutrition: Secondary | ICD-10-CM | POA: Diagnosis present

## 2024-05-27 DIAGNOSIS — I110 Hypertensive heart disease with heart failure: Secondary | ICD-10-CM | POA: Diagnosis present

## 2024-05-27 DIAGNOSIS — Z87891 Personal history of nicotine dependence: Secondary | ICD-10-CM

## 2024-05-27 DIAGNOSIS — Z7901 Long term (current) use of anticoagulants: Secondary | ICD-10-CM

## 2024-05-27 DIAGNOSIS — G4700 Insomnia, unspecified: Secondary | ICD-10-CM | POA: Diagnosis present

## 2024-05-27 LAB — COMPREHENSIVE METABOLIC PANEL WITH GFR
ALT: 17 U/L (ref 0–44)
AST: 19 U/L (ref 15–41)
Albumin: 3.1 g/dL — ABNORMAL LOW (ref 3.5–5.0)
Alkaline Phosphatase: 88 U/L (ref 38–126)
Anion gap: 11 (ref 5–15)
BUN: 12 mg/dL (ref 8–23)
CO2: 20 mmol/L — ABNORMAL LOW (ref 22–32)
Calcium: 9.6 mg/dL (ref 8.9–10.3)
Chloride: 105 mmol/L (ref 98–111)
Creatinine, Ser: 0.89 mg/dL (ref 0.61–1.24)
GFR, Estimated: >60 mL/min (ref >60)
Glucose, Bld: 120 mg/dL — ABNORMAL HIGH (ref 70–99)
Potassium: 4.1 mmol/L (ref 3.5–5.1)
Sodium: 136 mmol/L (ref 135–145)
Total Bilirubin: 0.8 mg/dL (ref 0.0–1.2)
Total Protein: 8.6 g/dL — ABNORMAL HIGH (ref 6.5–8.1)

## 2024-05-27 LAB — CBC WITH DIFFERENTIAL/PLATELET
Abs Immature Granulocytes: 0.2 K/uL — ABNORMAL HIGH (ref 0.00–0.07)
Basophils Absolute: 0.1 K/uL (ref 0.0–0.1)
Basophils Relative: 0 %
Eosinophils Absolute: 0 K/uL (ref 0.0–0.5)
Eosinophils Relative: 0 %
HCT: 35.3 % — ABNORMAL LOW (ref 39.0–52.0)
Hemoglobin: 11.1 g/dL — ABNORMAL LOW (ref 13.0–17.0)
Immature Granulocytes: 1 %
Lymphocytes Relative: 6 %
Lymphs Abs: 1.4 K/uL (ref 0.7–4.0)
MCH: 30.1 pg (ref 26.0–34.0)
MCHC: 31.4 g/dL (ref 30.0–36.0)
MCV: 95.7 fL (ref 80.0–100.0)
Monocytes Absolute: 1.4 K/uL — ABNORMAL HIGH (ref 0.1–1.0)
Monocytes Relative: 6 %
Neutro Abs: 20.3 K/uL — ABNORMAL HIGH (ref 1.7–7.7)
Neutrophils Relative %: 87 %
Platelets: 438 K/uL — ABNORMAL HIGH (ref 150–400)
RBC: 3.69 MIL/uL — ABNORMAL LOW (ref 4.22–5.81)
RDW: 16.7 % — ABNORMAL HIGH (ref 11.5–15.5)
WBC: 23.4 K/uL — ABNORMAL HIGH (ref 4.0–10.5)
nRBC: 0 % (ref 0.0–0.2)

## 2024-05-27 LAB — RESP PANEL BY RT-PCR (RSV, FLU A&B, COVID)  RVPGX2
Influenza A by PCR: NEGATIVE
Influenza B by PCR: NEGATIVE
Resp Syncytial Virus by PCR: NEGATIVE
SARS Coronavirus 2 by RT PCR: NEGATIVE

## 2024-05-27 LAB — I-STAT CG4 LACTIC ACID, ED
Lactic Acid, Venous: 1.3 mmol/L (ref 0.5–1.9)
Lactic Acid, Venous: 1.6 mmol/L (ref 0.5–1.9)

## 2024-05-27 LAB — PROTIME-INR
INR: 1.8 — ABNORMAL HIGH (ref 0.8–1.2)
Prothrombin Time: 22 s — ABNORMAL HIGH (ref 11.4–15.2)

## 2024-05-27 MED ORDER — MORPHINE SULFATE (PF) 4 MG/ML IV SOLN
4.0000 mg | Freq: Once | INTRAVENOUS | Status: AC
  Administered 2024-05-27: 4 mg via INTRAVENOUS
  Filled 2024-05-27: qty 1

## 2024-05-27 MED ORDER — IOHEXOL 350 MG/ML SOLN
75.0000 mL | Freq: Once | INTRAVENOUS | Status: AC | PRN
Start: 2024-05-27 — End: 2024-05-27
  Administered 2024-05-27: 75 mL via INTRAVENOUS

## 2024-05-27 MED ORDER — LACTATED RINGERS IV SOLN: INTRAVENOUS | Status: DC

## 2024-05-27 MED ORDER — SODIUM CHLORIDE 0.9 % IV SOLN
2.0000 g | Freq: Once | INTRAVENOUS | Status: AC
  Administered 2024-05-27: 2 g via INTRAVENOUS
  Filled 2024-05-27: qty 20

## 2024-05-27 MED ORDER — ACETAMINOPHEN 325 MG PO TABS
650.0000 mg | ORAL_TABLET | Freq: Once | ORAL | Status: AC
Start: 2024-05-27 — End: 2024-05-27
  Administered 2024-05-27: 650 mg via ORAL
  Filled 2024-05-27: qty 2

## 2024-05-27 MED ORDER — LACTATED RINGERS IV BOLUS (SEPSIS)
1000.0000 mL | Freq: Once | INTRAVENOUS | Status: AC
  Administered 2024-05-27: 1000 mL via INTRAVENOUS

## 2024-05-27 MED ORDER — LACTATED RINGERS IV BOLUS (SEPSIS)
250.0000 mL | Freq: Once | INTRAVENOUS | Status: AC
  Administered 2024-05-27: 250 mL via INTRAVENOUS

## 2024-05-27 MED ORDER — METRONIDAZOLE 500 MG/100ML IV SOLN
500.0000 mg | Freq: Once | INTRAVENOUS | Status: AC
  Administered 2024-05-27: 500 mg via INTRAVENOUS
  Filled 2024-05-27: qty 100

## 2024-05-27 MED ORDER — ONDANSETRON HCL 4 MG/2ML IJ SOLN
4.0000 mg | Freq: Once | INTRAMUSCULAR | Status: AC
Start: 2024-05-27 — End: 2024-05-27
  Administered 2024-05-27: 4 mg via INTRAVENOUS
  Filled 2024-05-27: qty 2

## 2024-05-27 MED ORDER — ONDANSETRON 4 MG PO TBDP
4.0000 mg | ORAL_TABLET | Freq: Once | ORAL | Status: AC
  Administered 2024-05-27: 4 mg via ORAL
  Filled 2024-05-27: qty 1

## 2024-05-27 MED ORDER — HYDROMORPHONE HCL 1 MG/ML IJ SOLN
1.0000 mg | Freq: Once | INTRAMUSCULAR | Status: AC
  Administered 2024-05-27: 1 mg via INTRAVENOUS
  Filled 2024-05-27: qty 1

## 2024-05-27 NOTE — ED Provider Notes (Signed)
  Physical Exam  BP 96/63   Pulse 95   Temp 97.9 F (36.6 C) (Oral)   Resp 11   Ht 5' 9 (1.753 m)   Wt 68 kg   SpO2 100%   BMI 22.15 kg/m   Physical Exam  Procedures  Procedures  ED Course / MDM   Clinical Course as of 05/27/24 2346  Wed May 27, 2024  2227 CTAP with PO contrast to evaluate for active extravasation. Reach out to Dr. Polly with general surgery if positive. [OZ]    Clinical Course User Index [OZ] Cecily Legrand LABOR, PA-C   Medical Decision Making Amount and/or Complexity of Data Reviewed Labs: ordered. Radiology: ordered.  Risk OTC drugs. Prescription drug management. Decision regarding hospitalization.   Patient care assumed at shift handoff from previous provider.  See his note for full details.  In short, 63 year old male with past medical history significant PAD, pulmonary hypertension, prior perforation of colon, ileostomy who presented to the emergency room complaining of abdominal pain nausea, vomiting, weakness.  Patient hitting sepsis criteria with tachycardia, white count of 23,000, source of infection with enteritis versus possible bowel perforation on CT scan.  Dr. Polly with general surgery initially plan to take the patient to the OR but due to patient's complicated history requested a repeat CT scan with oral contrast to evaluate for significant extravasation.  If findings are still significant plan to take patient to the OR.

## 2024-05-27 NOTE — Sepsis Progress Note (Signed)
 Elink monitoring for the code sepsis protocol.

## 2024-05-27 NOTE — ED Provider Notes (Signed)
 [facility] AT [facility] Provider Note   CSN: [id number] Arrival date & time: 05/27/24  1910     Patient presents with: Abdominal Pain, Nausea, Weakness, and Emesis   Alexander [name] is a 63 y.o. male.  Patient past history significant for peripheral artery disease, pulmonary hypertension, prior perforation of colon, ileostomy presents to the emergency department today with concerns of abdominal pain, nausea, vomiting, and weakness.  Patient reports that he began to experience multiple episodes of vomiting as well as generalized abdominal pain starting this morning.  Staff also noted patient to be more lethargic than normal at his facility.  No reported hematemesis and no report of bloody output from the ostomy site.  He denies any obvious fevers but no temperatures were measured prior to arriving.  =   Abdominal Pain Associated symptoms: vomiting   Weakness Associated symptoms: abdominal pain and vomiting   Emesis Associated symptoms: abdominal pain        Prior to Admission medications   Medication Sig Start Date End Date Taking? Authorizing Provider  acetaminophen  (TYLENOL ) 500 MG tablet Take 2 tablets (1,000 mg total) by mouth every 8 (eight) hours. 04/30/24   Tanda Locus, MD  ascorbic acid  (VITAMIN C ) 500 MG tablet Take 1 tablet (500 mg total) by mouth 2 (two) times daily. 04/30/24   Tanda Locus, MD  gabapentin  (NEURONTIN ) 100 MG capsule Take 2 capsules (200 mg total) by mouth 3 (three) times daily. 04/30/24   Tanda Locus, MD  HYDROmorphone  (DILAUDID ) 1 MG/ML injection Inject 0.5 mLs (0.5 mg total) into the vein every 4 (four) hours as needed (breakthru pain). 04/30/24   Tanda Locus, MD  loperamide  (IMODIUM ) 2 MG capsule Take 1 capsule (2 mg total) by mouth daily. 04/30/24   Tanda Locus, MD  methocarbamol  (ROBAXIN ) 500 MG tablet Take 1 tablet (500 mg total) by mouth 3 (three) times daily. 04/30/24   Tanda Locus, MD  Multiple Vitamin (MULTIVITAMIN  WITH MINERALS) TABS tablet Take 1 tablet by mouth daily. 04/30/24   Tanda Locus, MD  ondansetron  (ZOFRAN ) 4 MG tablet Take 4 mg by mouth every 8 (eight) hours as needed for nausea or vomiting.    [provider]  oxyCODONE  (ROXICODONE ) 5 MG immediate release tablet Take 1 tablet (5 mg total) by mouth every 6 (six) hours as needed for severe pain (pain score 7-10). 04/30/24   Tanda Locus, MD  pantoprazole  (PROTONIX ) 40 MG tablet Take 1 tablet (40 mg total) by mouth daily. 04/30/24   Tanda Locus, MD  psyllium (HYDROCIL/METAMUCIL) 95 % PACK Take 1 packet by mouth daily. 04/30/24   Tanda Locus, MD  sodium chloride  0.9 % infusion Inject 40 mLs into the vein continuous. 04/30/24   Tanda Locus, MD  traZODone  (DESYREL ) 100 MG tablet Take 1 tablet (100 mg total) by mouth at bedtime. 04/01/24   [name], PA  warfarin (COUMADIN ) 2.5 MG tablet TAKE 1 TABLET (2.5mg )  BY MOUTH DAILY OR AS DIRECTED BY ANTICOAGULATION CLINIC 04/30/24   Tanda Locus, MD  zinc  sulfate, 50mg  elemental zinc , 220 (50 Zn) MG capsule Take 1 capsule (220 mg total) by mouth daily. 04/30/24   Tanda Locus, MD    Allergies: Patient has no known allergies.    Review of Systems  Gastrointestinal:  Positive for abdominal pain and vomiting.  Neurological:  Positive for weakness.  All other systems reviewed and are negative.   Updated Vital Signs BP 96/63   Pulse 95   Temp 97.9 F (  36.6 C) (Oral)   Resp 11   Ht 5' 9 (1.753 m)   Wt 68 kg   SpO2 100%   BMI 22.15 kg/m   Physical Exam Vitals and nursing note reviewed.  Constitutional:      General: He is not in acute distress.    Appearance: He is well-developed. He is ill-appearing.  HENT:     Head: Normocephalic and atraumatic.  Eyes:     Conjunctiva/sclera: Conjunctivae normal.  Cardiovascular:     Rate and Rhythm: Normal rate and regular rhythm.     Heart sounds: No murmur heard. Pulmonary:     Effort: Pulmonary effort is normal. No respiratory distress.      Breath sounds: Normal breath sounds.  Abdominal:     Palpations: Abdomen is soft.     Tenderness: There is generalized abdominal tenderness.     Comments: Ostomy site with no active drainage and no significant redness of the surrounding skin.  Musculoskeletal:        General: No swelling.     Cervical back: Neck supple.  Skin:    General: Skin is warm and dry.     Capillary Refill: Capillary refill takes less than 2 seconds.  Neurological:     Mental Status: He is alert.  Psychiatric:        Mood and Affect: Mood normal.     (all labs ordered are listed, but only abnormal results are displayed) Labs Reviewed  COMPREHENSIVE METABOLIC PANEL WITH GFR - Abnormal; Notable for the following components:      Result Value   CO2 20 (*)    Glucose, Bld 120 (*)    Total Protein 8.6 (*)    Albumin  3.1 (*)    All other components within normal limits  CBC WITH DIFFERENTIAL/PLATELET - Abnormal; Notable for the following components:   WBC 23.4 (*)    RBC 3.69 (*)    Hemoglobin 11.1 (*)    HCT 35.3 (*)    RDW 16.7 (*)    Platelets 438 (*)    Neutro Abs 20.3 (*)    Monocytes Absolute 1.4 (*)    Abs Immature Granulocytes 0.20 (*)    All other components within normal limits  PROTIME-INR - Abnormal; Notable for the following components:   Prothrombin  Time 22.0 (*)    INR 1.8 (*)    All other components within normal limits  RESP PANEL BY RT-PCR (RSV, FLU A&B, COVID)  RVPGX2  CULTURE, BLOOD (ROUTINE X 2)  CULTURE, BLOOD (ROUTINE X 2)  URINALYSIS, W/ REFLEX TO CULTURE (INFECTION SUSPECTED)  I-STAT CG4 LACTIC ACID, ED  I-STAT CG4 LACTIC ACID, ED    EKG: EKG Interpretation Date/Time:  Wednesday May 27 2024 20:52:59 EDT Ventricular Rate:  121 PR Interval:  103 QRS Duration:  91 QT Interval:  439 QTC Calculation: 623 R Axis:   10  Text Interpretation: Sinus tachycardia Nonspecific T abnormalities, lateral leads new Prolonged QT interval Confirmed by Doretha Folks ([id number])  on 05/27/2024 9:35:20 PM  Radiology: CT ABDOMEN PELVIS W CONTRAST Result Date: 05/27/2024 CLINICAL DATA:  Abdominal pain. EXAM: CT ABDOMEN AND PELVIS WITH CONTRAST TECHNIQUE: Multidetector CT imaging of the abdomen and pelvis was performed using the standard protocol following bolus administration of intravenous contrast. RADIATION DOSE REDUCTION: This exam was performed according to the departmental dose-optimization program which includes automated exposure control, adjustment of the mA and/or kV according to patient size and/or use of iterative reconstruction technique. CONTRAST:  75mL OMNIPAQUE  IOHEXOL  350  MG/ML SOLN COMPARISON:  April 27, 2024 FINDINGS: Lower chest: Mild patchy right upper lobe and right middle lobe infiltrates are seen. Moderate severity areas of scarring, atelectasis and/or infiltrate are present within the bilateral lung bases. Hepatobiliary: No focal liver abnormality is seen. No gallstones, gallbladder wall thickening, or biliary dilatation. Pancreas: Unremarkable. No pancreatic ductal dilatation or surrounding inflammatory changes. Spleen: Normal in size without focal abnormality. Adrenals/Urinary Tract: Adrenal glands are unremarkable. Kidneys are normal, without renal calculi, focal lesion, or hydronephrosis. Bladder is unremarkable. Stomach/Bowel: There is mild asymmetric gastric wall thickening along the anterior aspect of the gastric antrum. Mild thickening of the duodenal bulb is also noted. These findings are seen on the prior study. A short segment of prominent (approximally 2.7 cm in diameter), air-filled proximal duodenum is seen just beyond the duodenal bulb (axial CT image 31, CT series 2/coronal reformatted image 58, CT series 5). An abrupt transition zone is seen within the region medial to the gallbladder. Several small foci of extraluminal air are also seen extending along the region adjacent to the gallbladder (axial CT images 32 through 35, CT series 2/coronal  reformatted images 44 through 55, CT series 5). Markedly inflamed bowel loops are seen within the right upper quadrant and posterolateral mid right abdomen. Marked severity surrounding mesenteric inflammatory fat stranding is seen. Surgically anastomosed bowel is again seen within the anteromedial aspect of the mid left abdomen and distal sigmoid colon. Numerous noninflamed diverticula are seen throughout the descending and sigmoid colon. Vascular/Lymphatic: Aortic atherosclerosis. No enlarged abdominal or pelvic lymph nodes. Reproductive: The prostate gland is mildly enlarged. Other: A right lower quadrant ostomy site is seen. No abdominopelvic ascites. Musculoskeletal: Degenerative changes are noted within the lower lumbar spine. IMPRESSION: 1. Marked severity enteritis involving a short segment of proximal duodenum and numerous small bowel loops within the right upper quadrant and posterolateral mid right abdomen. 2. Right upper quadrant free air consistent with associated bowel perforation. 3. Postoperative changes consistent with history of bowel resection. 4. Colonic diverticulosis. 5. Mild, patchy right upper lobe and right middle lobe infiltrates. 6. Moderate severity bibasilar scarring, atelectasis and/or infiltrate. 7. Aortic atherosclerosis. Electronically Signed   By: Suzen Dials M.D.   On: 05/27/2024 21:42   DG Chest Port 1 View Result Date: 05/27/2024 CLINICAL DATA:  Possible sepsis EXAM: PORTABLE CHEST 1 VIEW COMPARISON:  04/26/2024, 02/01/2024 FINDINGS: Hypoventilatory changes.probable scarring at the left base. Patchy atelectasis or minimal infiltrate at the right base. Stable cardiomediastinal silhouette with aortic atherosclerosis. No pleural effusion or pneumothorax. IMPRESSION: Hypoventilatory changes with patchy atelectasis or minimal infiltrate at the right base. Electronically Signed   By: Luke Bun M.D.   On: 05/27/2024 19:48     .Critical Care  Performed by: Annalysia Willenbring  A, PA-C Authorized by: Zoriana Oats A, PA-C   Critical care provider statement:    Critical care time (minutes):  65   Critical care start time:  05/27/2024 9:30 PM   Critical care end time:  05/27/2024 10:35 PM   Critical care time was exclusive of:  Separately billable procedures and treating other patients   Critical care was necessary to treat or prevent imminent or life-threatening deterioration of the following conditions:  Sepsis   Critical care was time spent personally by me on the following activities:  Development of treatment plan with patient or surrogate, discussions with consultants, examination of patient, ordering and performing treatments and interventions and re-evaluation of patient's condition   I assumed direction of critical care  for this patient from another provider in my specialty: no     Care discussed with: admitting provider      Medications Ordered in the ED  lactated ringers  infusion ( Intravenous New Bag/Given 05/27/24 2126)  lactated ringers  bolus 1,000 mL (0 mLs Intravenous Stopped 05/27/24 2229)    And  lactated ringers  bolus 1,000 mL (1,000 mLs Intravenous New Bag/Given 05/27/24 2238)    And  lactated ringers  bolus 250 mL (0 mLs Intravenous Stopped 05/27/24 2307)  cefTRIAXone  (ROCEPHIN ) 2 g in sodium chloride  0.9 % 100 mL IVPB (0 g Intravenous Stopped 05/27/24 2125)  metroNIDAZOLE  (FLAGYL ) IVPB 500 mg (0 mg Intravenous Stopped 05/27/24 2236)  acetaminophen  (TYLENOL ) tablet 650 mg (650 mg Oral Given 05/27/24 2037)  ondansetron  (ZOFRAN -ODT) disintegrating tablet 4 mg (4 mg Oral Given 05/27/24 2037)  morphine  (PF) 4 MG/ML injection 4 mg (4 mg Intravenous Given 05/27/24 2041)  ondansetron  (ZOFRAN ) injection 4 mg (4 mg Intravenous Given 05/27/24 2121)  iohexol  (OMNIPAQUE ) 350 MG/ML injection 75 mL (75 mLs Intravenous Contrast Given 05/27/24 2114)  HYDROmorphone  (DILAUDID ) injection 1 mg (1 mg Intravenous Given 05/27/24 2155)    Clinical Course as of 05/27/24 2318  Wed  May 27, 2024  2227 CTAP with PO contrast to evaluate for active extravasation. Reach out to Dr. Polly with general surgery if positive. [OZ]    Clinical Course User Index [OZ] Cecily Legrand LABOR, PA-C                                 Medical Decision Making Amount and/or Complexity of Data Reviewed Labs: ordered. Radiology: ordered.  Risk OTC drugs. Prescription drug management. Decision regarding hospitalization.   This patient presents to the ED for concern of abdominal pain, this involves an extensive number of treatment options, and is a complaint that carries with it a high risk of complications and morbidity.  The differential diagnosis includes bowel obstruction, diverticulitis, abdominal abscess, pyelonephritis, urolithiasis   Co morbidities that complicate the patient evaluation  Hypertension, history of PE, perforation of colon, ileostomy in place, malnutrition, pulmonary hypertension   Lab Tests:  I Ordered, and personally interpreted labs.  The pertinent results include: I-STAT lactic normal at 1.3, CBC shows significant leukocytosis at 23.4, PT/INR slightly elevated, CMP unremarkable, repeat lactic acid uptrending to 1.6, respiratory panel negative, UA pending, blood cultures pending   Imaging Studies ordered:  I ordered imaging studies including chest x-ray, CT abdomen pelvis I independently visualized and interpreted imaging which showed hypoventilatory changes with patchy atelectasis or minimal infiltrate at the right base. Marked severity enteritis involving a short segment of proximal duodenum and numerous small bowel loops within the right upper quadrant and posterolateral mid right abdomen. Right upper quadrant free air consistent with associated bowel perforation. Postoperative changes consistent with history of bowel resection. Colonic diverticulosis. Mild, patchy right upper lobe and right middle lobe infiltrates. Moderate severity bibasilar scarring,  atelectasis and/or infiltrate. Aortic atherosclerosis. I agree with the radiologist interpretation   Cardiac Monitoring: / EKG:  The patient was maintained on a cardiac monitor.  I personally viewed and interpreted the cardiac monitored which showed an underlying rhythm of: Sinus tachycardia with prolonged QT   Consultations Obtained:  I requested consultation with the general surgery,  and discussed lab and imaging findings as well as pertinent plan - they recommend: Spoke with Dr. Polly,  general surgery, who advised repeating CTAP with oral contrast to evaluate for  active extravasation.   Problem List / ED Course / Critical interventions / Medication management  Patient with past history significant for peripheral artery disease, pulmonary hypertension, prior colonic perforation presents ED with concerns of abdominal pain, nausea, vomiting.  Reports that his symptoms began earlier today.  States that he has not been able to tolerate any p.o. due to significant nausea with persistent vomiting anytime he tries to eat or drink.  No reported fever as far as he has noted. On exam, patient has nonfocal abdominal tenderness.  Bowel sounds somewhat diminished.  No obvious distention. Patient is ill appearing and currently meeting sepsis criteria. Code sepsis initiated for suspected abdominal infection. Patient's lab workup shows leukocytosis at 23.4.  CMP otherwise unremarkable.  Respiratory panel negative, lactic acid initially normal at 1.3 with repeat staying normal at 1.6.  Blood cultures pending.  UA pending. Chest x-ray negative for any acute findings.  CT of the abdomen and pelvis is concerning though for marked severity enteritis with the short segment of the proximal duodenum and numerous small bowel loops within the right upper quadrant posterior lateral mid right abdomen.  There is also some concerns for free air which would be due to associated bowel perforation.  Consultation to general  surgery placed. Spoke with Dr. Polly, general surgery, who will evaluate patient. Suspects surgery likely required. Dr. Polly placed consultation note and advised CTAP with PO contrast to evaluate for active extravasation. If present, consult to general surgery again and likely surgery tonight. Otherwise, will be a medicine admission if no signs of extravasation on CT. I ordered medication including fluids, morphine , Tylenol , Zofran , Dilaudid , Rocephin , metronidazole  for sepsis, pain, nausea Reevaluation of the patient after these medicines showed that the patient improved I have reviewed the patients home medicines and have made adjustments as needed   Social Determinants of Health:  None   Test / Admission - Considered:  Patient requiring admission.  11:18 PM Care of Alexander [name] transferred to [facility] and Dr. Raford at the end of my shift as the patient will require reassessment once labs/imaging have resulted. Patient presentation, ED course, and plan of care discussed with review of all pertinent labs and imaging. Please see his/her note for further details regarding further ED course and disposition. Plan at time of handoff is CTAP with PO contrast. If active extravasation present, reach out to Dr. Polly with surgery. If negative, admit to medicine. This may be altered or completely changed at the discretion of the oncoming team pending results of further workup.   Final diagnoses:  Sepsis without acute organ dysfunction, due to unspecified organism [facility])  Bowel perforation [facility])    ED Discharge Orders     None          Cecily Legrand DELENA DEVONNA 05/27/24 2318    Doretha Folks, MD 05/28/24 2122

## 2024-05-27 NOTE — Telephone Encounter (Signed)
 Transition Care Management Unsuccessful Follow-up Telephone Call  Date of discharge and from where:  05/23/24 [facility] ED  Attempts:  1st Attempt  Reason for unsuccessful TCM follow-up call:  Left voice message  Patient recently seen General Surgery and has follow up appt scheduled with Dr Tanda regarding recent ED visit. No ED follow up needed by PCP office at this time. Advised pt could return my call with any questions or concerns with office cb number.

## 2024-05-27 NOTE — ED Notes (Signed)
 Pt refused to let this RN look to start PIV and requested IV team only.

## 2024-05-27 NOTE — ED Notes (Addendum)
 Attempted to get [facility] [facility] patient agitated and impatient site was bleeding too slow not enough to even complete a blue bottle. Pt not allowing to stick again only wants IV team. RN made aware
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
## 2024-05-27 NOTE — ED Triage Notes (Addendum)
 Pt BIB [facility] from [facility]. Pt endorses n/v that started this morning. He reports 6 episodes of emesis and also endorses generalized abdominal pain. Staff reports pt has been more lethargic than usual as well. Pt has a colostomy bag and is on warfarin. GCS 15  [facility] 118/70BP 124P 18R 112 cbg

## 2024-05-28 ENCOUNTER — Inpatient Hospital Stay (HOSPITAL_COMMUNITY)

## 2024-05-28 ENCOUNTER — Emergency Department (HOSPITAL_COMMUNITY)

## 2024-05-28 DIAGNOSIS — M109 Gout, unspecified: Secondary | ICD-10-CM | POA: Diagnosis not present

## 2024-05-28 DIAGNOSIS — Z833 Family history of diabetes mellitus: Secondary | ICD-10-CM | POA: Diagnosis not present

## 2024-05-28 DIAGNOSIS — Z1152 Encounter for screening for COVID-19: Secondary | ICD-10-CM | POA: Diagnosis not present

## 2024-05-28 DIAGNOSIS — R2689 Other abnormalities of gait and mobility: Secondary | ICD-10-CM | POA: Diagnosis not present

## 2024-05-28 DIAGNOSIS — K2980 Duodenitis without bleeding: Secondary | ICD-10-CM | POA: Diagnosis not present

## 2024-05-28 DIAGNOSIS — N400 Enlarged prostate without lower urinary tract symptoms: Secondary | ICD-10-CM | POA: Diagnosis not present

## 2024-05-28 DIAGNOSIS — M6281 Muscle weakness (generalized): Secondary | ICD-10-CM | POA: Diagnosis not present

## 2024-05-28 DIAGNOSIS — E43 Unspecified severe protein-calorie malnutrition: Secondary | ICD-10-CM | POA: Diagnosis not present

## 2024-05-28 DIAGNOSIS — B9789 Other viral agents as the cause of diseases classified elsewhere: Secondary | ICD-10-CM | POA: Diagnosis not present

## 2024-05-28 DIAGNOSIS — K219 Gastro-esophageal reflux disease without esophagitis: Secondary | ICD-10-CM | POA: Diagnosis not present

## 2024-05-28 DIAGNOSIS — I10 Essential (primary) hypertension: Secondary | ICD-10-CM | POA: Diagnosis not present

## 2024-05-28 DIAGNOSIS — Z86711 Personal history of pulmonary embolism: Secondary | ICD-10-CM | POA: Diagnosis not present

## 2024-05-28 DIAGNOSIS — K631 Perforation of intestine (nontraumatic): Secondary | ICD-10-CM | POA: Insufficient documentation

## 2024-05-28 DIAGNOSIS — I110 Hypertensive heart disease with heart failure: Secondary | ICD-10-CM | POA: Diagnosis not present

## 2024-05-28 DIAGNOSIS — R140 Abdominal distension (gaseous): Secondary | ICD-10-CM | POA: Diagnosis not present

## 2024-05-28 DIAGNOSIS — R531 Weakness: Secondary | ICD-10-CM | POA: Diagnosis not present

## 2024-05-28 DIAGNOSIS — I272 Pulmonary hypertension, unspecified: Secondary | ICD-10-CM | POA: Diagnosis not present

## 2024-05-28 DIAGNOSIS — K529 Noninfective gastroenteritis and colitis, unspecified: Secondary | ICD-10-CM | POA: Diagnosis not present

## 2024-05-28 DIAGNOSIS — G8929 Other chronic pain: Secondary | ICD-10-CM | POA: Diagnosis not present

## 2024-05-28 DIAGNOSIS — K828 Other specified diseases of gallbladder: Secondary | ICD-10-CM | POA: Diagnosis not present

## 2024-05-28 DIAGNOSIS — Z932 Ileostomy status: Secondary | ICD-10-CM | POA: Diagnosis not present

## 2024-05-28 DIAGNOSIS — G4700 Insomnia, unspecified: Secondary | ICD-10-CM | POA: Diagnosis not present

## 2024-05-28 DIAGNOSIS — A419 Sepsis, unspecified organism: Secondary | ICD-10-CM | POA: Diagnosis not present

## 2024-05-28 DIAGNOSIS — D649 Anemia, unspecified: Secondary | ICD-10-CM | POA: Diagnosis not present

## 2024-05-28 DIAGNOSIS — K668 Other specified disorders of peritoneum: Secondary | ICD-10-CM

## 2024-05-28 DIAGNOSIS — R278 Other lack of coordination: Secondary | ICD-10-CM | POA: Diagnosis not present

## 2024-05-28 DIAGNOSIS — I2510 Atherosclerotic heart disease of native coronary artery without angina pectoris: Secondary | ICD-10-CM | POA: Diagnosis not present

## 2024-05-28 DIAGNOSIS — Z7982 Long term (current) use of aspirin: Secondary | ICD-10-CM | POA: Diagnosis not present

## 2024-05-28 DIAGNOSIS — Z8249 Family history of ischemic heart disease and other diseases of the circulatory system: Secondary | ICD-10-CM | POA: Diagnosis not present

## 2024-05-28 DIAGNOSIS — Z87891 Personal history of nicotine dependence: Secondary | ICD-10-CM | POA: Diagnosis not present

## 2024-05-28 DIAGNOSIS — T8131XD Disruption of external operation (surgical) wound, not elsewhere classified, subsequent encounter: Secondary | ICD-10-CM | POA: Diagnosis not present

## 2024-05-28 DIAGNOSIS — R112 Nausea with vomiting, unspecified: Secondary | ICD-10-CM | POA: Diagnosis not present

## 2024-05-28 DIAGNOSIS — I5032 Chronic diastolic (congestive) heart failure: Secondary | ICD-10-CM | POA: Diagnosis not present

## 2024-05-28 DIAGNOSIS — R9431 Abnormal electrocardiogram [ECG] [EKG]: Secondary | ICD-10-CM | POA: Diagnosis not present

## 2024-05-28 DIAGNOSIS — Z7901 Long term (current) use of anticoagulants: Secondary | ICD-10-CM | POA: Diagnosis not present

## 2024-05-28 DIAGNOSIS — I959 Hypotension, unspecified: Secondary | ICD-10-CM | POA: Diagnosis not present

## 2024-05-28 DIAGNOSIS — I739 Peripheral vascular disease, unspecified: Secondary | ICD-10-CM | POA: Diagnosis not present

## 2024-05-28 DIAGNOSIS — Z7401 Bed confinement status: Secondary | ICD-10-CM | POA: Diagnosis not present

## 2024-05-28 DIAGNOSIS — E876 Hypokalemia: Secondary | ICD-10-CM | POA: Diagnosis not present

## 2024-05-28 DIAGNOSIS — K5730 Diverticulosis of large intestine without perforation or abscess without bleeding: Secondary | ICD-10-CM | POA: Diagnosis not present

## 2024-05-28 LAB — URINALYSIS, W/ REFLEX TO CULTURE (INFECTION SUSPECTED)
Bacteria, UA: NONE SEEN
Bilirubin Urine: NEGATIVE
Glucose, UA: NEGATIVE mg/dL
Hgb urine dipstick: NEGATIVE
Ketones, ur: NEGATIVE mg/dL
Leukocytes,Ua: NEGATIVE
Nitrite: NEGATIVE
Protein, ur: NEGATIVE mg/dL
Specific Gravity, Urine: 1.045 — ABNORMAL HIGH (ref 1.005–1.030)
pH: 5 (ref 5.0–8.0)

## 2024-05-28 LAB — TYPE AND SCREEN
ABO/RH(D): B POS
Antibody Screen: NEGATIVE

## 2024-05-28 LAB — BASIC METABOLIC PANEL WITH GFR
Anion gap: 9 (ref 5–15)
BUN: 9 mg/dL (ref 8–23)
CO2: 24 mmol/L (ref 22–32)
Calcium: 9.1 mg/dL (ref 8.9–10.3)
Chloride: 103 mmol/L (ref 98–111)
Creatinine, Ser: 0.83 mg/dL (ref 0.61–1.24)
GFR, Estimated: >60 mL/min (ref >60)
Glucose, Bld: 92 mg/dL (ref 70–99)
Potassium: 3.6 mmol/L (ref 3.5–5.1)
Sodium: 136 mmol/L (ref 135–145)

## 2024-05-28 LAB — RESPIRATORY PANEL BY PCR

## 2024-05-28 LAB — PROTIME-INR
INR: 2.2 — ABNORMAL HIGH (ref 0.8–1.2)
INR: 1.6 — ABNORMAL HIGH (ref 0.8–1.2)
Prothrombin Time: 25.6 s — ABNORMAL HIGH (ref 11.4–15.2)
Prothrombin Time: 20.2 s — ABNORMAL HIGH (ref 11.4–15.2)

## 2024-05-28 LAB — MAGNESIUM: Magnesium: 1.2 mg/dL — ABNORMAL LOW (ref 1.7–2.4)

## 2024-05-28 LAB — CBC
HCT: 29.7 % — ABNORMAL LOW (ref 39.0–52.0)
Hemoglobin: 9.5 g/dL — ABNORMAL LOW (ref 13.0–17.0)
MCH: 30.3 pg (ref 26.0–34.0)
MCHC: 32 g/dL (ref 30.0–36.0)
MCV: 94.6 fL (ref 80.0–100.0)
Platelets: 333 K/uL (ref 150–400)
RBC: 3.14 MIL/uL — ABNORMAL LOW (ref 4.22–5.81)
RDW: 16.7 % — ABNORMAL HIGH (ref 11.5–15.5)
WBC: 16.6 K/uL — ABNORMAL HIGH (ref 4.0–10.5)
nRBC: 0 % (ref 0.0–0.2)

## 2024-05-28 LAB — LACTIC ACID, PLASMA: Lactic Acid, Venous: 1.4 mmol/L (ref 0.5–1.9)

## 2024-05-28 LAB — PHOSPHORUS: Phosphorus: 3.3 mg/dL (ref 2.5–4.6)

## 2024-05-28 MED ORDER — METRONIDAZOLE 500 MG/100ML IV SOLN
500.0000 mg | Freq: Once | INTRAVENOUS | Status: AC
  Administered 2024-05-28: 500 mg via INTRAVENOUS
  Filled 2024-05-28: qty 100

## 2024-05-28 MED ORDER — MAGNESIUM SULFATE 2 GM/50ML IV SOLN
2.0000 g | Freq: Once | INTRAVENOUS | Status: AC
  Administered 2024-05-28: 2 g via INTRAVENOUS
  Filled 2024-05-28: qty 50

## 2024-05-28 MED ORDER — MORPHINE SULFATE (PF) 4 MG/ML IV SOLN
6.0000 mg | INTRAVENOUS | Status: DC | PRN
  Administered 2024-05-28 – 2024-06-11 (×74): 6 mg via INTRAVENOUS
  Filled 2024-05-28 (×26): qty 2
  Filled 2024-05-28: qty 1.5
  Filled 2024-05-28 (×50): qty 2

## 2024-05-28 MED ORDER — HYDROMORPHONE HCL 1 MG/ML IJ SOLN: 0.5000 mg | INTRAMUSCULAR | Status: DC | PRN

## 2024-05-28 MED ORDER — PANTOPRAZOLE SODIUM 40 MG IV SOLR
40.0000 mg | Freq: Two times a day (BID) | INTRAVENOUS | Status: DC
  Administered 2024-05-28 – 2024-06-10 (×27): 40 mg via INTRAVENOUS
  Filled 2024-05-28 (×27): qty 10

## 2024-05-28 MED ORDER — ACETAMINOPHEN 10 MG/ML IV SOLN: 1000.0000 mg | Freq: Four times a day (QID) | INTRAVENOUS | Status: AC | PRN

## 2024-05-28 MED ORDER — MORPHINE SULFATE (PF) 4 MG/ML IV SOLN
3.0000 mg | INTRAVENOUS | Status: DC | PRN
  Administered 2024-05-28 – 2024-06-10 (×7): 3 mg via INTRAVENOUS
  Filled 2024-05-28 (×9): qty 1

## 2024-05-28 MED ORDER — LACTATED RINGERS IV SOLN: INTRAVENOUS | Status: DC

## 2024-05-28 MED ORDER — ALBUTEROL SULFATE (2.5 MG/3ML) 0.083% IN NEBU: 2.5000 mg | INHALATION_SOLUTION | RESPIRATORY_TRACT | Status: DC | PRN

## 2024-05-28 MED ORDER — IOHEXOL 9 MG/ML PO SOLN
500.0000 mL | ORAL | Status: AC
  Administered 2024-05-28: 500 mL via ORAL

## 2024-05-28 MED ORDER — SODIUM CHLORIDE 0.9% FLUSH
3.0000 mL | Freq: Two times a day (BID) | INTRAVENOUS | Status: DC
  Administered 2024-05-28 – 2024-06-11 (×24): 3 mL via INTRAVENOUS

## 2024-05-28 MED ORDER — PIPERACILLIN-TAZOBACTAM 3.375 G IVPB
3.3750 g | Freq: Three times a day (TID) | INTRAVENOUS | Status: DC
  Administered 2024-05-28 – 2024-06-10 (×38): 3.375 g via INTRAVENOUS
  Filled 2024-05-28 (×36): qty 50

## 2024-05-28 MED ORDER — VITAMIN K1 10 MG/ML IJ SOLN
10.0000 mg | Freq: Once | INTRAVENOUS | Status: AC
  Administered 2024-05-28: 10 mg via INTRAVENOUS
  Filled 2024-05-28: qty 1

## 2024-05-28 MED ORDER — HEPARIN (PORCINE) 25000 UT/250ML-% IV SOLN
2200.0000 [IU]/h | INTRAVENOUS | Status: DC
  Administered 2024-05-28: 1000 [IU]/h via INTRAVENOUS
  Administered 2024-05-29: 1500 [IU]/h via INTRAVENOUS
  Administered 2024-05-30 – 2024-06-06 (×12): 2200 [IU]/h via INTRAVENOUS
  Filled 2024-05-28 (×16): qty 250

## 2024-05-28 MED ORDER — ACETAMINOPHEN 325 MG PO TABS
650.0000 mg | ORAL_TABLET | Freq: Four times a day (QID) | ORAL | Status: DC | PRN
  Administered 2024-05-28 – 2024-06-01 (×8): 650 mg via ORAL
  Filled 2024-05-28 (×8): qty 2

## 2024-05-28 MED ORDER — IOHEXOL 300 MG/ML  SOLN
200.0000 mL | Freq: Once | INTRAMUSCULAR | Status: AC | PRN
  Administered 2024-05-28: 200 mL via ORAL

## 2024-05-28 MED ORDER — DAPTOMYCIN-SODIUM CHLORIDE 700-0.9 MG/100ML-% IV SOLN
10.0000 mg/kg | Freq: Once | INTRAVENOUS | Status: AC
  Administered 2024-05-28: 700 mg via INTRAVENOUS
  Filled 2024-05-28: qty 100

## 2024-05-28 MED ORDER — TRIMETHOBENZAMIDE HCL 100 MG/ML IM SOLN
200.0000 mg | Freq: Three times a day (TID) | INTRAMUSCULAR | Status: DC | PRN
  Filled 2024-05-28 (×2): qty 2

## 2024-05-28 MED ORDER — MORPHINE SULFATE (PF) 4 MG/ML IV SOLN
4.0000 mg | Freq: Once | INTRAVENOUS | Status: AC
  Administered 2024-05-28: 4 mg via INTRAVENOUS
  Filled 2024-05-28: qty 1

## 2024-05-28 MED ORDER — HYDROMORPHONE HCL 1 MG/ML IJ SOLN: 0.2000 mg | INTRAMUSCULAR | Status: DC | PRN

## 2024-05-28 MED ORDER — HYDROMORPHONE HCL 1 MG/ML IJ SOLN
1.0000 mg | INTRAMUSCULAR | Status: DC | PRN
  Filled 2024-05-28: qty 1

## 2024-05-28 NOTE — TOC Initial Note (Signed)
 Transition of Care [facility]) - Initial/Assessment Note    Patient Details  Name: Alexander [name] MRN: [id number] Date of Birth: [birth year]  [facility]) CM/SW Contact:    Inocente GORMAN Kindle, LCSW Phone Number: 05/28/2024, 2:06 PM  Clinical Narrative:                 Patient admitted from [facility] where he was completing short term rehab. He will require insurance approval in order to return. Will continue to follow.     Expected Discharge Plan: Skilled Nursing Facility Barriers to Discharge: Continued Medical Work up, English as a second language teacher   Patient Goals and CMS Choice Patient states their goals for this hospitalization and ongoing recovery are:: Rehab          Expected Discharge Plan and Services In-house Referral: Clinical Social Work   Post Acute Care Choice: Skilled Nursing Facility Living arrangements for the past 2 months: Single Family Home, Skilled Nursing Facility                                      Prior Living Arrangements/Services Living arrangements for the past 2 months: Single Family Home, Skilled Nursing Facility   Patient language and need for interpreter reviewed:: Yes Do you feel safe going back to the place where you live?: Yes      Need for Family Participation in Patient Care: Yes (Comment) Care giver support system in place?: Yes (comment) Current home services: DME (walker, BSC, shower chair) Criminal Activity/Legal Involvement Pertinent to Current Situation/Hospitalization: No - Comment as needed  Activities of Daily Living   ADL Screening (condition at time of admission) Independently performs ADLs?: Yes (appropriate for developmental age) Is the patient deaf or have difficulty hearing?: No Does the patient have difficulty seeing, even when wearing glasses/contacts?: No Does the patient have difficulty concentrating, remembering, or making decisions?: No  Permission Sought/Granted Permission sought to share information  with : Facility Medical sales representative, Family Supports Permission granted to share information with : Yes, Verbal Permission Granted  Share Information with NAME: Roderick,Darius- Son [phone]           Emotional Assessment Appearance:: Appears stated age     Orientation: : Oriented to Self, Oriented to Place, Oriented to  Time, Oriented to Situation, Fluctuating Orientation (Suspected and/or reported Sundowners) Alcohol / Substance Use: Not Applicable Psych Involvement: No (comment)  Admission diagnosis:  Bowel perforation (HCC) [K63.1] Perforation bowel (HCC) [K63.1] Sepsis without acute organ dysfunction, due to unspecified organism [facility]) [A41.9] Patient Active Problem List   Diagnosis Date Noted   Intra-abdominal free air of unknown etiology 05/28/2024   Nausea and vomiting 05/28/2024   QT prolongation 05/28/2024   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   Adjustment disorder with mixed anxiety and depressed mood 05/26/2024   Malnutrition of moderate degree 04/23/2024   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   Enteritis 04/12/2024   History of partial colectomy 04/11/2024   Generalized abdominal pain 04/11/2024   Colon cancer screening 04/11/2024   Abdominal wall dehiscence 04/09/2024   Perioperative dehiscence of abdominal wound with evisceration 04/03/2024   Ileostomy in place [facility]) 03/30/2024   Ileostomy care (HCC) 03/20/2024   Irritant contact dermatitis associated with fecal stoma 03/20/2024   Spontaneous perforation of colon (HCC) 01/15/2024   Ascites 01/15/2024   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 01/15/2024   Acute blood loss anemia 01/14/2024   Sepsis (HCC) 01/11/2024  AKI (acute kidney injury) (HCC) 01/11/2024   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   Thrombocytopenia (HCC) 07/13/2023   Pulmonary hypertension (HCC) 07/12/2023   Cor pulmonale (HCC) 07/12/2023   Noncompliance 01/22/2020   High risk medication use 10/30/2019    Hyperlipidemia 10/30/2019   Hepatic steatosis 10/08/2019   Spinal stenosis 10/08/2019   Aortic atherosclerosis (HCC) 10/08/2019   Peripheral arterial disease (HCC) 10/08/2019   Sleep disturbance 10/08/2019   Diverticulosis 10/08/2019   Essential hypertension, benign 09/28/2019   Gout 09/28/2019   History of pulmonary embolus (PE) 04/20/2019   Chronic pain of right ankle 12/12/2016   Chronic foot pain, right 11/02/2012   PCP:  [name], PA Pharmacy:   [facility] [address] [place] - [address] [place] [address] Phone: [phone] Fax: [phone]  [facility] [place], [place] - [address] [place] [address] Phone: [phone] Fax: [phone]     Social Drivers of Health (SDOH) Social History: SDOH Screenings   Food Insecurity: Patient Unable To Answer (05/28/2024)  Housing: Unknown (05/28/2024)  Recent Concern: Housing - High Risk (04/10/2024)  Transportation Needs: Patient Unable To Answer (05/28/2024)  Recent Concern: Transportation Needs - Unmet Transportation Needs (03/30/2024)  Utilities: Not At Risk (05/28/2024)  Depression (PHQ2-9): Low Risk  (07/24/2023)  Financial Resource Strain: Low Risk  (05/01/2024)   Received from [facility]  Social Connections: Moderately Isolated (05/01/2024)   Received from [facility]  Stress: No Stress Concern Present (05/13/2024)   Received from [facility]  Tobacco Use: Medium Risk (05/27/2024)   SDOH Interventions:     Readmission Risk Interventions    05/28/2024    2:04 PM 04/30/2024   12:54 PM 04/10/2024    1:55 PM  Readmission Risk Prevention Plan  Transportation Screening Complete Complete Complete  Medication Review Oceanographer) Complete Complete Complete  PCP or Specialist appointment within 3-5 days of discharge Complete Complete Complete  HRI or Home Care Consult Complete Complete Complete  SW Recovery  Care/Counseling Consult Complete Complete Complete  Palliative Care Screening Not Applicable Not Applicable Not Applicable  Skilled Nursing Facility Complete Not Applicable Not Applicable

## 2024-05-28 NOTE — Consult Note (Addendum)
 WOC Nurse ostomy consult note Stoma type/location: RLQ ileostomy.  Pt is follow by the ostomy clinic, he is well educated and according to the last consult with WOC team: ordered supplies and received from [employer], the outside vendor.  He doesn't want to perform ostomy care, the pouch was intact, no leaking when I visited him in person today, at 1000. Emptied the bag 150 ml of brown liquid output. Ostomy pouching: 1pc. #[id number], ring [id number]. Note: he uses a different type of pouch from a different brand that hospital doesn't have. He refuses to replace his actual bag for afraid of leaking problems. Education provided: Not necessary. Pt is well educated. Enrolled patient in [employer] DC program: Yes, previously.  WOC Nurse Consult Note: Reason for Consult: Abdominal wound. Wound type: Open wounds, surgical. Pressure Injury POA: NA Measurement: 15 cm x 7 cm x 2 cm Wound bed: 100% red. Drainage (amount, consistency, odor) Moderate amount, yellow serous. Periwound: intact Dressing procedure/placement/frequency: Cleanse with saline, pat dry the peri-wound skin. Apply a moist Kerlix with gauze, cover with abd pad and hold with tape. Change daily or PRN.   WOC team will not plan to follow further. Please reconsult if further assistance is needed. Thank-you,  Lela Holm RN, CNS, [employer], MSN.  (Phone [phone])

## 2024-05-28 NOTE — Progress Notes (Signed)
 PHARMACY - ANTICOAGULATION CONSULT NOTE  Pharmacy Consult:  Heparin  Indication: VTE treatment (warfarin PTA on hold)  No Known Allergies  Patient Measurements: Height: 5' 9 (175.3 cm) Weight: 68 kg (150 lb) IBW/kg (Calculated) : 70.7 HEPARIN  DW (KG): 68  Vital Signs: Temp: 100.1 F (37.8 C) (08/28 1214) Temp Source: Oral (08/28 1214) BP: 118/73 (08/28 1718) Pulse Rate: 95 (08/28 1214)  Labs: Recent Labs    05/27/24 2000 05/28/24 0612 05/28/24 1801  HGB 11.1* 9.5*  --   HCT 35.3* 29.7*  --   PLT 438* 333  --   LABPROT 22.0* 25.6* 20.2*  INR 1.8* 2.2* 1.6*  CREATININE 0.89 0.83  --     Estimated Creatinine Clearance: 88.8 mL/min (by C-G formula based on SCr of 0.83 mg/dL).  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 Pharmacy consulted for heparin  gtt for bridge while patient undergoes evaluation for possible procedure.   INR down to 1.6; no bleeding reported.  Goal of Therapy:  Heparin  level 0.3-0.7 units/ml INR goal 2-3  Monitor platelets by anticoagulation protocol: Yes   Plan:  Start IV heparin  at 1000 units/hr - no bolus Check 6 hr heparin  level Daily heparin  level and CBC F/u transition back to PO anticoagulation when appropriate  Kwaku Mostafa D. Lendell, PharmD, BCPS, BCCCP 05/28/2024, 6:36 PM

## 2024-05-28 NOTE — Plan of Care (Signed)

## 2024-05-28 NOTE — Care Plan (Signed)
 This 63 yrs old Male with complex medical hx . 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 He presented with acute onset of N/V, abd pain for one day. Pain is improving with time. Has vomited > 10 times. Appears to be regurgitated material. No hematemesis, coffee ground emesis. No change in ostomy output / hematochezia/melena. His abd pain is gradually improving, better with pain medication in the ED. Denies recent fever, chills, no other recent illness.  CT A&P showed RUQ free air consistent with associated bowel perforation.  Patient was admitted for further evaluation General Surgery was consulted.  No immediate operative intervention recommended at this point.  Continue to keep patient NPO if require emergent surgery.  INR 2.2 patient was given vitamin K  as per general surgery.  Once INR is down patient will be started on IV heparin .  Patient continued on empiric antibiotics ceftriaxone  Flagyl  and Zosyn .  Patient was seen and examined at bedside.  Still reports having significant pain . asking to adjust his pain medications.

## 2024-05-28 NOTE — Progress Notes (Signed)
 Noted results of SBFT showing no contrast extrav. Recommend continued bowel rest, abx, and plan repeat CT 8/31.  Dreama GEANNIE Hanger, MD General and Trauma Surgery [facility]

## 2024-05-28 NOTE — Care Plan (Signed)
 Patient arrived to the unit [id number] Complaining of pain 9/10 in the abdomen No other signs/symptoms of distress Skin assessment done with Johnston OBIE Raring, RN

## 2024-05-28 NOTE — ED Notes (Signed)
 CCMD called and notified

## 2024-05-28 NOTE — Consult Note (Addendum)
 General Surgery Follow Up Note  Subjective:    Overnight Issues:   Objective:  Vital signs for last 24 hours: Temp:  [97.9 F (36.6 C)-100.9 F (38.3 C)] 98.6 F (37 C) (08/28 0723) Pulse Rate:  [84-122] 95 (08/28 0745) Resp:  [11-22] 13 (08/28 0745) BP: (96-124)/(55-76) 105/61 (08/28 0745) SpO2:  [98 %-100 %] 98 % (08/28 0745) Weight:  [68 kg] 68 kg (08/27 1918)  Hemodynamic parameters for last 24 hours:    Intake/Output from previous day: 08/27 0701 - 08/28 0700 In: 1980.8 [I.V.:780.6; IV Piggyback:1200.1] Out: -   Intake/Output this shift: No intake/output data recorded.  Vent settings for last 24 hours:    Physical Exam:  Gen: comfortable, no distress Neuro: follows commands, alert, communicative HEENT: PERRL Neck: supple CV: RRR Pulm: unlabored breathing on RA Abd: soft, NT, midline wound with granulation tissue , ostomy functional Extr: wwp, no edema  Results for orders placed or performed during the hospital encounter of 05/27/24 (from the past 24 hours)  Resp panel by RT-PCR (RSV, Flu A&B, Covid) Anterior Nasal Swab     Status: None   Collection Time: 05/27/24  7:49 PM   Specimen: Anterior Nasal Swab  Result Value Ref Range   SARS Coronavirus 2 by RT PCR NEGATIVE NEGATIVE   Influenza A by PCR NEGATIVE NEGATIVE   Influenza B by PCR NEGATIVE NEGATIVE   Resp Syncytial Virus by PCR NEGATIVE NEGATIVE  Comprehensive metabolic panel     Status: Abnormal   Collection Time: 05/27/24  8:00 PM  Result Value Ref Range   Sodium 136 135 - 145 mmol/L   Potassium 4.1 3.5 - 5.1 mmol/L   Chloride 105 98 - 111 mmol/L   CO2 20 (L) 22 - 32 mmol/L   Glucose, Bld 120 (H) 70 - 99 mg/dL   BUN 12 8 - 23 mg/dL   Creatinine, Ser 9.10 0.61 - 1.24 mg/dL   Calcium  9.6 8.9 - 10.3 mg/dL   Total Protein 8.6 (H) 6.5 - 8.1 g/dL   Albumin  3.1 (L) 3.5 - 5.0 g/dL   AST 19 15 - 41 U/L   ALT 17 0 - 44 U/L   Alkaline Phosphatase 88 38 - 126 U/L   Total Bilirubin 0.8 0.0 - 1.2  mg/dL   GFR, Estimated >39 >39 mL/min   Anion gap 11 5 - 15  CBC with Differential     Status: Abnormal   Collection Time: 05/27/24  8:00 PM  Result Value Ref Range   WBC 23.4 (H) 4.0 - 10.5 K/uL   RBC 3.69 (L) 4.22 - 5.81 MIL/uL   Hemoglobin 11.1 (L) 13.0 - 17.0 g/dL   HCT 64.6 (L) 60.9 - 47.9 %   MCV 95.7 80.0 - 100.0 fL   MCH 30.1 26.0 - 34.0 pg   MCHC 31.4 30.0 - 36.0 g/dL   RDW 83.2 (H) 88.4 - 84.4 %   Platelets 438 (H) 150 - 400 K/uL   nRBC 0.0 0.0 - 0.2 %   Neutrophils Relative % 87 %   Neutro Abs 20.3 (H) 1.7 - 7.7 K/uL   Lymphocytes Relative 6 %   Lymphs Abs 1.4 0.7 - 4.0 K/uL   Monocytes Relative 6 %   Monocytes Absolute 1.4 (H) 0.1 - 1.0 K/uL   Eosinophils Relative 0 %   Eosinophils Absolute 0.0 0.0 - 0.5 K/uL   Basophils Relative 0 %   Basophils Absolute 0.1 0.0 - 0.1 K/uL   Immature Granulocytes 1 %  Abs Immature Granulocytes 0.20 (H) 0.00 - 0.07 K/uL  Protime-INR     Status: Abnormal   Collection Time: 05/27/24  8:00 PM  Result Value Ref Range   Prothrombin  Time 22.0 (H) 11.4 - 15.2 seconds   INR 1.8 (H) 0.8 - 1.2  I-Stat Lactic Acid, ED     Status: None   Collection Time: 05/27/24  8:03 PM  Result Value Ref Range   Lactic Acid, Venous 1.3 0.5 - 1.9 mmol/L  I-Stat Lactic Acid, ED     Status: None   Collection Time: 05/27/24  9:59 PM  Result Value Ref Range   Lactic Acid, Venous 1.6 0.5 - 1.9 mmol/L  Urinalysis, w/ Reflex to Culture (Infection Suspected) -Urine, Clean Catch     Status: Abnormal   Collection Time: 05/27/24 11:00 PM  Result Value Ref Range   Specimen Source URINE, CLEAN CATCH    Color, Urine YELLOW YELLOW   APPearance CLEAR CLEAR   Specific Gravity, Urine 1.045 (H) 1.005 - 1.030   pH 5.0 5.0 - 8.0   Glucose, UA NEGATIVE NEGATIVE mg/dL   Hgb urine dipstick NEGATIVE NEGATIVE   Bilirubin Urine NEGATIVE NEGATIVE   Ketones, ur NEGATIVE NEGATIVE mg/dL   Protein, ur NEGATIVE NEGATIVE mg/dL   Nitrite NEGATIVE NEGATIVE   Leukocytes,Ua  NEGATIVE NEGATIVE   RBC / HPF 0-5 0 - 5 RBC/hpf   WBC, UA 0-5 0 - 5 WBC/hpf   Bacteria, UA NONE SEEN NONE SEEN   Squamous Epithelial / HPF 0-5 0 - 5 /HPF   Mucus PRESENT    Hyaline Casts, UA PRESENT   Type and screen     Status: None   Collection Time: 05/28/24  6:09 AM  Result Value Ref Range   ABO/RH(D) B POS    Antibody Screen NEG    Sample Expiration      05/31/2024,2359 Performed at [facility], [address], [place], [place] [address]   Basic metabolic panel with GFR     Status: None   Collection Time: 05/28/24  6:12 AM  Result Value Ref Range   Sodium 136 135 - 145 mmol/L   Potassium 3.6 3.5 - 5.1 mmol/L   Chloride 103 98 - 111 mmol/L   CO2 24 22 - 32 mmol/L   Glucose, Bld 92 70 - 99 mg/dL   BUN 9 8 - 23 mg/dL   Creatinine, Ser 9.16 0.61 - 1.24 mg/dL   Calcium  9.1 8.9 - 10.3 mg/dL   GFR, Estimated >39 >39 mL/min   Anion gap 9 5 - 15  CBC     Status: Abnormal   Collection Time: 05/28/24  6:12 AM  Result Value Ref Range   WBC 16.6 (H) 4.0 - 10.5 K/uL   RBC 3.14 (L) 4.22 - 5.81 MIL/uL   Hemoglobin 9.5 (L) 13.0 - 17.0 g/dL   HCT 70.2 (L) 60.9 - 47.9 %   MCV 94.6 80.0 - 100.0 fL   MCH 30.3 26.0 - 34.0 pg   MCHC 32.0 30.0 - 36.0 g/dL   RDW 83.2 (H) 88.4 - 84.4 %   Platelets 333 150 - 400 K/uL   nRBC 0.0 0.0 - 0.2 %  Magnesium      Status: Abnormal   Collection Time: 05/28/24  6:12 AM  Result Value Ref Range   Magnesium  1.2 (L) 1.7 - 2.4 mg/dL  Phosphorus     Status: None   Collection Time: 05/28/24  6:12 AM  Result Value Ref Range  Phosphorus 3.3 2.5 - 4.6 mg/dL  Protime-INR     Status: Abnormal   Collection Time: 05/28/24  6:12 AM  Result Value Ref Range   Prothrombin  Time 25.6 (H) 11.4 - 15.2 seconds   INR 2.2 (H) 0.8 - 1.2  Lactic acid, plasma     Status: None   Collection Time: 05/28/24  6:12 AM  Result Value Ref Range   Lactic Acid, Venous 1.4 0.5 - 1.9 mmol/L    Assessment & Plan: The plan of care was discussed with the bedside nurse for  the day, who is in agreement with this plan and no additional concerns were raised.   Present on Admission:  Enteritis  Sepsis (HCC)    LOS: 0 days   Additional comments:I reviewed the patient's new clinical lab test results.  , I reviewed the patients new imaging test results.  , and I discussed the patient's imaging with Dr. Hughes.  68M with complex medical and surgical history, most recent surgery 04/13/24 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 Tanda. Known hostile abdomen. Presents with n/v and found to have small volume pneumoperitoneum in the RUQ on CT. Imaging repeated with PO contrast and concern for layering extraluminal contrast. I personally reviewed the CT with a radiologist, Dr. Hughes, this morning, who expresses concern that the loops of bowel in the RUQ under the liver may be the source of the pneumoperitoneum and also that the mesentery of this small bowel appears mildly edematous. The patient's clinical stability, normal LA x3, and improving WBC suggests that this is not a surgical emergency and I recommend further investigation. I ordered a plain film to evaluate for residual extraluminal contrast after the PO contrasted CT none was visualized. Will proceed with a SBFT to try to rule in or out SB perforation. If present, this patient is extremely high risk for surgical intervention and barring clinical decline, I would recommend nonoperative management and plan repeat CT in 72h. This may be a small bowel perforation that has sealed itself off. I do worry about the mesenteric edema and would recommend continued serial abdominal exams and LA. Continue broad spectrum abx. Would recommend vitK admin to correct INR in case of sudden clinical decline and emergent surgical indication. Okay to start heparin  drip without bolus due to indication for [facility] being h/o PE. Given the complexity of this case, I have discussed this with two of my senior  partners who are in agreement with this plan. I have also provided a clinical update to the primary service. FEN - strict NPO, absolutely no sips of anything, no ice chips, and no medications. Would recommend early initiation of TPN DVT - SCDs, per primary as above Dispo - per primary    Dreama GEANNIE Hanger, MD Trauma & General Surgery Please use [email or web address] to contact on call provider  05/28/2024  *Care during the described time interval was provided by me. I have reviewed this patient's available data, including medical history, events of note, physical examination and test results as part of my evaluation.

## 2024-05-28 NOTE — ED Notes (Signed)
 Patient transported to CT

## 2024-05-28 NOTE — H&P (Addendum)
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 that the patient will be medically stable for discharge from the hospital within 2 midnights of admission.   * I certify that at the point of admission it is my clinical judgment that the patient will require inpatient hospital care spanning beyond 2 midnights from the point of admission due to high intensity of service, high risk for further deterioration and high frequency of surveillance required.*   Dorn Dawson, MD [facility]  How to contact the [facility] Attending or Consulting provider 7A - 7P or covering provider during after hours 7P -7A, for this patient.  Check the care team in [facility] and look for a) attending/consulting [facility] provider listed and b) the [facility] team listed Log into [email or web address] and use [facility] universal password to access. If you do not have the password, please contact the hospital operator. Locate  the [facility] provider you  are looking for under [facility] and page to a number that you can be directly reached. If you still have difficulty reaching the provider, please page the [facility] (Director on Call) for the Hospitalists listed on amion for assistance.  05/28/2024, 4:43 AM
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
## 2024-05-29 ENCOUNTER — Other Ambulatory Visit: Payer: Self-pay

## 2024-05-29 DIAGNOSIS — K668 Other specified disorders of peritoneum: Secondary | ICD-10-CM

## 2024-05-29 LAB — CBC
HCT: 26.6 % — ABNORMAL LOW (ref 39.0–52.0)
Hemoglobin: 8.7 g/dL — ABNORMAL LOW (ref 13.0–17.0)
MCH: 30.2 pg (ref 26.0–34.0)
MCHC: 32.7 g/dL (ref 30.0–36.0)
MCV: 92.4 fL (ref 80.0–100.0)
Platelets: 266 K/uL (ref 150–400)
RBC: 2.88 MIL/uL — ABNORMAL LOW (ref 4.22–5.81)
RDW: 16.3 % — ABNORMAL HIGH (ref 11.5–15.5)
WBC: 11.9 K/uL — ABNORMAL HIGH (ref 4.0–10.5)
nRBC: 0 % (ref 0.0–0.2)

## 2024-05-29 LAB — COMPREHENSIVE METABOLIC PANEL WITH GFR
ALT: 11 U/L (ref 0–44)
AST: 13 U/L — ABNORMAL LOW (ref 15–41)
Albumin: 2.1 g/dL — ABNORMAL LOW (ref 3.5–5.0)
Alkaline Phosphatase: 69 U/L (ref 38–126)
Anion gap: 11 (ref 5–15)
BUN: 8 mg/dL (ref 8–23)
CO2: 22 mmol/L (ref 22–32)
Calcium: 8.8 mg/dL — ABNORMAL LOW (ref 8.9–10.3)
Chloride: 102 mmol/L (ref 98–111)
Creatinine, Ser: 0.73 mg/dL (ref 0.61–1.24)
GFR, Estimated: >60 mL/min (ref >60)
Glucose, Bld: 88 mg/dL (ref 70–99)
Potassium: 3.3 mmol/L — ABNORMAL LOW (ref 3.5–5.1)
Sodium: 135 mmol/L (ref 135–145)
Total Bilirubin: 0.8 mg/dL (ref 0.0–1.2)
Total Protein: 6.3 g/dL — ABNORMAL LOW (ref 6.5–8.1)

## 2024-05-29 LAB — HEPARIN LEVEL (UNFRACTIONATED)
Heparin Unfractionated: <0.1 [IU]/mL — ABNORMAL LOW (ref 0.30–0.70)
Heparin Unfractionated: <0.1 [IU]/mL — ABNORMAL LOW (ref 0.30–0.70)

## 2024-05-29 LAB — PROCALCITONIN: Procalcitonin: 3.23 ng/mL

## 2024-05-29 LAB — GLUCOSE, CAPILLARY: Glucose-Capillary: 149 mg/dL — ABNORMAL HIGH (ref 70–99)

## 2024-05-29 LAB — PHOSPHORUS: Phosphorus: 3.7 mg/dL (ref 2.5–4.6)

## 2024-05-29 LAB — MAGNESIUM: Magnesium: 1.6 mg/dL — ABNORMAL LOW (ref 1.7–2.4)

## 2024-05-29 MED ORDER — POTASSIUM CHLORIDE 2 MEQ/ML IV SOLN
INTRAVENOUS | Status: DC
  Filled 2024-05-29: qty 1000

## 2024-05-29 MED ORDER — POTASSIUM CHLORIDE 10 MEQ/100ML IV SOLN
10.0000 meq | INTRAVENOUS | Status: AC
  Administered 2024-05-29 (×3): 10 meq via INTRAVENOUS
  Filled 2024-05-29 (×3): qty 100

## 2024-05-29 MED ORDER — POTASSIUM CHLORIDE 2 MEQ/ML IV SOLN: INTRAVENOUS | Status: DC

## 2024-05-29 MED ORDER — LACTATED RINGERS IV SOLN: INTRAVENOUS | Status: AC

## 2024-05-29 MED ORDER — POTASSIUM CHLORIDE 2 MEQ/ML IV SOLN
INTRAVENOUS | Status: DC
  Filled 2024-05-29 (×2): qty 1000

## 2024-05-29 MED ORDER — POTASSIUM CHLORIDE 10 MEQ/100ML IV SOLN
10.0000 meq | INTRAVENOUS | Status: DC
  Administered 2024-05-29: 10 meq via INTRAVENOUS
  Filled 2024-05-29: qty 100

## 2024-05-29 MED ORDER — SODIUM CHLORIDE 0.9% FLUSH: 10.0000 mL | INTRAVENOUS | Status: DC | PRN

## 2024-05-29 MED ORDER — SODIUM CHLORIDE 0.9% FLUSH
10.0000 mL | Freq: Two times a day (BID) | INTRAVENOUS | Status: DC
  Administered 2024-05-30 – 2024-06-05 (×12): 10 mL
  Administered 2024-06-05: 20 mL
  Administered 2024-06-06 – 2024-06-11 (×12): 10 mL

## 2024-05-29 MED ORDER — MAGNESIUM SULFATE 4 GM/100ML IV SOLN
4.0000 g | Freq: Once | INTRAVENOUS | Status: AC
  Administered 2024-05-29: 4 g via INTRAVENOUS
  Filled 2024-05-29: qty 100

## 2024-05-29 MED ORDER — THIAMINE HCL 100 MG/ML IJ SOLN
100.0000 mg | Freq: Every day | INTRAMUSCULAR | Status: AC
  Administered 2024-05-29 – 2024-06-02 (×5): 100 mg via INTRAVENOUS
  Filled 2024-05-29 (×5): qty 2

## 2024-05-29 MED ORDER — TRAVASOL 10 % IV SOLN
INTRAVENOUS | Status: AC
  Filled 2024-05-29: qty 518.4

## 2024-05-29 MED ORDER — CHLORHEXIDINE GLUCONATE CLOTH 2 % EX PADS
6.0000 | MEDICATED_PAD | Freq: Every day | CUTANEOUS | Status: DC
  Administered 2024-05-30 – 2024-06-11 (×12): 6 via TOPICAL

## 2024-05-29 MED ORDER — INSULIN ASPART 100 UNIT/ML IJ SOLN: 0.0000 [IU] | INTRAMUSCULAR | Status: DC

## 2024-05-29 MED ORDER — LINEZOLID 600 MG/300ML IV SOLN
600.0000 mg | Freq: Two times a day (BID) | INTRAVENOUS | Status: DC
  Administered 2024-05-29 – 2024-06-10 (×25): 600 mg via INTRAVENOUS
  Filled 2024-05-29 (×26): qty 300

## 2024-05-29 NOTE — Progress Notes (Signed)
 PT Cancellation Note  Patient Details Name: Alexander [name] MRN: [id number] DOB: [birth year]   Cancelled Treatment:    Reason Eval/Treat Not Completed: Patient declined, no reason specified (Aint gonna be no physical therapy today. Despite encouragement pt still refused stating that I'm not in the mood today.)   [name] 05/29/2024, 11:06 AM
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
## 2024-05-29 NOTE — Progress Notes (Signed)
 Peripherally Inserted Central Catheter Placement  The IV Nurse has discussed with the patient and/or persons authorized to consent for the patient, the purpose of this procedure and the potential benefits and risks involved with this procedure.  The benefits include less needle sticks, lab draws from the catheter, and the patient may be discharged home with the catheter. Risks include, but not limited to, infection, bleeding, blood clot (thrombus formation), and puncture of an artery; nerve damage and irregular heartbeat and possibility to perform a PICC exchange if needed/ordered by physician.  Alternatives to this procedure were also discussed.  Bard Power PICC patient education guide, fact sheet on infection prevention and patient information card has been provided to patient /or left at bedside.    PICC Placement Documentation  PICC Double Lumen 05/29/24 Left Basilic 46 cm 2 cm (Active)  Indication for Insertion or Continuance of Line Administration of hyperosmolar/irritating solutions (i.e. TPN, Vancomycin, etc.) 05/29/24 1632  Exposed Catheter (cm) 2 cm 05/29/24 1632  Site Assessment Clean, Dry, Intact 05/29/24 1632  Lumen #1 Status Flushed;Blood return noted;Saline locked 05/29/24 1632  Lumen #2 Status Flushed;Blood return noted;Saline locked 05/29/24 1632  Dressing Type Transparent 05/29/24 1632  Dressing Status Antimicrobial disc/dressing in place 05/29/24 1632  Line Care Connections checked and tightened 05/29/24 1632  Line Adjustment (NICU/IV Team Only) No 05/29/24 1632  Dressing Intervention New dressing 05/29/24 1632  Dressing Change Due 06/05/24 05/29/24 1632       Alexander [name] 05/29/2024, 4:34 PM

## 2024-05-29 NOTE — TOC Progression Note (Signed)
 Transition of Care [facility]) - Progression Note    Patient Details  Name: Alexander [name] MRN: [id number] Date of Birth: June 13, 1961  Transition of Care [facility]) CM/SW Contact  Lauraine FORBES Saa, LCSWA Phone Number: 05/29/2024, 11:26 AM  Clinical Narrative:     11:26 AM CSW awaiting PT/OT orders for [facility] and SNF insurance authorization.  Expected Discharge Plan: Skilled Nursing Facility Barriers to Discharge: Continued Medical Work up, English as a second language teacher               Expected Discharge Plan and Services In-house Referral: Clinical Social Work   Post Acute Care Choice: Skilled Nursing Facility Living arrangements for the past 2 months: Single Family Home, Skilled Nursing Facility                                       Social Drivers of Health (SDOH) Interventions SDOH Screenings   Food Insecurity: Patient Unable To Answer (05/28/2024)  Housing: Unknown (05/28/2024)  Recent Concern: Housing - High Risk (04/10/2024)  Transportation Needs: Patient Unable To Answer (05/28/2024)  Recent Concern: Transportation Needs - Unmet Transportation Needs (03/30/2024)  Utilities: Not At Risk (05/28/2024)  Depression (PHQ2-9): Low Risk  (07/24/2023)  Financial Resource Strain: Low Risk  (05/01/2024)   Received from [facility]  Social Connections: Moderately Isolated (05/01/2024)   Received from [facility]  Stress: No Stress Concern Present (05/13/2024)   Received from [facility]  Tobacco Use: Medium Risk (05/27/2024)    Readmission Risk Interventions    05/28/2024    2:04 PM 04/30/2024   12:54 PM 04/10/2024    1:55 PM  Readmission Risk Prevention Plan  Transportation Screening Complete Complete Complete  Medication Review Oceanographer) Complete Complete Complete  PCP or Specialist appointment within 3-5 days of discharge Complete Complete Complete  HRI or Home Care Consult Complete Complete Complete  SW Recovery Care/Counseling Consult Complete Complete Complete   Palliative Care Screening Not Applicable Not Applicable Not Applicable  Skilled Nursing Facility Complete Not Applicable Not Applicable

## 2024-05-29 NOTE — Progress Notes (Signed)
 PROGRESS NOTE                                                                                                                                                                                                             Patient Demographics:    Alexander [name], is a 63 y.o. male, DOB - April 04, 1961, FMW:[id number]  Outpatient Primary MD for the patient is [name], [name]    LOS - 1  Admit date - 05/27/2024    Chief Complaint  Patient presents with   Abdominal Pain   Nausea   Weakness   Emesis       Brief Narrative (HPI from H&P)   63 y.o. 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 Presented with acute onset of N/V, abd pain, imaging was suggestive of some free air in the abdomen which probably contained perforation, seen by general surgery and admitted to the hospital.   Subjective:    Alexander [name] today has, No headache, No chest pain, still having abdominal pain, passing flatus, no Nausea, No new weakness tingling or numbness, no shortness of breath   Assessment  & Plan :   Sepsis, nausea vomiting, secondary to possible contained intra-abdominal perforation.  Patient with history of multiple abdominal surgeries and recent  intra-abdominal abscess, has ileostomy.      He has been seen by general surgery, he has complicated history of multiple abdominal surgeries, recent intra-abdominal abscess, ileostomy.  There is suspicion of contained perforation, he has been treated conservatively with bowel rest, IV fluids, pain control and antibiotics.  Will defer management of this issue to general surgery who is following the patient.    Note patient has chronic pain and is on high doses of narcotics at home, use with caution as blood pressure borderline low.  Sepsis pathophysiology is improving, follow cultures.   Prolonged Qtc  Avoid QT prolonging medications, replace electrolytes, monitor on telemetry.   Hypomagnesemia and hypokalemia.  Replaced.    Chronic medical problems: Holding all meds with strict NPO for now   Complex Surgical Hx per above, noted   DVT/PE: Hold Warfarin in case requires surgery. INR < 2 at present. Trend daily. Continue  Heparin  drip.  Diastolic HF: Without exacerbation, dry clinically.   HTN: not on antiHTN at time of admission   Anemia: Recent baseline ~ 8, hemoconcentrated on admission anticipate will drop towards 8. Holding iron , type screen and monitor.  Chronic pain: Home regimen is Dilaudid  2 mg PO q 4 hr prn, see acute pain management above. Holding home Gabapentin .   ?insomnia/mood: holding trazodone    ?? ASCVD: Hold aspirin ; unclear indication since on Warfarin per above      Condition - Extremely Guarded  Family Communication  : None present  Code Status : Full code  Consults  : General surgery  PUD Prophylaxis : PPI   Procedures  :     Small bowel follow-through 05/28/2024.  No extravasation of contrast.    CT 05/28/2024- 1. Findings which remain concerning for the presence of contained free air within the mid right abdomen and right upper quadrant, as described above. 2. Markedly inflamed, partially opacified small bowel loops within the mid and upper right  abdomen. 3. Curvilinear area of oral contrast within the lateral aspect of the mid to lower right abdomen which may be within a decompressed small bowel loop, however, intraluminal location cannot completely be confirmed. 4. Stable moderate to marked severity areas of atelectasis and/or infiltrate within the bilateral lower lobes. 5. Colonic diverticulosis. 6. Aortic atherosclerosis.      Disposition Plan  :    Status is: Inpatient  DVT Prophylaxis  :  Hep gtt  SCDs Start: 05/28/24 0530    Lab Results  Component Value Date   PLT 266 05/29/2024    Diet :  Diet Order             Diet NPO time specified Except for: Sips with Meds  Diet effective now                    Inpatient Medications  Scheduled Meds:  pantoprazole  (PROTONIX ) IV  40 mg Intravenous Q12H   sodium chloride  flush  3 mL Intravenous Q12H   Continuous Infusions:  heparin  1,500 Units/hr (05/29/24 0542)   lactated ringers  1,000 mL with potassium chloride  40 mEq infusion     magnesium  sulfate bolus IVPB     piperacillin -tazobactam (ZOSYN )  IV 3.375 g (05/29/24 0416)   PRN Meds:.acetaminophen , albuterol , morphine  injection **OR** morphine  injection, trimethobenzamide     Objective:   Vitals:   05/28/24 1718 05/28/24 2000 05/29/24 0400 05/29/24 0500  BP: 118/73     Pulse:  (!) 104 82   Resp: 19     Temp:  (!) 100.4 F (38 C) 99 F (37.2 C)   TempSrc:  Oral Oral   SpO2:  100%    Weight:    64.4 kg  Height:        Wt Readings from Last 3 Encounters:  05/29/24 64.4 kg  04/29/24 55 kg  04/06/24 71 kg     Intake/Output Summary (Last 24 hours) at 05/29/2024 0732 Last data filed at 05/28/2024 2004 Gross per 24 hour  Intake 300 ml  Output 650 ml  Net -350 ml  Physical Exam  Awake Alert, No new F.N deficits, Normal affect [place].AT,PERRAL Supple Neck, No JVD,   Symmetrical Chest wall movement, Good air movement bilaterally, CTAB RRR,No Gallops,Rubs or new Murmurs,  +ve B.Sounds, Abd Soft,  ileostomy present, tenderness throughout the abdomen mostly in the upper quadrants No Cyanosis, Clubbing or edema        Data Review:    Recent Labs  Lab 05/27/24 2000 05/28/24 0612 05/29/24 0422  WBC 23.4* 16.6* 11.9*  HGB 11.1* 9.5* 8.7*  HCT 35.3* 29.7* 26.6*  PLT 438* 333 266  MCV 95.7 94.6 92.4  MCH 30.1 30.3 30.2  MCHC 31.4 32.0 32.7  RDW 16.7* 16.7* 16.3*  LYMPHSABS 1.4  --   --   MONOABS 1.4*  --   --   EOSABS 0.0  --   --   BASOSABS 0.1  --   --     Recent Labs  Lab 05/27/24 2000 05/27/24 2003 05/27/24 2159 05/28/24 0612 05/28/24 1801 05/29/24 0422  NA 136  --   --  136  --  135  K 4.1  --   --  3.6  --  3.3*  CL 105  --   --  103  --  102  CO2 20*  --   --  24  --  22  ANIONGAP 11  --   --  9  --  11  GLUCOSE 120*  --   --  92  --  88  BUN 12  --   --  9  --  8  CREATININE 0.89  --   --  0.83  --  0.73  AST 19  --   --   --   --  13*  ALT 17  --   --   --   --  11  ALKPHOS 88  --   --   --   --  69  BILITOT 0.8  --   --   --   --  0.8  ALBUMIN  3.1*  --   --   --   --  2.1*  LATICACIDVEN  --  1.3 1.6 1.4  --   --   INR 1.8*  --   --  2.2* 1.6*  --   MG  --   --   --  1.2*  --  1.6*  PHOS  --   --   --  3.3  --  3.7  CALCIUM  9.6  --   --  9.1  --  8.8*      Recent Labs  Lab 05/27/24 2000 05/27/24 2003 05/27/24 2159 05/28/24 0612 05/28/24 1801 05/29/24 0422  LATICACIDVEN  --  1.3 1.6 1.4  --   --   INR 1.8*  --   --  2.2* 1.6*  --   MG  --   --   --  1.2*  --  1.6*  CALCIUM  9.6  --   --  9.1  --  8.8*    --------------------------------------------------------------------------------------------------------------- Lab Results  Component Value Date   CHOL 114 12/03/2023   HDL 43.40 12/03/2023   LDLCALC 58 12/03/2023   TRIG 48 02/03/2024   CHOLHDL 3 12/03/2023    Lab Results  Component Value Date   HGBA1C 5.9 12/03/2023   No results for input(s): TSH, T4TOTAL, FREET4, T3FREE, THYROIDAB in the last 72 hours. No results  for input(s): VITAMINB12, FOLATE, FERRITIN, TIBC, IRON , RETICCTPCT in the last 72 hours. ------------------------------------------------------------------------------------------------------------------ Cardiac Enzymes  No results for input(s): CKMB, TROPONINI, MYOGLOBIN in the last 168 hours.  Invalid input(s): CK  Micro Results Recent Results (from the past 240 hours)  Resp panel by RT-PCR (RSV, Flu A&B, Covid) Anterior Nasal Swab     Status: None   Collection Time: 05/27/24  7:49 PM   Specimen: Anterior Nasal Swab  Result Value Ref Range Status   SARS Coronavirus 2 by RT PCR NEGATIVE NEGATIVE Final   Influenza A by PCR NEGATIVE NEGATIVE Final   Influenza B by PCR NEGATIVE NEGATIVE Final    Comment: (NOTE) The Xpert Xpress SARS-CoV-2/FLU/RSV plus assay is intended as an aid in the diagnosis of influenza from Nasopharyngeal swab specimens and should not be used as a sole basis for treatment. Nasal washings and aspirates are unacceptable for Xpert Xpress SARS-CoV-2/FLU/RSV testing.  Fact Sheet for Patients: BloggerCourse.com  Fact Sheet for Healthcare Providers: SeriousBroker.it  This test is not yet approved or cleared by the [place]  FDA and has been authorized for detection and/or diagnosis of SARS-CoV-2 by FDA under an Emergency Use Authorization (EUA). This EUA will remain in effect (meaning this test can be used) for the duration of the COVID-19 declaration under Section 564(b)(1) of the Act, 21 U.S.C. section 360bbb-3(b)(1), unless the authorization is terminated or revoked.     Resp Syncytial Virus by PCR NEGATIVE NEGATIVE Final    Comment: (NOTE) Fact Sheet for Patients: BloggerCourse.com  Fact Sheet for Healthcare Providers: SeriousBroker.it  This test is not yet approved or cleared by the [place]  FDA and has been authorized for  detection and/or diagnosis of SARS-CoV-2 by FDA under an Emergency Use Authorization (EUA). This EUA will remain in effect (meaning this test can be used) for the duration of the COVID-19 declaration under Section 564(b)(1) of the Act, 21 U.S.C. section 360bbb-3(b)(1), unless the authorization is terminated or revoked.  Performed at [facility], [address], [place], [place] [address]   Respiratory (~20 pathogens) panel by PCR     Status: Abnormal   Collection Time: 05/28/24  8:58 AM   Specimen: Nasopharyngeal Swab; Respiratory  Result Value Ref Range Status   Adenovirus NOT DETECTED NOT DETECTED Final   Coronavirus 229E NOT DETECTED NOT DETECTED Final    Comment: (NOTE) The Coronavirus on the Respiratory Panel, DOES NOT test for the novel  Coronavirus (2019 nCoV)    Coronavirus HKU1 NOT DETECTED NOT DETECTED Final   Coronavirus NL63 NOT DETECTED NOT DETECTED Final   Coronavirus OC43 NOT DETECTED NOT DETECTED Final   Metapneumovirus NOT DETECTED NOT DETECTED Final   Rhinovirus / Enterovirus DETECTED (A) NOT DETECTED Final   Influenza A NOT DETECTED NOT DETECTED Final   Influenza B NOT DETECTED NOT DETECTED Final   Parainfluenza Virus 1 NOT DETECTED NOT DETECTED Final   Parainfluenza Virus 2 NOT DETECTED NOT DETECTED Final   Parainfluenza Virus 3 NOT DETECTED NOT DETECTED Final   Parainfluenza Virus 4 NOT DETECTED NOT DETECTED Final   Respiratory Syncytial Virus NOT DETECTED NOT DETECTED Final   Bordetella pertussis NOT DETECTED NOT DETECTED Final   Bordetella Parapertussis NOT DETECTED NOT DETECTED Final   Chlamydophila pneumoniae NOT DETECTED NOT DETECTED Final   Mycoplasma pneumoniae NOT DETECTED NOT DETECTED Final    Comment: Performed at [facility], [address], [place], [place] [address]    Radiology Report DG SMALL BOWEL W SINGLE CM (SOL OR THIN BA) Result Date: 05/28/2024 CLINICAL DATA:  [id number] Free intraperitoneal air [phone] Patient with  abdominal  pain, a CT abdomen with concern for free air in the mid upper abdomen. Radiology consulted for water -soluble small bowel follow-through. EXAM: SMALL BOWEL SERIES COMPARISON:  CT abdomen pelvis 05/28/24 TECHNIQUE: Following ingestion of 200 mL of water  soluble contrast (Omnipaque  300), serial small bowel images were obtained, with primary focus of the concerning area in the RUQ. FLUOROSCOPY: Radiation Exposure Index (if provided by the fluoroscopic device): 3.20 mGy Number of Acquired Spot Images: 4 FINDINGS: Scout Radiograph: Nonobstructed bowel gas pattern. Postsurgical changes with staple line projecting at the LEFT upper quadrant. Stomach: Normal appearance. No hiatus hernia. Gastric emptying: Normal. Duodenum:  Normal appearance. Other: Follow-up imaging demonstrating nondilated small bowel, intraluminal contrast at the RIGHT upper quadrant without overt extraluminal extravasation. Contrast flows out of the region of interest at the RIGHT upper quadrant into nondilated distal small bowel. IMPRESSION: 1. Nonobstructed small bowel. 2. Intraluminal contrast at the RIGHT upper quadrant, without overt extraluminal extravasation. Contrast flows out of the region interest, into nondilated distal small bowel. Performed by: Wyatt Pommier, PA-C under supervision of Thom Hall, MD Electronically Signed   By: Thom Hall M.D.   On: 05/28/2024 16:46   DG Abd 1 View Result Date: 05/28/2024 CLINICAL DATA:  [id number] Free intraperitoneal air [id number]. EXAM: ABDOMEN - 1 VIEW COMPARISON:  04/14/2024. FINDINGS: The bowel gas pattern is non-obstructive. There is paucity of bowel gas. No evidence of pneumoperitoneum, within the limitations of a supine film. No acute osseous abnormalities. The soft tissues are within normal limits. Surgical changes, devices, tubes and lines: None. There is excreted contrast over the urinary bladder region. Otherwise, no significant positive oral contrast noted over the bowel loops region. IMPRESSION:  *Nonobstructive bowel gas pattern. No evidence of pneumoperitoneum. Electronically Signed   By: Ree Molt M.D.   On: 05/28/2024 08:46   CT ABDOMEN PELVIS WO CONTRAST Result Date: 05/28/2024 CLINICAL DATA:  Sepsis. EXAM: CT ABDOMEN AND PELVIS WITHOUT CONTRAST TECHNIQUE: Multidetector CT imaging of the abdomen and pelvis was performed following the standard protocol without IV contrast. RADIATION DOSE REDUCTION: This exam was performed according to the departmental dose-optimization program which includes automated exposure control, adjustment of the mA and/or kV according to patient size and/or use of iterative reconstruction technique. COMPARISON:  May 27, 2024 FINDINGS: Lower chest: Stable moderate to marked severity areas of atelectasis and/or infiltrate are seen within the bilateral lower lobes. Hepatobiliary: No focal liver abnormality is seen. The gallbladder is moderately distended, without gallstones, gallbladder wall thickening, or biliary dilatation. Pancreas: Unremarkable. No pancreatic ductal dilatation or surrounding inflammatory changes. Spleen: Normal in size without focal abnormality. Adrenals/Urinary Tract: Adrenal glands are unremarkable. Kidneys are normal, without renal calculi, focal lesion, or hydronephrosis. A large amount of contrast is seen within the lumen of an otherwise normal appearing urinary bladder. Stomach/Bowel: Mild, stable gastric wall thickening is seen along the anterior aspect of the gastric body. There is mild duodenal bulb thickening which is decreased in severity when compared to the prior study. The short segment of dilated proximal duodenum seen on the prior study is normal in caliber on the current exam (axial CT images 28 through 32, CT series 3). The segment of proximal duodenum distal to this region remains thickened (axial CT image 30 through 35, CT series 3). Numerous foci of air are again seen within the mid right abdomen and right upper quadrant (axial CT  images 30 through 41, CT series 3). Intraluminal location of these areas of air is not clear and remain  concerning for the presence of contained free air. Markedly inflamed, partially opacified small bowel loops are again seen within this region the mid and upper right abdomen. A curvilinear area of oral contrast is seen within the lateral aspect of the mid to lower right abdomen (axial CT images 40 through 46, CT series 3/coronal reformatted images 36 through 42, CT series 6). This may be within a decompressed small bowel loop, however, intraluminal location cannot completely be confirmed. Surgically anastomosed bowel is again seen within the mid left abdomen and distal sigmoid colon. Noninflamed diverticula are again seen throughout the descending and sigmoid colon. Vascular/Lymphatic: Aortic atherosclerosis. No enlarged abdominal or pelvic lymph nodes. Reproductive: Prostate gland is mildly enlarged. Other: A right lower quadrant ostomy site is seen. No abdominopelvic ascites. Musculoskeletal: Degenerative changes are noted within the lower lumbar spine. IMPRESSION: 1. Findings which remain concerning for the presence of contained free air within the mid right abdomen and right upper quadrant, as described above. 2. Markedly inflamed, partially opacified small bowel loops within the mid and upper right abdomen. 3. Curvilinear area of oral contrast within the lateral aspect of the mid to lower right abdomen which may be within a decompressed small bowel loop, however, intraluminal location cannot completely be confirmed. 4. Stable moderate to marked severity areas of atelectasis and/or infiltrate within the bilateral lower lobes. 5. Colonic diverticulosis. 6. Aortic atherosclerosis. Electronically Signed   By: Suzen Dials M.D.   On: 05/28/2024 03:07   CT ABDOMEN PELVIS W CONTRAST Result Date: 05/27/2024 CLINICAL DATA:  Abdominal pain. EXAM: CT ABDOMEN AND PELVIS WITH CONTRAST TECHNIQUE: Multidetector CT  imaging of the abdomen and pelvis was performed using the standard protocol following bolus administration of intravenous contrast. RADIATION DOSE REDUCTION: This exam was performed according to the departmental dose-optimization program which includes automated exposure control, adjustment of the mA and/or kV according to patient size and/or use of iterative reconstruction technique. CONTRAST:  75mL OMNIPAQUE  IOHEXOL  350 MG/ML SOLN COMPARISON:  April 27, 2024 FINDINGS: Lower chest: Mild patchy right upper lobe and right middle lobe infiltrates are seen. Moderate severity areas of scarring, atelectasis and/or infiltrate are present within the bilateral lung bases. Hepatobiliary: No focal liver abnormality is seen. No gallstones, gallbladder wall thickening, or biliary dilatation. Pancreas: Unremarkable. No pancreatic ductal dilatation or surrounding inflammatory changes. Spleen: Normal in size without focal abnormality. Adrenals/Urinary Tract: Adrenal glands are unremarkable. Kidneys are normal, without renal calculi, focal lesion, or hydronephrosis. Bladder is unremarkable. Stomach/Bowel: There is mild asymmetric gastric wall thickening along the anterior aspect of the gastric antrum. Mild thickening of the duodenal bulb is also noted. These findings are seen on the prior study. A short segment of prominent (approximally 2.7 cm in diameter), air-filled proximal duodenum is seen just beyond the duodenal bulb (axial CT image 31, CT series 2/coronal reformatted image 58, CT series 5). An abrupt transition zone is seen within the region medial to the gallbladder. Several small foci of extraluminal air are also seen extending along the region adjacent to the gallbladder (axial CT images 32 through 35, CT series 2/coronal reformatted images 44 through 55, CT series 5). Markedly inflamed bowel loops are seen within the right upper quadrant and posterolateral mid right abdomen. Marked severity surrounding mesenteric  inflammatory fat stranding is seen. Surgically anastomosed bowel is again seen within the anteromedial aspect of the mid left abdomen and distal sigmoid colon. Numerous noninflamed diverticula are seen throughout the descending and sigmoid colon. Vascular/Lymphatic: Aortic atherosclerosis. No enlarged abdominal or  pelvic lymph nodes. Reproductive: The prostate gland is mildly enlarged. Other: A right lower quadrant ostomy site is seen. No abdominopelvic ascites. Musculoskeletal: Degenerative changes are noted within the lower lumbar spine. IMPRESSION: 1. Marked severity enteritis involving a short segment of proximal duodenum and numerous small bowel loops within the right upper quadrant and posterolateral mid right abdomen. 2. Right upper quadrant free air consistent with associated bowel perforation. 3. Postoperative changes consistent with history of bowel resection. 4. Colonic diverticulosis. 5. Mild, patchy right upper lobe and right middle lobe infiltrates. 6. Moderate severity bibasilar scarring, atelectasis and/or infiltrate. 7. Aortic atherosclerosis. Electronically Signed   By: Suzen Dials M.D.   On: 05/27/2024 21:42   DG Chest Port 1 View Result Date: 05/27/2024 CLINICAL DATA:  Possible sepsis EXAM: PORTABLE CHEST 1 VIEW COMPARISON:  04/26/2024, 02/01/2024 FINDINGS: Hypoventilatory changes.probable scarring at the left base. Patchy atelectasis or minimal infiltrate at the right base. Stable cardiomediastinal silhouette with aortic atherosclerosis. No pleural effusion or pneumothorax. IMPRESSION: Hypoventilatory changes with patchy atelectasis or minimal infiltrate at the right base. Electronically Signed   By: Luke Bun M.D.   On: 05/27/2024 19:48     Signature  -   Lavada Stank M.D on 05/29/2024 at 7:32 AM   -  To page go to [email or web address]

## 2024-05-29 NOTE — Progress Notes (Signed)
 Progress Note     Subjective: Pt upset over plan for strict NPO. Would like TPN in the interim. Declined abdominal exam.   Objective: Vital signs in last 24 hours: Temp:  [98 F (36.7 C)-100.4 F (38 C)] 98 F (36.7 C) (08/29 0824) Pulse Rate:  [82-104] 82 (08/29 0400) Resp:  [16-19] 19 (08/28 1718) BP: (106-118)/(59-73) 106/59 (08/29 0824) SpO2:  [97 %-100 %] 100 % (08/28 2000) Weight:  [64.4 kg] 64.4 kg (08/29 0500) Last BM Date : 05/28/24  Intake/Output from previous day: 08/28 0701 - 08/29 0700 In: 300 [P.O.:300] Out: 650 [Stool:650] Intake/Output this shift: No intake/output data recorded.  PE: General: unpleasant, WD male who is laying in bed in NAD Heart: regular, rate, and rhythm. Lungs: Respiratory effort nonlabored on RA Abd: Pt declined for me to examine his abdomen   Lab Results:  Recent Labs    05/28/24 0612 05/29/24 0422  WBC 16.6* 11.9*  HGB 9.5* 8.7*  HCT 29.7* 26.6*  PLT 333 266   BMET Recent Labs    05/28/24 0612 05/29/24 0422  NA 136 135  K 3.6 3.3*  CL 103 102  CO2 24 22  GLUCOSE 92 88  BUN 9 8  CREATININE 0.83 0.73  CALCIUM  9.1 8.8*   PT/INR Recent Labs    05/28/24 0612 05/28/24 1801  LABPROT 25.6* 20.2*  INR 2.2* 1.6*   CMP     Component Value Date/Time   NA 135 05/29/2024 0422   NA 141 01/22/2020 1137   K 3.3 (L) 05/29/2024 0422   CL 102 05/29/2024 0422   CO2 22 05/29/2024 0422   GLUCOSE 88 05/29/2024 0422   BUN 8 05/29/2024 0422   BUN 13 01/22/2020 1137   CREATININE 0.73 05/29/2024 0422   CREATININE 0.98 06/14/2017 0935   CALCIUM  8.8 (L) 05/29/2024 0422   PROT 6.3 (L) 05/29/2024 0422   PROT 7.3 01/22/2020 1137   ALBUMIN  2.1 (L) 05/29/2024 0422   ALBUMIN  4.2 01/22/2020 1137   AST 13 (L) 05/29/2024 0422   ALT 11 05/29/2024 0422   ALKPHOS 69 05/29/2024 0422   BILITOT 0.8 05/29/2024 0422   BILITOT 0.4 01/22/2020 1137   GFRNONAA >60 05/29/2024 0422   GFRNONAA >89 09/17/2014 1253   GFRAA 111 01/22/2020  1137   GFRAA >89 09/17/2014 1253   Lipase     Component Value Date/Time   LIPASE 28 04/03/2024 0200       Studies/Results: DG SMALL BOWEL W SINGLE CM (SOL OR THIN BA) Result Date: 05/28/2024 CLINICAL DATA:  [id number] Free intraperitoneal air [id number] Patient with abdominal pain, a CT abdomen with concern for free air in the mid upper abdomen. Radiology consulted for water -soluble small bowel follow-through. EXAM: SMALL BOWEL SERIES COMPARISON:  CT abdomen pelvis 05/28/24 TECHNIQUE: Following ingestion of 200 mL of water  soluble contrast (Omnipaque  300), serial small bowel images were obtained, with primary focus of the concerning area in the RUQ. FLUOROSCOPY: Radiation Exposure Index (if provided by the fluoroscopic device): 3.20 mGy Number of Acquired Spot Images: 4 FINDINGS: Scout Radiograph: Nonobstructed bowel gas pattern. Postsurgical changes with staple line projecting at the LEFT upper quadrant. Stomach: Normal appearance. No hiatus hernia. Gastric emptying: Normal. Duodenum:  Normal appearance. Other: Follow-up imaging demonstrating nondilated small bowel, intraluminal contrast at the RIGHT upper quadrant without overt extraluminal extravasation. Contrast flows out of the region of interest at the RIGHT upper quadrant into nondilated distal small bowel. IMPRESSION: 1. Nonobstructed small bowel. 2. Intraluminal contrast at the RIGHT  upper quadrant, without overt extraluminal extravasation. Contrast flows out of the region interest, into nondilated distal small bowel. Performed by: Wyatt Pommier, PA-C under supervision of Thom Hall, MD Electronically Signed   By: Thom Hall M.D.   On: 05/28/2024 16:46   DG Abd 1 View Result Date: 05/28/2024 CLINICAL DATA:  [id number] Free intraperitoneal air [id number]. EXAM: ABDOMEN - 1 VIEW COMPARISON:  04/14/2024. FINDINGS: The bowel gas pattern is non-obstructive. There is paucity of bowel gas. No evidence of pneumoperitoneum, within the limitations of a supine  film. No acute osseous abnormalities. The soft tissues are within normal limits. Surgical changes, devices, tubes and lines: None. There is excreted contrast over the urinary bladder region. Otherwise, no significant positive oral contrast noted over the bowel loops region. IMPRESSION: *Nonobstructive bowel gas pattern. No evidence of pneumoperitoneum. Electronically Signed   By: Ree Molt M.D.   On: 05/28/2024 08:46   CT ABDOMEN PELVIS WO CONTRAST Result Date: 05/28/2024 CLINICAL DATA:  Sepsis. EXAM: CT ABDOMEN AND PELVIS WITHOUT CONTRAST TECHNIQUE: Multidetector CT imaging of the abdomen and pelvis was performed following the standard protocol without IV contrast. RADIATION DOSE REDUCTION: This exam was performed according to the departmental dose-optimization program which includes automated exposure control, adjustment of the mA and/or kV according to patient size and/or use of iterative reconstruction technique. COMPARISON:  May 27, 2024 FINDINGS: Lower chest: Stable moderate to marked severity areas of atelectasis and/or infiltrate are seen within the bilateral lower lobes. Hepatobiliary: No focal liver abnormality is seen. The gallbladder is moderately distended, without gallstones, gallbladder wall thickening, or biliary dilatation. Pancreas: Unremarkable. No pancreatic ductal dilatation or surrounding inflammatory changes. Spleen: Normal in size without focal abnormality. Adrenals/Urinary Tract: Adrenal glands are unremarkable. Kidneys are normal, without renal calculi, focal lesion, or hydronephrosis. A large amount of contrast is seen within the lumen of an otherwise normal appearing urinary bladder. Stomach/Bowel: Mild, stable gastric wall thickening is seen along the anterior aspect of the gastric body. There is mild duodenal bulb thickening which is decreased in severity when compared to the prior study. The short segment of dilated proximal duodenum seen on the prior study is normal in  caliber on the current exam (axial CT images 28 through 32, CT series 3). The segment of proximal duodenum distal to this region remains thickened (axial CT image 30 through 35, CT series 3). Numerous foci of air are again seen within the mid right abdomen and right upper quadrant (axial CT images 30 through 41, CT series 3). Intraluminal location of these areas of air is not clear and remain concerning for the presence of contained free air. Markedly inflamed, partially opacified small bowel loops are again seen within this region the mid and upper right abdomen. A curvilinear area of oral contrast is seen within the lateral aspect of the mid to lower right abdomen (axial CT images 40 through 46, CT series 3/coronal reformatted images 36 through 42, CT series 6). This may be within a decompressed small bowel loop, however, intraluminal location cannot completely be confirmed. Surgically anastomosed bowel is again seen within the mid left abdomen and distal sigmoid colon. Noninflamed diverticula are again seen throughout the descending and sigmoid colon. Vascular/Lymphatic: Aortic atherosclerosis. No enlarged abdominal or pelvic lymph nodes. Reproductive: Prostate gland is mildly enlarged. Other: A right lower quadrant ostomy site is seen. No abdominopelvic ascites. Musculoskeletal: Degenerative changes are noted within the lower lumbar spine. IMPRESSION: 1. Findings which remain concerning for the presence of contained free air within  the mid right abdomen and right upper quadrant, as described above. 2. Markedly inflamed, partially opacified small bowel loops within the mid and upper right abdomen. 3. Curvilinear area of oral contrast within the lateral aspect of the mid to lower right abdomen which may be within a decompressed small bowel loop, however, intraluminal location cannot completely be confirmed. 4. Stable moderate to marked severity areas of atelectasis and/or infiltrate within the bilateral lower  lobes. 5. Colonic diverticulosis. 6. Aortic atherosclerosis. Electronically Signed   By: Suzen Dials M.D.   On: 05/28/2024 03:07   CT ABDOMEN PELVIS W CONTRAST Result Date: 05/27/2024 CLINICAL DATA:  Abdominal pain. EXAM: CT ABDOMEN AND PELVIS WITH CONTRAST TECHNIQUE: Multidetector CT imaging of the abdomen and pelvis was performed using the standard protocol following bolus administration of intravenous contrast. RADIATION DOSE REDUCTION: This exam was performed according to the departmental dose-optimization program which includes automated exposure control, adjustment of the mA and/or kV according to patient size and/or use of iterative reconstruction technique. CONTRAST:  75mL OMNIPAQUE  IOHEXOL  350 MG/ML SOLN COMPARISON:  April 27, 2024 FINDINGS: Lower chest: Mild patchy right upper lobe and right middle lobe infiltrates are seen. Moderate severity areas of scarring, atelectasis and/or infiltrate are present within the bilateral lung bases. Hepatobiliary: No focal liver abnormality is seen. No gallstones, gallbladder wall thickening, or biliary dilatation. Pancreas: Unremarkable. No pancreatic ductal dilatation or surrounding inflammatory changes. Spleen: Normal in size without focal abnormality. Adrenals/Urinary Tract: Adrenal glands are unremarkable. Kidneys are normal, without renal calculi, focal lesion, or hydronephrosis. Bladder is unremarkable. Stomach/Bowel: There is mild asymmetric gastric wall thickening along the anterior aspect of the gastric antrum. Mild thickening of the duodenal bulb is also noted. These findings are seen on the prior study. A short segment of prominent (approximally 2.7 cm in diameter), air-filled proximal duodenum is seen just beyond the duodenal bulb (axial CT image 31, CT series 2/coronal reformatted image 58, CT series 5). An abrupt transition zone is seen within the region medial to the gallbladder. Several small foci of extraluminal air are also seen extending along  the region adjacent to the gallbladder (axial CT images 32 through 35, CT series 2/coronal reformatted images 44 through 55, CT series 5). Markedly inflamed bowel loops are seen within the right upper quadrant and posterolateral mid right abdomen. Marked severity surrounding mesenteric inflammatory fat stranding is seen. Surgically anastomosed bowel is again seen within the anteromedial aspect of the mid left abdomen and distal sigmoid colon. Numerous noninflamed diverticula are seen throughout the descending and sigmoid colon. Vascular/Lymphatic: Aortic atherosclerosis. No enlarged abdominal or pelvic lymph nodes. Reproductive: The prostate gland is mildly enlarged. Other: A right lower quadrant ostomy site is seen. No abdominopelvic ascites. Musculoskeletal: Degenerative changes are noted within the lower lumbar spine. IMPRESSION: 1. Marked severity enteritis involving a short segment of proximal duodenum and numerous small bowel loops within the right upper quadrant and posterolateral mid right abdomen. 2. Right upper quadrant free air consistent with associated bowel perforation. 3. Postoperative changes consistent with history of bowel resection. 4. Colonic diverticulosis. 5. Mild, patchy right upper lobe and right middle lobe infiltrates. 6. Moderate severity bibasilar scarring, atelectasis and/or infiltrate. 7. Aortic atherosclerosis. Electronically Signed   By: Suzen Dials M.D.   On: 05/27/2024 21:42   DG Chest Port 1 View Result Date: 05/27/2024 CLINICAL DATA:  Possible sepsis EXAM: PORTABLE CHEST 1 VIEW COMPARISON:  04/26/2024, 02/01/2024 FINDINGS: Hypoventilatory changes.probable scarring at the left base. Patchy atelectasis or minimal infiltrate at the  right base. Stable cardiomediastinal silhouette with aortic atherosclerosis. No pleural effusion or pneumothorax. IMPRESSION: Hypoventilatory changes with patchy atelectasis or minimal infiltrate at the right base. Electronically Signed   By: Luke Bun M.D.   On: 05/27/2024 19:48    Anti-infectives: Anti-infectives (From admission, onward)    Start     Dose/Rate Route Frequency Ordered Stop   05/28/24 2000  piperacillin -tazobactam (ZOSYN ) IVPB 3.375 g        3.375 g 12.5 mL/hr over 240 Minutes Intravenous Every 8 hours 05/28/24 0534     05/28/24 0900  metroNIDAZOLE  (FLAGYL ) IVPB 500 mg        500 mg 100 mL/hr over 60 Minutes Intravenous  Once 05/28/24 0534 05/28/24 1104   05/28/24 0630  DAPTOmycin  (CUBICIN ) IVPB 700 mg/171mL premix        10 mg/kg  68 kg 200 mL/hr over 30 Minutes Intravenous  Once 05/28/24 9372 05/28/24 0754   05/27/24 1945  cefTRIAXone  (ROCEPHIN ) 2 g in sodium chloride  0.9 % 100 mL IVPB        2 g 200 mL/hr over 30 Minutes Intravenous Once 05/27/24 1934 05/27/24 2125   05/27/24 1945  metroNIDAZOLE  (FLAGYL ) IVPB 500 mg        500 mg 100 mL/hr over 60 Minutes Intravenous  Once 05/27/24 1934 05/27/24 2236        Assessment/Plan  37M with complex medical and surgical history, most recent surgery 04/13/24 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 Tanda Regal bowel inflammation with concern for possible perforation - known hostile abdomen - nausea and vomiting, small bowel enteritis and small volume pneumoperitoneum in the RUQ on CT 8/27 - CT 8/28 with concern for inflamed small bowel loop on the right with concern for contained perforation but also possible contrast leak vs contrast within decompressed loop of small  bowel  - SBFT 8/28 without contrast extravasation  - continue to recommend strict NPO for now, PICC and TPN ordered given albumin  of 2.1 - continue abx - recommend CT AP with PO and IV contrast Sunday - if no concern for small bowel leak then possibly consider CLD - pt remains hemodynamically stable, Tmax 100.4 (positive for URI - rhinovirus), WBC 11 23 on presentation  - hgb 8.7, continue to monitor  - pt declined abdominal exam today but no  indication for emergent surgical intervention based on the information available  FEN: NPO, PICC and TPN ordered, IVF per [facility] VTE: heparin  gtt ID: Zosyn  8/28>>  - per [facility] -  Hx of PE/DVT Pulmonary HTN PAD HTN GERD Gout  LOS: 1 day   I reviewed nursing notes, hospitalist notes, last 24 h vitals and pain scores, last 48 h intake and output, last 24 h labs and trends, and last 24 h imaging results.  This care required moderate level of medical decision making.    Burnard JONELLE Louder, [facility] Surgery 05/29/2024, 10:57 AM Please see Amion for pager number during day hours 7:00am-4:30pm

## 2024-05-29 NOTE — Consult Note (Signed)
 WOC Nurse ostomy consult note Stoma type/location: RLQ ileostomy, recent revision.  Located in crease at 3:00 that leads to open midline abdominal wound.  Patient was unable to keep seal on any Hollister pouch (on formulary) and we introduced Coloplast 2 piece flexible pouch in the outpatient ostomy clinic and that has held since Tuesday (intact on third day).  He has called the clinic, his sister has called and patient has demanded to be seen by an ostomy nurse.   *patient was seen 05/26/24 in outpatient clinic and he was told that these pouches were not on formulary and if admitted to hospital, he would have to wear formulary pouches or bring his pouches from home.  He has not brought pouches with him (was provided 3 additional pouches in clinic while waiting on [employer] order).  He is Production designer, theatre/television/film and the inpatient staff do not have access to this style of pouch. I will provide coloplast pouches from clinic stock, but remind patient that we will not have acces going forward.  He verbalizes understanding and states his [employer] supplies are scheduled to arrive today.  His sister is aware and is picking them up.  Stomal assessment/size: 1  flush pink and moist  productive of liquid green stool  NPO currently.  Peristomal assessment: creasing at 3:00 that leads to midline abdominal wound.   Tuesday, skin was denuded and weeping.  TOday is pink and intact.  Greatly improved.  Stoma powder and skin prep.  Piece of barrier ring in creasing at 3 and 9o'clock..  Large Eakin seal around stoma to promote seal.  (Can transition back to 2 barrier ring once skin improved)  2 piece flexible pouch, secured with barrier strips and ostomy belt.  Treatment options for stomal/peristomal skin: See above I provided 3 additional pouches and barrier seals, an additional belt and barrier strips in bag at bedside for the weekend.  Output liquid green stool Ostomy pouching: 2pc. Flexible, this pouch can be folded  in half and conforms to skin and creasing.  The rigid plastic ring on Hollister flexible dislodges when patient moves, even with belt.     Education provided: See above.  Enrolled patient in [employer] DC program: Yes  but using Coloplast. At this time.  I ask secretary to order stoma powder, skin prep, adhesive remover and barrier rings from supply stock.  Will not follow at this time.  Please re-consult if needed.  Darice Cooley MSN, RN, FNP-BC CWON Wound, Ostomy, Continence Nurse Outpatient [facility] [phone] Work cell phone:  [phone]

## 2024-05-29 NOTE — Progress Notes (Signed)
 PHARMACY - ANTICOAGULATION CONSULT NOTE  Pharmacy Consult for heparin  Indication: h/o VTE  Labs: Recent Labs    05/27/24 2000 05/28/24 0612 05/28/24 1801 05/29/24 0422  HGB 11.1* 9.5*  --  8.7*  HCT 35.3* 29.7*  --  26.6*  PLT 438* 333  --  266  LABPROT 22.0* 25.6* 20.2*  --   INR 1.8* 2.2* 1.6*  --   HEPARINUNFRC  --   --   --  <0.10*  CREATININE 0.89 0.83  --   --    Assessment: 63yo male subtherapeutic on heparin  with initial dosing while warfarin on hold; last month pt required heparin  rate > 2000 units/hr for therapeutic levels; no infusion issues or signs of bleeding per RN.  Goal of Therapy:  Heparin  level 0.3-0.7 units/ml   Plan:  Increase heparin  infusion fairly aggressively to 1500 units/hr. Check level in 6 hours.   Marvetta Dauphin, PharmD, BCPS 05/29/2024 5:25 AM

## 2024-05-29 NOTE — Progress Notes (Addendum)
 PHARMACY - TOTAL PARENTERAL NUTRITION CONSULT NOTE   Indication: not tolerating enteral feeds  Patient Measurements: Height: 5' 9 (175.3 cm) Weight: 64.4 kg (141 lb 15.6 oz) IBW/kg (Calculated) : 70.7 TPN AdjBW (KG): 68 Body mass index is 20.97 kg/m. Usual Weight: patient states his normal weight is 150 pounds / our scale is incorrect  Assessment: 63 YOM in need of TPN d/t inability to tolerate enteral feeds w/ recent history of N/V/D  Glucose / Insulin : CBG's <100, no prior history of DM. HgbA1C 5.9% 12/03/2023 Electrolytes: Na 135, K 3.3, Ch 102, Ca 8.8 [CoCa 10.32], Phos 3.7, Mag 1.6, CO2 22 Renal: Scr 0.73, BUN 8 Hepatic: Alk Phos wnl, AST 13, ALT wnl, Alb 2.1 Intake / Output; MIVF: LR w/ 40 meq K @ 75 ml/hr GI Imaging: GI Surgeries / Procedures:   Central access: 05/29/2024 TPN start date: 05/29/2024  Nutritional Goals: Goal TPN rate is 85 mL/hr (provides 110 g of protein and 2155 kcals per day)  RD Assessment: Estimated Needs Total Energy Estimated Needs: 2100-2300 Total Protein Estimated Needs: 105-125 grams Total Fluid Estimated Needs: >/= 2 L  Current Nutrition:  NPO and TPN  Plan:  Start TPN at 18mL/hr at 1800. This will provide ~45% of nutritional needs Electrolytes in TPN: Na 60 mEq/L, K 75 mEq/L, Ca 10 mEq/L, Mg 10 mEq/L, and Phos 20 mmol/L. Cl:Ac 1:1 Add standard MVI and trace elements to TPN Thiamine  100 mg iv qday x5 days Mag 4 grams iv X1 today KCL 10 meq iv x2 Initiate Sensitive q4h SSI and adjust as needed  Reduce MIVF to 35 mL/hr at 1800 Monitor TPN labs on Mon/Thurs, daily x3  Stony Stegmann BS, PharmD, BCPS Clinical Pharmacist 05/29/2024 11:25 AM  Contact: [phone] after 3 PM

## 2024-05-29 NOTE — Progress Notes (Signed)
 Initial Nutrition Assessment  DOCUMENTATION CODES:  Not applicable  INTERVENTION:  TPN to meet 100% of pt estimated needs  Management per pharmacy Monitor magnesium , potassium, and phosphorus daily for at least 3 days, MD to replete as needed.  NUTRITION DIAGNOSIS:  Inadequate oral intake related to inability to eat as evidenced by NPO status.  GOAL:  Patient will meet greater than or equal to 90% of their needs  MONITOR:  Labs, I & O's, Weight trends, Diet advancement  REASON FOR ASSESSMENT:  Consult New TPN/TNA  ASSESSMENT:  63 y.o. male presented to the ED with abdominal pain, nausea and vomiting. Pt recently admitted in July 2025 and underwent abdominal surgery with ileostomy creation. PMH includes GERD, HTN, gout, and diverticulitis. Pt admitted with sepsis due to intraabdominal infection vs possible perforation.   8/28 - Admitted; strict NPO per surgery recommendation  Pt known to RD team from previous admissions. Pt was not interested in talking to RD due to being hungry and wanting to eat. Pt questioning why TPN could not be started now, RD explained that all pt TPN are hung at the same time across the system.  Pt at refeeding risk given suspected malnutrition and unknown PO intake prior to admission.   Per chart review, pt with a 22.6% weight loss since October 2024, this is clinically significant and severe for timeframe. Suspect that pt meets criteria for malnutrition, but unable to diagnosis at this time due to unable to complete physical exam.   Nutrition Related Medications: NovoLog  0-6 units q4h, Protonix , Thiamine ,  IV magnesium  sulfate, IV antibiotics  Labs: Sodium 135, Potassium 3.3, BUN 8, Creatinine 0.73, 3.7, Magnesium  1.6, Vitamin B12 387 (04/29/24)   NUTRITION - FOCUSED PHYSICAL EXAM: Deferred to follow-up.   Diet Order:   Diet Order             Diet NPO time specified  Diet effective now                  EDUCATION NEEDS:  No education needs  have been identified at this time  Skin:  Skin Assessment: Reviewed RN Assessment  Last BM:  8/28 - 650 mL via ileostomy   Height:  Ht Readings from Last 1 Encounters:  05/27/24 5' 9 (1.753 m)   Weight:  Wt Readings from Last 1 Encounters:  05/29/24 64.4 kg   Ideal Body Weight:  72.7 kg  BMI:  Body mass index is 20.97 kg/m.  Estimated Nutritional Needs:  Kcal:  2100-2300 Protein:  105-125 grams Fluid:  >/= 2 L   Nestora Glatter RD, LDN Clinical Dietitian

## 2024-05-30 DIAGNOSIS — K668 Other specified disorders of peritoneum: Secondary | ICD-10-CM | POA: Diagnosis not present

## 2024-05-30 LAB — COMPREHENSIVE METABOLIC PANEL WITH GFR
ALT: 12 U/L (ref 0–44)
AST: 19 U/L (ref 15–41)
Albumin: 2 g/dL — ABNORMAL LOW (ref 3.5–5.0)
Alkaline Phosphatase: 54 U/L (ref 38–126)
Anion gap: 8 (ref 5–15)
BUN: 8 mg/dL (ref 8–23)
CO2: 24 mmol/L (ref 22–32)
Calcium: 8.3 mg/dL — ABNORMAL LOW (ref 8.9–10.3)
Chloride: 101 mmol/L (ref 98–111)
Creatinine, Ser: 0.65 mg/dL (ref 0.61–1.24)
GFR, Estimated: >60 mL/min (ref >60)
Glucose, Bld: 174 mg/dL — ABNORMAL HIGH (ref 70–99)
Potassium: 3.2 mmol/L — ABNORMAL LOW (ref 3.5–5.1)
Sodium: 133 mmol/L — ABNORMAL LOW (ref 135–145)
Total Bilirubin: 0.3 mg/dL (ref 0.0–1.2)
Total Protein: 6 g/dL — ABNORMAL LOW (ref 6.5–8.1)

## 2024-05-30 LAB — CBC WITH DIFFERENTIAL/PLATELET
Abs Immature Granulocytes: 0.05 K/uL (ref 0.00–0.07)
Basophils Absolute: 0 K/uL (ref 0.0–0.1)
Basophils Relative: 0 %
Eosinophils Absolute: 0.1 K/uL (ref 0.0–0.5)
Eosinophils Relative: 1 %
HCT: 24.4 % — ABNORMAL LOW (ref 39.0–52.0)
Hemoglobin: 7.7 g/dL — ABNORMAL LOW (ref 13.0–17.0)
Immature Granulocytes: 1 %
Lymphocytes Relative: 8 %
Lymphs Abs: 0.6 K/uL — ABNORMAL LOW (ref 0.7–4.0)
MCH: 30.1 pg (ref 26.0–34.0)
MCHC: 31.6 g/dL (ref 30.0–36.0)
MCV: 95.3 fL (ref 80.0–100.0)
Monocytes Absolute: 0.3 K/uL (ref 0.1–1.0)
Monocytes Relative: 4 %
Neutro Abs: 6.6 K/uL (ref 1.7–7.7)
Neutrophils Relative %: 86 %
Platelets: 250 K/uL (ref 150–400)
RBC: 2.56 MIL/uL — ABNORMAL LOW (ref 4.22–5.81)
RDW: 16.1 % — ABNORMAL HIGH (ref 11.5–15.5)
WBC: 7.6 K/uL (ref 4.0–10.5)
nRBC: 0 % (ref 0.0–0.2)

## 2024-05-30 LAB — HEPARIN LEVEL (UNFRACTIONATED)
Heparin Unfractionated: 0.15 [IU]/mL — ABNORMAL LOW (ref 0.30–0.70)
Heparin Unfractionated: 0.34 [IU]/mL (ref 0.30–0.70)
Heparin Unfractionated: 0.46 [IU]/mL (ref 0.30–0.70)

## 2024-05-30 LAB — C-REACTIVE PROTEIN: CRP: 20.6 mg/dL — ABNORMAL HIGH (ref <1.0)

## 2024-05-30 LAB — MAGNESIUM: Magnesium: 1.8 mg/dL (ref 1.7–2.4)

## 2024-05-30 LAB — PHOSPHORUS: Phosphorus: 2.9 mg/dL (ref 2.5–4.6)

## 2024-05-30 LAB — PROTIME-INR
INR: 1.2 (ref 0.8–1.2)
Prothrombin Time: 15.8 s — ABNORMAL HIGH (ref 11.4–15.2)

## 2024-05-30 LAB — PROCALCITONIN: Procalcitonin: 2.05 ng/mL

## 2024-05-30 MED ORDER — TRAVASOL 10 % IV SOLN
INTRAVENOUS | Status: AC
  Filled 2024-05-30: qty 777.6

## 2024-05-30 MED ORDER — POTASSIUM CHLORIDE 10 MEQ/100ML IV SOLN
10.0000 meq | INTRAVENOUS | Status: AC
  Administered 2024-05-30 (×4): 10 meq via INTRAVENOUS
  Filled 2024-05-30: qty 100

## 2024-05-30 MED ORDER — MAGNESIUM SULFATE 2 GM/50ML IV SOLN
2.0000 g | Freq: Once | INTRAVENOUS | Status: AC
  Administered 2024-05-30: 2 g via INTRAVENOUS
  Filled 2024-05-30: qty 50

## 2024-05-30 NOTE — Progress Notes (Signed)
 PHARMACY - ANTICOAGULATION CONSULT NOTE  Pharmacy Consult:  Heparin  Indication: VTE treatment (warfarin PTA on hold)  No Known Allergies  Patient Measurements: Height: 5' 9 (175.3 cm) Weight: 64 kg (141 lb 1.5 oz) IBW/kg (Calculated) : 70.7 HEPARIN  DW (KG): 68  Vital Signs: Temp: 98.7 F (37.1 C) (08/30 1630) Temp Source: Oral (08/30 1630) BP: 124/51 (08/30 1630) Pulse Rate: 69 (08/30 1630)  Labs: Recent Labs    05/28/24 0612 05/28/24 1801 05/29/24 0422 05/29/24 1703 05/30/24 0055 05/30/24 0943 05/30/24 1816  HGB 9.5*  --  8.7*  --  7.7*  --   --   HCT 29.7*  --  26.6*  --  24.4*  --   --   PLT 333  --  266  --  250  --   --   LABPROT 25.6* 20.2*  --   --  15.8*  --   --   INR 2.2* 1.6*  --   --  1.2  --   --   HEPARINUNFRC  --   --  <0.10*   < > 0.15* 0.34 0.46  CREATININE 0.83  --  0.73  --  0.65  --   --    < > = values in this interval not displayed.    Estimated Creatinine Clearance: 86.7 mL/min (by C-G formula based on SCr of 0.65 mg/dL).  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 Pharmacy consulted for heparin  gtt for bridge while patient undergoes evaluation for possible procedure.   Heparin  level remains therapeutic (0.46) on infusion at 2200 units/hr. No bleeding noted.  Goal of Therapy:  Heparin  level 0.3-0.7 units/ml Monitor platelets by anticoagulation protocol: Yes   Plan:  Continue heparin  infusion at 2200 units/hr Daily heparin  level and CBC Monitor for signs and symptoms of bleeding   Vito Ralph, PharmD, BCPS Please see amion for complete clinical pharmacist phone list 05/30/2024 7:03 PM

## 2024-05-30 NOTE — Plan of Care (Signed)

## 2024-05-30 NOTE — Evaluation (Signed)
 Physical Therapy Evaluation Patient Details Name: Alexander [name] MRN: [id number] DOB: 12-May-1961 Today's Date: 05/30/2024  History of Present Illness  The pt is a 63 yo male presenting 8/27 from [facility] with n/v and lethargy. Work up revealed pneumoperitoneum and sepsis, likely due to intraabdominal infection. Pt with extensive hx of abdominal surgery including  emergent laparotomy and right hemicolectomy (4/25), returned to the OR twice for additional SBR and eventual end ileostomy with abdominal closure, returned to OR for ex-lap with sigmoid colectomy, ileocolonic anastomosis, and creation of a loop ileostomy after wound dehiscence (7/25). Additional PMH includes: PE, DVT, HTN, and pulmonary HTN.   Clinical Impression  Pt in bed upon arrival of PT, and initially agreeable to evaluation. Prior to admission the pt reports he was mobilizing with RW or WC at facility, working with PT and OT to improve independence and endurance. The pt presents with moderate strength in BLE with good ROM and no reports of pain or changes in sensation. He does demo poor strength with BUE and limited ROM, but possibly due to lack of effort with testing due to presence of IVs. Despite initially agreeing to participate in PT evaluation as long as he didn't have to get out of bed, once the pt answered questions about PLOF and completed MMT, he refused all further mobility (sitting EOB or rolling in bed) until after he is put back on regular diet (pt currently NPO). Attempted to explain importance of mobility to maintain strength and assist with digestion and pt became agitated. Will follow and evaluate further if pt changes his mind about mobilizing before diet changed or when diet is changed. Anticipate will need to return to skilled rehab <3hours/day.          If plan is discharge home, recommend the following: Two people to help with walking and/or transfers;Two people to help with bathing/dressing/bathroom;Direct  supervision/assist for medications management;Direct supervision/assist for financial management;Help with stairs or ramp for entrance   Can travel by private vehicle   No    Equipment Recommendations Wheelchair (measurements PT);Wheelchair cushion (measurements PT)  Recommendations for Other Services       Functional Status Assessment Patient has had a recent decline in their functional status and demonstrates the ability to make significant improvements in function in a reasonable and predictable amount of time.     Precautions / Restrictions Precautions Precautions: Fall;Other (comment) Recall of Precautions/Restrictions: Impaired Precaution/Restrictions Comments: abdominal, ileostomy Restrictions Weight Bearing Restrictions Per Provider Order: No      Mobility  Bed Mobility Overal bed mobility: Needs Assistance Bed Mobility: Rolling, Supine to Sit Rolling: Mod assist, Used rails   Supine to sit: Total assist, HOB elevated     General bed mobility comments: modA to complete partial roll for dressing change, dependent on HOB to sit up more in bed. pt initially agreeable to mobility as long as he didnt get out of bed, then later refused to sit EOB    Transfers                   General transfer comment: pt refused       Balance Overall balance assessment:  (unable to assess)                                           Pertinent Vitals/Pain Pain Assessment Pain Assessment: Faces Pain Score: 6  Faces  Pain Scale: Hurts even more Pain Location: abdomen Pain Descriptors / Indicators: Discomfort Pain Intervention(s): Limited activity within patient's tolerance, Monitored during session, Repositioned    Home Living Family/patient expects to be discharged to:: Assisted living                 Home Equipment: Agricultural consultant (2 wheels);Cane - single point;Wheelchair - manual Additional Comments: pt reports using RW and WC at [facility], still working with therapies there    Prior Function Prior Level of Function : Needs assist             Mobility Comments: pt reports independence, but also that he is still working with therapies, using RW or WC ADLs Comments: pt reports assist from staff     Extremity/Trunk Assessment   Upper Extremity Assessment Upper Extremity Assessment: Generalized weakness (possibly due to lack of effort)    Lower Extremity Assessment Lower Extremity Assessment: Overall WFL for tasks assessed (good strength to MMT when pt participating, full ROM, denies difference in sensation)    Cervical / Trunk Assessment Cervical / Trunk Assessment: Other exceptions Cervical / Trunk Exceptions: abdominal surgery, open wound on abdomen with dressing changed by RN  Communication   Communication Communication: No apparent difficulties    Cognition Arousal: Alert Behavior During Therapy: Agitated   PT - Cognitive impairments: Difficult to assess                       PT - Cognition Comments: difficult to fully assess due to pt's agitation with questions and history-taking. Pt with limited responses, intermittent conflicting responses Following commands: Intact (when pt agreeable)       Cueing Cueing Techniques: Verbal cues     General Comments General comments (skin integrity, edema, etc.): VSS, abdominal dressing changed by RN    Exercises General Exercises - Lower Extremity Ankle Circles/Pumps: AROM, Both, 5 reps Quad Sets: AROM, Both, 5 reps Heel Slides: AROM, Both, 5 reps Hip ABduction/ADduction: AROM, Both, 5 reps   Assessment/Plan    PT Assessment Patient needs continued PT services  PT Problem List Decreased strength;Decreased balance;Decreased activity tolerance;Decreased mobility       PT Treatment Interventions DME instruction;Gait training;Stair training;Functional mobility training;Therapeutic activities;Therapeutic exercise;Balance  training;Patient/family education    PT Goals (Current goals can be found in the Care Plan section)  Acute Rehab PT Goals Patient Stated Goal: to reduce pain and eat real food PT Goal Formulation: With patient Time For Goal Achievement: 06/13/24 Potential to Achieve Goals: Poor    Frequency Min 1X/week        AM-PAC PT 6 Clicks Mobility  Outcome Measure Help needed turning from your back to your side while in a flat bed without using bedrails?: A Lot Help needed moving from lying on your back to sitting on the side of a flat bed without using bedrails?: A Lot Help needed moving to and from a bed to a chair (including a wheelchair)?: Total Help needed standing up from a chair using your arms (e.g., wheelchair or bedside chair)?: Total Help needed to walk in hospital room?: Total Help needed climbing 3-5 steps with a railing? : Total 6 Click Score: 8    End of Session   Activity Tolerance: Patient limited by pain;Treatment limited secondary to agitation (pt refusal) Patient left: in bed;with call bell/phone within reach Nurse Communication: Mobility status PT Visit Diagnosis: Unsteadiness on feet (R26.81);Muscle weakness (generalized) (M62.81)    Time: 8894-8879 PT Time  Calculation (min) (ACUTE ONLY): 15 min   Charges:   PT Evaluation $PT Eval Low Complexity: 1 Low   PT General Charges $$ ACUTE PT VISIT: 1 Visit         Izetta Call, PT, DPT   Acute Rehabilitation Department Office [phone] Secure Chat Communication Preferred  [name] 05/30/2024, 1:19 PM

## 2024-05-30 NOTE — Progress Notes (Signed)
 PT Cancellation Note  Patient Details Name: Alexander [name] MRN: [id number] DOB: [birth year]   Cancelled Treatment:    Reason Eval/Treat Not Completed: Patient declined, no reason specified initially, with further questioning pt reports he is too tired and in too much pain despite premedication. Asked PT to return later in the morning. Will continue to make attempts as time/schedule allow.   Izetta Call, PT, DPT   Acute Rehabilitation Department Office [phone] Secure Chat Communication Preferred   [name] 05/30/2024, 8:41 AM

## 2024-05-30 NOTE — Progress Notes (Signed)
 PROGRESS NOTE                                                                                                                                                                                                             Patient Demographics:    Alexander [name], is a 63 y.o. male, DOB - April 22, 1961, FMW:[id number]  Outpatient Primary MD for the patient is [name], [name]    LOS - 2  Admit date - 05/27/2024    Chief Complaint  Patient presents with   Abdominal Pain   Nausea   Weakness   Emesis       Brief Narrative (HPI from H&P)   63 y.o. 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 Presented with acute onset of N/V, abd pain, imaging was suggestive of some free air in the abdomen which probably contained perforation, seen by general surgery and admitted to the hospital.   Subjective:   Patient in bed, appears comfortable, denies any headache, no fever, no chest pain or pressure, no shortness of breath , no abdominal pain. No new focal weakness.   Assessment  & Plan :   Sepsis, nausea vomiting, secondary to possible contained intra-abdominal perforation.  Patient with history of multiple abdominal surgeries and recent  intra-abdominal abscess, has ileostomy.      He has been seen by general surgery, he has complicated history of multiple abdominal surgeries, recent intra-abdominal abscess, ileostomy.  There is suspicion of contained perforation, he has been treated conservatively with bowel rest, IV fluids, pain control and antibiotics.  Will defer management of this issue to general surgery who is following the patient.    Note patient has chronic pain and is on high doses of narcotics at home, use with caution as blood pressure borderline low.  Sepsis  pathophysiology is improving, follow cultures.   Prolonged Qtc  Avoid QT prolonging medications, replace electrolytes, monitor on telemetry.   Hypomagnesemia and hypokalemia.  Replaced.    Chronic medical problems: Holding all meds with strict NPO for now   Complex Surgical Hx per above, noted   DVT/PE: Hold Warfarin in case requires surgery. INR < 2 at present. Trend daily. Continue  Heparin  drip.  Diastolic HF: Without exacerbation, dry clinically.   HTN: not on antiHTN at time of admission   Anemia: Recent baseline ~ 8, hemoconcentrated on admission anticipate will drop towards 8. Holding iron , type screen and monitor.  Chronic pain: Home regimen is Dilaudid  2 mg PO q 4 hr prn, see acute pain management above. Holding home Gabapentin .   ?insomnia/mood: holding trazodone    ?? ASCVD: Hold aspirin ; unclear indication since on Warfarin per above      Condition - Extremely Guarded  Family Communication  : None present  Code Status : Full code  Consults  : General surgery  PUD Prophylaxis : PPI   Procedures  :     Small bowel follow-through 05/28/2024.  No extravasation of contrast.    CT 05/28/2024- 1. Findings which remain concerning for the presence of contained free air within the mid right abdomen and right upper quadrant, as described above. 2. Markedly inflamed, partially opacified small bowel loops within the mid and upper right  abdomen. 3. Curvilinear area of oral contrast within the lateral aspect of the mid to lower right abdomen which may be within a decompressed small bowel loop, however, intraluminal location cannot completely be confirmed. 4. Stable moderate to marked severity areas of atelectasis and/or infiltrate within the bilateral lower lobes. 5. Colonic diverticulosis. 6. Aortic atherosclerosis.      Disposition Plan  :    Status is: Inpatient  DVT Prophylaxis  :  Hep gtt  SCDs Start: 05/28/24 0530    Lab Results  Component Value Date   PLT 250 05/30/2024    Diet :  Diet Order             Diet NPO time specified  Diet effective now                    Inpatient Medications  Scheduled Meds:  Chlorhexidine  Gluconate Cloth  6 each Topical Daily   insulin  aspart  0-6 Units Subcutaneous Q4H   pantoprazole  (PROTONIX ) IV  40 mg Intravenous Q12H   sodium chloride  flush  10-40 mL Intracatheter Q12H   sodium chloride  flush  3 mL Intravenous Q12H   thiamine  (VITAMIN B1) injection  100 mg Intravenous Daily   Continuous Infusions:  heparin  2,200 Units/hr (05/30/24 0247)   lactated ringers  50 mL/hr at 05/29/24 1702   linezolid  (ZYVOX ) IV 600 mg (05/30/24 0032)   piperacillin -tazobactam (ZOSYN )  IV 3.375 g (05/30/24 0422)   potassium chloride      TPN ADULT (ION) 40 mL/hr at 05/29/24 1758   PRN Meds:.acetaminophen , albuterol , morphine  injection **OR** morphine  injection, sodium chloride  flush, trimethobenzamide     Objective:   Vitals:   05/29/24 2332 05/30/24 0431 05/30/24 0500 05/30/24 0826  BP: (!) 102/51   128/64  Pulse: 79 66  64  Resp: 15   16  Temp: 97.6 F (36.4 C) 98.7 F (37.1 C)  98.2 F (36.8 C)  TempSrc: Oral Oral  Oral  SpO2:  98%    Weight:   64 kg   Height:        Wt Readings from  Last 3 Encounters:  05/30/24 64 kg  04/29/24 55 kg  04/06/24 71 kg     Intake/Output Summary (Last 24 hours) at 05/30/2024 0903 Last data filed at 05/30/2024 0519 Gross per 24  hour  Intake 1369.17 ml  Output 200 ml  Net 1169.17 ml     Physical Exam  Awake Alert, No new F.N deficits, Normal affect [place].AT,PERRAL Supple Neck, No JVD,   Symmetrical Chest wall movement, Good air movement bilaterally, CTAB RRR,No Gallops,Rubs or new Murmurs,  +ve B.Sounds, Abd Soft, ileostomy present, tenderness throughout the abdomen mostly in the upper quadrants No Cyanosis, Clubbing or edema        Data Review:    Recent Labs  Lab 05/27/24 2000 05/28/24 0612 05/29/24 0422 05/30/24 0055  WBC 23.4* 16.6* 11.9* 7.6  HGB 11.1* 9.5* 8.7* 7.7*  HCT 35.3* 29.7* 26.6* 24.4*  PLT 438* 333 266 250  MCV 95.7 94.6 92.4 95.3  MCH 30.1 30.3 30.2 30.1  MCHC 31.4 32.0 32.7 31.6  RDW 16.7* 16.7* 16.3* 16.1*  LYMPHSABS 1.4  --   --  0.6*  MONOABS 1.4*  --   --  0.3  EOSABS 0.0  --   --  0.1  BASOSABS 0.1  --   --  0.0    Recent Labs  Lab 05/27/24 2000 05/27/24 2003 05/27/24 2159 05/28/24 0612 05/28/24 1801 05/29/24 0422 05/30/24 0055  NA 136  --   --  136  --  135 133*  K 4.1  --   --  3.6  --  3.3* 3.2*  CL 105  --   --  103  --  102 101  CO2 20*  --   --  24  --  22 24  ANIONGAP 11  --   --  9  --  11 8  GLUCOSE 120*  --   --  92  --  88 174*  BUN 12  --   --  9  --  8 8  CREATININE 0.89  --   --  0.83  --  0.73 0.65  AST 19  --   --   --   --  13* 19  ALT 17  --   --   --   --  11 12  ALKPHOS 88  --   --   --   --  69 54  BILITOT 0.8  --   --   --   --  0.8 0.3  ALBUMIN  3.1*  --   --   --   --  2.1* 2.0*  CRP  --   --   --   --   --   --  20.6*  PROCALCITON  --   --   --   --   --  3.23 2.05  LATICACIDVEN  --  1.3 1.6 1.4  --   --   --   INR 1.8*  --   --  2.2* 1.6*  --  1.2  MG  --   --   --  1.2*  --  1.6* 1.8  PHOS  --   --   --  3.3  --  3.7 2.9  CALCIUM  9.6  --   --  9.1  --  8.8* 8.3*      Recent Labs  Lab 05/27/24 2000 05/27/24 2003 05/27/24 2159 05/28/24 0612 05/28/24 1801 05/29/24 0422 05/30/24 0055  CRP  --   --   --   --   --   --  20.6*  PROCALCITON  --   --   --   --   --  3.23 2.05  LATICACIDVEN  --  1.3 1.6 1.4  --   --   --   INR 1.8*  --   --  2.2* 1.6*  --  1.2  MG  --   --   --  1.2*  --  1.6* 1.8  CALCIUM  9.6  --   --  9.1  --  8.8* 8.3*    --------------------------------------------------------------------------------------------------------------- Lab Results  Component Value Date   CHOL 114 12/03/2023   HDL 43.40 12/03/2023   LDLCALC 58 12/03/2023   TRIG 48 02/03/2024   CHOLHDL 3 12/03/2023    Lab Results  Component Value Date   HGBA1C 5.9 12/03/2023   No results for input(s): TSH, T4TOTAL, FREET4, T3FREE, THYROIDAB in the last 72 hours. No results for input(s): VITAMINB12, FOLATE, FERRITIN, TIBC, IRON , RETICCTPCT in the last 72 hours. ------------------------------------------------------------------------------------------------------------------ Cardiac Enzymes No results for input(s): CKMB, TROPONINI, MYOGLOBIN in the last 168 hours.  Invalid input(s): CK  Micro Results Recent Results (from the past 240 hours)  Resp panel by RT-PCR (RSV, Flu A&B, Covid) Anterior Nasal Swab     Status: None   Collection Time: 05/27/24  7:49 PM   Specimen: Anterior Nasal Swab  Result Value Ref Range Status   SARS Coronavirus 2 by RT PCR NEGATIVE NEGATIVE Final   Influenza A by PCR NEGATIVE NEGATIVE Final   Influenza B by PCR NEGATIVE NEGATIVE Final    Comment: (NOTE) The Xpert Xpress SARS-CoV-2/FLU/RSV plus assay is intended as an aid in the diagnosis of influenza from Nasopharyngeal swab specimens and should not be used as a sole basis for treatment. Nasal washings and aspirates are unacceptable for Xpert Xpress SARS-CoV-2/FLU/RSV testing.  Fact Sheet for Patients: BloggerCourse.com  Fact Sheet for Healthcare Providers: SeriousBroker.it  This test is not yet approved or cleared by the [place]  FDA and has been  authorized for detection and/or diagnosis of SARS-CoV-2 by FDA under an Emergency Use Authorization (EUA). This EUA will remain in effect (meaning this test can be used) for the duration of the COVID-19 declaration under Section 564(b)(1) of the Act, 21 U.S.C. section 360bbb-3(b)(1), unless the authorization is terminated or revoked.     Resp Syncytial Virus by PCR NEGATIVE NEGATIVE Final    Comment: (NOTE) Fact Sheet for Patients: BloggerCourse.com  Fact Sheet for Healthcare Providers: SeriousBroker.it  This test is not yet approved or cleared by the [place]  FDA and has been authorized for detection and/or diagnosis of SARS-CoV-2 by FDA under an Emergency Use Authorization (EUA). This EUA will remain in effect (meaning this test can be used) for the duration of the COVID-19 declaration under Section 564(b)(1) of the Act, 21 U.S.C. section 360bbb-3(b)(1), unless the authorization is terminated or revoked.  Performed at [facility], [address] [place], [place] [address]   Blood Culture (routine x 2)     Status: None (Preliminary result)   Collection Time: 05/27/24  8:00 PM   Specimen: BLOOD LEFT ARM  Result Value Ref Range Status   Specimen Description BLOOD LEFT ARM  Final   Special Requests   Final    BOTTLES DRAWN AEROBIC AND ANAEROBIC Blood Culture adequate volume   Culture   Final    NO GROWTH 3 DAYS Performed at [facility], [address], [place], [place] [address]    Report Status PENDING  Incomplete  Blood Culture (  routine x 2)     Status: None (Preliminary result)   Collection Time: 05/27/24  8:44 PM   Specimen: BLOOD RIGHT ARM  Result Value Ref Range Status   Specimen Description BLOOD RIGHT ARM  Final   Special Requests   Final    BOTTLES DRAWN AEROBIC AND ANAEROBIC Blood Culture adequate volume   Culture   Final    NO GROWTH 3 DAYS Performed at [facility], [address] [place], [place] [address]    Report Status PENDING  Incomplete  Respiratory (~20 pathogens) panel by PCR     Status: Abnormal   Collection Time: 05/28/24  8:58 AM   Specimen: Nasopharyngeal Swab; Respiratory  Result Value Ref Range Status   Adenovirus NOT DETECTED NOT DETECTED Final   Coronavirus 229E NOT DETECTED NOT DETECTED Final    Comment: (NOTE) The Coronavirus on the Respiratory Panel, DOES NOT test for the novel  Coronavirus (2019 nCoV)    Coronavirus HKU1 NOT DETECTED NOT DETECTED Final   Coronavirus NL63 NOT DETECTED NOT DETECTED Final   Coronavirus OC43 NOT DETECTED NOT DETECTED Final   Metapneumovirus NOT DETECTED NOT DETECTED Final   Rhinovirus / Enterovirus DETECTED (A) NOT DETECTED Final   Influenza A NOT DETECTED NOT DETECTED Final   Influenza B NOT DETECTED NOT DETECTED Final   Parainfluenza Virus 1 NOT DETECTED NOT DETECTED Final   Parainfluenza Virus 2 NOT DETECTED NOT DETECTED Final   Parainfluenza Virus 3 NOT DETECTED NOT DETECTED Final   Parainfluenza Virus 4 NOT DETECTED NOT DETECTED Final   Respiratory Syncytial Virus NOT DETECTED NOT DETECTED Final   Bordetella pertussis NOT DETECTED NOT DETECTED Final   Bordetella Parapertussis NOT DETECTED NOT DETECTED Final   Chlamydophila pneumoniae NOT DETECTED NOT DETECTED Final   Mycoplasma pneumoniae NOT DETECTED NOT DETECTED Final    Comment: Performed at [facility], [address] [place], [place] [address]    Radiology Report US  EKG SITE RITE Result Date: 05/29/2024 If Site Rite image not attached, placement could not be confirmed due to current cardiac rhythm.  DG SMALL BOWEL W SINGLE CM (SOL OR THIN BA) Result Date: 05/28/2024 CLINICAL DATA:  [id number] Free intraperitoneal air [phone] Patient with abdominal pain, a CT abdomen with concern for free air in the mid upper abdomen. Radiology consulted for water -soluble small bowel follow-through. EXAM: SMALL BOWEL SERIES COMPARISON:  CT abdomen pelvis 05/28/24  TECHNIQUE: Following ingestion of 200 mL of water  soluble contrast (Omnipaque  300), serial small bowel images were obtained, with primary focus of the concerning area in the RUQ. FLUOROSCOPY: Radiation Exposure Index (if provided by the fluoroscopic device): 3.20 mGy Number of Acquired Spot Images: 4 FINDINGS: Scout Radiograph: Nonobstructed bowel gas pattern. Postsurgical changes with staple line projecting at the LEFT upper quadrant. Stomach: Normal appearance. No hiatus hernia. Gastric emptying: Normal. Duodenum:  Normal appearance. Other: Follow-up imaging demonstrating nondilated small bowel, intraluminal contrast at the RIGHT upper quadrant without overt extraluminal extravasation. Contrast flows out of the region of interest at the RIGHT upper quadrant into nondilated distal small bowel. IMPRESSION: 1. Nonobstructed small bowel. 2. Intraluminal contrast at the RIGHT upper quadrant, without overt extraluminal extravasation. Contrast flows out of the region interest, into nondilated distal small bowel. Performed by: Wyatt Pommier, PA-C under supervision of Thom Hall, MD Electronically Signed   By: Thom Hall M.D.   On: 05/28/2024 16:46     Signature  -   Lavada Stank M.D on 05/30/2024 at 9:03  AM   -  To page go to [email or web address]

## 2024-05-30 NOTE — Progress Notes (Signed)
 PHARMACY - ANTICOAGULATION CONSULT NOTE  Pharmacy Consult for heparin  Indication: h/o VTE  Labs: Recent Labs    05/28/24 0612 05/28/24 1801 05/29/24 0422 05/29/24 1703 05/30/24 0055  HGB 9.5*  --  8.7*  --  7.7*  HCT 29.7*  --  26.6*  --  24.4*  PLT 333  --  266  --  250  LABPROT 25.6* 20.2*  --   --  15.8*  INR 2.2* 1.6*  --   --  1.2  HEPARINUNFRC  --   --  <0.10* <0.10* 0.15*  CREATININE 0.83  --  0.73  --  0.65   Assessment: 63yo male remains subtherapeutic on heparin  after rate change; last month pt required heparin  rate > 2000 units/hr for therapeutic levels; no infusion issues or signs of bleeding per RN.  Goal of Therapy:  Heparin  level 0.3-0.7 units/ml   Plan:  Increase heparin  infusion by 20% to 2200 units/hr. Check level in 6 hours.   Marvetta Dauphin, PharmD, BCPS 05/30/2024 2:33 AM

## 2024-05-30 NOTE — Progress Notes (Signed)
 PHARMACY - TOTAL PARENTERAL NUTRITION CONSULT NOTE   Indication: not tolerating enteral feeds  Patient Measurements: Height: 5' 9 (175.3 cm) Weight: 64 kg (141 lb 1.5 oz) IBW/kg (Calculated) : 70.7 TPN AdjBW (KG): 68 Body mass index is 20.84 kg/m. Usual Weight: patient states his normal weight is 150 pounds / our scale is incorrect  Assessment: 63 YOM in need of TPN d/t inability to tolerate enteral feeds w/ recent history of N/V/D  Glucose / Insulin : CBG's <180, 0 units iSS given in past 24 hours. no prior history of DM. HgbA1C 5.9% 12/03/2023 Electrolytes: Na 133, K 3.2, Ch 101, Ca 8.3 [CoCa 9.9], Phos 2.9, Mag 1.8, CO2 24 Renal: Scr 0.65, BUN 8 Hepatic: Alk Phos wnl, AST 13, ALT wnl, Tbili wnl, Alb 2 Intake / Output; MIVF: LR @ 50 ml/hr, 200 ml ileostomy. LBM 8/28 GI Imaging: 8/27 CT Abd shows marked severity enteritis short segment or prox. Duodenum + numerous small bowel loops RUQ posterolateral mid RA. RUQ free air consistent w/ associated bowel perf. Colonic divericulosis 8/28 DG small bowel shows nonobstructed small bowel 8/28 CT Abd shows free air mid right abd and R UQ. Markedly inflamed small bowel loops mid / URQ 8/30 CT Abd ordered GI Surgeries / Procedures:   Central access: 05/29/2024 TPN start date: 05/29/2024  Nutritional Goals: Goal TPN rate is 85 mL/hr (provides 110 g of protein and 2155 kcals per day)  RD Assessment: Estimated Needs Total Energy Estimated Needs: 2100-2300 Total Protein Estimated Needs: 105-125 grams Total Fluid Estimated Needs: >/= 2 L  Current Nutrition:  NPO and TPN  Plan:  Start TPN at 32mL/hr at 1800. This will provide ~70% of nutritional needs Electrolytes in TPN: Na 60 mEq/L, K 65 mEq/L, Ca 9 mEq/L, Mg 8 mEq/L, and Phos 25 mmol/L. Cl:Ac 1:1 Add standard MVI and trace elements to TPN Thiamine  100 mg iv qday x5 days Mag 2 grams iv X1  KCL 10 meq iv x4 Initiate Sensitive q4h SSI and adjust as needed  Reduce MIVF to 35 mL/hr at  1800 Monitor TPN labs on Mon/Thurs, daily x3  Vinette Crites BS, PharmD, BCPS Clinical Pharmacist 05/30/2024 7:14 AM  Contact: [phone] after 3 PM

## 2024-05-30 NOTE — Progress Notes (Signed)
 PHARMACY - ANTICOAGULATION CONSULT NOTE  Pharmacy Consult:  Heparin  Indication: VTE treatment (warfarin PTA on hold)  No Known Allergies  Patient Measurements: Height: 5' 9 (175.3 cm) Weight: 64 kg (141 lb 1.5 oz) IBW/kg (Calculated) : 70.7 HEPARIN  DW (KG): 68  Vital Signs: Temp: 98.2 F (36.8 C) (08/30 0826) Temp Source: Oral (08/30 0826) BP: 128/64 (08/30 0826) Pulse Rate: 64 (08/30 0826)  Labs: Recent Labs    05/28/24 0612 05/28/24 0612 05/28/24 1801 05/29/24 0422 05/29/24 1703 05/30/24 0055 05/30/24 0943  HGB 9.5*  --   --  8.7*  --  7.7*  --   HCT 29.7*  --   --  26.6*  --  24.4*  --   PLT 333  --   --  266  --  250  --   LABPROT 25.6*  --  20.2*  --   --  15.8*  --   INR 2.2*  --  1.6*  --   --  1.2  --   HEPARINUNFRC  --    < >  --  <0.10* <0.10* 0.15* 0.34  CREATININE 0.83  --   --  0.73  --  0.65  --    < > = values in this interval not displayed.    Estimated Creatinine Clearance: 86.7 mL/min (by C-G formula based on SCr of 0.65 mg/dL).  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 Pharmacy consulted for heparin  gtt for bridge while patient undergoes evaluation for possible procedure.   Heparin  level therapeutic at 0.34 on 2200 units/hr. No issues with the infusion or bleeding reported per RN.   Goal of Therapy:  Heparin  level 0.3-0.7 units/ml Monitor platelets by anticoagulation protocol: Yes   Plan:  Continue heparin  infusion at 2200 units/hr Check confirmatory heparin  level in 6hrs Daily heparin  level and CBC Monitor for signs and symptoms of bleeding   Dionicia Canavan, PharmD, RPh PGY1 Acute Care Pharmacy Resident [facility]  05/30/2024 10:22 AM   Please check AMION for all [facility] Pharmacy phone numbers After 10:00 PM, call Main Pharmacy [phone]

## 2024-05-31 ENCOUNTER — Inpatient Hospital Stay (HOSPITAL_COMMUNITY)

## 2024-05-31 DIAGNOSIS — K668 Other specified disorders of peritoneum: Secondary | ICD-10-CM | POA: Diagnosis not present

## 2024-05-31 LAB — CBC WITH DIFFERENTIAL/PLATELET
Abs Immature Granulocytes: 0.04 K/uL (ref 0.00–0.07)
Basophils Absolute: 0 K/uL (ref 0.0–0.1)
Basophils Relative: 0 %
Eosinophils Absolute: 0.1 K/uL (ref 0.0–0.5)
Eosinophils Relative: 2 %
HCT: 24.1 % — ABNORMAL LOW (ref 39.0–52.0)
Hemoglobin: 7.6 g/dL — ABNORMAL LOW (ref 13.0–17.0)
Immature Granulocytes: 1 %
Lymphocytes Relative: 17 %
Lymphs Abs: 0.9 K/uL (ref 0.7–4.0)
MCH: 29.8 pg (ref 26.0–34.0)
MCHC: 31.5 g/dL (ref 30.0–36.0)
MCV: 94.5 fL (ref 80.0–100.0)
Monocytes Absolute: 0.5 K/uL (ref 0.1–1.0)
Monocytes Relative: 8 %
Neutro Abs: 4 K/uL (ref 1.7–7.7)
Neutrophils Relative %: 72 %
Platelets: 268 K/uL (ref 150–400)
RBC: 2.55 MIL/uL — ABNORMAL LOW (ref 4.22–5.81)
RDW: 15.9 % — ABNORMAL HIGH (ref 11.5–15.5)
WBC: 5.5 K/uL (ref 4.0–10.5)
nRBC: 0 % (ref 0.0–0.2)

## 2024-05-31 LAB — PROTIME-INR
INR: 1.1 (ref 0.8–1.2)
Prothrombin Time: 15.1 s (ref 11.4–15.2)

## 2024-05-31 LAB — MAGNESIUM: Magnesium: 1.7 mg/dL (ref 1.7–2.4)

## 2024-05-31 LAB — PHOSPHORUS: Phosphorus: 3.6 mg/dL (ref 2.5–4.6)

## 2024-05-31 LAB — PROCALCITONIN: Procalcitonin: 1.08 ng/mL

## 2024-05-31 LAB — COMPREHENSIVE METABOLIC PANEL WITH GFR
ALT: 9 U/L (ref 0–44)
AST: 12 U/L — ABNORMAL LOW (ref 15–41)
Albumin: 1.9 g/dL — ABNORMAL LOW (ref 3.5–5.0)
Alkaline Phosphatase: 48 U/L (ref 38–126)
Anion gap: 8 (ref 5–15)
BUN: 7 mg/dL — ABNORMAL LOW (ref 8–23)
CO2: 24 mmol/L (ref 22–32)
Calcium: 8.4 mg/dL — ABNORMAL LOW (ref 8.9–10.3)
Chloride: 102 mmol/L (ref 98–111)
Creatinine, Ser: 0.54 mg/dL — ABNORMAL LOW (ref 0.61–1.24)
GFR, Estimated: >60 mL/min (ref >60)
Glucose, Bld: 121 mg/dL — ABNORMAL HIGH (ref 70–99)
Potassium: 3.7 mmol/L (ref 3.5–5.1)
Sodium: 134 mmol/L — ABNORMAL LOW (ref 135–145)
Total Bilirubin: 0.2 mg/dL (ref 0.0–1.2)
Total Protein: 6 g/dL — ABNORMAL LOW (ref 6.5–8.1)

## 2024-05-31 LAB — C-REACTIVE PROTEIN: CRP: 10.7 mg/dL — ABNORMAL HIGH (ref <1.0)

## 2024-05-31 LAB — HEPARIN LEVEL (UNFRACTIONATED): Heparin Unfractionated: 0.4 [IU]/mL (ref 0.30–0.70)

## 2024-05-31 MED ORDER — IOHEXOL 9 MG/ML PO SOLN
500.0000 mL | ORAL | Status: AC
  Administered 2024-05-31 (×2): 500 mL via ORAL

## 2024-05-31 MED ORDER — TRAVASOL 10 % IV SOLN
INTRAVENOUS | Status: AC
  Filled 2024-05-31: qty 1101.6

## 2024-05-31 MED ORDER — MAGNESIUM SULFATE 4 GM/100ML IV SOLN
4.0000 g | Freq: Once | INTRAVENOUS | Status: AC
  Administered 2024-05-31: 4 g via INTRAVENOUS
  Filled 2024-05-31: qty 100

## 2024-05-31 MED ORDER — IOHEXOL 350 MG/ML SOLN
75.0000 mL | Freq: Once | INTRAVENOUS | Status: AC | PRN
  Administered 2024-05-31: 75 mL via INTRAVENOUS

## 2024-05-31 MED ORDER — POTASSIUM CHLORIDE 10 MEQ/50ML IV SOLN
10.0000 meq | INTRAVENOUS | Status: AC
  Administered 2024-05-31 (×5): 10 meq via INTRAVENOUS
  Filled 2024-05-31 (×5): qty 50

## 2024-05-31 NOTE — Progress Notes (Signed)
 PROGRESS NOTE                                                                                                                                                                                                             Patient Demographics:    Alexander [name], is a 63 y.o. male, DOB - 1960/12/26, FMW:[id number]  Outpatient Primary MD for the patient is [name], [name]    LOS - 3  Admit date - 05/27/2024    Chief Complaint  Patient presents with   Abdominal Pain   Nausea   Weakness   Emesis       Brief Narrative (HPI from H&P)   63 y.o. 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 Presented with acute onset of N/V, abd pain, imaging was suggestive of some free air in the abdomen which probably contained perforation, seen by general surgery and admitted to the hospital.   Subjective:   Patient in bed, appears comfortable, denies any headache, no fever, no chest pain or pressure, no shortness of breath , improving abdominal pain. No new focal weakness.'   Assessment  & Plan :   Sepsis, nausea vomiting, secondary to possible contained intra-abdominal perforation.  Patient with history of multiple abdominal surgeries and recent  intra-abdominal abscess, has ileostomy.      He has been seen by general surgery, he has complicated history of multiple abdominal surgeries, recent intra-abdominal abscess, ileostomy.  There is suspicion of contained perforation, he has been treated conservatively with bowel rest, IV fluids, pain control and antibiotics.  Will defer management of this issue to general surgery who is following the patient.    Note patient has chronic pain and is on high doses of narcotics at home, use with caution as blood pressure borderline low.  Sepsis  pathophysiology is improving, follow cultures.   Prolonged Qtc  Avoid QT prolonging medications, replace electrolytes, monitor on telemetry.   Incidental rhinovirus URI.  Asymptomatic.  Monitor.    Hypomagnesemia and hypokalemia.  Replaced.    Chronic medical problems: Holding all meds with strict NPO for now   Complex Surgical Hx per above, noted   DVT/PE: Hold Warfarin in case requires surgery. INR < 2 at present. Trend daily. Continue  Heparin  drip.  Diastolic HF: Without exacerbation, dry clinically.   HTN: not on antiHTN at time of admission   Anemia: Recent baseline ~ 8, hemoconcentrated on admission anticipate will drop towards 8. Holding iron , type screen and monitor.  Chronic pain: Home regimen is Dilaudid  2 mg PO q 4 hr prn, see acute pain management above. Holding home Gabapentin .   ?insomnia/mood: holding trazodone    ?? ASCVD: Hold aspirin ; unclear indication since on Warfarin per above      Condition - Extremely Guarded  Family Communication  : None present  Code Status : Full code  Consults  : General surgery  PUD Prophylaxis : PPI   Procedures  :     Small bowel follow-through 05/28/2024.  No extravasation of contrast.    CT 05/28/2024- 1. Findings which remain concerning for the presence of contained free air within the mid right abdomen and right upper quadrant, as described above. 2. Markedly inflamed, partially  opacified small bowel loops within the mid and upper right abdomen. 3. Curvilinear area of oral contrast within the lateral aspect of the mid to lower right abdomen which may be within a decompressed small bowel loop, however, intraluminal location cannot completely be confirmed. 4. Stable moderate to marked severity areas of atelectasis and/or infiltrate within the bilateral lower lobes. 5. Colonic diverticulosis. 6. Aortic atherosclerosis.      Disposition Plan  :    Status is: Inpatient  DVT Prophylaxis  :  Hep gtt  SCDs Start: 05/28/24 0530    Lab Results  Component Value Date   PLT 268 05/31/2024    Diet :  Diet Order             Diet NPO time specified  Diet effective now                    Inpatient Medications  Scheduled Meds:  Chlorhexidine  Gluconate Cloth  6 each Topical Daily   insulin  aspart  0-6 Units Subcutaneous Q4H   pantoprazole  (PROTONIX ) IV  40 mg Intravenous Q12H   sodium chloride  flush  10-40 mL Intracatheter Q12H   sodium chloride  flush  3 mL Intravenous Q12H   thiamine  (VITAMIN B1) injection  100 mg Intravenous Daily   Continuous Infusions:  heparin  2,200 Units/hr (05/31/24 9372)   linezolid  (ZYVOX ) IV Stopped (05/30/24 2315)   magnesium  sulfate bolus IVPB     piperacillin -tazobactam (ZOSYN )  IV 3.375 g (05/31/24 0348)   potassium chloride      TPN ADULT (ION) 60 mL/hr at 05/30/24 1810   TPN ADULT (ION)     PRN Meds:.acetaminophen , albuterol , morphine  injection **OR** morphine  injection, sodium chloride  flush, trimethobenzamide     Objective:   Vitals:   05/30/24 1157 05/30/24 1200 05/30/24 1630 05/30/24 1934  BP: 112/65 109/62 (!) 124/51   Pulse: 64  69 71  Resp: 14 16    Temp: 97.9 F (36.6 C)  98.7 F (37.1 C) 98.7 F (37.1 C)  TempSrc: Oral  Oral Oral  SpO2:    98%  Weight:  Height:        Wt Readings from Last 3 Encounters:  05/30/24 64 kg  04/29/24 55 kg  04/06/24 71 kg     Intake/Output Summary (Last 24  hours) at 05/31/2024 0759 Last data filed at 05/30/2024 1200 Gross per 24 hour  Intake 2184.72 ml  Output 300 ml  Net 1884.72 ml     Physical Exam  Awake Alert, No new F.N deficits, Normal affect [place].AT,PERRAL Supple Neck, No JVD,   Symmetrical Chest wall movement, Good air movement bilaterally, CTAB RRR,No Gallops,Rubs or new Murmurs,  +ve B.Sounds, Abd Soft, ileostomy present, tenderness throughout the abdomen mostly in the upper quadrants No Cyanosis, Clubbing or edema        Data Review:    Recent Labs  Lab 05/27/24 2000 05/28/24 0612 05/29/24 0422 05/30/24 0055 05/31/24 0156  WBC 23.4* 16.6* 11.9* 7.6 5.5  HGB 11.1* 9.5* 8.7* 7.7* 7.6*  HCT 35.3* 29.7* 26.6* 24.4* 24.1*  PLT 438* 333 266 250 268  MCV 95.7 94.6 92.4 95.3 94.5  MCH 30.1 30.3 30.2 30.1 29.8  MCHC 31.4 32.0 32.7 31.6 31.5  RDW 16.7* 16.7* 16.3* 16.1* 15.9*  LYMPHSABS 1.4  --   --  0.6* 0.9  MONOABS 1.4*  --   --  0.3 0.5  EOSABS 0.0  --   --  0.1 0.1  BASOSABS 0.1  --   --  0.0 0.0    Recent Labs  Lab 05/27/24 2000 05/27/24 2003 05/27/24 2159 05/28/24 0612 05/28/24 1801 05/29/24 0422 05/30/24 0055 05/31/24 0156  NA 136  --   --  136  --  135 133* 134*  K 4.1  --   --  3.6  --  3.3* 3.2* 3.7  CL 105  --   --  103  --  102 101 102  CO2 20*  --   --  24  --  22 24 24   ANIONGAP 11  --   --  9  --  11 8 8   GLUCOSE 120*  --   --  92  --  88 174* 121*  BUN 12  --   --  9  --  8 8 7*  CREATININE 0.89  --   --  0.83  --  0.73 0.65 0.54*  AST 19  --   --   --   --  13* 19 12*  ALT 17  --   --   --   --  11 12 9   ALKPHOS 88  --   --   --   --  69 54 48  BILITOT 0.8  --   --   --   --  0.8 0.3 0.2  ALBUMIN  3.1*  --   --   --   --  2.1* 2.0* 1.9*  CRP  --   --   --   --   --   --  20.6* 10.7*  PROCALCITON  --   --   --   --   --  3.23 2.05 1.08  LATICACIDVEN  --  1.3 1.6 1.4  --   --   --   --   INR 1.8*  --   --  2.2* 1.6*  --  1.2 1.1  MG  --   --   --  1.2*  --  1.6* 1.8 1.7  PHOS  --   --    --  3.3  --  3.7 2.9 3.6  CALCIUM  9.6  --   --  9.1  --  8.8* 8.3* 8.4*      Recent Labs  Lab 05/27/24 2000 05/27/24 2003 05/27/24 2159 05/28/24 0612 05/28/24 1801 05/29/24 0422 05/30/24 0055 05/31/24 0156  CRP  --   --   --   --   --   --  20.6* 10.7*  PROCALCITON  --   --   --   --   --  3.23 2.05 1.08  LATICACIDVEN  --  1.3 1.6 1.4  --   --   --   --   INR 1.8*  --   --  2.2* 1.6*  --  1.2 1.1  MG  --   --   --  1.2*  --  1.6* 1.8 1.7  CALCIUM  9.6  --   --  9.1  --  8.8* 8.3* 8.4*    --------------------------------------------------------------------------------------------------------------- Lab Results  Component Value Date   CHOL 114 12/03/2023   HDL 43.40 12/03/2023   LDLCALC 58 12/03/2023   TRIG 48 02/03/2024   CHOLHDL 3 12/03/2023    Lab Results  Component Value Date   HGBA1C 5.9 12/03/2023   No results for input(s): TSH, T4TOTAL, FREET4, T3FREE, THYROIDAB in the last 72 hours. No results for input(s): VITAMINB12, FOLATE, FERRITIN, TIBC, IRON , RETICCTPCT in the last 72 hours. ------------------------------------------------------------------------------------------------------------------ Cardiac Enzymes No results for input(s): CKMB, TROPONINI, MYOGLOBIN in the last 168 hours.  Invalid input(s): CK  Micro Results Recent Results (from the past 240 hours)  Resp panel by RT-PCR (RSV, Flu A&B, Covid) Anterior Nasal Swab     Status: None   Collection Time: 05/27/24  7:49 PM   Specimen: Anterior Nasal Swab  Result Value Ref Range Status   SARS Coronavirus 2 by RT PCR NEGATIVE NEGATIVE Final   Influenza A by PCR NEGATIVE NEGATIVE Final   Influenza B by PCR NEGATIVE NEGATIVE Final    Comment: (NOTE) The Xpert Xpress SARS-CoV-2/FLU/RSV plus assay is intended as an aid in the diagnosis of influenza from Nasopharyngeal swab specimens and should not be used as a sole basis for treatment. Nasal washings and aspirates are  unacceptable for Xpert Xpress SARS-CoV-2/FLU/RSV testing.  Fact Sheet for Patients: BloggerCourse.com  Fact Sheet for Healthcare Providers: SeriousBroker.it  This test is not yet approved or cleared by the [place]  FDA and has been authorized for detection and/or diagnosis of SARS-CoV-2 by FDA under an Emergency Use Authorization (EUA). This EUA will remain in effect (meaning this test can be used) for the duration of the COVID-19 declaration under Section 564(b)(1) of the Act, 21 U.S.C. section 360bbb-3(b)(1), unless the authorization is terminated or revoked.     Resp Syncytial Virus by PCR NEGATIVE NEGATIVE Final    Comment: (NOTE) Fact Sheet for Patients: BloggerCourse.com  Fact Sheet for Healthcare Providers: SeriousBroker.it  This test is not yet approved or cleared by the [place]  FDA and has been authorized for detection and/or diagnosis of SARS-CoV-2 by FDA under an Emergency Use Authorization (EUA). This EUA will remain in effect (meaning this test can be used) for the duration of the COVID-19 declaration under Section 564(b)(1) of the Act, 21 U.S.C. section 360bbb-3(b)(1), unless the authorization is terminated or revoked.  Performed at [facility], [address] [place], [place] [address]   Blood Culture (routine x 2)     Status: None (Preliminary result)   Collection Time: 05/27/24  8:00 PM   Specimen: BLOOD LEFT ARM  Result Value  Ref Range Status   Specimen Description BLOOD LEFT ARM  Final   Special Requests   Final    BOTTLES DRAWN AEROBIC AND ANAEROBIC Blood Culture adequate volume   Culture   Final    NO GROWTH 3 DAYS Performed at [facility], [address] [place], [place] [address]    Report Status PENDING  Incomplete  Blood Culture (routine x 2)     Status: None (Preliminary result)   Collection Time: 05/27/24  8:44 PM    Specimen: BLOOD RIGHT ARM  Result Value Ref Range Status   Specimen Description BLOOD RIGHT ARM  Final   Special Requests   Final    BOTTLES DRAWN AEROBIC AND ANAEROBIC Blood Culture adequate volume   Culture   Final    NO GROWTH 3 DAYS Performed at [facility], [address] [place], [place] [address]    Report Status PENDING  Incomplete  Respiratory (~20 pathogens) panel by PCR     Status: Abnormal   Collection Time: 05/28/24  8:58 AM   Specimen: Nasopharyngeal Swab; Respiratory  Result Value Ref Range Status   Adenovirus NOT DETECTED NOT DETECTED Final   Coronavirus 229E NOT DETECTED NOT DETECTED Final    Comment: (NOTE) The Coronavirus on the Respiratory Panel, DOES NOT test for the novel  Coronavirus (2019 nCoV)    Coronavirus HKU1 NOT DETECTED NOT DETECTED Final   Coronavirus NL63 NOT DETECTED NOT DETECTED Final   Coronavirus OC43 NOT DETECTED NOT DETECTED Final   Metapneumovirus NOT DETECTED NOT DETECTED Final   Rhinovirus / Enterovirus DETECTED (A) NOT DETECTED Final   Influenza A NOT DETECTED NOT DETECTED Final   Influenza B NOT DETECTED NOT DETECTED Final   Parainfluenza Virus 1 NOT DETECTED NOT DETECTED Final   Parainfluenza Virus 2 NOT DETECTED NOT DETECTED Final   Parainfluenza Virus 3 NOT DETECTED NOT DETECTED Final   Parainfluenza Virus 4 NOT DETECTED NOT DETECTED Final   Respiratory Syncytial Virus NOT DETECTED NOT DETECTED Final   Bordetella pertussis NOT DETECTED NOT DETECTED Final   Bordetella Parapertussis NOT DETECTED NOT DETECTED Final   Chlamydophila pneumoniae NOT DETECTED NOT DETECTED Final   Mycoplasma pneumoniae NOT DETECTED NOT DETECTED Final    Comment: Performed at [facility], [address] [place], [place] [address]    Radiology Report US  EKG SITE RITE Result Date: 05/29/2024 If [facility] image not attached, placement could not be confirmed due to current cardiac rhythm.    Signature  -   Lavada Stank M.D on 05/31/2024 at  7:59 AM   -  To page go to [email or web address]

## 2024-05-31 NOTE — Plan of Care (Signed)
   Problem: Education: Goal: Knowledge of General Education information will improve Description: Including pain rating scale, medication(s)/side effects and non-pharmacologic comfort measures Outcome: Progressing   Problem: Health Behavior/Discharge Planning: Goal: Ability to manage health-related needs will improve Outcome: Progressing   Problem: Clinical Measurements: Goal: Will remain free from infection Outcome: Progressing Goal: Diagnostic test results will improve Outcome: Progressing Goal: Respiratory complications will improve Outcome: Progressing Goal: Cardiovascular complication will be avoided Outcome: Progressing   Problem: Activity: Goal: Risk for activity intolerance will decrease Outcome: Progressing

## 2024-05-31 NOTE — Progress Notes (Signed)
 PHARMACY - TOTAL PARENTERAL NUTRITION CONSULT NOTE   Indication: not tolerating enteral feeds  Patient Measurements: Height: 5' 9 (175.3 cm) Weight: 64 kg (141 lb 1.5 oz) IBW/kg (Calculated) : 70.7 TPN AdjBW (KG): 68 Body mass index is 20.84 kg/m. Usual Weight: 68.2 Kg  Assessment: 63 YOM in need of TPN d/t inability to tolerate enteral feeds w/ recent history of N/V/D  Glucose / Insulin : CBG's <180, 0 units iSS given in past 24 hours. no prior history of DM. HgbA1C 5.9% 12/03/2023 Electrolytes: Na 134, K 3.7, Ch 102, Ca 8.4 [CoCa 10.08], Phos 3.6, Mag 1.7, CO2 24 Renal: Scr 0.54, BUN 7 Hepatic: Alk Phos wnl, AST 12, ALT wnl, Tbili wnl, Alb 1.9 Intake / Output; MIVF: in's all sources: 2185 ml / outs 0.13 ml/kg/hr + some unmeasured, 100 ml ileostomy. LBM 8/28. I/O net + 1884.7 mL GI Imaging: 8/27 CT Abd shows marked severity enteritis short segment or prox. Duodenum + numerous small bowel loops RUQ posterolateral mid RA. RUQ free air consistent w/ associated bowel perf. Colonic divericulosis 8/28 DG small bowel shows nonobstructed small bowel 8/28 CT Abd shows free air mid right abd and R UQ. Markedly inflamed small bowel loops mid / URQ 8/30 CT Abd ordered- study complete / results not available 0745  GI Surgeries / Procedures:  7/14- ileostomy takedown 8/27 post op check by Dr. Tanda - return 06/27/2024 for further evaluation of Abd wound dehiscence  Central access: 05/29/2024 TPN start date: 05/29/2024  Nutritional Goals: Goal TPN rate is 85 mL/hr (provides 110 g of protein and 2155 kcals per day)  RD Assessment: Estimated Needs Total Energy Estimated Needs: 2100-2300 Total Protein Estimated Needs: 105-125 grams Total Fluid Estimated Needs: >/= 2 L  Current Nutrition:  NPO and TPN  Plan:  increase TPN to 85 mL/hr at 1800. This will provide 100% of nutritional needs Electrolytes in TPN: Na 60 mEq/L, K 50 mEq/L, Ca 5 mEq/L, Mg 7 mEq/L, and Phos 19 mmol/L. Cl:Ac 1:1 Add  standard MVI and trace elements to TPN Thiamine  100 mg iv qday x5 days Mag 4 grams iv X1  KCL 10 meq iv x5 Initiate Sensitive q4h SSI and adjust as needed  Monitor TPN labs on Mon/Thurs, daily x3  Gladyes Kudo BS, PharmD, BCPS Clinical Pharmacist 05/31/2024 7:39 AM  Contact: [phone] after 3 PM

## 2024-05-31 NOTE — TOC Progression Note (Signed)
 Transition of Care [facility]) - Progression Note    Patient Details  Name: Alexander [name] MRN: [id number] Date of Birth: 03/17/1961  [facility] CM/SW Contact  Isaiah Public, LCSWA Phone Number: 05/31/2024, 3:43 PM  Clinical Narrative:     CSW spoke with patient who confirmed he comes from [facility] short term. Patient reports his plan is to return back to [facility] when medically ready. CSW following to start insurance authorization for patient, closer to patient being medically ready.   Expected Discharge Plan: Skilled Nursing Facility Barriers to Discharge: Continued Medical Work up, English as a second language teacher               Expected Discharge Plan and Services In-house Referral: Clinical Social Work   Post Acute Care Choice: Skilled Nursing Facility Living arrangements for the past 2 months: Single Family Home, Skilled Nursing Facility                                       Social Drivers of Health (SDOH) Interventions SDOH Screenings   Food Insecurity: Patient Unable To Answer (05/28/2024)  Housing: Unknown (05/28/2024)  Recent Concern: Housing - High Risk (04/10/2024)  Transportation Needs: Patient Unable To Answer (05/28/2024)  Recent Concern: Transportation Needs - Unmet Transportation Needs (03/30/2024)  Utilities: Not At Risk (05/28/2024)  Depression (PHQ2-9): Low Risk  (07/24/2023)  Financial Resource Strain: Low Risk  (05/01/2024)   Received from [facility]  Social Connections: Moderately Isolated (05/01/2024)   Received from [facility]  Stress: No Stress Concern Present (05/13/2024)   Received from [facility]  Tobacco Use: Medium Risk (05/27/2024)    Readmission Risk Interventions    05/28/2024    2:04 PM 04/30/2024   12:54 PM 04/10/2024    1:55 PM  Readmission Risk Prevention Plan  Transportation Screening Complete Complete Complete  Medication Review Oceanographer) Complete Complete Complete  PCP or Specialist appointment  within 3-5 days of discharge Complete Complete Complete  HRI or Home Care Consult Complete Complete Complete  SW Recovery Care/Counseling Consult Complete Complete Complete  Palliative Care Screening Not Applicable Not Applicable Not Applicable  Skilled Nursing Facility Complete Not Applicable Not Applicable

## 2024-05-31 NOTE — Plan of Care (Signed)
  Problem: Education: Goal: Knowledge of General Education information will improve Description: Including pain rating scale, medication(s)/side effects and non-pharmacologic comfort measures 05/31/2024 1836 by Con Marylen SAUNDERS, RN Outcome: Progressing 05/31/2024 1757 by Con Marylen SAUNDERS, RN Outcome: Progressing   Problem: Health Behavior/Discharge Planning: Goal: Ability to manage health-related needs will improve 05/31/2024 1836 by Con Marylen SAUNDERS, RN Outcome: Progressing 05/31/2024 1757 by Con Marylen SAUNDERS, RN Outcome: Progressing   Problem: Clinical Measurements: Goal: Ability to maintain clinical measurements within normal limits will improve Outcome: Progressing Goal: Will remain free from infection 05/31/2024 1836 by Con Marylen SAUNDERS, RN Outcome: Progressing 05/31/2024 1757 by Con Marylen SAUNDERS, RN Outcome: Progressing Goal: Diagnostic test results will improve Outcome: Progressing Goal: Respiratory complications will improve Outcome: Progressing Goal: Cardiovascular complication will be avoided Outcome: Progressing   Problem: Activity: Goal: Risk for activity intolerance will decrease 05/31/2024 1836 by Con Marylen SAUNDERS, RN Outcome: Progressing 05/31/2024 1757 by Con Marylen SAUNDERS, RN Outcome: Progressing   Problem: Nutrition: Goal: Adequate nutrition will be maintained Outcome: Progressing   Problem: Elimination: Goal: Will not experience complications related to bowel motility Outcome: Progressing Goal: Will not experience complications related to urinary retention Outcome: Progressing   Problem: Pain Managment: Goal: General experience of comfort will improve and/or be controlled Outcome: Progressing   Problem: Safety: Goal: Ability to remain free from injury will improve Outcome: Progressing

## 2024-05-31 NOTE — Progress Notes (Signed)
 PHARMACY - ANTICOAGULATION CONSULT NOTE  Pharmacy Consult:  Heparin  Indication: VTE treatment (warfarin PTA on hold)  No Known Allergies  Patient Measurements: Height: 5' 9 (175.3 cm) Weight: 64 kg (141 lb 1.5 oz) IBW/kg (Calculated) : 70.7 HEPARIN  DW (KG): 68  Vital Signs: Temp: 97.7 F (36.5 C) (08/31 1201) Temp Source: Oral (08/31 1201)  Labs: Recent Labs     0000 05/28/24 1801 05/29/24 0422 05/29/24 1703 05/30/24 0055 05/30/24 0943 05/30/24 1816 05/31/24 0156  HGB   < >  --  8.7*  --  7.7*  --   --  7.6*  HCT  --   --  26.6*  --  24.4*  --   --  24.1*  PLT  --   --  266  --  250  --   --  268  LABPROT  --  20.2*  --   --  15.8*  --   --  15.1  INR  --  1.6*  --   --  1.2  --   --  1.1  HEPARINUNFRC  --   --  <0.10*   < > 0.15* 0.34 0.46 0.40  CREATININE  --   --  0.73  --  0.65  --   --  0.54*   < > = values in this interval not displayed.    Estimated Creatinine Clearance: 86.7 mL/min (A) (by C-G formula based on SCr of 0.54 mg/dL (L)).  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 Pharmacy consulted for heparin  gtt for bridge while patient undergoes evaluation for possible procedure.   Heparin  level remains therapeutic (0.40) on infusion at 2200 units/hr. No documented issues with heparin  infusion or signs/symptoms of bleeding. Hgb stable, platelets within normal limits.  Goal of Therapy:  Heparin  level 0.3-0.7 units/ml Monitor platelets by anticoagulation protocol: Yes   Plan:  Continue heparin  infusion at 2200 units/hr Daily heparin  level and CBC Monitor for signs and symptoms of bleeding   Dionicia Canavan, PharmD, RPh PGY1 Acute Care Pharmacy Resident [facility]  05/31/2024 12:33 PM

## 2024-05-31 NOTE — Progress Notes (Signed)
 Progress Note     Subjective: Pt upset over plan for NPO. TPN going  Objective: Vital signs in last 24 hours: Temp:  [97.7 F (36.5 C)-98.7 F (37.1 C)] 97.7 F (36.5 C) (08/31 0851) Pulse Rate:  [64-71] 71 (08/30 1934) Resp:  [14-16] 16 (08/30 1200) BP: (109-124)/(51-65) 124/51 (08/30 1630) SpO2:  [98 %] 98 % (08/30 1934) Last BM Date : 05/28/24  Intake/Output from previous day: 08/30 0701 - 08/31 0700 In: 2184.7 [I.V.:1602.2; IV Piggyback:582.6] Out: 300 [Urine:200; Stool:100] Intake/Output this shift: No intake/output data recorded.  PE: General: unpleasant, WD male who is laying in bed in NAD Heart: regular, rate, and rhythm. Lungs: Respiratory effort nonlabored on RA Abd: Pt declined for me to examine his abdomen   Lab Results:  Recent Labs    05/30/24 0055 05/31/24 0156  WBC 7.6 5.5  HGB 7.7* 7.6*  HCT 24.4* 24.1*  PLT 250 268   BMET Recent Labs    05/30/24 0055 05/31/24 0156  NA 133* 134*  K 3.2* 3.7  CL 101 102  CO2 24 24  GLUCOSE 174* 121*  BUN 8 7*  CREATININE 0.65 0.54*  CALCIUM  8.3* 8.4*   PT/INR Recent Labs    05/30/24 0055 05/31/24 0156  LABPROT 15.8* 15.1  INR 1.2 1.1   CMP     Component Value Date/Time   NA 134 (L) 05/31/2024 0156   NA 141 01/22/2020 1137   K 3.7 05/31/2024 0156   CL 102 05/31/2024 0156   CO2 24 05/31/2024 0156   GLUCOSE 121 (H) 05/31/2024 0156   BUN 7 (L) 05/31/2024 0156   BUN 13 01/22/2020 1137   CREATININE 0.54 (L) 05/31/2024 0156   CREATININE 0.98 06/14/2017 0935   CALCIUM  8.4 (L) 05/31/2024 0156   PROT 6.0 (L) 05/31/2024 0156   PROT 7.3 01/22/2020 1137   ALBUMIN  1.9 (L) 05/31/2024 0156   ALBUMIN  4.2 01/22/2020 1137   AST 12 (L) 05/31/2024 0156   ALT 9 05/31/2024 0156   ALKPHOS 48 05/31/2024 0156   BILITOT 0.2 05/31/2024 0156   BILITOT 0.4 01/22/2020 1137   GFRNONAA >60 05/31/2024 0156   GFRNONAA >89 09/17/2014 1253   GFRAA 111 01/22/2020 1137   GFRAA >89 09/17/2014 1253   Lipase      Component Value Date/Time   LIPASE 28 04/03/2024 0200       Studies/Results: US  EKG SITE RITE Result Date: 05/29/2024 If Site Rite image not attached, placement could not be confirmed due to current cardiac rhythm.   Anti-infectives: Anti-infectives (From admission, onward)    Start     Dose/Rate Route Frequency Ordered Stop   05/29/24 1330  linezolid  (ZYVOX ) IVPB 600 mg        600 mg 300 mL/hr over 60 Minutes Intravenous Every 12 hours 05/29/24 1230     05/28/24 2000  piperacillin -tazobactam (ZOSYN ) IVPB 3.375 g        3.375 g 12.5 mL/hr over 240 Minutes Intravenous Every 8 hours 05/28/24 0534     05/28/24 0900  metroNIDAZOLE  (FLAGYL ) IVPB 500 mg        500 mg 100 mL/hr over 60 Minutes Intravenous  Once 05/28/24 0534 05/28/24 1104   05/28/24 0630  DAPTOmycin  (CUBICIN ) IVPB 700 mg/139mL premix        10 mg/kg  68 kg 200 mL/hr over 30 Minutes Intravenous  Once 05/28/24 0627 05/28/24 0754   05/27/24 1945  cefTRIAXone  (ROCEPHIN ) 2 g in sodium chloride  0.9 % 100 mL IVPB  2 g 200 mL/hr over 30 Minutes Intravenous Once 05/27/24 1934 05/27/24 2125   05/27/24 1945  metroNIDAZOLE  (FLAGYL ) IVPB 500 mg        500 mg 100 mL/hr over 60 Minutes Intravenous  Once 05/27/24 1934 05/27/24 2236        Assessment/Plan  12M with complex medical and surgical history, most recent surgery 04/13/24 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 Tanda Regal bowel inflammation with concern for possible perforation - known hostile abdomen - nausea and vomiting, small bowel enteritis and small volume pneumoperitoneum in the RUQ on CT 8/27 - CT 8/28 with concern for inflamed small bowel loop on the right with concern for contained perforation but also possible contrast leak vs contrast within decompressed loop of small  bowel  - SBFT 8/28 without contrast extravasation  - continue to recommend strict NPO for now, PICC and TPN ordered given albumin  of  2.1 - continue abx - CT 8/31 - no signs of small bowel leak on my exam, will start CLD.  Can advance diet as tolerated if pt remains asymptomatic on clears - pt remains hemodynamically stable, WBC WNL now  - hgb 8.7, continue to monitor    FEN: clears, PICC and TPN ordered, IVF per [facility] VTE: heparin  gtt ID: Zosyn  8/28>>  - per [facility] -  Hx of PE/DVT Pulmonary HTN PAD HTN GERD Gout  LOS: 3 days   I reviewed nursing notes, hospitalist notes, last 24 h vitals and pain scores, last 48 h intake and output, last 24 h labs and trends, and last 24 h imaging results.  This care required moderate level of medical decision making.    Alta Shober C Anastasha Ortez, MD  [facility] Surgery 05/31/2024, 9:43 AM Please see Amion for pager number during day hours 7:00am-4:30pm

## 2024-05-31 NOTE — Plan of Care (Signed)

## 2024-06-01 DIAGNOSIS — K668 Other specified disorders of peritoneum: Secondary | ICD-10-CM | POA: Diagnosis not present

## 2024-06-01 LAB — CBC WITH DIFFERENTIAL/PLATELET
Abs Immature Granulocytes: 0.08 K/uL — ABNORMAL HIGH (ref 0.00–0.07)
Basophils Absolute: 0 K/uL (ref 0.0–0.1)
Basophils Relative: 0 %
Eosinophils Absolute: 0.1 K/uL (ref 0.0–0.5)
Eosinophils Relative: 2 %
HCT: 25.6 % — ABNORMAL LOW (ref 39.0–52.0)
Hemoglobin: 8.1 g/dL — ABNORMAL LOW (ref 13.0–17.0)
Immature Granulocytes: 1 %
Lymphocytes Relative: 22 %
Lymphs Abs: 1.2 K/uL (ref 0.7–4.0)
MCH: 30.1 pg (ref 26.0–34.0)
MCHC: 31.6 g/dL (ref 30.0–36.0)
MCV: 95.2 fL (ref 80.0–100.0)
Monocytes Absolute: 0.6 K/uL (ref 0.1–1.0)
Monocytes Relative: 10 %
Neutro Abs: 3.5 K/uL (ref 1.7–7.7)
Neutrophils Relative %: 65 %
Platelets: 293 K/uL (ref 150–400)
RBC: 2.69 MIL/uL — ABNORMAL LOW (ref 4.22–5.81)
RDW: 16 % — ABNORMAL HIGH (ref 11.5–15.5)
WBC: 5.6 K/uL (ref 4.0–10.5)
nRBC: 0 % (ref 0.0–0.2)

## 2024-06-01 LAB — COMPREHENSIVE METABOLIC PANEL WITH GFR
ALT: 10 U/L (ref 0–44)
AST: 12 U/L — ABNORMAL LOW (ref 15–41)
Albumin: 2.1 g/dL — ABNORMAL LOW (ref 3.5–5.0)
Alkaline Phosphatase: 49 U/L (ref 38–126)
Anion gap: 5 (ref 5–15)
BUN: <5 mg/dL — ABNORMAL LOW (ref 8–23)
CO2: 28 mmol/L (ref 22–32)
Calcium: 8.7 mg/dL — ABNORMAL LOW (ref 8.9–10.3)
Chloride: 101 mmol/L (ref 98–111)
Creatinine, Ser: 0.53 mg/dL — ABNORMAL LOW (ref 0.61–1.24)
GFR, Estimated: >60 mL/min (ref >60)
Glucose, Bld: 116 mg/dL — ABNORMAL HIGH (ref 70–99)
Potassium: 4.4 mmol/L (ref 3.5–5.1)
Sodium: 134 mmol/L — ABNORMAL LOW (ref 135–145)
Total Bilirubin: 0.2 mg/dL (ref 0.0–1.2)
Total Protein: 6.5 g/dL (ref 6.5–8.1)

## 2024-06-01 LAB — CULTURE, BLOOD (ROUTINE X 2)
Culture: NO GROWTH
Culture: NO GROWTH
Special Requests: ADEQUATE
Special Requests: ADEQUATE

## 2024-06-01 LAB — PROTIME-INR
INR: 1 (ref 0.8–1.2)
Prothrombin Time: 13.7 s (ref 11.4–15.2)

## 2024-06-01 LAB — HEPARIN LEVEL (UNFRACTIONATED): Heparin Unfractionated: 0.5 [IU]/mL (ref 0.30–0.70)

## 2024-06-01 LAB — PROCALCITONIN: Procalcitonin: 0.53 ng/mL

## 2024-06-01 LAB — C-REACTIVE PROTEIN: CRP: 7.4 mg/dL — ABNORMAL HIGH (ref <1.0)

## 2024-06-01 LAB — PHOSPHORUS: Phosphorus: 5.2 mg/dL — ABNORMAL HIGH (ref 2.5–4.6)

## 2024-06-01 LAB — MAGNESIUM: Magnesium: 1.8 mg/dL (ref 1.7–2.4)

## 2024-06-01 LAB — TRIGLYCERIDES: Triglycerides: 45 mg/dL (ref <150)

## 2024-06-01 LAB — GLUCOSE, CAPILLARY: Glucose-Capillary: 126 mg/dL — ABNORMAL HIGH (ref 70–99)

## 2024-06-01 MED ORDER — TRAZODONE HCL 50 MG PO TABS
100.0000 mg | ORAL_TABLET | Freq: Every day | ORAL | Status: DC
  Administered 2024-06-01 – 2024-06-10 (×10): 100 mg via ORAL
  Filled 2024-06-01 (×10): qty 2

## 2024-06-01 MED ORDER — TRAVASOL 10 % IV SOLN
INTRAVENOUS | Status: AC
  Filled 2024-06-01: qty 1060.8

## 2024-06-01 MED ORDER — MAGNESIUM SULFATE IN D5W 1-5 GM/100ML-% IV SOLN
1.0000 g | Freq: Once | INTRAVENOUS | Status: AC
  Administered 2024-06-01: 1 g via INTRAVENOUS
  Filled 2024-06-01: qty 100

## 2024-06-01 MED ORDER — GABAPENTIN 100 MG PO CAPS
200.0000 mg | ORAL_CAPSULE | Freq: Three times a day (TID) | ORAL | Status: DC
  Administered 2024-06-01 – 2024-06-10 (×30): 200 mg via ORAL
  Filled 2024-06-01 (×30): qty 2

## 2024-06-01 MED ORDER — ENSURE PLUS HIGH PROTEIN PO LIQD
237.0000 mL | Freq: Two times a day (BID) | ORAL | Status: DC
  Administered 2024-06-03 (×2): 237 mL via ORAL

## 2024-06-01 NOTE — Plan of Care (Signed)
  Problem: Clinical Measurements: Goal: Ability to maintain clinical measurements within normal limits will improve Outcome: Progressing Goal: Respiratory complications will improve Outcome: Progressing   Problem: Activity: Goal: Risk for activity intolerance will decrease Outcome: Progressing   Problem: Nutrition: Goal: Adequate nutrition will be maintained Outcome: Progressing

## 2024-06-01 NOTE — Progress Notes (Signed)
 Progress Note     Subjective: No nausea or abd pain  Objective: Vital signs in last 24 hours: Temp:  [97.7 F (36.5 C)-98.9 F (37.2 C)] 97.9 F (36.6 C) (09/01 0748) Pulse Rate:  [60-73] 71 (09/01 0748) Resp:  [16-20] 18 (09/01 0503) BP: (128-146)/(62-78) 146/78 (09/01 0503) Last BM Date : 05/31/24  Intake/Output from previous day: 08/31 0701 - 09/01 0700 In: 0  Out: 2390 [Urine:2150; Stool:240] Intake/Output this shift: Total I/O In: -  Out: 300 [Urine:300]  PE: General: unpleasant, WD male who is laying in bed in NAD Heart: regular, rate, and rhythm. Lungs: Respiratory effort nonlabored on RA Abd: Pt declined for me to examine his abdomen   Lab Results:  Recent Labs    05/31/24 0156 06/01/24 0252  WBC 5.5 5.6  HGB 7.6* 8.1*  HCT 24.1* 25.6*  PLT 268 293   BMET Recent Labs    05/31/24 0156 06/01/24 0252  NA 134* 134*  K 3.7 4.4  CL 102 101  CO2 24 28  GLUCOSE 121* 116*  BUN 7* <5*  CREATININE 0.54* 0.53*  CALCIUM  8.4* 8.7*   PT/INR Recent Labs    05/31/24 0156 06/01/24 0252  LABPROT 15.1 13.7  INR 1.1 1.0   CMP     Component Value Date/Time   NA 134 (L) 06/01/2024 0252   NA 141 01/22/2020 1137   K 4.4 06/01/2024 0252   CL 101 06/01/2024 0252   CO2 28 06/01/2024 0252   GLUCOSE 116 (H) 06/01/2024 0252   BUN <5 (L) 06/01/2024 0252   BUN 13 01/22/2020 1137   CREATININE 0.53 (L) 06/01/2024 0252   CREATININE 0.98 06/14/2017 0935   CALCIUM  8.7 (L) 06/01/2024 0252   PROT 6.5 06/01/2024 0252   PROT 7.3 01/22/2020 1137   ALBUMIN  2.1 (L) 06/01/2024 0252   ALBUMIN  4.2 01/22/2020 1137   AST 12 (L) 06/01/2024 0252   ALT 10 06/01/2024 0252   ALKPHOS 49 06/01/2024 0252   BILITOT 0.2 06/01/2024 0252   BILITOT 0.4 01/22/2020 1137   GFRNONAA >60 06/01/2024 0252   GFRNONAA >89 09/17/2014 1253   GFRAA 111 01/22/2020 1137   GFRAA >89 09/17/2014 1253   Lipase     Component Value Date/Time   LIPASE 28 04/03/2024 0200        Studies/Results: CT ABDOMEN PELVIS W CONTRAST Result Date: 05/31/2024 EXAM: CT ABDOMEN AND PELVIS WITH CONTRAST 05/31/2024 06:57:38 AM TECHNIQUE: CT of the abdomen and pelvis was performed with the administration of intravenous contrast. Multiplanar reformatted images are provided for review. Automated exposure control, iterative reconstruction, and/or weight-based adjustment of the mA/kV was utilized to reduce the radiation dose to as low as reasonably achievable. COMPARISON: 05/28/2024 CLINICAL HISTORY: Peritonitis or perforation suspected; Oral and IV contrast. Pt only drank of oral contrast. FINDINGS: LOWER CHEST: New small right pleural effusion. Progressive consolidation within both lower lung zones with persistent ground glass and airspace densities in the right middle lobe and visualized portions of the right lower lung. LIVER: No suspicious focal liver lesion. GALLBLADDER AND BILE DUCTS: Pericholecystic edema is again noted with mild asymmetric gallbladder wall thickening likely reflecting secondary inflammation. This is similar to the prior exam. No gallstones identified. Common bile duct is normal in caliber. SPLEEN: Normal size. No focal lesion. PANCREAS: No pancreatic mass, main duct dilatation or inflammation. ADRENAL GLANDS: Normal appearance. No mass. KIDNEYS, URETERS AND BLADDER: Wall thickening along the dome of bladder likely reflux secondary to inflammation, similar to prior exam.  No stones in the kidneys or ureters. No hydronephrosis. No perinephric or periureteral stranding. GI AND BOWEL: Enteric contrast material is identified within the stomach and small bowel loops up to the level of the right lower quadrant ileostomy. Enterocolonic anastomosis at the level of the mid transverse colon (axial image 36). Contrast material is identified within the left colon. Extensive edema with surrounding inflammatory change involving the descending duodenum and transverse duodenum (axial  image 50/6). Compared with the previous exam, the extent of inflammatory changes within this area appears similar to decreased in the interval. Multiple foci of gas within the right upper quadrant of the abdomen in the area of edema and inflammatory change noted lateral and inferior to the descending duodenum, similar to the previous exam. Cannot exclude focal small bowel fistula. No pathologic dilatation of the bowel loops. Along the right paracolic gutter there is a soft tissue track containing oral contrast material which extends up to the inferior margin of the right lobe of liver and may communicate with the area of inflammatory change with loculated foci of gas (coronal image 37/6). When compared with the previous exam, the degree of contrast material within the soft tissue track appears decreased. PERITONEUM AND RETROPERITONEUM: Trace free fluid within the dependent portion of the pelvis. No free air. VASCULATURE: Aortic atherosclerosis. Patent upper abdominal vascularity. LYMPH NODES: No lymphadenopathy. REPRODUCTIVE ORGANS: No significant abnormality. 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 No acute osseous abnormality. IMPRESSION: 1. Extensive edema and inflammatory change involving the descending and transverse duodenum, similar to prior exam. 2. Soft tissue track containing oral contrast material along the right paracolic gutter, possibly communicating with the area of inflammatory change and loculated gas. The degree of contrast material within the soft tissue track appears decreased compared to the prior exam. This soft tissue tract is suspicious for an underlying small bowel fistula and I suspect likely accounts for the persistent inflammation and loculated gas around the descending duodenum and gallbladder. 3. Mild asymmetric gallbladder wall thickening and pericholecystic  edema, similar to prior exam, likely reflecting secondary inflammation. No gallstones identified. 4. Wall thickening along the dome of the bladder, similar to prior exam, likely secondary to inflammation. 5. New small right pleural effusion and progressive consolidation within both lower lung zones. Electronically signed by: Waddell Calk MD 05/31/2024 10:30 AM EDT RP Workstation: [id number]    Anti-infectives: Anti-infectives (From admission, onward)    Start     Dose/Rate Route Frequency Ordered Stop   05/29/24 1330  linezolid  (ZYVOX ) IVPB 600 mg        600 mg 300 mL/hr over 60 Minutes Intravenous Every 12 hours 05/29/24 1230     05/28/24 2000  piperacillin -tazobactam (ZOSYN ) IVPB 3.375 g        3.375 g 12.5 mL/hr over 240 Minutes Intravenous Every 8 hours 05/28/24 0534     05/28/24 0900  metroNIDAZOLE  (FLAGYL ) IVPB 500 mg        500 mg 100 mL/hr over 60 Minutes Intravenous  Once 05/28/24 0534 05/28/24 1104   05/28/24 0630  DAPTOmycin  (CUBICIN ) IVPB 700 mg/116mL premix        10 mg/kg  68 kg 200 mL/hr over 30 Minutes Intravenous  Once 05/28/24 0627 05/28/24 0754   05/27/24 1945  cefTRIAXone  (ROCEPHIN ) 2 g in sodium chloride  0.9 % 100 mL IVPB        2 g 200 mL/hr over 30 Minutes  Intravenous Once 05/27/24 1934 05/27/24 2125   05/27/24 1945  metroNIDAZOLE  (FLAGYL ) IVPB 500 mg        500 mg 100 mL/hr over 60 Minutes Intravenous  Once 05/27/24 1934 05/27/24 2236        Assessment/Plan  89M with complex medical and surgical history, most recent surgery 04/13/24 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 Tanda Regal bowel inflammation with concern for possible perforation - known hostile abdomen - nausea and vomiting, small bowel enteritis and small volume pneumoperitoneum in the RUQ on CT 8/27 - CT 8/28 with concern for inflamed small bowel loop on the right with concern for contained perforation but also possible contrast leak vs contrast  within decompressed loop of small  bowel  - SBFT 8/28 without contrast extravasation  - continue to recommend strict NPO for now, PICC and TPN ordered given albumin  of 2.1 - continue abx - CT 8/31 - same to slightly improved.  will advance to FLD today - pt remains hemodynamically stable, WBC WNL  - hgb 8.7, continue to monitor    FEN: FLD, PICC and TPN, IVF per [facility] VTE: heparin  gtt ID: Zosyn  8/28>>  - per [facility] -  Hx of PE/DVT Pulmonary HTN PAD HTN GERD Gout  LOS: 4 days   I reviewed nursing notes, hospitalist notes, last 24 h vitals and pain scores, last 48 h intake and output, last 24 h labs and trends, and last 24 h imaging results.  This care required moderate level of medical decision making.    Bernarda JAYSON Ned, MD  [facility] Surgery 06/01/2024, 8:18 AM Please see Amion for pager number during day hours 7:00am-4:30pm

## 2024-06-01 NOTE — Progress Notes (Addendum)
 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 Usual Weight: 68.2 Kg  Assessment:  63 yo M with complex surgical hx including 12/2023 developed colonic perforation I/s/o diverticulitis requiring emergent ex lap initially left in discontinuity and required additional subsequent SBR and ileostomy, complicated postoperative course including cardiac arrest, shock, renal failure requiring CRRT, intrabdominal abscess requiring drainage including VRE intraabdominal infection. Most recent surgery 04/13/24 with ileocolic anastomosis and diverting loop ileostomy for fascial dehiscence. Admitted 8/27 with NVD and concern for perforated small bowel. Pharmacy consulted for TPN.  Glucose / Insulin : BG <130, 0 units insulin . No history of DM. HgbA1C 5.9% 12/03/2023 Electrolytes: Na 134, K 4.4 (Received 40 mEq IV), CoCa 10.2, Phos 5.2, Mag 1.8 (received 4g IV+ 1g 9/1), others wnl Renal: Scr 0.5, BUN <5 Hepatic: Alk Phos wnl, AST 12, ALT wnl, Tbili wnl, Alb 2.1, TG 45  Intake / Output; MIVF:  UOP 1.4 ml/kg/hr, ostomy   GI Imaging: 8/27 CT: marked enteritis, free air RUQ consistent with bowel perforation 8/28 KUB: non-obstructed small bowel, no extraluminal extravasation  8/28 CT: contained free air RUQ, markedly inflamed SB loops   8/31 CT: persistent inflammation and loculated gas around duodenum, suspect SB fistula  GI Surgeries / Procedures:  7/14- ileostomy takedown 8/27 post op check by Dr. Tanda - return 06/27/2024 for further evaluation of Abd wound dehiscence  Central access: 05/29/2024 TPN start date: 05/29/2024  Nutritional Goals: Goal TPN rate is 85 mL/hr (provides 106 g of protein and 2117 kcals per day)  RD Assessment: Estimated Needs Total Energy Estimated  Needs: 2100-2300 Total Protein Estimated Needs: 105-125 grams Total Fluid Estimated Needs: >/= 2 L  Current Nutrition:  TPN 8/31 CLD 9/1 FLD, ensure ordered   Plan:  Continue TPN at goal 85 mL/hr at 1800, provides 100% of estimated needs  Electrolytes in TPN: increase Na 125 mEq/L, decrease K 45 mEq/L, decrease  Ca 2 mEq/L, increase Mg 10 mEq/L, decrease Phos 10 mmol/L. Cl:Ac 1:1 Add standard MVI and trace elements to TPN Thiamine  100 mg iv qday x5 days  Stop Sensitive q4h SSI  Monitor TPN labs on Mon/Thurs, and PRN F/u po intake, wean TPN as able   Jinnie Door, PharmD, BCPS, BCCP Clinical Pharmacist  Please check AMION for all [facility] Pharmacy phone numbers After 10:00 PM, call Main Pharmacy [phone]

## 2024-06-01 NOTE — Progress Notes (Signed)
 PROGRESS NOTE                                                                                                                                                                                                             Patient Demographics:    Alexander [name], is a 63 y.o. male, DOB - Aug 29, 1961, FMW:[id number]  Outpatient Primary MD for the patient is [name], [name]    LOS - 4  Admit date - 05/27/2024    Chief Complaint  Patient presents with   Abdominal Pain   Nausea   Weakness   Emesis       Brief Narrative (HPI from H&P)   63 y.o. male with complex hx in 4/'25 developed colonic perforation I/s/o diverticulitis requiring emergent ex lap initially left in discontinuity and required additional subsequent SBR and ileostomy, complicated postoperative course including cardiac arrest, shock, renal failure requiring CRRT, intrabdominal abscess requiring drainage including VRE intraabdominal infection (S to Daptomycin ), ultimately discharged to [facility], and readmitted in 7/'25 with wound dehiscence and underwent ex lap 7/14 with extensive LOA, sigmoid colectomy and ileocolonic anastamosis and creation of a loop ileostomy, and 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 Presented with acute onset of N/V, abd pain, imaging was suggestive of some free air in the abdomen which probably contained perforation, seen by general surgery and admitted to the hospital.   Subjective:   Patient in bed, appears comfortable, denies any headache, no fever, no chest pain or pressure, no shortness of breath , improved abdominal pain. No new focal weakness.    Assessment  & Plan :   Sepsis, nausea vomiting, secondary to possible contained intra-abdominal perforation.  Patient with history of multiple abdominal surgeries and recent  intra-abdominal abscess, has ileostomy.      He has been seen by general surgery, he has complicated history of multiple abdominal surgeries, recent intra-abdominal abscess, ileostomy.  There is suspicion of contained perforation, he has been treated conservatively with bowel rest, IV fluids, TNA, pain control and antibiotics.  Will defer management of this issue to general surgery who is following the patient.  Placed on clear liquids on 05/31/2024, defer diet advancement to general surgery.  Clinically improving.  Note patient has chronic pain and is on  high doses of narcotics at home, use with caution as blood pressure borderline low.  Sepsis pathophysiology is improving, but cultures negative's x 5 days,   Prolonged Qtc  Avoid QT prolonging medications, replace electrolytes, monitor on telemetry.   Incidental rhinovirus URI.  Asymptomatic.  Monitor.    Hypomagnesemia and hypokalemia.  Replaced.    Chronic medical problems: Reintroduce oral medications as he is more consistent with oral diet.  Complex Surgical Hx per above, noted   DVT/PE: Hold Warfarin in case requires surgery. INR < 2 at present. Trend daily. Continue  Heparin  drip.  Diastolic HF: Without exacerbation, dry clinically.   HTN: not on antiHTN at time of admission   Anemia: Recent baseline ~ 8, hemoconcentrated on admission anticipate will drop towards 8. Holding iron , type screen and monitor.  Chronic pain: Home regimen is Dilaudid  2 mg PO q 4 hr prn, see acute pain management above. Holding home Gabapentin .   ?insomnia/mood: holding trazodone    ?? ASCVD: Hold aspirin ; unclear indication since on Warfarin per above      Condition - Extremely Guarded  Family Communication  : None present  Code Status : Full code  Consults  : General surgery  PUD Prophylaxis : PPI   Procedures  :     Small bowel follow-through 05/28/2024.  No extravasation of contrast.    CT 05/28/2024- 1. Findings which remain concerning  for the presence of contained free air within the mid right abdomen and right upper quadrant, as described above. 2. Markedly inflamed, partially opacified small bowel loops within the mid and upper right abdomen. 3. Curvilinear area of oral contrast within the lateral aspect of the mid to lower right abdomen which may be within a decompressed small bowel loop, however, intraluminal location cannot completely be confirmed. 4. Stable moderate to marked severity areas of atelectasis and/or infiltrate within the bilateral lower lobes. 5. Colonic diverticulosis. 6. Aortic atherosclerosis.      Disposition Plan  :    Status is: Inpatient  DVT Prophylaxis  :  Hep gtt  SCDs Start: 05/28/24 0530    Lab Results  Component Value Date   PLT 293 06/01/2024    Diet :  Diet Order             Diet clear liquid Room service appropriate? Yes; Fluid consistency: Thin  Diet effective now                    Inpatient Medications  Scheduled Meds:  Chlorhexidine  Gluconate Cloth  6 each Topical Daily   insulin  aspart  0-6 Units Subcutaneous Q4H   pantoprazole  (PROTONIX ) IV  40 mg Intravenous Q12H   sodium chloride  flush  10-40 mL Intracatheter Q12H   sodium chloride  flush  3 mL Intravenous Q12H   thiamine  (VITAMIN B1) injection  100 mg Intravenous Daily   Continuous Infusions:  heparin  2,200 Units/hr (05/31/24 1108)   linezolid  (ZYVOX ) IV 600 mg (05/31/24 2235)   magnesium  sulfate bolus IVPB     piperacillin -tazobactam (ZOSYN )  IV 3.375 g (06/01/24 0307)   TPN ADULT (ION) 85 mL/hr at 05/31/24 1751   PRN Meds:.acetaminophen , albuterol , morphine  injection **OR** morphine  injection, sodium chloride  flush, trimethobenzamide     Objective:   Vitals:   05/31/24 2048 06/01/24 0000 06/01/24 0503 06/01/24 0748  BP: 134/68 128/69 (!) 146/78   Pulse: 70 70 60 71  Resp: 20 16 18    Temp: 98.9 F (37.2 C) 98.7 F (37.1 C) 98.5 F (36.9 C) 97.9  F (36.6 C)  TempSrc: Oral Oral Oral Oral   SpO2:      Weight:      Height:        Wt Readings from Last 3 Encounters:  05/30/24 64 kg  04/29/24 55 kg  04/06/24 71 kg     Intake/Output Summary (Last 24 hours) at 06/01/2024 0752 Last data filed at 06/01/2024 0744 Gross per 24 hour  Intake 0 ml  Output 2690 ml  Net -2690 ml     Physical Exam  Awake Alert, No new F.N deficits, Normal affect [place].AT,PERRAL Supple Neck, No JVD,   Symmetrical Chest wall movement, Good air movement bilaterally, CTAB RRR,No Gallops,Rubs or new Murmurs,  +ve B.Sounds, Abd Soft, ileostomy present, tenderness throughout the abdomen mostly in the upper quadrants No Cyanosis, Clubbing or edema        Data Review:    Recent Labs  Lab 05/27/24 2000 05/28/24 0612 05/29/24 0422 05/30/24 0055 05/31/24 0156 06/01/24 0252  WBC 23.4* 16.6* 11.9* 7.6 5.5 5.6  HGB 11.1* 9.5* 8.7* 7.7* 7.6* 8.1*  HCT 35.3* 29.7* 26.6* 24.4* 24.1* 25.6*  PLT 438* 333 266 250 268 293  MCV 95.7 94.6 92.4 95.3 94.5 95.2  MCH 30.1 30.3 30.2 30.1 29.8 30.1  MCHC 31.4 32.0 32.7 31.6 31.5 31.6  RDW 16.7* 16.7* 16.3* 16.1* 15.9* 16.0*  LYMPHSABS 1.4  --   --  0.6* 0.9 1.2  MONOABS 1.4*  --   --  0.3 0.5 0.6  EOSABS 0.0  --   --  0.1 0.1 0.1  BASOSABS 0.1  --   --  0.0 0.0 0.0    Recent Labs  Lab 05/27/24 2000 05/27/24 2003 05/27/24 2159 05/28/24 0612 05/28/24 1801 05/29/24 0422 05/30/24 0055 05/31/24 0156 06/01/24 0252  NA 136  --   --  136  --  135 133* 134* 134*  K 4.1  --   --  3.6  --  3.3* 3.2* 3.7 4.4  CL 105  --   --  103  --  102 101 102 101  CO2 20*  --   --  24  --  22 24 24 28   ANIONGAP 11  --   --  9  --  11 8 8 5   GLUCOSE 120*  --   --  92  --  88 174* 121* 116*  BUN 12  --   --  9  --  8 8 7* <5*  CREATININE 0.89  --   --  0.83  --  0.73 0.65 0.54* 0.53*  AST 19  --   --   --   --  13* 19 12* 12*  ALT 17  --   --   --   --  11 12 9 10   ALKPHOS 88  --   --   --   --  69 54 48 49  BILITOT 0.8  --   --   --   --  0.8 0.3 0.2 0.2  ALBUMIN   3.1*  --   --   --   --  2.1* 2.0* 1.9* 2.1*  CRP  --   --   --   --   --   --  20.6* 10.7* 7.4*  PROCALCITON  --   --   --   --   --  3.23 2.05 1.08 0.53  LATICACIDVEN  --  1.3 1.6 1.4  --   --   --   --   --  INR 1.8*  --   --  2.2* 1.6*  --  1.2 1.1 1.0  MG  --   --   --  1.2*  --  1.6* 1.8 1.7 1.8  PHOS  --   --   --  3.3  --  3.7 2.9 3.6 5.2*  CALCIUM  9.6  --   --  9.1  --  8.8* 8.3* 8.4* 8.7*      Recent Labs  Lab 05/27/24 2003 05/27/24 2159 05/28/24 0612 05/28/24 1801 05/29/24 0422 05/30/24 0055 05/31/24 0156 06/01/24 0252  CRP  --   --   --   --   --  20.6* 10.7* 7.4*  PROCALCITON  --   --   --   --  3.23 2.05 1.08 0.53  LATICACIDVEN 1.3 1.6 1.4  --   --   --   --   --   INR  --   --  2.2* 1.6*  --  1.2 1.1 1.0  MG  --   --  1.2*  --  1.6* 1.8 1.7 1.8  CALCIUM   --   --  9.1  --  8.8* 8.3* 8.4* 8.7*    --------------------------------------------------------------------------------------------------------------- Lab Results  Component Value Date   CHOL 114 12/03/2023   HDL 43.40 12/03/2023   LDLCALC 58 12/03/2023   TRIG 45 06/01/2024   CHOLHDL 3 12/03/2023    Lab Results  Component Value Date   HGBA1C 5.9 12/03/2023   No results for input(s): TSH, T4TOTAL, FREET4, T3FREE, THYROIDAB in the last 72 hours. No results for input(s): VITAMINB12, FOLATE, FERRITIN, TIBC, IRON , RETICCTPCT in the last 72 hours. ------------------------------------------------------------------------------------------------------------------ Cardiac Enzymes No results for input(s): CKMB, TROPONINI, MYOGLOBIN in the last 168 hours.  Invalid input(s): CK  Micro Results Recent Results (from the past 240 hours)  Resp panel by RT-PCR (RSV, Flu A&B, Covid) Anterior Nasal Swab     Status: None   Collection Time: 05/27/24  7:49 PM   Specimen: Anterior Nasal Swab  Result Value Ref Range Status   SARS Coronavirus 2 by RT PCR NEGATIVE NEGATIVE Final    Influenza A by PCR NEGATIVE NEGATIVE Final   Influenza B by PCR NEGATIVE NEGATIVE Final    Comment: (NOTE) The Xpert Xpress SARS-CoV-2/FLU/RSV plus assay is intended as an aid in the diagnosis of influenza from Nasopharyngeal swab specimens and should not be used as a sole basis for treatment. Nasal washings and aspirates are unacceptable for Xpert Xpress SARS-CoV-2/FLU/RSV testing.  Fact Sheet for Patients: BloggerCourse.com  Fact Sheet for Healthcare Providers: SeriousBroker.it  This test is not yet approved or cleared by the [place]  FDA and has been authorized for detection and/or diagnosis of SARS-CoV-2 by FDA under an Emergency Use Authorization (EUA). This EUA will remain in effect (meaning this test can be used) for the duration of the COVID-19 declaration under Section 564(b)(1) of the Act, 21 U.S.C. section 360bbb-3(b)(1), unless the authorization is terminated or revoked.     Resp Syncytial Virus by PCR NEGATIVE NEGATIVE Final    Comment: (NOTE) Fact Sheet for Patients: BloggerCourse.com  Fact Sheet for Healthcare Providers: SeriousBroker.it  This test is not yet approved or cleared by the [place]  FDA and has been authorized for detection and/or diagnosis of SARS-CoV-2 by FDA under an Emergency Use Authorization (EUA). This EUA will remain in effect (meaning this test can be used) for the duration of the COVID-19 declaration under Section 564(b)(1) of the Act, 21 U.S.C. section 360bbb-3(b)(1), unless the authorization  is terminated or revoked.  Performed at [facility], [address] [place], [place] [address]   Blood Culture (routine x 2)     Status: None   Collection Time: 05/27/24  8:00 PM   Specimen: BLOOD LEFT ARM  Result Value Ref Range Status   Specimen Description BLOOD LEFT ARM  Final   Special Requests   Final    BOTTLES DRAWN  AEROBIC AND ANAEROBIC Blood Culture adequate volume   Culture   Final    NO GROWTH 5 DAYS Performed at [facility], [address], [place], [place] [address]    Report Status 06/01/2024 FINAL  Final  Blood Culture (routine x 2)     Status: None   Collection Time: 05/27/24  8:44 PM   Specimen: BLOOD RIGHT ARM  Result Value Ref Range Status   Specimen Description BLOOD RIGHT ARM  Final   Special Requests   Final    BOTTLES DRAWN AEROBIC AND ANAEROBIC Blood Culture adequate volume   Culture   Final    NO GROWTH 5 DAYS Performed at [facility], [address], [place], [place] [address]    Report Status 06/01/2024 FINAL  Final  Respiratory (~20 pathogens) panel by PCR     Status: Abnormal   Collection Time: 05/28/24  8:58 AM   Specimen: Nasopharyngeal Swab; Respiratory  Result Value Ref Range Status   Adenovirus NOT DETECTED NOT DETECTED Final   Coronavirus 229E NOT DETECTED NOT DETECTED Final    Comment: (NOTE) The Coronavirus on the Respiratory Panel, DOES NOT test for the novel  Coronavirus (2019 nCoV)    Coronavirus HKU1 NOT DETECTED NOT DETECTED Final   Coronavirus NL63 NOT DETECTED NOT DETECTED Final   Coronavirus OC43 NOT DETECTED NOT DETECTED Final   Metapneumovirus NOT DETECTED NOT DETECTED Final   Rhinovirus / Enterovirus DETECTED (A) NOT DETECTED Final   Influenza A NOT DETECTED NOT DETECTED Final   Influenza B NOT DETECTED NOT DETECTED Final   Parainfluenza Virus 1 NOT DETECTED NOT DETECTED Final   Parainfluenza Virus 2 NOT DETECTED NOT DETECTED Final   Parainfluenza Virus 3 NOT DETECTED NOT DETECTED Final   Parainfluenza Virus 4 NOT DETECTED NOT DETECTED Final   Respiratory Syncytial Virus NOT DETECTED NOT DETECTED Final   Bordetella pertussis NOT DETECTED NOT DETECTED Final   Bordetella Parapertussis NOT DETECTED NOT DETECTED Final   Chlamydophila pneumoniae NOT DETECTED NOT DETECTED Final   Mycoplasma pneumoniae NOT DETECTED NOT DETECTED Final     Comment: Performed at [facility], [address] [place], [place] [address]    Radiology Report CT ABDOMEN PELVIS W CONTRAST Result Date: 05/31/2024 EXAM: CT ABDOMEN AND PELVIS WITH CONTRAST 05/31/2024 06:57:38 AM TECHNIQUE: CT of the abdomen and pelvis was performed with the administration of intravenous contrast. Multiplanar reformatted images are provided for review. Automated exposure control, iterative reconstruction, and/or weight-based adjustment of the mA/kV was utilized to reduce the radiation dose to as low as reasonably achievable. COMPARISON: 05/28/2024 CLINICAL HISTORY: Peritonitis or perforation suspected; Oral and IV contrast. Pt only drank of oral contrast. FINDINGS: LOWER CHEST: New small right pleural effusion. Progressive consolidation within both lower lung zones with persistent ground glass and airspace densities in the right middle lobe and visualized portions of the right lower lung. LIVER: No suspicious focal liver lesion. GALLBLADDER AND BILE DUCTS: Pericholecystic edema is again noted with mild asymmetric gallbladder wall thickening likely reflecting secondary inflammation. This is similar  to the prior exam. No gallstones identified. Common bile duct is normal in caliber. SPLEEN: Normal size. No focal lesion. PANCREAS: No pancreatic mass, main duct dilatation or inflammation. ADRENAL GLANDS: Normal appearance. No mass. KIDNEYS, URETERS AND BLADDER: Wall thickening along the dome of bladder likely reflux secondary to inflammation, similar to prior exam. No stones in the kidneys or ureters. No hydronephrosis. No perinephric or periureteral stranding. GI AND BOWEL: Enteric contrast material is identified within the stomach and small bowel loops up to the level of the right lower quadrant ileostomy. Enterocolonic anastomosis at the level of the mid transverse colon (axial image 36). Contrast material is identified within the left colon. Extensive edema with surrounding  inflammatory change involving the descending duodenum and transverse duodenum (axial image 50/6). Compared with the previous exam, the extent of inflammatory changes within this area appears similar to decreased in the interval. Multiple foci of gas within the right upper quadrant of the abdomen in the area of edema and inflammatory change noted lateral and inferior to the descending duodenum, similar to the previous exam. Cannot exclude focal small bowel fistula. No pathologic dilatation of the bowel loops. Along the right paracolic gutter there is a soft tissue track containing oral contrast material which extends up to the inferior margin of the right lobe of liver and may communicate with the area of inflammatory change with loculated foci of gas (coronal image 37/6). When compared with the previous exam, the degree of contrast material within the soft tissue track appears decreased. PERITONEUM AND RETROPERITONEUM: Trace free fluid within the dependent portion of the pelvis. No free air. VASCULATURE: Aortic atherosclerosis. Patent upper abdominal vascularity. LYMPH NODES: No lymphadenopathy. REPRODUCTIVE ORGANS: No significant abnormality. 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 No acute osseous abnormality. IMPRESSION: 1. Extensive edema and inflammatory change involving the descending and transverse duodenum, similar to prior exam. 2. Soft tissue track containing oral contrast material along the right paracolic gutter, possibly communicating with the area of inflammatory change and loculated gas. The degree of contrast material within the soft tissue track appears decreased compared to the prior exam. This soft tissue tract is suspicious for an underlying small bowel fistula and I suspect likely accounts for the persistent inflammation and loculated gas around the descending duodenum  and gallbladder. 3. Mild asymmetric gallbladder wall thickening and pericholecystic edema, similar to prior exam, likely reflecting secondary inflammation. No gallstones identified. 4. Wall thickening along the dome of the bladder, similar to prior exam, likely secondary to inflammation. 5. New small right pleural effusion and progressive consolidation within both lower lung zones. Electronically signed by: Waddell Calk MD 05/31/2024 10:30 AM EDT RP Workstation: [id number]     Signature  -   Lavada Stank M.D on 06/01/2024 at 7:52 AM   -  To page go to [email or web address]

## 2024-06-01 NOTE — Progress Notes (Signed)
 PHARMACY - ANTICOAGULATION CONSULT NOTE  Pharmacy Consult:  Heparin  Indication: VTE treatment (warfarin PTA on hold)  No Known Allergies  Patient Measurements: Height: 5' 9 (175.3 cm) Weight: 64 kg (141 lb 1.5 oz) IBW/kg (Calculated) : 70.7 HEPARIN  DW (KG): 68  Vital Signs: Temp: 98.5 F (36.9 C) (09/01 0503) Temp Source: Oral (09/01 0503) BP: 146/78 (09/01 0503) Pulse Rate: 60 (09/01 0503)  Labs: Recent Labs    05/30/24 0055 05/30/24 0943 05/30/24 1816 05/31/24 0156 06/01/24 0252  HGB 7.7*  --   --  7.6* 8.1*  HCT 24.4*  --   --  24.1* 25.6*  PLT 250  --   --  268 293  LABPROT 15.8*  --   --  15.1 13.7  INR 1.2  --   --  1.1 1.0  HEPARINUNFRC 0.15*   < > 0.46 0.40 0.50  CREATININE 0.65  --   --  0.54* 0.53*   < > = values in this interval not displayed.    Estimated Creatinine Clearance: 86.7 mL/min (A) (by C-G formula based on SCr of 0.53 mg/dL (L)).  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 Pharmacy consulted for heparin  gtt for bridge while patient undergoes evaluation for possible procedure.   Heparin  level remains therapeutic (0.50) on infusion at 2200 units/hr. No documented issues with heparin  infusion or signs/symptoms of bleeding. Hgb stable, platelets within normal limits.  Goal of Therapy:  Heparin  level 0.3-0.7 units/ml Monitor platelets by anticoagulation protocol: Yes   Plan:  Continue heparin  infusion at 2200 units/hr Daily heparin  level and CBC Monitor for signs and symptoms of bleeding   Dionicia Canavan, PharmD, RPh PGY1 Acute Care Pharmacy Resident [facility]  06/01/2024 7:31 AM

## 2024-06-02 ENCOUNTER — Ambulatory Visit (HOSPITAL_COMMUNITY): Admitting: Nurse Practitioner

## 2024-06-02 DIAGNOSIS — K668 Other specified disorders of peritoneum: Secondary | ICD-10-CM | POA: Diagnosis not present

## 2024-06-02 LAB — CBC WITH DIFFERENTIAL/PLATELET
Basophils Absolute: 0.1 K/uL (ref 0.0–0.1)
Basophils Relative: 1 %
Eosinophils Absolute: 0.1 K/uL (ref 0.0–0.5)
Eosinophils Relative: 1 %
HCT: 25.3 % — ABNORMAL LOW (ref 39.0–52.0)
Hemoglobin: 7.8 g/dL — ABNORMAL LOW (ref 13.0–17.0)
Lymphocytes Relative: 13 %
Lymphs Abs: 0.8 K/uL (ref 0.7–4.0)
MCH: 29.8 pg (ref 26.0–34.0)
MCHC: 30.8 g/dL (ref 30.0–36.0)
MCV: 96.6 fL (ref 80.0–100.0)
Monocytes Absolute: 0.4 K/uL (ref 0.1–1.0)
Monocytes Relative: 7 %
Neutro Abs: 4.8 K/uL (ref 1.7–7.7)
Neutrophils Relative %: 78 %
Platelets: 292 K/uL (ref 150–400)
RBC: 2.62 MIL/uL — ABNORMAL LOW (ref 4.22–5.81)
RDW: 16.4 % — ABNORMAL HIGH (ref 11.5–15.5)
WBC: 6.1 K/uL (ref 4.0–10.5)
nRBC: 0.3 % — ABNORMAL HIGH (ref 0.0–0.2)

## 2024-06-02 LAB — PROCALCITONIN: Procalcitonin: 0.29 ng/mL

## 2024-06-02 LAB — COMPREHENSIVE METABOLIC PANEL WITH GFR
ALT: 9 U/L (ref 0–44)
AST: 12 U/L — ABNORMAL LOW (ref 15–41)
Albumin: 2.3 g/dL — ABNORMAL LOW (ref 3.5–5.0)
Alkaline Phosphatase: 44 U/L (ref 38–126)
Anion gap: 10 (ref 5–15)
BUN: 8 mg/dL (ref 8–23)
CO2: 26 mmol/L (ref 22–32)
Calcium: 8.7 mg/dL — ABNORMAL LOW (ref 8.9–10.3)
Chloride: 102 mmol/L (ref 98–111)
Creatinine, Ser: 0.62 mg/dL (ref 0.61–1.24)
GFR, Estimated: >60 mL/min (ref >60)
Glucose, Bld: 128 mg/dL — ABNORMAL HIGH (ref 70–99)
Potassium: 4.1 mmol/L (ref 3.5–5.1)
Sodium: 138 mmol/L (ref 135–145)
Total Bilirubin: 0.3 mg/dL (ref 0.0–1.2)
Total Protein: 6.6 g/dL (ref 6.5–8.1)

## 2024-06-02 LAB — HEPARIN LEVEL (UNFRACTIONATED): Heparin Unfractionated: 0.58 [IU]/mL (ref 0.30–0.70)

## 2024-06-02 LAB — C-REACTIVE PROTEIN: CRP: 4.2 mg/dL — ABNORMAL HIGH

## 2024-06-02 LAB — MAGNESIUM: Magnesium: 1.9 mg/dL (ref 1.7–2.4)

## 2024-06-02 LAB — PHOSPHORUS: Phosphorus: 4.5 mg/dL (ref 2.5–4.6)

## 2024-06-02 MED ORDER — ACETAMINOPHEN 325 MG PO TABS
650.0000 mg | ORAL_TABLET | Freq: Four times a day (QID) | ORAL | Status: DC | PRN
  Administered 2024-06-02 – 2024-06-11 (×20): 650 mg via ORAL
  Filled 2024-06-02 (×20): qty 2

## 2024-06-02 MED ORDER — TRAVASOL 10 % IV SOLN
INTRAVENOUS | Status: AC
  Filled 2024-06-02: qty 1060.8

## 2024-06-02 NOTE — Progress Notes (Signed)
 [facility] Progress Note     Subjective: CC:  NAEO. Hungry and wants to eat. Says he had grits yesterday. Denies nausea or vomiting. Asking me when I think his ileostomy will be reversed. Says he was staying a [facility] before readmission.   Objective: Vital signs in last 24 hours: Temp:  [97.6 F (36.4 C)-98.3 F (36.8 C)] 97.6 F (36.4 C) (09/02 0843) Pulse Rate:  [75-80] 77 (09/02 0843) Resp:  [13-18] 13 (09/02 0843) BP: (100-122)/(55-77) 122/63 (09/02 0843) SpO2:  [96 %-99 %] 99 % (09/01 2325) Last BM Date : 06/01/24  Intake/Output from previous day: 09/01 0701 - 09/02 0700 In: -  Out: 2775 [Urine:2225; Stool:550] Intake/Output this shift: Total I/O In: -  Out: 350 [Urine:350]  PE: Gen:  Alert, NAD, pleasant Card:  Regular rate and rhythm Pulm:  Normal effort ORA Abd: Soft, non-tender, non-distended, midline incisions with interval formation of granulation tissue, some purulence on dressing. Ileostomy with watery stool (550 documented and bag currently 2/3 full) Skin: warm and dry, no rashes  Psych: A&Ox3   Lab Results:  Recent Labs    06/01/24 0252 06/02/24 0332  WBC 5.6 6.1  HGB 8.1* 7.8*  HCT 25.6* 25.3*  PLT 293 292   BMET Recent Labs    06/01/24 0252 06/02/24 0332  NA 134* 138  K 4.4 4.1  CL 101 102  CO2 28 26  GLUCOSE 116* 128*  BUN <5* 8  CREATININE 0.53* 0.62  CALCIUM  8.7* 8.7*   PT/INR Recent Labs    05/31/24 0156 06/01/24 0252  LABPROT 15.1 13.7  INR 1.1 1.0   CMP     Component Value Date/Time   NA 138 06/02/2024 0332   NA 141 01/22/2020 1137   K 4.1 06/02/2024 0332   CL 102 06/02/2024 0332   CO2 26 06/02/2024 0332   GLUCOSE 128 (H) 06/02/2024 0332   BUN 8 06/02/2024 0332   BUN 13 01/22/2020 1137   CREATININE 0.62 06/02/2024 0332   CREATININE 0.98 06/14/2017 0935   CALCIUM  8.7 (L) 06/02/2024 0332   PROT 6.6 06/02/2024 0332   PROT 7.3 01/22/2020 1137   ALBUMIN  2.3 (L) 06/02/2024 0332   ALBUMIN  4.2  01/22/2020 1137   AST 12 (L) 06/02/2024 0332   ALT 9 06/02/2024 0332   ALKPHOS 44 06/02/2024 0332   BILITOT 0.3 06/02/2024 0332   BILITOT 0.4 01/22/2020 1137   GFRNONAA >60 06/02/2024 0332   GFRNONAA >89 09/17/2014 1253   GFRAA 111 01/22/2020 1137   GFRAA >89 09/17/2014 1253   Lipase     Component Value Date/Time   LIPASE 28 04/03/2024 0200       Studies/Results: No results found.  Anti-infectives: Anti-infectives (From admission, onward)    Start     Dose/Rate Route Frequency Ordered Stop   05/29/24 1330  linezolid  (ZYVOX ) IVPB 600 mg        600 mg 300 mL/hr over 60 Minutes Intravenous Every 12 hours 05/29/24 1230     05/28/24 2000  piperacillin -tazobactam (ZOSYN ) IVPB 3.375 g        3.375 g 12.5 mL/hr over 240 Minutes Intravenous Every 8 hours 05/28/24 0534     05/28/24 0900  metroNIDAZOLE  (FLAGYL ) IVPB 500 mg        500 mg 100 mL/hr over 60 Minutes Intravenous  Once 05/28/24 0534 05/28/24 1104   05/28/24 0630  DAPTOmycin  (CUBICIN ) IVPB 700 mg/182mL premix        10 mg/kg  68 kg  200 mL/hr over 30 Minutes Intravenous  Once 05/28/24 9372 05/28/24 0754   05/27/24 1945  cefTRIAXone  (ROCEPHIN ) 2 g in sodium chloride  0.9 % 100 mL IVPB        2 g 200 mL/hr over 30 Minutes Intravenous Once 05/27/24 1934 05/27/24 2125   05/27/24 1945  metroNIDAZOLE  (FLAGYL ) IVPB 500 mg        500 mg 100 mL/hr over 60 Minutes Intravenous  Once 05/27/24 1934 05/27/24 2236        Assessment/Plan  55M with complex medical and surgical history, most recent surgery 04/13/24 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 Tanda Regal bowel inflammation with concern for possible perforation - known hostile abdomen - nausea and vomiting, small bowel enteritis and small volume pneumoperitoneum in the RUQ on CT 8/27 - CT 8/28 with concern for inflamed small bowel loop on the right with concern for contained perforation but also possible contrast leak vs  contrast within decompressed loop of small  bowel  - SBFT 8/28 without contrast extravasation  - CT 8/31 - same to slightly improved.  Tolerating FLD and I suspect he is already eating some solids -- looked like he had cracker crumbs in his bed. Advance to soft diet and monitor.  - continue PICC and TPN  - continue abx - pt remains hemodynamically stable, WBC WNL  - hgb 7.8 from 8.7, continue to monitor      FEN: advance to soft diet, PICC and TPN, IVF per TR[facility] VTE: heparin  gtt ID: Zosyn  8/28>>   - per [facility] -  Hx of PE/DVT Pulmonary HTN PAD HTN GERD - PPI Gout   LOS: 5 days   I reviewed nursing notes, hospitalist notes, last 24 h vitals and pain scores, last 48 h intake and output, last 24 h labs and trends, and last 24 h imaging results.  This care required moderate level of medical decision making.   Almarie Pringle, PA-C [facility] Please see Amion for pager number during day hours 7:00am-4:30pm

## 2024-06-02 NOTE — Consult Note (Signed)
 WOC Nurse ostomy follow up Pouch intact that was applied 05/29/24.  Patient is pleased with ostomy pouching.  He is displeased with not eating.  He has called family members to bring food and has voiced stress over not being allowed to eat.  We discuss the complications of eating with a bowel perforation.  He declares,  I know and the discussion is over.   Stoma type/location:  RLQ ileostomy, flush pink and moist  Stomal assessment/size: 1 pink and moist  liquid output in pouch, emptied.  Peristomal assessment:  Skin is intact, creasing at 3:00 and minimally at 9:00.  This is filled in with pieces of barrier ring.  Will use Eakin barrier seal around stoma and if skin still intact on Friday, will switch back to just barrier ring around stoma, but continue the pieces of ring in the creases first.  Coloplast 2 piece convex is applied along with ostomy belt for added security.   Supplies at bedside in case of leak.  Will see again Friday if inpatient. If he is discharged prior to Friday, will see in clinic Friday.  Treatment options for stomal/peristomal skin:  Barrier strip pieces to creasing.  Eakin seal or barrier ring around stoma.  Output liquid green stool.  TPN is running Ostomy pouching: 2pc. Coloplast flexible with belt.   Education provided: Discussed steps of pouch change.  Patient is minimally participative but states she knows how to change pouch when needed.  Enrolled patient in [place] Secure Start Discharge program: Yes but using Coloplast  Is enrolled with [employer] and supplies have been delivered, he states.  Will follow.  Darice Cooley MSN, RN, FNP-BC CWON Wound, Ostomy, Continence Nurse Outpatient [facility] [phone] Work cell phone:  [phone]

## 2024-06-02 NOTE — TOC Progression Note (Signed)
 Transition of Care [facility]) - Progression Note    Patient Details  Name: Alexander [name] MRN: [id number] Date of Birth: April 14, 1961  [facility] CM/SW Contact  Inocente GORMAN Kindle, LCSW Phone Number: 06/02/2024, 2:23 PM  Clinical Narrative:    CSW continuing to follow.    Expected Discharge Plan: Skilled Nursing Facility Barriers to Discharge: Continued Medical Work up, English as a second language teacher               Expected Discharge Plan and Services In-house Referral: Clinical Social Work   Post Acute Care Choice: Skilled Nursing Facility Living arrangements for the past 2 months: Single Family Home, Skilled Nursing Facility                                       Social Drivers of Health (SDOH) Interventions SDOH Screenings   Food Insecurity: Patient Unable To Answer (05/28/2024)  Housing: Unknown (05/28/2024)  Recent Concern: Housing - High Risk (04/10/2024)  Transportation Needs: Patient Unable To Answer (05/28/2024)  Recent Concern: Transportation Needs - Unmet Transportation Needs (03/30/2024)  Utilities: Not At Risk (05/28/2024)  Depression (PHQ2-9): Low Risk  (07/24/2023)  Financial Resource Strain: Low Risk  (05/01/2024)   Received from [facility]  Social Connections: Moderately Isolated (05/01/2024)   Received from [facility]  Stress: No Stress Concern Present (05/13/2024)   Received from [facility]  Tobacco Use: Medium Risk (05/27/2024)    Readmission Risk Interventions    05/28/2024    2:04 PM 04/30/2024   12:54 PM 04/10/2024    1:55 PM  Readmission Risk Prevention Plan  Transportation Screening Complete Complete Complete  Medication Review Oceanographer) Complete Complete Complete  PCP or Specialist appointment within 3-5 days of discharge Complete Complete Complete  HRI or Home Care Consult Complete Complete Complete  SW Recovery Care/Counseling Consult Complete Complete Complete  Palliative Care Screening Not Applicable Not Applicable  Not Applicable  Skilled Nursing Facility Complete Not Applicable Not Applicable

## 2024-06-02 NOTE — Progress Notes (Signed)
 PHARMACY - ANTICOAGULATION CONSULT NOTE  Pharmacy Consult:  Heparin  Indication: VTE treatment (warfarin PTA on hold)  No Known Allergies  Patient Measurements: Height: 5' 9 (175.3 cm) Weight: 64 kg (141 lb 1.5 oz) IBW/kg (Calculated) : 70.7 HEPARIN  DW (KG): 68  Vital Signs: Temp: 97.6 F (36.4 C) (09/02 0843) Temp Source: Oral (09/02 0843) BP: 122/63 (09/02 0843) Pulse Rate: 77 (09/02 0843)  Labs: Recent Labs    05/31/24 0156 06/01/24 0252 06/02/24 0332  HGB 7.6* 8.1* 7.8*  HCT 24.1* 25.6* 25.3*  PLT 268 293 292  LABPROT 15.1 13.7  --   INR 1.1 1.0  --   HEPARINUNFRC 0.40 0.50 0.58  CREATININE 0.54* 0.53* 0.62    Estimated Creatinine Clearance: 86.7 mL/min (by C-G formula based on SCr of 0.62 mg/dL).  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 Pharmacy consulted for heparin  gtt for bridge while patient undergoes evaluation for possible procedure.   Heparin  level remains therapeutic on infusion at 2200 units/hr.   Goal of Therapy:  Heparin  level 0.3-0.7 units/ml Monitor platelets by anticoagulation protocol: Yes   Plan:  Continue heparin  infusion at 2200 units/hr Daily heparin  level and CBC Monitor for signs and symptoms of bleeding   Thank you. Olam Monte, PharmD   06/02/2024 9:28 AM

## 2024-06-02 NOTE — Progress Notes (Signed)
 PROGRESS NOTE                                                                                                                                                                                                             Patient Demographics:    Alexander [name], is a 63 y.o. male, DOB - [birth year], FMW:[id number]  Outpatient Primary MD for the patient is [name], [name]    LOS - 5  Admit date - 05/27/2024    Chief Complaint  Patient presents with   Abdominal Pain   Nausea   Weakness   Emesis       Brief Narrative (HPI from H&P)   63 y.o. male with complex hx in 4/'25 developed colonic perforation I/s/o diverticulitis requiring emergent ex lap initially left in discontinuity and required additional subsequent SBR and ileostomy, complicated postoperative course including cardiac arrest, shock, renal failure requiring CRRT, intrabdominal abscess requiring drainage including VRE intraabdominal infection (S to Daptomycin ), ultimately discharged to [facility], and readmitted in 7/'25 with wound dehiscence and underwent ex lap 7/14 with extensive LOA, sigmoid colectomy and ileocolonic anastamosis and creation of a loop ileostomy, and 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 Presented with acute onset of N/V, abd pain, imaging was suggestive of some free air in the abdomen which probably contained perforation, seen by general surgery and admitted to the hospital.   Subjective:   Patient in bed, appears comfortable, denies any headache, no fever, no chest pain or pressure, no shortness of breath, improved abdominal pain. No focal weakness.    Assessment  & Plan :   Sepsis, nausea vomiting, secondary to possible contained intra-abdominal perforation.  Patient with history of multiple abdominal surgeries and recent  intra-abdominal abscess, has ileostomy.      He has been seen by general surgery, he has complicated history of multiple abdominal surgeries, recent intra-abdominal abscess, ileostomy.  There is suspicion of contained perforation, he has been treated conservatively with bowel rest, IV fluids, TNA, pain control and antibiotics.  Will defer management of this issue to general surgery who is following the patient.  Placed on clear liquids on 05/31/2024, defer diet advancement to general surgery.  Patient has been noncompliant with his dietary restrictions despite being counseled multiple times,  fortunately he is clinically improving.  Note patient has chronic pain and is on high doses of narcotics at home, use with caution as blood pressure borderline low.  Sepsis pathophysiology is improving, blood cultures negative final.    Prolonged Qtc  Avoid QT prolonging medications, replace electrolytes, monitor on telemetry.   Incidental rhinovirus URI.  Asymptomatic.  Monitor.    Hypomagnesemia and hypokalemia.  Replaced.    Chronic medical problems: Reintroduce oral medications as he is more consistent with oral diet.  Complex Surgical Hx per above, noted   DVT/PE: Hold Warfarin in case requires surgery. INR < 2 at present. Trend daily. Continue  Heparin  drip.  Diastolic HF: Without exacerbation, dry clinically.   HTN: not on antiHTN at time of admission   Anemia: Recent baseline ~ 8, hemoconcentrated on admission anticipate will drop towards 8. Holding iron , type screen and monitor.  Chronic pain: Home regimen is Dilaudid  2 mg PO q 4 hr prn, see acute pain management above. Holding home Gabapentin .   ?insomnia/mood: holding trazodone    ?? ASCVD: Hold aspirin ; unclear indication since on Warfarin per above      Condition - Extremely Guarded  Family Communication  : None present  Code Status : Full code  Consults  : General surgery  PUD Prophylaxis : PPI   Procedures  :     Small  bowel follow-through 05/28/2024.  No extravasation of contrast.    CT 05/28/2024- 1. Findings which remain concerning for the presence of contained free air within the mid right abdomen and right upper quadrant, as described above. 2. Markedly inflamed, partially opacified small bowel loops within the mid and upper right abdomen. 3. Curvilinear area of oral contrast within the lateral aspect of the mid to lower right abdomen which may be within a decompressed small bowel loop, however, intraluminal location cannot completely be confirmed. 4. Stable moderate to marked severity areas of atelectasis and/or infiltrate within the bilateral lower lobes. 5. Colonic diverticulosis. 6. Aortic atherosclerosis.      Disposition Plan  :    Status is: Inpatient  DVT Prophylaxis  :  Hep gtt  SCDs Start: 05/28/24 0530    Lab Results  Component Value Date   PLT 292 06/02/2024    Diet :  Diet Order             Diet full liquid Room service appropriate? Yes; Fluid consistency: Thin  Diet effective now                    Inpatient Medications  Scheduled Meds:  Chlorhexidine  Gluconate Cloth  6 each Topical Daily   feeding supplement  237 mL Oral BID BM   gabapentin   200 mg Oral TID   pantoprazole  (PROTONIX ) IV  40 mg Intravenous Q12H   sodium chloride  flush  10-40 mL Intracatheter Q12H   sodium chloride  flush  3 mL Intravenous Q12H   thiamine  (VITAMIN B1) injection  100 mg Intravenous Daily   traZODone   100 mg Oral QHS   Continuous Infusions:  heparin  2,200 Units/hr (06/02/24 0145)   linezolid  (ZYVOX ) IV 600 mg (06/01/24 2358)   piperacillin -tazobactam (ZOSYN )  IV 3.375 g (06/02/24 0412)   TPN ADULT (ION) 85 mL/hr at 06/01/24 1725   PRN Meds:.acetaminophen , albuterol , morphine  injection **OR** morphine  injection, sodium chloride  flush, trimethobenzamide     Objective:   Vitals:   06/01/24 1147 06/01/24 1545 06/01/24 1944 06/01/24 2325  BP: (!) 104/55 (!) 114/56 108/67 100/77   Pulse:  75 75 80  Resp:  13 17 18   Temp: 97.9 F (36.6 C) 98.3 F (36.8 C) 98 F (36.7 C) 98.1 F (36.7 C)  TempSrc: Oral Oral Oral Oral  SpO2:  96% 97% 99%  Weight:      Height:        Wt Readings from Last 3 Encounters:  05/30/24 64 kg  04/29/24 55 kg  04/06/24 71 kg     Intake/Output Summary (Last 24 hours) at 06/02/2024 0804 Last data filed at 06/02/2024 9374 Gross per 24 hour  Intake --  Output 2475 ml  Net -2475 ml     Physical Exam  Awake Alert, No new F.N deficits, Normal affect [place].AT,PERRAL Supple Neck, No JVD,   Symmetrical Chest wall movement, Good air movement bilaterally, CTAB RRR,No Gallops,Rubs or new Murmurs,  +ve B.Sounds, Abd Soft, ileostomy present, tenderness throughout the abdomen mostly in the upper quadrants No Cyanosis, Clubbing or edema        Data Review:    Recent Labs  Lab 05/27/24 2000 05/28/24 0612 05/29/24 0422 05/30/24 0055 05/31/24 0156 06/01/24 0252 06/02/24 0332  WBC 23.4*   < > 11.9* 7.6 5.5 5.6 6.1  HGB 11.1*   < > 8.7* 7.7* 7.6* 8.1* 7.8*  HCT 35.3*   < > 26.6* 24.4* 24.1* 25.6* 25.3*  PLT 438*   < > 266 250 268 293 292  MCV 95.7   < > 92.4 95.3 94.5 95.2 96.6  MCH 30.1   < > 30.2 30.1 29.8 30.1 29.8  MCHC 31.4   < > 32.7 31.6 31.5 31.6 30.8  RDW 16.7*   < > 16.3* 16.1* 15.9* 16.0* 16.4*  LYMPHSABS 1.4  --   --  0.6* 0.9 1.2 0.8  MONOABS 1.4*  --   --  0.3 0.5 0.6 0.4  EOSABS 0.0  --   --  0.1 0.1 0.1 0.1  BASOSABS 0.1  --   --  0.0 0.0 0.0 0.1   < > = values in this interval not displayed.    Recent Labs  Lab 05/27/24 2003 05/27/24 2159 05/28/24 0612 05/28/24 1801 05/29/24 0422 05/30/24 0055 05/31/24 0156 06/01/24 0252 06/02/24 0332  NA  --   --  136  --  135 133* 134* 134* 138  K  --   --  3.6  --  3.3* 3.2* 3.7 4.4 4.1  CL  --   --  103  --  102 101 102 101 102  CO2  --   --  24  --  22 24 24 28 26   ANIONGAP  --   --  9  --  11 8 8 5 10   GLUCOSE  --   --  92  --  88 174* 121* 116* 128*  BUN  --    --  9  --  8 8 7* <5* 8  CREATININE  --   --  0.83  --  0.73 0.65 0.54* 0.53* 0.62  AST  --   --   --   --  13* 19 12* 12* 12*  ALT  --   --   --   --  11 12 9 10 9   ALKPHOS  --   --   --   --  69 54 48 49 44  BILITOT  --   --   --   --  0.8 0.3 0.2 0.2 0.3  ALBUMIN   --   --   --   --  2.1* 2.0* 1.9* 2.1* 2.3*  CRP  --   --   --   --   --  20.6* 10.7* 7.4* 4.2*  PROCALCITON  --   --   --   --  3.23 2.05 1.08 0.53 0.29  LATICACIDVEN 1.3 1.6 1.4  --   --   --   --   --   --   INR  --   --  2.2* 1.6*  --  1.2 1.1 1.0  --   MG  --   --  1.2*  --  1.6* 1.8 1.7 1.8  --   PHOS  --   --  3.3  --  3.7 2.9 3.6 5.2*  --   CALCIUM   --   --  9.1  --  8.8* 8.3* 8.4* 8.7* 8.7*      Recent Labs  Lab 05/27/24 2003 05/27/24 2159 05/28/24 0612 05/28/24 1801 05/29/24 0422 05/30/24 0055 05/31/24 0156 06/01/24 0252 06/02/24 0332  CRP  --   --   --   --   --  20.6* 10.7* 7.4* 4.2*  PROCALCITON  --   --   --   --  3.23 2.05 1.08 0.53 0.29  LATICACIDVEN 1.3 1.6 1.4  --   --   --   --   --   --   INR  --   --  2.2* 1.6*  --  1.2 1.1 1.0  --   MG  --   --  1.2*  --  1.6* 1.8 1.7 1.8  --   CALCIUM   --   --  9.1  --  8.8* 8.3* 8.4* 8.7* 8.7*    --------------------------------------------------------------------------------------------------------------- Lab Results  Component Value Date   CHOL 114 12/03/2023   HDL 43.40 12/03/2023   LDLCALC 58 12/03/2023   TRIG 45 06/01/2024   CHOLHDL 3 12/03/2023    Lab Results  Component Value Date   HGBA1C 5.9 12/03/2023   No results for input(s): TSH, T4TOTAL, FREET4, T3FREE, THYROIDAB in the last 72 hours. No results for input(s): VITAMINB12, FOLATE, FERRITIN, TIBC, IRON , RETICCTPCT in the last 72 hours. ------------------------------------------------------------------------------------------------------------------ Cardiac Enzymes No results for input(s): CKMB, TROPONINI, MYOGLOBIN in the last 168 hours.  Invalid  input(s): CK  Micro Results Recent Results (from the past 240 hours)  Resp panel by RT-PCR (RSV, Flu A&B, Covid) Anterior Nasal Swab     Status: None   Collection Time: 05/27/24  7:49 PM   Specimen: Anterior Nasal Swab  Result Value Ref Range Status   SARS Coronavirus 2 by RT PCR NEGATIVE NEGATIVE Final   Influenza A by PCR NEGATIVE NEGATIVE Final   Influenza B by PCR NEGATIVE NEGATIVE Final    Comment: (NOTE) The Xpert Xpress SARS-CoV-2/FLU/RSV plus assay is intended as an aid in the diagnosis of influenza from Nasopharyngeal swab specimens and should not be used as a sole basis for treatment. Nasal washings and aspirates are unacceptable for Xpert Xpress SARS-CoV-2/FLU/RSV testing.  Fact Sheet for Patients: BloggerCourse.com  Fact Sheet for Healthcare Providers: SeriousBroker.it  This test is not yet approved or cleared by the [place]  FDA and has been authorized for detection and/or diagnosis of SARS-CoV-2 by FDA under an Emergency Use Authorization (EUA). This EUA will remain in effect (meaning this test can be used) for the duration of the COVID-19 declaration under Section 564(b)(1) of the Act, 21 U.S.C. section 360bbb-3(b)(1), unless the authorization is terminated or revoked.     Resp Syncytial Virus by  PCR NEGATIVE NEGATIVE Final    Comment: (NOTE) Fact Sheet for Patients: BloggerCourse.com  Fact Sheet for Healthcare Providers: SeriousBroker.it  This test is not yet approved or cleared by the [place]  FDA and has been authorized for detection and/or diagnosis of SARS-CoV-2 by FDA under an Emergency Use Authorization (EUA). This EUA will remain in effect (meaning this test can be used) for the duration of the COVID-19 declaration under Section 564(b)(1) of the Act, 21 U.S.C. section 360bbb-3(b)(1), unless the authorization is terminated  or revoked.  Performed at [facility], [address], [place], [place] [address]   Blood Culture (routine x 2)     Status: None   Collection Time: 05/27/24  8:00 PM   Specimen: BLOOD LEFT ARM  Result Value Ref Range Status   Specimen Description BLOOD LEFT ARM  Final   Special Requests   Final    BOTTLES DRAWN AEROBIC AND ANAEROBIC Blood Culture adequate volume   Culture   Final    NO GROWTH 5 DAYS Performed at [facility], [address] [place], [place] [address]    Report Status 06/01/2024 FINAL  Final  Blood Culture (routine x 2)     Status: None   Collection Time: 05/27/24  8:44 PM   Specimen: BLOOD RIGHT ARM  Result Value Ref Range Status   Specimen Description BLOOD RIGHT ARM  Final   Special Requests   Final    BOTTLES DRAWN AEROBIC AND ANAEROBIC Blood Culture adequate volume   Culture   Final    NO GROWTH 5 DAYS Performed at [facility], [address] [place], [place] [address]    Report Status 06/01/2024 FINAL  Final  Respiratory (~20 pathogens) panel by PCR     Status: Abnormal   Collection Time: 05/28/24  8:58 AM   Specimen: Nasopharyngeal Swab; Respiratory  Result Value Ref Range Status   Adenovirus NOT DETECTED NOT DETECTED Final   Coronavirus 229E NOT DETECTED NOT DETECTED Final    Comment: (NOTE) The Coronavirus on the Respiratory Panel, DOES NOT test for the novel  Coronavirus (2019 nCoV)    Coronavirus HKU1 NOT DETECTED NOT DETECTED Final   Coronavirus NL63 NOT DETECTED NOT DETECTED Final   Coronavirus OC43 NOT DETECTED NOT DETECTED Final   Metapneumovirus NOT DETECTED NOT DETECTED Final   Rhinovirus / Enterovirus DETECTED (A) NOT DETECTED Final   Influenza A NOT DETECTED NOT DETECTED Final   Influenza B NOT DETECTED NOT DETECTED Final   Parainfluenza Virus 1 NOT DETECTED NOT DETECTED Final   Parainfluenza Virus 2 NOT DETECTED NOT DETECTED Final   Parainfluenza Virus 3 NOT DETECTED NOT DETECTED Final   Parainfluenza Virus 4  NOT DETECTED NOT DETECTED Final   Respiratory Syncytial Virus NOT DETECTED NOT DETECTED Final   Bordetella pertussis NOT DETECTED NOT DETECTED Final   Bordetella Parapertussis NOT DETECTED NOT DETECTED Final   Chlamydophila pneumoniae NOT DETECTED NOT DETECTED Final   Mycoplasma pneumoniae NOT DETECTED NOT DETECTED Final    Comment: Performed at [facility], [address], [place], [place] [address]    Radiology Report No results found.    Signature  -   Lavada Stank M.D on 06/02/2024 at 8:04 AM   -  To page go to [email or web address]

## 2024-06-02 NOTE — Progress Notes (Signed)
 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 Usual Weight: 68.2 Kg  Assessment:  63 yo M with complex surgical history including 12/2023 developed colonic perforation I/s/o diverticulitis requiring emergent ex lap initially left in discontinuity and required additional subsequent SBR and ileostomy, complicated postoperative course including cardiac arrest, shock, renal failure requiring CRRT, intrabdominal abscess requiring drainage including VRE intraabdominal infection. Most recent surgery 04/13/24 with ileocolic anastomosis and diverting loop ileostomy for fascial dehiscence. Admitted 8/27 with NVD and concern for perforated small bowel. Pharmacy consulted for TPN.  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 Tanda - return 9/27 for further eval of abd wound dehiscence  Central access: PICC placed 05/29/2024 TPN start date: 05/29/2024  Nutritional Goals: Goal TPN rate is 85 mL/hr (provides 107g AA and 2117 kcals per day)  RD Estimated Needs Total Energy Estimated Needs: 2100-2300 Total Protein Estimated Needs: 105-125 grams Total Fluid Estimated Needs: >/= 2 L  Current Nutrition:  TPN Advance to soft diet Ensure Plus High Protein BID - none  charted given  Plan:  Continue TPN at goal rate of 85 mL/hr to provide 100% of needs  Electrolytes in TPN: increase Na 125 mEq/L on 9/1, decrease K 45 mEq/L on 9/1, Ca 2 mEq/L, increase Mg 10 mEq/L and decrease Phos 10 mmol/L on 9/1, Cl:Ac 1:1 Add standard MVI and trace elements to TPN Thiamine  100mg  IV daily x 5 days  Monitor TPN labs on Mon/Thurs - next 9/4 F/u PO intake, wean TPN as able   Yulieth Carrender D. Lendell, PharmD, BCPS, BCCCP 06/02/2024, 10:36 AM

## 2024-06-02 NOTE — Progress Notes (Signed)
 Patient requested several packs of graham crackers, RN educated patient that he is on liquid diet and is not suppose to be eating graham crackers, patient advised that he has been eating graham and saltine crackers ever since he was admitted to the hospital

## 2024-06-03 DIAGNOSIS — K668 Other specified disorders of peritoneum: Secondary | ICD-10-CM | POA: Diagnosis not present

## 2024-06-03 LAB — COMPREHENSIVE METABOLIC PANEL WITH GFR
ALT: 11 U/L (ref 0–44)
AST: 14 U/L — ABNORMAL LOW (ref 15–41)
Albumin: 2.3 g/dL — ABNORMAL LOW (ref 3.5–5.0)
Alkaline Phosphatase: 48 U/L (ref 38–126)
Anion gap: 10 (ref 5–15)
BUN: 8 mg/dL (ref 8–23)
CO2: 25 mmol/L (ref 22–32)
Calcium: 8.9 mg/dL (ref 8.9–10.3)
Chloride: 104 mmol/L (ref 98–111)
Creatinine, Ser: 0.7 mg/dL (ref 0.61–1.24)
GFR, Estimated: >60 mL/min (ref >60)
Glucose, Bld: 119 mg/dL — ABNORMAL HIGH (ref 70–99)
Potassium: 4.3 mmol/L (ref 3.5–5.1)
Sodium: 139 mmol/L (ref 135–145)
Total Bilirubin: 0.3 mg/dL (ref 0.0–1.2)
Total Protein: 6.6 g/dL (ref 6.5–8.1)

## 2024-06-03 LAB — CBC WITH DIFFERENTIAL/PLATELET
Basophils Absolute: 0 K/uL (ref 0.0–0.1)
Basophils Relative: 0 %
Eosinophils Absolute: 0 K/uL (ref 0.0–0.5)
Eosinophils Relative: 0 %
HCT: 24.6 % — ABNORMAL LOW (ref 39.0–52.0)
Hemoglobin: 7.6 g/dL — ABNORMAL LOW (ref 13.0–17.0)
Lymphocytes Relative: 23 %
Lymphs Abs: 1.4 K/uL (ref 0.7–4.0)
MCH: 29.8 pg (ref 26.0–34.0)
MCHC: 30.9 g/dL (ref 30.0–36.0)
MCV: 96.5 fL (ref 80.0–100.0)
Monocytes Absolute: 0.5 K/uL (ref 0.1–1.0)
Monocytes Relative: 8 %
Neutro Abs: 4.2 K/uL (ref 1.7–7.7)
Neutrophils Relative %: 69 %
Platelets: 308 K/uL (ref 150–400)
RBC: 2.55 MIL/uL — ABNORMAL LOW (ref 4.22–5.81)
RDW: 16.6 % — ABNORMAL HIGH (ref 11.5–15.5)
WBC: 6.1 K/uL (ref 4.0–10.5)
nRBC: 0.3 % — ABNORMAL HIGH (ref 0.0–0.2)

## 2024-06-03 LAB — HEPARIN LEVEL (UNFRACTIONATED): Heparin Unfractionated: 0.65 [IU]/mL (ref 0.30–0.70)

## 2024-06-03 LAB — PROCALCITONIN: Procalcitonin: 0.15 ng/mL

## 2024-06-03 MED ORDER — TRAVASOL 10 % IV SOLN
INTRAVENOUS | Status: AC
  Filled 2024-06-03: qty 1060.8

## 2024-06-03 MED ORDER — PROSOURCE PLUS PO LIQD
30.0000 mL | Freq: Two times a day (BID) | ORAL | Status: DC
  Administered 2024-06-03 – 2024-06-06 (×4): 30 mL via ORAL
  Filled 2024-06-03 (×12): qty 30

## 2024-06-03 MED ORDER — MAGIC MOUTHWASH
5.0000 mL | Freq: Four times a day (QID) | ORAL | Status: DC
  Administered 2024-06-03 – 2024-06-05 (×8): 5 mL via ORAL
  Filled 2024-06-03 (×35): qty 5

## 2024-06-03 NOTE — Progress Notes (Addendum)
 Nutrition Follow-up  DOCUMENTATION CODES:   Not applicable - while could not complete NFPE, upon visual observation, he is noted with apparent severe depletions, which are documented below but not used as criteria to diagnose malnutrition. Will re-assess at next bedside visit. It is likely he has some degree of malnutrition present based off weight history and notable depletions.  INTERVENTION:  TPN to meet 100% of pt estimated needs  Management per pharmacy MVI w/ trace elements added IV Thiamine  100mg  x5 days - now complete  Initiate calorie count to assess ability to wean TPN  Monitor diet advancement and tolerance  Continue Ensure Plus High Protein po BID, each supplement provides 350 kcal and 20 grams of protein   Add Magic cup TID with meals, each supplement provides 290 kcal and 9 grams of protein  Add 30 ml ProSource Plus BID with breakfast and at bedtime, each supplement provides 100 kcals and 15 grams protein.    Transition to oral MVI once off TPN  NUTRITION DIAGNOSIS:  Inadequate oral intake related to inability to eat as evidenced by NPO status. - remains applicable  GOAL:  Patient will meet greater than or equal to 90% of their needs  MONITOR:  Labs, I & O's, Weight trends, Diet advancement  REASON FOR ASSESSMENT:  Consult New TPN/TNA  ASSESSMENT:  63 y.o. male presented to the ED with abdominal pain, nausea and vomiting. Pt recently admitted in July 2025 and underwent abdominal surgery with ileostomy creation. PMH includes GERD, HTN, gout, and diverticulitis. Pt admitted with sepsis due to intraabdominal infection vs possible perforation.  8/28 - Admitted; strict NPO per surgery recommendation 8/30 - TPN initiated (70% of estimated needs) 8/31 - TPN up to goal 9/03 - calorie count initiated    Tolerating soft diet. Calorie count ordered today, per surgery. Discussed initiation of calorie count with patient today at bedside. Encouraged adequate intake to  promote discontinuation of TPN. He is in agreement that he would like it to be turned off, however endorses that he is not experiencing any unpleasant symptoms related to administration.   Educated patient regarding importance of adequate calorie and protein intake to promote wound healing and progress with therapy. He reports wanting to put some weight back on. Will add Magic Cup and Prosource to augment intake and boost protein and calorie intake.   Admit Weight: 68 kg Current Weight: 63.6 kg  No edema documented or reported. Unable to complete NFPE to verify. As per last assessment, patient has shown significant weight loss over last year. Suspect that pt meets criteria for malnutrition, but unable to diagnosis at this time due to unable to complete physical exam.    Nutrition Related Medications: Protonix , IV antibiotics   Sodium has stabilized, as has magnesium  and WBCs. Crp down trending, but still elevated. Hemoglobin trending down as well.   Labs: Sodium [phone] (wdl) Potassium 3.2--->4.3 (wdl)  Magnesium  1.9 (wdl) WBC 11.9--->6.1 (wdl) CRP 10.7>7.4>4.2 (H) Hgb 8.1>7.8>7.6 (L) CBGs 119-128 x24 hours A1c 5.9 (11/2023)  NUTRITION - FOCUSED PHYSICAL EXAM: Pt not amicable to NFPE at time of bedside visit, will defer to next follow up. Did visually assess depletions during interview and documented below, however will not use as criteria for malnutrition as could not physically palpate.   Flowsheet Row Most Recent Value  Orbital Region Moderate depletion  Upper Arm Region Unable to assess  Thoracic and Lumbar Region Unable to assess  Buccal Region Moderate depletion  Temple Region Moderate depletion  Clavicle Bone  Region Unable to assess  Clavicle and Acromion Bone Region Unable to assess  Scapular Bone Region Unable to assess  Dorsal Hand Moderate depletion  Patellar Region Unable to assess  Anterior Thigh Region Unable to assess  Posterior Calf Region Unable to assess   Edema (RD Assessment) Unable to assess  Hair Unable to assess  Eyes Reviewed  Mouth Reviewed  Skin Unable to assess  Nails Unable to assess     Diet Order:   Diet Order             DIET SOFT Room service appropriate? Yes; Fluid consistency: Thin  Diet effective now             EDUCATION NEEDS:  No education needs have been identified at this time  Skin:  Skin Assessment: Reviewed RN Assessment  Per WOC note on 9/02: Stoma type/location:  RLQ ileostomy, flush pink and moist  Stomal assessment/size: 1 pink and moist  liquid output in pouch, emptied.  Peristomal assessment:  Skin is intact, creasing at 3:00 and minimally at 9:00.  Last BM:  9/3 - type 7 x2  Height:  Ht Readings from Last 1 Encounters:  05/27/24 5' 9 (1.753 m)   Weight:  Wt Readings from Last 1 Encounters:  06/03/24 63.6 kg   Ideal Body Weight:  72.7 kg  BMI:  Body mass index is 20.71 kg/m.  Estimated Nutritional Needs:   Kcal:  2100-2300  Protein:  105-125 grams  Fluid:  >/= 2 L  Blair Deaner MS, RD, LDN Registered Dietitian Clinical Nutrition RD Inpatient Contact Info in Amion

## 2024-06-03 NOTE — Progress Notes (Signed)
 PHARMACY - ANTICOAGULATION CONSULT NOTE  Pharmacy Consult:  Heparin  Indication: VTE treatment (warfarin PTA on hold)  No Known Allergies  Patient Measurements: Height: 5' 9 (175.3 cm) Weight: 63.6 kg (140 lb 3.4 oz) IBW/kg (Calculated) : 70.7 HEPARIN  DW (KG): 68  Vital Signs: Temp: 98.3 F (36.8 C) (09/03 0819) Temp Source: Oral (09/03 0819) BP: 113/61 (09/03 0819) Pulse Rate: 82 (09/03 0819)  Labs: Recent Labs    06/01/24 0252 06/02/24 0332 06/03/24 0331  HGB 8.1* 7.8* 7.6*  HCT 25.6* 25.3* 24.6*  PLT 293 292 308  LABPROT 13.7  --   --   INR 1.0  --   --   HEPARINUNFRC 0.50 0.58 0.65  CREATININE 0.53* 0.62 0.70    Estimated Creatinine Clearance: 86.1 mL/min (by C-G formula based on SCr of 0.7 mg/dL).  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 Pharmacy consulted for heparin  gtt for bridge while patient undergoes evaluation for possible procedure.   Heparin  level remains therapeutic on infusion at 2200 units/hr.   Goal of Therapy:  Heparin  level 0.3-0.7 units/ml Monitor platelets by anticoagulation protocol: Yes   Plan:  Continue heparin  infusion at 2200 units/hr Daily heparin  level and CBC Monitor for signs and symptoms of bleeding   Thank you. Olam Monte, PharmD   06/03/2024 9:28 AM

## 2024-06-03 NOTE — Plan of Care (Signed)

## 2024-06-03 NOTE — Progress Notes (Signed)
 Physical Therapy Treatment Patient Details Name: Elizjah Noblet MRN: [id number] DOB: 08-13-1961 Today's Date: 06/03/2024   History of Present Illness The pt is a 63 yo male presenting 8/27 from [facility] with n/v and lethargy. Work up revealed pneumoperitoneum and sepsis, likely due to intraabdominal infection. Pt with extensive hx of abdominal surgery including  emergent laparotomy and right hemicolectomy (4/25), returned to the OR twice for additional SBR and eventual end ileostomy with abdominal closure, returned to OR for ex-lap with sigmoid colectomy, ileocolonic anastomosis, and creation of a loop ileostomy after wound dehiscence (7/25). Additional PMH includes: PE, DVT, HTN, and pulmonary HTN.    PT Comments  Pt tolerated treatment well today. Pt today was able to progress ambulation in hallway with RW supervision/CGA. No change in DC/DME recs at this time. PT will continue to follow.     If plan is discharge home, recommend the following: Two people to help with walking and/or transfers;Two people to help with bathing/dressing/bathroom;Direct supervision/assist for medications management;Direct supervision/assist for financial management;Help with stairs or ramp for entrance   Can travel by private vehicle     No  Equipment Recommendations  Wheelchair (measurements PT);Wheelchair cushion (measurements PT)    Recommendations for Other Services       Precautions / Restrictions Precautions Precautions: Fall;Other (comment) Recall of Precautions/Restrictions: Impaired Precaution/Restrictions Comments: abdominal, ileostomy Restrictions Weight Bearing Restrictions Per Provider Order: No     Mobility  Bed Mobility Overal bed mobility: Needs Assistance Bed Mobility: Supine to Sit     Supine to sit: Contact guard     General bed mobility comments: no physical assistance provided.    Transfers Overall transfer level: Needs assistance Equipment used: Rolling walker (2  wheels) Transfers: Sit to/from Stand Sit to Stand: Supervision, Contact guard assist           General transfer comment: Pt able to stand on his own and use urinal    Ambulation/Gait Ambulation/Gait assistance: Supervision, Contact guard assist, +2 safety/equipment (+2 for lines and leads) Gait Distance (Feet): 100 Feet Assistive device: Rolling walker (2 wheels) Gait Pattern/deviations: Trunk flexed, Decreased stride length, Step-through pattern Gait velocity: reduced     General Gait Details: +2 for lines and leads. no physical assistance provided. Cues for proximity to RW.   Stairs             Wheelchair Mobility     Tilt Bed    Modified Rankin (Stroke Patients Only)       Balance Overall balance assessment: Mild deficits observed, not formally tested                                          Communication Communication Communication: No apparent difficulties Factors Affecting Communication: Reduced clarity of speech  Cognition Arousal: Alert Behavior During Therapy: Agitated, WFL for tasks assessed/performed                           PT - Cognition Comments: Pt mostly calm today however did have some periods of agitation with RN. Following commands: Intact (when agreeable)      Cueing Cueing Techniques: Verbal cues  Exercises      General Comments General comments (skin integrity, edema, etc.): VSS on RA      Pertinent Vitals/Pain Pain Assessment Pain Assessment: No/denies pain    Home Living  Prior Function            PT Goals (current goals can now be found in the care plan section) Progress towards PT goals: Progressing toward goals    Frequency    Min 1X/week      PT Plan      Co-evaluation              AM-PAC PT 6 Clicks Mobility   Outcome Measure  Help needed turning from your back to your side while in a flat bed without using bedrails?: A  Little Help needed moving from lying on your back to sitting on the side of a flat bed without using bedrails?: A Little Help needed moving to and from a bed to a chair (including a wheelchair)?: A Little Help needed standing up from a chair using your arms (e.g., wheelchair or bedside chair)?: A Little Help needed to walk in hospital room?: A Little Help needed climbing 3-5 steps with a railing? : A Lot 6 Click Score: 17    End of Session Equipment Utilized During Treatment: Gait belt Activity Tolerance: Patient tolerated treatment well Patient left: in chair;with call bell/phone within reach Nurse Communication: Mobility status PT Visit Diagnosis: Unsteadiness on feet (R26.81);Muscle weakness (generalized) (M62.81)     Time: 8882-8863 PT Time Calculation (min) (ACUTE ONLY): 19 min  Charges:    $Gait Training: 8-22 mins PT General Charges $$ ACUTE PT VISIT: 1 Visit                     Sueellen NOVAK, PT, DPT Acute Rehab Services [phone]    Leshawn Houseworth 06/03/2024, 12:02 PM

## 2024-06-03 NOTE — Progress Notes (Signed)
 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 Usual Weight: 68.2 Kg  Assessment:  63 yo M with complex surgical history including 12/2023 developed colonic perforation I/s/o diverticulitis requiring emergent ex lap initially left in discontinuity and required additional subsequent SBR and ileostomy, complicated postoperative course including cardiac arrest, shock, renal failure requiring CRRT, intrabdominal abscess requiring drainage including VRE intraabdominal infection. Most recent surgery 04/13/24 with ileocolic anastomosis and diverting loop ileostomy for fascial dehiscence. Admitted 8/27 with NVD and concern for perforated small bowel. Pharmacy consulted for TPN.  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 Tanda - return 9/27 for further eval of abd wound dehiscence  Central access: PICC placed 05/29/2024 TPN start date: 05/29/2024  Nutritional Goals: Goal TPN rate is 85 mL/hr (provides 107g AA and 2117 kcals per day)  RD Estimated Needs Total Energy Estimated Needs: 2100-2300 Total Protein Estimated Needs: 105-125 grams Total Fluid Estimated Needs: >/= 2 L  Current Nutrition:  TPN Advance to soft diet Ensure Plus High Protein BID - none  charted given  Plan:  Continue TPN at goal rate of 85 mL/hr to provide 100% of needs  Electrolytes in TPN: increase Na 125 mEq/L on 9/1, decrease K 45 mEq/L on 9/1, Ca 2 mEq/L, increase Mg 10 mEq/L and decrease Phos 10 mmol/L on 9/1, Cl:Ac 1:1 Add standard MVI and trace elements to TPN S/p thiamine  100mg  IV daily x 5 days  Monitor TPN labs on Mon/Thurs - next 9/4 F/u PO intake and caloria count, wean TPN as able   Thank you for involving pharmacy in this patient's care.  Delon Sax, PharmD, BCPS Clinical Pharmacist Clinical phone for 06/03/2024 is [phone] 06/03/2024 7:07 AM

## 2024-06-03 NOTE — Progress Notes (Signed)
 PROGRESS NOTE                                                                                                                                                                                                             Patient Demographics:    Alexander [name], is a 63 y.o. male, DOB - Mar 24, 1961, FMW:[id number]  Outpatient Primary MD for the patient is [name], [name]    LOS - 6  Admit date - 05/27/2024    Chief Complaint  Patient presents with   Abdominal Pain   Nausea   Weakness   Emesis       Brief Narrative (HPI from H&P)   63 y.o. male with complex hx in 4/'25 developed colonic perforation I/s/o diverticulitis requiring emergent ex lap initially left in discontinuity and required additional subsequent SBR and ileostomy, complicated postoperative course including cardiac arrest, shock, renal failure requiring CRRT, intrabdominal abscess requiring drainage including VRE intraabdominal infection (S to Daptomycin ), ultimately discharged to [facility], and readmitted in 7/'25 with wound dehiscence and underwent ex lap 7/14 with extensive LOA, sigmoid colectomy and ileocolonic anastamosis and creation of a loop ileostomy, and 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 Presented with acute onset of N/V, abd pain, imaging was suggestive of some free air in the abdomen which probably contained perforation, seen by general surgery and admitted to the hospital.   Subjective:   Patient in bed, no dyspnea, no chest pain, abdominal pain at baseline, he report some throat pain.    Assessment  & Plan :   Sepsis, nausea vomiting, secondary to possible contained intra-abdominal perforation.  Patient with history of multiple abdominal surgeries and recent intra-abdominal abscess, has ileostomy.      He has been seen by  general surgery, he has complicated history of multiple abdominal surgeries, recent intra-abdominal abscess, ileostomy.  There is suspicion of contained perforation, he has been treated conservatively with bowel rest, IV fluids, TNA, pain control and antibiotics.  Will defer management of this issue to general surgery who is following the patient.  - Tolerating soft diet, remains on TPN, plan to start calorie count  Note patient has chronic pain and is on high doses of narcotics at home, use with caution as blood pressure borderline  low.  Sepsis pathophysiology is improving, blood cultures negative final.    Prolonged Qtc  Avoid QT prolonging medications, replace electrolytes, monitor on telemetry.   Incidental rhinovirus URI.  Asymptomatic.  Monitor.    Hypomagnesemia and hypokalemia.  Replaced.    Chronic medical problems: Reintroduce oral medications as he is more consistent with oral diet.  Complex Surgical Hx per above, noted   DVT/PE: Hold Warfarin in case requires surgery. INR < 2 at present. Trend daily. Continue  Heparin  drip.  Diastolic HF: Without exacerbation, dry clinically.   HTN: not on antiHTN at time of admission   Anemia: Recent baseline ~ 8, hemoconcentrated on admission anticipate will drop towards 8. Holding iron , type screen and monitor.  Chronic pain: Home regimen is Dilaudid  2 mg PO q 4 hr prn, see acute pain management above. Holding home Gabapentin .   ?insomnia/mood: holding trazodone    ?? ASCVD: Hold aspirin ; unclear indication since on Warfarin per above      Condition - Extremely Guarded  Family Communication  : None present  Code Status : Full code  Consults  : General surgery  PUD Prophylaxis : PPI   Procedures  :     Small bowel follow-through 05/28/2024.  No extravasation of contrast.    CT 05/28/2024- 1. Findings which remain concerning for the presence of contained free air within the mid right abdomen and right upper quadrant, as  described above. 2. Markedly inflamed, partially opacified small bowel loops within the mid and upper right abdomen. 3. Curvilinear area of oral contrast within the lateral aspect of the mid to lower right abdomen which may be within a decompressed small bowel loop, however, intraluminal location cannot completely be confirmed. 4. Stable moderate to marked severity areas of atelectasis and/or infiltrate within the bilateral lower lobes. 5. Colonic diverticulosis. 6. Aortic atherosclerosis.      Disposition Plan  :    Status is: Inpatient  DVT Prophylaxis  :  Hep gtt  SCDs Start: 05/28/24 0530    Lab Results  Component Value Date   PLT 308 06/03/2024    Diet :  Diet Order             DIET SOFT Room service appropriate? Yes; Fluid consistency: Thin  Diet effective now                    Inpatient Medications  Scheduled Meds:  Chlorhexidine  Gluconate Cloth  6 each Topical Daily   feeding supplement  237 mL Oral BID BM   gabapentin   200 mg Oral TID   pantoprazole  (PROTONIX ) IV  40 mg Intravenous Q12H   sodium chloride  flush  10-40 mL Intracatheter Q12H   sodium chloride  flush  3 mL Intravenous Q12H   traZODone   100 mg Oral QHS   Continuous Infusions:  heparin  2,200 Units/hr (06/03/24 0404)   linezolid  (ZYVOX ) IV 600 mg (06/03/24 1033)   piperacillin -tazobactam (ZOSYN )  IV 3.375 g (06/03/24 1325)   TPN ADULT (ION) 85 mL/hr at 06/03/24 0221   TPN ADULT (ION)     PRN Meds:.acetaminophen , albuterol , morphine  injection **OR** morphine  injection, sodium chloride  flush, trimethobenzamide     Objective:   Vitals:   06/02/24 2000 06/03/24 0000 06/03/24 0408 06/03/24 0819  BP: 114/68 116/70 112/64 113/61  Pulse: 76 78 80 82  Resp: 18 12 16    Temp: 98.2 F (36.8 C) 98.3 F (36.8 C) 98.6 F (37 C) 98.3 F (36.8 C)  TempSrc: Oral Oral Oral Oral  SpO2:  97% 96% 95% 95%  Weight:   63.6 kg   Height:        Wt Readings from Last 3 Encounters:  06/03/24 63.6 kg   04/29/24 55 kg  04/06/24 71 kg     Intake/Output Summary (Last 24 hours) at 06/03/2024 1354 Last data filed at 06/03/2024 1033 Gross per 24 hour  Intake 7171.9 ml  Output 1775 ml  Net 5396.9 ml     Physical Exam  Awake Alert, Oriented X 3, No new F.N deficits, Normal affect Symmetrical Chest wall movement, Good air movement bilaterally, CTAB RRR,No Gallops,Rubs or new Murmurs, No Parasternal Heave +ve B.Sounds, Abd Soft, ileostomy present, midline surgical wound bandaged . No Cyanosis, Clubbing or edema, No new Rash or bruise           Data Review:    Recent Labs  Lab 05/30/24 0055 05/31/24 0156 06/01/24 0252 06/02/24 0332 06/03/24 0331  WBC 7.6 5.5 5.6 6.1 6.1  HGB 7.7* 7.6* 8.1* 7.8* 7.6*  HCT 24.4* 24.1* 25.6* 25.3* 24.6*  PLT 250 268 293 292 308  MCV 95.3 94.5 95.2 96.6 96.5  MCH 30.1 29.8 30.1 29.8 29.8  MCHC 31.6 31.5 31.6 30.8 30.9  RDW 16.1* 15.9* 16.0* 16.4* 16.6*  LYMPHSABS 0.6* 0.9 1.2 0.8 1.4  MONOABS 0.3 0.5 0.6 0.4 0.5  EOSABS 0.1 0.1 0.1 0.1 0.0  BASOSABS 0.0 0.0 0.0 0.1 0.0    Recent Labs  Lab 05/27/24 2000 05/27/24 2003 05/27/24 2159 05/28/24 0612 05/28/24 1801 05/29/24 0422 05/30/24 0055 05/31/24 0156 06/01/24 0252 06/02/24 0332 06/03/24 0331  NA  --   --   --  136  --  135 133* 134* 134* 138 139  K  --   --   --  3.6  --  3.3* 3.2* 3.7 4.4 4.1 4.3  CL  --   --   --  103  --  102 101 102 101 102 104  CO2  --   --   --  24  --  22 24 24 28 26 25   ANIONGAP  --   --   --  9  --  11 8 8 5 10 10   GLUCOSE  --   --   --  92  --  88 174* 121* 116* 128* 119*  BUN  --   --   --  9  --  8 8 7* <5* 8 8  CREATININE  --   --   --  0.83  --  0.73 0.65 0.54* 0.53* 0.62 0.70  AST  --   --   --   --   --  13* 19 12* 12* 12* 14*  ALT  --   --   --   --   --  11 12 9 10 9 11   ALKPHOS  --   --   --   --   --  69 54 48 49 44 48  BILITOT  --   --   --   --   --  0.8 0.3 0.2 0.2 0.3 0.3  ALBUMIN   --   --   --   --   --  2.1* 2.0* 1.9* 2.1* 2.3* 2.3*   CRP  --   --   --   --   --   --  20.6* 10.7* 7.4* 4.2*  --   PROCALCITON   < >  --   --   --   --  3.23 2.05 1.08 0.53 0.29 0.15  LATICACIDVEN  --  1.3 1.6 1.4  --   --   --   --   --   --   --   INR  --   --   --  2.2* 1.6*  --  1.2 1.1 1.0  --   --   MG   < >  --   --  1.2*  --  1.6* 1.8 1.7 1.8 1.9  --   PHOS   < >  --   --  3.3  --  3.7 2.9 3.6 5.2* 4.5  --   CALCIUM   --   --   --  9.1  --  8.8* 8.3* 8.4* 8.7* 8.7* 8.9   < > = values in this interval not displayed.      Recent Labs  Lab 05/27/24 2000 05/27/24 2003 05/27/24 2159 05/28/24 0612 05/28/24 0612 05/28/24 1801 05/29/24 0422 05/30/24 0055 05/31/24 0156 06/01/24 0252 06/02/24 0332 06/03/24 0331  CRP  --   --   --   --   --   --   --  20.6* 10.7* 7.4* 4.2*  --   PROCALCITON  --   --   --   --    < >  --  3.23 2.05 1.08 0.53 0.29 0.15  LATICACIDVEN  --  1.3 1.6 1.4  --   --   --   --   --   --   --   --   INR  --   --   --  2.2*  --  1.6*  --  1.2 1.1 1.0  --   --   MG   < >  --   --  1.2*  --   --  1.6* 1.8 1.7 1.8 1.9  --   CALCIUM   --   --   --  9.1  --   --  8.8* 8.3* 8.4* 8.7* 8.7* 8.9   < > = values in this interval not displayed.    --------------------------------------------------------------------------------------------------------------- Lab Results  Component Value Date   CHOL 114 12/03/2023   HDL 43.40 12/03/2023   LDLCALC 58 12/03/2023   TRIG 45 06/01/2024   CHOLHDL 3 12/03/2023    Lab Results  Component Value Date   HGBA1C 5.9 12/03/2023   No results for input(s): TSH, T4TOTAL, FREET4, T3FREE, THYROIDAB in the last 72 hours. No results for input(s): VITAMINB12, FOLATE, FERRITIN, TIBC, IRON , RETICCTPCT in the last 72 hours. ------------------------------------------------------------------------------------------------------------------ Cardiac Enzymes No results for input(s): CKMB, TROPONINI, MYOGLOBIN in the last 168 hours.  Invalid input(s): CK  Micro  Results Recent Results (from the past 240 hours)  Resp panel by RT-PCR (RSV, Flu A&B, Covid) Anterior Nasal Swab     Status: None   Collection Time: 05/27/24  7:49 PM   Specimen: Anterior Nasal Swab  Result Value Ref Range Status   SARS Coronavirus 2 by RT PCR NEGATIVE NEGATIVE Final   Influenza A by PCR NEGATIVE NEGATIVE Final   Influenza B by PCR NEGATIVE NEGATIVE Final    Comment: (NOTE) The Xpert Xpress SARS-CoV-2/FLU/RSV plus assay is intended as an aid in the diagnosis of influenza from Nasopharyngeal swab specimens and should not be used as a sole basis for treatment. Nasal washings and aspirates are unacceptable for Xpert Xpress SARS-CoV-2/FLU/RSV testing.  Fact Sheet for Patients: BloggerCourse.com  Fact Sheet for Healthcare Providers: SeriousBroker.it  This test is not yet approved or cleared by the  [place]  FDA and has been authorized for detection and/or diagnosis of SARS-CoV-2 by FDA under an Emergency Use Authorization (EUA). This EUA will remain in effect (meaning this test can be used) for the duration of the COVID-19 declaration under Section 564(b)(1) of the Act, 21 U.S.C. section 360bbb-3(b)(1), unless the authorization is terminated or revoked.     Resp Syncytial Virus by PCR NEGATIVE NEGATIVE Final    Comment: (NOTE) Fact Sheet for Patients: BloggerCourse.com  Fact Sheet for Healthcare Providers: SeriousBroker.it  This test is not yet approved or cleared by the [place]  FDA and has been authorized for detection and/or diagnosis of SARS-CoV-2 by FDA under an Emergency Use Authorization (EUA). This EUA will remain in effect (meaning this test can be used) for the duration of the COVID-19 declaration under Section 564(b)(1) of the Act, 21 U.S.C. section 360bbb-3(b)(1), unless the authorization is terminated or revoked.  Performed at [facility], [address] [place], [place] [address]   Blood Culture (routine x 2)     Status: None   Collection Time: 05/27/24  8:00 PM   Specimen: BLOOD LEFT ARM  Result Value Ref Range Status   Specimen Description BLOOD LEFT ARM  Final   Special Requests   Final    BOTTLES DRAWN AEROBIC AND ANAEROBIC Blood Culture adequate volume   Culture   Final    NO GROWTH 5 DAYS Performed at [facility], [address], [place], [place] [address]    Report Status 06/01/2024 FINAL  Final  Blood Culture (routine x 2)     Status: None   Collection Time: 05/27/24  8:44 PM   Specimen: BLOOD RIGHT ARM  Result Value Ref Range Status   Specimen Description BLOOD RIGHT ARM  Final   Special Requests   Final    BOTTLES DRAWN AEROBIC AND ANAEROBIC Blood Culture adequate volume   Culture   Final    NO GROWTH 5 DAYS Performed at [facility], [address], [place], [place] [address]    Report Status 06/01/2024 FINAL  Final  Respiratory (~20 pathogens) panel by PCR     Status: Abnormal   Collection Time: 05/28/24  8:58 AM   Specimen: Nasopharyngeal Swab; Respiratory  Result Value Ref Range Status   Adenovirus NOT DETECTED NOT DETECTED Final   Coronavirus 229E NOT DETECTED NOT DETECTED Final    Comment: (NOTE) The Coronavirus on the Respiratory Panel, DOES NOT test for the novel  Coronavirus (2019 nCoV)    Coronavirus HKU1 NOT DETECTED NOT DETECTED Final   Coronavirus NL63 NOT DETECTED NOT DETECTED Final   Coronavirus OC43 NOT DETECTED NOT DETECTED Final   Metapneumovirus NOT DETECTED NOT DETECTED Final   Rhinovirus / Enterovirus DETECTED (A) NOT DETECTED Final   Influenza A NOT DETECTED NOT DETECTED Final   Influenza B NOT DETECTED NOT DETECTED Final   Parainfluenza Virus 1 NOT DETECTED NOT DETECTED Final   Parainfluenza Virus 2 NOT DETECTED NOT DETECTED Final   Parainfluenza Virus 3 NOT DETECTED NOT DETECTED Final   Parainfluenza Virus 4 NOT DETECTED NOT DETECTED Final    Respiratory Syncytial Virus NOT DETECTED NOT DETECTED Final   Bordetella pertussis NOT DETECTED NOT DETECTED Final   Bordetella Parapertussis NOT DETECTED NOT DETECTED Final   Chlamydophila pneumoniae NOT DETECTED NOT DETECTED Final   Mycoplasma pneumoniae NOT DETECTED NOT DETECTED Final    Comment: Performed at [facility], [address], [place], [place] [address]  Radiology Report No results found.    Signature  -   Brayton Lye M.D on 06/03/2024 at 1:54 PM   -  To page go to [email or web address]

## 2024-06-03 NOTE — Progress Notes (Signed)
 [facility] Progress Note     Subjective: CC:  NAEO. Tolerating PO. Pain overall controlled  Objective: Vital signs in last 24 hours: Temp:  [98.2 F (36.8 C)-98.6 F (37 C)] 98.3 F (36.8 C) (09/03 0819) Pulse Rate:  [76-85] 82 (09/03 0819) Resp:  [12-18] 16 (09/03 0408) BP: (112-116)/(60-70) 113/61 (09/03 0819) SpO2:  [95 %-99 %] 95 % (09/03 0819) Weight:  [63.6 kg] 63.6 kg (09/03 0408) Last BM Date : 06/02/24  Intake/Output from previous day: 09/02 0701 - 09/03 0700 In: 7171.9 [P.O.:480; I.V.:4470.8; IV Piggyback:2221.1] Out: 2125 [Urine:1275; Stool:850] Intake/Output this shift: No intake/output data recorded.  PE: Gen:  Alert, NAD, pleasant Card:  Regular rate and rhythm Pulm:  Normal effort ORA Abd: Soft, non-tender, non-distended, midline incisions with interval formation of granulation tissue, some purulence on dressing. Ileostomy with semi-solid stool (850 documented and bag currently 1/2 full)  Skin: warm and dry, no rashes  Psych: A&Ox3   Lab Results:  Recent Labs    06/02/24 0332 06/03/24 0331  WBC 6.1 6.1  HGB 7.8* 7.6*  HCT 25.3* 24.6*  PLT 292 308   BMET Recent Labs    06/02/24 0332 06/03/24 0331  NA 138 139  K 4.1 4.3  CL 102 104  CO2 26 25  GLUCOSE 128* 119*  BUN 8 8  CREATININE 0.62 0.70  CALCIUM  8.7* 8.9   PT/INR Recent Labs    06/01/24 0252  LABPROT 13.7  INR 1.0   CMP     Component Value Date/Time   NA 139 06/03/2024 0331   NA 141 01/22/2020 1137   K 4.3 06/03/2024 0331   CL 104 06/03/2024 0331   CO2 25 06/03/2024 0331   GLUCOSE 119 (H) 06/03/2024 0331   BUN 8 06/03/2024 0331   BUN 13 01/22/2020 1137   CREATININE 0.70 06/03/2024 0331   CREATININE 0.98 06/14/2017 0935   CALCIUM  8.9 06/03/2024 0331   PROT 6.6 06/03/2024 0331   PROT 7.3 01/22/2020 1137   ALBUMIN  2.3 (L) 06/03/2024 0331   ALBUMIN  4.2 01/22/2020 1137   AST 14 (L) 06/03/2024 0331   ALT 11 06/03/2024 0331   ALKPHOS 48 06/03/2024 0331    BILITOT 0.3 06/03/2024 0331   BILITOT 0.4 01/22/2020 1137   GFRNONAA >60 06/03/2024 0331   GFRNONAA >89 09/17/2014 1253   GFRAA 111 01/22/2020 1137   GFRAA >89 09/17/2014 1253   Lipase     Component Value Date/Time   LIPASE 28 04/03/2024 0200       Studies/Results: No results found.  Anti-infectives: Anti-infectives (From admission, onward)    Start     Dose/Rate Route Frequency Ordered Stop   05/29/24 1330  linezolid  (ZYVOX ) IVPB 600 mg        600 mg 300 mL/hr over 60 Minutes Intravenous Every 12 hours 05/29/24 1230     05/28/24 2000  piperacillin -tazobactam (ZOSYN ) IVPB 3.375 g        3.375 g 12.5 mL/hr over 240 Minutes Intravenous Every 8 hours 05/28/24 0534     05/28/24 0900  metroNIDAZOLE  (FLAGYL ) IVPB 500 mg        500 mg 100 mL/hr over 60 Minutes Intravenous  Once 05/28/24 0534 05/28/24 1104   05/28/24 0630  DAPTOmycin  (CUBICIN ) IVPB 700 mg/160mL premix        10 mg/kg  68 kg 200 mL/hr over 30 Minutes Intravenous  Once 05/28/24 0627 05/28/24 0754   05/27/24 1945  cefTRIAXone  (ROCEPHIN ) 2 g in sodium chloride  0.9 %  100 mL IVPB        2 g 200 mL/hr over 30 Minutes Intravenous Once 05/27/24 1934 05/27/24 2125   05/27/24 1945  metroNIDAZOLE  (FLAGYL ) IVPB 500 mg        500 mg 100 mL/hr over 60 Minutes Intravenous  Once 05/27/24 1934 05/27/24 2236        Assessment/Plan  88M with complex medical and surgical history, most recent surgery 04/13/24 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 Tanda Regal bowel inflammation with concern for possible perforation - known hostile abdomen - nausea and vomiting, small bowel enteritis and small volume pneumoperitoneum in the RUQ on CT 8/27 - CT 8/28 with concern for inflamed small bowel loop on the right with concern for contained perforation but also possible contrast leak vs contrast within decompressed loop of small  bowel  - SBFT 8/28 without contrast extravasation  - CT 8/31  - same to slightly improved.  - tolerating Soft diet, start calorie count, continue TPN - tentative plan to repeat CT scan on Friday 9/5 to follow up on intra-abdominal collections.  - continue abx - pt remains hemodynamically stable, WBC WNL  - hgb 7.8 from 8.7, continue to monitor      FEN: soft diet, PICC and TPN, IVF per [facility] VTE: heparin  gtt; continue to hold warfarin ID: Zosyn  8/28>>   - per [facility] -  Hx of PE/DVT Pulmonary HTN PAD HTN GERD - PPI Gout   LOS: 6 days   I reviewed nursing notes, hospitalist notes, last 24 h vitals and pain scores, last 48 h intake and output, last 24 h labs and trends, and last 24 h imaging results.  This care required moderate level of medical decision making.   Almarie Pringle, PA-C [facility] Please see Amion for pager number during day hours 7:00am-4:30pm

## 2024-06-04 DIAGNOSIS — K668 Other specified disorders of peritoneum: Secondary | ICD-10-CM | POA: Diagnosis not present

## 2024-06-04 LAB — COMPREHENSIVE METABOLIC PANEL WITH GFR
ALT: 13 U/L (ref 0–44)
AST: 18 U/L (ref 15–41)
Albumin: 2.4 g/dL — ABNORMAL LOW (ref 3.5–5.0)
Alkaline Phosphatase: 47 U/L (ref 38–126)
Anion gap: 10 (ref 5–15)
BUN: 10 mg/dL (ref 8–23)
CO2: 23 mmol/L (ref 22–32)
Calcium: 9 mg/dL (ref 8.9–10.3)
Chloride: 105 mmol/L (ref 98–111)
Creatinine, Ser: 0.8 mg/dL (ref 0.61–1.24)
GFR, Estimated: >60 mL/min (ref >60)
Glucose, Bld: 131 mg/dL — ABNORMAL HIGH (ref 70–99)
Potassium: 4.5 mmol/L (ref 3.5–5.1)
Sodium: 138 mmol/L (ref 135–145)
Total Bilirubin: 0.3 mg/dL (ref 0.0–1.2)
Total Protein: 6.6 g/dL (ref 6.5–8.1)

## 2024-06-04 LAB — CBC
HCT: 24.1 % — ABNORMAL LOW (ref 39.0–52.0)
Hemoglobin: 7.5 g/dL — ABNORMAL LOW (ref 13.0–17.0)
MCH: 30.5 pg (ref 26.0–34.0)
MCHC: 31.1 g/dL (ref 30.0–36.0)
MCV: 98 fL (ref 80.0–100.0)
Platelets: 371 K/uL (ref 150–400)
RBC: 2.46 MIL/uL — ABNORMAL LOW (ref 4.22–5.81)
RDW: 17.2 % — ABNORMAL HIGH (ref 11.5–15.5)
WBC: 6.1 K/uL (ref 4.0–10.5)
nRBC: 0.3 % — ABNORMAL HIGH (ref 0.0–0.2)

## 2024-06-04 LAB — PHOSPHORUS: Phosphorus: 3.4 mg/dL (ref 2.5–4.6)

## 2024-06-04 LAB — MAGNESIUM: Magnesium: 1.8 mg/dL (ref 1.7–2.4)

## 2024-06-04 LAB — HEPARIN LEVEL (UNFRACTIONATED): Heparin Unfractionated: 0.48 [IU]/mL (ref 0.30–0.70)

## 2024-06-04 MED ORDER — TRAVASOL 10 % IV SOLN
INTRAVENOUS | Status: AC
  Filled 2024-06-04: qty 1060.8

## 2024-06-04 MED ORDER — MAGNESIUM SULFATE IN D5W 1-5 GM/100ML-% IV SOLN
1.0000 g | Freq: Once | INTRAVENOUS | Status: AC
  Administered 2024-06-04: 1 g via INTRAVENOUS
  Filled 2024-06-04: qty 100

## 2024-06-04 NOTE — Progress Notes (Signed)
 PHARMACY - ANTICOAGULATION CONSULT NOTE  Pharmacy Consult:  Heparin  Indication: VTE treatment (warfarin PTA on hold)  No Known Allergies  Patient Measurements: Height: 5' 9 (175.3 cm) Weight: 64 kg (141 lb 1.5 oz) IBW/kg (Calculated) : 70.7 HEPARIN  DW (KG): 68  Vital Signs: Temp: 98.5 F (36.9 C) (09/04 0400) Temp Source: Oral (09/04 0400) BP: 116/68 (09/04 0400) Pulse Rate: 76 (09/04 0400)  Labs: Recent Labs    06/02/24 0332 06/03/24 0331 06/04/24 0038  HGB 7.8* 7.6*  --   HCT 25.3* 24.6*  --   PLT 292 308  --   HEPARINUNFRC 0.58 0.65 0.48  CREATININE 0.62 0.70 0.80    Estimated Creatinine Clearance: 86.7 mL/min (by C-G formula based on SCr of 0.8 mg/dL).  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 Pharmacy consulted for heparin  gtt for bridge while patient undergoes evaluation for possible procedure.   Heparin  level remains therapeutic on infusion at 2200 units/hr.   9/4 AM update: Heparin  level remains therapeutic   Goal of Therapy:  Heparin  level 0.3-0.7 units/ml Monitor platelets by anticoagulation protocol: Yes   Plan:  Continue heparin  infusion at 2200 units/hr Daily heparin  level and CBC Monitor for signs and symptoms of bleeding   Lynwood Mckusick, PharmD, BCPS Clinical Pharmacist Phone: [phone]

## 2024-06-04 NOTE — Plan of Care (Signed)

## 2024-06-04 NOTE — Progress Notes (Addendum)
 PROGRESS NOTE                                                                                                                                                                                                             Patient Demographics:    Alexander [name], is a 63 y.o. male, DOB - 25-Dec-1960, FMW:[id number]  Outpatient Primary MD for the patient is [name], [name]    LOS - 7  Admit date - 05/27/2024    Chief Complaint  Patient presents with   Abdominal Pain   Nausea   Weakness   Emesis       Brief Narrative (HPI from H&P)    63 y.o. male with complex hx in 4/'25 developed colonic perforation I/s/o diverticulitis requiring emergent ex lap initially left in discontinuity and required additional subsequent SBR and ileostomy, complicated postoperative course including cardiac arrest, shock, renal failure requiring CRRT, intrabdominal abscess requiring drainage including VRE intraabdominal infection (S to Daptomycin ), ultimately discharged to [facility], and readmitted in 7/'25 with wound dehiscence and underwent ex lap 7/14 with extensive LOA, sigmoid colectomy and ileocolonic anastamosis and creation of a loop ileostomy, and 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 Presented with acute onset of N/V, abd pain, imaging was suggestive of some free air in the abdomen which probably contained perforation, seen by general surgery and admitted to the hospital.   Subjective:   No chest pain, no nausea, no vomiting, no significant events overnight.   Assessment  & Plan :   Sepsis, nausea vomiting, secondary to possible contained intra-abdominal perforation.  Patient with history of multiple abdominal surgeries and recent intra-abdominal abscess, has ileostomy.     -He has been seen by general surgery, he has  complicated history of multiple abdominal surgeries, recent intra-abdominal abscess, ileostomy.  There is suspicion of contained perforation, he has been treated conservatively with bowel rest, IV fluids, TNA, pain control and antibiotics.  Will defer management of this issue to general surgery who is following the patient.  Plan to repeat CT abdomen pelvis tomorrow - Tolerating soft diet, remains on TPN, currently on calorie count . - Note patient has chronic pain and is on high doses of narcotics at home, use with caution as blood pressure borderline  low.  Sepsis pathophysiology is improving, blood cultures negative final.    Prolonged Qtc  Avoid QT prolonging medications, replace electrolytes, monitor on telemetry.   Incidental rhinovirus URI.  Asymptomatic.  Monitor.    Hypomagnesemia and hypokalemia.  Replaced.    Chronic medical problems: Reintroduce oral medications as he is more consistent with oral diet.  Complex Surgical Hx per above, noted   DVT/PE: Hold Warfarin in case requires surgery. INR < 2 at present. Trend daily. Continue  Heparin  drip.  Monitor CBC closely.  Diastolic HF: Without exacerbation, dry clinically.   HTN: not on antiHTN at time of admission   Anemia: - Baseline around 8, some drop on admission secondary to hydration.  Chronic pain: Home regimen is Dilaudid  2 mg PO q 4 hr prn, see acute pain management above. Holding home Gabapentin .   ?insomnia/mood: holding trazodone    ?? ASCVD: Hold aspirin ; unclear indication since on Warfarin per above      Condition - Extremely Guarded  Family Communication  : None present  Code Status : Full code  Consults  : General surgery  PUD Prophylaxis : PPI   Procedures  :     Small bowel follow-through 05/28/2024.  No extravasation of contrast.    CT 05/28/2024- 1. Findings which remain concerning for the presence of contained free air within the mid right abdomen and right upper quadrant, as described above. 2.  Markedly inflamed, partially opacified small bowel loops within the mid and upper right abdomen. 3. Curvilinear area of oral contrast within the lateral aspect of the mid to lower right abdomen which may be within a decompressed small bowel loop, however, intraluminal location cannot completely be confirmed. 4. Stable moderate to marked severity areas of atelectasis and/or infiltrate within the bilateral lower lobes. 5. Colonic diverticulosis. 6. Aortic atherosclerosis.      Disposition Plan  :    Status is: Inpatient  DVT Prophylaxis  :  Hep gtt  SCDs Start: 05/28/24 0530    Lab Results  Component Value Date   PLT 371 06/04/2024    Diet :  Diet Order             DIET SOFT Room service appropriate? Yes; Fluid consistency: Thin  Diet effective now                    Inpatient Medications  Scheduled Meds:  (feeding supplement) PROSource Plus  30 mL Oral BID AC & HS   Chlorhexidine  Gluconate Cloth  6 each Topical Daily   feeding supplement  237 mL Oral BID BM   gabapentin   200 mg Oral TID   magic mouthwash  5 mL Oral QID   pantoprazole  (PROTONIX ) IV  40 mg Intravenous Q12H   sodium chloride  flush  10-40 mL Intracatheter Q12H   sodium chloride  flush  3 mL Intravenous Q12H   traZODone   100 mg Oral QHS   Continuous Infusions:  heparin  2,200 Units/hr (06/04/24 9391)   linezolid  (ZYVOX ) IV 600 mg (06/04/24 0958)   piperacillin -tazobactam (ZOSYN )  IV 3.375 g (06/04/24 1146)   TPN ADULT (ION) 85 mL/hr at 06/04/24 0151   TPN ADULT (ION)     PRN Meds:.acetaminophen , albuterol , morphine  injection **OR** morphine  injection, sodium chloride  flush, trimethobenzamide     Objective:   Vitals:   06/04/24 0400 06/04/24 0436 06/04/24 0755 06/04/24 1319  BP: 116/68  (!) 107/51 115/60  Pulse: 76     Resp: 11     Temp: 98.5 F (36.9  C)  97.9 F (36.6 C) 98 F (36.7 C)  TempSrc: Oral  Oral Oral  SpO2: 95%     Weight:  64 kg    Height:        Wt Readings from Last 3  Encounters:  06/04/24 64 kg  04/29/24 55 kg  04/06/24 71 kg     Intake/Output Summary (Last 24 hours) at 06/04/2024 1456 Last data filed at 06/04/2024 1147 Gross per 24 hour  Intake 1464.87 ml  Output 2650 ml  Net -1185.13 ml     Physical Exam  Awake Alert, Oriented X 3, No new F.N deficits, Normal affect Symmetrical Chest wall movement, Good air movement bilaterally, CTAB RRR,No Gallops,Rubs or new Murmurs, No Parasternal Heave +ve B.Sounds, Abd Soft, ileostomy present, abdominal wound covered. No Cyanosis, Clubbing or edema, No new Rash or bruise           Data Review:    Recent Labs  Lab 05/30/24 0055 05/31/24 0156 06/01/24 0252 06/02/24 0332 06/03/24 0331 06/04/24 0927  WBC 7.6 5.5 5.6 6.1 6.1 6.1  HGB 7.7* 7.6* 8.1* 7.8* 7.6* 7.5*  HCT 24.4* 24.1* 25.6* 25.3* 24.6* 24.1*  PLT 250 268 293 292 308 371  MCV 95.3 94.5 95.2 96.6 96.5 98.0  MCH 30.1 29.8 30.1 29.8 29.8 30.5  MCHC 31.6 31.5 31.6 30.8 30.9 31.1  RDW 16.1* 15.9* 16.0* 16.4* 16.6* 17.2*  LYMPHSABS 0.6* 0.9 1.2 0.8 1.4  --   MONOABS 0.3 0.5 0.6 0.4 0.5  --   EOSABS 0.1 0.1 0.1 0.1 0.0  --   BASOSABS 0.0 0.0 0.0 0.1 0.0  --     Recent Labs  Lab 05/28/24 1801 05/29/24 0422 05/30/24 0055 05/31/24 0156 06/01/24 0252 06/02/24 0332 06/03/24 0331 06/04/24 0038  NA  --    < > 133* 134* 134* 138 139 138  K  --    < > 3.2* 3.7 4.4 4.1 4.3 4.5  CL  --    < > 101 102 101 102 104 105  CO2  --    < > 24 24 28 26 25 23   ANIONGAP  --    < > 8 8 5 10 10 10   GLUCOSE  --    < > 174* 121* 116* 128* 119* 131*  BUN  --    < > 8 7* <5* 8 8 10   CREATININE  --    < > 0.65 0.54* 0.53* 0.62 0.70 0.80  AST  --    < > 19 12* 12* 12* 14* 18  ALT  --    < > 12 9 10 9 11 13   ALKPHOS  --    < > 54 48 49 44 48 47  BILITOT  --    < > 0.3 0.2 0.2 0.3 0.3 0.3  ALBUMIN   --    < > 2.0* 1.9* 2.1* 2.3* 2.3* 2.4*  CRP  --   --  20.6* 10.7* 7.4* 4.2*  --   --   PROCALCITON  --    < > 2.05 1.08 0.53 0.29 0.15  --   INR 1.6*   --  1.2 1.1 1.0  --   --   --   MG  --    < > 1.8 1.7 1.8 1.9  --  1.8  PHOS  --    < > 2.9 3.6 5.2* 4.5  --  3.4  CALCIUM   --    < > 8.3* 8.4* 8.7* 8.7* 8.9  9.0   < > = values in this interval not displayed.      Recent Labs  Lab 05/28/24 1801 05/29/24 0422 05/30/24 0055 05/31/24 0156 06/01/24 0252 06/02/24 0332 06/03/24 0331 06/04/24 0038  CRP  --   --  20.6* 10.7* 7.4* 4.2*  --   --   PROCALCITON  --    < > 2.05 1.08 0.53 0.29 0.15  --   INR 1.6*  --  1.2 1.1 1.0  --   --   --   MG  --    < > 1.8 1.7 1.8 1.9  --  1.8  CALCIUM   --    < > 8.3* 8.4* 8.7* 8.7* 8.9 9.0   < > = values in this interval not displayed.    --------------------------------------------------------------------------------------------------------------- Lab Results  Component Value Date   CHOL 114 12/03/2023   HDL 43.40 12/03/2023   LDLCALC 58 12/03/2023   TRIG 45 06/01/2024   CHOLHDL 3 12/03/2023    Lab Results  Component Value Date   HGBA1C 5.9 12/03/2023   No results for input(s): TSH, T4TOTAL, FREET4, T3FREE, THYROIDAB in the last 72 hours. No results for input(s): VITAMINB12, FOLATE, FERRITIN, TIBC, IRON , RETICCTPCT in the last 72 hours. ------------------------------------------------------------------------------------------------------------------ Cardiac Enzymes No results for input(s): CKMB, TROPONINI, MYOGLOBIN in the last 168 hours.  Invalid input(s): CK  Micro Results Recent Results (from the past 240 hours)  Resp panel by RT-PCR (RSV, Flu A&B, Covid) Anterior Nasal Swab     Status: None   Collection Time: 05/27/24  7:49 PM   Specimen: Anterior Nasal Swab  Result Value Ref Range Status   SARS Coronavirus 2 by RT PCR NEGATIVE NEGATIVE Final   Influenza A by PCR NEGATIVE NEGATIVE Final   Influenza B by PCR NEGATIVE NEGATIVE Final    Comment: (NOTE) The Xpert Xpress SARS-CoV-2/FLU/RSV plus assay is intended as an aid in the diagnosis of influenza  from Nasopharyngeal swab specimens and should not be used as a sole basis for treatment. Nasal washings and aspirates are unacceptable for Xpert Xpress SARS-CoV-2/FLU/RSV testing.  Fact Sheet for Patients: BloggerCourse.com  Fact Sheet for Healthcare Providers: SeriousBroker.it  This test is not yet approved or cleared by the [place]  FDA and has been authorized for detection and/or diagnosis of SARS-CoV-2 by FDA under an Emergency Use Authorization (EUA). This EUA will remain in effect (meaning this test can be used) for the duration of the COVID-19 declaration under Section 564(b)(1) of the Act, 21 U.S.C. section 360bbb-3(b)(1), unless the authorization is terminated or revoked.     Resp Syncytial Virus by PCR NEGATIVE NEGATIVE Final    Comment: (NOTE) Fact Sheet for Patients: BloggerCourse.com  Fact Sheet for Healthcare Providers: SeriousBroker.it  This test is not yet approved or cleared by the [place]  FDA and has been authorized for detection and/or diagnosis of SARS-CoV-2 by FDA under an Emergency Use Authorization (EUA). This EUA will remain in effect (meaning this test can be used) for the duration of the COVID-19 declaration under Section 564(b)(1) of the Act, 21 U.S.C. section 360bbb-3(b)(1), unless the authorization is terminated or revoked.  Performed at [facility], [address] [place], [place] [address]   Blood Culture (routine x 2)     Status: None   Collection Time: 05/27/24  8:00 PM   Specimen: BLOOD LEFT ARM  Result Value Ref Range Status   Specimen Description BLOOD LEFT ARM  Final   Special Requests   Final  BOTTLES DRAWN AEROBIC AND ANAEROBIC Blood Culture adequate volume   Culture   Final    NO GROWTH 5 DAYS Performed at [facility], [address], [place], [place] [address]    Report Status 06/01/2024 FINAL  Final   Blood Culture (routine x 2)     Status: None   Collection Time: 05/27/24  8:44 PM   Specimen: BLOOD RIGHT ARM  Result Value Ref Range Status   Specimen Description BLOOD RIGHT ARM  Final   Special Requests   Final    BOTTLES DRAWN AEROBIC AND ANAEROBIC Blood Culture adequate volume   Culture   Final    NO GROWTH 5 DAYS Performed at [facility], [address], [place], [place] [address]    Report Status 06/01/2024 FINAL  Final  Respiratory (~20 pathogens) panel by PCR     Status: Abnormal   Collection Time: 05/28/24  8:58 AM   Specimen: Nasopharyngeal Swab; Respiratory  Result Value Ref Range Status   Adenovirus NOT DETECTED NOT DETECTED Final   Coronavirus 229E NOT DETECTED NOT DETECTED Final    Comment: (NOTE) The Coronavirus on the Respiratory Panel, DOES NOT test for the novel  Coronavirus (2019 nCoV)    Coronavirus HKU1 NOT DETECTED NOT DETECTED Final   Coronavirus NL63 NOT DETECTED NOT DETECTED Final   Coronavirus OC43 NOT DETECTED NOT DETECTED Final   Metapneumovirus NOT DETECTED NOT DETECTED Final   Rhinovirus / Enterovirus DETECTED (A) NOT DETECTED Final   Influenza A NOT DETECTED NOT DETECTED Final   Influenza B NOT DETECTED NOT DETECTED Final   Parainfluenza Virus 1 NOT DETECTED NOT DETECTED Final   Parainfluenza Virus 2 NOT DETECTED NOT DETECTED Final   Parainfluenza Virus 3 NOT DETECTED NOT DETECTED Final   Parainfluenza Virus 4 NOT DETECTED NOT DETECTED Final   Respiratory Syncytial Virus NOT DETECTED NOT DETECTED Final   Bordetella pertussis NOT DETECTED NOT DETECTED Final   Bordetella Parapertussis NOT DETECTED NOT DETECTED Final   Chlamydophila pneumoniae NOT DETECTED NOT DETECTED Final   Mycoplasma pneumoniae NOT DETECTED NOT DETECTED Final    Comment: Performed at [facility], [address], [place], [place] [address]    Radiology Report No results found.    Signature  -   Brayton Lye M.D on 06/04/2024 at 2:56 PM   -  To page go to  [email or web address]

## 2024-06-04 NOTE — Progress Notes (Signed)
 [facility] Progress Note     Subjective: Doing well.  Pain controlled.  Eating fairly well according to him.    Objective: Vital signs in last 24 hours: Temp:  [97.9 F (36.6 C)-98.6 F (37 C)] 97.9 F (36.6 C) (09/04 0755) Pulse Rate:  [76-86] 76 (09/04 0400) Resp:  [11-18] 11 (09/04 0400) BP: (103-116)/(49-68) 107/51 (09/04 0755) SpO2:  [95 %] 95 % (09/04 0400) Weight:  [64 kg] 64 kg (09/04 0436) Last BM Date : 06/03/24  Intake/Output from previous day: 09/03 0701 - 09/04 0700 In: 1704.9 [P.O.:600; I.V.:834.7; IV Piggyback:270.2] Out: 2250 [Urine:1600; Stool:650] Intake/Output this shift: Total I/O In: -  Out: 450 [Urine:300; Stool:150]  PE: Gen:  Alert, NAD, pleasant Card:  Regular rate and rhythm Pulm:  Normal effort ORA Abd: Soft, non-tender, non-distended, midline incisions with interval formation of granulation tissue. Ileostomy with semi-solid stool (650 documented)   Psych: A&Ox3   Lab Results:  Recent Labs    06/03/24 0331 06/04/24 0927  WBC 6.1 6.1  HGB 7.6* 7.5*  HCT 24.6* 24.1*  PLT 308 371   BMET Recent Labs    06/03/24 0331 06/04/24 0038  NA 139 138  K 4.3 4.5  CL 104 105  CO2 25 23  GLUCOSE 119* 131*  BUN 8 10  CREATININE 0.70 0.80  CALCIUM  8.9 9.0   PT/INR No results for input(s): LABPROT, INR in the last 72 hours.  CMP     Component Value Date/Time   NA 138 06/04/2024 0038   NA 141 01/22/2020 1137   K 4.5 06/04/2024 0038   CL 105 06/04/2024 0038   CO2 23 06/04/2024 0038   GLUCOSE 131 (H) 06/04/2024 0038   BUN 10 06/04/2024 0038   BUN 13 01/22/2020 1137   CREATININE 0.80 06/04/2024 0038   CREATININE 0.98 06/14/2017 0935   CALCIUM  9.0 06/04/2024 0038   PROT 6.6 06/04/2024 0038   PROT 7.3 01/22/2020 1137   ALBUMIN  2.4 (L) 06/04/2024 0038   ALBUMIN  4.2 01/22/2020 1137   AST 18 06/04/2024 0038   ALT 13 06/04/2024 0038   ALKPHOS 47 06/04/2024 0038   BILITOT 0.3 06/04/2024 0038   BILITOT 0.4 01/22/2020  1137   GFRNONAA >60 06/04/2024 0038   GFRNONAA >89 09/17/2014 1253   GFRAA 111 01/22/2020 1137   GFRAA >89 09/17/2014 1253   Lipase     Component Value Date/Time   LIPASE 28 04/03/2024 0200       Studies/Results: No results found.  Anti-infectives: Anti-infectives (From admission, onward)    Start     Dose/Rate Route Frequency Ordered Stop   05/29/24 1330  linezolid  (ZYVOX ) IVPB 600 mg        600 mg 300 mL/hr over 60 Minutes Intravenous Every 12 hours 05/29/24 1230     05/28/24 2000  piperacillin -tazobactam (ZOSYN ) IVPB 3.375 g        3.375 g 12.5 mL/hr over 240 Minutes Intravenous Every 8 hours 05/28/24 0534     05/28/24 0900  metroNIDAZOLE  (FLAGYL ) IVPB 500 mg        500 mg 100 mL/hr over 60 Minutes Intravenous  Once 05/28/24 0534 05/28/24 1104   05/28/24 0630  DAPTOmycin  (CUBICIN ) IVPB 700 mg/142mL premix        10 mg/kg  68 kg 200 mL/hr over 30 Minutes Intravenous  Once 05/28/24 0627 05/28/24 0754   05/27/24 1945  cefTRIAXone  (ROCEPHIN ) 2 g in sodium chloride  0.9 % 100 mL IVPB  2 g 200 mL/hr over 30 Minutes Intravenous Once 05/27/24 1934 05/27/24 2125   05/27/24 1945  metroNIDAZOLE  (FLAGYL ) IVPB 500 mg        500 mg 100 mL/hr over 60 Minutes Intravenous  Once 05/27/24 1934 05/27/24 2236        Assessment/Plan  21M with complex medical and surgical history, most recent surgery 04/13/24 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 Tanda Regal bowel inflammation with concern for possible perforation - known hostile abdomen - nausea and vomiting, small bowel enteritis and small volume pneumoperitoneum in the RUQ on CT 8/27 - CT 8/28 with concern for inflamed small bowel loop on the right with concern for contained perforation but also possible contrast leak vs contrast within decompressed loop of small  bowel  - SBFT 8/28 without contrast extravasation  - CT 8/31 - same to slightly improved.  - tolerating Soft  diet, calorie count, continue TPN - repeat CT scan on Friday 9/5 to follow up on intra-abdominal collections.  - continue abx - pt remains hemodynamically stable, WBC WNL  - hgb 7.5, continue to monitor      FEN: soft diet, PICC and TPN, IVF per TR[facility] VTE: heparin  gtt; continue to hold warfarin ID: Zosyn  8/28>>   - per [facility] -  Hx of PE/DVT Pulmonary HTN PAD HTN GERD - PPI Gout   LOS: 7 days   Alexander [name], [name] Please see Amion for pager number during day hours 7:00am-4:30pm

## 2024-06-04 NOTE — TOC Progression Note (Signed)
 Transition of Care [facility]) - Progression Note    Patient Details  Name: Alexander [name] MRN: [id number] Date of Birth: 02-Nov-1960  [facility] CM/SW Contact  Inocente GORMAN Kindle, LCSW Phone Number: 06/04/2024, 11:09 AM  Clinical Narrative:    CSW continuing to follow.    Expected Discharge Plan: Skilled Nursing Facility Barriers to Discharge: Continued Medical Work up, English as a second language teacher               Expected Discharge Plan and Services In-house Referral: Clinical Social Work   Post Acute Care Choice: Skilled Nursing Facility Living arrangements for the past 2 months: Single Family Home, Skilled Nursing Facility                                       Social Drivers of Health (SDOH) Interventions SDOH Screenings   Food Insecurity: Patient Unable To Answer (05/28/2024)  Housing: Unknown (05/28/2024)  Recent Concern: Housing - High Risk (04/10/2024)  Transportation Needs: Patient Unable To Answer (05/28/2024)  Recent Concern: Transportation Needs - Unmet Transportation Needs (03/30/2024)  Utilities: Not At Risk (05/28/2024)  Depression (PHQ2-9): Low Risk  (07/24/2023)  Financial Resource Strain: Low Risk  (05/01/2024)   Received from [facility]  Social Connections: Moderately Isolated (05/01/2024)   Received from [facility]  Stress: No Stress Concern Present (05/13/2024)   Received from [facility]  Tobacco Use: Medium Risk (05/27/2024)    Readmission Risk Interventions    05/28/2024    2:04 PM 04/30/2024   12:54 PM 04/10/2024    1:55 PM  Readmission Risk Prevention Plan  Transportation Screening Complete Complete Complete  Medication Review Oceanographer) Complete Complete Complete  PCP or Specialist appointment within 3-5 days of discharge Complete Complete Complete  HRI or Home Care Consult Complete Complete Complete  SW Recovery Care/Counseling Consult Complete Complete Complete  Palliative Care Screening Not Applicable Not Applicable  Not Applicable  Skilled Nursing Facility Complete Not Applicable Not Applicable

## 2024-06-04 NOTE — Progress Notes (Addendum)
 Calorie Count Note  48-hour calorie count ordered. Day 1 now complete and can be reviewed below.  Estimated Nutritional Needs:  Kcal:  2100-2300 Protein:  105-125 grams Fluid:  >/= 2 L  He is not sufficiently meeting needs. Tried to elicit how much dinner he consumed last night, but he is unwilling to share specifics. He continues to state he wants food, but does not eat when encouraged to do so.  Per RN today, skipped breakfast and ate [employer]. Lunch three strips of bacon and grits. He is refusing Ensure supplements (discontinued per patient preference), but is accepting ProSource.   Recommend continuing TPN until meeting >50% of his estimated needs consistently. Could reduce to meet 75% of his estimated needs. Hopefully between an additional offering of Prosource today and consuming Magic Cup (prefers vanilla) x3, his intake will become more desirable with tomorrow's assessment.   He continues to state he does not understand what the TPN is doing for him. Says, It's not putting weight on me. It's not doing any good any way.   Educated again regarding importance of calorie and protein intake to promote wound healing and prevent loss of lean body mass. He is not engaged in conversation around this or willing to share what he would be willing to consume to augment intake. Does not seem to understand or appreciate correlation between nutrition and improvement in wound status and progression toward discharge.   Diet: Soft diet, thin liquids Supplements: Magic Cup x3, Ensure Plus High Protein x2, Prosource x1  Day 1(9/3-9/4) Breakfast: 0% consumed Lunch: 75% meatloaf, baked potato chips, and orange sherbet (435 kcals, 12g protein) Dinner: No documentation received Supplements: 100% Magic Cup x1. 100% Prosource x1 (370 kcals, 24g protein)  Day 1 Total Intake: 805 kcal (38% of minimum estimated needs)  36g protein (34% of minimum estimated needs)  INTERVENTION:  TPN to meet 100% of pt  estimated needs  Management per pharmacy MVI w/ trace elements added IV Thiamine  100mg  x5 days - now complete   Continue calorie count to assess ability to wean TPN   Monitor diet advancement and tolerance   Discontinue Ensure Plus High Protein po BID, each supplement provides 350 kcal and 20 grams of protein    Continue Magic cup TID with meals, each supplement provides 290 kcal and 9 grams of protein   Continue 30 ml ProSource Plus BID with breakfast and at bedtime, each supplement provides 100 kcals and 15 grams protein.     Transition to oral MVI once off TPN   NUTRITION DIAGNOSIS:  Inadequate oral intake related to inability to eat as evidenced by NPO status.   GOAL:  Patient will meet greater than or equal to 90% of their needs   MONITOR:  Labs, I & O's, Weight trends, Diet advancement  Blair Deaner MS, RD, LDN Registered Dietitian Clinical Nutrition RD Inpatient Contact Info in Amion

## 2024-06-04 NOTE — Progress Notes (Signed)
 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 Usual Weight: 68.2 Kg  Assessment:  63 yo M with complex surgical history including 12/2023 developed colonic perforation I/s/o diverticulitis requiring emergent ex lap initially left in discontinuity and required additional subsequent SBR and ileostomy, complicated postoperative course including cardiac arrest, shock, renal failure requiring CRRT, intrabdominal abscess requiring drainage including VRE intraabdominal infection. Most recent surgery 04/13/24 with ileocolic anastomosis and diverting loop ileostomy for fascial dehiscence. Admitted 8/27 with NVD and concern for perforated small bowel. Pharmacy consulted for TPN.  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 Tanda - return 9/27 for further eval of abd wound dehiscence  Central access: PICC placed 05/29/2024 TPN start date: 05/29/2024  Nutritional Goals: Goal TPN rate is 85 mL/hr (provides 107g AA and 2117 kcals per day)  RD Estimated Needs Total Energy Estimated Needs: 2100-2300 Total Protein Estimated Needs: 105-125 grams Total Fluid Estimated Needs: >/= 2 L  Current Nutrition:  TPN Advance to soft diet Ensure Plus High Protein BID - 2 charted given on  9/3 ProSource Plus BID - 1 charted on 9/3  Plan:  Continue TPN at goal rate of 85 mL/hr to provide 100% of needs  Electrolytes in TPN: increase Na 125 mEq/L on 9/1, decrease K 40 mEq/L, Ca 2 mEq/L, increase Mg 10 mEq/L and decrease Phos 10 mmol/L on 9/1, Cl:Ac 1:1 Add standard MVI and trace elements to TPN S/p thiamine  100mg  IV daily x 5 days  Mg 1 g IV x1 Monitor TPN labs on Mon/Thurs, in AM F/u PO intake and calorie count started 9/3, wean TPN as able   Thank you for involving pharmacy in this patient's care.  Delon Sax, PharmD, BCPS Clinical Pharmacist Clinical phone for 06/04/2024 is [phone] 06/04/2024 7:05 AM

## 2024-06-05 ENCOUNTER — Inpatient Hospital Stay (HOSPITAL_COMMUNITY)

## 2024-06-05 DIAGNOSIS — K668 Other specified disorders of peritoneum: Secondary | ICD-10-CM | POA: Diagnosis not present

## 2024-06-05 LAB — BASIC METABOLIC PANEL WITH GFR
Anion gap: 10 (ref 5–15)
BUN: 12 mg/dL (ref 8–23)
CO2: 25 mmol/L (ref 22–32)
Calcium: 9.1 mg/dL (ref 8.9–10.3)
Chloride: 103 mmol/L (ref 98–111)
Creatinine, Ser: 0.81 mg/dL (ref 0.61–1.24)
GFR, Estimated: >60 mL/min (ref >60)
Glucose, Bld: 120 mg/dL — ABNORMAL HIGH (ref 70–99)
Potassium: 4.5 mmol/L (ref 3.5–5.1)
Sodium: 138 mmol/L (ref 135–145)

## 2024-06-05 LAB — CBC
HCT: 24 % — ABNORMAL LOW (ref 39.0–52.0)
Hemoglobin: 7.2 g/dL — ABNORMAL LOW (ref 13.0–17.0)
MCH: 30.1 pg (ref 26.0–34.0)
MCHC: 30 g/dL (ref 30.0–36.0)
MCV: 100.4 fL — ABNORMAL HIGH (ref 80.0–100.0)
Platelets: 380 K/uL (ref 150–400)
RBC: 2.39 MIL/uL — ABNORMAL LOW (ref 4.22–5.81)
RDW: 17.2 % — ABNORMAL HIGH (ref 11.5–15.5)
WBC: 6.5 K/uL (ref 4.0–10.5)
nRBC: 0.8 % — ABNORMAL HIGH (ref 0.0–0.2)

## 2024-06-05 LAB — HEMOGLOBIN AND HEMATOCRIT, BLOOD
HCT: 28.4 % — ABNORMAL LOW (ref 39.0–52.0)
Hemoglobin: 8.8 g/dL — ABNORMAL LOW (ref 13.0–17.0)

## 2024-06-05 LAB — MAGNESIUM: Magnesium: 1.8 mg/dL (ref 1.7–2.4)

## 2024-06-05 LAB — HEPARIN LEVEL (UNFRACTIONATED): Heparin Unfractionated: 0.61 [IU]/mL (ref 0.30–0.70)

## 2024-06-05 LAB — PREPARE RBC (CROSSMATCH)

## 2024-06-05 MED ORDER — DIPHENHYDRAMINE HCL 25 MG PO CAPS
25.0000 mg | ORAL_CAPSULE | Freq: Once | ORAL | Status: AC
  Administered 2024-06-05: 25 mg via ORAL
  Filled 2024-06-05: qty 1

## 2024-06-05 MED ORDER — FERROUS SULFATE 325 (65 FE) MG PO TABS
325.0000 mg | ORAL_TABLET | Freq: Every day | ORAL | Status: DC
  Administered 2024-06-06 – 2024-06-10 (×5): 325 mg via ORAL
  Filled 2024-06-05 (×5): qty 1

## 2024-06-05 MED ORDER — TRAVASOL 10 % IV SOLN
INTRAVENOUS | Status: AC
  Filled 2024-06-05: qty 561.6

## 2024-06-05 MED ORDER — DIPHENHYDRAMINE HCL 25 MG PO CAPS
25.0000 mg | ORAL_CAPSULE | Freq: Once | ORAL | Status: AC | PRN
Start: 2024-06-05 — End: 2024-06-05
  Administered 2024-06-05: 25 mg via ORAL
  Filled 2024-06-05: qty 1

## 2024-06-05 MED ORDER — ACETAMINOPHEN 325 MG PO TABS
650.0000 mg | ORAL_TABLET | Freq: Once | ORAL | Status: AC
  Administered 2024-06-05: 650 mg via ORAL
  Filled 2024-06-05: qty 2

## 2024-06-05 MED ORDER — IOHEXOL 350 MG/ML SOLN
75.0000 mL | Freq: Once | INTRAVENOUS | Status: AC | PRN
  Administered 2024-06-05: 75 mL via INTRAVENOUS

## 2024-06-05 MED ORDER — MAGNESIUM SULFATE 4 GM/100ML IV SOLN
4.0000 g | Freq: Once | INTRAVENOUS | Status: AC
  Administered 2024-06-05: 4 g via INTRAVENOUS
  Filled 2024-06-05: qty 100

## 2024-06-05 MED ORDER — CALCIUM POLYCARBOPHIL 625 MG PO TABS
625.0000 mg | ORAL_TABLET | Freq: Every day | ORAL | Status: DC
  Administered 2024-06-05 – 2024-06-09 (×4): 625 mg via ORAL
  Filled 2024-06-05 (×7): qty 1

## 2024-06-05 MED ORDER — SODIUM CHLORIDE 0.9% IV SOLUTION: Freq: Once | INTRAVENOUS | Status: AC

## 2024-06-05 NOTE — Progress Notes (Signed)
 [facility] Progress Note     Subjective: Doing well.  Pain controlled.  Eating fairly well according to him - unable to be specific, says he gets full easily but sometimes eats 50% of his plate.   Objective: Vital signs in last 24 hours: Temp:  [98 F (36.7 C)-98.2 F (36.8 C)] 98.2 F (36.8 C) (09/05 0824) Pulse Rate:  [74-89] 74 (09/05 0824) Resp:  [16] 16 (09/05 0056) BP: (103-115)/(48-63) 103/57 (09/05 0056) SpO2:  [94 %] 94 % (09/05 0056) Weight:  [69 kg] 69 kg (09/05 0459) Last BM Date : 06/04/24  Intake/Output from previous day: 09/04 0701 - 09/05 0700 In: 1951.1 [P.O.:100; I.V.:1325.4; IV Piggyback:525.7] Out: 3775 [Urine:1900; Stool:1875] Intake/Output this shift: Total I/O In: -  Out: 475 [Urine:300; Stool:175]  PE: Gen:  Alert, NAD, pleasant Card:  Regular rate and rhythm Pulm:  Normal effort ORA Abd: Soft, non-tender, non-distended, midline incisions with interval formation of granulation tissue. Ileostomy with semi-solid stool (1875 documented)   Psych: A&Ox3   Lab Results:  Recent Labs    06/04/24 0927 06/05/24 0241  WBC 6.1 6.5  HGB 7.5* 7.2*  HCT 24.1* 24.0*  PLT 371 380   BMET Recent Labs    06/04/24 0038 06/05/24 0241  NA 138 138  K 4.5 4.5  CL 105 103  CO2 23 25  GLUCOSE 131* 120*  BUN 10 12  CREATININE 0.80 0.81  CALCIUM  9.0 9.1   PT/INR No results for input(s): LABPROT, INR in the last 72 hours.  CMP     Component Value Date/Time   NA 138 06/05/2024 0241   NA 141 01/22/2020 1137   K 4.5 06/05/2024 0241   CL 103 06/05/2024 0241   CO2 25 06/05/2024 0241   GLUCOSE 120 (H) 06/05/2024 0241   BUN 12 06/05/2024 0241   BUN 13 01/22/2020 1137   CREATININE 0.81 06/05/2024 0241   CREATININE 0.98 06/14/2017 0935   CALCIUM  9.1 06/05/2024 0241   PROT 6.6 06/04/2024 0038   PROT 7.3 01/22/2020 1137   ALBUMIN  2.4 (L) 06/04/2024 0038   ALBUMIN  4.2 01/22/2020 1137   AST 18 06/04/2024 0038   ALT 13 06/04/2024 0038    ALKPHOS 47 06/04/2024 0038   BILITOT 0.3 06/04/2024 0038   BILITOT 0.4 01/22/2020 1137   GFRNONAA >60 06/05/2024 0241   GFRNONAA >89 09/17/2014 1253   GFRAA 111 01/22/2020 1137   GFRAA >89 09/17/2014 1253   Lipase     Component Value Date/Time   LIPASE 28 04/03/2024 0200       Studies/Results: No results found.  Anti-infectives: Anti-infectives (From admission, onward)    Start     Dose/Rate Route Frequency Ordered Stop   05/29/24 1330  linezolid  (ZYVOX ) IVPB 600 mg        600 mg 300 mL/hr over 60 Minutes Intravenous Every 12 hours 05/29/24 1230     05/28/24 2000  piperacillin -tazobactam (ZOSYN ) IVPB 3.375 g        3.375 g 12.5 mL/hr over 240 Minutes Intravenous Every 8 hours 05/28/24 0534     05/28/24 0900  metroNIDAZOLE  (FLAGYL ) IVPB 500 mg        500 mg 100 mL/hr over 60 Minutes Intravenous  Once 05/28/24 0534 05/28/24 1104   05/28/24 0630  DAPTOmycin  (CUBICIN ) IVPB 700 mg/111mL premix        10 mg/kg  68 kg 200 mL/hr over 30 Minutes Intravenous  Once 05/28/24 0627 05/28/24 0754   05/27/24 1945  cefTRIAXone  (ROCEPHIN )  2 g in sodium chloride  0.9 % 100 mL IVPB        2 g 200 mL/hr over 30 Minutes Intravenous Once 05/27/24 1934 05/27/24 2125   05/27/24 1945  metroNIDAZOLE  (FLAGYL ) IVPB 500 mg        500 mg 100 mL/hr over 60 Minutes Intravenous  Once 05/27/24 1934 05/27/24 2236        Assessment/Plan  39M with complex medical and surgical history, most recent surgery 04/13/24 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 Tanda Regal bowel inflammation with concern for possible perforation - known hostile abdomen - nausea and vomiting, small bowel enteritis and small volume pneumoperitoneum in the RUQ on CT 8/27 - CT 8/28 with concern for inflamed small bowel loop on the right with concern for contained perforation but also possible contrast leak vs contrast within decompressed loop of small  bowel  - SBFT 8/28 without  contrast extravasation  - CT 8/31 - same to slightly improved.  - tolerating Soft diet, calorie count, ok to decrease TPN to 1/2.  - repeat CT scan today to follow up on intra-abdominal collections.  - continue abx - pt remains hemodynamically stable, WBC WNL  - hgb 7.2, medicine has ordered a unit of RBC.      FEN: soft diet, PICC and TPN, ok to wean TPN to 1/2 support; IVF per TRH; monitor ileostomy output - was high today and has not been previously. Start fiber supplement and daily iron . VTE: heparin  gtt; continue to hold warfarin ID: Zosyn  8/28>>   - per TRH -  Hx of PE/DVT Pulmonary HTN PAD HTN GERD - PPI Gout   LOS: 8 days   Almarie GORMAN Pringle, [facility] Please see Amion for pager number during day hours 7:00am-4:30pm

## 2024-06-05 NOTE — Progress Notes (Signed)
 PHARMACY - ANTICOAGULATION CONSULT NOTE  Pharmacy Consult:  Heparin  Indication: VTE treatment (warfarin PTA on hold)  No Known Allergies  Patient Measurements: Height: 5' 9 (175.3 cm) Weight: 69 kg (152 lb 1.9 oz) IBW/kg (Calculated) : 70.7 HEPARIN  DW (KG): 68  Vital Signs: Temp: 98.2 F (36.8 C) (09/05 0824) Temp Source: Oral (09/05 0824) BP: 103/57 (09/05 0056) Pulse Rate: 74 (09/05 0824)  Labs: Recent Labs    06/03/24 0331 06/04/24 0038 06/04/24 0927 06/05/24 0241  HGB 7.6*  --  7.5* 7.2*  HCT 24.6*  --  24.1* 24.0*  PLT 308  --  371 380  HEPARINUNFRC 0.65 0.48  --  0.61  CREATININE 0.70 0.80  --  0.81    Estimated Creatinine Clearance: 92.3 mL/min (by C-G formula based on SCr of 0.81 mg/dL).  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 Using heparin  as a bridge until taking po medications consistently.  Heparin  level remains therapeutic on infusion at 2200 units/hr.    Goal of Therapy:  Heparin  level 0.3-0.7 units/ml Monitor platelets by anticoagulation protocol: Yes   Plan:  Continue heparin  infusion at 2200 units/hr Daily heparin  level and CBC Monitor for signs and symptoms of bleeding   Thank you. Olam Monte, PharmD

## 2024-06-05 NOTE — TOC Progression Note (Signed)
 Transition of Care [facility]) - Progression Note    Patient Details  Name: Alexander [name] MRN: [id number] Date of Birth: 05/01/1961  [facility] of [facility] CM/SW Contact  Inocente GORMAN Kindle, LCSW Phone Number: 06/05/2024, 8:36 AM  Clinical Narrative:    CSW continuing to follow for medical progression. Patient will require insurance approval to return to [facility].   Expected Discharge Plan: Skilled Nursing Facility Barriers to Discharge: Continued Medical Work up, English as a second language teacher               Expected Discharge Plan and Services In-house Referral: Clinical Social Work   Post Acute Care Choice: Skilled Nursing Facility Living arrangements for the past 2 months: Single Family Home, Skilled Nursing Facility                                       Social Drivers of Health (SDOH) Interventions SDOH Screenings   Food Insecurity: Patient Unable To Answer (05/28/2024)  Housing: Unknown (05/28/2024)  Recent Concern: Housing - High Risk (04/10/2024)  Transportation Needs: Patient Unable To Answer (05/28/2024)  Recent Concern: Transportation Needs - Unmet Transportation Needs (03/30/2024)  Utilities: Not At Risk (05/28/2024)  Depression (PHQ2-9): Low Risk  (07/24/2023)  Financial Resource Strain: Low Risk  (05/01/2024)   Received from [facility]  Social Connections: Moderately Isolated (05/01/2024)   Received from [facility]  Stress: No Stress Concern Present (05/13/2024)   Received from [facility]  Tobacco Use: Medium Risk (05/27/2024)    Readmission Risk Interventions    05/28/2024    2:04 PM 04/30/2024   12:54 PM 04/10/2024    1:55 PM  Readmission Risk Prevention Plan  Transportation Screening Complete Complete Complete  Medication Review Oceanographer) Complete Complete Complete  PCP or Specialist appointment within 3-5 days of discharge Complete Complete Complete  HRI or Home Care Consult Complete Complete Complete  SW Recovery Care/Counseling Consult  Complete Complete Complete  Palliative Care Screening Not Applicable Not Applicable Not Applicable  Skilled Nursing Facility Complete Not Applicable Not Applicable

## 2024-06-05 NOTE — Progress Notes (Signed)
 PROGRESS NOTE                                                                                                                                                                                                             Patient Demographics:    Alexander [name], is a 63 y.o. male, DOB - 24-Jun-1961, FMW:[id number]  Outpatient Primary MD for the patient is [name], [name]    LOS - 8  Admit date - 05/27/2024    Chief Complaint  Patient presents with   Abdominal Pain   Nausea   Weakness   Emesis       Brief Narrative (HPI from H&P)    63 y.o. male with complex hx in 4/'25 developed colonic perforation I/s/o diverticulitis requiring emergent ex lap initially left in discontinuity and required additional subsequent SBR and ileostomy, complicated postoperative course including cardiac arrest, shock, renal failure requiring CRRT, intrabdominal abscess requiring drainage including VRE intraabdominal infection (S to Daptomycin ), ultimately discharged to [facility], and readmitted in 7/'25 with wound dehiscence and underwent ex lap 7/14 with extensive LOA, sigmoid colectomy and ileocolonic anastamosis and creation of a loop ileostomy, and 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 Presented with acute onset of N/V, abd pain, imaging was suggestive of some free air in the abdomen which probably contained perforation, seen by general surgery and admitted to the hospital.   Subjective:   Reports he is tolerating soft diet, pain is controlled.   Assessment  & Plan :   Sepsis, nausea vomiting, secondary to possible contained intra-abdominal perforation.  Patient with history of multiple abdominal surgeries and recent intra-abdominal abscess, has ileostomy.     -He has been seen by general surgery, he has complicated history of  multiple abdominal surgeries, recent intra-abdominal abscess, ileostomy.  There is suspicion of contained perforation, he has been treated conservatively with bowel rest, IV fluids, TNA, pain control and antibiotics.  Will defer management of this issue to general surgery who is following the patient.  Plan to repeat CT abdomen pelvis today, he is on soft diet, followed by nutrition where he is on calorie count currently, general surgery to decrease his TPN by half today - Tolerating soft diet, remains on TPN, currently on calorie count . -  Note patient has chronic pain and is on high doses of narcotics at home, use with caution as blood pressure borderline low.  Sepsis pathophysiology is improving, blood cultures negative final.    Prolonged Qtc  Avoid QT prolonging medications, replace electrolytes, monitor on telemetry.   Incidental rhinovirus URI.  Asymptomatic.  Monitor.    Hypomagnesemia and hypokalemia.  Replaced.    Chronic medical problems: Reintroduce oral medications as he is more consistent with oral diet.  Complex Surgical Hx per above, noted   DVT/PE: Hold Warfarin in case requires surgery. INR < 2 at present. Trend daily. Continue  Heparin  drip.  Monitor CBC closely.  Diastolic HF: Without exacerbation, dry clinically.   HTN: not on antiHTN at time of admission   Anemia: - Baseline around 8, continue to trend down, 87.2 this morning, discussed with the patient, agreeable to receive 1 unit PRBC, as his hemoglobin keeps trending down while he is on heparin  drip.  Chronic pain: Home regimen is Dilaudid  2 mg PO q 4 hr prn, see acute pain management above. Holding home Gabapentin .   ?insomnia/mood: holding trazodone    ?? ASCVD: Hold aspirin ; unclear indication since on Warfarin per above      Condition - Extremely Guarded  Family Communication  : None present  Code Status : Full code  Consults  : General surgery  PUD Prophylaxis : PPI   Procedures  :     Small  bowel follow-through 05/28/2024.  No extravasation of contrast.    CT 05/28/2024- 1. Findings which remain concerning for the presence of contained free air within the mid right abdomen and right upper quadrant, as described above. 2. Markedly inflamed, partially opacified small bowel loops within the mid and upper right abdomen. 3. Curvilinear area of oral contrast within the lateral aspect of the mid to lower right abdomen which may be within a decompressed small bowel loop, however, intraluminal location cannot completely be confirmed. 4. Stable moderate to marked severity areas of atelectasis and/or infiltrate within the bilateral lower lobes. 5. Colonic diverticulosis. 6. Aortic atherosclerosis.      Disposition Plan  :    Status is: Inpatient  DVT Prophylaxis  :  Hep gtt  SCDs Start: 05/28/24 0530    Lab Results  Component Value Date   PLT 380 06/05/2024    Diet :  Diet Order             DIET SOFT Room service appropriate? Yes; Fluid consistency: Thin  Diet effective now                    Inpatient Medications  Scheduled Meds:  (feeding supplement) PROSource Plus  30 mL Oral BID AC & HS   sodium chloride    Intravenous Once   Chlorhexidine  Gluconate Cloth  6 each Topical Daily   [START ON 06/06/2024] ferrous sulfate   325 mg Oral Q breakfast   gabapentin   200 mg Oral TID   magic mouthwash  5 mL Oral QID   pantoprazole  (PROTONIX ) IV  40 mg Intravenous Q12H   polycarbophil  625 mg Oral Daily   sodium chloride  flush  10-40 mL Intracatheter Q12H   sodium chloride  flush  3 mL Intravenous Q12H   traZODone   100 mg Oral QHS   Continuous Infusions:  heparin  2,200 Units/hr (06/05/24 1100)   linezolid  (ZYVOX ) IV 300 mL/hr at 06/05/24 1100   magnesium  sulfate bolus IVPB     piperacillin -tazobactam (ZOSYN )  IV Stopped (06/05/24 0823)  TPN ADULT (ION) 85 mL/hr at 06/05/24 1100   TPN ADULT (ION)     PRN Meds:.acetaminophen , albuterol , morphine  injection **OR** morphine   injection, sodium chloride  flush, trimethobenzamide     Objective:   Vitals:   06/05/24 0056 06/05/24 0459 06/05/24 0824 06/05/24 1145  BP: (!) 103/57   114/61  Pulse: 89  74 78  Resp: 16     Temp: 98.2 F (36.8 C)  98.2 F (36.8 C) 98.3 F (36.8 C)  TempSrc: Oral  Oral Oral  SpO2: 94%     Weight:  69 kg    Height:        Wt Readings from Last 3 Encounters:  06/05/24 69 kg  04/29/24 55 kg  04/06/24 71 kg     Intake/Output Summary (Last 24 hours) at 06/05/2024 1302 Last data filed at 06/05/2024 1100 Gross per 24 hour  Intake 2728.29 ml  Output 3600 ml  Net -871.71 ml     Physical Exam  Awake Alert, Oriented X 3, No new F.N deficits, Normal affect Symmetrical Chest wall movement, Good air movement bilaterally, CTAB RRR,No Gallops,Rubs or new Murmurs, No Parasternal Heave +ve B.Sounds, Abd Soft, ileostomy present, abdominal wound covered. No Cyanosis, Clubbing or edema, No new Rash or bruise           Data Review:    Recent Labs  Lab 05/30/24 0055 05/31/24 0156 06/01/24 0252 06/02/24 0332 06/03/24 0331 06/04/24 0927 06/05/24 0241  WBC 7.6 5.5 5.6 6.1 6.1 6.1 6.5  HGB 7.7* 7.6* 8.1* 7.8* 7.6* 7.5* 7.2*  HCT 24.4* 24.1* 25.6* 25.3* 24.6* 24.1* 24.0*  PLT 250 268 293 292 308 371 380  MCV 95.3 94.5 95.2 96.6 96.5 98.0 100.4*  MCH 30.1 29.8 30.1 29.8 29.8 30.5 30.1  MCHC 31.6 31.5 31.6 30.8 30.9 31.1 30.0  RDW 16.1* 15.9* 16.0* 16.4* 16.6* 17.2* 17.2*  LYMPHSABS 0.6* 0.9 1.2 0.8 1.4  --   --   MONOABS 0.3 0.5 0.6 0.4 0.5  --   --   EOSABS 0.1 0.1 0.1 0.1 0.0  --   --   BASOSABS 0.0 0.0 0.0 0.1 0.0  --   --     Recent Labs  Lab 05/30/24 0055 05/31/24 0156 06/01/24 0252 06/02/24 0332 06/03/24 0331 06/04/24 0038 06/05/24 0241  NA 133* 134* 134* 138 139 138 138  K 3.2* 3.7 4.4 4.1 4.3 4.5 4.5  CL 101 102 101 102 104 105 103  CO2 24 24 28 26 25 23 25   ANIONGAP 8 8 5 10 10 10 10   GLUCOSE 174* 121* 116* 128* 119* 131* 120*  BUN 8 7* <5* 8 8 10 12    CREATININE 0.65 0.54* 0.53* 0.62 0.70 0.80 0.81  AST 19 12* 12* 12* 14* 18  --   ALT 12 9 10 9 11 13   --   ALKPHOS 54 48 49 44 48 47  --   BILITOT 0.3 0.2 0.2 0.3 0.3 0.3  --   ALBUMIN  2.0* 1.9* 2.1* 2.3* 2.3* 2.4*  --   CRP 20.6* 10.7* 7.4* 4.2*  --   --   --   PROCALCITON 2.05 1.08 0.53 0.29 0.15  --   --   INR 1.2 1.1 1.0  --   --   --   --   MG 1.8 1.7 1.8 1.9  --  1.8 1.8  PHOS 2.9 3.6 5.2* 4.5  --  3.4  --   CALCIUM  8.3* 8.4* 8.7* 8.7* 8.9 9.0 9.1  Recent Labs  Lab 05/30/24 0055 05/31/24 0156 06/01/24 0252 06/02/24 0332 06/03/24 0331 06/04/24 0038 06/05/24 0241  CRP 20.6* 10.7* 7.4* 4.2*  --   --   --   PROCALCITON 2.05 1.08 0.53 0.29 0.15  --   --   INR 1.2 1.1 1.0  --   --   --   --   MG 1.8 1.7 1.8 1.9  --  1.8 1.8  CALCIUM  8.3* 8.4* 8.7* 8.7* 8.9 9.0 9.1    --------------------------------------------------------------------------------------------------------------- Lab Results  Component Value Date   CHOL 114 12/03/2023   HDL 43.40 12/03/2023   LDLCALC 58 12/03/2023   TRIG 45 06/01/2024   CHOLHDL 3 12/03/2023    Lab Results  Component Value Date   HGBA1C 5.9 12/03/2023   No results for input(s): TSH, T4TOTAL, FREET4, T3FREE, THYROIDAB in the last 72 hours. No results for input(s): VITAMINB12, FOLATE, FERRITIN, TIBC, IRON , RETICCTPCT in the last 72 hours. ------------------------------------------------------------------------------------------------------------------ Cardiac Enzymes No results for input(s): CKMB, TROPONINI, MYOGLOBIN in the last 168 hours.  Invalid input(s): CK  Micro Results Recent Results (from the past 240 hours)  Resp panel by RT-PCR (RSV, Flu A&B, Covid) Anterior Nasal Swab     Status: None   Collection Time: 05/27/24  7:49 PM   Specimen: Anterior Nasal Swab  Result Value Ref Range Status   SARS Coronavirus 2 by RT PCR NEGATIVE NEGATIVE Final   Influenza A by PCR NEGATIVE NEGATIVE Final    Influenza B by PCR NEGATIVE NEGATIVE Final    Comment: (NOTE) The Xpert Xpress SARS-CoV-2/FLU/RSV plus assay is intended as an aid in the diagnosis of influenza from Nasopharyngeal swab specimens and should not be used as a sole basis for treatment. Nasal washings and aspirates are unacceptable for Xpert Xpress SARS-CoV-2/FLU/RSV testing.  Fact Sheet for Patients: BloggerCourse.com  Fact Sheet for Healthcare Providers: SeriousBroker.it  This test is not yet approved or cleared by the [place]  FDA and has been authorized for detection and/or diagnosis of SARS-CoV-2 by FDA under an Emergency Use Authorization (EUA). This EUA will remain in effect (meaning this test can be used) for the duration of the COVID-19 declaration under Section 564(b)(1) of the Act, 21 U.S.C. section 360bbb-3(b)(1), unless the authorization is terminated or revoked.     Resp Syncytial Virus by PCR NEGATIVE NEGATIVE Final    Comment: (NOTE) Fact Sheet for Patients: BloggerCourse.com  Fact Sheet for Healthcare Providers: SeriousBroker.it  This test is not yet approved or cleared by the [place]  FDA and has been authorized for detection and/or diagnosis of SARS-CoV-2 by FDA under an Emergency Use Authorization (EUA). This EUA will remain in effect (meaning this test can be used) for the duration of the COVID-19 declaration under Section 564(b)(1) of the Act, 21 U.S.C. section 360bbb-3(b)(1), unless the authorization is terminated or revoked.  Performed at [facility], [address], [place], [place] [address]   Blood Culture (routine x 2)     Status: None   Collection Time: 05/27/24  8:00 PM   Specimen: BLOOD LEFT ARM  Result Value Ref Range Status   Specimen Description BLOOD LEFT ARM  Final   Special Requests   Final    BOTTLES DRAWN AEROBIC AND ANAEROBIC Blood Culture adequate  volume   Culture   Final    NO GROWTH 5 DAYS Performed at [facility], [address], [place], [place] [address]    Report Status 06/01/2024 FINAL  Final  Blood Culture (routine  x 2)     Status: None   Collection Time: 05/27/24  8:44 PM   Specimen: BLOOD RIGHT ARM  Result Value Ref Range Status   Specimen Description BLOOD RIGHT ARM  Final   Special Requests   Final    BOTTLES DRAWN AEROBIC AND ANAEROBIC Blood Culture adequate volume   Culture   Final    NO GROWTH 5 DAYS Performed at [facility], [address], [place], [place] [address]    Report Status 06/01/2024 FINAL  Final  Respiratory (~20 pathogens) panel by PCR     Status: Abnormal   Collection Time: 05/28/24  8:58 AM   Specimen: Nasopharyngeal Swab; Respiratory  Result Value Ref Range Status   Adenovirus NOT DETECTED NOT DETECTED Final   Coronavirus 229E NOT DETECTED NOT DETECTED Final    Comment: (NOTE) The Coronavirus on the Respiratory Panel, DOES NOT test for the novel  Coronavirus (2019 nCoV)    Coronavirus HKU1 NOT DETECTED NOT DETECTED Final   Coronavirus NL63 NOT DETECTED NOT DETECTED Final   Coronavirus OC43 NOT DETECTED NOT DETECTED Final   Metapneumovirus NOT DETECTED NOT DETECTED Final   Rhinovirus / Enterovirus DETECTED (A) NOT DETECTED Final   Influenza A NOT DETECTED NOT DETECTED Final   Influenza B NOT DETECTED NOT DETECTED Final   Parainfluenza Virus 1 NOT DETECTED NOT DETECTED Final   Parainfluenza Virus 2 NOT DETECTED NOT DETECTED Final   Parainfluenza Virus 3 NOT DETECTED NOT DETECTED Final   Parainfluenza Virus 4 NOT DETECTED NOT DETECTED Final   Respiratory Syncytial Virus NOT DETECTED NOT DETECTED Final   Bordetella pertussis NOT DETECTED NOT DETECTED Final   Bordetella Parapertussis NOT DETECTED NOT DETECTED Final   Chlamydophila pneumoniae NOT DETECTED NOT DETECTED Final   Mycoplasma pneumoniae NOT DETECTED NOT DETECTED Final    Comment: Performed at [facility]  Lab, [address] [place], [place] [address]    Radiology Report No results found.    Signature  -   Brayton Lye M.D on 06/05/2024 at 1:02 PM   -  To page go to [email or web address]

## 2024-06-05 NOTE — Progress Notes (Signed)
 Calorie Count Note  48-hour calorie count ordered. Day 2 now complete and can be reviewed below.   Estimated Nutritional Needs:  Kcal:  2100-2300 Protein:  105-125 grams Fluid:  >/= 2 L   He is NOT consistently sufficiently meeting his estimated calorie and protein needs. Of note, TPN reduced to 50% of estimated needs today.  Inadequate documentation collected on Day 2 of calorie count. Can only review what RN reported yesterday throughout the day.   Per surgery, patient also unable to quantify how much he is eating. Endorsing early satiety. Discussed with surgery PA-C the preference to continue nutrition support as long as possible, however understand risks associated with prolonged TPN administration. Inquired as to whether diet could be advanced to regular texture to promote variety.   Per RN today, patient requesting graham crackers and now refusing Prosource. Will discontinue d/t patient preference. 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 Has been reporting adequate intake/appetite for some time without sufficient intake.    Recommend continuing TPN until meeting >50% of his estimated needs consistently. CONTINUE TO RECOMMEND TPN TO MEET AT MINIMUM 75% OF ESTIMATED NEEDS FOR AS LONG AS POSSIBLE.      Diet: Soft diet, thin liquids Supplements: Magic Cup x3, Prosource x2   Day 2(9/4-9/5) Breakfast: 100% Magic Cup (290 kcals, 9g protein) Lunch: 75% of bacon and 100% grits (445 kcals, 7g protein) Dinner: No documentation received Supplements: 100% Prosource x1 (100 kcals, 15g protein)   Day 2 Total Intake: 835 kcal (40% of minimum estimated needs)  31g protein (30% of minimum estimated needs)  Day 1 Total Intake: 805 kcal (38% of minimum estimated needs)  36g protein (34% of minimum estimated needs)  AVERAGE: 39% of estimated calorie needs and 32% of estimated protein needs   INTERVENTION:  TPN modified to  meet 50% of pt estimated needs  Management per pharmacy MVI w/ trace elements added   Discontinue calorie count   Monitor diet advancement and tolerance   Discontinue Ensure Plus High Protein po BID, each supplement provides 350 kcal and 20 grams of protein    Continue Magic cup TID with meals, each supplement provides 290 kcal and 9 grams of protein   Discontinue 30 ml ProSource Plus BID with breakfast and at bedtime, each supplement provides 100 kcals and 15 grams protein.     Transition to oral MVI once off TPN   NUTRITION DIAGNOSIS:  Inadequate oral intake related to inability to eat as evidenced by NPO status. - patient diet now advanced to regular texture   GOAL:  Patient will meet greater than or equal to 90% of their needs - progressing   MONITOR:  Labs, I & O's, Weight trends, Diet advancement  Alexander Deaner MS, RD, LDN Registered Dietitian Clinical Nutrition RD Inpatient Contact Info in Amion

## 2024-06-05 NOTE — Progress Notes (Signed)
 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 Usual Weight: 68.2 Kg  Assessment:  63 yo M with complex surgical history including 12/2023 developed colonic perforation I/s/o diverticulitis requiring emergent ex lap initially left in discontinuity and required additional subsequent SBR and ileostomy, complicated postoperative course including cardiac arrest, shock, renal failure requiring CRRT, intrabdominal abscess requiring drainage including VRE intraabdominal infection. Most recent surgery 04/13/24 with ileocolic anastomosis and diverting loop ileostomy for fascial dehiscence. Admitted 8/27 with NVD and concern for perforated small bowel. Pharmacy consulted for TPN.  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 Tanda - return 9/27 for further eval of abd wound dehiscence  Central access: PICC placed 05/29/2024 TPN start date: 05/29/2024  Nutritional Goals: Goal TPN rate is 85 mL/hr (52 g/L AA, 15% CHO, 32 g/L ILE to provide 107g AA and 2117 kcals per day)  RD Estimated Needs Total Energy Estimated Needs: 2100-2300 Total Protein Estimated Needs: 105-125 grams Total Fluid Estimated Needs: >/= 2 L  Current Nutrition:  TPN Soft  diet since 9/2 - eating well per pt; could not quantity amount and was irritable  ProSource Plus BID - 2 charted on 9/4 9/4 calorie count: inadequate intake  Plan:  Reduce TPN to half per Surgery - TPN at 45 ml/hr starting at 1800 Electrolytes in TPN: Na 150 mEq/L, K 65 mEq/L, Ca 2 mEq/L, Mag 55mEq/L, Phos 20 mmol/L, Cl:Ac 1:1 - lytes adjusted with reduced TPN rate. Add standard MVI and trace elements to TPN Mag sulfate 4gm IV x 1 with less in TPN Monitor TPN labs on Mon/Thurs - labs in AM F/u PO intake and calorie count started 9/3, wean TPN off as able  F/u repeat CT  Kaila Devries D. [place], PharmD, BCPS, BCCCP 06/05/2024, 11:05 AM

## 2024-06-06 ENCOUNTER — Other Ambulatory Visit (HOSPITAL_COMMUNITY): Payer: Self-pay

## 2024-06-06 DIAGNOSIS — K668 Other specified disorders of peritoneum: Secondary | ICD-10-CM | POA: Diagnosis not present

## 2024-06-06 LAB — CBC
HCT: 28.6 % — ABNORMAL LOW (ref 39.0–52.0)
Hemoglobin: 8.8 g/dL — ABNORMAL LOW (ref 13.0–17.0)
MCH: 29.8 pg (ref 26.0–34.0)
MCHC: 30.8 g/dL (ref 30.0–36.0)
MCV: 96.9 fL (ref 80.0–100.0)
Platelets: 370 K/uL (ref 150–400)
RBC: 2.95 MIL/uL — ABNORMAL LOW (ref 4.22–5.81)
RDW: 18.7 % — ABNORMAL HIGH (ref 11.5–15.5)
WBC: 8.4 K/uL (ref 4.0–10.5)
nRBC: 0.4 % — ABNORMAL HIGH (ref 0.0–0.2)

## 2024-06-06 LAB — TYPE AND SCREEN
ABO/RH(D): B POS
Antibody Screen: NEGATIVE
Unit division: 0

## 2024-06-06 LAB — BASIC METABOLIC PANEL WITH GFR
Anion gap: 10 (ref 5–15)
BUN: 12 mg/dL (ref 8–23)
CO2: 25 mmol/L (ref 22–32)
Calcium: 9.1 mg/dL (ref 8.9–10.3)
Chloride: 100 mmol/L (ref 98–111)
Creatinine, Ser: 0.82 mg/dL (ref 0.61–1.24)
GFR, Estimated: >60 mL/min (ref >60)
Glucose, Bld: 109 mg/dL — ABNORMAL HIGH (ref 70–99)
Potassium: 4.3 mmol/L (ref 3.5–5.1)
Sodium: 135 mmol/L (ref 135–145)

## 2024-06-06 LAB — BPAM RBC
Blood Product Expiration Date: 202509302359
ISSUE DATE / TIME: 202509051458
Unit Type and Rh: 7300

## 2024-06-06 LAB — MAGNESIUM: Magnesium: 2.3 mg/dL (ref 1.7–2.4)

## 2024-06-06 LAB — PHOSPHORUS: Phosphorus: 5.1 mg/dL — ABNORMAL HIGH (ref 2.5–4.6)

## 2024-06-06 LAB — HEPARIN LEVEL (UNFRACTIONATED)
Heparin Unfractionated: 0.41 [IU]/mL (ref 0.30–0.70)
Heparin Unfractionated: 0.45 [IU]/mL (ref 0.30–0.70)

## 2024-06-06 MED ORDER — DIPHENHYDRAMINE HCL 25 MG PO CAPS
25.0000 mg | ORAL_CAPSULE | Freq: Three times a day (TID) | ORAL | Status: DC | PRN
  Administered 2024-06-06 – 2024-06-11 (×11): 25 mg via ORAL
  Filled 2024-06-06 (×12): qty 1

## 2024-06-06 MED ORDER — LOPERAMIDE HCL 2 MG PO CAPS
2.0000 mg | ORAL_CAPSULE | Freq: Three times a day (TID) | ORAL | Status: DC
  Administered 2024-06-06 – 2024-06-07 (×3): 2 mg via ORAL
  Filled 2024-06-06 (×3): qty 1

## 2024-06-06 MED ORDER — TRAVASOL 10 % IV SOLN
INTRAVENOUS | Status: AC
  Filled 2024-06-06: qty 811.2

## 2024-06-06 MED ORDER — APIXABAN 5 MG PO TABS
5.0000 mg | ORAL_TABLET | Freq: Two times a day (BID) | ORAL | Status: DC
  Administered 2024-06-06 – 2024-06-10 (×9): 5 mg via ORAL
  Filled 2024-06-06 (×10): qty 1

## 2024-06-06 NOTE — Progress Notes (Signed)
 PHARMACY - ANTICOAGULATION CONSULT NOTE  Pharmacy Consult:  Apixaban  Indication: VTE treatment  No Known Allergies  Patient Measurements: Height: 5' 9 (175.3 cm) Weight: 67.7 kg (149 lb 4 oz) IBW/kg (Calculated) : 70.7 HEPARIN  DW (KG): 68  Vital Signs: Temp: 98 F (36.7 C) (09/06 1120) Temp Source: Oral (09/06 1120) BP: 138/43 (09/06 0917) Pulse Rate: 78 (09/06 0917)  Labs: Recent Labs    06/04/24 0038 06/04/24 0038 06/04/24 9072 06/05/24 0241 06/05/24 1833 06/06/24 0017 06/06/24 0800  HGB  --    < > 7.5* 7.2* 8.8* 8.8*  --   HCT  --    < > 24.1* 24.0* 28.4* 28.6*  --   PLT  --   --  371 380  --  370  --   HEPARINUNFRC 0.48  --   --  0.61  --  0.45 0.41  CREATININE 0.80  --   --  0.81  --  0.82  --    < > = values in this interval not displayed.    Estimated Creatinine Clearance: 89.4 mL/min (by C-G formula based on SCr of 0.82 mg/dL).  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 Heparin  started as a bridge until taking po medications consistently. PTA use of warfarin due to cost concerns but [facility] pharmacy price check confirmed Eliquis  copay $0. In light of this, will plan to switch from heparin  to Eliquis .  Goal of Therapy:  Heparin  level 0.3-0.7 units/ml Monitor platelets by anticoagulation protocol: Yes   Plan:  Discontinue heparin  Begin Eliquis  5mg  BID starting tonight  Prentice DOROTHA Favors, PharmD PGY1 [facility] Pharmacy Administration and [facility]  06/06/2024 4:10 PM

## 2024-06-06 NOTE — Progress Notes (Signed)
 Subjective/Chief Complaint: Tolerating solid diet Weaning off TPN Ileostomy - 1817 cc output - liquid Feels occasional abdominal cramping   Objective: Vital signs in last 24 hours: Temp:  [98 F (36.7 C)-98.6 F (37 C)] 98.4 F (36.9 C) (09/06 0306) Pulse Rate:  [75-80] 75 (09/06 0306) Resp:  [12] 12 (09/05 1535) BP: (104-115)/(51-65) 104/57 (09/06 0306) SpO2:  [95 %] 95 % (09/06 0306) Weight:  [67.7 kg] 67.7 kg (09/06 0500) Last BM Date : 06/05/24  Intake/Output from previous day: 09/05 0701 - 09/06 0700 In: 3375.8 [P.O.:240; I.V.:1932.9; Blood:300; IV Piggyback:902.9] Out: 4192 [Urine:2375; Stool:1817] Intake/Output this shift: Total I/O In: -  Out: 75 [Stool:75]  Gen:  Alert, NAD, pleasant Card:  Regular rate and rhythm Pulm:  Normal effort ORA Abd: Soft, non-tender, non-distended, midline incisions with interval formation of granulation tissue. Ileostomy with semi-solid stool (1817 documented)  Lab Results:  Recent Labs    06/05/24 0241 06/05/24 1833 06/06/24 0017  WBC 6.5  --  8.4  HGB 7.2* 8.8* 8.8*  HCT 24.0* 28.4* 28.6*  PLT 380  --  370   BMET Recent Labs    06/05/24 0241 06/06/24 0017  NA 138 135  K 4.5 4.3  CL 103 100  CO2 25 25  GLUCOSE 120* 109*  BUN 12 12  CREATININE 0.81 0.82  CALCIUM  9.1 9.1    Studies/Results: CT ABDOMEN PELVIS W CONTRAST Result Date: 06/05/2024 CLINICAL DATA:  History of partial colectomy and wound dehiscence status post ileocolic anastomosis and diverting loop ileostomy with concern for small bowel perforation and 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 EXAM: CT ABDOMEN AND PELVIS WITH CONTRAST TECHNIQUE: Multidetector CT imaging of the abdomen and pelvis was performed using the standard protocol following bolus administration of intravenous contrast. RADIATION DOSE REDUCTION: This exam was performed according to the departmental dose-optimization program which includes automated exposure control, adjustment of the mA and/or  kV according to patient size and/or use of iterative reconstruction technique. CONTRAST:  75mL OMNIPAQUE  IOHEXOL  350 MG/ML SOLN COMPARISON:  CT abdomen and pelvis dated 05/31/2024 and multiple priors FINDINGS: Lower chest: Slightly decreased but persistent bilateral lower lobe atelectasis/consolidation. Interval near-complete resolution of right middle lobe ground-glass opacities. Resolution of pleural effusions. No pneumothorax. Partially imaged heart size is normal. Coronary artery calcifications. Hepatobiliary: No focal hepatic lesions. No intra or extrahepatic biliary ductal dilation. Normal gallbladder. Pancreas: No focal lesions or main ductal dilation. Spleen: Normal in size without focal abnormality. Adrenals/Urinary Tract: No adrenal nodules. No suspicious renal mass, calculi or hydronephrosis. Decreased but persistent mild bladder wall thickening at the dome. Stomach/Bowel: Normal appearance of the stomach. Postsurgical changes of the rectosigmoid colon. Patent ileocolic anastomosis deep to the umbilicus. Right lower quadrant ostomy. Colonic diverticulosis without acute diverticulitis. No abnormal bowel dilation. Interval markedly improved inflammatory change involving the proximal duodenum with decreased but persistent mild mural thickening and peri-duodenal inflammatory change. Similar tethered appearance of small bowel to the right paracolic gutter with thickening of the right pararenal fascia. Previously noted extraluminal contrast material is no longer seen. Foci of gas in this area appear intra-luminal. Vascular/Lymphatic: Aortic atherosclerosis. No enlarged abdominal or pelvic lymph nodes. Reproductive: Prostate is unremarkable. Other: Trace presacral fluid.  No free air. Musculoskeletal: No acute or abnormal lytic or blastic osseous lesions. Small fat-containing bilateral inguinal hernias. Postsurgical changes of the anterior abdominal wall. 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 Small volume bilateral flank edema. IMPRESSION: 1. Interval markedly improved inflammatory change involving the  proximal duodenum with decreased but persistent mild mural thickening and peri-duodenal inflammatory change. Previously noted extraluminal contrast material is no longer seen. 2. Similar tethered appearance of small bowel to the right paracolic gutter with thickening of the right pararenal fascia. Scattered foci of gas in this area appear intraluminal within the tethered bowel loops. 3. Slightly decreased but persistent bilateral lower lobe atelectasis/consolidation. Interval near-complete resolution of right middle lobe ground-glass opacities. Resolution of pleural effusions. 4.  Aortic Atherosclerosis (ICD10-I70.0). Electronically Signed   By: Limin  Xu M.D.   On: 06/05/2024 17:23    Anti-infectives: Anti-infectives (From admission, onward)    Start     Dose/Rate Route Frequency Ordered Stop   05/29/24 1330  linezolid  (ZYVOX ) IVPB 600 mg        600 mg 300 mL/hr over 60 Minutes Intravenous Every 12 hours 05/29/24 1230     05/28/24 2000  piperacillin -tazobactam (ZOSYN ) IVPB 3.375 g        3.375 g 12.5 mL/hr over 240 Minutes Intravenous Every 8 hours 05/28/24 0534     05/28/24 0900  metroNIDAZOLE  (FLAGYL ) IVPB 500 mg        500 mg 100 mL/hr over 60 Minutes Intravenous  Once 05/28/24 0534 05/28/24 1104   05/28/24 0630  DAPTOmycin  (CUBICIN ) IVPB 700 mg/130mL premix        10 mg/kg  68 kg 200 mL/hr over 30 Minutes Intravenous  Once 05/28/24 9372 05/28/24 0754   05/27/24 1945  cefTRIAXone  (ROCEPHIN ) 2 g in sodium chloride  0.9 % 100 mL IVPB        2 g 200 mL/hr over 30 Minutes Intravenous Once 05/27/24 1934 05/27/24 2125   05/27/24 1945  metroNIDAZOLE  (FLAGYL ) IVPB 500 mg        500 mg 100 mL/hr over 60 Minutes Intravenous  Once 05/27/24 1934 05/27/24 2236       Assessment/Plan: 83M with complex medical and surgical history, most recent surgery 04/13/24 with  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 Tanda Regal bowel inflammation with concern for possible perforation - known hostile abdomen - nausea and vomiting, small bowel enteritis and small volume pneumoperitoneum in the RUQ on CT 8/27 - CT 8/28 with concern for inflamed small bowel loop on the right with concern for contained perforation but also possible contrast leak vs contrast within decompressed loop of small  bowel  - SBFT 8/28 without contrast extravasation  - CT 8/31 - same to slightly improved.  - tolerating Soft diet, calorie count, ok to wean off TPN - CT scan 06/05/24 - significant improvement in duodenal inflammation - continue abx - pt remains hemodynamically stable, WBC WNL  - hgb 7.2, medicine has ordered a unit of RBC.      FEN: soft diet, PICC and TPN, ok to wean TPN off; IVF per [facility]; monitor ileostomy output - was high today and has not been previously. Add imodium  TID VTE: heparin  gtt; OK to restart oral anticoagulation ID: Zosyn  8/28>>   - per [facility] -  Hx of PE/DVT Pulmonary HTN PAD HTN GERD - PPI Gout   LOS: 9 days    Alexander [name] 06/06/2024

## 2024-06-06 NOTE — Progress Notes (Signed)
 RN tried to educate patient to use incentive spirometer but he refused to use it.

## 2024-06-06 NOTE — Progress Notes (Signed)
 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 Usual Weight: 68.2 Kg  Assessment:  63 yo M with complex surgical history including 12/2023 developed colonic perforation I/s/o diverticulitis requiring emergent ex lap initially left in discontinuity and required additional subsequent SBR and ileostomy, complicated postoperative course including cardiac arrest, shock, renal failure requiring CRRT, intrabdominal abscess requiring drainage including VRE intraabdominal infection. Most recent surgery 04/13/24 with ileocolic anastomosis and diverting loop ileostomy for fascial dehiscence. Admitted 8/27 with NVD and concern for perforated small bowel. Pharmacy consulted for TPN.  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 Tanda - return 9/27 for further eval of abd wound dehiscence  Central access: PICC placed 05/29/2024 TPN start date: 05/29/2024  Nutritional Goals: Goal TPN rate is 85 mL/hr (52 g/L AA, 15% CHO, 32 g/L ILE to provide 107g AA and 2117 kcals per day)  RD Estimated Needs Total Energy Estimated Needs: 2100-2300 Total Protein Estimated Needs:  105-125 grams Total Fluid Estimated Needs: >/= 2 L  Current Nutrition:  TPN Soft diet since 9/2 - eating well per pt; could not quantity amount and was irritable  [employer] Plus BID - 2 charted on 9/4 9/4 calorie count: inadequate intake Changed to regular diet   Plan:  Increase TPN to 75% per RD - TPN to 65 ml/hr starting at 1800 Electrolytes in TPN: Na 105 mEq/L, K 45 mEq/L, Ca 1.5 mEq/L, Mag 10 mEq/L, Phos 14 mmol/L, Cl:Ac 1:1 - lytes adjusted with adjusted TPN rate. Continue standard MVI and trace elements to TPN Monitor TPN labs on Mon/Thurs - labs in AM  Sharyne Glatter, PharmD, BCCCP Critical Care Clinical Pharmacist 06/06/2024 7:31 AM

## 2024-06-06 NOTE — Plan of Care (Signed)
  Problem: Clinical Measurements: Goal: Will remain free from infection Outcome: Progressing Goal: Diagnostic test results will improve Outcome: Progressing   Problem: Activity: Goal: Risk for activity intolerance will decrease Outcome: Progressing   Problem: Nutrition: Goal: Adequate nutrition will be maintained Outcome: Progressing

## 2024-06-06 NOTE — Progress Notes (Signed)
 PROGRESS NOTE                                                                                                                                                                                                             Patient Demographics:    Alexander [name], is a 63 y.o. male, DOB - Aug 31, 1961, FMW:[id number]  Outpatient Primary MD for the patient is [name], [name]    LOS - 9  Admit date - 05/27/2024    Chief Complaint  Patient presents with   Abdominal Pain   Nausea   Weakness   Emesis       Brief Narrative (HPI from H&P)     63 y.o. male with complex hx in 4/'25 developed colonic perforation I/s/o diverticulitis requiring emergent ex lap initially left in discontinuity and required additional subsequent SBR and ileostomy, complicated postoperative course including cardiac arrest, shock, renal failure requiring CRRT, intrabdominal abscess requiring drainage including VRE intraabdominal infection (S to Daptomycin ), ultimately discharged to [facility], and readmitted in 7/'25 with wound dehiscence and underwent ex lap 7/14 with extensive LOA, sigmoid colectomy and ileocolonic anastamosis and creation of a loop ileostomy, and 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 Presented with acute onset of N/V, abd pain, imaging was suggestive of some free air in the abdomen which probably contained perforation, seen by general surgery and admitted to the hospital.   Subjective:   Tolerating soft diet, reports itching   Assessment  & Plan :   Sepsis, nausea vomiting, secondary to possible contained intra-abdominal perforation.  Patient with history of multiple abdominal surgeries and recent intra-abdominal abscess, has ileostomy.     -He has been seen by general surgery, he has complicated history of multiple abdominal  surgeries, recent intra-abdominal abscess, ileostomy.  There is suspicion of contained perforation, he has been treated conservatively with bowel rest, IV fluids, TNA, pain control and antibiotics.  Will defer management of this issue to general surgery who is following the patient.  - Tolerating soft diet, remains on TPN, can start weaning TPN per general surgery -CT scan obtained 9/5 showing improvement, continue with current antibiotics per general surgery recommendations. - Per general surgery can resume oral anticoagulation, please see discussion below. - Started on scheduled Imodium  per  general surgery for increased ileostomy output.  Prolonged Qtc  Avoid QT prolonging medications, replace electrolytes, monitor on telemetry.   Incidental rhinovirus URI.  Asymptomatic.  Monitor.    Hypomagnesemia and hypokalemia.  Replaced.    Chronic medical problems: Reintroduce oral medications as he is more consistent with oral diet.  Complex Surgical Hx per above, noted   DVT/PE: Hold Warfarin in case requires surgery. INR < 2 at present. Trend daily. Continue  Heparin  drip.  Monitor CBC closely.  Clinically improved, per general surgery can be started on oral anticoagulation, he has been on warfarin and due to cost issues, discussed with pharmacy to see if cost is not prohibitive then we can either start Eliquis  or Xarelto , otherwise can resume warfarin  Diastolic HF: Without exacerbation, dry clinically.   HTN: not on antiHTN at time of admission   Anemia: - Received 1 unit PRBC yesterday  Chronic pain: Home regimen is Dilaudid  2 mg PO q 4 hr prn, see acute pain management above. Holding home Gabapentin .   ?insomnia/mood: holding trazodone    ?? ASCVD: Hold aspirin ; unclear indication since on Warfarin per above      Condition - Extremely Guarded  Family Communication  : None present  Code Status : Full code  Consults  : General surgery  PUD Prophylaxis : PPI   Procedures  :      Small bowel follow-through 05/28/2024.  No extravasation of contrast.    CT 05/28/2024- 1. Findings which remain concerning for the presence of contained free air within the mid right abdomen and right upper quadrant, as described above. 2. Markedly inflamed, partially opacified small bowel loops within the mid and upper right abdomen. 3. Curvilinear area of oral contrast within the lateral aspect of the mid to lower right abdomen which may be within a decompressed small bowel loop, however, intraluminal location cannot completely be confirmed. 4. Stable moderate to marked severity areas of atelectasis and/or infiltrate within the bilateral lower lobes. 5. Colonic diverticulosis. 6. Aortic atherosclerosis.      Disposition Plan  :    Status is: Inpatient  DVT Prophylaxis  :  Hep gtt  SCDs Start: 05/28/24 0530    Lab Results  Component Value Date   PLT 370 06/06/2024    Diet :  Diet Order             Diet regular Fluid consistency: Thin  Diet effective now                    Inpatient Medications  Scheduled Meds:  (feeding supplement) PROSource Plus  30 mL Oral BID AC & HS   Chlorhexidine  Gluconate Cloth  6 each Topical Daily   ferrous sulfate   325 mg Oral Q breakfast   gabapentin   200 mg Oral TID   loperamide   2 mg Oral TID WC   magic mouthwash  5 mL Oral QID   pantoprazole  (PROTONIX ) IV  40 mg Intravenous Q12H   polycarbophil  625 mg Oral Daily   sodium chloride  flush  10-40 mL Intracatheter Q12H   sodium chloride  flush  3 mL Intravenous Q12H   traZODone   100 mg Oral QHS   Continuous Infusions:  heparin  2,200 Units/hr (06/06/24 0917)   linezolid  (ZYVOX ) IV 600 mg (06/06/24 0930)   piperacillin -tazobactam (ZOSYN )  IV 3.375 g (06/06/24 1201)   TPN ADULT (ION) 45 mL/hr at 06/06/24 0700   TPN ADULT (ION)     PRN Meds:.acetaminophen , albuterol , diphenhydrAMINE , morphine  injection **OR**  morphine  injection, sodium chloride  flush, trimethobenzamide     Objective:    Vitals:   06/06/24 0306 06/06/24 0500 06/06/24 0917 06/06/24 1120  BP: (!) 104/57  (!) 138/43   Pulse: 75  78   Resp:   15   Temp: 98.4 F (36.9 C)  98.2 F (36.8 C) 98 F (36.7 C)  TempSrc: Oral  Oral Oral  SpO2: 95%  95%   Weight:  67.7 kg    Height:        Wt Readings from Last 3 Encounters:  06/06/24 67.7 kg  04/29/24 55 kg  04/06/24 71 kg     Intake/Output Summary (Last 24 hours) at 06/06/2024 1411 Last data filed at 06/06/2024 1300 Gross per 24 hour  Intake 2082.18 ml  Output 3292 ml  Net -1209.82 ml     Physical Exam  Awake Alert, Oriented X 3, No new F.N deficits, Normal affect Symmetrical Chest wall movement, decreased air entry at the bases RRR,No Gallops,Rubs or new Murmurs, No Parasternal Heave +ve B.Sounds, Abd Soft, ileostomy present, abdominal wound bandaged No edema         Data Review:    Recent Labs  Lab 05/31/24 0156 06/01/24 0252 06/02/24 0332 06/03/24 0331 06/04/24 0927 06/05/24 0241 06/05/24 1833 06/06/24 0017  WBC 5.5 5.6 6.1 6.1 6.1 6.5  --  8.4  HGB 7.6* 8.1* 7.8* 7.6* 7.5* 7.2* 8.8* 8.8*  HCT 24.1* 25.6* 25.3* 24.6* 24.1* 24.0* 28.4* 28.6*  PLT 268 293 292 308 371 380  --  370  MCV 94.5 95.2 96.6 96.5 98.0 100.4*  --  96.9  MCH 29.8 30.1 29.8 29.8 30.5 30.1  --  29.8  MCHC 31.5 31.6 30.8 30.9 31.1 30.0  --  30.8  RDW 15.9* 16.0* 16.4* 16.6* 17.2* 17.2*  --  18.7*  LYMPHSABS 0.9 1.2 0.8 1.4  --   --   --   --   MONOABS 0.5 0.6 0.4 0.5  --   --   --   --   EOSABS 0.1 0.1 0.1 0.0  --   --   --   --   BASOSABS 0.0 0.0 0.1 0.0  --   --   --   --     Recent Labs  Lab 05/31/24 0156 06/01/24 0252 06/02/24 0332 06/03/24 0331 06/04/24 0038 06/05/24 0241 06/06/24 0017  NA 134* 134* 138 139 138 138 135  K 3.7 4.4 4.1 4.3 4.5 4.5 4.3  CL 102 101 102 104 105 103 100  CO2 24 28 26 25 23 25 25   ANIONGAP 8 5 10 10 10 10 10   GLUCOSE 121* 116* 128* 119* 131* 120* 109*  BUN 7* <5* 8 8 10 12 12   CREATININE 0.54* 0.53* 0.62 0.70  0.80 0.81 0.82  AST 12* 12* 12* 14* 18  --   --   ALT 9 10 9 11 13   --   --   ALKPHOS 48 49 44 48 47  --   --   BILITOT 0.2 0.2 0.3 0.3 0.3  --   --   ALBUMIN  1.9* 2.1* 2.3* 2.3* 2.4*  --   --   CRP 10.7* 7.4* 4.2*  --   --   --   --   PROCALCITON 1.08 0.53 0.29 0.15  --   --   --   INR 1.1 1.0  --   --   --   --   --   MG 1.7 1.8 1.9  --  1.8 1.8 2.3  PHOS 3.6 5.2* 4.5  --  3.4  --  5.1*  CALCIUM  8.4* 8.7* 8.7* 8.9 9.0 9.1 9.1      Recent Labs  Lab 05/31/24 0156 06/01/24 0252 06/02/24 0332 06/03/24 0331 06/04/24 0038 06/05/24 0241 06/06/24 0017  CRP 10.7* 7.4* 4.2*  --   --   --   --   PROCALCITON 1.08 0.53 0.29 0.15  --   --   --   INR 1.1 1.0  --   --   --   --   --   MG 1.7 1.8 1.9  --  1.8 1.8 2.3  CALCIUM  8.4* 8.7* 8.7* 8.9 9.0 9.1 9.1    --------------------------------------------------------------------------------------------------------------- Lab Results  Component Value Date   CHOL 114 12/03/2023   HDL 43.40 12/03/2023   LDLCALC 58 12/03/2023   TRIG 45 06/01/2024   CHOLHDL 3 12/03/2023    Lab Results  Component Value Date   HGBA1C 5.9 12/03/2023   No results for input(s): TSH, T4TOTAL, FREET4, T3FREE, THYROIDAB in the last 72 hours. No results for input(s): VITAMINB12, FOLATE, FERRITIN, TIBC, IRON , RETICCTPCT in the last 72 hours. ------------------------------------------------------------------------------------------------------------------ Cardiac Enzymes No results for input(s): CKMB, TROPONINI, MYOGLOBIN in the last 168 hours.  Invalid input(s): CK  Micro Results Recent Results (from the past 240 hours)  Resp panel by RT-PCR (RSV, Flu A&B, Covid) Anterior Nasal Swab     Status: None   Collection Time: 05/27/24  7:49 PM   Specimen: Anterior Nasal Swab  Result Value Ref Range Status   SARS Coronavirus 2 by RT PCR NEGATIVE NEGATIVE Final   Influenza A by PCR NEGATIVE NEGATIVE Final   Influenza B by PCR NEGATIVE  NEGATIVE Final    Comment: (NOTE) The Xpert Xpress SARS-CoV-2/FLU/RSV plus assay is intended as an aid in the diagnosis of influenza from Nasopharyngeal swab specimens and should not be used as a sole basis for treatment. Nasal washings and aspirates are unacceptable for Xpert Xpress SARS-CoV-2/FLU/RSV testing.  Fact Sheet for Patients: BloggerCourse.com  Fact Sheet for Healthcare Providers: SeriousBroker.it  This test is not yet approved or cleared by the [place]  FDA and has been authorized for detection and/or diagnosis of SARS-CoV-2 by FDA under an Emergency Use Authorization (EUA). This EUA will remain in effect (meaning this test can be used) for the duration of the COVID-19 declaration under Section 564(b)(1) of the Act, 21 U.S.C. section 360bbb-3(b)(1), unless the authorization is terminated or revoked.     Resp Syncytial Virus by PCR NEGATIVE NEGATIVE Final    Comment: (NOTE) Fact Sheet for Patients: BloggerCourse.com  Fact Sheet for Healthcare Providers: SeriousBroker.it  This test is not yet approved or cleared by the [place]  FDA and has been authorized for detection and/or diagnosis of SARS-CoV-2 by FDA under an Emergency Use Authorization (EUA). This EUA will remain in effect (meaning this test can be used) for the duration of the COVID-19 declaration under Section 564(b)(1) of the Act, 21 U.S.C. section 360bbb-3(b)(1), unless the authorization is terminated or revoked.  Performed at [facility], [address] [place], [place] [address]   Blood Culture (routine x 2)     Status: None   Collection Time: 05/27/24  8:00 PM   Specimen: BLOOD LEFT ARM  Result Value Ref Range Status   Specimen Description BLOOD LEFT ARM  Final   Special Requests   Final    BOTTLES DRAWN AEROBIC AND ANAEROBIC Blood Culture adequate volume   Culture  Final    NO  GROWTH 5 DAYS Performed at [facility], [address] [place], [place] [address]    Report Status 06/01/2024 FINAL  Final  Blood Culture (routine x 2)     Status: None   Collection Time: 05/27/24  8:44 PM   Specimen: BLOOD RIGHT ARM  Result Value Ref Range Status   Specimen Description BLOOD RIGHT ARM  Final   Special Requests   Final    BOTTLES DRAWN AEROBIC AND ANAEROBIC Blood Culture adequate volume   Culture   Final    NO GROWTH 5 DAYS Performed at [facility], [address], [place], [place] [address]    Report Status 06/01/2024 FINAL  Final  Respiratory (~20 pathogens) panel by PCR     Status: Abnormal   Collection Time: 05/28/24  8:58 AM   Specimen: Nasopharyngeal Swab; Respiratory  Result Value Ref Range Status   Adenovirus NOT DETECTED NOT DETECTED Final   Coronavirus 229E NOT DETECTED NOT DETECTED Final    Comment: (NOTE) The Coronavirus on the Respiratory Panel, DOES NOT test for the novel  Coronavirus (2019 nCoV)    Coronavirus HKU1 NOT DETECTED NOT DETECTED Final   Coronavirus NL63 NOT DETECTED NOT DETECTED Final   Coronavirus OC43 NOT DETECTED NOT DETECTED Final   Metapneumovirus NOT DETECTED NOT DETECTED Final   Rhinovirus / Enterovirus DETECTED (A) NOT DETECTED Final   Influenza A NOT DETECTED NOT DETECTED Final   Influenza B NOT DETECTED NOT DETECTED Final   Parainfluenza Virus 1 NOT DETECTED NOT DETECTED Final   Parainfluenza Virus 2 NOT DETECTED NOT DETECTED Final   Parainfluenza Virus 3 NOT DETECTED NOT DETECTED Final   Parainfluenza Virus 4 NOT DETECTED NOT DETECTED Final   Respiratory Syncytial Virus NOT DETECTED NOT DETECTED Final   Bordetella pertussis NOT DETECTED NOT DETECTED Final   Bordetella Parapertussis NOT DETECTED NOT DETECTED Final   Chlamydophila pneumoniae NOT DETECTED NOT DETECTED Final   Mycoplasma pneumoniae NOT DETECTED NOT DETECTED Final    Comment: Performed at [facility], 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 EXAM: CT ABDOMEN AND PELVIS WITH CONTRAST TECHNIQUE: Multidetector CT imaging of the abdomen and pelvis was performed using the standard protocol following bolus administration of intravenous contrast. RADIATION DOSE REDUCTION: This exam was performed according to the departmental dose-optimization program which includes automated exposure control, adjustment of the mA and/or kV according to patient size and/or use of iterative reconstruction technique. CONTRAST:  75mL OMNIPAQUE  IOHEXOL  350 MG/ML SOLN COMPARISON:  CT abdomen and pelvis dated 05/31/2024 and multiple priors FINDINGS: Lower chest: Slightly decreased but persistent bilateral lower lobe atelectasis/consolidation. Interval near-complete resolution of right middle lobe ground-glass opacities. Resolution of pleural effusions. No pneumothorax. Partially imaged heart size is normal. Coronary artery calcifications. Hepatobiliary: No focal hepatic lesions. No intra or extrahepatic biliary ductal dilation. Normal gallbladder. Pancreas: No focal lesions or main ductal dilation. Spleen: Normal in size without focal abnormality. Adrenals/Urinary Tract: No adrenal nodules. No suspicious renal mass, calculi or hydronephrosis. Decreased but persistent mild bladder wall thickening at the dome. Stomach/Bowel: Normal appearance of the stomach. Postsurgical changes of the rectosigmoid colon. Patent ileocolic anastomosis deep to the umbilicus. Right lower quadrant ostomy. Colonic diverticulosis without  acute diverticulitis. No abnormal bowel dilation. Interval markedly improved inflammatory change involving the proximal duodenum with decreased but persistent mild mural thickening and  peri-duodenal inflammatory change. Similar tethered appearance of small bowel to the right paracolic gutter with thickening of the right pararenal fascia. Previously noted extraluminal contrast material is no longer seen. Foci of gas in this area appear intra-luminal. Vascular/Lymphatic: Aortic atherosclerosis. No enlarged abdominal or pelvic lymph nodes. Reproductive: Prostate is unremarkable. Other: Trace presacral fluid.  No free air. Musculoskeletal: No acute or abnormal lytic or blastic osseous lesions. Small fat-containing bilateral inguinal hernias. Postsurgical changes of the anterior abdominal wall. 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 Small volume bilateral flank edema. IMPRESSION: 1. Interval markedly improved inflammatory change involving the proximal duodenum with decreased but persistent mild mural thickening and peri-duodenal inflammatory change. Previously noted extraluminal contrast material is no longer seen. 2. Similar tethered appearance of small bowel to the right paracolic gutter with thickening of the right pararenal fascia. Scattered foci of gas in this area appear intraluminal within the tethered bowel loops. 3. Slightly decreased but persistent bilateral lower lobe atelectasis/consolidation. Interval near-complete resolution of right middle lobe ground-glass opacities. Resolution of pleural effusions. 4.  Aortic Atherosclerosis (ICD10-I70.0). Electronically Signed   By: Limin  Xu M.D.   On: 06/05/2024 17:23      Signature  -   Brayton Lye M.D on 06/06/2024 at 2:11 PM   -  To page go to [email or web address]

## 2024-06-06 NOTE — Progress Notes (Signed)
 PHARMACY - ANTICOAGULATION CONSULT NOTE  Pharmacy Consult:  Heparin  Indication: VTE treatment (warfarin PTA on hold)  No Known Allergies  Patient Measurements: Height: 5' 9 (175.3 cm) Weight: 67.7 kg (149 lb 4 oz) IBW/kg (Calculated) : 70.7 HEPARIN  DW (KG): 68  Vital Signs: Temp: 98.4 F (36.9 C) (09/06 0306) Temp Source: Oral (09/06 0306) BP: 104/57 (09/06 0306) Pulse Rate: 75 (09/06 0306)  Labs: Recent Labs    06/04/24 0038 06/04/24 0038 06/04/24 9072 06/05/24 0241 06/05/24 1833 06/06/24 0017 06/06/24 0800  HGB  --    < > 7.5* 7.2* 8.8* 8.8*  --   HCT  --    < > 24.1* 24.0* 28.4* 28.6*  --   PLT  --   --  371 380  --  370  --   HEPARINUNFRC 0.48  --   --  0.61  --  0.45 0.41  CREATININE 0.80  --   --  0.81  --  0.82  --    < > = values in this interval not displayed.    Estimated Creatinine Clearance: 89.4 mL/min (by C-G formula based on SCr of 0.82 mg/dL).  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 Using heparin  as a bridge until taking po medications consistently.  Heparin  level 0.41 and within therapeutic range. Hgb stable and PLT WNL. No issues with infusion of signs of bleeding per RN.   Goal of Therapy:  Heparin  level 0.3-0.7 units/ml Monitor platelets by anticoagulation protocol: Yes   Plan:  Continue heparin  infusion at 2200 units/hr  Check heparin  level daily while on heparin   Continue to monitor H&H and platelets   Thank you for allowing pharmacy to be a part of this patient's care    Prentice DOROTHA Favors, PharmD PGY1 Health-System Pharmacy Administration and Leadership Resident [facility]  06/06/2024 10:25 AM

## 2024-06-06 NOTE — TOC Progression Note (Signed)
 Transition of Care [facility]) - Progression Note    Patient Details  Name: Alexander [name] MRN: [id number] Date of Birth: 01-21-1961  Transition of [facility] CM/SW Contact  Isaiah Public, LCSWA Phone Number: 06/06/2024, 12:27 PM  Clinical Narrative:     CSW continues to follow. CSW following to start insurance authorization closer to patient being medically ready for dc.  Expected Discharge Plan: Skilled Nursing Facility Barriers to Discharge: Continued Medical Work up, English as a second language teacher               Expected Discharge Plan and Services In-house Referral: Clinical Social Work   Post Acute Care Choice: Skilled Nursing Facility Living arrangements for the past 2 months: Single Family Home, Skilled Nursing Facility                                       Social Drivers of Health (SDOH) Interventions SDOH Screenings   Food Insecurity: Patient Unable To Answer (05/28/2024)  Housing: Unknown (05/28/2024)  Recent Concern: Housing - High Risk (04/10/2024)  Transportation Needs: Patient Unable To Answer (05/28/2024)  Recent Concern: Transportation Needs - Unmet Transportation Needs (03/30/2024)  Utilities: Not At Risk (05/28/2024)  Depression (PHQ2-9): Low Risk  (07/24/2023)  Financial Resource Strain: Low Risk  (05/01/2024)   Received from [facility]  Social Connections: Moderately Isolated (05/01/2024)   Received from [facility]  Stress: No Stress Concern Present (05/13/2024)   Received from [facility]  Tobacco Use: Medium Risk (05/27/2024)    Readmission Risk Interventions    05/28/2024    2:04 PM 04/30/2024   12:54 PM 04/10/2024    1:55 PM  Readmission Risk Prevention Plan  Transportation Screening Complete Complete Complete  Medication Review Oceanographer) Complete Complete Complete  PCP or Specialist appointment within 3-5 days of discharge Complete Complete Complete  HRI or Home Care Consult Complete Complete Complete  SW Recovery Care/Counseling  Consult Complete Complete Complete  Palliative Care Screening Not Applicable Not Applicable Not Applicable  Skilled Nursing Facility Complete Not Applicable Not Applicable

## 2024-06-07 DIAGNOSIS — K668 Other specified disorders of peritoneum: Secondary | ICD-10-CM | POA: Diagnosis not present

## 2024-06-07 LAB — CBC
HCT: 28.2 % — ABNORMAL LOW (ref 39.0–52.0)
Hemoglobin: 8.8 g/dL — ABNORMAL LOW (ref 13.0–17.0)
MCH: 29.8 pg (ref 26.0–34.0)
MCHC: 31.2 g/dL (ref 30.0–36.0)
MCV: 95.6 fL (ref 80.0–100.0)
Platelets: 363 K/uL (ref 150–400)
RBC: 2.95 MIL/uL — ABNORMAL LOW (ref 4.22–5.81)
RDW: 17.8 % — ABNORMAL HIGH (ref 11.5–15.5)
WBC: 6.2 K/uL (ref 4.0–10.5)
nRBC: 0 % (ref 0.0–0.2)

## 2024-06-07 LAB — BASIC METABOLIC PANEL WITH GFR
Anion gap: 11 (ref 5–15)
BUN: 12 mg/dL (ref 8–23)
CO2: 26 mmol/L (ref 22–32)
Calcium: 9.4 mg/dL (ref 8.9–10.3)
Chloride: 99 mmol/L (ref 98–111)
Creatinine, Ser: 0.81 mg/dL (ref 0.61–1.24)
GFR, Estimated: >60 mL/min (ref >60)
Glucose, Bld: 104 mg/dL — ABNORMAL HIGH (ref 70–99)
Potassium: 4.5 mmol/L (ref 3.5–5.1)
Sodium: 136 mmol/L (ref 135–145)

## 2024-06-07 LAB — PHOSPHORUS: Phosphorus: 6.3 mg/dL — ABNORMAL HIGH (ref 2.5–4.6)

## 2024-06-07 LAB — HEPARIN LEVEL (UNFRACTIONATED): Heparin Unfractionated: >1.1 [IU]/mL — ABNORMAL HIGH (ref 0.30–0.70)

## 2024-06-07 MED ORDER — LOPERAMIDE HCL 2 MG PO CAPS
4.0000 mg | ORAL_CAPSULE | Freq: Three times a day (TID) | ORAL | Status: DC
  Administered 2024-06-07 – 2024-06-08 (×3): 4 mg via ORAL
  Filled 2024-06-07 (×3): qty 2

## 2024-06-07 MED ORDER — TRAVASOL 10 % IV SOLN
INTRAVENOUS | Status: AC
  Filled 2024-06-07: qty 499.2

## 2024-06-07 NOTE — Progress Notes (Signed)
 Subjective/Chief Complaint: Pharmacy is weaning TPN Ileostomy output decreased to 1475 cc yesterday after adding Imodium  Still complaining of some abdominal cramping   Objective: Vital signs in last 24 hours: Temp:  [98 F (36.7 C)-98.6 F (37 C)] 98.2 F (36.8 C) (09/07 0832) Pulse Rate:  [78-85] 79 (09/07 0832) Resp:  [15-18] 18 (09/07 0832) BP: (110-147)/(43-74) 110/53 (09/07 0832) SpO2:  [93 %-98 %] 93 % (09/07 0832) Last BM Date : 06/06/24  Intake/Output from previous day: 09/06 0701 - 09/07 0700 In: 938.2 [I.V.:566.8; IV Piggyback:371.4] Out: 3275 [Urine:1800; Stool:1475] Intake/Output this shift: No intake/output data recorded.  WDWN in NAD Abd - soft, minimal tenderness; midline incision with good granulation tissue Ileostomy - very thin liquid stool  Lab Results:  Recent Labs    06/06/24 0017 06/07/24 0422  WBC 8.4 6.2  HGB 8.8* 8.8*  HCT 28.6* 28.2*  PLT 370 363   BMET Recent Labs    06/06/24 0017 06/07/24 0422  NA 135 136  K 4.3 4.5  CL 100 99  CO2 25 26  GLUCOSE 109* 104*  BUN 12 12  CREATININE 0.82 0.81  CALCIUM  9.1 9.4   PT/INR No results for input(s): LABPROT, INR in the last 72 hours. ABG No results for input(s): PHART, HCO3 in the last 72 hours.  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 EXAM: CT ABDOMEN AND PELVIS WITH CONTRAST TECHNIQUE: Multidetector CT imaging of the abdomen and pelvis was performed using the standard protocol following bolus administration of intravenous contrast. RADIATION DOSE REDUCTION: This exam was performed according to the departmental dose-optimization program which includes automated exposure control, adjustment of the mA and/or kV  according to patient size and/or use of iterative reconstruction technique. CONTRAST:  75mL OMNIPAQUE  IOHEXOL  350 MG/ML SOLN COMPARISON:  CT abdomen and pelvis dated 05/31/2024 and multiple priors FINDINGS: Lower chest: Slightly decreased but persistent bilateral lower lobe atelectasis/consolidation. Interval near-complete resolution of right middle lobe ground-glass opacities. Resolution of pleural effusions. No pneumothorax. Partially imaged heart size is normal. Coronary artery calcifications. Hepatobiliary: No focal hepatic lesions. No intra or extrahepatic biliary ductal dilation. Normal gallbladder. Pancreas: No focal lesions or main ductal dilation. Spleen: Normal in size without focal abnormality. Adrenals/Urinary Tract: No adrenal nodules. No suspicious renal mass, calculi or hydronephrosis. Decreased but persistent mild bladder wall thickening at the dome. Stomach/Bowel: Normal appearance of the stomach. Postsurgical changes of the rectosigmoid colon. Patent ileocolic anastomosis deep to the umbilicus. Right lower quadrant ostomy. Colonic diverticulosis without acute diverticulitis. No abnormal bowel dilation. Interval markedly improved inflammatory change involving the proximal duodenum with decreased but persistent mild mural thickening and peri-duodenal inflammatory change. Similar tethered appearance of small bowel to the right paracolic gutter with thickening of the right pararenal fascia. Previously noted extraluminal contrast material is no longer seen. Foci of gas in this area appear intra-luminal. Vascular/Lymphatic: Aortic atherosclerosis. No enlarged abdominal or pelvic lymph nodes. Reproductive: Prostate is unremarkable. Other: Trace presacral fluid.  No free air. Musculoskeletal: No acute or abnormal lytic or blastic osseous lesions. Small fat-containing bilateral inguinal hernias. Postsurgical changes of the anterior abdominal wall. Intramuscular lipoma measuring 2.6 x 1.8 cm in the proximal  left thigh. Small volume bilateral flank edema. IMPRESSION: 1. Interval markedly improved inflammatory change involving the proximal duodenum with decreased but  persistent mild mural thickening and peri-duodenal inflammatory change. Previously noted extraluminal contrast material is no longer seen. 2. Similar tethered appearance of small bowel to the right paracolic gutter with thickening of the right pararenal fascia. Scattered foci of gas in this area appear intraluminal within the tethered bowel loops. 3. Slightly decreased but persistent bilateral lower lobe atelectasis/consolidation. Interval near-complete resolution of right middle lobe ground-glass opacities. Resolution of pleural effusions. 4.  Aortic Atherosclerosis (ICD10-I70.0). Electronically Signed   By: Limin  Xu M.D.   On: 06/05/2024 17:23    Anti-infectives: Anti-infectives (From admission, onward)    Start     Dose/Rate Route Frequency Ordered Stop   05/29/24 1330  linezolid  (ZYVOX ) IVPB 600 mg        600 mg 300 mL/hr over 60 Minutes Intravenous Every 12 hours 05/29/24 1230     05/28/24 2000  piperacillin -tazobactam (ZOSYN ) IVPB 3.375 g        3.375 g 12.5 mL/hr over 240 Minutes Intravenous Every 8 hours 05/28/24 0534     05/28/24 0900  metroNIDAZOLE  (FLAGYL ) IVPB 500 mg        500 mg 100 mL/hr over 60 Minutes Intravenous  Once 05/28/24 0534 05/28/24 1104   05/28/24 0630  DAPTOmycin  (CUBICIN ) IVPB 700 mg/159mL premix        10 mg/kg  68 kg 200 mL/hr over 30 Minutes Intravenous  Once 05/28/24 9372 05/28/24 0754   05/27/24 1945  cefTRIAXone  (ROCEPHIN ) 2 g in sodium chloride  0.9 % 100 mL IVPB        2 g 200 mL/hr over 30 Minutes Intravenous Once 05/27/24 1934 05/27/24 2125   05/27/24 1945  metroNIDAZOLE  (FLAGYL ) IVPB 500 mg        500 mg 100 mL/hr over 60 Minutes Intravenous  Once 05/27/24 1934 05/27/24 2236       Assessment/Plan: 67M with complex medical and surgical history, most recent surgery 04/13/24 with ileocolic  anastomosis and diverting loop ileostomy for fascial dehiscence by Dr. Tanda Regal bowel inflammation with concern for possible perforation - known hostile abdomen - nausea and vomiting, small bowel enteritis and small volume pneumoperitoneum in the RUQ on CT 8/27 - CT 8/28 with concern for inflamed small bowel loop on the right with concern for contained perforation but also possible contrast leak vs contrast within decompressed loop of small  bowel  - SBFT 8/28 without contrast extravasation  - CT 8/31 - same to slightly improved.  - tolerating Soft diet, calorie count, ok to wean off TPN - CT scan 06/05/24 - significant improvement in duodenal inflammation - continue abx - pt remains hemodynamically stable, WBC WNL  - hgb 8.8     FEN: soft diet, PICC and TPN, ok to wean TPN off; IVF per TR[facility]; monitor ileostomy output - was high today and has not been previously. Add imodium  4 mg TID VTE: heparin  gtt; OK to restart oral anticoagulation ID: Zosyn  8/28>>   - per TR[facility] -  Hx of PE/DVT Pulmonary HTN PAD HTN GERD - PPI Gout   LOS: 10 days    Donnice MARLA Lima 06/07/2024

## 2024-06-07 NOTE — Plan of Care (Signed)
  Problem: Education: Goal: Knowledge of General Education information will improve Description: Including pain rating scale, medication(s)/side effects and non-pharmacologic comfort measures Outcome: Progressing   Problem: Health Behavior/Discharge Planning: Goal: Ability to manage health-related needs will improve Outcome: Progressing   Problem: Clinical Measurements: Goal: Ability to maintain clinical measurements within normal limits will improve Outcome: Progressing Goal: Will remain free from infection Outcome: Progressing Goal: Diagnostic test results will improve Outcome: Progressing Goal: Respiratory complications will improve Outcome: Progressing Goal: Cardiovascular complication will be avoided Outcome: Progressing   Problem: Activity: Goal: Risk for activity intolerance will decrease Outcome: Progressing   Problem: Nutrition: Goal: Adequate nutrition will be maintained Outcome: Progressing   Problem: Coping: Goal: Level of anxiety will decrease Outcome: Progressing   Problem: Elimination: Goal: Will not experience complications related to urinary retention Outcome: Progressing   Problem: Safety: Goal: Ability to remain free from injury will improve Outcome: Progressing   Problem: Skin Integrity: Goal: Risk for impaired skin integrity will decrease Outcome: Progressing   

## 2024-06-07 NOTE — Progress Notes (Signed)
 PROGRESS NOTE                                                                                                                                                                                                             Patient Demographics:    Alexander [name], is a 63 y.o. male, DOB - [birth year], FMW:[id number]  Outpatient Primary MD for the patient is [name], [name]    LOS - 10  Admit date - 05/27/2024    Chief Complaint  Patient presents with   Abdominal Pain   Nausea   Weakness   Emesis       Brief Narrative (HPI from H&P)     63 y.o. male with complex hx in 4/'25 developed colonic perforation I/s/o diverticulitis requiring emergent ex lap initially left in discontinuity and required additional subsequent SBR and ileostomy, complicated postoperative course including cardiac arrest, shock, renal failure requiring CRRT, intrabdominal abscess requiring drainage including VRE intraabdominal infection (S to Daptomycin ), ultimately discharged to [facility], and readmitted in 7/'25 with wound dehiscence and underwent ex lap 7/14 with extensive LOA, sigmoid colectomy and ileocolonic anastamosis and creation of a loop ileostomy, and 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 Presented with acute onset of N/V, abd pain, imaging was suggestive of some free air in the abdomen which probably contained perforation, seen by general surgery and admitted to the hospital.   Subjective:   Reports abdominal cramping   Assessment  & Plan :   Sepsis, nausea vomiting, secondary to possible contained intra-abdominal perforation.  Patient with history of multiple abdominal surgeries and recent intra-abdominal abscess, has ileostomy.     -He has been seen by general surgery, he has complicated history of multiple abdominal  surgeries, recent intra-abdominal abscess, ileostomy.  There is suspicion of contained perforation, he has been treated conservatively with bowel rest, IV fluids, TNA, pain control and antibiotics.  Will defer management of this issue to general surgery who is following the patient.  - Tolerating soft diet, remains on TPN, can start weaning TPN per general surgery -CT scan obtained 9/5 showing improvement, continue with current antibiotics per general surgery recommendations. - Per general surgery can resume oral anticoagulation, please see discussion below. - Started on scheduled Imodium  per general surgery  for increased ileostomy output.  Prolonged Qtc  Avoid QT prolonging medications, replace electrolytes, monitor on telemetry.   Incidental rhinovirus URI.  Asymptomatic.  Monitor.    Hypomagnesemia and hypokalemia.  Replaced.    Chronic medical problems: Reintroduce oral medications as he is more consistent with oral diet.  Complex Surgical Hx per above, noted   DVT/PE:  - He is on warfarin at home due to cost  - On heparin  gtt. on admission once INR less than 2, for possible need for surgery.   - No intervention planned by general surgery, cleared to start anticoagulation, pharmacy has checked benefits and co-pay for Eliquis  or Xarelto  0, so patient is started on Xarelto .    Diastolic HF: Without exacerbation, dry clinically.   HTN: not on antiHTN at time of admission   Anemia: - Received 1 unit PRBC 9/5, hemoglobin stable  Chronic pain: Home regimen is Dilaudid  2 mg PO q 4 hr prn, see acute pain management above. Holding home Gabapentin .   ?insomnia/mood: holding trazodone    ?? ASCVD: Hold aspirin ; unclear indication since on Warfarin per above      Condition - Extremely Guarded  Family Communication  : None present  Code Status : Full code  Consults  : General surgery  PUD Prophylaxis : PPI   Procedures  :     Small bowel follow-through 05/28/2024.  No  extravasation of contrast.    CT 05/28/2024- 1. Findings which remain concerning for the presence of contained free air within the mid right abdomen and right upper quadrant, as described above. 2. Markedly inflamed, partially opacified small bowel loops within the mid and upper right abdomen. 3. Curvilinear area of oral contrast within the lateral aspect of the mid to lower right abdomen which may be within a decompressed small bowel loop, however, intraluminal location cannot completely be confirmed. 4. Stable moderate to marked severity areas of atelectasis and/or infiltrate within the bilateral lower lobes. 5. Colonic diverticulosis. 6. Aortic atherosclerosis.      Disposition Plan  :    Status is: Inpatient  DVT Prophylaxis  :  Hep gtt  SCDs Start: 05/28/24 0530 apixaban  (ELIQUIS ) tablet 5 mg    Lab Results  Component Value Date   PLT 363 06/07/2024    Diet :  Diet Order             Diet regular Fluid consistency: Thin  Diet effective now                    Inpatient Medications  Scheduled Meds:  (feeding supplement) PROSource Plus  30 mL Oral BID AC & HS   apixaban   5 mg Oral BID   Chlorhexidine  Gluconate Cloth  6 each Topical Daily   ferrous sulfate   325 mg Oral Q breakfast   gabapentin   200 mg Oral TID   loperamide   4 mg Oral TID WC   magic mouthwash  5 mL Oral QID   pantoprazole  (PROTONIX ) IV  40 mg Intravenous Q12H   polycarbophil  625 mg Oral Daily   sodium chloride  flush  10-40 mL Intracatheter Q12H   sodium chloride  flush  3 mL Intravenous Q12H   traZODone   100 mg Oral QHS   Continuous Infusions:  linezolid  (ZYVOX ) IV 600 mg (06/07/24 1042)   piperacillin -tazobactam (ZOSYN )  IV 3.375 g (06/07/24 1217)   TPN ADULT (ION) 65 mL/hr at 06/06/24 1819   TPN ADULT (ION)     PRN Meds:.acetaminophen , albuterol , diphenhydrAMINE , morphine  injection **  OR** morphine  injection, sodium chloride  flush, trimethobenzamide     Objective:   Vitals:   06/06/24 2359  06/07/24 0402 06/07/24 0832 06/07/24 1116  BP:  (!) 147/74 (!) 110/53 (!) 125/39  Pulse: 82  79 79  Resp: 18 18 18 18   Temp: 98 F (36.7 C) 98.1 F (36.7 C) 98.2 F (36.8 C) 98.5 F (36.9 C)  TempSrc: Oral Oral Oral Oral  SpO2: 98% 98% 93% 95%  Weight:      Height:        Wt Readings from Last 3 Encounters:  06/06/24 67.7 kg  04/29/24 55 kg  04/06/24 71 kg     Intake/Output Summary (Last 24 hours) at 06/07/2024 1354 Last data filed at 06/07/2024 9167 Gross per 24 hour  Intake 928.23 ml  Output 2675 ml  Net -1746.77 ml     Physical Exam  Awake Alert, Oriented X 3, No new F.N deficits, Normal affect Symmetrical Chest wall movement, Good air movement bilaterally, CTAB RRR,No Gallops,Rubs or new Murmurs, No Parasternal Heave +ve B.Sounds, Abd Soft, ileostomy present, abdominal wound bandaged No edema         Data Review:    Recent Labs  Lab 06/01/24 0252 06/02/24 0332 06/03/24 0331 06/04/24 0927 06/05/24 0241 06/05/24 1833 06/06/24 0017 06/07/24 0422  WBC 5.6 6.1 6.1 6.1 6.5  --  8.4 6.2  HGB 8.1* 7.8* 7.6* 7.5* 7.2* 8.8* 8.8* 8.8*  HCT 25.6* 25.3* 24.6* 24.1* 24.0* 28.4* 28.6* 28.2*  PLT 293 292 308 371 380  --  370 363  MCV 95.2 96.6 96.5 98.0 100.4*  --  96.9 95.6  MCH 30.1 29.8 29.8 30.5 30.1  --  29.8 29.8  MCHC 31.6 30.8 30.9 31.1 30.0  --  30.8 31.2  RDW 16.0* 16.4* 16.6* 17.2* 17.2*  --  18.7* 17.8*  LYMPHSABS 1.2 0.8 1.4  --   --   --   --   --   MONOABS 0.6 0.4 0.5  --   --   --   --   --   EOSABS 0.1 0.1 0.0  --   --   --   --   --   BASOSABS 0.0 0.1 0.0  --   --   --   --   --     Recent Labs  Lab 06/01/24 0252 06/02/24 0332 06/03/24 0331 06/04/24 0038 06/05/24 0241 06/06/24 0017 06/07/24 0422  NA 134* 138 139 138 138 135 136  K 4.4 4.1 4.3 4.5 4.5 4.3 4.5  CL 101 102 104 105 103 100 99  CO2 28 26 25 23 25 25 26   ANIONGAP 5 10 10 10 10 10 11   GLUCOSE 116* 128* 119* 131* 120* 109* 104*  BUN <5* 8 8 10 12 12 12   CREATININE 0.53*  0.62 0.70 0.80 0.81 0.82 0.81  AST 12* 12* 14* 18  --   --   --   ALT 10 9 11 13   --   --   --   ALKPHOS 49 44 48 47  --   --   --   BILITOT 0.2 0.3 0.3 0.3  --   --   --   ALBUMIN  2.1* 2.3* 2.3* 2.4*  --   --   --   CRP 7.4* 4.2*  --   --   --   --   --   PROCALCITON 0.53 0.29 0.15  --   --   --   --  INR 1.0  --   --   --   --   --   --   MG 1.8 1.9  --  1.8 1.8 2.3  --   PHOS 5.2* 4.5  --  3.4  --  5.1* 6.3*  CALCIUM  8.7* 8.7* 8.9 9.0 9.1 9.1 9.4      Recent Labs  Lab 06/01/24 0252 06/02/24 0332 06/03/24 0331 06/04/24 0038 06/05/24 0241 06/06/24 0017 06/07/24 0422  CRP 7.4* 4.2*  --   --   --   --   --   PROCALCITON 0.53 0.29 0.15  --   --   --   --   INR 1.0  --   --   --   --   --   --   MG 1.8 1.9  --  1.8 1.8 2.3  --   CALCIUM  8.7* 8.7* 8.9 9.0 9.1 9.1 9.4    --------------------------------------------------------------------------------------------------------------- Lab Results  Component Value Date   CHOL 114 12/03/2023   HDL 43.40 12/03/2023   LDLCALC 58 12/03/2023   TRIG 45 06/01/2024   CHOLHDL 3 12/03/2023    Lab Results  Component Value Date   HGBA1C 5.9 12/03/2023   No results for input(s): TSH, T4TOTAL, FREET4, T3FREE, THYROIDAB in the last 72 hours. No results for input(s): VITAMINB12, FOLATE, FERRITIN, TIBC, IRON , RETICCTPCT in the last 72 hours. ------------------------------------------------------------------------------------------------------------------ Cardiac Enzymes No results for input(s): CKMB, TROPONINI, MYOGLOBIN in the last 168 hours.  Invalid input(s): CK  Micro Results No results found for this or any previous visit (from the past 240 hours).   Radiology Report CT ABDOMEN PELVIS W CONTRAST Result Date: 06/05/2024 CLINICAL DATA:  History of partial colectomy and wound dehiscence status post ileocolic anastomosis and diverting loop ileostomy with concern for small bowel perforation and possible  small bowel fistula. EXAM: CT ABDOMEN AND PELVIS WITH CONTRAST TECHNIQUE: Multidetector CT imaging of the abdomen and pelvis was performed using the standard protocol following bolus administration of intravenous contrast. RADIATION DOSE REDUCTION: This exam was performed according to the departmental dose-optimization program which includes automated exposure control, adjustment of the mA and/or kV according to patient size and/or use of iterative reconstruction technique. CONTRAST:  75mL OMNIPAQUE  IOHEXOL  350 MG/ML SOLN COMPARISON:  CT abdomen and pelvis dated 05/31/2024 and multiple priors FINDINGS: Lower chest: Slightly decreased but persistent bilateral lower lobe atelectasis/consolidation. Interval near-complete resolution of right middle lobe ground-glass opacities. Resolution of pleural effusions. No pneumothorax. Partially imaged heart size is normal. Coronary artery calcifications. Hepatobiliary: No focal hepatic lesions. No intra or extrahepatic biliary ductal dilation. Normal gallbladder. Pancreas: No focal lesions or main ductal dilation. Spleen: Normal in size without focal abnormality. Adrenals/Urinary Tract: No adrenal nodules. No suspicious renal mass, calculi or hydronephrosis. Decreased but persistent mild bladder wall thickening at the dome. Stomach/Bowel: Normal appearance of the stomach. Postsurgical changes of the rectosigmoid colon. Patent ileocolic anastomosis deep to the umbilicus. Right lower quadrant ostomy. Colonic diverticulosis without acute diverticulitis. No abnormal bowel dilation. Interval markedly improved inflammatory change involving the proximal duodenum with decreased but persistent mild mural thickening and peri-duodenal inflammatory change. Similar tethered appearance of small bowel to the right paracolic gutter with thickening of the right pararenal fascia. Previously noted extraluminal contrast material is no longer seen. Foci of gas in this area appear intra-luminal.  Vascular/Lymphatic: Aortic atherosclerosis. No enlarged abdominal or pelvic lymph nodes. Reproductive: Prostate is unremarkable. Other: Trace presacral fluid.  No free air. Musculoskeletal: No acute or abnormal lytic or blastic  osseous lesions. Small fat-containing bilateral inguinal hernias. Postsurgical changes of the anterior abdominal wall. 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 Small volume bilateral flank edema. IMPRESSION: 1. Interval markedly improved inflammatory change involving the proximal duodenum with decreased but persistent mild mural thickening and peri-duodenal inflammatory change. Previously noted extraluminal contrast material is no longer seen. 2. Similar tethered appearance of small bowel to the right paracolic gutter with thickening of the right pararenal fascia. Scattered foci of gas in this area appear intraluminal within the tethered bowel loops. 3. Slightly decreased but persistent bilateral lower lobe atelectasis/consolidation. Interval near-complete resolution of right middle lobe ground-glass opacities. Resolution of pleural effusions. 4.  Aortic Atherosclerosis (ICD10-I70.0). Electronically Signed   By: Limin  Xu M.D.   On: 06/05/2024 17:23      Signature  -   Brayton Lye M.D on 06/07/2024 at 1:54 PM   -  To page go to [email or web address]

## 2024-06-07 NOTE — Progress Notes (Signed)
 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 Usual Weight: 68.2 Kg  Assessment:  63 yo M with complex surgical history including 12/2023 developed colonic perforation I/s/o diverticulitis requiring emergent ex lap initially left in discontinuity and required additional subsequent SBR and ileostomy, complicated postoperative course including cardiac arrest, shock, renal failure requiring CRRT, intrabdominal abscess requiring drainage including VRE intraabdominal infection. Most recent surgery 04/13/24 with ileocolic anastomosis and diverting loop ileostomy for fascial dehiscence. Admitted 8/27 with NVD and concern for perforated small bowel. Pharmacy consulted for TPN.  Glucose / Insulin : no hx DM, A1c 5.9% - CBGs < 150 SSI D/C'ed 06/01/24 Electrolytes: all WNL except CoCa10.8, Phos 6.3 Renal: SCr < 1, BUN WNL Hepatic: LFTs / tbili / TG WNL, albumin  2.3 Intake / Output; MIVF:  UOP 1.1 ml/kg/hr, ostomy 1.4 L output GI Imaging: 8/27 CT: marked enteritis, bowel perforation 8/28 KUB: non-obstructed small bowel 8/28 CT: contained free air RUQ, markedly inflamed SB loops   8/31 CT: persistent inflammation and loculated gas around duodenum, suspect SB fistula 9/5 CTAP improved inflammatory changes in prox duodenum and decreased but persistent thickening   GI Surgeries / Procedures:  7/14 - ileostomy takedown 8/27 post op check by Dr. Tanda - return 9/27 for further eval of abd wound dehiscence  Central access: PICC placed 05/29/2024 TPN start date: 05/29/2024  Nutritional Goals: Goal TPN rate is 85 mL/hr (52 g/L AA, 15% CHO, 32 g/L ILE to provide 107g AA and 2117 kcals per day)  RD Estimated Needs Total Energy Estimated Needs: 2100-2300 Total Protein Estimated  Needs: 105-125 grams Total Fluid Estimated Needs: >/= 2 L  Current Nutrition:  TPN Soft diet since 9/2 - eating well per pt; could not quantity amount and was irritable  ProSource Plus BID - 2 charted on 9/4 9/4 calorie count: inadequate intake Changed to regular diet   Plan:  Decrease TPN to 50% today , begin weaning again per MDs. Charted >75% of meal intake for a few days - TPN to 40 ml/hr starting at 1800 Electrolytes in TPN: Na 154 mEq/L, K 60 mEq/L, Ca 0 mEq/L, Mag 10 mEq/L, Phos 7 mmol/L, Cl:Ac 1:1 - adjusted with adjusted rate Continue standard MVI and trace elements to TPN Monitor TPN labs on Mon/Thurs - labs in AM  Sharyne Glatter, PharmD, BCCCP Critical Care Clinical Pharmacist 06/07/2024 7:28 AM

## 2024-06-08 ENCOUNTER — Telehealth (HOSPITAL_COMMUNITY): Payer: Self-pay | Admitting: Pharmacy Technician

## 2024-06-08 ENCOUNTER — Other Ambulatory Visit (HOSPITAL_COMMUNITY): Payer: Self-pay

## 2024-06-08 DIAGNOSIS — K668 Other specified disorders of peritoneum: Secondary | ICD-10-CM | POA: Diagnosis not present

## 2024-06-08 LAB — PHOSPHORUS: Phosphorus: 5.6 mg/dL — ABNORMAL HIGH (ref 2.5–4.6)

## 2024-06-08 LAB — COMPREHENSIVE METABOLIC PANEL WITH GFR
ALT: 13 U/L (ref 0–44)
AST: 16 U/L (ref 15–41)
Albumin: 2.5 g/dL — ABNORMAL LOW (ref 3.5–5.0)
Alkaline Phosphatase: 45 U/L (ref 38–126)
Anion gap: 7 (ref 5–15)
BUN: 12 mg/dL (ref 8–23)
CO2: 26 mmol/L (ref 22–32)
Calcium: 9.4 mg/dL (ref 8.9–10.3)
Chloride: 99 mmol/L (ref 98–111)
Creatinine, Ser: 0.85 mg/dL (ref 0.61–1.24)
GFR, Estimated: >60 mL/min (ref >60)
Glucose, Bld: 121 mg/dL — ABNORMAL HIGH (ref 70–99)
Potassium: 4 mmol/L (ref 3.5–5.1)
Sodium: 132 mmol/L — ABNORMAL LOW (ref 135–145)
Total Bilirubin: 0.3 mg/dL (ref 0.0–1.2)
Total Protein: 7.6 g/dL (ref 6.5–8.1)

## 2024-06-08 LAB — CBC
HCT: 29.1 % — ABNORMAL LOW (ref 39.0–52.0)
Hemoglobin: 9 g/dL — ABNORMAL LOW (ref 13.0–17.0)
MCH: 29.8 pg (ref 26.0–34.0)
MCHC: 30.9 g/dL (ref 30.0–36.0)
MCV: 96.4 fL (ref 80.0–100.0)
Platelets: 354 K/uL (ref 150–400)
RBC: 3.02 MIL/uL — ABNORMAL LOW (ref 4.22–5.81)
RDW: 17.4 % — ABNORMAL HIGH (ref 11.5–15.5)
WBC: 6.8 K/uL (ref 4.0–10.5)
nRBC: 0 % (ref 0.0–0.2)

## 2024-06-08 LAB — TRIGLYCERIDES: Triglycerides: 47 mg/dL (ref <150)

## 2024-06-08 LAB — MAGNESIUM: Magnesium: 1.6 mg/dL — ABNORMAL LOW (ref 1.7–2.4)

## 2024-06-08 MED ORDER — ORAL CARE MOUTH RINSE: 15.0000 mL | OROMUCOSAL | Status: DC | PRN

## 2024-06-08 MED ORDER — MAGNESIUM SULFATE 2 GM/50ML IV SOLN
2.0000 g | Freq: Once | INTRAVENOUS | Status: AC
  Administered 2024-06-08: 2 g via INTRAVENOUS

## 2024-06-08 MED ORDER — METHOCARBAMOL 500 MG PO TABS
500.0000 mg | ORAL_TABLET | Freq: Three times a day (TID) | ORAL | Status: DC
  Administered 2024-06-08 – 2024-06-10 (×9): 500 mg via ORAL
  Filled 2024-06-08 (×10): qty 1

## 2024-06-08 MED ORDER — MAGNESIUM SULFATE 4 GM/100ML IV SOLN
4.0000 g | Freq: Once | INTRAVENOUS | Status: AC
  Administered 2024-06-08: 4 g via INTRAVENOUS
  Filled 2024-06-08: qty 100

## 2024-06-08 MED ORDER — LOPERAMIDE HCL 2 MG PO CAPS
4.0000 mg | ORAL_CAPSULE | Freq: Four times a day (QID) | ORAL | Status: DC
  Administered 2024-06-08 – 2024-06-10 (×11): 4 mg via ORAL
  Filled 2024-06-08 (×13): qty 2

## 2024-06-08 MED ORDER — OXYCODONE HCL 5 MG PO TABS
5.0000 mg | ORAL_TABLET | ORAL | Status: DC | PRN
  Administered 2024-06-08 – 2024-06-11 (×9): 5 mg via ORAL
  Filled 2024-06-08 (×9): qty 1

## 2024-06-08 NOTE — Progress Notes (Signed)
 Physical Therapy Treatment Patient Details Name: Alexander [name] MRN: [id number] DOB: May 19, 1961 Today's Date: 06/08/2024   History of Present Illness The pt is a 63 yo male presenting 8/27 from [facility] with n/v and lethargy. Work up revealed pneumoperitoneum and sepsis, likely due to intraabdominal infection. Pt with extensive hx of abdominal surgery including  emergent laparotomy and right hemicolectomy (4/25), returned to the OR twice for additional SBR and eventual end ileostomy with abdominal closure, returned to OR for ex-lap with sigmoid colectomy, ileocolonic anastomosis, and creation of a loop ileostomy after wound dehiscence (7/25). Additional PMH includes: PE, DVT, HTN, and pulmonary HTN.    PT Comments  Pt with fair tolerance to treatment today. Pt today was able to ambulate short distance in hallway with RW CGA/Min A. No change in DC/DME recs at this time. PT will continue to follow.     If plan is discharge home, recommend the following: Two people to help with walking and/or transfers;Two people to help with bathing/dressing/bathroom;Direct supervision/assist for medications management;Direct supervision/assist for financial management;Help with stairs or ramp for entrance   Can travel by private vehicle     No  Equipment Recommendations  Wheelchair (measurements PT);Wheelchair cushion (measurements PT)    Recommendations for Other Services       Precautions / Restrictions Precautions Precautions: Fall;Other (comment) Recall of Precautions/Restrictions: Impaired Precaution/Restrictions Comments: abdominal, ileostomy Restrictions Weight Bearing Restrictions Per Provider Order: No     Mobility  Bed Mobility Overal bed mobility: Needs Assistance Bed Mobility: Supine to Sit     Supine to sit: Contact guard     General bed mobility comments: no physical assistance provided.    Transfers Overall transfer level: Needs assistance Equipment used: Rolling walker (2  wheels) Transfers: Sit to/from Stand Sit to Stand: Min assist           General transfer comment: Min A to stand today with cues for hand placement.    Ambulation/Gait Ambulation/Gait assistance: Contact guard assist, Min assist Gait Distance (Feet): 60 Feet Assistive device: Rolling walker (2 wheels) Gait Pattern/deviations: Trunk flexed, Decreased stride length, Step-through pattern Gait velocity: reduced     General Gait Details: Pt only agreeable to ambulate short distance in hallway. Cues for proximity to RW. Slowed step through pattern.   Stairs             Wheelchair Mobility     Tilt Bed    Modified Rankin (Stroke Patients Only)       Balance Overall balance assessment: Mild deficits observed, not formally tested                                          Communication Communication Communication: No apparent difficulties Factors Affecting Communication: Reduced clarity of speech  Cognition Arousal: Alert Behavior During Therapy: Agitated, WFL for tasks assessed/performed                           PT - Cognition Comments: Pt mostly calm today however did have some periods of agitation with RN. Following commands: Intact (when agreeable)      Cueing Cueing Techniques: Verbal cues  Exercises      General Comments General comments (skin integrity, edema, etc.): VSS      Pertinent Vitals/Pain Pain Assessment Pain Assessment: Faces Faces Pain Scale: Hurts a little bit Pain Location: abdomen Pain  Descriptors / Indicators: Discomfort Pain Intervention(s): Monitored during session    Home Living                          Prior Function            PT Goals (current goals can now be found in the care plan section) Progress towards PT goals: Progressing toward goals    Frequency    Min 1X/week      PT Plan      Co-evaluation              AM-PAC PT 6 Clicks Mobility   Outcome  Measure  Help needed turning from your back to your side while in a flat bed without using bedrails?: A Little Help needed moving from lying on your back to sitting on the side of a flat bed without using bedrails?: A Little Help needed moving to and from a bed to a chair (including a wheelchair)?: A Little Help needed standing up from a chair using your arms (e.g., wheelchair or bedside chair)?: A Little Help needed to walk in hospital room?: A Little Help needed climbing 3-5 steps with a railing? : A Lot 6 Click Score: 17    End of Session Equipment Utilized During Treatment: Gait belt Activity Tolerance: Patient tolerated treatment well Patient left: in chair;with call bell/phone within reach Nurse Communication: Mobility status PT Visit Diagnosis: Unsteadiness on feet (R26.81);Muscle weakness (generalized) (M62.81)     Time: 8563-8542 PT Time Calculation (min) (ACUTE ONLY): 21 min  Charges:    $Gait Training: 8-22 mins PT General Charges $$ ACUTE PT VISIT: 1 Visit                     Sueellen NOVAK, PT, DPT Acute Rehab Services [phone]    Saajan Willmon 06/08/2024, 4:22 PM

## 2024-06-08 NOTE — Discharge Instructions (Signed)
 Information on my medicine - ELIQUIS  (apixaban )  This medication education was reviewed with me or my healthcare representative as part of my discharge preparation.    Why was Eliquis  prescribed for you? Eliquis  was prescribed for you to reduce the risk of forming blood clots in your legs (deep vein thrombosis) or lungs (pulmonary embolus) given you history of blood clot.    What do You need to know about Eliquis  ? Take your Eliquis  TWICE DAILY - one tablet in the morning and one tablet in the evening with or without food.  It would be best to take the doses about the same time each day.  If you have difficulty swallowing the tablet whole please discuss with your pharmacist how to take the medication safely.  Take Eliquis  exactly as prescribed by your doctor and DO NOT stop taking Eliquis  without talking to the doctor who prescribed the medication.  Stopping may increase your risk of developing a new clot or stroke.  Refill your prescription before you run out.  After discharge, you should have regular check-up appointments with your healthcare provider that is prescribing your Eliquis .  In the future your dose may need to be changed if your kidney function or weight changes by a significant amount or as you get older.  What do you do if you miss a dose? If you miss a dose, take it as soon as you remember on the same day and resume taking twice daily.  Do not take more than one dose of ELIQUIS  at the same time.  Important Safety Information A possible side effect of Eliquis  is bleeding. You should call your healthcare provider right away if you experience any of the following: Bleeding from an injury or your nose that does not stop. Unusual colored urine (red or dark brown) or unusual colored stools (red or black). Unusual bruising for unknown reasons. A serious fall or if you hit your head (even if there is no bleeding).  Some medicines may interact with Eliquis  and might  increase your risk of bleeding or clotting while on Eliquis . To help avoid this, consult your healthcare provider or pharmacist prior to using any new prescription or non-prescription medications, including herbals, vitamins, non-steroidal anti-inflammatory drugs (NSAIDs) and supplements.  This website has more information on Eliquis  (apixaban ): [email or web address].

## 2024-06-08 NOTE — Progress Notes (Signed)
 PROGRESS NOTE                                                                                                                                                                                                             Patient Demographics:    Alexander [name], is a 63 y.o. male, DOB - 01-10-1961, FMW:[id number]  Outpatient Primary MD for the patient is [name], [name]    LOS - 11  Admit date - 05/27/2024    Chief Complaint  Patient presents with   Abdominal Pain   Nausea   Weakness   Emesis       Brief Narrative (HPI from H&P)     63 y.o. male with complex hx in 4/'25 developed colonic perforation I/s/o diverticulitis requiring emergent ex lap initially left in discontinuity and required additional subsequent SBR and ileostomy, complicated postoperative course including cardiac arrest, shock, renal failure requiring CRRT, intrabdominal abscess requiring drainage including VRE intraabdominal infection (S to Daptomycin ), ultimately discharged to [facility], and readmitted in 7/'25 with wound dehiscence and underwent ex lap 7/14 with extensive LOA, sigmoid colectomy and ileocolonic anastamosis and creation of a loop ileostomy, and 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 Presented with acute onset of N/V, abd pain, imaging was suggestive of some free air in the abdomen which probably contained perforation, seen by general surgery and admitted to the hospital.   Subjective:   Ports abdominal cramping   Assessment  & Plan :   Sepsis, nausea vomiting, secondary to possible contained intra-abdominal perforation.  Patient with history of multiple abdominal surgeries and recent intra-abdominal abscess, has ileostomy.     -He has been seen by general surgery, he has complicated history of multiple abdominal surgeries,  recent intra-abdominal abscess, ileostomy.  There is suspicion of contained perforation, he has been treated conservatively with bowel rest, IV fluids, TNA, pain control and antibiotics.  Will defer management of this issue to general surgery who is following the patient.  - Tolerating soft diet, TPN to be tapered off today  -Continue with IV antibiotics -CT scan obtained 9/5 showing improvement, continue with current antibiotics per general surgery recommendations. - Per general surgery can resume oral anticoagulation, please see discussion below. - Started on scheduled Imodium  per general  surgery for increased ileostomy output. - Increase Imodium  to 4 mg 4 times daily per general surgery  Prolonged Qtc  Avoid QT prolonging medications, replace electrolytes, monitor on telemetry.   Incidental rhinovirus URI.  Asymptomatic.  Monitor.    Hypomagnesemia and hypokalemia.  Replaced.    Chronic medical problems: Reintroduce oral medications as he is more consistent with oral diet.  Complex Surgical Hx per above, noted   DVT/PE:  - He is on warfarin at home due to cost  - On heparin  gtt. on admission once INR less than 2, for possible need for surgery.   - No intervention planned by general surgery, cleared to start anticoagulation, pharmacy has checked benefits and co-pay for Eliquis  or Xarelto  0, so patient is started on Xarelto .    Diastolic HF: Without exacerbation, dry clinically.   HTN: not on antiHTN at time of admission   Anemia: - Received 1 unit PRBC 9/5, hemoglobin stable  Chronic pain: Home regimen is Dilaudid  2 mg PO q 4 hr prn, see acute pain management above. Holding home Gabapentin .   ?insomnia/mood: holding trazodone    ?? ASCVD: Hold aspirin ; unclear indication since on Warfarin per above      Condition - Extremely Guarded  Family Communication  : None present  Code Status : Full code  Consults  : General surgery  PUD Prophylaxis : PPI   Procedures  :      Small bowel follow-through 05/28/2024.  No extravasation of contrast.    CT 05/28/2024- 1. Findings which remain concerning for the presence of contained free air within the mid right abdomen and right upper quadrant, as described above. 2. Markedly inflamed, partially opacified small bowel loops within the mid and upper right abdomen. 3. Curvilinear area of oral contrast within the lateral aspect of the mid to lower right abdomen which may be within a decompressed small bowel loop, however, intraluminal location cannot completely be confirmed. 4. Stable moderate to marked severity areas of atelectasis and/or infiltrate within the bilateral lower lobes. 5. Colonic diverticulosis. 6. Aortic atherosclerosis.      Disposition Plan  :    Status is: Inpatient  DVT Prophylaxis  :  Hep gtt  SCDs Start: 05/28/24 0530 apixaban  (ELIQUIS ) tablet 5 mg    Lab Results  Component Value Date   PLT 354 06/08/2024    Diet :  Diet Order             Diet regular Fluid consistency: Thin  Diet effective now                    Inpatient Medications  Scheduled Meds:  (feeding supplement) PROSource Plus  30 mL Oral BID AC & HS   apixaban   5 mg Oral BID   Chlorhexidine  Gluconate Cloth  6 each Topical Daily   ferrous sulfate   325 mg Oral Q breakfast   gabapentin   200 mg Oral TID   loperamide   4 mg Oral QID   magic mouthwash  5 mL Oral QID   methocarbamol   500 mg Oral TID   pantoprazole  (PROTONIX ) IV  40 mg Intravenous Q12H   polycarbophil  625 mg Oral Daily   sodium chloride  flush  10-40 mL Intracatheter Q12H   sodium chloride  flush  3 mL Intravenous Q12H   traZODone   100 mg Oral QHS   Continuous Infusions:  linezolid  (ZYVOX ) IV 600 mg (06/08/24 0937)   magnesium  sulfate bolus IVPB 4 g (06/08/24 1132)   piperacillin -tazobactam (ZOSYN )  IV 3.375 g (06/08/24 1131)   TPN ADULT (ION) 40 mL/hr at 06/07/24 1754   PRN Meds:.acetaminophen , albuterol , diphenhydrAMINE , morphine  injection **OR**  morphine  injection, mouth rinse, oxyCODONE , sodium chloride  flush, trimethobenzamide     Objective:   Vitals:   06/08/24 0125 06/08/24 0500 06/08/24 0523 06/08/24 0919  BP: (!) 160/50   113/63  Pulse: 74     Resp: 18  20 20   Temp: 97.9 F (36.6 C)   98.1 F (36.7 C)  TempSrc: Oral   Oral  SpO2: 95%   95%  Weight:  68.1 kg    Height:        Wt Readings from Last 3 Encounters:  06/08/24 68.1 kg  04/29/24 55 kg  04/06/24 71 kg     Intake/Output Summary (Last 24 hours) at 06/08/2024 1206 Last data filed at 06/08/2024 0933 Gross per 24 hour  Intake 720 ml  Output 2150 ml  Net -1430 ml     Physical Exam  Awake Alert, Oriented X 3, No new F.N deficits, Normal affect Symmetrical Chest wall movement, Good air movement bilaterally, CTAB RRR,No Gallops,Rubs or new Murmurs, No Parasternal Heave +ve B.Sounds, Abd Soft, ileostomy present, abdominal wound bandaged No edema         Data Review:    Recent Labs  Lab 06/02/24 0332 06/03/24 0331 06/04/24 0927 06/05/24 0241 06/05/24 1833 06/06/24 0017 06/07/24 0422 06/08/24 0306  WBC 6.1 6.1 6.1 6.5  --  8.4 6.2 6.8  HGB 7.8* 7.6* 7.5* 7.2* 8.8* 8.8* 8.8* 9.0*  HCT 25.3* 24.6* 24.1* 24.0* 28.4* 28.6* 28.2* 29.1*  PLT 292 308 371 380  --  370 363 354  MCV 96.6 96.5 98.0 100.4*  --  96.9 95.6 96.4  MCH 29.8 29.8 30.5 30.1  --  29.8 29.8 29.8  MCHC 30.8 30.9 31.1 30.0  --  30.8 31.2 30.9  RDW 16.4* 16.6* 17.2* 17.2*  --  18.7* 17.8* 17.4*  LYMPHSABS 0.8 1.4  --   --   --   --   --   --   MONOABS 0.4 0.5  --   --   --   --   --   --   EOSABS 0.1 0.0  --   --   --   --   --   --   BASOSABS 0.1 0.0  --   --   --   --   --   --     Recent Labs  Lab 06/02/24 0332 06/03/24 0331 06/04/24 0038 06/05/24 0241 06/06/24 0017 06/07/24 0422 06/08/24 0306  NA 138 139 138 138 135 136 132*  K 4.1 4.3 4.5 4.5 4.3 4.5 4.0  CL 102 104 105 103 100 99 99  CO2 26 25 23 25 25 26 26   ANIONGAP 10 10 10 10 10 11 7   GLUCOSE 128* 119*  131* 120* 109* 104* 121*  BUN 8 8 10 12 12 12 12   CREATININE 0.62 0.70 0.80 0.81 0.82 0.81 0.85  AST 12* 14* 18  --   --   --  16  ALT 9 11 13   --   --   --  13  ALKPHOS 44 48 47  --   --   --  45  BILITOT 0.3 0.3 0.3  --   --   --  0.3  ALBUMIN  2.3* 2.3* 2.4*  --   --   --  2.5*  CRP 4.2*  --   --   --   --   --   --  PROCALCITON 0.29 0.15  --   --   --   --   --   MG 1.9  --  1.8 1.8 2.3  --  1.6*  PHOS 4.5  --  3.4  --  5.1* 6.3* 5.6*  CALCIUM  8.7* 8.9 9.0 9.1 9.1 9.4 9.4      Recent Labs  Lab 06/02/24 0332 06/03/24 0331 06/04/24 0038 06/05/24 0241 06/06/24 0017 06/07/24 0422 06/08/24 0306  CRP 4.2*  --   --   --   --   --   --   PROCALCITON 0.29 0.15  --   --   --   --   --   MG 1.9  --  1.8 1.8 2.3  --  1.6*  CALCIUM  8.7* 8.9 9.0 9.1 9.1 9.4 9.4    --------------------------------------------------------------------------------------------------------------- Lab Results  Component Value Date   CHOL 114 12/03/2023   HDL 43.40 12/03/2023   LDLCALC 58 12/03/2023   TRIG 47 06/08/2024   CHOLHDL 3 12/03/2023    Lab Results  Component Value Date   HGBA1C 5.9 12/03/2023   No results for input(s): TSH, T4TOTAL, FREET4, T3FREE, THYROIDAB in the last 72 hours. No results for input(s): VITAMINB12, FOLATE, FERRITIN, TIBC, IRON , RETICCTPCT in the last 72 hours. ------------------------------------------------------------------------------------------------------------------ Cardiac Enzymes No results for input(s): CKMB, TROPONINI, MYOGLOBIN in the last 168 hours.  Invalid input(s): CK  Micro Results No results found for this or any previous visit (from the past 240 hours).   Radiology Report No results found.     Signature  -   Brayton Lye M.D on 06/08/2024 at 12:06 PM   -  To page go to [email or web address]

## 2024-06-08 NOTE — Progress Notes (Signed)
 [facility] night cross cover note:   I was notified by the patient's RN  of mag level this AM of 1.6. I subsequently ordered 2 g of iv mag sulfate over 2 hours.      Eva Pore, DO Hospitalist

## 2024-06-08 NOTE — Consult Note (Signed)
 WOC Nurse ostomy follow up Stoma type/location: RLQ ileostomy, has been higher output last few days.  On Immodium, fiber, iron .  Pouch was changed 06/05/24 by this Clinical research associate and is still intact.   Stomal assessment/size: 1  Peristomal assessment: Skin is intact, creasing at 3 and 9 o'clock.  Some redness below os beneath stoma Treatment options for stomal/peristomal skin: Stoma powder and skin prep .  Barrier strips to creasing at 3 and 9 o'clock.  Barrier ring around entire stoma.  2 piece flexible pouch.  Barrier strips to perimeter of pouch. Ostomy belt.  Output liquid tan stool Ostomy pouching: 2pc. Flexible convex with barrier ring, barrier strips and ostomy belt.   Education provided: Patient is able to perform pouch change with minimal assistance today.  IF pouch leaks, he feels he can change along with bedside nursing assistance.  He will be [facility]. On last stay, they were unable to order his needed pouches.  His home supply has been shipped from [place]. I encourage him once again to check that shipment. Ensure its accuracy as he may have to take that with him to rehab.  [facility] will not have his needed supplies once he discharges to SNF.  He verbalizes understanding Enrolled patient in [place] Secure Start Discharge program: Yes  But wearing coloplast  Will recheck Thursday for pouch change and to ensure he has needed home supplies to take to rehab.  There is an additional pouch set at the bedside.  Darice Cooley MSN, RN, FNP-BC CWON Wound, Ostomy, Continence Nurse Outpatient [facility] [phone] Work cell phone:  [phone]

## 2024-06-08 NOTE — TOC Progression Note (Signed)
 Transition of Care [facility]) - Progression Note    Patient Details  Name: Alexander [name] MRN: [id number] Date of Birth: 08-08-1961  Transition of Care [facility]) CM/SW Contact  Inocente GORMAN Kindle, LCSW Phone Number: 06/08/2024, 9:34 AM  Clinical Narrative:    CSW following to start insurance authorization for [facility] SNF closer to patient being medically ready for dc.    Expected Discharge Plan: Skilled Nursing Facility Barriers to Discharge: Continued Medical Work up, English as a second language teacher               Expected Discharge Plan and Services In-house Referral: Clinical Social Work   Post Acute Care Choice: Skilled Nursing Facility Living arrangements for the past 2 months: Single Family Home, Skilled Nursing Facility                                       Social Drivers of Health (SDOH) Interventions SDOH Screenings   Food Insecurity: Patient Unable To Answer (05/28/2024)  Housing: Unknown (05/28/2024)  Recent Concern: Housing - High Risk (04/10/2024)  Transportation Needs: Patient Unable To Answer (05/28/2024)  Recent Concern: Transportation Needs - Unmet Transportation Needs (03/30/2024)  Utilities: Not At Risk (05/28/2024)  Depression (PHQ2-9): Low Risk  (07/24/2023)  Financial Resource Strain: Low Risk  (05/01/2024)   Received from [facility]  Social Connections: Moderately Isolated (05/01/2024)   Received from [facility]  Stress: No Stress Concern Present (05/13/2024)   Received from [facility]  Tobacco Use: Medium Risk (05/27/2024)    Readmission Risk Interventions    05/28/2024    2:04 PM 04/30/2024   12:54 PM 04/10/2024    1:55 PM  Readmission Risk Prevention Plan  Transportation Screening Complete Complete Complete  Medication Review Oceanographer) Complete Complete Complete  PCP or Specialist appointment within 3-5 days of discharge Complete Complete Complete  HRI or Home Care Consult Complete Complete Complete  SW Recovery Care/Counseling  Consult Complete Complete Complete  Palliative Care Screening Not Applicable Not Applicable Not Applicable  Skilled Nursing Facility Complete Not Applicable Not Applicable

## 2024-06-08 NOTE — Plan of Care (Signed)

## 2024-06-08 NOTE — Progress Notes (Signed)
 Subjective/Chief Complaint:  complaining of some abdominal cramping/gurgling, wants to take morphine     Objective: Vital signs in last 24 hours: Temp:  [97.9 F (36.6 C)-98.5 F (36.9 C)] 98.1 F (36.7 C) (09/08 0919) Pulse Rate:  [74-79] 74 (09/08 0125) Resp:  [18-20] 20 (09/08 0919) BP: (113-160)/(39-65) 113/63 (09/08 0919) SpO2:  [95 %] 95 % (09/08 0919) Weight:  [68.1 kg] 68.1 kg (09/08 0500) Last BM Date : 06/07/24  Intake/Output from previous day: 09/07 0701 - 09/08 0700 In: 720 [P.O.:720] Out: 2175 [Urine:750; Stool:1425] Intake/Output this shift: Total I/O In: -  Out: 500 [Urine:350; Stool:150]  WDWN in NAD Abd - soft, minimal tenderness; midline incision with good granulation tissue Ileostomy - very thin liquid stool (1,425 mL)  Lab Results:  Recent Labs    06/07/24 0422 06/08/24 0306  WBC 6.2 6.8  HGB 8.8* 9.0*  HCT 28.2* 29.1*  PLT 363 354   BMET Recent Labs    06/07/24 0422 06/08/24 0306  NA 136 132*  K 4.5 4.0  CL 99 99  CO2 26 26  GLUCOSE 104* 121*  BUN 12 12  CREATININE 0.81 0.85  CALCIUM  9.4 9.4   PT/INR No results for input(s): LABPROT, INR in the last 72 hours. ABG No results for input(s): PHART, HCO3 in the last 72 hours.  Invalid input(s): PCO2, PO2  Studies/Results: No results found.   Anti-infectives: Anti-infectives (From admission, onward)    Start     Dose/Rate Route Frequency Ordered Stop   05/29/24 1330  linezolid  (ZYVOX ) IVPB 600 mg        600 mg 300 mL/hr over 60 Minutes Intravenous Every 12 hours 05/29/24 1230     05/28/24 2000  piperacillin -tazobactam (ZOSYN ) IVPB 3.375 g        3.375 g 12.5 mL/hr over 240 Minutes Intravenous Every 8 hours 05/28/24 0534     05/28/24 0900  metroNIDAZOLE  (FLAGYL ) IVPB 500 mg        500 mg 100 mL/hr over 60 Minutes Intravenous  Once 05/28/24 0534 05/28/24 1104   05/28/24 0630  DAPTOmycin  (CUBICIN ) IVPB 700 mg/157mL premix        10 mg/kg  68 kg 200 mL/hr  over 30 Minutes Intravenous  Once 05/28/24 9372 05/28/24 0754   05/27/24 1945  cefTRIAXone  (ROCEPHIN ) 2 g in sodium chloride  0.9 % 100 mL IVPB        2 g 200 mL/hr over 30 Minutes Intravenous Once 05/27/24 1934 05/27/24 2125   05/27/24 1945  metroNIDAZOLE  (FLAGYL ) IVPB 500 mg        500 mg 100 mL/hr over 60 Minutes Intravenous  Once 05/27/24 1934 05/27/24 2236       Assessment/Plan: 56M with complex medical and surgical history, most recent surgery 04/13/24 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 Tanda Regal bowel inflammation with concern for possible perforation - known hostile abdomen - nausea and vomiting, small bowel enteritis and small volume pneumoperitoneum in the RUQ on CT 8/27 - CT 8/28 with concern for inflamed small bowel loop on the right with concern for contained perforation but also possible contrast leak vs contrast within decompressed loop of small  bowel  - SBFT 8/28 without contrast extravasation  - CT 8/31 - same to slightly improved.  - tolerating Soft diet, calorie count, ok to wean off TPN - CT scan 06/05/24 - significant improvement in duodenal inflammation - continue abx - pt remains hemodynamically stable, WBC WNL  -  hgb 8.8     FEN: soft diet, PICC and TPN, ok to wean TPN off; IVF per TRH; monitor ileostomy output - was high today, stable compared to yesterday (~1400). Continue fiber and iron , increase imodium  to 4 mg QID VTE: Eliquis  resumed 9/7, hgb stable ID: Zosyn  8/28>>   - per TRH -  Hx of PE/DVT Pulmonary HTN PAD HTN GERD - PPI Gout   LOS: 11 days    Alexander [name] 06/08/2024

## 2024-06-08 NOTE — Telephone Encounter (Signed)
 Patient Product/process development scientist completed.    The patient is insured through [employer]. Patient has Medicare and is not eligible for a copay card, but may be able to apply for patient assistance or Medicare RX Payment Plan (Patient Must reach out to their plan, if eligible for payment plan), if available.    Ran test claim for Eliquis 5 mg and the current 30 day co-pay is $0.00.   This test claim was processed through [facility]- copay amounts may vary at other pharmacies due to pharmacy/plan contracts, or as the patient moves through the different stages of their insurance plan.     Roland Earl, CPHT Pharmacy Technician III Certified Patient Advocate [facility] Patient Advocate Team Direct Number: [phone]  Fax: [phone]

## 2024-06-09 ENCOUNTER — Ambulatory Visit: Payer: Self-pay

## 2024-06-09 DIAGNOSIS — K668 Other specified disorders of peritoneum: Secondary | ICD-10-CM | POA: Diagnosis not present

## 2024-06-09 LAB — BASIC METABOLIC PANEL WITH GFR
Anion gap: 8 (ref 5–15)
BUN: 8 mg/dL (ref 8–23)
CO2: 25 mmol/L (ref 22–32)
Calcium: 9.1 mg/dL (ref 8.9–10.3)
Chloride: 99 mmol/L (ref 98–111)
Creatinine, Ser: 0.86 mg/dL (ref 0.61–1.24)
GFR, Estimated: >60 mL/min (ref >60)
Glucose, Bld: 88 mg/dL (ref 70–99)
Potassium: 4.1 mmol/L (ref 3.5–5.1)
Sodium: 132 mmol/L — ABNORMAL LOW (ref 135–145)

## 2024-06-09 LAB — CBC
HCT: 27.9 % — ABNORMAL LOW (ref 39.0–52.0)
Hemoglobin: 8.9 g/dL — ABNORMAL LOW (ref 13.0–17.0)
MCH: 30.5 pg (ref 26.0–34.0)
MCHC: 31.9 g/dL (ref 30.0–36.0)
MCV: 95.5 fL (ref 80.0–100.0)
Platelets: 335 K/uL (ref 150–400)
RBC: 2.92 MIL/uL — ABNORMAL LOW (ref 4.22–5.81)
RDW: 17.2 % — ABNORMAL HIGH (ref 11.5–15.5)
WBC: 8.3 K/uL (ref 4.0–10.5)
nRBC: 0 % (ref 0.0–0.2)

## 2024-06-09 NOTE — Progress Notes (Signed)
 Subjective/Chief Complaint: No complaints. Ileostomy output remains watery but lower output (750 mL)  WBC 8,3 Objective: Vital signs in last 24 hours: Temp:  [98.4 F (36.9 C)-98.7 F (37.1 C)] 98.6 F (37 C) (09/09 0856) Pulse Rate:  [74-85] 84 (09/09 0133) Resp:  [16-18] 16 (09/09 0133) BP: (100-125)/(59-74) 125/74 (09/09 0856) SpO2:  [94 %-96 %] 94 % (09/09 0133) Weight:  [68.2 kg] 68.2 kg (09/09 0500) Last BM Date : 06/08/24  Intake/Output from previous day: 09/08 0701 - 09/09 0700 In: 2132.7 [I.V.:33; IV Piggyback:2099.7] Out: 1600 [Urine:850; Stool:750] Intake/Output this shift: No intake/output data recorded.  WDWN in NAD Abd - soft, minimal tenderness; midline incision with good granulation tissue Ileostomy - very thin liquid stool, some sediment  Lab Results:  Recent Labs    06/08/24 0306 06/09/24 0500  WBC 6.8 8.3  HGB 9.0* 8.9*  HCT 29.1* 27.9*  PLT 354 335   BMET Recent Labs    06/08/24 0306 06/09/24 0500  NA 132* 132*  K 4.0 4.1  CL 99 99  CO2 26 25  GLUCOSE 121* 88  BUN 12 8  CREATININE 0.85 0.86  CALCIUM  9.4 9.1   PT/INR No results for input(s): LABPROT, INR in the last 72 hours. ABG No results for input(s): PHART, HCO3 in the last 72 hours.  Invalid input(s): PCO2, PO2  Studies/Results: No results found.   Anti-infectives: Anti-infectives (From admission, onward)    Start     Dose/Rate Route Frequency Ordered Stop   05/29/24 1330  linezolid  (ZYVOX ) IVPB 600 mg        600 mg 300 mL/hr over 60 Minutes Intravenous Every 12 hours 05/29/24 1230     05/28/24 2000  piperacillin -tazobactam (ZOSYN ) IVPB 3.375 g        3.375 g 12.5 mL/hr over 240 Minutes Intravenous Every 8 hours 05/28/24 0534     05/28/24 0900  metroNIDAZOLE  (FLAGYL ) IVPB 500 mg        500 mg 100 mL/hr over 60 Minutes Intravenous  Once 05/28/24 0534 05/28/24 1104   05/28/24 0630  DAPTOmycin  (CUBICIN ) IVPB 700 mg/171mL premix        10 mg/kg  68  kg 200 mL/hr over 30 Minutes Intravenous  Once 05/28/24 9372 05/28/24 0754   05/27/24 1945  cefTRIAXone  (ROCEPHIN ) 2 g in sodium chloride  0.9 % 100 mL IVPB        2 g 200 mL/hr over 30 Minutes Intravenous Once 05/27/24 1934 05/27/24 2125   05/27/24 1945  metroNIDAZOLE  (FLAGYL ) IVPB 500 mg        500 mg 100 mL/hr over 60 Minutes Intravenous  Once 05/27/24 1934 05/27/24 2236       Assessment/Plan: 89M with complex medical and surgical history, most recent surgery 04/13/24 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 Tanda Regal bowel inflammation with concern for possible perforation - known hostile abdomen - nausea and vomiting, small bowel enteritis and small volume pneumoperitoneum in the RUQ on CT 8/27 - CT 8/28 with concern for inflamed small bowel loop on the right with concern for contained perforation but also possible contrast leak vs contrast within decompressed loop of small  bowel  - SBFT 8/28 without contrast extravasation  - CT 8/31 - same to slightly improved.  - tolerating Reg diet - CT scan 06/05/24 - significant improvement in duodenal inflammation; no evidence of abscess or free air. - continue abx - pt remains hemodynamically stable, WBC WNL  -  hgb 8.8     FEN: Reg diet,IVF per TRH; monitor ileostomy output - improved today, 750 mL. Continue fiber and iron , increase imodium  to 4 mg QID VTE: Eliquis  resumed 9/7, hgb stable ID: Zosyn  8/28>> ; appears to have source control at present; on abx based off of culture data from 4/30. Will discuss with MD but I think we can stop abx.    - per TRH -  Hx of PE/DVT Pulmonary HTN PAD HTN GERD - PPI Gout   LOS: 12 days    Almarie GORMAN Pringle 06/09/2024

## 2024-06-09 NOTE — NC FL2 (Signed)
 [place]  MEDICAID FL2 LEVEL OF CARE FORM     IDENTIFICATION  Patient Name: Alexander [name] Birthdate: 10/05/1960 Sex: male Admission Date (Current Location): 05/27/2024  [facility] and [place] Number:  Producer, television/film/video and Address:  [facility] [place], [address] [place], [place] [address]      Provider Number: [id number]  Attending Physician Name and Address:  Elgergawy, Brayton [name], MD  Relative Name and Phone Number:       Current Level of Care: Hospital Recommended Level of Care: Skilled Nursing Facility Prior Approval Number:    Date Approved/Denied:   PASRR Number: [id number]  Discharge Plan: SNF    Current Diagnoses: Patient Active Problem List   Diagnosis Date Noted   Intra-abdominal free air of unknown etiology 05/28/2024   Nausea and vomiting 05/28/2024   QT prolongation 05/28/2024   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   Adjustment disorder with mixed anxiety and depressed mood 05/26/2024   Malnutrition of moderate degree 04/23/2024   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   Enteritis 04/12/2024   History of partial colectomy 04/11/2024   Generalized abdominal pain 04/11/2024   Colon cancer screening 04/11/2024   Abdominal wall dehiscence 04/09/2024   Perioperative dehiscence of abdominal wound with evisceration 04/03/2024   Ileostomy in place [facility]) 03/30/2024   Ileostomy care (HCC) 03/20/2024   Irritant contact dermatitis associated with fecal stoma 03/20/2024   Spontaneous perforation of colon (HCC) 01/15/2024   Ascites 01/15/2024   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 01/15/2024   Acute blood loss anemia 01/14/2024   Sepsis (HCC) 01/11/2024   AKI (acute kidney injury) (HCC) 01/11/2024   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   Thrombocytopenia (HCC) 07/13/2023   Pulmonary hypertension (HCC) 07/12/2023   Cor pulmonale (HCC) 07/12/2023   Noncompliance 01/22/2020   High risk medication use 10/30/2019    Hyperlipidemia 10/30/2019   Hepatic steatosis 10/08/2019   Spinal stenosis 10/08/2019   Aortic atherosclerosis (HCC) 10/08/2019   Peripheral arterial disease (HCC) 10/08/2019   Sleep disturbance 10/08/2019   Diverticulosis 10/08/2019   Essential hypertension, benign 09/28/2019   Gout 09/28/2019   History of pulmonary embolus (PE) 04/20/2019   Chronic pain of right ankle 12/12/2016   Chronic foot pain, right 11/02/2012    Orientation RESPIRATION BLADDER Height & Weight     Self, Time, Situation, Place  Normal Continent Weight: 150 lb 5.7 oz (68.2 kg) Height:  5' 9 (175.3 cm)  BEHAVIORAL SYMPTOMS/MOOD NEUROLOGICAL BOWEL NUTRITION STATUS      Ileostomy Diet (see dc summary)  AMBULATORY STATUS COMMUNICATION OF NEEDS Skin   Limited Assist Verbally Other (Comment) (surgical dehisced abdomen lower medial)                       Personal Care Assistance Level of Assistance  Bathing, Feeding, Dressing Bathing Assistance: Limited assistance Feeding assistance: Limited assistance Dressing Assistance: Limited assistance     Functional Limitations Info  Sight, Hearing, Speech Sight Info: Adequate Hearing Info: Adequate Speech Info: Adequate    SPECIAL CARE FACTORS FREQUENCY  PT (By licensed PT), OT (By licensed OT)     PT Frequency: 5x/week OT Frequency: 5x/week            Contractures Contractures Info: Not present    Additional Factors Info  Code Status, Allergies, Isolation Precautions Code Status Info: Full Allergies Info: NKA   Insulin  Sliding Scale Info: see dc summary Isolation Precautions Info: VRE; MRSA     Current  Medications (06/09/2024):  This is the current hospital active medication list Current Facility-Administered Medications  Medication Dose Route Frequency Provider Last Rate Last Admin   (feeding supplement) PROSource Plus liquid 30 mL  30 mL Oral BID AC & HS Elgergawy, Dawood S, MD   30 mL at 06/06/24 2010   acetaminophen  (TYLENOL ) tablet 650  mg  650 mg Oral Q6H PRN Singh, Prashant K, MD   650 mg at 06/09/24 9386   albuterol  (PROVENTIL ) (2.5 MG/3ML) 0.083% nebulizer solution 2.5 mg  2.5 mg Nebulization Q4H PRN Segars, Jonathan, MD       apixaban  (ELIQUIS ) tablet 5 mg  5 mg Oral BID Elgergawy, Dawood S, MD   5 mg at 06/08/24 2129   Chlorhexidine  Gluconate Cloth 2 % PADS 6 each  6 each Topical Daily Singh, Prashant K, MD   6 each at 06/08/24 9077   diphenhydrAMINE  (BENADRYL ) capsule 25 mg  25 mg Oral Q8H PRN Elgergawy, Dawood S, MD   25 mg at 06/09/24 9386   ferrous sulfate  tablet 325 mg  325 mg Oral Q breakfast Augustus Almarie [name], PA-C   325 mg at 06/08/24 9078   gabapentin  (NEURONTIN ) capsule 200 mg  200 mg Oral TID Singh, Prashant K, MD   200 mg at 06/08/24 2129   linezolid  (ZYVOX ) IVPB 600 mg  600 mg Intravenous Q12H Singh, Prashant K, MD   Stopped at 06/08/24 2245   loperamide  (IMODIUM ) capsule 4 mg  4 mg Oral QID Augustus Almarie [name], PA-C   4 mg at 06/08/24 2129   magic mouthwash  5 mL Oral QID Elgergawy, Dawood S, MD   5 mL at 06/05/24 2209   methocarbamol  (ROBAXIN ) tablet 500 mg  500 mg Oral TID Simaan, Elizabeth S, PA-C   500 mg at 06/08/24 2130   morphine  (PF) 4 MG/ML injection 3 mg  3 mg Intravenous Q4H PRN Segars, Jonathan, MD   3 mg at 06/08/24 2133   Or   morphine  (PF) 4 MG/ML injection 6 mg  6 mg Intravenous Q4H PRN Keturah Carrier, MD   6 mg at 06/09/24 9385   Oral care mouth rinse  15 mL Mouth Rinse PRN Elgergawy, Brayton [name], MD       oxyCODONE  (Oxy IR/ROXICODONE ) immediate release tablet 5 mg  5 mg Oral Q4H PRN Simaan, Elizabeth S, PA-C   5 mg at 06/09/24 9960   pantoprazole  (PROTONIX ) injection 40 mg  40 mg Intravenous Q12H Segars, Jonathan, MD   40 mg at 06/08/24 2131   piperacillin -tazobactam (ZOSYN ) IVPB 3.375 g  3.375 g Intravenous Q8H Segars, Carrier, MD 12.5 mL/hr at 06/09/24 0600 Infusion Verify at 06/09/24 0600   polycarbophil (FIBERCON) tablet 625 mg  625 mg Oral Daily Simaan, Elizabeth S, PA-C   625 mg at  06/08/24 9077   sodium chloride  flush (NS) 0.9 % injection 10-40 mL  10-40 mL Intracatheter Q12H Singh, Prashant K, MD   10 mL at 06/08/24 2144   sodium chloride  flush (NS) 0.9 % injection 10-40 mL  10-40 mL Intracatheter PRN Singh, Prashant K, MD       sodium chloride  flush (NS) 0.9 % injection 3 mL  3 mL Intravenous Q12H Segars, Jonathan, MD   3 mL at 06/08/24 2146   traZODone  (DESYREL ) tablet 100 mg  100 mg Oral QHS Singh, Prashant K, MD   100 mg at 06/08/24 2130   trimethobenzamide  (TIGAN ) injection 200 mg  200 mg Intramuscular Q8H PRN Keturah Carrier, MD  Discharge Medications: Please see discharge summary for a list of discharge medications.  Relevant Imaging Results:  Relevant Lab Results:   Additional Information SSN: [id number]  Inocente [name] Jariana Shumard, LCSW

## 2024-06-09 NOTE — Progress Notes (Signed)
 Per recent hospital notes, pt has been transitioned to Eliquis .

## 2024-06-09 NOTE — Progress Notes (Signed)
 PROGRESS NOTE                                                                                                                                                                                                             Patient Demographics:    Alexander [name], is a 63 y.o. male, DOB - 1961/05/27, FMW:[id number]  Outpatient Primary MD for the patient is [name], [name]    LOS - 12  Admit date - 05/27/2024    Chief Complaint  Patient presents with   Abdominal Pain   Nausea   Weakness   Emesis       Brief Narrative (HPI from H&P)     63 y.o. 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 Presented with acute onset of N/V, abd pain, imaging was suggestive of some free air in the abdomen which probably contained perforation, seen by general surgery and admitted to the hospital.   Subjective:   Reports some abdominal cramping, but good oral intake tolerating diet.   Assessment  & Plan :   Sepsis, nausea vomiting, secondary to possible contained intra-abdominal perforation.  Patient with history of multiple abdominal surgeries and recent intra-abdominal abscess, has ileostomy.     -He has been seen by general surgery, he has  complicated history of multiple abdominal surgeries, recent intra-abdominal abscess, ileostomy.  There is suspicion of contained perforation, he has been treated conservatively with bowel rest, IV fluids, TNA, pain control and antibiotics.  Will defer management of this issue to general surgery who is following the patient.  - Tolerating soft diet, TPN to be tapered off 9/8 -Continue with IV antibiotics, will await from general surgery further recommendation regarding stop date -CT scan obtained 9/5 showing improvement, continue with current antibiotics per general surgery recommendations. - Per general  surgery can resume oral anticoagulation, please see discussion below.  Now on Eliquis . - Started on scheduled Imodium  per general surgery for increased ileostomy output.  Ileostomy output has improved with iron , fibers and Imodium  4 times daily  Prolonged Qtc  Avoid QT prolonging medications, replace electrolytes, monitor on telemetry.   Incidental rhinovirus URI.  Asymptomatic.  Monitor.    Hypomagnesemia and hypokalemia.  Replaced.    Chronic medical problems: Reintroduce oral medications as he is more consistent with oral diet.  Complex Surgical Hx per above, noted   DVT/PE:  - He is on warfarin at home due to cost  - On heparin  gtt. on admission once INR less than 2, for possible need for surgery.   - No intervention planned by general surgery, cleared to start anticoagulation, pharmacy has checked benefits and co-pay for Eliquis  or Xarelto  0, so patient is started on Xarelto .    Diastolic HF: Without exacerbation, dry clinically.   HTN: not on antiHTN at time of admission   Anemia: - Received 1 unit PRBC 9/5, hemoglobin stable  Chronic pain: Home regimen is Dilaudid  2 mg PO q 4 hr prn, see acute pain management above. Holding home Gabapentin .   ?insomnia/mood: holding trazodone    ?? ASCVD: Hold aspirin ; unclear indication since on Warfarin per above      Condition - Extremely  Guarded  Family Communication  : None present  Code Status : Full code  Consults  : General surgery  PUD Prophylaxis : PPI   Procedures  :     Small bowel follow-through 05/28/2024.  No extravasation of contrast.    CT 05/28/2024- 1. Findings which remain concerning for the presence of contained free air within the mid right abdomen and right upper quadrant, as described above. 2. Markedly inflamed, partially opacified small bowel loops within the mid and upper right abdomen. 3. Curvilinear area of oral contrast within the lateral aspect of the mid to lower right abdomen which may be within a decompressed small bowel loop, however, intraluminal location cannot completely be confirmed. 4. Stable moderate to marked severity areas of atelectasis and/or infiltrate within the bilateral lower lobes. 5. Colonic diverticulosis. 6. Aortic atherosclerosis.      Disposition Plan  :    Status is: Inpatient  DVT Prophylaxis  :  Hep gtt  SCDs Start: 05/28/24 0530 apixaban  (ELIQUIS ) tablet 5 mg    Lab Results  Component Value Date   PLT 335 06/09/2024    Diet :  Diet Order             Diet regular Fluid consistency: Thin  Diet effective now                    Inpatient Medications  Scheduled Meds:  (feeding supplement) PROSource Plus  30 mL Oral BID AC & HS   apixaban   5 mg Oral BID   Chlorhexidine  Gluconate Cloth  6 each Topical Daily   ferrous sulfate   325 mg Oral Q breakfast   gabapentin   200 mg Oral TID   loperamide   4 mg Oral QID   magic mouthwash  5 mL Oral QID   methocarbamol   500 mg Oral TID   pantoprazole  (PROTONIX ) IV  40 mg Intravenous Q12H   polycarbophil  625 mg Oral Daily   sodium chloride  flush  10-40 mL Intracatheter Q12H   sodium chloride  flush  3 mL Intravenous Q12H   traZODone   100 mg Oral QHS   Continuous Infusions:  linezolid  (  ZYVOX ) IV 600 mg (06/09/24 1045)   piperacillin -tazobactam (ZOSYN )  IV 3.375 g (06/09/24 1310)   PRN Meds:.acetaminophen ,  albuterol , diphenhydrAMINE , morphine  injection **OR** morphine  injection, mouth rinse, oxyCODONE , sodium chloride  flush, trimethobenzamide     Objective:   Vitals:   06/09/24 0133 06/09/24 0500 06/09/24 0856 06/09/24 1242  BP:   125/74 (!) 104/58  Pulse: 84     Resp: 16     Temp: 98.7 F (37.1 C)  98.6 F (37 C) 98.5 F (36.9 C)  TempSrc: Oral  Oral   SpO2: 94%     Weight:  68.2 kg    Height:        Wt Readings from Last 3 Encounters:  06/09/24 68.2 kg  04/29/24 55 kg  04/06/24 71 kg     Intake/Output Summary (Last 24 hours) at 06/09/2024 1412 Last data filed at 06/09/2024 1247 Gross per 24 hour  Intake 2132.71 ml  Output 1925 ml  Net 207.71 ml     Physical Exam  Awake Alert, Oriented X 3, No new F.N deficits, Normal affect Symmetrical Chest wall movement, Good air movement bilaterally, CTAB RRR,No Gallops,Rubs or new Murmurs, No Parasternal Heave +ve B.Sounds, Abd Soft, ileostomy present, abdominal wound bandaged No edema         Data Review:    Recent Labs  Lab 06/03/24 0331 06/04/24 0927 06/05/24 0241 06/05/24 1833 06/06/24 0017 06/07/24 0422 06/08/24 0306 06/09/24 0500  WBC 6.1   < > 6.5  --  8.4 6.2 6.8 8.3  HGB 7.6*   < > 7.2* 8.8* 8.8* 8.8* 9.0* 8.9*  HCT 24.6*   < > 24.0* 28.4* 28.6* 28.2* 29.1* 27.9*  PLT 308   < > 380  --  370 363 354 335  MCV 96.5   < > 100.4*  --  96.9 95.6 96.4 95.5  MCH 29.8   < > 30.1  --  29.8 29.8 29.8 30.5  MCHC 30.9   < > 30.0  --  30.8 31.2 30.9 31.9  RDW 16.6*   < > 17.2*  --  18.7* 17.8* 17.4* 17.2*  LYMPHSABS 1.4  --   --   --   --   --   --   --   MONOABS 0.5  --   --   --   --   --   --   --   EOSABS 0.0  --   --   --   --   --   --   --   BASOSABS 0.0  --   --   --   --   --   --   --    < > = values in this interval not displayed.    Recent Labs  Lab 06/03/24 0331 06/04/24 0038 06/05/24 0241 06/06/24 0017 06/07/24 0422 06/08/24 0306 06/09/24 0500  NA 139 138 138 135 136 132* 132*  K 4.3 4.5 4.5  4.3 4.5 4.0 4.1  CL 104 105 103 100 99 99 99  CO2 25 23 25 25 26 26 25   ANIONGAP 10 10 10 10 11 7 8   GLUCOSE 119* 131* 120* 109* 104* 121* 88  BUN 8 10 12 12 12 12 8   CREATININE 0.70 0.80 0.81 0.82 0.81 0.85 0.86  AST 14* 18  --   --   --  16  --   ALT 11 13  --   --   --  13  --   ALKPHOS 48 47  --   --   --  45  --   BILITOT 0.3 0.3  --   --   --  0.3  --   ALBUMIN  2.3* 2.4*  --   --   --  2.5*  --   PROCALCITON 0.15  --   --   --   --   --   --   MG  --  1.8 1.8 2.3  --  1.6*  --   PHOS  --  3.4  --  5.1* 6.3* 5.6*  --   CALCIUM  8.9 9.0 9.1 9.1 9.4 9.4 9.1      Recent Labs  Lab 06/03/24 0331 06/04/24 0038 06/05/24 0241 06/06/24 0017 06/07/24 0422 06/08/24 0306 06/09/24 0500  PROCALCITON 0.15  --   --   --   --   --   --   MG  --  1.8 1.8 2.3  --  1.6*  --   CALCIUM  8.9 9.0 9.1 9.1 9.4 9.4 9.1    --------------------------------------------------------------------------------------------------------------- Lab Results  Component Value Date   CHOL 114 12/03/2023   HDL 43.40 12/03/2023   LDLCALC 58 12/03/2023   TRIG 47 06/08/2024   CHOLHDL 3 12/03/2023    Lab Results  Component Value Date   HGBA1C 5.9 12/03/2023   No results for input(s): TSH, T4TOTAL, FREET4, T3FREE, THYROIDAB in the last 72 hours. No results for input(s): VITAMINB12, FOLATE, FERRITIN, TIBC, IRON , RETICCTPCT in the last 72 hours. ------------------------------------------------------------------------------------------------------------------ Cardiac Enzymes No results for input(s): CKMB, TROPONINI, MYOGLOBIN in the last 168 hours.  Invalid input(s): CK  Micro Results No results found for this or any previous visit (from the past 240 hours).   Radiology Report No results found.     Signature  -   Brayton Lye M.D on 06/09/2024 at 2:12 PM   -  To page go to [email or web address]

## 2024-06-10 DIAGNOSIS — R9431 Abnormal electrocardiogram [ECG] [EKG]: Secondary | ICD-10-CM | POA: Diagnosis not present

## 2024-06-10 DIAGNOSIS — A419 Sepsis, unspecified organism: Secondary | ICD-10-CM

## 2024-06-10 DIAGNOSIS — K668 Other specified disorders of peritoneum: Secondary | ICD-10-CM | POA: Diagnosis not present

## 2024-06-10 DIAGNOSIS — R112 Nausea with vomiting, unspecified: Secondary | ICD-10-CM | POA: Diagnosis not present

## 2024-06-10 MED ORDER — PANTOPRAZOLE SODIUM 40 MG PO TBEC
40.0000 mg | DELAYED_RELEASE_TABLET | Freq: Two times a day (BID) | ORAL | Status: DC
  Administered 2024-06-10: 40 mg via ORAL
  Filled 2024-06-10 (×2): qty 1

## 2024-06-10 MED ORDER — FERROUS SULFATE 325 (65 FE) MG PO TABS
325.0000 mg | ORAL_TABLET | Freq: Two times a day (BID) | ORAL | Status: DC
  Administered 2024-06-10: 325 mg via ORAL
  Filled 2024-06-10 (×2): qty 1

## 2024-06-10 MED ORDER — CALCIUM POLYCARBOPHIL 625 MG PO TABS
625.0000 mg | ORAL_TABLET | Freq: Two times a day (BID) | ORAL | Status: DC
  Administered 2024-06-10 – 2024-06-11 (×3): 625 mg via ORAL
  Filled 2024-06-10 (×4): qty 1

## 2024-06-10 NOTE — Progress Notes (Signed)
 PROGRESS NOTE        PATIENT DETAILS Name: Alexander [name] Age: 63 y.o. Sex: male Date of Birth: April 09, 1961 Admit Date: 05/27/2024 Admitting Physician Dorn Dawson, MD ERE:Tnmozb, Lucie, [name]  Brief Summary: Patient is a 63 y.o.  male with a complicated surgical history of colonic perforation (March 2025) secondary to diverticulitis-requiring emergent exploratory laparotomy-small bowel resection/ileostomy-postoperative course complicated by cardiac arrest/AKI requiring CRRT, intra-abdominal abscesses requiring drainage-stabilized and discharged to LTAC-readmitted July 2025 with wound dehiscence-underwent exploratory laparotomy with sigmoid colectomy/ileocolonic anastomosis and creation of loop ostomy-with a chronic dehisced abdominal wall wound-receiving wound care at SNF-presented with nausea/vomiting/abdominal pain-found to have a contained intra-abdominal perforation.  Significant events: 8/27>> admit to [facility]  Significant studies: 8/27>> CT abdomen/pelvis: Marked severe enteritis involving short segment of proximal duodenum and numerous small bowel loops, right upper quadrant free air consistent with associated bowel perforation. 8/28>> CT abdomen/pelvis: Findings concerning for presence of contained free air within the right mid abdomen/right upper abdominal quadrant.  Markedly inflamed-small bowel loops 8/31>> CT abdomen/pelvis: Extensive edema/inflammatory changes involving descending and transverse duodenum-similar to prior exam 9/05>> CT abdomen/pelvis: Improved inflammatory changes involving proximal duodenum  Significant microbiology data: 8/27>> blood cultures: No growth 8/27>> COVID/influenza/RSV PCR: Negative 8/28>> respiratory virus panel: +ve Rhino virus  Procedures: None  Consults: General surgery  Subjective: Somewhat upset and irritable this morning because nursing staff has not given him his medications on time.  Otherwise no major  issues overnight.  Objective: Vitals: Blood pressure 120/68, pulse 75, temperature 98.2 F (36.8 C), temperature source Oral, resp. rate 17, height 5' 9 (1.753 m), weight 68.2 kg, SpO2 90%.   Exam: Gen Exam:Alert awake-not in any distress HEENT:atraumatic, normocephalic Chest: B/L clear to auscultation anteriorly CVS:S1S2 regular Abdomen: Soft-brown stools in ostomy-midline dressing in place did not open. Extremities:no edema Neurology: Non focal Skin: no rash  Pertinent Labs/Radiology:    Latest Ref Rng & Units 06/11/2024    4:15 AM 06/09/2024    5:00 AM 06/08/2024    3:06 AM  CBC  WBC 4.0 - 10.5 K/uL 5.9  8.3  6.8   Hemoglobin 13.0 - 17.0 g/dL 9.0  8.9  9.0   Hematocrit 39.0 - 52.0 % 28.6  27.9  29.1   Platelets 150 - 400 K/uL 306  335  354     Lab Results  Component Value Date   NA 135 06/11/2024   K 3.9 06/11/2024   CL 100 06/11/2024   CO2 24 06/11/2024      Assessment/Plan: Sepsis secondary to small bowel inflammation/enteritis with possible contained perforation Sepsis physiology has resolved Clinically improved No longer on TNA Tolerating diet Stool present in ostomy Antibiotics being stopped by general surgery today Follow closely.  Increased ostomy output Watch closely but seems to have stabilized Continue scheduled Imodium   Rhinovirus infection Incidental/asymptomatic Supportive care  History of VTE Continue Eliquis  (previously on Coumadin  due to financial issues-per prior notes-his co-pay now is 0.)  Chronic HFpEF Euvolemic  Prolonged QTc Repeat twelve-lead EKG tomorrow morning Keep K> 4, Mg> 2,  telemetry monitoring Avoid QTc prolonging agents  Normocytic anemia Secondary to critical illness/acute illness Hb stable but did require 1 unit of PRBC on 9/5  Chronic pain Continue narcotic regimen Continue Neurontin .  Nutrition Status: Nutrition Problem: Severe Malnutrition Etiology: acute illness Signs/Symptoms: severe muscle  depletion, moderate fat depletion Interventions: Liberalize Diet, Magic  cup, MVI  Code status:   Code Status: Full Code   DVT Prophylaxis: SCDs Start: 05/28/24 0530 apixaban  (ELIQUIS ) tablet 5 mg    Family Communication: None at bedside   Disposition Plan: Status is: Inpatient Remains inpatient appropriate because: Severity of illness   Planned Discharge Destination: SNF   Diet: Diet Order             Diet regular Fluid consistency: Thin  Diet effective now                     Antimicrobial agents: Anti-infectives (From admission, onward)    Start     Dose/Rate Route Frequency Ordered Stop   05/29/24 1330  linezolid  (ZYVOX ) IVPB 600 mg  Status:  Discontinued        600 mg 300 mL/hr over 60 Minutes Intravenous Every 12 hours 05/29/24 1230 06/10/24 1145   05/28/24 2000  piperacillin -tazobactam (ZOSYN ) IVPB 3.375 g  Status:  Discontinued        3.375 g 12.5 mL/hr over 240 Minutes Intravenous Every 8 hours 05/28/24 0534 06/10/24 1037   05/28/24 0900  metroNIDAZOLE  (FLAGYL ) IVPB 500 mg        500 mg 100 mL/hr over 60 Minutes Intravenous  Once 05/28/24 0534 05/28/24 1104   05/28/24 0630  DAPTOmycin  (CUBICIN ) IVPB 700 mg/126mL premix        10 mg/kg  68 kg 200 mL/hr over 30 Minutes Intravenous  Once 05/28/24 0627 05/28/24 0754   05/27/24 1945  cefTRIAXone  (ROCEPHIN ) 2 g in sodium chloride  0.9 % 100 mL IVPB        2 g 200 mL/hr over 30 Minutes Intravenous Once 05/27/24 1934 05/27/24 2125   05/27/24 1945  metroNIDAZOLE  (FLAGYL ) IVPB 500 mg        500 mg 100 mL/hr over 60 Minutes Intravenous  Once 05/27/24 1934 05/27/24 2236        MEDICATIONS: Scheduled Meds:  apixaban   5 mg Oral BID   Chlorhexidine  Gluconate Cloth  6 each Topical Daily   ferrous sulfate   325 mg Oral BID WC   gabapentin   200 mg Oral TID   loperamide   4 mg Oral QID   magic mouthwash  5 mL Oral QID   methocarbamol   500 mg Oral TID   pantoprazole   40 mg Oral BID   polycarbophil  625 mg Oral  BID   sodium chloride  flush  10-40 mL Intracatheter Q12H   sodium chloride  flush  3 mL Intravenous Q12H   traZODone   100 mg Oral QHS   Continuous Infusions: PRN Meds:.acetaminophen , albuterol , diphenhydrAMINE , morphine  injection **OR** morphine  injection, mouth rinse, oxyCODONE , sodium chloride  flush, trimethobenzamide    I have personally reviewed following labs and imaging studies  LABORATORY DATA: CBC: Recent Labs  Lab 06/06/24 0017 06/07/24 0422 06/08/24 0306 06/09/24 0500 06/11/24 0415  WBC 8.4 6.2 6.8 8.3 5.9  HGB 8.8* 8.8* 9.0* 8.9* 9.0*  HCT 28.6* 28.2* 29.1* 27.9* 28.6*  MCV 96.9 95.6 96.4 95.5 96.0  PLT 370 363 354 335 306    Basic Metabolic Panel: Recent Labs  Lab 06/05/24 0241 06/06/24 0017 06/07/24 0422 06/08/24 0306 06/09/24 0500 06/11/24 0415  NA 138 135 136 132* 132* 135  K 4.5 4.3 4.5 4.0 4.1 3.9  CL 103 100 99 99 99 100  CO2 25 25 26 26 25 24   GLUCOSE 120* 109* 104* 121* 88 72  BUN 12 12 12 12 8 9   CREATININE 0.81 0.82 0.81 0.85 0.86 0.90  CALCIUM  9.1 9.1 9.4 9.4 9.1 9.7  MG 1.8 2.3  --  1.6*  --   --   PHOS  --  5.1* 6.3* 5.6*  --   --     GFR: Estimated Creatinine Clearance: 82.1 mL/min (by C-G formula based on SCr of 0.9 mg/dL).  Liver Function Tests: Recent Labs  Lab 06/08/24 0306  AST 16  ALT 13  ALKPHOS 45  BILITOT 0.3  PROT 7.6  ALBUMIN  2.5*   No results for input(s): LIPASE, AMYLASE in the last 168 hours. No results for input(s): AMMONIA in the last 168 hours.  Coagulation Profile: No results for input(s): INR, PROTIME in the last 168 hours.  Cardiac Enzymes: No results for input(s): CKTOTAL, CKMB, CKMBINDEX, TROPONINI in the last 168 hours.  BNP (last 3 results) No results for input(s): PROBNP in the last 8760 hours.  Lipid Profile: No results for input(s): CHOL, HDL, LDLCALC, TRIG, CHOLHDL, LDLDIRECT in the last 72 hours.   Thyroid Function Tests: No results for input(s): TSH,  T4TOTAL, FREET4, T3FREE, THYROIDAB in the last 72 hours.  Anemia Panel: No results for input(s): VITAMINB12, FOLATE, FERRITIN, TIBC, IRON , RETICCTPCT in the last 72 hours.  Urine analysis:    Component Value Date/Time   COLORURINE YELLOW 05/27/2024 2300   APPEARANCEUR CLEAR 05/27/2024 2300   LABSPEC 1.045 (H) 05/27/2024 2300   LABSPEC 1.015 06/14/2017 0945   PHURINE 5.0 05/27/2024 2300   GLUCOSEU NEGATIVE 05/27/2024 2300   HGBUR NEGATIVE 05/27/2024 2300   BILIRUBINUR NEGATIVE 05/27/2024 2300   BILIRUBINUR Negative 08/08/2020 1205   KETONESUR NEGATIVE 05/27/2024 2300   PROTEINUR NEGATIVE 05/27/2024 2300   UROBILINOGEN 0.2 08/08/2020 1205   UROBILINOGEN 1.0 09/17/2011 0958   NITRITE NEGATIVE 05/27/2024 2300   LEUKOCYTESUR NEGATIVE 05/27/2024 2300    Sepsis Labs: Lactic Acid, Venous    Component Value Date/Time   LATICACIDVEN 1.4 05/28/2024 0612    MICROBIOLOGY: No results found for this or any previous visit (from the past 240 hours).  RADIOLOGY STUDIES/RESULTS: No results found.   LOS: 14 days   Donalda Applebaum, MD  [facility]    To contact the attending provider between 7A-7P or the covering provider during after hours 7P-7A, please log into the web site [email or web address] and access using universal [place] password for that web site. If you do not have the password, please call the hospital operator.  06/11/2024, 7:23 AM

## 2024-06-10 NOTE — TOC Progression Note (Addendum)
 Transition of Care [facility]) - Progression Note    Patient Details  Name: Alexander [name] MRN: [id number] Date of Birth: 11/28/1960  Transition [facility] Care [facility] CM/SW Contact  Inocente GORMAN Kindle, LCSW Phone Number: 06/10/2024, 9:47 AM  Clinical Narrative:    9:47am-CSW updated [facility] that patient is ready for discharge pending insurance approval. CSW initiated insurance process, Certification Number [phone] [id number].  5:03 PM-Insurance approval received until 06/16/24.   Expected Discharge Plan: Skilled Nursing Facility Barriers to Discharge: Continued Medical Work up, English as a second language teacher               Expected Discharge Plan and Services In-house Referral: Clinical Social Work   Post Acute Care Choice: Skilled Nursing Facility Living arrangements for the past 2 months: Single Family Home, Skilled Nursing Facility                                       Social Drivers of Health (SDOH) Interventions SDOH Screenings   Food Insecurity: Patient Unable To Answer (05/28/2024)  Housing: Unknown (05/28/2024)  Recent Concern: Housing - High Risk (04/10/2024)  Transportation Needs: Patient Unable To Answer (05/28/2024)  Recent Concern: Transportation Needs - Unmet Transportation Needs (03/30/2024)  Utilities: Not At Risk (05/28/2024)  Depression (PHQ2-9): Low Risk  (07/24/2023)  Financial Resource Strain: Low Risk  (05/01/2024)   Received from [facility]  Social Connections: Moderately Isolated (05/01/2024)   Received from [facility]  Stress: No Stress Concern Present (05/13/2024)   Received from [facility]  Tobacco Use: Medium Risk (05/27/2024)    Readmission Risk Interventions    05/28/2024    2:04 PM 04/30/2024   12:54 PM 04/10/2024    1:55 PM  Readmission Risk Prevention Plan  Transportation Screening Complete Complete Complete  Medication Review [employer]) Complete Complete Complete  PCP or Specialist appointment within 3-5 days of discharge Complete Complete  Complete  HRI or Home Care Consult Complete Complete Complete  SW Recovery Care/Counseling Consult Complete Complete Complete  Palliative Care Screening Not Applicable Not Applicable Not Applicable  Skilled Nursing Facility Complete Not Applicable Not Applicable

## 2024-06-10 NOTE — Plan of Care (Signed)

## 2024-06-10 NOTE — Progress Notes (Addendum)
 Nutrition Follow-up  DOCUMENTATION CODES:   Severe malnutrition in context of acute illness/injury  INTERVENTION:  Encourage adequate PO intake to promote wound healing  Continue Magic cup TID with meals, each supplement provides 290 kcal and 9 grams of protein   Continue MVI w/ minerals   NUTRITION DIAGNOSIS:  Severe Malnutrition related to acute illness as evidenced by severe muscle depletion, moderate fat depletion. - new dx established s/p NFPE  GOAL:  Patient will meet greater than or equal to 90% of their needs - progressing  MONITOR:  PO intake, Supplement acceptance, Skin, I & O's  REASON FOR ASSESSMENT:  Consult New TPN/TNA  ASSESSMENT:   64 y.o. male presented to the ED with abdominal pain, nausea and vomiting. Pt recently admitted in July 2025 and underwent abdominal surgery with ileostomy creation. PMH includes GERD, HTN, gout, and diverticulitis. Pt admitted with sepsis due to intraabdominal infection vs possible perforation.  8/28 - Admitted; strict NPO per surgery recommendation 8/30 - TPN initiated (70% of estimated needs) 8/31 - TPN up to goal 9/03 - calorie count initiated  9/05 - calorie count over - inadequate intake; 1 unit PRBCs transfused; diet advanced to regular  9/08 - TPN off  Medically he is stabilizing. Surgery opting to stop IV ABX today. Continue to watch ostomy output.   Average Meal Intake 9/5: 75% x1 documented meal 9/6: 95-100% x2 documented meals 9/7: 75-100% x2 documented meals  Tolerating regular diet. Intake increased significantly with change in diet order. Ostomy output did increase and is more liquid, however not excessive. Patient documented with frequent requests to have it change, however not because it full. Patient preference.   Admit Weight: 68 kg Current Weight: 63.6 kg   No edema documented or reported. As per last assessment, patient has shown significant weight loss over last year.  NFPE findings illustrate that  patient meets criteria for severe malnutrition in the context of acute illness. Significant weight loss over the last year signifies that likely he has underlying chronic contributors as well.   Drains/Lines: L basilic: PICC (placed 8/29) RUQ: ileostomy: 930 ml x24 hours UOP: 1500 ml x24 hours   Nutrition Related Medications: ferrous sulfate , loperamide , pantoprazole , polycarbophil    Labs stabilizing. Magnesium  repletion has been required recently. Remains on fiber and Imodium .   Labs from 9/09 reviewed: Sodium 136>132>132 (L) Potassium 4.1(wdl)  Magnesium  1.6 (9/08) PHOS 5.6 (9/08) CBGs 88-121 x24 hours A1c 5.9 (11/2023)   NUTRITION - FOCUSED PHYSICAL EXAM:  Flowsheet Row Most Recent Value  Orbital Region Moderate depletion  Upper Arm Region Mild depletion  Thoracic and Lumbar Region Mild depletion  Buccal Region Moderate depletion  Temple Region Moderate depletion  Clavicle Bone Region Moderate depletion  Clavicle and Acromion Bone Region Severe depletion  Scapular Bone Region Severe depletion  Dorsal Hand Moderate depletion  Patellar Region Mild depletion  Anterior Thigh Region Mild depletion  Posterior Calf Region Mild depletion  Edema (RD Assessment) None  Hair Reviewed  Eyes Reviewed  Mouth Reviewed  Skin Reviewed  Nails Reviewed    Diet Order:   Diet Order             Diet regular Fluid consistency: Thin  Diet effective now            EDUCATION NEEDS:  Not appropriate for education at this time  Skin:  Skin Assessment: Reviewed RN Assessment  Last BM:  9/3 - type 7 x2  Height:  Ht Readings from Last 1 Encounters:  05/27/24 5' 9 (1.753 m)   Weight:  Wt Readings from Last 1 Encounters:  06/09/24 68.2 kg   Ideal Body Weight:  72.7 kg  BMI:  Body mass index is 22.2 kg/m.  Estimated Nutritional Needs:   Kcal:  2100-2300  Protein:  105-125 grams  Fluid:  >/= 2 L  Blair Deaner MS, RD, LDN Registered Dietitian Clinical Nutrition RD  Inpatient Contact Info in Amion

## 2024-06-10 NOTE — Progress Notes (Signed)
 Pt ostomy output remains liquid. Pt calls for ostomy bag to be emptied frequently, regardless of amount. Inquired if pt was educated to 'burp' bag and empty ostomy as he would like it emptied quite frequently. Pt stated he would not manage ostomy, RN inquired if pt needed education on ostomy care for when he went home. Pt states that he will have a nurse at home and 'that's not my job, that's your job. Informed pt he needs to know how to care for his ostomy if he doesn't have an Char[employer] fundraiser 24/7 at home. Pt will not perform self care on ostomy at this time. Pt complains frequently of pain and requests pain medication on schedule. Pt educated on how often he may receive his pain medication and PRN's. Pt will ask for pain medicine an hour after receiving forgetting that he has already received pain medication. Pt frequently asks for next available pain medication or benadryl  and requests medication be brought as soon as it is available or earlier and to wake him. Pt informed he must call for PRN medication as it is not protocol to automatically medicate with narcotics and sedating medication. Pt highly irritable and verbally abusive using profanity and inappropriate comments regarding staff.

## 2024-06-10 NOTE — Plan of Care (Signed)
   Problem: Education: Goal: Knowledge of General Education information will improve Description: Including pain rating scale, medication(s)/side effects and non-pharmacologic comfort measures Outcome: Progressing   Problem: Health Behavior/Discharge Planning: Goal: Ability to manage health-related needs will improve Outcome: Progressing   Problem: Activity: Goal: Risk for activity intolerance will decrease Outcome: Progressing   Problem: Nutrition: Goal: Adequate nutrition will be maintained Outcome: Progressing   Problem: Coping: Goal: Level of anxiety will decrease Outcome: Progressing   Problem: Pain Managment: Goal: General experience of comfort will improve and/or be controlled Outcome: Progressing   Problem: Safety: Goal: Ability to remain free from injury will improve Outcome: Progressing   Problem: Skin Integrity: Goal: Risk for impaired skin integrity will decrease Outcome: Progressing

## 2024-06-10 NOTE — Progress Notes (Signed)
   Subjective/Chief Complaint: Pt complains of incisional pain Tolerating po Ostomy working well   Objective: Vital signs in last 24 hours: Temp:  [97.9 F (36.6 C)-98.5 F (36.9 C)] 97.9 F (36.6 C) (09/10 0818) Pulse Rate:  [83-85] 85 (09/10 0358) Resp:  [17-18] 18 (09/10 0358) BP: (91-107)/(53-60) 101/57 (09/10 0818) SpO2:  [95 %-97 %] 97 % (09/10 0358) Last BM Date : 06/09/24  Intake/Output from previous day: 09/09 0701 - 09/10 0700 In: 900 [P.O.:300; IV Piggyback:600] Out: 2730 [Urine:1500; Stool:1230] Intake/Output this shift: Total I/O In: -  Out: 50 [Stool:50]  Exam: Awake and alert Looks comfortable Abdomen soft, midline wound with excellent granulation tissue, ostomy working well  Lab Results:  Recent Labs    06/08/24 0306 06/09/24 0500  WBC 6.8 8.3  HGB 9.0* 8.9*  HCT 29.1* 27.9*  PLT 354 335   BMET Recent Labs    06/08/24 0306 06/09/24 0500  NA 132* 132*  K 4.0 4.1  CL 99 99  CO2 26 25  GLUCOSE 121* 88  BUN 12 8  CREATININE 0.85 0.86  CALCIUM  9.4 9.1   PT/INR No results for input(s): LABPROT, INR in the last 72 hours. ABG No results for input(s): PHART, HCO3 in the last 72 hours.  Invalid input(s): PCO2, PO2  Studies/Results: No results found.  Anti-infectives: Anti-infectives (From admission, onward)    Start     Dose/Rate Route Frequency Ordered Stop   05/29/24 1330  linezolid  (ZYVOX ) IVPB 600 mg        600 mg 300 mL/hr over 60 Minutes Intravenous Every 12 hours 05/29/24 1230     05/28/24 2000  piperacillin -tazobactam (ZOSYN ) IVPB 3.375 g        3.375 g 12.5 mL/hr over 240 Minutes Intravenous Every 8 hours 05/28/24 0534     05/28/24 0900  metroNIDAZOLE  (FLAGYL ) IVPB 500 mg        500 mg 100 mL/hr over 60 Minutes Intravenous  Once 05/28/24 0534 05/28/24 1104   05/28/24 0630  DAPTOmycin  (CUBICIN ) IVPB 700 mg/170mL premix        10 mg/kg  68 kg 200 mL/hr over 30 Minutes Intravenous  Once 05/28/24 9372 05/28/24  0754   05/27/24 1945  cefTRIAXone  (ROCEPHIN ) 2 g in sodium chloride  0.9 % 100 mL IVPB        2 g 200 mL/hr over 30 Minutes Intravenous Once 05/27/24 1934 05/27/24 2125   05/27/24 1945  metroNIDAZOLE  (FLAGYL ) IVPB 500 mg        500 mg 100 mL/hr over 60 Minutes Intravenous  Once 05/27/24 1934 05/27/24 2236       Assessment/Plan: 41M with complex medical and surgical history, most recent surgery 04/13/24 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 Tanda Regal bowel inflammation with concern for possible perforation - known hostile abdomen - nausea and vomiting, small bowel enteritis and small volume pneumoperitoneum in the RUQ on CT 8/27 - CT 8/28 with concern for inflamed small bowel loop on the right with concern for contained perforation but also possible contrast leak vs contrast within decompressed loop of small  bowel  - SBFT 8/28 without contrast extravasation  - CT 8/31 - same to slightly improved.  - tolerating Reg diet - CT scan 06/05/24 - significant improvement in duodenal inflammation; no evidence of abscess or free air.  -pt continues slow improvement -will stop IV antibiotics -wound care -watch ostomy output  Vicenta Poli MD 06/10/2024

## 2024-06-11 DIAGNOSIS — R52 Pain, unspecified: Secondary | ICD-10-CM | POA: Diagnosis not present

## 2024-06-11 DIAGNOSIS — K219 Gastro-esophageal reflux disease without esophagitis: Secondary | ICD-10-CM | POA: Diagnosis not present

## 2024-06-11 DIAGNOSIS — K5790 Diverticulosis of intestine, part unspecified, without perforation or abscess without bleeding: Secondary | ICD-10-CM | POA: Diagnosis not present

## 2024-06-11 DIAGNOSIS — I959 Hypotension, unspecified: Secondary | ICD-10-CM | POA: Diagnosis not present

## 2024-06-11 DIAGNOSIS — M6281 Muscle weakness (generalized): Secondary | ICD-10-CM | POA: Diagnosis not present

## 2024-06-11 DIAGNOSIS — R112 Nausea with vomiting, unspecified: Secondary | ICD-10-CM | POA: Diagnosis not present

## 2024-06-11 DIAGNOSIS — K529 Noninfective gastroenteritis and colitis, unspecified: Secondary | ICD-10-CM | POA: Diagnosis not present

## 2024-06-11 DIAGNOSIS — K668 Other specified disorders of peritoneum: Secondary | ICD-10-CM | POA: Diagnosis not present

## 2024-06-11 DIAGNOSIS — I10 Essential (primary) hypertension: Secondary | ICD-10-CM | POA: Diagnosis not present

## 2024-06-11 DIAGNOSIS — R2689 Other abnormalities of gait and mobility: Secondary | ICD-10-CM | POA: Diagnosis not present

## 2024-06-11 DIAGNOSIS — A419 Sepsis, unspecified organism: Secondary | ICD-10-CM | POA: Diagnosis not present

## 2024-06-11 DIAGNOSIS — Z7401 Bed confinement status: Secondary | ICD-10-CM | POA: Diagnosis not present

## 2024-06-11 DIAGNOSIS — D649 Anemia, unspecified: Secondary | ICD-10-CM | POA: Diagnosis not present

## 2024-06-11 DIAGNOSIS — E43 Unspecified severe protein-calorie malnutrition: Secondary | ICD-10-CM | POA: Insufficient documentation

## 2024-06-11 DIAGNOSIS — R531 Weakness: Secondary | ICD-10-CM | POA: Diagnosis not present

## 2024-06-11 LAB — BASIC METABOLIC PANEL WITH GFR
Anion gap: 11 (ref 5–15)
BUN: 9 mg/dL (ref 8–23)
CO2: 24 mmol/L (ref 22–32)
Calcium: 9.7 mg/dL (ref 8.9–10.3)
Chloride: 100 mmol/L (ref 98–111)
Creatinine, Ser: 0.9 mg/dL (ref 0.61–1.24)
GFR, Estimated: >60 mL/min (ref >60)
Glucose, Bld: 72 mg/dL (ref 70–99)
Potassium: 3.9 mmol/L (ref 3.5–5.1)
Sodium: 135 mmol/L (ref 135–145)

## 2024-06-11 LAB — CBC
HCT: 28.6 % — ABNORMAL LOW (ref 39.0–52.0)
Hemoglobin: 9 g/dL — ABNORMAL LOW (ref 13.0–17.0)
MCH: 30.2 pg (ref 26.0–34.0)
MCHC: 31.5 g/dL (ref 30.0–36.0)
MCV: 96 fL (ref 80.0–100.0)
Platelets: 306 K/uL (ref 150–400)
RBC: 2.98 MIL/uL — ABNORMAL LOW (ref 4.22–5.81)
RDW: 16.9 % — ABNORMAL HIGH (ref 11.5–15.5)
WBC: 5.9 K/uL (ref 4.0–10.5)
nRBC: 0 % (ref 0.0–0.2)

## 2024-06-11 MED ORDER — HYDROMORPHONE HCL 2 MG PO TABS
2.0000 mg | ORAL_TABLET | ORAL | Status: DC | PRN
  Administered 2024-06-11: 2 mg via ORAL
  Filled 2024-06-11: qty 1

## 2024-06-11 MED ORDER — LOPERAMIDE HCL 2 MG PO CAPS: 4.0000 mg | ORAL_CAPSULE | Freq: Every day | ORAL | Status: DC

## 2024-06-11 MED ORDER — MAGNESIUM SULFATE 2 GM/50ML IV SOLN
2.0000 g | Freq: Once | INTRAVENOUS | Status: AC
  Administered 2024-06-11: 2 g via INTRAVENOUS
  Filled 2024-06-11: qty 50

## 2024-06-11 MED ORDER — HYDROMORPHONE HCL 2 MG PO TABS: 2.0000 mg | ORAL_TABLET | ORAL | 0 refills | Status: DC | PRN

## 2024-06-11 MED ORDER — APIXABAN 5 MG PO TABS: 5.0000 mg | ORAL_TABLET | Freq: Two times a day (BID) | ORAL | Status: DC

## 2024-06-11 MED ORDER — NALOXONE HCL 4 MG/0.1ML NA LIQD
NASAL | Status: DC
Start: 2024-06-11 — End: 2024-08-19

## 2024-06-11 NOTE — Discharge Summary (Addendum)
 PATIENT DETAILS Name: Alexander [name] Age: 63 y.o. Sex: male Date of Birth: 27-Jul-1961 MRN: [id number]. Admitting Physician: Dorn Dawson, MD ERE:Tnmozb, Lucie, [name]  Admit Date: 05/27/2024 Discharge date: 06/11/2024  Recommendations for Outpatient Follow-up:  Follow up with PCP in 1-2 weeks Please obtain CMP/CBC in one week Please ensure follow-up with general surgery  Admitted From:  SNF  Disposition: Skilled nursing facility   Discharge Condition: good  CODE STATUS:   Code Status: Full Code   Diet recommendation:  Diet Order             Diet general           Diet regular Fluid consistency: Thin  Diet effective now                    Brief Summary: Patient is a 63 y.o.  male with a complicated surgical history of colonic perforation (March 2025) secondary to diverticulitis-requiring emergent exploratory laparotomy-small bowel resection/ileostomy-postoperative course complicated by cardiac arrest/AKI requiring CRRT, intra-abdominal abscesses requiring drainage-stabilized and discharged to LTAC-readmitted July 2025 with wound dehiscence-underwent exploratory laparotomy with sigmoid colectomy/ileocolonic anastomosis and creation of loop ostomy-with a chronic dehisced abdominal wall wound-receiving wound care at SNF-presented with nausea/vomiting/abdominal pain-found to have a contained intra-abdominal perforation.   Significant events: 8/27>> admit to [facility]   Significant studies: 8/27>> CT abdomen/pelvis: Marked severe enteritis involving short segment of proximal duodenum and numerous small bowel loops, right upper quadrant free air consistent with associated bowel perforation. 8/28>> CT abdomen/pelvis: Findings concerning for presence of contained free air within the right mid abdomen/right upper abdominal quadrant.  Markedly inflamed-small bowel loops 8/31>> CT abdomen/pelvis: Extensive edema/inflammatory changes involving descending and transverse  duodenum-similar to prior exam 9/05>> CT abdomen/pelvis: Improved inflammatory changes involving proximal duodenum   Significant microbiology data: 8/27>> blood cultures: No growth 8/27>> COVID/influenza/RSV PCR: Negative 8/28>> respiratory virus panel: +ve Rhino virus   Procedures: None   Consults: General surgery  Brief Hospital Course: Sepsis secondary to small bowel inflammation/enteritis with possible contained perforation Sepsis physiology has resolved Clinically improved No longer on TNA Tolerating diet Stool present in ostomy Antibiotics stopped on 9/10. Plan is to transition back to oral Dilaudid  on discharge.  Increased ostomy output Watch closely but seems to have stabilized Continue scheduled Imodium    Rhinovirus infection Incidental/asymptomatic Supportive care   History of VTE Continue Eliquis  (previously on Coumadin  due to financial issues-per prior notes-his co-pay now is 0.)   Chronic HFpEF Euvolemic   Prolonged QTc Resolved per EKG 9/11. Keep K> 4, Mg> 2   Normocytic anemia Secondary to critical illness/acute illness Hb stable but did require 1 unit of PRBC on 9/5   Chronic pain Continue Neurontin . See above regarding transitioning back to oral Dilaudid .   Nutrition Status: Nutrition Problem: Severe Malnutrition Etiology: acute illness Signs/Symptoms: severe muscle depletion, moderate fat depletion Interventions: Liberalize Diet, Magic cup, MVI   Discharge Diagnoses:  Principal Problem:   Intra-abdominal free air of unknown etiology Active Problems:   Sepsis (HCC)   Enteritis   Nausea and vomiting   QT prolongation   Protein-calorie malnutrition, severe   Discharge Instructions:  Activity:  As tolerated with Full fall precautions use walker/cane & assistance as needed  Discharge Instructions     Call MD for:  redness, tenderness, or signs of infection (pain, swelling, redness, odor or green/yellow discharge around incision  site)   Complete by: As directed    Diet general   Complete by: As directed  Discharge instructions   Complete by: As directed    Follow with Primary MD  [name], PA in 1-2 weeks  Please get a complete blood count and chemistry panel checked by your Primary MD at your next visit, and again as instructed by your Primary MD.  Get Medicines reviewed and adjusted: Please take all your medications with you for your next visit with your Primary MD  Laboratory/radiological data: Please request your Primary MD to go over all hospital tests and procedure/radiological results at the follow up, please ask your Primary MD to get all Hospital records sent to his/her office.  In some cases, they will be blood work, cultures and biopsy results pending at the time of your discharge. Please request that your primary care M.D. follows up on these results.  Also Note the following: If you experience worsening of your admission symptoms, develop shortness of breath, life threatening emergency, suicidal or homicidal thoughts you must seek medical attention immediately by calling 911 or calling your MD immediately  if symptoms less severe.  You must read complete instructions/literature along with all the possible adverse reactions/side effects for all the Medicines you take and that have been prescribed to you. Take any new Medicines after you have completely understood and accpet all the possible adverse reactions/side effects.   Do not drive when taking Pain medications or sleeping medications (Benzodaizepines)  Do not take more than prescribed Pain, Sleep and Anxiety Medications. It is not advisable to combine anxiety,sleep and pain medications without talking with your primary care practitioner  Special Instructions: If you have smoked or chewed Tobacco  in the last 2 yrs please stop smoking, stop any regular Alcohol  and or any Recreational drug use.  Wear Seat belts while driving.  Please  note: You were cared for by a hospitalist during your hospital stay. Once you are discharged, your primary care physician will handle any further medical issues. Please note that NO REFILLS for any discharge medications will be authorized once you are discharged, as it is imperative that you return to your primary care physician (or establish a relationship with a primary care physician if you do not have one) for your post hospital discharge needs so that they can reassess your need for medications and monitor your lab values.   Discharge wound care:   Complete by: As directed    Wound care  Until discontinued      Comments: Abd midline: Cleanse with saline, pat dry the peri-wound skin. Apply a moist Kerlix with gauze, cover with abd pad and hold with tape. Change daily or PRN   Increase activity slowly   Complete by: As directed       Allergies as of 06/11/2024   No Known Allergies      Medication List     STOP taking these medications    warfarin 5 MG tablet Commonly known as: COUMADIN        TAKE these medications    acetaminophen  325 MG tablet Commonly known as: TYLENOL  Take 650 mg by mouth every 6 (six) hours as needed for mild pain (pain score 1-3).   apixaban  5 MG Tabs tablet Commonly known as: ELIQUIS  Take 1 tablet (5 mg total) by mouth 2 (two) times daily.   ascorbic acid  500 MG tablet Commonly known as: VITAMIN C  Take 1 tablet (500 mg total) by mouth 2 (two) times daily.   Aspirin  81 81 MG tablet Generic drug: aspirin  EC Take 81 mg by mouth daily.  Swallow whole.   ferrous sulfate  325 (65 FE) MG EC tablet Take 1 tablet by mouth every morning.   gabapentin  100 MG capsule Commonly known as: NEURONTIN  Take 2 capsules (200 mg total) by mouth 3 (three) times daily.   guaiFENesin-dextromethorphan 100-10 MG/5ML syrup Commonly known as: ROBITUSSIN DM Take 10 mLs by mouth every 4 (four) hours as needed for cough.   HYDROmorphone  2 MG tablet Commonly known as:  DILAUDID  Take 1 tablet (2 mg total) by mouth every 4 (four) hours as needed for moderate pain (pain score 4-6).   loperamide  2 MG capsule Commonly known as: IMODIUM  Take 2 capsules (4 mg total) by mouth daily. What changed: how much to take   melatonin 3 MG Tabs tablet Take 6 mg by mouth at bedtime.   methocarbamol  500 MG tablet Commonly known as: ROBAXIN  Take 1 tablet (500 mg total) by mouth 3 (three) times daily.   multivitamin with minerals Tabs tablet Take 1 tablet by mouth daily.   naloxone  4 MG/0.1ML Liqd nasal spray kit Commonly known as: NARCAN  For opiate reversal as needed.   ondansetron  4 MG tablet Commonly known as: ZOFRAN  Take 4 mg by mouth every 6 (six) hours as needed for nausea or vomiting.   pantoprazole  40 MG tablet Commonly known as: PROTONIX  Take 1 tablet (40 mg total) by mouth daily.   psyllium 95 % Pack Commonly known as: HYDROCIL/METAMUCIL Take 1 packet by mouth daily.   sodium chloride  0.9 % infusion Inject 40 mLs into the vein continuous.   traZODone  100 MG tablet Commonly known as: DESYREL  Take 1 tablet (100 mg total) by mouth at bedtime.   zinc  sulfate (50mg  elemental zinc ) 220 (50 Zn) MG capsule Take 1 capsule (220 mg total) by mouth daily.               Discharge Care Instructions  (From admission, onward)           Start     Ordered   06/11/24 0000  Discharge wound care:       Comments: Wound care  Until discontinued      Comments: Abd midline: Cleanse with saline, pat dry the peri-wound skin. Apply a moist Kerlix with gauze, cover with abd pad and hold with tape. Change daily or PRN   06/11/24 1108            Contact information for follow-up providers     Tanda Locus, MD. Go on 07/09/2024.   Specialty: General Surgery Why: at 3:30 PM for post-operative follow up. please arrive 20-30 minutes early. Contact information: [address] [place] [address] [phone]         [name], [name]. Schedule an appointment as soon as possible for a visit in 1 week(s).   Specialty: Physician Assistant Contact information: [address] [place] [address] [phone]              Contact information for after-discharge care     Destination     [facility] .   Service: Skilled Nursing Contact information: [address] [place]  [phone]                    No Known Allergies   Other Procedures/Studies: CT ABDOMEN PELVIS W CONTRAST Result Date: 06/05/2024 CLINICAL DATA:  History of partial colectomy and wound dehiscence status post ileocolic anastomosis and diverting loop ileostomy with concern for small bowel perforation and possible small  bowel fistula. EXAM: CT ABDOMEN AND PELVIS WITH CONTRAST TECHNIQUE: Multidetector CT imaging of the abdomen and pelvis was performed using the standard protocol following bolus administration of intravenous contrast. RADIATION DOSE REDUCTION: This exam was performed according to the departmental dose-optimization program which includes automated exposure control, adjustment of the mA and/or kV according to patient size and/or use of iterative reconstruction technique. CONTRAST:  75mL OMNIPAQUE  IOHEXOL  350 MG/ML SOLN COMPARISON:  CT abdomen and pelvis dated 05/31/2024 and multiple priors FINDINGS: Lower chest: Slightly decreased but persistent bilateral lower lobe atelectasis/consolidation. Interval near-complete resolution of right middle lobe ground-glass opacities. Resolution of pleural effusions. No pneumothorax. Partially imaged heart size is normal. Coronary artery calcifications. Hepatobiliary: No focal hepatic lesions. No intra or extrahepatic biliary ductal dilation. Normal gallbladder. Pancreas: No focal lesions or main ductal dilation. Spleen: Normal in size without focal abnormality. Adrenals/Urinary Tract: No adrenal nodules. No suspicious renal mass, calculi or  hydronephrosis. Decreased but persistent mild bladder wall thickening at the dome. Stomach/Bowel: Normal appearance of the stomach. Postsurgical changes of the rectosigmoid colon. Patent ileocolic anastomosis deep to the umbilicus. Right lower quadrant ostomy. Colonic diverticulosis without acute diverticulitis. No abnormal bowel dilation. Interval markedly improved inflammatory change involving the proximal duodenum with decreased but persistent mild mural thickening and peri-duodenal inflammatory change. Similar tethered appearance of small bowel to the right paracolic gutter with thickening of the right pararenal fascia. Previously noted extraluminal contrast material is no longer seen. Foci of gas in this area appear intra-luminal. Vascular/Lymphatic: Aortic atherosclerosis. No enlarged abdominal or pelvic lymph nodes. Reproductive: Prostate is unremarkable. Other: Trace presacral fluid.  No free air. Musculoskeletal: No acute or abnormal lytic or blastic osseous lesions. Small fat-containing bilateral inguinal hernias. Postsurgical changes of the anterior abdominal wall. 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 Small volume bilateral flank edema. IMPRESSION: 1. Interval markedly improved inflammatory change involving the proximal duodenum with decreased but persistent mild mural thickening and peri-duodenal inflammatory change. Previously noted extraluminal contrast material is no longer seen. 2. Similar tethered appearance of small bowel to the right paracolic gutter with thickening of the right pararenal fascia. Scattered foci of gas in this area appear intraluminal within the tethered bowel loops. 3. Slightly decreased but persistent bilateral lower lobe atelectasis/consolidation. Interval near-complete resolution of right middle lobe ground-glass opacities. Resolution of pleural effusions. 4.  Aortic Atherosclerosis (ICD10-I70.0). Electronically Signed   By: Limin  Xu M.D.    On: 06/05/2024 17:23   CT ABDOMEN PELVIS W CONTRAST Result Date: 05/31/2024 EXAM: CT ABDOMEN AND PELVIS WITH CONTRAST 05/31/2024 06:57:38 AM TECHNIQUE: CT of the abdomen and pelvis was performed with the administration of intravenous contrast. Multiplanar reformatted images are provided for review. Automated exposure control, iterative reconstruction, and/or weight-based adjustment of the mA/kV was utilized to reduce the radiation dose to as low as reasonably achievable. COMPARISON: 05/28/2024 CLINICAL HISTORY: Peritonitis or perforation suspected; Oral and IV contrast. Pt only drank of oral contrast. FINDINGS: LOWER CHEST: New small right pleural effusion. Progressive consolidation within both lower lung zones with persistent ground glass and airspace densities in the right middle lobe and visualized portions of the right lower lung. LIVER: No suspicious focal liver lesion. GALLBLADDER AND BILE DUCTS: Pericholecystic edema is again noted with mild asymmetric gallbladder wall thickening likely reflecting secondary inflammation. This is similar to the prior exam. No gallstones identified. Common bile duct is normal in caliber. SPLEEN: Normal size. No focal lesion. PANCREAS: No pancreatic mass, main duct dilatation or  inflammation. ADRENAL GLANDS: Normal appearance. No mass. KIDNEYS, URETERS AND BLADDER: Wall thickening along the dome of bladder likely reflux secondary to inflammation, similar to prior exam. No stones in the kidneys or ureters. No hydronephrosis. No perinephric or periureteral stranding. GI AND BOWEL: Enteric contrast material is identified within the stomach and small bowel loops up to the level of the right lower quadrant ileostomy. Enterocolonic anastomosis at the level of the mid transverse colon (axial image 36). Contrast material is identified within the left colon. Extensive edema with surrounding inflammatory change involving the descending duodenum and transverse duodenum (axial image  50/6). Compared with the previous exam, the extent of inflammatory changes within this area appears similar to decreased in the interval. Multiple foci of gas within the right upper quadrant of the abdomen in the area of edema and inflammatory change noted lateral and inferior to the descending duodenum, similar to the previous exam. Cannot exclude focal small bowel fistula. No pathologic dilatation of the bowel loops. Along the right paracolic gutter there is a soft tissue track containing oral contrast material which extends up to the inferior margin of the right lobe of liver and may communicate with the area of inflammatory change with loculated foci of gas (coronal image 37/6). When compared with the previous exam, the degree of contrast material within the soft tissue track appears decreased. PERITONEUM AND RETROPERITONEUM: Trace free fluid within the dependent portion of the pelvis. No free air. VASCULATURE: Aortic atherosclerosis. Patent upper abdominal vascularity. LYMPH NODES: No lymphadenopathy. REPRODUCTIVE ORGANS: No significant abnormality. 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 No acute osseous abnormality. IMPRESSION: 1. Extensive edema and inflammatory change involving the descending and transverse duodenum, similar to prior exam. 2. Soft tissue track containing oral contrast material along the right paracolic gutter, possibly communicating with the area of inflammatory change and loculated gas. The degree of contrast material within the soft tissue track appears decreased compared to the prior exam. This soft tissue tract is suspicious for an underlying small bowel fistula and I suspect likely accounts for the persistent inflammation and loculated gas around the descending duodenum and gallbladder. 3. Mild asymmetric gallbladder wall thickening and pericholecystic edema,  similar to prior exam, likely reflecting secondary inflammation. No gallstones identified. 4. Wall thickening along the dome of the bladder, similar to prior exam, likely secondary to inflammation. 5. New small right pleural effusion and progressive consolidation within both lower lung zones. Electronically signed by: Waddell Calk MD 05/31/2024 10:30 AM EDT RP Workstation: [id number]   US  EKG SITE RITE Result Date: 05/29/2024 If Site Rite image not attached, placement could not be confirmed due to current cardiac rhythm.  DG SMALL BOWEL W SINGLE CM (SOL OR THIN BA) Result Date: 05/28/2024 CLINICAL DATA:  [id number] Free intraperitoneal air [phone] Patient with abdominal pain, a CT abdomen with concern for free air in the mid upper abdomen. Radiology consulted for water -soluble small bowel follow-through. EXAM: SMALL BOWEL SERIES COMPARISON:  CT abdomen pelvis 05/28/24 TECHNIQUE: Following ingestion of 200 mL of water  soluble contrast (Omnipaque  300), serial small bowel images were obtained, with primary focus of the concerning area in the RUQ. FLUOROSCOPY: Radiation Exposure Index (if provided by the fluoroscopic device): 3.20 mGy Number of Acquired Spot Images: 4 FINDINGS: Scout Radiograph: Nonobstructed bowel gas pattern. Postsurgical changes with staple line projecting at the LEFT upper quadrant. Stomach: Normal appearance. No hiatus hernia. Gastric emptying: Normal. Duodenum:  Normal appearance. Other: Follow-up imaging demonstrating nondilated small bowel, intraluminal contrast at the RIGHT upper quadrant without overt extraluminal extravasation. Contrast flows out of the region of interest at the RIGHT upper quadrant into nondilated distal small bowel. IMPRESSION: 1. Nonobstructed small bowel. 2. Intraluminal contrast at the RIGHT upper quadrant, without overt extraluminal extravasation. Contrast flows out of the region interest, into nondilated distal small bowel. Performed by: Wyatt Pommier, PA-C under  supervision of Thom Hall, MD Electronically Signed   By: Thom Hall M.D.   On: 05/28/2024 16:46   DG Abd 1 View Result Date: 05/28/2024 CLINICAL DATA:  [id number] Free intraperitoneal air [id number]. EXAM: ABDOMEN - 1 VIEW COMPARISON:  04/14/2024. FINDINGS: The bowel gas pattern is non-obstructive. There is paucity of bowel gas. No evidence of pneumoperitoneum, within the limitations of a supine film. No acute osseous abnormalities. The soft tissues are within normal limits. Surgical changes, devices, tubes and lines: None. There is excreted contrast over the urinary bladder region. Otherwise, no significant positive oral contrast noted over the bowel loops region. IMPRESSION: *Nonobstructive bowel gas pattern. No evidence of pneumoperitoneum. Electronically Signed   By: Ree Molt M.D.   On: 05/28/2024 08:46   CT ABDOMEN PELVIS WO CONTRAST Result Date: 05/28/2024 CLINICAL DATA:  Sepsis. EXAM: CT ABDOMEN AND PELVIS WITHOUT CONTRAST TECHNIQUE: Multidetector CT imaging of the abdomen and pelvis was performed following the standard protocol without IV contrast. RADIATION DOSE REDUCTION: This exam was performed according to the departmental dose-optimization program which includes automated exposure control, adjustment of the mA and/or kV according to patient size and/or use of iterative reconstruction technique. COMPARISON:  May 27, 2024 FINDINGS: Lower chest: Stable moderate to marked severity areas of atelectasis and/or infiltrate are seen within the bilateral lower lobes. Hepatobiliary: No focal liver abnormality is seen. The gallbladder is moderately distended, without gallstones, gallbladder wall thickening, or biliary dilatation. Pancreas: Unremarkable. No pancreatic ductal dilatation or surrounding inflammatory changes. Spleen: Normal in size without focal abnormality. Adrenals/Urinary Tract: Adrenal glands are unremarkable. Kidneys are normal, without renal calculi, focal lesion, or hydronephrosis. A  large amount of contrast is seen within the lumen of an otherwise normal appearing urinary bladder. Stomach/Bowel: Mild, stable gastric wall thickening is seen along the anterior aspect of the gastric body. There is mild duodenal bulb thickening which is decreased in severity when compared to the prior study. The short segment of dilated proximal duodenum seen on the prior study is normal in caliber on the current exam (axial CT images 28 through 32, CT series 3). The segment of proximal duodenum distal to this region remains thickened (axial CT image 30 through 35, CT series 3). Numerous foci of air are again seen within the mid right abdomen and right upper quadrant (axial CT images 30 through 41, CT series 3). Intraluminal location of these areas of air is not clear and remain concerning for the presence of contained free air. Markedly inflamed, partially opacified small bowel loops are again seen within this region the mid and upper right abdomen. A curvilinear area of oral contrast is seen within the lateral aspect of the mid to lower right abdomen (axial CT images 40 through 46, CT series 3/coronal reformatted images 36 through 42, CT series 6). This may be within a decompressed small bowel loop, however, intraluminal location cannot completely be confirmed. Surgically anastomosed bowel is again seen within the mid left abdomen and distal sigmoid colon. Noninflamed diverticula are again seen throughout the descending and sigmoid colon. Vascular/Lymphatic: Aortic atherosclerosis.  No enlarged abdominal or pelvic lymph nodes. Reproductive: Prostate gland is mildly enlarged. Other: A right lower quadrant ostomy site is seen. No abdominopelvic ascites. Musculoskeletal: Degenerative changes are noted within the lower lumbar spine. IMPRESSION: 1. Findings which remain concerning for the presence of contained free air within the mid right abdomen and right upper quadrant, as described above. 2. Markedly inflamed,  partially opacified small bowel loops within the mid and upper right abdomen. 3. Curvilinear area of oral contrast within the lateral aspect of the mid to lower right abdomen which may be within a decompressed small bowel loop, however, intraluminal location cannot completely be confirmed. 4. Stable moderate to marked severity areas of atelectasis and/or infiltrate within the bilateral lower lobes. 5. Colonic diverticulosis. 6. Aortic atherosclerosis. Electronically Signed   By: Suzen Dials M.D.   On: 05/28/2024 03:07   CT ABDOMEN PELVIS W CONTRAST Result Date: 05/27/2024 CLINICAL DATA:  Abdominal pain. EXAM: CT ABDOMEN AND PELVIS WITH CONTRAST TECHNIQUE: Multidetector CT imaging of the abdomen and pelvis was performed using the standard protocol following bolus administration of intravenous contrast. RADIATION DOSE REDUCTION: This exam was performed according to the departmental dose-optimization program which includes automated exposure control, adjustment of the mA and/or kV according to patient size and/or use of iterative reconstruction technique. CONTRAST:  75mL OMNIPAQUE  IOHEXOL  350 MG/ML SOLN COMPARISON:  April 27, 2024 FINDINGS: Lower chest: Mild patchy right upper lobe and right middle lobe infiltrates are seen. Moderate severity areas of scarring, atelectasis and/or infiltrate are present within the bilateral lung bases. Hepatobiliary: No focal liver abnormality is seen. No gallstones, gallbladder wall thickening, or biliary dilatation. Pancreas: Unremarkable. No pancreatic ductal dilatation or surrounding inflammatory changes. Spleen: Normal in size without focal abnormality. Adrenals/Urinary Tract: Adrenal glands are unremarkable. Kidneys are normal, without renal calculi, focal lesion, or hydronephrosis. Bladder is unremarkable. Stomach/Bowel: There is mild asymmetric gastric wall thickening along the anterior aspect of the gastric antrum. Mild thickening of the duodenal bulb is also noted.  These findings are seen on the prior study. A short segment of prominent (approximally 2.7 cm in diameter), air-filled proximal duodenum is seen just beyond the duodenal bulb (axial CT image 31, CT series 2/coronal reformatted image 58, CT series 5). An abrupt transition zone is seen within the region medial to the gallbladder. Several small foci of extraluminal air are also seen extending along the region adjacent to the gallbladder (axial CT images 32 through 35, CT series 2/coronal reformatted images 44 through 55, CT series 5). Markedly inflamed bowel loops are seen within the right upper quadrant and posterolateral mid right abdomen. Marked severity surrounding mesenteric inflammatory fat stranding is seen. Surgically anastomosed bowel is again seen within the anteromedial aspect of the mid left abdomen and distal sigmoid colon. Numerous noninflamed diverticula are seen throughout the descending and sigmoid colon. Vascular/Lymphatic: Aortic atherosclerosis. No enlarged abdominal or pelvic lymph nodes. Reproductive: The prostate gland is mildly enlarged. Other: A right lower quadrant ostomy site is seen. No abdominopelvic ascites. Musculoskeletal: Degenerative changes are noted within the lower lumbar spine. IMPRESSION: 1. Marked severity enteritis involving a short segment of proximal duodenum and numerous small bowel loops within the right upper quadrant and posterolateral mid right abdomen. 2. Right upper quadrant free air consistent with associated bowel perforation. 3. Postoperative changes consistent with history of bowel resection. 4. Colonic diverticulosis. 5. Mild, patchy right upper lobe and right middle lobe infiltrates. 6. Moderate severity bibasilar scarring, atelectasis and/or infiltrate. 7. Aortic atherosclerosis. Electronically Signed  By: Suzen Dials M.D.   On: 05/27/2024 21:42   DG Chest Port 1 View Result Date: 05/27/2024 CLINICAL DATA:  Possible sepsis EXAM: PORTABLE CHEST 1 VIEW  COMPARISON:  04/26/2024, 02/01/2024 FINDINGS: Hypoventilatory changes.probable scarring at the left base. Patchy atelectasis or minimal infiltrate at the right base. Stable cardiomediastinal silhouette with aortic atherosclerosis. No pleural effusion or pneumothorax. IMPRESSION: Hypoventilatory changes with patchy atelectasis or minimal infiltrate at the right base. Electronically Signed   By: Luke Bun M.D.   On: 05/27/2024 19:48     TODAY-DAY OF DISCHARGE:  Subjective:   Delmas Faucett today has no headache,no chest abdominal pain,no new weakness tingling or numbness, feels much better wants to go home today.   Objective:   Blood pressure (!) 113/58, pulse 72, temperature 97.9 F (36.6 C), temperature source Oral, resp. rate 20, height 5' 9 (1.753 m), weight 68.2 kg, SpO2 90%.  Intake/Output Summary (Last 24 hours) at 06/11/2024 1111 Last data filed at 06/11/2024 0908 Gross per 24 hour  Intake --  Output 1650 ml  Net -1650 ml   Filed Weights   06/06/24 0500 06/08/24 0500 06/09/24 0500  Weight: 67.7 kg 68.1 kg 68.2 kg    Exam: Awake Alert, Oriented *3, No new F.N deficits, Normal affect [place].AT,PERRAL Supple Neck,No JVD, No cervical lymphadenopathy appriciated.  Symmetrical Chest wall movement, Good air movement bilaterally, CTAB RRR,No Gallops,Rubs or new Murmurs, No Parasternal Heave +ve B.Sounds, Abd Soft, Non tender, No organomegaly appriciated, No rebound -guarding or rigidity. No Cyanosis, Clubbing or edema, No new Rash or bruise   PERTINENT RADIOLOGIC STUDIES: No results found.   PERTINENT LAB RESULTS: CBC: Recent Labs    06/09/24 0500 06/11/24 0415  WBC 8.3 5.9  HGB 8.9* 9.0*  HCT 27.9* 28.6*  PLT 335 306   CMET CMP     Component Value Date/Time   NA 135 06/11/2024 0415   NA 141 01/22/2020 1137   K 3.9 06/11/2024 0415   CL 100 06/11/2024 0415   CO2 24 06/11/2024 0415   GLUCOSE 72 06/11/2024 0415   BUN 9 06/11/2024 0415   BUN 13 01/22/2020 1137    CREATININE 0.90 06/11/2024 0415   CREATININE 0.98 06/14/2017 0935   CALCIUM  9.7 06/11/2024 0415   PROT 7.6 06/08/2024 0306   PROT 7.3 01/22/2020 1137   ALBUMIN  2.5 (L) 06/08/2024 0306   ALBUMIN  4.2 01/22/2020 1137   AST 16 06/08/2024 0306   ALT 13 06/08/2024 0306   ALKPHOS 45 06/08/2024 0306   BILITOT 0.3 06/08/2024 0306   BILITOT 0.4 01/22/2020 1137   GFR 73.14 12/03/2023 1437   GFRNONAA >60 06/11/2024 0415   GFRNONAA >89 09/17/2014 1253    GFR Estimated Creatinine Clearance: 82.1 mL/min (by C-G formula based on SCr of 0.9 mg/dL). No results for input(s): LIPASE, AMYLASE in the last 72 hours. No results for input(s): CKTOTAL, CKMB, CKMBINDEX, TROPONINI in the last 72 hours. Invalid input(s): POCBNP No results for input(s): DDIMER in the last 72 hours. No results for input(s): HGBA1C in the last 72 hours. No results for input(s): CHOL, HDL, LDLCALC, TRIG, CHOLHDL, LDLDIRECT in the last 72 hours. No results for input(s): TSH, T4TOTAL, T3FREE, THYROIDAB in the last 72 hours.  Invalid input(s): FREET3 No results for input(s): VITAMINB12, FOLATE, FERRITIN, TIBC, IRON , RETICCTPCT in the last 72 hours. Coags: No results for input(s): INR in the last 72 hours.  Invalid input(s): PT Microbiology: No results found for this or any previous visit (from the past 240  hours).  FURTHER DISCHARGE INSTRUCTIONS:  Get Medicines reviewed and adjusted: Please take all your medications with you for your next visit with your Primary MD  Laboratory/radiological data: Please request your Primary MD to go over all hospital tests and procedure/radiological results at the follow up, please ask your Primary MD to get all Hospital records sent to his/her office.  In some cases, they will be blood work, cultures and biopsy results pending at the time of your discharge. Please request that your primary care M.D. goes through all the records of  your hospital data and follows up on these results.  Also Note the following: If you experience worsening of your admission symptoms, develop shortness of breath, life threatening emergency, suicidal or homicidal thoughts you must seek medical attention immediately by calling 911 or calling your MD immediately  if symptoms less severe.  You must read complete instructions/literature along with all the possible adverse reactions/side effects for all the Medicines you take and that have been prescribed to you. Take any new Medicines after you have completely understood and accpet all the possible adverse reactions/side effects.   Do not drive when taking Pain medications or sleeping medications (Benzodaizepines)  Do not take more than prescribed Pain, Sleep and Anxiety Medications. It is not advisable to combine anxiety,sleep and pain medications without talking with your primary care practitioner  Special Instructions: If you have smoked or chewed Tobacco  in the last 2 yrs please stop smoking, stop any regular Alcohol  and or any Recreational drug use.  Wear Seat belts while driving.  Please note: You were cared for by a hospitalist during your hospital stay. Once you are discharged, your primary care physician will handle any further medical issues. Please note that NO REFILLS for any discharge medications will be authorized once you are discharged, as it is imperative that you return to your primary care physician (or establish a relationship with a primary care physician if you do not have one) for your post hospital discharge needs so that they can reassess your need for medications and monitor your lab values.  Total Time spent coordinating discharge including counseling, education and face to face time equals greater than 30 minutes.  Signed: Lashea Goda 06/11/2024 11:11 AM

## 2024-06-11 NOTE — Progress Notes (Signed)
 PICC removed from left upper arm after sterile site prep. PICC catheter tip visualized and intact at 46cm. Pressure applied till hemostasis achieved. Pressure dressing applied with vaseline gauze and dry gauze. No bleeding, redness and ecchymosis noted. Post PICC removal instruction given including staying in bed for 30 mins and to notify provider/RN for any signs of bleeding.

## 2024-06-11 NOTE — Progress Notes (Signed)
 Subjective/Chief Complaint: Still has some boiling abdominal pain   Objective: Vital signs in last 24 hours: Temp:  [97.8 F (36.6 C)-98.2 F (36.8 C)] 97.9 F (36.6 C) (09/11 0908) Pulse Rate:  [72-79] 72 (09/11 0908) Resp:  [17-20] 20 (09/11 0908) BP: (99-120)/(58-83) 113/58 (09/11 0908) SpO2:  [90 %-95 %] 90 % (09/10 2305) Last BM Date : 06/10/24  Intake/Output from previous day: 09/10 0701 - 09/11 0700 In: -  Out: 2125 [Urine:775; Stool:1350] Intake/Output this shift: Total I/O In: -  Out: 50 [Stool:50]  Exam: Awake and alert Looks comfortable Abdomen is soft, mid line wound granulating well, ostomy working well, No distension or guarding  Lab Results:  Recent Labs    06/09/24 0500 06/11/24 0415  WBC 8.3 5.9  HGB 8.9* 9.0*  HCT 27.9* 28.6*  PLT 335 306   BMET Recent Labs    06/09/24 0500 06/11/24 0415  NA 132* 135  K 4.1 3.9  CL 99 100  CO2 25 24  GLUCOSE 88 72  BUN 8 9  CREATININE 0.86 0.90  CALCIUM  9.1 9.7   PT/INR No results for input(s): LABPROT, INR in the last 72 hours. ABG No results for input(s): PHART, HCO3 in the last 72 hours.  Invalid input(s): PCO2, PO2  Studies/Results: No results found.  Anti-infectives: Anti-infectives (From admission, onward)    Start     Dose/Rate Route Frequency Ordered Stop   05/29/24 1330  linezolid  (ZYVOX ) IVPB 600 mg  Status:  Discontinued        600 mg 300 mL/hr over 60 Minutes Intravenous Every 12 hours 05/29/24 1230 06/10/24 1145   05/28/24 2000  piperacillin -tazobactam (ZOSYN ) IVPB 3.375 g  Status:  Discontinued        3.375 g 12.5 mL/hr over 240 Minutes Intravenous Every 8 hours 05/28/24 0534 06/10/24 1037   05/28/24 0900  metroNIDAZOLE  (FLAGYL ) IVPB 500 mg        500 mg 100 mL/hr over 60 Minutes Intravenous  Once 05/28/24 0534 05/28/24 1104   05/28/24 0630  DAPTOmycin  (CUBICIN ) IVPB 700 mg/152mL premix        10 mg/kg  68 kg 200 mL/hr over 30 Minutes Intravenous  Once  05/28/24 9372 05/28/24 0754   05/27/24 1945  cefTRIAXone  (ROCEPHIN ) 2 g in sodium chloride  0.9 % 100 mL IVPB        2 g 200 mL/hr over 30 Minutes Intravenous Once 05/27/24 1934 05/27/24 2125   05/27/24 1945  metroNIDAZOLE  (FLAGYL ) IVPB 500 mg        500 mg 100 mL/hr over 60 Minutes Intravenous  Once 05/27/24 1934 05/27/24 2236       Assessment/Plan: 67M with complex medical and surgical history, most recent surgery 04/13/24 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 Tanda Regal bowel inflammation with concern for possible perforation - known hostile abdomen - nausea and vomiting, small bowel enteritis and small volume pneumoperitoneum in the RUQ on CT 8/27 - CT 8/28 with concern for inflamed small bowel loop on the right with concern for contained perforation but also possible contrast leak vs contrast within decompressed loop of small  bowel  - SBFT 8/28 without contrast extravasation  - CT 8/31 - same to slightly improved.  - tolerating Reg diet - CT scan 06/05/24 - significant improvement in duodenal inflammation; no evidence of abscess or free air.  -WBC normal today  -continue wound care -ok to return to SNF from a surgical standpoint  Alexander [name] 06/11/2024

## 2024-06-11 NOTE — TOC Transition Note (Signed)
 Transition of Care [facility]) - Discharge Note   Patient Details  Name: Alexander [name] MRN: [id number] Date of Birth: [birth year]  Transition of [facility]) CM/SW Contact:  Inocente GORMAN Kindle, LCSW Phone Number: 06/11/2024, 12:17 PM   Clinical Narrative:    Patient will DC to: [facility] Anticipated DC date: 06/11/24 Family notified: Pt notifying family Transport by: [facility]   Per MD patient ready for DC to [facility]. RN to call report prior to discharge [phone] room 140). RN, patient, patient's family, and facility notified of DC. Discharge Summary and FL2 sent to facility. DC packet on chart including signed script. Ambulance transport requested for patient.   CSW will sign off for now as social work intervention is no longer needed. Please consult us  again if new needs arise.     Final next level of care: Skilled Nursing Facility Barriers to Discharge: Barriers Resolved   Patient Goals and CMS Choice Patient states their goals for this hospitalization and ongoing recovery are:: Return to rehab          Discharge Placement   Existing PASRR number confirmed : 06/11/24          Patient chooses bed at:  [facility]) Patient to be transferred to facility by: [facility]   Patient and family notified of of transfer: 06/11/24  Discharge Plan and Services Additional resources added to the After Visit Summary for   In-house Referral: Clinical Social Work   Post Acute Care Choice: Skilled Nursing Facility                               Social Drivers of Health (SDOH) Interventions SDOH Screenings   Food Insecurity: Patient Unable To Answer (05/28/2024)  Housing: Unknown (05/28/2024)  Recent Concern: Housing - High Risk (04/10/2024)  Transportation Needs: Patient Unable To Answer (05/28/2024)  Recent Concern: Transportation Needs - Unmet Transportation Needs (03/30/2024)  Utilities: Not At Risk (05/28/2024)  Depression (PHQ2-9): Low Risk  (07/24/2023)  Financial Resource Strain: Low  Risk  (05/01/2024)   Received from [facility]  Social Connections: Moderately Isolated (05/01/2024)   Received from [facility]  Stress: No Stress Concern Present (05/13/2024)   Received from [facility]  Tobacco Use: Medium Risk (05/27/2024)     Readmission Risk Interventions    05/28/2024    2:04 PM 04/30/2024   12:54 PM 04/10/2024    1:55 PM  Readmission Risk Prevention Plan  Transportation Screening Complete Complete Complete  Medication Review Oceanographer) Complete Complete Complete  PCP or Specialist appointment within 3-5 days of discharge Complete Complete Complete  HRI or Home Care Consult Complete Complete Complete  SW Recovery Care/Counseling Consult Complete Complete Complete  Palliative Care Screening Not Applicable Not Applicable Not Applicable  Skilled Nursing Facility Complete Not Applicable Not Applicable

## 2024-06-11 NOTE — Plan of Care (Signed)
  Problem: Education: Goal: Knowledge of General Education information will improve Description: Including pain rating scale, medication(s)/side effects and non-pharmacologic comfort measures Outcome: Progressing   Problem: Clinical Measurements: Goal: Will remain free from infection Outcome: Progressing Goal: Diagnostic test results will improve Outcome: Progressing Goal: Cardiovascular complication will be avoided Outcome: Progressing   Problem: Activity: Goal: Risk for activity intolerance will decrease Outcome: Progressing   Problem: Nutrition: Goal: Adequate nutrition will be maintained Outcome: Progressing   Problem: Elimination: Goal: Will not experience complications related to bowel motility Outcome: Progressing

## 2024-06-11 NOTE — Consult Note (Signed)
 WOC Nurse ostomy follow up   Patient well known to [facility] team.  Additional supplies provided to patient, who reports his shipment has arrived.  Patient is aware that SNF will not have his supplies on formulary nor will the [facility].  Patient reports no questions or concerns.  Patient can independently change own pouch.  No WOC needs at this time.  Thank you,  Doyal Polite, RN, MSN, [facility] WOC Team [phone] (Available Mon-Fri 0700-1500)
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
## 2024-06-12 DIAGNOSIS — D649 Anemia, unspecified: Secondary | ICD-10-CM | POA: Diagnosis not present

## 2024-06-12 DIAGNOSIS — K5790 Diverticulosis of intestine, part unspecified, without perforation or abscess without bleeding: Secondary | ICD-10-CM | POA: Diagnosis not present

## 2024-06-12 DIAGNOSIS — K219 Gastro-esophageal reflux disease without esophagitis: Secondary | ICD-10-CM | POA: Diagnosis not present

## 2024-06-12 DIAGNOSIS — A419 Sepsis, unspecified organism: Secondary | ICD-10-CM | POA: Diagnosis not present

## 2024-06-12 DIAGNOSIS — Z86711 Personal history of pulmonary embolism: Secondary | ICD-10-CM | POA: Diagnosis not present

## 2024-06-12 DIAGNOSIS — R52 Pain, unspecified: Secondary | ICD-10-CM | POA: Diagnosis not present

## 2024-06-15 ENCOUNTER — Telehealth: Payer: Self-pay | Admitting: *Deleted

## 2024-06-15 NOTE — Telephone Encounter (Signed)
 Called Dorothe, told her okay for Skilled Nursing for patient. She said I do not need orders I already have them, just wanted to let us  know that he will be followed by them starting tomorrow. Told her okay, thank you.

## 2024-06-15 NOTE — Telephone Encounter (Signed)
 Copied from CRM #[id number]. Topic: Clinical - Home Health Verbal Orders >> Jun 15, 2024  1:12 PM Viola FALCON wrote: Caller/Agency: Dorothe from [facility]  Callback Number: [phone] Service Requested: Skilled Nursing Frequency: N/A  Any new concerns about the patient? Yes, FYI - patient will be active with [facility] once he discharges from [facility] tomorrow

## 2024-06-17 DIAGNOSIS — Z433 Encounter for attention to colostomy: Secondary | ICD-10-CM | POA: Diagnosis not present

## 2024-06-18 ENCOUNTER — Telehealth: Payer: Self-pay

## 2024-06-18 DIAGNOSIS — Z79891 Long term (current) use of opiate analgesic: Secondary | ICD-10-CM | POA: Diagnosis not present

## 2024-06-18 DIAGNOSIS — Z433 Encounter for attention to colostomy: Secondary | ICD-10-CM | POA: Diagnosis not present

## 2024-06-18 DIAGNOSIS — T8131XD Disruption of external operation (surgical) wound, not elsewhere classified, subsequent encounter: Secondary | ICD-10-CM | POA: Diagnosis not present

## 2024-06-18 DIAGNOSIS — I5032 Chronic diastolic (congestive) heart failure: Secondary | ICD-10-CM | POA: Diagnosis not present

## 2024-06-18 DIAGNOSIS — G8929 Other chronic pain: Secondary | ICD-10-CM | POA: Diagnosis not present

## 2024-06-18 DIAGNOSIS — D649 Anemia, unspecified: Secondary | ICD-10-CM | POA: Diagnosis not present

## 2024-06-18 DIAGNOSIS — Z7901 Long term (current) use of anticoagulants: Secondary | ICD-10-CM | POA: Diagnosis not present

## 2024-06-18 DIAGNOSIS — Z7982 Long term (current) use of aspirin: Secondary | ICD-10-CM | POA: Diagnosis not present

## 2024-06-18 DIAGNOSIS — E46 Unspecified protein-calorie malnutrition: Secondary | ICD-10-CM | POA: Diagnosis not present

## 2024-06-18 NOTE — Telephone Encounter (Signed)
 Transition Care Management Follow-up Telephone Call Date of discharge and from where: 06/17/24 [facility] How have you been since you were released from the hospital? Better Any questions or concerns? No  Items Reviewed: Did the pt receive and understand the discharge instructions provided? Yes  Medications obtained and verified? Yes  Other? No  Any new allergies since your discharge? No  Dietary orders reviewed? No Do you have support at home? Yes   Home Care and Equipment/Supplies: Were home health services ordered? no If so, what is the name of the agency? [facility]  Has the agency set up a time to come to the patient's home? yes Were any new equipment or medical supplies ordered?  Yes: previously ordered by PCP What is the name of the medical supply agency? unknown Were you able to get the supplies/equipment? no Do you have any questions related to the use of the equipment or supplies? No  Functional Questionnaire: (I = Independent and D = Dependent) ADLs: I  Bathing/Dressing- I  Meal Prep- I  Eating- I  Maintaining continence- I  Transferring/Ambulation- I  Managing Meds- I  Follow up appointments reviewed:  PCP Hospital f/u appt confirmed? Yes  Scheduled to see Lucie Buttner on 06/24/24. Specialist Hospital f/u appt confirmed? No   Are transportation arrangements needed? No  If their condition worsens, is the pt aware to call PCP or go to the Emergency Dept.? Yes Was the patient provided with contact information for the PCP's office or ED? Yes Was to pt encouraged to call back with questions or concerns? Yes

## 2024-06-19 ENCOUNTER — Telehealth: Payer: Self-pay | Admitting: *Deleted

## 2024-06-19 ENCOUNTER — Ambulatory Visit (HOSPITAL_COMMUNITY)
Admission: RE | Admit: 2024-06-19 | Discharge: 2024-06-19 | Disposition: A | Discharge status: Home / Self Care | Source: Ambulatory Visit | Attending: Physician Assistant | Admitting: Physician Assistant

## 2024-06-19 DIAGNOSIS — Z432 Encounter for attention to ileostomy: Secondary | ICD-10-CM | POA: Diagnosis not present

## 2024-06-19 DIAGNOSIS — K9419 Other complications of enterostomy: Secondary | ICD-10-CM | POA: Insufficient documentation

## 2024-06-19 DIAGNOSIS — L24B3 Irritant contact dermatitis related to fecal or urinary stoma or fistula: Secondary | ICD-10-CM | POA: Diagnosis not present

## 2024-06-19 DIAGNOSIS — Z932 Ileostomy status: Secondary | ICD-10-CM | POA: Diagnosis not present

## 2024-06-19 DIAGNOSIS — Y838 Other surgical procedures as the cause of abnormal reaction of the patient, or of later complication, without mention of misadventure at the time of the procedure: Secondary | ICD-10-CM | POA: Insufficient documentation

## 2024-06-19 DIAGNOSIS — T8131XA Disruption of external operation (surgical) wound, not elsewhere classified, initial encounter: Secondary | ICD-10-CM

## 2024-06-19 NOTE — Telephone Encounter (Signed)
 Called [facility] and spoke to Villa Quintero, verbal orders given for : Service Requested: colostomy care/changes, wound care, pt and ot eval Frequency: colostomy care/changes, twice a week for 8 weeks.  Wound care twice a week,  Physical therapy and occupational therapy come and evaluation. Told her okay for services for pt per [facility]. Cecilia verbalized understanding.
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
## 2024-06-19 NOTE — Telephone Encounter (Signed)
 Copied from CRM [id number]. Topic: Clinical - Home Health Verbal Orders >> Jun 18, 2024  3:41 PM Rea BROCKS wrote: Caller/Agency: [facility]  Callback Number: [phone] Service Requested: colostomy care/changes, wound care, pt and ot eval Frequency: colostomy care/changes, twice a week for 8 weeks.  Wound care twice a week,  Physical therapy and occupational therapy come and evaluation  Any new concerns about the patient? no

## 2024-06-19 NOTE — Telephone Encounter (Signed)
 Copied from CRM [id number]. Topic: Clinical - Home Health Verbal Orders >> Jun 19, 2024  1:31 PM Suzen [name] wrote: Caller/Agency: Will/ [facility] Callback Number: [phone] Service Requested: Occupational Therapy; Would like to move OT to next week Frequency: TBD Any new concerns about the patient? No

## 2024-06-22 DIAGNOSIS — I5032 Chronic diastolic (congestive) heart failure: Secondary | ICD-10-CM | POA: Diagnosis not present

## 2024-06-22 DIAGNOSIS — D649 Anemia, unspecified: Secondary | ICD-10-CM | POA: Diagnosis not present

## 2024-06-22 DIAGNOSIS — G8929 Other chronic pain: Secondary | ICD-10-CM | POA: Diagnosis not present

## 2024-06-22 DIAGNOSIS — T8131XD Disruption of external operation (surgical) wound, not elsewhere classified, subsequent encounter: Secondary | ICD-10-CM | POA: Diagnosis not present

## 2024-06-22 DIAGNOSIS — Z433 Encounter for attention to colostomy: Secondary | ICD-10-CM | POA: Diagnosis not present

## 2024-06-22 DIAGNOSIS — Z7982 Long term (current) use of aspirin: Secondary | ICD-10-CM | POA: Diagnosis not present

## 2024-06-22 DIAGNOSIS — E46 Unspecified protein-calorie malnutrition: Secondary | ICD-10-CM | POA: Diagnosis not present

## 2024-06-22 DIAGNOSIS — Z79891 Long term (current) use of opiate analgesic: Secondary | ICD-10-CM | POA: Diagnosis not present

## 2024-06-22 DIAGNOSIS — Z7901 Long term (current) use of anticoagulants: Secondary | ICD-10-CM | POA: Diagnosis not present

## 2024-06-22 NOTE — Telephone Encounter (Signed)
 Called Alexander [name] with [facility], verbal orders given for Service Requested: Occupational Therapy; Would like to move OT to next week Frequency: TBD. Told him that is fine per [facility]. Alexander [name] verbalized understanding.

## 2024-06-23 ENCOUNTER — Telehealth: Payer: Self-pay | Admitting: Physician Assistant

## 2024-06-23 ENCOUNTER — Ambulatory Visit (HOSPITAL_COMMUNITY): Admitting: Nurse Practitioner

## 2024-06-23 ENCOUNTER — Ambulatory Visit (HOSPITAL_COMMUNITY)
Admission: RE | Admit: 2024-06-23 | Discharge: 2024-06-23 | Disposition: A | Discharge status: Home / Self Care | Source: Ambulatory Visit | Attending: Nurse Practitioner | Admitting: Nurse Practitioner

## 2024-06-23 DIAGNOSIS — R5381 Other malaise: Secondary | ICD-10-CM | POA: Diagnosis not present

## 2024-06-23 DIAGNOSIS — R2689 Other abnormalities of gait and mobility: Secondary | ICD-10-CM | POA: Insufficient documentation

## 2024-06-23 DIAGNOSIS — Z932 Ileostomy status: Secondary | ICD-10-CM | POA: Diagnosis not present

## 2024-06-23 DIAGNOSIS — L24B3 Irritant contact dermatitis related to fecal or urinary stoma or fistula: Secondary | ICD-10-CM

## 2024-06-23 DIAGNOSIS — Z9049 Acquired absence of other specified parts of digestive tract: Secondary | ICD-10-CM | POA: Diagnosis not present

## 2024-06-23 DIAGNOSIS — T8131XA Disruption of external operation (surgical) wound, not elsewhere classified, initial encounter: Secondary | ICD-10-CM | POA: Diagnosis not present

## 2024-06-23 DIAGNOSIS — Y833 Surgical operation with formation of external stoma as the cause of abnormal reaction of the patient, or of later complication, without mention of misadventure at the time of the procedure: Secondary | ICD-10-CM | POA: Diagnosis not present

## 2024-06-23 DIAGNOSIS — K9419 Other complications of enterostomy: Secondary | ICD-10-CM | POA: Insufficient documentation

## 2024-06-23 DIAGNOSIS — T8189XA Other complications of procedures, not elsewhere classified, initial encounter: Secondary | ICD-10-CM | POA: Insufficient documentation

## 2024-06-23 DIAGNOSIS — T8130XA Disruption of wound, unspecified, initial encounter: Secondary | ICD-10-CM | POA: Insufficient documentation

## 2024-06-23 NOTE — Discharge Instructions (Signed)
 No changes. See you next Tuesday at 9:30
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
## 2024-06-23 NOTE — Telephone Encounter (Unsigned)
 Copied from CRM [id number]. Topic: Clinical - Home Health Verbal Orders >> Jun 23, 2024  4:29 PM Maisie BROCKS wrote: Caller/Agency: almira with [facility] Callback Number: [phone] Service Requested: Physical Therapy Frequency: 2x week for 3 weeks, then 1week1 for re-assessment  Any new concerns about the patient? No

## 2024-06-23 NOTE — Discharge Instructions (Signed)
 Continue coloplast 1 piece convex Will renew with [facility]

## 2024-06-24 ENCOUNTER — Encounter: Payer: Self-pay | Admitting: Physician Assistant

## 2024-06-24 ENCOUNTER — Ambulatory Visit (INDEPENDENT_AMBULATORY_CARE_PROVIDER_SITE_OTHER)

## 2024-06-24 ENCOUNTER — Other Ambulatory Visit: Payer: Self-pay | Admitting: Physician Assistant

## 2024-06-24 ENCOUNTER — Ambulatory Visit: Payer: Self-pay | Admitting: Physician Assistant

## 2024-06-24 ENCOUNTER — Ambulatory Visit: Admitting: Physician Assistant

## 2024-06-24 VITALS — BP 126/70 | HR 102 | Temp 98.4°F | Ht 69.0 in | Wt 149.2 lb

## 2024-06-24 DIAGNOSIS — Z932 Ileostomy status: Secondary | ICD-10-CM

## 2024-06-24 DIAGNOSIS — Z5189 Encounter for other specified aftercare: Secondary | ICD-10-CM

## 2024-06-24 DIAGNOSIS — M79651 Pain in right thigh: Secondary | ICD-10-CM

## 2024-06-24 DIAGNOSIS — M542 Cervicalgia: Secondary | ICD-10-CM | POA: Diagnosis not present

## 2024-06-24 DIAGNOSIS — M1A9XX0 Chronic gout, unspecified, without tophus (tophi): Secondary | ICD-10-CM

## 2024-06-24 DIAGNOSIS — M47812 Spondylosis without myelopathy or radiculopathy, cervical region: Secondary | ICD-10-CM | POA: Diagnosis not present

## 2024-06-24 LAB — CBC WITH DIFFERENTIAL/PLATELET
Basophils Absolute: 0 K/uL (ref 0.0–0.1)
Basophils Relative: 0.4 % (ref 0.0–3.0)
Eosinophils Absolute: 0 K/uL (ref 0.0–0.7)
Eosinophils Relative: 0 % (ref 0.0–5.0)
HCT: 33.8 % — ABNORMAL LOW (ref 39.0–52.0)
Hemoglobin: 11.1 g/dL — ABNORMAL LOW (ref 13.0–17.0)
Lymphocytes Relative: 25.2 % (ref 12.0–46.0)
Lymphs Abs: 1.5 K/uL (ref 0.7–4.0)
MCHC: 32.9 g/dL (ref 30.0–36.0)
MCV: 93.1 fl (ref 78.0–100.0)
Monocytes Absolute: 0.5 K/uL (ref 0.1–1.0)
Monocytes Relative: 9 % (ref 3.0–12.0)
Neutro Abs: 3.9 K/uL (ref 1.4–7.7)
Neutrophils Relative %: 65.4 % (ref 43.0–77.0)
Platelets: 404 K/uL — ABNORMAL HIGH (ref 150.0–400.0)
RBC: 3.63 Mil/uL — ABNORMAL LOW (ref 4.22–5.81)
RDW: 18.6 % — ABNORMAL HIGH (ref 11.5–15.5)
WBC: 6 K/uL (ref 4.0–10.5)

## 2024-06-24 LAB — COMPREHENSIVE METABOLIC PANEL WITH GFR
ALT: 24 U/L (ref 0–53)
AST: 22 U/L (ref 0–37)
Albumin: 4 g/dL (ref 3.5–5.2)
Alkaline Phosphatase: 80 U/L (ref 39–117)
BUN: 17 mg/dL (ref 6–23)
CO2: 20 meq/L (ref 19–32)
Calcium: 10.6 mg/dL — ABNORMAL HIGH (ref 8.4–10.5)
Chloride: 109 meq/L (ref 96–112)
Creatinine, Ser: 0.89 mg/dL (ref 0.40–1.50)
GFR: 91.99 mL/min (ref >60.00)
Glucose, Bld: 75 mg/dL (ref 70–99)
Potassium: 4.2 meq/L (ref 3.5–5.1)
Sodium: 134 meq/L — ABNORMAL LOW (ref 135–145)
Total Bilirubin: 0.2 mg/dL (ref 0.2–1.2)
Total Protein: 8.4 g/dL — ABNORMAL HIGH (ref 6.0–8.3)

## 2024-06-24 MED ORDER — PREDNISONE 20 MG PO TABS: 20.0000 mg | ORAL_TABLET | Freq: Two times a day (BID) | ORAL | 0 refills | Status: DC

## 2024-06-24 MED ORDER — TRAZODONE HCL 100 MG PO TABS: 100.0000 mg | ORAL_TABLET | Freq: Every day | ORAL | 1 refills | Status: DC
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
## 2024-06-24 NOTE — Telephone Encounter (Signed)
 Spoke to Almira with [facility], verbal orders given for: Service Requested: Physical Therapy Frequency: 2 x's week for 3 weeks, then1 x a week 1 for re-assessment, Told her okay per [place]. Almira verbalized understanding.

## 2024-06-26 ENCOUNTER — Telehealth: Payer: Self-pay | Admitting: Physician Assistant

## 2024-06-26 ENCOUNTER — Telehealth: Payer: Self-pay | Admitting: *Deleted

## 2024-06-26 DIAGNOSIS — Z7901 Long term (current) use of anticoagulants: Secondary | ICD-10-CM | POA: Diagnosis not present

## 2024-06-26 DIAGNOSIS — T8131XD Disruption of external operation (surgical) wound, not elsewhere classified, subsequent encounter: Secondary | ICD-10-CM | POA: Diagnosis not present

## 2024-06-26 DIAGNOSIS — Z433 Encounter for attention to colostomy: Secondary | ICD-10-CM | POA: Diagnosis not present

## 2024-06-26 DIAGNOSIS — Z79891 Long term (current) use of opiate analgesic: Secondary | ICD-10-CM | POA: Diagnosis not present

## 2024-06-26 DIAGNOSIS — E46 Unspecified protein-calorie malnutrition: Secondary | ICD-10-CM | POA: Diagnosis not present

## 2024-06-26 DIAGNOSIS — I5032 Chronic diastolic (congestive) heart failure: Secondary | ICD-10-CM | POA: Diagnosis not present

## 2024-06-26 DIAGNOSIS — D649 Anemia, unspecified: Secondary | ICD-10-CM | POA: Diagnosis not present

## 2024-06-26 DIAGNOSIS — G8929 Other chronic pain: Secondary | ICD-10-CM | POA: Diagnosis not present

## 2024-06-26 DIAGNOSIS — Z7982 Long term (current) use of aspirin: Secondary | ICD-10-CM | POA: Diagnosis not present

## 2024-06-26 NOTE — Telephone Encounter (Signed)
 Copied from CRM [id number]. Topic: Clinical - Home Health Verbal Orders >> Jun 25, 2024  4:47 PM Chiquita SQUIBB wrote: Caller/Agency: [facility]  Callback Number: [phone] Service Requested: Occupational Therapy Frequency: 1 time a week for 4 weeks  Any new concerns about the patient? No

## 2024-06-26 NOTE — Telephone Encounter (Signed)
 Please call patient and schedule 3 month follow up per [facility].

## 2024-06-26 NOTE — Telephone Encounter (Signed)
 HH FORMS received via fax Type of Form: [facility] charge sheet attached and placed in provider folder at front desk.

## 2024-06-26 NOTE — Telephone Encounter (Signed)
 Copied from CRM [id number]. Topic: Clinical - Lab/Test Results >> Jun 25, 2024  4:08 PM Lauren C wrote: Reason for CRM: Pt returning call for bloodwork. Results relayed. No further questions about labs, but patient is wondering if he needs to make a follow up appt. Worley's notes say she will continue to monitor at follow up, but no fu is scheduled. Please reach out to pt if another appt is needed.

## 2024-06-27 DIAGNOSIS — Z932 Ileostomy status: Secondary | ICD-10-CM | POA: Diagnosis not present

## 2024-06-29 DIAGNOSIS — I5032 Chronic diastolic (congestive) heart failure: Secondary | ICD-10-CM | POA: Diagnosis not present

## 2024-06-29 DIAGNOSIS — T8131XD Disruption of external operation (surgical) wound, not elsewhere classified, subsequent encounter: Secondary | ICD-10-CM | POA: Diagnosis not present

## 2024-06-29 DIAGNOSIS — D649 Anemia, unspecified: Secondary | ICD-10-CM | POA: Diagnosis not present

## 2024-06-29 DIAGNOSIS — G8929 Other chronic pain: Secondary | ICD-10-CM | POA: Diagnosis not present

## 2024-06-29 DIAGNOSIS — Z433 Encounter for attention to colostomy: Secondary | ICD-10-CM | POA: Diagnosis not present

## 2024-06-29 DIAGNOSIS — E46 Unspecified protein-calorie malnutrition: Secondary | ICD-10-CM | POA: Diagnosis not present

## 2024-06-29 DIAGNOSIS — Z79891 Long term (current) use of opiate analgesic: Secondary | ICD-10-CM | POA: Diagnosis not present

## 2024-06-29 DIAGNOSIS — Z7901 Long term (current) use of anticoagulants: Secondary | ICD-10-CM | POA: Diagnosis not present

## 2024-06-29 NOTE — Telephone Encounter (Signed)
 Orders signed and faxed back to [facility].

## 2024-06-29 NOTE — Telephone Encounter (Signed)
 Orders were signed and faxed back

## 2024-06-30 ENCOUNTER — Ambulatory Visit (HOSPITAL_COMMUNITY)
Admission: RE | Admit: 2024-06-30 | Discharge: 2024-06-30 | Disposition: A | Discharge status: Home / Self Care | Source: Ambulatory Visit | Attending: Nurse Practitioner | Admitting: Nurse Practitioner

## 2024-06-30 DIAGNOSIS — L24B3 Irritant contact dermatitis related to fecal or urinary stoma or fistula: Secondary | ICD-10-CM | POA: Diagnosis not present

## 2024-06-30 DIAGNOSIS — Z432 Encounter for attention to ileostomy: Secondary | ICD-10-CM | POA: Diagnosis not present

## 2024-06-30 DIAGNOSIS — Z932 Ileostomy status: Secondary | ICD-10-CM | POA: Insufficient documentation

## 2024-06-30 MED ORDER — ATORVASTATIN CALCIUM 20 MG PO TABS: 20.0000 mg | ORAL_TABLET | Freq: Every day | ORAL | 3 refills | Status: DC
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
## 2024-06-30 NOTE — Discharge Instructions (Signed)
 Ostomy 1 piece pouch  16706 Belt 4237 Barrier strips  [id number] Skin prep 7917 Barrier ring  Adhesive remover Powder Waterproof tape.

## 2024-07-01 NOTE — Telephone Encounter (Signed)
 Please call patient to schedule a 3 month follow up with Samantha and also schedule Bone Density Scan. Thanks.

## 2024-07-02 ENCOUNTER — Other Ambulatory Visit (HOSPITAL_COMMUNITY): Payer: Self-pay | Admitting: Nurse Practitioner

## 2024-07-02 DIAGNOSIS — Z7901 Long term (current) use of anticoagulants: Secondary | ICD-10-CM | POA: Diagnosis not present

## 2024-07-02 DIAGNOSIS — E46 Unspecified protein-calorie malnutrition: Secondary | ICD-10-CM | POA: Diagnosis not present

## 2024-07-02 DIAGNOSIS — I5032 Chronic diastolic (congestive) heart failure: Secondary | ICD-10-CM | POA: Diagnosis not present

## 2024-07-02 DIAGNOSIS — G8929 Other chronic pain: Secondary | ICD-10-CM | POA: Diagnosis not present

## 2024-07-02 DIAGNOSIS — D649 Anemia, unspecified: Secondary | ICD-10-CM | POA: Diagnosis not present

## 2024-07-02 DIAGNOSIS — Z79891 Long term (current) use of opiate analgesic: Secondary | ICD-10-CM | POA: Diagnosis not present

## 2024-07-02 DIAGNOSIS — Z7982 Long term (current) use of aspirin: Secondary | ICD-10-CM | POA: Diagnosis not present

## 2024-07-02 DIAGNOSIS — R198 Other specified symptoms and signs involving the digestive system and abdomen: Secondary | ICD-10-CM

## 2024-07-02 DIAGNOSIS — T8131XD Disruption of external operation (surgical) wound, not elsewhere classified, subsequent encounter: Secondary | ICD-10-CM | POA: Diagnosis not present

## 2024-07-02 DIAGNOSIS — Z433 Encounter for attention to colostomy: Secondary | ICD-10-CM | POA: Diagnosis not present

## 2024-07-02 DIAGNOSIS — L24B3 Irritant contact dermatitis related to fecal or urinary stoma or fistula: Secondary | ICD-10-CM

## 2024-07-03 NOTE — Progress Notes (Signed)
 Noted

## 2024-07-07 ENCOUNTER — Ambulatory Visit (HOSPITAL_COMMUNITY)
Admission: RE | Admit: 2024-07-07 | Discharge: 2024-07-07 | Disposition: A | Discharge status: Home / Self Care | Source: Ambulatory Visit | Attending: Nurse Practitioner | Admitting: Nurse Practitioner

## 2024-07-07 DIAGNOSIS — T8189XD Other complications of procedures, not elsewhere classified, subsequent encounter: Secondary | ICD-10-CM

## 2024-07-07 DIAGNOSIS — K9419 Other complications of enterostomy: Secondary | ICD-10-CM | POA: Diagnosis not present

## 2024-07-07 DIAGNOSIS — Z432 Encounter for attention to ileostomy: Secondary | ICD-10-CM | POA: Diagnosis not present

## 2024-07-07 DIAGNOSIS — T8131XA Disruption of external operation (surgical) wound, not elsewhere classified, initial encounter: Secondary | ICD-10-CM | POA: Diagnosis not present

## 2024-07-07 NOTE — Discharge Instructions (Signed)
 Wound bed is slick and shiny, adding Aquacel to wound bed.
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
## 2024-07-08 ENCOUNTER — Telehealth: Payer: Self-pay | Admitting: Physician Assistant

## 2024-07-08 NOTE — Telephone Encounter (Signed)
 LVM to schedule Bone Density. Please transfer to [employer]

## 2024-07-08 NOTE — Telephone Encounter (Signed)
 Copied from CRM #[id number]. Topic: Appointments - Scheduling Inquiry for Clinic >> Jul 07, 2024  4:32 PM Drema MATSU wrote: Reason for CRM: Patient wants to schedule a bone density scan.

## 2024-07-09 DIAGNOSIS — I5032 Chronic diastolic (congestive) heart failure: Secondary | ICD-10-CM | POA: Diagnosis not present

## 2024-07-09 DIAGNOSIS — E46 Unspecified protein-calorie malnutrition: Secondary | ICD-10-CM | POA: Diagnosis not present

## 2024-07-09 DIAGNOSIS — Z433 Encounter for attention to colostomy: Secondary | ICD-10-CM | POA: Diagnosis not present

## 2024-07-09 DIAGNOSIS — Z7901 Long term (current) use of anticoagulants: Secondary | ICD-10-CM | POA: Diagnosis not present

## 2024-07-09 DIAGNOSIS — T8131XD Disruption of external operation (surgical) wound, not elsewhere classified, subsequent encounter: Secondary | ICD-10-CM | POA: Diagnosis not present

## 2024-07-09 DIAGNOSIS — Z79891 Long term (current) use of opiate analgesic: Secondary | ICD-10-CM | POA: Diagnosis not present

## 2024-07-09 DIAGNOSIS — G8929 Other chronic pain: Secondary | ICD-10-CM | POA: Diagnosis not present

## 2024-07-09 DIAGNOSIS — T8189XD Other complications of procedures, not elsewhere classified, subsequent encounter: Secondary | ICD-10-CM | POA: Insufficient documentation

## 2024-07-09 DIAGNOSIS — Z7982 Long term (current) use of aspirin: Secondary | ICD-10-CM | POA: Diagnosis not present

## 2024-07-13 DIAGNOSIS — T8189XA Other complications of procedures, not elsewhere classified, initial encounter: Secondary | ICD-10-CM | POA: Diagnosis not present

## 2024-07-14 ENCOUNTER — Inpatient Hospital Stay: Admission: RE | Admit: 2024-07-14 | Discharge: 2024-07-14 | Attending: Physician Assistant

## 2024-07-14 ENCOUNTER — Telehealth: Payer: Self-pay | Admitting: Physician Assistant

## 2024-07-14 DIAGNOSIS — Z79891 Long term (current) use of opiate analgesic: Secondary | ICD-10-CM

## 2024-07-14 DIAGNOSIS — M79651 Pain in right thigh: Secondary | ICD-10-CM

## 2024-07-14 DIAGNOSIS — I5032 Chronic diastolic (congestive) heart failure: Secondary | ICD-10-CM | POA: Diagnosis not present

## 2024-07-14 DIAGNOSIS — Z7901 Long term (current) use of anticoagulants: Secondary | ICD-10-CM

## 2024-07-14 DIAGNOSIS — G8929 Other chronic pain: Secondary | ICD-10-CM

## 2024-07-14 DIAGNOSIS — M85851 Other specified disorders of bone density and structure, right thigh: Secondary | ICD-10-CM

## 2024-07-14 DIAGNOSIS — T8131XD Disruption of external operation (surgical) wound, not elsewhere classified, subsequent encounter: Secondary | ICD-10-CM | POA: Diagnosis not present

## 2024-07-14 DIAGNOSIS — Z7982 Long term (current) use of aspirin: Secondary | ICD-10-CM

## 2024-07-14 DIAGNOSIS — D649 Anemia, unspecified: Secondary | ICD-10-CM

## 2024-07-14 DIAGNOSIS — Z433 Encounter for attention to colostomy: Secondary | ICD-10-CM | POA: Diagnosis not present

## 2024-07-14 DIAGNOSIS — E46 Unspecified protein-calorie malnutrition: Secondary | ICD-10-CM | POA: Diagnosis not present

## 2024-07-14 NOTE — Telephone Encounter (Signed)
 [facility] faxed Home Health Certificate (Order [place] [id number]), to be filled out by provider. Patient requested to send it back via Fax within ASAP. Document is located in providers tray at front office.Please advise at [phone].

## 2024-07-15 ENCOUNTER — Encounter: Payer: Self-pay | Admitting: Physician Assistant

## 2024-07-15 ENCOUNTER — Other Ambulatory Visit: Payer: Self-pay | Admitting: Physician Assistant

## 2024-07-15 NOTE — Telephone Encounter (Signed)
 This encounter was created in error - please disregard.

## 2024-07-15 NOTE — Telephone Encounter (Unsigned)
 Copied from CRM #[id number]. Topic: Clinical - Medication Refill >> Jul 15, 2024 10:32 AM Thersia C wrote: Medication: apixaban  (ELIQUIS ) 5 MG TABS tablet traZODone  (DESYREL ) 100 MG tablet atorvastatin (LIPITOR) 20 MG tablet  gabapentin  (NEURONTIN ) 100 MG capsule methocarbamol  (ROBAXIN ) 500 MG tablet  Has the patient contacted their pharmacy? Yes (Agent: If no, request that the patient contact the pharmacy for the refill. If patient does not wish to contact the pharmacy document the reason why and proceed with request.) (Agent: If yes, when and what did the pharmacy advise?)  This is the patient's preferred pharmacy:  [id number] [address] [place], [place] - [address] [place] [address] Phone: [phone] Fax: [phone]  Is this the correct pharmacy for this prescription? Yes If no, delete pharmacy and type the correct one.   Has the prescription been filled recently? No  Is the patient out of the medication? Yes  Has the patient been seen for an appointment in the last year OR does the patient have an upcoming appointment? Yes  Can we respond through MyChart? Yes  Agent: Please be advised that Rx refills may take up to 3 business days. We ask that you follow-up with your pharmacy.

## 2024-07-16 ENCOUNTER — Other Ambulatory Visit: Payer: Self-pay | Admitting: *Deleted

## 2024-07-16 ENCOUNTER — Ambulatory Visit: Payer: Self-pay | Admitting: Physician Assistant

## 2024-07-16 ENCOUNTER — Ambulatory Visit: Payer: Self-pay

## 2024-07-16 DIAGNOSIS — I5032 Chronic diastolic (congestive) heart failure: Secondary | ICD-10-CM | POA: Diagnosis not present

## 2024-07-16 DIAGNOSIS — E46 Unspecified protein-calorie malnutrition: Secondary | ICD-10-CM | POA: Diagnosis not present

## 2024-07-16 DIAGNOSIS — D649 Anemia, unspecified: Secondary | ICD-10-CM

## 2024-07-16 DIAGNOSIS — Z433 Encounter for attention to colostomy: Secondary | ICD-10-CM | POA: Diagnosis not present

## 2024-07-16 DIAGNOSIS — M8580 Other specified disorders of bone density and structure, unspecified site: Secondary | ICD-10-CM | POA: Insufficient documentation

## 2024-07-16 DIAGNOSIS — T8131XD Disruption of external operation (surgical) wound, not elsewhere classified, subsequent encounter: Secondary | ICD-10-CM | POA: Diagnosis not present

## 2024-07-16 DIAGNOSIS — G8929 Other chronic pain: Secondary | ICD-10-CM

## 2024-07-16 DIAGNOSIS — Z7901 Long term (current) use of anticoagulants: Secondary | ICD-10-CM

## 2024-07-16 DIAGNOSIS — M85851 Other specified disorders of bone density and structure, right thigh: Secondary | ICD-10-CM

## 2024-07-16 DIAGNOSIS — Z79891 Long term (current) use of opiate analgesic: Secondary | ICD-10-CM

## 2024-07-16 DIAGNOSIS — Z7982 Long term (current) use of aspirin: Secondary | ICD-10-CM

## 2024-07-16 MED ORDER — GABAPENTIN 100 MG PO CAPS: 200.0000 mg | ORAL_CAPSULE | Freq: Three times a day (TID) | ORAL | 1 refills | Status: DC

## 2024-07-16 MED ORDER — APIXABAN 5 MG PO TABS: 5.0000 mg | ORAL_TABLET | Freq: Two times a day (BID) | ORAL | 1 refills | Status: AC

## 2024-07-16 MED ORDER — GABAPENTIN 100 MG PO CAPS: 200.0000 mg | ORAL_CAPSULE | Freq: Three times a day (TID) | ORAL | 2 refills | Status: DC

## 2024-07-16 MED ORDER — METHOCARBAMOL 500 MG PO TABS: 500.0000 mg | ORAL_TABLET | Freq: Three times a day (TID) | ORAL | 1 refills | Status: DC

## 2024-07-16 MED ORDER — APIXABAN 5 MG PO TABS: 5.0000 mg | ORAL_TABLET | Freq: Two times a day (BID) | ORAL | 2 refills | Status: DC

## 2024-07-16 NOTE — Telephone Encounter (Signed)
 Spoke to pt told him Rx's for Trazodone  and Atorvastatin was sent to pharmacy on 9/24 & 9/30, need to contact pharmacy and let them know you need a refill. Pt verbalized understanding. Told pt I will send Rx's for Methocarbamol , Eliquis  and Gabapentin  to the pharmacy. Pt verbalized understanding.

## 2024-07-16 NOTE — Telephone Encounter (Signed)
 FYI Only or Action Required?: Action required by provider: refill request, original sent 10/15.  Patient was last seen in primary care on 06/24/2024 by [name], PA.  Called Nurse Triage reporting Advice Only.  Symptoms began n/a.  Interventions attempted: Other: n/a.  Symptoms are: n/a.  Triage Disposition: Information or Advice Only Call  Patient/caregiver understands and will follow disposition?: Yes  Message from Lauren C sent at 07/16/2024  9:46 AM EDT  Reason for Triage: Eliquis  was declined a refill stating patient should contact provider first. He had last appt with provider in September. He states he has no more and this is a life threatening medication if missed.   Reason for Disposition  Caller has medicine question only, adult not sick, AND triager answers question  Answer Assessment - Initial Assessment Questions Explained that the turn around for refill requests is three days, so his refill should be complete by this Friday, 10/17   1. NAME of MEDICINE: What medicine(s) are you calling about?     Medication: apixaban  (ELIQUIS ) 5 MG TABS tablet traZODone  (DESYREL ) 100 MG tablet atorvastatin (LIPITOR) 20 MG tablet  gabapentin  (NEURONTIN ) 100 MG capsule methocarbamol  (ROBAXIN ) 500 MG tablet  2. QUESTION: What is your question? (e.g., double dose of medicine, side effect)     Patient called to check on the status of this refill request stating specifically, he only has enough Elequis to last until Friday 3. PRESCRIBER: Who prescribed the medicine? Reason: if prescribed by specialist, call should be referred to that group.     Samantha Worlen 4. SYMPTOMS: Do you have any symptoms? If Yes, ask: What symptoms are you having?  How bad are the symptoms (e.g., mild, moderate, severe)     none 5. PREGNANCY:  Is there any chance that you are pregnant? When was your last menstrual period?     N/a  Protocols used: Medication Question Call-A-AH

## 2024-07-16 NOTE — Telephone Encounter (Signed)
 Orders signed and faxed to [facility].

## 2024-07-20 ENCOUNTER — Telehealth: Payer: Self-pay | Admitting: *Deleted

## 2024-07-20 DIAGNOSIS — Z433 Encounter for attention to colostomy: Secondary | ICD-10-CM | POA: Diagnosis not present

## 2024-07-20 DIAGNOSIS — G8929 Other chronic pain: Secondary | ICD-10-CM | POA: Diagnosis not present

## 2024-07-20 DIAGNOSIS — Z7982 Long term (current) use of aspirin: Secondary | ICD-10-CM | POA: Diagnosis not present

## 2024-07-20 DIAGNOSIS — E46 Unspecified protein-calorie malnutrition: Secondary | ICD-10-CM | POA: Diagnosis not present

## 2024-07-20 DIAGNOSIS — Z79891 Long term (current) use of opiate analgesic: Secondary | ICD-10-CM | POA: Diagnosis not present

## 2024-07-20 DIAGNOSIS — Z7901 Long term (current) use of anticoagulants: Secondary | ICD-10-CM | POA: Diagnosis not present

## 2024-07-20 DIAGNOSIS — I5032 Chronic diastolic (congestive) heart failure: Secondary | ICD-10-CM | POA: Diagnosis not present

## 2024-07-20 DIAGNOSIS — T8131XD Disruption of external operation (surgical) wound, not elsewhere classified, subsequent encounter: Secondary | ICD-10-CM | POA: Diagnosis not present

## 2024-07-20 DIAGNOSIS — D649 Anemia, unspecified: Secondary | ICD-10-CM | POA: Diagnosis not present

## 2024-07-20 NOTE — Telephone Encounter (Signed)
 Copied from CRM #[id number]. Topic: Clinical - Lab/Test Results >> Jul 20, 2024  9:51 AM Precious C wrote: Reason for CRM: pt call to get bone density results, gave results per note on chart  Noted.

## 2024-07-21 ENCOUNTER — Ambulatory Visit (HOSPITAL_COMMUNITY): Admitting: Nurse Practitioner

## 2024-07-21 DIAGNOSIS — Z433 Encounter for attention to colostomy: Secondary | ICD-10-CM | POA: Diagnosis not present

## 2024-07-21 DIAGNOSIS — Z79891 Long term (current) use of opiate analgesic: Secondary | ICD-10-CM | POA: Diagnosis not present

## 2024-07-21 DIAGNOSIS — G8929 Other chronic pain: Secondary | ICD-10-CM | POA: Diagnosis not present

## 2024-07-21 DIAGNOSIS — Z7901 Long term (current) use of anticoagulants: Secondary | ICD-10-CM | POA: Diagnosis not present

## 2024-07-21 DIAGNOSIS — I5032 Chronic diastolic (congestive) heart failure: Secondary | ICD-10-CM | POA: Diagnosis not present

## 2024-07-21 DIAGNOSIS — Z7982 Long term (current) use of aspirin: Secondary | ICD-10-CM | POA: Diagnosis not present

## 2024-07-21 DIAGNOSIS — D649 Anemia, unspecified: Secondary | ICD-10-CM | POA: Diagnosis not present

## 2024-07-21 DIAGNOSIS — E46 Unspecified protein-calorie malnutrition: Secondary | ICD-10-CM | POA: Diagnosis not present

## 2024-07-21 DIAGNOSIS — T8131XD Disruption of external operation (surgical) wound, not elsewhere classified, subsequent encounter: Secondary | ICD-10-CM | POA: Diagnosis not present

## 2024-07-23 DIAGNOSIS — G8929 Other chronic pain: Secondary | ICD-10-CM | POA: Diagnosis not present

## 2024-07-23 DIAGNOSIS — Z7901 Long term (current) use of anticoagulants: Secondary | ICD-10-CM | POA: Diagnosis not present

## 2024-07-23 DIAGNOSIS — Z7982 Long term (current) use of aspirin: Secondary | ICD-10-CM | POA: Diagnosis not present

## 2024-07-23 DIAGNOSIS — I5032 Chronic diastolic (congestive) heart failure: Secondary | ICD-10-CM | POA: Diagnosis not present

## 2024-07-23 DIAGNOSIS — T8131XD Disruption of external operation (surgical) wound, not elsewhere classified, subsequent encounter: Secondary | ICD-10-CM | POA: Diagnosis not present

## 2024-07-23 DIAGNOSIS — E46 Unspecified protein-calorie malnutrition: Secondary | ICD-10-CM | POA: Diagnosis not present

## 2024-07-23 DIAGNOSIS — D649 Anemia, unspecified: Secondary | ICD-10-CM | POA: Diagnosis not present

## 2024-07-23 DIAGNOSIS — Z433 Encounter for attention to colostomy: Secondary | ICD-10-CM | POA: Diagnosis not present

## 2024-07-23 DIAGNOSIS — Z79891 Long term (current) use of opiate analgesic: Secondary | ICD-10-CM | POA: Diagnosis not present

## 2024-07-24 ENCOUNTER — Ambulatory Visit (HOSPITAL_COMMUNITY)
Admission: RE | Admit: 2024-07-24 | Discharge: 2024-07-24 | Disposition: A | Discharge status: Home / Self Care | Source: Ambulatory Visit | Attending: *Deleted | Admitting: *Deleted

## 2024-07-24 DIAGNOSIS — Z932 Ileostomy status: Secondary | ICD-10-CM | POA: Diagnosis not present

## 2024-07-24 DIAGNOSIS — Z432 Encounter for attention to ileostomy: Secondary | ICD-10-CM

## 2024-07-24 DIAGNOSIS — K9419 Other complications of enterostomy: Secondary | ICD-10-CM | POA: Insufficient documentation

## 2024-07-24 DIAGNOSIS — T8189XA Other complications of procedures, not elsewhere classified, initial encounter: Secondary | ICD-10-CM | POA: Diagnosis not present

## 2024-07-24 DIAGNOSIS — T8189XD Other complications of procedures, not elsewhere classified, subsequent encounter: Secondary | ICD-10-CM
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
## 2024-07-27 DIAGNOSIS — E46 Unspecified protein-calorie malnutrition: Secondary | ICD-10-CM | POA: Diagnosis not present

## 2024-07-27 DIAGNOSIS — T8131XD Disruption of external operation (surgical) wound, not elsewhere classified, subsequent encounter: Secondary | ICD-10-CM | POA: Diagnosis not present

## 2024-07-27 DIAGNOSIS — G8929 Other chronic pain: Secondary | ICD-10-CM | POA: Diagnosis not present

## 2024-07-27 DIAGNOSIS — D649 Anemia, unspecified: Secondary | ICD-10-CM | POA: Diagnosis not present

## 2024-07-27 DIAGNOSIS — I5032 Chronic diastolic (congestive) heart failure: Secondary | ICD-10-CM | POA: Diagnosis not present

## 2024-07-27 DIAGNOSIS — Z932 Ileostomy status: Secondary | ICD-10-CM | POA: Insufficient documentation

## 2024-07-27 DIAGNOSIS — Z7901 Long term (current) use of anticoagulants: Secondary | ICD-10-CM | POA: Diagnosis not present

## 2024-07-27 DIAGNOSIS — Z433 Encounter for attention to colostomy: Secondary | ICD-10-CM | POA: Diagnosis not present

## 2024-07-27 DIAGNOSIS — Z79891 Long term (current) use of opiate analgesic: Secondary | ICD-10-CM | POA: Diagnosis not present

## 2024-07-27 DIAGNOSIS — Z7982 Long term (current) use of aspirin: Secondary | ICD-10-CM | POA: Diagnosis not present

## 2024-07-27 NOTE — Discharge Instructions (Signed)
 No changes

## 2024-07-31 DIAGNOSIS — I5032 Chronic diastolic (congestive) heart failure: Secondary | ICD-10-CM | POA: Diagnosis not present

## 2024-07-31 DIAGNOSIS — D649 Anemia, unspecified: Secondary | ICD-10-CM | POA: Diagnosis not present

## 2024-07-31 DIAGNOSIS — T8131XD Disruption of external operation (surgical) wound, not elsewhere classified, subsequent encounter: Secondary | ICD-10-CM | POA: Diagnosis not present

## 2024-07-31 DIAGNOSIS — E46 Unspecified protein-calorie malnutrition: Secondary | ICD-10-CM | POA: Diagnosis not present

## 2024-07-31 DIAGNOSIS — Z79891 Long term (current) use of opiate analgesic: Secondary | ICD-10-CM | POA: Diagnosis not present

## 2024-07-31 DIAGNOSIS — G8929 Other chronic pain: Secondary | ICD-10-CM | POA: Diagnosis not present

## 2024-07-31 DIAGNOSIS — Z433 Encounter for attention to colostomy: Secondary | ICD-10-CM | POA: Diagnosis not present

## 2024-07-31 DIAGNOSIS — Z7982 Long term (current) use of aspirin: Secondary | ICD-10-CM | POA: Diagnosis not present

## 2024-07-31 DIAGNOSIS — Z7901 Long term (current) use of anticoagulants: Secondary | ICD-10-CM | POA: Diagnosis not present

## 2024-08-04 ENCOUNTER — Institutional Professional Consult (permissible substitution)

## 2024-08-04 ENCOUNTER — Ambulatory Visit (HOSPITAL_COMMUNITY)
Admission: RE | Admit: 2024-08-04 | Discharge: 2024-08-04 | Disposition: A | Discharge status: Home / Self Care | Source: Ambulatory Visit | Attending: Nurse Practitioner | Admitting: Nurse Practitioner

## 2024-08-04 DIAGNOSIS — Z932 Ileostomy status: Secondary | ICD-10-CM | POA: Insufficient documentation

## 2024-08-04 DIAGNOSIS — Z432 Encounter for attention to ileostomy: Secondary | ICD-10-CM | POA: Diagnosis not present
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
## 2024-08-05 DIAGNOSIS — Z932 Ileostomy status: Secondary | ICD-10-CM | POA: Diagnosis not present

## 2024-08-05 DIAGNOSIS — Z9049 Acquired absence of other specified parts of digestive tract: Secondary | ICD-10-CM | POA: Diagnosis not present

## 2024-08-05 DIAGNOSIS — Z7901 Long term (current) use of anticoagulants: Secondary | ICD-10-CM | POA: Diagnosis not present

## 2024-08-05 DIAGNOSIS — Z86711 Personal history of pulmonary embolism: Secondary | ICD-10-CM | POA: Diagnosis not present

## 2024-08-06 DIAGNOSIS — T8131XD Disruption of external operation (surgical) wound, not elsewhere classified, subsequent encounter: Secondary | ICD-10-CM | POA: Diagnosis not present

## 2024-08-06 DIAGNOSIS — Z79891 Long term (current) use of opiate analgesic: Secondary | ICD-10-CM | POA: Diagnosis not present

## 2024-08-06 DIAGNOSIS — Z7982 Long term (current) use of aspirin: Secondary | ICD-10-CM | POA: Diagnosis not present

## 2024-08-06 DIAGNOSIS — Z7901 Long term (current) use of anticoagulants: Secondary | ICD-10-CM | POA: Diagnosis not present

## 2024-08-06 DIAGNOSIS — G8929 Other chronic pain: Secondary | ICD-10-CM | POA: Diagnosis not present

## 2024-08-06 DIAGNOSIS — I5032 Chronic diastolic (congestive) heart failure: Secondary | ICD-10-CM | POA: Diagnosis not present

## 2024-08-06 DIAGNOSIS — D649 Anemia, unspecified: Secondary | ICD-10-CM | POA: Diagnosis not present

## 2024-08-06 DIAGNOSIS — Z433 Encounter for attention to colostomy: Secondary | ICD-10-CM | POA: Diagnosis not present

## 2024-08-06 DIAGNOSIS — E46 Unspecified protein-calorie malnutrition: Secondary | ICD-10-CM | POA: Diagnosis not present

## 2024-08-10 DIAGNOSIS — Z79891 Long term (current) use of opiate analgesic: Secondary | ICD-10-CM | POA: Diagnosis not present

## 2024-08-10 DIAGNOSIS — I5032 Chronic diastolic (congestive) heart failure: Secondary | ICD-10-CM | POA: Diagnosis not present

## 2024-08-10 DIAGNOSIS — T8131XD Disruption of external operation (surgical) wound, not elsewhere classified, subsequent encounter: Secondary | ICD-10-CM | POA: Diagnosis not present

## 2024-08-10 DIAGNOSIS — Z7901 Long term (current) use of anticoagulants: Secondary | ICD-10-CM | POA: Diagnosis not present

## 2024-08-10 DIAGNOSIS — Z7982 Long term (current) use of aspirin: Secondary | ICD-10-CM | POA: Diagnosis not present

## 2024-08-10 DIAGNOSIS — D649 Anemia, unspecified: Secondary | ICD-10-CM | POA: Diagnosis not present

## 2024-08-10 DIAGNOSIS — G8929 Other chronic pain: Secondary | ICD-10-CM | POA: Diagnosis not present

## 2024-08-10 DIAGNOSIS — E46 Unspecified protein-calorie malnutrition: Secondary | ICD-10-CM | POA: Diagnosis not present

## 2024-08-10 DIAGNOSIS — Z433 Encounter for attention to colostomy: Secondary | ICD-10-CM | POA: Diagnosis not present

## 2024-08-11 DIAGNOSIS — T8189XA Other complications of procedures, not elsewhere classified, initial encounter: Secondary | ICD-10-CM | POA: Diagnosis not present

## 2024-08-12 DIAGNOSIS — Z932 Ileostomy status: Secondary | ICD-10-CM | POA: Diagnosis not present

## 2024-08-13 ENCOUNTER — Ambulatory Visit (INDEPENDENT_AMBULATORY_CARE_PROVIDER_SITE_OTHER)

## 2024-08-13 VITALS — BP 120/76 | HR 63 | Ht 69.0 in | Wt 170.8 lb

## 2024-08-13 DIAGNOSIS — Z01810 Encounter for preprocedural cardiovascular examination: Secondary | ICD-10-CM

## 2024-08-13 DIAGNOSIS — T8131XD Disruption of external operation (surgical) wound, not elsewhere classified, subsequent encounter: Secondary | ICD-10-CM | POA: Diagnosis not present

## 2024-08-13 DIAGNOSIS — Z5181 Encounter for therapeutic drug level monitoring: Secondary | ICD-10-CM

## 2024-08-13 DIAGNOSIS — S31109D Unspecified open wound of abdominal wall, unspecified quadrant without penetration into peritoneal cavity, subsequent encounter: Secondary | ICD-10-CM

## 2024-08-13 DIAGNOSIS — T8189XA Other complications of procedures, not elsewhere classified, initial encounter: Secondary | ICD-10-CM | POA: Diagnosis not present
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
## 2024-08-14 ENCOUNTER — Telehealth: Payer: Self-pay

## 2024-08-14 DIAGNOSIS — Z7982 Long term (current) use of aspirin: Secondary | ICD-10-CM | POA: Diagnosis not present

## 2024-08-14 DIAGNOSIS — T8131XD Disruption of external operation (surgical) wound, not elsewhere classified, subsequent encounter: Secondary | ICD-10-CM | POA: Diagnosis not present

## 2024-08-14 DIAGNOSIS — Z433 Encounter for attention to colostomy: Secondary | ICD-10-CM | POA: Diagnosis not present

## 2024-08-14 DIAGNOSIS — E46 Unspecified protein-calorie malnutrition: Secondary | ICD-10-CM | POA: Diagnosis not present

## 2024-08-14 DIAGNOSIS — I5032 Chronic diastolic (congestive) heart failure: Secondary | ICD-10-CM | POA: Diagnosis not present

## 2024-08-14 DIAGNOSIS — D649 Anemia, unspecified: Secondary | ICD-10-CM | POA: Diagnosis not present

## 2024-08-14 DIAGNOSIS — S31109D Unspecified open wound of abdominal wall, unspecified quadrant without penetration into peritoneal cavity, subsequent encounter: Secondary | ICD-10-CM | POA: Diagnosis not present

## 2024-08-14 DIAGNOSIS — Z79891 Long term (current) use of opiate analgesic: Secondary | ICD-10-CM | POA: Diagnosis not present

## 2024-08-14 DIAGNOSIS — Z7901 Long term (current) use of anticoagulants: Secondary | ICD-10-CM | POA: Diagnosis not present

## 2024-08-14 DIAGNOSIS — G8929 Other chronic pain: Secondary | ICD-10-CM | POA: Diagnosis not present

## 2024-08-14 NOTE — Telephone Encounter (Signed)
 Faxed Prism  order for: ABD pads 4x4 Medipore tape Requested to offer patient discount price for UrgoClean.  Received confirmation: This message was sent via FAXCOM, a product from [employer]. http://www.biscom.com/                    -------Fax Transmission Report-------  To:               Recipient at [phone] Subject:          SECURED Result:           The transmission was successful. Explanation:      All Pages Ok Pages Sent:       12 Connect Time:     6 minutes, 32 seconds Transmit Time:    08/14/2024 11:58 Transfer Rate:    14400 Status Code:      0000 Retry Count:      0 Job Id:           [id number] Unique Id:        [id number] Fax Line:         76 Fax Server:       [id number]

## 2024-08-17 ENCOUNTER — Telehealth (HOSPITAL_BASED_OUTPATIENT_CLINIC_OR_DEPARTMENT_OTHER): Payer: Self-pay | Admitting: *Deleted

## 2024-08-17 DIAGNOSIS — Z433 Encounter for attention to colostomy: Secondary | ICD-10-CM | POA: Diagnosis not present

## 2024-08-17 NOTE — Telephone Encounter (Signed)
 Pt has been scheduled NEW PT APPT for preop clearance 08/19/24 with Dr. Michele. Pt has been given address for [address]   I will update all parties involved.

## 2024-08-17 NOTE — Telephone Encounter (Signed)
 Per preop APP Josefa Beauvais, FNP pt will need MD new pt appt.

## 2024-08-17 NOTE — Telephone Encounter (Signed)
    Primary Cardiologist:Paula Okey, MD  Chart reviewed as part of pre-operative protocol coverage. Because of Rhett Schellinger's past medical history and time since last visit, he/she will require a follow-up  Office visit in order to better assess preoperative cardiovascular risk.  Not a candidate for heart first.  Patient has prior cardiac history.  He will need to be seen in office as a new patient  Pre-op covering staff: - Please schedule appointment and call patient to inform them. - Please contact requesting surgeon's office via preferred method (i.e, phone, fax) to inform them of need for appointment prior to surgery.  If applicable, this message will also be routed to pharmacy pool and/or primary cardiologist for input on holding anticoagulant/antiplatelet agent as requested below so that this information is available at time of patient's appointment.   Josefa CHRISTELLA Beauvais, NP  08/17/2024, 1:24 PM

## 2024-08-17 NOTE — Telephone Encounter (Signed)
   Pre-operative Risk Assessment    Patient Name: Alexander [name]  DOB: 04-Nov-1960 MRN: [id number]   Date of last office visit: 10/19/19 DR. ROSS Date of next office visit: NONE-PT WILL NEED NEW PT APPT; WILL CONFIRM WITH PREOP APP IF MD NEW PT OR HEART 1ST PROVIDER    Request for Surgical Clearance    Procedure:  SKIN GRAFT TO ABDOMINAL WALL WITH APPLICATION OF SKIN SUBSTITUTE TO DONOR SITE, ALONG WITH WOUND VAC TO ABDOMEN  Date of Surgery:  Clearance TBD                                Surgeon: DR. NIEVES CLIENT Surgeon's Group or Practice Name:  [facility] Phone number:  [phone]; BEECHERBETHA RANDINE HIDDEN, CMA Fax number:  [phone]   Type of Clearance Requested:   - Medical  - Pharmacy:  Hold Apixaban  (Eliquis )     Type of Anesthesia:  General    Additional requests/questions:    [name]   08/17/2024, 9:11 AM

## 2024-08-18 ENCOUNTER — Ambulatory Visit (HOSPITAL_COMMUNITY)
Admission: RE | Admit: 2024-08-18 | Discharge: 2024-08-18 | Disposition: A | Discharge status: Home / Self Care | Source: Ambulatory Visit | Attending: Physician Assistant | Admitting: Physician Assistant

## 2024-08-18 DIAGNOSIS — Z4801 Encounter for change or removal of surgical wound dressing: Secondary | ICD-10-CM | POA: Insufficient documentation

## 2024-08-18 DIAGNOSIS — Z432 Encounter for attention to ileostomy: Secondary | ICD-10-CM | POA: Diagnosis not present

## 2024-08-18 DIAGNOSIS — Z86718 Personal history of other venous thrombosis and embolism: Secondary | ICD-10-CM | POA: Diagnosis not present

## 2024-08-18 DIAGNOSIS — Y833 Surgical operation with formation of external stoma as the cause of abnormal reaction of the patient, or of later complication, without mention of misadventure at the time of the procedure: Secondary | ICD-10-CM | POA: Insufficient documentation

## 2024-08-18 DIAGNOSIS — L24B3 Irritant contact dermatitis related to fecal or urinary stoma or fistula: Secondary | ICD-10-CM | POA: Diagnosis not present

## 2024-08-18 DIAGNOSIS — I10 Essential (primary) hypertension: Secondary | ICD-10-CM | POA: Insufficient documentation

## 2024-08-18 DIAGNOSIS — K5790 Diverticulosis of intestine, part unspecified, without perforation or abscess without bleeding: Secondary | ICD-10-CM | POA: Insufficient documentation

## 2024-08-18 DIAGNOSIS — T8189XA Other complications of procedures, not elsewhere classified, initial encounter: Secondary | ICD-10-CM | POA: Insufficient documentation

## 2024-08-18 DIAGNOSIS — T8189XD Other complications of procedures, not elsewhere classified, subsequent encounter: Secondary | ICD-10-CM

## 2024-08-18 DIAGNOSIS — R5381 Other malaise: Secondary | ICD-10-CM | POA: Insufficient documentation

## 2024-08-18 DIAGNOSIS — Z9049 Acquired absence of other specified parts of digestive tract: Secondary | ICD-10-CM | POA: Diagnosis not present
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
## 2024-08-19 ENCOUNTER — Encounter: Payer: Self-pay | Admitting: Cardiology

## 2024-08-19 ENCOUNTER — Ambulatory Visit: Attending: Cardiology | Admitting: Cardiology

## 2024-08-19 VITALS — BP 111/72 | HR 96 | Resp 16 | Ht 69.0 in | Wt 171.0 lb

## 2024-08-19 DIAGNOSIS — Z86711 Personal history of pulmonary embolism: Secondary | ICD-10-CM | POA: Diagnosis not present

## 2024-08-19 DIAGNOSIS — I10 Essential (primary) hypertension: Secondary | ICD-10-CM

## 2024-08-19 DIAGNOSIS — I700 Atherosclerosis of aorta: Secondary | ICD-10-CM | POA: Diagnosis not present

## 2024-08-19 DIAGNOSIS — Z86718 Personal history of other venous thrombosis and embolism: Secondary | ICD-10-CM | POA: Diagnosis not present

## 2024-08-19 DIAGNOSIS — Z01818 Encounter for other preprocedural examination: Secondary | ICD-10-CM
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
## 2024-08-19 NOTE — Patient Instructions (Addendum)
 Medication Instructions:  Your physician recommends that you continue on your current medications as directed. Please refer to the Current Medication list given to you today.  *If you need a refill on your cardiac medications before your next appointment, please call your pharmacy*  Lab Work: None ordered If you have labs (blood work) drawn today and your tests are completely normal, you will receive your results only by: MyChart Message (if you have MyChart) OR A paper copy in the mail If you have any lab test that is abnormal or we need to change your treatment, we will call you to review the results.  Testing/Procedures: ECHOCARDIOGRAM  LEXISCAN   Follow-Up: At [facility], you and your health needs are our priority.  As part of our continuing mission to provide you with exceptional heart care, our providers are all part of one team.  This team includes your primary Cardiologist (physician) and Advanced Practice Providers or APPs (Physician Assistants and Nurse Practitioners) who all work together to provide you with the care you need, when you need it.  Your next appointment:   3 month(s)  Provider:   Any APP   We recommend signing up for the patient portal called MyChart.  Sign up information is provided on this After Visit Summary.  MyChart is used to connect with patients for Virtual Visits (Telemedicine).  Patients are able to view lab/test results, encounter notes, upcoming appointments, etc.  Non-urgent messages can be sent to your provider as well.   To learn more about what you can do with MyChart, go to [email or web address].   Other Instructions  Follow up with your PCP concerning restarting your medications pertaining to your co-morbidities.  Your physician has requested that you have an echocardiogram. Echocardiography is a painless test that uses sound waves to create images of your heart. It provides your doctor with information about the size and shape  of your heart and how well your heart's chambers and valves are working. This procedure takes approximately one hour. There are no restrictions for this procedure. Please do NOT wear cologne, perfume, aftershave, or lotions (deodorant is allowed). Please arrive 15 minutes prior to your appointment time.  Please note: We ask at that you not bring children with you during ultrasound (echo/ vascular) testing. Due to room size and safety concerns, children are not allowed in the ultrasound rooms during exams. Our front office staff cannot provide observation of children in our lobby area while testing is being conducted. An adult accompanying a patient to their appointment will only be allowed in the ultrasound room at the discretion of the ultrasound technician under special circumstances. We apologize for any inconvenience.       You are scheduled for a Myocardial Perfusion Imaging Study on:  ___ at ___.  Please arrive 15 minutes prior to your appointment time for registration and insurance purposes.  The test will take approximately 3 to 4 hours to complete; you may bring reading material.  If someone comes with you to your appointment, they will need to remain in the main lobby due to limited space in the testing area. **If you are pregnant or breastfeeding, please notify the nuclear lab prior to your appointment**  How to prepare for your Myocardial Perfusion Test: Do not eat or drink 3 hours prior to your test, except you may have water . Do not consume products containing caffeine (regular or decaffeinated) 12 hours prior to your test. (ex: coffee, chocolate, sodas, tea). Do bring a list of  your current medications with you.  If not listed below, you may take your medications as normal. Do wear comfortable clothes (no dresses or overalls) and walking shoes, tennis shoes preferred (No heels or open toe shoes are allowed). Do NOT wear cologne, perfume, aftershave, or lotions (deodorant is  allowed). If these instructions are not followed, your test will have to be rescheduled.

## 2024-08-20 ENCOUNTER — Ambulatory Visit (INDEPENDENT_AMBULATORY_CARE_PROVIDER_SITE_OTHER)

## 2024-08-20 ENCOUNTER — Telehealth: Payer: Self-pay | Admitting: *Deleted

## 2024-08-20 ENCOUNTER — Telehealth (HOSPITAL_COMMUNITY): Payer: Self-pay | Admitting: *Deleted

## 2024-08-20 ENCOUNTER — Other Ambulatory Visit: Payer: Self-pay | Admitting: Cardiology

## 2024-08-20 VITALS — BP 138/70 | HR 85 | Ht 69.0 in | Wt 171.0 lb

## 2024-08-20 DIAGNOSIS — S31109D Unspecified open wound of abdominal wall, unspecified quadrant without penetration into peritoneal cavity, subsequent encounter: Secondary | ICD-10-CM

## 2024-08-20 DIAGNOSIS — Z01818 Encounter for other preprocedural examination: Secondary | ICD-10-CM

## 2024-08-20 NOTE — Telephone Encounter (Signed)
 Patient given detailed instructions per Myocardial Perfusion Study Information Sheet for the test on 08/24/2024 at 1:00. Patient notified to arrive 15 minutes early and that it is imperative to arrive on time for appointment to keep from having the test rescheduled.  If you need to cancel or reschedule your appointment, please call the office within 24 hours of your appointment. . Patient verbalized understanding.Alexander [name]

## 2024-08-20 NOTE — Telephone Encounter (Signed)
 Left message on voicemail to call office.

## 2024-08-20 NOTE — Telephone Encounter (Signed)
 Per Dr.Tolia, who saw pt yesterday as new pt; will need some cardiac testing before being cleared. Once the pt has been cleared by Dr. Michele he will have his nurse/cma fax over the clearance notes.
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
## 2024-08-21 ENCOUNTER — Other Ambulatory Visit: Payer: Self-pay

## 2024-08-23 NOTE — Addendum Note (Signed)
 Addended by: MICHELE MADONNA GRADE on: 08/23/2024 05:50 PM   Modules accepted: Orders

## 2024-08-24 ENCOUNTER — Ambulatory Visit (HOSPITAL_COMMUNITY)
Admission: RE | Admit: 2024-08-24 | Discharge: 2024-08-24 | Disposition: A | Discharge status: Home / Self Care | Source: Ambulatory Visit | Attending: Cardiovascular Disease | Admitting: Cardiovascular Disease

## 2024-08-24 DIAGNOSIS — Z01818 Encounter for other preprocedural examination: Secondary | ICD-10-CM | POA: Insufficient documentation

## 2024-08-24 LAB — MYOCARDIAL PERFUSION IMAGING
Base ST Depression (mm): 0 mm
LV dias vol: 95 mL (ref 62–150)
LV sys vol: 38 mL (ref 4.2–5.8)
Nuc Stress EF: 60 %
Peak HR: 90 {beats}/min
Rest HR: 68 {beats}/min
Rest Nuclear Isotope Dose: 11 mCi
SDS: 3
SRS: 0
SSS: 3
ST Depression (mm): 0 mm
Stress Nuclear Isotope Dose: 32.9 mCi
TID: 1.05

## 2024-08-24 MED ORDER — TECHNETIUM TC 99M TETROFOSMIN IV KIT
32.9000 | PACK | Freq: Once | INTRAVENOUS | Status: AC | PRN
  Administered 2024-08-24: 32.9 via INTRAVENOUS

## 2024-08-24 MED ORDER — REGADENOSON 0.4 MG/5ML IV SOLN
INTRAVENOUS | Status: AC
  Filled 2024-08-24: qty 5

## 2024-08-24 MED ORDER — REGADENOSON 0.4 MG/5ML IV SOLN
0.4000 mg | Freq: Once | INTRAVENOUS | Status: AC
  Administered 2024-08-24: 0.4 mg via INTRAVENOUS

## 2024-08-24 MED ORDER — TECHNETIUM TC 99M TETROFOSMIN IV KIT
11.0000 | PACK | Freq: Once | INTRAVENOUS | Status: AC | PRN
  Administered 2024-08-24: 11 via INTRAVENOUS

## 2024-08-25 ENCOUNTER — Ambulatory Visit (INDEPENDENT_AMBULATORY_CARE_PROVIDER_SITE_OTHER): Admitting: Physician Assistant

## 2024-08-25 ENCOUNTER — Encounter: Payer: Self-pay | Admitting: Physician Assistant

## 2024-08-25 ENCOUNTER — Ambulatory Visit: Admission: RE | Admit: 2024-08-25 | Source: Home / Self Care

## 2024-08-25 ENCOUNTER — Encounter: Admission: RE | Payer: Self-pay | Source: Home / Self Care

## 2024-08-25 VITALS — BP 130/70 | HR 85 | Temp 98.1°F | Ht 69.0 in | Wt 173.0 lb

## 2024-08-25 DIAGNOSIS — T8189XD Other complications of procedures, not elsewhere classified, subsequent encounter: Secondary | ICD-10-CM

## 2024-08-25 DIAGNOSIS — T8189XA Other complications of procedures, not elsewhere classified, initial encounter: Secondary | ICD-10-CM | POA: Diagnosis not present

## 2024-08-25 DIAGNOSIS — G894 Chronic pain syndrome: Secondary | ICD-10-CM | POA: Diagnosis not present

## 2024-08-25 DIAGNOSIS — F5101 Primary insomnia: Secondary | ICD-10-CM | POA: Diagnosis not present

## 2024-08-25 DIAGNOSIS — Z7901 Long term (current) use of anticoagulants: Secondary | ICD-10-CM

## 2024-08-25 SURGERY — APPLICATION, GRAFT, SKIN, SPLIT-THICKNESS
Anesthesia: Choice | Site: Abdomen

## 2024-08-25 MED ORDER — TRAZODONE HCL 100 MG PO TABS: 100.0000 mg | ORAL_TABLET | Freq: Every day | ORAL | 1 refills | Status: AC

## 2024-08-25 MED ORDER — GABAPENTIN 300 MG PO CAPS: 300.0000 mg | ORAL_CAPSULE | Freq: Three times a day (TID) | ORAL | 1 refills | Status: AC

## 2024-08-25 NOTE — Telephone Encounter (Signed)
 Pt here today will discuss surgery clearance
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
## 2024-08-25 NOTE — Patient Instructions (Signed)
 It was great to see you!  Good luck with all your testing!  See upcoming appointments below  I have sent in gabapentin  -- start 300 mg nightly, may increase to three times daily if needed  Let's follow-up in 3 months, sooner if you have concerns.  Take care,  Lucie Buttner PA-C

## 2024-08-28 ENCOUNTER — Encounter: Payer: Self-pay | Admitting: Cardiology

## 2024-08-28 ENCOUNTER — Ambulatory Visit: Payer: Self-pay | Admitting: Cardiology

## 2024-08-31 NOTE — Telephone Encounter (Signed)
 Spoke with patient. Relayed information on Dr. Tyree cardiac clearance letter. Patient had no questions.

## 2024-09-02 ENCOUNTER — Encounter

## 2024-09-04 ENCOUNTER — Ambulatory Visit (HOSPITAL_COMMUNITY)
Admission: RE | Admit: 2024-09-04 | Discharge: 2024-09-04 | Disposition: A | Source: Ambulatory Visit | Attending: *Deleted | Admitting: *Deleted

## 2024-09-04 DIAGNOSIS — T8189XA Other complications of procedures, not elsewhere classified, initial encounter: Secondary | ICD-10-CM | POA: Diagnosis not present

## 2024-09-04 DIAGNOSIS — Z932 Ileostomy status: Secondary | ICD-10-CM | POA: Diagnosis not present

## 2024-09-04 DIAGNOSIS — L24B3 Irritant contact dermatitis related to fecal or urinary stoma or fistula: Secondary | ICD-10-CM

## 2024-09-04 DIAGNOSIS — Z432 Encounter for attention to ileostomy: Secondary | ICD-10-CM | POA: Diagnosis not present

## 2024-09-04 DIAGNOSIS — Y838 Other surgical procedures as the cause of abnormal reaction of the patient, or of later complication, without mention of misadventure at the time of the procedure: Secondary | ICD-10-CM | POA: Diagnosis not present

## 2024-09-04 DIAGNOSIS — T8189XD Other complications of procedures, not elsewhere classified, subsequent encounter: Secondary | ICD-10-CM | POA: Diagnosis not present
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
## 2024-09-10 ENCOUNTER — Ambulatory Visit (HOSPITAL_COMMUNITY): Admission: RE | Admit: 2024-09-10 | Source: Ambulatory Visit | Attending: Cardiology | Admitting: Cardiology

## 2024-09-10 ENCOUNTER — Ambulatory Visit (HOSPITAL_COMMUNITY)
Admission: RE | Admit: 2024-09-10 | Discharge: 2024-09-10 | Disposition: A | Discharge status: Home / Self Care | Source: Ambulatory Visit | Attending: Cardiology | Admitting: Cardiology

## 2024-09-10 DIAGNOSIS — Z01818 Encounter for other preprocedural examination: Secondary | ICD-10-CM | POA: Diagnosis not present

## 2024-09-10 DIAGNOSIS — Z01810 Encounter for preprocedural cardiovascular examination: Secondary | ICD-10-CM

## 2024-09-10 LAB — ECHOCARDIOGRAM COMPLETE
Area-P 1/2: 3.73 cm2
P 1/2 time: 328 ms
S' Lateral: 2.7 cm

## 2024-09-11 ENCOUNTER — Inpatient Hospital Stay (HOSPITAL_COMMUNITY): Admission: RE | Admit: 2024-09-11 | Discharge: 2024-09-11 | Attending: *Deleted | Admitting: *Deleted

## 2024-09-11 DIAGNOSIS — Z932 Ileostomy status: Secondary | ICD-10-CM

## 2024-09-11 DIAGNOSIS — T8189XD Other complications of procedures, not elsewhere classified, subsequent encounter: Secondary | ICD-10-CM
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
## 2024-09-14 ENCOUNTER — Inpatient Hospital Stay

## 2024-09-14 VITALS — BP 136/77 | HR 86 | Temp 97.2°F | Resp 16 | Ht 69.0 in | Wt 187.9 lb

## 2024-09-14 DIAGNOSIS — I700 Atherosclerosis of aorta: Secondary | ICD-10-CM | POA: Insufficient documentation

## 2024-09-14 DIAGNOSIS — M129 Arthropathy, unspecified: Secondary | ICD-10-CM | POA: Insufficient documentation

## 2024-09-14 DIAGNOSIS — I739 Peripheral vascular disease, unspecified: Secondary | ICD-10-CM | POA: Diagnosis not present

## 2024-09-14 DIAGNOSIS — M47814 Spondylosis without myelopathy or radiculopathy, thoracic region: Secondary | ICD-10-CM | POA: Diagnosis not present

## 2024-09-14 DIAGNOSIS — Z86711 Personal history of pulmonary embolism: Secondary | ICD-10-CM | POA: Diagnosis not present

## 2024-09-14 DIAGNOSIS — I10 Essential (primary) hypertension: Secondary | ICD-10-CM | POA: Diagnosis not present

## 2024-09-14 DIAGNOSIS — I272 Pulmonary hypertension, unspecified: Secondary | ICD-10-CM | POA: Diagnosis not present

## 2024-09-14 DIAGNOSIS — Z86718 Personal history of other venous thrombosis and embolism: Secondary | ICD-10-CM | POA: Diagnosis present

## 2024-09-14 DIAGNOSIS — Z79899 Other long term (current) drug therapy: Secondary | ICD-10-CM | POA: Insufficient documentation

## 2024-09-14 DIAGNOSIS — K219 Gastro-esophageal reflux disease without esophagitis: Secondary | ICD-10-CM | POA: Diagnosis not present

## 2024-09-14 DIAGNOSIS — Z809 Family history of malignant neoplasm, unspecified: Secondary | ICD-10-CM | POA: Insufficient documentation

## 2024-09-14 DIAGNOSIS — I2510 Atherosclerotic heart disease of native coronary artery without angina pectoris: Secondary | ICD-10-CM | POA: Insufficient documentation

## 2024-09-14 DIAGNOSIS — Z7901 Long term (current) use of anticoagulants: Secondary | ICD-10-CM | POA: Insufficient documentation

## 2024-09-14 NOTE — Assessment & Plan Note (Addendum)
 Tolerating apixaban  well. May stop apixaban  2 days before surgery.  Planning on resuming apixaban  2 days after surgery as long as no bleeding or dehiscence complications If new concerning symptoms, may report to ED for evaluation.  He understands due to personal experience of PE in the past. Patient will continue follow-up with PCP for anticoagulation refill in the future

## 2024-09-16 ENCOUNTER — Telehealth: Payer: Self-pay

## 2024-09-16 NOTE — Telephone Encounter (Signed)
 Spoke with the patient made him aware that Dr Tina spoke with his surgeon 2 days ago about his medication. Explained to stop 2 days before and restart 2 days after unless he had complications. Earney Arland Legions BSN RN

## 2024-09-18 ENCOUNTER — Encounter: Admitting: Student

## 2024-09-18 ENCOUNTER — Other Ambulatory Visit (HOSPITAL_COMMUNITY): Payer: Self-pay | Admitting: Nurse Practitioner

## 2024-09-18 ENCOUNTER — Ambulatory Visit (HOSPITAL_COMMUNITY)
Admission: RE | Admit: 2024-09-18 | Discharge: 2024-09-18 | Disposition: A | Discharge status: Home / Self Care | Source: Ambulatory Visit | Attending: Nurse Practitioner | Admitting: Nurse Practitioner

## 2024-09-18 DIAGNOSIS — Z932 Ileostomy status: Secondary | ICD-10-CM | POA: Insufficient documentation

## 2024-09-18 DIAGNOSIS — Y838 Other surgical procedures as the cause of abnormal reaction of the patient, or of later complication, without mention of misadventure at the time of the procedure: Secondary | ICD-10-CM | POA: Insufficient documentation

## 2024-09-18 DIAGNOSIS — T8189XA Other complications of procedures, not elsewhere classified, initial encounter: Secondary | ICD-10-CM | POA: Diagnosis not present

## 2024-09-18 DIAGNOSIS — T8189XD Other complications of procedures, not elsewhere classified, subsequent encounter: Secondary | ICD-10-CM

## 2024-09-18 DIAGNOSIS — Z432 Encounter for attention to ileostomy: Secondary | ICD-10-CM | POA: Diagnosis not present
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
## 2024-09-21 ENCOUNTER — Inpatient Hospital Stay

## 2024-09-22 ENCOUNTER — Ambulatory Visit (HOSPITAL_COMMUNITY)
Admission: RE | Admit: 2024-09-22 | Discharge: 2024-09-22 | Disposition: A | Discharge status: Home / Self Care | Source: Ambulatory Visit | Attending: Plastic Surgery | Admitting: Plastic Surgery

## 2024-09-22 VITALS — BP 121/71 | HR 86 | Temp 97.9°F | Resp 20

## 2024-09-22 DIAGNOSIS — Z432 Encounter for attention to ileostomy: Secondary | ICD-10-CM | POA: Insufficient documentation

## 2024-09-22 DIAGNOSIS — L24B3 Irritant contact dermatitis related to fecal or urinary stoma or fistula: Secondary | ICD-10-CM | POA: Insufficient documentation

## 2024-09-22 DIAGNOSIS — T81321D Disruption or dehiscence of closure of internal operation (surgical) wound of abdominal wall muscle or fascia, subsequent encounter: Secondary | ICD-10-CM | POA: Insufficient documentation

## 2024-09-22 DIAGNOSIS — T8189XD Other complications of procedures, not elsewhere classified, subsequent encounter: Secondary | ICD-10-CM | POA: Insufficient documentation
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
## 2024-09-22 NOTE — Discharge Instructions (Signed)
 Wound culture performed today.  Daily silver hydrofiber dressing to wound bed after cleansing with VASHE and allowing to air dry a few seconds.  COver with gauze and ABD pad and tape.  No changes to ileostomy care.

## 2024-09-23 ENCOUNTER — Other Ambulatory Visit (HOSPITAL_COMMUNITY): Payer: Self-pay | Admitting: Nurse Practitioner

## 2024-09-23 DIAGNOSIS — T8189XD Other complications of procedures, not elsewhere classified, subsequent encounter: Secondary | ICD-10-CM

## 2024-09-25 LAB — AEROBIC/ANAEROBIC CULTURE W GRAM STAIN (SURGICAL/DEEP WOUND)
Culture: UNDETERMINED
Gram Stain: NONE SEEN
Special Requests: NORMAL

## 2024-09-29 ENCOUNTER — Ambulatory Visit (HOSPITAL_COMMUNITY)
Admission: RE | Admit: 2024-09-29 | Discharge: 2024-09-29 | Disposition: A | Discharge status: Home / Self Care | Source: Ambulatory Visit | Attending: *Deleted | Admitting: *Deleted

## 2024-09-29 DIAGNOSIS — L24B3 Irritant contact dermatitis related to fecal or urinary stoma or fistula: Secondary | ICD-10-CM

## 2024-09-29 DIAGNOSIS — T8189XD Other complications of procedures, not elsewhere classified, subsequent encounter: Secondary | ICD-10-CM | POA: Diagnosis not present

## 2024-09-29 DIAGNOSIS — Z932 Ileostomy status: Secondary | ICD-10-CM | POA: Diagnosis present

## 2024-09-29 DIAGNOSIS — Z432 Encounter for attention to ileostomy: Secondary | ICD-10-CM | POA: Diagnosis not present
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
## 2024-10-02 ENCOUNTER — Ambulatory Visit (HOSPITAL_COMMUNITY)

## 2024-10-02 NOTE — Discharge Instructions (Signed)
 COnitnue VASHE soaks and Aquacel to wound bed. NO changes in pouching, stable.  Upcoming appointment with plastic surgeon .

## 2024-10-04 ENCOUNTER — Encounter (HOSPITAL_COMMUNITY): Payer: Self-pay | Admitting: Pharmacy Technician

## 2024-10-04 ENCOUNTER — Other Ambulatory Visit: Payer: Self-pay

## 2024-10-04 ENCOUNTER — Emergency Department (HOSPITAL_COMMUNITY)
Admission: EM | Admit: 2024-10-04 | Discharge: 2024-10-04 | Disposition: A | Discharge status: Home / Self Care | Attending: Emergency Medicine | Admitting: Emergency Medicine

## 2024-10-04 DIAGNOSIS — Z7901 Long term (current) use of anticoagulants: Secondary | ICD-10-CM | POA: Diagnosis not present

## 2024-10-04 DIAGNOSIS — I10 Essential (primary) hypertension: Secondary | ICD-10-CM | POA: Diagnosis not present

## 2024-10-04 DIAGNOSIS — Z432 Encounter for attention to ileostomy: Secondary | ICD-10-CM | POA: Insufficient documentation

## 2024-10-04 DIAGNOSIS — Z7189 Other specified counseling: Secondary | ICD-10-CM

## 2024-10-04 MED ORDER — ACETAMINOPHEN 325 MG PO TABS
650.0000 mg | ORAL_TABLET | Freq: Once | ORAL | Status: AC
  Administered 2024-10-04: 650 mg via ORAL
  Filled 2024-10-04: qty 2

## 2024-10-04 NOTE — Discharge Instructions (Signed)
 Continue with scheduled appointment at ostomy clinic tomorrow. Please return to the ED if you develop any new or concerning symptoms.

## 2024-10-04 NOTE — ED Triage Notes (Signed)
 Pt here with reports of leaking from his colostomy bag. Unable to get a good seal.

## 2024-10-04 NOTE — ED Provider Notes (Signed)
 " [place] EM[facility] DEPARTMENT AT [facility] Provider Note   CSN: [id number] Arrival date & time: 10/04/24  9184     Patient presents with: Colostomy Problem   [name] is a 64 y.o. male.  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   Patient is here requesting change of his ostomy bag.  He states that it is leaking from the bottom of the seal.  He is scheduled to follow-up in ostomy clinic tomorrow however, I cannot wait that long.  Denies fevers, abdominal pain, change in output from ostomy.  Patient goes on to say, I am not leaving here until it is put on correctly and not leaking.  Patient states he has had the ostomy as well as a wound to his mid abdomen that is currently dressed since April. Patient denies any concern with abdominal wound as it is also being followed by his general surgeon.  The history is provided by the patient.       Prior to Admission medications  Medication Sig Start Date End Date Taking? Authorizing Provider  apixaban  (ELIQUIS ) 5 MG TABS tablet Take 1 tablet (5 mg total) by mouth 2 (two) times daily. 07/16/24   [name], PA  gabapentin  (NEURONTIN ) 300 MG capsule Take 1 capsule (300 mg total) by mouth 3 (three) times daily. 08/25/24   [name], PA  traZODone  (DESYREL ) 100 MG tablet Take 1 tablet (100 mg total) by mouth at bedtime. 08/25/24   [name], PA    Allergies: Patient has no known allergies.    Review of Systems  Skin:  Positive for wound.    Updated Vital Signs BP 127/80 (BP Location: Right Arm)   Pulse 80   Temp 97.9 F (36.6 C) (Oral)   Resp 18   SpO2 100%   Physical Exam Vitals and nursing note reviewed.  Constitutional:      Appearance: Normal appearance.  HENT:     Head: Normocephalic and atraumatic.     Nose: Nose normal.     Mouth/Throat:     Mouth: Mucous membranes are  moist.  Eyes:     General: No scleral icterus.    Extraocular Movements: Extraocular movements intact.     Conjunctiva/sclera: Conjunctivae normal.  Cardiovascular:     Rate and Rhythm: Normal rate and regular rhythm.     Pulses: Normal pulses.     Heart sounds: Normal heart sounds.  Pulmonary:     Effort: Pulmonary effort is normal. No respiratory distress.     Breath sounds: Normal breath sounds. No stridor. No wheezing, rhonchi or rales.  Abdominal:     General: Abdomen is flat. Bowel sounds are normal. There is no distension.     Palpations: Abdomen is soft.     Tenderness: There is no guarding.     Comments: Ostomy in RLQ with normal output. Mid-abdomen bandaged. See picture below and in media tab for abdomen uncovered during ostomy change. Skin degradation below the stoma leading to patient's inability to properly seal his ostomy bag. No evidence of infection associated with abdominal wound.  Musculoskeletal:        General: Normal range of motion.  Skin:    General: Skin is warm and dry.     Capillary Refill: Capillary refill takes less than 2 seconds.     Coloration: Skin is not jaundiced or pale.  Neurological:     Mental Status: He  is alert and oriented to person, place, and time.      Media Information  Document Information  Photographic Image: Photos    10/04/2024 12:08  Attached To:  Hospital Encounter on 10/04/24  Source Information  Rosina Almarie LABOR, PA-C  [facility]   (all labs ordered are listed, but only abnormal results are displayed) Labs Reviewed - No data to display  EKG: None  Radiology: No results found.   Procedures   Medications Ordered in the ED  acetaminophen  (TYLENOL ) tablet 650 mg (650 mg Oral Given 10/04/24 1141)     Patient presents to the ED for concern of ostomy care, this involves an extensive number of treatment options, and is a complaint that carries with it a high risk of complications and morbidity.  The  differential diagnosis includes ostomy care, infection.   Co morbidities that complicate the patient evaluation  HTN Ileostomy present   Additional history obtained:  Additional history obtained from Outside Medical Records   External records from outside source obtained and reviewed including previous media images of abdominal wound, ostomy clinic visit.   Problem List / ED Course:     Ostomy care. Patient requesting help with ostomy care as he has been unable to properly seal the bottom of his ostomy bag due to skin degradation. Patient was assisted in placement of ostomy bag. He has an appointment in outpatient ostomy clinic tomorrow. Return precautions discussed and patient verbalized understanding. Stable for discharge. Abdominal wound. Patient has no concerns regarding this. Wound appears stable in comparison with media photo by ostomy clinic on 12/30.    Reevaluation:  After the interventions noted above, I reevaluated the patient and found that they have :improved   Dispostion:  After consideration of the diagnostic results and the patients response to treatment, I feel that the patent would benefit from supportive care in the home setting and close follow up with ostomy care clinic. Return precautions discussed.                                   Medical Decision Making Risk OTC drugs.   This note was produced using [employer] Engineer. While the provider has reviewed and verified all clinical information, transcription errors may remain.    Final diagnoses:  Encounter for ostomy care education    ED Discharge Orders     None          Rosina Almarie LABOR, [place] 10/04/24 1558  "

## 2024-10-05 ENCOUNTER — Ambulatory Visit (HOSPITAL_COMMUNITY)
Admission: RE | Admit: 2024-10-05 | Discharge: 2024-10-05 | Disposition: A | Discharge status: Home / Self Care | Source: Ambulatory Visit | Attending: Physician Assistant | Admitting: Physician Assistant

## 2024-10-05 DIAGNOSIS — L24B3 Irritant contact dermatitis related to fecal or urinary stoma or fistula: Secondary | ICD-10-CM | POA: Diagnosis not present

## 2024-10-05 DIAGNOSIS — T8189XD Other complications of procedures, not elsewhere classified, subsequent encounter: Secondary | ICD-10-CM

## 2024-10-05 DIAGNOSIS — Z932 Ileostomy status: Secondary | ICD-10-CM | POA: Diagnosis not present
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
## 2024-10-08 ENCOUNTER — Telehealth: Payer: Self-pay

## 2024-10-08 ENCOUNTER — Ambulatory Visit (INDEPENDENT_AMBULATORY_CARE_PROVIDER_SITE_OTHER): Admitting: Physician Assistant

## 2024-10-08 VITALS — BP 115/70 | HR 92 | Ht 69.0 in | Wt 194.6 lb

## 2024-10-08 DIAGNOSIS — S31109D Unspecified open wound of abdominal wall, unspecified quadrant without penetration into peritoneal cavity, subsequent encounter: Secondary | ICD-10-CM

## 2024-10-08 MED ORDER — OXYCODONE-ACETAMINOPHEN 5-325 MG PO TABS: 1.0000 | ORAL_TABLET | Freq: Three times a day (TID) | ORAL | 0 refills | Status: DC | PRN

## 2024-10-08 NOTE — Telephone Encounter (Signed)
 Transition Care Management Unsuccessful Follow-up Telephone Call  Date of discharge and from where:  10/04/24 [facility]  Attempts:  1st Attempt  Reason for unsuccessful TCM follow-up call:  Left voice message; Lvm for pt to complete TOC call and check on how pt is doing since discharge. Pt completed ED follow up [facility]. Advised office cb number if needing to return my call.
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
## 2024-10-12 ENCOUNTER — Ambulatory Visit (HOSPITAL_BASED_OUTPATIENT_CLINIC_OR_DEPARTMENT_OTHER)
Admission: RE | Admit: 2024-10-12 | Discharge: 2024-10-12 | Disposition: A | Discharge status: Home / Self Care | Source: Ambulatory Visit | Attending: *Deleted | Admitting: *Deleted

## 2024-10-12 ENCOUNTER — Other Ambulatory Visit: Payer: Self-pay

## 2024-10-12 ENCOUNTER — Encounter (HOSPITAL_COMMUNITY): Payer: Self-pay

## 2024-10-12 DIAGNOSIS — Z932 Ileostomy status: Secondary | ICD-10-CM | POA: Insufficient documentation

## 2024-10-12 DIAGNOSIS — Z432 Encounter for attention to ileostomy: Secondary | ICD-10-CM | POA: Diagnosis not present

## 2024-10-12 DIAGNOSIS — T8189XD Other complications of procedures, not elsewhere classified, subsequent encounter: Secondary | ICD-10-CM

## 2024-10-12 DIAGNOSIS — L24B3 Irritant contact dermatitis related to fecal or urinary stoma or fistula: Secondary | ICD-10-CM

## 2024-10-12 NOTE — Progress Notes (Signed)
 SDW call  Patient was given pre-op instructions over the phone. Patient verbalized understanding of instructions provided.  Denies any SOB, fever or cough   PCP - Lucie Buttner, PA Cardiologist - Dr. Madonna Large, clearance 09/10/2024 Hematology: Dr. Ronell, hold Eliquis  2 days Pulmonary:    PPM/ICD - denies Device Orders - na Rep Notified - na   Chest x-ray - 05/27/2024 EKG -  08/19/2024 Stress Test - ECHO - 09/10/2024 Cardiac Cath -   Sleep Study/sleep apnea/CPAP: denies  Non-diabetic  Blood Thinner Instructions: Eliquis , hold 2 days, states last dose 10/10/2024 Aspirin  Instructions:denies   ERAS Protcol - NPO   Anesthesia review: Yes. DIC/code blue, ostomy, DVT, PE, HTN, PAD on Eliquis   Your procedure is scheduled on Tuesday October 14, 2024  Report to [facility] Main Entrance A at  1045  A.M., then check in with the Admitting office.  Call this number if you have problems the morning of surgery:  [phone]   If you have any questions prior to your surgery date call [phone]: Open Monday-Friday 8am-4pm If you experience any cold or flu symptoms such as cough, fever, chills, shortness of breath, etc. between now and your scheduled surgery, please notify us  at the above number    Remember:  Do not eat or drink after midnight the night before your surgery Take these medicines the morning of surgery with A SIP OF WATER :  Gabapentin   As needed: Tylenol  or percocet  As of today, STOP taking any Aspirin  (unless otherwise instructed by your surgeon) Aleve , Naproxen , Ibuprofen , Motrin , Advil , Goody's, BC's, all herbal medications, fish oil, and all vitamins.
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
## 2024-10-12 NOTE — Anesthesia Preprocedure Evaluation (Signed)
"                                    Anesthesia Evaluation  Patient identified by MRN, date of birth, ID band Patient awake    Reviewed: Allergy & Precautions, NPO status , Patient's Chart, lab work & pertinent test results  History of Anesthesia Complications Negative for: history of anesthetic complications  Airway Mallampati: II  TM Distance: >3 FB Neck ROM: Full    Dental  (+) Dental Advisory Given, Teeth Intact   Pulmonary former smoker, PE (2019, 2020)   breath sounds clear to auscultation       Cardiovascular hypertension, pulmonary hypertension+ Peripheral Vascular Disease and + DVT   Rhythm:Regular  07/2023 ECHO: EF 50 to 55%.  1. The LV has low normal function, no regional wall motion abnormalities. Grade I diastolic dysfunction (impaired relaxation). There is the interventricular septum is  flattened in diastole ('D' shaped left ventricle), consistent with right ventricular volume overload.   2. RVF is mildly reduced. The right ventricular size is normal. There is severely elevated pulmonary artery systolic pressure.   3. The mitral valve is normal in structure. No evidence of mitral valve regurgitation. No evidence of mitral stenosis.   4. The aortic valve is tricuspid. Aortic valve regurgitation is trivial. No aortic stenosis is present.     Neuro/Psych negative neurological ROS     GI/Hepatic Neg liver ROS,GERD  Medicated and Controlled,,Colostomy s/p colonic perf 2024   Endo/Other  negative endocrine ROS    Renal/GU Renal InsufficiencyRenal disease     Musculoskeletal  (+) Arthritis ,    Abdominal   Peds  Hematology  (+) Blood dyscrasia (Hb 9.4, plt 235k), anemia   Anesthesia Other Findings   Reproductive/Obstetrics                              Anesthesia Physical Anesthesia Plan  ASA: 3  Anesthesia Plan: General   Post-op Pain Management: Minimal or no pain anticipated   Induction:   PONV Risk Score  and Plan: 2 and Ondansetron  and Dexamethasone   Airway Management Planned: Oral ETT and LMA  Additional Equipment: None  Intra-op Plan:   Post-operative Plan: Extubation in OR  Informed Consent: I have reviewed the patients History and Physical, chart, labs and discussed the procedure including the risks, benefits and alternatives for the proposed anesthesia with the patient or authorized representative who has indicated his/her understanding and acceptance.     Dental advisory given  Plan Discussed with: CRNA and Surgeon  Anesthesia Plan Comments: (PAT note written 10/12/2024 by Marinna Blane, PA-C.  )         Anesthesia Quick Evaluation  "

## 2024-10-13 ENCOUNTER — Encounter (HOSPITAL_COMMUNITY): Payer: Self-pay | Admitting: Vascular Surgery

## 2024-10-13 ENCOUNTER — Ambulatory Visit (HOSPITAL_BASED_OUTPATIENT_CLINIC_OR_DEPARTMENT_OTHER): Payer: Self-pay | Admitting: Vascular Surgery

## 2024-10-13 ENCOUNTER — Encounter (HOSPITAL_COMMUNITY): Admission: RE | Disposition: A | Payer: Self-pay | Source: Home / Self Care

## 2024-10-13 ENCOUNTER — Ambulatory Visit (HOSPITAL_BASED_OUTPATIENT_CLINIC_OR_DEPARTMENT_OTHER)
Admission: RE | Admit: 2024-10-13 | Discharge: 2024-10-13 | Disposition: A | Discharge status: Home / Self Care | Source: Home / Self Care

## 2024-10-13 ENCOUNTER — Encounter (HOSPITAL_COMMUNITY): Payer: Self-pay

## 2024-10-13 ENCOUNTER — Other Ambulatory Visit: Payer: Self-pay

## 2024-10-13 ENCOUNTER — Inpatient Hospital Stay (HOSPITAL_COMMUNITY)
Admission: EM | Admit: 2024-10-13 | Discharge: 2024-10-23 | DRG: 904 | Disposition: A | Discharge status: Home / Self Care

## 2024-10-13 ENCOUNTER — Encounter: Payer: Self-pay | Admitting: Student

## 2024-10-13 DIAGNOSIS — Z9049 Acquired absence of other specified parts of digestive tract: Secondary | ICD-10-CM

## 2024-10-13 DIAGNOSIS — Z7901 Long term (current) use of anticoagulants: Secondary | ICD-10-CM | POA: Diagnosis not present

## 2024-10-13 DIAGNOSIS — Z825 Family history of asthma and other chronic lower respiratory diseases: Secondary | ICD-10-CM

## 2024-10-13 DIAGNOSIS — Z87891 Personal history of nicotine dependence: Secondary | ICD-10-CM

## 2024-10-13 DIAGNOSIS — L24B3 Irritant contact dermatitis related to fecal or urinary stoma or fistula: Secondary | ICD-10-CM | POA: Diagnosis present

## 2024-10-13 DIAGNOSIS — T8189XD Other complications of procedures, not elsewhere classified, subsequent encounter: Secondary | ICD-10-CM

## 2024-10-13 DIAGNOSIS — Z8674 Personal history of sudden cardiac arrest: Secondary | ICD-10-CM | POA: Diagnosis not present

## 2024-10-13 DIAGNOSIS — K5790 Diverticulosis of intestine, part unspecified, without perforation or abscess without bleeding: Secondary | ICD-10-CM | POA: Diagnosis present

## 2024-10-13 DIAGNOSIS — Z8249 Family history of ischemic heart disease and other diseases of the circulatory system: Secondary | ICD-10-CM | POA: Diagnosis not present

## 2024-10-13 DIAGNOSIS — X58XXXA Exposure to other specified factors, initial encounter: Secondary | ICD-10-CM | POA: Insufficient documentation

## 2024-10-13 DIAGNOSIS — T8131XA Disruption of external operation (surgical) wound, not elsewhere classified, initial encounter: Principal | ICD-10-CM | POA: Diagnosis present

## 2024-10-13 DIAGNOSIS — K219 Gastro-esophageal reflux disease without esophagitis: Secondary | ICD-10-CM | POA: Insufficient documentation

## 2024-10-13 DIAGNOSIS — I10 Essential (primary) hypertension: Secondary | ICD-10-CM | POA: Diagnosis present

## 2024-10-13 DIAGNOSIS — K9413 Enterostomy malfunction: Secondary | ICD-10-CM | POA: Diagnosis present

## 2024-10-13 DIAGNOSIS — Z86718 Personal history of other venous thrombosis and embolism: Secondary | ICD-10-CM | POA: Diagnosis not present

## 2024-10-13 DIAGNOSIS — M1990 Unspecified osteoarthritis, unspecified site: Secondary | ICD-10-CM | POA: Insufficient documentation

## 2024-10-13 DIAGNOSIS — S31109D Unspecified open wound of abdominal wall, unspecified quadrant without penetration into peritoneal cavity, subsequent encounter: Secondary | ICD-10-CM

## 2024-10-13 DIAGNOSIS — Z4689 Encounter for fitting and adjustment of other specified devices: Principal | ICD-10-CM

## 2024-10-13 DIAGNOSIS — Z833 Family history of diabetes mellitus: Secondary | ICD-10-CM

## 2024-10-13 DIAGNOSIS — Z823 Family history of stroke: Secondary | ICD-10-CM | POA: Diagnosis not present

## 2024-10-13 DIAGNOSIS — I2729 Other secondary pulmonary hypertension: Secondary | ICD-10-CM | POA: Diagnosis present

## 2024-10-13 DIAGNOSIS — Z8719 Personal history of other diseases of the digestive system: Secondary | ICD-10-CM | POA: Diagnosis not present

## 2024-10-13 DIAGNOSIS — I272 Pulmonary hypertension, unspecified: Secondary | ICD-10-CM | POA: Insufficient documentation

## 2024-10-13 DIAGNOSIS — S31109A Unspecified open wound of abdominal wall, unspecified quadrant without penetration into peritoneal cavity, initial encounter: Secondary | ICD-10-CM | POA: Insufficient documentation

## 2024-10-13 DIAGNOSIS — Z79899 Other long term (current) drug therapy: Secondary | ICD-10-CM | POA: Diagnosis not present

## 2024-10-13 DIAGNOSIS — I739 Peripheral vascular disease, unspecified: Secondary | ICD-10-CM | POA: Diagnosis present

## 2024-10-13 DIAGNOSIS — Z86711 Personal history of pulmonary embolism: Secondary | ICD-10-CM

## 2024-10-13 DIAGNOSIS — Y832 Surgical operation with anastomosis, bypass or graft as the cause of abnormal reaction of the patient, or of later complication, without mention of misadventure at the time of the procedure: Secondary | ICD-10-CM | POA: Diagnosis present

## 2024-10-13 HISTORY — PX: APPLICATION OF WOUND VAC: SHX5189

## 2024-10-13 HISTORY — PX: SKIN SPLIT GRAFT: SHX444

## 2024-10-13 HISTORY — PX: APPLICATION, SKIN SUBSTITUTE: SHX7530

## 2024-10-13 LAB — BASIC METABOLIC PANEL WITH GFR
Anion gap: 11 (ref 5–15)
BUN: 11 mg/dL (ref 8–23)
CO2: 18 mmol/L — ABNORMAL LOW (ref 22–32)
Calcium: 9.2 mg/dL (ref 8.9–10.3)
Chloride: 109 mmol/L (ref 98–111)
Creatinine, Ser: 1.13 mg/dL (ref 0.61–1.24)
GFR, Estimated: >60 mL/min
Glucose, Bld: 77 mg/dL (ref 70–99)
Potassium: 4.2 mmol/L (ref 3.5–5.1)
Sodium: 138 mmol/L (ref 135–145)

## 2024-10-13 LAB — CBC
HCT: 38.2 % — ABNORMAL LOW (ref 39.0–52.0)
Hemoglobin: 12.2 g/dL — ABNORMAL LOW (ref 13.0–17.0)
MCH: 32.9 pg (ref 26.0–34.0)
MCHC: 31.9 g/dL (ref 30.0–36.0)
MCV: 103 fL — ABNORMAL HIGH (ref 80.0–100.0)
Platelets: 272 K/uL (ref 150–400)
RBC: 3.71 MIL/uL — ABNORMAL LOW (ref 4.22–5.81)
RDW: 16.8 % — ABNORMAL HIGH (ref 11.5–15.5)
WBC: 5.3 K/uL (ref 4.0–10.5)
nRBC: 0 % (ref 0.0–0.2)

## 2024-10-13 LAB — SURGICAL PCR SCREEN
MRSA, PCR: POSITIVE — AB
Staphylococcus aureus: POSITIVE — AB

## 2024-10-13 MED ORDER — IBUPROFEN 600 MG PO TABS: 600.0000 mg | ORAL_TABLET | Freq: Four times a day (QID) | ORAL | Status: DC | PRN

## 2024-10-13 MED ORDER — TRAZODONE HCL 100 MG PO TABS
100.0000 mg | ORAL_TABLET | Freq: Every day | ORAL | Status: DC
  Administered 2024-10-13 – 2024-10-22 (×10): 100 mg via ORAL
  Filled 2024-10-13: qty 2
  Filled 2024-10-13 (×9): qty 1

## 2024-10-13 MED ORDER — OXYCODONE HCL 5 MG PO TABS
5.0000 mg | ORAL_TABLET | ORAL | Status: DC | PRN
  Administered 2024-10-14: 10 mg via ORAL
  Administered 2024-10-14: 5 mg via ORAL
  Administered 2024-10-15 – 2024-10-17 (×8): 10 mg via ORAL
  Administered 2024-10-17: 5 mg via ORAL
  Administered 2024-10-17 – 2024-10-22 (×27): 10 mg via ORAL
  Administered 2024-10-22 – 2024-10-23 (×2): 5 mg via ORAL
  Administered 2024-10-23: 10 mg via ORAL
  Administered 2024-10-23: 5 mg via ORAL
  Filled 2024-10-13 (×35): qty 2
  Filled 2024-10-13: qty 1
  Filled 2024-10-13: qty 2
  Filled 2024-10-13: qty 1
  Filled 2024-10-13 (×4): qty 2

## 2024-10-13 MED ORDER — CHLORHEXIDINE GLUCONATE CLOTH 2 % EX PADS: 6.0000 | MEDICATED_PAD | Freq: Once | CUTANEOUS | Status: DC

## 2024-10-13 MED ORDER — SODIUM CHLORIDE 0.9 % IV SOLN
INTRAVENOUS | Status: DC | PRN
  Administered 2024-10-13: 1000 mL

## 2024-10-13 MED ORDER — ZOLPIDEM TARTRATE 5 MG PO TABS: 5.0000 mg | ORAL_TABLET | Freq: Every evening | ORAL | Status: DC | PRN

## 2024-10-13 MED ORDER — CEFAZOLIN SODIUM-DEXTROSE 2-4 GM/100ML-% IV SOLN
2.0000 g | INTRAVENOUS | Status: AC
  Administered 2024-10-13: 2 g via INTRAVENOUS
  Filled 2024-10-13: qty 100

## 2024-10-13 MED ORDER — HEPARIN SODIUM (PORCINE) 5000 UNIT/ML IJ SOLN
5000.0000 [IU] | Freq: Three times a day (TID) | INTRAMUSCULAR | Status: DC
  Administered 2024-10-13 – 2024-10-15 (×5): 5000 [IU] via SUBCUTANEOUS
  Filled 2024-10-13 (×5): qty 1

## 2024-10-13 MED ORDER — PROPOFOL 10 MG/ML IV BOLUS
INTRAVENOUS | Status: AC
  Filled 2024-10-13: qty 20

## 2024-10-13 MED ORDER — GABAPENTIN 300 MG PO CAPS
300.0000 mg | ORAL_CAPSULE | ORAL | Status: AC
  Administered 2024-10-13: 300 mg via ORAL
  Filled 2024-10-13: qty 1

## 2024-10-13 MED ORDER — ACETAMINOPHEN 500 MG PO TABS
1000.0000 mg | ORAL_TABLET | Freq: Four times a day (QID) | ORAL | Status: DC
  Administered 2024-10-13 – 2024-10-23 (×33): 1000 mg via ORAL
  Filled 2024-10-13 (×37): qty 2

## 2024-10-13 MED ORDER — MIDAZOLAM HCL (PF) 2 MG/2ML IJ SOLN
INTRAMUSCULAR | Status: DC | PRN
  Administered 2024-10-13: 2 mg via INTRAVENOUS

## 2024-10-13 MED ORDER — PROCHLORPERAZINE MALEATE 10 MG PO TABS: 10.0000 mg | ORAL_TABLET | Freq: Four times a day (QID) | ORAL | Status: DC | PRN

## 2024-10-13 MED ORDER — MIDAZOLAM HCL 2 MG/2ML IJ SOLN
INTRAMUSCULAR | Status: AC
  Filled 2024-10-13: qty 2

## 2024-10-13 MED ORDER — FENTANYL CITRATE (PF) 250 MCG/5ML IJ SOLN
INTRAMUSCULAR | Status: DC | PRN
  Administered 2024-10-13: 50 ug via INTRAVENOUS
  Administered 2024-10-13 (×2): 25 ug via INTRAVENOUS

## 2024-10-13 MED ORDER — LACTATED RINGERS IV SOLN: INTRAVENOUS | Status: DC

## 2024-10-13 MED ORDER — VASHE WOUND IRRIGATION OPTIME
TOPICAL | Status: DC | PRN
  Administered 2024-10-13: 34 [oz_av]

## 2024-10-13 MED ORDER — DEXAMETHASONE SOD PHOSPHATE PF 10 MG/ML IJ SOLN
INTRAMUSCULAR | Status: DC | PRN
  Administered 2024-10-13: 10 mg via INTRAVENOUS

## 2024-10-13 MED ORDER — CHLORHEXIDINE GLUCONATE CLOTH 2 % EX PADS: 6.0000 | MEDICATED_PAD | Freq: Every day | CUTANEOUS | Status: DC

## 2024-10-13 MED ORDER — GABAPENTIN 300 MG PO CAPS
300.0000 mg | ORAL_CAPSULE | Freq: Three times a day (TID) | ORAL | Status: DC
  Administered 2024-10-13 – 2024-10-23 (×29): 300 mg via ORAL
  Filled 2024-10-13 (×25): qty 1
  Filled 2024-10-13: qty 3
  Filled 2024-10-13 (×3): qty 1

## 2024-10-13 MED ORDER — LIDOCAINE 2% (20 MG/ML) 5 ML SYRINGE
INTRAMUSCULAR | Status: DC | PRN
  Administered 2024-10-13: 60 mg via INTRAVENOUS

## 2024-10-13 MED ORDER — 0.9 % SODIUM CHLORIDE (POUR BTL) OPTIME
TOPICAL | Status: DC | PRN
  Administered 2024-10-13: 1000 mL

## 2024-10-13 MED ORDER — ACETAMINOPHEN 500 MG PO TABS
1000.0000 mg | ORAL_TABLET | ORAL | Status: AC
  Administered 2024-10-13: 1000 mg via ORAL
  Filled 2024-10-13: qty 2

## 2024-10-13 MED ORDER — PANTOPRAZOLE SODIUM 40 MG IV SOLR
40.0000 mg | Freq: Every day | INTRAVENOUS | Status: DC
  Administered 2024-10-13 – 2024-10-15 (×3): 40 mg via INTRAVENOUS
  Filled 2024-10-13 (×3): qty 10

## 2024-10-13 MED ORDER — ORAL CARE MOUTH RINSE: 15.0000 mL | Freq: Once | OROMUCOSAL | Status: AC

## 2024-10-13 MED ORDER — PROPOFOL 10 MG/ML IV BOLUS
INTRAVENOUS | Status: DC | PRN
  Administered 2024-10-13: 130 mg via INTRAVENOUS

## 2024-10-13 MED ORDER — CEFAZOLIN SODIUM-DEXTROSE 2-4 GM/100ML-% IV SOLN
2.0000 g | Freq: Three times a day (TID) | INTRAVENOUS | Status: AC
  Administered 2024-10-13 – 2024-10-20 (×21): 2 g via INTRAVENOUS
  Filled 2024-10-13 (×21): qty 100

## 2024-10-13 MED ORDER — METHOCARBAMOL 500 MG PO TABS
500.0000 mg | ORAL_TABLET | Freq: Three times a day (TID) | ORAL | Status: DC | PRN
  Administered 2024-10-16 – 2024-10-23 (×13): 500 mg via ORAL
  Filled 2024-10-13 (×13): qty 1

## 2024-10-13 MED ORDER — MUPIROCIN 2 % EX OINT: 1.0000 | TOPICAL_OINTMENT | Freq: Two times a day (BID) | CUTANEOUS | Status: DC

## 2024-10-13 MED ORDER — ONDANSETRON HCL 4 MG/2ML IJ SOLN
INTRAMUSCULAR | Status: DC | PRN
  Administered 2024-10-13: 4 mg via INTRAVENOUS

## 2024-10-13 MED ORDER — METHOCARBAMOL 1000 MG/10ML IJ SOLN: 500.0000 mg | Freq: Three times a day (TID) | INTRAMUSCULAR | Status: DC | PRN

## 2024-10-13 MED ORDER — LIDOCAINE-EPINEPHRINE 1 %-1:100000 IJ SOLN
INTRAMUSCULAR | Status: DC | PRN
  Administered 2024-10-13: 40 mL

## 2024-10-13 MED ORDER — MINERAL OIL LIGHT OIL
TOPICAL_OIL | Status: DC | PRN
  Administered 2024-10-13: 1 via TOPICAL

## 2024-10-13 MED ORDER — CHLORHEXIDINE GLUCONATE 0.12 % MT SOLN
15.0000 mL | Freq: Once | OROMUCOSAL | Status: AC
  Administered 2024-10-13: 15 mL via OROMUCOSAL
  Filled 2024-10-13: qty 15

## 2024-10-13 MED ORDER — FENTANYL CITRATE (PF) 250 MCG/5ML IJ SOLN
INTRAMUSCULAR | Status: AC
  Filled 2024-10-13: qty 5

## 2024-10-13 MED ORDER — PROCHLORPERAZINE EDISYLATE 10 MG/2ML IJ SOLN: 5.0000 mg | Freq: Four times a day (QID) | INTRAMUSCULAR | Status: DC | PRN

## 2024-10-13 MED ORDER — LIDOCAINE-EPINEPHRINE 1 %-1:100000 IJ SOLN
INTRAMUSCULAR | Status: AC
  Filled 2024-10-13: qty 2

## 2024-10-13 MED ORDER — MUPIROCIN 2 % EX OINT
1.0000 | TOPICAL_OINTMENT | Freq: Once | CUTANEOUS | Status: AC
  Administered 2024-10-13: 1 via TOPICAL
  Filled 2024-10-13: qty 22

## 2024-10-13 MED ORDER — ENSURE PLUS HIGH PROTEIN PO LIQD
237.0000 mL | Freq: Two times a day (BID) | ORAL | Status: DC
  Administered 2024-10-14 – 2024-10-23 (×17): 237 mL via ORAL
  Filled 2024-10-13 (×3): qty 237

## 2024-10-13 NOTE — Discharge Instructions (Signed)
 Activity (include date of return to work if known) As tolerated: NO showers. Ok to sponge bath NO driving No heavy activities  Diet:regular No restrictions  Wound Care: Keep dressing clean & dry  Do not change dressings Special Instructions: Do not raise arms up Continue to empty, recharge, & record drainage from J-P drains &/or Hemovacs 2-3 times a day, as needed. Call Doctor if any unusual problems occur such as pain, excessive Bleeding, unrelieved Nausea/vomiting, Fever &/or chills, or problems with your wound vac When lying down, keep head elevated on 2-3 pillows or back-rest Follow-up appointment: Please call the office.  The patient received discharge instruction from:___________________________________________   Patient signature ________________________________________ / Date___________    Signature of individual providing instructions/ Date________________

## 2024-10-13 NOTE — Consult Note (Deleted)
"   °  Subjective: Patient is a 64 year old male with history of an abdominal wound.  He underwent split thickness graft to the abdominal wound, application of skin substitute to the donor site and application of wound VAC earlier today with Dr. Montorfano.  Patient had called the on-call service with concerns about his Prevena wound VAC.  He reported that the wound VAC was beeping and that it would not stop.  Given the wound location and proximity to the stoma, graft is at high risk of failing.  Given this, it was recommended that patient go back to the hospital to have KCI 43M wound VAC applied and patient to be admitted.  Upon entering the room in the emergency department, patient is sitting upright in no acute distress.  He reports that when he got home, he was mostly sitting, but the wound VAC would not stop beeping.  Does not report any other issues or concerns at this time.  Objective: Vital signs in last 24 hours: Temp:  [97.5 F (36.4 C)-98 F (36.7 C)] 97.9 F (36.6 C) (01/13 2112) Pulse Rate:  [77-99] 99 (01/13 2112) Resp:  [17-20] 18 (01/13 2112) BP: (114-126)/(75-79) 126/75 (01/13 2112) SpO2:  [93 %-100 %] 97 % (01/13 2112) Weight:  [86.2 kg] 86.2 kg (01/13 2124)    Intake/Output from previous day: No intake/output data recorded. Intake/Output this shift: No intake/output data recorded.  General appearance: alert, cooperative, and no distress Resp: Unlabored breathing, no respiratory distress GI: Wound VAC to the abdomen does appear to be peeling off.  This was completely removed.  Skin graft appears to be in place with staples.  There is no surrounding erythema or drainage.  No concerning signs on exam.  Dressings and colostomy bag over the stoma are clean dry and intact.  New wound vac drapes were applied. New wound machine was attached to tubing and adequate suction was achieved at 100 mmHg.   Lab Results:     Latest Ref Rng & Units 10/13/2024   11:55 AM 06/24/2024    12:11 PM 06/11/2024    4:15 AM  CBC  WBC 4.0 - 10.5 K/uL 5.3  6.0  5.9   Hemoglobin 13.0 - 17.0 g/dL 87.7  88.8  9.0   Hematocrit 39.0 - 52.0 % 38.2  33.8  28.6   Platelets 150 - 400 K/uL 272  404.0  306     BMET Recent Labs    10/13/24 1155  NA 138  K 4.2  CL 109  CO2 18*  GLUCOSE 77  BUN 11  CREATININE 1.13  CALCIUM  9.2   PT/INR No results for input(s): LABPROT, INR in the last 72 hours. ABG No results for input(s): PHART, HCO3 in the last 72 hours.  Invalid input(s): PCO2, PO2  Studies/Results: No results found.  Anti-infectives: Anti-infectives (From admission, onward)    None       Assessment/Plan: s/p split thickness skin graft to abdominal wound Plan: -Discussed with the patient that we will plan to admit him while wound VAC is in place to optimize graft take/success.  Patient expressed understanding was in agreement with this. -Dr. Montorfano was present during today's encounter and is in agreement with the plan  LOS: 0 days    Estefana FORBES Peck, PA-C 10/13/2024  "

## 2024-10-13 NOTE — H&P (Signed)
 Subjective: Patient is a 64 year old male with history of an abdominal wound.  He underwent split thickness graft to the abdominal wound, application of skin substitute to the donor site and application of wound VAC earlier today with Dr. Montorfano.   Patient had called the on-call service with concerns about his Prevena wound VAC.  He reported that the wound VAC was beeping and that it would not stop.  Given the wound location and proximity to the stoma, graft is at high risk of failing.  Given this, it was recommended that patient go back to the hospital to have KCI 53M wound VAC applied and patient to be admitted.   Upon entering the room in the emergency department, patient is sitting upright in no acute distress.  He reports that when he got home, he was mostly sitting, but the wound VAC would not stop beeping.  Does not report any other issues or concerns at this time.   Objective: Vital signs in last 24 hours: Temp:  [97.5 F (36.4 C)-98 F (36.7 C)] 97.9 F (36.6 C) (01/13 2112) Pulse Rate:  [77-99] 99 (01/13 2112) Resp:  [17-20] 18 (01/13 2112) BP: (114-126)/(75-79) 126/75 (01/13 2112) SpO2:  [93 %-100 %] 97 % (01/13 2112) Weight:  [86.2 kg] 86.2 kg (01/13 2124)   Intake/Output from previous day: No intake/output data recorded. Intake/Output this shift: No intake/output data recorded.   General appearance: alert, cooperative, and no distress Resp: Unlabored breathing, no respiratory distress GI: Wound VAC to the abdomen does appear to be peeling off.  This was completely removed.  Skin graft appears to be in place with staples.  There is no surrounding erythema or drainage.  No concerning signs on exam.  Dressings and colostomy bag over the stoma are clean dry and intact.   New wound vac drapes were applied. New wound machine was attached to tubing and adequate suction was achieved at 100 mmHg.    Lab Results:      Latest Ref Rng & Units 10/13/2024   11:55 AM 06/24/2024    12:11 PM 06/11/2024    4:15 AM  CBC  WBC 4.0 - 10.5 K/uL 5.3  6.0  5.9   Hemoglobin 13.0 - 17.0 g/dL 87.7  88.8  9.0   Hematocrit 39.0 - 52.0 % 38.2  33.8  28.6   Platelets 150 - 400 K/uL 272  404.0  306     BMET Recent Labs (last 2 labs)     Recent Labs    10/13/24 1155  NA 138  K 4.2  CL 109  CO2 18*  GLUCOSE 77  BUN 11  CREATININE 1.13  CALCIUM  9.2      PT/INR Recent Labs (last 2 labs)  No results for input(s): LABPROT, INR in the last 72 hours.   ABG Recent Labs (last 2 labs)  No results for input(s): PHART, HCO3 in the last 72 hours.   Invalid input(s): PCO2, PO2      Studies/Results: Imaging Results (Last 48 hours)  No results found.     Anti-infectives: Anti-infectives (From admission, onward)        None             Assessment/Plan: s/p split thickness skin graft to abdominal wound Plan: -Discussed with the patient that we will plan to admit him while wound VAC is in place to optimize graft take/success.  Patient expressed understanding was in agreement with this. -Dr. Montorfano was present during today's encounter and is in agreement  with the plan  LOS: 0 days

## 2024-10-13 NOTE — Interval H&P Note (Signed)
 History and Physical Interval Note:  10/13/2024 11:34 AM  Alexander [name]  has presented today for surgery, with the diagnosis of abdominal wound.  The various methods of treatment have been discussed with the patient and family. After consideration of risks, benefits and other options for treatment, the patient has consented to  Procedures with comments: APPLICATION, GRAFT, SKIN, SPLIT-THICKNESS (N/A) - Skin graft to abdominal wall (11 cm x 9 cm), application of skin substitute to donor site, application of wound vac to abdomen APPLICATION, SKIN SUBSTITUTE (N/A) APPLICATION, WOUND VAC (N/A) as a surgical intervention.  The patient's history has been reviewed, patient examined, no change in status, stable for surgery.  I have reviewed the patient's chart and labs.  Questions were answered to the patient's satisfaction.     Jadalynn Burr M Trenese Haft

## 2024-10-13 NOTE — ED Triage Notes (Signed)
 Pt arrive POV states he just go dc from the hospital with a wound vac and the wound vac stopped working while at home.

## 2024-10-13 NOTE — Anesthesia Procedure Notes (Signed)
 Procedure Name: LMA Insertion Date/Time: 10/13/2024 1:33 PM  Performed by: Cena Epps, CRNAPre-anesthesia Checklist: Patient identified, Emergency Drugs available, Suction available and Patient being monitored Patient Re-evaluated:Patient Re-evaluated prior to induction Oxygen Delivery Method: Circle System Utilized Preoxygenation: Pre-oxygenation with 100% oxygen Induction Type: IV induction Ventilation: Mask ventilation without difficulty LMA: LMA inserted LMA Size: 4.0 Number of attempts: 1 Airway Equipment and Method: Bite block Placement Confirmation: positive ETCO2 Tube secured with: Tape Dental Injury: Teeth and Oropharynx as per pre-operative assessment

## 2024-10-13 NOTE — Op Note (Signed)
 Operative Note   DATE OF OPERATION: 10/13/2024  LOCATION: [facility]  SURGICAL DEPARTMENT: Plastic Surgery  PREOPERATIVE DIAGNOSES:  Chronic abdominal wound  POSTOPERATIVE DIAGNOSES:  same  PROCEDURE:   Split thickness skin graft to abdominal wound (10 cm x 7 cm) 2.   Application of skin substitute to donor site (Myriad Matrix 7 cm x 10 cm) 3.   Application of wound vac (Prevena)  SURGEON: Nieves CHRISTELLA Client MD  ASSISTANT: N/A  ANESTHESIA:  General.   COMPLICATIONS: None.   INDICATIONS FOR PROCEDURE:  The patient, Alexander [name] is a 64 y.o. male born on Nov 22, 1960, is here for treatment of chronic abdominal wound MRN: [id number]  CONSENT:  We discussed the risks benefits and alternatives to wound reconstruction by skin grafting. We discussed the alternatives including wound healing by secondary intention; however, this can be a lengthy, painful process that requires intensive daily wound management and may result in an unacceptable scar. We discussed the benefits of closure by skin graft including an expedited healing time and a more acceptable reconstructive result. We discussed the risks of surgery, including but not limited to infection, bleeding, seroma, damage to surrounding healthy tissues including bones, nerves and blood vessels, need for further surgery, graft failure, wound dehiscence and poor scarring. We dicussed the anticipated donor site wound and the postoperative care that will be necessary. We discussed the importance of a wound vac and dressings for the graft site which will have to remain in place for at least 7 days. We also discussed the risks of anesthesia, that will be further elaborated by our anesthesia colleagues. The patient again voiced their understanding of the above and once again confirmed informed consent.   DESCRIPTION OF PROCEDURE:  The patient was taken to the operating room. SCDs were placed and IV antibiotics were given. The patient's  operative site was prepped and draped in a sterile fashion. A time out was performed and all information was confirmed to be correct.  General anesthesia was administered.    Attention was then turned towards the right lower extremity. The area was infiltrated with lidocaine  with epinephrine  1% for hemostasis, followed by glicerine for lubrication. A 9 cm x 5 cm harvest site was marked. The dermatome was fitted and set to a depth of 12 millimeters. A scalpel was passed between the blade and the Dermatome to ensure that the harvest site was patent. The area was tensioned and the skin graft was harvested and passed through a 2:1 mesher and placed in saline.Then the attention was placed on the abdomen. Pulse lavage was used to clean the wound with antibiotic irrigation, hemostasis was achieved with bovie elctrocautery, the graft was then inset into the abdomen using staples. Myriad skin substitute was applied to donor site. This was secured with 4-0 Vycrils.. The donor site was covered with a Tegasorb dressing and the skin grafts recipient site was dressed with a Prevena wound vac. No leaks at the end of the case.   The patient tolerated the procedure well.  There were no complications. The patient was allowed to wake from anesthesia, extubated and taken to the recovery room in satisfactory condition.   Baby Gieger M. Danaja Lasota, MD [facility]

## 2024-10-13 NOTE — ED Triage Notes (Signed)
 Pt to ED, reports just d/c from hospital today with skin graft to abdominal wound and wound vac placement, reports wound vac loose aprox 3 hours.

## 2024-10-13 NOTE — Transfer of Care (Signed)
 Immediate Anesthesia Transfer of Care Note  Patient: Alexander [name]  Procedure(s) Performed: APPLICATION, GRAFT, SKIN, SPLIT-THICKNESS (Abdomen) APPLICATION, SKIN SUBSTITUTE (Abdomen) APPLICATION, WOUND VAC (Abdomen)  Patient Location: PACU  Anesthesia Type:General  Level of Consciousness: awake, alert , and oriented  Airway & Oxygen Therapy: Patient Spontanous Breathing  Post-op Assessment: Report given to RN, Post -op Vital signs reviewed and stable, and Patient moving all extremities X 4  Post vital signs: Reviewed and stable  Last Vitals:  Vitals Value Taken Time  BP 114/75 10/13/24 15:17  Temp    Pulse 95 10/13/24 15:19  Resp 17 10/13/24 15:19  SpO2 97 % 10/13/24 15:19  Vitals shown include unfiled device data.  Last Pain:  Vitals:   10/13/24 1143  TempSrc:   PainSc: 0-No pain         Complications: No notable events documented.

## 2024-10-13 NOTE — ED Provider Triage Note (Signed)
 Emergency Medicine Provider Triage Evaluation Note  Alexander [name] , a 64 y.o. male  was evaluated in triage.  Pt complains of complications with the wound VAC.  Notes had a procedure earlier today for a skin graft in the back came loose and is leaking.  Sent to ED by plastic surgery for wound VAC change and potential admission.  I did message Estefana Peck, PA-C with plastic surgery who advised that her and her attending are coming down to evaluate patient now for wound VAC change and admission.  Review of Systems  Positive: Wound Negative: Fevers, nausea/vomiting  Physical Exam  BP 126/75 (BP Location: Right Arm)   Pulse 99   Temp 97.9 F (36.6 C)   Resp 18   Ht 5' 9 (1.753 m)   Wt 86.2 kg   SpO2 97%   BMI 28.06 kg/m  Gen:   Awake, no distress   Resp:  Normal effort  MSK:   Moves extremities without difficulty  Other:  Large wound VAC present with loose seal on central abdomen  Medical Decision Making  Medically screening exam initiated at 9:38 PM.  Appropriate orders placed.  Alexander [name] was informed that the remainder of the evaluation will be completed by another provider, this initial triage assessment does not replace that evaluation, and the importance of remaining in the ED until their evaluation is complete.     Neysa Thersia [name], [place] 10/13/24 2140

## 2024-10-13 NOTE — Progress Notes (Signed)
"  Page was received from on-call service. I attempted to call the patient several times, but he did not answer. "

## 2024-10-13 NOTE — ED Notes (Signed)
Plastics to bedside.

## 2024-10-14 ENCOUNTER — Encounter (HOSPITAL_COMMUNITY): Payer: Self-pay

## 2024-10-14 LAB — CBC
HCT: 34.2 % — ABNORMAL LOW (ref 39.0–52.0)
Hemoglobin: 11.2 g/dL — ABNORMAL LOW (ref 13.0–17.0)
MCH: 33.1 pg (ref 26.0–34.0)
MCHC: 32.7 g/dL (ref 30.0–36.0)
MCV: 101.2 fL — ABNORMAL HIGH (ref 80.0–100.0)
Platelets: 283 K/uL (ref 150–400)
RBC: 3.38 MIL/uL — ABNORMAL LOW (ref 4.22–5.81)
RDW: 16.5 % — ABNORMAL HIGH (ref 11.5–15.5)
WBC: 5.8 K/uL (ref 4.0–10.5)
nRBC: 0 % (ref 0.0–0.2)

## 2024-10-14 LAB — CBG MONITORING, ED: Glucose-Capillary: 161 mg/dL — ABNORMAL HIGH (ref 70–99)

## 2024-10-14 LAB — CREATININE, SERUM
Creatinine, Ser: 1.16 mg/dL (ref 0.61–1.24)
GFR, Estimated: >60 mL/min

## 2024-10-14 NOTE — Discharge Instructions (Signed)
 Good luck with surgery!  Take an extra pouch set with you to hospital.  Coloplast pouches not on formulary.

## 2024-10-14 NOTE — Consult Note (Signed)
 WOC Nurse ostomy consult note Was admitted for leaking incisional VAC. Applied traditional VAC dressing and hospital unit Stoma type/location: RLQ ileostomy  was changed in ostomy clinic 10/13/23 Stomal assessment/size:  1  Peristomal assessment: creasing at 3 o'clock Treatment options for stomal/peristomal skin:  stoma powder and skin prep  barrier ring in crease and around stoma.  1 piece convex coloplast pouch with barrier strips to perimeter.  PRefers additional pink tape around perimeter as well.  Output liquid brown stool Ostomy pouching: 1pc. Convex  left at bedside.  Education provided: Will change pouch Thursday or Friday.  Will coincide with VAC change if possible and needed.   I have reached out to Dr Montorfano for guidance on management of VAC dressing. Order placed to leave VAC in place.   Will follow.  Darice Cooley MSN, RN, FNP-BC CWON Wound, Ostomy, Continence Nurse Outpatient [facility] [phone] Work cell phone:  [phone]

## 2024-10-14 NOTE — Progress Notes (Signed)
"   °  Subjective: Doing well this morning.  No pain. Wound VAC with appropriate seal and no leaks.  Objective: Vital signs in last 24 hours: Temp:  [97.5 F (36.4 C)-98 F (36.7 C)] 98 F (36.7 C) (01/14 0610) Pulse Rate:  [77-101] 84 (01/14 0610) Resp:  [17-20] 18 (01/14 0610) BP: (106-126)/(69-81) 112/74 (01/14 0610) SpO2:  [93 %-100 %] 97 % (01/14 0610) Weight:  [86.2 kg] 86.2 kg (01/13 2124)    Intake/Output from previous day: 01/13 0701 - 01/14 0700 In: 646.9 [P.O.:450; IV Piggyback:196.9] Out: 310 [Urine:210; Stool:100] Intake/Output this shift: No intake/output data recorded.  Patient alert oriented x 3 Hemodynamically normal Abdomen soft nontender nondistended.  Stoma pink and viable.  Wound VAC on abdomen with no leaks holding suction.  Lab Results:  CBC    Component Value Date/Time   WBC 5.8 10/14/2024 0434   RBC 3.38 (L) 10/14/2024 0434   HGB 11.2 (L) 10/14/2024 0434   HGB 14.0 04/27/2019 0000   HCT 34.2 (L) 10/14/2024 0434   HCT 40.1 04/27/2019 0000   PLT 283 10/14/2024 0434   PLT 277 04/27/2019 0000   MCV 101.2 (H) 10/14/2024 0434   MCV 102 (H) 04/27/2019 0000   MCH 33.1 10/14/2024 0434   MCHC 32.7 10/14/2024 0434   RDW 16.5 (H) 10/14/2024 0434   RDW 13.1 04/27/2019 0000   LYMPHSABS 1.5 06/24/2024 1211   LYMPHSABS 1.4 04/27/2019 0000   MONOABS 0.5 06/24/2024 1211   EOSABS 0.0 06/24/2024 1211   EOSABS 0.1 04/27/2019 0000   BASOSABS 0.0 06/24/2024 1211   BASOSABS 0.0 04/27/2019 0000   Anti-infectives: Anti-infectives (From admission, onward)    Start     Dose/Rate Route Frequency Ordered Stop   10/13/24 2230  ceFAZolin  (ANCEF ) IVPB 2g/100 mL premix        2 g 200 mL/hr over 30 Minutes Intravenous Every 8 hours 10/13/24 2228 10/20/24 2159       Assessment/Plan: 64 year old male with history of an abdominal wound. He underwent split thickness graft to the abdominal wound, application of skin substitute to the donor site and application of wound  VAC earlier 10/13/2024.  Patient had issues with wound VAC at home, the wound VAC was peeling off and leaking.  Patient was instructed to come to the emergency department for further evaluation and possible admission for wound [facility] management as inpatient.  Patient will remain admitted until Monday for wound VAC takedown. Okay to have regular diet. Stoma nurse consulted for stoma management. Home medications resumed.  We will hold on anticoagulation until tomorrow. All questions answered to patient satisfaction.   LOS: 1 day    Alahna Dunne M Raevin Wierenga 10/14/2024  "

## 2024-10-14 NOTE — ED Notes (Signed)
 Pt was able to empty his own colostomy bag with assistance in handing pt wipes and other misc. items requested to clean the bag.

## 2024-10-14 NOTE — ED Notes (Signed)
 Floor notified patient coming up

## 2024-10-14 NOTE — Anesthesia Postprocedure Evaluation (Signed)
"   Anesthesia Post Note  Patient: Alexander [name]  Procedure(s) Performed: APPLICATION, GRAFT, SKIN, SPLIT-THICKNESS (Abdomen) APPLICATION, SKIN SUBSTITUTE (Abdomen) APPLICATION, WOUND VAC (Abdomen)     Patient location during evaluation: PACU Anesthesia Type: General Level of consciousness: awake and alert Pain management: pain level controlled Vital Signs Assessment: post-procedure vital signs reviewed and stable Respiratory status: spontaneous breathing, nonlabored ventilation and respiratory function stable Cardiovascular status: blood pressure returned to baseline and stable Postop Assessment: no apparent nausea or vomiting Anesthetic complications: no   No notable events documented.  Last Vitals:  Vitals:   10/13/24 1545 10/13/24 1600  BP: 126/78 121/79  Pulse: 88 87  Resp: 17 17  Temp:  (!) 36.4 C  SpO2: 98% 99%    Last Pain:  Vitals:   10/13/24 1545  TempSrc:   PainSc: 0-No pain                 Sage Kopera      "

## 2024-10-14 NOTE — Progress Notes (Signed)
" ° °  Brief Progress Note   _____________________________________________________________________________________________________________  Patient Name: Alexander [name] Patient DOB: 04/23/1961 Date: @TODAY @      Data: 64 yo male currently awaiting admission to a Med[facility]Surg bed at [facility].    Action: Reviewed the patient's vital signs, lab results, and clinical notes to assess if patient appropriate to level-load from [place] to [facility].    Response:  Plastic surgery consulted.   _____________________________________________________________________________________________________________  [facility] RN [facility] Kwabena Strutz S Queenie Aufiero Please contact us  directly via secure chat (search for [facility]) or by calling us  at [phone] [facility]).  "

## 2024-10-14 NOTE — ED Notes (Signed)
 Assisted pt with emptying his ostomy bag. Provided him with 2 cups of ice and set the breakfast tray close to him. Hooked him back up to the monitor. Call bell within reach

## 2024-10-15 MED ORDER — CHLORHEXIDINE GLUCONATE CLOTH 2 % EX PADS
6.0000 | MEDICATED_PAD | Freq: Every day | CUTANEOUS | Status: DC
  Administered 2024-10-16 – 2024-10-23 (×7): 6 via TOPICAL

## 2024-10-15 MED ORDER — LORATADINE 10 MG PO TABS
10.0000 mg | ORAL_TABLET | Freq: Every day | ORAL | Status: DC | PRN
  Administered 2024-10-15 – 2024-10-22 (×5): 10 mg via ORAL
  Filled 2024-10-15 (×6): qty 1

## 2024-10-15 MED ORDER — MUPIROCIN 2 % EX OINT
1.0000 | TOPICAL_OINTMENT | Freq: Two times a day (BID) | CUTANEOUS | Status: DC
  Administered 2024-10-16 – 2024-10-23 (×15): 1 via NASAL
  Filled 2024-10-15 (×2): qty 22

## 2024-10-15 MED ORDER — APIXABAN 5 MG PO TABS
5.0000 mg | ORAL_TABLET | Freq: Two times a day (BID) | ORAL | Status: DC
  Administered 2024-10-15 – 2024-10-23 (×16): 5 mg via ORAL
  Filled 2024-10-15 (×16): qty 1

## 2024-10-15 NOTE — Progress Notes (Signed)
"   °  Subjective: Patient is a 64 year old male with history of an abdominal wound. He underwent split thickness graft to the abdominal wound, application of skin substitute to the donor site and application of wound VAC with Dr. Montorfano on 10/13/2024.  Prevena wound VAC failed at home and patient was admitted for observation and wound VAC placement over the skin graft.  Upon entering the room today, patient is resting comfortably in bed.  He reports that he has a little bit of itching around the wound VAC drapes.  He otherwise denies any issues or concerns.    Objective: Vital signs in last 24 hours: Temp:  [97.7 F (36.5 C)-98.3 F (36.8 C)] 97.9 F (36.6 C) (01/15 0445) Pulse Rate:  [73-88] 81 (01/15 0445) Resp:  [12-18] 16 (01/15 0445) BP: (103-119)/(58-73) 112/73 (01/15 0445) SpO2:  [97 %-100 %] 98 % (01/15 0445) Last BM Date : 10/14/24  Intake/Output from previous day: 01/14 0701 - 01/15 0700 In: 840 [P.O.:840] Out: 1895 [Urine:975; Drains:20; Stool:900] Intake/Output this shift: Total I/O In: -  Out: 550 [Urine:250; Stool:300]  General appearance: alert, cooperative, and no distress Resp: Unlabored breathing, no respiratory distress GI: Wound VAC is in place with adequate seal at 125 mmHg.  No leaks are appreciated on exam.  Ostomy site appears to be clean.  Lab Results:     Latest Ref Rng & Units 10/14/2024    4:34 AM 10/13/2024   11:55 AM 06/24/2024   12:11 PM  CBC  WBC 4.0 - 10.5 K/uL 5.8  5.3  6.0   Hemoglobin 13.0 - 17.0 g/dL 88.7  87.7  88.8   Hematocrit 39.0 - 52.0 % 34.2  38.2  33.8   Platelets 150 - 400 K/uL 283  272  404.0     BMET Recent Labs    10/13/24 1155 10/14/24 0434  NA 138  --   K 4.2  --   CL 109  --   CO2 18*  --   GLUCOSE 77  --   BUN 11  --   CREATININE 1.13 1.16  CALCIUM  9.2  --    PT/INR No results for input(s): LABPROT, INR in the last 72 hours. ABG No results for input(s): PHART, HCO3 in the last 72 hours.  Invalid  input(s): PCO2, PO2  Studies/Results: No results found.  Anti-infectives: Anti-infectives (From admission, onward)    Start     Dose/Rate Route Frequency Ordered Stop   10/13/24 2230  ceFAZolin  (ANCEF ) IVPB 2g/100 mL premix        2 g 200 mL/hr over 30 Minutes Intravenous Every 8 hours 10/13/24 2228 10/20/24 2159       Assessment/Plan: s/p skin graft to abdominal wound Plan: - Continue with wound VAC at 125 mmHg.  Will plan to takedown the wound VAC on Monday. - Loratadine  prn for itching  - Heparin  D/Cd, patient to restart Eliquis   -Wound care nurse was consulted for ostomy care.  Appreciate their recommendations.  LOS: 2 days    Estefana FORBES Peck, PA-C 10/15/2024  "

## 2024-10-15 NOTE — Plan of Care (Signed)

## 2024-10-15 NOTE — TOC Initial Note (Signed)
 Transition of Care (TOC) - Initial/Assessment Note   Spoke to patient at bedside.   Confirmed face sheet information.   PCP Lucie Buttner   Patient has insurance.   Patient has cane no other DME.   Per chart patient will remain in hospital until Monday when [facility] will be removed.     Inpatient Care Management Team [place])  has reviewed patient and  will continue to monitor patient advancement through interdisciplinary progression rounds. If new patient transition needs arise, please place a ICMT consult.   Patient Details  Name: Alexander [name] MRN: [id number] Date of Birth: Feb 14, 1961  Transition of Care [facility]) CM/SW Contact:    Stephane Powell Jansky, RN Phone Number: 10/15/2024, 8:58 AM  Clinical Narrative:                   Expected Discharge Plan: Home/Self Care Barriers to Discharge: Continued Medical Work up   Patient Goals and CMS Choice Patient states their goals for this hospitalization and ongoing recovery are:: to return to home   Choice offered to / list presented to : NA      Expected Discharge Plan and Services   Discharge Planning Services: CM Consult Post Acute Care Choice: NA Living arrangements for the past 2 months: Apartment                 DME Arranged: N/A DME Agency: NA       HH Arranged: NA HH Agency: NA        Prior Living Arrangements/Services Living arrangements for the past 2 months: Apartment Lives with:: Self Patient language and need for interpreter reviewed:: Yes Do you feel safe going back to the place where you live?: Yes      Need for Family Participation in Patient Care: Yes (Comment) Care giver support system in place?: Yes (comment) Current home services: DME (cane) Criminal Activity/Legal Involvement Pertinent to Current Situation/Hospitalization: No - Comment as needed  Activities of Daily Living      Permission Sought/Granted   Permission granted to share information with : No              Emotional  Assessment Appearance:: Appears stated age Attitude/Demeanor/Rapport: Engaged Affect (typically observed): Appropriate Orientation: : Oriented to Self, Oriented to Place, Oriented to  Time, Oriented to Situation Alcohol / Substance Use: Not Applicable Psych Involvement: No (comment)  Admission diagnosis:  Encounter for management of vacuum-assisted closure (VAC) of wound [Z46.89] Patient Active Problem List   Diagnosis Date Noted   Encounter for management of vacuum-assisted closure (VAC) of wound 10/13/2024   Ileostomy present (HCC) 07/27/2024   Osteopenia 07/16/2024   Nonhealing surgical wound, subsequent encounter 07/09/2024   Surgical wound, non healing 06/23/2024   Protein-calorie malnutrition, severe 06/11/2024   Intra-abdominal free air of unknown etiology 05/28/2024   Nausea and vomiting 05/28/2024   QT prolongation 05/28/2024   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   Adjustment disorder with mixed anxiety and depressed mood 05/26/2024   Malnutrition of moderate degree 04/23/2024   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   Enteritis 04/12/2024   History of partial colectomy 04/11/2024   Generalized abdominal pain 04/11/2024   Colon cancer screening 04/11/2024   Abdominal wall dehiscence 04/09/2024   Perioperative dehiscence of abdominal wound with evisceration 04/03/2024   Ileostomy in place [facility]) 03/30/2024   Ileostomy care (HCC) 03/20/2024   Irritant contact dermatitis associated with fecal stoma 03/20/2024   Spontaneous perforation of colon (HCC) 01/15/2024   Ascites 01/15/2024  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 01/15/2024   Acute blood loss anemia 01/14/2024   Sepsis (HCC) 01/11/2024   AKI (acute kidney injury) 01/11/2024   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   Thrombocytopenia 07/13/2023   Pulmonary hypertension (HCC) 07/12/2023   Cor pulmonale (HCC) 07/12/2023   Noncompliance 01/22/2020   High risk medication use 10/30/2019    Hyperlipidemia 10/30/2019   Hepatic steatosis 10/08/2019   Spinal stenosis 10/08/2019   Aortic atherosclerosis 10/08/2019   Peripheral arterial disease 10/08/2019   Sleep disturbance 10/08/2019   Diverticulosis 10/08/2019   Essential hypertension, benign 09/28/2019   Gout 09/28/2019   History of pulmonary embolism 04/20/2019   Chronic pain of right ankle 12/12/2016   Chronic foot pain, right 11/02/2012   PCP:  [name], PA Pharmacy:   [facility] [address], [place] - [address] [place] [address] Phone: [phone] Fax: [phone]     Social Drivers of Health (SDOH) Social History: SDOH Screenings   Food Insecurity: No Food Insecurity (09/14/2024)  Housing: Low Risk (09/14/2024)  Transportation Needs: No Transportation Needs (09/14/2024)  Utilities: Not At Risk (05/28/2024)  Depression (PHQ2-9): Low Risk (09/14/2024)  Financial Resource Strain: Low Risk  (05/01/2024)   Received from [facility]  Social Connections: Moderately Isolated (05/01/2024)   Received from [facility]  Stress: No Stress Concern Present (05/13/2024)   Received from [facility]  Tobacco Use: Medium Risk (10/13/2024)   SDOH Interventions:     Readmission Risk Interventions    05/28/2024    2:04 PM 04/30/2024   12:54 PM 04/10/2024    1:55 PM  Readmission Risk Prevention Plan  Transportation Screening Complete Complete Complete  Medication Review [employer]) Complete Complete Complete  PCP or Specialist appointment within 3-5 days of discharge Complete Complete Complete  HRI or Home Care Consult Complete Complete Complete  SW Recovery Care/Counseling Consult Complete Complete Complete  Palliative Care Screening Not Applicable Not Applicable Not Applicable  Skilled Nursing Facility Complete Not Applicable Not Applicable

## 2024-10-16 MED ORDER — PANTOPRAZOLE SODIUM 40 MG PO TBEC
40.0000 mg | DELAYED_RELEASE_TABLET | Freq: Every day | ORAL | Status: DC
  Administered 2024-10-16 – 2024-10-22 (×7): 40 mg via ORAL
  Filled 2024-10-16 (×7): qty 1

## 2024-10-16 NOTE — Consult Note (Signed)
 WOC Nurse ostomy follow up Stoma type/location: RLQ ileostomy with midline abdominal wound Stomal assessment/size: 1 3/8 pink and moist  productive of soft green stool  Peristomal assessment: creasing at 3 o'clock.  Skin intact but pink in areas of previous breakdown without return of melanin.  Powder and skin prep to peristomal skin.  Small piece of barrier ring to crease remainer of ring around stoma.  Second layer of barrier ring pouch applied  Barrier strips and pink tape to perimeter.   Treatment options for stomal/peristomal skin: stoma powder and skin prep barrier ring 1 piece convex pouch  Output soft green stool  Ostomy pouching: 1pc coloplast nonformulary pouch  PATIENT HAS ADDITIONAL POUCH AT BEDSIDE IN CASE OF LEAK>  Education provided: I have left powder, skin prep, barrier rings barrier extenders pink tape and 1 piece pouch in drawer at bedside.  Patient knows how to do pouch change, he is independent at home.He can assist staff with pouch change if leaking.  Will see again Monday for pouch change.  Darice Cooley MSN, RN, FNP-BC CWON Wound, Ostomy, Continence Nurse Outpatient [facility] [phone] Work cell phone:  [phone]

## 2024-10-16 NOTE — Plan of Care (Signed)

## 2024-10-16 NOTE — Plan of Care (Signed)

## 2024-10-16 NOTE — Progress Notes (Signed)
"   °  Subjective: Doing well this morning.  No pain. Stoma bag leaking into wound vac and into the skin graft wound today.  Objective: Vital signs in last 24 hours: Temp:  [97.5 F (36.4 C)-98.1 F (36.7 C)] 97.5 F (36.4 C) (01/16 1003) Pulse Rate:  [66-80] 66 (01/16 1003) Resp:  [15-18] 15 (01/16 1003) BP: (109-127)/(66-80) 116/76 (01/16 1003) SpO2:  [96 %-97 %] 96 % (01/16 1003) Last BM Date : 10/15/24  Intake/Output from previous day: 01/15 0701 - 01/16 0700 In: 1120 [P.O.:1120] Out: 700 [Urine:250; Stool:450] Intake/Output this shift: Total I/O In: -  Out: 50 [Stool:50]  Patient alert oriented x 3 Hemodynamically normal Abdomen soft nontender nondistended.  Stoma pink and viable.  Wound VAC taken down due to contamination, wound cleaned with Vashe, and new wound VAC reapplied. No leaks holding suction.  Lab Results:  CBC    Component Value Date/Time   WBC 5.8 10/14/2024 0434   RBC 3.38 (L) 10/14/2024 0434   HGB 11.2 (L) 10/14/2024 0434   HGB 14.0 04/27/2019 0000   HCT 34.2 (L) 10/14/2024 0434   HCT 40.1 04/27/2019 0000   PLT 283 10/14/2024 0434   PLT 277 04/27/2019 0000   MCV 101.2 (H) 10/14/2024 0434   MCV 102 (H) 04/27/2019 0000   MCH 33.1 10/14/2024 0434   MCHC 32.7 10/14/2024 0434   RDW 16.5 (H) 10/14/2024 0434   RDW 13.1 04/27/2019 0000   LYMPHSABS 1.5 06/24/2024 1211   LYMPHSABS 1.4 04/27/2019 0000   MONOABS 0.5 06/24/2024 1211   EOSABS 0.0 06/24/2024 1211   EOSABS 0.1 04/27/2019 0000   BASOSABS 0.0 06/24/2024 1211   BASOSABS 0.0 04/27/2019 0000   Anti-infectives: Anti-infectives (From admission, onward)    Start     Dose/Rate Route Frequency Ordered Stop   10/13/24 2230  ceFAZolin  (ANCEF ) IVPB 2g/100 mL premix        2 g 200 mL/hr over 30 Minutes Intravenous Every 8 hours 10/13/24 2228 10/20/24 2159       Assessment/Plan: 64 year old male with history of an abdominal wound. He underwent split thickness graft to the abdominal wound,  application of skin substitute to the donor site and application of wound VAC earlier 10/13/2024.  Patient had issues with wound VAC at home, the wound VAC was peeling off and leaking.  Patient was instructed to come to the emergency department for further evaluation and possible admission for wound [facility] management as inpatient.  Patient will remain admitted until wound VAC takedown.  Due to the fact that we needed to change the wound VAC and some parts of the skin graft got the attached from the wound bed we will keep this new wound VAC for approximately 4 to 5 days.  Nurses instructed to notify plastic surgery team on-call if any issues like the 1 from today happen. Okay to have regular diet. Stoma nurse for stoma management. Home medications resumed.  Home anticoagulation resumed. All questions answered to patient satisfaction.   LOS: 3 days   Demetruis Depaul M Abijah Roussel 10/16/2024  "

## 2024-10-16 NOTE — Consult Note (Signed)
 WOC team consulted for leaking wound vac.  WOC team is not managing this NPWT and plastic surgery on call team should be notified as per their note 10/16/2024.   Secure chat sent to primary nurse regarding above.   Thank you,    Powell Bar MSN, RN-BC, [employer]

## 2024-10-17 MED ORDER — IBUPROFEN 400 MG PO TABS: 400.0000 mg | ORAL_TABLET | Freq: Four times a day (QID) | ORAL | Status: DC | PRN

## 2024-10-17 MED ORDER — HYDROXYZINE HCL 10 MG PO TABS
5.0000 mg | ORAL_TABLET | Freq: Three times a day (TID) | ORAL | Status: DC | PRN
  Administered 2024-10-17 – 2024-10-23 (×13): 5 mg via ORAL
  Filled 2024-10-17 (×13): qty 1

## 2024-10-17 MED ORDER — IBUPROFEN 400 MG PO TABS
400.0000 mg | ORAL_TABLET | Freq: Four times a day (QID) | ORAL | Status: DC | PRN
  Administered 2024-10-17 – 2024-10-21 (×3): 400 mg via ORAL
  Filled 2024-10-17 (×3): qty 1

## 2024-10-17 NOTE — Progress Notes (Signed)
 Patient complaining that his back is itching. Observed where he has been scratching. Washed back and applied lotion. Also placed clean linens and pad and put on a clean gown. Gave PRN medication for itching.

## 2024-10-17 NOTE — Progress Notes (Signed)
"   °  Subjective: Doing well this morning.  No pain. Wound vac leaking this am. Reinforced.  Objective: Vital signs in last 24 hours: Temp:  [97.5 F (36.4 C)-98.7 F (37.1 C)] 98.7 F (37.1 C) (01/17 1107) Pulse Rate:  [73-81] 77 (01/17 1107) Resp:  [17] 17 (01/17 1107) BP: (104-119)/(65-74) 119/74 (01/17 1107) SpO2:  [97 %] 97 % (01/17 1107) Last BM Date : 10/15/24  Intake/Output from previous day: 01/16 0701 - 01/17 0700 In: 720 [P.O.:720] Out: 700 [Stool:700] Intake/Output this shift: Total I/O In: 120 [P.O.:120] Out: 550 [Urine:300; Stool:250]  Patient alert oriented x 3 Hemodynamically normal Abdomen soft nontender nondistended.  Stoma pink and viable.  Wound VAC revised and new tape reapplied. No leaks holding suction.  Lab Results:  CBC    Component Value Date/Time   WBC 5.8 10/14/2024 0434   RBC 3.38 (L) 10/14/2024 0434   HGB 11.2 (L) 10/14/2024 0434   HGB 14.0 04/27/2019 0000   HCT 34.2 (L) 10/14/2024 0434   HCT 40.1 04/27/2019 0000   PLT 283 10/14/2024 0434   PLT 277 04/27/2019 0000   MCV 101.2 (H) 10/14/2024 0434   MCV 102 (H) 04/27/2019 0000   MCH 33.1 10/14/2024 0434   MCHC 32.7 10/14/2024 0434   RDW 16.5 (H) 10/14/2024 0434   RDW 13.1 04/27/2019 0000   LYMPHSABS 1.5 06/24/2024 1211   LYMPHSABS 1.4 04/27/2019 0000   MONOABS 0.5 06/24/2024 1211   EOSABS 0.0 06/24/2024 1211   EOSABS 0.1 04/27/2019 0000   BASOSABS 0.0 06/24/2024 1211   BASOSABS 0.0 04/27/2019 0000   Anti-infectives: Anti-infectives (From admission, onward)    Start     Dose/Rate Route Frequency Ordered Stop   10/13/24 2230  ceFAZolin  (ANCEF ) IVPB 2g/100 mL premix        2 g 200 mL/hr over 30 Minutes Intravenous Every 8 hours 10/13/24 2228 10/20/24 2159       Assessment/Plan: 64 year old male with history of an abdominal wound. He underwent split thickness graft to the abdominal wound, application of skin substitute to the donor site and application of wound VAC earlier  10/13/2024.  Patient had issues with wound VAC at home, the wound VAC was peeling off and leaking.  Patient was instructed to come to the emergency department for further evaluation and possible admission for wound [facility] management as inpatient.  Patient will remain admitted until wound VAC takedown.  Due to the fact that we needed to change the wound VAC and some parts of the skin graft got the attached from the wound bed we will keep this new wound VAC for approximately 4 to 5 days.  Nurses instructed to notify plastic surgery team on-call if any issues happen. Education provided to patient and [employer]. Okay to have regular diet. Stoma nurse for stoma management. Home medications resumed.  Home anticoagulation resumed. All questions answered to patient satisfaction.   LOS: 4 days   Mutasim Tuckey M Tiya Schrupp 10/17/2024  "

## 2024-10-17 NOTE — Progress Notes (Addendum)
 Wound vac continues to alarm with leak, tried to find a leak but unsuccessful. Wound vac stopped. Called on call provider. Dr. Lowery called back and instructed to find and the leak. Also notified her about persistent itching, atarax  5mg  added prn TID.   Pt emptied his ostomy bag, 75ml out.  Washed back with water  & applied moisturizer on pts back due to itching.

## 2024-10-17 NOTE — Plan of Care (Addendum)
 Reinforced wound vac dressing prn.  Extra wound vac supplies ordered.  Pt empties his ostomy bag with assistance.  Abdominal pain controlled with current pain regimen.  Adequate UOP.   Problem: Education: Goal: Knowledge of General Education information will improve Description: Including pain rating scale, medication(s)/side effects and non-pharmacologic comfort measures Outcome: Progressing   Problem: Health Behavior/Discharge Planning: Goal: Ability to manage health-related needs will improve Outcome: Progressing   Problem: Clinical Measurements: Goal: Ability to maintain clinical measurements within normal limits will improve Outcome: Progressing Goal: Will remain free from infection Outcome: Progressing Goal: Diagnostic test results will improve Outcome: Progressing Goal: Respiratory complications will improve Outcome: Progressing Goal: Cardiovascular complication will be avoided Outcome: Progressing

## 2024-10-17 NOTE — Progress Notes (Signed)
 Got a good seal on wound vac. Applied some more tape over tracker

## 2024-10-18 NOTE — Progress Notes (Signed)
 Pt was very disrespectful to myself and the tech tonight. He was loud and cussing, giving demands and uncooperative. Tried to reason with him but it didn't help. Continued to provide care but techs assignment was switched with another tech for the remainder of the shift.

## 2024-10-18 NOTE — Progress Notes (Addendum)
"   °  Subjective: Wound vac started leaking again in the afternoon of 01/17.  Dr. Lowery saw patient, removed the vac and skin graft and applied Myriad Matrix to the wound. (See procedure note). Wound vac was reapplied and it is holding suction.  Objective: Vital signs in last 24 hours: Temp:  [97.9 F (36.6 C)-98.7 F (37.1 C)] 98.7 F (37.1 C) (01/18 1945) Pulse Rate:  [81-95] 95 (01/18 1945) Resp:  [16-18] 18 (01/18 1945) BP: (105-118)/(69-76) 113/71 (01/18 1945) SpO2:  [94 %-98 %] 98 % (01/18 1945) Last BM Date : 10/18/24  Intake/Output from previous day: 01/17 0701 - 01/18 0700 In: 600 [P.O.:600] Out: 2075 [Urine:850; Stool:1225] Intake/Output this shift: No intake/output data recorded.  Patient alert oriented x 3 Hemodynamically normal Abdomen soft nontender nondistended.  Stoma pink and viable.  Wound VAC with no leaks holding suction. Donor site with dressings in place.  Lab Results:  CBC    Component Value Date/Time   WBC 5.8 10/14/2024 0434   RBC 3.38 (L) 10/14/2024 0434   HGB 11.2 (L) 10/14/2024 0434   HGB 14.0 04/27/2019 0000   HCT 34.2 (L) 10/14/2024 0434   HCT 40.1 04/27/2019 0000   PLT 283 10/14/2024 0434   PLT 277 04/27/2019 0000   MCV 101.2 (H) 10/14/2024 0434   MCV 102 (H) 04/27/2019 0000   MCH 33.1 10/14/2024 0434   MCHC 32.7 10/14/2024 0434   RDW 16.5 (H) 10/14/2024 0434   RDW 13.1 04/27/2019 0000   LYMPHSABS 1.5 06/24/2024 1211   LYMPHSABS 1.4 04/27/2019 0000   MONOABS 0.5 06/24/2024 1211   EOSABS 0.0 06/24/2024 1211   EOSABS 0.1 04/27/2019 0000   BASOSABS 0.0 06/24/2024 1211   BASOSABS 0.0 04/27/2019 0000   Anti-infectives: Anti-infectives (From admission, onward)    Start     Dose/Rate Route Frequency Ordered Stop   10/13/24 2230  ceFAZolin  (ANCEF ) IVPB 2g/100 mL premix        2 g 200 mL/hr over 30 Minutes Intravenous Every 8 hours 10/13/24 2228 10/20/24 2159       Assessment/Plan: 64 year old male with history of an abdominal  wound. He underwent split thickness graft to the abdominal wound, application of skin substitute to the donor site and application of wound VAC earlier 10/13/2024.  Patient had issues with wound VAC at home, the wound VAC was peeling off and leaking.  Patient was instructed to come to the emergency department for further evaluation and possible admission for wound [facility] management as inpatient. Multiple wound vac leaks,  Dr. Lowery saw patient on 10/17/2024 in the afternoon, removed the vac and skin graft and applied Myriad Matrix to the wound. Wound vac was reapplied it is holding suction.   Patient now has Myriad Matrix on wound, will remain admitted until wound VAC takedown.  Nurses instructed to notify plastic surgery team on-call if any issues happen. Education provided to patient and [employer]. Regular diet. Stoma nurse for stoma management. Home medications resumed.  Home anticoagulation resumed. All questions answered to patient satisfaction.   LOS: 5 days   Luan Urbani M Challis Crill 10/18/2024  "

## 2024-10-18 NOTE — Plan of Care (Signed)
   Problem: Education: Goal: Knowledge of General Education information will improve Description: Including pain rating scale, medication(s)/side effects and non-pharmacologic comfort measures Outcome: Progressing   Problem: Activity: Goal: Risk for activity intolerance will decrease Outcome: Progressing   Problem: Nutrition: Goal: Adequate nutrition will be maintained Outcome: Progressing

## 2024-10-19 ENCOUNTER — Ambulatory Visit (HOSPITAL_COMMUNITY): Admitting: Nurse Practitioner

## 2024-10-19 NOTE — Plan of Care (Signed)

## 2024-10-19 NOTE — Consult Note (Signed)
 WOC Nurse ostomy follow up Stoma type/location: RLQ ileostomy withmidline abdominal wound.  VAC leaked over weekend. Myriad was placed by plastics team with Ioban.  Seal holding at -125 mmHg,  Pouch change today for ileostomy  Stomal assessment/size: 1 3/8 pink and moist productive of liquid green stool  Peristomal assessment: midline abdominal wound with VAC intact.  Myriad to wound bed, Ioban to secure dressing.  Able to remove Ileostomy pouch and not effect integrity of VAC dressing.  Treatment options for stomal/peristomal skin: stoma powder and skin prep  Barrier ring piece to creasing at 3 o'clock and around stoma (x 2 layers wide)  Output liquid green stool  Ostomy pouching: 1pc. Convex coloplast pouch with barrier strips and reinforced with pink tape.   Education provided: pouch change performed.   2 additional pouch sets and all accessories at bedside in drawer by the bed. Ideally, pouch and VAC changed together next time VAC is changed.  Otherwise change pouch every 3 days.  Will follow.   Darice Cooley MSN, RN, FNP-BC CWON Wound, Ostomy, Continence Nurse Outpatient [facility] [phone] Work cell phone:  [phone]

## 2024-10-19 NOTE — Progress Notes (Signed)
 The patient had a VAC leak.  The VAC was removed from the abdomen.  There was loss of some of the skin graft.  Myriad sheet 7 x 10 cm was placed.  Sorbact was placed over the myriad and KY placed.  The vac was applied and there was an excellent seal.

## 2024-10-19 NOTE — Progress Notes (Signed)
"   °  Subjective: Wound vac is holding suction. No issues reported overnight.  Objective: Vital signs in last 24 hours: Temp:  [98 F (36.7 C)-98.7 F (37.1 C)] 98.3 F (36.8 C) (01/19 1949) Pulse Rate:  [86-92] 92 (01/19 1949) Resp:  [17-19] 18 (01/19 1949) BP: (103-122)/(72-78) 121/78 (01/19 1949) SpO2:  [94 %-99 %] 97 % (01/19 1949) Last BM Date : 10/18/24  Intake/Output from previous day: 01/18 0701 - 01/19 0700 In: 830 [P.O.:830] Out: 1340 [Urine:200; Drains:210; Stool:930] Intake/Output this shift: No intake/output data recorded.  Patient alert oriented x 3 Hemodynamically normal Abdomen soft nontender nondistended.  Stoma pink and viable.  Wound VAC with no leaks holding suction. Donor site with dressings in place.  Lab Results:  CBC    Component Value Date/Time   WBC 5.8 10/14/2024 0434   RBC 3.38 (L) 10/14/2024 0434   HGB 11.2 (L) 10/14/2024 0434   HGB 14.0 04/27/2019 0000   HCT 34.2 (L) 10/14/2024 0434   HCT 40.1 04/27/2019 0000   PLT 283 10/14/2024 0434   PLT 277 04/27/2019 0000   MCV 101.2 (H) 10/14/2024 0434   MCV 102 (H) 04/27/2019 0000   MCH 33.1 10/14/2024 0434   MCHC 32.7 10/14/2024 0434   RDW 16.5 (H) 10/14/2024 0434   RDW 13.1 04/27/2019 0000   LYMPHSABS 1.5 06/24/2024 1211   LYMPHSABS 1.4 04/27/2019 0000   MONOABS 0.5 06/24/2024 1211   EOSABS 0.0 06/24/2024 1211   EOSABS 0.1 04/27/2019 0000   BASOSABS 0.0 06/24/2024 1211   BASOSABS 0.0 04/27/2019 0000   Anti-infectives: Anti-infectives (From admission, onward)    Start     Dose/Rate Route Frequency Ordered Stop   10/13/24 2230  ceFAZolin  (ANCEF ) IVPB 2g/100 mL premix        2 g 200 mL/hr over 30 Minutes Intravenous Every 8 hours 10/13/24 2228 10/20/24 2159       Assessment/Plan: 64 year old male with history of an abdominal wound. He underwent split thickness graft to the abdominal wound, application of skin substitute to the donor site and application of wound VAC earlier 10/13/2024.   Patient had issues with wound VAC at home, the wound VAC was peeling off and leaking.  Patient was instructed to come to the emergency department for further evaluation and possible admission for wound [facility] management as inpatient. Multiple wound vac leaks,  Dr. Lowery saw patient on 10/17/2024 in the afternoon, removed the vac and the unincorporated skin graft and applied Myriad Matrix to the wound. Wound vac was reapplied and it is holding suction.   Patient now has Myriad Matrix on wound, will remain admitted until wound VAC takedown Friday.  Nurses instructed to notify plastic surgery team on-call if any issues happen. Education provided to patient and [employer]. Regular diet. Stoma nurse for stoma management. Home medications resumed.  Home anticoagulation resumed. We had a long conversation regarding wound care after wound vac take down. Patient will be educated on how to perform it before discharge. All questions answered to patient satisfaction.   LOS: 6 days   Tysheena Ginzburg M Makensie Mulhall 10/19/2024  "

## 2024-10-20 NOTE — Progress Notes (Signed)
 Overnight patient was verbally abusive to staff including unit secretary over the call bell. He was shouting at staff, and using disrespectful language during interactions. Patient raised his voice repeatedly and was uncooperative with redirection attempts. Staff attempted to de-escalate situation using calm communication and clear boundaries. Patient continues to shout despite interventions stating all staff is *explicit language* and I should get *explicit out of his room. Pt made request for this writer to never be  assigned to him.

## 2024-10-20 NOTE — Progress Notes (Signed)
"   °  Subjective: Patient is a 64 year old male with history of an abdominal wound. He underwent split thickness graft to the abdominal wound, application of skin substitute to the donor site and application of wound VAC with Dr. Montorfano on 10/13/2024.  Prevena wound VAC failed at home and patient was admitted for observation and wound VAC placement over the skin graft.   Today, patient reports he is doing okay.  He denies any issues or concerns with his wound VAC.  Objective: Vital signs in last 24 hours: Temp:  [98 F (36.7 C)-98.3 F (36.8 C)] 98.1 F (36.7 C) (01/20 0800) Pulse Rate:  [80-92] 80 (01/20 0800) Resp:  [17-18] 18 (01/20 0800) BP: (107-125)/(71-82) 125/82 (01/20 0800) SpO2:  [94 %-97 %] 96 % (01/20 0800) Last BM Date : 10/19/24 (ostomy output)  Intake/Output from previous day: 01/19 0701 - 01/20 0700 In: 1260 [P.O.:1260] Out: 2790 [Urine:1750; Drains:40; Stool:1000] Intake/Output this shift: No intake/output data recorded.  General appearance: alert, cooperative, and no distress Resp: Unlabored breathing, no respiratory distress GI: Wound VAC is in place.  No leak is noted on exam.  Adequate suction is noted at 125 mmHg.  Ostomy bag is in place.  Surrounding dressings appear to be clean dry and intact. Extremities: Sorbact is in place over the donor site to the right thigh.  There is no surrounding erythema.  No signs of infection.  Lab Results:     Latest Ref Rng & Units 10/14/2024    4:34 AM 10/13/2024   11:55 AM 06/24/2024   12:11 PM  CBC  WBC 4.0 - 10.5 K/uL 5.8  5.3  6.0   Hemoglobin 13.0 - 17.0 g/dL 88.7  87.7  88.8   Hematocrit 39.0 - 52.0 % 34.2  38.2  33.8   Platelets 150 - 400 K/uL 283  272  404.0     BMET No results for input(s): NA, K, CL, CO2, GLUCOSE, BUN, CREATININE, CALCIUM  in the last 72 hours. PT/INR No results for input(s): LABPROT, INR in the last 72 hours. ABG No results for input(s): PHART, HCO3 in the last 72  hours.  Invalid input(s): PCO2, PO2  Studies/Results: No results found.  Anti-infectives: Anti-infectives (From admission, onward)    Start     Dose/Rate Route Frequency Ordered Stop   10/13/24 2230  ceFAZolin  (ANCEF ) IVPB 2g/100 mL premix        2 g 200 mL/hr over 30 Minutes Intravenous Every 8 hours 10/13/24 2228 10/20/24 2159       Assessment/Plan: s/p skin graft Plan: -Patient to continue with wound VAC at 125 mmHg.  Keep the area clean and dry.  Given recent myriad placement, we will plan to take the wound VAC down on Friday. -Recommend applying Vashe soaked gauze to the donor site over the Sorbact for 5 minutes daily.  Gauze can be removed and then K-Y jelly applied over the Sorbact and then honeycomb dressing.  Change daily. -Wound nurse to manage ostomy site.  LOS: 7 days    Estefana FORBES Peck, PA-C 10/20/2024  "

## 2024-10-21 ENCOUNTER — Encounter

## 2024-10-21 NOTE — Progress Notes (Signed)
"   °  Subjective: Patient is a 64 year old male with history of an abdominal wound. He underwent split thickness graft to the abdominal wound, application of skin substitute to the donor site and application of wound VAC with Dr. Montorfano on 10/13/2024. Prevena wound VAC failed at home and patient was admitted for observation and wound VAC placement over the skin graft.   Upon entering the room today, patient is resting comfortably in bed.  He denies any new issues or concerns.  Objective: Vital signs in last 24 hours: Temp:  [98.1 F (36.7 C)-98.5 F (36.9 C)] 98.3 F (36.8 C) (01/21 0341) Pulse Rate:  [80-92] 87 (01/21 0341) Resp:  [15-18] 15 (01/20 1748) BP: (118-125)/(75-82) 118/80 (01/21 0341) SpO2:  [94 %-96 %] 94 % (01/21 0341) Last BM Date : 10/20/24  Intake/Output from previous day: 01/20 0701 - 01/21 0700 In: 1040 [P.O.:1040] Out: 3395 [Urine:1250; Drains:50; Stool:2095] Intake/Output this shift: No intake/output data recorded.  General appearance: alert, cooperative, and no distress Resp: Unlabored breathing, no respiratory distress GI: Wound VAC is in place at 125 mmHg, sponge and drapes appear to be clean dry and intact.  There is no leak noted on exam.  Ostomy bag is just in place just lateral of the wound VAC.  Surrounding dressings appear to be clean dry and intact.  Extremities: Dressings over the donor site appear to be clean dry and intact  Lab Results:     Latest Ref Rng & Units 10/14/2024    4:34 AM 10/13/2024   11:55 AM 06/24/2024   12:11 PM  CBC  WBC 4.0 - 10.5 K/uL 5.8  5.3  6.0   Hemoglobin 13.0 - 17.0 g/dL 88.7  87.7  88.8   Hematocrit 39.0 - 52.0 % 34.2  38.2  33.8   Platelets 150 - 400 K/uL 283  272  404.0     BMET No results for input(s): NA, K, CL, CO2, GLUCOSE, BUN, CREATININE, CALCIUM  in the last 72 hours. PT/INR No results for input(s): LABPROT, INR in the last 72 hours. ABG No results for input(s): PHART, HCO3 in the  last 72 hours.  Invalid input(s): PCO2, PO2  Studies/Results: No results found.  Anti-infectives: Anti-infectives (From admission, onward)    Start     Dose/Rate Route Frequency Ordered Stop   10/13/24 2230  ceFAZolin  (ANCEF ) IVPB 2g/100 mL premix        2 g 200 mL/hr over 30 Minutes Intravenous Every 8 hours 10/13/24 2228 10/20/24 1328       Assessment/Plan: s/p skin graft Plan: -Patient to continue with wound VAC at 125 mmHg.  Keep the area clean and dry.  Given recent myriad placement, we will plan to take the wound VAC down on Friday. -Regular diet -Patient taking home eliquis   -Wound nurse to manage ostomy site.  LOS: 8 days    Estefana FORBES Peck, PA-C 10/21/2024  "

## 2024-10-21 NOTE — Consult Note (Signed)
" °  CLINICAL SUPPORT TEAM - WOUND OSTOMY AND CONTINENCE TEAM  CONSULTATION SERVICES   WOC Nurse-Inpatient Note  WOC Nurse ostomy follow up Stoma type/location: RMQ ileostomy Pouch is intact from Monday.  VAC is scheduled to be taken down Friday with plastics team.  Will change ostomy pouch Thursday per patient request.   Will follow.  Darice Cooley MSN, RN, FNP-BC CWON Wound, Ostomy, Continence Nurse Outpatient [facility] [phone] Work cell phone:  [phone]   "

## 2024-10-21 NOTE — Progress Notes (Signed)
 Right thigh wound cleaned and dressing changed per order. Small amount of serosanguinous drainage. Green mesh left in place.

## 2024-10-21 NOTE — Plan of Care (Signed)
" °  Problem: Education: Goal: Knowledge of General Education information will improve Description: Including pain rating scale, medication(s)/side effects and non-pharmacologic comfort measures Outcome: Progressing   Problem: Health Behavior/Discharge Planning: Goal: Ability to manage health-related needs will improve Outcome: Not Progressing   Problem: Activity: Goal: Risk for activity intolerance will decrease Outcome: Progressing   Problem: Nutrition: Goal: Adequate nutrition will be maintained Outcome: Progressing   "

## 2024-10-22 NOTE — Progress Notes (Signed)
"   °  Subjective:  Patient is a 64 year old male with history of an abdominal wound. He underwent split thickness graft to the abdominal wound, application of skin substitute to the donor site and application of wound VAC with Dr. Montorfano on 10/13/2024. Prevena wound VAC failed at home and patient was admitted for observation and wound VAC placement over the skin graft.      Today, patient reports he is doing well.  He does not report any new issues or concerns. Objective: Vital signs in last 24 hours: Temp:  [97.7 F (36.5 C)-98.6 F (37 C)] 98.4 F (36.9 C) (01/22 1132) Pulse Rate:  [88-95] 95 (01/22 1132) Resp:  [17-18] 18 (01/22 1132) BP: (114-121)/(72-81) 117/73 (01/22 1132) SpO2:  [94 %-96 %] 95 % (01/22 1132) Last BM Date : 10/22/24  Intake/Output from previous day: 01/21 0701 - 01/22 0700 In: 240 [P.O.:240] Out: 1900 [Urine:400; Drains:100; Stool:1400] Intake/Output this shift: No intake/output data recorded.  General appearance: alert, cooperative, and no distress Resp: Unlabored breathing, no respiratory distress GI: Abdomen is soft.  Wound VAC is in place over the wound and is functioning adequately at 125 mmHg.  There are no leaks noted on exam.  Ostomy bag is in place and dressings surrounding the ostomy bag are clean dry and intact. Right lower extremity: Donor site dressing appears to be clean dry and intact.  Lab Results:     Latest Ref Rng & Units 10/14/2024    4:34 AM 10/13/2024   11:55 AM 06/24/2024   12:11 PM  CBC  WBC 4.0 - 10.5 K/uL 5.8  5.3  6.0   Hemoglobin 13.0 - 17.0 g/dL 88.7  87.7  88.8   Hematocrit 39.0 - 52.0 % 34.2  38.2  33.8   Platelets 150 - 400 K/uL 283  272  404.0     BMET No results for input(s): NA, K, CL, CO2, GLUCOSE, BUN, CREATININE, CALCIUM  in the last 72 hours. PT/INR No results for input(s): LABPROT, INR in the last 72 hours. ABG No results for input(s): PHART, HCO3 in the last 72 hours.  Invalid  input(s): PCO2, PO2  Studies/Results: No results found.  Anti-infectives: Anti-infectives (From admission, onward)    Start     Dose/Rate Route Frequency Ordered Stop   10/13/24 2230  ceFAZolin  (ANCEF ) IVPB 2g/100 mL premix        2 g 200 mL/hr over 30 Minutes Intravenous Every 8 hours 10/13/24 2228 10/20/24 1328       Assessment/Plan: s/p skin graft Plan: Plan: -Patient to continue with wound VAC at 125 mmHg.  Keep the area clean and dry.  Given recent myriad placement, we will plan to take the wound VAC down on tomorrow -Regular diet -Patient taking home eliquis   -Wound nurse to manage ostomy site.  LOS: 9 days    Estefana FORBES Peck, PA-C 10/22/2024  "

## 2024-10-22 NOTE — Plan of Care (Signed)
  Problem: Pain Managment: Goal: General experience of comfort will improve and/or be controlled Outcome: Progressing   Problem: Safety: Goal: Ability to remain free from injury will improve Outcome: Progressing   Problem: Skin Integrity: Goal: Risk for impaired skin integrity will decrease Outcome: Progressing

## 2024-10-22 NOTE — Plan of Care (Signed)

## 2024-10-22 NOTE — Progress Notes (Signed)
 Patient's left arm was itching, and appeared red and a bit swollen. Pulled the IV and notified the IV team to take a look at it. Marcianne, RN from the IV team came up and it was determined that it was a reaction to the IV dressing. Patient stated to her that he had had a reaction like that in the past. Advised that patient ask for a hypoallergenic dressing in the future. As patient is not having any infusions at this time, no new IV site was obtained.

## 2024-10-22 NOTE — Progress Notes (Signed)
 Pt reports itching around IV site that has improved since being remove. Discussed with pt for future IV access to request hypoallergenic dressing. Pt states understanding. At this time there is no need for PIV access. Unit nurse at bedside and aware; will consult IV team if needed.

## 2024-10-22 NOTE — Consult Note (Signed)
 WOC Nurse ostomy follow up Stoma type/location: RLQ ileostomy  Pouch change with midline VAC in place.   Stomal assessment/size: 1 3/8 budded pink and producing liquid green stool  Peristomal assessment: creasing at 3 o'clock that extends towards midline incision.  This is filled in with barrier strip.  Small loss of suction noted (from 125 to 100) and is immediately returned to 125 with gentle pressure in the abdominal crease.  I add an extra strip of VAC drape along the side and over the crease and seal is achieved and maintained.  Dressing has not had loss of firmness.  No pooling of effluent and no itching or burning.  Ostomy pouch was intact when I removed it.  Treatment options for stomal/peristomal skin: Stoma powder and skin prep   barrier ring to crease and 2 layers of barrier ring around stoma.  1 piece convex pouch with barrier strips and pink (waterproof) tape to perimeter.  Output liquid green stool  Ostomy pouching: 1pc. Coloplast convex Education provided: Supplies in bedside drawer for 2 pouch changes.  Patient understands he can walk the nurse through the steps to change pouch if it begins leaking.  Plastics to see tomorrow to takedown VAC if wound bed favorable.  Will follow.  Darice Cooley MSN, RN, FNP-BC CWON Wound, Ostomy, Continence Nurse [facility] [phone] Work cell phone:  [phone]

## 2024-10-23 ENCOUNTER — Telehealth: Payer: Self-pay

## 2024-10-23 MED ORDER — GABAPENTIN 300 MG PO CAPS: 300.0000 mg | ORAL_CAPSULE | Freq: Two times a day (BID) | ORAL | 0 refills | Status: AC

## 2024-10-23 MED ORDER — IBUPROFEN 600 MG PO TABS: 600.0000 mg | ORAL_TABLET | Freq: Three times a day (TID) | ORAL | 0 refills | Status: AC | PRN

## 2024-10-23 MED ORDER — ACETAMINOPHEN 500 MG PO TABS: 500.0000 mg | ORAL_TABLET | Freq: Four times a day (QID) | ORAL | 0 refills | Status: AC | PRN

## 2024-10-23 NOTE — Discharge Instructions (Addendum)
 Activity  As tolerated: Can showers Can driving No heavy activities  Diet:regular No restrictions  Wound Care:  To the abdomen: Keep dressing clean & dry. Clean the wound with Vashe, cover with a piece of adaptic, KY Jelly and Mepilex Border Flex. Reinforce with tape PRN. Once daily and PRN.   To the right lower extremity donor site: Remove dressings. Leave green mesh (Sorbact) in place. Apply Vashe soaked gauze over the green mesh for 5 minutes. Remove the Vashe soaked gauze. Apply a small amount of K-Y jelly over the green mesh. Apply honeycomb dressing over the top of the green mesh. Change daily.   Special Instructions: Call Doctor if any unusual problems occur such as pain, excessive Bleeding, unrelieved Nausea/vomiting, Fever &/or chills When lying down, keep head elevated on 2-3 pillows or back-rest Follow-up appointment: Office will call you to coordinate.   The patient received discharge instruction from:___________________________________________   Patient signature ________________________________________ / Date___________    Signature of individual providing instructions/ Date________________

## 2024-10-23 NOTE — Progress Notes (Signed)
 Working on the supplies needed for home.

## 2024-10-23 NOTE — Telephone Encounter (Signed)
 Faxed the supply request to [employer]  with confirmed receipt.

## 2024-10-23 NOTE — Discharge Summary (Addendum)
 Physician Discharge Summary  Patient ID: Alexander [name] MRN: [id number] DOB/AGE: [birth year] 64 y.o.  Admit date: 10/13/2024 Discharge date: 10/23/2024  Admission Diagnoses:  Discharge Diagnoses:  Principal Problem:   Encounter for management of vacuum-assisted closure (VAC) of wound   Discharged Condition: good  Hospital Course:  The patient is a 64 year old male with a history of a chronic abdominal wound.  He is status post exploratory laparotomy, lysis of adhesions, ileostomy takedown (ileostomy to distal transverse colon anastomosis), sigmoid colectomy with diverting loop ileostomy, and mobilization of the splenic flexure performed on April 13, 2024 by Dr. Camellia Blush. 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 He underwent a split-thickness skin graft to the abdominal wound, application of a skin substitute to the donor site, and placement of a wound VAC on 10/13/2024 in an attempt to control this wound as he has had multiple wound infections in the past.  Following discharge, the Prevena wound VAC failed at home, and the patient was instructed to present to the emergency department for further evaluation and possible inpatient admission for wound VAC management. A stoma nurse was consulted due to difficulty with stoma pouching.  Due to continuous problems with wound VAC seal patient was admitted to the hospital for wound VAC management. The patient experienced multiple wound VAC leaks with stoma output leaking into the wound, that were addressed by the surgical team.  Dr. Lowery evaluated the patient on 10/17/2024 due to ongoing leakage. At that time, the wound VAC was removed along with unincorporated portions of the skin graft, and Myriad Matrix was applied to the wound. A wound VAC was subsequently reapplied.  The wound VAC was taken down today and demonstrated good granulation tissue and a healthy wound bed. Wound care teaching was  performed, including instructions for use of Adaptic, KY gel, and sterile dressings. The stoma nurse was contacted to change the pouch prior to discharge.  Patient was allowed to ask questions, and all questions were answered to patient satisfaction. The patient will be discharged home in the afternoon with necessary wound care supplies and will follow up in clinic early next week for wound evaluation.  Consults: Stoma nurse  Treatments: Wound vac care  Discharge Exam: Blood pressure 130/81, pulse 96, temperature 98.5 F (36.9 C), temperature source Oral, resp. rate 18, height 5' 9 (1.753 m), weight 86.2 kg, SpO2 100%. Patient alert oriented x 3 Hemodynamically normal Abdomen soft nontender nondistended.  Stoma pink and viable. Abdominal wound with good granulation tissue and integrated Myriad Marix.    Disposition: Home There are no questions and answers to display.         Allergies as of 10/23/2024   No Known Allergies   Med Rec must be completed prior to using this [facility]    Follow-up Information     [name], [name]. Schedule an appointment as soon as possible for a visit.   Specialty: Physician Assistant Contact information: [address] [place] [address] [phone]         Andris Estefana BRAVO, PA-C Follow up in 5 day(s).   Specialty: Plastic Surgery Contact information: [address] [place] [address] [phone]         Stefano Trulson M, MD Follow up in 2 week(s).   Specialty: Plastic Surgery Why: For wound re-check Contact information: [address] [place] [address] [phone]  Signed: Zackariah Vanderpol M Neema Barreira 10/23/2024, 7:56 AM

## 2024-10-23 NOTE — TOC Progression Note (Signed)
 Transition of Care (TOC) - Progression Note   Patient for discharge today. Nursing providing wound care education and supplies including ostomy.   If patient needs cab , discharge lounge can provide  Patient Details  Name: Alexander [name] MRN: [id number] Date of Birth: [birth year]  Transition of Care [facility]) CM/SW Contact  Joreen Swearingin, Powell Jansky, RN Phone Number: 10/23/2024, 12:16 PM  Clinical Narrative:       Expected Discharge Plan: Home/Self Care Barriers to Discharge: Continued Medical Work up               Expected Discharge Plan and Services   Discharge Planning Services: CM Consult Post Acute Care Choice: NA Living arrangements for the past 2 months: Apartment                 DME Arranged: N/A DME Agency: NA       HH Arranged: NA HH Agency: NA         Social Drivers of Health (SDOH) Interventions SDOH Screenings   Food Insecurity: No Food Insecurity (10/15/2024)  Housing: Low Risk (10/20/2024)  Transportation Needs: No Transportation Needs (10/16/2024)  Utilities: Not At Risk (10/16/2024)  Depression (PHQ2-9): Low Risk (09/14/2024)  Financial Resource Strain: Low Risk  (05/01/2024)   Received from [facility]  Social Connections: Moderately Isolated (05/01/2024)   Received from [facility]  Stress: No Stress Concern Present (05/13/2024)   Received from [facility]  Tobacco Use: Medium Risk (10/13/2024)    Readmission Risk Interventions    05/28/2024    2:04 PM 04/30/2024   12:54 PM 04/10/2024    1:55 PM  Readmission Risk Prevention Plan  Transportation Screening Complete Complete Complete  Medication Review Oceanographer) Complete Complete Complete  PCP or Specialist appointment within 3-5 days of discharge Complete Complete Complete  HRI or Home Care Consult Complete Complete Complete  SW Recovery Care/Counseling Consult Complete Complete Complete  Palliative Care Screening Not Applicable Not Applicable Not Applicable  Skilled Nursing Facility  Complete Not Applicable Not Applicable

## 2024-10-23 NOTE — Consult Note (Signed)
 WOC Nurse ostomy follow up Patient is discharging home today.  VAC dressing removed by plastics team.  Ostomy pouch intact.  Discussed daily wound care with patient to midline abdomen (VASHE/adaptic/lubricating jelly) and right thigh (honeycomb dressing)  PRovided adhesive remover to assist with honeycomb dressing removal. Bedside RN provided wound care supplies  Ostomy supplies provided and patient had supplies at home. He is independent with ostomy care.  He will see me in clinic Tuesday and has appointment with plastics MD as well.  Stoma type/location: RLQ ileostomy  Stomal assessment/size  pink and moist   Peristomal assessment: intact Treatment options for stomal/peristomal skin: stoma powder skin prep barrier ring  Output liquid brown stool  Ostomy pouching: 1pc.convex coloplast pouch with barrier strips and waterproof pink tape Education provided:  See above.  Wound care, follow up appointments.  Will not follow at this time.  Please re-consult if needed.  WIll see in outpatient ostomy clinic Tuesday.  Darice Cooley MSN, RN, FNP-BC CWON Wound, Ostomy, Continence Nurse Outpatient [facility] [phone] Work cell phone:  [phone]

## 2024-10-27 ENCOUNTER — Ambulatory Visit (HOSPITAL_COMMUNITY)
Admit: 2024-10-27 | Discharge: 2024-10-27 | Disposition: A | Discharge status: Home / Self Care | Attending: *Deleted | Admitting: *Deleted

## 2024-10-27 ENCOUNTER — Ambulatory Visit

## 2024-10-27 DIAGNOSIS — Z79899 Other long term (current) drug therapy: Secondary | ICD-10-CM | POA: Diagnosis not present

## 2024-10-27 DIAGNOSIS — S31109D Unspecified open wound of abdominal wall, unspecified quadrant without penetration into peritoneal cavity, subsequent encounter: Secondary | ICD-10-CM

## 2024-10-27 DIAGNOSIS — Z7901 Long term (current) use of anticoagulants: Secondary | ICD-10-CM | POA: Insufficient documentation

## 2024-10-27 DIAGNOSIS — Z932 Ileostomy status: Secondary | ICD-10-CM | POA: Diagnosis present

## 2024-10-27 NOTE — Progress Notes (Signed)
" ° °  Established Patient Office Visit  Subjective   Patient ID: Alexander [name], male    DOB: 05-18-1961  Age: 64 y.o. MRN: [id number]  Chief Complaint  Patient presents with   post op    HPI  64 year old male with a history of a chronic abdominal wound.  He is status post exploratory laparotomy, lysis of adhesions, ileostomy takedown (ileostomy to distal transverse colon anastomosis), sigmoid colectomy with diverting loop ileostomy, and mobilization of the splenic flexure performed on April 13, 2024 by Dr. Camellia Blush. 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 He underwent a split-thickness skin graft to the abdominal wound, application of a skin substitute to the donor site, and placement of a wound VAC on 10/13/2024 in an attempt to control this wound as he has had multiple wound infections in the past. Prevena wound VAC failed at home and patient was admitted for observation and wound VAC placement over the skin graft. Dr. Lowery saw patient on 10/17/2024 in the afternoon, removed the vac and the unincorporated skin graft and applied Myriad Matrix to the wound.   Patient presents for follow-up.  States that he has been doing local wound care to the abdominal wound twice a day as instructed.  Donor site with dressing in place.  Per patient has not changed the dressings at this site.    Objective:     There were no vitals taken for this visit. BP Readings from Last 3 Encounters:  10/27/24 125/83  10/23/24 126/77  10/13/24 121/79    Physical Exam RN as chaperone Abdominal wound with exudate and bad odor.  This was cleaned with Vashe.  Abdominal wound showing some areas of healing with early epithelialization and some granulomatous changes as well.  Sterile dressings reapplied.  Donor site dressing taken down, Sorbact removed, the site is healing as expected, wound clean K-Y jelly applied and sterile dressing applied     Assessment &  Plan:   Problem List Items Addressed This Visit   None Visit Diagnoses       Open wound of abdominal wall, subsequent encounter    -  Primary      I explained to the patient that the wound is showing signs of healing, yet we discussed local wound care again, since the wound had exudate and bad odor on patient's arrival to today visit.  I instructed him to perform dressing changes twice a day and to keep the wound clean as this is very important for wound healing.  We also discussed donor site care.  New dressings were applied.  Patient requested supplies, as he states he has been unable to get them covered by insurance.  Supplies for a week provided for the patient.  Patient will follow-up with me or PA next week for wound check.  We will coordinate with stoma nurse for patient to be able to come to both appointments the same day, as he has transportation issues.  All questions answered to patient's satisfaction  Alexander CHRISTELLA Client, MD  "
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
## 2024-10-28 ENCOUNTER — Encounter

## 2024-10-28 ENCOUNTER — Telehealth: Payer: Self-pay | Admitting: *Deleted

## 2024-10-28 NOTE — Telephone Encounter (Signed)
 Copied from CRM [id number]. Topic: Clinical - Prescription Issue >> Oct 28, 2024 12:40 PM Mesmerise C wrote: Reason for CRM: Patient returning a call back to Alexander [name] read him the message  Please tell patient needs to take his new insurance card to the pharmacy so they can process his prescription Xarelto  correctly. Patient stated he contact [facility] and they stated it could take up to 4-5 hours and they're waiting for the doctor's office to call to give him an emergency supply to hold him over states he needs it done before 3 today.

## 2024-10-28 NOTE — Telephone Encounter (Signed)
 Copied from CRM #[id number]. Topic: Clinical - Prescription Issue >> Oct 27, 2024  4:19 PM Joesph B wrote: Reason for CRM: patient wants to know why his eliquis  is $300. I advised him to contact his ins company and he said he shouldn't have to. Please advise. He is down to his last pill.

## 2024-10-28 NOTE — Telephone Encounter (Signed)
 Spoke to pt told him he will need to contact his insurance to see why Eliquis  is so expensive and see if you need to change medication. Pt said he changed insurances and told pharmacy that he is with [facility] now. Told hm need to go to the pharmacy and give them your new insurance card and have them run it again and it still too much need to contact insurance. Also need to update insurance with us , we have [employer] yet. Told him to call back and give info to schedulers. Pt verbalized understanding.

## 2024-10-28 NOTE — Telephone Encounter (Signed)
 See other message

## 2024-10-28 NOTE — Telephone Encounter (Signed)
 Called [facility] and spoke to [place] [address], told her calling in regards to pt's Eliquis . Told her I told pt to bring his new insurance to you so you can run it correctly. Pt said you could give him and emergency supply till insurance runs. Glade said yes, but he will have to pay out of pocket, he just needs to bring his insurance card. Told her thank you I will let him know.

## 2024-10-28 NOTE — Telephone Encounter (Signed)
 Copied from CRM #[id number]. Topic: General - Other >> Oct 28, 2024  8:58 AM Rea ORN wrote: Reason for CRM: Pt called back and his insurance was updated to [facility]. Per nurse Luverta Korte's message, I advised pt to [facility] and update the insurance with them. If the price of the rx was still too high, I advised he call the clinic back. >> Oct 28, 2024  9:45 AM Ivette P wrote: Pt called in to see about having us  contact [facility] about their prescription. Pt was told by [facility] that they are not able to charge insurance with information provided.    Pt said he needs the prescription and would like for someone to follow up with them,  Per Elizaveta Mattice's last message. I advsied Pt that he should contact Insurance to see if insurance will be able to provide him with a pharmacy that he can go to that is under his insurnace. Pt would like a phone call and would like for someone to follow up with him regarding his medicaitons

## 2024-10-28 NOTE — Telephone Encounter (Signed)
 Left message on voicemail to call office. Please tell patient needs to take his new insurance card to the pharmacy so they can process his prescription Xarelto  correctly.

## 2024-10-28 NOTE — Telephone Encounter (Signed)
 Spoke to pt told him I just spoke to the pharmacist and was told you need to bring your new insurance card to the pharmacy so they can run the Rx correctly. Also they will give you an emergency supply, but you will have to pay out of pocket. Pt verbalized understanding and will go to the pharmacy, but he is saying the [facility] told him there is a hold or something on his card. Told him I don't know was just told they need your new insurance card. Pt verbalized understanding.

## 2024-10-30 NOTE — Discharge Instructions (Signed)
 Follow up weekly for wound and ostomy care.

## 2024-11-03 ENCOUNTER — Ambulatory Visit (HOSPITAL_COMMUNITY)
Admission: RE | Admit: 2024-11-03 | Discharge: 2024-11-03 | Disposition: A | Source: Ambulatory Visit | Attending: Physician Assistant | Admitting: Physician Assistant

## 2024-11-03 ENCOUNTER — Ambulatory Visit (HOSPITAL_COMMUNITY): Admitting: Nurse Practitioner

## 2024-11-03 DIAGNOSIS — Z932 Ileostomy status: Secondary | ICD-10-CM | POA: Insufficient documentation
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
## 2024-11-04 ENCOUNTER — Ambulatory Visit: Admitting: Student

## 2024-11-04 ENCOUNTER — Encounter: Payer: Self-pay | Admitting: Student

## 2024-11-04 VITALS — BP 122/75 | HR 89

## 2024-11-04 DIAGNOSIS — Z9889 Other specified postprocedural states: Secondary | ICD-10-CM

## 2024-11-04 DIAGNOSIS — S31109D Unspecified open wound of abdominal wall, unspecified quadrant without penetration into peritoneal cavity, subsequent encounter: Secondary | ICD-10-CM

## 2024-11-04 NOTE — Progress Notes (Signed)
 Patient is a 64 year old male with a history of a chronic abdominal wound.  He is status post exploratory laparotomy, lysis of adhesions, ileostomy takedown (ileostomy to distal transverse colon anastomosis), sigmoid colectomy with diverting loop ileostomy, and mobilization of the splenic flexure performed on April 13, 2024 by Dr. Camellia Blush. 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 He underwent a split-thickness skin graft to the abdominal wound, application of a skin substitute to the donor site, and placement of a wound VAC on 10/13/2024 in an attempt to control this wound as he has had multiple wound infections in the past. Prevena wound VAC failed at home and patient was admitted for observation and wound VAC placement over the skin graft. Dr. Lowery saw patient on 10/17/2024 in the afternoon, removed the vac and the unincorporated skin graft and applied Myriad Matrix to the wound.   Patient was last seen in the clinic on 10/27/2024.  At this visit, patient reported that he was doing local wound care to the abdominal wound twice daily.  Abdominal wound was noted to have exudate and bad odor.  The area was cleaned with Vashe.  Abdominal wound was showing some areas of healing with early epithelialization.  The donor site was healing well.  It was discussed with the patient that he needed to continue with twice daily dressing changes.  Today, patient reports he is doing okay.  He states that he is starting to run out of supplies.  He states that he never received the prism  order.  He reports that he has been applying what sounds like Xeroform over the wound twice daily.  He denies any other issues or concerns.  He does not report any fevers or chills.  Chaperone present on exam.  On exam, patient is sitting upright in no acute distress.  To the donor site to the right leg, wound does appear to be healing well.  There is some superficial scabbing.  This  wound is approximately 11 x 4 x 0.05 cm.  There are no signs of infection.  To the abdomen, wound is approximately 8 x 6 x 0.05 cm.  There does appear to be improved epithelialization at the borders.  There is granulation tissue noted throughout the wound with some exudate noted.  There is no surrounding erythema.  It does not appear to be infected.  There was an area of hypergranulation tissue noted to the 1 o'clock position of the wound.  The hypergranulation tissue was cauterized with silver nitrate and patient tolerated well.  Discussed with the patient the importance of twice daily dressing changes.  Discussed with him he needs to apply Vashe soaked gauze to the wound, remove these, apply Adaptic, K-Y jelly and a dressing over the top.  Discussed with him to apply Vaseline and a dressing daily over his donor site.  Patient expressed understanding.  Will check with prism  on patient supplies, donated supplies were given to patient today.  I discussed with the patient that he may have to get some of the supplies on his own.  Patient expressed understanding.  Patient to follow back up next week.  Instructed him to call in the meantime with any questions or concerns.  Pictures were obtained of the patient and placed in the chart with the patient's or guardian's permission.

## 2024-11-05 ENCOUNTER — Other Ambulatory Visit: Payer: Self-pay | Admitting: Physician Assistant

## 2024-11-05 DIAGNOSIS — T859XXD Unspecified complication of internal prosthetic device, implant and graft, subsequent encounter: Secondary | ICD-10-CM | POA: Insufficient documentation

## 2024-11-06 ENCOUNTER — Ambulatory Visit (HOSPITAL_COMMUNITY): Admitting: Nurse Practitioner

## 2024-11-09 ENCOUNTER — Encounter: Admitting: Student

## 2024-11-17 ENCOUNTER — Ambulatory Visit (HOSPITAL_COMMUNITY): Admitting: Nurse Practitioner

## 2024-11-18 ENCOUNTER — Encounter

## 2024-11-24 ENCOUNTER — Ambulatory Visit: Payer: Self-pay | Admitting: Cardiology

## 2025-01-06 ENCOUNTER — Encounter
